# Patient Record
Sex: Female | Born: 1935
Health system: Southern US, Community
[De-identification: ages and names within clinical notes are randomized; demographics above are authoritative.]

## PROBLEM LIST (undated history)

## (undated) DIAGNOSIS — I2510 Atherosclerotic heart disease of native coronary artery without angina pectoris: Secondary | ICD-10-CM

## (undated) DIAGNOSIS — G43909 Migraine, unspecified, not intractable, without status migrainosus: Secondary | ICD-10-CM

## (undated) DIAGNOSIS — I498 Other specified cardiac arrhythmias: Secondary | ICD-10-CM

## (undated) DIAGNOSIS — R011 Cardiac murmur, unspecified: Secondary | ICD-10-CM

## (undated) DIAGNOSIS — F329 Major depressive disorder, single episode, unspecified: Secondary | ICD-10-CM

## (undated) DIAGNOSIS — K5730 Diverticulosis of large intestine without perforation or abscess without bleeding: Secondary | ICD-10-CM

## (undated) DIAGNOSIS — I10 Essential (primary) hypertension: Secondary | ICD-10-CM

## (undated) DIAGNOSIS — F32A Depression, unspecified: Secondary | ICD-10-CM

## (undated) DIAGNOSIS — I130 Hypertensive heart and chronic kidney disease with heart failure and stage 1 through stage 4 chronic kidney disease, or unspecified chronic kidney disease: Secondary | ICD-10-CM

## (undated) DIAGNOSIS — E039 Hypothyroidism, unspecified: Secondary | ICD-10-CM

## (undated) DIAGNOSIS — R0602 Shortness of breath: Secondary | ICD-10-CM

## (undated) DIAGNOSIS — E785 Hyperlipidemia, unspecified: Secondary | ICD-10-CM

## (undated) DIAGNOSIS — M1990 Unspecified osteoarthritis, unspecified site: Secondary | ICD-10-CM

## (undated) DIAGNOSIS — N183 Chronic kidney disease, stage 3 unspecified: Secondary | ICD-10-CM

## (undated) DIAGNOSIS — D649 Anemia, unspecified: Secondary | ICD-10-CM

## (undated) DIAGNOSIS — Z5189 Encounter for other specified aftercare: Secondary | ICD-10-CM

## (undated) DIAGNOSIS — K219 Gastro-esophageal reflux disease without esophagitis: Secondary | ICD-10-CM

## (undated) DIAGNOSIS — M353 Polymyalgia rheumatica: Secondary | ICD-10-CM

## (undated) DIAGNOSIS — I6529 Occlusion and stenosis of unspecified carotid artery: Secondary | ICD-10-CM

## (undated) DIAGNOSIS — I209 Angina pectoris, unspecified: Secondary | ICD-10-CM

## (undated) DIAGNOSIS — Z8719 Personal history of other diseases of the digestive system: Secondary | ICD-10-CM

## (undated) DIAGNOSIS — I5030 Unspecified diastolic (congestive) heart failure: Secondary | ICD-10-CM

## (undated) DIAGNOSIS — E119 Type 2 diabetes mellitus without complications: Secondary | ICD-10-CM

## (undated) HISTORY — DX: Anemia, unspecified: D64.9

## (undated) HISTORY — DX: Cardiac murmur, unspecified: R01.1

## (undated) HISTORY — DX: Atherosclerotic heart disease of native coronary artery without angina pectoris: I25.10

## (undated) HISTORY — DX: Occlusion and stenosis of unspecified carotid artery: I65.29

## (undated) HISTORY — PX: CATARACT EXTRACTION W/ INTRAOCULAR LENS  IMPLANT, BILATERAL: SHX1307

## (undated) HISTORY — DX: Other specified cardiac arrhythmias: I49.8

## (undated) HISTORY — DX: Gastro-esophageal reflux disease without esophagitis: K21.9

## (undated) HISTORY — DX: Hypothyroidism, unspecified: E03.9

## (undated) HISTORY — DX: Diverticulosis of large intestine without perforation or abscess without bleeding: K57.30

## (undated) HISTORY — DX: Essential (primary) hypertension: I10

## (undated) HISTORY — DX: Hyperlipidemia, unspecified: E78.5

---

## 1969-07-19 DIAGNOSIS — Z8711 Personal history of peptic ulcer disease: Secondary | ICD-10-CM

## 1969-07-19 HISTORY — DX: Personal history of peptic ulcer disease: Z87.11

## 1970-11-18 DIAGNOSIS — IMO0001 Reserved for inherently not codable concepts without codable children: Secondary | ICD-10-CM

## 1970-11-18 DIAGNOSIS — Z5189 Encounter for other specified aftercare: Secondary | ICD-10-CM

## 1970-11-18 HISTORY — DX: Reserved for inherently not codable concepts without codable children: IMO0001

## 1970-11-18 HISTORY — DX: Encounter for other specified aftercare: Z51.89

## 1974-11-18 HISTORY — PX: VAGINAL HYSTERECTOMY: SUR661

## 1998-03-21 ENCOUNTER — Encounter: Admission: RE | Admit: 1998-03-21 | Discharge: 1998-03-21 | Payer: Self-pay | Admitting: Hematology and Oncology

## 1998-04-24 ENCOUNTER — Ambulatory Visit: Admission: RE | Admit: 1998-04-24 | Discharge: 1998-04-24 | Payer: Self-pay | Admitting: *Deleted

## 1998-06-21 ENCOUNTER — Encounter: Admission: RE | Admit: 1998-06-21 | Discharge: 1998-06-21 | Payer: Self-pay | Admitting: Hematology and Oncology

## 1998-07-21 ENCOUNTER — Encounter: Admission: RE | Admit: 1998-07-21 | Discharge: 1998-07-21 | Payer: Self-pay | Admitting: Internal Medicine

## 1998-07-28 ENCOUNTER — Encounter: Admission: RE | Admit: 1998-07-28 | Discharge: 1998-07-28 | Payer: Self-pay | Admitting: Internal Medicine

## 1999-06-08 ENCOUNTER — Encounter: Admission: RE | Admit: 1999-06-08 | Discharge: 1999-06-08 | Payer: Self-pay | Admitting: Internal Medicine

## 1999-07-13 ENCOUNTER — Encounter: Admission: RE | Admit: 1999-07-13 | Discharge: 1999-07-13 | Payer: Self-pay | Admitting: Internal Medicine

## 1999-08-22 ENCOUNTER — Encounter: Admission: RE | Admit: 1999-08-22 | Discharge: 1999-08-22 | Payer: Self-pay | Admitting: Hematology and Oncology

## 1999-09-14 ENCOUNTER — Encounter: Admission: RE | Admit: 1999-09-14 | Discharge: 1999-09-14 | Payer: Self-pay | Admitting: Internal Medicine

## 2000-01-08 ENCOUNTER — Encounter: Admission: RE | Admit: 2000-01-08 | Discharge: 2000-01-08 | Payer: Self-pay | Admitting: Internal Medicine

## 2000-01-08 ENCOUNTER — Ambulatory Visit (HOSPITAL_COMMUNITY): Admission: RE | Admit: 2000-01-08 | Discharge: 2000-01-08 | Payer: Self-pay | Admitting: Internal Medicine

## 2000-04-17 ENCOUNTER — Encounter: Admission: RE | Admit: 2000-04-17 | Discharge: 2000-04-17 | Payer: Self-pay | Admitting: Internal Medicine

## 2000-04-18 ENCOUNTER — Ambulatory Visit (HOSPITAL_COMMUNITY): Admission: RE | Admit: 2000-04-18 | Discharge: 2000-04-18 | Payer: Self-pay | Admitting: *Deleted

## 2000-04-18 ENCOUNTER — Encounter: Payer: Self-pay | Admitting: *Deleted

## 2000-05-28 ENCOUNTER — Encounter: Admission: RE | Admit: 2000-05-28 | Discharge: 2000-05-28 | Payer: Self-pay | Admitting: Hematology and Oncology

## 2000-06-05 ENCOUNTER — Encounter: Admission: RE | Admit: 2000-06-05 | Discharge: 2000-06-05 | Payer: Self-pay | Admitting: Hematology and Oncology

## 2000-06-16 ENCOUNTER — Emergency Department (HOSPITAL_COMMUNITY): Admission: EM | Admit: 2000-06-16 | Discharge: 2000-06-16 | Payer: Self-pay | Admitting: Emergency Medicine

## 2000-06-17 ENCOUNTER — Encounter: Payer: Self-pay | Admitting: Emergency Medicine

## 2000-06-17 ENCOUNTER — Ambulatory Visit (HOSPITAL_COMMUNITY): Admission: RE | Admit: 2000-06-17 | Discharge: 2000-06-17 | Payer: Self-pay | Admitting: Emergency Medicine

## 2000-06-20 ENCOUNTER — Encounter: Admission: RE | Admit: 2000-06-20 | Discharge: 2000-06-20 | Payer: Self-pay | Admitting: Internal Medicine

## 2000-06-23 ENCOUNTER — Encounter: Payer: Self-pay | Admitting: Internal Medicine

## 2000-06-23 ENCOUNTER — Encounter: Admission: RE | Admit: 2000-06-23 | Discharge: 2000-06-23 | Payer: Self-pay | Admitting: Internal Medicine

## 2000-06-23 ENCOUNTER — Ambulatory Visit (HOSPITAL_COMMUNITY): Admission: RE | Admit: 2000-06-23 | Discharge: 2000-06-23 | Payer: Self-pay | Admitting: Internal Medicine

## 2000-07-09 ENCOUNTER — Encounter: Admission: RE | Admit: 2000-07-09 | Discharge: 2000-07-09 | Payer: Self-pay | Admitting: Hematology and Oncology

## 2000-08-26 ENCOUNTER — Encounter (INDEPENDENT_AMBULATORY_CARE_PROVIDER_SITE_OTHER): Payer: Self-pay | Admitting: Specialist

## 2000-08-26 ENCOUNTER — Other Ambulatory Visit: Admission: RE | Admit: 2000-08-26 | Discharge: 2000-08-26 | Payer: Self-pay | Admitting: Gastroenterology

## 2000-08-28 ENCOUNTER — Encounter: Admission: RE | Admit: 2000-08-28 | Discharge: 2000-08-28 | Payer: Self-pay | Admitting: Internal Medicine

## 2001-01-23 ENCOUNTER — Encounter: Admission: RE | Admit: 2001-01-23 | Discharge: 2001-01-23 | Payer: Self-pay | Admitting: Internal Medicine

## 2001-02-09 ENCOUNTER — Encounter: Admission: RE | Admit: 2001-02-09 | Discharge: 2001-02-09 | Payer: Self-pay

## 2001-02-19 ENCOUNTER — Encounter: Admission: RE | Admit: 2001-02-19 | Discharge: 2001-02-19 | Payer: Self-pay | Admitting: Internal Medicine

## 2001-02-25 ENCOUNTER — Ambulatory Visit (HOSPITAL_COMMUNITY): Admission: RE | Admit: 2001-02-25 | Discharge: 2001-02-25 | Payer: Self-pay

## 2001-02-27 ENCOUNTER — Encounter: Admission: RE | Admit: 2001-02-27 | Discharge: 2001-02-27 | Payer: Self-pay

## 2001-08-12 ENCOUNTER — Encounter: Admission: RE | Admit: 2001-08-12 | Discharge: 2001-08-12 | Payer: Self-pay | Admitting: Internal Medicine

## 2001-08-21 ENCOUNTER — Encounter: Payer: Self-pay | Admitting: Internal Medicine

## 2001-08-21 ENCOUNTER — Ambulatory Visit (HOSPITAL_COMMUNITY): Admission: RE | Admit: 2001-08-21 | Discharge: 2001-08-21 | Payer: Self-pay | Admitting: Internal Medicine

## 2001-09-15 ENCOUNTER — Encounter: Admission: RE | Admit: 2001-09-15 | Discharge: 2001-09-15 | Payer: Self-pay | Admitting: Obstetrics & Gynecology

## 2001-10-06 ENCOUNTER — Encounter: Admission: RE | Admit: 2001-10-06 | Discharge: 2001-10-06 | Payer: Self-pay | Admitting: Obstetrics & Gynecology

## 2002-02-19 ENCOUNTER — Encounter: Admission: RE | Admit: 2002-02-19 | Discharge: 2002-02-19 | Payer: Self-pay | Admitting: Internal Medicine

## 2002-04-01 ENCOUNTER — Encounter: Admission: RE | Admit: 2002-04-01 | Discharge: 2002-04-01 | Payer: Self-pay | Admitting: Internal Medicine

## 2002-04-07 ENCOUNTER — Encounter: Admission: RE | Admit: 2002-04-07 | Discharge: 2002-04-07 | Payer: Self-pay | Admitting: Internal Medicine

## 2002-04-22 ENCOUNTER — Encounter: Admission: RE | Admit: 2002-04-22 | Discharge: 2002-04-22 | Payer: Self-pay | Admitting: Internal Medicine

## 2002-05-19 ENCOUNTER — Encounter: Admission: RE | Admit: 2002-05-19 | Discharge: 2002-05-19 | Payer: Self-pay | Admitting: Internal Medicine

## 2002-10-07 ENCOUNTER — Encounter: Admission: RE | Admit: 2002-10-07 | Discharge: 2002-10-07 | Payer: Self-pay | Admitting: Internal Medicine

## 2002-10-07 ENCOUNTER — Ambulatory Visit (HOSPITAL_COMMUNITY): Admission: RE | Admit: 2002-10-07 | Discharge: 2002-10-07 | Payer: Self-pay | Admitting: Internal Medicine

## 2002-11-01 ENCOUNTER — Encounter: Payer: Self-pay | Admitting: Internal Medicine

## 2002-11-22 ENCOUNTER — Emergency Department (HOSPITAL_COMMUNITY): Admission: EM | Admit: 2002-11-22 | Discharge: 2002-11-23 | Payer: Self-pay | Admitting: Emergency Medicine

## 2002-11-26 ENCOUNTER — Encounter: Admission: RE | Admit: 2002-11-26 | Discharge: 2002-11-26 | Payer: Self-pay | Admitting: Internal Medicine

## 2002-12-07 ENCOUNTER — Encounter: Admission: RE | Admit: 2002-12-07 | Discharge: 2002-12-07 | Payer: Self-pay | Admitting: Internal Medicine

## 2002-12-27 ENCOUNTER — Encounter: Admission: RE | Admit: 2002-12-27 | Discharge: 2002-12-27 | Payer: Self-pay | Admitting: Internal Medicine

## 2003-01-10 ENCOUNTER — Encounter: Admission: RE | Admit: 2003-01-10 | Discharge: 2003-01-10 | Payer: Self-pay | Admitting: Internal Medicine

## 2003-01-24 ENCOUNTER — Encounter: Admission: RE | Admit: 2003-01-24 | Discharge: 2003-01-24 | Payer: Self-pay | Admitting: Internal Medicine

## 2003-01-31 ENCOUNTER — Ambulatory Visit (HOSPITAL_COMMUNITY): Admission: RE | Admit: 2003-01-31 | Discharge: 2003-01-31 | Payer: Self-pay | Admitting: Internal Medicine

## 2003-02-02 ENCOUNTER — Encounter: Admission: RE | Admit: 2003-02-02 | Discharge: 2003-02-02 | Payer: Self-pay | Admitting: Internal Medicine

## 2003-02-23 ENCOUNTER — Encounter: Payer: Self-pay | Admitting: Internal Medicine

## 2003-02-23 ENCOUNTER — Ambulatory Visit (HOSPITAL_COMMUNITY): Admission: RE | Admit: 2003-02-23 | Discharge: 2003-02-23 | Payer: Self-pay | Admitting: Internal Medicine

## 2003-03-02 ENCOUNTER — Encounter: Admission: RE | Admit: 2003-03-02 | Discharge: 2003-03-02 | Payer: Self-pay | Admitting: Internal Medicine

## 2003-03-03 ENCOUNTER — Encounter: Admission: RE | Admit: 2003-03-03 | Discharge: 2003-03-03 | Payer: Self-pay | Admitting: Infectious Diseases

## 2003-03-10 ENCOUNTER — Encounter: Admission: RE | Admit: 2003-03-10 | Discharge: 2003-03-10 | Payer: Self-pay | Admitting: Internal Medicine

## 2003-03-17 ENCOUNTER — Encounter: Admission: RE | Admit: 2003-03-17 | Discharge: 2003-03-17 | Payer: Self-pay | Admitting: Internal Medicine

## 2003-03-24 ENCOUNTER — Encounter: Admission: RE | Admit: 2003-03-24 | Discharge: 2003-03-24 | Payer: Self-pay | Admitting: Internal Medicine

## 2003-03-30 ENCOUNTER — Encounter: Payer: Self-pay | Admitting: Internal Medicine

## 2003-03-30 ENCOUNTER — Ambulatory Visit (HOSPITAL_COMMUNITY): Admission: RE | Admit: 2003-03-30 | Discharge: 2003-03-30 | Payer: Self-pay | Admitting: Internal Medicine

## 2003-05-02 ENCOUNTER — Encounter: Admission: RE | Admit: 2003-05-02 | Discharge: 2003-05-02 | Payer: Self-pay | Admitting: Internal Medicine

## 2003-05-18 ENCOUNTER — Encounter: Admission: RE | Admit: 2003-05-18 | Discharge: 2003-05-18 | Payer: Self-pay | Admitting: Internal Medicine

## 2003-06-27 ENCOUNTER — Ambulatory Visit (HOSPITAL_COMMUNITY): Admission: RE | Admit: 2003-06-27 | Discharge: 2003-06-27 | Payer: Self-pay | Admitting: Specialist

## 2003-07-11 ENCOUNTER — Ambulatory Visit (HOSPITAL_COMMUNITY): Admission: RE | Admit: 2003-07-11 | Discharge: 2003-07-11 | Payer: Self-pay | Admitting: Specialist

## 2003-07-26 ENCOUNTER — Encounter: Admission: RE | Admit: 2003-07-26 | Discharge: 2003-07-26 | Payer: Self-pay | Admitting: Internal Medicine

## 2003-12-01 ENCOUNTER — Encounter: Admission: RE | Admit: 2003-12-01 | Discharge: 2003-12-01 | Payer: Self-pay | Admitting: Internal Medicine

## 2004-03-28 ENCOUNTER — Encounter: Admission: RE | Admit: 2004-03-28 | Discharge: 2004-03-28 | Payer: Self-pay | Admitting: Internal Medicine

## 2004-06-01 ENCOUNTER — Encounter: Admission: RE | Admit: 2004-06-01 | Discharge: 2004-06-01 | Payer: Self-pay | Admitting: Internal Medicine

## 2004-06-26 ENCOUNTER — Encounter: Admission: RE | Admit: 2004-06-26 | Discharge: 2004-06-26 | Payer: Self-pay | Admitting: Internal Medicine

## 2004-07-03 ENCOUNTER — Encounter: Admission: RE | Admit: 2004-07-03 | Discharge: 2004-07-03 | Payer: Self-pay | Admitting: Internal Medicine

## 2004-07-24 ENCOUNTER — Ambulatory Visit: Payer: Self-pay | Admitting: Internal Medicine

## 2004-08-07 ENCOUNTER — Ambulatory Visit: Payer: Self-pay | Admitting: Internal Medicine

## 2004-09-20 ENCOUNTER — Ambulatory Visit: Payer: Self-pay | Admitting: Internal Medicine

## 2004-10-25 ENCOUNTER — Ambulatory Visit: Payer: Self-pay | Admitting: Internal Medicine

## 2005-01-08 ENCOUNTER — Ambulatory Visit: Payer: Self-pay | Admitting: Internal Medicine

## 2005-01-30 ENCOUNTER — Ambulatory Visit (HOSPITAL_COMMUNITY): Admission: RE | Admit: 2005-01-30 | Discharge: 2005-01-30 | Payer: Self-pay | Admitting: Internal Medicine

## 2005-01-30 ENCOUNTER — Ambulatory Visit: Payer: Self-pay | Admitting: Internal Medicine

## 2005-06-19 ENCOUNTER — Ambulatory Visit: Payer: Self-pay | Admitting: Internal Medicine

## 2005-06-27 ENCOUNTER — Ambulatory Visit: Payer: Self-pay | Admitting: Internal Medicine

## 2005-06-27 ENCOUNTER — Ambulatory Visit (HOSPITAL_COMMUNITY): Admission: RE | Admit: 2005-06-27 | Discharge: 2005-06-27 | Payer: Self-pay | Admitting: Internal Medicine

## 2005-07-11 ENCOUNTER — Ambulatory Visit: Payer: Self-pay | Admitting: Internal Medicine

## 2005-07-26 ENCOUNTER — Ambulatory Visit: Payer: Self-pay | Admitting: Internal Medicine

## 2005-07-30 ENCOUNTER — Ambulatory Visit: Payer: Self-pay | Admitting: Internal Medicine

## 2005-09-05 ENCOUNTER — Ambulatory Visit: Payer: Self-pay | Admitting: Internal Medicine

## 2005-10-25 ENCOUNTER — Ambulatory Visit: Payer: Self-pay | Admitting: Internal Medicine

## 2005-11-07 ENCOUNTER — Ambulatory Visit: Payer: Self-pay | Admitting: Internal Medicine

## 2006-04-08 ENCOUNTER — Ambulatory Visit: Payer: Self-pay | Admitting: Internal Medicine

## 2006-04-19 ENCOUNTER — Ambulatory Visit (HOSPITAL_COMMUNITY): Admission: RE | Admit: 2006-04-19 | Discharge: 2006-04-19 | Payer: Self-pay | Admitting: Internal Medicine

## 2006-04-29 ENCOUNTER — Ambulatory Visit: Payer: Self-pay | Admitting: Internal Medicine

## 2006-06-10 ENCOUNTER — Ambulatory Visit: Payer: Self-pay | Admitting: Hospitalist

## 2006-06-17 ENCOUNTER — Ambulatory Visit: Payer: Self-pay | Admitting: Internal Medicine

## 2006-09-04 DIAGNOSIS — E119 Type 2 diabetes mellitus without complications: Secondary | ICD-10-CM | POA: Insufficient documentation

## 2006-09-04 DIAGNOSIS — G43909 Migraine, unspecified, not intractable, without status migrainosus: Secondary | ICD-10-CM | POA: Insufficient documentation

## 2006-09-04 DIAGNOSIS — I10 Essential (primary) hypertension: Secondary | ICD-10-CM | POA: Insufficient documentation

## 2006-09-04 DIAGNOSIS — E039 Hypothyroidism, unspecified: Secondary | ICD-10-CM | POA: Insufficient documentation

## 2006-09-04 DIAGNOSIS — Z8669 Personal history of other diseases of the nervous system and sense organs: Secondary | ICD-10-CM | POA: Insufficient documentation

## 2006-09-04 DIAGNOSIS — K219 Gastro-esophageal reflux disease without esophagitis: Secondary | ICD-10-CM | POA: Insufficient documentation

## 2006-09-04 DIAGNOSIS — M353 Polymyalgia rheumatica: Secondary | ICD-10-CM | POA: Insufficient documentation

## 2006-09-04 DIAGNOSIS — I152 Hypertension secondary to endocrine disorders: Secondary | ICD-10-CM

## 2006-09-04 HISTORY — DX: Hypertension secondary to endocrine disorders: I15.2

## 2006-09-04 HISTORY — DX: Personal history of other diseases of the nervous system and sense organs: Z86.69

## 2006-09-16 ENCOUNTER — Encounter (INDEPENDENT_AMBULATORY_CARE_PROVIDER_SITE_OTHER): Payer: Self-pay | Admitting: Infectious Diseases

## 2006-09-16 ENCOUNTER — Ambulatory Visit: Payer: Self-pay | Admitting: Internal Medicine

## 2006-09-16 LAB — CONVERTED CEMR LAB
ALT: 14 units/L (ref 0–40)
AST: 18 units/L (ref 0–37)
BUN: 22 mg/dL (ref 6–23)
CO2: 27 meq/L (ref 19–32)
Calcium: 10 mg/dL (ref 8.4–10.5)
Creatinine, Ser: 0.9 mg/dL (ref 0.40–1.20)
Total Bilirubin: 0.5 mg/dL (ref 0.3–1.2)

## 2006-09-30 ENCOUNTER — Ambulatory Visit: Payer: Self-pay | Admitting: Internal Medicine

## 2006-10-21 ENCOUNTER — Encounter (INDEPENDENT_AMBULATORY_CARE_PROVIDER_SITE_OTHER): Payer: Self-pay | Admitting: Internal Medicine

## 2006-10-21 ENCOUNTER — Ambulatory Visit (HOSPITAL_COMMUNITY): Admission: RE | Admit: 2006-10-21 | Discharge: 2006-10-21 | Payer: Self-pay | Admitting: *Deleted

## 2006-10-21 ENCOUNTER — Ambulatory Visit: Payer: Self-pay | Admitting: *Deleted

## 2006-10-21 LAB — CONVERTED CEMR LAB
ALT: 12 units/L (ref 0–35)
AST: 17 units/L (ref 0–37)
Albumin: 3.9 g/dL (ref 3.5–5.2)
BUN: 24 mg/dL — ABNORMAL HIGH (ref 6–23)
Bilirubin Urine: NEGATIVE
Chloride: 102 meq/L (ref 96–112)
Creatinine, Ser: 1.2 mg/dL (ref 0.40–1.20)
Glucose, Bld: 113 mg/dL — ABNORMAL HIGH (ref 70–99)
HCT: 37.7 % (ref 34.4–43.3)
Hemoglobin, Urine: NEGATIVE
Hemoglobin: 12.8 g/dL (ref 11.7–14.8)
Ketones, ur: NEGATIVE mg/dL
MCV: 88.1 fL (ref 78.8–100.0)
Nitrite: NEGATIVE
Platelets: 360 10*3/uL (ref 152–374)
Potassium: 3.6 meq/L (ref 3.5–5.3)
Protein, ur: NEGATIVE mg/dL
Specific Gravity, Urine: 1.022 (ref 1.005–1.03)
TSH: 0.929 microintl units/mL (ref 0.350–5.50)
Total Bilirubin: 0.7 mg/dL (ref 0.3–1.2)
Total Protein: 7.6 g/dL (ref 6.0–8.3)
Urine Glucose: NEGATIVE mg/dL
Vitamin B-12: 1229 pg/mL — ABNORMAL HIGH (ref 211–911)
WBC: 6.4 10*3/uL (ref 3.7–10.0)
pH: 5.5 (ref 5.0–8.0)

## 2006-10-27 ENCOUNTER — Ambulatory Visit: Payer: Self-pay | Admitting: Internal Medicine

## 2006-11-28 DIAGNOSIS — E785 Hyperlipidemia, unspecified: Secondary | ICD-10-CM | POA: Insufficient documentation

## 2006-12-08 ENCOUNTER — Ambulatory Visit (HOSPITAL_COMMUNITY): Admission: RE | Admit: 2006-12-08 | Discharge: 2006-12-08 | Payer: Self-pay | Admitting: Internal Medicine

## 2006-12-15 ENCOUNTER — Ambulatory Visit: Payer: Self-pay | Admitting: Internal Medicine

## 2006-12-15 DIAGNOSIS — R911 Solitary pulmonary nodule: Secondary | ICD-10-CM | POA: Insufficient documentation

## 2006-12-15 HISTORY — DX: Solitary pulmonary nodule: R91.1

## 2006-12-15 LAB — CONVERTED CEMR LAB: Hgb A1c MFr Bld: 6.1 %

## 2007-01-01 ENCOUNTER — Encounter (INDEPENDENT_AMBULATORY_CARE_PROVIDER_SITE_OTHER): Payer: Self-pay | Admitting: Internal Medicine

## 2007-03-25 ENCOUNTER — Ambulatory Visit: Payer: Self-pay | Admitting: Internal Medicine

## 2007-03-25 ENCOUNTER — Encounter (INDEPENDENT_AMBULATORY_CARE_PROVIDER_SITE_OTHER): Payer: Self-pay | Admitting: Pulmonary Disease

## 2007-03-25 LAB — CONVERTED CEMR LAB: Hgb A1c MFr Bld: 6.6 %

## 2007-03-26 LAB — CONVERTED CEMR LAB
Calcium: 9.6 mg/dL (ref 8.4–10.5)
Glucose, Bld: 78 mg/dL (ref 70–99)
Sodium: 137 meq/L (ref 135–145)

## 2007-04-09 ENCOUNTER — Encounter (INDEPENDENT_AMBULATORY_CARE_PROVIDER_SITE_OTHER): Payer: Self-pay | Admitting: Pulmonary Disease

## 2007-04-09 ENCOUNTER — Ambulatory Visit: Payer: Self-pay | Admitting: Internal Medicine

## 2007-04-09 LAB — CONVERTED CEMR LAB
BUN: 19 mg/dL (ref 6–23)
Blood Glucose, Fingerstick: 82
Calcium: 9.4 mg/dL (ref 8.4–10.5)
Chloride: 102 meq/L (ref 96–112)
Glucose, Bld: 69 mg/dL — ABNORMAL LOW (ref 70–99)

## 2007-04-28 ENCOUNTER — Telehealth (INDEPENDENT_AMBULATORY_CARE_PROVIDER_SITE_OTHER): Payer: Self-pay | Admitting: *Deleted

## 2007-05-04 ENCOUNTER — Telehealth (INDEPENDENT_AMBULATORY_CARE_PROVIDER_SITE_OTHER): Payer: Self-pay | Admitting: *Deleted

## 2007-05-08 ENCOUNTER — Telehealth (INDEPENDENT_AMBULATORY_CARE_PROVIDER_SITE_OTHER): Payer: Self-pay | Admitting: Pharmacy Technician

## 2007-05-19 ENCOUNTER — Telehealth: Payer: Self-pay | Admitting: *Deleted

## 2007-06-23 ENCOUNTER — Telehealth: Payer: Self-pay | Admitting: *Deleted

## 2007-07-14 ENCOUNTER — Telehealth: Payer: Self-pay | Admitting: Internal Medicine

## 2007-08-18 ENCOUNTER — Ambulatory Visit: Payer: Self-pay | Admitting: Internal Medicine

## 2007-08-18 ENCOUNTER — Encounter (INDEPENDENT_AMBULATORY_CARE_PROVIDER_SITE_OTHER): Payer: Self-pay | Admitting: Internal Medicine

## 2007-08-18 LAB — CONVERTED CEMR LAB
Albumin: 4.5 g/dL (ref 3.5–5.2)
Basophils Relative: 1 % (ref 0–1)
Blood Glucose, AC Bkfst: 102 mg/dL
CO2: 26 meq/L (ref 19–32)
Calcium: 9.9 mg/dL (ref 8.4–10.5)
Chloride: 102 meq/L (ref 96–112)
Creatinine, Ser: 0.97 mg/dL (ref 0.40–1.20)
Eosinophils Relative: 3 % (ref 0–5)
Hemoglobin: 12.5 g/dL (ref 12.0–15.0)
Lymphocytes Relative: 38 % (ref 12–46)
MCV: 89.3 fL (ref 78.0–100.0)
Monocytes Absolute: 0.4 10*3/uL (ref 0.2–0.7)
Monocytes Relative: 7 % (ref 3–11)
Neutro Abs: 3 10*3/uL (ref 1.7–7.7)
Neutrophils Relative %: 50 % (ref 43–77)
Platelets: 329 10*3/uL (ref 150–400)
Potassium: 4.4 meq/L (ref 3.5–5.3)
Sodium: 139 meq/L (ref 135–145)
Total Bilirubin: 0.4 mg/dL (ref 0.3–1.2)
Total Protein: 7.7 g/dL (ref 6.0–8.3)
WBC: 5.9 10*3/uL (ref 4.0–10.5)

## 2007-09-16 ENCOUNTER — Encounter (INDEPENDENT_AMBULATORY_CARE_PROVIDER_SITE_OTHER): Payer: Self-pay | Admitting: *Deleted

## 2007-09-16 ENCOUNTER — Ambulatory Visit: Payer: Self-pay | Admitting: Infectious Diseases

## 2007-10-06 ENCOUNTER — Telehealth (INDEPENDENT_AMBULATORY_CARE_PROVIDER_SITE_OTHER): Payer: Self-pay | Admitting: Internal Medicine

## 2007-10-30 ENCOUNTER — Encounter (INDEPENDENT_AMBULATORY_CARE_PROVIDER_SITE_OTHER): Payer: Self-pay | Admitting: Internal Medicine

## 2007-12-18 ENCOUNTER — Ambulatory Visit: Payer: Self-pay | Admitting: Infectious Disease

## 2007-12-18 ENCOUNTER — Encounter (INDEPENDENT_AMBULATORY_CARE_PROVIDER_SITE_OTHER): Payer: Self-pay | Admitting: Infectious Diseases

## 2007-12-18 ENCOUNTER — Encounter (INDEPENDENT_AMBULATORY_CARE_PROVIDER_SITE_OTHER): Payer: Self-pay | Admitting: Internal Medicine

## 2007-12-18 ENCOUNTER — Telehealth (INDEPENDENT_AMBULATORY_CARE_PROVIDER_SITE_OTHER): Payer: Self-pay | Admitting: Infectious Diseases

## 2007-12-18 LAB — CONVERTED CEMR LAB
BUN: 25 mg/dL — ABNORMAL HIGH (ref 6–23)
CO2: 26 meq/L (ref 19–32)
Sed Rate: 33 mm/hr — ABNORMAL HIGH (ref 0–22)
Sodium: 138 meq/L (ref 135–145)

## 2007-12-22 ENCOUNTER — Telehealth (INDEPENDENT_AMBULATORY_CARE_PROVIDER_SITE_OTHER): Payer: Self-pay | Admitting: Internal Medicine

## 2007-12-23 ENCOUNTER — Ambulatory Visit: Payer: Self-pay | Admitting: Internal Medicine

## 2008-01-25 ENCOUNTER — Telehealth (INDEPENDENT_AMBULATORY_CARE_PROVIDER_SITE_OTHER): Payer: Self-pay | Admitting: Internal Medicine

## 2008-02-15 ENCOUNTER — Telehealth (INDEPENDENT_AMBULATORY_CARE_PROVIDER_SITE_OTHER): Payer: Self-pay | Admitting: Internal Medicine

## 2008-03-09 ENCOUNTER — Encounter (INDEPENDENT_AMBULATORY_CARE_PROVIDER_SITE_OTHER): Payer: Self-pay | Admitting: Internal Medicine

## 2008-03-09 ENCOUNTER — Ambulatory Visit (HOSPITAL_COMMUNITY): Admission: RE | Admit: 2008-03-09 | Discharge: 2008-03-09 | Payer: Self-pay | Admitting: Internal Medicine

## 2008-03-30 ENCOUNTER — Ambulatory Visit (HOSPITAL_COMMUNITY): Admission: RE | Admit: 2008-03-30 | Discharge: 2008-03-30 | Payer: Self-pay | Admitting: Infectious Disease

## 2008-03-30 ENCOUNTER — Encounter (INDEPENDENT_AMBULATORY_CARE_PROVIDER_SITE_OTHER): Payer: Self-pay | Admitting: Internal Medicine

## 2008-03-30 ENCOUNTER — Ambulatory Visit: Payer: Self-pay | Admitting: Infectious Disease

## 2008-03-30 DIAGNOSIS — M89319 Hypertrophy of bone, unspecified shoulder: Secondary | ICD-10-CM | POA: Insufficient documentation

## 2008-03-30 LAB — CONVERTED CEMR LAB
Basophils Absolute: 0 10*3/uL (ref 0.0–0.1)
Blood Glucose, Fingerstick: 117
CO2: 24 meq/L (ref 19–32)
Calcium: 9.6 mg/dL (ref 8.4–10.5)
Creatinine, Ser: 1.05 mg/dL (ref 0.40–1.20)
HCT: 38.5 % (ref 36.0–46.0)
Hemoglobin: 12.3 g/dL (ref 12.0–15.0)
Lymphocytes Relative: 31 % (ref 12–46)
Lymphs Abs: 2 10*3/uL (ref 0.7–4.0)
MCV: 89.1 fL (ref 78.0–100.0)
Monocytes Absolute: 0.6 10*3/uL (ref 0.1–1.0)
Monocytes Relative: 9 % (ref 3–12)
Neutro Abs: 3.6 10*3/uL (ref 1.7–7.7)
Neutrophils Relative %: 56 % (ref 43–77)
Platelets: 327 10*3/uL (ref 150–400)
RBC: 4.32 M/uL (ref 3.87–5.11)
RDW: 14.4 % (ref 11.5–15.5)
Total Bilirubin: 0.8 mg/dL (ref 0.3–1.2)
WBC: 6.3 10*3/uL (ref 4.0–10.5)

## 2008-04-01 ENCOUNTER — Ambulatory Visit (HOSPITAL_COMMUNITY): Admission: RE | Admit: 2008-04-01 | Discharge: 2008-04-01 | Payer: Self-pay | Admitting: Internal Medicine

## 2008-04-05 ENCOUNTER — Ambulatory Visit: Payer: Self-pay | Admitting: Oncology

## 2008-04-14 ENCOUNTER — Ambulatory Visit: Payer: Self-pay | Admitting: Internal Medicine

## 2008-04-14 ENCOUNTER — Encounter (INDEPENDENT_AMBULATORY_CARE_PROVIDER_SITE_OTHER): Payer: Self-pay | Admitting: Internal Medicine

## 2008-04-14 LAB — CONVERTED CEMR LAB
Creatinine, Urine: 132.6 mg/dL
Microalb Creat Ratio: 5.8 mg/g (ref 0.0–30.0)

## 2008-04-19 ENCOUNTER — Encounter: Admission: RE | Admit: 2008-04-19 | Discharge: 2008-04-19 | Payer: Self-pay | Admitting: Internal Medicine

## 2008-04-25 ENCOUNTER — Encounter (INDEPENDENT_AMBULATORY_CARE_PROVIDER_SITE_OTHER): Payer: Self-pay | Admitting: Internal Medicine

## 2008-04-29 ENCOUNTER — Ambulatory Visit: Payer: Self-pay | Admitting: Internal Medicine

## 2008-05-09 ENCOUNTER — Encounter (INDEPENDENT_AMBULATORY_CARE_PROVIDER_SITE_OTHER): Payer: Self-pay | Admitting: Radiology

## 2008-05-09 ENCOUNTER — Encounter: Admission: RE | Admit: 2008-05-09 | Discharge: 2008-05-09 | Payer: Self-pay | Admitting: Internal Medicine

## 2008-05-09 ENCOUNTER — Other Ambulatory Visit: Admission: RE | Admit: 2008-05-09 | Discharge: 2008-05-09 | Payer: Self-pay | Admitting: Radiology

## 2008-05-16 ENCOUNTER — Encounter (INDEPENDENT_AMBULATORY_CARE_PROVIDER_SITE_OTHER): Payer: Self-pay | Admitting: *Deleted

## 2008-05-16 ENCOUNTER — Ambulatory Visit: Payer: Self-pay | Admitting: Internal Medicine

## 2008-05-19 ENCOUNTER — Encounter (INDEPENDENT_AMBULATORY_CARE_PROVIDER_SITE_OTHER): Payer: Self-pay | Admitting: Internal Medicine

## 2008-05-27 ENCOUNTER — Telehealth: Payer: Self-pay | Admitting: *Deleted

## 2008-06-02 ENCOUNTER — Telehealth (INDEPENDENT_AMBULATORY_CARE_PROVIDER_SITE_OTHER): Payer: Self-pay | Admitting: Internal Medicine

## 2008-08-16 ENCOUNTER — Ambulatory Visit: Payer: Self-pay | Admitting: Internal Medicine

## 2008-08-16 ENCOUNTER — Encounter: Payer: Self-pay | Admitting: Internal Medicine

## 2008-08-16 ENCOUNTER — Ambulatory Visit (HOSPITAL_COMMUNITY): Admission: RE | Admit: 2008-08-16 | Discharge: 2008-08-16 | Payer: Self-pay | Admitting: Internal Medicine

## 2008-08-16 LAB — CONVERTED CEMR LAB: Blood Glucose, Fingerstick: 89

## 2008-08-17 LAB — CONVERTED CEMR LAB
ALT: 11 units/L (ref 0–35)
AST: 18 units/L (ref 0–37)
Albumin: 4.5 g/dL (ref 3.5–5.2)
BUN: 18 mg/dL (ref 6–23)
Basophils Absolute: 0 10*3/uL (ref 0.0–0.1)
Calcium: 9.8 mg/dL (ref 8.4–10.5)
Chloride: 100 meq/L (ref 96–112)
HCT: 37.7 % (ref 36.0–46.0)
HDL: 48 mg/dL (ref >39)
LDL Cholesterol: 151 mg/dL — ABNORMAL HIGH (ref 0–99)
Lymphocytes Relative: 34 % (ref 12–46)
Lymphs Abs: 2 10*3/uL (ref 0.7–4.0)
MCHC: 31.3 g/dL (ref 30.0–36.0)
MCV: 90 fL (ref 78.0–100.0)
Monocytes Relative: 9 % (ref 3–12)
Platelets: 381 10*3/uL (ref 150–400)
Potassium: 4.2 meq/L (ref 3.5–5.3)
RBC: 4.19 M/uL (ref 3.87–5.11)
RDW: 15 % (ref 11.5–15.5)
Total Protein: 7.5 g/dL (ref 6.0–8.3)
Triglycerides: 150 mg/dL — ABNORMAL HIGH (ref <150)
WBC: 6 10*3/uL (ref 4.0–10.5)

## 2008-09-13 ENCOUNTER — Ambulatory Visit: Payer: Self-pay | Admitting: Gastroenterology

## 2008-09-27 ENCOUNTER — Telehealth: Payer: Self-pay | Admitting: Gastroenterology

## 2008-09-28 ENCOUNTER — Ambulatory Visit: Payer: Self-pay | Admitting: Gastroenterology

## 2008-09-28 LAB — HM COLONOSCOPY: HM Colonoscopy: NORMAL

## 2008-10-17 ENCOUNTER — Ambulatory Visit: Payer: Self-pay | Admitting: Internal Medicine

## 2008-11-07 ENCOUNTER — Encounter (INDEPENDENT_AMBULATORY_CARE_PROVIDER_SITE_OTHER): Payer: Self-pay | Admitting: Internal Medicine

## 2008-12-02 ENCOUNTER — Telehealth (INDEPENDENT_AMBULATORY_CARE_PROVIDER_SITE_OTHER): Payer: Self-pay | Admitting: *Deleted

## 2008-12-27 ENCOUNTER — Telehealth: Payer: Self-pay | Admitting: *Deleted

## 2008-12-29 ENCOUNTER — Telehealth: Payer: Self-pay | Admitting: *Deleted

## 2008-12-30 ENCOUNTER — Telehealth (INDEPENDENT_AMBULATORY_CARE_PROVIDER_SITE_OTHER): Payer: Self-pay | Admitting: *Deleted

## 2009-01-12 ENCOUNTER — Ambulatory Visit: Payer: Self-pay | Admitting: Internal Medicine

## 2009-01-12 ENCOUNTER — Encounter (INDEPENDENT_AMBULATORY_CARE_PROVIDER_SITE_OTHER): Payer: Self-pay | Admitting: Internal Medicine

## 2009-02-27 ENCOUNTER — Telehealth (INDEPENDENT_AMBULATORY_CARE_PROVIDER_SITE_OTHER): Payer: Self-pay | Admitting: Internal Medicine

## 2009-03-16 ENCOUNTER — Ambulatory Visit: Payer: Self-pay | Admitting: Internal Medicine

## 2009-03-17 LAB — CONVERTED CEMR LAB
BUN: 17 mg/dL (ref 6–23)
Bilirubin Urine: NEGATIVE
Calcium: 9.5 mg/dL (ref 8.4–10.5)
Chloride: 103 meq/L (ref 96–112)
Cholesterol: 129 mg/dL (ref 0–200)
Creatinine, Ser: 0.97 mg/dL (ref 0.40–1.20)
GFR calc Af Amer: >60 mL/min (ref >60)
HDL: 44 mg/dL (ref >39)
Hemoglobin, Urine: NEGATIVE
Ketones, ur: NEGATIVE mg/dL
Leukocytes, UA: NEGATIVE
Nitrite: NEGATIVE
Specific Gravity, Urine: 1.013 (ref 1.005–1.030)
Total CHOL/HDL Ratio: 2.9
Triglycerides: 125 mg/dL (ref <150)

## 2009-05-16 ENCOUNTER — Ambulatory Visit: Payer: Self-pay | Admitting: Internal Medicine

## 2009-05-16 ENCOUNTER — Telehealth: Payer: Self-pay | Admitting: Internal Medicine

## 2009-05-16 ENCOUNTER — Encounter: Payer: Self-pay | Admitting: Internal Medicine

## 2009-05-16 LAB — CONVERTED CEMR LAB
ALT: 20 units/L (ref 0–35)
Alkaline Phosphatase: 112 units/L (ref 39–117)
Blood Glucose, Fingerstick: 173
Calcium: 9.7 mg/dL (ref 8.4–10.5)
Creatinine, Ser: 1.14 mg/dL (ref 0.40–1.20)
Glucose, Bld: 160 mg/dL — ABNORMAL HIGH (ref 70–99)
Hgb A1c MFr Bld: 6.6 %
Sodium: 140 meq/L (ref 135–145)
TSH: 1.378 microintl units/mL (ref 0.350–4.500)
Total Bilirubin: 0.5 mg/dL (ref 0.3–1.2)

## 2009-06-29 ENCOUNTER — Encounter: Payer: Self-pay | Admitting: Internal Medicine

## 2009-07-04 ENCOUNTER — Telehealth: Payer: Self-pay | Admitting: Internal Medicine

## 2009-07-06 ENCOUNTER — Ambulatory Visit (HOSPITAL_COMMUNITY): Admission: RE | Admit: 2009-07-06 | Discharge: 2009-07-06 | Payer: Self-pay | Admitting: Unknown Physician Specialty

## 2009-07-06 LAB — HM DIABETES EYE EXAM

## 2009-09-18 ENCOUNTER — Ambulatory Visit: Payer: Self-pay | Admitting: Internal Medicine

## 2009-09-18 LAB — CONVERTED CEMR LAB
AST: 18 units/L (ref 0–37)
Alkaline Phosphatase: 111 units/L (ref 39–117)
BUN: 19 mg/dL (ref 6–23)
Blood Glucose, Fingerstick: 91
CO2: 26 meq/L (ref 19–32)
Creatinine, Ser: 1.02 mg/dL (ref 0.40–1.20)
Glucose, Bld: 79 mg/dL (ref 70–99)
Hgb A1c MFr Bld: 6.4 %
Potassium: 4.3 meq/L (ref 3.5–5.3)
Total Bilirubin: 0.5 mg/dL (ref 0.3–1.2)
Total Protein: 7.7 g/dL (ref 6.0–8.3)

## 2009-11-07 ENCOUNTER — Telehealth: Payer: Self-pay | Admitting: Internal Medicine

## 2009-12-15 ENCOUNTER — Ambulatory Visit: Payer: Self-pay | Admitting: Internal Medicine

## 2009-12-15 LAB — CONVERTED CEMR LAB: Hgb A1c MFr Bld: 6.4 %

## 2009-12-19 LAB — CONVERTED CEMR LAB
ALT: 18 units/L (ref 0–35)
AST: 20 units/L (ref 0–37)
Albumin: 4.8 g/dL (ref 3.5–5.2)
Alkaline Phosphatase: 139 units/L — ABNORMAL HIGH (ref 39–117)
Basophils Absolute: 0 10*3/uL (ref 0.0–0.1)
Cholesterol: 150 mg/dL (ref 0–200)
Creatinine, Ser: 1.35 mg/dL — ABNORMAL HIGH (ref 0.40–1.20)
HCT: 40.6 % (ref 36.0–46.0)
HDL: 42 mg/dL (ref >39)
Lymphocytes Relative: 38 % (ref 12–46)
Lymphs Abs: 2.4 10*3/uL (ref 0.7–4.0)
MCV: 87.1 fL (ref >=78.0)
Monocytes Absolute: 0.7 10*3/uL (ref 0.1–1.0)
Monocytes Relative: 10 % (ref 3–12)
Neutro Abs: 2.9 10*3/uL (ref 1.7–7.7)
Platelets: 324 10*3/uL (ref 150–400)
RDW: 14.4 % (ref 11.5–15.5)
TSH: 2.495 microintl units/mL (ref 0.350–4.5)
Total Bilirubin: 0.5 mg/dL (ref 0.3–1.2)
Total CHOL/HDL Ratio: 3.6
Total Protein: 8 g/dL (ref 6.0–8.3)
Triglycerides: 111 mg/dL (ref <150)

## 2009-12-21 ENCOUNTER — Telehealth: Payer: Self-pay | Admitting: Internal Medicine

## 2009-12-25 ENCOUNTER — Ambulatory Visit: Payer: Self-pay | Admitting: Internal Medicine

## 2009-12-25 ENCOUNTER — Ambulatory Visit (HOSPITAL_COMMUNITY): Admission: RE | Admit: 2009-12-25 | Discharge: 2009-12-25 | Payer: Self-pay | Admitting: Internal Medicine

## 2009-12-25 LAB — CONVERTED CEMR LAB
BUN: 24 mg/dL — ABNORMAL HIGH (ref 6–23)
Blood Glucose, Fingerstick: 114
CO2: 25 meq/L (ref 19–32)
Calcium: 9.9 mg/dL (ref 8.4–10.5)
Creatinine, Ser: 1.06 mg/dL (ref 0.40–1.20)
Potassium: 4 meq/L (ref 3.5–5.3)
Sodium: 138 meq/L (ref 135–145)

## 2009-12-27 LAB — CONVERTED CEMR LAB
Bilirubin Urine: NEGATIVE
Casts: NONE SEEN /lpf
Crystals: NONE SEEN
Ketones, ur: NEGATIVE mg/dL
Protein, ur: NEGATIVE mg/dL
Specific Gravity, Urine: 1.022 (ref 1.005–1.0)
Urine Glucose: NEGATIVE mg/dL
Urobilinogen, UA: 0.2 (ref 0.0–1.0)

## 2010-01-08 ENCOUNTER — Telehealth: Payer: Self-pay | Admitting: Internal Medicine

## 2010-01-15 ENCOUNTER — Telehealth: Payer: Self-pay | Admitting: Internal Medicine

## 2010-01-19 ENCOUNTER — Telehealth: Payer: Self-pay | Admitting: Internal Medicine

## 2010-03-19 ENCOUNTER — Telehealth: Payer: Self-pay | Admitting: Internal Medicine

## 2010-03-27 ENCOUNTER — Telehealth: Payer: Self-pay | Admitting: Internal Medicine

## 2010-04-17 ENCOUNTER — Telehealth: Payer: Self-pay | Admitting: Internal Medicine

## 2010-05-07 ENCOUNTER — Ambulatory Visit: Payer: Self-pay | Admitting: Internal Medicine

## 2010-05-08 ENCOUNTER — Telehealth: Payer: Self-pay | Admitting: *Deleted

## 2010-05-09 LAB — CONVERTED CEMR LAB
ALT: 14 units/L (ref 0–35)
AST: 16 units/L (ref 0–37)
Alkaline Phosphatase: 98 units/L (ref 39–117)
Creatinine, Urine: 105.2 mg/dL
Hemoglobin, Urine: NEGATIVE
LDL Cholesterol: 74 mg/dL (ref 0–99)
Leukocytes, UA: NEGATIVE
Microalb, Ur: 0.5 mg/dL (ref 0.00–1.89)
Nitrite: NEGATIVE
Protein, ur: NEGATIVE mg/dL
Sed Rate: 20 mm/hr (ref 0–22)
Sodium: 138 meq/L (ref 135–145)
Total Bilirubin: 0.3 mg/dL (ref 0.3–1.2)
Total Protein: 7.3 g/dL (ref 6.0–8.3)
VLDL: 33 mg/dL (ref 0–40)
Vitamin B-12: 901 pg/mL (ref 211–911)

## 2010-05-23 ENCOUNTER — Telehealth: Payer: Self-pay | Admitting: *Deleted

## 2010-05-25 ENCOUNTER — Ambulatory Visit: Payer: Self-pay | Admitting: Internal Medicine

## 2010-05-25 ENCOUNTER — Ambulatory Visit (HOSPITAL_COMMUNITY): Admission: RE | Admit: 2010-05-25 | Discharge: 2010-05-25 | Payer: Self-pay | Admitting: Internal Medicine

## 2010-07-13 ENCOUNTER — Telehealth: Payer: Self-pay | Admitting: Internal Medicine

## 2010-07-16 ENCOUNTER — Telehealth: Payer: Self-pay | Admitting: *Deleted

## 2010-08-02 ENCOUNTER — Encounter: Payer: Self-pay | Admitting: Internal Medicine

## 2010-08-07 ENCOUNTER — Telehealth: Payer: Self-pay | Admitting: *Deleted

## 2010-08-10 ENCOUNTER — Telehealth: Payer: Self-pay | Admitting: Internal Medicine

## 2010-08-13 ENCOUNTER — Telehealth: Payer: Self-pay | Admitting: *Deleted

## 2010-08-20 ENCOUNTER — Encounter: Payer: Self-pay | Admitting: Internal Medicine

## 2010-08-20 ENCOUNTER — Ambulatory Visit: Payer: Self-pay | Admitting: Internal Medicine

## 2010-08-20 ENCOUNTER — Ambulatory Visit (HOSPITAL_COMMUNITY): Admission: RE | Admit: 2010-08-20 | Discharge: 2010-08-20 | Payer: Self-pay | Admitting: Internal Medicine

## 2010-08-20 LAB — CONVERTED CEMR LAB
Eosinophils Absolute: 0.2 10*3/uL (ref 0.0–0.7)
Eosinophils Relative: 4 % (ref 0–5)
HCT: 37 % (ref 36.0–46.0)
Hemoglobin: 12 g/dL (ref 12.0–15.0)
Lymphs Abs: 2.6 10*3/uL (ref 0.7–4.0)
MCV: 87.7 fL (ref >=78.0)
Monocytes Absolute: 0.5 10*3/uL (ref 0.1–1.0)
Platelets: 313 10*3/uL (ref 150–400)
WBC: 6.2 10*3/uL (ref 4.0–10.5)

## 2010-08-31 ENCOUNTER — Ambulatory Visit: Payer: Self-pay | Admitting: Internal Medicine

## 2010-08-31 LAB — CONVERTED CEMR LAB: Blood Glucose, Fingerstick: 125

## 2010-09-11 ENCOUNTER — Telehealth: Payer: Self-pay | Admitting: Internal Medicine

## 2010-09-13 ENCOUNTER — Telehealth: Payer: Self-pay | Admitting: Internal Medicine

## 2010-10-05 ENCOUNTER — Telehealth: Payer: Self-pay | Admitting: *Deleted

## 2010-10-31 ENCOUNTER — Ambulatory Visit: Payer: Self-pay | Admitting: Internal Medicine

## 2010-10-31 ENCOUNTER — Encounter: Payer: Self-pay | Admitting: Internal Medicine

## 2010-10-31 DIAGNOSIS — R011 Cardiac murmur, unspecified: Secondary | ICD-10-CM | POA: Insufficient documentation

## 2010-10-31 LAB — CONVERTED CEMR LAB: Blood Glucose, Fingerstick: 99

## 2010-11-01 ENCOUNTER — Ambulatory Visit (HOSPITAL_COMMUNITY): Admission: RE | Admit: 2010-11-01 | Payer: Self-pay | Source: Home / Self Care | Admitting: Internal Medicine

## 2010-11-01 LAB — CONVERTED CEMR LAB
AST: 24 units/L (ref 0–37)
Albumin: 4.5 g/dL (ref 3.5–5.2)
Alkaline Phosphatase: 133 units/L — ABNORMAL HIGH (ref 39–117)
HDL: 39 mg/dL — ABNORMAL LOW (ref >39)
LDL Cholesterol: 66 mg/dL (ref 0–99)
Potassium: 4 meq/L (ref 3.5–5.3)
Sodium: 140 meq/L (ref 135–145)
Total Bilirubin: 0.5 mg/dL (ref 0.3–1.2)
Total Protein: 7.5 g/dL (ref 6.0–8.3)
VLDL: 40 mg/dL (ref 0–40)

## 2010-11-07 ENCOUNTER — Encounter: Payer: Self-pay | Admitting: Internal Medicine

## 2010-11-07 ENCOUNTER — Ambulatory Visit (HOSPITAL_COMMUNITY)
Admission: RE | Admit: 2010-11-07 | Discharge: 2010-11-07 | Payer: Self-pay | Source: Home / Self Care | Attending: Internal Medicine | Admitting: Internal Medicine

## 2010-11-08 ENCOUNTER — Telehealth: Payer: Self-pay | Admitting: Internal Medicine

## 2010-11-28 ENCOUNTER — Ambulatory Visit: Admission: RE | Admit: 2010-11-28 | Discharge: 2010-11-28 | Payer: Self-pay | Source: Home / Self Care

## 2010-11-28 LAB — CONVERTED CEMR LAB: Hgb A1c MFr Bld: 6.7 %

## 2010-12-09 ENCOUNTER — Encounter: Payer: Self-pay | Admitting: Internal Medicine

## 2010-12-12 ENCOUNTER — Telehealth: Payer: Self-pay | Admitting: *Deleted

## 2010-12-18 NOTE — Assessment & Plan Note (Signed)
Summary: 2 wk f/u (madera)/cfb   Vital Signs:  Patient profile:   77 year old female Height:      66 inches Weight:      136.1 pounds BMI:     22.05 Temp:     98.4 degrees F oral Pulse rate:   95 / minute BP sitting:   126 / 66  (right arm)  Vitals Entered By: Filomena Jungling NT II (August 31, 2010 4:08 PM) CC: follow-upvisit Is Patient Diabetic? Yes Did you bring your meter with you today? Yes Pain Assessment Patient in pain? no      Nutritional Status BMI of 19 -24 = normal CBG Result 125  Have you ever been in a relationship where you felt threatened, hurt or afraid?No   Does patient need assistance? Functional Status Self care Ambulation Normal   Primary Care Provider:  Vassie Loll MD  CC:  follow-upvisit.  History of Present Illness: 77 yr old woman with pmhx as described below comes to the clinic for follow up. Patient reports that inhaler is helping with her symptoms of dyspnea. Has no complains. Would like to know more about Obstructive airway disease.   Preventive Screening-Counseling & Management  Alcohol-Tobacco     Smoking Status: never  Caffeine-Diet-Exercise     Does Patient Exercise: no     Type of exercise: ROM     Times/week: 1  Problems Prior to Update: 1)  Chronic Obstructive Pulmonary Disease, Mild  (ICD-496) 2)  Back Pain, Acute  (ICD-724.5) 3)  Renal Insufficiency, Acute  (ICD-585.9) 4)  Flank Pain, Left  (ICD-789.09) 5)  Cough  (ICD-786.2) 6)  Upper Respiratory Infection, Acute, With Bronchitis  (ICD-465.9) 7)  Diabetes Mellitus, Type II  (ICD-250.00) 8)  Hypertension  (ICD-401.9) 9)  Hyperlipidemia  (ICD-272.4) 10)  Migraine Headache  (ICD-346.90) 11)  Hypothyroidism  (ICD-244.9) 12)  Bone Tumor  (ICD-239.2) 13)  Pulmonary Nodule  (ICD-518.89) 14)  Anemia-nos  (ICD-285.9) 15)  Polymyalgia Rheumatica  (ICD-725) 16)  Gerd  (ICD-530.81)  Medications Prior to Update: 1)  Hydrochlorothiazide 25 Mg Tabs (Hydrochlorothiazide) ....  Take 1 Tablet By Mouth Once A Day 2)  Imipramine Hcl 50 Mg Tabs (Imipramine Hcl) .... Take 1 Tablet By Mouth Two Times A Day 3)  Synthroid 25 Mcg Tabs (Levothyroxine Sodium) .... Take 1 Tablet By Mouth Once Daily 4)  Glimepiride 1 Mg Tabs (Glimepiride) .... Take 1 Tablet By Mouth Once Daily 5)  Diovan 160 Mg Tabs (Valsartan) .... Take 1 Tablet By Mouth Once A Day 6)  Onetouch Ultrasoft Lancets   Misc (Lancets) .... Use As Directed To Check Your Sugars 7)  Onetouch Ultra Test   Strp (Glucose Blood) .... Use As Directed To Check Your Sugars 8)  Tracer Ii 3 Volt Battery   Misc (Blood Glucose Monitoring Suppl) .... Needs 3 Volt Lithium Battery For One Touch Ultra Meter To Test Her Blood Glucose- Cpt Code A4254 9)  Oscal 500/200 D-3 500-200 Mg-Unit  Tabs (Calcium-Vitamin D) .... Take 1 Tablet By Mouth Three Times A Day 10)  Lipitor 40 Mg Tabs (Atorvastatin Calcium) .... Take 1 Tab By Mouth At Bedtime 11)  Nexium 40 Mg Cpdr (Esomeprazole Magnesium) .... Take 1 Tablet By Mouth Once A Day 12)  Cyclobenzaprine Hcl 5 Mg Tabs (Cyclobenzaprine Hcl) .... Take 1 Tablet By Mouth Once A Day At Night. You May Take 1 Pill During Day Time If in Acute Pain. 13)  Proventil Hfa 108 (90 Base) Mcg/act Aers (Albuterol Sulfate) .Marland KitchenMarland KitchenMarland Kitchen  2 Puffs Every 4-6 Hours As Needed For Shortness of Breath  Current Medications (verified): 1)  Hydrochlorothiazide 25 Mg Tabs (Hydrochlorothiazide) .... Take 1 Tablet By Mouth Once A Day 2)  Imipramine Hcl 50 Mg Tabs (Imipramine Hcl) .... Take 1 Tablet By Mouth Two Times A Day 3)  Synthroid 25 Mcg Tabs (Levothyroxine Sodium) .... Take 1 Tablet By Mouth Once Daily 4)  Glimepiride 1 Mg Tabs (Glimepiride) .... Take 1 Tablet By Mouth Once Daily 5)  Diovan 160 Mg Tabs (Valsartan) .... Take 1 Tablet By Mouth Once A Day 6)  Onetouch Ultrasoft Lancets   Misc (Lancets) .... Use As Directed To Check Your Sugars 7)  Onetouch Ultra Test   Strp (Glucose Blood) .... Use As Directed To Check Your Sugars 8)   Tracer Ii 3 Volt Battery   Misc (Blood Glucose Monitoring Suppl) .... Needs 3 Volt Lithium Battery For One Touch Ultra Meter To Test Her Blood Glucose- Cpt Code A4254 9)  Oscal 500/200 D-3 500-200 Mg-Unit  Tabs (Calcium-Vitamin D) .... Take 1 Tablet By Mouth Three Times A Day 10)  Lipitor 40 Mg Tabs (Atorvastatin Calcium) .... Take 1 Tab By Mouth At Bedtime 11)  Nexium 40 Mg Cpdr (Esomeprazole Magnesium) .... Take 1 Tablet By Mouth Once A Day 12)  Cyclobenzaprine Hcl 5 Mg Tabs (Cyclobenzaprine Hcl) .... Take 1 Tablet By Mouth Once A Day At Night. You May Take 1 Pill During Day Time If in Acute Pain. 13)  Proventil Hfa 108 (90 Base) Mcg/act Aers (Albuterol Sulfate) .... 2 Puffs Every 4-6 Hours As Needed For Shortness of Breath  Allergies: 1)  ! Asa 2)  ! Codeine  Past History:  Past Medical History: Last updated: 04/29/2008 Diabetes mellitus, type II GERD Hypertension Hypothyroidism Anemia-NOS Hyperlipidemia Temporal arteritis-suspected /Polymyalgia rheumatica Rotator cuff syndrome Sinus arrythmia Tinnitus Migraine headaches H. pylori infection Myoclonus, hx of Postmenopausal state Paresthesia, R 2-4th finger tingling Pulmonary scarring and nodules, likely postinflammatory Right clavicular mass, w/up in process 6/09  Past Surgical History: Last updated: 09/04/2006 Hysterectomy - TAH, 1976  Family History: Last updated: 04-03-09 Mother died of colon cancer at age 52. 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 Mother died of colon cancer at age 75. F passed at 13 from emphysema 4 half sisters, 1 with AD, 1 passed with AD, 2 unknown 5 children, 1 son passed from emphysema and PAH; 1 daughter s/p lung transplant for [facility] (died Mar 18, 2009), other daughter and 2 sons are healthy  Social History: Reviewed history from 10/17/2008 and no changes required. 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    Physical Exam  General:  NAD Lungs:  normal respiratory effort and normal breath sounds.   Heart:  normal  rate and regular rhythm, SEM II/VI at RSB.   Abdomen:  soft, non-tender, and normal bowel sounds.   Extremities:  no edema Neurologic:  alert & oriented X3.     Impression & Recommendations:  Problem # 1:  CHRONIC OBSTRUCTIVE PULMONARY DISEASE, MILD (ICD-496) Responding well to inhaler. Patient was educated on mild obstructive airway disease. Printed and gave patient more information at the end of the visit to further supplement knowledge of disease.   Her updated medication list for this problem includes:    Proventil Hfa 108 (90 Base) Mcg/act Aers (Albuterol sulfate) .Marland Kitchen... 2 puffs every 4-6 hours as needed for shortness of breath  Problem # 2:  HYPERTENSION (ICD-401.9) At goal. Continue current regimen.  Her updated medication list for this problem includes:    Hydrochlorothiazide 25 Mg Tabs (Hydrochlorothiazide) .Marland Kitchen... Take 1 tablet by mouth once a day    Diovan 160 Mg Tabs (Valsartan) .Marland Kitchen... Take 1 tablet by mouth once a day  BP today: 126/66 Prior BP: 157/73 (08/20/2010)  Labs Reviewed: K+: 4.4 (05/07/2010) Creat: : 1.16 (05/07/2010)   Chol: 150  (05/07/2010)   HDL: 43 (05/07/2010)   LDL: 74 (05/07/2010)   TG: 165 (05/07/2010)  Problem # 3:  BACK PAIN, ACUTE (ICD-724.5) Improved.   Her updated medication list for this problem includes:    Cyclobenzaprine Hcl 5 Mg Tabs (Cyclobenzaprine hcl) .Marland Kitchen... Take 1 tablet by mouth once a day at night. you may take 1 pill during day time if in acute pain.  Complete Medication List: 1)  Hydrochlorothiazide 25 Mg Tabs (Hydrochlorothiazide) .... Take 1 tablet by mouth once a day 2)  Imipramine Hcl 50 Mg Tabs (Imipramine hcl) .... Take 1 tablet by mouth two times a day 3)  Synthroid 25 Mcg Tabs (Levothyroxine sodium) .... Take 1 tablet by mouth once daily 4)  Glimepiride 1 Mg Tabs (Glimepiride) .... Take 1 tablet by mouth once daily 5)  Diovan 160 Mg Tabs (Valsartan) .... Take 1 tablet by mouth once a day 6)  Onetouch Ultrasoft Lancets Misc (Lancets) .... Use as directed to check your sugars 7)  Onetouch Ultra Test Strp (Glucose blood) .... Use as directed to check your sugars 8)  Tracer Ii 3 Volt Battery Misc (Blood glucose monitoring suppl) .... Needs 3 volt lithium battery for one touch ultra meter to test her blood glucose- cpt code a4254 9)  Oscal 500/200 D-3 500-200 Mg-unit Tabs (Calcium-vitamin d) .... Take 1 tablet by mouth three times a day 10)  Lipitor 40 Mg Tabs (Atorvastatin calcium) .... Take 1 tab by mouth at bedtime 11)  Nexium 40 Mg Cpdr (Esomeprazole magnesium) .... Take 1 tablet by mouth once a day 12)  Cyclobenzaprine Hcl 5 Mg Tabs (Cyclobenzaprine hcl) .... Take 1 tablet by mouth once a day at night. you may take 1 pill during day time if in acute pain. 13)  Proventil Hfa 108 (90 Base) Mcg/act Aers (Albuterol sulfate) .... 2 puffs every 4-6 hours as needed for shortness of breath  Patient Instructions: 1)  Please follow-up scheduled appointment in December with PCP. 2)  Take all medications as directed.   Orders Added: 1)  Est. Patient Level III [29562]

## 2010-12-18 NOTE — Progress Notes (Signed)
Summary: back pain/ hla  Phone Note Call from Patient   Complaint: Chest Pain Summary of Call: pt calls to c/o back pain, was seen 6/20 for this problem and states that dr Gwenlyn Perking told her if it was not better to call and he would order a scan or xray, she states that it is still very painful and she desires to be seen this week for re-eval. medicine helped some, activity aggravates it. appt given for fri Initial call taken by: Marin Roberts RN,  May 23, 2010 4:36 PM  Follow-up for Phone Call        Agree with plan. Follow-up by: Margarito Liner MD,  May 23, 2010 4:40 PM

## 2010-12-18 NOTE — Progress Notes (Signed)
Summary: med refill/gp  Phone Note Refill Request Message from:  Fax from Pharmacy on Mar 19, 2010 9:03 AM  Refills Requested: Medication #1:  IMIPRAMINE HCL 50 MG TABS Take 1 tablet by mouth two times a day   Last Refilled: 02/10/2010  Method Requested: Electronic Initial call taken by: Chinita Pester RN,  Mar 19, 2010 9:04 AM  Follow-up for Phone Call        Refill approved-nurse to complete    Prescriptions: IMIPRAMINE HCL 50 MG TABS (IMIPRAMINE HCL) Take 1 tablet by mouth two times a day  #60 Tablet x 5   Entered and Authorized by:   Vassie Loll MD   Signed by:   Vassie Loll MD on 03/19/2010   Method used:   Electronically to        [facility]  [address] # [id number]* (retail)       [address]       [place], [place]  [address]       Ph: [phone] or [phone]       Fax: [phone]   RxID:   [id number]

## 2010-12-18 NOTE — Progress Notes (Signed)
Summary: phone call/ hla  Phone Note Call from Patient   Summary of Call: pt calls and leaves message she has questions, i have called her twice and left message to call back, leave time that would be good to call and speak w/ her Initial call taken by: Marin Roberts RN,  August 07, 2010 3:59 PM

## 2010-12-18 NOTE — Progress Notes (Signed)
Summary: med refill/gp  Phone Note Refill Request Message from:  Fax from Pharmacy on September 13, 2010 10:13 AM  Refills Requested: Medication #1:  IMIPRAMINE HCL 50 MG TABS Take 1 tablet by mouth two times a day   Last Refilled: 08/05/2010  Medication #2:  LIPITOR 40 MG TABS Take 1 tab by mouth at bedtime   Last Refilled: 08/05/2010 Last appt. 08/31/10.   Method Requested: Electronic Initial call taken by: Chinita Pester RN,  September 13, 2010 10:14 AM  Follow-up for Phone Call        Refill approved-nurse to complete    Prescriptions: LIPITOR 40 MG TABS (ATORVASTATIN CALCIUM) Take 1 tab by mouth at bedtime  #30 Tablet x 5   Entered and Authorized by:   Vassie Loll MD   Signed by:   Vassie Loll MD on 09/13/2010   Method used:   Electronically to        [facility]  [address] [id number]* (retail)       [address]       [place], [place]  [address]       Ph: [phone] or [phone]       Fax: [phone]   RxID:   [id number] IMIPRAMINE HCL 50 MG TABS (IMIPRAMINE HCL) Take 1 tablet by mouth two times a day  #60 Tablet x 5   Entered and Authorized by:   Vassie Loll MD   Signed by:   Vassie Loll MD on 09/13/2010   Method used:   Electronically to        [facility]  [address] [id number]* (retail)       [address]       [place], [place]  [address]       Ph: [phone] or [phone]       Fax: [phone]   RxID:   [id number]

## 2010-12-18 NOTE — Progress Notes (Signed)
Summary: phone/gg  Phone Note Call from Patient   Caller: Patient Summary of Call: Pt called today and states she is not better after taking the antibiotic you gave her on  1/28.  Still coughing, productive and she doesn't feel good.   No other c/o, she is eating and drinking. Can she have something to stop her from coughing. Pt # [id number] Initial call taken by: Merrie Roof RN,  December 21, 2009 10:12 AM  Follow-up for Phone Call        Patient needs to start using mucinex 600mg  by mouth two times a day. If symptoms perist after 2 weeks of been taking medications, advised patient to call us in order to reevaluate her.     Appended Document: phone/gg Pt informed

## 2010-12-18 NOTE — Progress Notes (Signed)
Summary: med refill/gp  Phone Note Refill Request Message from:  Patient on January 08, 2010 11:25 AM  Refills Requested: Medication #1:  Sandria Senter ER 8-10 MG/5ML LQCR Take 5 mL by mouth twice a day as needed for cough.. Pt. states she still has a cough but not as bad; white sometimes yellow phlegm.  Request another refill.   Method Requested: Telephone to Pharmacy Initial call taken by: Chinita Pester RN,  January 08, 2010 11:25 AM  Follow-up for Phone Call        Refill approved-nurse to complete    Prescriptions: TUSSIONEX PENNKINETIC ER 8-10 MG/5ML LQCR (CHLORPHENIRAMINE-HYDROCODONE) Take 5 mL by mouth twice a day as needed for cough.  #70 mL x 0   Entered and Authorized by:   Vassie Loll MD   Signed by:   Vassie Loll MD on 01/08/2010   Method used:   Telephoned to ...       [facility]  [address] [id number]* (retail)       [address]       [place], [place]  [address]       Ph: [phone] or [phone]       Fax: [phone]   RxID:   [id number]   Appended Document: med refill/gp Above rx called to [facility] pharmacy.

## 2010-12-18 NOTE — Assessment & Plan Note (Signed)
Summary: EST-CK/FU/MEDS/CFB   Vital Signs:  Patient profile:   77 year old female Height:      66 inches Weight:      129.1 pounds BMI:     20.91 Temp:     97.5 degrees F oral Pulse rate:   91 / minute BP sitting:   135 / 71  (right arm)  Vitals Entered By: Filomena Jungling NT II (May 07, 2010 3:09 PM) CC: FOLLOW-UP VISIT/ BACK PAIN STARTED ON SATURDAY Is Patient Diabetic? Yes Did you bring your meter with you today? No Pain Assessment Patient in pain? yes     Location: back Intensity: 10 Type: aching Onset of pain  started on saturday Nutritional Status BMI of 19 -24 = normal  Have you ever been in a relationship where you felt threatened, hurt or afraid?No   Does patient need assistance? Functional Status Self care Ambulation Normal   Primary Care Provider:  Vassie Loll MD  CC:  FOLLOW-UP VISIT/ BACK PAIN STARTED ON SATURDAY.  History of Present Illness: 77 y/o female with pmh as described on the EMR. Who comes to the clinic for followup of her chronic conditions, to get refill on her meds and to discussed about low back pain that started 3 days prior to this visit (on Saturday). Patient denies fever, chills, dysuria, trauma, weight loss, LE weakness, numbness sensation, urine or fecal incontinence.  Patient is compliant with her medications and reports to follow a low sodium diet as well.  Patient is also reporting that her cough still going on, the cough is dry (even sometimes have some white sputum production), no hemoptysis, and is associated with tickles sensation, post nasal discharge  and dryness on her throat.  Patient has used mucinex and toussinex w/o help.  Patient is concern of temporal arteritis due to mild pain on lateral aspect of her head (on/off) and the fact that she has had elevated ESR in the past.     Preventive Screening-Counseling & Management  Alcohol-Tobacco     Smoking Status: never  Caffeine-Diet-Exercise     Does Patient Exercise:  no     Type of exercise: ROM     Times/week: 1  Problems Prior to Update: 1)  Back Pain, Acute  (ICD-724.5) 2)  Renal Insufficiency, Acute  (ICD-585.9) 3)  Flank Pain, Left  (ICD-789.09) 4)  Cough  (ICD-786.2) 5)  Upper Respiratory Infection, Acute, With Bronchitis  (ICD-465.9) 6)  Diabetes Mellitus, Type II  (ICD-250.00) 7)  Hypertension  (ICD-401.9) 8)  Hyperlipidemia  (ICD-272.4) 9)  Migraine Headache  (ICD-346.90) 10)  Hypothyroidism  (ICD-244.9) 11)  Bone Tumor  (ICD-239.2) 12)  Pulmonary Nodule  (ICD-518.89) 13)  Anemia-nos  (ICD-285.9) 14)  Polymyalgia Rheumatica  (ICD-725) 15)  Gerd  (ICD-530.81)  Medications Prior to Update: 1)  Hydrochlorothiazide 25 Mg Tabs (Hydrochlorothiazide) .... Take 1 Tablet By Mouth Once A Day 2)  Imipramine Hcl 50 Mg Tabs (Imipramine Hcl) .... Take 1 Tablet By Mouth Two Times A Day 3)  Synthroid 25 Mcg Tabs (Levothyroxine Sodium) .... Take 1 Tablet By Mouth Once Daily 4)  Glimepiride 1 Mg Tabs (Glimepiride) .... Take 1 Tablet By Mouth Once Daily 5)  Diovan 160 Mg Tabs (Valsartan) .... Take 1 Tablet By Mouth Once A Day 6)  Onetouch Ultrasoft Lancets   Misc (Lancets) .... Use As Directed To Check Your Sugars 7)  Onetouch Ultra Test   Strp (Glucose Blood) .... Use As Directed To Check Your Sugars 8)  Tracer Ii  3 Volt Battery   Misc (Blood Glucose Monitoring Suppl) .... Needs 3 Volt Lithium Battery For One Touch Ultra Meter To Test Her Blood Glucose- Cpt Code A4254 9)  Oscal 500/200 D-3 500-200 Mg-Unit  Tabs (Calcium-Vitamin D) .... Take 1 Tablet By Mouth Three Times A Day 10)  Lipitor 40 Mg Tabs (Atorvastatin Calcium) .... Take 1 Tab By Mouth At Bedtime 11)  Nexium 40 Mg Cpdr (Esomeprazole Magnesium) .... Take 1 Tablet By Mouth Once A Day 12)  Tussionex Pennkinetic Er 8-10 Mg/1ml Lqcr (Chlorpheniramine-Hydrocodone) .... Take 5 Ml By Mouth Twice A Day As Needed For Cough.  Current Medications (verified): 1)  Hydrochlorothiazide 25 Mg Tabs  (Hydrochlorothiazide) .... Take 1 Tablet By Mouth Once A Day 2)  Imipramine Hcl 50 Mg Tabs (Imipramine Hcl) .... Take 1 Tablet By Mouth Two Times A Day 3)  Synthroid 25 Mcg Tabs (Levothyroxine Sodium) .... Take 1 Tablet By Mouth Once Daily 4)  Glimepiride 1 Mg Tabs (Glimepiride) .... Take 1 Tablet By Mouth Once Daily 5)  Diovan 160 Mg Tabs (Valsartan) .... Take 1 Tablet By Mouth Once A Day 6)  Onetouch Ultrasoft Lancets   Misc (Lancets) .... Use As Directed To Check Your Sugars 7)  Onetouch Ultra Test   Strp (Glucose Blood) .... Use As Directed To Check Your Sugars 8)  Tracer Ii 3 Volt Battery   Misc (Blood Glucose Monitoring Suppl) .... Needs 3 Volt Lithium Battery For One Touch Ultra Meter To Test Her Blood Glucose- Cpt Code A4254 9)  Oscal 500/200 D-3 500-200 Mg-Unit  Tabs (Calcium-Vitamin D) .... Take 1 Tablet By Mouth Three Times A Day 10)  Lipitor 40 Mg Tabs (Atorvastatin Calcium) .... Take 1 Tab By Mouth At Bedtime 11)  Nexium 40 Mg Cpdr (Esomeprazole Magnesium) .... Take 1 Tablet By Mouth Once A Day  Allergies (verified): 1)  ! Asa 2)  ! Codeine  Past History:  Past Medical History: Last updated: 04/29/2008 Diabetes mellitus, type II GERD Hypertension Hypothyroidism Anemia-NOS Hyperlipidemia Temporal arteritis-suspected /Polymyalgia rheumatica Rotator cuff syndrome Sinus arrythmia Tinnitus Migraine headaches H. pylori infection Myoclonus, hx of Postmenopausal state Paresthesia, R 2-4th finger tingling Pulmonary scarring and nodules, likely postinflammatory Right clavicular mass, w/up in process 6/09  Past Surgical History: Last updated: 09/04/2006 Hysterectomy - TAH, 1976  Family History: Last updated: Mar 20, 2009 Mother died of colon cancer at age 78. 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  Physical Exam  General:  alert, well-developed, and well-hydrated.   Eyes:  vision grossly intact.  EOMI, PERRLA and no nystagmus present. Nose:  no active discharges and just mild paranasal tenderness on palpation. Mouth:  mild to moderate throat erythema, no exudate or other signs of active infection. Lungs:  normal respiratory effort, normal breath sounds, no crackles, and no wheezes.   Heart:  normal rate and regular rhythm, SEM II/VI at RSB.   Abdomen:  +BS's, softs, NT and ND. Msk:  normal  ROM, no joint tenderness, no joint swelling, and no crepitation.  Patient with lumba area tenderness on palpation, neg SLR test and no radiculopathy described. Muscles in that area felt tense on exam. Extremities:  no edema, no cyanosis, no clubbing. Good pulses. Neurologic:  alert & oriented X3, cranial nerves II-XII intact, strength normal in all extremities, and gait normal.    Diabetes Management Exam:    Foot Exam (with socks and/or shoes not present):       Sensory-Pinprick/Light touch:          Left medial foot (L-4): normal          Left dorsal foot (L-5): normal          Left lateral foot (S-1): normal          Right medial foot (L-4): diminished          Right dorsal foot (L-5): normal          Right lateral foot (S-1): normal       Sensory-Monofilament:          Left foot: normal          Right foot: normal       Inspection:          Left foot: normal          Right foot: normal       Nails:          Left foot: normal          Right foot: normal   Impression & Recommendations:  Problem # 1:  BACK PAIN, ACUTE  (ICD-724.5) Patient reprots the pain started after doing some light house duties, which in her case with mild deconditioning could be just secondary to muscle starin. Other possibilities includes UTI and disc herniation. Patient PE is pretty benign and her UA and urine cx are neg. Will treat conservatively with flexeril and diclofenac; will follow her symptoms and if they failed to improved will go ahead and image her. Will also check a Vit D level.  Her updated medication list for this problem includes:    Diclofenac Sodium 75 Mg Tbec (Diclofenac sodium) .Marland Kitchen... Take 1 tablet by mouth two times a day    Flexeril 5 Mg Tabs (Cyclobenzaprine hcl) .Marland Kitchen... Take 1 tab by mouth at bedtime  Orders: T-Culture, Urine ([id number]) T-Vitamin D (25-Hydroxy) [phone]) T-Urinalysis ([id number])  Problem # 2:  COUGH (ICD-786.2) chronic dry cough, with hx of PND (especially at night) and some inttermitent hoarseness; also with negative recent neg CXR and no other associated symptoms of infection. Most likely 2/2 PND and irritation of vocal cords. Will start treatment with loratadine daily and if symptoms continue and failed to improved will use atrovent inhaler. Will continue treating her GERD and will provide instructions again for life style modification changes.  Problem # 3:  DIABETES MELLITUS, TYPE II (ICD-250.00) CBG's in her meter demonstrated excellent controlled with highest level in the 160-170 range. A1C was 6.3; will continue current regimen, patient was congratulated for such an excellent compliant with her meds and was encourage to continue doing such a good job.  Her updated medication list for this problem includes:    Glimepiride 1 Mg Tabs (Glimepiride) .Marland Kitchen... Take 1 tablet by mouth once daily    Diovan 160 Mg Tabs (Valsartan) .Marland Kitchen... Take 1 tablet by mouth once a day  Orders: T-Hgb A1C (in-house) ([id number]) T-Urine Microalbumin w/creat. ratio [phone])  Problem # 4:  HYPERLIPIDEMIA  (ICD-272.4) LDL  74, essentially at goal. Will advise her to follow a low fat diet and to continue taking her lipitor 40mg  daily. LFT's WNL.  Her updated medication list for this problem includes:    Lipitor 40 Mg Tabs (Atorvastatin calcium) .Marland Kitchen... Take 1 tab by mouth at bedtime  Orders: T-Lipid Profile ([id number])  Problem # 5:  HYPERTENSION (ICD-401.9) At goal. Renal function and electrolytes WNl. Patient advise to follow a low sodium diet and to continue taking her meds as directed. No medications changes are needed.  Her updated medication list for this problem includes:    Hydrochlorothiazide 25 Mg Tabs (Hydrochlorothiazide) .Marland Kitchen... Take 1 tablet by mouth once a day    Diovan 160 Mg Tabs (Valsartan) .Marland Kitchen... Take 1 tablet by mouth once a day  Problem # 6:  HYPOTHYROIDISM (ICD-244.9) TSH WNL. Will continue synthroid 25 mcg daily. No symptoms of hypothyroidism reported.  Her updated medication list for this problem includes:    Synthroid 25 Mcg Tabs (Levothyroxine sodium) .Marland Kitchen... Take 1 tablet by mouth once daily  Orders: T-TSH ([id number])  Problem # 7:  GERD (ICD-530.81) Will continue using nexium 40mg  daily; patient denies reflux complaints. Will refresh lifestyle modification instructions.  Her updated medication list for this problem includes:    Nexium 40 Mg Cpdr (Esomeprazole magnesium) .Marland Kitchen... Take 1 tablet by mouth once a day  Problem # 8:  MIGRAINE HEADACHE (ICD-346.90) ESR 22; no worsening of her symptoms. She was having on/off lateral forehead HA and was concern for temporal arteritis. With her hx of migraines and the fact that she was not having pain in the temporal area and her ESR was normal, I doubt that she has TA. Will continue treatment for her migraines as directed and will follow symptoms.  Orders: T-Sed Rate (Automated) [id number])  Her updated medication list for this problem includes:    Diclofenac Sodium 75 Mg Tbec (Diclofenac sodium) .Marland Kitchen... Take 1 tablet by  mouth two times a day  Complete Medication List: 1)  Hydrochlorothiazide 25 Mg Tabs (Hydrochlorothiazide) .... Take 1 tablet by mouth once a day 2)  Imipramine Hcl 50 Mg Tabs (Imipramine hcl) .... Take 1 tablet by mouth two times a day 3)  Synthroid 25 Mcg Tabs (Levothyroxine sodium) .... Take 1 tablet by mouth once daily 4)  Glimepiride 1 Mg Tabs (Glimepiride) .... Take 1 tablet by mouth once daily 5)  Diovan 160 Mg Tabs (Valsartan) .... Take 1 tablet by mouth once a day 6)  Onetouch Ultrasoft Lancets Misc (Lancets) .... Use as directed to check your sugars 7)  Onetouch Ultra Test Strp (Glucose blood) .... Use as directed to check your sugars 8)  Tracer Ii 3 Volt Battery Misc (Blood glucose monitoring suppl) .... Needs 3 volt lithium battery for one touch ultra meter to test her blood glucose- cpt code a4254 9)  Oscal 500/200 D-3 500-200 Mg-unit Tabs (Calcium-vitamin d) .... Take 1 tablet by mouth three times a day 10)  Lipitor 40 Mg Tabs (Atorvastatin calcium) .... Take 1 tab by mouth at bedtime 11)  Nexium 40 Mg Cpdr (Esomeprazole magnesium) .... Take 1 tablet by mouth once a day 12)  Hydromet 5-1.5 Mg/49ml Syrp (Hydrocodone-homatropine) .... Take 5ml every 8 hours as needed for cough. 13)  Diclofenac Sodium 75 Mg Tbec (Diclofenac sodium) .... Take 1 tablet by mouth two times a day 14)  Flexeril 5 Mg Tabs (Cyclobenzaprine hcl) .... Take 1 tab by mouth at bedtime 15)  Loratadine 10 Mg Tabs (Loratadine) .... Take 1 tablet  by mouth once a day  Other Orders: T-Comprehensive Metabolic Panel [id number]) T-Vitamin B12 ([id number])  Patient Instructions: 1)  Take your medications as prescribed. 2)  Followup in 3 months; sooner if labs abnormalities found during test today and changes or medications adjustment needed. 3)  Follow a low sodium and low fat diet. 4)  Most patients (90%) with low back pain will improve with time (2-6 weeks). Keep active but avoid activities that are painful. Apply  moist heat and/or ice to lower back several times a day. 5)  Avoid foods high in acid (tomatoes, citrus juices, spicy foods). Avoid eating within two hours of lying down or before exercising. Do not over eat; try smaller more frequent meals. Elevate head of bed twelve inches when sleeping. 6)  You will be called with any abnormalities in the tests scheduled or performed today.  If you don't hear from [place] within a week from when the test was performed, you can assume that your test was normal. Prescriptions: LIPITOR 40 MG TABS (ATORVASTATIN CALCIUM) Take 1 tab by mouth at bedtime  #30 Tablet x 3   Entered and Authorized by:   Vassie Loll MD   Signed by:   Vassie Loll MD on 05/07/2010   Method used:   Electronically to        [id number] (retail)       [address]       [place], [place]  [address]       Ph: [phone] or [phone]       Fax: [phone]   RxID:   [id number] OSCAL 500/200 D-3 500-200 MG-UNIT  TABS (CALCIUM-VITAMIN D) Take 1 tablet by mouth three times a day  #90 x 6   Entered and Authorized by:   Vassie Loll MD   Signed by:   Vassie Loll MD on 05/07/2010   Method used:   Electronically to        [id number]* (retail)       [address]       [place], [place]  [address]       Ph: [phone] or [phone]       Fax: [phone]   RxID:   [id number] HYDROCHLOROTHIAZIDE 25 MG TABS (HYDROCHLOROTHIAZIDE) Take 1 tablet by mouth once a day  #30 x 5   Entered and Authorized by:   Vassie Loll MD   Signed by:   Vassie Loll MD on 05/07/2010   Method used:   Electronically to        [id number]* (retail)       [address]       [place], [place]  [address]       Ph: [phone] or [phone]       Fax: [phone]   RxID:   [id number] LORATADINE 10 MG TABS (LORATADINE) Take 1 tablet by mouth once a day  #31 x 3   Entered and  Authorized by:   Vassie Loll MD   Signed by:   Vassie Loll MD on 05/07/2010   Method used:   Print then Give to Patient   RxID:   [id number] FLEXERIL 5 MG TABS (CYCLOBENZAPRINE HCL) Take 1 tab by mouth at bedtime  #10 x 0   Entered and Authorized by:   Mikle Bosworth  Gwenlyn Perking MD   Signed by:   Vassie Loll MD on 05/07/2010   Method used:   Print then Give to Patient   RxID:   [id number] DICLOFENAC SODIUM 75 MG TBEC (DICLOFENAC SODIUM) Take 1 tablet by mouth two times a day  #20 x 0   Entered and Authorized by:   Vassie Loll MD   Signed by:   Vassie Loll MD on 05/07/2010   Method used:   Print then Give to Patient   RxID:   [id number] HYDROMET 5-1.5 MG/5ML SYRP (HYDROCODONE-HOMATROPINE) Take 5ml every 8 hours as needed for cough.  #155ml x 0   Entered and Authorized by:   Vassie Loll MD   Signed by:   Vassie Loll MD on 05/07/2010   Method used:   Print then Give to Patient   RxID:   [id number]   Process Orders Check Orders Results:     [facility]: Check successful Tests Sent for requisitioning (May 09, 2010 7:52 AM):     05/07/2010: [facility] -- T-Culture, Urine [[id number]] (signed)     05/07/2010: [facility] -- T-Urine Microalbumin w/creat. ratio [[id number]] (signed)     05/07/2010: [facility] -- T-Lipid Profile [phone] (signed)     05/07/2010: [facility] -- T-Comprehensive Metabolic Panel [[id number]] (signed)     05/07/2010: [facility] -- T-TSH [phone] (signed)     05/07/2010: [facility] -- T-Vitamin D (25-Hydroxy) [id number] (signed)     05/07/2010: [facility] -- T-Vitamin B12 [phone] (signed)     05/07/2010: [facility] -- T-Urinalysis [[id number]] (signed)     05/07/2010: [facility] -- T-Sed Rate (Automated) [[id number]] (signed)    Prevention &  Chronic Care Immunizations   Influenza vaccine: Fluvax MCR  (09/18/2009)   Influenza vaccine deferral: Deferred  (05/07/2010)   Influenza vaccine due: 07/19/2010    Tetanus booster: 09/18/2009: Tdap   Td booster deferral: Deferred  (05/07/2010)   Tetanus booster due: 09/19/2019    Pneumococcal vaccine: Not documented   Pneumococcal vaccine deferral: Deferred  (05/07/2010)    H. zoster vaccine: Not documented   H. zoster vaccine deferral: Deferred  (05/07/2010)  Colorectal Screening   Hemoccult: Not documented   Hemoccult action/deferral: Deferred  (05/16/2009)   Hemoccult due: 05/16/2010    Colonoscopy: Location:  [facility].    (09/28/2008)   Colonoscopy action/deferral: Deferred  (05/16/2009)   Colonoscopy due: 09/28/2018  Other Screening   Pap smear: Not documented   Pap smear action/deferral: Not indicated S/P hysterectomy  (05/16/2009)    Mammogram: ASSESSMENT: Negative - BI-RADS 1^MM DIGITAL SCREENING  (07/06/2009)    DXA bone density scan: Not documented   DXA bone density action/deferral: Ordered  (09/18/2009)   Smoking status: never  (05/07/2010)  Diabetes Mellitus   HgbA1C: 6.3  (05/07/2010)   HgbA1C action/deferral: Ordered  (05/07/2010)   Hemoglobin A1C due: 08/16/2009    Eye exam: No diabetic retinopathy.  OU   (07/06/2009)   Eye exam due: 07/2010    Foot exam: yes  (05/07/2010)   Foot exam action/deferral: Do today   High risk foot: No  (03/25/2007)   Foot care education: Done  (03/16/2009)   Foot exam due: 05/16/2010    Urine microalbumin/creatinine ratio: 5.1  (03/16/2009)   Urine microalbumin action/deferral: Ordered   Urine microalbumin/cr due: 05/16/2010    Diabetes flowsheet reviewed?: Yes   Progress toward A1C goal: At goal  Lipids   Total Cholesterol: 150  (  12/15/2009)   Lipid panel action/deferral: Lipid Panel ordered   LDL: 86  (12/15/2009)   LDL Direct: Not documented   HDL: 42  (12/15/2009)   Triglycerides: 111   (12/15/2009)    SGOT (AST): 20  (12/15/2009)   BMP action: Ordered   SGPT (ALT): 18  (12/15/2009) CMP ordered    Alkaline phosphatase: 139  (12/15/2009)   Total bilirubin: 0.5  (12/15/2009)    Lipid flowsheet reviewed?: Yes   Progress toward LDL goal: At goal  Hypertension   Last Blood Pressure: 135 / 71  (05/07/2010)   Serum creatinine: 1.06  (12/25/2009)   BMP action: Ordered   Serum potassium 4.0  (12/25/2009) CMP ordered     Hypertension flowsheet reviewed?: Yes   Progress toward BP goal: At goal  Self-Management Support :   Personal Goals (by the next clinic visit) :     Personal A1C goal: 6  (09/18/2009)     Personal blood pressure goal: 130/80  (09/18/2009)     Personal LDL goal: 70  (09/18/2009)    Patient will work on the following items until the next clinic visit to reach self-care goals:     Medications and monitoring: take my medicines every day, check my blood sugar, bring all of my medications to every visit, examine my feet every day  (05/07/2010)     Eating: drink diet soda or water instead of juice or soda, eat more vegetables, use fresh or frozen vegetables, eat baked foods instead of fried foods, eat fruit for snacks and desserts, limit or avoid alcohol  (05/07/2010)     Activity: take a 30 minute walk every day, park at the far end of the parking lot  (05/07/2010)     Home glucose monitoring frequency: 1 time daily  (09/18/2009)    Diabetes self-management support: Written self-care plan  (05/07/2010)   Diabetes care plan printed    Hypertension self-management support: Written self-care plan  (05/07/2010)   Hypertension self-care plan printed.    Lipid self-management support: Written self-care plan  (05/07/2010)   Lipid self-care plan printed.   Nursing Instructions: HgbA1C today (see order)   Laboratory Results   Blood Tests   Date/Time Received: May 07, 2010 4:29 PM Date/Time Reported: Alric Quan  May 07, 2010 4:29 PM   HGBA1C:  6.3%   (Normal Range: Non-Diabetic - 3-6%   Control Diabetic - 6-8%)

## 2010-12-18 NOTE — Progress Notes (Signed)
Summary: phone/gg  Phone Note Call from Patient   Summary of Call: Pt called because she could not afford the meds ordered. I called pharmacy and they used the wrong insurance, they have corrected the error and meds are much less expensive. They will inform pt Initial call taken by: Merrie Roof RN,  May 08, 2010 11:06 AM

## 2010-12-18 NOTE — Progress Notes (Signed)
Summary: Refill/gh  Phone Note Refill Request Message from:  Pharmacy on January 19, 2010 4:33 PM  Refills Requested: Medication #1:  DIOVAN 160 MG TABS Take 1 tablet by mouth once a day   Last Refilled: 11/19/2009  Medication #2:  SYNTHROID 25 MCG TABS Take 1 tablet by mouth once daily   Last Refilled: 11/19/2009  Method Requested: Electronic Initial call taken by: Angelina Ok RN,  January 19, 2010 4:33 PM  Follow-up for Phone Call        Refill approved-nurse to complete    Prescriptions: SYNTHROID 25 MCG TABS (LEVOTHYROXINE SODIUM) Take 1 tablet by mouth once daily  #30 Tablet x 5   Entered and Authorized by:   Vassie Loll MD   Signed by:   Vassie Loll MD on 01/19/2010   Method used:   Electronically to        [facility]  [address] [id number]* (retail)       [address]       [place], [place]  [address]       Ph: [phone] or [phone]       Fax: [phone]   RxID:   [id number] DIOVAN 160 MG TABS (VALSARTAN) Take 1 tablet by mouth once a day  #30 Tablet x 5   Entered and Authorized by:   Vassie Loll MD   Signed by:   Vassie Loll MD on 01/19/2010   Method used:   Electronically to        [facility]  [address] [id number]* (retail)       [address]       [place], [place]  [address]       Ph: [phone] or [phone]       Fax: [phone]   RxID:   [id number]

## 2010-12-18 NOTE — Assessment & Plan Note (Signed)
Summary: EST-WANTS REGULAR CHECK UP/SINUS ISSUES/CFB   Vital Signs:  Patient profile:   77 year old female Height:      66 inches Weight:      122.3 pounds BMI:     19.81 Temp:     97.9 degrees F oral Pulse rate:   87 / minute BP sitting:   122 / 73  (right arm)  Vitals Entered By: Krystal Eaton Duncan Dull) (December 15, 2009 4:38 PM) CBG Result 73   Primary Care Provider:  Chauncey Reading DO   History of Present Illness: 77 y/o woman with pmh as described on the EMR[place] complaining of general malaise, productive cough, rhinorrea, sinuses congestion and tenderness and also with HA. Pt reports that her symptoms has been going on for about 4-5 weeks, on/off and just partially relieved with OTC medications. She is now having changes in the amount and color of her nasal dischargesand also expectoration  Pt is reporting some fever, and also mild sore  throat.  Patient is compliant with her medications and denies any other complaints.  Problems Prior to Update: 1)  Diabetes Mellitus, Type II  (ICD-250.00) 2)  Hypertension  (ICD-401.9) 3)  Hyperlipidemia  (ICD-272.4) 4)  Migraine Headache  (ICD-346.90) 5)  Hypothyroidism  (ICD-244.9) 6)  Bone Tumor  (ICD-239.2) 7)  Pulmonary Nodule  (ICD-518.89) 8)  Anemia-nos  (ICD-285.9) 9)  Polymyalgia Rheumatica  (ICD-725) 10)  Gerd  (ICD-530.81)  Medications Prior to Update: 1)  Hydrochlorothiazide 25 Mg Tabs (Hydrochlorothiazide) .... Take 1 Tablet By Mouth Once A Day 2)  Imipramine Hcl 50 Mg Tabs (Imipramine Hcl) .... Take 1 Tablet By Mouth Two Times A Day 3)  Synthroid 25 Mcg Tabs (Levothyroxine Sodium) .... Take 1 Tablet By Mouth Once Daily 4)  Glimepiride 1 Mg Tabs (Glimepiride) .... Take 1 Tablet By Mouth Once Daily 5)  Diovan 160 Mg Tabs (Valsartan) .... Take 1 Tablet By Mouth Once A Day 6)  Onetouch Ultrasoft Lancets   Misc (Lancets) .... Use As Directed To Check Your Sugars 7)  Onetouch Ultra Test   Strp (Glucose  Blood) .... Use As Directed To Check Your Sugars 8)  Tracer Ii 3 Volt Battery   Misc (Blood Glucose Monitoring Suppl) .... Needs 3 Volt Lithium Battery For One Touch Ultra Meter To Test Her Blood Glucose- Cpt Code A4254 9)  Oscal 500/200 D-3 500-200 Mg-Unit  Tabs (Calcium-Vitamin D) .... Take 1 Tablet By Mouth Three Times A Day 10)  Darvocet-N 100 100-650 Mg  Tabs (Propoxyphene N-Apap) .... Take 1 Tablet By Mouth Up To Every 6 Hours As Needed For Pain 11)  Lipitor 40 Mg Tabs (Atorvastatin Calcium) .... Take 1 Tab By Mouth At Bedtime 12)  Tramadol Hcl 50 Mg Tabs (Tramadol Hcl) .... Take 1 Tablet By Mouth Every 6 Hours As Needed For Pain. 13)  Nexium 40 Mg Cpdr (Esomeprazole Magnesium) .... Take 1 Tablet By Mouth Once A Day  Current Medications (verified): 1)  Hydrochlorothiazide 25 Mg Tabs (Hydrochlorothiazide) .... Take 1 Tablet By Mouth Once A Day 2)  Imipramine Hcl 50 Mg Tabs (Imipramine Hcl) .... Take 1 Tablet By Mouth Two Times A Day 3)  Synthroid 25 Mcg Tabs (Levothyroxine Sodium) .... Take 1 Tablet By Mouth Once Daily 4)  Glimepiride 1 Mg Tabs (Glimepiride) .... Take 1 Tablet By Mouth Once Daily 5)  Diovan 160 Mg Tabs (Valsartan) .... Take 1 Tablet By Mouth Once A Day 6)  Onetouch Ultrasoft Lancets   Misc (Lancets) .Marland KitchenMarland KitchenMarland Kitchen  Use As Directed To Check Your Sugars 7)  Onetouch Ultra Test   Strp (Glucose Blood) .... Use As Directed To Check Your Sugars 8)  Tracer Ii 3 Volt Battery   Misc (Blood Glucose Monitoring Suppl) .... Needs 3 Volt Lithium Battery For One Touch Ultra Meter To Test Her Blood Glucose- Cpt Code A4254 9)  Oscal 500/200 D-3 500-200 Mg-Unit  Tabs (Calcium-Vitamin D) .... Take 1 Tablet By Mouth Three Times A Day 10)  Lipitor 40 Mg Tabs (Atorvastatin Calcium) .... Take 1 Tab By Mouth At Bedtime 11)  Tramadol Hcl 50 Mg Tabs (Tramadol Hcl) .... Take 1 Tablet By Mouth Every 6 Hours As Needed For Pain. 12)  Nexium 40 Mg Cpdr (Esomeprazole Magnesium) .... Take 1 Tablet By Mouth Once A  Day  Allergies (verified): 1)  ! Asa 2)  ! Codeine  Past History:  Past Medical History: Last updated: 04/29/2008 Diabetes mellitus, type II GERD Hypertension Hypothyroidism Anemia-NOS Hyperlipidemia Temporal arteritis-suspected /Polymyalgia rheumatica Rotator cuff syndrome Sinus arrythmia Tinnitus Migraine headaches H. pylori infection Myoclonus, hx of Postmenopausal state Paresthesia, R 2-4th finger tingling Pulmonary scarring and nodules, likely postinflammatory Right clavicular mass, w/up in process 6/09  Past Surgical History: Last updated: 09/04/2006 Hysterectomy - TAH, 1976  Family History: Last updated: Apr 15, 2009 Mother died of colon cancer at age 42. 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    Physical Exam  General:  alert, well-developed, well-hydrated, and cooperative to examination.   Nose:  bilateral maxillary and frontal tenderness, somemild erythema inside her nose and mild yelowish discharge. Mouth:  pharynx pink and moist, no exudates, no aphthous ulcers, and no tongue abnormalities.   Lungs:  normal respiratory effort, no intercostal retractions, no accessory muscle use, normal breath sounds, positive ronchi but no wheezes.   Heart:  Normal rate and regular rhythm. S1 and S2 normal  without gallop, murmur, click, rub or other extra sounds. Abdomen:  Mild epigastric discomfort on palpation, no guarding, no rebounds; positive BS, no distension and negative hepato/splenomegaly. Extremities:  No edema. Neurologic:  alert & oriented X3, cranial nerves II-XII intact, strength normal in all extremities, sensation intact to light touch, and gait normal.     Impression & Recommendations:  Problem # 1:  UPPER RESPIRATORY INFECTION, ACUTE, WITH BRONCHITIS (ICD-465.9) Pt with symptoms and history of upper respiratory infection with bronchitis, which has escalated to bacterial infection at this time.There is also some sinusitis involved. Will treat with 10 days of erythromycin, good hydration, loratadine to helpwith sinuses controland also the useof tylenol and mucinex for comfort and symptoms control.  Her updated medication list for this problem includes:    Loratadine 10 Mg Tabs (Loratadine) .Marland Kitchen... Take 1 tablet by mouth once a day  Orders: T-CBC w/Diff ([id number])  Problem # 2:  DIABETES MELLITUS, TYPE II (ICD-250.00) A1C at goal. CBG 73 while in the clinic, she was  fasting; we gave her some juice and crackers. Will continue same medication regimen and will repeat A1C in 1 month.  Her updated medication list for this problem includes:    Glimepiride 1 Mg Tabs (Glimepiride) .Marland Kitchen... Take 1 tablet by mouth once daily    Diovan 160 Mg Tabs (Valsartan) .Marland Kitchen... Take 1 tablet by mouth once a day  Orders: T-Hgb A1C (in-house) ([id number]) T- Capillary Blood Glucose (11914)  Problem # 3:  HYPERTENSION (ICD-401.9) BP is at goal. Willcontinue HCTZ and diaovan same doses. Patient advised to keep herself hydrated and to stop medications in case she is unable to eat and drink properly (patient reports understanding). Will check renal function and electrolytes.  Her updated medication list for this problem includes:    Hydrochlorothiazide 25 Mg Tabs (Hydrochlorothiazide) .Marland Kitchen... Take 1 tablet  by mouth once a day    Diovan 160 Mg Tabs (Valsartan) .Marland Kitchen... Take 1 tablet by mouth once a day  BP today: 122/73 Prior BP: 142/62 (09/18/2009)  Labs Reviewed: K+: 4.3 (09/18/2009) Creat: : 1.02 (09/18/2009)   Chol: 129 (03/16/2009)   HDL: 44 (03/16/2009)   LDL: 60 (03/16/2009)   TG: 125 (03/16/2009)  Problem # 4:  HYPERLIPIDEMIA (ICD-272.4)  Will check lipidprofile and will adjust statin dose as needed. Last LDL was at goal. Patient encourage to follow a low fat diet.  Her updated medication list for this problem includes:    Lipitor 40 Mg Tabs (Atorvastatin calcium) .Marland Kitchen... Take 1 tab by mouth at bedtime  Orders: T-Lipid Profile [id number])  Labs Reviewed: SGOT: 18 (09/18/2009)   SGPT: 15 (09/18/2009)   HDL:44 (03/16/2009), 48 (08/16/2008)  LDL:60 (03/16/2009), 151 (86/57/8469)  Chol:129 (03/16/2009), 229 (08/16/2008)  Trig:125 (03/16/2009), 150 (08/16/2008)  Problem # 5:  HYPOTHYROIDISM (ICD-244.9) No symptoms of hypothyroidism at this point. Will check TSH and willcontinue synthroid with doses adjustment if needed.  Her updated medication list for this problem includes:    Synthroid 25 Mcg Tabs (Levothyroxine sodium) .Marland Kitchen... Take 1 tablet by mouth once daily  Orders: T-TSH ([id number])  Complete Medication List: 1)  Hydrochlorothiazide 25 Mg Tabs (Hydrochlorothiazide) .... Take 1 tablet by mouth once a day 2)  Imipramine Hcl 50 Mg Tabs (Imipramine hcl) .... Take 1 tablet by mouth two times a day 3)  Synthroid 25 Mcg Tabs (Levothyroxine sodium) .... Take 1 tablet by mouth once daily 4)  Glimepiride 1 Mg Tabs (Glimepiride) .... Take 1 tablet by mouth once daily 5)  Diovan 160 Mg Tabs (Valsartan) .... Take 1 tablet by mouth once a day 6)  Onetouch Ultrasoft Lancets Misc (Lancets) .... Use as directed to check your sugars 7)  Onetouch Ultra Test Strp (Glucose blood) .... Use as directed to check your sugars 8)  Tracer Ii 3 Volt Battery Misc (Blood glucose monitoring suppl) ....  Needs 3 volt lithium battery for one touch ultra meter to test her blood glucose- cpt code a4254 9)  Oscal 500/200 D-3 500-200 Mg-unit Tabs (Calcium-vitamin d) .... Take 1 tablet by mouth three times a day 10)  Lipitor 40 Mg Tabs (Atorvastatin calcium) .... Take 1 tab by mouth at bedtime 11)  Tramadol Hcl 50 Mg Tabs (Tramadol hcl) .... Take 1 tablet by mouth every 6 hours as needed for pain. 12)  Nexium 40 Mg Cpdr (Esomeprazole magnesium) .... Take 1 tablet by mouth once a day 13)  Zithromax 250 Mg Tabs (Azithromycin) .... Take 2 tablets by mouth daily for 3 dyas and then 1 tablet by mouth until antibiotics are  gone. 14)  Loratadine 10 Mg Tabs (Loratadine) .... Take 1 tablet by mouth once a day  Other Orders: T-Comprehensive Metabolic Panel ([id number])  Patient Instructions: 1)  Please keep your appointment with me next month to follow on your DM. 2)  Take your medications as prescribed. 3)  Get plenty of rest, drink lots of clear liquids, and use Tylenol multisymptoms up to 4 times a day for fever and comfort. Return in 10 days if you're not better:sooner if you're feeling worse. 4)  Increased your intake of Vit C supplements. 5)  You will be called with any abnormalities in the tests scheduled or performed today.  If you don't hear from [place] within a week from when the test was performed, you can assume that your test was normal. 6)  Take your antibiotic as prescribed until ALL of it is gone, but stop if you develop a rash or swelling and contact our office as soon as possible. 7)  Ok to use mucinex for cough control and to help with expectoration. Prescriptions: LORATADINE 10 MG TABS (LORATADINE) Take 1 tablet by mouth once a day  #31 x 1   Entered and Authorized by:   Vassie Loll MD   Signed by:   Vassie Loll MD on 12/15/2009   Method used:   Electronically to        [id number] (retail)       [address]       [place], [place]   [address]       Ph: [phone] or [phone]       Fax: [phone]   RxID:   [id number] ZITHROMAX 250 MG TABS (AZITHROMYCIN) Take 2 tablets by mouth daily for 3 dyas and then 1 tablet by mouth until antibiotics are gone.  #10 x 0   Entered and Authorized by:   Vassie Loll MD   Signed by:   Vassie Loll MD on 12/15/2009   Method used:   Electronically to        [id number] (retail)       [address]       [place], [place]  [address]       Ph: [phone] or [phone]       Fax: [phone]   RxID:   [id number]  Process Orders Check Orders Results:     [facility]: Check successful Tests Sent for requisitioning (December 15, 2009 5:07 PM):     12/15/2009: [facility] -- T-Lipid Profile [id number] (signed)     12/15/2009: [facility] -- T-Comprehensive Metabolic Panel [[id number]] (signed)     12/15/2009: [facility] -- T-CBC w/Diff [[id number]] (signed)     12/15/2009: [facility] -- T-TSH [phone] (signed)    Prevention & Chronic Care Immunizations   Influenza vaccine: Fluvax MCR  (09/18/2009)   Influenza vaccine deferral: Deferred  (05/16/2009)   Influenza vaccine due: 07/19/2010    Tetanus booster: 09/18/2009: Tdap   Td booster deferral: Deferred  (05/16/2009)   Tetanus booster due: 09/19/2019    Pneumococcal vaccine: Not documented   Pneumococcal vaccine deferral: Deferred  (05/16/2009)    H. zoster vaccine: Not documented   H. zoster vaccine deferral: Deferred  (05/16/2009)  Colorectal Screening   Hemoccult: Not documented   Hemoccult action/deferral: Deferred  (05/16/2009)   Hemoccult due: 05/16/2010  Colonoscopy: Location:  [facility].    (09/28/2008)   Colonoscopy action/deferral: Deferred  (05/16/2009)   Colonoscopy due: 09/28/2018  Other Screening   Pap smear: Not documented   Pap smear  action/deferral: Not indicated S/P hysterectomy  (05/16/2009)    Mammogram: ASSESSMENT: Negative - BI-RADS 1^MM DIGITAL SCREENING  (07/06/2009)    DXA bone density scan: Not documented   DXA bone density action/deferral: Ordered  (09/18/2009)   Smoking status: never  (09/18/2009)  Diabetes Mellitus   HgbA1C: 6.4  (12/15/2009)   HgbA1C action/deferral: Ordered  (05/16/2009)   Hemoglobin A1C due: 08/16/2009    Eye exam: No diabetic retinopathy.  OU   (07/06/2009)   Eye exam due: 07/2010    Foot exam: yes  (03/16/2009)   High risk foot: No  (03/25/2007)   Foot care education: Done  (03/16/2009)   Foot exam due: 05/16/2010    Urine microalbumin/creatinine ratio: 5.1  (03/16/2009)   Urine microalbumin action/deferral: Deferred   Urine microalbumin/cr due: 05/16/2010    Diabetes flowsheet reviewed?: Yes   Progress toward A1C goal: At goal  Lipids   Total Cholesterol: 129  (03/16/2009)   Lipid panel action/deferral: Lipid Panel ordered   LDL: 60  (03/16/2009)   LDL Direct: Not documented   HDL: 44  (03/16/2009)   Triglycerides: 125  (03/16/2009)    SGOT (AST): 18  (09/18/2009)   BMP action: Ordered   SGPT (ALT): 15  (09/18/2009) CMP ordered    Alkaline phosphatase: 111  (09/18/2009)   Total bilirubin: 0.5  (09/18/2009)    Lipid flowsheet reviewed?: Yes   Progress toward LDL goal: At goal  Hypertension   Last Blood Pressure: 122 / 73  (12/15/2009)   Serum creatinine: 1.02  (09/18/2009)   BMP action: Ordered   Serum potassium 4.3  (09/18/2009) CMP ordered     Hypertension flowsheet reviewed?: Yes   Progress toward BP goal: At goal  Self-Management Support :   Personal Goals (by the next clinic visit) :     Personal A1C goal: 6  (09/18/2009)     Personal blood pressure goal: 130/80  (09/18/2009)     Personal LDL goal: 70  (09/18/2009)    Patient will work on the following items until the next clinic visit to reach self-care goals:     Medications and  monitoring: take my medicines every day, check my blood sugar, examine my feet every day  (12/15/2009)     Eating: drink diet soda or water instead of juice or soda, eat more vegetables, use fresh or frozen vegetables, eat foods that are low in salt, eat fruit for snacks and desserts  (12/15/2009)     Activity: take a 30 minute walk every day  (12/15/2009)     Home glucose monitoring frequency: 1 time daily  (09/18/2009)    Diabetes self-management support: Written self-care plan  (12/15/2009)   Diabetes care plan printed    Hypertension self-management support: Written self-care plan  (12/15/2009)   Hypertension self-care plan printed.    Lipid self-management support: Written self-care plan  (12/15/2009)   Lipid self-care plan printed.  Laboratory Results   Blood Tests   Date/Time Received: December 15, 2009 4:46 PM Date/Time Reported: Alric Quan  December 15, 2009 4:46 PM   HGBA1C: 6.4%   (Normal Range: Non-Diabetic - 3-6%   Control Diabetic - 6-8%) CBG Random:: 73mg /dL

## 2010-12-18 NOTE — Assessment & Plan Note (Signed)
Summary: sob/gg   Vital Signs:  Patient profile:   77 year old female Height:      66 inches (167.64 cm) Weight:      136.8 pounds (62.18 kg) BMI:     22.16 O2 Sat:      100 % on Room air Temp:     97.1 degrees F (36.17 degrees C) oral Pulse rate:   72 / minute BP sitting:   157 / 73  (right arm)  Vitals Entered By: Stanton Kidney Ditzler RN (August 20, 2010 11:34 AM)  O2 Flow:  Room air Is Patient Diabetic? Yes Did you bring your meter with you today? No Pain Assessment Patient in pain? no      Nutritional Status BMI of 19 -24 = normal Nutritional Status Detail appetite good CBG Result 116  Have you ever been in a relationship where you felt threatened, hurt or afraid?denies   Does patient need assistance? Functional Status Self care Ambulation Normal Comments Past month SOB mostly with activity. Upper back pain past 3 weeks. Has wood stove at home. Nonproductive cough since last season.   Primary Care Provider:  Vassie Loll MD   History of Present Illness: 77 yr old woman with pmhx as described below comes to the clinic for complains of SOB that has been worsening for the last month. Reports to have a dry intermitent cough that started about 2 weeks ago but has been getting better. Denies any chest pain, coughing, hx of smoking, fever or chills, weigh loss, sinus drainage. Reports to have weight gain. Denies shortness of breath when she lays down.   Reports that Upper back pain started after sitting on the computer for a long time.   Low back pain resolved.   Depression History:      The patient denies a depressed mood most of the day and a diminished interest in her usual daily activities.         Preventive Screening-Counseling & Management  Alcohol-Tobacco     Smoking Status: never  Caffeine-Diet-Exercise     Does Patient Exercise: no     Type of exercise: ROM     Times/week: 1  Problems Prior to Update: 1)  Back Pain, Acute  (ICD-724.5) 2)  Renal  Insufficiency, Acute  (ICD-585.9) 3)  Flank Pain, Left  (ICD-789.09) 4)  Cough  (ICD-786.2) 5)  Upper Respiratory Infection, Acute, With Bronchitis  (ICD-465.9) 6)  Diabetes Mellitus, Type II  (ICD-250.00) 7)  Hypertension  (ICD-401.9) 8)  Hyperlipidemia  (ICD-272.4) 9)  Migraine Headache  (ICD-346.90) 10)  Hypothyroidism  (ICD-244.9) 11)  Bone Tumor  (ICD-239.2) 12)  Pulmonary Nodule  (ICD-518.89) 13)  Anemia-nos  (ICD-285.9) 14)  Polymyalgia Rheumatica  (ICD-725) 15)  Gerd  (ICD-530.81)  Medications Prior to Update: 1)  Hydrochlorothiazide 25 Mg Tabs (Hydrochlorothiazide) .... Take 1 Tablet By Mouth Once A Day 2)  Imipramine Hcl 50 Mg Tabs (Imipramine Hcl) .... Take 1 Tablet By Mouth Two Times A Day 3)  Synthroid 25 Mcg Tabs (Levothyroxine Sodium) .... Take 1 Tablet By Mouth Once Daily 4)  Glimepiride 1 Mg Tabs (Glimepiride) .... Take 1 Tablet By Mouth Once Daily 5)  Diovan 160 Mg Tabs (Valsartan) .... Take 1 Tablet By Mouth Once A Day 6)  Onetouch Ultrasoft Lancets   Misc (Lancets) .... Use As Directed To Check Your Sugars 7)  Onetouch Ultra Test   Strp (Glucose Blood) .... Use As Directed To Check Your Sugars 8)  Tracer Ii 3 Volt  Battery   Misc (Blood Glucose Monitoring Suppl) .... Needs 3 Volt Lithium Battery For One Touch Ultra Meter To Test Her Blood Glucose- Cpt Code A4254 9)  Oscal 500/200 D-3 500-200 Mg-Unit  Tabs (Calcium-Vitamin D) .... Take 1 Tablet By Mouth Three Times A Day 10)  Lipitor 40 Mg Tabs (Atorvastatin Calcium) .... Take 1 Tab By Mouth At Bedtime 11)  Nexium 40 Mg Cpdr (Esomeprazole Magnesium) .... Take 1 Tablet By Mouth Once A Day 12)  Cyclobenzaprine Hcl 5 Mg Tabs (Cyclobenzaprine Hcl) .... Take 1 Tablet By Mouth Once A Day At Night. You May Take 1 Pill During Day Time If in Acute Pain.  Current Medications (verified): 1)  Hydrochlorothiazide 25 Mg Tabs (Hydrochlorothiazide) .... Take 1 Tablet By Mouth Once A Day 2)  Imipramine Hcl 50 Mg Tabs (Imipramine Hcl)  .... Take 1 Tablet By Mouth Two Times A Day 3)  Synthroid 25 Mcg Tabs (Levothyroxine Sodium) .... Take 1 Tablet By Mouth Once Daily 4)  Glimepiride 1 Mg Tabs (Glimepiride) .... Take 1 Tablet By Mouth Once Daily 5)  Diovan 160 Mg Tabs (Valsartan) .... Take 1 Tablet By Mouth Once A Day 6)  Onetouch Ultrasoft Lancets   Misc (Lancets) .... Use As Directed To Check Your Sugars 7)  Onetouch Ultra Test   Strp (Glucose Blood) .... Use As Directed To Check Your Sugars 8)  Tracer Ii 3 Volt Battery   Misc (Blood Glucose Monitoring Suppl) .... Needs 3 Volt Lithium Battery For One Touch Ultra Meter To Test Her Blood Glucose- Cpt Code A4254 9)  Oscal 500/200 D-3 500-200 Mg-Unit  Tabs (Calcium-Vitamin D) .... Take 1 Tablet By Mouth Three Times A Day 10)  Lipitor 40 Mg Tabs (Atorvastatin Calcium) .... Take 1 Tab By Mouth At Bedtime 11)  Nexium 40 Mg Cpdr (Esomeprazole Magnesium) .... Take 1 Tablet By Mouth Once A Day 12)  Cyclobenzaprine Hcl 5 Mg Tabs (Cyclobenzaprine Hcl) .... Take 1 Tablet By Mouth Once A Day At Night. You May Take 1 Pill During Day Time If in Acute Pain.  Allergies: 1)  ! Asa 2)  ! Codeine  Past History:  Past Medical History: Last updated: 04/29/2008 Diabetes mellitus, type II GERD Hypertension Hypothyroidism Anemia-NOS Hyperlipidemia Temporal arteritis-suspected /Polymyalgia rheumatica Rotator cuff syndrome Sinus arrythmia Tinnitus Migraine headaches H. pylori infection Myoclonus, hx of Postmenopausal state Paresthesia, R 2-4th finger tingling Pulmonary scarring and nodules, likely postinflammatory Right clavicular mass, w/up in process 6/09  Past Surgical History: Last updated: 09/04/2006 Hysterectomy - TAH, 1976  Family History: Last updated: 03-22-09 Mother died of colon cancer at age 63. 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 Mother died of colon cancer at age 65. F passed at 54 from emphysema 4 half sisters, 1 with AD, 1 passed with AD, 2 unknown 5 children, 1 son passed from emphysema and PAH; 1 daughter s/p lung transplant for [facility] (died 03-06-09), other daughter and 2 sons are healthy  Social History: Reviewed history from 10/17/2008 and no changes required. 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  The patient denies fever, weight loss, chest pain, peripheral edema, headaches, hemoptysis, abdominal pain, melena, hematochezia, hematuria, muscle weakness, and difficulty walking.    Physical Exam  General:  NAD Neck:  supple and no JVD.   Lungs:  fine bibasilar crackles, Good air movement, no intercostal retractions and no accessory muscle use Heart:  normal rate and regular rhythm, SEM II/VI at RSB.   Abdomen:  soft, non-tender, and normal bowel sounds.   Msk:  normal ROM.   Extremities:  No edema Neurologic:  Nonfocal   Impression & Recommendations:  Problem # 1:  DYSPNEA ON EXERTION (ICD-786.09) Concerned for Interstitial lung disease with patient's history of using wood stove for 50 yrs. No evidence to suggest bacterial infection, but will check chest xray to rule out pneumonia. Differential includes Acute bronchitis. Will treat supportively for now and consider Pulmonary referral  based on PFT results.   Her updated medication list for this problem includes:    Hydrochlorothiazide 25 Mg Tabs (Hydrochlorothiazide) .Marland Kitchen... Take 1 tablet by mouth once a day  Orders: PFT Baseline (PFT Baseline)  Problem # 2:  BACK PAIN, ACUTE (ICD-724.5) Most likely musculoskeletal. Continue supportive treatment.  Her updated medication list for this problem includes:    Cyclobenzaprine Hcl 5 Mg Tabs (Cyclobenzaprine hcl) .Marland Kitchen... Take 1 tablet by mouth once a day at night. you may take 1 pill during day time if in acute pain.  Problem # 3:  COUGH (ICD-786.2) As per number 1. Supportive treatment. Rule out pneumonia.   Orders: T-CBC w/Diff ([id number]) CXR- 2view (CXR)  Problem # 4:  HYPERTENSION (ICD-401.9) Elevated. Will consider adding Norvasc on follow up in two weeks if BP continues to be elevated.  Her updated medication list for this problem includes:    Hydrochlorothiazide 25 Mg Tabs (Hydrochlorothiazide) .Marland Kitchen... Take 1 tablet by mouth once a day    Diovan 160 Mg Tabs (Valsartan) .Marland Kitchen... Take 1 tablet by mouth once a day  BP today: 157/73 Prior BP: 157/76 (05/25/2010)  Labs Reviewed: K+: 4.4 (05/07/2010) Creat: : 1.16 (05/07/2010)   Chol: 150 (05/07/2010)   HDL: 43 (05/07/2010)   LDL: 74 (05/07/2010)   TG: 165 (05/07/2010)  Problem # 5:  HYPERLIPIDEMIA (ICD-272.4) AT goal. Continue current regimen.  Her updated medication list for this problem includes:    Lipitor 40 Mg Tabs (Atorvastatin calcium) .Marland Kitchen... Take 1 tab by mouth at bedtime  Labs Reviewed: SGOT: 16 (05/07/2010)   SGPT: 14 (05/07/2010)   HDL:43 (05/07/2010), 42 (12/15/2009)  LDL:74 (05/07/2010), 86 (12/15/2009)  Chol:150 (05/07/2010), 150 (12/15/2009)  Trig:165 (05/07/2010), 111 (12/15/2009)  Problem # 6:  DIABETES MELLITUS, TYPE II (ICD-250.00) Good control. Continue current regimen.  Her updated medication list for this problem includes:    Glimepiride 1 Mg Tabs (Glimepiride) .Marland Kitchen... Take 1 tablet by mouth  once daily    Diovan 160 Mg Tabs (Valsartan) .Marland Kitchen... Take 1 tablet by mouth once a day  Orders: T- Capillary Blood Glucose (09811) T-Hgb A1C (in-house) ([id number])  Labs Reviewed: Creat: 1.16 (05/07/2010)     Last Eye Exam: No diabetic retinopathy.  OU  (07/06/2009) Reviewed HgBA1c results: 6.8 (08/20/2010)  6.3 (05/07/2010)  Problem # 7:  HYPOTHYROIDISM (ICD-244.9) Stable. Continue current regimen.  Her updated medication list for this problem includes:    Synthroid 25 Mcg Tabs (Levothyroxine sodium) .Marland Kitchen... Take 1 tablet by mouth once daily  Labs Reviewed: TSH: 3.410 (05/07/2010)    HgBA1c: 6.8 (08/20/2010) Chol: 150 (05/07/2010)   HDL: 43 (05/07/2010)   LDL: 74 (05/07/2010)   TG: 165 (05/07/2010)  Problem # 8:  ANEMIA-NOS (ICD-285.9) Recheck CBC and reasses.  Hgb: 12.6 (12/15/2009)   Hct: 40.6 (12/15/2009)   Platelets: 324 (12/15/2009) RBC: 4.66 (12/15/2009)   RDW: 14.4 (12/15/2009)   WBC: 6.2 (12/15/2009) MCV: 87.1 (12/15/2009)   MCHC: 31.0 (12/15/2009) Ferritin: 208 (10/21/2006) B12: 901 (05/07/2010)   TSH: 3.410 (05/07/2010)  Complete Medication List: 1)  Hydrochlorothiazide 25 Mg Tabs (Hydrochlorothiazide) .... Take 1 tablet by mouth once a day 2)  Imipramine Hcl 50 Mg Tabs (Imipramine hcl) .... Take 1 tablet by mouth two times a day 3)  Synthroid 25 Mcg Tabs (Levothyroxine sodium) .... Take 1 tablet by mouth once daily 4)  Glimepiride 1 Mg Tabs (Glimepiride) .... Take 1 tablet by mouth once daily 5)  Diovan 160 Mg Tabs (Valsartan) .... Take 1 tablet by mouth once a day 6)  Onetouch Ultrasoft Lancets Misc (Lancets) .... Use as directed to check your sugars 7)  Onetouch Ultra Test Strp (Glucose blood) .... Use as directed to check your sugars 8)  Tracer Ii 3 Volt Battery Misc (Blood glucose monitoring suppl) .... Needs 3 volt lithium battery for one touch ultra meter to test her blood glucose- cpt code a4254 9)  Oscal 500/200 D-3 500-200 Mg-unit Tabs (Calcium-vitamin d)  .... Take 1 tablet by mouth three times a day 10)  Lipitor 40 Mg Tabs (Atorvastatin calcium) .... Take 1 tab by mouth at bedtime 11)  Nexium 40 Mg Cpdr (Esomeprazole magnesium) .... Take 1 tablet by mouth once a day 12)  Cyclobenzaprine Hcl 5 Mg Tabs (Cyclobenzaprine hcl) .... Take 1 tablet by mouth once a day at night. you may take 1 pill during day time if in acute pain.  Other Orders: Influenza Vaccine MCR (16109)  Patient Instructions: 1)  Please schedule a follow-up appointment in 2 weeks. 2)  Take all medication as directed. 3)  You will be called with any abnormalities in the tests scheduled or performed today.  If you don't hear from [place] within a week from when the test was performed, you can assume that your test was normal.  Process Orders Check Orders Results:     [facility]: Check successful Tests Sent for requisitioning (August 20, 2010 4:13 PM):     08/20/2010: [facility] -- [facility] w/Diff [[id number]] (signed)     Prevention & Chronic Care Immunizations   Influenza vaccine: Fluvax MCR  (08/20/2010)   Influenza vaccine deferral: Deferred  (05/25/2010)   Influenza vaccine due: 07/19/2010    Tetanus booster: 09/18/2009: Tdap   Td booster deferral: Deferred  (05/07/2010)   Tetanus booster due: 09/19/2019    Pneumococcal vaccine: Not documented   Pneumococcal vaccine deferral: Deferred  (05/25/2010)    H. zoster vaccine: Not documented   H. zoster vaccine deferral: Refused  (05/25/2010)  Colorectal Screening   Hemoccult: Not documented   Hemoccult action/deferral: Deferred  (05/16/2009)   Hemoccult due: 05/16/2010    Colonoscopy: Location:  [facility].    (09/28/2008)   Colonoscopy action/deferral: Deferred  (05/16/2009)   Colonoscopy due: 09/28/2018  Other Screening   Pap smear: Not documented   Pap smear  action/deferral: Not indicated S/P hysterectomy  (05/16/2009)    Mammogram: ASSESSMENT: Negative -  BI-RADS 1^MM DIGITAL SCREENING  (07/06/2009)    DXA bone density scan: Not documented   DXA bone density action/deferral: Deferred  (05/25/2010)   Smoking status: never  (08/20/2010)  Diabetes Mellitus   HgbA1C: 6.8  (08/20/2010)   HgbA1C action/deferral: Ordered  (05/07/2010)   Hemoglobin A1C due: 08/16/2009    Eye exam: No diabetic retinopathy.  OU   (07/06/2009)   Eye exam due: 07/2010    Foot exam: yes  (05/07/2010)   Foot exam action/deferral: Do today   High risk foot: No  (03/25/2007)   Foot care education: Done  (03/16/2009)   Foot exam due: 05/16/2010    Urine microalbumin/creatinine ratio: 4.8  (05/07/2010)   Urine microalbumin action/deferral: Ordered   Urine microalbumin/cr due: 05/16/2010    Diabetes flowsheet reviewed?: Yes   Progress toward A1C goal: At goal  Lipids   Total Cholesterol: 150  (05/07/2010)   Lipid panel action/deferral: Lipid Panel ordered   LDL: 74  (05/07/2010)   LDL Direct: Not documented   HDL: 43  (05/07/2010)   Triglycerides: 165  (05/07/2010)    SGOT (AST): 16  (05/07/2010)   BMP action: Ordered   SGPT (ALT): 14  (05/07/2010)   Alkaline phosphatase: 98  (05/07/2010)   Total bilirubin: 0.3  (05/07/2010)    Lipid flowsheet reviewed?: Yes   Progress toward LDL goal: At goal  Hypertension   Last Blood Pressure: 157 / 73  (08/20/2010)   Serum creatinine: 1.16  (05/07/2010)   BMP action: Ordered   Serum potassium 4.4  (05/07/2010)    Hypertension flowsheet reviewed?: Yes   Progress toward BP goal: Deteriorated  Self-Management Support :   Personal Goals (by the next clinic visit) :     Personal A1C goal: 6  (09/18/2009)     Personal blood pressure goal: 130/80  (09/18/2009)     Personal LDL goal: 70  (09/18/2009)    Patient will work on the following items until the next clinic visit to reach self-care goals:     Medications and monitoring: take my medicines every day, check my blood sugar, check my blood pressure, bring  all of my medications to every visit, weigh myself weekly, examine my feet every day  (08/20/2010)     Eating: drink diet soda or water instead of juice or soda, eat more vegetables, use fresh or frozen vegetables, eat foods that are low in salt, eat baked foods instead of fried foods, eat fruit for snacks and desserts, limit or avoid alcohol  (08/20/2010)     Activity: take a 30 minute walk every day, park at the far end of the parking lot  (08/20/2010)     Home glucose monitoring frequency: 1 time daily  (09/18/2009)    Diabetes self-management support: Written self-care plan, Education handout, Resources for patients handout  (08/20/2010)   Diabetes care plan printed   Diabetes education handout printed    Hypertension self-management support: Written self-care plan, Education handout, Resources for patients handout  (08/20/2010)   Hypertension self-care plan printed.   Hypertension education handout printed    Lipid self-management support: Written self-care plan, Education handout, Resources for patients handout  (08/20/2010)   Lipid self-care plan printed.   Lipid education handout printed      Resource handout printed.    Last LDL:  74 (05/07/2010 10:21:00 PM)        Diabetic Foot Exam Comments: Pt waring panty hose - having SOB with activity prefers not to do today.   Debra Ditzler RN   Laboratory Results   Blood Tests   Date/Time Received: August 20, 2010 11:44 AM  Date/Time Reported: Burke Keels  August 20, 2010 11:44 AM   HGBA1C: 6.8%   (Normal Range: Non-Diabetic - 3-6%   Control Diabetic - 6-8%) CBG Random:: 116mg /dL      Influenza Vaccine    Vaccine Type: Fluvax MCR    Site: left deltoid    Mfr: [employer]oSmith[employer]    Dose: 0.5 ml    Route: IM    Given by: Stanton Kidney Ditzler RN    Exp. Date: 05/18/2011    Lot #: [id number]    VIS given: 06/12/10 version given August 20, 2010.  Flu Vaccine Consent  Questions    Do you have a history of severe allergic reactions to this vaccine? no    Any prior history of allergic reactions to egg and/or gelatin? no    Do you have a sensitivity to the preservative Thimersol? no    Do you have a past history of Guillan-Barre Syndrome? no    Do you currently have an acute febrile illness? no    Have you ever had a severe reaction to latex? no    Vaccine information given and explained to patient? yes    Are you currently pregnant? no

## 2010-12-18 NOTE — Assessment & Plan Note (Signed)
Summary: f/u on cough/gg   Vital Signs:  Patient profile:   77 year old female Height:      66 inches (167.64 cm) Weight:      123.8 pounds (55.59 kg) BMI:     19.81 Temp:     97.5 degrees F (36.39 degrees C) oral Pulse rate:   97 / minute BP sitting:   133 / 66  (right arm) Cuff size:   regular  Vitals Entered By: Theotis Barrio NT II (December 25, 2009 10:10 AM) CC: PRODUCTIVE COUGH SINCE NOV. 010 (PER PATIENT) /  PAIN IN BACK SINCE THURSDAY Is Patient Diabetic? Yes Did you bring your meter with you today? No Pain Assessment Patient in pain? yes     Location: BACK  Intensity:    3 Type: BURN/STING Onset of pain  THIS PAST THURSDAY Nutritional Status BMI of 19 -24 = normal CBG Result 114 CBG Device ID Pt OWN DEVICE  Have you ever been in a relationship where you felt threatened, hurt or afraid?No   Does patient need assistance? Functional Status Self care Ambulation Normal Comments MEDICATION REFILL /  BACK PAIN # 3 -BURNING, STING TYPE PAIN /  COUGH SINCE NOV.  010. HAS BEEN SEEN AND TREATED FOR COUGH , WITH NO RELIEF.   Primary Care Provider:  Vassie Loll MD  CC:  PRODUCTIVE COUGH SINCE NOV. 010 (PER PATIENT) /  PAIN IN BACK SINCE THURSDAY.  History of Present Illness: Pt is a 77 yo female w/ past med hx below here for f/u of cough.  She notes productive cough of clear/yellow sputum that has been ongoing for 4-6 weeks.  She denies chest pain and fevers.  She has had rhinorrhea the same length of time as the cough, sore throat x 4 days.  She notes feeling weak over this time.  No GI symptoms.  She completed the course of azithro and mucinex without relief of her symptoms.    She also notes L sided flank pain starting 4 days ago that is intermittent in nature.  Described as burning, tingling, and aching.  No rash.   Depression History:      The patient denies a depressed mood most of the day and a diminished interest in her usual daily activities.          Preventive Screening-Counseling & Management  Alcohol-Tobacco     Smoking Status: never  Caffeine-Diet-Exercise     Does Patient Exercise: no     Type of exercise: ROM     Times/week: 1  Current Medications (verified): 1)  Hydrochlorothiazide 25 Mg Tabs (Hydrochlorothiazide) .... Take 1 Tablet By Mouth Once A Day 2)  Imipramine Hcl 50 Mg Tabs (Imipramine Hcl) .... Take 1 Tablet By Mouth Two Times A Day 3)  Synthroid 25 Mcg Tabs (Levothyroxine Sodium) .... Take 1 Tablet By Mouth Once Daily 4)  Glimepiride 1 Mg Tabs (Glimepiride) .... Take 1 Tablet By Mouth Once Daily 5)  Diovan 160 Mg Tabs (Valsartan) .... Take 1 Tablet By Mouth Once A Day 6)  Onetouch Ultrasoft Lancets   Misc (Lancets) .... Use As Directed To Check Your Sugars 7)  Onetouch Ultra Test   Strp (Glucose Blood) .... Use As Directed To Check Your Sugars 8)  Tracer Ii 3 Volt Battery   Misc (Blood Glucose Monitoring Suppl) .... Needs 3 Volt Lithium Battery For One Touch Ultra Meter To Test Her Blood Glucose- Cpt Code A4254 9)  Oscal 500/200 D-3 500-200 Mg-Unit  Tabs (  Calcium-Vitamin D) .... Take 1 Tablet By Mouth Three Times A Day 10)  Lipitor 40 Mg Tabs (Atorvastatin Calcium) .... Take 1 Tab By Mouth At Bedtime 11)  Nexium 40 Mg Cpdr (Esomeprazole Magnesium) .... Take 1 Tablet By Mouth Once A Day  Allergies (verified): 1)  ! Asa 2)  ! Codeine  Past History:  Past Medical History: Last updated: 04/29/2008 Diabetes mellitus, type II GERD Hypertension Hypothyroidism Anemia-NOS Hyperlipidemia Temporal arteritis-suspected /Polymyalgia rheumatica Rotator cuff syndrome Sinus arrythmia Tinnitus Migraine headaches H. 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 Mother died of colon cancer at age 36. F passed at 5 from emphysema 4 half sisters, 1 with AD, 1 passed with AD, 2 unknown 5 children, 1 son passed from emphysema and PAH; 1 daughter s/p lung transplant for [facility] (died 2009-03-20), other daughter and 2 sons are healthy  Social History: Reviewed history from 10/17/2008 and no changes required. 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  Physical Exam  General:  alert, coughing occasinally, no distress.  Eyes:  anicteric, conjunctiva not injected.  Ears:  L TM impacted w/ cerumen, removed.  R canal w/ minimal cerumen.  TM's nl.  Nose:  no sinus tenderness Mouth:  MMM, no OP exudates. Neck:  no LAD Lungs:  normal respiratory effort, normal breath sounds, no crackles, and no wheezes.   Heart:  normal rate and regular rhythm, SEM II/VI at RSB.   Abdomen:  +BS's, softs, NT and ND. Msk:  Back without rash.  No CVA tenderness.  Extremities:  no edema   Impression & Recommendations:  Problem # 1:  COUGH (ICD-786.2) I suspect this is a subacute cough related to recent URI.  However, given that it has lasted several weeks, will f/u CXR.  Cont PPI in case GERD contributing.  Will try 2 week course of cough suppressant(tolerated tussionex in the past) b/c hx points toward post-infectious cough that usually lasts several weeks(4-8 weeks) and spontaneously resolves.  F/u in 2 weeks.  Orders: CXR- 2view (CXR)  Problem # 2:  RENAL INSUFFICIENCY, ACUTE (ICD-585.9) Will f/u repeat cr today  since last value was a little elevated.   Problem # 3:  FLANK PAIN, LEFT (ICD-789.09) Likely msk.  Will check UA to look for blood as nephrolithiasis could cause this. No evidence on hx for pyelo.   The following medications were removed from the medication list:    Tramadol Hcl 50 Mg Tabs (Tramadol hcl) .Marland Kitchen... Take 1 tablet by mouth every 6 hours as needed for pain.  Orders: T-Urinalysis ([id number]) T-Basic Metabolic Panel ([id number])  Problem # 4:  DIABETES MELLITUS, TYPE II (ICD-250.00) Well controlled.  Repeat a1c at next visit.  Her updated medication list for this problem includes:    Glimepiride 1 Mg Tabs (Glimepiride) .Marland Kitchen... Take 1 tablet by mouth once daily  Diovan 160 Mg Tabs (Valsartan) .Marland Kitchen... Take 1 tablet by mouth once a day  Labs Reviewed: Creat: 1.35 (12/15/2009)     Last Eye Exam: No diabetic retinopathy.  OU  (07/06/2009) Reviewed HgBA1c results: 6.4 (12/15/2009)  6.4 (09/18/2009)  Complete Medication List: 1)  Hydrochlorothiazide 25 Mg Tabs (Hydrochlorothiazide) .... Take 1 tablet by mouth once a day 2)  Imipramine Hcl 50 Mg Tabs (Imipramine hcl) .... Take 1 tablet by mouth two times a day 3)  Synthroid 25 Mcg Tabs (Levothyroxine sodium) .... Take 1 tablet by mouth once daily 4)  Glimepiride 1 Mg Tabs (Glimepiride) .... Take 1 tablet by mouth once daily 5)  Diovan 160 Mg Tabs (Valsartan) .... Take 1 tablet by mouth once a day 6)  Onetouch Ultrasoft Lancets Misc (Lancets) .... Use as directed to check your sugars 7)  Onetouch Ultra Test Strp (Glucose blood) .... Use as directed to check your sugars 8)  Tracer Ii 3 Volt Battery Misc (Blood glucose monitoring suppl) .... Needs 3 volt lithium battery for one touch ultra meter to test her blood glucose- cpt code a4254 9)  Oscal 500/200 D-3 500-200 Mg-unit Tabs (Calcium-vitamin d) .... Take 1 tablet by mouth three times a day 10)  Lipitor 40 Mg Tabs (Atorvastatin calcium) .... Take 1 tab by mouth at bedtime 11)   Nexium 40 Mg Cpdr (Esomeprazole magnesium) .... Take 1 tablet by mouth once a day 12)  Tussionex Pennkinetic Er 8-10 Mg/22ml Lqcr (Chlorpheniramine-hydrocodone) .... Take 5 ml by mouth twice a day as needed for cough.  Patient Instructions: 1)  Please make a followup appointment in 2 weeks for a checkup. 2)  Call if you can not get your medicines. 3)  You will be called with any abnormal labwork.  Please make sure your phone number is correct at the front desk. 4)  Don't drive after you take your cough syrup. Prescriptions: TUSSIONEX PENNKINETIC ER 8-10 MG/5ML LQCR (CHLORPHENIRAMINE-HYDROCODONE) Take 5 mL by mouth twice a day as needed for cough.  #70 mL x 0   Entered and Authorized by:   Joaquin Courts  MD   Signed by:   Joaquin Courts  MD on 12/25/2009   Method used:   Print then Give to Patient   RxID:   [id number]  Process Orders Check Orders Results:     [facility]: Check successful Tests Sent for requisitioning (December 25, 2009 5:56 PM):     12/25/2009: [facility] -- T-Urinalysis [[id number]] (signed)     12/25/2009: [facility] -- T-Basic Metabolic Panel [phone] (signed)    Prevention & Chronic Care Immunizations   Influenza vaccine: Fluvax MCR  (09/18/2009)   Influenza vaccine deferral: Deferred  (05/16/2009)   Influenza vaccine due: 07/19/2010    Tetanus booster: 09/18/2009: Tdap   Td booster deferral: Deferred  (05/16/2009)   Tetanus booster due: 09/19/2019    Pneumococcal vaccine: Not documented   Pneumococcal vaccine deferral: Deferred  (05/16/2009)    H. zoster vaccine: Not documented   H. zoster vaccine deferral: Deferred  (05/16/2009)  Colorectal Screening   Hemoccult: Not documented   Hemoccult action/deferral: Deferred  (05/16/2009)   Hemoccult due: 05/16/2010    Colonoscopy: Location:  [facility].    (09/28/2008)   Colonoscopy action/deferral: Deferred  (05/16/2009)    Colonoscopy due: 09/28/2018  Other Screening   Pap smear: Not documented   Pap smear action/deferral: Not indicated S/P hysterectomy  (05/16/2009)    Mammogram: ASSESSMENT: Negative - BI-RADS 1^MM  DIGITAL SCREENING  (07/06/2009)    DXA bone density scan: Not documented   DXA bone density action/deferral: Ordered  (09/18/2009)   Smoking status: never  (12/25/2009)  Diabetes Mellitus   HgbA1C: 6.4  (12/15/2009)   HgbA1C action/deferral: Ordered  (05/16/2009)   Hemoglobin A1C due: 08/16/2009    Eye exam: No diabetic retinopathy.  OU   (07/06/2009)   Eye exam due: 07/2010    Foot exam: yes  (03/16/2009)   High risk foot: No  (03/25/2007)   Foot care education: Done  (03/16/2009)   Foot exam due: 05/16/2010    Urine microalbumin/creatinine ratio: 5.1  (03/16/2009)   Urine microalbumin action/deferral: Deferred   Urine microalbumin/cr due: 05/16/2010    Diabetes flowsheet reviewed?: Yes   Progress toward A1C goal: At goal  Lipids   Total Cholesterol: 150  (12/15/2009)   Lipid panel action/deferral: Lipid Panel ordered   LDL: 86  (12/15/2009)   LDL Direct: Not documented   HDL: 42  (12/15/2009)   Triglycerides: 111  (12/15/2009)    SGOT (AST): 20  (12/15/2009)   BMP action: Ordered   SGPT (ALT): 18  (12/15/2009)   Alkaline phosphatase: 139  (12/15/2009)   Total bilirubin: 0.5  (12/15/2009)    Lipid flowsheet reviewed?: Yes   Progress toward LDL goal: At goal  Hypertension   Last Blood Pressure: 133 / 66  (12/25/2009)   Serum creatinine: 1.35  (12/15/2009)   BMP action: Ordered   Serum potassium 4.5  (12/15/2009)    Hypertension flowsheet reviewed?: Yes   Progress toward BP goal: At goal  Self-Management Support :   Personal Goals (by the next clinic visit) :     Personal A1C goal: 6  (09/18/2009)     Personal blood pressure goal: 130/80  (09/18/2009)     Personal LDL goal: 70  (09/18/2009)    Patient will work on the following items until the next clinic  visit to reach self-care goals:     Medications and monitoring: take my medicines every day, check my blood sugar, bring all of my medications to every visit, examine my feet every day  (12/25/2009)     Eating: drink diet soda or water instead of juice or soda, eat more vegetables, use fresh or frozen vegetables, eat foods that are low in salt, eat baked foods instead of fried foods, eat fruit for snacks and desserts, limit or avoid alcohol  (12/25/2009)     Activity: take a 30 minute walk every day  (12/15/2009)     Home glucose monitoring frequency: 1 time daily  (09/18/2009)    Diabetes self-management support: Written self-care plan  (12/15/2009)    Hypertension self-management support: Written self-care plan  (12/15/2009)    Lipid self-management support: Written self-care plan  (12/15/2009)

## 2010-12-18 NOTE — Progress Notes (Signed)
Summary: Refill/gh  Phone Note Refill Request Message from:  Fax from Pharmacy on Apr 17, 2010 3:36 PM  Refills Requested: Medication #1:  GLIMEPIRIDE 1 MG TABS Take 1 tablet by mouth once daily   Last Refilled: 03/12/2010  Method Requested: Electronic Initial call taken by: Angelina Ok RN,  Apr 17, 2010 3:36 PM  Follow-up for Phone Call        Refill approved-nurse to complete    Prescriptions: GLIMEPIRIDE 1 MG TABS (GLIMEPIRIDE) Take 1 tablet by mouth once daily  #30 Tablet x 5   Entered and Authorized by:   Vassie Loll MD   Signed by:   Vassie Loll MD on 04/17/2010   Method used:   Electronically to        [facility]  [address] [id number]* (retail)       [address]       [place], [place]  [address]       Ph: [phone] or [phone]       Fax: [phone]   RxID:   [id number]

## 2010-12-18 NOTE — Progress Notes (Signed)
Summary: L leg/ hla  Phone Note Call from Patient   Summary of Call: pt calls and leaves message concerning L leg pain, rtc, i left message for pt to call clinic Initial call taken by: Marin Roberts RN,  October 05, 2010 3:59 PM

## 2010-12-18 NOTE — Progress Notes (Signed)
Summary: refill/ hla  Phone Note Refill Request Message from:  Fax from Pharmacy on July 13, 2010 5:21 PM  Refills Requested: Medication #1:  DIOVAN 160 MG TABS Take 1 tablet by mouth once a day   Dosage confirmed as above?Dosage Confirmed   Last Refilled: 7/22  Medication #2:  SYNTHROID 25 MCG TABS Take 1 tablet by mouth once daily   Dosage confirmed as above?Dosage Confirmed   Last Refilled: 7/22 Initial call taken by: Marin Roberts RN,  July 13, 2010 5:22 PM  Follow-up for Phone Call        Refill approved-nurse to complete    Prescriptions: DIOVAN 160 MG TABS (VALSARTAN) Take 1 tablet by mouth once a day  #30 Tablet x 5   Entered and Authorized by:   Vassie Loll MD   Signed by:   Vassie Loll MD on 07/14/2010   Method used:   Electronically to        [facility]  [address] [id number]* (retail)       [address]       [place], [place]  [address]       Ph: [phone] or [phone]       Fax: [phone]   RxID:   [id number] SYNTHROID 25 MCG TABS (LEVOTHYROXINE SODIUM) Take 1 tablet by mouth once daily  #30 Tablet x 5   Entered and Authorized by:   Vassie Loll MD   Signed by:   Vassie Loll MD on 07/14/2010   Method used:   Electronically to        [facility]  [address] [facility]* (retail)       [address]       [place], [place]  [address]       Ph: [phone] or [phone]       Fax: [phone]   RxID:   [id number]

## 2010-12-18 NOTE — Letter (Signed)
Summary: [facility] 08-02-2010/08-31-2010  [facility] 08-02-2010/08-31-2010   Imported By: Margie Billet 09/06/2010 10:31:51  _____________________________________________________________________  External Attachment:    Type:   Image     Comment:   External Document

## 2010-12-18 NOTE — Progress Notes (Signed)
Summary: phone/gg  Phone Note Call from Patient   Caller: Patient Summary of Call: Pt called asking if she needed a pneumonia vaccine.  I don't see where she as ever received one. She has a scheduled appointment in December.   Can we wait until then or do you want her to receive the shot sooner? Initial call taken by: Merrie Roof RN,  September 11, 2010 10:46 AM  Follow-up for Phone Call        Patient can wait until December in order to received her Pneumonia vaccine and also her flu shot.

## 2010-12-18 NOTE — Progress Notes (Signed)
Summary: phone/gg  Phone Note Call from Patient   Caller: Patient Summary of Call: Pt called stating she has been sick and SOB with activity. Hx bronchitis x 3 this year.  Pt advised to go to ED or [facility] we have no appointments this week. She has appointment Oct 1st Initial call taken by: Merrie Roof RN,  August 13, 2010 4:46 PM  Follow-up for Phone Call        Agree. Follow-up by: Margarito Liner MD,  August 13, 2010 5:10 PM

## 2010-12-18 NOTE — Progress Notes (Signed)
Summary: refill/ hla  Phone Note Refill Request Message from:  Fax from Pharmacy on August 10, 2010 4:12 PM  Refills Requested: Medication #1:  NEXIUM 40 MG CPDR Take 1 tablet by mouth once a day   Dosage confirmed as above?Dosage Confirmed   Last Refilled: 8/28 last visit 7/8  Initial call taken by: Marin Roberts RN,  August 10, 2010 4:13 PM  Follow-up for Phone Call        Refill approved-nurse to complete Follow-up by: Julaine Fusi  DO,  August 10, 2010 4:24 PM    Prescriptions: NEXIUM 40 MG CPDR (ESOMEPRAZOLE MAGNESIUM) Take 1 tablet by mouth once a day  #31 x 6   Entered by:   Julaine Fusi  DO   Authorized by:   Vassie Loll MD   Signed by:   Julaine Fusi  DO on 08/10/2010   Method used:   Electronically to        [facility]  [address] [id number]* (retail)       [address]       [place], [place]  [address]       Ph: [phone] or [phone]       Fax: [phone]   RxID:   [id number]

## 2010-12-18 NOTE — Progress Notes (Signed)
Summary: refills, appt/ hla  Phone Note Call from Patient   Summary of Call: pt called about refills, pharm contacted, the refills are ready for pickup, pt made aware, also scheduled dec appt Initial call taken by: Marin Roberts RN,  July 16, 2010 4:41 PM

## 2010-12-18 NOTE — Assessment & Plan Note (Signed)
Summary: back pain, wants scan or xray/pcp-madera/hla   Vital Signs:  Patient profile:   77 year old female Height:      66 inches (167.64 cm) Weight:      133.6 pounds (58.68 kg) BMI:     20.91 Temp:     99.1 degrees F (37.28 degrees C) oral Pulse rate:   88 / minute BP sitting:   157 / 76  (right arm) Cuff size:   regular  Vitals Entered By: Theotis Barrio NT II (May 25, 2010 3:47 PM) CC: BACK PAIN (PER PATIENT TO GET X-RAY IF BACK CONTINUES TO HURT-MADERA) Is Patient Diabetic? No Pain Assessment Patient in pain? yes     Location: back Intensity:  10  Have you ever been in a relationship where you felt threatened, hurt or afraid?No   Does patient need assistance? Functional Status Self care Ambulation Normal Comments HERE TO HAVE X-RAY OF BACK DUE TO BACK PAIN   Primary Care Provider:  Vassie Loll MD  CC:  BACK PAIN (PER PATIENT TO GET X-RAY IF BACK CONTINUES TO HURT-MADERA).  History of Present Illness: Christine [name] is a 77 yo women with PMH as described in EMR is here today for a follow up appointment for her back pain. She said that shewas well untill she had flexeril for pain control but the the back came back once she finished her meds. The pain is described as 9/10 at its worse, aggravated by movement and bending, relieved by rest and meds, no tingling, numbness or weakness in her egs, no change in bladder or bowel habits. No hematuria, weight loss or decreased appetite noted.  Her BP is elevated today in the setting of being in acute pain.  No other complaints at this time.  Preventive Screening-Counseling & Management  Alcohol-Tobacco     Smoking Status: never  Caffeine-Diet-Exercise     Does Patient Exercise: no     Type of exercise: ROM     Times/week: 1  Problems Prior to Update: 1)  Back Pain, Acute  (ICD-724.5) 2)  Renal Insufficiency, Acute  (ICD-585.9) 3)  Flank Pain, Left  (ICD-789.09) 4)  Cough  (ICD-786.2) 5)  Upper Respiratory Infection,  Acute, With Bronchitis  (ICD-465.9) 6)  Diabetes Mellitus, Type II  (ICD-250.00) 7)  Hypertension  (ICD-401.9) 8)  Hyperlipidemia  (ICD-272.4) 9)  Migraine Headache  (ICD-346.90) 10)  Hypothyroidism  (ICD-244.9) 11)  Bone Tumor  (ICD-239.2) 12)  Pulmonary Nodule  (ICD-518.89) 13)  Anemia-nos  (ICD-285.9) 14)  Polymyalgia Rheumatica  (ICD-725) 15)  Gerd  (ICD-530.81)  Medications Prior to Update: 1)  Hydrochlorothiazide 25 Mg Tabs (Hydrochlorothiazide) .... Take 1 Tablet By Mouth Once A Day 2)  Imipramine Hcl 50 Mg Tabs (Imipramine Hcl) .... Take 1 Tablet By Mouth Two Times A Day 3)  Synthroid 25 Mcg Tabs (Levothyroxine Sodium) .... Take 1 Tablet By Mouth Once Daily 4)  Glimepiride 1 Mg Tabs (Glimepiride) .... Take 1 Tablet By Mouth Once Daily 5)  Diovan 160 Mg Tabs (Valsartan) .... Take 1 Tablet By Mouth Once A Day 6)  Onetouch Ultrasoft Lancets   Misc (Lancets) .... Use As Directed To Check Your Sugars 7)  Onetouch Ultra Test   Strp (Glucose Blood) .... Use As Directed To Check Your Sugars 8)  Tracer Ii 3 Volt Battery   Misc (Blood Glucose Monitoring Suppl) .... Needs 3 Volt Lithium Battery For One Touch Ultra Meter To Test Her Blood Glucose- Cpt Code A4254 9)  Oscal 500/200  D-3 500-200 Mg-Unit  Tabs (Calcium-Vitamin D) .... Take 1 Tablet By Mouth Three Times A Day 10)  Lipitor 40 Mg Tabs (Atorvastatin Calcium) .... Take 1 Tab By Mouth At Bedtime 11)  Nexium 40 Mg Cpdr (Esomeprazole Magnesium) .... Take 1 Tablet By Mouth Once A Day 12)  Hydromet 5-1.5 Mg/40ml Syrp (Hydrocodone-Homatropine) .... Take 5ml Every 8 Hours As Needed For Cough. 13)  Loratadine 10 Mg Tabs (Loratadine) .... Take 1 Tablet By Mouth Once A Day  Current Medications (verified): 1)  Hydrochlorothiazide 25 Mg Tabs (Hydrochlorothiazide) .... Take 1 Tablet By Mouth Once A Day 2)  Imipramine Hcl 50 Mg Tabs (Imipramine Hcl) .... Take 1 Tablet By Mouth Two Times A Day 3)  Synthroid 25 Mcg Tabs (Levothyroxine Sodium) ....  Take 1 Tablet By Mouth Once Daily 4)  Glimepiride 1 Mg Tabs (Glimepiride) .... Take 1 Tablet By Mouth Once Daily 5)  Diovan 160 Mg Tabs (Valsartan) .... Take 1 Tablet By Mouth Once A Day 6)  Onetouch Ultrasoft Lancets   Misc (Lancets) .... Use As Directed To Check Your Sugars 7)  Onetouch Ultra Test   Strp (Glucose Blood) .... Use As Directed To Check Your Sugars 8)  Tracer Ii 3 Volt Battery   Misc (Blood Glucose Monitoring Suppl) .... Needs 3 Volt Lithium Battery For One Touch Ultra Meter To Test Her Blood Glucose- Cpt Code A4254 9)  Oscal 500/200 D-3 500-200 Mg-Unit  Tabs (Calcium-Vitamin D) .... Take 1 Tablet By Mouth Three Times A Day 10)  Lipitor 40 Mg Tabs (Atorvastatin Calcium) .... Take 1 Tab By Mouth At Bedtime 11)  Nexium 40 Mg Cpdr (Esomeprazole Magnesium) .... Take 1 Tablet By Mouth Once A Day 12)  Cyclobenzaprine Hcl 5 Mg Tabs (Cyclobenzaprine Hcl) .... Take 1 Tablet By Mouth Once A Day At Night. You May Take 1 Pill During Day Time If in Acute Pain.  Allergies (verified): 1)  ! Asa 2)  ! Codeine  Past History:  Past Medical History: Last updated: 04/29/2008 Diabetes mellitus, type II GERD Hypertension Hypothyroidism Anemia-NOS Hyperlipidemia Temporal arteritis-suspected /Polymyalgia rheumatica Rotator cuff syndrome Sinus arrythmia Tinnitus Migraine headaches H. pylori infection Myoclonus, hx of Postmenopausal state Paresthesia, R 2-4th finger tingling Pulmonary scarring and nodules, likely postinflammatory Right clavicular mass, w/up in process 6/09  Past Surgical History: Last updated: 09/04/2006 Hysterectomy - TAH, 1976  Family History: Last updated: Apr 07, 2009 Mother died of colon cancer at age 41. 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 Mother died of colon cancer at age 56. F passed at 40 from emphysema 4 half sisters, 1 with AD, 1 passed with AD, 2 unknown 5 children, 1 son passed from emphysema and PAH; 1 daughter s/p lung transplant for [facility] (died 03/21/09), other daughter and 2 sons are healthy  Social History: Reviewed history from 10/17/2008 and no changes required. 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    Impression & Recommendations:  Problem # 1:  BACK PAIN, ACUTE (ICD-724.5) Assessment Unchanged I discussed the case with Dr Aundria Rud and he agreed that we should evaluate with a Lumbosacral Xray to look for compression fracture, mets/multiple myeloma and osteopenia.  If the xray is normal then we should do complete blood tests including CBC, Cmet, Upep, Spep and urine analysis. We amy also consider doing an MRI of her lumbosacral spine to beter evaluate the causes of back pain. I will continue her on flexeril as needed and have her come back in 2 weeks.  Her updated  medication list for this problem includes:    Cyclobenzaprine Hcl 5 Mg Tabs (Cyclobenzaprine hcl) .Marland Kitchen... Take 1 tablet by mouth once a day at night. you may take 1 pill during day time if in acute pain.  Orders: Diagnostic X-Ray/Fluoroscopy (Diagnostic X-Ray/Flu)  Discussed use of moist heat or ice, modified activities, medications, and stretching/strengthening exercises. Back care instructions given. To be seen in 2 weeks if no improvement; sooner if worsening of symptoms.   Problem # 2:  DIABETES MELLITUS, TYPE II (ICD-250.00) Assessment: Improved Well controlled. Her updated medication list for this problem includes:    Glimepiride 1 Mg Tabs (Glimepiride) .Marland Kitchen... Take 1 tablet by mouth once daily    Diovan 160 Mg Tabs (Valsartan) .Marland Kitchen... Take 1 tablet by mouth once a day  Labs Reviewed: Creat: 1.16 (05/07/2010)     Last Eye Exam: No diabetic retinopathy.  OU  (07/06/2009) Reviewed HgBA1c results: 6.3 (05/07/2010)  6.4 (12/15/2009)  Problem # 3:  HYPERTENSION (ICD-401.9) Assessment: Comment Only No new meds added today in view of increased BP in the setting of acute pain. BP today: 157/76 Prior BP: 135/71 (05/07/2010)  Labs Reviewed: K+: 4.4 (05/07/2010) Creat: : 1.16 (05/07/2010)   Chol: 150 (05/07/2010)   HDL: 43 (05/07/2010)   LDL: 74 (05/07/2010)   TG: 165 (05/07/2010)  Her updated medication list for this problem includes:    Hydrochlorothiazide 25 Mg Tabs (Hydrochlorothiazide) .Marland Kitchen... Take 1 tablet by mouth once a day    Diovan 160 Mg Tabs (Valsartan) .Marland Kitchen... Take 1 tablet by mouth once a day  Complete Medication List: 1)  Hydrochlorothiazide 25 Mg Tabs (Hydrochlorothiazide) .... Take 1 tablet by mouth once a day 2)  Imipramine Hcl 50 Mg Tabs (Imipramine hcl) .... Take 1 tablet by mouth two times a day 3)  Synthroid 25 Mcg Tabs (Levothyroxine sodium) .... Take 1 tablet by mouth once daily 4)  Glimepiride 1 Mg Tabs (Glimepiride) .... Take 1 tablet by mouth once daily 5)   Diovan 160 Mg Tabs (Valsartan) .... Take 1 tablet by mouth once a day 6)  Onetouch Ultrasoft Lancets Misc (Lancets) .... Use as directed to check your sugars 7)  Onetouch Ultra Test Strp (Glucose blood) .... Use as directed to check your sugars 8)  Tracer Ii 3 Volt Battery Misc (Blood glucose monitoring suppl) .... Needs 3 volt lithium  battery for one touch ultra meter to test her blood glucose- cpt code a4254 9)  Oscal 500/200 D-3 500-200 Mg-unit Tabs (Calcium-vitamin d) .... Take 1 tablet by mouth three times a day 10)  Lipitor 40 Mg Tabs (Atorvastatin calcium) .... Take 1 tab by mouth at bedtime 11)  Nexium 40 Mg Cpdr (Esomeprazole magnesium) .... Take 1 tablet by mouth once a day 12)  Cyclobenzaprine Hcl 5 Mg Tabs (Cyclobenzaprine hcl) .... Take 1 tablet by mouth once a day at night. you may take 1 pill during day time if in acute pain.  Patient Instructions: 1)  Please take the meds i prescribed as discussed and continue to do usuall daily activities.  2)  I will call you with the results of your X ray. 3)  Please schedule an appointment in 2 weeks if back pain is not better. 4)  Check your Blood Pressure regularly. If it is above: you should make an appointment. 5)  Limit your Sodium (Salt). Prescriptions: CYCLOBENZAPRINE HCL 5 MG TABS (CYCLOBENZAPRINE HCL) Take 1 tablet by mouth once a day at night. you may take 1 pill during day time if in acute pain.  #20 x 0   Entered and Authorized by:   Lars Mage MD   Signed by:   Lars Mage MD on 05/25/2010   Method used:   Print then Give to Patient   RxID:   [id number]    Prevention & Chronic Care Immunizations   Influenza vaccine: Fluvax MCR  (09/18/2009)   Influenza vaccine deferral: Deferred  (05/25/2010)   Influenza vaccine due: 07/19/2010    Tetanus booster: 09/18/2009: Tdap   Td booster deferral: Deferred  (05/07/2010)   Tetanus booster due: 09/19/2019    Pneumococcal vaccine: Not documented   Pneumococcal vaccine  deferral: Deferred  (05/25/2010)    H. zoster vaccine: Not documented   H. zoster vaccine deferral: Refused  (05/25/2010)  Colorectal Screening   Hemoccult: Not documented   Hemoccult action/deferral: Deferred  (05/16/2009)   Hemoccult due: 05/16/2010    Colonoscopy: Location:  [facility].    (09/28/2008)   Colonoscopy action/deferral: Deferred  (05/16/2009)   Colonoscopy due: 09/28/2018  Other Screening   Pap smear: Not documented   Pap smear action/deferral: Not indicated S/P hysterectomy  (05/16/2009)    Mammogram: ASSESSMENT: Negative - BI-RADS 1^MM DIGITAL SCREENING  (07/06/2009)    DXA bone density scan: Not documented   DXA bone density action/deferral: Deferred  (05/25/2010)   Smoking status: never  (05/25/2010)  Diabetes Mellitus   HgbA1C: 6.3  (05/07/2010)   HgbA1C action/deferral: Ordered  (05/07/2010)   Hemoglobin A1C due: 08/16/2009    Eye exam: No diabetic retinopathy.  OU   (07/06/2009)   Eye exam due: 07/2010    Foot exam: yes  (05/07/2010)   Foot exam action/deferral: Do today   High risk foot: No  (03/25/2007)   Foot care education: Done  (03/16/2009)   Foot exam due: 05/16/2010    Urine microalbumin/creatinine ratio: 4.8  (05/07/2010)   Urine microalbumin action/deferral: Ordered   Urine microalbumin/cr due: 05/16/2010    Diabetes flowsheet reviewed?: Yes   Progress toward A1C goal: At goal  Lipids   Total Cholesterol: 150  (05/07/2010)   Lipid panel action/deferral: Lipid Panel ordered   LDL: 74  (05/07/2010)   LDL Direct: Not documented   HDL: 43  (05/07/2010)   Triglycerides: 165  (05/07/2010)    SGOT (AST): 16  (05/07/2010)   BMP action: Ordered  SGPT (ALT): 14  (05/07/2010)   Alkaline phosphatase: 98  (05/07/2010)   Total bilirubin: 0.3  (05/07/2010)    Lipid flowsheet reviewed?: Yes   Progress toward LDL goal: At goal  Hypertension   Last Blood Pressure: 157 / 76  (05/25/2010)   Serum creatinine: 1.16   (05/07/2010)   BMP action: Ordered   Serum potassium 4.4  (05/07/2010)    Hypertension flowsheet reviewed?: Yes   Progress toward BP goal: At goal  Self-Management Support :   Personal Goals (by the next clinic visit) :     Personal A1C goal: 6  (09/18/2009)     Personal blood pressure goal: 130/80  (09/18/2009)     Personal LDL goal: 70  (09/18/2009)    Patient will work on the following items until the next clinic visit to reach self-care goals:     Medications and monitoring: take my medicines every day, check my blood sugar, bring all of my medications to every visit, examine my feet every day  (05/25/2010)     Eating: drink diet soda or water instead of juice or soda, eat more vegetables, use fresh or frozen vegetables, eat foods that are low in salt, eat baked foods instead of fried foods, eat fruit for snacks and desserts, limit or avoid alcohol  (05/25/2010)     Activity: take a 30 minute walk every day, park at the far end of the parking lot  (05/07/2010)     Home glucose monitoring frequency: 1 time daily  (09/18/2009)    Diabetes self-management support: Written self-care plan  (05/25/2010)   Diabetes care plan printed    Hypertension self-management support: Written self-care plan  (05/25/2010)   Hypertension self-care plan printed.    Lipid self-management support: Written self-care plan  (05/25/2010)   Lipid self-care plan printed.

## 2010-12-18 NOTE — Progress Notes (Signed)
Summary: refill/gg  Phone Note Refill Request  on Mar 27, 2010 4:38 PM  Refills Requested: Medication #1:  ONETOUCH ULTRASOFT LANCETS   MISC Use as directed to check your sugars   Last Refilled: 12/21/2009  Medication #2:  ONETOUCH ULTRA TEST   STRP Use as directed to check your sugars  Method Requested: Electronic Initial call taken by: Merrie Roof RN,  Mar 27, 2010 4:38 PM  Follow-up for Phone Call        Refill approved-nurse to complete    Prescriptions: ONETOUCH ULTRA TEST   STRP (GLUCOSE BLOOD) Use as directed to check your sugars  #1 box x 11   Entered and Authorized by:   Vassie Loll MD   Signed by:   Vassie Loll MD on 03/28/2010   Method used:   Electronically to        [facility]  [address] [id number]* (retail)       [address]       [place], [place]  [address]       Ph: [phone] or [phone]       Fax: [phone]   RxID:   [id number] [facility] ULTRASOFT LANCETS   MISC (LANCETS) Use as directed to check your sugars  #1 box x 11   Entered and Authorized by:   Vassie Loll MD   Signed by:   Vassie Loll MD on 03/28/2010   Method used:   Electronically to        [facility] [address] [id number]* (retail)       [address]       [place], [place]  [address]       Ph: [phone] or [phone]       Fax: [phone]   RxID:   [id number]

## 2010-12-18 NOTE — Progress Notes (Signed)
Summary: Refill/gh  Phone Note Refill Request Message from:  Fax from Pharmacy on January 15, 2010 1:56 PM  Refills Requested: Medication #1:  NEXIUM 40 MG CPDR Take 1 tablet by mouth once a day   Last Refilled: 12/19/2009  Method Requested: Electronic Initial call taken by: Angelina Ok RN,  January 15, 2010 1:56 PM  Follow-up for Phone Call        Refill approved-nurse to complete    Prescriptions: NEXIUM 40 MG CPDR (ESOMEPRAZOLE MAGNESIUM) Take 1 tablet by mouth once a day  #31 x 6   Entered and Authorized by:   Vassie Loll MD   Signed by:   Vassie Loll MD on 01/15/2010   Method used:   Electronically to        [facility]  [address] [id number]* (retail)       [address]       [place], [place]  [address]       Ph: [phone] or [phone]       Fax: [phone]   RxID:   [id number]

## 2010-12-20 NOTE — Assessment & Plan Note (Signed)
Summary: RA/NEEDS F/U VISIT/CH   Vital Signs:  Patient profile:   77 year old female Height:      66 inches Weight:      138.2 pounds (62.82 kg) Temp:     98.6 degrees F oral BP sitting:   123 / 63  (left arm) Cuff size:   regular  Vitals Entered By: Cynda Familia Duncan Dull) (November 28, 2010 2:59 PM)  Does patient need assistance? Functional Status Self care Ambulation Normal   Diabetic Foot Exam Foot Inspection Is there a claw toe deformity?              No Is there elevated skin temperature?            No Is there limited ankle dorsiflexion?            No Is there foot or ankle muscle weakness?            No  Diabetic Foot Care Education Patient educated on appropriate care of diabetic feet.   High Risk Feet? No   Primary Care Provider:  Bethel Born MD   History of Present Illness: 77 y/o w with h/o DM, HTN, HLD, migraine headaches, COPD, hypothyroidism comes for follow up seen 1 month ago when she was started on advair for COPD and neuronitn for atypical migraine headache and asked to come back in 1 month  no new complaints today has been doing well with advair as far as her COPD is doing.   she has started taking neuronitn but does not know what she takes it for. she has also been feeling very drowsy taking it.   Current Medications (verified): 1)  Hydrochlorothiazide 25 Mg Tabs (Hydrochlorothiazide) .... Take 1 Tablet By Mouth Once A Day 2)  Imipramine Hcl 50 Mg Tabs (Imipramine Hcl) .... Take 1 Tablet By Mouth Two Times A Day 3)  Synthroid 25 Mcg Tabs (Levothyroxine Sodium) .... Take 1 Tablet By Mouth Once Daily 4)  Glimepiride 1 Mg Tabs (Glimepiride) .... Take 1 Tablet By Mouth Once Daily 5)  Diovan 160 Mg Tabs (Valsartan) .... Take 1 Tablet By Mouth Once A Day 6)  Onetouch Ultrasoft Lancets   Misc (Lancets) .... Use As Directed To Check Your Sugars 7)  Onetouch Ultra Test   Strp (Glucose Blood) .... Use As Directed To Check Your Sugars 8)  Tracer Ii 3  Volt Battery   Misc (Blood Glucose Monitoring Suppl) .... Needs 3 Volt Lithium Battery For One Touch Ultra Meter To Test Her Blood Glucose- Cpt Code A4254 9)  Lipitor 40 Mg Tabs (Atorvastatin Calcium) .... Take 1 Tab By Mouth At Bedtime 10)  Nexium 40 Mg Cpdr (Esomeprazole Magnesium) .... Take 1 Tablet By Mouth Once A Day 11)  Neurontin 300 Mg Caps (Gabapentin) .... Take 1 Capsule By Mouth Three Times A Day 12)  Proventil Hfa 108 (90 Base) Mcg/act Aers (Albuterol Sulfate) .... 2 Puffs Every 4-6 Hours As Needed For Shortness of Breath 13)  Advair Diskus 250-50 Mcg/dose Aepb (Fluticasone-Salmeterol) .... Inhale 1puff By Mouth Two Times A Day  Allergies (verified): 1)  ! Asa 2)  ! Codeine  Review of Systems  The patient denies anorexia, fever, weight loss, weight gain, vision loss, decreased hearing, hoarseness, chest pain, syncope, dyspnea on exertion, peripheral edema, prolonged cough, headaches, hemoptysis, abdominal pain, melena, hematochezia, severe indigestion/heartburn, hematuria, incontinence, genital sores, muscle weakness, suspicious skin lesions, transient blindness, difficulty walking, depression, unusual weight change, abnormal bleeding, enlarged lymph nodes, angioedema, breast masses,  and testicular masses.    Physical Exam  General:  Gen: VS reveiwed, Alert, well developed, nodistress ENT: mucous membranes pink & moist. No abnormal finds in ear and nose. CVC:S1 S2 , no murmurs, no abnormal heart sounds. Lungs: Clear to auscultation B/L. No wheezes, crackles or other abnormal sounds Abdomen: soft, non distended, no tender. Normal Bowel sounds EXT: no pitting edema, no engorged veins, Pulsations normal  Neuro:alert, oriented *3, cranial nerved 2-12 intact, strenght normal in all  extremities, senstations normal to light touch.      Impression & Recommendations:  Problem # 1:  CHRONIC OBSTRUCTIVE PULMONARY DISEASE, MILD (ICD-496) well controlled  Her updated medication list  for this problem includes:    Proventil Hfa 108 (90 Base) Mcg/act Aers (Albuterol sulfate) .Marland Kitchen... 2 puffs every 4-6 hours as needed for shortness of breath    Advair Diskus 250-50 Mcg/dose Aepb (Fluticasone-salmeterol) ..... Inhale 1puff by mouth two times a day  Problem # 2:  DIABETES MELLITUS, TYPE II (ICD-250.00)  well controlled.  Her updated medication list for this problem includes:    Glimepiride 1 Mg Tabs (Glimepiride) .Marland Kitchen... Take 1 tablet by mouth once daily    Diovan 160 Mg Tabs (Valsartan) .Marland Kitchen... Take 1 tablet by mouth once a day  Labs Reviewed: Creat: 1.11 (11/01/2010)     Last Eye Exam: No diabetic retinopathy.  OU  (07/06/2009) Reviewed HgBA1c results: 6.8 (08/20/2010)  6.3 (05/07/2010)  Orders: T-Hgb A1C (in-house) ([id number]) T- Capillary Blood Glucose (11914)  Problem # 3:  HYPERTENSION (ICD-401.9) at goal  Her updated medication list for this problem includes:    Hydrochlorothiazide 25 Mg Tabs (Hydrochlorothiazide) .Marland Kitchen... Take 1 tablet by mouth once a day    Diovan 160 Mg Tabs (Valsartan) .Marland Kitchen... Take 1 tablet by mouth once a day  BP today: 123/63 Prior BP: 131/75 (10/31/2010)  Labs Reviewed: K+: 4.0 (11/01/2010) Creat: : 1.11 (11/01/2010)   Chol: 145 (11/01/2010)   HDL: 39 (11/01/2010)   LDL: 66 (11/01/2010)   TG: 198 (11/01/2010)  Problem # 4:  HYPERLIPIDEMIA (ICD-272.4) at goal  Her updated medication list for this problem includes:    Lipitor 40 Mg Tabs (Atorvastatin calcium) .Marland Kitchen... Take 1 tab by mouth at bedtime  Labs Reviewed: SGOT: 24 (11/01/2010)   SGPT: 18 (11/01/2010)   HDL:39 (11/01/2010), 43 (05/07/2010)  LDL:66 (11/01/2010), 74 (05/07/2010)  Chol:145 (11/01/2010), 150 (05/07/2010)  Trig:198 (11/01/2010), 165 (05/07/2010)  Problem # 5:  HYPOTHYROIDISM (ICD-244.9)  will check TSH  Her updated medication list for this problem includes:    Synthroid 25 Mcg Tabs (Levothyroxine sodium) .Marland Kitchen... Take 1 tablet by mouth once daily  Labs Reviewed: TSH:  3.410 (05/07/2010)    HgBA1c: 6.8 (08/20/2010) Chol: 145 (11/01/2010)   HDL: 39 (11/01/2010)   LDL: 66 (11/01/2010)   TG: 198 (11/01/2010)  Orders: T-TSH ([id number])  Complete Medication List: 1)  Hydrochlorothiazide 25 Mg Tabs (Hydrochlorothiazide) .... Take 1 tablet by mouth once a day 2)  Imipramine Hcl 50 Mg Tabs (Imipramine hcl) .... Take 1 tablet by mouth two times a day 3)  Synthroid 25 Mcg Tabs (Levothyroxine sodium) .... Take 1 tablet by mouth once daily 4)  Glimepiride 1 Mg Tabs (Glimepiride) .... Take 1 tablet by mouth once daily 5)  Diovan 160 Mg Tabs (Valsartan) .... Take 1 tablet by mouth once a day 6)  Onetouch Ultrasoft Lancets Misc (Lancets) .... Use as directed to check your sugars 7)  Onetouch Ultra Test Strp (Glucose blood) .... Use as directed to check  your sugars 8)  Tracer Ii 3 Volt Battery Misc (Blood glucose monitoring suppl) .... Needs 3 volt lithium battery for one touch ultra meter to test her blood glucose- cpt code a4254 9)  Lipitor 40 Mg Tabs (Atorvastatin calcium) .... Take 1 tab by mouth at bedtime 10)  Nexium 40 Mg Cpdr (Esomeprazole magnesium) .... Take 1 tablet by mouth once a day 11)  Neurontin 300 Mg Caps (Gabapentin) .... Take 1 capsule by mouth three times a day 12)  Proventil Hfa 108 (90 Base) Mcg/act Aers (Albuterol sulfate) .... 2 puffs every 4-6 hours as needed for shortness of breath 13)  Advair Diskus 250-50 Mcg/dose Aepb (Fluticasone-salmeterol) .... Inhale 1puff by mouth two times a day  Patient Instructions: 1)  Please schedule a follow-up appointment in 3 months.   Orders Added: 1)  T-Hgb A1C (in-house) [[id number]] 2)  T-TSH [[id number]] 3)  T- Capillary Blood Glucose [82948]   Pneumonia Vaccine (to be given today)   Pneumonia Vaccine (to be given today) Process Orders Check Orders Results:     [facility]: Check successful Tests Sent for requisitioning (November 28, 2010 4:17 PM):     11/28/2010: [facility] -- T-TSH [phone] (signed)     Prevention & Chronic Care Immunizations   Influenza vaccine: Fluvax MCR  (08/20/2010)   Influenza vaccine deferral: Deferred  (05/25/2010)   Influenza vaccine due: 07/19/2010    Tetanus booster: 09/18/2009: Tdap   Td booster deferral: Deferred  (05/07/2010)   Tetanus booster due: 09/19/2019    Pneumococcal vaccine: Not documented   Pneumococcal vaccine deferral: Deferred  (05/25/2010)    H. zoster vaccine: Not documented   H. zoster vaccine deferral: Refused  (05/25/2010)  Colorectal Screening   Hemoccult: Not documented   Hemoccult action/deferral: Deferred  (05/16/2009)   Hemoccult due: 05/16/2010    Colonoscopy: Location:  [facility].    (09/28/2008)   Colonoscopy action/deferral: Deferred  (05/16/2009)   Colonoscopy due: 09/28/2018  Other Screening   Pap smear: Not documented   Pap smear action/deferral: Not indicated S/P hysterectomy  (05/16/2009)    Mammogram: ASSESSMENT: Negative - BI-RADS 1^MM DIGITAL SCREENING  (07/06/2009)   Mammogram action/deferral: Ordered  (11/28/2010)    DXA bone density scan: Not documented   DXA bone density action/deferral: Refused  (11/28/2010)   Smoking status: never  (08/31/2010)  Diabetes Mellitus   HgbA1C: 6.7  (11/28/2010)   HgbA1C action/deferral: Ordered  (11/28/2010)   Hemoglobin A1C due: 08/16/2009    Eye exam: No diabetic retinopathy.  OU   (07/06/2009)   Eye exam due: 07/2010    Foot exam: yes  (05/07/2010)   Foot exam action/deferral: Do today   High risk foot: No  (11/28/2010)   Foot care education: Done  (11/28/2010)   Foot exam due: 05/16/2010    Urine microalbumin/creatinine ratio: 4.8  (05/07/2010)   Urine microalbumin action/deferral: Ordered   Urine microalbumin/cr due: 05/16/2010    Diabetes flowsheet reviewed?: Yes   Progress toward A1C goal: Unchanged  Lipids   Total Cholesterol: 145  (11/01/2010)   Lipid panel  action/deferral: Lipid Panel ordered   LDL: 66  (11/01/2010)   LDL Direct: Not documented   HDL: 39  (11/01/2010)   Triglycerides: 198  (11/01/2010)    SGOT (AST): 24  (11/01/2010)   BMP action: Ordered   SGPT (ALT): 18  (11/01/2010)   Alkaline phosphatase: 133  (11/01/2010)   Total bilirubin: 0.5  (11/01/2010)    Lipid flowsheet reviewed?: Yes  Progress toward LDL goal: At goal  Hypertension   Last Blood Pressure: 123 / 63  (11/28/2010)   Serum creatinine: 1.11  (11/01/2010)   BMP action: Ordered   Serum potassium 4.0  (11/01/2010)    Hypertension flowsheet reviewed?: Yes   Progress toward BP goal: At goal  Self-Management Support :   Personal Goals (by the next clinic visit) :     Personal A1C goal: 6  (09/18/2009)     Personal blood pressure goal: 130/80  (09/18/2009)     Personal LDL goal: 70  (09/18/2009)     Home glucose monitoring frequency: 1 time daily  (09/18/2009)    Diabetes self-management support: Written self-care plan  (10/31/2010)    Hypertension self-management support: Written self-care plan  (10/31/2010)    Lipid self-management support: Written self-care plan  (10/31/2010)    Nursing Instructions: Give Pneumovax today Schedule screening mammogram (see order) HgbA1C today (see order) Diabetic foot exam today   Process Orders Check Orders Results:     [facility]: Check successful Tests Sent for requisitioning (November 28, 2010 4:17 PM):     11/28/2010: [facility] -- T-TSH [phone] (signed)     Laboratory Results   Blood Tests   Date/Time Received: November 28, 2010 3:56 PM  Date/Time Reported: Burke Keels  November 28, 2010 3:56 PM   HGBA1C: 6.7%   (Normal Range: Non-Diabetic - 3-6%   Control Diabetic - 6-8%)

## 2010-12-20 NOTE — Progress Notes (Signed)
Summary: refill/ hla  Phone Note Refill Request Message from:  Patient on November 08, 2010 11:24 AM  Refills Requested: Medication #1:  ADVAIR DISKUS 250-50 MCG/DOSE AEPB Inhale 1puff by mouth two times a day.   Dosage confirmed as above?Dosage Confirmed Initial call taken by: Marin Roberts RN,  November 08, 2010 11:25 AM  Follow-up for Phone Call        Refilled electronically.  Follow-up by: Margarito Liner MD,  November 08, 2010 3:41 PM    Prescriptions: ADVAIR DISKUS 250-50 MCG/DOSE AEPB (FLUTICASONE-SALMETEROL) Inhale 1puff by mouth two times a day  #1 x 1   Entered and Authorized by:   Margarito Liner MD   Signed by:   Margarito Liner MD on 11/08/2010   Method used:   Electronically to        [id number] (retail)       [address]       [place], [place]  [address]       Ph: [phone] or [phone]       Fax: [phone]   RxID:   [id number]

## 2010-12-20 NOTE — Assessment & Plan Note (Signed)
Summary: checkup/pcp-madera/hla   Vital Signs:  Patient profile:   77 year old female Height:      66 inches (167.64 cm) Weight:      135.4 pounds (61.55 kg) BMI:     21.93 O2 Sat:      100 % on Room air Temp:     98.6 degrees F (37.00 degrees C) oral Pulse rate:   87 / minute BP sitting:   131 / 75  (left arm) Cuff size:   regular  Vitals Entered By: Cynda Familia Duncan Dull) (October 31, 2010 3:05 PM)  O2 Flow:  Room air CC: problems breathing, med refill Is Patient Diabetic? Yes Did you bring your meter with you today? Yes Pain Assessment Patient in pain? no      Nutritional Status BMI of 19 -24 = normal CBG Result 99  Have you ever been in a relationship where you felt threatened, hurt or afraid?No   Does patient need assistance? Functional Status Self care Ambulation Normal   Primary Care Provider:  Vassie Loll MD  CC:  problems breathing and med refill.  History of Present Illness: 76 y/o female with pmh as described in the EMR, who comes to the clinic for followup and medication refill. Patient described that after been started on the inhalers she has had mild relieved to her SOB symptoms, but still notice some difficulty breathing and sometimes the inhalers do not add any relieved.  she denies any CP, palpitations, sweating, n/v, abdominal pain or any other complaints with her episodes of SOB.  She endorses some ligthning type of pain in her nech and also in her shoulders, sometimes radiated to her arms and fingers but is not associated with weakness.  Patient is compliant with her medications.  Problems Prior to Update: 1)  Heart Murmur, Systolic  (ICD-785.2) 2)  Chronic Obstructive Pulmonary Disease, Mild  (ICD-496) 3)  Back Pain, Acute  (ICD-724.5) 4)  Renal Insufficiency, Acute  (ICD-585.9) 5)  Flank Pain, Left  (ICD-789.09) 6)  Cough  (ICD-786.2) 7)  Upper Respiratory Infection, Acute, With Bronchitis  (ICD-465.9) 8)  Diabetes Mellitus, Type II   (ICD-250.00) 9)  Hypertension  (ICD-401.9) 10)  Hyperlipidemia  (ICD-272.4) 11)  Migraine Headache  (ICD-346.90) 12)  Hypothyroidism  (ICD-244.9) 13)  Bone Tumor  (ICD-239.2) 14)  Pulmonary Nodule  (ICD-518.89) 15)  Anemia-nos  (ICD-285.9) 16)  Polymyalgia Rheumatica  (ICD-725) 17)  Gerd  (ICD-530.81)  Current Problems (verified): 1)  Chronic Obstructive Pulmonary Disease, Mild  (ICD-496) 2)  Back Pain, Acute  (ICD-724.5) 3)  Renal Insufficiency, Acute  (ICD-585.9) 4)  Flank Pain, Left  (ICD-789.09) 5)  Cough  (ICD-786.2) 6)  Upper Respiratory Infection, Acute, With Bronchitis  (ICD-465.9) 7)  Diabetes Mellitus, Type II  (ICD-250.00) 8)  Hypertension  (ICD-401.9) 9)  Hyperlipidemia  (ICD-272.4) 10)  Migraine Headache  (ICD-346.90) 11)  Hypothyroidism  (ICD-244.9) 12)  Bone Tumor  (ICD-239.2) 13)  Pulmonary Nodule  (ICD-518.89) 14)  Anemia-nos  (ICD-285.9) 15)  Polymyalgia Rheumatica  (ICD-725) 16)  Gerd  (ICD-530.81)  Medications Prior to Update: 1)  Hydrochlorothiazide 25 Mg Tabs (Hydrochlorothiazide) .... Take 1 Tablet By Mouth Once A Day 2)  Imipramine Hcl 50 Mg Tabs (Imipramine Hcl) .... Take 1 Tablet By Mouth Two Times A Day 3)  Synthroid 25 Mcg Tabs (Levothyroxine Sodium) .... Take 1 Tablet By Mouth Once Daily 4)  Glimepiride 1 Mg Tabs (Glimepiride) .... Take 1 Tablet By Mouth Once Daily 5)  Diovan 160 Mg Tabs (  Valsartan) .... Take 1 Tablet By Mouth Once A Day 6)  Onetouch Ultrasoft Lancets   Misc (Lancets) .... Use As Directed To Check Your Sugars 7)  Onetouch Ultra Test   Strp (Glucose Blood) .... Use As Directed To Check Your Sugars 8)  Tracer Ii 3 Volt Battery   Misc (Blood Glucose Monitoring Suppl) .... Needs 3 Volt Lithium Battery For One Touch Ultra Meter To Test Her Blood Glucose- Cpt Code A4254 9)  Oscal 500/200 D-3 500-200 Mg-Unit  Tabs (Calcium-Vitamin D) .... Take 1 Tablet By Mouth Three Times A Day 10)  Lipitor 40 Mg Tabs (Atorvastatin Calcium) .... Take 1  Tab By Mouth At Bedtime 11)  Nexium 40 Mg Cpdr (Esomeprazole Magnesium) .... Take 1 Tablet By Mouth Once A Day 12)  Cyclobenzaprine Hcl 5 Mg Tabs (Cyclobenzaprine Hcl) .... Take 1 Tablet By Mouth Once A Day At Night. You May Take 1 Pill During Day Time If in Acute Pain. 13)  Proventil Hfa 108 (90 Base) Mcg/act Aers (Albuterol Sulfate) .... 2 Puffs Every 4-6 Hours As Needed For Shortness of Breath  Current Medications (verified): 1)  Hydrochlorothiazide 25 Mg Tabs (Hydrochlorothiazide) .... Take 1 Tablet By Mouth Once A Day 2)  Imipramine Hcl 50 Mg Tabs (Imipramine Hcl) .... Take 1 Tablet By Mouth Two Times A Day 3)  Synthroid 25 Mcg Tabs (Levothyroxine Sodium) .... Take 1 Tablet By Mouth Once Daily 4)  Glimepiride 1 Mg Tabs (Glimepiride) .... Take 1 Tablet By Mouth Once Daily 5)  Diovan 160 Mg Tabs (Valsartan) .... Take 1 Tablet By Mouth Once A Day 6)  Onetouch Ultrasoft Lancets   Misc (Lancets) .... Use As Directed To Check Your Sugars 7)  Onetouch Ultra Test   Strp (Glucose Blood) .... Use As Directed To Check Your Sugars 8)  Tracer Ii 3 Volt Battery   Misc (Blood Glucose Monitoring Suppl) .... Needs 3 Volt Lithium Battery For One Touch Ultra Meter To Test Her Blood Glucose- Cpt Code A4254 9)  Oscal 500/200 D-3 500-200 Mg-Unit  Tabs (Calcium-Vitamin D) .... Take 1 Tablet By Mouth Three Times A Day 10)  Lipitor 40 Mg Tabs (Atorvastatin Calcium) .... Take 1 Tab By Mouth At Bedtime 11)  Nexium 40 Mg Cpdr (Esomeprazole Magnesium) .... Take 1 Tablet By Mouth Once A Day 12)  Cyclobenzaprine Hcl 5 Mg Tabs (Cyclobenzaprine Hcl) .... Take 1 Tablet By Mouth Once A Day At Night. You May Take 1 Pill During Day Time If in Acute Pain. 13)  Proventil Hfa 108 (90 Base) Mcg/act Aers (Albuterol Sulfate) .... 2 Puffs Every 4-6 Hours As Needed For Shortness of Breath  Allergies (verified): 1)  ! Asa 2)  ! Codeine  Past History:  Past Medical History: Last updated: 04/29/2008 Diabetes mellitus, type  II GERD Hypertension Hypothyroidism Anemia-NOS Hyperlipidemia Temporal arteritis-suspected /Polymyalgia rheumatica Rotator cuff syndrome Sinus arrythmia Tinnitus Migraine headaches H. pylori infection Myoclonus, hx of Postmenopausal state Paresthesia, R 2-4th finger tingling Pulmonary scarring and nodules, likely postinflammatory Right clavicular mass, w/up in process 6/09  Past Surgical History: Last updated: 09/04/2006 Hysterectomy - TAH, 1976  Family History: Last updated: 04/08/2009 Mother died of colon cancer at age 69. 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  Physical Exam  General:  alert, well-developed, appropriate dress, cooperative to examination, and good hygiene.   Lungs:  normal respiratory effort, no intercostal retractions, and no accessory muscle use.  Mild scaterred end expiratory wheezing and mild ronchi. Heart:  normal rate and regular rhythm, SEM I/VI at LSB.   Abdomen:  soft, non-tender, and normal bowel sounds.   Extremities:  no edema Neurologic:  alert & oriented X3.     Impression & Recommendations:  Problem # 1:  HEART MURMUR, SYSTOLIC (ICD-785.2) Patient with systolic murmur and relative new SOB. We got a 2-D echo which was essentially normal, with just trivial non-significant pulmonic valve regurgitation and dyastolic grade 1 heart failure. Also with mild elevated PA pressure which could be secondary to her mild  COPD. Will continue treatment for her her COPD with inhalers (combination of short acting as a rescue one and long acting for maintenance).  Orders: 2 D Echo (2 D Echo)  Problem # 2:  CHRONIC OBSTRUCTIVE PULMONARY DISEASE, MILD (ICD-496) No having full relieved with the as needed use of albuterol. Will add advair as a maintenance and will continue with as needed albuterol for rescue. Patient recieved instructions and explanations of combine therapy.  Her updated medication list for this problem includes:    Proventil Hfa 108 (90 Base) Mcg/act Aers (Albuterol sulfate) .Marland Kitchen... 2 puffs every 4-6 hours as needed for shortness of breath    Advair Diskus 250-50 Mcg/dose Aepb (Fluticasone-salmeterol) ..... Inhale 1puff by mouth two times a day  Problem # 3:  DIABETES MELLITUS, TYPE II (ICD-250.00) Excellent control. Will continue current regimen.  Her updated medication list for this problem includes:    Glimepiride 1 Mg Tabs (Glimepiride) .Marland Kitchen... Take 1 tablet by mouth once daily    Diovan 160 Mg Tabs (Valsartan) .Marland Kitchen... Take 1 tablet by mouth once a day  Orders: T- Capillary Blood Glucose ([address])  Labs Reviewed: Creat: 1.16 (05/07/2010)     Last Eye Exam: No diabetic retinopathy.  OU  (07/06/2009) Reviewed HgBA1c results: 6.8 (08/20/2010)  6.3 (05/07/2010)  Problem # 4:  HYPERTENSION (ICD-401.9) Essentially at goal; will continue same medication regimen for now. Patient advised to follow a low sodium diet. Will check kidney function and electrolytes status.  Her updated medication list for this problem includes:    Hydrochlorothiazide 25 Mg Tabs (Hydrochlorothiazide) .Marland Kitchen... Take 1 tablet by mouth once a day    Diovan 160 Mg Tabs (Valsartan) .Marland Kitchen... Take 1 tablet by mouth once a day  BP today: 131/75 Prior BP: 126/66 (08/31/2010)  Labs Reviewed: K+: 4.4 (05/07/2010) Creat: : 1.16 (05/07/2010)   Chol: 150 (05/07/2010)   HDL: 43 (05/07/2010)   LDL: 74 (05/07/2010)   TG: 165  (05/07/2010)  Problem # 5:  HYPERLIPIDEMIA (ICD-272.4) At goal. LDL 66. Will continue current statins dose. LFT's WNL. Patient advised to continue low fat diet.  Her updated medication list for this problem includes:    Lipitor 40 Mg Tabs (Atorvastatin calcium) .Marland Kitchen... Take 1 tab by mouth at bedtime  Orders: T-Lipid Profile [phone])  Labs Reviewed: SGOT: 16 (05/07/2010)   SGPT: 14 (05/07/2010)   HDL:43 (05/07/2010), 42 (12/15/2009)  LDL:74 (05/07/2010), 86 (12/15/2009)  Chol:150 (05/07/2010), 150 (12/15/2009)  Trig:165 (  05/07/2010), 111 (12/15/2009)  Problem # 6:  MIGRAINE HEADACHE (ICD-346.90) Patient with recent posterios base of her head, neck and shoulder pain; symptoms could be secondary to atypical migraine vs neuropathy (as a diabetic and hypothyroid patient). Will give a trial of gabapentin and follow her symptoms.  Problem # 7:  HYPOTHYROIDISM (ICD-244.9) TSH stable and patient w/o any symptoms at this point. Last TSH WNL will repeat during next visit, for now will continue synthroid at the same dose.  Her updated medication list for this problem includes:    Synthroid 25 Mcg Tabs (Levothyroxine sodium) .Marland Kitchen... Take 1 tablet by mouth once daily  Labs Reviewed: TSH: 3.410 (05/07/2010)    HgBA1c: 6.8 (08/20/2010) Chol: 150 (05/07/2010)   HDL: 43 (05/07/2010)   LDL: 74 (05/07/2010)   TG: 165 (05/07/2010)  Complete Medication List: 1)  Hydrochlorothiazide 25 Mg Tabs (Hydrochlorothiazide) .... Take 1 tablet by mouth once a day 2)  Imipramine Hcl 50 Mg Tabs (Imipramine hcl) .... Take 1 tablet by mouth two times a day 3)  Synthroid 25 Mcg Tabs (Levothyroxine sodium) .... Take 1 tablet by mouth once daily 4)  Glimepiride 1 Mg Tabs (Glimepiride) .... Take 1 tablet by mouth once daily 5)  Diovan 160 Mg Tabs (Valsartan) .... Take 1 tablet by mouth once a day 6)  Onetouch Ultrasoft Lancets Misc (Lancets) .... Use as directed to check your sugars 7)  Onetouch Ultra Test Strp (Glucose  blood) .... Use as directed to check your sugars 8)  Tracer Ii 3 Volt Battery Misc (Blood glucose monitoring suppl) .... Needs 3 volt lithium battery for one touch ultra meter to test her blood glucose- cpt code a4254 9)  Oscal 500/200 D-3 500-200 Mg-unit Tabs (Calcium-vitamin d) .... Take 1 tablet by mouth three times a day 10)  Lipitor 40 Mg Tabs (Atorvastatin calcium) .... Take 1 tab by mouth at bedtime 11)  Nexium 40 Mg Cpdr (Esomeprazole magnesium) .... Take 1 tablet by mouth once a day 12)  Neurontin 300 Mg Caps (Gabapentin) .... Take 1 capsule by mouth three times a day 13)  Proventil Hfa 108 (90 Base) Mcg/act Aers (Albuterol sulfate) .... 2 puffs every 4-6 hours as needed for shortness of breath 14)  Advair Diskus 250-50 Mcg/dose Aepb (Fluticasone-salmeterol) .... Inhale 1puff by mouth two times a day  Other Orders: T-Comprehensive Metabolic Panel ([id number])  Patient Instructions: 1)  Followup in 1 month. 2)  Take medications as prescribed. 3)  Please follow with your appointment for 2-D echo in order to complete evaluation of your SOB and auscultated heart murmur. 4)  You will be called with any abnormalities in the tests scheduled or performed today.  If you don't hear from [place] within a week from when the test was performed, you can assume that your test was normal. Prescriptions: HYDROCHLOROTHIAZIDE 25 MG TABS (HYDROCHLOROTHIAZIDE) Take 1 tablet by mouth once a day  #30 x 5   Entered and Authorized by:   Vassie Loll MD   Signed by:   Vassie Loll MD on 10/31/2010   Method used:   Electronically to        [id number] (retail)       [address]       [place], [place]  [address]       Ph: [phone] or [phone]       Fax: [phone]   RxID:   [id number] GLIMEPIRIDE 1 MG TABS (GLIMEPIRIDE)  Take 1 tablet by mouth once daily  #30 Tablet x 5   Entered and Authorized by:   Vassie Loll MD   Signed by:   Vassie Loll MD on  10/31/2010   Method used:   Electronically to        [id number]* (retail)       [address]       [place], [place]  [address]       Ph: [phone] or [phone]       Fax: [phone]   RxID:   [id number] NEURONTIN 300 MG CAPS (GABAPENTIN) Take 1 capsule by mouth three times a day  #90 x 2   Entered and Authorized by:   Vassie Loll MD   Signed by:   Vassie Loll MD on 10/31/2010   Method used:   Electronically to        [id number] (retail)       [address]       [place], [place]  [address]       Ph: [phone] or [phone]       Fax: [phone]   RxID:   [id number]    Orders Added: 1)  Est. Patient Level IV [[address]] 2)  T- Capillary Blood Glucose [82948] 3)  T-Lipid Profile [[id number]] 4)  T-Comprehensive Metabolic Panel [[id number]] 5)  2 D Echo [2 D Echo]    Prevention & Chronic Care Immunizations   Influenza vaccine: Fluvax MCR  (08/20/2010)   Influenza vaccine deferral: Deferred  (05/25/2010)   Influenza vaccine due: 07/19/2010    Tetanus booster: 09/18/2009: Tdap   Td booster deferral: Deferred  (05/07/2010)   Tetanus booster due: 09/19/2019    Pneumococcal vaccine: Not documented   Pneumococcal vaccine deferral: Deferred  (05/25/2010)    H. zoster vaccine: Not documented   H. zoster vaccine deferral: Refused  (05/25/2010)  Colorectal Screening   Hemoccult: Not documented   Hemoccult action/deferral: Deferred  (05/16/2009)   Hemoccult due: 05/16/2010    Colonoscopy: Location:  [facility].    (09/28/2008)   Colonoscopy action/deferral: Deferred  (05/16/2009)   Colonoscopy due: 09/28/2018  Other Screening   Pap smear: Not documented   Pap smear action/deferral: Not indicated S/P hysterectomy  (05/16/2009)    Mammogram: ASSESSMENT: Negative - BI-RADS 1^MM DIGITAL SCREENING  (07/06/2009)    DXA bone density scan: Not documented    DXA bone density action/deferral: Deferred  (05/25/2010)   Smoking status: never  (08/31/2010)  Diabetes Mellitus   HgbA1C: 6.8  (08/20/2010)   HgbA1C action/deferral: Ordered  (05/07/2010)   Hemoglobin A1C due: 08/16/2009    Eye exam: No diabetic retinopathy.  OU   (07/06/2009)   Eye exam due: 07/2010    Foot exam: yes  (05/07/2010)   Foot exam action/deferral: Do today   High risk foot: No  (03/25/2007)   Foot care education: Done  (03/16/2009)   Foot exam due: 05/16/2010    Urine microalbumin/creatinine ratio: 4.8  (05/07/2010)   Urine microalbumin action/deferral: Ordered   Urine microalbumin/cr due: 05/16/2010    Diabetes flowsheet reviewed?: Yes   Progress toward A1C goal: At goal  Lipids   Total Cholesterol: 150  (05/07/2010)   Lipid panel action/deferral: Lipid Panel ordered   LDL: 74  (05/07/2010)   LDL Direct: Not documented   HDL: 43  (05/07/2010)   Triglycerides:  165  (05/07/2010)    SGOT (AST): 16  (05/07/2010)   BMP action: Ordered   SGPT (ALT): 14  (05/07/2010) CMP ordered    Alkaline phosphatase: 98  (05/07/2010)   Total bilirubin: 0.3  (05/07/2010)    Lipid flowsheet reviewed?: Yes   Progress toward LDL goal: At goal  Hypertension   Last Blood Pressure: 131 / 75  (10/31/2010)   Serum creatinine: 1.16  (05/07/2010)   BMP action: Ordered   Serum potassium 4.4  (05/07/2010) CMP ordered     Hypertension flowsheet reviewed?: Yes   Progress toward BP goal: At goal  Self-Management Support :   Personal Goals (by the next clinic visit) :     Personal A1C goal: 6  (09/18/2009)     Personal blood pressure goal: 130/80  (09/18/2009)     Personal LDL goal: 70  (09/18/2009)    Patient will work on the following items until the next clinic visit to reach self-care goals:     Medications and monitoring: take my medicines every day  (10/31/2010)     Eating: eat foods that are low in salt, eat baked foods instead of fried foods  (10/31/2010)      Activity: take a 30 minute walk every day, park at the far end of the parking lot  (08/20/2010)     Home glucose monitoring frequency: 1 time daily  (09/18/2009)    Diabetes self-management support: Written self-care plan  (10/31/2010)   Diabetes care plan printed    Hypertension self-management support: Written self-care plan  (10/31/2010)   Hypertension self-care plan printed.    Lipid self-management support: Written self-care plan  (10/31/2010)   Lipid self-care plan printed.  Process Orders Check Orders Results:     [facility]: Order checked:     Vassie Loll MD NOT AUTHORIZED TO ORDER Tests Sent for requisitioning (November 08, 2010 9:47 PM):     10/31/2010: [facility] -- T-Lipid Profile [phone] (signed)     10/31/2010: [facility] -- T-Comprehensive Metabolic Panel [phone] (signed)     Laboratory Results   Blood Tests   Date/Time Received: October 31, 2010 4:02 PM Date/Time Reported: Alric Quan  October 31, 2010 4:02 PM   CBG Random:: 99mg /dL

## 2010-12-20 NOTE — Letter (Signed)
Summary: ONE TOUCH  ONE TOUCH   Imported By: Margie Billet 11/07/2010 14:44:37  _____________________________________________________________________  External Attachment:    Type:   Image     Comment:   External Document

## 2011-01-02 ENCOUNTER — Other Ambulatory Visit: Payer: Self-pay | Admitting: *Deleted

## 2011-01-04 ENCOUNTER — Other Ambulatory Visit: Payer: Self-pay | Admitting: *Deleted

## 2011-01-04 MED ORDER — VALSARTAN 160 MG PO TABS: 160.0000 mg | ORAL_TABLET | Freq: Every day | ORAL | Status: DC

## 2011-01-04 MED ORDER — FLUTICASONE-SALMETEROL 250-50 MCG/DOSE IN AEPB: 1.0000 | INHALATION_SPRAY | Freq: Two times a day (BID) | RESPIRATORY_TRACT | Status: DC

## 2011-01-04 MED ORDER — LEVOTHYROXINE SODIUM 25 MCG PO TABS: 25.0000 ug | ORAL_TABLET | Freq: Every day | ORAL | Status: DC

## 2011-01-08 ENCOUNTER — Other Ambulatory Visit: Payer: Self-pay | Admitting: Internal Medicine

## 2011-01-08 ENCOUNTER — Encounter: Payer: Self-pay | Admitting: Internal Medicine

## 2011-01-08 DIAGNOSIS — G43909 Migraine, unspecified, not intractable, without status migrainosus: Secondary | ICD-10-CM

## 2011-01-08 NOTE — Assessment & Plan Note (Signed)
Consider  Tapering off imipramine  And selecting an alternative migraine prophylactic medication for this patient  Given anticholinergic side effects of imipramine in the elderly population.

## 2011-01-09 NOTE — Progress Notes (Signed)
Summary: R eye/ hla  Phone Note Call from Patient   Summary of Call: pt states her R eye has been itchy and swollen since sun, wonders if her inhaler caused it, she is told that i can not tell her if that is what is wrong, she ask should she see eye md, informed she may go to urg care this pm or see eye md tomorrow if he/she has appt available. she is agreeable w/ going to urg care this pm Initial call taken by: Marin Roberts RN,  December 12, 2010 4:28 PM

## 2011-02-04 LAB — GLUCOSE, CAPILLARY: Glucose-Capillary: 73 mg/dL (ref 70–99)

## 2011-02-10 ENCOUNTER — Inpatient Hospital Stay (INDEPENDENT_AMBULATORY_CARE_PROVIDER_SITE_OTHER)
Admission: RE | Admit: 2011-02-10 | Discharge: 2011-02-10 | Disposition: A | Discharge status: Home / Self Care | Payer: Medicare Other | Source: Ambulatory Visit | Attending: Family Medicine | Admitting: Family Medicine

## 2011-02-10 DIAGNOSIS — I10 Essential (primary) hypertension: Secondary | ICD-10-CM

## 2011-02-11 ENCOUNTER — Ambulatory Visit (INDEPENDENT_AMBULATORY_CARE_PROVIDER_SITE_OTHER): Payer: Medicare Other | Admitting: Internal Medicine

## 2011-02-11 ENCOUNTER — Telehealth: Payer: Self-pay | Admitting: *Deleted

## 2011-02-11 ENCOUNTER — Encounter: Payer: Self-pay | Admitting: Internal Medicine

## 2011-02-11 DIAGNOSIS — I10 Essential (primary) hypertension: Secondary | ICD-10-CM

## 2011-02-11 DIAGNOSIS — E119 Type 2 diabetes mellitus without complications: Secondary | ICD-10-CM

## 2011-02-11 LAB — GLUCOSE, CAPILLARY: Glucose-Capillary: 137 mg/dL — ABNORMAL HIGH (ref 70–99)

## 2011-02-11 MED ORDER — VALSARTAN 160 MG PO TABS: 240.0000 mg | ORAL_TABLET | Freq: Every day | ORAL | Status: DC

## 2011-02-11 NOTE — Assessment & Plan Note (Addendum)
Lab Results  Component Value Date   NA 140 11/01/2010   K 4.0 11/01/2010   CL 101 11/01/2010   CO2 29 11/01/2010   BUN 22 11/01/2010   CREATININE 1.11 11/01/2010    BP Readings from Last 3 Encounters:  02/11/11 157/78  11/28/10 123/63  10/31/10 131/75   The reason for recent increased BP is unclear. May be due to stress? or medications dosing is not optimal. Will increase her diovan to 240 mg from 160, and recheck BP in 2 weeks. Also told her to restart HCTZ in 2 days after Diovan increase as concerns with dehydration from HCTZ. She fully understood this. May recheck BMET at next visit.

## 2011-02-11 NOTE — Telephone Encounter (Signed)
Pt called stating she went to [facility] yesterday for elevated BP.  It was 175/88 She was told to see PCP today.   She has been taking all her meds.   She does feel dizzy and feels heart is beating fast. Today 181/90 Will see today

## 2011-02-11 NOTE — Patient Instructions (Signed)
Please take all your medications as instructed in your instructions.   Please re-take your hydrochlorothiazide in 3 days after increase diovan.   Please call the Clinic for appointment or go to Emergency Department if your symptoms do not improve or get worse.

## 2011-02-11 NOTE — Progress Notes (Signed)
  Subjective:    [name], female    DOB: 01/24/1934, 77 y.o.   MRN: [id number]  HPI Patient is a 77 years old female with past medical history  as outlined here who comes to the Clinic for f/u her BP. She check her BP at home yesterday and found SBP about 180s, also had HA, so went to urgent care where her BP 156/70. She checked her BP this morning, still 150s, is worried about her BP, so went to our [facility] for check BP. Now she is doing well,  no fever, chill, chest pain, shortness of breath, hemoptysis, abdominal pain, nausea, vomiting, diarrhea, melena, dysuria, significant weight change. Denies recent smoking, alcohol or drug abuse. Has been taking all his medications as instructed.    Review of Systems Per HPI.  Current Outpatient Medications Current Outpatient Prescriptions  Medication Sig Dispense Refill  . ADVAIR DISKUS 250-50 MCG/DOSE AEPB inhale 1 dose by mouth twice a day  60 each  1  . albuterol (PROVENTIL HFA;VENTOLIN HFA) 108 (90 BASE) MCG/ACT inhaler Inhale 2 puffs into the lungs every 6 (six) hours as needed.        Marland Kitchen atorvastatin (LIPITOR) 40 MG tablet Take 40 mg by mouth daily.        Marland Kitchen esomeprazole (NEXIUM) 40 MG packet Take 40 mg by mouth every morning before breakfast.        . glimepiride (AMARYL) 1 MG tablet Take 1 mg by mouth every morning before breakfast.        . glucose blood test strip 1 each by Other route as needed. Use as instructed       . hydrochlorothiazide 25 MG tablet Take 25 mg by mouth daily.        Marland Kitchen imipramine (TOFRANIL) 50 MG tablet Take 50 mg by mouth at bedtime.        . Lancets 28G MISC by Does not apply route. Use as directed to check  your sugars       . levothyroxine (LEVOTHROID) 25 MCG tablet Take 1 tablet (25 mcg total) by mouth daily.  30 tablet  11  . DISCONTD: gabapentin (NEURONTIN) 300 MG capsule Take 300 mg by mouth 3 (three) times daily.          Allergies Aspirin and Codeine  Past Medical History  Diagnosis Date  .  Diabetes mellitus   . GERD (gastroesophageal reflux disease)   . Hypertension   . Hypothyroidism   . Anemia   . Hyperlipidemia   . Sinus arrhythmia   . History of migraine headaches     Past Surgical History  Procedure Date  . Abdominal hysterectomy      total abdominal hysterectomy in 1976 for reasons unknown       Objective:   Physical Exam General: Vital signs reviewed and noted. Well-developed,well-nourished,in no acute distress; alert,appropriate and cooperative throughout examination. Head: normocephalic, atraumatic. Neck: No deformities, masses, or tenderness noted. Lungs: Normal respiratory effort. Clear to auscultation BL without crackles or wheezes.  Heart: RRR. S1 and S2 normal without gallop, murmur, or rubs.  Abdomen: BS normoactive. Soft, Nondistended, non-tender.  No masses or organomegaly. Extremities: No pretibial edema.        Assessment & Plan:

## 2011-02-11 NOTE — Assessment & Plan Note (Addendum)
Lab Results  Component Value Date   HGBA1C 6.7 11/28/2010   CREATININE 1.11 11/01/2010   MICROALBUR 0.50 05/07/2010   MICRALBCREAT 4.8 05/07/2010   CHOL 145 11/01/2010   HDL 39* 11/01/2010   TRIG 469* 11/01/2010    Last eye exam and foot exam:    Component Value Date/Time   HMDIABEYEEXA  no diabetic retinopathy 07/06/2009   Her CBg has been well controlled and never above 150. Will continue current medication.

## 2011-02-20 LAB — GLUCOSE, CAPILLARY: Glucose-Capillary: 91 mg/dL (ref 70–99)

## 2011-02-25 LAB — GLUCOSE, CAPILLARY: Glucose-Capillary: 173 mg/dL — ABNORMAL HIGH (ref 70–99)

## 2011-02-26 ENCOUNTER — Encounter: Payer: Self-pay | Admitting: Internal Medicine

## 2011-02-26 ENCOUNTER — Ambulatory Visit (INDEPENDENT_AMBULATORY_CARE_PROVIDER_SITE_OTHER): Payer: Medicare Other | Admitting: Internal Medicine

## 2011-02-26 DIAGNOSIS — E039 Hypothyroidism, unspecified: Secondary | ICD-10-CM

## 2011-02-26 DIAGNOSIS — I10 Essential (primary) hypertension: Secondary | ICD-10-CM

## 2011-02-26 DIAGNOSIS — E119 Type 2 diabetes mellitus without complications: Secondary | ICD-10-CM

## 2011-02-26 LAB — BASIC METABOLIC PANEL WITH GFR
BUN: 21 mg/dL (ref 6–23)
Chloride: 99 mEq/L (ref 96–112)
Creat: 1.36 mg/dL — ABNORMAL HIGH (ref 0.40–1.20)
GFR, Est African American: 46 mL/min — ABNORMAL LOW (ref >60)
GFR, Est Non African American: 38 mL/min — ABNORMAL LOW (ref >60)

## 2011-02-26 LAB — POCT GLYCOSYLATED HEMOGLOBIN (HGB A1C): Hemoglobin A1C: 6.7

## 2011-02-26 NOTE — Assessment & Plan Note (Signed)
Well controlled. Continue current medicines. 

## 2011-02-26 NOTE — Progress Notes (Signed)
77 year old woman with history of hypertension diabetes hyperlipidemia and  anemia of chronic disease comes to the clinic for a followup on her hypertension. She was seen in the emergency room a week ago for blood pressure of systolic 190. This was according on her home blood pressure monitor. When she reached the emergency room the blood pressure that was about 160-170 systolic her blood pressure medicines were not changed at that time and she was asked to followup with her primary care physicians. Her blood pressure monitor today shows blood pressure readings between 110-150. She does not have any symptoms of hypo-or hypertension. She is compliant with her medicines. Her Diovan was increased during the last visit.  BP 141/76  Pulse 91  Temp(Src) 98.4 F (36.9 C) (Oral)  Ht 5\' 5"  (1.651 m)  Wt 137 lb 4.8 oz (62.279 kg)  BMI 22.85 kg/m2  SpO2 100%  LMP 02/10/1966  General Appearance:    Alert, cooperative, no distress, appears stated age  Head:    Normocephalic, without obvious abnormality, atraumatic  Eyes:    PERRL, conjunctiva/corneas clear, EOM's intact, fundi    benign, both eyes       Ears:    Normal TM's and external ear canals, both ears  Nose:   Nares normal, septum midline, mucosa normal, no drainage   or sinus tenderness  Throat:   Lips, mucosa, and tongue normal; teeth and gums normal  Neck:   Supple, symmetrical, trachea midline, no adenopathy;       thyroid:  No enlargement/tenderness/nodules; no carotid   bruit or JVD  Back:     Symmetric, no curvature, ROM normal, no CVA tenderness  Lungs:     Clear to auscultation bilaterally, respirations unlabored  Chest wall:    No tenderness or deformity  Heart:    Regular rate and rhythm, S1 and S2 normal, no murmur, rub   or gallop  Abdomen:     Soft, non-tender, bowel sounds active all four quadrants,    no masses, no organomegaly        Extremities:   Extremities normal, atraumatic, no cyanosis or edema  Pulses:   2+ and  symmetric all extremities  Skin:   Skin color, texture, turgor normal, no rashes or lesions  Lymph nodes:   Cervical, supraclavicular, and axillary nodes normal  Neurologic:   CNII-XII intact. Normal strength, sensation and reflexes      throughout

## 2011-02-26 NOTE — Patient Instructions (Signed)
If your blood pressure is more than 150 consistently, call the clinic to schedule an appointment I will see you in 4-6 weeks for follow up. I will give you another blood pressure medicine if needed at that time Avoid salt in your diet. Take a walk everyday

## 2011-02-26 NOTE — Assessment & Plan Note (Signed)
HbA1c at goal . Continue current medicines. Check her feet. no evidence of callus formation no cracking,  Trimmed nails no abnormalities.

## 2011-02-26 NOTE — Assessment & Plan Note (Signed)
Based on her medical problems her blood pressure would be less than 140. On manual check her blood pressure is 136/87. Her blood pressure has never really gone up to 150 on her home meter reading. I am not inclined to increase her antihypertensive regimen at this time I will continue it at current dose and follow her closely for next couple of months. If her blood pressure seems to be suboptimally controlled I will consider adding a beta blocker to her regimen. Her asthma seems to be well controlled. I will also check creatinine today as her dose of Diovan was increased at the last visit. I have instructed her on lifestyle modifications.

## 2011-03-04 ENCOUNTER — Other Ambulatory Visit: Payer: Self-pay | Admitting: *Deleted

## 2011-03-04 MED ORDER — ESOMEPRAZOLE MAGNESIUM 40 MG PO PACK: 40.0000 mg | PACK | Freq: Every day | ORAL | Status: DC

## 2011-03-06 ENCOUNTER — Other Ambulatory Visit (INDEPENDENT_AMBULATORY_CARE_PROVIDER_SITE_OTHER): Payer: Medicare Other | Admitting: *Deleted

## 2011-03-06 DIAGNOSIS — E785 Hyperlipidemia, unspecified: Secondary | ICD-10-CM

## 2011-03-06 MED ORDER — IMIPRAMINE HCL 50 MG PO TABS: 50.0000 mg | ORAL_TABLET | Freq: Every day | ORAL | Status: DC

## 2011-03-06 MED ORDER — ATORVASTATIN CALCIUM 40 MG PO TABS: 40.0000 mg | ORAL_TABLET | Freq: Every day | ORAL | Status: DC

## 2011-03-06 NOTE — Telephone Encounter (Signed)
[facility] has pt taking 1 tablet by mouth twice a day.

## 2011-03-12 ENCOUNTER — Ambulatory Visit: Payer: Self-pay | Admitting: Internal Medicine

## 2011-03-30 ENCOUNTER — Emergency Department (HOSPITAL_COMMUNITY)
Admission: EM | Admit: 2011-03-30 | Discharge: 2011-03-31 | Disposition: A | Discharge status: Home / Self Care | Payer: Medicare Other | Attending: Emergency Medicine | Admitting: Emergency Medicine

## 2011-03-30 ENCOUNTER — Emergency Department (HOSPITAL_COMMUNITY): Payer: Medicare Other

## 2011-03-30 ENCOUNTER — Inpatient Hospital Stay (INDEPENDENT_AMBULATORY_CARE_PROVIDER_SITE_OTHER)
Admission: RE | Admit: 2011-03-30 | Discharge: 2011-03-30 | Disposition: A | Discharge status: Home / Self Care | Payer: Medicare Other | Source: Ambulatory Visit | Attending: Emergency Medicine | Admitting: Emergency Medicine

## 2011-03-30 DIAGNOSIS — I10 Essential (primary) hypertension: Secondary | ICD-10-CM | POA: Insufficient documentation

## 2011-03-30 DIAGNOSIS — Z79899 Other long term (current) drug therapy: Secondary | ICD-10-CM | POA: Insufficient documentation

## 2011-03-30 DIAGNOSIS — R110 Nausea: Secondary | ICD-10-CM | POA: Insufficient documentation

## 2011-03-30 DIAGNOSIS — E119 Type 2 diabetes mellitus without complications: Secondary | ICD-10-CM | POA: Insufficient documentation

## 2011-03-30 DIAGNOSIS — K219 Gastro-esophageal reflux disease without esophagitis: Secondary | ICD-10-CM | POA: Insufficient documentation

## 2011-03-30 DIAGNOSIS — R51 Headache: Secondary | ICD-10-CM

## 2011-03-30 DIAGNOSIS — G43909 Migraine, unspecified, not intractable, without status migrainosus: Secondary | ICD-10-CM | POA: Insufficient documentation

## 2011-03-30 LAB — BASIC METABOLIC PANEL
BUN: 16 mg/dL (ref 6–23)
CO2: 29 mEq/L (ref 19–32)
Chloride: 100 mEq/L (ref 96–112)
Creatinine, Ser: 1.03 mg/dL (ref 0.4–1.2)
Glucose, Bld: 127 mg/dL — ABNORMAL HIGH (ref 70–99)
Potassium: 3.6 mEq/L (ref 3.5–5.1)

## 2011-03-30 LAB — DIFFERENTIAL
Eosinophils Absolute: 0.2 10*3/uL (ref 0.0–0.7)
Eosinophils Relative: 2 % (ref 0–5)
Lymphocytes Relative: 23 % (ref 12–46)
Lymphs Abs: 2.2 10*3/uL (ref 0.7–4.0)
Monocytes Absolute: 0.5 10*3/uL (ref 0.1–1.0)
Monocytes Relative: 6 % (ref 3–12)

## 2011-03-30 LAB — APTT: aPTT: 26 seconds (ref 24–37)

## 2011-03-30 LAB — CBC
HCT: 36 % (ref 36.0–46.0)
MCH: 28.5 pg (ref 26.0–34.0)
MCHC: 32.8 g/dL (ref 30.0–36.0)
MCV: 87 fL (ref 78.0–100.0)
Platelets: 295 10*3/uL (ref 150–400)
RDW: 14.7 % (ref 11.5–15.5)

## 2011-03-30 MED ORDER — IOHEXOL 350 MG/ML SOLN: 50.0000 mL | Freq: Once | INTRAVENOUS | Status: AC | PRN

## 2011-04-02 ENCOUNTER — Ambulatory Visit (INDEPENDENT_AMBULATORY_CARE_PROVIDER_SITE_OTHER): Payer: Medicare Other | Admitting: Internal Medicine

## 2011-04-02 ENCOUNTER — Telehealth: Payer: Self-pay | Admitting: *Deleted

## 2011-04-02 ENCOUNTER — Encounter: Payer: Self-pay | Admitting: Internal Medicine

## 2011-04-02 DIAGNOSIS — M25559 Pain in unspecified hip: Secondary | ICD-10-CM

## 2011-04-02 DIAGNOSIS — E785 Hyperlipidemia, unspecified: Secondary | ICD-10-CM

## 2011-04-02 DIAGNOSIS — M25551 Pain in right hip: Secondary | ICD-10-CM

## 2011-04-02 LAB — GLUCOSE, CAPILLARY: Glucose-Capillary: 108 mg/dL — ABNORMAL HIGH (ref 70–99)

## 2011-04-02 MED ORDER — ONDANSETRON HCL 4 MG PO TABS: 4.0000 mg | ORAL_TABLET | Freq: Three times a day (TID) | ORAL | Status: DC | PRN

## 2011-04-02 MED ORDER — IBUPROFEN 600 MG PO TABS
600.0000 mg | ORAL_TABLET | Freq: Three times a day (TID) | ORAL | Status: AC
Start: 2011-04-02 — End: 2011-04-12

## 2011-04-02 NOTE — Assessment & Plan Note (Signed)
Right hip pain started since last 2 days relieved by ibuprofen and heating pads. Had similar pain in 1990 which was treated without effect. No imaging studies on file, focal tenderness on palpation without any other abnormality on physical exam noted. No fever redness or swelling. Able to bear weight.  Most likely secondary to myositis or bursitis. A component of prostatitis might also be sent. Bone fracture or avascular necrosis unlikely based on history and physical exam. We'll treat with stretching exercise, cord compression and ibuprofen 3 times a day for one week. If not better in a week we'll ask her to return to the clinic. At that time an x-ray might need to be done to rule out any other pathology. If needed for the sports medicine for joint injection might be considered.

## 2011-04-02 NOTE — Patient Instructions (Signed)
Your Hip pain is most likely because of inflammation of the muscle. Do some mild stretching exercises of your hip, use cold compressions and ibuprofen as directed for one week. If her pain is not better in next 2-3 weeks return to the clinic. We will do an x-ray at that time and refer you to sports medicine for further treatment if needed.

## 2011-04-02 NOTE — Assessment & Plan Note (Signed)
Will order lipid profile for future draw. Patient is not fasting today

## 2011-04-02 NOTE — Telephone Encounter (Signed)
Will see today by PCP for con't hip pain.

## 2011-04-02 NOTE — Progress Notes (Signed)
77 year old woman with diabetes, hypertension, hyperlipidemia, migraine, hypothyroidism and GERD comes to the clinic for pain in the right hip.  Hip pain: Since last 3 days, dull, present almost throughout the day more when she bears weight on that side, ibuprofen and warm compression has been admitted. She had this kind of pain back in 1992 at which time she took Darvocet which helped her pain. She did not have any further pain after that. She was told that she has arthritis at that time. No swelling, fever, trauma or fall. She has never had an x-ray of that joint. She has never seen an orthopedic doctor for this problem although she is seen an orthopedic doctor for  left shoulder pain in the past   BP 119/61  Pulse 86  Temp(Src) 98.7 F (37.1 C) (Oral)  Ht 5\' 5"  (1.651 m)  Wt 135 lb 11.2 oz (61.553 kg)  BMI 22.58 kg/m2  SpO2 100%  LMP 02/10/1966  General Appearance:    Alert, cooperative, no distress, appears stated age  Head:    Normocephalic, without obvious abnormality, atraumatic  Eyes:    PERRL, conjunctiva/corneas clear, EOM's intact, fundi    benign, both eyes  Ears:    Normal TM's and external ear canals, both ears  Nose:   Nares normal, septum midline, mucosa normal, no drainage    or sinus tenderness  Throat:   Lips, mucosa, and tongue normal; teeth and gums normal  Neck:   Supple, symmetrical, trachea midline, no adenopathy;    thyroid:  no enlargement/tenderness/nodules; no carotid   bruit or JVD  Back:     Symmetric, no curvature, ROM normal, no CVA tenderness  Lungs:     Clear to auscultation bilaterally, respirations unlabored  Chest Wall:    No tenderness or deformity   Heart:    Regular rate and rhythm, S1 and S2 normal, no murmur, rub   or gallop     Abdomen:     Soft, non-tender, bowel sounds active all four quadrants,    no masses, no organomegaly  Extremities:   Extremities normal, atraumatic, no cyanosis or edema. Tenderness to palpation on the right hip and  upper thigh laterally. No bony deformity face. Full range of motion of the left hip and knee joint. No joint instability, swelling, redness or other abnormality identified.   Pulses:   2+ and symmetric all extremities  Skin:   Skin color, texture, turgor normal, no rashes or lesions  Lymph nodes:   Cervical, supraclavicular, and axillary nodes normal  Neurologic:   CNII-XII intact, normal strength, sensation and reflexes    throughout    Constitutional: Denies fever, chills, diaphoresis, appetite change and fatigue.  HEENT: Denies photophobia, eye pain, redness, hearing loss, ear pain, congestion, sore throat, rhinorrhea, sneezing, mouth sores, trouble swallowing, neck pain, neck stiffness and tinnitus.   Respiratory: Denies SOB, DOE, cough, chest tightness,  and wheezing.   Cardiovascular: Denies chest pain, palpitations and leg swelling.  Gastrointestinal: Denies nausea, vomiting, abdominal pain, diarrhea, constipation, blood in stool and abdominal distention.  Genitourinary: Denies dysuria, urgency, frequency, hematuria, flank pain and difficulty urinating.  Musculoskeletal: Denies myalgias, back pain, joint swelling, arthralgias and gait problem.  Skin: Denies pallor, rash and wound.  Neurological: Denies dizziness, seizures, syncope, weakness, light-headedness, numbness and headaches.  Hematological: Denies adenopathy. Easy bruising, personal or family bleeding history  Psychiatric/Behavioral: Denies suicidal ideation, mood changes, confusion, nervousness, sleep disturbance and agitation

## 2011-04-02 NOTE — Telephone Encounter (Signed)
Pt was seen at [facility] on Saturday.  BP 208/?, c/o hip pain.  She had CT  done on head because of severe headache.  She was given meds for headache while there but not sent home on meds. C/o hip pain, nausea

## 2011-04-09 ENCOUNTER — Other Ambulatory Visit: Payer: Medicare Other

## 2011-04-09 ENCOUNTER — Other Ambulatory Visit: Payer: Self-pay | Admitting: Internal Medicine

## 2011-04-09 DIAGNOSIS — M25551 Pain in right hip: Secondary | ICD-10-CM

## 2011-04-09 DIAGNOSIS — E785 Hyperlipidemia, unspecified: Secondary | ICD-10-CM

## 2011-04-09 LAB — LIPID PANEL
HDL: 46 mg/dL (ref >39)
LDL Cholesterol: 82 mg/dL (ref 0–99)
Total CHOL/HDL Ratio: 3.3 Ratio
Triglycerides: 123 mg/dL (ref <150)

## 2011-04-09 MED ORDER — IMIPRAMINE HCL 50 MG PO TABS: 50.0000 mg | ORAL_TABLET | Freq: Two times a day (BID) | ORAL | Status: DC

## 2011-04-11 ENCOUNTER — Other Ambulatory Visit: Payer: Self-pay | Admitting: Internal Medicine

## 2011-04-11 DIAGNOSIS — Z1231 Encounter for screening mammogram for malignant neoplasm of breast: Secondary | ICD-10-CM

## 2011-04-26 ENCOUNTER — Ambulatory Visit (HOSPITAL_COMMUNITY)
Admission: RE | Admit: 2011-04-26 | Discharge: 2011-04-26 | Disposition: A | Discharge status: Home / Self Care | Payer: Medicare Other | Source: Ambulatory Visit | Attending: Family Medicine | Admitting: Family Medicine

## 2011-04-26 DIAGNOSIS — Z1231 Encounter for screening mammogram for malignant neoplasm of breast: Secondary | ICD-10-CM | POA: Insufficient documentation

## 2011-04-30 ENCOUNTER — Ambulatory Visit (INDEPENDENT_AMBULATORY_CARE_PROVIDER_SITE_OTHER): Payer: Medicare Other | Admitting: Internal Medicine

## 2011-04-30 ENCOUNTER — Encounter: Payer: Self-pay | Admitting: Internal Medicine

## 2011-04-30 VITALS — BP 138/78 | HR 84 | Temp 98.7°F | Wt 138.5 lb

## 2011-04-30 DIAGNOSIS — M79609 Pain in unspecified limb: Secondary | ICD-10-CM

## 2011-04-30 DIAGNOSIS — M79604 Pain in right leg: Secondary | ICD-10-CM

## 2011-04-30 LAB — GLUCOSE, CAPILLARY: Glucose-Capillary: 114 mg/dL — ABNORMAL HIGH (ref 70–99)

## 2011-04-30 NOTE — Patient Instructions (Signed)
I will call you if your blood work shows something abnormal Otherwise try tylenol as needed for your leg pain Follow up in 3 months

## 2011-04-30 NOTE — Progress Notes (Signed)
77 year old woman with past medical history of hypothyroidism, diabetes, hypertension, hyperlipidemia, DJD of the back and migraine comes to the clinic complaining of pain in both her thighs. Since last 3 weeks. aching kind of pain Pain mostly while walking. No morning stiffness. No pain on the arms, chest, back or elsewhere. Pain not radiating anywhere. Pain relieved by Tylenol. says she had similar kind of pain before at which time she was given prednisone and that relieved the pain. No other complaints today.  BP 138/78  Pulse 84  Temp(Src) 98.7 F (37.1 C) (Oral)  Wt 138 lb 8 oz (62.823 kg)  SpO2 98%  LMP 02/10/1966  General Appearance:    Alert, cooperative, no distress, appears stated age  Head:    Normocephalic, without obvious abnormality, atraumatic  Eyes:    PERRL, conjunctiva/corneas clear, EOM's intact, fundi    benign, both eyes  Ears:    Normal TM's and external ear canals, both ears  Nose:   Nares normal, septum midline, mucosa normal, no drainage    or sinus tenderness  Throat:   Lips, mucosa, and tongue normal; teeth and gums normal  Neck:   Supple, symmetrical, trachea midline, no adenopathy;    thyroid:  no enlargement/tenderness/nodules; no carotid   bruit or JVD  Back:     Symmetric, no curvature, ROM normal, no CVA tenderness  Lungs:     Clear to auscultation bilaterally, respirations unlabored  Chest Wall:    No tenderness or deformity   Heart:    Regular rate and rhythm, S1 and S2 normal, no murmur, rub   or gallop  Abdomen:     Soft, non-tender, bowel sounds active all four quadrants,    no masses, no organomegaly  Extremities:   Extremities normal, atraumatic, no cyanosis or edema,. No soreness on proximal thighs .  Pulses:   2+ and symmetric all extremities  Skin:   Skin color, texture, turgor normal, no rashes or lesions  Lymph nodes:   Cervical, supraclavicular, and axillary nodes normal  Neurologic:   CNII-XII intact, normal strength, sensation  and reflexes    throughout   Constitutional: Denies fever, chills, diaphoresis, appetite change and fatigue.  HEENT: Denies photophobia, eye pain, redness, hearing loss, ear pain, congestion, sore throat, rhinorrhea, sneezing, mouth sores, trouble swallowing, neck pain, neck stiffness and tinnitus.   Respiratory: Denies SOB, DOE, cough, chest tightness,  and wheezing.   Cardiovascular: Denies chest pain, palpitations and leg swelling.  Gastrointestinal: Denies nausea, vomiting, abdominal pain, diarrhea, constipation, blood in stool and abdominal distention.  Genitourinary: Denies dysuria, urgency, frequency, hematuria, flank pain and difficulty urinating.  Musculoskeletal: Denies myalgias, back pain, joint swelling, arthralgias and gait problem.  Skin: Denies pallor, rash and wound.  Neurological: Denies dizziness, seizures, syncope, weakness, light-headedness, numbness and headaches.  Hematological: Denies adenopathy. Easy bruising, personal or family bleeding history  Psychiatric/Behavioral: Denies suicidal ideation, mood changes, confusion, nervousness, sleep disturbance and agitation

## 2011-04-30 NOTE — Assessment & Plan Note (Signed)
Patient will told that she has polymyalgia rheumatica and was treated with prednisone. Her ESR at that time was about 30. This was back in 2009. She was on prednisone for a month before it was stopped due to a worsening diabetes. Her presentation does not fit the diagnostic criteria for followup polymyalgia rheumatica. She does not have any headache or vision changes suggestive of GCA. Her pain is relieved with Tylenol so I Will ask her to continue doing that for now. Check sedimentation rate and CRP She has been on statin for a long time, so unlikely to be the cause of her thigh pain. I will give a prednisone taper if sedimentation rate more than 100.

## 2011-05-01 LAB — SEDIMENTATION RATE: Sed Rate: 21 mm/hr (ref 0–22)

## 2011-05-06 ENCOUNTER — Other Ambulatory Visit (INDEPENDENT_AMBULATORY_CARE_PROVIDER_SITE_OTHER): Payer: Medicare Other | Admitting: *Deleted

## 2011-05-06 DIAGNOSIS — E119 Type 2 diabetes mellitus without complications: Secondary | ICD-10-CM

## 2011-05-06 MED ORDER — GLIMEPIRIDE 1 MG PO TABS: 1.0000 mg | ORAL_TABLET | Freq: Every day | ORAL | Status: DC

## 2011-05-07 ENCOUNTER — Other Ambulatory Visit (INDEPENDENT_AMBULATORY_CARE_PROVIDER_SITE_OTHER): Payer: Medicare Other | Admitting: *Deleted

## 2011-05-07 DIAGNOSIS — E119 Type 2 diabetes mellitus without complications: Secondary | ICD-10-CM

## 2011-05-07 MED ORDER — LANCETS 28G MISC: Status: DC

## 2011-05-07 MED ORDER — HYDROCHLOROTHIAZIDE 25 MG PO TABS: 25.0000 mg | ORAL_TABLET | Freq: Every day | ORAL | Status: DC

## 2011-05-07 MED ORDER — GLUCOSE BLOOD VI STRP: ORAL_STRIP | Status: DC

## 2011-06-14 ENCOUNTER — Other Ambulatory Visit: Payer: Self-pay | Admitting: *Deleted

## 2011-06-14 MED ORDER — LORATADINE 10 MG PO TABS: 10.0000 mg | ORAL_TABLET | Freq: Every day | ORAL | Status: DC

## 2011-06-14 NOTE — Telephone Encounter (Signed)
Not current med list.  Pt states she takes it for her allergies ("helps a lot") and prefers buying it with a rx. Last refilled 02/01/11. Rx written 05/07/2010.

## 2011-07-09 ENCOUNTER — Other Ambulatory Visit: Payer: Self-pay | Admitting: *Deleted

## 2011-07-09 MED ORDER — FLUTICASONE-SALMETEROL 250-50 MCG/DOSE IN AEPB: 1.0000 | INHALATION_SPRAY | Freq: Two times a day (BID) | RESPIRATORY_TRACT | Status: DC

## 2011-07-09 NOTE — Telephone Encounter (Signed)
Last refill 7/20 

## 2011-07-30 ENCOUNTER — Encounter: Payer: Self-pay | Admitting: Internal Medicine

## 2011-07-30 ENCOUNTER — Ambulatory Visit (INDEPENDENT_AMBULATORY_CARE_PROVIDER_SITE_OTHER): Payer: Medicare Other | Admitting: Internal Medicine

## 2011-07-30 DIAGNOSIS — I10 Essential (primary) hypertension: Secondary | ICD-10-CM

## 2011-07-30 DIAGNOSIS — J449 Chronic obstructive pulmonary disease, unspecified: Secondary | ICD-10-CM

## 2011-07-30 DIAGNOSIS — E119 Type 2 diabetes mellitus without complications: Secondary | ICD-10-CM

## 2011-07-30 LAB — POCT GLYCOSYLATED HEMOGLOBIN (HGB A1C): Hemoglobin A1C: 6.6

## 2011-07-30 NOTE — Assessment & Plan Note (Signed)
Recheck CBC. If Anemia stable, may need a 2-D echo if shortness of breath does not improve. Continue albuterol and Advair as before. Followup in one month.

## 2011-07-30 NOTE — Assessment & Plan Note (Signed)
Well controlled. Continue current medication.  

## 2011-07-30 NOTE — Progress Notes (Signed)
77 year old woman with past medical history of hypothyroidism, diabetes, hypertension, hyperlipidemia, DJD of the back and migraine comes to the clinic  Complains of shortness of breath with climbing one flight of stairs. He does say she could climb up to 2-3 flights of stairs. No chest pain, bleeding, peripheral edema, weight gain. She does not wake up at night short of breath. Her symptoms are only with exertion. Otherwise feeling better. Respiratory-wise she says she's otherwise much better after starting Advair.. No other complaints. Compliant with her medications.  Physical exam  BP 125/58  Pulse 91  Temp(Src) 97.9 F (36.6 C) (Oral)  Ht 5\' 4"  (1.626 m)  Wt 134 lb (60.782 kg)  BMI 23.00 kg/m2  SpO2 99%  LMP 02/10/1966  General Appearance:    Alert, cooperative, no distress, appears stated age  Head:    Normocephalic, without obvious abnormality, atraumatic  Eyes:    PERRL, conjunctiva/corneas clear, EOM's intact, fundi    benign, both eyes  Ears:    Normal TM's and external ear canals, both ears  Nose:   Nares normal, septum midline, mucosa normal, no drainage    or sinus tenderness  Throat:   Lips, mucosa, and tongue normal; teeth and gums normal  Neck:   Supple, symmetrical, trachea midline, no adenopathy;    thyroid:  no enlargement/tenderness/nodules; no carotid   bruit or JVD  Back:     Symmetric, no curvature, ROM normal, no CVA tenderness  Lungs:     Clear to auscultation bilaterally, respirations unlabored  Chest Wall:    No tenderness or deformity   Heart:    Regular rate and rhythm, S1 and S2 normal, no murmur, rub   or gallop  Abdomen:     Soft, non-tender, bowel sounds active all four quadrants,    no masses, no organomegaly  Extremities:   Extremities normal, atraumatic, no cyanosis or edema  Pulses:   2+ and symmetric all extremities  Skin:   Skin color, texture, turgor normal, no rashes or lesions  Lymph nodes:   Cervical, supraclavicular, and axillary nodes  normal  Neurologic:   CNII-XII intact, normal strength, sensation and reflexes    throughout    Review of system  Constitutional: Denies fever, chills, diaphoresis, appetite change and fatigue.  HEENT: Denies photophobia, eye pain, redness, hearing loss, ear pain, congestion, sore throat, rhinorrhea, sneezing, mouth sores, trouble swallowing, neck pain, neck stiffness and tinnitus.   Respiratory: Denies SOB, , cough, chest tightness,  and wheezing.   has dyspnea on exertion Cardiovascular: Denies chest pain, palpitations and leg swelling.  Gastrointestinal: Denies nausea, vomiting, abdominal pain, diarrhea, constipation, blood in stool and abdominal distention.  Genitourinary: Denies dysuria, urgency, frequency, hematuria, flank pain and difficulty urinating.  Musculoskeletal: Denies myalgias, back pain, joint swelling, arthralgias and gait problem.  Skin: Denies pallor, rash and wound.  Neurological: Denies dizziness, seizures, syncope, weakness, light-headedness, numbness and headaches.  Hematological: Denies adenopathy. Easy bruising, personal or family bleeding history  Psychiatric/Behavioral: Denies suicidal ideation, mood changes, confusion, nervousness, sleep disturbance and agitation

## 2011-07-30 NOTE — Assessment & Plan Note (Signed)
Well controlled 

## 2011-07-30 NOTE — Patient Instructions (Signed)
If you are still feeling short of breath after 1 month, let me know. We will do an echocardiogram of your heart.  Follow up in 3 months

## 2011-07-31 ENCOUNTER — Other Ambulatory Visit: Payer: Self-pay | Admitting: *Deleted

## 2011-07-31 DIAGNOSIS — E785 Hyperlipidemia, unspecified: Secondary | ICD-10-CM

## 2011-07-31 DIAGNOSIS — M25551 Pain in right hip: Secondary | ICD-10-CM

## 2011-07-31 LAB — CBC WITH DIFFERENTIAL/PLATELET
Eosinophils Absolute: 0.3 10*3/uL (ref 0.0–0.7)
HCT: 34.7 % — ABNORMAL LOW (ref 36.0–46.0)
Hemoglobin: 10.9 g/dL — ABNORMAL LOW (ref 12.0–15.0)
Lymphs Abs: 2.4 10*3/uL (ref 0.7–4.0)
MCH: 28.2 pg (ref 26.0–34.0)
Monocytes Absolute: 0.4 10*3/uL (ref 0.1–1.0)
Monocytes Relative: 6 % (ref 3–12)
Neutro Abs: 3.2 10*3/uL (ref 1.7–7.7)
Neutrophils Relative %: 51 % (ref 43–77)
RBC: 3.87 MIL/uL (ref 3.87–5.11)

## 2011-07-31 MED ORDER — IMIPRAMINE HCL 50 MG PO TABS: 50.0000 mg | ORAL_TABLET | Freq: Two times a day (BID) | ORAL | Status: DC

## 2011-08-13 ENCOUNTER — Ambulatory Visit (HOSPITAL_COMMUNITY)
Admission: RE | Admit: 2011-08-13 | Discharge: 2011-08-13 | Disposition: A | Discharge status: Home / Self Care | Payer: Medicare Other | Source: Ambulatory Visit | Attending: Internal Medicine | Admitting: Internal Medicine

## 2011-08-13 ENCOUNTER — Ambulatory Visit (INDEPENDENT_AMBULATORY_CARE_PROVIDER_SITE_OTHER): Payer: Medicare Other | Admitting: Internal Medicine

## 2011-08-13 ENCOUNTER — Encounter: Payer: Self-pay | Admitting: Internal Medicine

## 2011-08-13 VITALS — BP 155/70 | HR 82 | Temp 97.3°F | Ht 64.0 in | Wt 134.3 lb

## 2011-08-13 DIAGNOSIS — R0989 Other specified symptoms and signs involving the circulatory and respiratory systems: Secondary | ICD-10-CM

## 2011-08-13 DIAGNOSIS — J4489 Other specified chronic obstructive pulmonary disease: Secondary | ICD-10-CM | POA: Insufficient documentation

## 2011-08-13 DIAGNOSIS — R0602 Shortness of breath: Secondary | ICD-10-CM | POA: Insufficient documentation

## 2011-08-13 DIAGNOSIS — I10 Essential (primary) hypertension: Secondary | ICD-10-CM

## 2011-08-13 DIAGNOSIS — E119 Type 2 diabetes mellitus without complications: Secondary | ICD-10-CM | POA: Insufficient documentation

## 2011-08-13 DIAGNOSIS — R0609 Other forms of dyspnea: Secondary | ICD-10-CM

## 2011-08-13 DIAGNOSIS — Z23 Encounter for immunization: Secondary | ICD-10-CM

## 2011-08-13 DIAGNOSIS — Z299 Encounter for prophylactic measures, unspecified: Secondary | ICD-10-CM

## 2011-08-13 DIAGNOSIS — D649 Anemia, unspecified: Secondary | ICD-10-CM

## 2011-08-13 DIAGNOSIS — J449 Chronic obstructive pulmonary disease, unspecified: Secondary | ICD-10-CM | POA: Insufficient documentation

## 2011-08-13 DIAGNOSIS — R0600 Dyspnea, unspecified: Secondary | ICD-10-CM

## 2011-08-13 LAB — CBC WITH DIFFERENTIAL/PLATELET
Eosinophils Relative: 5 % (ref 0–5)
HCT: 36.7 % (ref 36.0–46.0)
Lymphocytes Relative: 38 % (ref 12–46)
Lymphs Abs: 2.2 10*3/uL (ref 0.7–4.0)
MCH: 29.4 pg (ref 26.0–34.0)
MCV: 90.6 fL (ref 78.0–100.0)
Monocytes Absolute: 0.4 10*3/uL (ref 0.1–1.0)
RBC: 4.05 MIL/uL (ref 3.87–5.11)
WBC: 5.8 10*3/uL (ref 4.0–10.5)

## 2011-08-13 NOTE — Progress Notes (Signed)
  Subjective:    [name], female    DOB: 01/24/1934, 77 y.o.   MRN: [id number]  HPI [name] is a pleasant 47 year woman with past with history of mild COPD, HTN, hypothyroidism who comes the clinic with chief complaint of shortness breath for about 4-6 weeks now. She was seen by Dr. Scot Dock, her PCP on 07/30/2011 for the same problem. She did not have any COPD exacerbation at that point of time- which is the case today too. She does complain of minimal shortness of breath with activity-walking or doing dishes-for about 4-6 weeks now. She also complains of weakness along with those episodes-but denies any nausea, vomiting, perspiration, chest pain, syncopal episode, blackout of eyes. She denies any fever, chills, cough, sputum production, wheezing, shortness of breath at rest.  She does have a 2-D echo done in December 2011 which showed EF of 60-70% with grade 1 diastolic dysfunction and mild dynamic outflow obstruction.     Review of Systems    as per history of present illness, all other systems reviewed and negative. Objective:   Physical Exam Constitutional: Vital signs reviewed.  Patient is a well-developed and well-nourished in no acute distress and cooperative with exam. Alert and oriented x3.  Head: Normocephalic and atraumatic Mouth: no erythema or exudates, MMM Eyes: PERRL, EOMI, conjunctivae normal, No scleral icterus.  Neck: Supple, Trachea midline normal ROM, No JVD Cardiovascular: RRR, S1 normal, S2 normal, no MRG Pulmonary/Chest: CTAB, no wheezes, rales, or rhonchi Abdominal: Soft. Non-tender, non-distended, bowel sounds are normal Musculoskeletal: No joint deformities, erythema, or stiffness, ROM full and no nontender Neurological: A&O x3, Strenght is normal and symmetric bilaterally, cranial nerve II-XII are grossly intact, no focal motor deficit, sensory intact to light touch bilaterally.  Skin: Warm, dry and intact. No rash, cyanosis, or clubbing.           Assessment & Plan:

## 2011-08-13 NOTE — Assessment & Plan Note (Signed)
[name] is having mild dyspnea with exertion along with weakness for about 4-6 weeks now. Evaluated for the same 2 weeks before in the clinic. She does not have any history of CHF and also has no pedal edema or JVD, vitals stable. No signs of COPD exacerbation as there is no wheezing or significant shortness of breath, O2 saturations 97% on room air. Also she had to use albuterol- rescue inhaler- just once in past 4 weeks. Her hemoglobin was 10.9 on 07/30/2011 which was the lowest reading we had in our system. She denies any blood in the stool-redness I will check CBC again today to make sure she's not dropping hemoglobin low-which might be on the likely reason for her having episodes of weakness with dyspnea. Also I will check chest x-ray two-view today to rule out any pulmonary edema, consolidation. Her 2-D echo was done in December 2012 which did show grade 1 diastolic dysfunction with EF of 65-70% with mild hyperdynamic outflow obstruction. If she does not get better with continuation of her inhalers  and  watching- it would be wise to consider having 2-D echo to check if there is any worsening of her outflow obstruction or diastolic dysfunction. Also meanwhile if she develops syncope, exertional chest pain-she might need an echo earlier.

## 2011-08-13 NOTE — Assessment & Plan Note (Signed)
I will recheck CBC today

## 2011-08-13 NOTE — Patient Instructions (Addendum)
Please make followup appointment in 2-3 months. Please get a chest x-ray done today and I will give you a call if there is anything abnormal.  I will give you call the lab tests are abnormal. Please use your inhalers regularly. If you shortness of breath does not get better, please call the clinic for early appointment.

## 2011-08-20 ENCOUNTER — Telehealth: Payer: Self-pay | Admitting: *Deleted

## 2011-08-20 LAB — GLUCOSE, CAPILLARY: Glucose-Capillary: 112 — ABNORMAL HIGH

## 2011-08-20 NOTE — Telephone Encounter (Signed)
Call from pt having a lot of pain in her breast, shoulder and upper back and under left breast.  Shortness of breath at times when she walks.  Ok when she sits down.  Pt was given an appointment for 08/22/2011.  Pt was informed that she should go to the ER if symptoms worsen.

## 2011-08-22 ENCOUNTER — Encounter: Payer: Self-pay | Admitting: Internal Medicine

## 2011-08-22 ENCOUNTER — Inpatient Hospital Stay (HOSPITAL_COMMUNITY)
Admission: AD | Admit: 2011-08-22 | Discharge: 2011-08-24 | DRG: 287 | Disposition: A | Discharge status: Home / Self Care | Payer: Medicare Other | Source: Ambulatory Visit | Attending: Internal Medicine | Admitting: Internal Medicine

## 2011-08-22 ENCOUNTER — Ambulatory Visit (HOSPITAL_COMMUNITY)
Admission: RE | Admit: 2011-08-22 | Discharge: 2011-08-22 | Disposition: A | Discharge status: Home / Self Care | Payer: Medicare Other | Source: Ambulatory Visit | Attending: Interventional Cardiology | Admitting: Interventional Cardiology

## 2011-08-22 ENCOUNTER — Other Ambulatory Visit (HOSPITAL_COMMUNITY): Payer: Medicare Other

## 2011-08-22 ENCOUNTER — Ambulatory Visit (INDEPENDENT_AMBULATORY_CARE_PROVIDER_SITE_OTHER): Payer: Medicare Other | Admitting: Internal Medicine

## 2011-08-22 VITALS — BP 122/59 | HR 92 | Temp 98.9°F | Ht 64.0 in | Wt 133.9 lb

## 2011-08-22 DIAGNOSIS — R0600 Dyspnea, unspecified: Secondary | ICD-10-CM

## 2011-08-22 DIAGNOSIS — E119 Type 2 diabetes mellitus without complications: Secondary | ICD-10-CM

## 2011-08-22 DIAGNOSIS — R0602 Shortness of breath: Secondary | ICD-10-CM

## 2011-08-22 DIAGNOSIS — K219 Gastro-esophageal reflux disease without esophagitis: Secondary | ICD-10-CM | POA: Diagnosis present

## 2011-08-22 DIAGNOSIS — R079 Chest pain, unspecified: Secondary | ICD-10-CM

## 2011-08-22 DIAGNOSIS — I209 Angina pectoris, unspecified: Principal | ICD-10-CM | POA: Diagnosis present

## 2011-08-22 DIAGNOSIS — I10 Essential (primary) hypertension: Secondary | ICD-10-CM | POA: Diagnosis present

## 2011-08-22 DIAGNOSIS — J4489 Other specified chronic obstructive pulmonary disease: Secondary | ICD-10-CM | POA: Diagnosis present

## 2011-08-22 DIAGNOSIS — R0789 Other chest pain: Secondary | ICD-10-CM

## 2011-08-22 DIAGNOSIS — R0609 Other forms of dyspnea: Secondary | ICD-10-CM

## 2011-08-22 DIAGNOSIS — J449 Chronic obstructive pulmonary disease, unspecified: Secondary | ICD-10-CM | POA: Diagnosis present

## 2011-08-22 DIAGNOSIS — E785 Hyperlipidemia, unspecified: Secondary | ICD-10-CM | POA: Diagnosis present

## 2011-08-22 DIAGNOSIS — I2510 Atherosclerotic heart disease of native coronary artery without angina pectoris: Secondary | ICD-10-CM | POA: Diagnosis present

## 2011-08-22 LAB — COMPREHENSIVE METABOLIC PANEL
ALT: 21 U/L (ref 0–35)
Alkaline Phosphatase: 133 U/L — ABNORMAL HIGH (ref 39–117)
BUN: 21 mg/dL (ref 6–23)
Chloride: 103 mEq/L (ref 96–112)
GFR calc Af Amer: 53 mL/min — ABNORMAL LOW (ref >90)
Glucose, Bld: 86 mg/dL (ref 70–99)
Potassium: 3.6 mEq/L (ref 3.5–5.1)
Sodium: 140 mEq/L (ref 135–145)
Total Bilirubin: 0.4 mg/dL (ref 0.3–1.2)
Total Protein: 7.3 g/dL (ref 6.0–8.3)

## 2011-08-22 LAB — GLUCOSE, CAPILLARY: Glucose-Capillary: 70 mg/dL (ref 70–99)

## 2011-08-22 LAB — DIFFERENTIAL
Eosinophils Relative: 4 % (ref 0–5)
Lymphocytes Relative: 37 % (ref 12–46)
Lymphs Abs: 2.2 10*3/uL (ref 0.7–4.0)
Monocytes Absolute: 0.5 10*3/uL (ref 0.1–1.0)
Neutro Abs: 3 10*3/uL (ref 1.7–7.7)

## 2011-08-22 LAB — CBC
HCT: 33.7 % — ABNORMAL LOW (ref 36.0–46.0)
Hemoglobin: 11.1 g/dL — ABNORMAL LOW (ref 12.0–15.0)
MCV: 85.5 fL (ref 78.0–100.0)
RDW: 14.5 % (ref 11.5–15.5)
WBC: 5.9 10*3/uL (ref 4.0–10.5)

## 2011-08-22 LAB — CARDIAC PANEL(CRET KIN+CKTOT+MB+TROPI)
Relative Index: 1.7 (ref 0.0–2.5)
Troponin I: <0.3 ng/mL (ref <0.30)

## 2011-08-22 MED ORDER — NITROGLYCERIN 0.4 MG SL SUBL: 0.4000 mg | SUBLINGUAL_TABLET | Freq: Once | SUBLINGUAL | Status: DC

## 2011-08-22 NOTE — Progress Notes (Signed)
Subjective:   [name] female   DOB: 01/24/1934 77 y.o.   MRN: [id number]  HPI: [name] is a 77 y.o.  With PMH at outlined below who presented the clinic with worsening SOB. Patient noted that while resting she is doing fine but if she walks and move around she feel SOB and chest discomfort, breast pain, left sided abdominal pain, neck pain and left shoulder pain. She noted it is getting progressively worse. Since using Advair no significant use of Albuterol inhaler.    Patient was seen  for same symptoms 9/11 and 9/25 : CXRay did not show any acute process. CBC with no significant changes    Past Medical History  Diagnosis Date  . GERD (gastroesophageal reflux disease)   . Hypertension   . Hypothyroidism   . Anemia   . Hyperlipidemia   . Sinus arrhythmia   . History of migraine headaches     Next atypical symptoms in the past. Patient was started on Neurontin for possible neuropathic origin of her pain.  Marland Kitchen COPD (chronic obstructive pulmonary disease)   . Heart murmur, systolic     2-D echo in December 2011 showed a normal EF with grade 1 diastolic dysfunction, trivial pulmonary regurgitation and mildly elevated PA pressure at 37 mmHg probably secondary to her COPD.  . Diabetes mellitus     On oral hypoglycemic agents.  . Hypothyroidism    Current Outpatient Prescriptions  Medication Sig Dispense Refill  . albuterol (PROVENTIL HFA;VENTOLIN HFA) 108 (90 BASE) MCG/ACT inhaler Inhale 2 puffs into the lungs every 6 (six) hours as needed.        Marland Kitchen atorvastatin (LIPITOR) 40 MG tablet Take 1 tablet (40 mg total) by mouth daily.  31 tablet  6  . Fluticasone-Salmeterol (ADVAIR DISKUS) 250-50 MCG/DOSE AEPB Inhale 1 puff into the lungs 2 (two) times daily.  60 each  1  . glimepiride (AMARYL) 1 MG tablet Take 1 tablet (1 mg total) by mouth daily before breakfast.  31 tablet  6  . glucose blood test strip Use as instructed  100 each  6  . hydrochlorothiazide 25 MG  tablet Take 1 tablet (25 mg total) by mouth daily.  30 tablet  6  . imipramine (TOFRANIL) 50 MG tablet Take 1 tablet (50 mg total) by mouth 2 (two) times daily.  60 tablet  3  . Lancets 28G MISC Use as directed to check  your sugars  100 each  6  . levothyroxine (LEVOTHROID) 25 MCG tablet Take 1 tablet (25 mcg total) by mouth daily.  30 tablet  11  . loratadine (CLARITIN) 10 MG tablet Take 1 tablet (10 mg total) by mouth daily.  31 tablet  5  . ondansetron (ZOFRAN) 4 MG tablet Take 1 tablet (4 mg total) by mouth every 8 (eight) hours as needed for nausea.  30 tablet  1  . valsartan (DIOVAN) 160 MG tablet Take 1 tablet (160 mg total) by mouth daily.  30 tablet  11  . valsartan (DIOVAN) 160 MG tablet Take 1.5 tablets (240 mg total) by mouth daily.  60 tablet  1   Review of Systems: Constitutional: Denies fever, chills, diaphoresis, appetite change  HEENT: Denies photophobia, eye pain, redness, hearing loss, ear pain, congestion, sore throat, rhinorrhea, sneezing, mouth sores, trouble swallowing, neck pain, neck stiffness and tinnitus.   Respiratory: Noted SOB and DOE but no cough, chest tightness or wheezing.   Cardiovascular: noted chest pain and palpitations  with exertion.  Gastrointestinal: Denies nausea, vomiting, abdominal pain, diarrhea, constipation, blood in stool and abdominal distention.  Neurological: Denies dizziness, seizures, syncope, weakness, light-headedness, numbness and headaches.   Objective:  Physical Exam: Filed Vitals:   08/22/11 1056  BP: 122/59  Pulse: 92  Temp: 98.9 F (37.2 C)  TempSrc: Oral  Height: 5\' 4"  (1.626 m)  Weight: 133 lb 14.4 oz (60.737 kg)  SpO2: 98%   Constitutional: Vital signs reviewed.  Patient is a well-developed and well-nourished  in no acute distress and cooperative with exam. Alert and oriented x3.  Neck: Supple, Trachea midline normal ROM, No JVD,  Cardiovascular: RRR, S1 normal, S2 normal, no MRG, pulses symmetric and intact  bilaterally Pulmonary/Chest: CTAB, no wheezes, rales, or rhonchi Abdominal: Soft. Non-tender, non-distended, bowel sounds are normal, no masses, organomegaly, or guarding present.  Musculoskeletal: No joint deformities, erythema, or stiffness, ROM full and no nontender Neurological: A&O x3, Strenght is normal and symmetric bilaterally, no focal motor deficit, sensory intact to light touch bilaterally.  Psychiatric: Normal mood and affect.

## 2011-08-22 NOTE — Progress Notes (Deleted)
  Subjective:    [name], female    DOB: 01/24/1934, 77 y.o.   MRN: [id number]  HPI    Review of Systems     Objective:   Physical Exam        Assessment & Plan:

## 2011-08-22 NOTE — Assessment & Plan Note (Addendum)
Concerning for Ischemic event. ECG was obtained which had no sign of ischemia. Unchanged from previous ECG. Patient walked in the clinic and experienced worsening left arm pain and slightly SOB which improved with nitroglycerin within a minute. Will admit patient . Will consult cardiology for possible stress test. Recent CXR did not show any acute process. Highly unlikely due to uncontrolled COPD.

## 2011-08-22 NOTE — Progress Notes (Signed)
Addended by: Almyra Deforest on: 08/22/2011 06:13 PM   Modules accepted: Orders

## 2011-08-22 NOTE — H&P (Signed)
Hospital Admission Note Date: 08/22/2011  Patient name:  Christine [name]   Medical record number:  [id number] Date of birth:  01/24/1934  Age: 77 y.o. Gender:  female PCP:    Bethel Born, MD  Medical Service:   Internal Medicine Teaching Service   Attending physician:  Dr. Cliffton Asters First Contact:   Maxie Better  Pager: [phone]  Second Contact:   Dr. Saralyn Pilar Pager: [phone] After Hours:    First Contact   Pager: [phone]      Second Contact  Pager: [phone]   Chief Complaint: Chest Pain  History of Present Illness: Patient is a 77 y.o. female with a PMHx of COPD and well controlled diabetes mellitus, who presents to [facility] for evaluation of new onset chest pain and worsening shortness of breath.  Patient has been having recurrent episodes of shortness of breath with 3 visits to the Internal Medicine Outpatient Clinic in the past 1 month.  She states that the shortness of breath occurs with exertion, is worsening, and is now accompanied by chest pain for the past 4 days.  The chest pain is located in the center of her chest and is described as a "stinging" and pressure-like pain.  It only occurs with exertion and is relieved within minutes after laying down and resting.  She also complains of pain in her left and right arm, left jaw and underneath her left breast.  This arm and jaw pain also occurs with exertion and is relieved by rest.     She has never experienced pain like this before.  She also complains of nausea that accompanies the chest pain, but no vomiting.  Patient began experiencing the chest pain while in the clinic.  Patient was given Nitro sublingual and the pain was relieved within 1 minute.   Currently patient is chest pain free.   Current Outpatient Medications: Current Outpatient Prescriptions  Medication Sig Dispense Refill  . albuterol (PROVENTIL HFA;VENTOLIN HFA) 108 (90 BASE) MCG/ACT inhaler Inhale 2 puffs into the lungs every  6 (six) hours as needed.        Marland Kitchen atorvastatin (LIPITOR) 40 MG tablet Take 1 tablet (40 mg total) by mouth daily.  31 tablet  6  . Fluticasone-Salmeterol (ADVAIR DISKUS) 250-50 MCG/DOSE AEPB Inhale 1 puff into the lungs 2 (two) times daily.  60 each  1  . glimepiride (AMARYL) 1 MG tablet Take 1 tablet (1 mg total) by mouth daily before breakfast.  31 tablet  6  . glucose blood test strip Use as instructed  100 each  6  . hydrochlorothiazide 25 MG tablet Take 1 tablet (25 mg total) by mouth daily.  30 tablet  6  . imipramine (TOFRANIL) 50 MG tablet Take 1 tablet (50 mg total) by mouth 2 (two) times daily.  60 tablet  3  . Lancets 28G MISC Use as directed to check  your sugars  100 each  6  . levothyroxine (LEVOTHROID) 25 MCG tablet Take 1 tablet (25 mcg total) by mouth daily.  30 tablet  11  . loratadine (CLARITIN) 10 MG tablet Take 1 tablet (10 mg total) by mouth daily.  31 tablet  5  . nitroGLYCERIN (NITROSTAT) 0.4 MG SL tablet Place 1 tablet (0.4 mg total) under the tongue once.  1 tablet  0  . Nexium 40mg  One daily 1 tablet          . valsartan (DIOVAN) 160 MG tablet Take 1.5 tablets (240 mg total)  by mouth daily.  60 tablet  1    Allergies: Allergies  Allergen Reactions  . Aspirin     REACTION: Angioedema  . Codeine     REACTION: Vomiting     Past Medical History: Past Medical History  Diagnosis Date  . GERD (gastroesophageal reflux disease)   . Hypertension  2-D echo in December 2011 showed a normal EF with grade 1 diastolic dysfunction, trivial pulmonary regurgitation and mildly elevated PA pressure at 37 mmHg probably secondary to her COPD.      Marland Kitchen Hypothyroidism- TSH in January 2012 of 1.27   . Microcytic Anemia- baseline Hg 11-12   . Hyperlipidemia-  LDL of 82 in May 2012   .    Marland Kitchen History of migraine headaches     Next atypical symptoms in the past. Patient was started on Neurontin for possible neuropathic origin of her pain.  Marland Kitchen COPD (chronic obstructive pulmonary  disease)- not on home O2   .        Marland Kitchen Diabetes mellitus- HbA1C 6.6% in Sept 2012     On oral hypoglycemic agents.  .      Past Surgical History: Past Surgical History  Procedure Date  . Abdominal hysterectomy      total abdominal hysterectomy in 1976 for reasons unknown    Family History: Family History  Problem Relation Age of Onset  . Cancer Mother 57     cancer of the colon  . COPD Father 36    Social History: History   Social History  . Marital Status: Widowed    Spouse Name: N/A    Number of Children: N/A  . Years of Education: N/A   Occupational History  . Not on file.   Social History Main Topics  . Smoking status: Never Smoker   . Smokeless tobacco: Not on file  . Alcohol Use: No  . Drug Use: No  . Sexually Active: No   Other Topics Concern  . Not on file   Social History Narrative    Never smoked. Care for her daughter who has had a lung transplant. 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  HEENT: Denies photophobia, eye pain, redness, hearing loss, ear pain, congestion, sore throat, rhinorrhea, sneezing, mouth sores, trouble swallowing, neck pain, neck stiffness and tinnitus.  Respiratory: Denies cough and wheezing.  Cardiovascular: Denies palpitations and leg swelling.  Gastrointestinal: Denies vomiting, abdominal pain, diarrhea, constipation, blood in stool and abdominal distention.  Genitourinary: Denies dysuria, urgency, frequency, hematuria, flank pain and difficulty urinating.  Musculoskeletal: Denies myalgias, back pain, joint swelling, arthralgias and gait problem.   Skin: Denies pallor, rash and wound.  Neurological: Denies dizziness, seizures, syncope, weakness, light-headedness, numbness and headaches.   Hematological: Denies adenopathy. Easy bruising, personal or family bleeding history.    Psychiatric/ Behavioral: Denies suicidal ideation, mood changes, confusion, nervousness, sleep disturbance and agitation.    Vital Signs: T: 98.8 P: 92 BP: 122/59 RR: 12 O2 sat: 98% RA   Physical Exam: General: Vital signs reviewed and noted. Well-developed, well-nourished, in no acute distress; alert, appropriate and cooperative throughout examination.  Head: Normocephalic, atraumatic.  Eyes: PERRL, EOMI, No signs of anemia or jaundince.  Nose: Mucous membranes moist, not inflammed, nonerythematous.  Throat: Oropharynx nonerythematous, no exudate appreciated.   Neck: No deformities, masses, or tenderness noted.Supple, No carotid Bruits, no  JVD.  Lungs:  Normal respiratory effort. Clear to auscultation BL without crackles or wheezes.  Heart: RRR. S1 and S2 normal without gallop, murmur, or rubs.   Abdomen:  BS normoactive. Soft, Nondistended, non-tender.  No masses or organomegaly.  Extremities: No pretibial edema.  Neurologic: A&O X3, CN II - XII are grossly intact. Motor strength is 5/5 in the all 4 extremities, Sensations intact to light touch, Cerebellar signs negative.  Skin: No visible rashes, scars.     Recent Labs  [facility] 08/22/11 1105   GLUCAP 159*    Other results: EKG: Normal sinus rhythm. No ST or T wave changes.     Assessment & Plan: 77 year old female with new onset chest pain and worsening shortness of breath.  1.  Chest Pain, unknown etiology:  Concerning for Acute Coronary Syndrome given typical features of chest pain, including pain that is brought on by exertion, worsening, and relieved by rest.  EKG rules out STEMI.  Will admit to floor with cardiac enzymes to be drawn x3 and placed on telemetry.  Patient not given Aspirin due to angioedema reaction in the past.    TIMI risk score of 3 places the patient at higher risk of ACS, cardiology will be consulted.   Will give Nitroglycerin sublingual for chest pain.   Pain is unlikely to be due to underlying  GERD, as pain is not associated with food and pain is relieved with Nitro.  Pain is not likely to be due to COPD since patient is not wheezing.     2.  Shortness of breath, worsening.  Will rule out ACS with plans as above.  May also be secondary to known COPD. Patient will receive home regime of Albuterol and Advair BID.   3.  Diabetes Mellitus:  Hemoglobin A1C 6.6% in September 2012.  Will D/C oral hypoglycemic while inpatient and place on insulin sliding scale.   4.Hypothyroidism:  TSH 1.2 in January 2012. Will continue home regime of Levothroid.    5.  Hyperlipidemia: LDL of 82 in May 2012.  Will check fasting lipid profile.    6.  DVT PPX -Lovenox     Bethel Born, M.D. (PGY3):  ____________________________________    Date/ Time:     ____________________________________        I have seen and examined the patient. I reviewed the resident/fellow note and agree with the findings and plan of care as documented. My additions and revisions are included.   Signature:  ____________________________________________     Internal Medicine Teaching Service Attending    Date:    ____________________________________________

## 2011-08-22 NOTE — Progress Notes (Signed)
#  22 NSL done left forearm by Dorie Rank. Stanton Kidney Berdie Malter RN 08/22/11 2:30PM

## 2011-08-22 NOTE — Progress Notes (Signed)
12:05PM (1) NTG 0.4mg  sublig given - discomfort left arm better 12:07PM per Dr Loistine Chance. Stanton Kidney Crickett Abbett RN 08/22/11 12:30PM PSO2 done before NTG at 12N per Dr Loistine Chance - PSO2 with walking 97-98% and pulse 91-93. Stanton Kidney Henderson Frampton RN 08/22/11 12:35PM

## 2011-08-23 ENCOUNTER — Ambulatory Visit: Payer: Medicare Other | Admitting: Internal Medicine

## 2011-08-23 DIAGNOSIS — I517 Cardiomegaly: Secondary | ICD-10-CM

## 2011-08-23 DIAGNOSIS — I2510 Atherosclerotic heart disease of native coronary artery without angina pectoris: Secondary | ICD-10-CM

## 2011-08-23 LAB — CARDIAC PANEL(CRET KIN+CKTOT+MB+TROPI)
CK, MB: 3.2 ng/mL (ref 0.3–4.0)
CK, MB: 3.4 ng/mL (ref 0.3–4.0)
Relative Index: 2 (ref 0.0–2.5)
Total CK: 177 U/L (ref 7–177)
Total CK: 169 U/L (ref 7–177)
Total CK: 169 U/L (ref 7–177)
Troponin I: <0.3 ng/mL (ref <0.30)

## 2011-08-23 LAB — LIPID PANEL
Cholesterol: 159 mg/dL (ref 0–200)
LDL Cholesterol: 79 mg/dL (ref 0–99)
VLDL: 36 mg/dL (ref 0–40)

## 2011-08-23 LAB — PROTIME-INR: INR: 0.97 (ref 0.00–1.49)

## 2011-08-23 LAB — GLUCOSE, CAPILLARY
Glucose-Capillary: 95 mg/dL (ref 70–99)
Glucose-Capillary: 92 mg/dL (ref 70–99)

## 2011-08-23 NOTE — Progress Notes (Signed)
Pt taken to Room 3711 via w/c with belonging and family at 3:45PM. Stanton Kidney Dorrie Cocuzza RN 08/23/11 9:30AM

## 2011-08-24 LAB — BASIC METABOLIC PANEL
BUN: 15 mg/dL (ref 6–23)
Calcium: 9.5 mg/dL (ref 8.4–10.5)
Creatinine, Ser: 1.06 mg/dL (ref 0.50–1.10)
GFR calc non Af Amer: 50 mL/min — ABNORMAL LOW (ref >90)
Glucose, Bld: 99 mg/dL (ref 70–99)

## 2011-08-24 LAB — CBC
HCT: 35.3 % — ABNORMAL LOW (ref 36.0–46.0)
Hemoglobin: 11.6 g/dL — ABNORMAL LOW (ref 12.0–15.0)
MCH: 28.5 pg (ref 26.0–34.0)
MCHC: 32.9 g/dL (ref 30.0–36.0)
MCV: 86.7 fL (ref 78.0–100.0)

## 2011-08-28 ENCOUNTER — Ambulatory Visit: Payer: Medicare Other | Admitting: Cardiology

## 2011-08-29 ENCOUNTER — Ambulatory Visit: Payer: Medicare Other | Admitting: Internal Medicine

## 2011-08-29 ENCOUNTER — Encounter: Payer: Self-pay | Admitting: Internal Medicine

## 2011-08-29 ENCOUNTER — Ambulatory Visit (INDEPENDENT_AMBULATORY_CARE_PROVIDER_SITE_OTHER): Payer: Medicare Other | Admitting: Internal Medicine

## 2011-08-29 DIAGNOSIS — E119 Type 2 diabetes mellitus without complications: Secondary | ICD-10-CM

## 2011-08-29 DIAGNOSIS — I209 Angina pectoris, unspecified: Secondary | ICD-10-CM

## 2011-08-29 DIAGNOSIS — I2510 Atherosclerotic heart disease of native coronary artery without angina pectoris: Secondary | ICD-10-CM

## 2011-08-29 DIAGNOSIS — I10 Essential (primary) hypertension: Secondary | ICD-10-CM

## 2011-08-29 DIAGNOSIS — E785 Hyperlipidemia, unspecified: Secondary | ICD-10-CM

## 2011-08-29 NOTE — Progress Notes (Signed)
Subjective:   [name] female   DOB: 01/24/1934 77 y.o.   MRN: [id number]  HPI: [name] is a 77 y.o. woman who presents to clinic today for follow up from her hospitalization.  She was hospitalized for angina after presenting to clinic with exertional chest pain.  She underwent catheterization by Dr. Daleen [name] which showed non-obstructive CAD.  She was started on Plavix, Imdur, and Metoprolol.  She states that since she was discharged she has only had one episode of chest pain that was not bad enough to cause her to take a nitroglycerin tablet.  She states that otherwise she is doing well.  According to her discharge pink sheet she is supposed to see Dr. Shirlee [name] at [name] in 2-3 weeks after discharge.  She will need a referral to get that scheduled.    She has a PMH significant for Diabetes, hyperlipidemia, and hypertension as well and states that she has been taking her medication as directed by her PCP.    Past Medical History  Diagnosis Date  . GERD (gastroesophageal reflux disease)   . Hypertension   . Hypothyroidism   . Anemia   . Hyperlipidemia   . Sinus arrhythmia   . History of migraine headaches     Next atypical symptoms in the past. Patient was started on Neurontin for possible neuropathic origin of her pain.  Marland Kitchen COPD (chronic obstructive pulmonary disease)   . Heart murmur, systolic     2-D echo in December 2011 showed a normal EF with grade 1 diastolic dysfunction, trivial pulmonary regurgitation and mildly elevated PA pressure at 37 mmHg probably secondary to her COPD.  . Diabetes mellitus     On oral hypoglycemic agents.  . Hypothyroidism    Current Outpatient Prescriptions  Medication Sig Dispense Refill  . albuterol (PROVENTIL HFA;VENTOLIN HFA) 108 (90 BASE) MCG/ACT inhaler Inhale 2 puffs into the lungs every 6 (six) hours as needed.        Marland Kitchen atorvastatin (LIPITOR) 40 MG tablet Take 1 tablet (40 mg total) by mouth daily.  31 tablet  6  .  Fluticasone-Salmeterol (ADVAIR DISKUS) 250-50 MCG/DOSE AEPB Inhale 1 puff into the lungs 2 (two) times daily.  60 each  1  . glimepiride (AMARYL) 1 MG tablet Take 1 tablet (1 mg total) by mouth daily before breakfast.  31 tablet  6  . glucose blood test strip Use as instructed  100 each  6  . hydrochlorothiazide 25 MG tablet Take 1 tablet (25 mg total) by mouth daily.  30 tablet  6  . imipramine (TOFRANIL) 50 MG tablet Take 1 tablet (50 mg total) by mouth 2 (two) times daily.  60 tablet  3  . Lancets 28G MISC Use as directed to check  your sugars  100 each  6  . levothyroxine (LEVOTHROID) 25 MCG tablet Take 1 tablet (25 mcg total) by mouth daily.  30 tablet  11  . loratadine (CLARITIN) 10 MG tablet Take 1 tablet (10 mg total) by mouth daily.  31 tablet  5  . nitroGLYCERIN (NITROSTAT) 0.4 MG SL tablet Place 1 tablet (0.4 mg total) under the tongue once.  1 tablet  0  . valsartan (DIOVAN) 160 MG tablet Take 1 tablet (160 mg total) by mouth daily.  30 tablet  11  . valsartan (DIOVAN) 160 MG tablet Take 1.5 tablets (240 mg total) by mouth daily.  60 tablet  1   Family History  Problem Relation Age  of Onset  . Cancer Mother 13     cancer of the colon  . COPD Father 26   History   Social History  . Marital Status: Widowed    Spouse Name: N/A    Number of Children: N/A  . Years of Education: N/A   Social History Main Topics  . Smoking status: Never Smoker   . Smokeless tobacco: None  . Alcohol Use: No  . Drug Use: No  . Sexually Active: No   Other Topics Concern  . None   Social History Narrative    Never smoked. 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  HEENT: Denies photophobia, eye pain, redness, hearing loss, ear pain, congestion, sore throat, rhinorrhea, sneezing, mouth sores, trouble  swallowing, neck pain, neck stiffness and tinnitus.   Respiratory: Denies SOB, DOE, cough, chest tightness,  and wheezing.   Cardiovascular: Denies chest pain, palpitations and leg swelling.  Gastrointestinal: Denies nausea, vomiting, abdominal pain, diarrhea, constipation, blood in stool and abdominal distention.  Genitourinary: Denies dysuria, urgency, frequency, hematuria, flank pain and difficulty urinating.  Musculoskeletal: Denies myalgias, back pain, joint swelling, arthralgias and gait problem.  Skin: Denies pallor, rash and wound.  Neurological: Denies dizziness, seizures, syncope, weakness, light-headedness, numbness and headaches.  Hematological: Denies adenopathy. Easy bruising, personal or family bleeding history  Psychiatric/Behavioral: Denies suicidal ideation, mood changes, confusion, nervousness, sleep disturbance and agitation  Objective:  Physical Exam: Filed Vitals:   08/29/11 1055  BP: 113/57  Pulse: 86  Temp: 97.5 F (36.4 C)  TempSrc: Oral  Height: 5\' 4"  (1.626 m)  Weight: 134 lb 9.6 oz (61.054 kg)   Constitutional: Vital signs reviewed.  Patient is a well-developed and well-nourished woman in no acute distress and cooperative with exam. Alert and oriented x3.  Head: Normocephalic and atraumatic Ear: TM normal bilaterally Mouth: no erythema or exudates, MMM Eyes: PERRL, EOMI, conjunctivae normal, No scleral icterus.  Neck: Supple, Trachea midline normal ROM, No JVD, mass, thyromegaly, or carotid bruit present.  Cardiovascular: RRR, S1 normal, S2 normal, no MRG, pulses symmetric and intact bilaterally Pulmonary/Chest: CTAB, no wheezes, rales, or rhonchi Abdominal: Soft. Non-tender, non-distended, bowel sounds are normal, no masses, organomegaly, or guarding present.  GU: no CVA tenderness Musculoskeletal: No joint deformities, erythema, or stiffness, ROM full and no nontender Hematology: no cervical, inginal, or axillary adenopathy.  Neurological: A&O x3,  Strength is normal and symmetric bilaterally, cranial nerve II-XII are grossly intact, no focal motor deficit, sensory intact to light touch bilaterally.  Skin: Warm, dry and intact. No rash, cyanosis, or clubbing.  Psychiatric: Normal mood and affect. speech and behavior is normal. Judgment and thought content normal. Cognition and memory are normal.   Assessment & Plan:

## 2011-08-29 NOTE — Patient Instructions (Addendum)
We will make the referral to Dr. Daleen Squibb, the heart doctor you saw in the hospital.    Continue your medication as prescribed.  Follow up in December with your PCP.

## 2011-09-02 NOTE — Assessment & Plan Note (Signed)
Her chest pain was diagnosed as unstable angina but cath showed non-obstructive CAD.  She will be managed medically.

## 2011-09-02 NOTE — Assessment & Plan Note (Signed)
Lab Results  Component Value Date   CHOL 159 08/23/2011   CHOL 153 04/09/2011   CHOL 145 11/01/2010   Lab Results  Component Value Date   HDL 44 08/23/2011   HDL 46 9/52/8413   HDL 39* 11/01/2010   Lab Results  Component Value Date   LDLCALC 79 08/23/2011   LDLCALC 82 04/09/2011   LDLCALC 66 11/01/2010   Lab Results  Component Value Date   TRIG 179* 08/23/2011   TRIG 123 04/09/2011   TRIG 198* 11/01/2010   Lab Results  Component Value Date   CHOLHDL 3.6 08/23/2011   CHOLHDL 3.3 04/09/2011   CHOLHDL 3.7 Ratio 11/01/2010   No results found for this basename: LDLDIRECT   Her LDL is well below her goal of <100.  We will continue to monitor on a regular basis.

## 2011-09-02 NOTE — Progress Notes (Signed)
Addended by: Neomia Dear on: 09/02/2011 05:58 PM   Modules accepted: Orders

## 2011-09-02 NOTE — Assessment & Plan Note (Signed)
We will get her set up with Dr. Shirlee Latch as an outpatient and continue to treat her medically.

## 2011-09-02 NOTE — Assessment & Plan Note (Signed)
Lab Results  Component Value Date   NA 140 08/24/2011   K 4.1 08/24/2011   CL 104 08/24/2011   CO2 28 08/24/2011   BUN 15 08/24/2011   CREATININE 1.06 08/24/2011   CREATININE 1.36* 02/26/2011    BP Readings from Last 3 Encounters:  08/29/11 113/57  08/22/11 122/59  08/13/11 155/70    Assessment: Hypertension control:  controlled  Progress toward goals:  at goal Barriers to meeting goals:  no barriers identified  Plan: Hypertension treatment:  continue current medications

## 2011-09-02 NOTE — Assessment & Plan Note (Signed)
Lab Results  Component Value Date   HGBA1C 6.6 07/30/2011   HGBA1C 6.7 02/26/2011   HGBA1C 6.7 11/28/2010   Lab Results  Component Value Date   MICROALBUR 0.50 05/07/2010   LDLCALC 79 08/23/2011   CREATININE 1.06 08/24/2011   Her A1C is well controlled and has been for sometime.  We will continue to monitor with her new diagnosis of CAD.

## 2011-09-08 NOTE — Consult Note (Signed)
NAMEMERLYN, BOLLEN                ACCOUNT NO.:  [id number]  MEDICAL RECORD NO.:  [id number]  LOCATION:  3711                         FACILITY:  [facility]  PHYSICIAN:  Jesse Sans. Katria Botts, MD, FACCDATE OF BIRTH:  01/24/1934  DATE OF CONSULTATION: DATE OF DISCHARGE:                                CONSULTATION   I was asked by Cliffton Asters, MD of the Internal Medicine Teaching Service to evaluate Christine [name], a delightful 77 year old lady with exertional shortness of breath and chest discomfort.  HISTORY OF PRESENT ILLNESS:  She is 77 years of age, African American female with past history of COPD, well-controlled diabetes mellitus, who presented to the [facility] with the onset exertional chest tightness and pressure that also goes into her jaw and her arm.  It is relieved with rest.  She has had some dyspnea on exertion and fatigue for several weeks.  She now has had this exertional tightness and pressure for about 4 days.  She has not had nocturnal rest pain.  Her admission EKG shows sinus rhythm, possible left atrial enlargement, RSR prime in V1 and V2, otherwise no ST-segment changes.  She is presently comfortable.  Her outpatient meds are reviewed in the H and P by Internal Medicine Clinic and epic.  I will not repeat them at this time.  ALLERGIES:  She is allergic to ASPIRIN having had angioedema and hives back about 30 years ago.  She has also been told she is allergic to NSAIDS.  She also has an allergy to CODEINE with vomiting.  PAST MEDICAL HISTORY:  Significant for: 1. Type 2 diabetes mellitus.  Her last hemoglobin A1c was 6.6 in     September 2012. 2. COPD, not on home O2.  She was never a smoker, but had indoor wood     burning stove and fireplace. 3. History of migraine headaches. 4. History of hyperlipidemia with an LDL of 82 on May 2012. 5. History of microcytic anemia with a baseline hemoglobin of 11-12. 6. Hypothyroidism.  TSH was normal  in January 2012. 7. Hypertension.  Two-D echocardiogram in December 2011 showed a normal EF with diastolic dysfunction, grade 1, pulmonary artery pressure of 37 mmHg felt to be secondary to her COPD and finally gastroesophageal reflux.  Her past surgical history is remarkable for abdominal hysterectomy in 1976.  FAMILY HISTORY:  No premature coronary artery disease.  There was cancer of the colon in her mother.  Her father had COPD.  SOCIAL HISTORY:  She is widowed.  She lives with her daughter, Christine [name], who is in the room with her.  She is very active taking care of the home.  They have a two-story dwelling here in [place].  She has never smoked.  She does not use alcohol or drugs.  REVIEW OF SYSTEMS:  Totally negative.  She denies any orthopnea, PND, palpitations, syncope, presyncope, abdominal pain, melena or hematochezia, bleeding disorder.  PHYSICAL EXAMINATION:  GENERAL:  Tonight, she is a very pleasant lady, in no acute distress. VITAL SIGNS:  Her blood pressure is 122/59, pulse is 92 and regular, respirations 12, sats 98% on room air, temperature is 98.8. HEENT:  She is  wearing a wick.  PERRLA.  Extraocular movements intact. Facial symmetry is normal.  Dentition is in good shape.  No plates or dentures. NECK:  Supple.  Carotids upstrokes are equal bilaterally.  Soft systolic sound probably emanating from the aortic valve.  There is no JVD. Thyroid is not enlarged.  Trachea is midline. LUNGS:  Clear to auscultation and percussion. HEART:  Nondisplaced PMI.  Normal S1, split S2 that moves normally with inspiration.  She has a systolic murmur with no diastolic component, grade 2. ABDOMEN:  Soft, good bowel sounds.  No midline bruits.  No hepatomegaly. EXTREMITIES:  There is no cyanosis, clubbing, or edema.  Pulses are present. NEUROLOGIC:  Grossly intact. PSYCHIATRIC:  Normal affect. SKIN:  Normal.  ASSESSMENT: 1. New-onset exertional angina, suspect obstructive  coronary artery     disease.  She had a remote cath in the distant past that I cannot     even find on the e-chart.  She was told that she did not have any     significant blockage at that time. 2. Other problems as listed above in her past medical history.  PLAN:  n.p.o. past midnight for cardiac cath tomorrow.  We will try to do without aspirin with the history of angioedema and hives.  I have given her a dose of 600 mg of Plavix tonight and 75 mg per day. Indication, risks, potential benefits have been discussed at length with her and her daughter.  She has agreed to proceed.     Christine [name] C. Christine Squibb, MD, [facility]     TCW/MEDQ  D:  08/22/2011  T:  08/23/2011  Job:  [id number]  Electronically Signed by Christine Castle MD [facility] on 09/08/2011 01:50:48 PM

## 2011-09-11 ENCOUNTER — Ambulatory Visit (INDEPENDENT_AMBULATORY_CARE_PROVIDER_SITE_OTHER): Payer: Medicare Other | Admitting: Physician Assistant

## 2011-09-11 ENCOUNTER — Encounter: Payer: Self-pay | Admitting: Physician Assistant

## 2011-09-11 DIAGNOSIS — I2510 Atherosclerotic heart disease of native coronary artery without angina pectoris: Secondary | ICD-10-CM

## 2011-09-11 DIAGNOSIS — I209 Angina pectoris, unspecified: Secondary | ICD-10-CM

## 2011-09-11 DIAGNOSIS — R0989 Other specified symptoms and signs involving the circulatory and respiratory systems: Secondary | ICD-10-CM

## 2011-09-11 DIAGNOSIS — I10 Essential (primary) hypertension: Secondary | ICD-10-CM

## 2011-09-11 DIAGNOSIS — E785 Hyperlipidemia, unspecified: Secondary | ICD-10-CM

## 2011-09-11 MED ORDER — METOPROLOL SUCCINATE ER 50 MG PO TB24: 50.0000 mg | ORAL_TABLET | Freq: Every day | ORAL | Status: DC

## 2011-09-11 MED ORDER — ISOSORBIDE MONONITRATE ER 60 MG PO TB24: 60.0000 mg | ORAL_TABLET | Freq: Every day | ORAL | Status: DC

## 2011-09-11 MED ORDER — VALSARTAN 160 MG PO TABS: 160.0000 mg | ORAL_TABLET | Freq: Every day | ORAL | Status: DC

## 2011-09-11 NOTE — Cardiovascular Report (Signed)
  [name], [name] NO.:  [id number]  MEDICAL RECORD NO.:  [id number]  LOCATION:  EKG                          FACILITY:  [facility]  PHYSICIAN:  Marca Ancona, MD      DATE OF BIRTH:  01/24/1934  DATE OF PROCEDURE: DATE OF DISCHARGE:  08/22/2011                           CARDIAC CATHETERIZATION   PROCEDURE: 1. Left heart catheterization. 2. Coronary angiography.  INDICATIONS:  This is a 77 year old who presented with symptoms consistent with unstable angina.  She had no prior diagnosis of coronary artery disease.  PROCEDURE NOTE:  After informed consent was obtained, the right groin was sterilely prepped and draped.  Lidocaine 1% was used to locally anesthetize the right groin area.  The right common femoral artery was entered using modified Seldinger technique and a 5-French arterial sheath was placed.  The left coronary artery was engaged using the JL-4 catheter, the right coronary artery was engaged using 3-DRC catheter. Left ventricle had been entered earlier using the JR-4 catheter.  There were no complications.  Left ventriculography was not done as she has just had an echo today showing normal LV systolic function and she has chronic kidney disease.  FINDINGS: 1. Hemodynamics:  Aorta 128/60, LV 122/2. 2. Left ventriculography was not done due to her chronic kidney     disease.  She had an echo today showing normal LV systolic     function. 3. Right coronary artery:  The right coronary artery was a dominant     vessel.  There were mild luminal irregularities. 4. Left main:  The left main had an eccentric distal 40-50% stenosis.     This was imaged in multiple views and did not appear to be more     than 40-50% in any view. 5. Left circumflex system:  There was a moderate ramus and a     relatively small circumflex.  Both the ramus and the circumflex     itself appeared to have about 40% ostial stenoses. 6. LAD system:  The LAD itself had mild  luminal irregularities.  There     was a small moderate first diagonal with a 70% ostial stenosis.  IMPRESSION:  The patient has a 40-50% eccentric distal left main stenosis.  I do not think that this lesion is severe enough to be causing her chest pain.  There is good flow down all vessels.  There is also a 70-75% ostial first diagonal stenosis but this first diagonal was a small to moderate vessel and would not be amenable to percutaneous coronary intervention.  At this point, the plan will be medical management.  I am going to add Toprol-XL and Imdur to her regimen.  I will review the films with colleagues.     Marca Ancona, MD     DM/MEDQ  D:  08/23/2011  T:  08/24/2011  Job:  [id number]  Electronically Signed by Marca Ancona MD on 09/11/2011 08:05:30 PM

## 2011-09-11 NOTE — Assessment & Plan Note (Signed)
She is still having symptoms c/w exertional angina.  They are much better.  Decrease Diovan to 160 mg QD to allow room in BP.  Increase Imdur to 60 mg QD and Toprol XL to 50 mg QD.  Follow up in 2 weeks.  Consider adding amlodipine if necessary.  I advised her to use NTG if she feels she needs it.  She knows to go to the ED if she feels worse.

## 2011-09-11 NOTE — Patient Instructions (Signed)
Your physician recommends that you schedule a follow-up appointment in: 2 weeks Your physician has recommended you make the following change in your medication: DECREASE Diovan to 160 mg 1 tab daily  INCREASE Isosorbide to 60 mg daily and Metoprolol to 50 mg daily Your physician has requested that you have a carotid duplex. This test is an ultrasound of the carotid arteries in your neck. It looks at blood flow through these arteries that supply the brain with blood. Allow one hour for this exam. There are no restrictions or special instructions.

## 2011-09-11 NOTE — Assessment & Plan Note (Signed)
Stable.  Medication adjustments as noted.

## 2011-09-11 NOTE — Progress Notes (Signed)
History of Present Illness: Primary Cardiologist:  Dr. Marca Ancona  Christine [name] is a 77 y.o. female who presents for post hospital follow up.  She was admitted a few weeks ago with chest pain and SOB.  MI was r/o.  She has significant cardiac risk factors (DM2, HLP, HTN, CKD) and was seen by cardiology.  Cardiac cath was recommended.  LHC 08/23/11: dLM 40-50%, oRI 40%, oCFX 40%, oD1 70% (small and not amenable to PCI).  LM lesion did not appear to be flow limiting.  Medical rx was recommended.  Echo 08/23/11: mild LVH, EF 60-65%, grade 1 diast dysfxn, mild BAE, PASP 24.    Hospital Labs (10/12):  Hgb 11.6, K 4.1, Creatinine 1.06, ALT 21, TC 159, TG 179, HDL 44, LDL 79, TSH 2.421.  CXR was unremarkable.  She is still having anginal symptoms.  She notes right arm and neck pain mostly with exertion.  It resolves with rest.  She has occasional chest and left arm pain. She feels some nausea.  No diaphoresis.  No syncope.  Has not tried NTG.  She notes her symptoms are much better when compared to how she felt before going to the hospital.  She denies orthopnea, PND or edema.  No palpitations.  She does hear her heart beat in her left ear.   Past Medical History  Diagnosis Date  . GERD (gastroesophageal reflux disease)   . Hypertension   . Hypothyroidism   . Anemia   . Hyperlipidemia   . Sinus arrhythmia   . History of migraine headaches     Next atypical symptoms in the past. Patient was started on Neurontin for possible neuropathic origin of her pain.  Marland Kitchen COPD (chronic obstructive pulmonary disease)   . Heart murmur, systolic     2-D echo in December 2011 showed a normal EF with grade 1 diastolic dysfunction, trivial pulmonary regurgitation and mildly elevated PA pressure at 37 mmHg probably secondary to her COPD.  . Diabetes mellitus     On oral hypoglycemic agents.  . Hypothyroidism   . CAD (coronary artery disease)     LHC 08/23/11: dLM 40-50%, oRI 40%, oCFX 40%, oD1 70% (small and not  amenable to PCI).  LM lesion did not appear to be flow limiting.  Medical rx was recommended.  Echo 08/23/11: mild LVH, EF 60-65%, grade 1 diast dysfxn, mild BAE, PASP 24.      Current Outpatient Prescriptions  Medication Sig Dispense Refill  . albuterol (PROVENTIL HFA;VENTOLIN HFA) 108 (90 BASE) MCG/ACT inhaler Inhale 2 puffs into the lungs every 6 (six) hours as needed.        Marland Kitchen atorvastatin (LIPITOR) 40 MG tablet Take 1 tablet (40 mg total) by mouth daily.  31 tablet  6  . clopidogrel (PLAVIX) 75 MG tablet Take 75 mg by mouth daily.        . Fluticasone-Salmeterol (ADVAIR DISKUS) 250-50 MCG/DOSE AEPB Inhale 1 puff into the lungs 2 (two) times daily.  60 each  1  . glimepiride (AMARYL) 1 MG tablet Take 1 tablet (1 mg total) by mouth daily before breakfast.  31 tablet  6  . glucose blood test strip Use as instructed  100 each  6  . hydrochlorothiazide 25 MG tablet Take 1 tablet (25 mg total) by mouth daily.  30 tablet  6  . imipramine (TOFRANIL) 50 MG tablet Take 1 tablet (50 mg total) by mouth 2 (two) times daily.  60 tablet  3  . isosorbide  mononitrate (IMDUR) 30 MG 24 hr tablet Take 30 mg by mouth daily.        . Lancets 28G MISC Use as directed to check  your sugars  100 each  6  . levothyroxine (LEVOTHROID) 25 MCG tablet Take 1 tablet (25 mcg total) by mouth daily.  30 tablet  11  . loratadine (CLARITIN) 10 MG tablet Take 1 tablet (10 mg total) by mouth daily.  31 tablet  5  . metoprolol succinate (TOPROL-XL) 25 MG 24 hr tablet Take 25 mg by mouth daily.        . nitroGLYCERIN (NITROSTAT) 0.4 MG SL tablet Place 1 tablet (0.4 mg total) under the tongue once.  1 tablet  0  . valsartan (DIOVAN) 160 MG tablet Take 1.5 tablets (240 mg total) by mouth daily.  60 tablet  1  . valsartan (DIOVAN) 160 MG tablet Take 1 tablet (160 mg total) by mouth daily.  30 tablet  11    Allergies: Allergies  Allergen Reactions  . Aspirin     REACTION: Angioedema  . Codeine     REACTION: Vomiting     Social Hx: non smoker  ROS:  Please see the history of present illness.  All other systems reviewed and negative.   Vital Signs: BP 124/60  Pulse 83  Resp 18  Ht 5\' 4"  (1.626 m)  Wt 135 lb (61.236 kg)  BMI 23.17 kg/m2  LMP 02/10/1966  PHYSICAL EXAM: Well nourished, well developed, in no acute distress HEENT: normal Neck: no JVD Vascular: + left carotid bruit Cardiac:  normal S1, S2; RRR; 2/6 systolic murmur RUSB Lungs:  clear to auscultation bilaterally, no wheezing, rhonchi or rales Abd: soft, nontender, no hepatomegaly Ext: no edema; RFA site without hematoma or bruit Skin: warm and dry Neuro:  CNs 2-12 intact, no focal abnormalities noted Psych: normal affect  EKG:  NSR, HR 83, normal axis, PRWP, RSR prime, NSSTTW changes.  ASSESSMENT AND PLAN:

## 2011-09-11 NOTE — Assessment & Plan Note (Signed)
Check carotid dopplers 

## 2011-09-11 NOTE — Assessment & Plan Note (Signed)
Managed by PCP.  Goal LDL < 70. 

## 2011-09-11 NOTE — Assessment & Plan Note (Signed)
Continue medical Rx.   True ASA allergy.  Continue Plavix and statin.  Medication adjustments as noted.  Follow up in 2 week.

## 2011-09-14 ENCOUNTER — Emergency Department (HOSPITAL_COMMUNITY): Payer: Medicare Other

## 2011-09-14 ENCOUNTER — Emergency Department (HOSPITAL_COMMUNITY)
Admission: EM | Admit: 2011-09-14 | Discharge: 2011-09-14 | Disposition: A | Discharge status: Home / Self Care | Payer: Medicare Other | Attending: Emergency Medicine | Admitting: Emergency Medicine

## 2011-09-14 DIAGNOSIS — J449 Chronic obstructive pulmonary disease, unspecified: Secondary | ICD-10-CM | POA: Insufficient documentation

## 2011-09-14 DIAGNOSIS — E785 Hyperlipidemia, unspecified: Secondary | ICD-10-CM | POA: Insufficient documentation

## 2011-09-14 DIAGNOSIS — I2510 Atherosclerotic heart disease of native coronary artery without angina pectoris: Secondary | ICD-10-CM | POA: Insufficient documentation

## 2011-09-14 DIAGNOSIS — Z79899 Other long term (current) drug therapy: Secondary | ICD-10-CM | POA: Insufficient documentation

## 2011-09-14 DIAGNOSIS — E119 Type 2 diabetes mellitus without complications: Secondary | ICD-10-CM | POA: Insufficient documentation

## 2011-09-14 DIAGNOSIS — I10 Essential (primary) hypertension: Secondary | ICD-10-CM | POA: Insufficient documentation

## 2011-09-14 DIAGNOSIS — R079 Chest pain, unspecified: Secondary | ICD-10-CM | POA: Insufficient documentation

## 2011-09-14 DIAGNOSIS — J4489 Other specified chronic obstructive pulmonary disease: Secondary | ICD-10-CM | POA: Insufficient documentation

## 2011-09-14 LAB — CK TOTAL AND CKMB (NOT AT ARMC)
CK, MB: 2.5 ng/mL (ref 0.3–4.0)
Total CK: 125 U/L (ref 7–177)

## 2011-09-14 LAB — BASIC METABOLIC PANEL
CO2: 29 mEq/L (ref 19–32)
Calcium: 9.9 mg/dL (ref 8.4–10.5)
GFR calc non Af Amer: 51 mL/min — ABNORMAL LOW (ref >90)
Potassium: 3.9 mEq/L (ref 3.5–5.1)
Sodium: 137 mEq/L (ref 135–145)

## 2011-09-14 LAB — DIFFERENTIAL
Basophils Absolute: 0 10*3/uL (ref 0.0–0.1)
Eosinophils Relative: 4 % (ref 0–5)
Lymphocytes Relative: 38 % (ref 12–46)
Lymphs Abs: 2.1 10*3/uL (ref 0.7–4.0)
Neutrophils Relative %: 47 % (ref 43–77)

## 2011-09-14 LAB — POCT I-STAT TROPONIN I

## 2011-09-14 LAB — CBC
HCT: 32.6 % — ABNORMAL LOW (ref 36.0–46.0)
Platelets: 247 10*3/uL (ref 150–400)
RBC: 3.78 MIL/uL — ABNORMAL LOW (ref 3.87–5.11)
RDW: 14.7 % (ref 11.5–15.5)
WBC: 5.4 10*3/uL (ref 4.0–10.5)

## 2011-09-14 LAB — GLUCOSE, CAPILLARY: Glucose-Capillary: 89 mg/dL (ref 70–99)

## 2011-09-16 ENCOUNTER — Other Ambulatory Visit: Payer: Self-pay | Admitting: *Deleted

## 2011-09-16 ENCOUNTER — Telehealth: Payer: Self-pay | Admitting: Cardiology

## 2011-09-16 NOTE — Telephone Encounter (Signed)
Called stating that when she was seen by Lorin Picket was instructed to increase Isosorbide to 60 mg and Metoprolol Succ to 50 mg. She started this on Friday. Friday PM BP dropped to 99/ ;felt dizzy. Saturday took 60 mg of Isosorbide but was very dizzy and having some chest pressure. Went to [facility] ER. Stayed there most of the day. They didn't change any of her meds. In ER BP was 124/ . On Sunday took 60 mg of Isosorbide; BP was 129/. Then about 10:00 BP dropped to 113/ ;felt dizzy, lightheaded and had to lie down. Took Metoprolol 25 mg instead of 50 mg around 12:00. Today BP 129/66; took Isosorbide 30 mg this AM. Feels better; no dizzy or CP. Spoke w/ Lorin Picket who advised for her to stop HCTZ;reduce Isosorbide to 30 mg; but stay on Metoprolol 50 mg. Advised to continue BP monitoring and to call back if BP stays low. Also reminded her of carotid doppler study on 11/1.

## 2011-09-16 NOTE — Discharge Summary (Signed)
Christine [name], Christine [name] NO.:  [id number]  MEDICAL RECORD NO.:  [id number]  LOCATION:  3711                         FACILITY:  [facility]  PHYSICIAN:  Cliffton Asters, M.D.    DATE OF BIRTH:  01/24/1934  DATE OF ADMISSION:  08/22/2011 DATE OF DISCHARGE:  08/24/2011                              DISCHARGE SUMMARY   DISCHARGE DIAGNOSES: 1. Angina - thought to be secondary to lesions of ramus and circumflex with approximately 70% ostial stenosis. Will trial on nitrates. 2. Coronary artery disease, nonobstuctive - Left main had eccentric distal 40-50% stenosis. Left circumflex with 70% ostial stenosis. 3. Dyspnea on exertion - thought secondary to #1. 4. Chronic obstructive pulmonary disease, not on home oxygen. 5. Gastroesophageal reflux disease. 6. Diabetes mellitus, HgA1c 6.6. 7. Hypertension. 8. Hyperlipidemia. 9. Hypothyroidism.  DISCHARGE MEDICATIONS WITH DOSAGE: 1. Plavix 75 mg 1 tablet by mouth once daily. 2. Imdur 30 mg 1 tablet by mouth once daily. 3. Toprol-XL 25 mg 1 tablet by mouth once daily. 4. Acetaminophen 500 mg 2 tablets by mouth every 4 hours as needed for     headache or for pain. 5. Advair inhaler 250/50 one puff twice daily. 6. Diovan 160 mg 1-1/2 tablet by mouth once daily. 7. Amaryl 1 mg 1 tablet by mouth once daily. 8. Hydrochlorothiazide 25 mg 1 tablet by mouth once daily. 9. Levothyroxine 25 mcg 1 tablet by mouth once daily. 10.Lipitor 40 mg 1 tablet by mouth once daily. 11.Nexium 40 mg 1 capsule by mouth once daily. 12.Imipramine 50 mg 1 tablet by mouth twice daily.  DISPOSITION AND FOLLOWUP:  The patient will follow up at [facility].  PCP, Dr. Scot Dock.  The patient will follow up in 1-2 weeks.  Internal Medicine Clinic will contact the patient next week with an appointment date.  PROCEDURES PERFORMED: 1. EKG showing normal sinus rhythm, premature atrial complexes, no ST     or T-wave changes. 2. Echocardiogram  revealing normal left ventricular size and systolic     function, ejection fraction of 60-65%, mild left ventricular     hypertrophy, normal right ventricular size and systolic function.     No significant valvular dysfunction.  Mild biatrial enlargement. 3. Cardiac catheterization findings as follows;     a.     Hemodynamic aorta 128/60, left ventricular 122/2,      ventriculography was not done due to the patient's creatinine      function which was elevated.  She did have an echo on the same day      as the cath showing normal left ventricular systolic function.     b.     Right coronary artery was dominant vessel.  There are mild      luminal irregularities.     c.     Left main had eccentric distal 40-50% stenosis.  This was      imaged in multiple views and did not appear to be more than 40-50%      any view.     d.     Left circumflex system.  There was a moderate ramus and      relatively small  circumflex.  Both the ramus and circumflex felt      appeared to have about 70% ostial stenosis.     e.     Left anterior descending system.  The LAD itself had mild      global irregularities, there was a small moderate first diagonal      in 70% ostial stenosis.  CONSULTATIONS:  Cardiology, Dr. Valera Castle and Dr. Marca Ancona from [facility].  ADMITTING HISTORY AND PHYSICAL:  The patient is a 77 year old female with a past medical history significant for COPD and well controlled diabetes mellitus, who presents to [facility] for evaluation of new onset chest pain and worsening shortness of breath.  The patient states she has been having current episodes of shortness of breath with third three visit to the Internal Medicine Outpatient Clinic in the past 1 month.  She states that the shortness of breath occurs with exertion and is worsening, is now accompanied by chest pain for the past 4 days, the chest pain is located in the center upper  chest and described as a stinging and pressure-like pain.  It only occurs with exertion and it is relieved within minutes after laying down and resting.  She also complains of pain in her left and right arm, left jaw, and underneath her left breast cyst.  This arm and jaw pain also occurs with exertion and is relieved by rest.  She had never experienced pain like this before.  She also complains of nausea that accompanied the chest pain but no vomiting.  The patient began experiencing the chest pain while in the clinic.  The patient was given nitro sublingual and the pain was relieved within 1 minute.  Currently, the patient is chest pain free.  She was admitted to the floor from the Internal Medicine Outpatient Clinic.  ADMITTING PHYSICAL EXAMINATION:  VITAL SIGNS:  On admission, temperature 98.8 degrees Fahrenheit, pulse of 92, blood pressure 122/59, respiratory rate of 12, and oxygen saturation 98% on room air. GENERAL:  The patient was a well developed, well nourished, in no acute distress.  She was alert, appropriate, cooperative throughout the examination. HEAD:  Normocephalic and atraumatic. EYES:  Pupils equal, round, and reactive to light.  Extraocular muscle movements intact.  No signs of anemia or jaundice. NOSE:  Mucous membranes moist, noninflamed, nonerythematous. THROAT:  Oropharynx nonerythematous.  No exudates appreciated. NECK:  No deformities, masses or tenderness.  No carotid bruits.  No JVD. LUNGS:  Normal respiratory effort.  Clear to auscultation bilaterally. No crackles or wheezes. HEART:  Regular rate and rhythm.  S1 and S2 normal. ABDOMEN:  Bowel sounds normal, soft, nontender, nondistended.  No masses or organomegaly. EXTREMITIES:  No edema noted. NEUROLOGIC:  Alert and oriented x3.  Cranial nerves II-XII grossly intact. SKIN:  No visible rashes or scars.  ADMITTING LABS:  CBC showed white blood cell count 5.9, hemoglobin 11.1, hematocrit 33.7, platelets  299.  BMP, sodium 148, potassium 3.6, chloride 104, CO2 of 29, BUN 21, creatinine 1.15, glucose 86, total bilirubin 0.4, alk phos 133, AST 20, ALT 21, albumin 4.0, calcium 9.7, magnesium 2.1.  Lipid profile, cholesterol was 159, triglycerides 179, HDL 44, and LDL of 79.  Cardiac enzymes negative x3.  HOSPITAL COURSE: 1. Chest pain and shortness of breath secondary to angina.  - thought to be secondary to nonobnstructive coronary lesions noted by cardiac catheterization. Ruled out for ACS with cardiac enzymes were negative x3.  No EKG changes  indicating an acute  coronary event at that the time of admission.  Cardiology was consulted given that the typical features of her chest pain including pain that is evoked by exertion and pain that is relieved by rest.  Her TIMI score was calculated as 3, placing the  patient at higher risk of an acute coronary event.  Cardiology planned cardiac catheterization to be performed the day after admission on August 23, 2011. The patient was given a loading dose of 600 mg of Plavix prior to procedure and then continued on  Plavix 75 mg daily. The results of the cardiac cath are as above.  Cardiology feels that with 40-50% stenosis of the left main is not significant enough to be the source of her chest pain.  However, she also has a 70% ostial lesion of the first diagonal  branch that was not     amendable to intervention.  Therefore, the patient will be treated     with medical therapy.  Toprol 25 mg once daily and Imdur 30 mg once     daily was started as medical management.  She was also instructed     to continue Plavix 75 mg secondary to her coronary artery disease     and the fact that she could not be placed on aspirin therapy given     her angioedema reaction in the past.  The patient will follow up     outpatient, she is chest pain free at discharge. 2. Hypertension not well controlled, Toprol was added to the patient's     medication regimen as described  above.  This should also help     control her blood pressures.  She will follow up with her blood     pressure readings in the outpatient clinic. 3. COPD, stable.  She is not requiring home O2.  She is satting 100%     on room air while inpatient.  She will maintain albuterol and     Advair inhalers twice daily. 4. GERD, asymptomatic.  This was treated with Protonix inpatient.  She     was completely asymptomatic while inpatient.  She will continue her     home regimen of Nexium on an outpatient basis. 5. Diabetes mellitus type 2, well controlled with a hemoglobin A1c of     6.6% in September 2012, insulin sliding scale coverage while     inpatient.  She will be discharged at home dose of oral     hypoglycemic which is Amaryl 1 mg tablet. 6. Hyperlipidemia, LDL of 79 checked while inpatient.  She will     continue statin therapy. 7. Hypothyroidism, well controlled with medical therapy.  TSH 2.42.     She will continue Levothroid as per her home medication doses.  DISCHARGE VITALS:  Temperature 97.9, blood pressure 165/61, pulse of 78, respiratory rate 16, and O2 sat 100% on room air.  DISCHARGE LABS:  White blood cell count 6.6, hemoglobin 11.6, hematocrit 35.3, platelets 294.  Sodium 140, potassium 4.1, chloride 104, CO2 of 28, BUN 15, creatinine 1.06, and glucose 99.    ______________________________ Johnette Abraham, DO   ______________________________ Cliffton Asters, M.D.    SK/MEDQ  D:  08/24/2011  T:  08/24/2011  Job:  [id number]  cc:   Jesse Sans. Daleen Squibb, MD, [facility] Marca Ancona, MD  Electronically Signed by Johnette Abraham DO on 09/13/2011 10:45:34 PM Electronically Signed by Cliffton Asters M.D. on 09/16/2011 03:19:29 PM

## 2011-09-16 NOTE — Telephone Encounter (Signed)
Pt calling wanting to know if pt needs to increase dosage of pt heart medicine. Pt said her BP is dropping and wants to know if it might be a result of pt heart medicine. Please return pt call to discuss further.

## 2011-09-17 MED ORDER — FLUTICASONE-SALMETEROL 250-50 MCG/DOSE IN AEPB: 1.0000 | INHALATION_SPRAY | Freq: Two times a day (BID) | RESPIRATORY_TRACT | Status: DC

## 2011-09-19 ENCOUNTER — Encounter (INDEPENDENT_AMBULATORY_CARE_PROVIDER_SITE_OTHER): Payer: Medicare Other | Admitting: *Deleted

## 2011-09-19 DIAGNOSIS — R0989 Other specified symptoms and signs involving the circulatory and respiratory systems: Secondary | ICD-10-CM

## 2011-09-19 DIAGNOSIS — I6529 Occlusion and stenosis of unspecified carotid artery: Secondary | ICD-10-CM

## 2011-09-20 ENCOUNTER — Encounter: Payer: Self-pay | Admitting: Physician Assistant

## 2011-09-25 ENCOUNTER — Ambulatory Visit (INDEPENDENT_AMBULATORY_CARE_PROVIDER_SITE_OTHER): Payer: Medicare Other | Admitting: Physician Assistant

## 2011-09-25 ENCOUNTER — Encounter: Payer: Self-pay | Admitting: Physician Assistant

## 2011-09-25 DIAGNOSIS — I10 Essential (primary) hypertension: Secondary | ICD-10-CM

## 2011-09-25 DIAGNOSIS — I2510 Atherosclerotic heart disease of native coronary artery without angina pectoris: Secondary | ICD-10-CM

## 2011-09-25 DIAGNOSIS — I209 Angina pectoris, unspecified: Secondary | ICD-10-CM

## 2011-09-25 DIAGNOSIS — I6529 Occlusion and stenosis of unspecified carotid artery: Secondary | ICD-10-CM

## 2011-09-25 MED ORDER — METOPROLOL SUCCINATE ER 50 MG PO TB24: ORAL_TABLET | ORAL | Status: DC

## 2011-09-25 NOTE — Assessment & Plan Note (Signed)
0-39% bilateral by recent dopplers.  Repeat in one year.

## 2011-09-25 NOTE — Progress Notes (Signed)
History of Present Illness: Primary Cardiologist:  Dr. Marca Ancona  Christine [name] is a 77 y.o. female who presents for follow up.  She was admitted 08/2011 with chest pain and SOB.  MI was r/o.  She has significant cardiac risk factors (DM2, HLP, HTN, CKD) and was seen by cardiology.  LHC 08/23/11: dLM 40-50%, oRI 40%, oCFX 40%, oD1 70% (small and not amenable to PCI).  LM lesion did not appear to be flow limiting.  Medical rx was recommended.  Echo 08/23/11: mild LVH, EF 60-65%, grade 1 diast dysfxn, mild BAE, PASP 24.    I saw her 10/24 in follow up and she was still having exertional arm and neck pain which was thought to be her anginal equivalent.  She also noted some chest pain.  In increased her Toprol and Imdur.  She went to the ED on 10/27 (Labs: Hgb 11.0, Tn neg x 2, K 3.9, creatinine 1.05, CXR ok).  She called in to the office and noted low BPs with symptoms of lightheadedness.  I had her d/c her HCTZ and decrease Imdur back to 30 mg QD.    Carotid dopplers 08/2011: 0-39% bilateral ICA stenosis.    She is doing better.  She denies any exertional arm and neck pain since we adjusted her meds.  She is not lightheaded anymore.  BP is actually higher now in the AM.  Notes systolic in 170s sometimes (before meds).  No chest pain.  Breathing better.  Notes Class 2 symptoms.  No syncope.  No orthopnea, PND or edema.  Takes meds at separate times of the day.    Past Medical History  Diagnosis Date  . GERD (gastroesophageal reflux disease)   . Hypertension   . Hypothyroidism   . Anemia   . Hyperlipidemia   . Sinus arrhythmia   . History of migraine headaches     Next atypical symptoms in the past. Patient was started on Neurontin for possible neuropathic origin of her pain.  Marland Kitchen COPD (chronic obstructive pulmonary disease)   . Heart murmur, systolic     2-D echo in December 2011 showed a normal EF with grade 1 diastolic dysfunction, trivial pulmonary regurgitation and mildly elevated PA  pressure at 37 mmHg probably secondary to her COPD.  . Diabetes mellitus     On oral hypoglycemic agents.  . Hypothyroidism   . CAD (coronary artery disease)     LHC 08/23/11: dLM 40-50%, oRI 40%, oCFX 40%, oD1 70% (small and not amenable to PCI).  LM lesion did not appear to be flow limiting.  Medical rx was recommended.  Echo 08/23/11: mild LVH, EF 60-65%, grade 1 diast dysfxn, mild BAE, PASP 24.    . Carotid stenosis     dopplers 10/12:  0-39% bilat ICA (repeat 08/2012)    Current Outpatient Prescriptions  Medication Sig Dispense Refill  . albuterol (PROVENTIL HFA;VENTOLIN HFA) 108 (90 BASE) MCG/ACT inhaler Inhale 2 puffs into the lungs every 6 (six) hours as needed.        Marland Kitchen atorvastatin (LIPITOR) 40 MG tablet Take 1 tablet (40 mg total) by mouth daily.  31 tablet  6  . clopidogrel (PLAVIX) 75 MG tablet Take 75 mg by mouth daily.        . Fluticasone-Salmeterol (ADVAIR DISKUS) 250-50 MCG/DOSE AEPB Inhale 1 puff into the lungs 2 (two) times daily.  60 each  6  . glimepiride (AMARYL) 1 MG tablet Take 1 tablet (1 mg total) by mouth daily  before breakfast.  31 tablet  6  . glucose blood test strip Use as instructed  100 each  6  . imipramine (TOFRANIL) 50 MG tablet Take 1 tablet (50 mg total) by mouth 2 (two) times daily.  60 tablet  3  . isosorbide mononitrate (IMDUR) 60 MG 24 hr tablet Take 1 tablet (60 mg total) by mouth daily.  30 tablet  3  . Lancets 28G MISC Use as directed to check  your sugars  100 each  6  . levothyroxine (LEVOTHROID) 25 MCG tablet Take 1 tablet (25 mcg total) by mouth daily.  30 tablet  11  . loratadine (CLARITIN) 10 MG tablet Take 1 tablet (10 mg total) by mouth daily.  31 tablet  5  . metoprolol succinate (TOPROL-XL) 50 MG 24 hr tablet Take 1 tablet (50 mg total) by mouth daily.  30 tablet  3  . nitroGLYCERIN (NITROSTAT) 0.4 MG SL tablet Place 1 tablet (0.4 mg total) under the tongue once.  1 tablet  0  . valsartan (DIOVAN) 160 MG tablet Take 1 tablet (160 mg  total) by mouth daily.  30 tablet  3    Allergies: Allergies  Allergen Reactions  . Aspirin     REACTION: Angioedema  . Codeine     REACTION: Vomiting    History  Substance Use Topics  . Smoking status: Never Smoker   . Smokeless tobacco: Not on file  . Alcohol Use: No     Vital Signs: BP 136/60  Pulse 75  Ht 5\' 4"  (1.626 m)  Wt 135 lb 6.4 oz (61.417 kg)  BMI 23.24 kg/m2  LMP 02/10/1966  PHYSICAL EXAM: Well nourished, well developed, in no acute distress HEENT: normal Neck: no JVD Cardiac:  normal S1, S2; RRR; 2/6 systolic murmur RUSB Lungs:  clear to auscultation bilaterally, no wheezing, rhonchi or rales Abd: soft, nontender, no hepatomegaly Ext: no edema Skin: warm and dry Neuro:  CNs 2-12 intact, no focal abnormalities noted Psych: normal affect  EKG:  NSR, HR 75, normal axis, PRWP, RSR prime, NSSTTW changes, no significant changes  ASSESSMENT AND PLAN:

## 2011-09-25 NOTE — Patient Instructions (Addendum)
Increase Toprol XL (metoprolol succinate) to 1 and 1/2 tablets (75 mg) once daily. You can take three of the 25 mg to equal 75 mg (if you have those tablets). I have sent a new prescription to your pharmacy. Continue to check your blood pressures.  Call us in a couple weeks if your blood pressure is still more than 140 on the top number. You can take the Toprol (metoprolol) and Isosorbide in the morning and the Diovan in the afternoon if that is easier.

## 2011-09-25 NOTE — Assessment & Plan Note (Signed)
Much better.  BP needs to be controlled better now since meds adjusted.  Increase toprol to 75 mg QD.  Follow up in one month.

## 2011-09-25 NOTE — Assessment & Plan Note (Signed)
Continue Plavix and statin.  Follow up in one month.

## 2011-10-02 ENCOUNTER — Telehealth: Payer: Self-pay | Admitting: Physician Assistant

## 2011-10-02 NOTE — Telephone Encounter (Signed)
Patient called because she said was seen by Tereso Newcomer PA on 09/25/11 and was advised to call in 2 weeks if her B/P CONTINUES TO BE OVER 140 SYSTOLIC. Patient's B/P is 199/77 to 174/65 in the AM prior taken B/P medications. This morning B/P is 164/72 prior taken Medication and 122/54 2 to 3 hours after taken Imdur 60 mg po. Scott Weave PA aware he recommends for patient to take the  B/P medications in the AM and check B/P 2 to 3 hours after , patient aware she verbalized understanding.

## 2011-10-02 NOTE — Telephone Encounter (Signed)
New message:  Christine [name] last week and was told to call back about her blood pressure.  It is still running high.  Please give her a call and advise.

## 2011-10-18 ENCOUNTER — Ambulatory Visit (INDEPENDENT_AMBULATORY_CARE_PROVIDER_SITE_OTHER): Payer: Medicare Other | Admitting: Internal Medicine

## 2011-10-18 ENCOUNTER — Encounter: Payer: Self-pay | Admitting: Internal Medicine

## 2011-10-18 DIAGNOSIS — I10 Essential (primary) hypertension: Secondary | ICD-10-CM

## 2011-10-18 DIAGNOSIS — E119 Type 2 diabetes mellitus without complications: Secondary | ICD-10-CM

## 2011-10-18 NOTE — Assessment & Plan Note (Signed)
Blood pressure is at goal in the office today. She has had readings that were in the 150 at home early in the morning. Will continue management at this time. No change

## 2011-10-18 NOTE — Patient Instructions (Signed)
Your blood pressure was good today in the office (120/78). We do not need to change the medications.  Follow up in 3-4 months

## 2011-10-18 NOTE — Assessment & Plan Note (Signed)
Current management. No change today.

## 2011-10-18 NOTE — Progress Notes (Signed)
77 year old woman with past medical history of hypothyroidism, diabetes, hypertension, hyperlipidemia, DJD of the back,  Non obs CAD and migraine comes to the clinic for follow up.   No complaints today.  Complaint with medications Sees cardiology as well on regular bases  BP 120/78  Pulse 87  Temp(Src) 97.6 F (36.4 C) (Oral)  Ht 5\' 4"  (1.626 m)  Wt 137 lb 9.6 oz (62.415 kg)  BMI 23.62 kg/m2  LMP 02/10/1966  General Appearance:    Alert, cooperative, no distress, appears stated age  Lungs:     Clear to auscultation bilaterally, respirations unlabored  Chest wall:    No tenderness or deformity  Heart:    Regular rate and rhythm, S1 and S2 normal, no murmur, rub   or gallop  Abdomen:     Soft, non-tender, bowel sounds active all four quadrants,    no masses, no organomegaly  Extremities:   Extremities normal, atraumatic, no cyanosis or edema  Pulses:   2+ and symmetric all extremities  Skin:   Skin color, texture, turgor normal, no rashes or lesions  Lymph nodes:   Cervical, supraclavicular, and axillary nodes normal  Neurologic:   CNII-XII intact. Normal strength, sensation and reflexes      throughout     Constitutional: Denies fever, chills, diaphoresis, appetite change and fatigue.  HEENT: Denies photophobia, eye pain, redness, hearing loss, ear pain, congestion, sore throat, rhinorrhea, sneezing, mouth sores, trouble swallowing, neck pain, neck stiffness and tinnitus.   Respiratory: Denies SOB, DOE, cough, chest tightness,  and wheezing.   Cardiovascular: Denies chest pain, palpitations and leg swelling.  Gastrointestinal: Denies nausea, vomiting, abdominal pain, diarrhea, constipation, blood in stool and abdominal distention.  Genitourinary: Denies dysuria, urgency, frequency, hematuria, flank pain and difficulty urinating.  Musculoskeletal: Denies myalgias, back pain, joint swelling, arthralgias and gait problem.  Skin: Denies pallor, rash and wound.  Neurological: Denies  dizziness, seizures, syncope, weakness, light-headedness, numbness and headaches.  Hematological: Denies adenopathy. Easy bruising, personal or family bleeding history  Psychiatric/Behavioral: Denies suicidal ideation, mood changes, confusion, nervousness, sleep disturbance and agitation

## 2011-10-28 ENCOUNTER — Encounter: Payer: Self-pay | Admitting: Physician Assistant

## 2011-10-28 ENCOUNTER — Ambulatory Visit (INDEPENDENT_AMBULATORY_CARE_PROVIDER_SITE_OTHER): Payer: Medicare Other | Admitting: Physician Assistant

## 2011-10-28 VITALS — BP 130/67 | HR 65 | Ht 64.0 in | Wt 138.0 lb

## 2011-10-28 DIAGNOSIS — I2510 Atherosclerotic heart disease of native coronary artery without angina pectoris: Secondary | ICD-10-CM

## 2011-10-28 DIAGNOSIS — I10 Essential (primary) hypertension: Secondary | ICD-10-CM

## 2011-10-28 DIAGNOSIS — I209 Angina pectoris, unspecified: Secondary | ICD-10-CM

## 2011-10-28 DIAGNOSIS — E785 Hyperlipidemia, unspecified: Secondary | ICD-10-CM

## 2011-10-28 MED ORDER — LISINOPRIL 10 MG PO TABS: 10.0000 mg | ORAL_TABLET | Freq: Every day | ORAL | Status: DC

## 2011-10-28 NOTE — Patient Instructions (Addendum)
Change your medications as follows:  1.  Take Isosorbide and Toprol XL (metoprolol) in the morning. 2.  Take Diovan at bedtime. 3.  Check your BP 2 hours after you take your morning medications to keep track of your blood pressure.   Your physician recommends that you schedule a follow-up appointment in: 3 MONTHS WITH DR. Shirlee Latch THAT SAME DAY GET FASTING LIPID/LIVER PANEL 272.4 HYPERLIPIDEMIA.

## 2011-10-28 NOTE — Assessment & Plan Note (Signed)
Check lipids and LFTs at followup visit with Dr. Marca Ancona.

## 2011-10-28 NOTE — Assessment & Plan Note (Signed)
Continue aspirin, Plavix and statin.  Followup with Dr. Marca Ancona in 3 months.

## 2011-10-28 NOTE — Progress Notes (Signed)
[address] [place], [place]  [address] Phone: [phone] Fax:  [phone]  Date:  10/28/2011   Name:  Christine [name]       DOB:  01/24/1934 MRN:  [id number]  PCP:  Dr. Scot Dock Primary Cardiologist:  Dr. Marca Ancona    History of Present Illness: Christine [name] is a 77 y.o. female who presents for follow up.  She was admitted 08/2011 with chest pain and SOB. MI was r/o. She has significant cardiac risk factors (DM2, HLP, HTN, CKD) and was seen by cardiology. LHC 08/23/11: dLM 40-50%, oRI 40%, oCFX 40%, oD1 70% (small and not amenable to PCI). LM lesion did not appear to be flow limiting. Medical rx was recommended. Echo 08/23/11: mild LVH, EF 60-65%, grade 1 diast dysfxn, mild BAE, PASP 24.  Her medications have been adjusted as an outpatient for treatment of stable angina.  Carotid dopplers 08/2011: 0-39% bilateral ICA stenosis.   Overall, she is doing well.  She denies exertional chest pain or shortness of breath.  She denies exertional arm or neck pain.  She denies orthopnea, PND or edema.  She denies syncope.  She is worried about her blood pressures.  She showed me a listing of her blood pressures that range between 170 and 180 systolically.  They are taken prior to taking her morning medications.  She takes her medications spaced out throughout the day as well.  Her blood pressure recently with her PCP was optimal as was her blood pressure today.   Past Medical History  Diagnosis Date  . GERD (gastroesophageal reflux disease)   . Hypertension   . Hypothyroidism   . Anemia   . Hyperlipidemia   . Sinus arrhythmia   . History of migraine headaches     Next atypical symptoms in the past. Patient was started on Neurontin for possible neuropathic origin of her pain.  Marland Kitchen COPD (chronic obstructive pulmonary disease)   . Heart murmur, systolic     2-D echo in December 2011 showed a normal EF with grade 1 diastolic dysfunction, trivial pulmonary regurgitation  and mildly elevated PA pressure at 37 mmHg probably secondary to her COPD.  . Diabetes mellitus     On oral hypoglycemic agents.  Marland Kitchen CAD (coronary artery disease)     LHC 08/23/11: dLM 40-50%, oRI 40%, oCFX 40%, oD1 70% (small and not amenable to PCI).  LM lesion did not appear to be flow limiting.  Medical rx was recommended.  Echo 08/23/11: mild LVH, EF 60-65%, grade 1 diast dysfxn, mild BAE, PASP 24.    . Carotid stenosis     dopplers 10/12:  0-39% bilat ICA (repeat 08/2012)    Current Outpatient Prescriptions  Medication Sig Dispense Refill  . atorvastatin (LIPITOR) 40 MG tablet Take 1 tablet (40 mg total) by mouth daily.  31 tablet  6  . clopidogrel (PLAVIX) 75 MG tablet Take 75 mg by mouth daily.        Marland Kitchen glimepiride (AMARYL) 1 MG tablet Take 1 tablet (1 mg total) by mouth daily before breakfast.  31 tablet  6  . glucose blood test strip Use as instructed  100 each  6  . imipramine (TOFRANIL) 50 MG tablet Take 1 tablet (50 mg total) by mouth 2 (two) times daily.  60 tablet  3  . isosorbide mononitrate (IMDUR) 60 MG 24 hr tablet Take 1 tablet (60 mg total) by mouth daily.  30 tablet  3  . Lancets  28G MISC Use as directed to check  your sugars  100 each  6  . levothyroxine (LEVOTHROID) 25 MCG tablet Take 1 tablet (25 mcg total) by mouth daily.  30 tablet  11  . loratadine (CLARITIN) 10 MG tablet Take 1 tablet (10 mg total) by mouth daily.  31 tablet  5  . metoprolol (TOPROL-XL) 50 MG 24 hr tablet Take 1 and 1/2 tablet daily (to make 75 mg daily)  45 tablet  5  . nitroGLYCERIN (NITROSTAT) 0.4 MG SL tablet Place 1 tablet (0.4 mg total) under the tongue once.  1 tablet  0  . valsartan (DIOVAN) 160 MG tablet Take 1 tablet (160 mg total) by mouth daily.  30 tablet  3  . lisinopril (PRINIVIL,ZESTRIL) 10 MG tablet Take 1 tablet (10 mg total) by mouth daily.  30 tablet  11    Allergies: Allergies  Allergen Reactions  . Aspirin     REACTION: Angioedema  . Codeine     REACTION: Vomiting     History  Substance Use Topics  . Smoking status: Never Smoker   . Smokeless tobacco: Not on file  . Alcohol Use: No     PHYSICAL EXAM: VS:  BP 130/67  Pulse 65  Ht 5\' 4"  (1.626 m)  Wt 138 lb (62.596 kg)  BMI 23.69 kg/m2  LMP 02/10/1966 Well nourished, well developed, in no acute distress HEENT: normal  Neck: no JVD  Cardiac: normal S1, S2; RRR; 2/6 systolic murmur RUSB  Lungs: clear to auscultation bilaterally, no wheezing, rhonchi or rales  Abd: soft, nontender, no hepatomegaly  Ext: no edema  Skin: warm and dry  Neuro: CNs 2-12 intact, no focal abnormalities noted  Psych: normal affect   EKG:   Sinus rhythm, heart rate 63, leftward axis, PAC, nonspecific ST-T wave changes  ASSESSMENT AND PLAN:

## 2011-10-28 NOTE — Assessment & Plan Note (Signed)
I think her blood pressure looks good.  I have recommended that she take her isosorbide and metoprolol in the morning.  If she's concerned about her blood pressure dropping too low, she can take her Diovan in the evening.  She knows to contact us if her blood pressure is consistently above 140 systolic.  I have advised her to monitor her blood pressure 2 hours after taking her medications.

## 2011-10-28 NOTE — Assessment & Plan Note (Signed)
Stable.  Continue current medications.

## 2011-10-29 ENCOUNTER — Encounter: Payer: Medicare Other | Admitting: Internal Medicine

## 2011-10-31 ENCOUNTER — Telehealth: Payer: Self-pay | Admitting: Cardiology

## 2011-10-31 NOTE — Telephone Encounter (Signed)
Pt has question re medication

## 2011-10-31 NOTE — Telephone Encounter (Signed)
Mrs Christine [name] did not take the Lisinopril and will not take it.  BP=148/58.

## 2011-10-31 NOTE — Telephone Encounter (Signed)
No.  She is not supposed to be on Lisinopril.  I did not add any new medications to her on Monday.  She should not be taking this and please make sure she has not started it.  I will remove from her list. Tereso Newcomer, PA-C  2:19 PM 10/31/2011

## 2011-10-31 NOTE — Telephone Encounter (Signed)
Spoke to pt regarding new prescription that she picked up from her pharmacy for lisinopril. She does not recall taking that medication in the past and at her appt with Tereso Newcomer on 12/10 she does not remember him starting her on a new medication. She was calling to clarify if she is to start taking lisinopril 10 mg daily or just continue the medications she reviewed with Lorin Picket on Monday. Pt reports BP yesterday two hours after taking meds in the morning was 145/68. Advised pt would check with Tereso Newcomer and get back to her.

## 2011-11-08 ENCOUNTER — Ambulatory Visit: Admit: 2011-11-08 | Payer: Self-pay | Admitting: Cardiovascular Disease

## 2011-11-08 SURGERY — CARDIOVERSION
Anesthesia: Monitor Anesthesia Care

## 2011-11-14 ENCOUNTER — Other Ambulatory Visit: Payer: Self-pay | Admitting: *Deleted

## 2011-11-14 ENCOUNTER — Other Ambulatory Visit: Payer: Self-pay | Admitting: Physician Assistant

## 2011-11-14 ENCOUNTER — Other Ambulatory Visit: Payer: Self-pay | Admitting: Internal Medicine

## 2011-11-14 DIAGNOSIS — E785 Hyperlipidemia, unspecified: Secondary | ICD-10-CM

## 2011-11-14 DIAGNOSIS — M25551 Pain in right hip: Secondary | ICD-10-CM

## 2011-11-14 MED ORDER — IMIPRAMINE HCL 50 MG PO TABS: 50.0000 mg | ORAL_TABLET | Freq: Two times a day (BID) | ORAL | Status: DC

## 2011-11-21 ENCOUNTER — Other Ambulatory Visit: Payer: Self-pay | Admitting: *Deleted

## 2011-11-22 ENCOUNTER — Telehealth: Payer: Self-pay | Admitting: *Deleted

## 2011-11-22 NOTE — Telephone Encounter (Signed)
Pt calls and states she fell Saturday and injured her L side and L arm, she states she thought she would be "ok" but it is still painful, she is referred to urg care for eval and states her family will take her there now.

## 2011-11-25 MED ORDER — GLIMEPIRIDE 1 MG PO TABS: 1.0000 mg | ORAL_TABLET | Freq: Every day | ORAL | Status: DC

## 2011-12-09 ENCOUNTER — Encounter (HOSPITAL_COMMUNITY): Payer: Self-pay | Admitting: *Deleted

## 2011-12-09 ENCOUNTER — Emergency Department (INDEPENDENT_AMBULATORY_CARE_PROVIDER_SITE_OTHER)
Admission: EM | Admit: 2011-12-09 | Discharge: 2011-12-09 | Disposition: A | Discharge status: Home / Self Care | Payer: Medicare Other | Source: Home / Self Care | Attending: Family Medicine | Admitting: Family Medicine

## 2011-12-09 ENCOUNTER — Emergency Department (INDEPENDENT_AMBULATORY_CARE_PROVIDER_SITE_OTHER): Payer: Medicaid Other

## 2011-12-09 DIAGNOSIS — M25519 Pain in unspecified shoulder: Secondary | ICD-10-CM

## 2011-12-09 MED ORDER — TRAMADOL HCL 50 MG PO TABS: 50.0000 mg | ORAL_TABLET | Freq: Four times a day (QID) | ORAL | Status: DC | PRN

## 2011-12-09 MED ORDER — ONDANSETRON HCL 4 MG PO TABS: 4.0000 mg | ORAL_TABLET | Freq: Three times a day (TID) | ORAL | Status: AC | PRN

## 2011-12-09 MED ORDER — HYDROCODONE-ACETAMINOPHEN 5-325 MG PO TABS: ORAL_TABLET | ORAL | Status: AC

## 2011-12-09 NOTE — ED Notes (Signed)
Pt  Reports  She  Felled  In  [employer] [place]  About  3  Weeks  Ago  And  Developed  Pain    She  Reports  She  Had  Limited rom at  That  Time  She  Reports  It  Got  Gradually  Better  Until  A  Few  Days  Ago  And  The  Pain reoccured          She  Is  Sitting  Comfortably  On  Exam  Table          Family  Member  At the  Bedside

## 2011-12-09 NOTE — ED Provider Notes (Signed)
History     CSN: [id number]  Arrival date & time 12/09/11  1304   First MD Initiated Contact with Patient 12/09/11 1316      Chief Complaint  Patient presents with  . Shoulder Pain    (Consider location/radiation/quality/duration/timing/severity/associated sxs/prior treatment) HPI Comments: Christine [name] presents for evaluation of persistent pain in her RIGHT shoulder after falling on it more than a week ago. She reports that she was hoping it "would get better" but she is now having persistent pain and difficulty moving her shoulder. She denies any numbness, tingling, or weakness.   Patient is a 78 y.o. female presenting with shoulder pain. The history is provided by the patient.  Shoulder Pain This is a new problem. The current episode started more than 1 week ago. The problem occurs constantly. The problem has not changed since onset.The symptoms are aggravated by exertion and bending. The symptoms are relieved by nothing.    Past Medical History  Diagnosis Date  . GERD (gastroesophageal reflux disease)   . Hypertension   . Hypothyroidism   . Anemia   . Hyperlipidemia   . Sinus arrhythmia   . History of migraine headaches     Next atypical symptoms in the past. Patient was started on Neurontin for possible neuropathic origin of her pain.  Marland Kitchen COPD (chronic obstructive pulmonary disease)   . Heart murmur, systolic     2-D echo in December 2011 showed a normal EF with grade 1 diastolic dysfunction, trivial pulmonary regurgitation and mildly elevated PA pressure at 37 mmHg probably secondary to her COPD.  . Diabetes mellitus     On oral hypoglycemic agents.  Marland Kitchen CAD (coronary artery disease)     LHC 08/23/11: dLM 40-50%, oRI 40%, oCFX 40%, oD1 70% (small and not amenable to PCI).  LM lesion did not appear to be flow limiting.  Medical rx was recommended.  Echo 08/23/11: mild LVH, EF 60-65%, grade 1 diast dysfxn, mild BAE, PASP 24.    . Carotid stenosis     dopplers 10/12:  0-39% bilat ICA  (repeat 08/2012)    Past Surgical History  Procedure Date  . Abdominal hysterectomy      total abdominal hysterectomy in 1976 for reasons unknown    Family History  Problem Relation Age of Onset  . Cancer Mother 7     cancer of the colon  . COPD Father 42    History  Substance Use Topics  . Smoking status: Never Smoker   . Smokeless tobacco: Not on file  . Alcohol Use: No    OB History    Grav Para Term Preterm Abortions TAB SAB Ect Mult Living                  Review of Systems  Constitutional: Negative.   HENT: Negative.   Eyes: Negative.   Respiratory: Negative.   Cardiovascular: Negative.   Gastrointestinal: Negative.   Genitourinary: Negative.   Musculoskeletal: Positive for arthralgias.       RIGHT shoulder pain  Skin: Negative.   Neurological: Negative.  Negative for weakness and numbness.    Allergies  Aspirin and Codeine  Home Medications   Current Outpatient Rx  Name Route Sig Dispense Refill  . ATORVASTATIN CALCIUM 40 MG PO TABS Oral Take 1 tablet (40 mg total) by mouth daily. 31 tablet 6  . CLOPIDOGREL BISULFATE 75 MG PO TABS  take 1 tablet by mouth daily with food 30 tablet 2  . GLIMEPIRIDE 1 MG  PO TABS Oral Take 1 tablet (1 mg total) by mouth daily before breakfast. 90 tablet 3  . GLUCOSE BLOOD VI STRP  Use as instructed 100 each 6  . IMIPRAMINE HCL 50 MG PO TABS Oral Take 1 tablet (50 mg total) by mouth 2 (two) times daily. 60 tablet 0  . ISOSORBIDE MONONITRATE ER 30 MG PO TB24  take 1 tablet by mouth daily 30 tablet 2  . ISOSORBIDE MONONITRATE ER 60 MG PO TB24 Oral Take 1 tablet (60 mg total) by mouth daily. 30 tablet 3  . LANCETS 28G MISC  Use as directed to check  your sugars 100 each 6  . LEVOTHYROXINE SODIUM 25 MCG PO TABS Oral Take 1 tablet (25 mcg total) by mouth daily. 30 tablet 11  . LORATADINE 10 MG PO TABS Oral Take 1 tablet (10 mg total) by mouth daily. 31 tablet 5  . METOPROLOL SUCCINATE ER 50 MG PO TB24  Take 1 and 1/2 tablet  daily (to make 75 mg daily) 45 tablet 5  . METOPROLOL SUCCINATE ER 25 MG PO TB24  take 1 tablet by mouth daily 30 tablet 2  . NITROGLYCERIN 0.4 MG SL SUBL Sublingual Place 1 tablet (0.4 mg total) under the tongue once. 1 tablet 0  . ONDANSETRON HCL 4 MG PO TABS Oral Take 1 tablet (4 mg total) by mouth every 8 (eight) hours as needed for nausea. 15 tablet 0  . TRAMADOL HCL 50 MG PO TABS Oral Take 1 tablet (50 mg total) by mouth every 6 (six) hours as needed for pain. Maximum dose= 8 tablets per day 15 tablet 0  . VALSARTAN 160 MG PO TABS Oral Take 1 tablet (160 mg total) by mouth daily. 30 tablet 3    Pulse 63  Temp(Src) 98.4 F (36.9 C) (Oral)  Resp 16  SpO2 100%  LMP 02/10/1966  Physical Exam  Nursing note and vitals reviewed. Constitutional: She is oriented to person, place, and time. She appears well-developed and well-nourished.  HENT:  Head: Normocephalic and atraumatic.  Eyes: EOM are normal.  Neck: Normal range of motion.  Pulmonary/Chest: Effort normal.  Musculoskeletal: Normal range of motion.       RIGHT shoulder: full abduction, adduction, flexion, and extension; 5/5 strength with internal and external rotation; 5/5 grip strength; positive Hawkin's, negative Ober's, negative Neer's, negative Speed's test; pain with external and internal rotation and with resisted flexion   Neurological: She is alert and oriented to person, place, and time.  Skin: Skin is warm and dry.  Psychiatric: Her behavior is normal.    ED Course  Procedures (including critical care time)  Labs Reviewed - No data to display Dg Shoulder Right  12/09/2011  *RADIOLOGY REPORT*  Clinical Data: Fall, pain  RIGHT SHOULDER - 2+ VIEW  Comparison: 09/14/2011  Findings: Three views of the right shoulder submitted.  No acute fracture or subluxation.  Degenerative changes are noted right AC joint.  Superior and inferior spurring of the acromion noted.  IMPRESSION: No acute fracture or subluxation.  Degenerative  changes right AC joint.  Spurring of the acromion.  Original Report Authenticated By: Natasha Mead, M.D.     1. Shoulder pain       MDM  X-ray reviewed by me and radiologist; no acute findings; given tramadol, referred to [facility]        Richardo Priest, MD 12/09/11 1622

## 2011-12-12 DIAGNOSIS — M25819 Other specified joint disorders, unspecified shoulder: Secondary | ICD-10-CM | POA: Diagnosis not present

## 2011-12-17 ENCOUNTER — Telehealth: Payer: Self-pay | Admitting: Cardiology

## 2011-12-17 NOTE — Telephone Encounter (Signed)
Pt needs to come off plavix for tooth extraction and most resent INR

## 2011-12-17 NOTE — Telephone Encounter (Signed)
Reviewed with Dr Shirlee Latch. OK for pt to hold Plavix for tooth extraction. I talked with Huntley Dec at Dr Keturah Barre office.

## 2011-12-27 ENCOUNTER — Other Ambulatory Visit: Payer: Self-pay | Admitting: *Deleted

## 2011-12-27 DIAGNOSIS — E785 Hyperlipidemia, unspecified: Secondary | ICD-10-CM

## 2011-12-27 DIAGNOSIS — M25551 Pain in right hip: Secondary | ICD-10-CM

## 2011-12-28 ENCOUNTER — Emergency Department (HOSPITAL_COMMUNITY)
Admission: EM | Admit: 2011-12-28 | Discharge: 2011-12-28 | Disposition: A | Discharge status: Home / Self Care | Payer: Medicare Other | Attending: Emergency Medicine | Admitting: Emergency Medicine

## 2011-12-28 ENCOUNTER — Encounter (HOSPITAL_COMMUNITY): Payer: Self-pay | Admitting: Emergency Medicine

## 2011-12-28 DIAGNOSIS — J449 Chronic obstructive pulmonary disease, unspecified: Secondary | ICD-10-CM | POA: Diagnosis not present

## 2011-12-28 DIAGNOSIS — E785 Hyperlipidemia, unspecified: Secondary | ICD-10-CM | POA: Insufficient documentation

## 2011-12-28 DIAGNOSIS — I2510 Atherosclerotic heart disease of native coronary artery without angina pectoris: Secondary | ICD-10-CM | POA: Diagnosis not present

## 2011-12-28 DIAGNOSIS — E119 Type 2 diabetes mellitus without complications: Secondary | ICD-10-CM | POA: Diagnosis not present

## 2011-12-28 DIAGNOSIS — R011 Cardiac murmur, unspecified: Secondary | ICD-10-CM | POA: Insufficient documentation

## 2011-12-28 DIAGNOSIS — K219 Gastro-esophageal reflux disease without esophagitis: Secondary | ICD-10-CM | POA: Insufficient documentation

## 2011-12-28 DIAGNOSIS — I10 Essential (primary) hypertension: Secondary | ICD-10-CM | POA: Diagnosis not present

## 2011-12-28 DIAGNOSIS — H53149 Visual discomfort, unspecified: Secondary | ICD-10-CM | POA: Diagnosis not present

## 2011-12-28 DIAGNOSIS — E039 Hypothyroidism, unspecified: Secondary | ICD-10-CM | POA: Diagnosis not present

## 2011-12-28 DIAGNOSIS — H5710 Ocular pain, unspecified eye: Secondary | ICD-10-CM | POA: Diagnosis not present

## 2011-12-28 DIAGNOSIS — G43909 Migraine, unspecified, not intractable, without status migrainosus: Secondary | ICD-10-CM | POA: Insufficient documentation

## 2011-12-28 DIAGNOSIS — R110 Nausea: Secondary | ICD-10-CM | POA: Insufficient documentation

## 2011-12-28 DIAGNOSIS — J4489 Other specified chronic obstructive pulmonary disease: Secondary | ICD-10-CM | POA: Insufficient documentation

## 2011-12-28 DIAGNOSIS — R51 Headache: Secondary | ICD-10-CM | POA: Diagnosis not present

## 2011-12-28 MED ORDER — HYDROMORPHONE HCL PF 1 MG/ML IJ SOLN
1.0000 mg | Freq: Once | INTRAMUSCULAR | Status: AC
  Administered 2011-12-28: 1 mg via INTRAVENOUS
  Filled 2011-12-28: qty 1

## 2011-12-28 MED ORDER — SODIUM CHLORIDE 0.9 % IV SOLN
INTRAVENOUS | Status: DC
  Administered 2011-12-28 (×2): via INTRAVENOUS

## 2011-12-28 MED ORDER — METOCLOPRAMIDE HCL 5 MG/ML IJ SOLN
10.0000 mg | Freq: Once | INTRAMUSCULAR | Status: AC
  Administered 2011-12-28: 10 mg via INTRAVENOUS
  Filled 2011-12-28: qty 2

## 2011-12-28 MED ORDER — DIPHENHYDRAMINE HCL 50 MG/ML IJ SOLN
25.0000 mg | Freq: Once | INTRAMUSCULAR | Status: AC
  Administered 2011-12-28: 25 mg via INTRAVENOUS
  Filled 2011-12-28: qty 2

## 2011-12-28 MED ORDER — DEXAMETHASONE SODIUM PHOSPHATE 10 MG/ML IJ SOLN
10.0000 mg | Freq: Once | INTRAMUSCULAR | Status: AC
  Administered 2011-12-28: 10 mg via INTRAVENOUS
  Filled 2011-12-28: qty 1

## 2011-12-28 NOTE — ED Notes (Signed)
Pt reports migraine headache every morning since Sunday. Pt has been using ice packs to ease pain. Pt also reports nausea.

## 2011-12-28 NOTE — ED Provider Notes (Signed)
History     CSN: [id number]  Arrival date & time 12/28/11  7829   First MD Initiated Contact with Patient 12/28/11 [phone]      Chief Complaint  Patient presents with  . Migraine    (Consider location/radiation/quality/duration/timing/severity/associated sxs/prior treatment) HPI Comments: Denies vision changes, paresthesias, weakness. Typical for her prior migraines.  Patient is a 78 y.o. female presenting with migraine. The history is provided by the patient. No language interpreter was used.  Migraine This is a recurrent problem. The current episode started more than 2 days ago (7 days ago). The problem occurs daily. The problem has been gradually worsening. Associated symptoms include headaches. Pertinent negatives include no chest pain, no abdominal pain and no shortness of breath. Exacerbated by: light and sound. The symptoms are relieved by ice. She has tried a cold compress for the symptoms. The treatment provided mild relief.    Past Medical History  Diagnosis Date  . GERD (gastroesophageal reflux disease)   . Hypertension   . Hypothyroidism   . Anemia   . Hyperlipidemia   . Sinus arrhythmia   . History of migraine headaches     Next atypical symptoms in the past. Patient was started on Neurontin for possible neuropathic origin of her pain.  Marland Kitchen COPD (chronic obstructive pulmonary disease)   . Heart murmur, systolic     2-D echo in December 2011 showed a normal EF with grade 1 diastolic dysfunction, trivial pulmonary regurgitation and mildly elevated PA pressure at 37 mmHg probably secondary to her COPD.  . Diabetes mellitus     On oral hypoglycemic agents.  Marland Kitchen CAD (coronary artery disease)     LHC 08/23/11: dLM 40-50%, oRI 40%, oCFX 40%, oD1 70% (small and not amenable to PCI).  LM lesion did not appear to be flow limiting.  Medical rx was recommended.  Echo 08/23/11: mild LVH, EF 60-65%, grade 1 diast dysfxn, mild BAE, PASP 24.    . Carotid stenosis     dopplers 10/12:  0-39%  bilat ICA (repeat 08/2012)    Past Surgical History  Procedure Date  . Abdominal hysterectomy      total abdominal hysterectomy in 1976 for reasons unknown    Family History  Problem Relation Age of Onset  . Cancer Mother 49     cancer of the colon  . COPD Father 25    History  Substance Use Topics  . Smoking status: Never Smoker   . Smokeless tobacco: Not on file  . Alcohol Use: No    OB History    Grav Para Term Preterm Abortions TAB SAB Ect Mult Living                  Review of Systems  Constitutional: Negative for fever, activity change, appetite change and fatigue.  HENT: Negative for congestion, sore throat, rhinorrhea, neck pain and neck stiffness.   Eyes: Positive for photophobia. Negative for pain, discharge and visual disturbance.  Respiratory: Negative for cough and shortness of breath.   Cardiovascular: Negative for chest pain and palpitations.  Gastrointestinal: Positive for nausea. Negative for vomiting and abdominal pain.  Genitourinary: Negative for dysuria, urgency, frequency and flank pain.  Neurological: Positive for headaches. Negative for dizziness, weakness, light-headedness and numbness.  All other systems reviewed and are negative.    Allergies  Aspirin and Codeine  Home Medications   Current Outpatient Rx  Name Route Sig Dispense Refill  . ATORVASTATIN CALCIUM 40 MG PO TABS Oral Take 1  tablet (40 mg total) by mouth daily. 31 tablet 6  . CLOPIDOGREL BISULFATE 75 MG PO TABS  take 1 tablet by mouth daily with food 30 tablet 2  . GLIMEPIRIDE 1 MG PO TABS Oral Take 1 tablet (1 mg total) by mouth daily before breakfast. 90 tablet 3  . IMIPRAMINE HCL 50 MG PO TABS Oral Take 1 tablet (50 mg total) by mouth 2 (two) times daily. 60 tablet 0  . ISOSORBIDE MONONITRATE ER 30 MG PO TB24  take 1 tablet by mouth daily 30 tablet 2  . LEVOTHYROXINE SODIUM 25 MCG PO TABS Oral Take 1 tablet (25 mcg total) by mouth daily. 30 tablet 11  . LORATADINE 10 MG PO  TABS Oral Take 1 tablet (10 mg total) by mouth daily. 31 tablet 5  . METOPROLOL SUCCINATE ER 50 MG PO TB24  Take 1 and 1/2 tablet daily (to make 75 mg daily) 45 tablet 5  . NITROGLYCERIN 0.4 MG SL SUBL Sublingual Place 0.4 mg under the tongue every 5 (five) minutes as needed. For chest pain    . VALSARTAN 160 MG PO TABS Oral Take 1 tablet (160 mg total) by mouth daily. 30 tablet 3  . GLUCOSE BLOOD VI STRP  Use as instructed 100 each 6  . LANCETS 28G MISC  Use as directed to check  your sugars 100 each 6    BP 111/57  Pulse 67  Temp(Src) 97.9 F (36.6 C) (Oral)  Resp 16  SpO2 99%  LMP 02/10/1966  Physical Exam  Nursing note and vitals reviewed. Constitutional: She is oriented to person, place, and time. She appears well-developed and well-nourished.  HENT:  Head: Normocephalic and atraumatic.  Mouth/Throat: Oropharynx is clear and moist.  Eyes: Conjunctivae and EOM are normal. Pupils are equal, round, and reactive to light.       Mild photophobia on exam  Neck: Normal range of motion. Neck supple.  Cardiovascular: Normal rate, regular rhythm, normal heart sounds and intact distal pulses.  Exam reveals no gallop and no friction rub.   No murmur heard. Pulmonary/Chest: Effort normal and breath sounds normal. No respiratory distress.  Abdominal: Soft. Bowel sounds are normal.  Musculoskeletal: Normal range of motion. She exhibits no tenderness.  Neurological: She is alert and oriented to person, place, and time.  Skin: Skin is warm and dry. No rash noted.    ED Course  Procedures (including critical care time)  Labs Reviewed - No data to display No results found.   1. Migraine       MDM  Headache consistent with with migraine headaches. I have no concern about a malignant cause of headache such as subarachnoid hemorrhage or meningitis. There is no indication for imaging or workup at this time. She received a headache cocktail with improvement of her symptoms. Will be  discharged home instructed to followup with her primary care physician        Dayton Bailiff, MD 12/28/11 1243

## 2011-12-28 NOTE — ED Notes (Signed)
States last HA was about 4 years ago

## 2011-12-29 ENCOUNTER — Emergency Department (HOSPITAL_COMMUNITY)
Admission: EM | Admit: 2011-12-29 | Discharge: 2011-12-29 | Disposition: A | Discharge status: Home / Self Care | Payer: Medicare Other | Attending: Emergency Medicine | Admitting: Emergency Medicine

## 2011-12-29 ENCOUNTER — Emergency Department (HOSPITAL_COMMUNITY): Payer: Medicare Other

## 2011-12-29 ENCOUNTER — Encounter (HOSPITAL_COMMUNITY): Payer: Self-pay | Admitting: *Deleted

## 2011-12-29 DIAGNOSIS — J449 Chronic obstructive pulmonary disease, unspecified: Secondary | ICD-10-CM | POA: Insufficient documentation

## 2011-12-29 DIAGNOSIS — E039 Hypothyroidism, unspecified: Secondary | ICD-10-CM | POA: Insufficient documentation

## 2011-12-29 DIAGNOSIS — Z79899 Other long term (current) drug therapy: Secondary | ICD-10-CM | POA: Insufficient documentation

## 2011-12-29 DIAGNOSIS — I10 Essential (primary) hypertension: Secondary | ICD-10-CM | POA: Insufficient documentation

## 2011-12-29 DIAGNOSIS — E785 Hyperlipidemia, unspecified: Secondary | ICD-10-CM | POA: Diagnosis not present

## 2011-12-29 DIAGNOSIS — H53149 Visual discomfort, unspecified: Secondary | ICD-10-CM | POA: Insufficient documentation

## 2011-12-29 DIAGNOSIS — E119 Type 2 diabetes mellitus without complications: Secondary | ICD-10-CM | POA: Diagnosis not present

## 2011-12-29 DIAGNOSIS — K219 Gastro-esophageal reflux disease without esophagitis: Secondary | ICD-10-CM | POA: Diagnosis not present

## 2011-12-29 DIAGNOSIS — G43909 Migraine, unspecified, not intractable, without status migrainosus: Secondary | ICD-10-CM | POA: Insufficient documentation

## 2011-12-29 DIAGNOSIS — I2510 Atherosclerotic heart disease of native coronary artery without angina pectoris: Secondary | ICD-10-CM | POA: Diagnosis not present

## 2011-12-29 DIAGNOSIS — R110 Nausea: Secondary | ICD-10-CM | POA: Diagnosis not present

## 2011-12-29 DIAGNOSIS — R51 Headache: Secondary | ICD-10-CM | POA: Diagnosis not present

## 2011-12-29 DIAGNOSIS — J4489 Other specified chronic obstructive pulmonary disease: Secondary | ICD-10-CM | POA: Insufficient documentation

## 2011-12-29 DIAGNOSIS — H5710 Ocular pain, unspecified eye: Secondary | ICD-10-CM | POA: Diagnosis not present

## 2011-12-29 LAB — BASIC METABOLIC PANEL
CO2: 23 mEq/L (ref 19–32)
Calcium: 9.7 mg/dL (ref 8.4–10.5)
Creatinine, Ser: 0.94 mg/dL (ref 0.50–1.10)
Glucose, Bld: 117 mg/dL — ABNORMAL HIGH (ref 70–99)

## 2011-12-29 LAB — DIFFERENTIAL
Basophils Absolute: 0 10*3/uL (ref 0.0–0.1)
Eosinophils Relative: 0 % (ref 0–5)
Lymphocytes Relative: 31 % (ref 12–46)
Lymphs Abs: 3.2 10*3/uL (ref 0.7–4.0)
Monocytes Absolute: 0.7 10*3/uL (ref 0.1–1.0)

## 2011-12-29 LAB — CBC
HCT: 36.3 % (ref 36.0–46.0)
MCV: 87.9 fL (ref 78.0–100.0)
RDW: 14.4 % (ref 11.5–15.5)
WBC: 10.2 10*3/uL (ref 4.0–10.5)

## 2011-12-29 MED ORDER — DIPHENHYDRAMINE HCL 50 MG/ML IJ SOLN
25.0000 mg | Freq: Once | INTRAMUSCULAR | Status: AC
  Administered 2011-12-29: 25 mg via INTRAVENOUS
  Filled 2011-12-29: qty 1

## 2011-12-29 MED ORDER — ONDANSETRON 8 MG PO TBDP
8.0000 mg | ORAL_TABLET | Freq: Three times a day (TID) | ORAL | Status: AC | PRN
Start: 2011-12-29 — End: 2012-01-05

## 2011-12-29 MED ORDER — HYDROCODONE-ACETAMINOPHEN 5-325 MG PO TABS: 2.0000 | ORAL_TABLET | ORAL | Status: DC | PRN

## 2011-12-29 MED ORDER — DEXAMETHASONE SODIUM PHOSPHATE 10 MG/ML IJ SOLN
10.0000 mg | Freq: Once | INTRAMUSCULAR | Status: AC
  Administered 2011-12-29: 10 mg via INTRAVENOUS
  Filled 2011-12-29: qty 1

## 2011-12-29 MED ORDER — METOCLOPRAMIDE HCL 5 MG/ML IJ SOLN
10.0000 mg | Freq: Once | INTRAMUSCULAR | Status: AC
  Administered 2011-12-29: 10 mg via INTRAVENOUS
  Filled 2011-12-29: qty 2

## 2011-12-29 MED ORDER — SODIUM CHLORIDE 0.9 % IV SOLN
INTRAVENOUS | Status: DC
  Administered 2011-12-29: 12:00:00 via INTRAVENOUS

## 2011-12-29 NOTE — ED Notes (Signed)
Pt reports having a severe migraine with nausea. Was here yesterday for same, no relief with otc meds.

## 2011-12-29 NOTE — ED Provider Notes (Signed)
Medical screening examination/treatment/procedure(s) were conducted as a shared visit with non-physician practitioner(s) and myself.  I personally evaluated the patient during the encounter  Continued migraine HA.  No neuro sx.  Nausea but no vomiting.  HA resolved yesterday during ed visit but has since returned.  HA cocktail and reassess.  Likely dc home.  Labs and ct since repeat visit  Dayton Bailiff, MD 12/29/11 1328

## 2011-12-29 NOTE — ED Provider Notes (Signed)
History     CSN: [id number]  Arrival date & time 12/29/11  1016   First MD Initiated Contact with Patient 12/29/11 1051      Chief Complaint  Patient presents with  . Migraine    (Consider location/radiation/quality/duration/timing/severity/associated sxs/prior treatment) HPI Comments: Patient was seen in the ED yesterday for similar headache.  She reports that she tried taking OTC medication this morning, but it was not helping.  She denies any recent trauma.  She denies any nausea/vomiting.  She reports that her headache today is similar to migraine headaches she has had in the past.  She does have some mild photophobia associated with headache.  Denies fever, neck pain, or neck stiffness.  Patient is a 78 y.o. female presenting with migraine. The history is provided by the patient.  Migraine Associated symptoms include headaches. Pertinent negatives include no chest pain, chills, diaphoresis, fever, nausea, neck pain, numbness, rash, visual change, vomiting or weakness.    Past Medical History  Diagnosis Date  . GERD (gastroesophageal reflux disease)   . Hypertension   . Hypothyroidism   . Anemia   . Hyperlipidemia   . Sinus arrhythmia   . History of migraine headaches     Next atypical symptoms in the past. Patient was started on Neurontin for possible neuropathic origin of her pain.  Marland Kitchen COPD (chronic obstructive pulmonary disease)   . Heart murmur, systolic     2-D echo in December 2011 showed a normal EF with grade 1 diastolic dysfunction, trivial pulmonary regurgitation and mildly elevated PA pressure at 37 mmHg probably secondary to her COPD.  . Diabetes mellitus     On oral hypoglycemic agents.  Marland Kitchen CAD (coronary artery disease)     LHC 08/23/11: dLM 40-50%, oRI 40%, oCFX 40%, oD1 70% (small and not amenable to PCI).  LM lesion did not appear to be flow limiting.  Medical rx was recommended.  Echo 08/23/11: mild LVH, EF 60-65%, grade 1 diast dysfxn, mild BAE, PASP 24.    .  Carotid stenosis     dopplers 10/12:  0-39% bilat ICA (repeat 08/2012)    Past Surgical History  Procedure Date  . Abdominal hysterectomy      total abdominal hysterectomy in 1976 for reasons unknown    Family History  Problem Relation Age of Onset  . Cancer Mother 41     cancer of the colon  . COPD Father 36    History  Substance Use Topics  . Smoking status: Never Smoker   . Smokeless tobacco: Not on file  . Alcohol Use: No    OB History    Grav Para Term Preterm Abortions TAB SAB Ect Mult Living                  Review of Systems  Constitutional: Negative for fever, chills and diaphoresis.  HENT: Negative for neck pain and neck stiffness.   Eyes: Positive for photophobia. Negative for visual disturbance.  Respiratory: Negative for shortness of breath.   Cardiovascular: Negative for chest pain.  Gastrointestinal: Negative for nausea and vomiting.  Skin: Negative for rash.  Neurological: Positive for headaches. Negative for weakness and numbness.  Psychiatric/Behavioral: Negative for confusion.    Allergies  Aspirin; Codeine; and Penicillins  Home Medications   Current Outpatient Rx  Name Route Sig Dispense Refill  . ATORVASTATIN CALCIUM 40 MG PO TABS Oral Take 40 mg by mouth every evening.    Marland Kitchen CLOPIDOGREL BISULFATE 75 MG PO TABS Oral  Take 75 mg by mouth daily.    Marland Kitchen ESOMEPRAZOLE MAGNESIUM 40 MG PO CPDR Oral Take 40 mg by mouth daily before breakfast.    . GLIMEPIRIDE 1 MG PO TABS Oral Take 1 tablet (1 mg total) by mouth daily before breakfast. 90 tablet 3  . IMIPRAMINE HCL 50 MG PO TABS Oral Take 1 tablet (50 mg total) by mouth 2 (two) times daily. 60 tablet 0  . ISOSORBIDE MONONITRATE ER 30 MG PO TB24 Oral Take 30 mg by mouth daily.    Marland Kitchen LEVOTHYROXINE SODIUM 25 MCG PO TABS Oral Take 1 tablet (25 mcg total) by mouth daily. 30 tablet 11  . LORATADINE 10 MG PO TABS Oral Take 1 tablet (10 mg total) by mouth daily. 31 tablet 5  . METOPROLOL SUCCINATE ER 50 MG PO  TB24 Oral Take 75 mg by mouth daily. Take 1 and 1/2 tablet daily (to make 75 mg daily)    . VALSARTAN 160 MG PO TABS Oral Take 1 tablet (160 mg total) by mouth daily. 30 tablet 3  . GLUCOSE BLOOD VI STRP  Use as instructed 100 each 6  . LANCETS 28G MISC  Use as directed to check  your sugars 100 each 6  . NITROGLYCERIN 0.4 MG SL SUBL Sublingual Place 0.4 mg under the tongue every 5 (five) minutes as needed. For chest pain      BP 126/57  Pulse 62  Temp(Src) 98.1 F (36.7 C) (Oral)  Resp 18  SpO2 99%  LMP 02/10/1966  Physical Exam  Nursing note and vitals reviewed. Constitutional: She is oriented to person, place, and time. She appears well-developed and well-nourished.  HENT:  Head: Normocephalic and atraumatic.  Eyes: EOM are normal. Pupils are equal, round, and reactive to light.  Neck: Normal range of motion. Neck supple.  Cardiovascular: Normal rate, regular rhythm and normal heart sounds.   Pulmonary/Chest: Effort normal and breath sounds normal. No respiratory distress. She has no wheezes.  Abdominal: Soft. Bowel sounds are normal.  Musculoskeletal: Normal range of motion.  Neurological: She is alert and oriented to person, place, and time. No cranial nerve deficit or sensory deficit.  Skin: Skin is warm and dry.  Psychiatric: She has a normal mood and affect.    ED Course  Procedures (including critical care time)  Labs Reviewed  BASIC METABOLIC PANEL - Abnormal; Notable for the following:    Potassium 3.3 (*)    Glucose, Bld 117 (*)    GFR calc non Af Amer 58 (*)    GFR calc Af Amer 67 (*)    All other components within normal limits  CBC  DIFFERENTIAL   No results found.   No diagnosis found.  12:25 PM Reassessed patient.  She reports that her headache has improved. 1:19 PM Reassessed patient.  She reports that her headache has resolved completely.  MDM  Patient comes in to the ED for the second time in two days with a headache.  She reports that the  headache feels like her typical migraine pain, but since she returned after being seen yesterday a head CT was ordered.  Head CT negative and labs unremarkable.  Headache resolved after migraine cocktail.  Therefore, feel that patient can be discharged home.  Patient in agreement with plan.        Pascal Lux [place], PA-C 12/29/11 1321  [address] [place], [place] 12/29/11 1323

## 2011-12-30 MED ORDER — IMIPRAMINE HCL 50 MG PO TABS: 50.0000 mg | ORAL_TABLET | Freq: Two times a day (BID) | ORAL | Status: DC

## 2012-01-01 ENCOUNTER — Ambulatory Visit (INDEPENDENT_AMBULATORY_CARE_PROVIDER_SITE_OTHER): Payer: Medicare Other | Admitting: Internal Medicine

## 2012-01-01 ENCOUNTER — Encounter: Payer: Self-pay | Admitting: Internal Medicine

## 2012-01-01 VITALS — BP 122/61 | HR 86 | Temp 97.0°F | Ht 65.0 in | Wt 138.7 lb

## 2012-01-01 DIAGNOSIS — E785 Hyperlipidemia, unspecified: Secondary | ICD-10-CM | POA: Diagnosis not present

## 2012-01-01 DIAGNOSIS — G43909 Migraine, unspecified, not intractable, without status migrainosus: Secondary | ICD-10-CM

## 2012-01-01 DIAGNOSIS — Z79899 Other long term (current) drug therapy: Secondary | ICD-10-CM | POA: Diagnosis not present

## 2012-01-01 DIAGNOSIS — E119 Type 2 diabetes mellitus without complications: Secondary | ICD-10-CM

## 2012-01-01 DIAGNOSIS — I2510 Atherosclerotic heart disease of native coronary artery without angina pectoris: Secondary | ICD-10-CM | POA: Diagnosis not present

## 2012-01-01 DIAGNOSIS — E039 Hypothyroidism, unspecified: Secondary | ICD-10-CM | POA: Diagnosis not present

## 2012-01-01 MED ORDER — KETOROLAC TROMETHAMINE 30 MG/ML IJ SOLN
30.0000 mg | Freq: Once | INTRAMUSCULAR | Status: AC
  Administered 2012-01-01: 60 mg via INTRAMUSCULAR

## 2012-01-01 MED ORDER — HYDROCODONE-ACETAMINOPHEN 5-325 MG PO TABS: 1.0000 | ORAL_TABLET | ORAL | Status: DC | PRN

## 2012-01-01 MED ORDER — DIPHENHYDRAMINE HCL 50 MG/ML IJ SOLN: 25.0000 mg | Freq: Once | INTRAMUSCULAR | Status: DC

## 2012-01-01 MED ORDER — KETOROLAC TROMETHAMINE 60 MG/2ML IM SOLN: 60.0000 mg | Freq: Once | INTRAMUSCULAR | Status: DC

## 2012-01-01 NOTE — Patient Instructions (Signed)
Follow up in 3 months Let me know if you are feeling better in a week. If not, we may have to do some more workup for your migraine  Migraine Headache A migraine is very bad pain on one or both sides of your head. The cause of a migraine is not always known. A migraine can be triggered or caused by different things, such as:  Alcohol.   Smoking.   Stress.   Periods (menstruation) in women.   Aged cheeses.   Foods or drinks that contain nitrates, glutamate, aspartame, or tyramine.   Lack of sleep.   Chocolate.   Caffeine.   Hunger.   Medicines, such as nitroglycerine (used to treat chest pain), birth control pills, estrogen, and some blood pressure medicines.  HOME CARE  Many medicines can help migraine pain or keep migraines from coming back. Your doctor can help you decide on a medicine or treatment program.   If you or your child gets a migraine, it may help to lie down in a dark, quiet room.   Keep a headache journal. This may help find out what is causing the headaches. For example, write down:   What you eat and drink.   How much sleep you get.   Any change to your diet or medicines.  GET HELP RIGHT AWAY IF:   The medicine does not work.   The pain begins again.   The neck is stiff.   You have trouble seeing.   The muscles are weak or you lose muscle control.   You have new symptoms.   You lose your balance.   You have trouble walking.   You feel faint or pass out.  MAKE SURE YOU:   Understand these instructions.   Will watch this condition.   Will get help right away if you are not doing well or get worse.  Document Released: 08/13/2008 Document Revised: 07/17/2011 Document Reviewed: 07/10/2009 [facility] Patient Information 2012 [place], [place].

## 2012-01-01 NOTE — Assessment & Plan Note (Signed)
Controlled, no TSH  Check today needed

## 2012-01-01 NOTE — Assessment & Plan Note (Signed)
Follows Dr. Shirlee Latch. Medical management and risk reduction for non critical obstruction

## 2012-01-01 NOTE — Progress Notes (Signed)
[name], female   DOB: 01/24/1934, 78 y.o.   MRN: [id number]  78 year old woman with past medical history of hypothyroidism, diabetes, hypertension, hyperlipidemia, DJD of the back, Non obs CAD and migraine comes to the clinic for follow up.  She is complaining of severe headache today. She thinks this is her migraine headache. The headache is on right side of her head. It's 7-8 on 10. She has it every morning at 8:30. This has been going on for past one and half week. She has not had this kind of headache for many years now. She could not remember any triggering factor or change in her medicines one or 2 weeks ago that could have caused the headaches. She has been to the emergency room twice for this headache. she wakes up at about 5 in the morning and the headache starts 3 hours after that. She has some nausea associated with it but no vomiting. She has a premonition that she is going to get a headache but no aura. No vision change. She tries Tylenol and Norco for the headaches. This eases it down. It usually lasts 1-2 hours. It comes back again next morning. Complaint with medications      Physical exam   General Appearance:     Filed Vitals:   01/01/12 0820  BP: 122/61  Pulse: 86  Temp: 97 F (36.1 C)  TempSrc: Oral  Height: 5\' 5"  (1.651 m)  Weight: 138 lb 11.2 oz (62.914 kg)  SpO2: 99%     Alert, cooperative, no distress, appears stated age  Head:    Normocephalic, without obvious abnormality, atraumatic  Eyes:    PERRL, conjunctiva/corneas clear, EOM's intact, fundi    benign, both eyes       Neck:   Supple, symmetrical, trachea midline, no adenopathy;       thyroid:  No enlargement/tenderness/nodules; no carotid   bruit or JVD  Lungs:     Clear to auscultation bilaterally, respirations unlabored  Chest wall:    No tenderness or deformity  Heart:    Regular rate and rhythm, S1 and S2 normal, no murmur, rub   or gallop  Abdomen:     Soft, non-tender, bowel  sounds active all four quadrants,    no masses, no organomegaly  Extremities:   Extremities normal, atraumatic, no cyanosis or edema  Pulses:   2+ and symmetric all extremities  Skin:   Skin color, texture, turgor normal, no rashes or lesions  Neurologic:  nonfocal grossly   ROS  Constitutional: Denies fever, chills, diaphoresis, appetite change and fatigue.  Respiratory: Denies SOB, DOE, cough, chest tightness,  and wheezing.   Cardiovascular: Denies chest pain, palpitations and leg swelling.  Gastrointestinal: Denies nausea, vomiting, abdominal pain, diarrhea, constipation, blood in stool and abdominal distention.  Skin: Denies pallor, rash and wound.  Neurological: Denies dizziness, light-headedness, numbness and headaches.

## 2012-01-01 NOTE — Assessment & Plan Note (Signed)
LDL at goal on current management

## 2012-01-01 NOTE — Assessment & Plan Note (Signed)
Her history is not typical for migraine she has this and her problem list for many years now and she is on imipramine for prophylaxis along with Toprol. She has severe headache right now with Toradol and Benadryl IM. I will increase the dose of her imipramine to 100 mg in the evening. If this does not control her headache we can go up to 100 mg in the morning as well as making it a max dose of 200 mg for imipramine. I will not prescribe her triptan because she has headaches every day and with her heart disease taking triptans times every day may be expensive and cause adverse effects. I did also advise her not to take ibuprofen or Advil for abortive treatment of migraine because of her cardiac condition. I did prescribe her 20 tablets of Vicodin without any refills. If she asked for further refills she may need to be thoroughly evaluated for migraine. This is a new onset headache in a 78 year old woman. We may need to pursue an MRI and/or refer her to a neurologist. I will follow her up in one month. I will ask her to call me back in one week if the headaches are not controlled.

## 2012-01-01 NOTE — Assessment & Plan Note (Signed)
Check A1c Chem checked recently, creatinine function ok On glimeperide and controlled for a long time now No change in medications Foot exam done- no skin break down, callus, other abnormality Refer to opthalmology  On ARB and statin

## 2012-01-10 ENCOUNTER — Other Ambulatory Visit: Payer: Self-pay | Admitting: Internal Medicine

## 2012-01-10 DIAGNOSIS — G43909 Migraine, unspecified, not intractable, without status migrainosus: Secondary | ICD-10-CM

## 2012-01-10 MED ORDER — HYDROCODONE-ACETAMINOPHEN 5-325 MG PO TABS: 1.0000 | ORAL_TABLET | ORAL | Status: DC | PRN

## 2012-01-10 NOTE — Telephone Encounter (Signed)
Message sent to front desk for an appt. 

## 2012-01-13 ENCOUNTER — Ambulatory Visit (INDEPENDENT_AMBULATORY_CARE_PROVIDER_SITE_OTHER): Payer: Medicare Other | Admitting: Internal Medicine

## 2012-01-13 ENCOUNTER — Encounter: Payer: Self-pay | Admitting: Internal Medicine

## 2012-01-13 ENCOUNTER — Other Ambulatory Visit: Payer: Self-pay | Admitting: Physician Assistant

## 2012-01-13 DIAGNOSIS — G43909 Migraine, unspecified, not intractable, without status migrainosus: Secondary | ICD-10-CM | POA: Diagnosis not present

## 2012-01-13 DIAGNOSIS — I10 Essential (primary) hypertension: Secondary | ICD-10-CM

## 2012-01-13 MED ORDER — ESOMEPRAZOLE MAGNESIUM 40 MG PO CPDR: 40.0000 mg | DELAYED_RELEASE_CAPSULE | Freq: Every day | ORAL | Status: DC

## 2012-01-13 MED ORDER — ATORVASTATIN CALCIUM 40 MG PO TABS: 40.0000 mg | ORAL_TABLET | Freq: Every evening | ORAL | Status: DC

## 2012-01-13 MED ORDER — IMIPRAMINE PAMOATE 100 MG PO CAPS: 100.0000 mg | ORAL_CAPSULE | Freq: Two times a day (BID) | ORAL | Status: DC

## 2012-01-13 MED ORDER — VALSARTAN 160 MG PO TABS: 160.0000 mg | ORAL_TABLET | Freq: Every day | ORAL | Status: DC

## 2012-01-13 MED ORDER — ESOMEPRAZOLE MAGNESIUM 40 MG PO PACK: 40.0000 mg | PACK | Freq: Every day | ORAL | Status: DC

## 2012-01-13 NOTE — Assessment & Plan Note (Signed)
Her headaches have improved. She still gets headaches once every 2 or 3 days each are less intense and short-lived. Vicodin is helping for breakthrough pain and imipramine one 50 mg has reduced the frequency and intensity of the headaches. She describes them as pain on the right side of her head mostly concentrated around her right eye, sometimes diffuse, no bloody lesion or floater, tingling, numbness or any other neurological symptoms. She states this feels similar to her previous migraine headaches. She did not have it for past 6 years it started after she took nitroglycerin glycerin for her anginal pain. She has not taken any nitroglycerin tablets in past 3 weeks. She is on Imdur but she has been taking it for a year. She was being seen by Dr. Anne Hahn for headaches many years ago at which time her MRIs were negative as per her.  -Increase imipramine to a maximum dose of 200 mg a day. Patient warned about the side effects and call me back if she experiences them  - She has 10 more Vicodin left. -If she still has headaches in a week, but discussed with her on the phone what she wants to do. Most likely the may refer her back to Dr. Anne Hahn for reevaluation of her migraine. His headaches are better, start titrating imipramine down

## 2012-01-13 NOTE — Patient Instructions (Signed)
If you are not feeling better in 1 week, we will have to refer you to Dr.Willis for a follow up appointment for your headaches.

## 2012-01-13 NOTE — Progress Notes (Signed)
[name], female   DOB: 01/24/1934, 78 y.o.   MRN: [id number]  Christine [name] returns for her migraine headache evaluation See assessment and plan for details  Physical exam   General Appearance:     Filed Vitals:   01/13/12 0912  BP: 121/59  Pulse: 81  Temp: 97.4 F (36.3 C)  TempSrc: Oral  Height: 5\' 5"  (1.651 m)  Weight: 139 lb 11.2 oz (63.368 kg)  SpO2: 99%     Alert, cooperative, no distress, appears stated age  Head:    Normocephalic, without obvious abnormality, atraumatic  Eyes:    PERRL, conjunctiva/corneas clear, EOM's intact, fundi    benign, both eyes       Neck:   Supple, symmetrical, trachea midline, no adenopathy;       thyroid:  No enlargement/tenderness/nodules; no carotid   bruit or JVD  Lungs:     Clear to auscultation bilaterally, respirations unlabored  Chest wall:    No tenderness or deformity  Heart:    Regular rate and rhythm, S1 and S2 normal, no murmur, rub   or gallop  Abdomen:     Soft, non-tender, bowel sounds active all four quadrants,    no masses, no organomegaly  Extremities:   Extremities normal, atraumatic, no cyanosis or edema  Pulses:   2+ and symmetric all extremities  Skin:   Skin color, texture, turgor normal, no rashes or lesions  Neurologic:  nonfocal grossly   Review of systems  Constitutional: Denies fever, chills, diaphoresis, appetite change and fatigue.  Respiratory: Denies SOB, DOE, cough, chest tightness,  and wheezing.   Cardiovascular: Denies chest pain, palpitations and leg swelling.  Gastrointestinal: Denies nausea, vomiting, abdominal pain, diarrhea, constipation, blood in stool and abdominal distention.  Skin: Denies pallor, rash and wound.  Neurological: Denies dizziness, light-headedness, numbness

## 2012-01-14 ENCOUNTER — Telehealth: Payer: Self-pay | Admitting: *Deleted

## 2012-01-14 MED ORDER — IMIPRAMINE HCL 50 MG PO TABS: ORAL_TABLET | ORAL | Status: DC

## 2012-01-14 NOTE — Telephone Encounter (Signed)
Fax from [facility] - Imipramine (Tofranil-Pm) 100mg  capsules are not covered by pt's insurance.  The pharmacist states she has been on the tablets. Wants to know if you can change pt to Imipramine 100mg  tablets  Thanks

## 2012-01-14 NOTE — Telephone Encounter (Signed)
Changed Meds, Rx sent

## 2012-01-15 ENCOUNTER — Encounter: Payer: Medicare Other | Admitting: Internal Medicine

## 2012-01-21 ENCOUNTER — Telehealth: Payer: Self-pay | Admitting: *Deleted

## 2012-01-21 NOTE — Telephone Encounter (Signed)
Pt called at let you know her headaches are doing better, she has not had to take any meds since last Thursday. Pt 3 [phone]

## 2012-01-22 NOTE — Telephone Encounter (Signed)
Excellent, thanks. 

## 2012-01-27 ENCOUNTER — Encounter: Payer: Self-pay | Admitting: Cardiology

## 2012-01-27 ENCOUNTER — Ambulatory Visit (INDEPENDENT_AMBULATORY_CARE_PROVIDER_SITE_OTHER): Payer: Medicare Other | Admitting: Cardiology

## 2012-01-27 ENCOUNTER — Telehealth: Payer: Self-pay | Admitting: *Deleted

## 2012-01-27 ENCOUNTER — Other Ambulatory Visit: Payer: Medicare Other

## 2012-01-27 DIAGNOSIS — I10 Essential (primary) hypertension: Secondary | ICD-10-CM | POA: Diagnosis not present

## 2012-01-27 DIAGNOSIS — I2510 Atherosclerotic heart disease of native coronary artery without angina pectoris: Secondary | ICD-10-CM | POA: Diagnosis not present

## 2012-01-27 DIAGNOSIS — E785 Hyperlipidemia, unspecified: Secondary | ICD-10-CM

## 2012-01-27 LAB — LIPID PANEL
HDL: 41.9 mg/dL (ref >39.00)
LDL Cholesterol: 49 mg/dL (ref 0–99)
Total CHOL/HDL Ratio: 3
Triglycerides: 150 mg/dL — ABNORMAL HIGH (ref 0.0–149.0)
VLDL: 30 mg/dL (ref 0.0–40.0)

## 2012-01-27 LAB — HEPATIC FUNCTION PANEL: Albumin: 4.1 g/dL (ref 3.5–5.2)

## 2012-01-27 MED ORDER — METOPROLOL SUCCINATE ER 100 MG PO TB24: 100.0000 mg | ORAL_TABLET | Freq: Every day | ORAL | Status: DC

## 2012-01-27 NOTE — Patient Instructions (Signed)
Increase Toprol XL (metoprolol succinate)  to 100mg  daily. You can take two 50mg  tablets daily at the same time.  Your physician recommends that you have a FASTING lipid profile /liver profile today.  Your physician wants you to follow-up in: 6 months with Dr Shirlee Latch. (Septem ber 2013) You will receive a reminder letter in the mail two months in advance. If you don't receive a letter, please call our office to schedule the follow-up appointment.

## 2012-01-27 NOTE — Assessment & Plan Note (Signed)
Check lipids/LFTs.  Goal LDL < 70.  

## 2012-01-27 NOTE — Assessment & Plan Note (Signed)
BP still running a bit high.  Today I will increase Toprol XL to 100 mg daily.  This should also raise anginal threshold.   I have asked her to start walking on her treadmill for exercise.

## 2012-01-27 NOTE — Telephone Encounter (Signed)
pt notified of lab results today, gave verbal understanding. Christine [name]  

## 2012-01-27 NOTE — Telephone Encounter (Signed)
Message copied by Tarri Fuller on Mon Jan 27, 2012  5:01 PM ------      Message from: [place], [place] T      Created: Mon Jan 27, 2012  4:17 PM       Please notify patient that the lab results are ok.      Tereso Newcomer, PA-C  4:17 PM 01/27/2012

## 2012-01-27 NOTE — Assessment & Plan Note (Signed)
Non-flow limiting disease on cath in 10/12.  Chest pain is much improved.  She does have a 40-50% distal left main stenosis and will need careful followup.  Continue Plavix (allergic to aspirin), Imdur, Toprol XL, valsartan, and atorvastatin.

## 2012-01-27 NOTE — Progress Notes (Signed)
PCP: Dr. Scot Dock  78 yo with history of CAD, HTN, and diabetes presents for cardiology followup.  I initially saw Christine [name] in the hospital in 10/12 with chest pain.  Left heart cath showed a 40-50% distal left main stenosis that was not thought to be flow-limiting.  She additionally had a 75% stenosis in a diagonal.  She continued to have chest pain post-discharge but was much improved.  She saw our PA in followup this fall and her cardiac meds were titrated.  She had 1 episode of chest pain about a month ago while walking down some stairs and had to take a nitroglycerin.  Other than that, she has had no further chest pain since her last appointment with the PA (improved).  She denies dyspnea with her daily activities.  BP is elevated today and runs in the 130s-140s systolic at home.   Labs (2/13): K 3.3, creatinine 0.9  PMH: 1. CAD: Chest pain 10/12.  LHC with 40-50% distal LM stenosis, 70-75% ostial D1.  No flow-limiting lesions, medically managed.  Echo (10/12): EF 60-65%, mild LV hypertrophy, PA systolic pressure 24 mmHg.  2. DM2 3. HTN 4. Hyperlipidemia 5. CKD 6. Carotid dopplers (10/12): 0-39% bilateral carotid stenosis.  7. Allergy to aspirin => rash.  8. GERD 9. Migraines 10. Hypothyroidism 11. COPD  SH: Nonsmoker.  Lives in [place].   FH: CAD  ROS: All systems reviewed and negative except as per HPI.   Current Outpatient Prescriptions  Medication Sig Dispense Refill  . atorvastatin (LIPITOR) 40 MG tablet Take 1 tablet (40 mg total) by mouth every evening.  90 tablet  4  . clopidogrel (PLAVIX) 75 MG tablet Take 75 mg by mouth daily.      Marland Kitchen esomeprazole (NEXIUM) 40 MG capsule Take 1 capsule (40 mg total) by mouth daily before breakfast.  90 capsule  4  . glimepiride (AMARYL) 1 MG tablet Take 1 tablet (1 mg total) by mouth daily before breakfast.  90 tablet  3  . glucose blood test strip Use as instructed  100 each  6  . imipramine (TOFRANIL) 50 MG tablet 2 tablets in morning  and 2 tablets in evening  120 tablet  1  . isosorbide mononitrate (IMDUR) 30 MG 24 hr tablet Take 30 mg by mouth daily.      . Lancets 28G MISC Use as directed to check  your sugars  100 each  6  . loratadine (CLARITIN) 10 MG tablet Take 1 tablet (10 mg total) by mouth daily.  31 tablet  5  . nitroGLYCERIN (NITROSTAT) 0.4 MG SL tablet Place 0.4 mg under the tongue every 5 (five) minutes as needed. For chest pain      . valsartan (DIOVAN) 160 MG tablet Take 1 tablet (160 mg total) by mouth daily.  90 tablet  4  . DISCONTD: metoprolol succinate (TOPROL-XL) 50 MG 24 hr tablet Take 75 mg by mouth daily. Take 1 and 1/2 tablet daily (to make 75 mg daily)      . levothyroxine (LEVOTHROID) 25 MCG tablet Take 1 tablet (25 mcg total) by mouth daily.  30 tablet  11  . metoprolol succinate (TOPROL XL) 100 MG 24 hr tablet Take 1 tablet (100 mg total) by mouth daily. Take with or immediately following a meal.  30 tablet  6   Current Facility-Administered Medications  Medication Dose Route Frequency Provider Last Rate Last Dose  . diphenhydrAMINE (BENADRYL) injection 25 mg  25 mg Intramuscular Once Madhav V  Devani, MD       BP 149/71  Pulse 79  Ht 5\' 5"  (1.651 m)  Wt 138 lb (62.596 kg)  BMI 22.96 kg/m2  LMP 02/10/1966 General: NAD Neck: No JVD, no thyromegaly or thyroid nodule.  Lungs: Clear to auscultation bilaterally with normal respiratory effort. CV: Nondisplaced PMI.  Heart regular S1/S2, no S3/S4, 1/6 SEM.  No peripheral edema.  No carotid bruit.  Normal pedal pulses.  Abdomen: Soft, nontender, no hepatosplenomegaly, no distention.  Neurologic: Alert and oriented x 3.  Psych: Normal affect. Extremities: No clubbing or cyanosis.

## 2012-01-29 ENCOUNTER — Telehealth: Payer: Self-pay | Admitting: Cardiology

## 2012-01-29 ENCOUNTER — Telehealth: Payer: Self-pay | Admitting: *Deleted

## 2012-01-29 NOTE — Telephone Encounter (Signed)
Pt calls and c/o cough and congestion for 1+wks, productive cough, green/ yellow thick mucous, denies fevers, h/a N&V. Desires appt. appt given per chilonb. For 3/14 at 1045.

## 2012-01-29 NOTE — Telephone Encounter (Signed)
Talked with pt 

## 2012-01-29 NOTE — Telephone Encounter (Signed)
Pt calling visit yesterday

## 2012-01-30 ENCOUNTER — Ambulatory Visit (INDEPENDENT_AMBULATORY_CARE_PROVIDER_SITE_OTHER): Payer: Medicare Other | Admitting: Internal Medicine

## 2012-01-30 ENCOUNTER — Ambulatory Visit (HOSPITAL_COMMUNITY)
Admission: RE | Admit: 2012-01-30 | Discharge: 2012-01-30 | Disposition: A | Discharge status: Home / Self Care | Payer: Medicare Other | Source: Ambulatory Visit | Attending: Internal Medicine | Admitting: Internal Medicine

## 2012-01-30 ENCOUNTER — Encounter: Payer: Medicare Other | Admitting: Internal Medicine

## 2012-01-30 VITALS — BP 115/63 | HR 97 | Temp 97.4°F | Wt 140.7 lb

## 2012-01-30 DIAGNOSIS — I10 Essential (primary) hypertension: Secondary | ICD-10-CM

## 2012-01-30 DIAGNOSIS — R059 Cough, unspecified: Secondary | ICD-10-CM | POA: Insufficient documentation

## 2012-01-30 DIAGNOSIS — E785 Hyperlipidemia, unspecified: Secondary | ICD-10-CM

## 2012-01-30 DIAGNOSIS — E119 Type 2 diabetes mellitus without complications: Secondary | ICD-10-CM

## 2012-01-30 DIAGNOSIS — J069 Acute upper respiratory infection, unspecified: Secondary | ICD-10-CM | POA: Diagnosis not present

## 2012-01-30 DIAGNOSIS — E039 Hypothyroidism, unspecified: Secondary | ICD-10-CM

## 2012-01-30 DIAGNOSIS — R05 Cough: Secondary | ICD-10-CM | POA: Insufficient documentation

## 2012-01-30 LAB — BASIC METABOLIC PANEL WITH GFR
Chloride: 102 mEq/L (ref 96–112)
GFR, Est Non African American: 48 mL/min — ABNORMAL LOW
Potassium: 4.2 mEq/L (ref 3.5–5.3)

## 2012-01-30 LAB — CBC WITH DIFFERENTIAL/PLATELET
Basophils Absolute: 0 10*3/uL (ref 0.0–0.1)
HCT: 36.3 % (ref 36.0–46.0)
Hemoglobin: 11.4 g/dL — ABNORMAL LOW (ref 12.0–15.0)
Lymphocytes Relative: 34 % (ref 12–46)
Monocytes Absolute: 0.5 10*3/uL (ref 0.1–1.0)
Monocytes Relative: 9 % (ref 3–12)
Neutro Abs: 3.1 10*3/uL (ref 1.7–7.7)
RDW: 15.8 % — ABNORMAL HIGH (ref 11.5–15.5)
WBC: 5.7 10*3/uL (ref 4.0–10.5)

## 2012-01-30 LAB — TSH: TSH: 2.725 u[IU]/mL (ref 0.350–4.500)

## 2012-01-30 MED ORDER — AZITHROMYCIN 500 MG PO TABS: 500.0000 mg | ORAL_TABLET | Freq: Every day | ORAL | Status: AC

## 2012-01-30 MED ORDER — BENZONATATE 100 MG PO CAPS: 100.0000 mg | ORAL_CAPSULE | Freq: Three times a day (TID) | ORAL | Status: AC | PRN

## 2012-01-30 NOTE — Assessment & Plan Note (Signed)
HbA1c in 12/2011 was 6.8, well controlled with Amaryl.  She reports having GI side effects from Metformin. -Will continue Amaryl 1mg  daily -Continue statin -Continue ARB

## 2012-01-30 NOTE — Assessment & Plan Note (Signed)
Likely bronchitis.  She does have purulent sputum and is tachycardic today. Lung exam was benign, normal breath sounds, no wheezes or crackles, no acute respiratory distress.  -Will get labs: CBC, BMP -Will get chest Xray to rule out infiltrates -Start Azithromycin 500mg  po qd x 5 days -Tessalon Pearls 100mg  po q8hrs prn cough -Advised her to stay hydrated -Will follow up in 1 weeks if symptoms worsen

## 2012-01-30 NOTE — Assessment & Plan Note (Signed)
Well controlled.  Patient was seen by Dr. Shirlee Latch about 2 days ago and her Metoprolol was increased to 100mg .  Her BP is much improved today; however, she is tachycardic on exam which is likely 2/2 to acute URI. -Will continue current regimen at this time

## 2012-01-30 NOTE — Assessment & Plan Note (Signed)
Tachycardic on exam which likely 2/2 infection instead of her thyroid disease.  Her last TSH in 08/2011 was wnl. -Will repeat TSH today

## 2012-01-30 NOTE — Patient Instructions (Signed)
Start taking Azithromycin 500mg  one tablet daily x 5 days You may take Tessalon Pearls one tablet every 8 hours as needed for cough Get Chest Xray today Get labs today and I will call you with any abnormal lab results Follow up with Dr. Anselm Jungling in 1-2 weeks if symptoms worsen or if you develop fever, shortness of breath or chest pain

## 2012-01-30 NOTE — Progress Notes (Signed)
HPI: Christine [name] is 78 yo W with PMH of HLP, GERD, CAD, HLP, hypothyroidism, migraine, COPD, DM, carotid stenosis presents today for a cold x 1 week.  She has a cough with yellow-green sputum, no bloody sputum.  No fever, chills, N/V, abdominal.  ++ chest pain associates with cough, SOB.  She took cold Tylenol but did not help.   She also has nasal drainage and post nasal drip.  Her daughter and grandson with upper respiratory infection.  No other complaints today.    ROS: ++cough, SOB, chest pain.  No fever or chills or headache.   PE: General: alert, well-developed, and cooperative to examination.  Mouth: oro-pharynx pink and moist, no erythema or exudates  Lungs: normal respiratory effort, no accessory muscle use, normal breath sounds, no crackles, and no wheezes. Heart: tachycardic, regular rhythm, 1/6 systolic murmur, no gallop, and no rub.  Abdomen: soft, non-tender, normal bowel sounds, no distention, no guarding, no rebound tenderness Pulses: 2+ DP/PT pulses bilaterally Extremities: No cyanosis, clubbing, edema Neurologic: alert & oriented X3, cranial nerves II-XII intact, strength normal in all extremities, sensation intact to light touch, and gait normal.

## 2012-01-30 NOTE — Assessment & Plan Note (Signed)
Well controlled.  LDL was 49 in 01/2012 and her LFTs wnl -Will continue statins

## 2012-02-04 ENCOUNTER — Other Ambulatory Visit: Payer: Self-pay | Admitting: Cardiology

## 2012-02-04 ENCOUNTER — Other Ambulatory Visit: Payer: Self-pay | Admitting: *Deleted

## 2012-02-05 ENCOUNTER — Other Ambulatory Visit: Payer: Self-pay

## 2012-02-05 MED ORDER — CLOPIDOGREL BISULFATE 75 MG PO TABS: 75.0000 mg | ORAL_TABLET | Freq: Every day | ORAL | Status: DC

## 2012-02-12 ENCOUNTER — Telehealth: Payer: Self-pay | Admitting: *Deleted

## 2012-02-12 NOTE — Telephone Encounter (Signed)
Pt calls and states she has recurring uri felt some better and now feels bad like she was at her last visit. appt 3/28 1015 dr Anselm Jungling

## 2012-02-13 ENCOUNTER — Ambulatory Visit (INDEPENDENT_AMBULATORY_CARE_PROVIDER_SITE_OTHER): Payer: Medicare Other | Admitting: Internal Medicine

## 2012-02-13 ENCOUNTER — Encounter: Payer: Self-pay | Admitting: Internal Medicine

## 2012-02-13 DIAGNOSIS — J309 Allergic rhinitis, unspecified: Secondary | ICD-10-CM | POA: Insufficient documentation

## 2012-02-13 MED ORDER — CETIRIZINE HCL 10 MG PO TABS: 10.0000 mg | ORAL_TABLET | Freq: Every day | ORAL | Status: DC

## 2012-02-13 MED ORDER — PHENYLEPHRINE HCL 10 MG PO TABS: 10.0000 mg | ORAL_TABLET | ORAL | Status: DC | PRN

## 2012-02-13 MED ORDER — MOMETASONE FUROATE 50 MCG/ACT NA SUSP: 2.0000 | Freq: Every day | NASAL | Status: DC

## 2012-02-13 NOTE — Progress Notes (Signed)
HPI: Christine [name] is a 78 year old woman with past medical history of hypertension, GERD, hypothyroidism, hyperlipidemia, migraine headache, COPD, CAD, CHF, carotid stenosis presents today for followup. Patient was seen 2 weeks ago for upper respiratory infection and was treated with 5 days course of azithromycin in which she reported feeling better after taking the medication however starting Sunday, patient began to feel "sick" with symptoms of sneezing, postnasal drip, nasal drainage, itchy eyes, dry cough with occasional productive sputum of clear to white phlegm.  She denies any purulent sputum, fever, chills, or shortness of breath. She does endorses chest pain associated with coughing spell he denies any radiation, diaphoresis, nausea or vomiting.  No other complaints today.  Review of system: As per history of present illness  Physical examination:  General: alert, well-developed, and cooperative to examination.  Nose: Pale nasal mucosa with clear drainage. Mouth: oro-pharynx pink and moist, no erythema or exudates Lungs: normal respiratory effort, no accessory muscle use, normal breath sounds, no crackles, and no wheezes. Heart: tachycardic, regular rhythm, 1/6 systolic murmur, no gallop, and no rub.  Abdomen: soft, non-tender, normal bowel sounds, no distention, no guarding, no rebound tenderness Pulses: 2+ DP/PT pulses bilaterally Extremities: No cyanosis, clubbing, edema Neurologic: alert & oriented X3, cranial nerves II-XII intact, strength normal in all extremities, sensation intact to light touch, and gait normal.

## 2012-02-13 NOTE — Patient Instructions (Signed)
Start using Nasonex 2 prays in each nostril at bedtime Take Zyrtec 10mg  one tablet daily Take Phenylephrine 10mg  one tablet every 4-6 hours as needed for nasal congestion Follow up with primary care physician as needed or return to clinic if symptoms worsen

## 2012-02-13 NOTE — Assessment & Plan Note (Signed)
Clinical symptoms and signs consistent with allergic rhinitis; therefore antibiotics is not indicated at this time.. Patient has already been treated with a five-day course of azithromycin on March 14 for purulent sputum at that time. -Will treat with Zyrtec 10 mg by mouth daily -Patient may take Sudafed over-the-counter for nasal decongestion -Nasonex 2 spray in each nostril at bedtime -If patient continues to have severe allergy symptoms that are not controlled by over-the-counter antihistamines, I would try Kenalog injection next office visit -Patient was instructed to call the office if she develops purulent sputum and fever. She would need to go to the ED if she developed chest pain with radiation or increased in shortness of breath.

## 2012-02-19 ENCOUNTER — Telehealth: Payer: Self-pay | Admitting: Internal Medicine

## 2012-02-19 NOTE — Telephone Encounter (Signed)
Called patient and left a V/M concerning her correct birth date.  Asked her to return my call or speak with [facility]

## 2012-02-24 ENCOUNTER — Telehealth: Payer: Self-pay | Admitting: *Deleted

## 2012-02-24 NOTE — Telephone Encounter (Signed)
Pt c/o left side neck pain, on and off since last night.   Pt took tylenol with relief and she used heat to area.  Would you suggest anything else? Pt # [id number]

## 2012-02-25 NOTE — Telephone Encounter (Signed)
Pt informed and voices understanding 

## 2012-02-25 NOTE — Telephone Encounter (Signed)
Has a h/o migraines and recent allergic rhinitis, possible MSK pain. Agree with  Tylenol and heat pads. No further eval needed if resolved.  Thank you.

## 2012-02-26 ENCOUNTER — Encounter: Payer: Self-pay | Admitting: Internal Medicine

## 2012-02-26 ENCOUNTER — Telehealth: Payer: Self-pay | Admitting: Dietician

## 2012-02-26 ENCOUNTER — Ambulatory Visit (INDEPENDENT_AMBULATORY_CARE_PROVIDER_SITE_OTHER): Payer: Medicare Other | Admitting: Internal Medicine

## 2012-02-26 VITALS — BP 102/59 | HR 61 | Temp 97.1°F | Wt 139.6 lb

## 2012-02-26 DIAGNOSIS — D649 Anemia, unspecified: Secondary | ICD-10-CM | POA: Diagnosis not present

## 2012-02-26 DIAGNOSIS — G43909 Migraine, unspecified, not intractable, without status migrainosus: Secondary | ICD-10-CM | POA: Diagnosis not present

## 2012-02-26 DIAGNOSIS — R5383 Other fatigue: Secondary | ICD-10-CM | POA: Insufficient documentation

## 2012-02-26 DIAGNOSIS — I2510 Atherosclerotic heart disease of native coronary artery without angina pectoris: Secondary | ICD-10-CM | POA: Diagnosis not present

## 2012-02-26 DIAGNOSIS — R5381 Other malaise: Secondary | ICD-10-CM

## 2012-02-26 DIAGNOSIS — E119 Type 2 diabetes mellitus without complications: Secondary | ICD-10-CM

## 2012-02-26 LAB — FERRITIN: Ferritin: 74 ng/mL (ref 10–291)

## 2012-02-26 LAB — COMPREHENSIVE METABOLIC PANEL
Albumin: 4.3 g/dL (ref 3.5–5.2)
CO2: 24 mEq/L (ref 19–32)
Calcium: 10.1 mg/dL (ref 8.4–10.5)
Chloride: 103 mEq/L (ref 96–112)
Glucose, Bld: 96 mg/dL (ref 70–99)
Potassium: 4.3 mEq/L (ref 3.5–5.3)
Sodium: 137 mEq/L (ref 135–145)
Total Protein: 6.8 g/dL (ref 6.0–8.3)

## 2012-02-26 MED ORDER — METOPROLOL SUCCINATE ER 50 MG PO TB24: 50.0000 mg | ORAL_TABLET | Freq: Every day | ORAL | Status: DC

## 2012-02-26 MED ORDER — IMIPRAMINE HCL 50 MG PO TABS: ORAL_TABLET | ORAL | Status: DC

## 2012-02-26 MED ORDER — ONETOUCH ULTRA 2 W/DEVICE KIT: PACK | Status: DC

## 2012-02-26 NOTE — Progress Notes (Signed)
Patient ID: TALAYLA DOYEL, female   DOB: 01/24/1934, 78 y.o.   MRN: [id number] Patient ID: LATHA STAUNTON, female   DOB: 01/24/1934, 78 y.o.   MRN: [id number]  78 Y/o w with pmh listed below comes for c/o fatigue Ongoing for 2 weeks Feels tired when she walk or exerts, her exercise capacity has gone down significantly Also, shortness  Of breath associated with it.  Mild fatigue throughout the day but that is close to baseline Feels fresh in am when she wakes up . Complaint with medications The only change in her meds is increase in toprol to 100mg  4 weeks ago my cardiologist.  Modena Jansky on refills See individual a/p for further details.   Physical exam  General Appearance:     Filed Vitals:   02/26/12 1548  BP: 102/59  Pulse: 61  Temp: 97.1 F (36.2 C)  TempSrc: Oral  Weight: 139 lb 9.6 oz (63.322 kg)     Alert, cooperative, no distress, appears stated age  Head:    Normocephalic, without obvious abnormality, atraumatic  Eyes:    PERRL, conjunctiva/corneas clear, EOM's intact, fundi    benign, both eyes       Neck:   Supple, symmetrical, trachea midline, no adenopathy;       thyroid:  No enlargement/tenderness/nodules; no carotid   bruit or JVD  Lungs:     Clear to auscultation bilaterally, respirations unlabored  Chest wall:    No tenderness or deformity  Heart:    Regular rate and rhythm, S1 and S2 normal, no murmur, rub   or gallop  Abdomen:     Soft, non-tender, bowel sounds active all four quadrants,    no masses, no organomegaly  Extremities:   Extremities normal, atraumatic, no cyanosis or edema  Pulses:   2+ and symmetric all extremities  Skin:   Skin color, texture, turgor normal, no rashes or lesions  Neurologic:  nonfocal grossly    ROS  Constitutional: Denies fever, chills, diaphoresis, appetite change  Respiratory: Denies SOB,, cough, chest tightness,  and wheezing.   Cardiovascular: Denies chest pain, palpitations and leg swelling.  Gastrointestinal: Denies  nausea, vomiting, abdominal pain, diarrhea, constipation, blood in stool and abdominal distention.  Skin: Denies pallor, rash and wound.  Neurological: Denies dizziness, light-headedness, numbness and headaches.

## 2012-02-26 NOTE — Telephone Encounter (Signed)
Left with nurse to give to patient at her appointment today

## 2012-02-26 NOTE — Assessment & Plan Note (Signed)
Given the temporal associated with increase BB dose, this may be a side effect of BB But with her complicated history, other Likely causes are anemia, worsening kidney function, hypothyroidism (although well controlled) ,CHF, silent ischemia, hypoglycemia, depression Also her BP and pulse is lowest compared to previous readings No wt loss   Plan - check cbc, cmet, b12, ferritin, vit d, TSH checked recently,  - Reduced torpol to 50 xl - follow up in 2-4 weeks, if no improvement, check 2D echo.  - Also, tofranil side effect can cause fatigue so taper it down if continues to have fatige - if no benefit, SSRI

## 2012-02-26 NOTE — Patient Instructions (Signed)
Take 1 tablet of metoprolol instead of 2

## 2012-02-27 LAB — CBC WITH DIFFERENTIAL/PLATELET
Basophils Absolute: 0 10*3/uL (ref 0.0–0.1)
Eosinophils Absolute: 0.2 10*3/uL (ref 0.0–0.7)
Lymphocytes Relative: 38 % (ref 12–46)
Lymphs Abs: 2.3 10*3/uL (ref 0.7–4.0)
Neutrophils Relative %: 49 % (ref 43–77)
Platelets: 280 10*3/uL (ref 150–400)
RBC: 3.88 MIL/uL (ref 3.87–5.11)
RDW: 15.8 % — ABNORMAL HIGH (ref 11.5–15.5)
WBC: 6.2 10*3/uL (ref 4.0–10.5)

## 2012-02-27 LAB — MICROALBUMIN / CREATININE URINE RATIO
Creatinine, Urine: 65.7 mg/dL
Microalb Creat Ratio: 7.6 mg/g (ref 0.0–30.0)

## 2012-03-02 ENCOUNTER — Telehealth: Payer: Self-pay | Admitting: *Deleted

## 2012-03-02 NOTE — Telephone Encounter (Signed)
Pt called and states SOB about the same - no change. Feels tired. Had questions about anemia.Appt made 03/05/12 8:45AM Dr Scot Dock - will call if any change SOB. Stanton Kidney Joyce Heitman RN 03/02/12 1:45PM

## 2012-03-05 ENCOUNTER — Other Ambulatory Visit: Payer: Self-pay | Admitting: Internal Medicine

## 2012-03-05 ENCOUNTER — Ambulatory Visit (HOSPITAL_COMMUNITY)
Admission: RE | Admit: 2012-03-05 | Discharge: 2012-03-05 | Disposition: A | Discharge status: Home / Self Care | Payer: Medicare Other | Source: Ambulatory Visit | Attending: Internal Medicine | Admitting: Internal Medicine

## 2012-03-05 ENCOUNTER — Encounter: Payer: Self-pay | Admitting: Internal Medicine

## 2012-03-05 ENCOUNTER — Ambulatory Visit (INDEPENDENT_AMBULATORY_CARE_PROVIDER_SITE_OTHER): Payer: Medicare Other | Admitting: Internal Medicine

## 2012-03-05 VITALS — BP 125/59 | HR 64 | Temp 97.0°F | Ht 65.0 in | Wt 138.2 lb

## 2012-03-05 DIAGNOSIS — R9431 Abnormal electrocardiogram [ECG] [EKG]: Secondary | ICD-10-CM | POA: Diagnosis not present

## 2012-03-05 DIAGNOSIS — I2510 Atherosclerotic heart disease of native coronary artery without angina pectoris: Secondary | ICD-10-CM

## 2012-03-05 DIAGNOSIS — E119 Type 2 diabetes mellitus without complications: Secondary | ICD-10-CM | POA: Diagnosis not present

## 2012-03-05 DIAGNOSIS — R0602 Shortness of breath: Secondary | ICD-10-CM | POA: Diagnosis not present

## 2012-03-05 DIAGNOSIS — R5381 Other malaise: Secondary | ICD-10-CM

## 2012-03-05 DIAGNOSIS — R5383 Other fatigue: Secondary | ICD-10-CM

## 2012-03-05 LAB — GLUCOSE, CAPILLARY: Glucose-Capillary: 172 mg/dL — ABNORMAL HIGH (ref 70–99)

## 2012-03-05 MED ORDER — LANCETS 28G MISC: Status: DC

## 2012-03-05 NOTE — Assessment & Plan Note (Addendum)
Her history is more suggestive of cardiac-related issue is the cause of her fatigue today and perform an EKG   refer her to a cardiologist for a stress test/catheterization See details below

## 2012-03-05 NOTE — Progress Notes (Signed)
[name], female   DOB: 01/24/1934, 78 y.o.   MRN: [id number]  Christine [name] Returns for evaluation of her exertional dyspnea and fatigue Ongoing for past 3-4 weeks  Reduced exercise capacity Associated with one incidence of left-sided chest pain and left arm pain. She describes this as her typical anginal pain, cough without any sputum No leg edema, paroxysmal nocturnal dyspnea, lightheadedness, constant chest pain , rest chest pain, or other complaints Was not seen by her cardiologist in March at which time she did not have these symptoms  Physical exam   General Appearance:     Filed Vitals:   03/05/12 0854  BP: 125/59  Pulse: 64  Temp: 97 F (36.1 C)  TempSrc: Oral  Height: 5\' 5"  (1.651 m)  Weight: 138 lb 3.2 oz (62.687 kg)  SpO2: 100%     Alert, cooperative, no distress, appears stated age  Head:    Normocephalic, without obvious abnormality, atraumatic  Eyes:    PERRL, conjunctiva/corneas clear, EOM's intact, fundi    benign, both eyes       Neck:   Supple, symmetrical, trachea midline, no adenopathy;       thyroid:  No enlargement/tenderness/nodules; no carotid   bruit or JVD  Lungs:     Clear to auscultation bilaterally, respirations unlabored  Chest wall:    No tenderness or deformity  Heart:    Regular rate and rhythm, S1 and S2 normal, no murmur, rub   or gallop  Abdomen:     Soft, non-tender, bowel sounds active all four quadrants,    no masses, no organomegaly  Extremities:   Extremities normal, atraumatic, no cyanosis or edema  Pulses:   2+ and symmetric all extremities  Skin:   Skin color, texture, turgor normal, no rashes or lesions  Neurologic:  nonfocal grossly    Review of system- as per history of present illness

## 2012-03-05 NOTE — Assessment & Plan Note (Signed)
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

## 2012-03-09 ENCOUNTER — Encounter: Payer: Self-pay | Admitting: Physician Assistant

## 2012-03-09 ENCOUNTER — Ambulatory Visit (INDEPENDENT_AMBULATORY_CARE_PROVIDER_SITE_OTHER): Payer: Medicare Other | Admitting: Physician Assistant

## 2012-03-09 DIAGNOSIS — I2510 Atherosclerotic heart disease of native coronary artery without angina pectoris: Secondary | ICD-10-CM

## 2012-03-09 DIAGNOSIS — R079 Chest pain, unspecified: Secondary | ICD-10-CM | POA: Diagnosis not present

## 2012-03-09 DIAGNOSIS — R5381 Other malaise: Secondary | ICD-10-CM

## 2012-03-09 DIAGNOSIS — R5383 Other fatigue: Secondary | ICD-10-CM

## 2012-03-09 MED ORDER — ISOSORBIDE MONONITRATE ER 30 MG PO TB24: 60.0000 mg | ORAL_TABLET | Freq: Every day | ORAL | Status: DC

## 2012-03-09 NOTE — Assessment & Plan Note (Signed)
Patient's fatigue did not improve with lowering her metoprolol.I'm not convinced this is cardiac related.

## 2012-03-09 NOTE — Assessment & Plan Note (Signed)
Patient has coronary artery disease with 40-50% distal left main and 75% diagonal for which she is being treated medically. Her main complaint today is fatigue and dyspnea on exertion. It is difficult to tell if this is this unique mediated. She is not having any increase in her chest pain. I will try to increase her Imdur to 60 mg daily to see if this helps. She will follow-up with Dr. Shirlee Latch in 2-4 weeks.

## 2012-03-09 NOTE — Progress Notes (Signed)
HPI:  This is a 78 year old female patient of Dr. Shirlee Latch who has a history of coronary artery disease status post catheter in October 2012 that showed 40-50% distal left main stenosis that was not thought to be flow limiting. She also had a 75% stenosis in the diagonal. She was treated medically. She was recently seen in March 2013 by Dr. Shirlee Latch who increased her metoprolol to 100 mg daily for better blood pressure control and encouraged an exercise program.  The patient has been complaining of increased fatigue with activity and shortness of breath when going up and down stairs. She also sometimes has a stabbing pain in her left arm when she's folding laundry. She also has had significant pain in her left neck with very little movement. She was told she had arthritis and to use a heating pad. Her primary M.D. Decreased her Toprol to 50 mg daily because her blood pressure was 102 systolic and he thought it may be contributing to her fatigue. She did this 3 weeks ago but has not noticed any improvement. She has occasional chest pain but no significant increase in her chest pain. She denies chest pain when she is going up and down the stairs. Her main complaint is fatigue with some increasing dyspnea on exertion.    Allergies  Allergen Reactions  . Aspirin     REACTION: Angioedema  . Codeine     REACTION: Vomiting  . Penicillins Nausea And Vomiting    Current Outpatient Prescriptions on File Prior to Visit  Medication Sig Dispense Refill  . atorvastatin (LIPITOR) 40 MG tablet Take 1 tablet (40 mg total) by mouth every evening.  90 tablet  4  . Blood Glucose Monitoring Suppl (ONE TOUCH ULTRA 2) W/DEVICE KIT Use to check blood sugar as instructed  1 each  0  . cetirizine (ZYRTEC ALLERGY) 10 MG tablet Take 1 tablet (10 mg total) by mouth daily.  31 tablet  3  . clopidogrel (PLAVIX) 75 MG tablet Take 1 tablet (75 mg total) by mouth daily.  30 tablet  4  . esomeprazole (NEXIUM) 40 MG capsule Take 1  capsule (40 mg total) by mouth daily before breakfast.  90 capsule  4  . glimepiride (AMARYL) 1 MG tablet Take 1 tablet (1 mg total) by mouth daily before breakfast.  90 tablet  3  . glucose blood test strip Use as instructed  100 each  6  . imipramine (TOFRANIL) 50 MG tablet 2 tablet in morning and 2 tablet in evening  120 tablet  1  . isosorbide mononitrate (IMDUR) 30 MG 24 hr tablet Take 30 mg by mouth daily.      . Lancets 28G MISC Use as directed to check  your sugars  100 each  6  . metoprolol succinate (TOPROL XL) 50 MG 24 hr tablet Take 1 tablet (50 mg total) by mouth daily. Take with or immediately following a meal.      . nitroGLYCERIN (NITROSTAT) 0.4 MG SL tablet Place 0.4 mg under the tongue every 5 (five) minutes as needed. For chest pain      . valsartan (DIOVAN) 160 MG tablet Take 1 tablet (160 mg total) by mouth daily.  90 tablet  4  . levothyroxine (LEVOTHROID) 25 MCG tablet Take 1 tablet (25 mcg total) by mouth daily.  30 tablet  11   Current Facility-Administered Medications on File Prior to Visit  Medication Dose Route Frequency Provider Last Rate Last Dose  . diphenhydrAMINE (BENADRYL) injection  25 mg  25 mg Intramuscular Once Zollie Beckers, MD        Past Medical History  Diagnosis Date  . GERD (gastroesophageal reflux disease)   . Hypertension   . Hypothyroidism   . Anemia   . Hyperlipidemia   . Sinus arrhythmia   . History of migraine headaches     Next atypical symptoms in the past. Patient was started on Neurontin for possible neuropathic origin of her pain.  Marland Kitchen COPD (chronic obstructive pulmonary disease)   . Heart murmur, systolic     2-D echo in December 2011 showed a normal EF with grade 1 diastolic dysfunction, trivial pulmonary regurgitation and mildly elevated PA pressure at 37 mmHg probably secondary to her COPD.  . Diabetes mellitus     On oral hypoglycemic agents.  Marland Kitchen CAD (coronary artery disease)     LHC 08/23/11: dLM 40-50%, oRI 40%, oCFX 40%, oD1  70% (small and not amenable to PCI).  LM lesion did not appear to be flow limiting.  Medical rx was recommended.  Echo 08/23/11: mild LVH, EF 60-65%, grade 1 diast dysfxn, mild BAE, PASP 24.    . Carotid stenosis     dopplers 10/12:  0-39% bilat ICA (repeat 08/2012)    Past Surgical History  Procedure Date  . Abdominal hysterectomy      total abdominal hysterectomy in 1976 for reasons unknown    Family History  Problem Relation Age of Onset  . Cancer Mother 46     cancer of the colon  . COPD Father 66    History   Social History  . Marital Status: Widowed    Spouse Name: N/A    Number of Children: N/A  . Years of Education: N/A   Occupational History  . Not on file.   Social History Main Topics  . Smoking status: Never Smoker   . Smokeless tobacco: Not on file  . Alcohol Use: No  . Drug Use: No  . Sexually Active: No   Other Topics Concern  . Not on file   Social History Narrative    Never smoked. Care for her daughter who has had a lung transplant. 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 Neck: No JVD, HJR, Bruit, or thyroid enlargement Lungs: No tachypnea, clear without wheezing, rales, or rhonchi Cardiovascular: RRR, PMI not displaced, positive S4 and 2/6 systolic murmur at the left sternal border, no bruit, thrill, or heave. Abdomen: BS normal. Soft without organomegaly, masses, lesions or tenderness. Extremities: without cyanosis, clubbing or edema. Good distal pulses bilateral SKin: Warm, no lesions or rashes  Musculoskeletal: No deformities Neuro: no focal signs  BP 120/55  Pulse 84  Ht 5\' 5"  (1.651 m)  Wt 140 lb 6.4 oz (63.685 kg)  BMI 23.36 kg/m2  LMP 02/10/1966   ZOX:WRUEAV sinus rhythm old septal infarct

## 2012-03-09 NOTE — Patient Instructions (Addendum)
Your physician has recommended you make the following change in your medication: Increase Imdur to 60 mg daily.  You can take 2 of your 30 mg tablets each day until you run out.  This medication has been sent to your pharmacy.     Your physician recommends that you schedule a follow-up appointment in: 2 to 4 weeks with Dr. Shirlee Latch, or closest available.

## 2012-03-13 ENCOUNTER — Encounter: Payer: Self-pay | Admitting: Internal Medicine

## 2012-03-13 ENCOUNTER — Encounter (HOSPITAL_COMMUNITY): Payer: Self-pay | Admitting: Internal Medicine

## 2012-03-13 ENCOUNTER — Inpatient Hospital Stay (HOSPITAL_COMMUNITY)
Admission: AD | Admit: 2012-03-13 | Discharge: 2012-03-17 | DRG: 287 | Disposition: A | Discharge status: Home / Self Care | Payer: Medicare Other | Source: Ambulatory Visit | Attending: Internal Medicine | Admitting: Internal Medicine

## 2012-03-13 ENCOUNTER — Telehealth: Payer: Self-pay | Admitting: Cardiology

## 2012-03-13 ENCOUNTER — Ambulatory Visit (INDEPENDENT_AMBULATORY_CARE_PROVIDER_SITE_OTHER): Payer: Medicare Other | Admitting: Internal Medicine

## 2012-03-13 ENCOUNTER — Inpatient Hospital Stay (HOSPITAL_COMMUNITY): Payer: Medicare Other

## 2012-03-13 ENCOUNTER — Other Ambulatory Visit: Payer: Self-pay

## 2012-03-13 VITALS — BP 114/60 | HR 63 | Temp 97.0°F | Ht 65.0 in | Wt 135.5 lb

## 2012-03-13 DIAGNOSIS — I152 Hypertension secondary to endocrine disorders: Secondary | ICD-10-CM | POA: Insufficient documentation

## 2012-03-13 DIAGNOSIS — Z79899 Other long term (current) drug therapy: Secondary | ICD-10-CM

## 2012-03-13 DIAGNOSIS — I509 Heart failure, unspecified: Secondary | ICD-10-CM | POA: Diagnosis present

## 2012-03-13 DIAGNOSIS — M129 Arthropathy, unspecified: Secondary | ICD-10-CM | POA: Diagnosis present

## 2012-03-13 DIAGNOSIS — N189 Chronic kidney disease, unspecified: Secondary | ICD-10-CM | POA: Diagnosis present

## 2012-03-13 DIAGNOSIS — I6529 Occlusion and stenosis of unspecified carotid artery: Secondary | ICD-10-CM | POA: Diagnosis present

## 2012-03-13 DIAGNOSIS — D509 Iron deficiency anemia, unspecified: Secondary | ICD-10-CM | POA: Diagnosis present

## 2012-03-13 DIAGNOSIS — I517 Cardiomegaly: Secondary | ICD-10-CM | POA: Diagnosis not present

## 2012-03-13 DIAGNOSIS — I129 Hypertensive chronic kidney disease with stage 1 through stage 4 chronic kidney disease, or unspecified chronic kidney disease: Secondary | ICD-10-CM | POA: Diagnosis present

## 2012-03-13 DIAGNOSIS — Z7902 Long term (current) use of antithrombotics/antiplatelets: Secondary | ICD-10-CM | POA: Diagnosis not present

## 2012-03-13 DIAGNOSIS — R079 Chest pain, unspecified: Secondary | ICD-10-CM | POA: Diagnosis not present

## 2012-03-13 DIAGNOSIS — J4489 Other specified chronic obstructive pulmonary disease: Secondary | ICD-10-CM | POA: Diagnosis present

## 2012-03-13 DIAGNOSIS — E119 Type 2 diabetes mellitus without complications: Secondary | ICD-10-CM | POA: Insufficient documentation

## 2012-03-13 DIAGNOSIS — Z9849 Cataract extraction status, unspecified eye: Secondary | ICD-10-CM | POA: Diagnosis not present

## 2012-03-13 DIAGNOSIS — Z9889 Other specified postprocedural states: Secondary | ICD-10-CM

## 2012-03-13 DIAGNOSIS — G43909 Migraine, unspecified, not intractable, without status migrainosus: Secondary | ICD-10-CM | POA: Diagnosis present

## 2012-03-13 DIAGNOSIS — K219 Gastro-esophageal reflux disease without esophagitis: Secondary | ICD-10-CM | POA: Diagnosis present

## 2012-03-13 DIAGNOSIS — I200 Unstable angina: Secondary | ICD-10-CM | POA: Diagnosis present

## 2012-03-13 DIAGNOSIS — M353 Polymyalgia rheumatica: Secondary | ICD-10-CM | POA: Diagnosis present

## 2012-03-13 DIAGNOSIS — I10 Essential (primary) hypertension: Secondary | ICD-10-CM | POA: Insufficient documentation

## 2012-03-13 DIAGNOSIS — I2510 Atherosclerotic heart disease of native coronary artery without angina pectoris: Secondary | ICD-10-CM | POA: Insufficient documentation

## 2012-03-13 DIAGNOSIS — E039 Hypothyroidism, unspecified: Secondary | ICD-10-CM | POA: Insufficient documentation

## 2012-03-13 DIAGNOSIS — Z961 Presence of intraocular lens: Secondary | ICD-10-CM | POA: Diagnosis not present

## 2012-03-13 DIAGNOSIS — I5032 Chronic diastolic (congestive) heart failure: Secondary | ICD-10-CM | POA: Diagnosis present

## 2012-03-13 DIAGNOSIS — E785 Hyperlipidemia, unspecified: Secondary | ICD-10-CM | POA: Diagnosis present

## 2012-03-13 DIAGNOSIS — R0602 Shortness of breath: Secondary | ICD-10-CM | POA: Diagnosis not present

## 2012-03-13 DIAGNOSIS — J449 Chronic obstructive pulmonary disease, unspecified: Secondary | ICD-10-CM | POA: Diagnosis present

## 2012-03-13 DIAGNOSIS — R072 Precordial pain: Secondary | ICD-10-CM | POA: Diagnosis not present

## 2012-03-13 HISTORY — DX: Personal history of other diseases of the digestive system: Z87.19

## 2012-03-13 HISTORY — DX: Type 2 diabetes mellitus without complications: E11.9

## 2012-03-13 HISTORY — DX: Shortness of breath: R06.02

## 2012-03-13 HISTORY — DX: Unspecified osteoarthritis, unspecified site: M19.90

## 2012-03-13 HISTORY — DX: Encounter for other specified aftercare: Z51.89

## 2012-03-13 HISTORY — DX: Migraine, unspecified, not intractable, without status migrainosus: G43.909

## 2012-03-13 HISTORY — DX: Polymyalgia rheumatica: M35.3

## 2012-03-13 LAB — COMPREHENSIVE METABOLIC PANEL
ALT: 20 U/L (ref 0–35)
AST: 19 U/L (ref 0–37)
Albumin: 4.3 g/dL (ref 3.5–5.2)
Alkaline Phosphatase: 161 U/L — ABNORMAL HIGH (ref 39–117)
Chloride: 99 mEq/L (ref 96–112)
Potassium: 4 mEq/L (ref 3.5–5.1)
Sodium: 137 mEq/L (ref 135–145)
Total Bilirubin: 0.3 mg/dL (ref 0.3–1.2)

## 2012-03-13 LAB — CBC
MCH: 29.5 pg (ref 26.0–34.0)
MCV: 88.7 fL (ref 78.0–100.0)
Platelets: 322 10*3/uL (ref 150–400)
RDW: 14.5 % (ref 11.5–15.5)

## 2012-03-13 LAB — GLUCOSE, CAPILLARY

## 2012-03-13 LAB — DIFFERENTIAL
Basophils Absolute: 0 10*3/uL (ref 0.0–0.1)
Eosinophils Absolute: 0.1 10*3/uL (ref 0.0–0.7)
Eosinophils Relative: 2 % (ref 0–5)

## 2012-03-13 LAB — CARDIAC PANEL(CRET KIN+CKTOT+MB+TROPI)
Relative Index: 1.6 (ref 0.0–2.5)
Total CK: 217 U/L — ABNORMAL HIGH (ref 7–177)
Troponin I: <0.3 ng/mL (ref <0.30)

## 2012-03-13 MED ORDER — IMIPRAMINE HCL 50 MG PO TABS
100.0000 mg | ORAL_TABLET | Freq: Two times a day (BID) | ORAL | Status: DC
  Administered 2012-03-13 – 2012-03-17 (×8): 100 mg via ORAL
  Filled 2012-03-13 (×11): qty 2

## 2012-03-13 MED ORDER — NITROGLYCERIN 0.4 MG SL SUBL: 0.4000 mg | SUBLINGUAL_TABLET | SUBLINGUAL | Status: DC | PRN

## 2012-03-13 MED ORDER — IPRATROPIUM BROMIDE 0.02 % IN SOLN
0.5000 mg | Freq: Four times a day (QID) | RESPIRATORY_TRACT | Status: DC
  Administered 2012-03-13: 0.5 mg via RESPIRATORY_TRACT
  Filled 2012-03-13 (×2): qty 2.5

## 2012-03-13 MED ORDER — MORPHINE SULFATE 2 MG/ML IJ SOLN: 2.0000 mg | INTRAMUSCULAR | Status: DC | PRN

## 2012-03-13 MED ORDER — IRBESARTAN 150 MG PO TABS
150.0000 mg | ORAL_TABLET | Freq: Every day | ORAL | Status: DC
  Administered 2012-03-13 – 2012-03-17 (×5): 150 mg via ORAL
  Filled 2012-03-13 (×6): qty 1

## 2012-03-13 MED ORDER — CLOPIDOGREL BISULFATE 75 MG PO TABS
75.0000 mg | ORAL_TABLET | Freq: Every day | ORAL | Status: DC
  Administered 2012-03-14 – 2012-03-17 (×4): 75 mg via ORAL
  Filled 2012-03-13 (×4): qty 1

## 2012-03-13 MED ORDER — HEPARIN SODIUM (PORCINE) 5000 UNIT/ML IJ SOLN
5000.0000 [IU] | Freq: Three times a day (TID) | INTRAMUSCULAR | Status: DC
  Administered 2012-03-13 – 2012-03-14 (×2): 5000 [IU] via SUBCUTANEOUS
  Filled 2012-03-13 (×5): qty 1

## 2012-03-13 MED ORDER — ATORVASTATIN CALCIUM 40 MG PO TABS
40.0000 mg | ORAL_TABLET | Freq: Every evening | ORAL | Status: DC
  Administered 2012-03-13 – 2012-03-15 (×3): 40 mg via ORAL
  Filled 2012-03-13 (×5): qty 1

## 2012-03-13 MED ORDER — ENOXAPARIN SODIUM 40 MG/0.4ML ~~LOC~~ SOLN: 40.0000 mg | SUBCUTANEOUS | Status: DC

## 2012-03-13 MED ORDER — ISOSORBIDE MONONITRATE ER 60 MG PO TB24
60.0000 mg | ORAL_TABLET | Freq: Every day | ORAL | Status: DC
  Administered 2012-03-14 – 2012-03-17 (×4): 60 mg via ORAL
  Filled 2012-03-13 (×5): qty 1

## 2012-03-13 MED ORDER — ACETAMINOPHEN 500 MG PO TABS
1000.0000 mg | ORAL_TABLET | Freq: Four times a day (QID) | ORAL | Status: DC | PRN
  Administered 2012-03-15: 1000 mg via ORAL
  Filled 2012-03-13: qty 2

## 2012-03-13 MED ORDER — ATORVASTATIN CALCIUM 40 MG PO TABS: 40.0000 mg | ORAL_TABLET | Freq: Every evening | ORAL | Status: DC

## 2012-03-13 MED ORDER — PANTOPRAZOLE SODIUM 40 MG PO TBEC
40.0000 mg | DELAYED_RELEASE_TABLET | Freq: Every day | ORAL | Status: DC
  Administered 2012-03-14 – 2012-03-17 (×4): 40 mg via ORAL
  Filled 2012-03-13 (×5): qty 1

## 2012-03-13 MED ORDER — LEVOTHYROXINE SODIUM 25 MCG PO TABS
25.0000 ug | ORAL_TABLET | Freq: Every day | ORAL | Status: DC
  Administered 2012-03-14 – 2012-03-17 (×4): 25 ug via ORAL
  Filled 2012-03-13 (×8): qty 1

## 2012-03-13 MED ORDER — ALBUTEROL SULFATE (5 MG/ML) 0.5% IN NEBU
2.5000 mg | INHALATION_SOLUTION | Freq: Four times a day (QID) | RESPIRATORY_TRACT | Status: DC
  Administered 2012-03-13: 2.5 mg via RESPIRATORY_TRACT
  Filled 2012-03-13 (×2): qty 0.5

## 2012-03-13 MED ORDER — METOPROLOL SUCCINATE ER 50 MG PO TB24
50.0000 mg | ORAL_TABLET | Freq: Every day | ORAL | Status: DC
  Administered 2012-03-14 – 2012-03-16 (×3): 50 mg via ORAL
  Filled 2012-03-13 (×4): qty 1

## 2012-03-13 MED ORDER — ONDANSETRON HCL 4 MG/2ML IJ SOLN: 4.0000 mg | Freq: Four times a day (QID) | INTRAMUSCULAR | Status: DC | PRN

## 2012-03-13 MED ORDER — ONDANSETRON HCL 4 MG PO TABS: 4.0000 mg | ORAL_TABLET | Freq: Four times a day (QID) | ORAL | Status: DC | PRN

## 2012-03-13 MED ORDER — GLIMEPIRIDE 1 MG PO TABS
1.0000 mg | ORAL_TABLET | Freq: Every day | ORAL | Status: DC
  Administered 2012-03-14 – 2012-03-15 (×2): 1 mg via ORAL
  Filled 2012-03-13 (×3): qty 1

## 2012-03-13 MED ORDER — FLUTICASONE-SALMETEROL 250-50 MCG/DOSE IN AEPB
1.0000 | INHALATION_SPRAY | Freq: Two times a day (BID) | RESPIRATORY_TRACT | Status: DC
  Administered 2012-03-13 – 2012-03-17 (×7): 1 via RESPIRATORY_TRACT
  Filled 2012-03-13 (×2): qty 14

## 2012-03-13 MED ORDER — TIOTROPIUM BROMIDE MONOHYDRATE 18 MCG IN CAPS
18.0000 ug | ORAL_CAPSULE | Freq: Every day | RESPIRATORY_TRACT | Status: DC
  Administered 2012-03-14: 18 ug via RESPIRATORY_TRACT
  Filled 2012-03-13 (×3): qty 5

## 2012-03-13 MED ORDER — ADULT MULTIVITAMIN W/MINERALS CH
1.0000 | ORAL_TABLET | Freq: Every day | ORAL | Status: DC
  Administered 2012-03-14 – 2012-03-16 (×3): 1 via ORAL
  Filled 2012-03-13 (×3): qty 1

## 2012-03-13 NOTE — Progress Notes (Signed)
Report called to Nurse on 3700.  Pt was transferred to [id number] via wheelchair.  Angelina Ok, RN 03/13/2012 4:34 PM.

## 2012-03-13 NOTE — H&P (Signed)
Hospital Admission Note Date: 03/14/2012  Patient name: Christine [name] Medical record number: [id number] Date of birth: 01/24/1934 Age: 78 y.o. Gender: female PCP: Bethel Born, MD, MD  Medical Service: Internal Medicine Teaching Service  Attending physician:  Dr. Josem Kaufmann   1st Contact:  Dr. Bosie Clos  [phone] 2nd Contact:  Dr. Anselm Jungling   [phone] After 5 pm or weekends: 1st Contact:      Pager: [phone] 2nd Contact:      Pager: [phone]  Chief Complaint:   History of Present Illness:[name] is a 78 yo Woman with PMH of hypothyroidism, DM II (HbA1c 12/2011 was 6.8), HLP, migraine headache, HTN, GERD, CAD with cardiac cath 08/2011 (non flow limiting disease but 40-50% obstruction distal left main) presents to clinic for evaluation of her exertional dyspnea and fatigue and chest pain.  Patient was also seen in clinic on 4/10 for similar complaints and her PCP referred patient back to her cardiologist, Dr. Shirlee Latch in which Imdur was increased from 30mg  to 60mg  and Metoprolol was decreased from 100 to 50mg .    Patient could not really describe her pain and was being vague stating that "my left chest hurts" and it is associated with minimal exertion.  Her symptoms are intermittent.  It has been going on for past 3-4 weeks worse in the past 2 days. Reduced exercise capacity to the point that she is not able to walk up the stairs at home.  Pain radiates to her  left & right arm pain and pain in the back of the neck. She describes this as her typical anginal pain.  She did not take nitroglycerin because she did not want to get a migraine headache.  She endorses a cough without any sputum. She denies any leg edema, paroxysmal nocturnal dyspnea, lightheadedness, resting chest pain.  She sleeps on 2 pillows at night.  She denies any nausea, vomiting, diaphoresis, fever, or chills.   Meds: Medications Prior to Admission  Medication Sig Dispense Refill  . acetaminophen (TYLENOL) 500 MG tablet Take 1,000  mg by mouth every 6 (six) hours as needed. For pain      . atorvastatin (LIPITOR) 40 MG tablet Take 40 mg by mouth every evening.      . clopidogrel (PLAVIX) 75 MG tablet Take 75 mg by mouth daily.      . diphenhydramine-acetaminophen (TYLENOL PM) 25-500 MG TABS Take 1 tablet by mouth at bedtime as needed. For pain/sleep      . esomeprazole (NEXIUM) 40 MG capsule Take 40 mg by mouth daily before breakfast.      . Fluticasone-Salmeterol (ADVAIR DISKUS) 250-50 MCG/DOSE AEPB Inhale 1 puff into the lungs every 12 (twelve) hours.      Marland Kitchen glimepiride (AMARYL) 1 MG tablet Take 1 mg by mouth daily before breakfast.      . imipramine (TOFRANIL) 50 MG tablet Take 100 mg by mouth 2 (two) times daily.      . isosorbide mononitrate (IMDUR) 30 MG 24 hr tablet Take 60 mg by mouth daily.      Marland Kitchen levothyroxine (SYNTHROID, LEVOTHROID) 25 MCG tablet Take 25 mcg by mouth daily.      Marland Kitchen loratadine (CLARITIN) 10 MG tablet Take 10 mg by mouth daily as needed. For allergies      . metoprolol succinate (TOPROL-XL) 50 MG 24 hr tablet Take 50 mg by mouth daily. Take with or immediately following a meal.      . Multiple Vitamin (MULITIVITAMIN WITH MINERALS) TABS Take 1 tablet by  mouth daily.      . nitroGLYCERIN (NITROSTAT) 0.4 MG SL tablet Place 0.4 mg under the tongue every 5 (five) minutes as needed. For chest pain      . valsartan (DIOVAN) 160 MG tablet Take 160 mg by mouth daily.      . vitamin C (ASCORBIC ACID) 500 MG tablet Take 500 mg by mouth daily.      Marland Kitchen DISCONTD: atorvastatin (LIPITOR) 40 MG tablet Take 1 tablet (40 mg total) by mouth every evening.  90 tablet  4  . DISCONTD: clopidogrel (PLAVIX) 75 MG tablet Take 1 tablet (75 mg total) by mouth daily.  30 tablet  4  . DISCONTD: esomeprazole (NEXIUM) 40 MG capsule Take 1 capsule (40 mg total) by mouth daily before breakfast.  90 capsule  4  . DISCONTD: glimepiride (AMARYL) 1 MG tablet Take 1 tablet (1 mg total) by mouth daily before breakfast.  90 tablet  3  .  DISCONTD: isosorbide mononitrate (IMDUR) 30 MG 24 hr tablet Take 2 tablets (60 mg total) by mouth daily.  60 tablet  2  . DISCONTD: metoprolol succinate (TOPROL XL) 50 MG 24 hr tablet Take 1 tablet (50 mg total) by mouth daily. Take with or immediately following a meal.      . DISCONTD: valsartan (DIOVAN) 160 MG tablet Take 1 tablet (160 mg total) by mouth daily.  90 tablet  4  . DISCONTD: imipramine (TOFRANIL) 50 MG tablet 2 tablet in morning and 2 tablet in evening  120 tablet  1    Allergies: Allergies as of 03/13/2012 - Review Complete 03/13/2012  Allergen Reaction Noted  . Aspirin Swelling and Other (See Comments)   . Codeine Nausea And Vomiting   . Penicillins Nausea And Vomiting 12/29/2011   Past Medical History  Diagnosis Date  . GERD (gastroesophageal reflux disease)   . Hypertension   . Hypothyroidism   . Anemia   . Hyperlipidemia   . Sinus arrhythmia   . History of migraine headaches     Next atypical symptoms in the past. Patient was started on Neurontin for possible neuropathic origin of her pain.  Marland Kitchen COPD (chronic obstructive pulmonary disease)   . Heart murmur, systolic     2-D echo in December 2011 showed a normal EF with grade 1 diastolic dysfunction, trivial pulmonary regurgitation and mildly elevated PA pressure at 37 mmHg probably secondary to her COPD.dynamic obstruction-mid cavity obliteration,   . CAD (coronary artery disease)     LHC 08/23/11: dLM 40-50%, oRI 40%, oCFX 40%, oD1 70% (small and not amenable to PCI).  LM lesion did not appear to be flow limiting.  Medical rx was recommended.  Echo 08/23/11: mild LVH, EF 60-65%, grade 1 diast dysfxn, mild BAE, PASP 24.    . Carotid stenosis     dopplers 10/12:  0-39% bilat ICA (repeat 08/2012)  . Migraines   . Heart murmur   . Angina   . Chronic bronchitis   . Shortness of breath on exertion   . Type II diabetes mellitus     On oral hypoglycemic agents.  . Blood transfusion   . History of stomach ulcers 1970's    . Arthritis   . Polymyalgia rheumatica    Past Surgical History  Procedure Date  . Vaginal hysterectomy 1976     total abdominal hysterectomy for reasons unknown  . Cataract extraction w/ intraocular lens  implant, bilateral 1990's   Family History  Problem Relation Age of Onset  .  Cancer Mother 71     cancer of the colon  . COPD Father 67   History   Social History  . Marital Status: Widowed    Spouse Name: N/A    Number of Children: N/A  . Years of Education: N/A   Occupational History  . Not on file.   Social History Main Topics  . Smoking status: Never Smoker   . Smokeless tobacco: Never Used  . Alcohol Use: Yes     "Last drink 1967"  . Drug Use: No  . Sexually Active: No   Other Topics Concern  . Not on file   Social History Narrative    Never smoked. Care for her daughter who has had a lung transplant. 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  HEENT: Denies photophobia, eye pain, redness, hearing loss, ear pain, congestion, sore throat, rhinorrhea, sneezing, mouth sores, trouble swallowing, +neck pain, neck stiffness and tinnitus.  Respiratory:++ SOB,++ DOE, ++cough, no chest tightness, and no wheezing.  Cardiovascular: + +chest pain,  No palpitations and no leg swelling.  Gastrointestinal: Denies nausea, vomiting, abdominal pain, diarrhea, constipation, blood in stool and abdominal distention.  Genitourinary: Denies dysuria, urgency, frequency, hematuria, flank pain and difficulty urinating.  Musculoskeletal: Denies myalgias, back pain, joint swelling, arthralgias and gait problem.++right and left shoulder pain  Skin: Denies pallor, rash and wound.  Neurological: Denies dizziness, seizures, syncope, weakness, light-headedness, numbness and +headaches.  Hematological: Denies adenopathy. Easy  bruising, personal or family bleeding history  Psychiatric/Behavioral: Denies suicidal ideation, mood changes, confusion, nervousness, sleep disturbance and agitation    Physical Exam: Blood pressure 146/92, pulse 84, temperature 98.6 F (37 C), resp. rate 14, height 5\' 5"  (1.651 m), weight 135 lb 8 oz (61.462 kg), last menstrual period 02/10/1966, SpO2 100.00%. General: alert, well-developed, and cooperative to examination.  Head: normocephalic and atraumatic.  Eyes: vision grossly intact, pupils equal, pupils round, pupils reactive to light, no injection and anicteric.  Mouth: pharynx pink and moist, no erythema, and no exudates.  Neck: supple, full ROM, no thyromegaly, no JVD, and no carotid bruits.  Lungs: normal respiratory effort, no accessory muscle use, normal breath sounds, no crackles, and no wheezes. Heart: distant heart sounds,normal rate, regular rhythm, no murmur, no gallop, and no rub. Tenderness to palpation to her left chest/breast area. Abdomen: soft, non-tender, normal bowel sounds, no distention, no guarding, no rebound tenderness Msk: no joint swelling, no joint warmth, and no redness over joints.  Pulses: 2+ DP/PT pulses bilaterally Extremities: No cyanosis, clubbing, edema Neurologic: alert & oriented X3, cranial nerves II-XII intact, strength normal in all extremities, sensation intact to light touch, and gait normal.  Skin: turgor normal and no rashes.  Psych: Oriented X3, memory intact for recent and remote, normally interactive, good eye contact, not anxious appearing, and ++ depressed appearing.   Lab results: Basic Metabolic Panel:  [facility] 03/13/12 1809  NA 137  K 4.0  CL 99  CO2 29  GLUCOSE 100*  BUN 15  CREATININE 1.07  CALCIUM 10.2  MG --  PHOS --   Liver Function Tests:  [facility] 03/13/12 1809  AST 19  ALT 20  ALKPHOS 161*  BILITOT 0.3  PROT 8.0  ALBUMIN 4.3   CBC:  Basename 03/13/12 1809  WBC 6.1  NEUTROABS 3.2  HGB 12.5  HCT  37.6  MCV 88.7  PLT 322   Cardiac Enzymes:  Basename 03/14/12 0230 03/13/12 1809  CKTOTAL 205* 217*  CKMB 3.0 3.4  CKMBINDEX -- --  TROPONINI <0.30 <0.30   BNP:  Basename 03/13/12 1809  PROBNP 115.3   CBG:  Basename 03/14/12 0737 03/13/12 1758 03/13/12 1516  GLUCAP 105* 86 106*   Hemoglobin A1C:  Basename 03/13/12 1809  HGBA1C 6.8*   Thyroid Function Tests:  Basename 03/13/12 1809  TSH 2.168  T4TOTAL --  FREET4 --  T3FREE --  THYROIDAB --    Imaging results:  Dg Chest 2 View  03/13/2012  *RADIOLOGY REPORT*  Clinical Data: Shortness of breath and chest pain  CHEST - 2 VIEW  Comparison: 01/30/2012  Findings: The heart size and mediastinal contours are within normal limits. Both lungs are clear.  The visualized skeletal structures are unremarkable.  IMPRESSION: Negative exam.  Original Report Authenticated By: Rosealee Albee, M.D.    Other results: EKG: unchanged from previous EKGs  Assessment & Plan by Problem:  1. Chest pain/SOB: Differential diagnosis include diastolic CHF and ACS vs. Musculoskeletal.  Musculoskeletal is possible given the tenderness to palpation on physical exam; however, chest pain and SOB is concerning for ACS given her 40-50% obstruction of distal left main seen on cardiac cath in 08/2011.  CHF is also on our differential since she did have grade I distolic dysfunction on echocardiogram but her physical exam did not show volume overload and no JVD, weight has remained stable between 135-140 pounds.  Her SOB could also be due to her COPD exacerbation, which is less likely as there is no wheezing and no respiratory distress and she is satting well on room air. -Plan -Admit to telemetry -Get stat EKG, CEs x 3 q8hrs, TSH -Will repeat 2D echocardiogram since her l-ast Echo was in 2011 which showed EF 65%, with dynamic obstruction, mid-cavity obliteration, grade I diastolic dysfunction. -Will check proBNP -Get chest Xray -Will inform Dr. Shirlee Latch  and get his inputs -Lipid panel was done in 01/2012: LDL 49, will continue statin -Will continue BB, ARB, Imdur -NTG SL and/or Morphine PRN chest pain -Nebulizers for SOB   2. HYPOTHYROIDISM-stable, Last TSH was 2.72 in 01/30/2012.  -Will continue current dose of Synthroid qAM   3.DIABETES MELLITUS, TYPE II- well controlled, HbA1c was 6.8 in 12/2011. Will continue Glimeperide 1mg  qdaily   4. HYPERTENSION-Well controlled, BP 114/60.  -Will continue current home meds    DVT ppx: Heparin 5000 units SQ TID   Signed: HO,MICHELE 03/14/2012, 8:41 AM

## 2012-03-13 NOTE — Assessment & Plan Note (Signed)
Patient's current symptoms are typical of anginal pain and correlates with her previous chest pain last year when she had a catheterization which showed stenosis of left main circumflex and ostial branch. The patient is on substantial medical therapy and continues to have symptoms. We'll admit the patient for telemetry monitoring and cardiac enzymes. Patient and patient's daughter have been instructed about the hospitalization and expectations. Admitting team contacted. Appreciate their help. Will forward this note the patient's primary cardiologist.

## 2012-03-13 NOTE — Telephone Encounter (Signed)
[facility] on 03/09/12 with C/O of fatigue SOB withj activity also  stabbing pain on left arm. Patient states she has been having the same symptoms she has not seen any  improvement in her symptoms. On the O/V the PA increased her Imdur to 60 mg and decreased Metoprolol to 50 mg and this has not help. Patient was made aware to call  her PCP to see what else is going on with her, or  go to the ER if the symptoms are worse. Patient verbalized understanding.

## 2012-03-13 NOTE — Progress Notes (Signed)
22 Gauge angio cath in left hand.  Angelina Ok, RN 03/13/2012 4;07 PM

## 2012-03-13 NOTE — Progress Notes (Signed)
[name], female   DOB: 01/24/1934, 78 y.o.   MRN: [id number] [name], female   DOB: 01/24/1934, 78 y.o.   MRN: [id number]  Christine [name] Returns for evaluation of her exertional dyspnea and fatigue and chest pain Chest pain is described as dull, nondescript, left-sided chest and upper abdominal discomfort associated with minimal exertion Ongoing for past 3-4 weeks worse in the past 2 days.  Reduced exercise capacity Associated with left arm pain and pain in the back of the neck. She describes this as her typical anginal pain, cough without any sputum No leg edema, paroxysmal nocturnal dyspnea, lightheadedness, constant chest pain , rest chest pain, or other complaints Was seen by her cardiologist office last week at which time Imdur was increased for angina pain but this did not relieve her symptoms. Also her symptoms did not get better after reducing the metoprolol to half suspecting her fatigue was caused by beta blocker  Physical exam   General Appearance:     Filed Vitals:   03/13/12 1503  BP: 114/60  Pulse: 63  Temp: 97 F (36.1 C)  TempSrc: Oral  Height: 5\' 5"  (1.651 m)  Weight: 135 lb 8 oz (61.462 kg)  SpO2: 99%     Alert, cooperative, no distress, appears stated age  Head:    Normocephalic, without obvious abnormality, atraumatic  Eyes:    PERRL, conjunctiva/corneas clear, EOM's intact, fundi    benign, both eyes       Neck:   Supple, symmetrical, trachea midline, no adenopathy;       thyroid:  No enlargement/tenderness/nodules; no carotid   bruit or JVD  Lungs:     Clear to auscultation bilaterally, respirations unlabored  Chest wall:    No tenderness or deformity  Heart:    Regular rate and rhythm, S1 and S2 normal, faint systolic murmur, rub   or gallop  Abdomen:     Soft, non-tender, bowel sounds active all four quadrants,    no masses, no organomegaly  Extremities:   Extremities normal, atraumatic, no cyanosis or edema  Pulses:   2+ and  symmetric all extremities  Skin:   Skin color, texture, turgor normal, no rashes or lesions  Neurologic:  nonfocal grossly    Review of system- as per history of present illness

## 2012-03-13 NOTE — Telephone Encounter (Signed)
Spoke with patient regarding pain on left side of chest and fatigue. She was seen by Jacol[facility] PA 03/09/12,PA increased Imdur to 60 mg . In the middle of the phone call pt's phone went dead. I have attempted to call x 3,  the answer service said that patient is not able to receive calls at this time.

## 2012-03-13 NOTE — Telephone Encounter (Signed)
Follow-up:     Patient called back because her phone is having issues. Please call back.

## 2012-03-13 NOTE — Telephone Encounter (Signed)
Please return call to patient at [phone]  Patient c/o fatigue and other unexplained heart issues.  Please return call to patient at [phone]

## 2012-03-14 ENCOUNTER — Other Ambulatory Visit: Payer: Self-pay | Admitting: Physician Assistant

## 2012-03-14 ENCOUNTER — Other Ambulatory Visit: Payer: Self-pay

## 2012-03-14 DIAGNOSIS — I517 Cardiomegaly: Secondary | ICD-10-CM

## 2012-03-14 DIAGNOSIS — R079 Chest pain, unspecified: Secondary | ICD-10-CM

## 2012-03-14 DIAGNOSIS — I2510 Atherosclerotic heart disease of native coronary artery without angina pectoris: Secondary | ICD-10-CM

## 2012-03-14 LAB — CARDIAC PANEL(CRET KIN+CKTOT+MB+TROPI)
Relative Index: 1.5 (ref 0.0–2.5)
Relative Index: 1.5 (ref 0.0–2.5)
Total CK: 205 U/L — ABNORMAL HIGH (ref 7–177)
Troponin I: <0.3 ng/mL (ref <0.30)

## 2012-03-14 LAB — GLUCOSE, CAPILLARY: Glucose-Capillary: 105 mg/dL — ABNORMAL HIGH (ref 70–99)

## 2012-03-14 LAB — RETICULOCYTES
RBC.: 3.95 MIL/uL (ref 3.87–5.11)
Retic Count, Absolute: 55.3 10*3/uL (ref 19.0–186.0)

## 2012-03-14 MED ORDER — ASPIRIN 81 MG PO CHEW
81.0000 mg | CHEWABLE_TABLET | Freq: Every day | ORAL | Status: DC
  Filled 2012-03-14: qty 1

## 2012-03-14 MED ORDER — DIAZEPAM 5 MG PO TABS
5.0000 mg | ORAL_TABLET | ORAL | Status: AC
  Administered 2012-03-16: 5 mg via ORAL
  Filled 2012-03-14: qty 1

## 2012-03-14 MED ORDER — ASPIRIN 81 MG PO CHEW: 324.0000 mg | CHEWABLE_TABLET | ORAL | Status: DC

## 2012-03-14 MED ORDER — SODIUM CHLORIDE 0.9 % IV SOLN
250.0000 mL | INTRAVENOUS | Status: DC | PRN
  Administered 2012-03-15: 250 mL via INTRAVENOUS

## 2012-03-14 MED ORDER — SODIUM CHLORIDE 0.9 % IV SOLN
INTRAVENOUS | Status: DC
  Administered 2012-03-16: 07:00:00 via INTRAVENOUS

## 2012-03-14 MED ORDER — SODIUM CHLORIDE 0.9 % IJ SOLN
3.0000 mL | Freq: Two times a day (BID) | INTRAMUSCULAR | Status: DC
  Administered 2012-03-16: 3 mL via INTRAVENOUS

## 2012-03-14 MED ORDER — GUAIFENESIN-DM 100-10 MG/5ML PO SYRP: 5.0000 mL | ORAL_SOLUTION | ORAL | Status: DC | PRN

## 2012-03-14 MED ORDER — LORATADINE 10 MG PO TABS
10.0000 mg | ORAL_TABLET | Freq: Once | ORAL | Status: AC
  Administered 2012-03-14: 10 mg via ORAL
  Filled 2012-03-14: qty 1

## 2012-03-14 MED ORDER — HEPARIN BOLUS VIA INFUSION
3000.0000 [IU] | Freq: Once | INTRAVENOUS | Status: AC
  Administered 2012-03-14: 3000 [IU] via INTRAVENOUS
  Filled 2012-03-14: qty 3000

## 2012-03-14 MED ORDER — HEPARIN (PORCINE) IN NACL 100-0.45 UNIT/ML-% IJ SOLN
600.0000 [IU]/h | INTRAMUSCULAR | Status: DC
  Administered 2012-03-14: 750 [IU]/h via INTRAVENOUS
  Administered 2012-03-14 – 2012-03-15 (×2): 600 [IU]/h via INTRAVENOUS
  Filled 2012-03-14 (×3): qty 250

## 2012-03-14 MED ORDER — SODIUM CHLORIDE 0.9 % IJ SOLN: 3.0000 mL | INTRAMUSCULAR | Status: DC | PRN

## 2012-03-14 MED ORDER — IPRATROPIUM BROMIDE 0.02 % IN SOLN
0.5000 mg | Freq: Four times a day (QID) | RESPIRATORY_TRACT | Status: DC | PRN
  Filled 2012-03-14: qty 2.5

## 2012-03-14 MED ORDER — ALBUTEROL SULFATE (5 MG/ML) 0.5% IN NEBU
2.5000 mg | INHALATION_SOLUTION | Freq: Four times a day (QID) | RESPIRATORY_TRACT | Status: DC | PRN
  Filled 2012-03-14: qty 0.5

## 2012-03-14 MED ORDER — DIAZEPAM 5 MG PO TABS: 5.0000 mg | ORAL_TABLET | ORAL | Status: DC

## 2012-03-14 NOTE — Progress Notes (Signed)
  Echocardiogram 2D Echocardiogram has been performed.  Mercy Moore 03/14/2012, 11:01 AM

## 2012-03-14 NOTE — Progress Notes (Addendum)
Subjective: She feels much better this morning, her SOB and chest pain improve overnight Objective: Vital signs in last 24 hours: Filed Vitals:   03/13/12 1700 03/13/12 2100 03/14/12 0600 03/14/12 0700  BP:  142/74 146/92   Pulse:  65 84   Temp:  98.6 F (37 C)    Resp:  16 14   Height: 5\' 5"  (1.651 m)     Weight: 135 lb 8 oz (61.462 kg)     SpO2:  98% 100% 100%   Weight change:   Intake/Output Summary (Last 24 hours) at 03/14/12 1121 Last data filed at 03/14/12 1000  Gross per 24 hour  Intake    240 ml  Output      0 ml  Net    240 ml   Physical Exam: General: alert, well-developed, and cooperative to examination.  Neck: supple, full ROM, no thyromegaly, no JVD, and no carotid bruits.  Lungs: normal respiratory effort, no accessory muscle use, normal breath sounds, no crackles, and no wheezes. Heart: distant heart sounds,normal rate, regular rhythm, 1/6 systolic murmur, no gallop, and no rub.  Abdomen: soft, non-tender, normal bowel sounds, no distention, no guarding, no rebound tenderness Msk: no joint swelling, no joint warmth, and no redness over joints.  Pulses: 2+ DP/PT pulses bilaterally Extremities: No cyanosis, clubbing, edema Neurologic: alert & oriented X3, cranial nerves II-XII intact, strength normal in all extremities, sensation intact to light touch, and gait normal.   Lab Results: Basic Metabolic Panel:  Lab 03/13/12 4098  NA 137  K 4.0  CL 99  CO2 29  GLUCOSE 100*  BUN 15  CREATININE 1.07  CALCIUM 10.2  MG --  PHOS --   Liver Function Tests:  Lab 03/13/12 1809  AST 19  ALT 20  ALKPHOS 161*  BILITOT 0.3  PROT 8.0  ALBUMIN 4.3   CBC:  Lab 03/13/12 1809  WBC 6.1  NEUTROABS 3.2  HGB 12.5  HCT 37.6  MCV 88.7  PLT 322   Cardiac Enzymes:  Lab 03/14/12 0230 03/13/12 1809  CKTOTAL 205* 217*  CKMB 3.0 3.4  CKMBINDEX -- --  TROPONINI <0.30 <0.30   BNP:  Lab 03/13/12 1809  PROBNP 115.3  CBG:  Lab 03/14/12 0737 03/13/12 1758  03/13/12 1516  GLUCAP 105* 86 106*   Hemoglobin A1C:  Lab 03/13/12 1809  HGBA1C 6.8*   Thyroid Function Tests:  Lab 03/13/12 1809  TSH 2.168  T4TOTAL --  FREET4 --  T3FREE --  THYROIDAB --   Anemia Panel:  Lab 03/14/12 0955  VITAMINB12 --  FOLATE --  FERRITIN --  TIBC --  IRON --  RETICCTPCT 1.4    Medications: Scheduled Meds:   . atorvastatin  40 mg Oral QPM  . clopidogrel  75 mg Oral Daily  . Fluticasone-Salmeterol  1 puff Inhalation Q12H  . glimepiride  1 mg Oral QAC breakfast  . heparin subcutaneous  5,000 Units Subcutaneous Q8H  . imipramine  100 mg Oral BID  . irbesartan  150 mg Oral Daily  . isosorbide mononitrate  60 mg Oral Daily  . levothyroxine  25 mcg Oral Daily  . metoprolol succinate  50 mg Oral Daily  . mulitivitamin with minerals  1 tablet Oral Daily  . pantoprazole  40 mg Oral Daily  . DISCONTD: albuterol  2.5 mg Nebulization Q6H  . DISCONTD: atorvastatin  40 mg Oral QPM  . DISCONTD: enoxaparin  40 mg Subcutaneous Q24H  . DISCONTD: ipratropium  0.5 mg Nebulization Q6H  .  DISCONTD: tiotropium  18 mcg Inhalation Daily   Continuous Infusions:  PRN Meds:.acetaminophen, albuterol, ipratropium, morphine injection, nitroGLYCERIN, ondansetron (ZOFRAN) IV, ondansetron Assessment/Plan: 1. Chest pain/SOB: Differential diagnosis include diastolic CHF and ACS vs. Musculoskeletal. Given her hx of chest pain in the past several weeks, unstable angina is concerning.  CEs x 3 have been negative.  EKG did not show any significant change from previous EKGs. TSH was wnl. ProBNP 115.3 and CXR did not show any congestion which makes CHF less likely.   -Repeat 2D echo pending -Consulted cardiology -Lipid panel was done in 01/2012: LDL 49, will continue statin  -Will continue BB, ARB, Imdur  -NTG SL and/or Morphine PRN chest pain  -Nebulizers for SOB   2. HYPOTHYROIDISM-stable, Last TSH was 2.72 in 01/30/2012.  -Will continue current dose of Synthroid qAM    3.DIABETES MELLITUS, TYPE II- well controlled, HbA1c was 6.8 in 12/2011. Will continue Glimeperide 1mg  qdaily   4. HYPERTENSION- adequately controlled, BP 146/92  -Will continue current home meds   DVT ppx: Heparin 5000 units SQ TID   LOS: 1 day   Christine [name] 03/14/2012, 11:21 AM

## 2012-03-14 NOTE — Progress Notes (Signed)
ANTICOAGULATION CONSULT NOTE - Follow Up Consult  Pharmacy Consult for heparin Indication: chest pain/ACS  Labs:  Basename 03/14/12 2200 03/14/12 0935 03/14/12 0230 03/13/12 1809  HGB -- -- -- 12.5  HCT -- -- -- 37.6  PLT -- -- -- 322  APTT -- -- -- --  LABPROT -- -- -- --  INR -- -- -- --  HEPARINUNFRC 0.94* -- -- --  CREATININE -- -- -- 1.07  CKTOTAL -- 175 205* 217*  CKMB -- 2.7 3.0 3.4  TROPONINI -- <0.30 <0.30 <0.30    Assessment: 78yo female supratherapeutic on heparin with initial dosing for CP, had been running at 700 units/hr instead of ordered 750/hr, lab drawn properly, may be due to bolus.  Goal of Therapy:  Heparin level 0.3-0.7 units/ml   Plan:  Will decrease heparin by ~2 units/kg/hr to 600 units/hr and check level in 8hr.  Colleen Can PharmD BCPS 03/14/2012,10:36 PM

## 2012-03-14 NOTE — Progress Notes (Signed)
ANTICOAGULATION CONSULT NOTE - Initial Consult  Pharmacy Consult for heparin Indication: chest pain/ACS  Allergies  Allergen Reactions  . Aspirin Swelling and Other (See Comments)    REACTION: Angioedema  . Codeine Nausea And Vomiting    "makes me deathly sick"  . Penicillins Nausea And Vomiting    "makes me go to the bathroom real bad"    Patient Measurements: Height: 5\' 5"  (165.1 cm) Weight: 135 lb 8 oz (61.462 kg) (from 03/13/12 @15 :03) IBW/kg (Calculated) : 57  Heparin Dosing Weight: 61.5 kg  Vital Signs: BP: 155/77 mmHg (04/27 1100) Pulse Rate: 84  (04/27 0600)  Labs:  Basename 03/14/12 0935 03/14/12 0230 03/13/12 1809  HGB -- -- 12.5  HCT -- -- 37.6  PLT -- -- 322  APTT -- -- --  LABPROT -- -- --  INR -- -- --  HEPARINUNFRC -- -- --  CREATININE -- -- 1.07  CKTOTAL 175 205* 217*  CKMB 2.7 3.0 3.4  TROPONINI <0.30 <0.30 <0.30   Estimated Creatinine Clearance: 40.2 ml/min (by C-G formula based on Cr of 1.07).  Medical History: Past Medical History  Diagnosis Date  . GERD (gastroesophageal reflux disease)   . Hypertension   . Hypothyroidism   . Anemia   . Hyperlipidemia   . Sinus arrhythmia   . History of migraine headaches     Next atypical symptoms in the past. Patient was started on Neurontin for possible neuropathic origin of her pain.  Marland Kitchen COPD (chronic obstructive pulmonary disease)   . Heart murmur, systolic     2-D echo in December 2011 showed a normal EF with grade 1 diastolic dysfunction, trivial pulmonary regurgitation and mildly elevated PA pressure at 37 mmHg probably secondary to her COPD.dynamic obstruction-mid cavity obliteration,   . CAD (coronary artery disease)     LHC 08/23/11: dLM 40-50%, oRI 40%, oCFX 40%, oD1 70% (small and not amenable to PCI).  LM lesion did not appear to be flow limiting.  Medical rx was recommended.  Echo 08/23/11: mild LVH, EF 60-65%, grade 1 diast dysfxn, mild BAE, PASP 24.    . Carotid stenosis     dopplers 10/12:   0-39% bilat ICA (repeat 08/2012)  . Migraines   . Heart murmur   . Angina   . Chronic bronchitis   . Shortness of breath on exertion   . Type II diabetes mellitus     On oral hypoglycemic agents.  . Blood transfusion   . History of stomach ulcers 1970's  . Arthritis   . Polymyalgia rheumatica     Medications:  Prescriptions prior to admission  Medication Sig Dispense Refill  . acetaminophen (TYLENOL) 500 MG tablet Take 1,000 mg by mouth every 6 (six) hours as needed. For pain      . atorvastatin (LIPITOR) 40 MG tablet Take 40 mg by mouth every evening.      . clopidogrel (PLAVIX) 75 MG tablet Take 75 mg by mouth daily.      . diphenhydramine-acetaminophen (TYLENOL PM) 25-500 MG TABS Take 1 tablet by mouth at bedtime as needed. For pain/sleep      . esomeprazole (NEXIUM) 40 MG capsule Take 40 mg by mouth daily before breakfast.      . Fluticasone-Salmeterol (ADVAIR DISKUS) 250-50 MCG/DOSE AEPB Inhale 1 puff into the lungs every 12 (twelve) hours.      Marland Kitchen glimepiride (AMARYL) 1 MG tablet Take 1 mg by mouth daily before breakfast.      . imipramine (TOFRANIL) 50 MG  tablet Take 100 mg by mouth 2 (two) times daily.      . isosorbide mononitrate (IMDUR) 30 MG 24 hr tablet Take 60 mg by mouth daily.      Marland Kitchen levothyroxine (SYNTHROID, LEVOTHROID) 25 MCG tablet Take 25 mcg by mouth daily.      Marland Kitchen loratadine (CLARITIN) 10 MG tablet Take 10 mg by mouth daily as needed. For allergies      . metoprolol succinate (TOPROL-XL) 50 MG 24 hr tablet Take 50 mg by mouth daily. Take with or immediately following a meal.      . Multiple Vitamin (MULITIVITAMIN WITH MINERALS) TABS Take 1 tablet by mouth daily.      . nitroGLYCERIN (NITROSTAT) 0.4 MG SL tablet Place 0.4 mg under the tongue every 5 (five) minutes as needed. For chest pain      . valsartan (DIOVAN) 160 MG tablet Take 160 mg by mouth daily.      . vitamin C (ASCORBIC ACID) 500 MG tablet Take 500 mg by mouth daily.      Marland Kitchen DISCONTD: atorvastatin  (LIPITOR) 40 MG tablet Take 1 tablet (40 mg total) by mouth every evening.  90 tablet  4  . DISCONTD: clopidogrel (PLAVIX) 75 MG tablet Take 1 tablet (75 mg total) by mouth daily.  30 tablet  4  . DISCONTD: esomeprazole (NEXIUM) 40 MG capsule Take 1 capsule (40 mg total) by mouth daily before breakfast.  90 capsule  4  . DISCONTD: glimepiride (AMARYL) 1 MG tablet Take 1 tablet (1 mg total) by mouth daily before breakfast.  90 tablet  3  . DISCONTD: isosorbide mononitrate (IMDUR) 30 MG 24 hr tablet Take 2 tablets (60 mg total) by mouth daily.  60 tablet  2  . DISCONTD: metoprolol succinate (TOPROL XL) 50 MG 24 hr tablet Take 1 tablet (50 mg total) by mouth daily. Take with or immediately following a meal.      . DISCONTD: valsartan (DIOVAN) 160 MG tablet Take 1 tablet (160 mg total) by mouth daily.  90 tablet  4  . DISCONTD: imipramine (TOFRANIL) 50 MG tablet 2 tablet in morning and 2 tablet in evening  120 tablet  1    Assessment: 78 yo F to start heparin for chest pain/ ACS.  Her cardiac enzymes are negative so far.  Admission EKG NSR with no changes.  Wt 61.5 kg. CBC wnl. Plan is cardiac cath. Goal of Therapy:  Heparin level 0.3-0.7 units/ml   Plan:  3000 units bolus, 750 units/hr, check 8 hr heparin level. Daily HL and CBC.  Herby Abraham, Pharm.D. [phone] 03/14/2012 12:14 PM

## 2012-03-14 NOTE — Consult Note (Signed)
CARDIOLOGY CONSULT NOTE  [name] MRN: [id number] DOB/AGE: 76/06/1934 76 y.o.  Admit date: 03/13/2012 Referring Physician Internal Medicine Teaching Service  Primary Cardiologist Marca Ancona, MD  Reason for Consultation Chest pain  HPI: Patient is a 78 year old female, with previously documented CAD, status post prior cardiac catheterization, October 2012, yielding 40-50% eccentric distal LM stenosis; 70-75% ostial DX 1, but which was not amenable to PCI. LVD deferred, secondary to CKD (normal LVF, by 2-D echo). Medical therapy was recommended.  Patient now admitted with worsening CP over the past few days, with her worst episode occurring this past Thursday. She has also developed worsening DOE and generalized fatigue. She denies symptoms suggestive of CHF. She denies any CP at rest. Her episode on Thursday (7/10) was her worst, to date. She reported radiation to the left bicep, but denied any associated diaphoresis or nausea. Patient took 1 NTG tablet, with prompt relief in less than 5 minutes. She currently denies any residual discomfort.  Serial cardiac markers are negative, thus far. Admission EKG indicates NSR with no acute changes.  Review of systems complete and found to be negative unless listed above  Past Medical History  Diagnosis Date  . GERD (gastroesophageal reflux disease)   . Hypertension   . Hypothyroidism   . Anemia   . Hyperlipidemia   . Sinus arrhythmia   . History of migraine headaches     Next atypical symptoms in the past. Patient was started on Neurontin for possible neuropathic origin of her pain.  Marland Kitchen COPD (chronic obstructive pulmonary disease)   . Heart murmur, systolic     2-D echo in December 2011 showed a normal EF with grade 1 diastolic dysfunction, trivial pulmonary regurgitation and mildly elevated PA pressure at 37 mmHg probably secondary to her COPD.dynamic obstruction-mid cavity obliteration,   . CAD (coronary artery disease)    LHC 08/23/11: dLM 40-50%, oRI 40%, oCFX 40%, oD1 70% (small and not amenable to PCI).  LM lesion did not appear to be flow limiting.  Medical rx was recommended.  Echo 08/23/11: mild LVH, EF 60-65%, grade 1 diast dysfxn, mild BAE, PASP 24.    . Carotid stenosis     dopplers 10/12:  0-39% bilat ICA (repeat 08/2012)  . Migraines   . Heart murmur   . Angina   . Chronic bronchitis   . Shortness of breath on exertion   . Type II diabetes mellitus     On oral hypoglycemic agents.  . Blood transfusion   . History of stomach ulcers 1970's  . Arthritis   . Polymyalgia rheumatica     Family History  Problem Relation Age of Onset  . Cancer Mother 63     cancer of the colon  . COPD Father 4    History   Social History  . Marital Status: Widowed    Spouse Name: N/A    Number of Children: N/A  . Years of Education: N/A   Occupational History  . Not on file.   Social History Main Topics  . Smoking status: Never Smoker   . Smokeless tobacco: Never Used  . Alcohol Use: Yes     "Last drink 1967"  . Drug Use: No  . Sexually Active: No   Other Topics Concern  . Not on file   Social History Narrative    Never smoked. Care for her daughter who has had a lung transplant. 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 Vaginal hysterectomy 1976     total abdominal hysterectomy for reasons unknown  . Cataract extraction w/ intraocular lens  implant, bilateral 1990's     Prescriptions prior to admission  Medication Sig Dispense Refill  . acetaminophen (TYLENOL) 500 MG tablet Take 1,000 mg by mouth every 6 (six) hours as needed. For pain      . atorvastatin (LIPITOR) 40 MG tablet Take 40 mg by mouth every evening.      . clopidogrel (PLAVIX) 75 MG tablet Take 75 mg by mouth daily.      . diphenhydramine-acetaminophen (TYLENOL PM) 25-500 MG TABS Take 1 tablet by mouth at bedtime as needed. For pain/sleep      .  esomeprazole (NEXIUM) 40 MG capsule Take 40 mg by mouth daily before breakfast.      . Fluticasone-Salmeterol (ADVAIR DISKUS) 250-50 MCG/DOSE AEPB Inhale 1 puff into the lungs every 12 (twelve) hours.      Marland Kitchen glimepiride (AMARYL) 1 MG tablet Take 1 mg by mouth daily before breakfast.      . imipramine (TOFRANIL) 50 MG tablet Take 100 mg by mouth 2 (two) times daily.      . isosorbide mononitrate (IMDUR) 30 MG 24 hr tablet Take 60 mg by mouth daily.      Marland Kitchen levothyroxine (SYNTHROID, LEVOTHROID) 25 MCG tablet Take 25 mcg by mouth daily.      Marland Kitchen loratadine (CLARITIN) 10 MG tablet Take 10 mg by mouth daily as needed. For allergies      . metoprolol succinate (TOPROL-XL) 50 MG 24 hr tablet Take 50 mg by mouth daily. Take with or immediately following a meal.      . Multiple Vitamin (MULITIVITAMIN WITH MINERALS) TABS Take 1 tablet by mouth daily.      . nitroGLYCERIN (NITROSTAT) 0.4 MG SL tablet Place 0.4 mg under the tongue every 5 (five) minutes as needed. For chest pain      . valsartan (DIOVAN) 160 MG tablet Take 160 mg by mouth daily.      . vitamin C (ASCORBIC ACID) 500 MG tablet Take 500 mg by mouth daily.      Marland Kitchen DISCONTD: atorvastatin (LIPITOR) 40 MG tablet Take 1 tablet (40 mg total) by mouth every evening.  90 tablet  4  . DISCONTD: clopidogrel (PLAVIX) 75 MG tablet Take 1 tablet (75 mg total) by mouth daily.  30 tablet  4  . DISCONTD: esomeprazole (NEXIUM) 40 MG capsule Take 1 capsule (40 mg total) by mouth daily before breakfast.  90 capsule  4  . DISCONTD: glimepiride (AMARYL) 1 MG tablet Take 1 tablet (1 mg total) by mouth daily before breakfast.  90 tablet  3  . DISCONTD: isosorbide mononitrate (IMDUR) 30 MG 24 hr tablet Take 2 tablets (60 mg total) by mouth daily.  60 tablet  2  . DISCONTD: metoprolol succinate (TOPROL XL) 50 MG 24 hr tablet Take 1 tablet (50 mg total) by mouth daily. Take with or immediately following a meal.      . DISCONTD: valsartan (DIOVAN) 160 MG tablet Take 1 tablet  (160 mg total) by mouth daily.  90 tablet  4  . DISCONTD: imipramine (TOFRANIL) 50 MG tablet 2 tablet in morning and 2 tablet in evening  120 tablet  1    Physical Exam: Blood pressure 155/77, pulse 84, temperature 98.6 F (37 C), resp. rate 14, height 5\' 5"  (1.651 m),  weight 135 lb 8 oz (61.462 kg), last menstrual period 02/10/1966, SpO2 100.00%.    GENERAL: 78 year old female; NAD HEENT: NCAT, PERRLA, EOMI; sclera clear; no xanthelasma NECK: palpable bilateral carotid pulses, no bruits; no JVD; no TM LUNGS: CTA bilaterally CARDIAC: RRR (S1, S2); no significant murmurs; no rubs or gallops ABDOMEN: soft, non-tender; intact BS EXTREMETIES: intact distal pulses; no significant peripheral edema SKIN: warm/dry; no obvious rash/lesions MUSCULOSKELETAL: no joint deformity NEURO: no focal deficit; NL affect  Labs:   Lab Results  Component Value Date   WBC 6.1 03/13/2012   HGB 12.5 03/13/2012   HCT 37.6 03/13/2012   MCV 88.7 03/13/2012   PLT 322 03/13/2012    Lab 03/13/12 1809  NA 137  K 4.0  CL 99  CO2 29  BUN 15  CREATININE 1.07  CALCIUM 10.2  PROT 8.0  BILITOT 0.3  ALKPHOS 161*  ALT 20  AST 19  GLUCOSE 100*   Lab Results  Component Value Date   CKTOTAL 175 03/14/2012   CKMB 2.7 03/14/2012   TROPONINI <0.30 03/14/2012    Lab Results  Component Value Date   CHOL 121 01/27/2012   CHOL 159 08/23/2011   CHOL 153 04/09/2011   Lab Results  Component Value Date   HDL 41.90 01/27/2012   HDL 44 08/23/2011   HDL 46 1/61/0960   Lab Results  Component Value Date   LDLCALC 49 01/27/2012   LDLCALC 79 08/23/2011   LDLCALC 82 04/09/2011   Lab Results  Component Value Date   TRIG 150.0* 01/27/2012   TRIG 179* 08/23/2011   TRIG 123 04/09/2011   Lab Results  Component Value Date   CHOLHDL 3 01/27/2012   CHOLHDL 3.6 08/23/2011   CHOLHDL 3.3 04/09/2011   No results found for this basename: LDLDIRECT      Radiology: CXR indicated no acute changes EKG: NSR at 82 bpm; LAD; RSR prime  pattern; no acute changes.  ASSESSMENT AND PLAN:  1  Unstable angina pectoris 2  CAD, treated medically  - S./P. cardiac catheterization, 10/12 3  Normal LVF, by echocardiography 4  DM 5  HTN 6  HLD    PLAN: Recommendation is to proceed with relook coronary angiography and possible PCI, on Monday. Films were reviewed today, by Dr. Antoine Poche, and we are concerned that her LM stenosis may now be significant. Patient is currently hemodynamically stable, and serial cardiac markers are negative, thus far. We will add ASA 81 mg daily, to her current medication regimen, which includes Plavix. We will substitute subcutaneous heparin with IV heparin, to be dosed per pharmacy protocol. The patient was in agreement with this recommendation, and the risks/benefits were discussed, in conjunction with Dr. Rollene Rotunda. .   Signed: Gene [name] 03/14/2012, 11:34 AM Co-Sign MD  History reviewed with the patient, no changes to be made. The pain is suspicious for unstable angina with recurrent symptoms in the last few days.  I reviewed the cath films from October.  I am concerned that the LM lesion could have progressed.  If the pain is from the diag ostial lesion this would likely be managed medicall The patient exam reveals Lungs: clear, COR:  RRR, rub, Abd:  Positive bowel sounds, no rebound no guarding.  All available labs, radiology testing, previous records reviewed. Agree with documented assessment and plan.  Plan cath.  The patient understands that risks included but are not limited to stroke (1 in 1000), death (1 in 1000), kidney failure [usually temporary] (1  in 500), bleeding (1 in 200), allergic reaction [possibly serious] (1 in 200).  The patient understands and agrees to proceed.   Christine [name]  12:08 PM 10/04/2011

## 2012-03-14 NOTE — H&P (Signed)
Internal Medicine Teaching Service Attending Note Date: 03/14/2012  Patient name: Christine [name]  Medical record number: [id number]  Date of birth: 01/24/1934   I have seen and evaluated [name] and discussed their care with the Residency Team. Please see Dr Marge Duncans H&P for full details. Ms Campion was admitted for L chest pain, dyspnea, and DOE. It is hard to get specifics on these sxs but seem to have started and progressed since Nov when she was told about her heart. Sxs much worse the past two days. Unable to go up stairs now and had to sleep downstairs. Can no longer cook a bit and do dishes. She also describes fatigue. She was seen by cards recently who increased her Imdur and decreased her BB but her sxs progressed.  PMHx is sig for CAD with last cath in 2011 that showed stenosis but were not amenable to stenting and med mgmt was rec.  Also sig for COPD but PFT's not c/w obstruction : FEV1/FVC 96% 2011. No tobacco hx but had wood burning stove. Was supposed to be on Advair but none in one year.   Exam : vitals stable HRRR no MRG LCTA ABD benign  EXT no edema  Assessment and Plan: I agree with the formulated Assessment and Plan with the following changes:  1. CP - tele. Cycle CE and EKG's. Cards consult - plan cath Monday. Cont BB, ASA, statin, plavix. 2. Dyspnea - likely angina equivalent. Await cath results. Doesn't have COPD - doubt MDI;s will be of benefit. PT/OT after cath to see exercise tolerance and home needs. 3. Elevated alk Phos - has been elevated in past, No W/U previously. Check GGT.

## 2012-03-15 LAB — CBC
MCH: 29.5 pg (ref 26.0–34.0)
MCHC: 33.1 g/dL (ref 30.0–36.0)
MCV: 89.3 fL (ref 78.0–100.0)
Platelets: 326 10*3/uL (ref 150–400)
RBC: 4.4 MIL/uL (ref 3.87–5.11)
RDW: 14.7 % (ref 11.5–15.5)

## 2012-03-15 LAB — HEPARIN LEVEL (UNFRACTIONATED): Heparin Unfractionated: 0.7 IU/mL (ref 0.30–0.70)

## 2012-03-15 MED ORDER — INSULIN ASPART 100 UNIT/ML ~~LOC~~ SOLN
0.0000 [IU] | Freq: Three times a day (TID) | SUBCUTANEOUS | Status: DC
  Administered 2012-03-15: 3 [IU] via SUBCUTANEOUS

## 2012-03-15 MED ORDER — DOCUSATE SODIUM 100 MG PO CAPS
100.0000 mg | ORAL_CAPSULE | Freq: Every day | ORAL | Status: DC
  Administered 2012-03-15 – 2012-03-17 (×2): 100 mg via ORAL
  Filled 2012-03-15 (×3): qty 1

## 2012-03-15 MED ORDER — POLYETHYLENE GLYCOL 3350 17 G PO PACK
17.0000 g | PACK | Freq: Every day | ORAL | Status: DC | PRN
  Administered 2012-03-15: 17 g via ORAL
  Filled 2012-03-15: qty 1

## 2012-03-15 MED ORDER — LORATADINE 10 MG PO TABS
10.0000 mg | ORAL_TABLET | Freq: Every day | ORAL | Status: DC
  Administered 2012-03-15 – 2012-03-17 (×3): 10 mg via ORAL
  Filled 2012-03-15 (×3): qty 1

## 2012-03-15 MED ORDER — LORATADINE 10 MG PO TABS
10.0000 mg | ORAL_TABLET | Freq: Once | ORAL | Status: DC
  Filled 2012-03-15: qty 1

## 2012-03-15 NOTE — Progress Notes (Signed)
Subjective: She reports feeling much better this morning, denies any chest pain or SOB.  Objective: Vital signs in last 24 hours: Filed Vitals:   03/14/12 0700 03/14/12 1100 03/14/12 1400 03/14/12 2100  BP:  155/77 117/62 128/57  Pulse:   87 58  Temp:   98.2 F (36.8 C) 97.9 F (36.6 C)  TempSrc:   Oral   Resp:   20 14  Height:      Weight:      SpO2: 100%  97% 98%   Weight change:   Intake/Output Summary (Last 24 hours) at 03/15/12 0759 Last data filed at 03/14/12 1400  Gross per 24 hour  Intake    720 ml  Output      0 ml  Net    720 ml   Physical Exam: General: alert, well-developed, and cooperative to examination.  Neck: supple, full ROM, no thyromegaly, no JVD, and no carotid bruits.  Lungs: normal respiratory effort, no accessory muscle use, normal breath sounds, no crackles, and no wheezes. Heart: distant heart sounds,normal rate, regular rhythm, 1/6 systolic murmur, no gallop, and no rub.  Abdomen: soft, non-tender, normal bowel sounds, no distention, no guarding, no rebound tenderness Msk: no joint swelling, no joint warmth, and no redness over joints.  Pulses: 2+ DP/PT pulses bilaterally Extremities: No cyanosis, clubbing, edema Neurologic: alert & oriented X3, cranial nerves II-XII intact moving all 4 extremities Lab Results: Basic Metabolic Panel:  Lab 03/13/12 4540  NA 137  K 4.0  CL 99  CO2 29  GLUCOSE 100*  BUN 15  CREATININE 1.07  CALCIUM 10.2  MG --  PHOS --   Liver Function Tests:  Lab 03/13/12 1809  AST 19  ALT 20  ALKPHOS 161*  BILITOT 0.3  PROT 8.0  ALBUMIN 4.3   CBC:  Lab 03/15/12 0605 03/13/12 1809  WBC 8.7 6.1  NEUTROABS -- 3.2  HGB 13.0 12.5  HCT 39.3 37.6  MCV 89.3 88.7  PLT 326 322   Cardiac Enzymes:  Lab 03/14/12 0935 03/14/12 0230 03/13/12 1809  CKTOTAL 175 205* 217*  CKMB 2.7 3.0 3.4  CKMBINDEX -- -- --  TROPONINI <0.30 <0.30 <0.30   BNP:  Lab 03/13/12 1809  PROBNP 115.3   CBG:  Lab 03/15/12 0657  03/14/12 0737 03/13/12 1758 03/13/12 1516  GLUCAP 131* 105* 86 106*   Hemoglobin A1C:  Lab 03/13/12 1809  HGBA1C 6.8*  Thyroid Function Tests:  Lab 03/13/12 1809  TSH 2.168  T4TOTAL --  FREET4 --  T3FREE --  THYROIDAB --   Anemia Panel:  Lab 03/14/12 0955  VITAMINB12 --  FOLATE >20.0  FERRITIN 90  TIBC 272  IRON 53  RETICCTPCT 1.4    Medications:  Scheduled Meds:   . atorvastatin  40 mg Oral QPM  . clopidogrel  75 mg Oral Daily  . diazepam  5 mg Oral On Call  . Fluticasone-Salmeterol  1 puff Inhalation Q12H  . glimepiride  1 mg Oral QAC breakfast  . heparin  3,000 Units Intravenous Once  . imipramine  100 mg Oral BID  . irbesartan  150 mg Oral Daily  . isosorbide mononitrate  60 mg Oral Daily  . levothyroxine  25 mcg Oral Daily  . loratadine  10 mg Oral Once  . metoprolol succinate  50 mg Oral Daily  . mulitivitamin with minerals  1 tablet Oral Daily  . pantoprazole  40 mg Oral Daily  . sodium chloride  3 mL Intravenous Q12H  .  DISCONTD: albuterol  2.5 mg Nebulization Q6H  . DISCONTD: aspirin  324 mg Oral Pre-Cath  . DISCONTD: aspirin  81 mg Oral Daily  . DISCONTD: diazepam  5 mg Oral On Call  . DISCONTD: heparin subcutaneous  5,000 Units Subcutaneous Q8H  . DISCONTD: ipratropium  0.5 mg Nebulization Q6H  . DISCONTD: tiotropium  18 mcg Inhalation Daily   Continuous Infusions:   . sodium chloride    . heparin 600 Units/hr (03/14/12 2245)   PRN Meds:.sodium chloride, acetaminophen, albuterol, guaiFENesin-dextromethorphan, ipratropium, morphine injection, nitroGLYCERIN, ondansetron (ZOFRAN) IV, ondansetron, sodium chloride Assessment/Plan: 1. Chest pain/SOB: Clinically improving since admission. Differential diagnosis include diastolic CHF and ACS vs. Musculoskeletal. Given her hx of chest pain in the past several weeks, unstable angina is concerning. CEs x 3 have been negative. EKG did not show any significant change from previous EKGs. TSH was wnl. ProBNP  115.3 and CXR did not show any congestion which makes CHF less likely.  -Repeat 2D echo: Ef 60-65%, mild LVH, grage I diastolic dysfunction -Lipid panel was done in 01/2012: LDL 49, will continue statin  -Will continue ASA, Plavix, BB, ARB, Imdur, heparin gtt  -NTG SL and/or Morphine PRN chest pain  -Awaiting for cardiac cath on Monday  2. HYPOTHYROIDISM-stable, Last TSH was 2.72 in 01/30/2012.  -Will continue current dose of Synthroid qAM   3.DIABETES MELLITUS, TYPE II- well controlled, CBGs 105-131, HbA1c was 6.8 in 12/2011.  Will hold Glimeperide and change to SSI sensitive   4. HYPERTENSION- adequately controlled, BP 146/92  -Will continue current home meds  5: Elevated Alk Phos- GGT is 33, wnl which rules out liver source.  Her Alk phos has been fluctuated from 106-161.  Will continue to monitor.  6. Anemia: likely iron deficiency anemia. Hb is stable at 13. Will continue to monitor CBC Iron 53 TIBC 272 Sat Ratio 19 UIBC 219 Folate >20 Ferritin 90 Vit B12: pending, 04/2010 901)  DVT ppx: Heparin gtt   LOS: 2 days   Christine [name] 03/15/2012, 7:59 AM

## 2012-03-15 NOTE — Progress Notes (Signed)
ANTICOAGULATION CONSULT NOTE - Follow Up Consult  Pharmacy Consult for heparin Indication: chest pain/ACS  Labs:  Basename 03/15/12 1446 03/15/12 0605 03/14/12 2200 03/14/12 0935 03/14/12 0230 03/13/12 1809  HGB -- 13.0 -- -- -- 12.5  HCT -- 39.3 -- -- -- 37.6  PLT -- 326 -- -- -- 322  APTT -- -- -- -- -- --  LABPROT -- -- -- -- -- --  INR -- -- -- -- -- --  HEPARINUNFRC 0.42 0.70 0.94* -- -- --  CREATININE -- -- -- -- -- 1.07  CKTOTAL -- -- -- 175 205* 217*  CKMB -- -- -- 2.7 3.0 3.4  TROPONINI -- -- -- <0.30 <0.30 <0.30    Assessment: 78yo female with 2nd therapeutic on heparin level (HL 0.42 and 0.70) after rate decreased to 600 units/hr.  CBC stable. No bleeding reported. For cath on Monday. Goal of Therapy:  Heparin level 0.3-0.7 units/ml   Plan:  Continue heparin at  600 units/hr Daily HL and CBC while on heparin.  Herby Abraham, Pharm.D. [phone] 03/15/2012 4:00 PM

## 2012-03-15 NOTE — Progress Notes (Addendum)
PA (Charlann Wayne) notified about missing pre-cath orders for pt.  She stated that there may be a specific reason that the orders are not in as of now.  She asked me to place the order: NPO after midnight and to notify MD in the morning if there are still no pre-cath orders for the pt.   [facility] [place], RN       Addendum: I verified that pre-cath orders were already entered on this patient at time of call, had been entered on Saturday 4/27 by weekend providers. Damonie Furney PA-C

## 2012-03-15 NOTE — Progress Notes (Signed)
SUBJECTIVE: no recurrent CP, since ADM   Filed Vitals:   03/14/12 0700 03/14/12 1100 03/14/12 1400 03/14/12 2100  BP:  155/77 117/62 128/57  Pulse:   87 58  Temp:   98.2 F (36.8 C) 97.9 F (36.6 C)  TempSrc:   Oral   Resp:   20 14  Height:      Weight:      SpO2: 100%  97% 98%    Intake/Output Summary (Last 24 hours) at 03/15/12 0802 Last data filed at 03/14/12 1400  Gross per 24 hour  Intake    720 ml  Output      0 ml  Net    720 ml    TELEMETRY: Reviewed telemetry pt in NSR/ST  LABS: Basic Metabolic Panel:  [facility] 03/13/12 1809  NA 137  K 4.0  CL 99  CO2 29  GLUCOSE 100*  BUN 15  CREATININE 1.07  CALCIUM 10.2  MG --  PHOS --   Liver Function Tests:  [facility] 03/13/12 1809  AST 19  ALT 20  ALKPHOS 161*  BILITOT 0.3  PROT 8.0  ALBUMIN 4.3   No results found for this basename: LIPASE:2,AMYLASE:2 in the last 72 hours CBC:  Basename 03/15/12 0605 03/13/12 1809  WBC 8.7 6.1  NEUTROABS -- 3.2  HGB 13.0 12.5  HCT 39.3 37.6  MCV 89.3 88.7  PLT 326 322   Cardiac Enzymes:  Basename 03/14/12 0935 03/14/12 0230 03/13/12 1809  CKTOTAL 175 205* 217*  CKMB 2.7 3.0 3.4  CKMBINDEX -- -- --  TROPONINI <0.30 <0.30 <0.30   BNP: No components found with this basename: POCBNP:3 D-Dimer: No results found for this basename: DDIMER:2 in the last 72 hours Hemoglobin A1C:  Basename 03/13/12 1809  HGBA1C 6.8*   Fasting Lipid Panel: No results found for this basename: CHOL,HDL,LDLCALC,TRIG,CHOLHDL,LDLDIRECT in the last 72 hours Thyroid Function Tests:  Basename 03/13/12 1809  TSH 2.168  T4TOTAL --  T3FREE --  THYROIDAB --   Anemia Panel:  Basename 03/14/12 0955  VITAMINB12 --  FOLATE >20.0  FERRITIN 90  TIBC 272  IRON 53  RETICCTPCT 1.4    RADIOLOGY: Dg Chest 2 View  03/13/2012  *RADIOLOGY REPORT*  Clinical Data: Shortness of breath and chest pain  CHEST - 2 VIEW  Comparison: 01/30/2012  Findings: The heart size and mediastinal contours  are within normal limits. Both lungs are clear.  The visualized skeletal structures are unremarkable.  IMPRESSION: Negative exam.  Original Report Authenticated By: Rosealee Albee, M.D.    PHYSICAL EXAM: GENERAL: well-nourished, well-developed; NAD HEENT: NCAT, PERRLA, EOMI; sclera clear; no xanthelasma NECK: palpable bilateral carotid pulses, no bruits; no JVD; no TM LUNGS: CTA bilaterally CARDIAC: RRR (S1, S2); no significant murmurs; no rubs or gallops ABDOMEN: soft, non-tender; intact BS EXTREMETIES: intact distal pulses; no significant peripheral edema SKIN: warm/dry; no obvious rash/lesions MUSCULOSKELETAL: no joint deformity NEURO: no focal deficit; NL affect  ASSESSMENT AND PLAN: 1 UAP  - r/o MI, NL cardiac markers 2  CAD  - non obstructive, by cath, 10/12 3  NL LVF, by echo 4  DIABETES MELLITUS, TYPE II, on Statin 5  HYPERTENSION  PLAN: Proceed with cardiac cath in AM (on board). Continue IV Heparin. On Plavix (ASA: angioedema)   Gene Serpe  Cardiology Attending  Patient seen and examined. Agree with the above exam, assessment and plan as noted by Mr. Serpe. For repeat catheterization. She had modest (50%) left main disease at her last cath and symptoms  concerning.  Lewayne Bunting, M.D.

## 2012-03-15 NOTE — Progress Notes (Signed)
ANTICOAGULATION CONSULT NOTE - Follow Up Consult  Pharmacy Consult for heparin Indication: chest pain/ACS  Labs:  Basename 03/15/12 0605 03/14/12 2200 03/14/12 0935 03/14/12 0230 03/13/12 1809  HGB 13.0 -- -- -- 12.5  HCT 39.3 -- -- -- 37.6  PLT 326 -- -- -- 322  APTT -- -- -- -- --  LABPROT -- -- -- -- --  INR -- -- -- -- --  HEPARINUNFRC 0.70 0.94* -- -- --  CREATININE -- -- -- -- 1.07  CKTOTAL -- -- 175 205* 217*  CKMB -- -- 2.7 3.0 3.4  TROPONINI -- -- <0.30 <0.30 <0.30    Assessment: 78yo female therapeutic on heparin (HL 0.70) after rate decreased to 600 units/hr.  CBC stable. No bleeding reported. For cath on Monday. Goal of Therapy:  Heparin level 0.3-0.7 units/ml   Plan:  Continue heparin at  600 units/hr and  Confirm level  level in 8hr.  Herby Abraham, Pharm.D. [phone] 03/15/2012 7:40 AM

## 2012-03-15 NOTE — Evaluation (Signed)
Physical Therapy Evaluation Patient Details Name: Christine [name] MRN: [id number] DOB: 01/24/1934 Today's Date: 03/15/2012 Time: 0920-0940 PT Time Calculation (min): 20 min  PT Assessment / Plan / Recommendation Clinical Impression  pt presents with CP.  To have Cardiac Cath tomorrow per pt.  pt moves well and notes only has difficulty with increased distance and activity.      PT Assessment  Patient needs continued PT services    Follow Up Recommendations  No PT follow up    Equipment Recommendations  None recommended by PT    Frequency Min 3X/week    Precautions / Restrictions Precautions Precautions: None Restrictions Weight Bearing Restrictions: No   Pertinent Vitals/Pain       Mobility  Bed Mobility Bed Mobility: Not assessed Transfers Transfers: Sit to Stand;Stand to Sit Sit to Stand: With upper extremity assist;From bed;6: Modified independent (Device/Increase time) Stand to Sit: With upper extremity assist;With armrests;To chair/3-in-1;6: Modified independent (Device/Increase time) Details for Transfer Assistance: Demos good technique and safety.   Ambulation/Gait Ambulation/Gait Assistance: 5: Supervision Ambulation Distance (Feet): 200 Feet Assistive device: None Ambulation/Gait Assistance Details: pt moves well, but as she becomes SOB starts to reach for rail and notes needing to slow down.  pt notes this has been going on like this for a long time.   Gait Pattern: Within Functional Limits Stairs: No Wheelchair Mobility Wheelchair Mobility: No    Exercises     PT Goals Acute Rehab PT Goals PT Goal Formulation: With patient Time For Goal Achievement: 03/22/12 Potential to Achieve Goals: Good Pt will Ambulate: >150 feet;Independently PT Goal: Ambulate - Progress: Goal set today Pt will Go Up / Down Stairs: 3-5 stairs;with supervision PT Goal: Up/Down Stairs - Progress: Goal set today  Visit Information  Last PT Received On: 03/15/12 Assistance  Needed: +1    Subjective Data  Subjective: I can move I just get so short winded and get pressure in my chest.   Patient Stated Goal: Walk more.     Prior Functioning  Home Living Lives With: Daughter Available Help at Discharge: Family;Available PRN/intermittently Type of Home: House Home Access: Stairs to enter [employer] of Steps: couple Entrance Stairs-Rails: None Home Layout: Two level;1/2 bath on main level Alternate Level Stairs-Number of Steps: 16 Alternate Level Stairs-Rails: Left Bathroom Shower/Tub: Engineer, manufacturing systems: Standard Bathroom Accessibility: No Home Adaptive Equipment: Bedside commode/3-in-1 Prior Function Level of Independence: Needs assistance Needs Assistance:  (Heavy housekeeping only.  ) Able to Take Stairs?: Yes Driving: No Vocation: Retired Musician: No difficulties;HOH Dominant Hand: Right    Cognition  Overall Cognitive Status: Appears within functional limits for tasks assessed/performed Arousal/Alertness: Awake/alert Orientation Level: Oriented X4 / Intact Behavior During Session: WFL for tasks performed    Extremity/Trunk Assessment Right Lower Extremity Assessment RLE ROM/Strength/Tone: Within functional levels Left Lower Extremity Assessment LLE ROM/Strength/Tone: Within functional levels   Balance Balance Balance Assessed: No  End of Session PT - End of Session Equipment Utilized During Treatment: Gait belt Activity Tolerance: Patient tolerated treatment well;Patient limited by fatigue Patient left: in chair;with call bell/phone within reach;with family/visitor present Nurse Communication: Mobility status   Sunny Schlein, [place] [phone] 03/15/2012, 12:57 PM

## 2012-03-15 NOTE — Progress Notes (Signed)
I agree with the assessments and medication admin done by Megan Roberts [employer] student from 7p-7a. 

## 2012-03-16 ENCOUNTER — Encounter (HOSPITAL_COMMUNITY): Payer: Self-pay | Admitting: Cardiology

## 2012-03-16 ENCOUNTER — Encounter (HOSPITAL_COMMUNITY)
Admission: AD | Disposition: A | Discharge status: Home / Self Care | Payer: Self-pay | Source: Ambulatory Visit | Attending: Internal Medicine

## 2012-03-16 DIAGNOSIS — I2510 Atherosclerotic heart disease of native coronary artery without angina pectoris: Secondary | ICD-10-CM

## 2012-03-16 HISTORY — PX: LEFT HEART CATHETERIZATION WITH CORONARY ANGIOGRAM: SHX5451

## 2012-03-16 LAB — GLUCOSE, CAPILLARY
Glucose-Capillary: 81 mg/dL (ref 70–99)
Glucose-Capillary: 84 mg/dL (ref 70–99)
Glucose-Capillary: 98 mg/dL (ref 70–99)

## 2012-03-16 LAB — CBC
HCT: 38.4 % (ref 36.0–46.0)
MCHC: 32.8 g/dL (ref 30.0–36.0)
MCV: 90.1 fL (ref 78.0–100.0)
RDW: 14.9 % (ref 11.5–15.5)

## 2012-03-16 LAB — BASIC METABOLIC PANEL
BUN: 21 mg/dL (ref 6–23)
Calcium: 9.9 mg/dL (ref 8.4–10.5)
Creatinine, Ser: 1.02 mg/dL (ref 0.50–1.10)
GFR calc Af Amer: 60 mL/min — ABNORMAL LOW (ref >90)
GFR calc non Af Amer: 52 mL/min — ABNORMAL LOW (ref >90)

## 2012-03-16 LAB — HEPARIN LEVEL (UNFRACTIONATED): Heparin Unfractionated: 0.45 IU/mL (ref 0.30–0.70)

## 2012-03-16 SURGERY — LEFT HEART CATHETERIZATION WITH CORONARY ANGIOGRAM
Anesthesia: LOCAL

## 2012-03-16 MED ORDER — LIDOCAINE HCL (PF) 1 % IJ SOLN
INTRAMUSCULAR | Status: AC
  Filled 2012-03-16: qty 30

## 2012-03-16 MED ORDER — ACETAMINOPHEN 325 MG PO TABS: 650.0000 mg | ORAL_TABLET | ORAL | Status: DC | PRN

## 2012-03-16 MED ORDER — ONDANSETRON HCL 4 MG/2ML IJ SOLN: 4.0000 mg | Freq: Four times a day (QID) | INTRAMUSCULAR | Status: DC | PRN

## 2012-03-16 MED ORDER — SODIUM CHLORIDE 0.9 % IV SOLN
INTRAVENOUS | Status: AC
  Administered 2012-03-16: 19:00:00 via INTRAVENOUS

## 2012-03-16 MED ORDER — HYDRALAZINE HCL 20 MG/ML IJ SOLN
INTRAMUSCULAR | Status: AC
  Filled 2012-03-16: qty 1

## 2012-03-16 MED ORDER — MIDAZOLAM HCL 2 MG/2ML IJ SOLN
INTRAMUSCULAR | Status: AC
  Filled 2012-03-16: qty 2

## 2012-03-16 MED ORDER — NITROGLYCERIN 0.2 MG/ML ON CALL CATH LAB
INTRAVENOUS | Status: AC
  Filled 2012-03-16: qty 1

## 2012-03-16 MED ORDER — HYDRALAZINE HCL 20 MG/ML IJ SOLN
5.0000 mg | Freq: Once | INTRAMUSCULAR | Status: AC
  Administered 2012-03-16: 5 mg via INTRAVENOUS

## 2012-03-16 MED ORDER — HEPARIN (PORCINE) IN NACL 2-0.9 UNIT/ML-% IJ SOLN
INTRAMUSCULAR | Status: AC
  Filled 2012-03-16: qty 2000

## 2012-03-16 NOTE — H&P (View-Only) (Signed)
PA (Lavell Supple) notified about missing pre-cath orders for pt.  She stated that there may be a specific reason that the orders are not in as of now.  She asked me to place the order: NPO after midnight and to notify MD in the morning if there are still no pre-cath orders for the pt.   Elena Carillo Mosqueda, RN       Addendum: I verified that pre-cath orders were already entered on this patient at time of call, had been entered on Saturday 4/27 by weekend providers. Zula Hovsepian PA-C 

## 2012-03-16 NOTE — Progress Notes (Signed)
PT CANCELLATION NOTE:03/16/2012   PT session cancelled secondary to pt in cath lab.  Will attempt to see pt again tomorrow.  Sora Vrooman L. Mariselda Badalamenti DPT [phone]

## 2012-03-16 NOTE — Progress Notes (Signed)
Patient Name: Christine [name] Date of Encounter: 03/16/2012     Active Problems:  HYPOTHYROIDISM  DIABETES MELLITUS, TYPE II  HYPERTENSION  Coronary artery disease  Chest pain    SUBJECTIVE: Reports improved chest pain daily. No complaints this AM. Specifically denies shortness of breath, lightheadedness, diaphoresis, abdominal pain, n/v or palpitations.    OBJECTIVE  Filed Vitals:   03/15/12 0955 03/15/12 1406 03/15/12 2100 03/16/12 0600  BP: 107/58 103/50 151/56 134/62  Pulse: 93 81 65 72  Temp:  98.5 F (36.9 C) 98.1 F (36.7 C) 97.5 F (36.4 C)  TempSrc:  Oral    Resp:  16 14 18   Height:      Weight:      SpO2:  98% 97% 99%    Intake/Output Summary (Last 24 hours) at 03/16/12 1478 Last data filed at 03/15/12 1900  Gross per 24 hour  Intake    192 ml  Output      0 ml  Net    192 ml   Weight change:   PHYSICAL EXAM  General: Elderly, well developed, well nourished, in no acute distress. Head: Normocephalic, atraumatic, sclera non-icteric, no xanthomas, nares are without discharge.  Neck: Supple without bruits or JVD. Lungs:  Resp regular and unlabored, CTAB without wheezes, rales or rhonchi Heart: RRR no s3, s4, or murmurs. Abdomen: Soft, non-tender, non-distended, BS + x 4.  Msk:  Strength and tone appears normal for age. Extremities: No clubbing, cyanosis or edema. DP/PT/Radials 2+ and equal bilaterally. Neuro: Alert and oriented X 3. Moves all extremities spontaneously. Psych: Normal affect.  LABS:  Recent Labs  [facility] 03/16/12 0715 03/15/12 0605   WBC 6.5 8.7   HGB 12.6 13.0   HCT 38.4 39.3   MCV 90.1 89.3   PLT 317 326    Basename 03/15/12 0837 03/14/12 0955  VITAMINB12 937* --  FOLATE -- >20.0  FERRITIN -- 90  TIBC -- 272  IRON -- 53  RETICCTPCT -- 1.4   Lab 03/16/12 0715 03/13/12 1809  NA 138 137  K 4.2 4.0  CL 102 99  CO2 25 29  BUN 21 15  CREATININE 1.02 1.07  CALCIUM 9.9 10.2  PROT -- 8.0  BILITOT -- 0.3  ALKPHOS --  161*  ALT -- 20  AST -- 19  AMYLASE -- --  LIPASE -- --  GLUCOSE 99 100*   Recent Labs  Basename 03/13/12 1809   HGBA1C 6.8*   Recent Labs  Basename 03/14/12 0935 03/14/12 0230 03/13/12 1809   CKTOTAL 175 205* 217*   CKMB 2.7 3.0 3.4   CKMBINDEX -- -- --   TROPONINI <0.30 <0.30 <0.30   Basename 03/13/12 1809  TSH 2.168  T4TOTAL --  T3FREE --  THYROIDAB --    TELE: NSR, 70-80 bpm  Radiology/Studies:  Dg Chest 2 View  03/13/2012  *RADIOLOGY REPORT*  Clinical Data: Shortness of breath and chest pain  CHEST - 2 VIEW  Comparison: 01/30/2012  Findings: The heart size and mediastinal contours are within normal limits. Both lungs are clear.  The visualized skeletal structures are unremarkable.  IMPRESSION: Negative exam.  Original Report Authenticated By: Rosealee Albee, M.D.    Current Medications:     . atorvastatin  40 mg Oral QPM  . clopidogrel  75 mg Oral Daily  . diazepam  5 mg Oral On Call  . docusate sodium  100 mg Oral Daily  . Fluticasone-Salmeterol  1 puff Inhalation Q12H  . imipramine  100 mg Oral BID  . insulin aspart  0-9 Units Subcutaneous TID WC  . irbesartan  150 mg Oral Daily  . isosorbide mononitrate  60 mg Oral Daily  . levothyroxine  25 mcg Oral Daily  . loratadine  10 mg Oral Daily  . metoprolol succinate  50 mg Oral Daily  . mulitivitamin with minerals  1 tablet Oral Daily  . pantoprazole  40 mg Oral Daily  . sodium chloride  3 mL Intravenous Q12H  . DISCONTD: loratadine  10 mg Oral Once    ASSESSMENT AND PLAN:  78yo AA female with PMHx significant for CAD (nonobstructive by cardiac cath 10/12), chronic diastolic CHF, type 2 DM, HTN, HL, hypothyroidism and anemia who was admitted on 03/13/12 c/o worsening chest pain concerning for unstable angina.   1. CAD/unstable angina- chest pain improved this AM and since admission. Denies shortness of breath, lightheadedness, diaphoresis and palpitations. Cardiac biomarkers cycled and negative x 3.  Serial EKGs without evidence of ischemia. Currently NPO and heparinized.   - For cath today  - No ASA (allergic--> angioedema); none ordered on pre-cath order set   - Continue Plavix, BB, ARB, Imdur, statin, NTG SL PRN  - NPO currently  - Continue heparin until discontinued per cath protocol  - Further plans per invasive MD at the time of cath  2. Chronic diastolic CHF- patient denies SOB, PND, orthopnea or LE swelling. Euvolemic on exam. Admission pBNP WNL. CXR unremarkable. Echo revealed LVEF 60-65%, mild LVH with concentric hypertrophy and grade 1 diastolic dysfunction. Well-compensated.  - Will continue to monitor  3. Hypertension- well-controlled  - Continue aforementioned antihypertensives  4. Hyperlipidemia  - Continue statin as above   Signed, R. Hurman Horn, PA-C 03/16/2012, 9:24 AM

## 2012-03-16 NOTE — Progress Notes (Signed)
Cleon Dew, PharmD 9:48 AM 03/16/2012

## 2012-03-16 NOTE — Progress Notes (Signed)
OT Cancellation Note  Evaluation cancelled today due to pt. going for cardiac cath today and declines OOB activity until after procedure. Will re-attempt as time allows.Marland Kitchen  Tran Randle, OTR/L Pager [phone] 03/16/2012, 11:11 AM

## 2012-03-16 NOTE — Interval H&P Note (Signed)
History and Physical Interval Note:  03/16/2012 3:39 PM  Christine [name]  has presented today for surgery, with the diagnosis of cp  The various methods of treatment have been discussed with the patient. After consideration of risks, benefits and other options for treatment, the patient has consented to  Procedure(s) (LRB): LEFT HEART CATHETERIZATION WITH CORONARY ANGIOGRAM (N/A) as a surgical intervention .  The patients' history has been reviewed, patient examined, no change in status, stable for surgery.  I have reviewed the patients' chart and labs.  Questions were answered to the patient's satisfaction.     [name]

## 2012-03-16 NOTE — Progress Notes (Signed)
Subjective: No acute events overnight, denies chest pain of sob  Objective: Vital signs in last 24 hours: Filed Vitals:   03/15/12 2100 03/16/12 0600 03/16/12 0935 03/16/12 1338  BP: 151/56 134/62 103/64 132/66  Pulse: 65 72 74 78  Temp: 98.1 F (36.7 C) 97.5 F (36.4 C) 97.6 F (36.4 C) 97.5 F (36.4 C)  TempSrc:    Oral  Resp: 14 18 19 16   Height:    5\' 5"  (1.651 m)  Weight:    130 lb 6.4 oz (59.149 kg)  SpO2: 97% 99% 96% 99%   Weight change:   Intake/Output Summary (Last 24 hours) at 03/16/12 1448 Last data filed at 03/15/12 1900  Gross per 24 hour  Intake    192 ml  Output      0 ml  Net    192 ml   Physical Exam: General: alert, well-developed, and cooperative to examination.  Neck: supple, full ROM, no thyromegaly, no JVD, and no carotid bruits.  Lungs: normal respiratory effort, no accessory muscle use, normal breath sounds, no crackles, and no wheezes. Heart: normal rate, regular rhythm, no gallop, no murmur nor rub appreciated this morning.  Abdomen: soft, non-tender, normal bowel sounds, no distention, no guarding Pulses: 2+ DP/PT pulses bilaterally Extremities: warm, no LE edema Neurologic: alert & oriented X3, cranial nerves II-XII intact moving all 4 extremities  Lab Results: Basic Metabolic Panel:  Lab 03/16/12 9562 03/13/12 1809  NA 138 137  K 4.2 4.0  CL 102 99  CO2 25 29  GLUCOSE 99 100*  BUN 21 15  CREATININE 1.02 1.07  CALCIUM 9.9 10.2  MG -- --  PHOS -- --   Liver Function Tests:  Lab 03/13/12 1809  AST 19  ALT 20  ALKPHOS 161*  BILITOT 0.3  PROT 8.0  ALBUMIN 4.3   CBC:  Lab 03/16/12 0715 03/15/12 0605 03/13/12 1809  WBC 6.5 8.7 --  NEUTROABS -- -- 3.2  HGB 12.6 13.0 --  HCT 38.4 39.3 --  MCV 90.1 89.3 --  PLT 317 326 --   Cardiac Enzymes:  Lab 03/14/12 0935 03/14/12 0230 03/13/12 1809  CKTOTAL 175 205* 217*  CKMB 2.7 3.0 3.4  CKMBINDEX -- -- --  TROPONINI <0.30 <0.30 <0.30   BNP:  Lab 03/13/12 1809  PROBNP 115.3    CBG:  Lab 03/16/12 1143 03/16/12 0754 03/15/12 2224 03/15/12 1632 03/15/12 1129 03/15/12 0657  GLUCAP 103* 112* 117* 83 211* 131*   Hemoglobin A1C:  Lab 03/13/12 1809  HGBA1C 6.8*  Thyroid Function Tests:  Lab 03/13/12 1809  TSH 2.168  T4TOTAL --  FREET4 --  T3FREE --  THYROIDAB --   Anemia Panel:  Lab 03/15/12 0837 03/14/12 0955  VITAMINB12 937* --  FOLATE -- >20.0  FERRITIN -- 90  TIBC -- 272  IRON -- 53  RETICCTPCT -- 1.4    Medications:  Scheduled Meds:    . atorvastatin  40 mg Oral QPM  . clopidogrel  75 mg Oral Daily  . diazepam  5 mg Oral On Call  . docusate sodium  100 mg Oral Daily  . Fluticasone-Salmeterol  1 puff Inhalation Q12H  . imipramine  100 mg Oral BID  . insulin aspart  0-9 Units Subcutaneous TID WC  . irbesartan  150 mg Oral Daily  . isosorbide mononitrate  60 mg Oral Daily  . levothyroxine  25 mcg Oral Daily  . loratadine  10 mg Oral Daily  . metoprolol succinate  50 mg  Oral Daily  . mulitivitamin with minerals  1 tablet Oral Daily  . pantoprazole  40 mg Oral Daily  . sodium chloride  3 mL Intravenous Q12H  . DISCONTD: loratadine  10 mg Oral Once   Continuous Infusions:    . sodium chloride 75 mL/hr at 03/16/12 1610  . heparin 600 Units/hr (03/15/12 2047)   PRN Meds:.sodium chloride, acetaminophen, albuterol, guaiFENesin-dextromethorphan, ipratropium, morphine injection, nitroGLYCERIN, ondansetron (ZOFRAN) IV, ondansetron, polyethylene glycol, sodium chloride  Assessment/Plan: HD#3 for 78 yo female with h/o nonobstructive CAD s/p cath 08/2011, Diabetes Mellitus, and chronic diastolic CHF, admitted with likely unstable angina.  1. Probable unstable angina- no complaints of chest pain and sob today, Repeat 2D echo: Ef 60-65%, mild LVH, grade I diastolic dysfunction, cardiac cath scheduled for this afternoon -Will continue ASA, Plavix, BB, ARB, Imdur, and statin -appreciate Cardiology recs  2. HYPOTHYROIDISM- stable, Last TSH was  2.72 in 01/30/2012.  -Will continue current dose of Synthroid qAM   3.DIABETES MELLITUS, TYPE II- well controlled, CBGs 103-117, HbA1c was 6.8 in 12/2011.  -cont SSI sensitive   4. HYPERTENSION- adequately controlled -Will continue current home meds  5. Anemia: likely iron deficiency anemia. Hb is stable at ~13.  -Will continue to monitor CBC Iron 53 TIBC 272 Sat Ratio 19 UIBC 219 Folate >20 Ferritin 90 Vit B12: 937  6. Disposition: currently stable, awaiting cardiac catheterization results for further discharge planning -f/u scheduled with Dr. Baltazar Apo 03/23/2012 ([facility]) -f/u scheduled with Dr. Shirlee Latch 04/15/2012 [facility])  DVT ppx: Heparin gtt   LOS: 3 days   Christine [name] 03/16/2012, 2:48 PM

## 2012-03-16 NOTE — Progress Notes (Signed)
ANTICOAGULATION CONSULT NOTE - Follow Up Consult  Pharmacy Consult for heparin Indication: chest pain/ACS  Labs:  Basename 03/16/12 0715 03/15/12 1446 03/15/12 0605 03/14/12 0935 03/14/12 0230 03/13/12 1809  HGB 12.6 -- 13.0 -- -- --  HCT 38.4 -- 39.3 -- -- 37.6  PLT 317 -- 326 -- -- 322  APTT -- -- -- -- -- --  LABPROT 12.6 -- -- -- -- --  INR 0.92 -- -- -- -- --  HEPARINUNFRC 0.45 0.42 0.70 -- -- --  CREATININE 1.02 -- -- -- -- 1.07  CKTOTAL -- -- -- 175 205* 217*  CKMB -- -- -- 2.7 3.0 3.4  TROPONINI -- -- -- <0.30 <0.30 <0.30    Assessment: 78yo female with 3rd therapeutic heparin level (HL 0.42> 0.70> 0.45) CBC stable. No bleeding reported. Patient to have cath procedure today.  Goal of Therapy:  Heparin level 0.3-0.7 units/ml   Plan:  - Continue heparin at  600 units/hr - Follow up post cath orders - Daily HL and CBC while on heparin.  Irineo Axon. [place] D Candidate 03/16/2012 9:10 AM

## 2012-03-16 NOTE — CV Procedure (Signed)
   Cardiac Catheterization Procedure Note  Name: Christine [name] MRN: [id number] DOB: 01/24/1934  Procedure: Left Heart Cath, Selective Coronary Angiography, LV angiography  Indication: chest pain   Procedural details: The right groin was prepped, draped, and anesthetized with 1% lidocaine. Using modified Seldinger technique, a 5 French sheath was introduced into the right femoral artery. Standard Judkins catheters were used for coronary angiography and left ventriculography. Catheter exchanges were performed over a guidewire. There were no immediate procedural complications. The patient was transferred to the post catheterization recovery area for further monitoring.  Procedural Findings: Hemodynamics:  AO 156/72 (1105) LV 165/11   Coronary angiography: Coronary dominance: right  Left mainstem: There is a distal 50% stenosis, similar to before.  It leads into a trifurcation of an LAD, proximal  circumflex with ramus branch.    Left anterior descending (LAD): proximal LAD has about 50% narrowing involving the ostium.  The distal LAD was widely patent. The first diagonal has an 70-80% of ostial narrowing.  The distal LAD wrapped the apical tip.    Left circumflex (LCx): The ostial CFX is mildly narrow, and then trifurcates.  The first ramus branch has 70% ostial narrowing.  There is 50% narrowing leading into the large second ramus which is a large caliber vessel.  The AV circumflex provides a small OM3 and the tiny av circ has about 70% narrowing.    Right coronary artery (RCA): Large caliber vessel with about 30% narrowing in the first turn.  It provides a large PDA which is free of disease, and a smaller PLA without narrowing.    Left ventriculography: Left ventricular systolic function is normal, and hyperdynamic with a post VPB beat.  No WMA.  No obvious MR.    Final Conclusions:   1.  Preserved LV function 2.  50% distal LMCA extending to the LAD 3.  Moderately high grade first  diagonal. 4.  Involvement of two ramus subbranches as noted.      Recommendations:   1.  Review films with Dr. Shirlee Latch.  Anatomy not amenable to PCI, but role of surgery is unclear in this case.    [name] 03/16/2012, 5:27 PM

## 2012-03-16 NOTE — Progress Notes (Signed)
Internal Medicine Attending  Date: 03/16/2012  Patient name: [name] Medical record number: [id number] Date of birth: 01/24/1934 Age: 78 y.o. Gender: female  I saw and evaluated the patient. I reviewed the patient's progress with Dr. Bosie Clos on rounds this AM.  Christine [name] was without any complaints today specifically denying any chest pain or shortness of breath. Her lungs were clear and the heart was regularly regular without murmurs. Her abdomen was soft, nontender, with active bowel sounds. She was on her way to cardiac catheterization. If negative we will likely discharge her home with followup in her primary care clinic. If the cath is positive the decision for intervention at that moment will be up to the interventional cardiologist.  Therefore, further evaluation and intervention are pending the results of the cardiac cath.

## 2012-03-17 DIAGNOSIS — R072 Precordial pain: Secondary | ICD-10-CM

## 2012-03-17 LAB — CBC
Platelets: 262 10*3/uL (ref 150–400)
RBC: 3.78 MIL/uL — ABNORMAL LOW (ref 3.87–5.11)
WBC: 5.9 10*3/uL (ref 4.0–10.5)

## 2012-03-17 LAB — BASIC METABOLIC PANEL
CO2: 24 mEq/L (ref 19–32)
Calcium: 9.5 mg/dL (ref 8.4–10.5)
Chloride: 103 mEq/L (ref 96–112)
GFR calc Af Amer: 61 mL/min — ABNORMAL LOW (ref >90)
Sodium: 138 mEq/L (ref 135–145)

## 2012-03-17 LAB — GLUCOSE, CAPILLARY

## 2012-03-17 MED ORDER — ISOSORBIDE MONONITRATE ER 60 MG PO TB24
90.0000 mg | ORAL_TABLET | Freq: Every day | ORAL | Status: DC
  Filled 2012-03-17: qty 1

## 2012-03-17 MED ORDER — METOPROLOL SUCCINATE ER 100 MG PO TB24
100.0000 mg | ORAL_TABLET | Freq: Every day | ORAL | Status: DC
  Administered 2012-03-17: 100 mg via ORAL
  Filled 2012-03-17: qty 1

## 2012-03-17 MED ORDER — METOPROLOL SUCCINATE ER 100 MG PO TB24: 100.0000 mg | ORAL_TABLET | Freq: Every day | ORAL | Status: DC

## 2012-03-17 MED ORDER — SODIUM CHLORIDE 0.9 % IV SOLN
10.0000 ug/kg | INTRAVENOUS | Status: DC
  Administered 2012-03-17: 10:00:00 via INTRAVENOUS
  Filled 2012-03-17: qty 12

## 2012-03-17 MED ORDER — ISOSORBIDE MONONITRATE ER 30 MG PO TB24: 90.0000 mg | ORAL_TABLET | Freq: Every day | ORAL | Status: DC

## 2012-03-17 MED ORDER — ATROPINE SULFATE 0.1 MG/ML IJ SOLN
INTRAMUSCULAR | Status: AC
  Filled 2012-03-17: qty 10

## 2012-03-17 NOTE — Progress Notes (Signed)
PT/OT Cancellation Note  Treatment cancelled today due to pt in Nuclear Med.  Will try back another time.    Sunny Schlein, [place] [phone] 03/17/2012, 10:17 AM

## 2012-03-17 NOTE — Progress Notes (Signed)
[name], female   DOB: 01/24/1934, 78 y.o.   MRN: [id number]    SUBJECTIVE: Cath results noted yesterday.  Similar to study last fall.  Walked in halls without chest pain.      Marland Kitchen atorvastatin  40 mg Oral QPM  . clopidogrel  75 mg Oral Daily  . diazepam  5 mg Oral On Call  . docusate sodium  100 mg Oral Daily  . Fluticasone-Salmeterol  1 puff Inhalation Q12H  . heparin      . hydrALAZINE  5 mg Intravenous Once  . imipramine  100 mg Oral BID  . insulin aspart  0-9 Units Subcutaneous TID WC  . irbesartan  150 mg Oral Daily  . isosorbide mononitrate  90 mg Oral Daily  . levothyroxine  25 mcg Oral Daily  . lidocaine      . loratadine  10 mg Oral Daily  . metoprolol succinate  100 mg Oral Daily  . midazolam      . nitroGLYCERIN      . pantoprazole  40 mg Oral Daily  . DISCONTD: isosorbide mononitrate  60 mg Oral Daily  . DISCONTD: metoprolol succinate  50 mg Oral Daily  . DISCONTD: mulitivitamin with minerals  1 tablet Oral Daily  . DISCONTD: sodium chloride  3 mL Intravenous Q12H      Filed Vitals:   03/16/12 1753 03/16/12 1814 03/16/12 2100 03/17/12 0500  BP: 162/70 159/55 165/79 150/57  Pulse: 71 68 75 75  Temp:   98 F (36.7 C) 97.9 F (36.6 C)  TempSrc:   Oral Oral  Resp:   20 20  Height:      Weight:      SpO2:   100% 99%    Intake/Output Summary (Last 24 hours) at 03/17/12 0756 Last data filed at 03/17/12 0500  Gross per 24 hour  Intake    973 ml  Output   1000 ml  Net    -27 ml    LABS: Basic Metabolic Panel:  Basename 03/16/12 0715  NA 138  K 4.2  CL 102  CO2 25  GLUCOSE 99  BUN 21  CREATININE 1.02  CALCIUM 9.9  MG --  PHOS --   Liver Function Tests: No results found for this basename: AST:2,ALT:2,ALKPHOS:2,BILITOT:2,PROT:2,ALBUMIN:2 in the last 72 hours No results found for this basename: LIPASE:2,AMYLASE:2 in the last 72 hours CBC:  Basename 03/17/12 0720 03/16/12 0715  WBC 5.9 6.5  NEUTROABS -- --  HGB 11.1* 12.6    HCT 33.8* 38.4  MCV 89.4 90.1  PLT 262 317   Cardiac Enzymes:  Basename 03/14/12 0935  CKTOTAL 175  CKMB 2.7  CKMBINDEX --  TROPONINI <0.30   BNP: No components found with this basename: POCBNP:3 D-Dimer: No results found for this basename: DDIMER:2 in the last 72 hours Hemoglobin A1C: No results found for this basename: HGBA1C in the last 72 hours Fasting Lipid Panel: No results found for this basename: CHOL,HDL,LDLCALC,TRIG,CHOLHDL,LDLDIRECT in the last 72 hours Thyroid Function Tests: No results found for this basename: TSH,T4TOTAL,FREET3,T3FREE,THYROIDAB in the last 72 hours Anemia Panel:  Basename 03/15/12 0837 03/14/12 0955  VITAMINB12 937* --  FOLATE -- >20.0  FERRITIN -- 90  TIBC -- 272  IRON -- 53  RETICCTPCT -- 1.4    RADIOLOGY: Dg Chest 2 View  03/13/2012  *RADIOLOGY REPORT*  Clinical Data: Shortness of breath and chest pain  CHEST - 2 VIEW  Comparison: 01/30/2012  Findings: The heart size and mediastinal  contours are within normal limits. Both lungs are clear.  The visualized skeletal structures are unremarkable.  IMPRESSION: Negative exam.  Original Report Authenticated By: Rosealee Albee, M.D.    PHYSICAL EXAM General: NAD Neck: No JVD, no thyromegaly or thyroid nodule.  Lungs: Clear to auscultation bilaterally with normal respiratory effort. CV: Nondisplaced PMI.  Heart regular S1/S2, no S3/S4, no murmur.  No peripheral edema.  No carotid bruit.  Normal pedal pulses.  Abdomen: Soft, nontender, no hepatosplenomegaly, no distention.  Neurologic: Alert and oriented x 3.  Psych: Normal affect. Extremities: No clubbing or cyanosis.  Right groin cath site benign  ASSESSMENT AND PLAN:  78 yo with history of CAD, HTN, DM presented with symptoms consistent with unstable angina.  Cath showed 50% distal LM extending into ostial LAD with moderate disease in ostial ramus branches and D1.  There did not seem to be significant change from the prior cath last  fall.  Disease appeared moderate.  No good option for PCI.  If disease were thought to be flow-limiting, CABG would be only good option.   - Dobutamine stress echo today to assess for functional significance of moderate coronary disease.  - If DSE is normal, plan discharge home with medical management.  - Increase Imdur to 90 mg daily - Increase Toprol XL to 100 mg daily (hold until after DSE this morning).   Marca Ancona 03/17/2012 7:59 AM

## 2012-03-17 NOTE — Progress Notes (Signed)
  Echocardiogram Echocardiogram Pharmacologic Stress Test has been performed.  [name], Real Cons 03/17/2012, 11:04 AM

## 2012-03-17 NOTE — Progress Notes (Signed)
Agree with above.  Cassandria Anger, OTR/L Pager: [phone] 03/17/2012 .

## 2012-03-17 NOTE — Progress Notes (Signed)
Pt ambulated in hall, catheter site stable after ambulation. Dressing removed and bandaid applied. Vital signs stables. Will continue to monitor.

## 2012-03-17 NOTE — Discharge Summary (Signed)
Internal Medicine Teaching [facility] Discharge Note  Name: Christine [name] MRN: [id number] DOB: 01/24/1934 78 y.o.  Date of Admission: 03/13/2012  4:59 PM Date of Discharge: 03/17/2012 Attending Physician: Rocco Serene, MD  Discharge Diagnosis: Active Problems:  HYPOTHYROIDISM  DIABETES MELLITUS, TYPE II  HYPERTENSION  Coronary artery disease  Chest pain   Discharge Medications: Medication List  As of 03/17/2012 12:29 PM   TAKE these medications         acetaminophen 500 MG tablet   Commonly known as: TYLENOL   Take 1,000 mg by mouth every 6 (six) hours as needed. For pain      ADVAIR DISKUS 250-50 MCG/DOSE Aepb   Generic drug: Fluticasone-Salmeterol   Inhale 1 puff into the lungs every 12 (twelve) hours.      atorvastatin 40 MG tablet   Commonly known as: LIPITOR   Take 40 mg by mouth every evening.      clopidogrel 75 MG tablet   Commonly known as: PLAVIX   Take 75 mg by mouth daily.      diphenhydramine-acetaminophen 25-500 MG Tabs   Commonly known as: TYLENOL PM   Take 1 tablet by mouth at bedtime as needed. For pain/sleep      esomeprazole 40 MG capsule   Commonly known as: NEXIUM   Take 40 mg by mouth daily before breakfast.      glimepiride 1 MG tablet   Commonly known as: AMARYL   Take 1 mg by mouth daily before breakfast.      imipramine 50 MG tablet   Commonly known as: TOFRANIL   Take 100 mg by mouth 2 (two) times daily.      isosorbide mononitrate 30 MG 24 hr tablet   Commonly known as: IMDUR   Take 3 tablets (90 mg total) by mouth daily.      levothyroxine 25 MCG tablet   Commonly known as: SYNTHROID, LEVOTHROID   Take 25 mcg by mouth daily.      loratadine 10 MG tablet   Commonly known as: CLARITIN   Take 10 mg by mouth daily as needed. For allergies      metoprolol succinate 100 MG 24 hr tablet   Commonly known as: TOPROL-XL   Take 1 tablet (100 mg total) by mouth daily. Take with or immediately following a meal.     mulitivitamin with minerals Tabs   Take 1 tablet by mouth daily.      nitroGLYCERIN 0.4 MG SL tablet   Commonly known as: NITROSTAT   Place 0.4 mg under the tongue every 5 (five) minutes as needed. For chest pain      valsartan 160 MG tablet   Commonly known as: DIOVAN   Take 160 mg by mouth daily.      vitamin C 500 MG tablet   Commonly known as: ASCORBIC ACID   Take 500 mg by mouth daily.            Disposition and follow-up:   Christine [name] was discharged from [facility] in Stable and Improved condition.  She should follow-up as scheduled below.  Follow-up Appointments: Follow-up Information    Follow up with [facility] MD on 03/23/2012. (at 11:15)    Contact information:   Internal Medicine Clinic Ground Floor [address] [place] [address] [phone]       Follow up with Marca Ancona, MD on 04/15/2012. (at 9:00)    Contact information:   [facility]  [address] [place] [address] [phone]         Discharge Orders    Future Appointments: Provider: Department: Dept Phone: Center:   03/23/2012 11:15 AM Melida Quitter, MD [facility] [phone] [facility]   04/15/2012 9:00 AM Laurey Morale, MD [facility] [phone] [facility]   04/21/2012 1:45 PM Zollie Beckers, MD [facility] [phone] [facility]      Consultations: Treatment Team:  [facility], MD  Procedures Performed:  Dg Chest 2 View  03/13/2012  *RADIOLOGY REPORT*  Clinical Data: Shortness of breath and chest pain  CHEST - 2 VIEW  Comparison: 01/30/2012  Findings: The heart size and mediastinal contours are within normal limits. Both lungs are clear.  The visualized skeletal structures are unremarkable.  IMPRESSION: Negative exam.  Original Report Authenticated By: Rosealee Albee, M.D.    03/16/2012 2D Echo: Study Conclusions  - Left ventricle: The cavity size was normal. Wall  thickness was increased in a pattern of mild LVH. There was mild concentric hypertrophy. Systolic function was normal. The estimated ejection fraction was in the range of 60% to 65%. Wall motion was normal; there were no regional wall motion abnormalities. Doppler parameters are consistent with abnormal left ventricular relaxation (grade 1 diastolic dysfunction). - Atrial septum: No defect or patent foramen ovale was identified. Transthoracic echocardiography. M-mode, complete 2D, spectral Doppler, and color Doppler. Height: Height: 165.1cm. Height: 65in. Weight: Weight: 61.2kg. Weight: 134.7lb. Body mass index: BMI: 22.5kg/m^2. Body surface area: BSA: 1.90m^2. Blood pressure: 146/92. Patient status: Inpatient. Location: Bedside.    03/16/2012 Cardiac Cath:Procedural Findings:  Hemodynamics:  AO 156/72 (1105)  LV 165/11  Coronary angiography:  Coronary dominance: right  Left mainstem: There is a distal 50% stenosis, similar to before. It leads into a trifurcation of an LAD, proximal circumflex with ramus branch.  Left anterior descending (LAD): proximal LAD has about 50% narrowing involving the ostium. The distal LAD was widely patent. The first diagonal has an 70-80% of ostial narrowing. The distal LAD wrapped the apical tip.  Left circumflex (LCx): The ostial CFX is mildly narrow, and then trifurcates. The first ramus branch has 70% ostial narrowing. There is 50% narrowing leading into the large second ramus which is a large caliber vessel. The AV circumflex provides a small OM3 and the tiny av circ has about 70% narrowing.  Right coronary artery (RCA): Large caliber vessel with about 30% narrowing in the first turn. It provides a large PDA which is free of disease, and a smaller PLA without narrowing.  Left ventriculography: Left ventricular systolic function is normal, and hyperdynamic with a post VPB beat. No WMA. No obvious MR.   Final Conclusions:  1. Preserved LV function  2.  50% distal LMCA extending to the LAD  3. Moderately high grade first diagonal.  4. Involvement of two ramus subbranches as noted.  Recommendations:  1. Review films with Dr. Shirlee Latch. Anatomy not amenable to PCI, but role of surgery is unclear in this case.  [name]  03/16/2012, 5:27 PM    03/17/2012 ECHO Dobutamine Stress Test Study Conclusions  - Stress ECG conclusions: The stress ECG was normal. - Staged echo: There was no echocardiographic evidence for stress-induced ischemia. Impressions:  - Dobutamine echo with chest pain but no ST changes; hypotensive response to dobutamine infusion; patient did not achieve THR; no stress-induced wall motion abnormalities. Dobutamine. Stress echocardiography. 2D. Height: Height: 165.1cm. Height: 65in. Weight: Weight: 59.1kg. Weight:  130lb. Body mass index: BMI: 21.7kg/m^2. Body surface area: BSA: 1.32m^2. Blood pressure: 165/72. Patient status: Inpatient.    Admission HPI: Christine [name] is a 78 yo Woman with PMH of hypothyroidism, DM II (HbA1c 12/2011 was 6.8), HLP, migraine headache, HTN, GERD, CAD with cardiac cath 08/2011 (non flow limiting disease but 40-50% obstruction distal left main) presents to clinic for evaluation of her exertional dyspnea and fatigue and chest pain. Patient was also seen in clinic on 4/10 for similar complaints and her PCP referred patient back to her cardiologist, Dr. Shirlee Latch in which Imdur was increased from 30mg  to 60mg  and Metoprolol was decreased from 100 to 50mg .  Patient could not really describe her pain and was being vague stating that "my left chest hurts" and it is associated with minimal exertion. Her symptoms are intermittent. It has been going on for past 3-4 weeks worse in the past 2 days. Reduced exercise capacity to the point that she is not able to walk up the stairs at home. Pain radiates to her left & right arm pain and pain in the back of the neck. She describes this as her typical anginal pain. She  did not take nitroglycerin because she did not want to get a migraine headache. She endorses a cough without any sputum. She denies any leg edema, paroxysmal nocturnal dyspnea, lightheadedness, resting chest pain. She sleeps on 2 pillows at night. She denies any nausea, vomiting, diaphoresis, fever, or chills.  Admission Physical Exam:  Blood pressure 146/92, pulse 84, temperature 98.6 F (37 C), resp. rate 14, height 5\' 5"  (1.651 m), weight 135 lb 8 oz (61.462 kg), last menstrual period 02/10/1966, SpO2 100.00%.  General: alert, well-developed, and cooperative to examination.  Head: normocephalic and atraumatic.  Eyes: vision grossly intact, pupils equal, pupils round, pupils reactive to light, no injection and anicteric.  Mouth: pharynx pink and moist, no erythema, and no exudates.  Neck: supple, full ROM, no thyromegaly, no JVD, and no carotid bruits.  Lungs: normal respiratory effort, no accessory muscle use, normal breath sounds, no crackles, and no wheezes. Heart: distant heart sounds,normal rate, regular rhythm, no murmur, no gallop, and no rub. Tenderness to palpation to her left chest/breast area. Abdomen: soft, non-tender, normal bowel sounds, no distention, no guarding, no rebound tenderness Msk: no joint swelling, no joint warmth, and no redness over joints.  Pulses: 2+ DP/PT pulses bilaterally Extremities: No cyanosis, clubbing, edema Neurologic: alert & oriented X3, cranial nerves II-XII intact, strength normal in all extremities, sensation intact to light touch, and gait normal.  Skin: turgor normal and no rashes.  Psych: Oriented X3, memory intact for recent and remote, normally interactive, good eye contact, not anxious appearing, and ++ depressed appearing.   Hospital Course by problem list:  1. Chest pain- Christine [name] was admitted with symptoms consistent with unstable angina.  She was monitored with telemetry, had cycled cardiac enzymes which remained negative and EKG without  acute changes. [facility] was consulted. Repeat 2D echo indicated EF 60-65%, mild LVH, and grade I diastolic dysfunction. Given her symptoms and prior catheterization results demonstrating moderate disease, a repeat cardiac catheterization was performed on 03/16/2012. Catheterization showed 50% distal LM extending into ostial LAD with moderate disease in ostial ramus branches and D1. There did not seem to be significant change from her prior catheterization in Fall 2012. Coronary disease appeared moderate and anatomy not amenable to PCI. Per Cardiology, if her vessel disease were thought to be flow-limiting, a CABG would  be her only good option. In order to assess the functional significance of her coronary disease, a subsequent dobutamine stress echo was performed the following day which did NOT show evidence of stress-induced ischemia.  By day of discharge, she was without chest pain or shortness of breath.  She was instructed to increase her Imdur to 90 mg daily and Toprol XL to 100 mg daily, as well as continue valsartan 160 mg qd, simvastatin 40 mg qd and daily Plavix therapy. She is to follow-up with Dr. Shirlee Latch 04/15/2012 [facility]).  2. DIABETES MELLITUS, TYPE II- was well controlled during this admission her home regimen of glimepiride was continued and capillary blood glucose levels monitored. Her most recent HbA1c was 6.8 in Feb 2013.   3. HYPERTENSION- adequately controlled on home regimen ARB and adjustments of Toprol XL and Imdur for medical management of her CAD.   4. COPD: patient admitted with diagnosis of COPD previously managed with Advair. On review of her PFT's from 2011,  the pattern of flow was not consistent with obstruction.  Her FEV1/FVC 96%. She is to follow-up with the Internal Medicine Clinic for re-assessment and need for Advair.   Discharge Vitals:  BP 145/72  Pulse 73  Temp(Src) 97.9 F (36.6 C) (Oral)  Resp 20  Ht 5\' 5"  (1.651 m)  Wt 130 lb  6.4 oz (59.149 kg)  BMI 21.70 kg/m2  SpO2 100%  LMP 02/10/1966  Discharge Labs:  Results for orders placed during the hospital encounter of 03/13/12 (from the past 24 hour(s))  GLUCOSE, CAPILLARY     Status: Normal   Collection Time   03/16/12  3:01 PM      Component Value Range   Glucose-Capillary 83  70 - 99 (mg/dL)  POCT ACTIVATED CLOTTING TIME     Status: Normal   Collection Time   03/16/12  4:26 PM      Component Value Range   Activated Clotting Time 116    GLUCOSE, CAPILLARY     Status: Normal   Collection Time   03/16/12  4:41 PM      Component Value Range   Glucose-Capillary 81  70 - 99 (mg/dL)  GLUCOSE, CAPILLARY     Status: Abnormal   Collection Time   03/16/12  5:45 PM      Component Value Range   Glucose-Capillary 59 (*) 70 - 99 (mg/dL)   Comment 1 Notify RN    GLUCOSE, CAPILLARY     Status: Normal   Collection Time   03/16/12  6:20 PM      Component Value Range   Glucose-Capillary 84  70 - 99 (mg/dL)   Comment 1 Notify RN    GLUCOSE, CAPILLARY     Status: Normal   Collection Time   03/16/12  9:01 PM      Component Value Range   Glucose-Capillary 98  70 - 99 (mg/dL)   Comment 1 Notify RN    CBC     Status: Abnormal   Collection Time   03/17/12  7:20 AM      Component Value Range   WBC 5.9  4.0 - 10.5 (K/uL)   RBC 3.78 (*) 3.87 - 5.11 (MIL/uL)   Hemoglobin 11.1 (*) 12.0 - 15.0 (g/dL)   HCT 16.1 (*) 09.6 - 46.0 (%)   MCV 89.4  78.0 - 100.0 (fL)   MCH 29.4  26.0 - 34.0 (pg)   MCHC 32.8  30.0 - 36.0 (g/dL)   RDW 04.5  40.9 -  15.5 (%)   Platelets 262  150 - 400 (K/uL)  BASIC METABOLIC PANEL     Status: Abnormal   Collection Time   03/17/12  7:20 AM      Component Value Range   Sodium 138  135 - 145 (mEq/L)   Potassium 4.1  3.5 - 5.1 (mEq/L)   Chloride 103  96 - 112 (mEq/L)   CO2 24  19 - 32 (mEq/L)   Glucose, Bld 107 (*) 70 - 99 (mg/dL)   BUN 18  6 - 23 (mg/dL)   Creatinine, Ser 1.61  0.50 - 1.10 (mg/dL)   Calcium 9.5  8.4 - 09.6 (mg/dL)   GFR calc non  Af Amer 53 (*) >90 (mL/min)   GFR calc Af Amer 61 (*) >90 (mL/min)  GLUCOSE, CAPILLARY     Status: Abnormal   Collection Time   03/17/12  8:11 AM      Component Value Range   Glucose-Capillary 107 (*) 70 - 99 (mg/dL)  GLUCOSE, CAPILLARY     Status: Normal   Collection Time   03/17/12 10:53 AM      Component Value Range   Glucose-Capillary 84  70 - 99 (mg/dL)   Comment 1 Notify RN      Signed: Kristie Cowman 03/17/2012, 12:29 PM   Time Spent on Discharge: 40 min

## 2012-03-17 NOTE — Progress Notes (Signed)
Subjective: No acute events overnight.  Objective: Vital signs in last 24 hours: Filed Vitals:   03/16/12 1753 03/16/12 1814 03/16/12 2100 03/17/12 0500  BP: 162/70 159/55 165/79 150/57  Pulse: 71 68 75 75  Temp:   98 F (36.7 C) 97.9 F (36.6 C)  TempSrc:   Oral Oral  Resp:   20 20  Height:      Weight:      SpO2:   100% 99%   Weight change:   Intake/Output Summary (Last 24 hours) at 03/17/12 0739 Last data filed at 03/17/12 0500  Gross per 24 hour  Intake    973 ml  Output   1000 ml  Net    -27 ml   Physical Exam: General: alert, well-developed, and cooperative to examination.  Neck: supple, full ROM, no thyromegaly, no JVD, and no carotid bruits.  Lungs: normal respiratory effort, no accessory muscle use, normal breath sounds, no crackles, and no wheezes. Heart: normal rate, regular rhythm, no gallop, no murmur nor rub appreciated this morning.  Abdomen: soft, non-tender, normal bowel sounds, no distention, no guarding Extremities: warm, no LE edema, distal pulses intact, cath site benign Neurologic: alert & oriented X3, cranial nerves II-XII intact moving all 4 extremities  Lab Results: Basic Metabolic Panel:  Lab 03/16/12 4098 03/13/12 1809  NA 138 137  K 4.2 4.0  CL 102 99  CO2 25 29  GLUCOSE 99 100*  BUN 21 15  CREATININE 1.02 1.07  CALCIUM 9.9 10.2  MG -- --  PHOS -- --   Liver Function Tests:  Lab 03/13/12 1809  AST 19  ALT 20  ALKPHOS 161*  BILITOT 0.3  PROT 8.0  ALBUMIN 4.3   CBC:  Lab 03/16/12 0715 03/15/12 0605 03/13/12 1809  WBC 6.5 8.7 --  NEUTROABS -- -- 3.2  HGB 12.6 13.0 --  HCT 38.4 39.3 --  MCV 90.1 89.3 --  PLT 317 326 --   Cardiac Enzymes:  Lab 03/14/12 0935 03/14/12 0230 03/13/12 1809  CKTOTAL 175 205* 217*  CKMB 2.7 3.0 3.4  CKMBINDEX -- -- --  TROPONINI <0.30 <0.30 <0.30   BNP:  Lab 03/13/12 1809  PROBNP 115.3   CBG:  Lab 03/16/12 2101 03/16/12 1820 03/16/12 1745 03/16/12 1641 03/16/12 1501 03/16/12 1143    GLUCAP 98 84 59* 81 83 103*   Hemoglobin A1C:  Lab 03/13/12 1809  HGBA1C 6.8*  Thyroid Function Tests:  Lab 03/13/12 1809  TSH 2.168  T4TOTAL --  FREET4 --  T3FREE --  THYROIDAB --   Anemia Panel:  Lab 03/15/12 0837 03/14/12 0955  VITAMINB12 937* --  FOLATE -- >20.0  FERRITIN -- 90  TIBC -- 272  IRON -- 53  RETICCTPCT -- 1.4    Medications:  Scheduled Meds:    . atorvastatin  40 mg Oral QPM  . clopidogrel  75 mg Oral Daily  . diazepam  5 mg Oral On Call  . docusate sodium  100 mg Oral Daily  . Fluticasone-Salmeterol  1 puff Inhalation Q12H  . heparin      . hydrALAZINE  5 mg Intravenous Once  . imipramine  100 mg Oral BID  . insulin aspart  0-9 Units Subcutaneous TID WC  . irbesartan  150 mg Oral Daily  . isosorbide mononitrate  60 mg Oral Daily  . levothyroxine  25 mcg Oral Daily  . lidocaine      . loratadine  10 mg Oral Daily  . metoprolol succinate  50 mg Oral Daily  . midazolam      . nitroGLYCERIN      . pantoprazole  40 mg Oral Daily  . DISCONTD: mulitivitamin with minerals  1 tablet Oral Daily  . DISCONTD: sodium chloride  3 mL Intravenous Q12H   Continuous Infusions:    . sodium chloride 150 mL/hr at 03/16/12 1916  . DISCONTD: sodium chloride 75 mL/hr at 03/16/12 0658  . DISCONTD: heparin Stopped (03/16/12 1458)   PRN Meds:.acetaminophen, albuterol, guaiFENesin-dextromethorphan, ipratropium, morphine injection, nitroGLYCERIN, ondansetron (ZOFRAN) IV, polyethylene glycol, DISCONTD: sodium chloride, DISCONTD: acetaminophen, DISCONTD: ondansetron (ZOFRAN) IV, DISCONTD: ondansetron, DISCONTD: sodium chloride  Assessment/Plan: HD#3 for 78 yo female with h/o nonobstructive CAD s/p cath 08/2011, Diabetes Mellitus, and chronic diastolic CHF, admitted with likely unstable angina.  1. Probable unstable angina- no complaints of chest pain and sob today, Repeat 2D echo: Ef 60-65%, mild LVH, grade I diastolic dysfunction, cardiac cath demonstrates anatomy  not amenable to PCI -Will continue ASA, Plavix, BB, ARB, Imdur, and statin -Nuclear Stress Test today -appreciate Cardiology recs  2. HYPOTHYROIDISM- stable, Last TSH was 2.72 in 01/30/2012.  -Will continue current dose of Synthroid qAM   3.DIABETES MELLITUS, TYPE II- well controlled, CBGs 103-117, HbA1c was 6.8 in 12/2011.  -cont SSI sensitive   4. HYPERTENSION- adequately controlled, increase Imdur to 90 mg and Toprol XL to 100 mg qd per Cards recs -Will continue current home meds  5. Anemia: likely iron deficiency anemia. Hb is stable at ~13.  -Will continue to monitor CBC Iron 53 TIBC 272 Sat Ratio 19 UIBC 219 Folate >20 Ferritin 90 Vit B12: 937  6. Disposition: currently stable, likely d/c after nuclears stress test if normal -f/u scheduled with Dr. Baltazar Apo 03/23/2012 (Internal Medicine Clinic) -f/u scheduled with Dr. Shirlee Latch 04/15/2012 [facility])  DVT ppx: Heparin gtt   LOS: 4 days   Antoneo Ghrist 03/17/2012, 7:39 AM

## 2012-03-23 ENCOUNTER — Encounter: Payer: Self-pay | Admitting: Ophthalmology

## 2012-03-23 ENCOUNTER — Ambulatory Visit (INDEPENDENT_AMBULATORY_CARE_PROVIDER_SITE_OTHER): Payer: Medicare Other | Admitting: Ophthalmology

## 2012-03-23 ENCOUNTER — Encounter: Payer: Medicare Other | Admitting: Internal Medicine

## 2012-03-23 VITALS — BP 109/59 | HR 75 | Temp 97.4°F | Ht 65.0 in | Wt 139.7 lb

## 2012-03-23 DIAGNOSIS — R0609 Other forms of dyspnea: Secondary | ICD-10-CM

## 2012-03-23 DIAGNOSIS — R5383 Other fatigue: Secondary | ICD-10-CM

## 2012-03-23 DIAGNOSIS — E119 Type 2 diabetes mellitus without complications: Secondary | ICD-10-CM | POA: Diagnosis not present

## 2012-03-23 DIAGNOSIS — R0600 Dyspnea, unspecified: Secondary | ICD-10-CM

## 2012-03-23 DIAGNOSIS — R0989 Other specified symptoms and signs involving the circulatory and respiratory systems: Secondary | ICD-10-CM

## 2012-03-23 DIAGNOSIS — R5381 Other malaise: Secondary | ICD-10-CM | POA: Diagnosis not present

## 2012-03-23 LAB — FERRITIN: Ferritin: 90 ng/mL (ref 10–291)

## 2012-03-23 LAB — TSH: TSH: 2.168 u[IU]/mL (ref 0.350–4.500)

## 2012-03-23 LAB — GLUCOSE, CAPILLARY: Glucose-Capillary: 171 mg/dL — ABNORMAL HIGH (ref 70–99)

## 2012-03-23 LAB — IRON AND TIBC: Iron: 53 ug/dL (ref 42–135)

## 2012-03-23 LAB — VITAMIN B12: Vitamin B-12: 937 pg/mL — ABNORMAL HIGH (ref 211–911)

## 2012-03-23 NOTE — Patient Instructions (Signed)
-  Please get lung function test

## 2012-03-23 NOTE — Progress Notes (Signed)
Subjective:   [name] female   DOB: 04/03/1933 78 y.o.   MRN: [id number]  HPI: Christine [name] is a 78 y.o. woman who presents for hospital follow up for chest pain.   Her chest pain is improved, she is able to walk around without pain. Has some central chest pain which is mild. She thinks it occurs mostly when she is up and walking around.  Breathing- patient is feeling that she is tired and short of breath with walking around the house. Was worse last Thursday and Friday. No fever, cough, wheeze at that time. She has been using only advair. Hgb 11.1. Sleeping okay, denies any depession. She says she had back pain before she was diagnosed with COPD and this improved with the advair. She feels that the advair helped tremendously with her breathing. She said she couldn't go up steps before due to shortness of breath and now she can several times a day.  Patient denies any history of lupus, or sarcoidosis, grandmother had 'arthritis' that was mostly in hands in her 64s. She also reports her daughter has pulmonary hypertension relating to diet pill.  Past Medical History  Diagnosis Date  . GERD (gastroesophageal reflux disease)   . Hypertension   . Hypothyroidism   . Anemia   . Hyperlipidemia   . Sinus arrhythmia   . History of migraine headaches     Next atypical symptoms in the past. Patient was started on Neurontin for possible neuropathic origin of her pain.  Marland Kitchen COPD (chronic obstructive pulmonary disease)     PFTs 2011 show FEV1/FVC of 96%  . Heart murmur, systolic     2-D echo in December 2011 showed a normal EF with grade 1 diastolic dysfunction, trivial pulmonary regurgitation and mildly elevated PA pressure at 37 mmHg probably secondary to her COPD.dynamic obstruction-mid cavity obliteration,   . CAD (coronary artery disease)     LHC 08/23/11: dLM 40-50%, oRI 40%, oCFX 40%, oD1 70% (small and not amenable to PCI).  LM lesion did not appear to be flow limiting.   Medical rx was recommended.  Echo 08/23/11: mild LVH, EF 60-65%, grade 1 diast dysfxn, mild BAE, PASP 24.    . Carotid stenosis     dopplers 10/12:  0-39% bilat ICA (repeat 08/2012)  . Migraines   . Heart murmur   . Angina   . Chronic bronchitis   . Shortness of breath on exertion   . Type II diabetes mellitus     On oral hypoglycemic agents.  . Blood transfusion   . History of stomach ulcers 1970's  . Arthritis   . Polymyalgia rheumatica    Current Outpatient Prescriptions  Medication Sig Dispense Refill  . acetaminophen (TYLENOL) 500 MG tablet Take 1,000 mg by mouth every 6 (six) hours as needed. For pain      . atorvastatin (LIPITOR) 40 MG tablet Take 40 mg by mouth every evening.      . clopidogrel (PLAVIX) 75 MG tablet Take 75 mg by mouth daily.      . diphenhydramine-acetaminophen (TYLENOL PM) 25-500 MG TABS Take 1 tablet by mouth at bedtime as needed. For pain/sleep      . esomeprazole (NEXIUM) 40 MG capsule Take 40 mg by mouth daily before breakfast.      . Fluticasone-Salmeterol (ADVAIR DISKUS) 250-50 MCG/DOSE AEPB Inhale 1 puff into the lungs every 12 (twelve) hours.      Marland Kitchen glimepiride (AMARYL) 1 MG tablet Take 1 mg  by mouth daily before breakfast.      . imipramine (TOFRANIL) 50 MG tablet Take 100 mg by mouth 2 (two) times daily.      . isosorbide mononitrate (IMDUR) 30 MG 24 hr tablet Take 3 tablets (90 mg total) by mouth daily.  120 tablet  6  . levothyroxine (SYNTHROID, LEVOTHROID) 25 MCG tablet Take 25 mcg by mouth daily.      Marland Kitchen loratadine (CLARITIN) 10 MG tablet Take 10 mg by mouth daily as needed. For allergies      . metoprolol succinate (TOPROL-XL) 100 MG 24 hr tablet Take 1 tablet (100 mg total) by mouth daily. Take with or immediately following a meal.  30 tablet  11  . Multiple Vitamin (MULITIVITAMIN WITH MINERALS) TABS Take 1 tablet by mouth daily.      . nitroGLYCERIN (NITROSTAT) 0.4 MG SL tablet Place 0.4 mg under the tongue every 5 (five) minutes as needed. For  chest pain      . valsartan (DIOVAN) 160 MG tablet Take 160 mg by mouth daily.      . vitamin C (ASCORBIC ACID) 500 MG tablet Take 500 mg by mouth daily.       Family History  Problem Relation Age of Onset  . Cancer Mother 16     cancer of the colon  . COPD Father 70   History   Social History  . Marital Status: Widowed    Spouse Name: N/A    Number of Children: N/A  . Years of Education: N/A   Social History Main Topics  . Smoking status: Never Smoker   . Smokeless tobacco: Never Used  . Alcohol Use: Yes     "Last drink 1967"  . Drug Use: No  . Sexually Active: No   Other Topics Concern  . None   Social History Narrative    Never smoked. Care for her daughter who has had a lung transplant. 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

## 2012-03-24 ENCOUNTER — Encounter: Payer: Self-pay | Admitting: Ophthalmology

## 2012-03-24 DIAGNOSIS — R0600 Dyspnea, unspecified: Secondary | ICD-10-CM | POA: Insufficient documentation

## 2012-03-24 NOTE — Assessment & Plan Note (Signed)
Patient's metoprolol was lowered with no improvement in her symptoms. No obvious other cause in terms of depression, uncontrolled hypothyroidism or anemia. Could consider starting SSRI if patient relates depressive symptoms. Denies currently.

## 2012-03-24 NOTE — Assessment & Plan Note (Addendum)
Patient had previously been told that she had COPD but she has never smoked and her PFTs showed FEV1/FVC of 96% and FEV1 was 97% of predicted. She could have a restrictive lung disease with this normal FEV/FVC ratio if she has reduced lung volumes. Referred her for full PFTs including lung volumes and response to bronchodilator and diffusion capacity since she had only spirometry done initially. If nothing is found on PFT, her main cause may be cardiac since she has known CAD with anginal symptoms that could present with dyspnea.

## 2012-03-24 NOTE — Assessment & Plan Note (Signed)
Patient's blood sugar was elevated at 171. Please check A1c at next visit since metformin was stopped and her diabetes may not be under good control any longer. This could also be contributing to her tiredness.

## 2012-03-25 NOTE — Progress Notes (Signed)
Patient seen prior to cath.

## 2012-04-01 ENCOUNTER — Other Ambulatory Visit: Payer: Self-pay | Admitting: *Deleted

## 2012-04-01 MED ORDER — GLUCOSE BLOOD VI STRP: ORAL_STRIP | Status: DC

## 2012-04-03 ENCOUNTER — Ambulatory Visit (HOSPITAL_COMMUNITY)
Admission: RE | Admit: 2012-04-03 | Discharge: 2012-04-03 | Disposition: A | Discharge status: Home / Self Care | Payer: Medicare Other | Source: Ambulatory Visit | Attending: Internal Medicine | Admitting: Internal Medicine

## 2012-04-03 DIAGNOSIS — R0989 Other specified symptoms and signs involving the circulatory and respiratory systems: Secondary | ICD-10-CM | POA: Insufficient documentation

## 2012-04-03 DIAGNOSIS — R5383 Other fatigue: Secondary | ICD-10-CM

## 2012-04-03 DIAGNOSIS — R0609 Other forms of dyspnea: Secondary | ICD-10-CM | POA: Insufficient documentation

## 2012-04-03 DIAGNOSIS — R0600 Dyspnea, unspecified: Secondary | ICD-10-CM

## 2012-04-03 MED ORDER — ALBUTEROL SULFATE (5 MG/ML) 0.5% IN NEBU
2.5000 mg | INHALATION_SOLUTION | Freq: Once | RESPIRATORY_TRACT | Status: AC
  Administered 2012-04-03: 2.5 mg via RESPIRATORY_TRACT

## 2012-04-07 ENCOUNTER — Other Ambulatory Visit: Payer: Self-pay | Admitting: Internal Medicine

## 2012-04-15 ENCOUNTER — Ambulatory Visit (INDEPENDENT_AMBULATORY_CARE_PROVIDER_SITE_OTHER): Payer: Medicare Other | Admitting: Cardiology

## 2012-04-15 ENCOUNTER — Encounter: Payer: Self-pay | Admitting: Cardiology

## 2012-04-15 VITALS — BP 122/62 | HR 80 | Ht 65.0 in | Wt 138.8 lb

## 2012-04-15 DIAGNOSIS — I2510 Atherosclerotic heart disease of native coronary artery without angina pectoris: Secondary | ICD-10-CM | POA: Diagnosis not present

## 2012-04-15 DIAGNOSIS — E785 Hyperlipidemia, unspecified: Secondary | ICD-10-CM

## 2012-04-15 NOTE — Patient Instructions (Signed)
Your physician recommends that you schedule a follow-up appointment in: 3 months with Dr McLean  

## 2012-04-17 NOTE — Assessment & Plan Note (Signed)
LDL recently at goal (<70) on current statin dose.

## 2012-04-17 NOTE — Progress Notes (Signed)
PCP: Dr. Scot Dock  78 yo with history of CAD, HTN, and diabetes presents for cardiology followup.  I initially saw Christine [name] in the hospital in 10/12 with chest pain.  Left heart cath showed a 40-50% distal left main stenosis that was not thought to be flow-limiting.  She additionally had a 75% stenosis in a diagonal.  Chest pain resolved with medical therapy.  She was admitted again in 4/13 with chest pain and exertional dyspnea.  Cardiac enzymes were negative.  LHC looked similar to prior with 50% distal LM, 70% ostial D1, and 70% ostial ramus.  Echo showed preserved EF.  A dobutamine stress echo was done, this was submaximal but did not show evidence for ischemia.  No intervention, planned medical management.   Anti-anginal meds were titrated up and she was discharged home.    Since getting home, she has had only very rare chest pain, none exertional.  She is also much less short of breath.  She feels much better overall.  She has been going to church, walking in the mall, and out to eat with family.   Labs (2/13): K 3.3, creatinine 0.9 Labs (3/13): HDL 42, LDL 49 Labs (4/13): K 4.1, creatinine 1.01  PMH: 1. CAD: Chest pain 10/12.  LHC with 40-50% distal LM stenosis, 70-75% ostial D1.  No flow-limiting lesions, medically managed.  Echo (10/12): EF 60-65%, mild LV hypertrophy, PA systolic pressure 24 mmHg.  Chest pain 4/13.  LHC with 50% distal LM, 70% ostial D1, 50% proximal LAD, 70% proximal ramus.  No flow-limiting lesions, medically managed.  Echo (4/13) with EF 60-65%, mild LVH.  DSE (4/13) with no ischemia but it was a submaximal study.  2. DM2 3. HTN 4. Hyperlipidemia 5. CKD 6. Carotid dopplers (10/12): 0-39% bilateral carotid stenosis.  7. Allergy to aspirin => rash.  8. GERD 9. Migraines 10. Hypothyroidism 11. COPD  SH: Nonsmoker.  Lives in [place].   FH: CAD  ROS: All systems reviewed and negative except as per HPI.   Current Outpatient Prescriptions  Medication Sig  Dispense Refill  . acetaminophen (TYLENOL) 500 MG tablet Take 1,000 mg by mouth every 6 (six) hours as needed. For pain      . atorvastatin (LIPITOR) 40 MG tablet Take 40 mg by mouth every evening.      . clopidogrel (PLAVIX) 75 MG tablet Take 75 mg by mouth daily.      . diphenhydramine-acetaminophen (TYLENOL PM) 25-500 MG TABS Take 1 tablet by mouth at bedtime as needed. For pain/sleep      . esomeprazole (NEXIUM) 40 MG capsule Take 40 mg by mouth daily before breakfast.      . Fluticasone-Salmeterol (ADVAIR DISKUS) 250-50 MCG/DOSE AEPB Inhale 1 puff into the lungs every 12 (twelve) hours.      Marland Kitchen glimepiride (AMARYL) 1 MG tablet Take 1 mg by mouth daily before breakfast.      . glucose blood (ONE TOUCH ULTRA TEST) test strip Use as instructed  100 each  12  . imipramine (TOFRANIL) 50 MG tablet Take 100 mg by mouth 2 (two) times daily.      . isosorbide mononitrate (IMDUR) 30 MG 24 hr tablet Take 3 tablets (90 mg total) by mouth daily.  120 tablet  6  . levothyroxine (SYNTHROID, LEVOTHROID) 25 MCG tablet Take 25 mcg by mouth daily.      Marland Kitchen loratadine (CLARITIN) 10 MG tablet Take 10 mg by mouth daily as needed. For allergies      .  metoprolol succinate (TOPROL-XL) 100 MG 24 hr tablet Take 1 tablet (100 mg total) by mouth daily. Take with or immediately following a meal.  30 tablet  11  . Multiple Vitamin (MULITIVITAMIN WITH MINERALS) TABS Take 1 tablet by mouth daily.      . nitroGLYCERIN (NITROSTAT) 0.4 MG SL tablet Place 0.4 mg under the tongue every 5 (five) minutes as needed. For chest pain      . valsartan (DIOVAN) 160 MG tablet Take 160 mg by mouth daily.      . VENTOLIN HFA 108 (90 BASE) MCG/ACT inhaler inhale 2 puffs by mouth every 4 to 6 hours if needed for shortness of breath  18 g  3  . vitamin C (ASCORBIC ACID) 500 MG tablet Take 500 mg by mouth daily.       BP 122/62  Pulse 80  Ht 5\' 5"  (1.651 m)  Wt 62.959 kg (138 lb 12.8 oz)  BMI 23.10 kg/m2  LMP 02/10/1966 General: NAD Neck:  No JVD, no thyromegaly or thyroid nodule.  Lungs: Clear to auscultation bilaterally with normal respiratory effort. CV: Nondisplaced PMI.  Heart regular S1/S2, no S3/S4, 1/6 SEM.  No peripheral edema.  No carotid bruit.  Normal pedal pulses.  Abdomen: Soft, nontender, no hepatosplenomegaly, no distention.  Neurologic: Alert and oriented x 3.  Psych: Normal affect. Extremities: No clubbing or cyanosis.

## 2012-04-17 NOTE — Assessment & Plan Note (Signed)
Non-flow limiting, moderate disease on cath in 4/13.  DSE was negative for ischemia but it was a submaximal study.  Imdur and Toprol were titrated up and patient reports considerably improved chest pain and dyspnea.  It is possible that she has microvascular angina.  Continue Plavix (allergic to aspirin), Imdur, Toprol XL, valsartan, and atorvastatin.  If there is more chest pain, I would get a Lexiscan myoview.

## 2012-04-21 ENCOUNTER — Ambulatory Visit (INDEPENDENT_AMBULATORY_CARE_PROVIDER_SITE_OTHER): Payer: Medicare Other | Admitting: Internal Medicine

## 2012-04-21 ENCOUNTER — Encounter: Payer: Self-pay | Admitting: Internal Medicine

## 2012-04-21 VITALS — BP 124/66 | HR 80 | Temp 97.5°F | Ht 65.0 in | Wt 139.1 lb

## 2012-04-21 DIAGNOSIS — R5381 Other malaise: Secondary | ICD-10-CM | POA: Diagnosis not present

## 2012-04-21 DIAGNOSIS — I10 Essential (primary) hypertension: Secondary | ICD-10-CM | POA: Diagnosis not present

## 2012-04-21 DIAGNOSIS — E119 Type 2 diabetes mellitus without complications: Secondary | ICD-10-CM

## 2012-04-21 DIAGNOSIS — I2510 Atherosclerotic heart disease of native coronary artery without angina pectoris: Secondary | ICD-10-CM | POA: Diagnosis not present

## 2012-04-21 DIAGNOSIS — R5383 Other fatigue: Secondary | ICD-10-CM

## 2012-04-21 LAB — CBC
MCV: 90.3 fL (ref 78.0–100.0)
Platelets: 301 10*3/uL (ref 150–400)
RBC: 3.71 MIL/uL — ABNORMAL LOW (ref 3.87–5.11)
RDW: 14 % (ref 11.5–15.5)
WBC: 5.9 10*3/uL (ref 4.0–10.5)

## 2012-04-21 LAB — GLUCOSE, CAPILLARY

## 2012-04-21 NOTE — Progress Notes (Signed)
[name], female   DOB: [birth year], 78 y.o.   MRN: [id number]  78 Y/o w with pmh listed below comes for checkup No complaints today Since hospitalization in April, she has been doing significantly better. Her cath results are outlined below but basically her CAD has progressed mildly without any flow limiting obstruction. Her imdur dose was increased from 30 qd to 30 mg tid and metoprolol was increased to 100 mg qd from 50 mg qd during the hospitalization. She continues to follow Dr. Shirlee Latch for her CAD.  Complaint with medications No medications side effect Uptodate on refills See individual a/p for further details.   Physical exam  General Appearance:     Filed Vitals:   04/21/12 1353  BP: 124/66  Pulse: 80  Temp: 97.5 F (36.4 C)  TempSrc: Oral  Height: 5\' 5"  (1.651 m)  Weight: 139 lb 1.6 oz (63.095 kg)  SpO2: 98%     Alert, cooperative, no distress, appears stated age  Head:    Normocephalic, without obvious abnormality, atraumatic  Eyes:    PERRL, conjunctiva/corneas clear, EOM's intact, fundi    benign, both eyes       Neck:   Supple, symmetrical, trachea midline, no adenopathy;       thyroid:  No enlargement/tenderness/nodules; no carotid   bruit or JVD  Lungs:     Clear to auscultation bilaterally, respirations unlabored  Chest wall:    No tenderness or deformity  Heart:    Regular rate and rhythm, S1 and S2 normal, no murmur, rub   or gallop  Abdomen:     Soft, non-tender, bowel sounds active all four quadrants,    no masses, no organomegaly  Extremities:   Extremities normal, atraumatic, no cyanosis or edema  Pulses:   2+ and symmetric all extremities  Skin:   Skin color, texture, turgor normal, no rashes or lesions  Neurologic:  nonfocal grossly    ROS  Constitutional: Denies fever, chills, diaphoresis, appetite change and fatigue.  Respiratory: Denies SOB, DOE, cough, chest tightness,  and wheezing.   Cardiovascular: Denies chest pain,  palpitations and leg swelling.  Gastrointestinal: Denies nausea, vomiting, abdominal pain, diarrhea, constipation, blood in stool and abdominal distention.  Skin: Denies pallor, rash and wound.  Neurological: Denies dizziness, light-headedness, numbness and headaches.

## 2012-04-21 NOTE — Assessment & Plan Note (Signed)
CBGs 80-140. No hypoglycemia. Continue current medications

## 2012-04-21 NOTE — Assessment & Plan Note (Signed)
symptoms significantly improved since increasing imdur to current dose. Continue to follow clinically.

## 2012-04-21 NOTE — Assessment & Plan Note (Signed)
controlled 

## 2012-04-21 NOTE — Assessment & Plan Note (Signed)
Improved,. Looking back at the records,  CBC had dropped by a 1 gram at the time of hospitalization before discharged. Will recheck it today to make sure its stabilized,.

## 2012-04-22 NOTE — Progress Notes (Signed)
Quick Note:  Follow up Hemoglobin at next visit, she is a little below her baseline after the hospitalization without h/o overt bleeding.   ______

## 2012-05-13 ENCOUNTER — Other Ambulatory Visit: Payer: Self-pay | Admitting: Internal Medicine

## 2012-05-13 DIAGNOSIS — Z1231 Encounter for screening mammogram for malignant neoplasm of breast: Secondary | ICD-10-CM

## 2012-05-25 ENCOUNTER — Other Ambulatory Visit: Payer: Self-pay | Admitting: *Deleted

## 2012-05-25 MED ORDER — FLUTICASONE-SALMETEROL 250-50 MCG/DOSE IN AEPB: 1.0000 | INHALATION_SPRAY | Freq: Two times a day (BID) | RESPIRATORY_TRACT | Status: DC

## 2012-06-12 DIAGNOSIS — E119 Type 2 diabetes mellitus without complications: Secondary | ICD-10-CM | POA: Diagnosis not present

## 2012-06-23 ENCOUNTER — Ambulatory Visit (HOSPITAL_COMMUNITY): Payer: Medicare Other

## 2012-06-25 ENCOUNTER — Other Ambulatory Visit: Payer: Self-pay | Admitting: *Deleted

## 2012-06-29 MED ORDER — IMIPRAMINE HCL 50 MG PO TABS: 100.0000 mg | ORAL_TABLET | Freq: Two times a day (BID) | ORAL | Status: DC

## 2012-07-10 DIAGNOSIS — M25819 Other specified joint disorders, unspecified shoulder: Secondary | ICD-10-CM | POA: Diagnosis not present

## 2012-07-15 ENCOUNTER — Ambulatory Visit (HOSPITAL_COMMUNITY): Payer: Medicare Other

## 2012-07-15 ENCOUNTER — Other Ambulatory Visit: Payer: Self-pay | Admitting: *Deleted

## 2012-07-16 MED ORDER — CLOPIDOGREL BISULFATE 75 MG PO TABS: 75.0000 mg | ORAL_TABLET | Freq: Every day | ORAL | Status: DC

## 2012-07-17 ENCOUNTER — Ambulatory Visit: Payer: Medicare Other | Admitting: Cardiology

## 2012-07-23 ENCOUNTER — Other Ambulatory Visit: Payer: Self-pay | Admitting: Internal Medicine

## 2012-07-28 ENCOUNTER — Ambulatory Visit (INDEPENDENT_AMBULATORY_CARE_PROVIDER_SITE_OTHER): Payer: Medicare Other | Admitting: Cardiology

## 2012-07-28 ENCOUNTER — Encounter: Payer: Self-pay | Admitting: Cardiology

## 2012-07-28 VITALS — BP 132/64 | HR 84 | Ht 65.0 in | Wt 136.0 lb

## 2012-07-28 DIAGNOSIS — E785 Hyperlipidemia, unspecified: Secondary | ICD-10-CM

## 2012-07-28 DIAGNOSIS — I2510 Atherosclerotic heart disease of native coronary artery without angina pectoris: Secondary | ICD-10-CM

## 2012-07-28 MED ORDER — PANTOPRAZOLE SODIUM 40 MG PO TBEC: 40.0000 mg | DELAYED_RELEASE_TABLET | Freq: Every day | ORAL | Status: DC

## 2012-07-28 NOTE — Progress Notes (Signed)
[name], female   DOB: 01/24/1934, 78 y.o.   MRN: [id number] PCP: Dr. Scot Dock  78 yo with history of CAD, HTN, and diabetes presents for cardiology followup.  I initially saw Christine [name] in the hospital in 10/12 with chest pain.  Left heart cath showed a 40-50% distal left main stenosis that was not thought to be flow-limiting.  She additionally had a 75% stenosis in a diagonal.  Chest pain resolved with medical therapy.  She was admitted again in 4/13 with chest pain and exertional dyspnea.  Cardiac enzymes were negative.  LHC looked similar to prior with 50% distal LM, 70% ostial D1, and 70% ostial ramus.  Echo showed preserved EF.  A dobutamine stress echo was done, this was submaximal but did not show evidence for ischemia.  No intervention, planned medical management.   Anti-anginal meds were titrated up and she was discharged home.    Since getting home, she has had only mild, rare chest pain, none exertional.  She has not taken NTG.  She is also much less short of breath.  She feels much better overall.  She has been walking for 15-20 minutes at a time for exercise.  She went to the beach this summer with her daughter.    Labs (2/13): K 3.3, creatinine 0.9 Labs (3/13): HDL 42, LDL 49 Labs (4/13): K 4.1, creatinine 1.01  PMH: 1. CAD: Chest pain 10/12.  LHC with 40-50% distal LM stenosis, 70-75% ostial D1.  No flow-limiting lesions, medically managed.  Echo (10/12): EF 60-65%, mild LV hypertrophy, PA systolic pressure 24 mmHg.  Chest pain 4/13.  LHC with 50% distal LM, 70% ostial D1, 50% proximal LAD, 70% proximal ramus.  No flow-limiting lesions, medically managed.  Echo (4/13) with EF 60-65%, mild LVH.  DSE (4/13) with no ischemia but it was a submaximal study.  2. DM2 3. HTN 4. Hyperlipidemia 5. CKD 6. Carotid dopplers (10/12): 0-39% bilateral carotid stenosis.  7. Allergy to aspirin => rash.  8. GERD 9. Migraines 10. Hypothyroidism 11. COPD  SH: Nonsmoker.  Lives in  [place].   FH: CAD  Current Outpatient Prescriptions  Medication Sig Dispense Refill  . acetaminophen (TYLENOL) 500 MG tablet Take 1,000 mg by mouth every 6 (six) hours as needed. For pain      . ADVAIR DISKUS 250-50 MCG/DOSE AEPB inhale 1 dose by mouth every 12 hours  60 each  2  . atorvastatin (LIPITOR) 40 MG tablet Take 40 mg by mouth every evening.      . clopidogrel (PLAVIX) 75 MG tablet Take 1 tablet (75 mg total) by mouth daily.  30 tablet  5  . diphenhydramine-acetaminophen (TYLENOL PM) 25-500 MG TABS Take 1 tablet by mouth at bedtime as needed. For pain/sleep      . glimepiride (AMARYL) 1 MG tablet Take 1 mg by mouth daily before breakfast.      . glucose blood (ONE TOUCH ULTRA TEST) test strip Use as instructed  100 each  12  . imipramine (TOFRANIL) 50 MG tablet Take 2 tablets (100 mg total) by mouth 2 (two) times daily.  120 tablet  3  . isosorbide mononitrate (IMDUR) 30 MG 24 hr tablet Take 3 tablets (90 mg total) by mouth daily.  120 tablet  6  . levothyroxine (SYNTHROID, LEVOTHROID) 25 MCG tablet Take 25 mcg by mouth daily.      Marland Kitchen loratadine (CLARITIN) 10 MG tablet Take 10 mg by mouth daily as needed. For allergies      .  metoprolol succinate (TOPROL-XL) 100 MG 24 hr tablet Take 1 tablet (100 mg total) by mouth daily. Take with or immediately following a meal.  30 tablet  11  . Multiple Vitamin (MULITIVITAMIN WITH MINERALS) TABS Take 1 tablet by mouth daily.      . nitroGLYCERIN (NITROSTAT) 0.4 MG SL tablet Place 0.4 mg under the tongue every 5 (five) minutes as needed. For chest pain      . valsartan (DIOVAN) 160 MG tablet Take 160 mg by mouth daily.      . VENTOLIN HFA 108 (90 BASE) MCG/ACT inhaler inhale 2 puffs by mouth every 4 to 6 hours if needed for shortness of breath  18 g  3  . vitamin C (ASCORBIC ACID) 500 MG tablet Take 500 mg by mouth daily.      . pantoprazole (PROTONIX) 40 MG tablet Take 1 tablet (40 mg total) by mouth daily.  30 tablet  6   BP 132/64  Pulse  84  Ht 5\' 5"  (1.651 m)  Wt 136 lb (61.689 kg)  BMI 22.63 kg/m2  LMP 02/10/1966 General: NAD Neck: No JVD, no thyromegaly or thyroid nodule.  Lungs: Clear to auscultation bilaterally with normal respiratory effort. CV: Nondisplaced PMI.  Heart regular S1/S2, no S3/S4, 2/6 early SEM.  No peripheral edema.  No carotid bruit.  1+ PT bilaterally.   Abdomen: Soft, nontender, no hepatosplenomegaly, no distention.  Neurologic: Alert and oriented x 3.  Psych: Normal affect. Extremities: No clubbing or cyanosis.

## 2012-07-28 NOTE — Assessment & Plan Note (Signed)
Check lipids in 10/13 with goal LDL < 70.

## 2012-07-28 NOTE — Patient Instructions (Addendum)
Stop nexium. This can have an interaction with Plavix.  Start protonix 40mg  daily. This is in the place of nexium.  Your physician recommends that you return for a FASTING lipid profile: in October 2013.   Your physician wants you to follow-up in: 6 months with Dr Shirlee Latch. (March 2014). You will receive a reminder letter in the mail two months in advance. If you don't receive a letter, please call our office to schedule the follow-up appointment.

## 2012-07-28 NOTE — Assessment & Plan Note (Signed)
Non-flow limiting, moderate disease on cath in 4/13.  DSE was negative for ischemia but it was a submaximal study.  Imdur and Toprol were titrated up and patient reports considerably improved chest pain and dyspnea (no exertional pain since leaving hospital - only mild occasional atypical pain).  It is possible that she has microvascular angina.  Continue Plavix (allergic to aspirin), Imdur, Toprol XL, valsartan, and atorvastatin.  If there is more chest pain, I would get a Lexiscan myoview.  I will also have her stop Nexium and use Protonix for GERD instead given potential for interaction with Plavix.

## 2012-07-29 ENCOUNTER — Ambulatory Visit (HOSPITAL_COMMUNITY): Payer: Medicare Other

## 2012-07-31 ENCOUNTER — Telehealth: Payer: Self-pay | Admitting: Cardiology

## 2012-07-31 NOTE — Telephone Encounter (Signed)
Spoke with pt. Pt states nexium was changed to protonix 07/28/12. Protonix is not covered by her insurance. She states she has not taken nexium or protonix since Monday. She states she seems to be doing fine without indigestion,burning in her throat or other symptoms.

## 2012-07-31 NOTE — Telephone Encounter (Signed)
Please return call to patient regarding medication change, she can be reached at hm#

## 2012-07-31 NOTE — Telephone Encounter (Signed)
She is OK with staying off med and seeing how she does. I will forward to Dr Shirlee Latch for review and recommendations.

## 2012-08-10 MED ORDER — DEXLANSOPRAZOLE 30 MG PO CPDR: 30.0000 mg | DELAYED_RELEASE_CAPSULE | Freq: Every day | ORAL | Status: DC

## 2012-08-10 NOTE — Telephone Encounter (Signed)
Pt informed we will get insurance to auth, pt states the cp is not like heart pain, she confirms it is acid reflux pain, I called pharmacy for insurance number for prior auth, she states she will refax it.

## 2012-08-10 NOTE — Telephone Encounter (Signed)
No fax yet

## 2012-08-10 NOTE — Telephone Encounter (Signed)
Dexilant is fine, may use at lowest dose to start.

## 2012-08-10 NOTE — Telephone Encounter (Signed)
F/U  Calling back to speak with nurse regarding medication.

## 2012-08-10 NOTE — Telephone Encounter (Signed)
Would avoid omeprazole and Nexium because she is on Plavix.  Will see if we can get her insurance to [facility] Protonix because medically indicated in this case.

## 2012-08-10 NOTE — Telephone Encounter (Signed)
PT INFORMED OF MED COVERAGE/ ORDER COMPLETE, PT WILL CALL IF MED DON'T WORK.

## 2012-08-10 NOTE — Telephone Encounter (Signed)
Pt wanted to let Dr/Nurse know that the heartburn symptoms are coming back, please suggest something else since protonix not covered by insurance, pt has not tried ranitidine or omeprazole, told her both are OTC and that DR/Nurse will be back tomorrow and will advise further, also told her to discuss with  her pharmacist.  Pt agreed to plan.

## 2012-08-10 NOTE — Telephone Encounter (Signed)
Called insurance 1 [phone], insurance denied protonix currently / stating she needs to try Dexilant first, she stated it will not interfere with plavix, please  Advise.

## 2012-08-13 ENCOUNTER — Ambulatory Visit (HOSPITAL_COMMUNITY)
Admission: RE | Admit: 2012-08-13 | Discharge: 2012-08-13 | Disposition: A | Discharge status: Home / Self Care | Payer: Medicare Other | Source: Ambulatory Visit | Attending: Internal Medicine | Admitting: Internal Medicine

## 2012-08-13 DIAGNOSIS — Z1231 Encounter for screening mammogram for malignant neoplasm of breast: Secondary | ICD-10-CM | POA: Insufficient documentation

## 2012-08-24 ENCOUNTER — Encounter: Payer: Medicare Other | Admitting: Internal Medicine

## 2012-08-25 ENCOUNTER — Other Ambulatory Visit: Payer: Medicare Other

## 2012-09-02 ENCOUNTER — Other Ambulatory Visit (INDEPENDENT_AMBULATORY_CARE_PROVIDER_SITE_OTHER): Payer: Medicare Other

## 2012-09-02 DIAGNOSIS — I2510 Atherosclerotic heart disease of native coronary artery without angina pectoris: Secondary | ICD-10-CM

## 2012-09-02 LAB — LIPID PANEL
Cholesterol: 115 mg/dL (ref 0–200)
HDL: 38.1 mg/dL — ABNORMAL LOW (ref >39.00)
Triglycerides: 103 mg/dL (ref 0.0–149.0)
VLDL: 20.6 mg/dL (ref 0.0–40.0)

## 2012-09-02 NOTE — Progress Notes (Signed)
This encounter was created in error - please disregard.

## 2012-09-18 ENCOUNTER — Ambulatory Visit (INDEPENDENT_AMBULATORY_CARE_PROVIDER_SITE_OTHER): Payer: Medicare Other | Admitting: Internal Medicine

## 2012-09-18 ENCOUNTER — Encounter: Payer: Self-pay | Admitting: Internal Medicine

## 2012-09-18 ENCOUNTER — Ambulatory Visit (HOSPITAL_COMMUNITY)
Admission: RE | Admit: 2012-09-18 | Discharge: 2012-09-18 | Disposition: A | Discharge status: Home / Self Care | Payer: Medicare Other | Source: Ambulatory Visit | Attending: Internal Medicine | Admitting: Internal Medicine

## 2012-09-18 VITALS — BP 126/74 | HR 72 | Temp 98.4°F | Ht 65.0 in | Wt 132.9 lb

## 2012-09-18 DIAGNOSIS — K5730 Diverticulosis of large intestine without perforation or abscess without bleeding: Secondary | ICD-10-CM | POA: Diagnosis not present

## 2012-09-18 DIAGNOSIS — K5790 Diverticulosis of intestine, part unspecified, without perforation or abscess without bleeding: Secondary | ICD-10-CM | POA: Insufficient documentation

## 2012-09-18 DIAGNOSIS — J984 Other disorders of lung: Secondary | ICD-10-CM

## 2012-09-18 DIAGNOSIS — R059 Cough, unspecified: Secondary | ICD-10-CM | POA: Diagnosis not present

## 2012-09-18 DIAGNOSIS — I10 Essential (primary) hypertension: Secondary | ICD-10-CM

## 2012-09-18 DIAGNOSIS — K219 Gastro-esophageal reflux disease without esophagitis: Secondary | ICD-10-CM | POA: Diagnosis not present

## 2012-09-18 DIAGNOSIS — E119 Type 2 diabetes mellitus without complications: Secondary | ICD-10-CM

## 2012-09-18 DIAGNOSIS — R05 Cough: Secondary | ICD-10-CM

## 2012-09-18 DIAGNOSIS — E785 Hyperlipidemia, unspecified: Secondary | ICD-10-CM

## 2012-09-18 DIAGNOSIS — I2510 Atherosclerotic heart disease of native coronary artery without angina pectoris: Secondary | ICD-10-CM | POA: Diagnosis not present

## 2012-09-18 DIAGNOSIS — R058 Other specified cough: Secondary | ICD-10-CM | POA: Insufficient documentation

## 2012-09-18 HISTORY — DX: Diverticulosis of intestine, part unspecified, without perforation or abscess without bleeding: K57.90

## 2012-09-18 LAB — BASIC METABOLIC PANEL WITH GFR
BUN: 17 mg/dL (ref 6–23)
CO2: 25 mEq/L (ref 19–32)
Chloride: 103 mEq/L (ref 96–112)
Creat: 1.15 mg/dL — ABNORMAL HIGH (ref 0.50–1.10)
Glucose, Bld: 69 mg/dL — ABNORMAL LOW (ref 70–99)
Potassium: 4.4 mEq/L (ref 3.5–5.3)

## 2012-09-18 LAB — POCT GLYCOSYLATED HEMOGLOBIN (HGB A1C): Hemoglobin A1C: 6.3

## 2012-09-18 MED ORDER — PREDNISONE (PAK) 10 MG PO TABS: 10.0000 mg | ORAL_TABLET | Freq: Every day | ORAL | Status: DC

## 2012-09-18 MED ORDER — GUAIFENESIN-DM 100-10 MG/5ML PO SYRP: 5.0000 mL | ORAL_SOLUTION | Freq: Three times a day (TID) | ORAL | Status: DC | PRN

## 2012-09-18 MED ORDER — AZITHROMYCIN 250 MG PO TABS: ORAL_TABLET | ORAL | Status: AC

## 2012-09-18 NOTE — Assessment & Plan Note (Addendum)
Patient has been sx'matic since 10/21 with productive cough with clear thick to grey sputum, nasal drainage clear, post nasal drainage.  Cough is constant and she has been coughing to the point of being dyspneic.  She denies sick contacts lives at home with her daughter and grandchildren have not been sick.   Concerned for URI or pertussis. Less likely GERD or pneumonia but in Ddx Ordered 2 view CXR Will empirically treat with Zpack, 20 mg x 2 days, 10 mg x 3 days prednisone, Guafenisin-DM tid prm

## 2012-09-18 NOTE — Assessment & Plan Note (Signed)
Lipid Panel     Component Value Date/Time   CHOL 115 09/02/2012 0859   TRIG 103.0 09/02/2012 0859   HDL 38.10* 09/02/2012 0859   CHOLHDL 3 09/02/2012 0859   VLDL 20.6 09/02/2012 0859   LDLCALC 56 09/02/2012 0859

## 2012-09-18 NOTE — Assessment & Plan Note (Signed)
4/13 cath neg for ischemia but submaximal. Dr. Shirlee Latch (cards) ? Microvascular angina.  If patient has CP in the future consider lexiscan

## 2012-09-18 NOTE — Assessment & Plan Note (Signed)
More recent CXR shows resolution no comment of nodule

## 2012-09-18 NOTE — Assessment & Plan Note (Signed)
Controlled 126/74 today

## 2012-09-18 NOTE — Assessment & Plan Note (Signed)
Pt not currently taking PPI advised her to take. Per Dr. Shirlee Latch (cards) she is to stop Nexium and use protonix but Dexlansoprazole is listed.  Patient is unsure name of new PPI

## 2012-09-18 NOTE — Progress Notes (Signed)
Subjective:    [name], female    DOB: 01/24/1934, 78 y.o.   MRN: [id number]  HPI Comments: 78 y.o woman with PMH hypothyroidism (tsh 2.168 02/2012), DM 2 (6.8% HA1C 02/2012 with meter readings ranging 60s-low 100s), history of HLD, history of headaches, history of hypertension (BP 126/74 today), hx GERD, history CAD with 4/13 cath negative for ischemia but submaximal scan (Dr. Alford Highland note said ? Microvascular angina and if more chest pain get Lexiscan), hx allergic rhinitis, systolic heart murmer, bone tumor, h/o pulmonary nodule (more recent CXR does not comment on nodule), hx PMR and GERD (she is not currently taking her PPI).    She presents with complaint of cough since 09/07/12 that does not seem to be improving with cough medicine (?name).  She has also been taking Mucinex since Fri/Sat prior to todays visit w/o relief of symptoms.  She had the onset of what seems like to be pleuritic CP earlier in her illness which is not resolved.  She is still coughing thick clear to white to grey sputum and sometimes feels like the sputum get stuck in her throat and she can't cough it up.  She reports running nose (clear), postnasal drip (she is not currently taking her PPI), itchy eyes and dry eyes (using eye drops), hoarseness.  These symptoms do not feel like previous allergies or sinus problems to the patient.  At times she coughs constant and feels like she cant stop to the point of being dyspneic and gags.  She normally sleeps with 2 pillows which is not new.  She denies fever, chills, sob, sick contacts, sore throat, sinus pressure.  She has been using a humidifier.  She reports a decrease in appetite recently which is starting to improve.  Previous Pfts showed restrictive lung disease and decreased DLCO 03/2012  She has had some hypoglycemic episodes recently shown on her meter mid to later afternoon with readings 60s-70s but she reports decreased appetite with only drinking juices and her  appetite is improving.  She felt mildly sx'matic.    SH: lives with her daughter in [place].  Denies previous smoking history.      Review of Systems  Constitutional: Positive for appetite change. Negative for fever, chills and activity change.  HENT: Positive for congestion, rhinorrhea and postnasal drip.        Wears hearing aids  Eyes: Positive for itching.       Dry eyes   Respiratory: Positive for cough. Negative for shortness of breath.   Cardiovascular: Negative for chest pain.       Objective:   Physical Exam  Nursing note and vitals reviewed. Constitutional: She is oriented to person, place, and time. Vital signs are normal. She appears well-developed and well-nourished. She is cooperative. No distress.       Pleasant   HENT:  Head: Normocephalic and atraumatic.  Nose: No rhinorrhea. Right sinus exhibits no maxillary sinus tenderness and no frontal sinus tenderness. Left sinus exhibits no maxillary sinus tenderness and no frontal sinus tenderness.  Mouth/Throat: Oropharynx is clear and moist and mucous membranes are normal. No oropharyngeal exudate.       Congested turbinates No ethmoid TTP  Eyes: Conjunctivae normal are normal. Pupils are equal, round, and reactive to light. Right eye exhibits no discharge. Left eye exhibits no discharge. No scleral icterus.  Cardiovascular: Normal rate, regular rhythm, S1 normal, S2 normal and normal heart sounds.   No murmur heard. Pulmonary/Chest: Effort normal and breath  sounds normal.       Min. Crackles right lung field   Abdominal: Soft. Bowel sounds are normal. She exhibits no distension. There is no tenderness.  Neurological: She is alert and oriented to person, place, and time.       Gait normal   Skin: Skin is warm, dry and intact. No rash noted.  Psychiatric: She has a normal mood and affect. Her speech is normal and behavior is normal. Judgment normal. Cognition and memory are normal.          Assessment &  Plan:  F/u 1 week prn otherwise 2-3 months

## 2012-09-18 NOTE — Patient Instructions (Addendum)
Pick up your medications from the pharmacy. Return within 1 week if not better.  We will contact you with results from the chest X ray today  Cough, Adult  A cough is a reflex. It helps you clear your throat and airways. A cough can help heal your body. A cough can last 2 or 3 weeks (acute) or may last more than 8 weeks (chronic). Some common causes of a cough can include an infection, allergy, or a cold. HOME CARE  Only take medicine as told by your doctor.  If given, take your medicines (antibiotics) as told. Finish them even if you start to feel better.  Use a cold steam vaporizer or humidier in your home. This can help loosen thick spit (secretions).  Sleep so you are almost sitting up (semi-upright). Use pillows to do this. This helps reduce coughing.  Rest as needed.  Stop smoking if you smoke. GET HELP RIGHT AWAY IF:  You have yellowish-white fluid (pus) in your thick spit.  Your cough gets worse.  Your medicine does not reduce coughing, and you are losing sleep.  You cough up blood.  You have trouble breathing.  Your pain gets worse and medicine does not help.  You have a fever. MAKE SURE YOU:   Understand these instructions.  Will watch your condition.  Will get help right away if you are not doing well or get worse. Document Released: 07/18/2011 Document Revised: 01/27/2012 Document Reviewed: 07/18/2011 [facility] Patient Information 2013 [place], [place].

## 2012-09-18 NOTE — Assessment & Plan Note (Addendum)
Meter readings reviewed ranging 60s-low 100s. Low readings likely due to low appetite which is improving Continue current therapy  HA1C 02/2012 6.8% controlled, fsbs 112 today  Up to date of foot exam and eye exam due 2014

## 2012-09-21 ENCOUNTER — Encounter: Payer: Self-pay | Admitting: Internal Medicine

## 2012-09-21 DIAGNOSIS — N189 Chronic kidney disease, unspecified: Secondary | ICD-10-CM

## 2012-09-21 DIAGNOSIS — N179 Acute kidney failure, unspecified: Secondary | ICD-10-CM | POA: Insufficient documentation

## 2012-09-21 NOTE — Progress Notes (Signed)
I saw, examined, and discussed the patient with Dr McLean and agree with the note contained here.  

## 2012-09-23 ENCOUNTER — Telehealth: Payer: Self-pay | Admitting: *Deleted

## 2012-09-23 NOTE — Telephone Encounter (Signed)
Pt calls and states her cough is no better from 11/1 visit, denies fever, shortness of breath and chest pain. Able to eat and drink. 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

## 2012-09-23 NOTE — Telephone Encounter (Signed)
Ok

## 2012-09-24 ENCOUNTER — Ambulatory Visit (INDEPENDENT_AMBULATORY_CARE_PROVIDER_SITE_OTHER): Payer: Medicare Other | Admitting: Internal Medicine

## 2012-09-24 VITALS — BP 148/65 | HR 66 | Temp 96.4°F | Ht 65.0 in | Wt 134.6 lb

## 2012-09-24 DIAGNOSIS — R059 Cough, unspecified: Secondary | ICD-10-CM | POA: Diagnosis not present

## 2012-09-24 DIAGNOSIS — R05 Cough: Secondary | ICD-10-CM

## 2012-09-24 MED ORDER — PREDNISONE 50 MG PO TABS: ORAL_TABLET | ORAL | Status: DC

## 2012-09-24 MED ORDER — CODEINE-BROMPHENIRAMINE 10-2 MG/5ML PO SYRP: 5.0000 mL | ORAL_SOLUTION | Freq: Four times a day (QID) | ORAL | Status: DC | PRN

## 2012-09-24 MED ORDER — ONDANSETRON HCL 4 MG PO TABS: 4.0000 mg | ORAL_TABLET | Freq: Every day | ORAL | Status: DC | PRN

## 2012-09-24 NOTE — Progress Notes (Signed)
  Subjective:    [name], female    DOB: 01/24/1934, 78 y.o.   MRN: [id number]  HPI  78 y.o woman with PMH hypothyroidism (tsh 2.168 02/2012), DM 2 (6.8% HA1C 02/2012), HLD, hypertension, GERD, CAD with 4/13 cath negative for ischemia but submaximal scan (Dr. Alford Highland note said ? Microvascular angina and if more chest pain get Lexiscan), allergic rhinitis, systolic heart murmer and GERD.   She presents with complaint of cough since 09/07/12. She was seen in clinic on Nov 1st and was prescribed antibiotics(zpack), steroids and cough suppressants. She denies relief of symptoms. She is still coughing thick clear to white to grey sputum and sometimes feels like the sputum get stuck in her throat and she can't cough it up. She reports running nose, postnasal drip, hoarseness. These symptoms do not feel like previous allergies or sinus problems to the patient. At times she coughs constant and feels like she cant stop to the point of being dyspneic and gags. She normally sleeps with 2 pillows which is not new. She denies fever, chills, sob, sick contacts, sore throat, sinus pressure. She has been using a humidifier. Previous Pfts showed obstructive lung disease and decreased DLCO 03/2012   Review of Systems  Constitutional: Negative for fever, activity change, appetite change and unexpected weight change.  HENT: Positive for sore throat, voice change and postnasal drip. Negative for trouble swallowing.   Eyes: Negative for pain, discharge and itching.  Respiratory: Positive for cough and choking. Negative for shortness of breath and wheezing.   Cardiovascular: Negative for chest pain and leg swelling.  Gastrointestinal: Negative for nausea, abdominal pain, diarrhea, constipation and abdominal distention.  Genitourinary: Negative for frequency, hematuria and difficulty urinating.  Neurological: Negative for dizziness and headaches.  Psychiatric/Behavioral: Negative for suicidal ideas and  behavioral problems.       Objective:   Physical Exam  Constitutional: She is oriented to person, place, and time. She appears well-developed and well-nourished.  HENT:  Head: Normocephalic and atraumatic.  Eyes: Conjunctivae normal and EOM are normal. Pupils are equal, round, and reactive to light. No scleral icterus.  Neck: Normal range of motion. Neck supple. No JVD present. No thyromegaly present.  Cardiovascular: Normal rate, regular rhythm and intact distal pulses.  Exam reveals no gallop and no friction rub.   Murmur (II/VI systeolic flow murmur best heard at left sternal border) heard. Pulmonary/Chest: Effort normal and breath sounds normal. No respiratory distress. She has no wheezes. She has no rales.  Abdominal: Soft. Bowel sounds are normal. She exhibits no distension and no mass. There is no tenderness. There is no rebound and no guarding.  Musculoskeletal: Normal range of motion. She exhibits no edema and no tenderness.  Lymphadenopathy:    She has no cervical adenopathy.  Neurological: She is alert and oriented to person, place, and time.  Psychiatric: She has a normal mood and affect. Her behavior is normal.          Assessment & Plan:

## 2012-09-24 NOTE — Patient Instructions (Addendum)
Ms. Pounds For your cough, we have prescribed 50mg  of prednisone to be taken once daily over 5 days. This will serve to reduce inflammation in your airways. We have also prescribed a cough suppressant containing codeine so that you will not feel like you have to cough as much. We also recommend steam inhalations and warm water gargles 2-3x per day. Follow up with [place] as needed if your symptoms persist.

## 2012-09-24 NOTE — Progress Notes (Signed)
Subjective Christine [name] is a 78 year old lady with a history of hypothyroidism, DM type 2, HLD, HA, HTN, GERD, CAD, and allergic rhinitis here at the clinic for a persistent cough since 09/07/2012. She has no clear triggers. Her cough is not improving. It began 3 weeks ago. She took the full course of medications prescribed at last visit. Her cough is very bothersome, intermittent, and uncontrollable. She produces clear mucous, recently foamy. It is difficult to produce, but her symptoms are alleviated when she gets it out. She denies headaches, fevers or chills. She has had a runny nose with clear output. No blood produced in cough or nasal excretion. No sore throat, cugh doesn't bother her if she's sleeping at night, and is a little better in the AM. No nausea/vomiting, constipation/diarrhea, dysuria or polyuria. She has had no improvement since her last visit. She denies fever, headaches, SOB, wheezes, and chest pain. No difficulty eating. No new rashes. No smoking history, no illicit drug use.   Objective Vitals- Significant for BP 148/65 Physical Exam Gen- NAD, coughing persistently throughout exam HEENT- no lad, clear oropharynx, PERRL, EOMI, clear conjunctiva Neuro- CN II-XII intact, strength 5/5 throughout, sensation intact throughout Pulm- CTAB, coughs on deep inspiration Cardio- RRR, no mrg Psych- pleasant, good eye contact, well kept Skin- slight darkening of skin under eyes and upper nasal bridge, sparing nasolabial folds and lower nose.  Assessment/Plan **Cough Christine [name] has had a cough for about 3 weeks now. She has had a negative chest x-ray, no fevers or chills, no lymphadenopathy, and no tonsillar exudates. We suspect that the cause of her cough may be inflammatory. Her last note states that she has not been taking her PPI, which suggests that GERD may be contributing to her current symptoms. -Codeine cough suppressant -60 mg prednisone x 5 days -Steam inhalations + warm salt water  gargles  **HTN This may be 2/2 to stress from her current illness. We will not change her current regimen today.  **DM A1c 6.3 at last check (09/18/2012). -Repeat capillary blood glucose today

## 2012-09-24 NOTE — Assessment & Plan Note (Signed)
Persistent cough since last 2+ weeks now. Presumptive treatment fr pertusis completed. No relief with cough suppressants OTC. Steroid taper prescribed but suboptimal dose(20 mg day 1 followed by 10 mg for 3 days) Upper airway hyperreactivity syndrome post viral Vs vocal cord dysfunction Vs bronchitis vs GERD(recent d/c of PPI)  Plan: 50 mg prednisone for 5 days and then stop(no need to taper for short course) Codeine containing cough suppressant with zofran for a/w nausea and vomiting Steam inhalations and gargles Follow up as needed Restart PPI as soon as possible Follow up as needed

## 2012-09-25 ENCOUNTER — Telehealth: Payer: Self-pay | Admitting: *Deleted

## 2012-09-25 MED ORDER — GUAIFENESIN-CODEINE 100-10 MG/5ML PO SYRP: 5.0000 mL | ORAL_SOLUTION | Freq: Three times a day (TID) | ORAL | Status: DC | PRN

## 2012-09-25 NOTE — Telephone Encounter (Signed)
Pharm and pt called to say the cod-brom syrup does not exist, please prescribe something else

## 2012-09-25 NOTE — Telephone Encounter (Signed)
I Rx Cheratussin. Her allergies include codeine but the team who saw her Rx'd it. Pls verify codeine is OK.

## 2012-09-28 ENCOUNTER — Encounter: Payer: Self-pay | Admitting: Internal Medicine

## 2012-09-28 ENCOUNTER — Other Ambulatory Visit: Payer: Self-pay | Admitting: Internal Medicine

## 2012-09-28 MED ORDER — GUAIFENESIN-CODEINE 100-10 MG/5ML PO SYRP: 5.0000 mL | ORAL_SOLUTION | Freq: Four times a day (QID) | ORAL | Status: DC | PRN

## 2012-09-28 NOTE — Telephone Encounter (Signed)
Patient has nausea after codeine and was prescribed Zofran if she experiences that. I will change the prescription to something else containing codeine (she needs stronger cough suppressant).

## 2012-10-05 ENCOUNTER — Telehealth: Payer: Self-pay | Admitting: Cardiology

## 2012-10-05 MED ORDER — PANTOPRAZOLE SODIUM 40 MG PO TBEC: 40.0000 mg | DELAYED_RELEASE_TABLET | Freq: Every day | ORAL | Status: DC

## 2012-10-05 NOTE — Telephone Encounter (Signed)
Pt started Dexilant about a week ago because of continued cough. She had not started Dexilant before then because she had been feeling OK.  Pt states the Dexilant has helped her cough but she feels it is making her dizzy. She associates the dizziness with starting Dexilant. Reviewed with Dr Tawanna Solo Dexilant and start protonix 40mg  daily. I spoke with Danelle Earthly at [employer] and he approved protonix.

## 2012-10-05 NOTE — Telephone Encounter (Signed)
Case # [id number].

## 2012-10-05 NOTE — Telephone Encounter (Signed)
Insurance phone # [phone]. Pt advised to stop dexilant -pt states this causes dizziness- and start protonix 40mg  daily.

## 2012-10-05 NOTE — Telephone Encounter (Signed)
Spoke with pt

## 2012-10-05 NOTE — Telephone Encounter (Signed)
protonix 40mg  daily approved by Danelle Earthly at [phone], case #  [id number].

## 2012-10-05 NOTE — Telephone Encounter (Signed)
New problem:   Need to discuss medication. Ask was she out of refill patient stated no. Did not wasn't to disclose any more information.

## 2012-10-30 ENCOUNTER — Other Ambulatory Visit: Payer: Self-pay | Admitting: Internal Medicine

## 2012-11-09 ENCOUNTER — Other Ambulatory Visit: Payer: Self-pay | Admitting: *Deleted

## 2012-11-09 MED ORDER — GLIMEPIRIDE 1 MG PO TABS: 1.0000 mg | ORAL_TABLET | Freq: Every day | ORAL | Status: DC

## 2012-11-13 ENCOUNTER — Encounter (INDEPENDENT_AMBULATORY_CARE_PROVIDER_SITE_OTHER): Payer: Medicaid Other

## 2012-11-13 DIAGNOSIS — I6529 Occlusion and stenosis of unspecified carotid artery: Secondary | ICD-10-CM

## 2012-12-01 ENCOUNTER — Telehealth: Payer: Self-pay | Admitting: Cardiology

## 2012-12-01 NOTE — Telephone Encounter (Signed)
Pt calling re medication °

## 2012-12-01 NOTE — Telephone Encounter (Signed)
Pt states she has diarrhea and headache for about 1 month. Since protonix is the newest medication, pt wants to know if Dr Shirlee Latch thinks these symptoms are due to protonix. I will forward to Dr Shirlee Latch for review.

## 2012-12-01 NOTE — Telephone Encounter (Signed)
Probably not but she can try holding it for a week to see if it makes any difference if she wants.

## 2012-12-01 NOTE — Telephone Encounter (Signed)
Pt advised,verbalized understanding. 

## 2012-12-08 DIAGNOSIS — Z23 Encounter for immunization: Secondary | ICD-10-CM | POA: Diagnosis not present

## 2012-12-11 ENCOUNTER — Telehealth: Payer: Self-pay | Admitting: Cardiology

## 2012-12-11 NOTE — Telephone Encounter (Signed)
**Note De-Identified  Obfuscation** Pt states that she stopped taking Protonix for 1 week and feels better. She states she did take a tablet this morning and so far is not having any problems. She wants to take Protonix every other day to see if she can tolerate. Pt is advised that she may take every other day and to call office to report any side effects, she verbalized understanding.

## 2012-12-11 NOTE — Telephone Encounter (Signed)
New problem:   Was told to call back to speak with nurse regarding medication.

## 2012-12-14 ENCOUNTER — Other Ambulatory Visit: Payer: Self-pay | Admitting: *Deleted

## 2012-12-14 MED ORDER — LEVOTHYROXINE SODIUM 25 MCG PO TABS: 25.0000 ug | ORAL_TABLET | Freq: Every day | ORAL | Status: DC

## 2013-01-11 ENCOUNTER — Ambulatory Visit (INDEPENDENT_AMBULATORY_CARE_PROVIDER_SITE_OTHER): Payer: Medicare Other | Admitting: Internal Medicine

## 2013-01-11 ENCOUNTER — Encounter: Payer: Self-pay | Admitting: Internal Medicine

## 2013-01-11 ENCOUNTER — Other Ambulatory Visit: Payer: Self-pay | Admitting: Internal Medicine

## 2013-01-11 VITALS — BP 148/74 | HR 70 | Temp 97.7°F | Ht 65.0 in | Wt 136.5 lb

## 2013-01-11 DIAGNOSIS — M353 Polymyalgia rheumatica: Secondary | ICD-10-CM

## 2013-01-11 DIAGNOSIS — M8580 Other specified disorders of bone density and structure, unspecified site: Secondary | ICD-10-CM | POA: Insufficient documentation

## 2013-01-11 DIAGNOSIS — N183 Chronic kidney disease, stage 3 unspecified: Secondary | ICD-10-CM | POA: Diagnosis not present

## 2013-01-11 DIAGNOSIS — I2510 Atherosclerotic heart disease of native coronary artery without angina pectoris: Secondary | ICD-10-CM | POA: Diagnosis not present

## 2013-01-11 DIAGNOSIS — K219 Gastro-esophageal reflux disease without esophagitis: Secondary | ICD-10-CM | POA: Diagnosis not present

## 2013-01-11 DIAGNOSIS — Z9189 Other specified personal risk factors, not elsewhere classified: Secondary | ICD-10-CM | POA: Insufficient documentation

## 2013-01-11 DIAGNOSIS — M069 Rheumatoid arthritis, unspecified: Secondary | ICD-10-CM

## 2013-01-11 DIAGNOSIS — E119 Type 2 diabetes mellitus without complications: Secondary | ICD-10-CM

## 2013-01-11 DIAGNOSIS — I129 Hypertensive chronic kidney disease with stage 1 through stage 4 chronic kidney disease, or unspecified chronic kidney disease: Secondary | ICD-10-CM

## 2013-01-11 DIAGNOSIS — E039 Hypothyroidism, unspecified: Secondary | ICD-10-CM | POA: Diagnosis not present

## 2013-01-11 DIAGNOSIS — M899 Disorder of bone, unspecified: Secondary | ICD-10-CM

## 2013-01-11 DIAGNOSIS — E785 Hyperlipidemia, unspecified: Secondary | ICD-10-CM | POA: Diagnosis not present

## 2013-01-11 DIAGNOSIS — G43909 Migraine, unspecified, not intractable, without status migrainosus: Secondary | ICD-10-CM

## 2013-01-11 DIAGNOSIS — M949 Disorder of cartilage, unspecified: Secondary | ICD-10-CM

## 2013-01-11 DIAGNOSIS — Z0000 Encounter for general adult medical examination without abnormal findings: Secondary | ICD-10-CM | POA: Insufficient documentation

## 2013-01-11 DIAGNOSIS — R059 Cough, unspecified: Secondary | ICD-10-CM

## 2013-01-11 DIAGNOSIS — Z79899 Other long term (current) drug therapy: Secondary | ICD-10-CM

## 2013-01-11 DIAGNOSIS — I10 Essential (primary) hypertension: Secondary | ICD-10-CM

## 2013-01-11 DIAGNOSIS — D492 Neoplasm of unspecified behavior of bone, soft tissue, and skin: Secondary | ICD-10-CM

## 2013-01-11 DIAGNOSIS — R05 Cough: Secondary | ICD-10-CM

## 2013-01-11 LAB — BASIC METABOLIC PANEL WITH GFR
CO2: 28 mEq/L (ref 19–32)
Calcium: 9.6 mg/dL (ref 8.4–10.5)
Chloride: 102 mEq/L (ref 96–112)
Creat: 0.98 mg/dL (ref 0.50–1.10)
GFR, Est Non African American: 56 mL/min — ABNORMAL LOW
Sodium: 138 mEq/L (ref 135–145)

## 2013-01-11 LAB — POCT GLYCOSYLATED HEMOGLOBIN (HGB A1C): Hemoglobin A1C: 6.3

## 2013-01-11 LAB — GLUCOSE, CAPILLARY: Glucose-Capillary: 138 mg/dL — ABNORMAL HIGH (ref 70–99)

## 2013-01-11 MED ORDER — RANITIDINE HCL 150 MG PO TABS: 150.0000 mg | ORAL_TABLET | Freq: Two times a day (BID) | ORAL | Status: DC

## 2013-01-11 MED ORDER — IMIPRAMINE HCL 50 MG PO TABS: 50.0000 mg | ORAL_TABLET | Freq: Two times a day (BID) | ORAL | Status: DC

## 2013-01-11 NOTE — Assessment & Plan Note (Addendum)
Last BUN/Cr elevated from baseline, will recheck today.    UPDATE: BUN/Cr 16/0.98, at baseline, GFR 56

## 2013-01-11 NOTE — Assessment & Plan Note (Addendum)
Christine [name] reports that she was previously treated with prednisone and her symptoms of PMR improved.  She currently does not have any symptoms except whitening of the skin which could be related to Raynaud's phenomenon.  She is not on chronic steroids. Through chart review I did not find that she has seen a rheumatologist previously and no clinic notes from here note this diagnosis.  More research into her presenting symptoms will need to be done.   UPDATE: ESR normal at 10

## 2013-01-11 NOTE — Assessment & Plan Note (Signed)
LDL 56, which is well controlled.   Continue atorvastatin.

## 2013-01-11 NOTE — Progress Notes (Signed)
Subjective:    [name], female    DOB: 01/24/1934, 79 y.o.   MRN: [id number]  CC: Routine follow up  HPI  Christine [name] is a 79yo woman who presents for routine follow up of her chronic health issues.   For her DM, she had 2 logged as below 70.  On days when her morning sugar is lower, she will increase what she eats.  But sometimes she still has low blood sugars prior to lunch.    She has been on protonix which regularly gave her a headache and also caused diarrhea.  Due to these symptoms her appetite had decreased.  She reports this as the likely cause for her low blood sugars.  She called her cardiologist who advised her to take the medication only every other day.  Now she is taking it every 3-4 days.  If she doesn't take it, she has reflux symptoms of food coming up in her throat and causes her to cough.    One episode of shortness of breath while going up the steps. Her daughter has a broken foot and she has been having to go up the steps more often.  Once in a while chest pain that is not that bad, maybe once a month.  Doesn't last long, goes away on its own.  She sometimes hears her blood pressure in her ears, but it goes away on its own.    She takes occasional prednisone for PMR/temporal arteritis.  She recently had a course of steroids for bronchitis.  She reports during the cold weather that she has a whitening discoloration of the skin of her fingers which she calls "poor circulation" which improves with warming of her hands.    Cough is imprioved since restarting protonix, though she is only taking it occasionally.    She is not smoking.   Her medications were updated and confirmed in epic.  They include amaryl, valsartan, metoprolol, atorvastatin, ventolin, advair, imipramine, plavix, ismo, levothyroxine, loratadine, zofran prn, protonix, vitamin C   Review of Systems  Constitutional: Positive for activity change. Negative for fever, chills and unexpected weight  change.  HENT: Positive for nosebleeds. Negative for sore throat, rhinorrhea, trouble swallowing and postnasal drip.        Nosebleed off an on for a couple of weeks during the winter.   Eyes: Negative for redness and visual disturbance.  Respiratory: Positive for shortness of breath. Negative for cough and chest tightness.   Cardiovascular: Positive for chest pain. Negative for palpitations and leg swelling.  Gastrointestinal: Negative for diarrhea, constipation, blood in stool and abdominal distention.  Endocrine: Negative for polydipsia, polyphagia and polyuria.  Genitourinary: Negative for dysuria, frequency and difficulty urinating.  Musculoskeletal: Positive for arthralgias. Negative for back pain and gait problem.  Skin: Negative for color change, pallor and rash.  Neurological: Positive for headaches. Negative for dizziness and syncope.       Mild, in forehead, improved.   Psychiatric/Behavioral: Negative for confusion and decreased concentration.       Objective:   Physical Exam  Constitutional: She is oriented to person, place, and time. She appears well-developed and well-nourished.  Wearing a wig  HENT:  Head: Normocephalic and atraumatic.  Eyes: Conjunctivae and EOM are normal. Pupils are equal, round, and reactive to light. No scleral icterus.  Neck: Neck supple.  Cardiovascular: Normal rate, regular rhythm and normal heart sounds.   No murmur heard. Pulmonary/Chest: Effort normal. She has no decreased breath sounds.  She has no wheezes. She has no rhonchi. She has rales in the right lower field and the left lower field.  Abdominal: Soft. Bowel sounds are normal. There is no tenderness.  Musculoskeletal: She exhibits no edema and no tenderness.  No tenderness to temporal arteries; no noticeable discoloration to hands in office.   Neurological: She is alert and oriented to person, place, and time.  Skin: Skin is warm and dry. No erythema.  Psychiatric: She has a normal  mood and affect. Her behavior is normal.   A1C: 6.3  Pending BMP and ESR     Assessment & Plan:  RTC in 3 months

## 2013-01-11 NOTE — Assessment & Plan Note (Signed)
DEXA ordered today Tetanus 11/10 Pneumovax 10/12 Mammogram: 07/2011, normal Colonoscopy 11/09, normal

## 2013-01-11 NOTE — Patient Instructions (Addendum)
Please schedule a follow up visit within the next 3 months.   For your medications:   Please bring all of your pill  Bottles with you to each visit.  This will help make sure that we have an up to date list of all the medications you are taking.  Please also bring any over the counter herbal medications you are taking (not including advil, tylenol, etc)  Please stop taking protonix.  Please start taking Ranitidine instead, up to two times daily for your heartburn/reflux.    You will have a Bone Density scan scheduled, please keep this appointment.   For your Diabetes:   Please continue to check your blood sugar as you have been.   Please keep a record of your blood sugars, either in your glucometer or on paper.  Please bring your glucometer to every clinic visit.   Please check your feet at least once weekly to see if you are developing calluses or wounds.   Continue to follow a good diet, including limiting refined sugars and carbohydrates and exercising at least 3 times per week.    Please schedule an eye exam within the next year.   Thank you!

## 2013-01-11 NOTE — Assessment & Plan Note (Signed)
Discontinue protonix due to intolerable side effects.   Start Ranitidine and evaluate for symptom control

## 2013-01-11 NOTE — Assessment & Plan Note (Signed)
TSH around 2 in 02/2012  Check yearly; continue synthroid

## 2013-01-11 NOTE — Assessment & Plan Note (Addendum)
2 episodes of hypoglycemia due to decreased appetite  A1C 6.3 BP 148/74 Foot: exam 02/2012, normal Eye: 05/2012 - Grade II htn retinopathy, no diabetic retinopathy Renal: MAU/Cr 7.6 in 02/2012: UPDATE: New MAU/Cr 9.5 LDL 56  Continue glimepiride.  If at next visit she continues to have low sugars, will consider decreasing this medication.

## 2013-01-11 NOTE — Assessment & Plan Note (Addendum)
Occasional chest pain, only about once a month.  She reports being able to climb the stairs with only mild SOB one time.  She is taking her statin, beta blocker, plavix and ISMO without issue.  LDL is well controlled.

## 2013-01-11 NOTE — Assessment & Plan Note (Signed)
BP well controlled for a 78yo woman at 148/74.   Continue valsartan, metoprolol, ismo

## 2013-01-11 NOTE — Assessment & Plan Note (Signed)
Will order DEXA scan for evaluation

## 2013-01-11 NOTE — Assessment & Plan Note (Signed)
Improved on PPI, though we will be changing this to an H2 blocker today

## 2013-01-12 LAB — MICROALBUMIN / CREATININE URINE RATIO: Microalb Creat Ratio: 9.5 mg/g (ref 0.0–30.0)

## 2013-01-12 LAB — SEDIMENTATION RATE: Sed Rate: 10 mm/hr (ref 0–22)

## 2013-01-14 ENCOUNTER — Ambulatory Visit (HOSPITAL_COMMUNITY)
Admission: RE | Admit: 2013-01-14 | Discharge: 2013-01-14 | Disposition: A | Discharge status: Home / Self Care | Payer: Medicare Other | Source: Ambulatory Visit | Attending: Internal Medicine | Admitting: Internal Medicine

## 2013-01-14 DIAGNOSIS — Z9189 Other specified personal risk factors, not elsewhere classified: Secondary | ICD-10-CM

## 2013-01-14 DIAGNOSIS — Z13820 Encounter for screening for osteoporosis: Secondary | ICD-10-CM | POA: Diagnosis not present

## 2013-01-14 DIAGNOSIS — Z780 Asymptomatic menopausal state: Secondary | ICD-10-CM | POA: Insufficient documentation

## 2013-01-14 DIAGNOSIS — M949 Disorder of cartilage, unspecified: Secondary | ICD-10-CM | POA: Insufficient documentation

## 2013-01-14 DIAGNOSIS — M899 Disorder of bone, unspecified: Secondary | ICD-10-CM | POA: Insufficient documentation

## 2013-01-14 DIAGNOSIS — Z0000 Encounter for general adult medical examination without abnormal findings: Secondary | ICD-10-CM

## 2013-01-19 ENCOUNTER — Encounter: Payer: Self-pay | Admitting: Internal Medicine

## 2013-01-27 ENCOUNTER — Encounter: Payer: Self-pay | Admitting: *Deleted

## 2013-01-28 ENCOUNTER — Other Ambulatory Visit: Payer: Self-pay | Admitting: *Deleted

## 2013-01-28 ENCOUNTER — Other Ambulatory Visit: Payer: Self-pay | Admitting: Internal Medicine

## 2013-01-28 DIAGNOSIS — E785 Hyperlipidemia, unspecified: Secondary | ICD-10-CM

## 2013-01-28 DIAGNOSIS — I2510 Atherosclerotic heart disease of native coronary artery without angina pectoris: Secondary | ICD-10-CM

## 2013-01-28 MED ORDER — ISOSORBIDE MONONITRATE ER 30 MG PO TB24: 90.0000 mg | ORAL_TABLET | Freq: Every day | ORAL | Status: DC

## 2013-01-29 ENCOUNTER — Ambulatory Visit (INDEPENDENT_AMBULATORY_CARE_PROVIDER_SITE_OTHER): Payer: Medicare Other | Admitting: Cardiology

## 2013-01-29 ENCOUNTER — Encounter: Payer: Self-pay | Admitting: Cardiology

## 2013-01-29 ENCOUNTER — Other Ambulatory Visit: Payer: Self-pay | Admitting: *Deleted

## 2013-01-29 VITALS — BP 120/60 | HR 57 | Ht 65.0 in | Wt 135.0 lb

## 2013-01-29 DIAGNOSIS — E785 Hyperlipidemia, unspecified: Secondary | ICD-10-CM | POA: Diagnosis not present

## 2013-01-29 DIAGNOSIS — R079 Chest pain, unspecified: Secondary | ICD-10-CM

## 2013-01-29 DIAGNOSIS — I2510 Atherosclerotic heart disease of native coronary artery without angina pectoris: Secondary | ICD-10-CM | POA: Diagnosis not present

## 2013-01-29 MED ORDER — ISOSORBIDE MONONITRATE ER 60 MG PO TB24: ORAL_TABLET | ORAL | Status: DC

## 2013-01-29 NOTE — Patient Instructions (Addendum)
Increase Imdur(isosorbide) to 120mg  daily. This will be 2 of your 60mg  tablets daily at the same time.   Your physician has requested that you have en exercise stress myoview. For further information please visit [email or web address] Please follow instruction sheet, as given.  Your physician recommends that you schedule a follow-up appointment in: 2 weeks with Brynda Rim  Your physician wants you to follow-up in: 6 months with Dr Shirlee Latch. (September 2014). You will receive a reminder letter in the mail two months in advance. If you don't receive a letter, please call our office to schedule the follow-up appointment.

## 2013-01-29 NOTE — Progress Notes (Signed)
[name], female   DOB: 01/24/1934, 79 y.o.   MRN: [id number] PCP: Dr. Criselda Peaches  79 yo with history of CAD, HTN, and diabetes presents for cardiology followup.  I initially saw Christine [name] in the hospital in 10/12 with chest pain.  Left heart cath showed a 40-50% distal left main stenosis that was not thought to be flow-limiting.  She additionally had a 75% stenosis in a diagonal.  Chest pain resolved with medical therapy.  She was admitted again in 4/13 with chest pain and exertional dyspnea.  Cardiac enzymes were negative.  LHC looked similar to prior with 50% distal LM, 70% ostial D1, and 70% ostial ramus.  Echo showed preserved EF.  A dobutamine stress echo was done, this was submaximal but did not show evidence for ischemia.  No intervention, planned medical management.   Anti-anginal meds were titrated up and she was discharged home.  After that admission, she did well for a number of months with only rare, nonexertional CP and no NTG use.   Over the last week, she notes increased chest pain episodes.  She is having central chest aching that is mild and nonexertional.  It will happen 2-3 times a day and only last for about 1 minute.  She has not used NTG.  Of note, she does have a history of GERD.  She also notes that she is getting fatigued more easily over the last week.  She is short of breath after walking up a flight of steps or up an incline.    ECG: NSR, iRBBB, ? Old ASMI  Labs (2/13): K 3.3, creatinine 0.9 Labs (3/13): HDL 42, LDL 49 Labs (4/13): K 4.1, creatinine 1.01 Labs (10/13): LDL 56, HDL 38 Labs (2/14): K 3.8, creatinine 0.98  PMH: 1. CAD: Chest pain 10/12.  LHC with 40-50% distal LM stenosis, 70-75% ostial D1.  No flow-limiting lesions, medically managed.  Echo (10/12): EF 60-65%, mild LV hypertrophy, PA systolic pressure 24 mmHg.  Chest pain 4/13.  LHC with 50% distal LM, 70% ostial D1, 50% proximal LAD, 70% proximal ramus.  No flow-limiting lesions, medically  managed.  Echo (4/13) with EF 60-65%, mild LVH.  DSE (4/13) with no ischemia but it was a submaximal study.  2. DM2 3. HTN 4. Hyperlipidemia 5. CKD 6. Carotid dopplers (10/12): 0-39% bilateral carotid stenosis.  7. Allergy to aspirin => rash.  8. GERD 9. Migraines 10. Hypothyroidism 11. COPD 12. PMR  SH: Nonsmoker.  Lives in [place].   FH: CAD  ROS: All systems reviewed and negative except as per HPI.   Current Outpatient Prescriptions  Medication Sig Dispense Refill  . acetaminophen (TYLENOL) 500 MG tablet Take 1,000 mg by mouth every 6 (six) hours as needed. For pain      . ADVAIR DISKUS 250-50 MCG/DOSE AEPB inhale 1 dose by mouth every 12 hours  60 each  2  . atorvastatin (LIPITOR) 40 MG tablet Take 1 tablet (40 mg total) by mouth daily.  90 tablet  3  . clopidogrel (PLAVIX) 75 MG tablet take 1 tablet by mouth once daily  30 tablet  5  . glimepiride (AMARYL) 1 MG tablet Take 1 tablet (1 mg total) by mouth daily before breakfast.  30 tablet  3  . glucose blood (ONE TOUCH ULTRA TEST) test strip Use as instructed  100 each  12  . guaiFENesin-codeine (ROBITUSSIN AC) 100-10 MG/5ML syrup Take 5 mLs by mouth 4 (four) times daily as needed for cough.  120 mL  1  . guaiFENesin-dextromethorphan (ROBITUSSIN DM) 100-10 MG/5ML syrup Take 5 mLs by mouth 3 (three) times daily as needed for cough.  118 mL  0  . imipramine (TOFRANIL) 50 MG tablet Take 1 tablet (50 mg total) by mouth 2 (two) times daily.  60 tablet  3  . levothyroxine (SYNTHROID, LEVOTHROID) 25 MCG tablet Take 1 tablet (25 mcg total) by mouth daily.  90 tablet  1  . loratadine (CLARITIN) 10 MG tablet Take 10 mg by mouth daily as needed. For allergies      . metoprolol succinate (TOPROL-XL) 100 MG 24 hr tablet Take 1 tablet (100 mg total) by mouth daily. Take with or immediately following a meal.  30 tablet  11  . Multiple Vitamin (MULITIVITAMIN WITH MINERALS) TABS Take 1 tablet by mouth daily.      . nitroGLYCERIN (NITROSTAT)  0.4 MG SL tablet Place 0.4 mg under the tongue every 5 (five) minutes as needed. For chest pain      . ondansetron (ZOFRAN) 4 MG tablet Take 1 tablet (4 mg total) by mouth daily as needed for nausea.  30 tablet  1  . ranitidine (ZANTAC) 150 MG tablet Take 1 tablet (150 mg total) by mouth 2 (two) times daily.  60 tablet  1  . valsartan (DIOVAN) 160 MG tablet Take 160 mg by mouth daily.      . VENTOLIN HFA 108 (90 BASE) MCG/ACT inhaler inhale 2 puffs by mouth every 4 to 6 hours if needed for shortness of breath  18 g  3  . vitamin C (ASCORBIC ACID) 500 MG tablet Take 500 mg by mouth daily.      . isosorbide mononitrate (IMDUR) 60 MG 24 hr tablet 2 tablets (total 120mg ) daily  60 tablet  3   No current facility-administered medications for this visit.   BP 120/60  Pulse 57  Ht 5\' 5"  (1.651 m)  Wt 135 lb (61.236 kg)  BMI 22.47 kg/m2  LMP 02/10/1966 General: NAD Neck: No JVD, no thyromegaly or thyroid nodule.  Lungs: Clear to auscultation bilaterally with normal respiratory effort. CV: Nondisplaced PMI.  Heart regular S1/S2, no S3/S4, 2/6 early SEM.  No peripheral edema.  No carotid bruit.  1+ PT bilaterally.   Abdomen: Soft, nontender, no hepatosplenomegaly, no distention.  Neurologic: Alert and oriented x 3.  Psych: Normal affect. Extremities: No clubbing or cyanosis.   Assessment/Plan: 1. CAD: Non-flow limiting, moderate disease on cath in 4/13. DSE was negative for ischemia but it was a submaximal study. Imdur and Toprol were titrated up and patient reported considerably improved chest pain and dyspnea.  However, over the last week, she has started having more episodes of atypical chest pain as well as increased exertional dyspnea.   - Continue Plavix (allergic to aspirin), Toprol XL, valsartan, and atorvastatin. - Increase Imdur to 120 mg daily. - I will get an ETT-Sestamibi to assess for ischemia.  - Followup with PA in 2 weeks.  2. Hyperlipidemia: Continue statin with known CAD.   Will repeat fasting lipids in 6 months.   Marca Ancona 01/29/2013 10:19 AM

## 2013-02-03 ENCOUNTER — Ambulatory Visit (HOSPITAL_COMMUNITY): Payer: Medicare Other | Attending: Cardiology | Admitting: Radiology

## 2013-02-03 VITALS — BP 197/76 | HR 44 | Ht 65.0 in | Wt 133.0 lb

## 2013-02-03 DIAGNOSIS — I10 Essential (primary) hypertension: Secondary | ICD-10-CM | POA: Diagnosis not present

## 2013-02-03 DIAGNOSIS — I4510 Unspecified right bundle-branch block: Secondary | ICD-10-CM | POA: Diagnosis not present

## 2013-02-03 DIAGNOSIS — Z79899 Other long term (current) drug therapy: Secondary | ICD-10-CM | POA: Insufficient documentation

## 2013-02-03 DIAGNOSIS — R5381 Other malaise: Secondary | ICD-10-CM | POA: Insufficient documentation

## 2013-02-03 DIAGNOSIS — J449 Chronic obstructive pulmonary disease, unspecified: Secondary | ICD-10-CM | POA: Insufficient documentation

## 2013-02-03 DIAGNOSIS — I4949 Other premature depolarization: Secondary | ICD-10-CM | POA: Diagnosis not present

## 2013-02-03 DIAGNOSIS — R079 Chest pain, unspecified: Secondary | ICD-10-CM | POA: Insufficient documentation

## 2013-02-03 DIAGNOSIS — R0609 Other forms of dyspnea: Secondary | ICD-10-CM | POA: Diagnosis not present

## 2013-02-03 DIAGNOSIS — E785 Hyperlipidemia, unspecified: Secondary | ICD-10-CM | POA: Diagnosis not present

## 2013-02-03 DIAGNOSIS — E119 Type 2 diabetes mellitus without complications: Secondary | ICD-10-CM | POA: Diagnosis not present

## 2013-02-03 DIAGNOSIS — R0602 Shortness of breath: Secondary | ICD-10-CM | POA: Insufficient documentation

## 2013-02-03 DIAGNOSIS — I2510 Atherosclerotic heart disease of native coronary artery without angina pectoris: Secondary | ICD-10-CM | POA: Diagnosis not present

## 2013-02-03 DIAGNOSIS — R0989 Other specified symptoms and signs involving the circulatory and respiratory systems: Secondary | ICD-10-CM | POA: Insufficient documentation

## 2013-02-03 DIAGNOSIS — J4489 Other specified chronic obstructive pulmonary disease: Secondary | ICD-10-CM | POA: Insufficient documentation

## 2013-02-03 DIAGNOSIS — I6529 Occlusion and stenosis of unspecified carotid artery: Secondary | ICD-10-CM | POA: Diagnosis not present

## 2013-02-03 DIAGNOSIS — IMO0001 Reserved for inherently not codable concepts without codable children: Secondary | ICD-10-CM | POA: Insufficient documentation

## 2013-02-03 DIAGNOSIS — I491 Atrial premature depolarization: Secondary | ICD-10-CM | POA: Diagnosis not present

## 2013-02-03 DIAGNOSIS — Z7902 Long term (current) use of antithrombotics/antiplatelets: Secondary | ICD-10-CM | POA: Diagnosis not present

## 2013-02-03 MED ORDER — REGADENOSON 0.4 MG/5ML IV SOLN
0.4000 mg | Freq: Once | INTRAVENOUS | Status: AC
  Administered 2013-02-03: 0.4 mg via INTRAVENOUS

## 2013-02-03 MED ORDER — TECHNETIUM TC 99M SESTAMIBI GENERIC - CARDIOLITE
30.0000 | Freq: Once | INTRAVENOUS | Status: AC | PRN
  Administered 2013-02-03: 30 via INTRAVENOUS

## 2013-02-03 MED ORDER — TECHNETIUM TC 99M SESTAMIBI GENERIC - CARDIOLITE
10.0000 | Freq: Once | INTRAVENOUS | Status: AC | PRN
  Administered 2013-02-03: 10 via INTRAVENOUS

## 2013-02-03 MED ORDER — AMINOPHYLLINE 25 MG/ML IV SOLN
75.0000 mg | Freq: Once | INTRAVENOUS | Status: AC
  Administered 2013-02-03: 75 mg via INTRAVENOUS

## 2013-02-03 NOTE — Progress Notes (Addendum)
[facility] SITE 3 NUCLEAR MED [address] [place], [place] [address] [phone]    Cardiology Nuclear Med Study  Christine [name] is a 79 y.o. female     MRN : [id number]     DOB: 01/24/1934  Procedure Date: 02/03/2013  Nuclear Med Background Indication for Stress Test:  Evaluation for Ischemia History:  '13 Cath:n/o CAD, medical tx; '13 Stress Echo:no ischemia, EF=75% Cardiac Risk Factors: Carotid Disease, Hypertension, Lipids and IRBBB  Symptoms:  Chest Pain (last episode of chest discomfort was just a few minutes ago, it comes and goes; none now), DOE, Fatigue and Hears Heart Beat in Ears   Nuclear Pre-Procedure Caffeine/Decaff Intake:  None > 12 hrs NPO After: 5:45am   Lungs:  O2 Sat: 96% IV 0.9% NS with Angio Cath:  22g  IV Site: R Antecubital x 1, tolerated well IV Started by:  Irean Hong, RN  Chest Size (in):  36 Cup Size: DD  Height: 5\' 5"  (1.651 m)  Weight:  133 lb (60.328 kg)  BMI:  Body mass index is 22.13 kg/(m^2). Tech Comments:  Held Amaryl x 24 hours; orange juice @ 5:45 am; 1:15 hr pc CBG was 91 per patient; 3 hr pc CBG here was 86 @8 :45 am on arrival per patient request, asymptomatic.Irean Hong, RN    Nuclear Med Study 1 or 2 day study: 1 day  Stress Test Type:  Treadmill/Lexiscan  Reading MD: Willa Rough, MD  Order Authorizing Provider:  Marca Ancona, MD  Resting Radionuclide: Technetium 64m Sestamibi  Resting Radionuclide Dose: 11.0 mCi   Stress Radionuclide:  Technetium 38m Sestamibi  Stress Radionuclide Dose: 33.0 mCi           Stress Protocol Rest HR: 44 Stress HR: 100  Rest BP: 197/76 Stress BP: 179/63  Exercise Time (min): 4:17 METS: 4.6   Predicted Max HR: 143 bpm % Max HR: 69.93 bpm Rate Pressure Product: 57846   Dose of Adenosine (mg):  n/a Dose of Lexiscan: 0.4 mg Dose of Aminophylline:75 mg  Dose of Atropine (mg): n/a Dose of Dobutamine: n/a mcg/kg/min (at max HR)  Stress Test Technologist: Smiley Houseman, CMA-N   Nuclear Technologist:  Domenic Polite, CNMT     Rest Procedure:  Myocardial perfusion imaging was performed at rest 45 minutes following the intravenous administration of Technetium 23m Sestamibi.  Rest ECG: Normal sinus rhythm. Incomplete right bundle branch block with possible old anterior MI.  Stress Procedure: The patient attempted to walk the treadmill utilizing the Bruce protocol, but was unable to reach her target heart rate.  She then received IV Lexiscan 0.4 mg over 15-seconds with concurrent low level exercise and then Technetium 24m Sestamibi was injected at 30-seconds while the patient continued walking one more minute.  She c/o chest pressure with walking the treadmill and Lexiscan and was given Aminophylline 75 mg IV with relief in recovery.  Quantitative spect images were obtained after a 45-minute delay.  Stress ECG: No significant change from baseline ECG  QPS Raw Data Images:  Normal; no motion artifact; normal heart/lung ratio. Stress Images:  Normal homogeneous uptake in all areas of the myocardium. Rest Images:  Normal homogeneous uptake in all areas of the myocardium. Subtraction (SDS):  No evidence of ischemia. Transient Ischemic Dilatation (Normal <1.22):  1.03 Lung/Heart Ratio (Normal <0.45):  0.33  Quantitative Gated Spect Images QGS EDV:  50 ml QGS ESV:  06 ml  Impression Exercise Capacity:  Lexiscan with low level exercise. BP Response:  Normal blood pressure response. Clinical Symptoms:  The patient had shortness of breath and chest pressure. The chest pressure or persisted in recovery. Patient was ultimately given Aminophyllin IV and improved. ECG Impression:  No significant ST segment change suggestive of ischemia. Comparison with Prior Nuclear Study: No previous nuclear study performed  Overall Impression:  Normal stress nuclear study. There is no significant abnormality.  LV Ejection Fraction: 88%.  LV Wall Motion:  LV wall motion is normal. The  computer estimates an abnormally high ejection fraction within the ventricle is small.  Willa Rough, MD  Normal study.  Please inform patient.  Marca Ancona 02/04/2013 9:16 AM

## 2013-02-04 NOTE — Progress Notes (Signed)
Pt aware of results 

## 2013-02-10 ENCOUNTER — Encounter: Payer: Self-pay | Admitting: Internal Medicine

## 2013-02-10 ENCOUNTER — Telehealth: Payer: Self-pay | Admitting: *Deleted

## 2013-02-10 NOTE — Telephone Encounter (Signed)
I called and spoke with Christine [name] about her reported symptoms.  I confirmed her identity by birth date.   She notes that she has occasional episodes of her hands turning white and getting very cold, no pain.  She cannot remember specific instances when it happens, as it seems to come on "any time" including when washing the dishes.  She notes that over the past year or so the symptoms have gotten worse as it used to effect only a few fingers, but now affects all 5.  It is symmetrical.  It can happen in both the summer and the winter.  She has had symptoms for years.  She uses a heating pad or hot water to improve her symptoms.  She does wear gloves in the cold.  She has had no recent changes to her medications.  She denies any skin changes concerning for necrosis or gangrene.   She has a history of PMR, for which she was treated with steroids, now completed.  Her most recent ESR was < 10.   I think she needs to be seen in the clinic for further evaluation.  She needs education in what activities to avoid, how to decrease the episodes.  I think she could also benefit from initiation of a CCB, such as amlodipine.  However, she is already on quite a few BP medications and per the most recent Cardiology note, her long acting nitrate was recently increased.    I would request that she be seen in the resident clinic in the near future for education, further evaluation and possibly initiation of a CCB, with down titration of one of her other medications if needed to avoid hypotension.   I discussed this plan with Christine [name], and she agreed to be seen in the near future.

## 2013-02-10 NOTE — Telephone Encounter (Signed)
Pt scheduled for tomorrow

## 2013-02-10 NOTE — Telephone Encounter (Signed)
Pt called with concerns about her hands.  She states they turn white and stay cold.  She thought you were going to referer her to a rheumatologist. Last visit 2/24 notes state polymyalgia rheumatica.   Pt # [id number]

## 2013-02-11 ENCOUNTER — Encounter: Payer: Medicare Other | Admitting: Internal Medicine

## 2013-02-15 ENCOUNTER — Ambulatory Visit (INDEPENDENT_AMBULATORY_CARE_PROVIDER_SITE_OTHER): Payer: Medicare Other | Admitting: Internal Medicine

## 2013-02-15 ENCOUNTER — Encounter: Payer: Self-pay | Admitting: Internal Medicine

## 2013-02-15 VITALS — BP 147/66 | HR 70 | Temp 96.9°F | Ht 65.0 in | Wt 136.5 lb

## 2013-02-15 DIAGNOSIS — E119 Type 2 diabetes mellitus without complications: Secondary | ICD-10-CM

## 2013-02-15 DIAGNOSIS — J309 Allergic rhinitis, unspecified: Secondary | ICD-10-CM | POA: Diagnosis not present

## 2013-02-15 DIAGNOSIS — I7300 Raynaud's syndrome without gangrene: Secondary | ICD-10-CM | POA: Diagnosis not present

## 2013-02-15 LAB — GLUCOSE, CAPILLARY: Glucose-Capillary: 182 mg/dL — ABNORMAL HIGH (ref 70–99)

## 2013-02-15 MED ORDER — LORATADINE 10 MG PO TABS: 10.0000 mg | ORAL_TABLET | Freq: Every day | ORAL | Status: DC | PRN

## 2013-02-15 NOTE — Progress Notes (Signed)
[name], female   DOB: 01/24/1934, 79 y.o.   MRN: [id number]  Subjective:   [name] female   DOB: 01/24/1934 79 y.o.   MRN: [id number]  HPI: [name] is a 79 y.o. female with history of COPD, CAD, hypothyroidism, and h/o PMR presenting to the clinic for evaluation of color change of the fingers. She says that for several years now, she has noticed occasional episodes of her hands turning white and getting very cold. At first this happened to just a few fingers over both hands. She says it comes on when she is cold or washing the dishes. For the past year, all 5 fingers on both hands are now affected, with whiteness going down to the MCP joints of each hand. Episodes occur about twice a week. This is been happening into this summer in the wintertime. She uses a heating pad or hot water when this happens and usually results in regain of color within a few minutes. She's not noticed any darkening fingertips, numbness in her fingertips, or other skin changes. She's not changing her medications recently.she does wear gloves in the cold. She's not a smoker. She has a history of PMR, for which she was treated with steroids, now completed. Her most recent ESR was 10 approximately 1 month ago. She denies any current joint pain/swelling or temporal headache. She denies any skin tightening, dysphagia, or rash. Denies fever, chills, weight loss, shortness of breath, cough, abdominal pain, nausea, vomiting, diarrhea, numbness or tingling.    Past Medical History  Diagnosis Date  . GERD (gastroesophageal reflux disease)   . Hypertension   . Hypothyroidism   . Anemia   . Hyperlipidemia   . Sinus arrhythmia   . History of migraine headaches     Next atypical symptoms in the past. Patient was started on Neurontin for possible neuropathic origin of her pain.  Marland Kitchen COPD (chronic obstructive pulmonary disease)     PFTs 2011 show FEV1/FVC of 96%  . Heart murmur, systolic      2-D echo in December 2011 showed a normal EF with grade 1 diastolic dysfunction, trivial pulmonary regurgitation and mildly elevated PA pressure at 37 mmHg probably secondary to her COPD.dynamic obstruction-mid cavity obliteration,   . CAD (coronary artery disease)     LHC 08/23/11: dLM 40-50%, oRI 40%, oCFX 40%, oD1 70% (small and not amenable to PCI).  LM lesion did not appear to be flow limiting.  Medical rx was recommended.  Echo 08/23/11: mild LVH, EF 60-65%, grade 1 diast dysfxn, mild BAE, PASP 24.    . Carotid stenosis     dopplers 10/12:  0-39% bilat ICA (repeat 08/2012)  . Migraines   . Heart murmur   . Angina   . Chronic bronchitis   . Shortness of breath on exertion   . Type II diabetes mellitus     On oral hypoglycemic agents.  . Blood transfusion   . History of stomach ulcers 1970's  . Arthritis   . Polymyalgia rheumatica    Current Outpatient Prescriptions  Medication Sig Dispense Refill  . acetaminophen (TYLENOL) 500 MG tablet Take 1,000 mg by mouth every 6 (six) hours as needed. For pain      . ADVAIR DISKUS 250-50 MCG/DOSE AEPB inhale 1 dose by mouth every 12 hours  60 each  2  . atorvastatin (LIPITOR) 40 MG tablet Take 1 tablet (40 mg total) by mouth daily.  90 tablet  3  .  clopidogrel (PLAVIX) 75 MG tablet take 1 tablet by mouth once daily  30 tablet  5  . glimepiride (AMARYL) 1 MG tablet Take 1 tablet (1 mg total) by mouth daily before breakfast.  30 tablet  3  . glucose blood (ONE TOUCH ULTRA TEST) test strip Use as instructed  100 each  12  . guaiFENesin-codeine (ROBITUSSIN AC) 100-10 MG/5ML syrup Take 5 mLs by mouth 4 (four) times daily as needed for cough.  120 mL  1  . guaiFENesin-dextromethorphan (ROBITUSSIN DM) 100-10 MG/5ML syrup Take 5 mLs by mouth 3 (three) times daily as needed for cough.  118 mL  0  . imipramine (TOFRANIL) 50 MG tablet Take 1 tablet (50 mg total) by mouth 2 (two) times daily.  60 tablet  3  . isosorbide mononitrate (IMDUR) 60 MG 24 hr  tablet 2 tablets (total 120mg ) daily  60 tablet  3  . levothyroxine (SYNTHROID, LEVOTHROID) 25 MCG tablet Take 1 tablet (25 mcg total) by mouth daily.  90 tablet  1  . loratadine (CLARITIN) 10 MG tablet Take 10 mg by mouth daily as needed. For allergies      . metoprolol succinate (TOPROL-XL) 100 MG 24 hr tablet Take 1 tablet (100 mg total) by mouth daily. Take with or immediately following a meal.  30 tablet  11  . Multiple Vitamin (MULITIVITAMIN WITH MINERALS) TABS Take 1 tablet by mouth daily.      . nitroGLYCERIN (NITROSTAT) 0.4 MG SL tablet Place 0.4 mg under the tongue every 5 (five) minutes as needed. For chest pain      . ondansetron (ZOFRAN) 4 MG tablet Take 1 tablet (4 mg total) by mouth daily as needed for nausea.  30 tablet  1  . ranitidine (ZANTAC) 150 MG tablet Take 1 tablet (150 mg total) by mouth 2 (two) times daily.  60 tablet  1  . valsartan (DIOVAN) 160 MG tablet Take 160 mg by mouth daily.      . VENTOLIN HFA 108 (90 BASE) MCG/ACT inhaler inhale 2 puffs by mouth every 4 to 6 hours if needed for shortness of breath  18 g  3  . vitamin C (ASCORBIC ACID) 500 MG tablet Take 500 mg by mouth daily.       No current facility-administered medications for this visit.   Family History  Problem Relation Age of Onset  . Colon cancer Mother 92  . COPD Father 82   History   Social History  . Marital Status: Widowed    Spouse Name: N/A    Number of Children: N/A  . Years of Education: N/A   Social History Main Topics  . Smoking status: Never Smoker   . Smokeless tobacco: Never Used  . Alcohol Use: No     Comment: "Last drink 1967"  . Drug Use: No  . Sexually Active: None   Other Topics Concern  . None   Social History Narrative    Never smoked. Care for her daughter who has had a lung transplant. 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  Patient is a well-developed and well-nourished female in no acute distress and cooperative with exam. Alert and oriented x3.  Head: Normocephalic and atraumatic Mouth: no erythema or exudates, MMM Eyes: PERRL, EOMI, conjunctivae normal, No scleral icterus.  Neck: Supple, no mass Cardiovascular: RRR, S1 normal, S2 normal, no MRG, pulses symmetric and intact bilaterally Pulmonary/Chest: CTAB, no wheezes, rales, or rhonchi Abdominal: Soft. Non-tender, non-distended, bowel sounds are normal, no masses, organomegaly, or guarding present.  Musculoskeletal: No joint deformities, erythema, or stiffness, ROM full and no nontender Hematology: no cervical adenopathy or visible bruising Neurological: A&O x3, Strength is normal and symmetric bilaterally, cranial nerve II-XII are grossly intact, no focal motor deficit, sensory intact to light touch bilaterally.  Skin: Warm, dry and intact. No rash, cyanosis, or clubbing.  Extremities: good perfusion and pulses of hands and feet. No skin discoloration. No dilated capillary loops of fingernails by capillaroscopy  Psychiatric: Normal mood and affect. Assessment & Plan:   Please see problem-based charting for assessment and plan.

## 2013-02-15 NOTE — Patient Instructions (Addendum)
Raynaud's Syndrome  Raynaud's Syndrome is a disorder of the blood vessels in your hands and feet. It occurs when small arteries of the arms/hands or legs/feet become sensitive to cold or emotional upset. This causes the arteries to constrict, or narrow, and reduces blood flow to the area. The color in the fingers or toes changes from white to bluish to red and this is not usually painful. There may be numbness and tingling. Sores on the skin (ulcers) can form. Symptoms are usually relieved by warming.  HOME CARE INSTRUCTIONS     Avoid exposure to cold. Keep your whole body warm and dry. Dress in layers. Wear mittens or gloves when handling ice or frozen food and when outdoors. Use holders for glasses or cans containing cold drinks. If possible, stay indoors during cold weather.   Limit your use of caffeine. Switch to decaffeinated coffee, tea, and soda pop. Avoid chocolate.   Avoid smoking or being around cigarette smoke. Smoke will make symptoms worse.   Wear loose fitting socks and comfortable, roomy shoes.   Avoid vibrating tools and machinery.   If possible, avoid stressful and emotional situations. Exercise, meditation and yoga may help you cope with stress. Biofeedback may be useful.   Ask your caregiver about medicine (calcium channel blockers) that may control Raynaud's phenomena.  SEEK MEDICAL CARE IF:     Your discomfort becomes worse, despite conservative treatment.   You develop sores on your fingers and toes that do not heal.  Document Released: 11/01/2000 Document Revised: 01/27/2012 Document Reviewed: 11/08/2008  [employer] Patient Information 2013 [employer]

## 2013-02-15 NOTE — Assessment & Plan Note (Addendum)
Signs and sx consistent w Raynaud's phenomenon. Likely related to mild rheumatologic dz, as pt w h/o PMR. No evidence of active joint inflammation today. No dysphagia, skin changes, or rash to suggest crest syndrome. No dilated loops on capillaroscopy. No evidence of skin necrosis. Options for treatment including conservative strategies for avoiding episodes and actions to restore perfusion were discussed. Option of starting a calcium channel blocker for symptom relief was also discussed. Patient was adamant this time that she does not want to start an additional medication. I educated her provided literature about strategies to minimize recurrence of her episodes and to help restore perfusion when they do occur.I told her to return to the clinic if symptoms become increasingly bothersome or notices any changes consistent with skin necrosis. She is agreeable.

## 2013-02-18 ENCOUNTER — Encounter: Payer: Self-pay | Admitting: Physician Assistant

## 2013-02-18 ENCOUNTER — Ambulatory Visit (INDEPENDENT_AMBULATORY_CARE_PROVIDER_SITE_OTHER): Payer: Medicare Other | Admitting: Physician Assistant

## 2013-02-18 VITALS — BP 146/62 | HR 67 | Ht 65.0 in | Wt 136.8 lb

## 2013-02-18 DIAGNOSIS — E785 Hyperlipidemia, unspecified: Secondary | ICD-10-CM

## 2013-02-18 DIAGNOSIS — R079 Chest pain, unspecified: Secondary | ICD-10-CM

## 2013-02-18 DIAGNOSIS — I10 Essential (primary) hypertension: Secondary | ICD-10-CM

## 2013-02-18 DIAGNOSIS — I2510 Atherosclerotic heart disease of native coronary artery without angina pectoris: Secondary | ICD-10-CM | POA: Diagnosis not present

## 2013-02-18 NOTE — Progress Notes (Signed)
[address], [address] [place], [place]  [address] Phone: [phone] Fax:  [phone]  Date:  02/18/2013   ID:  Christine [name], DOB 01/24/1934, MRN [id number]  PCP:  Debe Coder, MD  Primary Cardiologist:  Dr. Marca Ancona     History of Present Illness: Christine [name] is a 79 y.o. female who returns for f/u on CP.  She has a hx of CAD, DM2, HTN, HL, CKD, hypothyroidism, COPD, PMR, ASA allergy.   She was seen in 10/12 with chest pain.  LHC: 40-50% distal left main stenosis that was not thought to be flow-limiting. She additionally had a 75% stenosis in a diagonal. Chest pain resolved with medical therapy. She was admitted again in 4/13 with chest pain and exertional dyspnea. Cardiac enzymes were negative. LHC looked similar to prior with 50% distal LM, 70% ostial D1, and 70% ostial ramus. Echo showed preserved EF. A dobutamine stress echo was done, this was submaximal but did not show evidence for ischemia. No intervention and medical management was continued.  She had done well with anti-anginals.  However, she saw Dr. Marca Ancona 01/29/13 with c/o's increased CP, dyspnea and fatigue.  Imdur was increased to 120 mg QD.  Lexiscan/ETT-Sestamibi 02/03/13:  EF 88%, no ischemia, normal study.  Since last seen she has done well.  Denies further CP.  Fatigue is improved.  No DOE.  No syncope.  No orthopnea, PND, edema.    Labs (2/13): K 3.3, creatinine 0.9  Labs (3/13): HDL 42, LDL 49  Labs (4/13): K 4.1, creatinine 1.01  Labs (10/13): LDL 56, HDL 38  Labs (2/14): K 3.8, creatinine 0.98  Wt Readings from Last 3 Encounters:  02/18/13 136 lb 12.8 oz (62.052 kg)  02/15/13 136 lb 8 oz (61.916 kg)  02/03/13 133 lb (60.328 kg)     Past Medical History: 1. CAD: Chest pain 10/12. LHC with 40-50% distal LM stenosis, 70-75% ostial D1. No flow-limiting lesions, medically managed. Echo (10/12): EF 60-65%, mild LV hypertrophy, PA systolic pressure 24 mmHg. Chest pain 4/13. LHC with 50%  distal LM, 70% ostial D1, 50% proximal LAD, 70% proximal ramus. No flow-limiting lesions, medically managed. Echo (4/13) with EF 60-65%, mild LVH. DSE (4/13) with no ischemia but it was a submaximal study.  Lexiscan/ETT-Sestamibi 02/03/13:  EF 88%, no ischemia, normal study.   2. DM2  3. HTN  4. Hyperlipidemia  5. CKD  6. Carotid dopplers (10/12): 0-39% bilateral carotid stenosis.  7. Allergy to aspirin => rash.  8. GERD 9. Migraines  10. Hypothyroidism  11. COPD  12. PMR   Current Outpatient Prescriptions  Medication Sig Dispense Refill  . acetaminophen (TYLENOL) 500 MG tablet Take 1,000 mg by mouth every 6 (six) hours as needed. For pain      . ADVAIR DISKUS 250-50 MCG/DOSE AEPB inhale 1 dose by mouth every 12 hours  60 each  2  . atorvastatin (LIPITOR) 40 MG tablet Take 1 tablet (40 mg total) by mouth daily.  90 tablet  3  . clopidogrel (PLAVIX) 75 MG tablet take 1 tablet by mouth once daily  30 tablet  5  . glimepiride (AMARYL) 1 MG tablet Take 1 tablet (1 mg total) by mouth daily before breakfast.  30 tablet  3  . guaiFENesin-codeine (ROBITUSSIN AC) 100-10 MG/5ML syrup Take 5 mLs by mouth 4 (four) times daily as needed for cough.  120 mL  1  . guaiFENesin-dextromethorphan (ROBITUSSIN DM) 100-10 MG/5ML syrup Take 5 mLs  by mouth 3 (three) times daily as needed for cough.  118 mL  0  . imipramine (TOFRANIL) 50 MG tablet Take 1 tablet (50 mg total) by mouth 2 (two) times daily.  60 tablet  3  . isosorbide mononitrate (IMDUR) 60 MG 24 hr tablet 2 tablets (total 120mg ) daily  60 tablet  3  . levothyroxine (SYNTHROID, LEVOTHROID) 25 MCG tablet Take 1 tablet (25 mcg total) by mouth daily.  90 tablet  1  . loratadine (CLARITIN) 10 MG tablet Take 1 tablet (10 mg total) by mouth daily as needed. For allergies  30 tablet  5  . metoprolol succinate (TOPROL-XL) 100 MG 24 hr tablet Take 1 tablet (100 mg total) by mouth daily. Take with or immediately following a meal.  30 tablet  11  . Multiple  Vitamin (MULITIVITAMIN WITH MINERALS) TABS Take 1 tablet by mouth daily.      . nitroGLYCERIN (NITROSTAT) 0.4 MG SL tablet Place 0.4 mg under the tongue every 5 (five) minutes as needed. For chest pain      . ondansetron (ZOFRAN) 4 MG tablet Take 1 tablet (4 mg total) by mouth daily as needed for nausea.  30 tablet  1  . ranitidine (ZANTAC) 150 MG tablet Take 150 mg by mouth as needed.      . valsartan (DIOVAN) 160 MG tablet Take 160 mg by mouth daily.      . VENTOLIN HFA 108 (90 BASE) MCG/ACT inhaler inhale 2 puffs by mouth every 4 to 6 hours if needed for shortness of breath  18 g  3  . vitamin C (ASCORBIC ACID) 500 MG tablet Take 500 mg by mouth daily.       No current facility-administered medications for this visit.    Allergies:    Allergies  Allergen Reactions  . Aspirin Swelling and Other (See Comments)    REACTION: Angioedema  . Codeine Nausea And Vomiting    "makes me deathly sick"  . Penicillins Nausea And Vomiting    "makes me go to the bathroom real bad"    Social History:  The patient  reports that she has never smoked. She has never used smokeless tobacco. She reports that she does not drink alcohol or use illicit drugs.   ROS:  Please see the history of present illness.      All other systems reviewed and negative.   PHYSICAL EXAM: VS:  BP 146/62  Pulse 67  Ht 5\' 5"  (1.651 m)  Wt 136 lb 12.8 oz (62.052 kg)  BMI 22.76 kg/m2  LMP 02/10/1966 Well nourished, well developed, in no acute distress HEENT: normal Neck: no JVD Cardiac:  normal S1, S2; RRR; 2/6 systolic murmur heard best at RUSB Lungs:  clear to auscultation bilaterally, no wheezing, rhonchi or rales Abd: soft, nontender, no hepatomegaly Ext: no edema Skin: warm and dry Neuro:  CNs 2-12 intact, no focal abnormalities noted  EKG:  NSR, HR 67, no acute changes     ASSESSMENT AND PLAN:  1. CAD:  Chest pain improved.  Recent nuclear study normal.  Continue current Rx.  Continue Plavix, beta blocker,  ARB, statin, nitrates. 2. Hypertension:  She missed some of her medications this AM.  BP usually better controlled.  Continue current Rx. 3. Hyperlipidemia:  Continue statin.  4. Disposition:  F/u with Dr. Marca Ancona in 07/2013 as planned or sooner prn.  Luna Glasgow, PA-C  8:36 AM 02/18/2013

## 2013-02-18 NOTE — Patient Instructions (Addendum)
Your physician wants you to follow-up in: 6 MONTHS WITH DR. Shirlee Latch. You will receive a reminder letter in the mail two months in advance. If you don't receive a letter, please call our office to schedule the follow-up appointment.   NO CHANGES WERE MADE TODAY

## 2013-03-04 ENCOUNTER — Other Ambulatory Visit: Payer: Self-pay | Admitting: Cardiology

## 2013-03-04 ENCOUNTER — Other Ambulatory Visit: Payer: Self-pay | Admitting: Internal Medicine

## 2013-03-10 ENCOUNTER — Other Ambulatory Visit: Payer: Self-pay | Admitting: Internal Medicine

## 2013-03-11 ENCOUNTER — Other Ambulatory Visit: Payer: Self-pay | Admitting: Internal Medicine

## 2013-03-12 NOTE — Telephone Encounter (Signed)
I'm not sure what medicine this is.  It is not on her med list.  ?  Can she call and clarify?   Thanks

## 2013-03-16 ENCOUNTER — Other Ambulatory Visit: Payer: Self-pay | Admitting: Internal Medicine

## 2013-03-16 NOTE — Telephone Encounter (Signed)
Pt states she already has this medication.

## 2013-04-06 ENCOUNTER — Other Ambulatory Visit: Payer: Self-pay | Admitting: Internal Medicine

## 2013-04-07 ENCOUNTER — Other Ambulatory Visit: Payer: Self-pay | Admitting: *Deleted

## 2013-04-07 MED ORDER — METOPROLOL SUCCINATE ER 100 MG PO TB24: 100.0000 mg | ORAL_TABLET | Freq: Every day | ORAL | Status: DC

## 2013-04-20 ENCOUNTER — Other Ambulatory Visit: Payer: Self-pay | Admitting: *Deleted

## 2013-04-20 MED ORDER — NITROGLYCERIN 0.4 MG SL SUBL: 0.4000 mg | SUBLINGUAL_TABLET | SUBLINGUAL | Status: DC | PRN

## 2013-04-28 ENCOUNTER — Telehealth: Payer: Self-pay | Admitting: Cardiology

## 2013-04-28 NOTE — Telephone Encounter (Signed)
New Prob     Pt has some questions regarding her medication. Please call.

## 2013-04-28 NOTE — Telephone Encounter (Signed)
Spoke with patient. Pt states she has been having frequent stools, some diarrhea,  as much as 3-4 times /day. I recommended pt follow up with her PCP for further evaluation

## 2013-05-03 ENCOUNTER — Encounter: Payer: Self-pay | Admitting: Internal Medicine

## 2013-05-03 ENCOUNTER — Ambulatory Visit (INDEPENDENT_AMBULATORY_CARE_PROVIDER_SITE_OTHER): Payer: Medicare Other | Admitting: Internal Medicine

## 2013-05-03 VITALS — BP 131/60 | HR 61 | Temp 97.0°F | Wt 136.4 lb

## 2013-05-03 DIAGNOSIS — E119 Type 2 diabetes mellitus without complications: Secondary | ICD-10-CM | POA: Diagnosis not present

## 2013-05-03 DIAGNOSIS — K219 Gastro-esophageal reflux disease without esophagitis: Secondary | ICD-10-CM

## 2013-05-03 MED ORDER — RANITIDINE HCL 75 MG PO TABS: 75.0000 mg | ORAL_TABLET | Freq: Every day | ORAL | Status: DC

## 2013-05-03 MED ORDER — RANITIDINE HCL 75 MG PO TABS: 75.0000 mg | ORAL_TABLET | Freq: Two times a day (BID) | ORAL | Status: DC

## 2013-05-03 NOTE — Progress Notes (Signed)
[name], female   DOB: 01/24/1934, 79 y.o.   MRN: [id number]  Subjective:   [name] female   DOB: 01/24/1934 79 y.o.   MRN: [id number]  HPI: [name] is a 79 y.o. female with history of PMR, HTN, HLD, CAD, anemia and GERD presenting to the clinic for follow up of GERD. The patient reports consistent success of suppression of GERD symptoms on both H2 blockers and PPIs, however she is unable to tolerate previous trials of either medication due to side effect of loose stools 3-4 times a day. She first tried Nexium, but stopped this after having continued loose stools. The loose stools resolved but her cough and acid burning persisted. She was given a trial of Protonix, which again alleviated her GERD symptoms but gave her loose stools which she could not tolerate.  At last visit was  given Zantac 150 mg BID, but she has been unable to tolerate this due to side effects of loose stools as well. She downtitrated dose to 150mg  twice weekly and still had loose stools. Her last dose of Zantac was 2 weeks ago. Since stopping the medication, she has experienced a return of her acid reflux symptoms, but her loose stools have resolved. The patient says that her cough resolves completely when she is on medicine for her GERD. She also says that she never has loose stools when she stops taking the medicine. Denies any blood in her stool or tenesmus. Denies any wheezing, history of asthma, or shortness of breath. Denies any recent changes to her diet. Denies any fevers, chills, nausea, vomiting, current abdominal pain.  Past Medical History  Diagnosis Date  . GERD (gastroesophageal reflux disease)   . Hypertension   . Hypothyroidism   . Anemia   . Hyperlipidemia   . Sinus arrhythmia   . History of migraine headaches     Next atypical symptoms in the past. Patient was started on Neurontin for possible neuropathic origin of her pain.  Marland Kitchen COPD (chronic obstructive pulmonary  disease)     PFTs 2011 show FEV1/FVC of 96%  . Heart murmur, systolic     2-D echo in December 2011 showed a normal EF with grade 1 diastolic dysfunction, trivial pulmonary regurgitation and mildly elevated PA pressure at 37 mmHg probably secondary to her COPD.dynamic obstruction-mid cavity obliteration,   . CAD (coronary artery disease)     LHC 08/23/11: dLM 40-50%, oRI 40%, oCFX 40%, oD1 70% (small and not amenable to PCI).  LM lesion did not appear to be flow limiting.  Medical rx was recommended.  Echo 08/23/11: mild LVH, EF 60-65%, grade 1 diast dysfxn, mild BAE, PASP 24.    . Carotid stenosis     dopplers 10/12:  0-39% bilat ICA (repeat 08/2012)  . Migraines   . Heart murmur   . Angina   . Chronic bronchitis   . Shortness of breath on exertion   . Type II diabetes mellitus     On oral hypoglycemic agents.  . Blood transfusion   . History of stomach ulcers 1970's  . Arthritis   . Polymyalgia rheumatica    Current Outpatient Prescriptions  Medication Sig Dispense Refill  . acetaminophen (TYLENOL) 500 MG tablet Take 1,000 mg by mouth every 6 (six) hours as needed. For pain      . ADVAIR DISKUS 250-50 MCG/DOSE AEPB inhale 1 dose by mouth every 12 hours  60 each  2  . atorvastatin (LIPITOR)  40 MG tablet Take 1 tablet (40 mg total) by mouth daily.  90 tablet  3  . clopidogrel (PLAVIX) 75 MG tablet take 1 tablet by mouth once daily  30 tablet  5  . DIOVAN 160 MG tablet take 1 tablet by mouth once daily  90 tablet  4  . glimepiride (AMARYL) 1 MG tablet take 1 tablet by mouth once daily before BREAKFAST  30 tablet  3  . guaiFENesin-codeine (ROBITUSSIN AC) 100-10 MG/5ML syrup Take 5 mLs by mouth 4 (four) times daily as needed for cough.  120 mL  1  . guaiFENesin-dextromethorphan (ROBITUSSIN DM) 100-10 MG/5ML syrup Take 5 mLs by mouth 3 (three) times daily as needed for cough.  118 mL  0  . imipramine (TOFRANIL) 50 MG tablet Take 1 tablet (50 mg total) by mouth 2 (two) times daily.  60 tablet   3  . isosorbide mononitrate (IMDUR) 60 MG 24 hr tablet 2 tablets (total 120mg ) daily  60 tablet  3  . levothyroxine (SYNTHROID, LEVOTHROID) 25 MCG tablet Take 1 tablet (25 mcg total) by mouth daily.  90 tablet  1  . loratadine (CLARITIN) 10 MG tablet Take 1 tablet (10 mg total) by mouth daily as needed. For allergies  30 tablet  5  . metoprolol succinate (TOPROL-XL) 100 MG 24 hr tablet Take 1 tablet (100 mg total) by mouth daily. Take with or immediately following a meal.  30 tablet  11  . Multiple Vitamin (MULITIVITAMIN WITH MINERALS) TABS Take 1 tablet by mouth daily.      . nitroGLYCERIN (NITROSTAT) 0.4 MG SL tablet Place 1 tablet (0.4 mg total) under the tongue every 5 (five) minutes as needed for chest pain. For chest pain  25 tablet  6  . ondansetron (ZOFRAN) 4 MG tablet Take 1 tablet (4 mg total) by mouth daily as needed for nausea.  30 tablet  1  . ONE TOUCH ULTRA TEST test strip use as directed  100 each  12  . ONETOUCH DELICA LANCETS 33G MISC use as directed  100 each  6  . ranitidine (ZANTAC 75) 75 MG tablet Take 1 tablet (75 mg total) by mouth at bedtime.  30 tablet  2  . VENTOLIN HFA 108 (90 BASE) MCG/ACT inhaler inhale 2 puffs by mouth every 4 to 6 hours if needed for shortness of breath  18 g  3  . vitamin C (ASCORBIC ACID) 500 MG tablet Take 500 mg by mouth daily.       No current facility-administered medications for this visit.   Family History  Problem Relation Age of Onset  . Colon cancer Mother 57  . COPD Father 6   History   Social History  . Marital Status: Widowed    Spouse Name: N/A    Number of Children: N/A  . Years of Education: N/A   Social History Main Topics  . Smoking status: Never Smoker   . Smokeless tobacco: Never Used  . Alcohol Use: No     Comment: "Last drink 1967"  . Drug Use: No  . Sexually Active: None   Other Topics Concern  . None   Social History Narrative    Never smoked. Care for her daughter who has had a lung transplant.  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 General: Thin female sitting in chair, NAD HEENT: EOMI, no scleral icterus Cardiac: RRR  Pulm: CTAB, no wheezes or rales Abd: soft, nontender, nondistended, BS present Ext: warm and well perfused, no pedal edema Neuro: alert and oriented X3, cranial nerves II-XII grossly intact, strength and sensation to light touch equal in bilateral upper and lower extremities  Assessment & Plan:   Please see problem-based charting for assessment and plan.

## 2013-05-03 NOTE — Patient Instructions (Addendum)
1. Try taking a lower dose of Zantac 75mg  AT NIGHT.  Try taking tums with meals.  2. Come back in 3 weeks for your appointment with Dr. Criselda Peaches

## 2013-05-03 NOTE — Progress Notes (Signed)
I discussed patient with resident Dr. Ziemer, and I agree with the plans as outlined in her note. 

## 2013-05-03 NOTE — Assessment & Plan Note (Signed)
The patient reports consistent success of suppression of GERD symptoms on both H2 blockers and PPIs, however she is unable to tolerate previous trials of either medication due to side effect of loose stools 3-4 times a day. She first tried Nexium, but stopped this after having continued loose stools. The loose stools resolved but her cough and acid burning persisted. She was given a trial of Protonix, which again alleviated her GERD symptoms but gave her loose stools which she cannot tolerate. As recently she's been given Zantac 150 mg, but she has been unable to tolerate this due to side effects of loose stools as well. Her last dose of Zantac was 2 weeks ago. Since stopping the medication, she has experienced a return of her acid reflux symptoms, but her loose stools have resolved. I do not see a current indication for GI referral this time, as patient has not failed acid suppression therapy for GERD symptoms but rather cannot tolerate the side effects. - Decrease Zantac to 75 mg daily - Recommend TUMS with meals - Followup with symptoms with PCP appointment in 3 weeks. - Consider further workup of chronic cough if indicated

## 2013-05-24 ENCOUNTER — Ambulatory Visit: Payer: Medicare Other | Admitting: Internal Medicine

## 2013-05-27 ENCOUNTER — Other Ambulatory Visit: Payer: Self-pay

## 2013-06-04 ENCOUNTER — Encounter: Payer: Self-pay | Admitting: Internal Medicine

## 2013-06-04 ENCOUNTER — Ambulatory Visit (INDEPENDENT_AMBULATORY_CARE_PROVIDER_SITE_OTHER): Payer: Medicare Other | Admitting: Internal Medicine

## 2013-06-04 ENCOUNTER — Telehealth: Payer: Self-pay | Admitting: *Deleted

## 2013-06-04 VITALS — BP 137/60 | HR 55 | Temp 98.2°F | Ht 65.0 in | Wt 133.4 lb

## 2013-06-04 DIAGNOSIS — M79609 Pain in unspecified limb: Secondary | ICD-10-CM | POA: Diagnosis not present

## 2013-06-04 DIAGNOSIS — R059 Cough, unspecified: Secondary | ICD-10-CM | POA: Diagnosis not present

## 2013-06-04 DIAGNOSIS — R197 Diarrhea, unspecified: Secondary | ICD-10-CM | POA: Diagnosis not present

## 2013-06-04 DIAGNOSIS — M79651 Pain in right thigh: Secondary | ICD-10-CM | POA: Insufficient documentation

## 2013-06-04 DIAGNOSIS — R05 Cough: Secondary | ICD-10-CM

## 2013-06-04 MED ORDER — ONDANSETRON HCL 4 MG PO TABS: 4.0000 mg | ORAL_TABLET | Freq: Every day | ORAL | Status: DC | PRN

## 2013-06-04 MED ORDER — TRAMADOL HCL 50 MG PO TABS: 50.0000 mg | ORAL_TABLET | Freq: Three times a day (TID) | ORAL | Status: DC | PRN

## 2013-06-04 MED ORDER — LIDOCAINE 5 % EX PTCH: 1.0000 | MEDICATED_PATCH | CUTANEOUS | Status: DC

## 2013-06-04 NOTE — Patient Instructions (Addendum)
Thanks for your visit. - Please follow up next week so we can review your stool and blood studies. - Please use your stool study cards this week, especially if your stools are very dark or become bloody. Return these to the lab when you come back next week. - We think your thigh pain may be related to your PMR. We are checking some blood studies to determine this. If they're positive we may need to refer you to a rheumatologist next week. - I have prescribed lidocaine patches to help with your pain. Please apply one patch to your thigh at night. Do not use more than one patch per day. - I have also ordered Tramadol 50mg  for pain. Please take this at night as it might make you sleepy. - I have reordered your Zofran for nausea as needed as we discussed.

## 2013-06-04 NOTE — Telephone Encounter (Signed)
Pt called problems past 2 months leaking black material from rectum. Sees [facility] - told pt she needs a referral from PCP. Past 2 days front right thigh hurting. Pt denies any injury or discoloration to area. Not sleeping due to " hurting". Appt made today 3:30PM Dr Claudell Kyle. Pt aware. Stanton Kidney Desmond Tufano RN 06/04/13 12N.

## 2013-06-04 NOTE — Assessment & Plan Note (Addendum)
Patient with a history of chronic malodorous, loose stools in the setting of recent changes to her GERD medications. Exam remarkable for decreased rectal tone. Guaiac negative. Differential includes both inflammatory and noninflammatory causes of diarrhea, some of the most common and likely being C. difficile infection (which has been associated with the use of PPIs), other chronic infections, malabsorption, IBS, Giardia. - Stool studies ordered including osmolarity studies, ova and parasite studies, fecal fat, C. difficile antigen, and stool white blood cell count. - Patient was sent home with 3 Hemoccult cards to use, especially if her stools are very dark. She will bring them back next week. - She will followup for her results next week. - Possible GI referral at that time.

## 2013-06-04 NOTE — Progress Notes (Signed)
Subjective:    [name] Height, female    DOB: 01/24/1934, 79 y.o.   MRN: [id number]  HPI [name] is a 79 y.o. female with history of PMR, DM2, HTN, HLD, CAD, hypothyroidism, anemia and GERD presenting to the clinic for acute complaints of right thigh pain and loose stools.  Thigh pain:  The patient has had right thigh pain for the past 2-3 days. She describes it as a cramping pain, worse when she wakes up in the morning. It is located in her anterior thigh and does not radiate. Denies injuries or falls. She has trouble describing the pain but says it is not throbbing, sharp, or burning. She rates it a 7/10. Activity or use of the thigh make no difference. Tylenol and a heating pad have not helped. She has had trouble sleeping because of the pain. She has a history of arthritis in her hips, but this pain is different. It is also different from PMR pain she has had in the past, which was previously in her feet. She does not have a rheumatologist.  Loose stools: The patient has a history of developing loose stools after starting new medications for her GERD, including various PPIs and H2 blockers. In the past her loose stools have resolved after discontinuing the offending medications. However, she last took Zantac on July 7 and has continued to have loose stools ever since. She cannot quantify exactly how often she defecates, but states that she has to use a pad every day because she goes so often. She says her stool is sometimes very dark and almost black. Other times it is a normal brown color. Denies bright red blood. Denies abdominal pain, vomiting, bloating, straining, an urge to defecate. She sometimes has nausea. Denies dizziness or lightheadedness. She states her stool has a terrible smell, to the point where she is embarrassed if she soils her pad around family.  Colonoscopy from 2009 showed nonbleeding diverticulosis from the descending colon to the sigmoid colon. Thickened  red haustral folds were noted.    Review of Systems  Constitutional: Negative for fever and chills.  Eyes: Negative for visual disturbance.  Respiratory: Negative for shortness of breath.   Cardiovascular: Negative for chest pain.  Gastrointestinal: Positive for nausea and diarrhea. Negative for vomiting, abdominal pain, constipation, abdominal distention and rectal pain.  Endocrine: Negative for polyuria.  Genitourinary: Negative for dysuria, frequency and decreased urine volume.  Skin: Negative for rash.  Neurological: Negative for dizziness, syncope, weakness, light-headedness and headaches.       Objective:     Physical Exam  Constitutional: She appears well-developed and well-nourished. No distress.  HENT:  Head: Normocephalic and atraumatic.  Mouth/Throat: Oropharynx is clear and moist and mucous membranes are normal.  Eyes: Pupils are equal, round, and reactive to light. No scleral icterus.  Normal sclera.  Cardiovascular: Normal rate, regular rhythm, normal heart sounds and intact distal pulses.  Exam reveals no gallop and no friction rub.   No murmur heard. Pulmonary/Chest: Effort normal and breath sounds normal.  Abdominal: Soft. She exhibits no distension. Bowel sounds are increased. There is no tenderness.  Genitourinary: Rectal exam shows anal tone abnormal. Rectal exam shows no external hemorrhoid, no internal hemorrhoid, no fissure, no mass and no tenderness. Guaiac negative stool.  Decreased anal tone. Loose brown stool noted in rectal vault.  Musculoskeletal:       Right hip: Normal. She exhibits normal range of motion, normal strength, no tenderness, no swelling  and no deformity.       Right knee: Normal. She exhibits normal range of motion, no swelling, no effusion and no deformity.  No tenderness to palpation of the quadriceps muscle, knee joint, or hip joint.  Neurological: She has normal strength. No sensory deficit.  Skin: Skin is warm and dry. She is not  diaphoretic. No pallor.  Normal skin turgor.    Filed Vitals:   06/04/13 1556  BP: 137/60  Pulse: 55  Temp: 98.2 F (36.8 C)         Assessment & Plan:

## 2013-06-04 NOTE — Assessment & Plan Note (Addendum)
The pain appears to be mostly muscular in nature localized to the quadriceps. This may be a new manifestation of her PMR which classically involves the girdle muscles. - Ordered Lidocaine patches and tramadol 50 mg for pain relief.  - Ordered Zofran for nausea as needed. Patient has an allergy to codeine (nausea) but like to try tramadol as long as she also has an antinausea medication on hand. - ESR and CRP have been ordered. - Followup in one week with possible referral to rheumatology.

## 2013-06-05 LAB — BASIC METABOLIC PANEL WITH GFR
BUN: 14 mg/dL (ref 6–23)
Calcium: 9.9 mg/dL (ref 8.4–10.5)
GFR, Est African American: 52 mL/min — ABNORMAL LOW
GFR, Est Non African American: 45 mL/min — ABNORMAL LOW
Glucose, Bld: 86 mg/dL (ref 70–99)
Potassium: 4.4 mEq/L (ref 3.5–5.3)
Sodium: 138 mEq/L (ref 135–145)

## 2013-06-05 LAB — FECAL LACTOFERRIN, QUANT: Lactoferrin: NEGATIVE

## 2013-06-05 LAB — C-REACTIVE PROTEIN: CRP: <0.5 mg/dL (ref <0.60)

## 2013-06-05 LAB — CLOSTRIDIUM DIFFICILE EIA: CDIFTX: NEGATIVE

## 2013-06-05 LAB — CBC
Hemoglobin: 13 g/dL (ref 12.0–15.0)
MCH: 29.5 pg (ref 26.0–34.0)
MCHC: 33.3 g/dL (ref 30.0–36.0)
Platelets: 316 10*3/uL (ref 150–400)
RBC: 4.4 MIL/uL (ref 3.87–5.11)

## 2013-06-07 ENCOUNTER — Other Ambulatory Visit: Payer: Self-pay | Admitting: Internal Medicine

## 2013-06-07 LAB — FECAL FAT QUALITATIVE: Free Fatty Acids: NORMAL

## 2013-06-07 LAB — OSMOLALITY, STOOL

## 2013-06-07 LAB — GIARDIA/CRYPTOSPORIDIUM (EIA): Cryptosporidium Screen (EIA): NEGATIVE

## 2013-06-08 ENCOUNTER — Telehealth: Payer: Self-pay | Admitting: *Deleted

## 2013-06-08 DIAGNOSIS — M79651 Pain in right thigh: Secondary | ICD-10-CM

## 2013-06-08 NOTE — Telephone Encounter (Signed)
Received PA request from pt's pharmacy.  Ondansetron is a tier 2 non-preferred.  Insurance also stated that medication should be ran though pt's Part B plan vs her part D plan.  Information was given to pharmacy who instructed me to call back in a couple of hours to see if this worked.Christine Spittle Cassady7/22/20141:17 PM

## 2013-06-08 NOTE — Progress Notes (Signed)
I saw and evaluated the patient.  I personally confirmed the key portions of the history and exam documented by Dr. Cater and I reviewed pertinent patient test results.  The assessment, diagnosis, and plan were formulated together and I agree with the documentation in the resident's note. 

## 2013-06-09 ENCOUNTER — Other Ambulatory Visit: Payer: Self-pay | Admitting: *Deleted

## 2013-06-09 MED ORDER — PROMETHAZINE HCL 12.5 MG PO TABS: 12.5000 mg | ORAL_TABLET | Freq: Four times a day (QID) | ORAL | Status: DC | PRN

## 2013-06-09 NOTE — Telephone Encounter (Signed)
A user error has taken place: encounter opened in error, closed for administrative reasons.[name], Christine Cassady7/23/201411:22 AM  .

## 2013-06-09 NOTE — Telephone Encounter (Signed)
I have placed a new order for Promethazine and d/c'ed the order for Zofran.  Vivi Barrack, MD

## 2013-06-09 NOTE — Telephone Encounter (Signed)
Follow call made to pharmacy, medication still needs a PA under the Part B plan.. Pharmacist did recommend changing med to promethazine, although it will still need a PA, but will be less expensive to the pt.  Will forward to ordering MD to see if ondansetron can be changed to promethazine.  Order for ondansetron will need to be removed and new order placed.Please advise.Criss Alvine, Lamari Youngers Cassady7/23/201411:25 AM

## 2013-06-11 ENCOUNTER — Ambulatory Visit (INDEPENDENT_AMBULATORY_CARE_PROVIDER_SITE_OTHER): Payer: Medicare Other | Admitting: Internal Medicine

## 2013-06-11 ENCOUNTER — Encounter: Payer: Self-pay | Admitting: Internal Medicine

## 2013-06-11 ENCOUNTER — Encounter: Payer: Self-pay | Admitting: Gastroenterology

## 2013-06-11 VITALS — BP 137/58 | HR 54 | Temp 97.4°F | Ht 65.0 in | Wt 136.4 lb

## 2013-06-11 DIAGNOSIS — R197 Diarrhea, unspecified: Secondary | ICD-10-CM | POA: Diagnosis not present

## 2013-06-11 DIAGNOSIS — M79651 Pain in right thigh: Secondary | ICD-10-CM

## 2013-06-11 DIAGNOSIS — K219 Gastro-esophageal reflux disease without esophagitis: Secondary | ICD-10-CM

## 2013-06-11 DIAGNOSIS — I10 Essential (primary) hypertension: Secondary | ICD-10-CM | POA: Diagnosis not present

## 2013-06-11 DIAGNOSIS — E119 Type 2 diabetes mellitus without complications: Secondary | ICD-10-CM

## 2013-06-11 DIAGNOSIS — M79609 Pain in unspecified limb: Secondary | ICD-10-CM

## 2013-06-11 LAB — POCT GLYCOSYLATED HEMOGLOBIN (HGB A1C): Hemoglobin A1C: 6.4

## 2013-06-11 LAB — CK: Total CK: 84 U/L (ref 7–177)

## 2013-06-11 LAB — GLUCOSE, CAPILLARY: Glucose-Capillary: 105 mg/dL — ABNORMAL HIGH (ref 70–99)

## 2013-06-11 MED ORDER — SUCRALFATE 1 G PO TABS: 1.0000 g | ORAL_TABLET | Freq: Four times a day (QID) | ORAL | Status: DC

## 2013-06-11 NOTE — Assessment & Plan Note (Addendum)
Patient continues to report successful treatment of her GERD symptoms on PPIs and H2 blockers, however she is unable to tolerate these medications at all due to diarrhea. Since completely stopping Zantac, she has noted improvement in her loose stools but return of her postprandial discomfort and cough consistent with acid reflux. - Referral to GI given refractoriness. - Will try sucralfate 1 g four times a day. - As this medication can interfere with the absorption of her other medications if not taken correctly, including Synthroid, we will follow her closely. - Return to the clinic in 2 weeks.

## 2013-06-11 NOTE — Assessment & Plan Note (Addendum)
Lab Results  Component Value Date   HGBA1C 6.4 06/11/2013   HGBA1C 6.3 01/11/2013   HGBA1C 6.3 09/18/2012     Assessment: Diabetes control:  Good Progress toward A1C goal:   At goal  Plan: Medications:  continue current medications. Denies signs or symptoms of hypoglycemia. Instruction/counseling given: reminded to bring medications to each visit

## 2013-06-11 NOTE — Assessment & Plan Note (Addendum)
Patient with continued right thigh pain, localized to the quadriceps, worse with walking and stair climbing. No inciting injury. The patient notes improvement in pain on nighttime lidocaine patches and tramadol, though the patient did experience some nausea with tramadol 50 mg.   Since her ESR and CRP are within normal limits, I am less suspicious of a rheumatologic cause.   The patient is on Lipitor 40 mg, so statin myopathy is also a possibility. However, she has been on this medication for several years without muscle pains. Also the unilateral nature of her pain is less consistent with statin myopathy (however she does note the pain was occasionally bilateral when it started last week).   Referred pain from a hip or knee pathology such as osteoarthritis is also possible, though she notes no joint tenderness, crepitation, range of motion deficits in her right hip or knee.  - Suggested the patient take a drug holiday from her Lipitor for 2 weeks - Checking a CK today - Continue lidocaine patches and tramadol for pain relief. I recommended the patient cut her tramadol in half (25mg ) or even in quarters (12.5mg ) and take with promethazine to reduce nausea. - Followup in 2 weeks. Consider x-ray at that time to evaluate for hip or knee arthritis.  ADDENDUM: CK 84 (WNL)

## 2013-06-11 NOTE — Progress Notes (Signed)
  Subjective:    [name], female    DOB: 01/24/1934, 79 y.o.   MRN: [id number]  HPI [name] is a 79 y.o. female with history of PMR, DM2, HTN, HLD, CAD, hypothyroidism, anemia and GERD presenting to the clinic for follow up for loose stools.  She notes that her malodorous, loose stools have resolved. The patient has a history of developing loose stools after starting new medications for her GERD, including various PPIs and H2 blockers, so she attributes her improvement to no longer taking Zantac. Of note, lab work from last week was unremarkable. CBC shows no signs of anemia. CMP shows no suggestion of electrolyte disturbance. Stool studies were unremarkable: Negative Giardia screen, Negative Cryptosporidium screen, fecal fat studies normal, Negative lactoferrin, C. difficile toxin negative. She denies abdominal pain, vomiting, blood in her stool, or dark stool. She still has her HOCT cards at home but has not used them yet.  Since stopping her Zantac, she has noted a return of postprandial discomfort and cough consistent with GERD symptoms she has had in the past. She would like to try another GERD medication, however she has failed Nexium, Protonix, dexlansoprazole, and ranitidine secondary to diarrhea.  She continues to have soreness in her right quadriceps, worse with activities that engage the muscle such as climbing stairs. She notes improvement with lidocaine patches and tramadol 50mg  at night, however the tramadol did make her nauseous even with concomitant use of promethazine. Since she has a history of PMR, ESR and CRP were checked last visit but were within normal limits.  She's due for recheck of her hemoglobin A1c. Last value was 6.3 in February 2014.   Review of Systems  Constitutional: Negative for fever and chills.  Respiratory: Positive for cough (Postprandial, like GERD symptoms she has had in the past). Negative for shortness of breath and wheezing.    Cardiovascular: Negative for chest pain.  Gastrointestinal: Positive for nausea. Negative for vomiting, abdominal pain, diarrhea and blood in stool.  Genitourinary: Negative for dysuria and frequency.  Musculoskeletal: Positive for myalgias.  Skin: Negative for rash.  Neurological: Negative for dizziness, syncope, light-headedness and numbness.       Objective:   Physical Exam  Constitutional: She is oriented to person, place, and time. She appears well-developed and well-nourished.  HENT:  Head: Normocephalic and atraumatic.  Mouth/Throat: Oropharynx is clear and moist.  Eyes: Pupils are equal, round, and reactive to light. No scleral icterus.  Neck: Normal range of motion. Neck supple. No thyromegaly present.  Cardiovascular: Normal rate, regular rhythm, normal heart sounds and intact distal pulses.  Exam reveals no gallop and no friction rub.   No murmur heard. Pulmonary/Chest: Effort normal and breath sounds normal.  Abdominal: Soft. Bowel sounds are normal. She exhibits no distension. There is no tenderness.  Musculoskeletal: Normal range of motion. She exhibits no edema and no tenderness.       Right hip: Normal. She exhibits normal range of motion, normal strength, no tenderness, no swelling and no crepitus.       Right knee: Normal. She exhibits normal range of motion, no swelling, no effusion and no deformity. No tenderness found.  No tenderness to palpation of the quadriceps muscle, knee joint, or hip joint.  Neurological: She is alert and oriented to person, place, and time. She displays normal reflexes.  Skin: Skin is warm and dry.          Assessment & Plan:

## 2013-06-11 NOTE — Progress Notes (Signed)
I saw patient and discussed her care with Dr. Cater at the time of the visit.  We reviewed the resident's history and exam and pertinent patient test results.  I agree with the assessment, diagnosis, and plan of care documented in the resident's note. 

## 2013-06-11 NOTE — Patient Instructions (Addendum)
Thank you for your visit.  For your RIGHT THIGH PAIN: - It is possible that your cholesterol medication, Lipitor, is contributing to your muscle pain. Please stop taking this medication for 2 weeks. We will followup with you at that time to see if your muscle pain has improved. - We are also checking a blood test today to see if your muscle pain is related to the Lipitor. - Please continue to use lidocaine patches at night for pain. - Try taking only half a pill of the Ultram with your antinausea medication for additional pain relief.  For your ACID REFLUX: - I am prescribing you a new medication, sucralfate, which you will take 4 times a day on an empty stomach. Please try this medication and see if it helps with your GERD symptoms. You may stop the medication if you develop diarrhea. - I am referring you to gastroenterology (stomach doctors) for further evaluation.  Please return to the clinic in 2 weeks.

## 2013-06-11 NOTE — Assessment & Plan Note (Signed)
BP Readings from Last 3 Encounters:  06/11/13 137/58  06/04/13 137/60  05/03/13 131/60    Lab Results  Component Value Date   NA 138 06/04/2013   K 4.4 06/04/2013   CREATININE 1.17* 06/04/2013    Assessment: Blood pressure control:  Good Progress toward BP goal:   At goal  Plan: Medications:  continue current medications

## 2013-06-11 NOTE — Assessment & Plan Note (Signed)
Resolved. All stool studies negative. Patient has not completed her HOCT cards, so I told her to keep them in case she once again develops very dark or black stools.

## 2013-06-18 ENCOUNTER — Other Ambulatory Visit: Payer: Self-pay | Admitting: *Deleted

## 2013-06-18 DIAGNOSIS — M79651 Pain in right thigh: Secondary | ICD-10-CM

## 2013-06-18 MED ORDER — LIDOCAINE 5 % EX PTCH: 1.0000 | MEDICATED_PATCH | CUTANEOUS | Status: DC

## 2013-06-25 ENCOUNTER — Ambulatory Visit (INDEPENDENT_AMBULATORY_CARE_PROVIDER_SITE_OTHER): Payer: Medicare Other | Admitting: Internal Medicine

## 2013-06-25 ENCOUNTER — Encounter: Payer: Self-pay | Admitting: Internal Medicine

## 2013-06-25 VITALS — BP 136/67 | HR 58 | Temp 97.6°F | Ht 65.0 in | Wt 137.6 lb

## 2013-06-25 DIAGNOSIS — M79609 Pain in unspecified limb: Secondary | ICD-10-CM | POA: Diagnosis not present

## 2013-06-25 DIAGNOSIS — R197 Diarrhea, unspecified: Secondary | ICD-10-CM | POA: Diagnosis not present

## 2013-06-25 DIAGNOSIS — M79651 Pain in right thigh: Secondary | ICD-10-CM

## 2013-06-25 DIAGNOSIS — M353 Polymyalgia rheumatica: Secondary | ICD-10-CM

## 2013-06-25 LAB — POC HEMOCCULT BLD/STL (HOME/3-CARD/SCREEN)
Card #3 Fecal Occult Blood, POC: NEGATIVE
Fecal Occult Blood, POC: NEGATIVE

## 2013-06-25 LAB — GLUCOSE, CAPILLARY: Glucose-Capillary: 96 mg/dL (ref 70–99)

## 2013-06-25 MED ORDER — PREDNISONE 5 MG PO TABS: 15.0000 mg | ORAL_TABLET | ORAL | Status: DC

## 2013-06-25 NOTE — Progress Notes (Signed)
[name], female   DOB: 01/24/1934, 79 y.o.   MRN: [id number]          HPI: Christine [name] is a 79 y.o. with previous medical history of hypothyroidism, diabetes, type II, dyslipidemia, hypertension, reported history of polymyalgia rheumatica, temporal arteritis, coronary artery disease, osteoporosis and osteoarthritis, Raynauld phenomenon, presents to the clinic for followup visit for thigh pain.  Patient was seen in the clinic on 06/11/2013 when she presented with right thigh pain. The pain was thought to be resulting from statin induced myopathy. High usual atorvastatin was held and several labs were ordered including a CK which came back at the level of 84, ESR of 9, C-reactive protein less than 0.5, a normal CBC and BMP. She was started on lidocaine patches and tramadol. She reports that since she left the clinic, the pain has persisted. She does have patches every 12 hours which seem to help with the pain. She puts them on before she goes to bed, which helps her sleep better. However, she reports nausea, associated with tramadol. But during the day time, the pain returns and to make her life miserable. She reports that the symptoms are very similar to what she experienced several years ago with polymyalgia rheumatica, and it responded excellently with prednisone. She denies history of fevers, chills, weight loss, fatigue, nausea, or vomiting. Her appetite has been excellent. She is unclear about whether she has stiffness. She has ongoing joint pains from degenerative joint disease. On direct questioning whether she feels her pain is in the muscles or bones, she reports that she feels is inside. The pain is dull and involves mainly the thighs bilaterally. She denies back pain. However, she also reports pain in her shoulders bilaterally. She also reports associated difficulty from getting off toilet seat.    Past Medical History  Diagnosis Date  . GERD (gastroesophageal reflux  disease)   . Hypertension   . Hypothyroidism   . Anemia   . Hyperlipidemia   . Sinus arrhythmia   . History of migraine headaches     Next atypical symptoms in the past. Patient was started on Neurontin for possible neuropathic origin of her pain.  Marland Kitchen COPD (chronic obstructive pulmonary disease)     PFTs 2011 show FEV1/FVC of 96%  . Heart murmur, systolic     2-D echo in December 2011 showed a normal EF with grade 1 diastolic dysfunction, trivial pulmonary regurgitation and mildly elevated PA pressure at 37 mmHg probably secondary to her COPD.dynamic obstruction-mid cavity obliteration,   . CAD (coronary artery disease)     LHC 08/23/11: dLM 40-50%, oRI 40%, oCFX 40%, oD1 70% (small and not amenable to PCI).  LM lesion did not appear to be flow limiting.  Medical rx was recommended.  Echo 08/23/11: mild LVH, EF 60-65%, grade 1 diast dysfxn, mild BAE, PASP 24.    . Carotid stenosis     dopplers 10/12:  0-39% bilat ICA (repeat 08/2012)  . Migraines   . Heart murmur   . Angina   . Chronic bronchitis   . Shortness of breath on exertion   . Type II diabetes mellitus     On oral hypoglycemic agents.  . Blood transfusion   . History of stomach ulcers 1970's  . Arthritis   . Polymyalgia rheumatica   . Diverticulosis of colon (without mention of hemorrhage)    Current Outpatient Prescriptions  Medication Sig Dispense Refill  . acetaminophen (TYLENOL) 500 MG tablet Take  1,000 mg by mouth every 6 (six) hours as needed. For pain      . ADVAIR DISKUS 250-50 MCG/DOSE AEPB inhale 1 dose by mouth every 12 hours  60 each  2  . atorvastatin (LIPITOR) 40 MG tablet Take 1 tablet (40 mg total) by mouth daily.  90 tablet  3  . clopidogrel (PLAVIX) 75 MG tablet take 1 tablet by mouth once daily  30 tablet  5  . DIOVAN 160 MG tablet take 1 tablet by mouth once daily  90 tablet  4  . glimepiride (AMARYL) 1 MG tablet take 1 tablet by mouth once daily before BREAKFAST  30 tablet  3  . guaiFENesin-codeine  (ROBITUSSIN AC) 100-10 MG/5ML syrup Take 5 mLs by mouth 4 (four) times daily as needed for cough.  120 mL  1  . imipramine (TOFRANIL) 50 MG tablet Take 1 tablet (50 mg total) by mouth 2 (two) times daily.  60 tablet  3  . isosorbide mononitrate (IMDUR) 60 MG 24 hr tablet 2 tablets (total 120mg ) daily  60 tablet  3  . levothyroxine (SYNTHROID, LEVOTHROID) 25 MCG tablet take 1 tablet by mouth once daily  90 tablet  1  . lidocaine (LIDODERM) 5 % Place 1 patch onto the skin daily. Remove & discard patch within 12 hours or as directed by MD  10 patch  0  . loratadine (CLARITIN) 10 MG tablet Take 1 tablet (10 mg total) by mouth daily as needed. For allergies  30 tablet  5  . metoprolol succinate (TOPROL-XL) 100 MG 24 hr tablet Take 1 tablet (100 mg total) by mouth daily. Take with or immediately following a meal.  30 tablet  11  . Multiple Vitamin (MULITIVITAMIN WITH MINERALS) TABS Take 1 tablet by mouth daily.      . nitroGLYCERIN (NITROSTAT) 0.4 MG SL tablet Place 1 tablet (0.4 mg total) under the tongue every 5 (five) minutes as needed for chest pain. For chest pain  25 tablet  6  . ONE TOUCH ULTRA TEST test strip use as directed  100 each  12  . ONETOUCH DELICA LANCETS 33G MISC use as directed  100 each  6  . promethazine (PHENERGAN) 12.5 MG tablet Take 1 tablet (12.5 mg total) by mouth every 6 (six) hours as needed for nausea (Can take 1-2 tablets at a time).  30 tablet  0  . ranitidine (ZANTAC 75) 75 MG tablet Take 1 tablet (75 mg total) by mouth at bedtime.  30 tablet  2  . sucralfate (CARAFATE) 1 G tablet Take 1 tablet (1 g total) by mouth 4 (four) times daily.  120 tablet  1  . traMADol (ULTRAM) 50 MG tablet Take 1 tablet (50 mg total) by mouth every 8 (eight) hours as needed for pain.  20 tablet  0  . VENTOLIN HFA 108 (90 BASE) MCG/ACT inhaler inhale 2 puffs by mouth every 4 to 6 hours if needed for shortness of breath  18 g  3  . vitamin C (ASCORBIC ACID) 500 MG tablet Take 500 mg by mouth  daily.      . predniSONE (DELTASONE) 5 MG tablet Take 3 tablets (15 mg total) by mouth every morning.  21 tablet  0   No current facility-administered medications for this visit.   Family History  Problem Relation Age of Onset  . Colon cancer Mother 35  . COPD Father 78   History   Social History  . Marital Status: Widowed  Spouse Name: N/A    Number of Children: N/A  . Years of Education: N/A   Social History Main Topics  . Smoking status: Never Smoker   . Smokeless tobacco: Never Used  . Alcohol Use: No     Comment: "Last drink 1967"  . Drug Use: No  . Sexually Active: None   Other Topics Concern  . None   Social History Narrative    Never smoked. Care for her daughter who has had a lung transplant. 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  Respiratory: Denies SOB, DOE, cough, chest tightness, and wheezing.  Cardiovascular: No chest pain, palpitations and leg swelling.  Gastrointestinal: No abdominal pain, nausea, vomiting, bloody stools Genitourinary: No dysuria, frequency, hematuria, or flank pain.   Objective:  Physical Exam: Filed Vitals:   06/25/13 1002  BP: 136/67  Pulse: 58  Temp: 97.6 F (36.4 C)  TempSrc: Oral  Height: 5\' 5"  (1.651 m)  Weight: 137 lb 9.6 oz (62.415 kg)  SpO2: 99%   General: Well nourished. No acute distress.  Lungs: CTA bilaterally. Heart: RRR; no extra sounds or murmurs  Abdomen: Non-distended, normal BS, soft, nontender; no hepatosplenomegaly  Extremities: No pedal edema. Good pulses. Mild tenderness on the palpation of the left knee. No muscle tenderness on palpation of the quadriceps and the cuff muscles. Internal and external rotation of her hips bilaterally elicits some tenderness but mild. She does not have neurological deficits, and she has good strength in both extremities. She was  obviously having difficulties when standing from a seated position.  Negative straight leg raising sign. Neurologic: Alert and oriented x3. No obvious neurologic deficits.  Assessment & Plan:  I have discussed my assessment and plan  with Dr. Meredith Pel as detailed under problem based charting.

## 2013-06-25 NOTE — Progress Notes (Signed)
Pt requested to delay foot exam until her next visit.

## 2013-06-25 NOTE — Patient Instructions (Addendum)
You results from last visit well all normal  Please start taking prednisone 15 mg once daily for one week and we see how you respond Please come back in one week to see how you are doing    Treatment Goals:  Goals (1 Years of Data) as of 06/25/13   None      Progress Toward Treatment Goals:  Treatment Goal 06/25/2013  Hemoglobin A1C at goal  Blood pressure at goal    Self Care Goals & Plans:  Self Care Goal 02/15/2013  Manage my medications take my medicines as prescribed; bring my medications to every visit; refill my medications on time  Monitor my health bring my glucose meter and log to each visit  Eat healthy foods drink diet soda or water instead of juice or soda; eat more vegetables; eat foods that are low in salt    Home Blood Glucose Monitoring 06/25/2013  Check my blood sugar 3 times a day     Care Management & Community Referrals:

## 2013-06-25 NOTE — Assessment & Plan Note (Signed)
Clinical history and physical exam is not very consistent with a flare of her PMR. She has a history of temporal arteritis, which also responded to steroids. Laboratory findings do not indicate an acute inflammatory process with a normal ESR and CRP. The highest level of ESR on review of her chart is 39. Another possibility is statin and induced myopathy, even though her CK was not elevated. However, given the patient's history with good response to treatment with steroids, I discussed the case with Dr. Meredith Pel, and we both felt that she might benefit from a steroid. Typically, PMR quickly responds to steroids. I have prescribed for her prednisone 15 mg once daily for the next 7 days. I will reevaluate her in one week. With PMR I expect excellent response. I do not feel particularly compelled to refer the patient to rheumatologist at this time.

## 2013-06-29 ENCOUNTER — Other Ambulatory Visit: Payer: Self-pay | Admitting: *Deleted

## 2013-06-29 MED ORDER — CLOPIDOGREL BISULFATE 75 MG PO TABS: 75.0000 mg | ORAL_TABLET | Freq: Every day | ORAL | Status: DC

## 2013-06-29 MED ORDER — GLIMEPIRIDE 1 MG PO TABS: 1.0000 mg | ORAL_TABLET | Freq: Every day | ORAL | Status: DC

## 2013-06-30 ENCOUNTER — Other Ambulatory Visit: Payer: Self-pay | Admitting: Internal Medicine

## 2013-07-01 ENCOUNTER — Ambulatory Visit (INDEPENDENT_AMBULATORY_CARE_PROVIDER_SITE_OTHER): Payer: Medicare Other | Admitting: Gastroenterology

## 2013-07-01 ENCOUNTER — Encounter: Payer: Self-pay | Admitting: Gastroenterology

## 2013-07-01 VITALS — BP 158/62 | HR 68 | Ht 65.0 in | Wt 137.0 lb

## 2013-07-01 DIAGNOSIS — K219 Gastro-esophageal reflux disease without esophagitis: Secondary | ICD-10-CM | POA: Diagnosis not present

## 2013-07-01 DIAGNOSIS — K5730 Diverticulosis of large intestine without perforation or abscess without bleeding: Secondary | ICD-10-CM

## 2013-07-01 MED ORDER — ESOMEPRAZOLE MAGNESIUM 40 MG PO CPDR: 40.0000 mg | DELAYED_RELEASE_CAPSULE | Freq: Every day | ORAL | Status: DC

## 2013-07-01 NOTE — Patient Instructions (Signed)
We have sent the following medications to your pharmacy for you to pick up at your convenience: Nexium 40 mg, please take one capsule by mouth thirty minutes before breakfast  Take your Plavix at bedtime   Please follow up with Dr. Jarold Motto in one year or sooner if symptoms reoccur

## 2013-07-01 NOTE — Progress Notes (Signed)
This is a 79 year old African American female with coronary artery disease on Plavix therapy.  She also has chronic GERD which is been evaluated previously by GI.  She is completely asymptomatic on Nexium, but this was discontinued by cardiology because of fear of interaction with Plavix.  She was placed on Zantac with subsequent watery diarrhea which is now resolved since discontinuation of Zantac.  However, she continues with regurgitation and burning substernal chest pain without dysphagia.  His had thorough stool exams which are been unremarkable.  She is up-to-date on her colonoscopy exams last done in 2009 which did show some diverticulosis but no colon polyps..  Stool Hemoccults x3 are negative recently..  She denies current hepatobiliary complaints.  Current Medications, Allergies, Past Medical History, Past Surgical History, Family History and Social History were reviewed in [employer] record.  ROS: All systems were reviewed and are negative unless otherwise stated in the HPI.      Allergies  Allergen Reactions  . Aspirin Swelling and Other (See Comments)    REACTION: Angioedema  . Codeine Nausea And Vomiting    "makes me deathly sick"  . Penicillins Nausea And Vomiting    "makes me go to the bathroom real bad"   Outpatient Prescriptions Prior to Visit  Medication Sig Dispense Refill  . acetaminophen (TYLENOL) 500 MG tablet Take 1,000 mg by mouth every 6 (six) hours as needed. For pain      . ADVAIR DISKUS 250-50 MCG/DOSE AEPB inhale 1 dose by mouth every 12 hours  60 each  2  . atorvastatin (LIPITOR) 40 MG tablet Take 1 tablet (40 mg total) by mouth daily.  90 tablet  3  . clopidogrel (PLAVIX) 75 MG tablet Take 1 tablet (75 mg total) by mouth daily.  30 tablet  5  . DIOVAN 160 MG tablet take 1 tablet by mouth once daily  90 tablet  4  . glimepiride (AMARYL) 1 MG tablet Take 1 tablet (1 mg total) by mouth daily before breakfast.  30 tablet  3  .  guaiFENesin-codeine (ROBITUSSIN AC) 100-10 MG/5ML syrup Take 5 mLs by mouth 4 (four) times daily as needed for cough.  120 mL  1  . imipramine (TOFRANIL) 50 MG tablet Take 1 tablet (50 mg total) by mouth 2 (two) times daily.  60 tablet  3  . isosorbide mononitrate (IMDUR) 60 MG 24 hr tablet 2 tablets (total 120mg ) daily  60 tablet  3  . levothyroxine (SYNTHROID, LEVOTHROID) 25 MCG tablet take 1 tablet by mouth once daily  90 tablet  1  . lidocaine (LIDODERM) 5 % Place 1 patch onto the skin daily. Remove & discard patch within 12 hours or as directed by MD  10 patch  0  . loratadine (CLARITIN) 10 MG tablet Take 1 tablet (10 mg total) by mouth daily as needed. For allergies  30 tablet  5  . metoprolol succinate (TOPROL-XL) 100 MG 24 hr tablet Take 1 tablet (100 mg total) by mouth daily. Take with or immediately following a meal.  30 tablet  11  . Multiple Vitamin (MULITIVITAMIN WITH MINERALS) TABS Take 1 tablet by mouth daily.      . nitroGLYCERIN (NITROSTAT) 0.4 MG SL tablet Place 1 tablet (0.4 mg total) under the tongue every 5 (five) minutes as needed for chest pain. For chest pain  25 tablet  6  . ONE TOUCH ULTRA TEST test strip use as directed  100 each  12  . [facility] DELICA  LANCETS 33G MISC use as directed  100 each  6  . predniSONE (DELTASONE) 5 MG tablet Take 3 tablets (15 mg total) by mouth every morning.  21 tablet  0  . promethazine (PHENERGAN) 12.5 MG tablet Take 1 tablet (12.5 mg total) by mouth every 6 (six) hours as needed for nausea (Can take 1-2 tablets at a time).  30 tablet  0  . ranitidine (ZANTAC 75) 75 MG tablet Take 1 tablet (75 mg total) by mouth at bedtime.  30 tablet  2  . traMADol (ULTRAM) 50 MG tablet Take 1 tablet (50 mg total) by mouth every 8 (eight) hours as needed for pain.  20 tablet  0  . VENTOLIN HFA 108 (90 BASE) MCG/ACT inhaler inhale 2 puffs by mouth every 4 to 6 hours if needed for shortness of breath  18 g  3  . vitamin C (ASCORBIC ACID) 500 MG tablet Take 500  mg by mouth daily.      . sucralfate (CARAFATE) 1 G tablet Take 1 tablet (1 g total) by mouth 4 (four) times daily.  120 tablet  1   No facility-administered medications prior to visit.   Past Medical History  Diagnosis Date  . GERD (gastroesophageal reflux disease)   . Hypertension   . Hypothyroidism   . Anemia   . Hyperlipidemia   . Sinus arrhythmia   . History of migraine headaches     Next atypical symptoms in the past. Patient was started on Neurontin for possible neuropathic origin of her pain.  Marland Kitchen COPD (chronic obstructive pulmonary disease)     PFTs 2011 show FEV1/FVC of 96%  . Heart murmur, systolic     2-D echo in December 2011 showed a normal EF with grade 1 diastolic dysfunction, trivial pulmonary regurgitation and mildly elevated PA pressure at 37 mmHg probably secondary to her COPD.dynamic obstruction-mid cavity obliteration,   . CAD (coronary artery disease)     LHC 08/23/11: dLM 40-50%, oRI 40%, oCFX 40%, oD1 70% (small and not amenable to PCI).  LM lesion did not appear to be flow limiting.  Medical rx was recommended.  Echo 08/23/11: mild LVH, EF 60-65%, grade 1 diast dysfxn, mild BAE, PASP 24.    . Carotid stenosis     dopplers 10/12:  0-39% bilat ICA (repeat 08/2012)  . Migraines   . Heart murmur   . Angina   . Chronic bronchitis   . Shortness of breath on exertion   . Type II diabetes mellitus     On oral hypoglycemic agents.  . Blood transfusion   . History of stomach ulcers 1970's  . Arthritis   . Polymyalgia rheumatica   . Diverticulosis of colon (without mention of hemorrhage)    Past Surgical History  Procedure Laterality Date  . Vaginal hysterectomy  1976     total abdominal hysterectomy for reasons unknown  . Cataract extraction w/ intraocular lens  implant, bilateral  1990's   History   Social History  . Marital Status: Widowed    Spouse Name: N/A    Number of Children: N/A  . Years of Education: N/A   Social History Main Topics  . Smoking  status: Never Smoker   . Smokeless tobacco: Never Used  . Alcohol Use: No     Comment: "Last drink 1967"  . Drug Use: No  . Sexual Activity: None   Other Topics Concern  . None   Social History Narrative    Never smoked. Care  for her daughter who has had a lung transplant. 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 Colon cancer Mother 49  . COPD Father 26          Physical Exam: Awake and alert no acute distress.  Low pressure 150/62, pulse 68, and weight 137 with a BMI of 22.8.  I cannot appreciate stigmata of chronic liver disease.  Her chest is clear and today she appear to be in a regular rhythm without murmurs gallops or rubs.  Her abdomen shows no distention, organomegaly masses or tenderness.  Bowel sounds are normal.  Rectal exam was deferred.  Mental status was normal.    Assessment and Plan: GERD well-controlled with daily Nexium.  The AGA and other GI societies have shown that there is no risk of metabolic interaction with Plavix and Nexium.  The risk does exist slightly with omeprazole.  I have restarted Nexium 40 mg in the morning with Plavix in the evening.  All of her lower GI complaints have abated since she's been off of Zantac therapy, and I do not think further GI evaluation needed at this time.  She is to continue other medications as listed and reviewed.

## 2013-07-01 NOTE — Telephone Encounter (Signed)
Just filled these 8/12.  Does she need them at another pharmacy?   thanks

## 2013-07-02 ENCOUNTER — Ambulatory Visit (INDEPENDENT_AMBULATORY_CARE_PROVIDER_SITE_OTHER): Payer: Medicare Other | Admitting: Internal Medicine

## 2013-07-02 ENCOUNTER — Encounter: Payer: Self-pay | Admitting: Internal Medicine

## 2013-07-02 VITALS — BP 109/62 | HR 69 | Temp 97.2°F | Ht 65.0 in | Wt 137.3 lb

## 2013-07-02 DIAGNOSIS — M79651 Pain in right thigh: Secondary | ICD-10-CM

## 2013-07-02 DIAGNOSIS — M353 Polymyalgia rheumatica: Secondary | ICD-10-CM

## 2013-07-02 DIAGNOSIS — M79609 Pain in unspecified limb: Secondary | ICD-10-CM

## 2013-07-02 MED ORDER — PREDNISONE 5 MG PO TABS: 15.0000 mg | ORAL_TABLET | ORAL | Status: DC

## 2013-07-02 NOTE — Assessment & Plan Note (Signed)
Patient has markedly improved. Likely this was PMR. I have prescribed a tapering does of prednisone over the next 3 weeks. Patient is familiar with this tapering steroid regimen since she has been treated in a similar manner previously. I encouraged her to followup with her clinic if her pain recurs.

## 2013-07-02 NOTE — Progress Notes (Signed)
[name], female   DOB: 01/24/1934, 79 y.o.   MRN: [id number]   Subjective:   HPI: [name] is a 79 y.o. with past medical history listed below presents to the clinic for followup of her bilateral lower extremity pains.  I evaluated the patient last week and started her on a prednisone for treatment of presumed PMR flare. Patient reports that she has done remarkably well on this medication. She is now able to ambulate without much pain. She has no new complaints. She had her last by mouth prednisone 15 mg this morning.   Past Medical History  Diagnosis Date  . GERD (gastroesophageal reflux disease)   . Hypertension   . Hypothyroidism   . Anemia   . Hyperlipidemia   . Sinus arrhythmia   . History of migraine headaches     Next atypical symptoms in the past. Patient was started on Neurontin for possible neuropathic origin of her pain.  Marland Kitchen COPD (chronic obstructive pulmonary disease)     PFTs 2011 show FEV1/FVC of 96%  . Heart murmur, systolic     2-D echo in December 2011 showed a normal EF with grade 1 diastolic dysfunction, trivial pulmonary regurgitation and mildly elevated PA pressure at 37 mmHg probably secondary to her COPD.dynamic obstruction-mid cavity obliteration,   . CAD (coronary artery disease)     LHC 08/23/11: dLM 40-50%, oRI 40%, oCFX 40%, oD1 70% (small and not amenable to PCI).  LM lesion did not appear to be flow limiting.  Medical rx was recommended.  Echo 08/23/11: mild LVH, EF 60-65%, grade 1 diast dysfxn, mild BAE, PASP 24.    . Carotid stenosis     dopplers 10/12:  0-39% bilat ICA (repeat 08/2012)  . Migraines   . Heart murmur   . Angina   . Chronic bronchitis   . Shortness of breath on exertion   . Type II diabetes mellitus     On oral hypoglycemic agents.  . Blood transfusion   . History of stomach ulcers 1970's  . Arthritis   . Polymyalgia rheumatica   . Diverticulosis of colon (without mention of hemorrhage)    Current  Outpatient Prescriptions  Medication Sig Dispense Refill  . acetaminophen (TYLENOL) 500 MG tablet Take 1,000 mg by mouth every 6 (six) hours as needed. For pain      . ADVAIR DISKUS 250-50 MCG/DOSE AEPB inhale 1 dose by mouth every 12 hours  60 each  2  . atorvastatin (LIPITOR) 40 MG tablet Take 1 tablet (40 mg total) by mouth daily.  90 tablet  3  . clopidogrel (PLAVIX) 75 MG tablet Take 1 tablet (75 mg total) by mouth daily.  30 tablet  5  . DIOVAN 160 MG tablet take 1 tablet by mouth once daily  90 tablet  4  . esomeprazole (NEXIUM) 40 MG capsule Take 1 capsule (40 mg total) by mouth daily before breakfast.  30 capsule  7  . glimepiride (AMARYL) 1 MG tablet Take 1 tablet (1 mg total) by mouth daily before breakfast.  30 tablet  3  . guaiFENesin-codeine (ROBITUSSIN AC) 100-10 MG/5ML syrup Take 5 mLs by mouth 4 (four) times daily as needed for cough.  120 mL  1  . imipramine (TOFRANIL) 50 MG tablet Take 1 tablet (50 mg total) by mouth 2 (two) times daily.  60 tablet  3  . isosorbide mononitrate (IMDUR) 60 MG 24 hr tablet 2 tablets (total 120mg ) daily  60 tablet  3  . levothyroxine (SYNTHROID, LEVOTHROID) 25 MCG tablet take 1 tablet by mouth once daily  90 tablet  1  . lidocaine (LIDODERM) 5 % Place 1 patch onto the skin daily. Remove & discard patch within 12 hours or as directed by MD  10 patch  0  . loratadine (CLARITIN) 10 MG tablet Take 1 tablet (10 mg total) by mouth daily as needed. For allergies  30 tablet  5  . metoprolol succinate (TOPROL-XL) 100 MG 24 hr tablet Take 1 tablet (100 mg total) by mouth daily. Take with or immediately following a meal.  30 tablet  11  . Multiple Vitamin (MULITIVITAMIN WITH MINERALS) TABS Take 1 tablet by mouth daily.      . nitroGLYCERIN (NITROSTAT) 0.4 MG SL tablet Place 1 tablet (0.4 mg total) under the tongue every 5 (five) minutes as needed for chest pain. For chest pain  25 tablet  6  . ONE TOUCH ULTRA TEST test strip use as directed  100 each  12  .  ONETOUCH DELICA LANCETS 33G MISC use as directed  100 each  6  . predniSONE (DELTASONE) 5 MG tablet Take 3 tablets (15 mg total) by mouth as directed. Please take 15 mg every morning for 1 more week starting from today 07/02/2013 to 07/08/2013 Then take 10 mg every morning starting from 07/09/2013 to 07/16/2013 Then take 5 mg every morning from 07/17/2013 to 07/23/2013 and STOP  42 tablet  0  . promethazine (PHENERGAN) 12.5 MG tablet Take 1 tablet (12.5 mg total) by mouth every 6 (six) hours as needed for nausea (Can take 1-2 tablets at a time).  30 tablet  0  . ranitidine (ZANTAC 75) 75 MG tablet Take 1 tablet (75 mg total) by mouth at bedtime.  30 tablet  2  . traMADol (ULTRAM) 50 MG tablet Take 1 tablet (50 mg total) by mouth every 8 (eight) hours as needed for pain.  20 tablet  0  . VENTOLIN HFA 108 (90 BASE) MCG/ACT inhaler inhale 2 puffs by mouth every 4 to 6 hours if needed for shortness of breath  18 g  3  . vitamin C (ASCORBIC ACID) 500 MG tablet Take 500 mg by mouth daily.       No current facility-administered medications for this visit.   Family History  Problem Relation Age of Onset  . Colon cancer Mother 28  . COPD Father 40   History   Social History  . Marital Status: Widowed    Spouse Name: N/A    Number of Children: N/A  . Years of Education: N/A   Social History Main Topics  . Smoking status: Never Smoker   . Smokeless tobacco: Never Used  . Alcohol Use: No     Comment: "Last drink 1967"  . Drug Use: No  . Sexual Activity: None   Other Topics Concern  . None   Social History Narrative    Never smoked. 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  Respiratory: Denies SOB, DOE, cough, chest tightness, and wheezing.  Cardiovascular: No chest pain, palpitations and leg swelling.    Gastrointestinal: No abdominal pain, nausea, vomiting, bloody stools Genitourinary: No dysuria, frequency, hematuria, or flank pain.  Musculoskeletal: No myalgias, back pain, joint swelling, arthralgias    Objective:  Physical Exam: Filed Vitals:   07/02/13 0950  BP: 109/62  Pulse: 69  Temp: 97.2 F (36.2 C)  TempSrc: Oral  Height: 5\' 5"  (1.651 m)  Weight: 137 lb 4.8 oz (62.279 kg)  SpO2: 100%   General: Well nourished. No acute distress.  Lungs: CTA bilaterally. Heart: RRR; no extra sounds or murmurs  Abdomen: Non-distended, normal BS, soft, nontender; no hepatosplenomegaly  Extremities: No pedal edema. No joint swelling or tenderness. Dramatic improvement compared to her last physical exam. Neurologic: Alert and oriented x3. No obvious neurologic deficits.  Assessment & Plan:  I have discussed my assessment and plan  with Dr. Meredith Pel as detailed under problem based charting.

## 2013-07-02 NOTE — Patient Instructions (Addendum)
I am glad that the prednisone has help your pain  We will slowly cut back this medication as instructed here:  Please take 15 mg every morning for 1 more week starting from today 07/02/2013 to 07/08/2013 Then take 10 mg every morning starting from 07/09/2013 to 07/16/2013 Then take 5 mg every morning from 07/17/2013 to 07/23/2013 and STOP If your symptoms return, please come back to the clinic

## 2013-07-08 ENCOUNTER — Other Ambulatory Visit: Payer: Self-pay | Admitting: Internal Medicine

## 2013-07-26 ENCOUNTER — Encounter: Payer: Medicare Other | Admitting: Internal Medicine

## 2013-08-16 ENCOUNTER — Other Ambulatory Visit: Payer: Self-pay | Admitting: Cardiology

## 2013-09-07 ENCOUNTER — Ambulatory Visit (INDEPENDENT_AMBULATORY_CARE_PROVIDER_SITE_OTHER): Payer: Medicare Other | Admitting: Cardiology

## 2013-09-07 ENCOUNTER — Encounter: Payer: Self-pay | Admitting: Cardiology

## 2013-09-07 VITALS — BP 132/72 | HR 71 | Ht 65.0 in | Wt 135.0 lb

## 2013-09-07 DIAGNOSIS — I2510 Atherosclerotic heart disease of native coronary artery without angina pectoris: Secondary | ICD-10-CM | POA: Diagnosis not present

## 2013-09-07 DIAGNOSIS — E785 Hyperlipidemia, unspecified: Secondary | ICD-10-CM

## 2013-09-07 MED ORDER — ATORVASTATIN CALCIUM 40 MG PO TABS: 40.0000 mg | ORAL_TABLET | Freq: Every day | ORAL | Status: DC

## 2013-09-07 NOTE — Patient Instructions (Signed)
Start atorvastatin 40mg  daily for your cholesterol.  Your physician recommends that you return for a FASTING lipid profile in 2 months.   Your physician wants you to follow-up in: 6 months with Dr Shirlee Latch. (April 2015).  You will receive a reminder letter in the mail two months in advance. If you don't receive a letter, please call our office to schedule the follow-up appointment.

## 2013-09-07 NOTE — Progress Notes (Signed)
[name], female   DOB: 01/24/1934, 79 y.o.   MRN: [id number] PCP: Dr. Zada Girt  79 yo with history of CAD, HTN, and diabetes presents for cardiology followup.  I initially saw Christine [name] in the hospital in 10/12 with chest pain.  Left heart cath showed a 40-50% distal left main stenosis that was not thought to be flow-limiting.  She additionally had a 75% stenosis in a diagonal.  Chest pain resolved with medical therapy.  She was admitted again in 4/13 with chest pain and exertional dyspnea.  Cardiac enzymes were negative.  LHC looked similar to prior with 50% distal LM, 70% ostial D1, and 70% ostial ramus.  Echo showed preserved EF.  A dobutamine stress echo was done, this was submaximal but did not show evidence for ischemia.  No intervention, planned medical management.   Anti-anginal meds were titrated up and she was discharged home.  She developed chest pain (atypical) again earlier this year.  Lexiscan Cardiolite was normal in 3/14.    Lately, she has been doing well.  No dyspnea walking on flat ground, short of breath with stairs.  No chest pain.  Not very active.  She was taken off atorvastatin because of muscle pain, but it sounds like this was actually due to PMR.    ECG: NSR, rSR' V1, Qs V1/V2  Labs (2/13): K 3.3, creatinine 0.9 Labs (3/13): HDL 42, LDL 49 Labs (4/13): K 4.1, creatinine 1.01 Labs (10/13): LDL 56, HDL 38 Labs (2/14): K 3.8, creatinine 0.98 Labs (7/14): K 4.4, creatinine 1.17  PMH: 1. CAD: Chest pain 10/12.  LHC with 40-50% distal LM stenosis, 70-75% ostial D1.  No flow-limiting lesions, medically managed.  Echo (10/12): EF 60-65%, mild LV hypertrophy, PA systolic pressure 24 mmHg.  Chest pain 4/13.  LHC with 50% distal LM, 70% ostial D1, 50% proximal LAD, 70% proximal ramus.  No flow-limiting lesions, medically managed.  Echo (4/13) with EF 60-65%, mild LVH.  DSE (4/13) with no ischemia but it was a submaximal study.  Lexiscan Cardiolite (3/14) with EF 88%,  no ischemia or infarction.  2. DM2 3. HTN 4. Hyperlipidemia 5. CKD 6. Carotid dopplers (10/12): 0-39% bilateral carotid stenosis.  7. Allergy to aspirin => rash.  8. GERD 9. Migraines 10. Hypothyroidism 11. COPD 12. PMR  SH: Nonsmoker.  Lives in [place].   FH: CAD  Current Outpatient Prescriptions  Medication Sig Dispense Refill  . acetaminophen (TYLENOL) 500 MG tablet Take 1,000 mg by mouth every 6 (six) hours as needed. For pain      . ADVAIR DISKUS 250-50 MCG/DOSE AEPB inhale 1 dose by mouth every 12 hours  60 each  2  . atorvastatin (LIPITOR) 40 MG tablet Take 1 tablet (40 mg total) by mouth daily.  30 tablet  6  . clopidogrel (PLAVIX) 75 MG tablet Take 1 tablet (75 mg total) by mouth daily.  30 tablet  5  . DIOVAN 160 MG tablet take 1 tablet by mouth once daily  90 tablet  4  . esomeprazole (NEXIUM) 40 MG capsule Take 1 capsule (40 mg total) by mouth daily before breakfast.  30 capsule  7  . glimepiride (AMARYL) 1 MG tablet Take 1 tablet (1 mg total) by mouth daily before breakfast.  30 tablet  3  . guaiFENesin-codeine (ROBITUSSIN AC) 100-10 MG/5ML syrup Take 5 mLs by mouth 4 (four) times daily as needed for cough.  120 mL  1  . imipramine (TOFRANIL) 50 MG tablet  Take 1 tablet (50 mg total) by mouth 2 (two) times daily.  60 tablet  3  . isosorbide mononitrate (IMDUR) 60 MG 24 hr tablet take 2 tablets by mouth daily  60 tablet  1  . levothyroxine (SYNTHROID, LEVOTHROID) 25 MCG tablet take 1 tablet by mouth once daily  90 tablet  1  . lidocaine (LIDODERM) 5 % Place 1 patch onto the skin daily. Remove & discard patch within 12 hours or as directed by MD  10 patch  0  . loratadine (CLARITIN) 10 MG tablet Take 1 tablet (10 mg total) by mouth daily as needed. For allergies  30 tablet  5  . metoprolol succinate (TOPROL-XL) 100 MG 24 hr tablet Take 1 tablet (100 mg total) by mouth daily. Take with or immediately following a meal.  30 tablet  11  . Multiple Vitamin (MULITIVITAMIN WITH  MINERALS) TABS Take 1 tablet by mouth daily.      . nitroGLYCERIN (NITROSTAT) 0.4 MG SL tablet Place 1 tablet (0.4 mg total) under the tongue every 5 (five) minutes as needed for chest pain. For chest pain  25 tablet  6  . ONE TOUCH ULTRA TEST test strip use as directed  100 each  12  . ONETOUCH DELICA LANCETS 33G MISC use as directed  100 each  6  . predniSONE (DELTASONE) 5 MG tablet Take 3 tablets (15 mg total) by mouth as directed. Please take 15 mg every morning for 1 more week starting from today 07/02/2013 to 07/08/2013 Then take 10 mg every morning starting from 07/09/2013 to 07/16/2013 Then take 5 mg every morning from 07/17/2013 to 07/23/2013 and STOP  42 tablet  0  . PROAIR HFA 108 (90 BASE) MCG/ACT inhaler inhale 2 puffs by mouth every 4 to 6 hours if needed for shortness of breath  8.5 g  3  . promethazine (PHENERGAN) 12.5 MG tablet Take 1 tablet (12.5 mg total) by mouth every 6 (six) hours as needed for nausea (Can take 1-2 tablets at a time).  30 tablet  0  . ranitidine (ZANTAC 75) 75 MG tablet Take 1 tablet (75 mg total) by mouth at bedtime.  30 tablet  2  . traMADol (ULTRAM) 50 MG tablet Take 1 tablet (50 mg total) by mouth every 8 (eight) hours as needed for pain.  20 tablet  0   No current facility-administered medications for this visit.   BP 132/72  Pulse 71  Ht 5\' 5"  (1.651 m)  Wt 61.236 kg (135 lb)  BMI 22.47 kg/m2  LMP 02/10/1966 General: NAD Neck: No JVD, no thyromegaly or thyroid nodule.  Lungs: Clear to auscultation bilaterally with normal respiratory effort. CV: Nondisplaced PMI.  Heart regular S1/S2, no S3/S4, 2/6 early SEM.  No peripheral edema.  No carotid bruit.  1+ PT bilaterally.   Abdomen: Soft, nontender, no hepatosplenomegaly, no distention.  Neurologic: Alert and oriented x 3.  Psych: Normal affect. Extremities: No clubbing or cyanosis.   Assessment/Plan: 1. CAD: Non-flow limiting, moderate disease on cath in 4/13. No ischemia or infarction on Lexiscan  Cardiolite in 3/14.  No recent chest pain.  - Continue Plavix (allergic to aspirin), Toprol XL, valsartan, Imdur. - Restart statin, I think that the pain she was having was due to PMR (better with steroids).   2. Hyperlipidemia: Restart atorvastatin 40 mg daily with lipids in 2 months.   Marca Ancona 09/07/2013 11:32 AM

## 2013-09-10 ENCOUNTER — Other Ambulatory Visit: Payer: Self-pay | Admitting: Internal Medicine

## 2013-09-13 ENCOUNTER — Ambulatory Visit: Payer: Medicare Other | Admitting: Cardiology

## 2013-09-21 DIAGNOSIS — H04129 Dry eye syndrome of unspecified lacrimal gland: Secondary | ICD-10-CM | POA: Diagnosis not present

## 2013-09-21 DIAGNOSIS — H26499 Other secondary cataract, unspecified eye: Secondary | ICD-10-CM | POA: Diagnosis not present

## 2013-09-21 DIAGNOSIS — Z961 Presence of intraocular lens: Secondary | ICD-10-CM | POA: Diagnosis not present

## 2013-09-23 ENCOUNTER — Telehealth: Payer: Self-pay | Admitting: *Deleted

## 2013-09-23 ENCOUNTER — Encounter (HOSPITAL_COMMUNITY): Payer: Self-pay | Admitting: Emergency Medicine

## 2013-09-23 ENCOUNTER — Emergency Department (HOSPITAL_COMMUNITY): Payer: Medicare Other

## 2013-09-23 ENCOUNTER — Observation Stay (HOSPITAL_COMMUNITY)
Admission: EM | Admit: 2013-09-23 | Discharge: 2013-09-24 | Disposition: A | Discharge status: Home / Self Care | Payer: Medicare Other | Attending: Cardiology | Admitting: Cardiology

## 2013-09-23 DIAGNOSIS — M79601 Pain in right arm: Secondary | ICD-10-CM

## 2013-09-23 DIAGNOSIS — E039 Hypothyroidism, unspecified: Secondary | ICD-10-CM | POA: Diagnosis not present

## 2013-09-23 DIAGNOSIS — J441 Chronic obstructive pulmonary disease with (acute) exacerbation: Secondary | ICD-10-CM | POA: Insufficient documentation

## 2013-09-23 DIAGNOSIS — Z888 Allergy status to other drugs, medicaments and biological substances status: Secondary | ICD-10-CM | POA: Insufficient documentation

## 2013-09-23 DIAGNOSIS — K219 Gastro-esophageal reflux disease without esophagitis: Secondary | ICD-10-CM | POA: Diagnosis not present

## 2013-09-23 DIAGNOSIS — D649 Anemia, unspecified: Secondary | ICD-10-CM | POA: Diagnosis not present

## 2013-09-23 DIAGNOSIS — M353 Polymyalgia rheumatica: Secondary | ICD-10-CM

## 2013-09-23 DIAGNOSIS — Z79899 Other long term (current) drug therapy: Secondary | ICD-10-CM | POA: Diagnosis not present

## 2013-09-23 DIAGNOSIS — I209 Angina pectoris, unspecified: Secondary | ICD-10-CM | POA: Diagnosis not present

## 2013-09-23 DIAGNOSIS — Z880 Allergy status to penicillin: Secondary | ICD-10-CM | POA: Diagnosis not present

## 2013-09-23 DIAGNOSIS — Z23 Encounter for immunization: Secondary | ICD-10-CM | POA: Insufficient documentation

## 2013-09-23 DIAGNOSIS — M79609 Pain in unspecified limb: Secondary | ICD-10-CM | POA: Insufficient documentation

## 2013-09-23 DIAGNOSIS — R079 Chest pain, unspecified: Secondary | ICD-10-CM | POA: Diagnosis not present

## 2013-09-23 DIAGNOSIS — I498 Other specified cardiac arrhythmias: Secondary | ICD-10-CM | POA: Diagnosis not present

## 2013-09-23 DIAGNOSIS — G43909 Migraine, unspecified, not intractable, without status migrainosus: Secondary | ICD-10-CM | POA: Diagnosis not present

## 2013-09-23 DIAGNOSIS — M129 Arthropathy, unspecified: Secondary | ICD-10-CM | POA: Diagnosis not present

## 2013-09-23 DIAGNOSIS — I10 Essential (primary) hypertension: Secondary | ICD-10-CM | POA: Diagnosis not present

## 2013-09-23 DIAGNOSIS — I6529 Occlusion and stenosis of unspecified carotid artery: Secondary | ICD-10-CM | POA: Diagnosis not present

## 2013-09-23 DIAGNOSIS — R011 Cardiac murmur, unspecified: Secondary | ICD-10-CM | POA: Insufficient documentation

## 2013-09-23 DIAGNOSIS — E119 Type 2 diabetes mellitus without complications: Secondary | ICD-10-CM

## 2013-09-23 DIAGNOSIS — I152 Hypertension secondary to endocrine disorders: Secondary | ICD-10-CM | POA: Diagnosis present

## 2013-09-23 DIAGNOSIS — I2510 Atherosclerotic heart disease of native coronary artery without angina pectoris: Secondary | ICD-10-CM

## 2013-09-23 DIAGNOSIS — E785 Hyperlipidemia, unspecified: Secondary | ICD-10-CM | POA: Diagnosis not present

## 2013-09-23 DIAGNOSIS — R072 Precordial pain: Secondary | ICD-10-CM | POA: Diagnosis not present

## 2013-09-23 HISTORY — DX: Hypertensive heart and chronic kidney disease with heart failure and stage 1 through stage 4 chronic kidney disease, or unspecified chronic kidney disease: I13.0

## 2013-09-23 HISTORY — DX: Angina pectoris, unspecified: I20.9

## 2013-09-23 HISTORY — DX: Chronic kidney disease, stage 3 (moderate): N18.3

## 2013-09-23 HISTORY — DX: Hypertensive heart and chronic kidney disease with heart failure and stage 1 through stage 4 chronic kidney disease, or unspecified chronic kidney disease: I50.30

## 2013-09-23 HISTORY — DX: Chronic kidney disease, stage 3 unspecified: N18.30

## 2013-09-23 LAB — CK TOTAL AND CKMB (NOT AT ARMC)
CK, MB: 2.9 ng/mL (ref 0.3–4.0)
Relative Index: 2 (ref 0.0–2.5)
Total CK: 146 U/L (ref 7–177)

## 2013-09-23 LAB — CBC
HCT: 40.3 % (ref 36.0–46.0)
HCT: 39.6 % (ref 36.0–46.0)
Hemoglobin: 13.6 g/dL (ref 12.0–15.0)
MCHC: 33.8 g/dL (ref 30.0–36.0)
MCV: 89.2 fL (ref 78.0–100.0)
Platelets: 278 10*3/uL (ref 150–400)
RBC: 4.52 MIL/uL (ref 3.87–5.11)
RBC: 4.44 MIL/uL (ref 3.87–5.11)
RDW: 14.2 % (ref 11.5–15.5)
WBC: 6.6 10*3/uL (ref 4.0–10.5)
WBC: 6.6 10*3/uL (ref 4.0–10.5)

## 2013-09-23 LAB — BASIC METABOLIC PANEL
BUN: 13 mg/dL (ref 6–23)
Chloride: 103 mEq/L (ref 96–112)
GFR calc Af Amer: 64 mL/min — ABNORMAL LOW (ref >90)
Glucose, Bld: 87 mg/dL (ref 70–99)
Potassium: 4 mEq/L (ref 3.5–5.1)

## 2013-09-23 LAB — CREATININE, SERUM
Creatinine, Ser: 1.02 mg/dL (ref 0.50–1.10)
GFR calc Af Amer: 60 mL/min — ABNORMAL LOW (ref >90)
GFR calc non Af Amer: 52 mL/min — ABNORMAL LOW (ref >90)

## 2013-09-23 LAB — POCT I-STAT TROPONIN I: Troponin i, poc: 0 ng/mL (ref 0.00–0.08)

## 2013-09-23 LAB — TROPONIN I: Troponin I: <0.3 ng/mL (ref <0.30)

## 2013-09-23 LAB — TSH: TSH: 2.366 u[IU]/mL (ref 0.350–4.500)

## 2013-09-23 LAB — GLUCOSE, CAPILLARY: Glucose-Capillary: 74 mg/dL (ref 70–99)

## 2013-09-23 MED ORDER — ACETAMINOPHEN 500 MG PO TABS: 1000.0000 mg | ORAL_TABLET | Freq: Four times a day (QID) | ORAL | Status: DC | PRN

## 2013-09-23 MED ORDER — INFLUENZA VAC SPLIT QUAD 0.5 ML IM SUSP
0.5000 mL | INTRAMUSCULAR | Status: AC
  Administered 2013-09-24: 0.5 mL via INTRAMUSCULAR
  Filled 2013-09-23: qty 0.5

## 2013-09-23 MED ORDER — METOPROLOL SUCCINATE ER 100 MG PO TB24
100.0000 mg | ORAL_TABLET | Freq: Every day | ORAL | Status: DC
  Administered 2013-09-23 – 2013-09-24 (×2): 100 mg via ORAL
  Filled 2013-09-23 (×2): qty 1

## 2013-09-23 MED ORDER — LABETALOL HCL 5 MG/ML IV SOLN
10.0000 mg | Freq: Once | INTRAVENOUS | Status: AC
  Administered 2013-09-23: 10 mg via INTRAVENOUS
  Filled 2013-09-23: qty 4

## 2013-09-23 MED ORDER — LORATADINE 10 MG PO TABS
10.0000 mg | ORAL_TABLET | Freq: Every day | ORAL | Status: DC | PRN
  Filled 2013-09-23: qty 1

## 2013-09-23 MED ORDER — ADULT MULTIVITAMIN W/MINERALS CH
1.0000 | ORAL_TABLET | Freq: Every day | ORAL | Status: DC
  Administered 2013-09-24: 1 via ORAL
  Filled 2013-09-23: qty 1

## 2013-09-23 MED ORDER — ALBUTEROL SULFATE HFA 108 (90 BASE) MCG/ACT IN AERS: 2.0000 | INHALATION_SPRAY | Freq: Four times a day (QID) | RESPIRATORY_TRACT | Status: DC | PRN

## 2013-09-23 MED ORDER — CLOPIDOGREL BISULFATE 75 MG PO TABS
75.0000 mg | ORAL_TABLET | Freq: Every day | ORAL | Status: DC
  Administered 2013-09-24: 75 mg via ORAL
  Filled 2013-09-23: qty 1

## 2013-09-23 MED ORDER — IMIPRAMINE HCL 50 MG PO TABS
50.0000 mg | ORAL_TABLET | Freq: Two times a day (BID) | ORAL | Status: DC
  Administered 2013-09-23 – 2013-09-24 (×2): 50 mg via ORAL
  Filled 2013-09-23 (×3): qty 1

## 2013-09-23 MED ORDER — NITROGLYCERIN 0.4 MG SL SUBL: 0.4000 mg | SUBLINGUAL_TABLET | SUBLINGUAL | Status: DC | PRN

## 2013-09-23 MED ORDER — ONDANSETRON HCL 4 MG/2ML IJ SOLN: 4.0000 mg | Freq: Four times a day (QID) | INTRAMUSCULAR | Status: DC | PRN

## 2013-09-23 MED ORDER — ISOSORBIDE MONONITRATE ER 60 MG PO TB24
120.0000 mg | ORAL_TABLET | Freq: Every day | ORAL | Status: DC
Start: 2013-09-24 — End: 2013-09-24
  Filled 2013-09-23: qty 2

## 2013-09-23 MED ORDER — PROMETHAZINE HCL 12.5 MG PO TABS: 12.5000 mg | ORAL_TABLET | Freq: Four times a day (QID) | ORAL | Status: DC | PRN

## 2013-09-23 MED ORDER — NITROGLYCERIN 0.4 MG SL SUBL
0.4000 mg | SUBLINGUAL_TABLET | SUBLINGUAL | Status: DC | PRN
  Administered 2013-09-23: 0.4 mg via SUBLINGUAL
  Filled 2013-09-23: qty 25

## 2013-09-23 MED ORDER — MOMETASONE FURO-FORMOTEROL FUM 100-5 MCG/ACT IN AERO
2.0000 | INHALATION_SPRAY | Freq: Two times a day (BID) | RESPIRATORY_TRACT | Status: DC
  Administered 2013-09-23 – 2013-09-24 (×2): 2 via RESPIRATORY_TRACT
  Filled 2013-09-23: qty 8.8

## 2013-09-23 MED ORDER — POLYVINYL ALCOHOL 1.4 % OP SOLN
1.0000 [drp] | Freq: Four times a day (QID) | OPHTHALMIC | Status: DC | PRN
  Filled 2013-09-23: qty 15

## 2013-09-23 MED ORDER — SODIUM CHLORIDE 0.9 % IJ SOLN: 3.0000 mL | INTRAMUSCULAR | Status: DC | PRN

## 2013-09-23 MED ORDER — SODIUM CHLORIDE 0.9 % IJ SOLN
3.0000 mL | Freq: Two times a day (BID) | INTRAMUSCULAR | Status: DC
  Administered 2013-09-23: 3 mL via INTRAVENOUS

## 2013-09-23 MED ORDER — ATORVASTATIN CALCIUM 40 MG PO TABS
40.0000 mg | ORAL_TABLET | Freq: Every evening | ORAL | Status: DC
  Administered 2013-09-23: 40 mg via ORAL
  Filled 2013-09-23 (×2): qty 1

## 2013-09-23 MED ORDER — PANTOPRAZOLE SODIUM 40 MG PO TBEC
40.0000 mg | DELAYED_RELEASE_TABLET | Freq: Every day | ORAL | Status: DC
  Administered 2013-09-24: 40 mg via ORAL
  Filled 2013-09-23: qty 1

## 2013-09-23 MED ORDER — SODIUM CHLORIDE 0.9 % IV SOLN: 250.0000 mL | INTRAVENOUS | Status: DC | PRN

## 2013-09-23 MED ORDER — IRBESARTAN 300 MG PO TABS
300.0000 mg | ORAL_TABLET | Freq: Every day | ORAL | Status: DC
  Administered 2013-09-23 – 2013-09-24 (×2): 300 mg via ORAL
  Filled 2013-09-23 (×2): qty 1

## 2013-09-23 MED ORDER — LEVOTHYROXINE SODIUM 25 MCG PO TABS
25.0000 ug | ORAL_TABLET | Freq: Every day | ORAL | Status: DC
  Administered 2013-09-24: 25 ug via ORAL
  Filled 2013-09-23 (×2): qty 1

## 2013-09-23 MED ORDER — INSULIN ASPART 100 UNIT/ML ~~LOC~~ SOLN: 0.0000 [IU] | Freq: Three times a day (TID) | SUBCUTANEOUS | Status: DC

## 2013-09-23 MED ORDER — TRAMADOL HCL 50 MG PO TABS: 50.0000 mg | ORAL_TABLET | Freq: Three times a day (TID) | ORAL | Status: DC | PRN

## 2013-09-23 MED ORDER — HYPROMELLOSE (GONIOSCOPIC) 2.5 % OP SOLN: 1.0000 [drp] | Freq: Four times a day (QID) | OPHTHALMIC | Status: DC | PRN

## 2013-09-23 MED ORDER — GLIMEPIRIDE 1 MG PO TABS
1.0000 mg | ORAL_TABLET | Freq: Every day | ORAL | Status: DC
  Administered 2013-09-24: 1 mg via ORAL
  Filled 2013-09-23 (×2): qty 1

## 2013-09-23 MED ORDER — HEPARIN SODIUM (PORCINE) 5000 UNIT/ML IJ SOLN
5000.0000 [IU] | Freq: Three times a day (TID) | INTRAMUSCULAR | Status: DC
  Administered 2013-09-23 – 2013-09-24 (×2): 5000 [IU] via SUBCUTANEOUS
  Filled 2013-09-23 (×5): qty 1

## 2013-09-23 NOTE — H&P (Addendum)
[name] MRN: [id number], DOB/AGE: 77/06/1934   Admit date: 09/23/2013  Primary Physician: Debe Coder, MD Primary Cardiologist: Golden Circle, MD   Pt. Profile:  79 year old female with history of right arm and chest pain as well as moderate nonobstructive CAD who presented to the ED with recurrent right arm pain.  Problem List  Past Medical History  Diagnosis Date  . GERD (gastroesophageal reflux disease)   . Hypertension   . Hypothyroidism   . Anemia   . Hyperlipidemia   . Sinus arrhythmia   . History of migraine headaches     a. Next atypical symptoms in the past. Patient was started on Neurontin for possible neuropathic origin of her pain.  Marland Kitchen COPD (chronic obstructive pulmonary disease)     a. PFTs 2011 show FEV1/FVC of 96%  . Heart murmur, systolic     2-D echo in December 2011 showed a normal EF with grade 1 diastolic dysfunction, trivial pulmonary regurgitation and mildly elevated PA pressure at 37 mmHg probably secondary to her COPD.dynamic obstruction-mid cavity obliteration;  b. 02/2012 Echo: EF 60-65%, mild LVH, PASP .  Marland Kitchen CAD (coronary artery disease)     a. LHC 08/23/11: dLM 40-50%, oRI 40%, oCFX 40%, oD1 70% (small and not amenable to PCI).  LM lesion did not appear to be flow limiting.  Medical rx was recommended;  b. Echo 08/23/11: mild LVH, EF 60-65%, grade 1 diast dysfxn, mild BAE, PASP 24;  c. 02/2012  Cath: LM 50d, LAD 50p, D1 70ost, RI 70p, RCA ok->Med Rx;  d. 01/2013 Cardiolite: EF 88, no ischemia/infarct.  . Carotid stenosis     a. dopplers 10/12:  0-39% bilat ICA;  b. 10/2012 U/S: 0-39% bilat, f/u 1 yr (10/2013).  . Migraines   . Chronic bronchitis   . Shortness of breath on exertion   . Type II diabetes mellitus     a. On oral hypoglycemic agents.  . Blood transfusion   . History of stomach ulcers 1970's  . Arthritis   . Polymyalgia rheumatica   . Diverticulosis of colon (without mention of hemorrhage)   . Benign hypertensive heart  and kidney disease with diastolic CHF, NYHA class II and CKD stage III     Past Surgical History  Procedure Laterality Date  . Vaginal hysterectomy  1976     total abdominal hysterectomy for reasons unknown  . Cataract extraction w/ intraocular lens  implant, bilateral  1990's     Allergies  Allergies  Allergen Reactions  . Aspirin Swelling and Other (See Comments)    REACTION: Angioedema  . Codeine Nausea And Vomiting    "makes me deathly sick"  . Penicillins Nausea And Vomiting    "makes me go to the bathroom real bad"    HPI  79 year old female with the above problem list. She does have a history of right arm and left chest pain and has been evaluated in the past with diagnostic cardiac catheterization x2. Her last catheterization was in April of 2013 revealing moderate nonobstructive coronary artery disease and recommendation was made for ongoing medical therapy. Earlier this year, she underwent repeat Cardiolite stress testing which showed no evidence of ischemia or infarct with hyperdynamic LV function (March 2014).  She was last seen in clinic by Dr. Shirlee Latch on October 21, at which time she was doing well. This morning, shortly after awakening and using the bathroom, she made her way back to her bed and laid down when she began  to experience severe right shoulder arm and hand pain followed by bilateral leg and foot pain and tingling, and left arm pain, and mild left chest pain. There was no associated dyspnea, nausea, vomiting, or presyncope. Symptoms last about 5 minutes and then resolve spontaneously. Unfortunately, symptoms recurred multiple times while at home, each time involving the right arm but not always involving any of her other limbs or chest. At one point she had pain in the right neck associated with right arm pain. Recurrent symptoms would last between seconds and a few minutes and resolve spontaneously. Her son took her to the [facility] where ECG is nonacute and  point-of-care troponins have been normal x2. During my interview, she reported recurrent right arm pain which was worse with internal and external rotation of her shoulder.  Home Medications  Prior to Admission medications   Medication Sig Start Date End Date Taking? Authorizing Provider  acetaminophen (TYLENOL) 500 MG tablet Take 1,000 mg by mouth every 6 (six) hours as needed for headache. For pain   Yes Historical Provider, MD  albuterol (PROVENTIL HFA;VENTOLIN HFA) 108 (90 BASE) MCG/ACT inhaler Inhale 2 puffs into the lungs every 6 (six) hours as needed for wheezing or shortness of breath.   Yes Historical Provider, MD  atorvastatin (LIPITOR) 40 MG tablet Take 40 mg by mouth every evening.   Yes Historical Provider, MD  clopidogrel (PLAVIX) 75 MG tablet Take 75 mg by mouth daily with breakfast.   Yes Historical Provider, MD  esomeprazole (NEXIUM) 40 MG capsule Take 40 mg by mouth every morning.   Yes Historical Provider, MD  Fluticasone-Salmeterol (ADVAIR) 250-50 MCG/DOSE AEPB Inhale 1 puff into the lungs 2 (two) times daily.   Yes Historical Provider, MD  glimepiride (AMARYL) 1 MG tablet Take 1 mg by mouth daily with breakfast.   Yes Historical Provider, MD  hydroxypropyl methylcellulose (ISOPTO TEARS) 2.5 % ophthalmic solution Place 1 drop into both eyes 4 (four) times daily as needed for dry eyes.   Yes Historical Provider, MD  imipramine (TOFRANIL) 50 MG tablet Take 50 mg by mouth 2 (two) times daily.   Yes Historical Provider, MD  isosorbide mononitrate (IMDUR) 60 MG 24 hr tablet Take 120 mg by mouth daily.   Yes Historical Provider, MD  levothyroxine (SYNTHROID, LEVOTHROID) 25 MCG tablet Take 25 mcg by mouth daily before breakfast.   Yes Historical Provider, MD  loratadine (CLARITIN) 10 MG tablet Take 10 mg by mouth daily as needed for allergies.   Yes Historical Provider, MD  metoprolol succinate (TOPROL-XL) 100 MG 24 hr tablet Take 100 mg by mouth daily. Take with or immediately  following a meal.   Yes Historical Provider, MD  Multiple Vitamin (MULITIVITAMIN WITH MINERALS) TABS Take 1 tablet by mouth daily.   Yes Historical Provider, MD  nitroGLYCERIN (NITROSTAT) 0.4 MG SL tablet Place 0.4 mg under the tongue every 5 (five) minutes as needed for chest pain.   Yes Historical Provider, MD  ONE TOUCH ULTRA TEST test strip use as directed 04/06/13  Yes Inez Catalina, MD  [facility] DELICA LANCETS 33G MISC use as directed 04/06/13  Yes Inez Catalina, MD  promethazine (PHENERGAN) 12.5 MG tablet Take 12.5 mg by mouth every 6 (six) hours as needed for nausea or vomiting.   Yes Historical Provider, MD  traMADol (ULTRAM) 50 MG tablet Take 50 mg by mouth every 8 (eight) hours as needed (pain).   Yes Historical Provider, MD  valsartan (DIOVAN) 160 MG tablet Take  160 mg by mouth daily.   Yes Historical Provider, MD    Family History  Family History  Problem Relation Age of Onset  . Colon cancer Maternal Grandmother     died at 60  . COPD Father     died @ 60  . Other Mother     died of unknown causes in her 69's. Pt raised by grandmother.    Social History  History   Social History  . Marital Status: Widowed    Spouse Name: N/A    Number of Children: N/A  . Years of Education: N/A   Occupational History  . Not on file.   Social History Main Topics  . Smoking status: Never Smoker   . Smokeless tobacco: Never Used  . Alcohol Use: No     Comment: "Last drink 1967"  . Drug Use: No  . Sexual Activity: Not on file   Other Topics Concern  . Not on file   Social History Narrative   Never smoked. Lives in [place] with her dtr.  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     Review of Systems General:  No chills, fever, night sweats or weight changes.  Cardiovascular:  Intermittent R arm/L arm/R neck/L chest pain this AM.  No dyspnea on exertion, edema, orthopnea, palpitations, paroxysmal nocturnal dyspnea. Dermatological: No rash,  lesions/masses Respiratory: No cough, dyspnea Urologic: No hematuria, dysuria Abdominal:   +++ constipation.  No nausea, vomiting, diarrhea, bright red blood per rectum, melena, or hematemesis Neurologic:  No visual changes, wkns, changes in mental status. All other systems reviewed and are otherwise negative except as noted above.  Physical Exam  Blood pressure 154/87, pulse 54, temperature 98 F (36.7 C), temperature source Oral, resp. rate 17, weight 136 lb (61.689 kg), last menstrual period 02/10/1966, SpO2 100.00%.  General: Pleasant, NAD Psych: Normal affect. Neuro: Alert and oriented X 3. Moves all extremities spontaneously. HEENT: Normal, somewhat HOH.  Neck: Supple without bruits or JVD. Lungs:  Resp regular and unlabored, CTA. Heart: RRR no s3, s4, 2/6 SEM RUSB Abdomen: Soft, non-tender, non-distended, BS + x 4.  Extremities: No clubbing, cyanosis or edema. DP/PT 1+ and equal bilaterally. Radials 2+ bilaterally. Musculoskeletal:  The right arm and right upper back are nontender to palpation. Pain in the right arm and shoulder is worse with internal and external rotation of the shoulder as well as abduction and adduction.   Labs  Trop i, poc: 0.00 x 2  Lab Results  Component Value Date   WBC 6.6 09/23/2013   HGB 13.6 09/23/2013   HCT 40.3 09/23/2013   MCV 89.2 09/23/2013   PLT 290 09/23/2013    Recent Labs Lab 09/23/13 1140  NA 140  K 4.0  CL 103  CO2 29  BUN 13  CREATININE 0.96  CALCIUM 9.9  GLUCOSE 87    Radiology/Studies  Dg Chest 2 View  09/23/2013   CLINICAL DATA:  Chest pain. Bilateral arm and leg pain for 1 day.  EXAM: CHEST  2 VIEW  .  IMPRESSION: No active cardiopulmonary disease.   Electronically Signed   By: Andreas Newport M.D.   On: 09/23/2013 13:17   ECG  Sinus arrhythmia, 66, lad, rae, poor r prog, no acute st/t changes.  ASSESSMENT AND PLAN  1. Right arm pain: Patient identifies this as a possible anginal equivalent. In the setting of right  arm pain, she has also experienced intermittent bilateral lower extremity  pain, left arm pain, and left chest pain, but none of these have occurred with any consistency. Episodes have been occurring at rest, lasting between seconds and minutes, and resolving spontaneously.  Her right arm pain is worse with range of motion exercises but not worse with palpation.  POC troponin's are normal and ecg is non-acute. Doubt ischemia/cardiac origin as source of pain however she does have known, moderate, nonobstructive disease.  Will plan to observe today and plan early d/c tomorrow if CE remain negative w/o further ischemic eval given neg cardiolite in March of this year.  Cont plavix (allergy to asa), statin bb, nitrate, and arb.  2.  HTN:  BP markedly elevated upon arrival to ED.  She says that her BP has been trending high at home over the past week as well.  Follow BP in house and adjust home meds as necessary.  Can consider titration of ARB to 320 mg daily.  3.  HL:  Cont statin. F/U lipids/lft's as dose recently adjusted in clinic.  4.  PMR:  ? Contribution to Ss.  Will check PMR.  Recently on prednisone.  Signed, Nicolasa Ducking, NP 09/23/2013, 2:05 PM  Patient seen with NP, agree with the above note.  This morning, she has had pain in her thighs, shoulders, feet, and fingers.  She was concerned that this was cardiac as she had pain in her arms with prior cardiac pain.  I wonder if this could be a flare of her PMR. She was on prednisone for PMR up to a few weeks ago and was weaned off.  - Will bring in for 23 hour observation and cycle cardiac enzymes/ECGs.  - If she rules out, no further cardiac workup.  - Would get ESR, make appointment with PCP for evaluation of PMR.  - BP running high, will increase ARB.   Marca Ancona 09/23/2013 2:29 PM

## 2013-09-23 NOTE — Telephone Encounter (Signed)
Pt called and left message that she had pain in her arms and legs, when call was returned she stated she is now in the ED, she was wished well and call ended

## 2013-09-23 NOTE — ED Notes (Signed)
Pt in c/o bilateral arm and leg pain, also bilateral foot pain, upon entering triage room pt mentioned that she has also had "mild" chest pain this am, EKG completed at that time, pt states all symptoms started around 7am this morning, states pain started in right arm, then moved to right leg then left leg and arm, c/o pain at this time to all areas, pt alert and oriented, denies other symptoms at this time, pt states she has a history of angina and this chest pain is typical for her, pt describes the pain as a "mild sharp pain" to left chest and left axillary area

## 2013-09-23 NOTE — ED Notes (Signed)
MD at bedside. 

## 2013-09-23 NOTE — ED Provider Notes (Signed)
CSN: [id number]     Arrival date & time 09/23/13  1114 History   First MD Initiated Contact with Patient 09/23/13 1128     Chief Complaint  Patient presents with  . Chest Pain  . Arm Pain  . Leg Pain   (Consider location/radiation/quality/duration/timing/severity/associated sxs/prior Treatment) Patient is a 79 y.o. female presenting with chest pain. The history is provided by the patient.  Chest Pain Pain location:  Substernal area (R arm) Pain quality: aching   Pain radiates to:  Does not radiate Pain radiates to the back: no   Pain severity:  Moderate Onset quality:  Sudden Timing:  Intermittent Progression:  Unchanged Chronicity:  Recurrent (last time she had R arm pain she had angina 2 years ago) Context: not breathing, not eating, no movement and not raising an arm   Relieved by:  Nothing Worsened by:  Nothing tried Ineffective treatments:  None tried Associated symptoms: no cough, no fever and no shortness of breath     Past Medical History  Diagnosis Date  . GERD (gastroesophageal reflux disease)   . Hypertension   . Hypothyroidism   . Anemia   . Hyperlipidemia   . Sinus arrhythmia   . History of migraine headaches     Next atypical symptoms in the past. Patient was started on Neurontin for possible neuropathic origin of her pain.  Marland Kitchen COPD (chronic obstructive pulmonary disease)     PFTs 2011 show FEV1/FVC of 96%  . Heart murmur, systolic     2-D echo in December 2011 showed a normal EF with grade 1 diastolic dysfunction, trivial pulmonary regurgitation and mildly elevated PA pressure at 37 mmHg probably secondary to her COPD.dynamic obstruction-mid cavity obliteration,   . CAD (coronary artery disease)     LHC 08/23/11: dLM 40-50%, oRI 40%, oCFX 40%, oD1 70% (small and not amenable to PCI).  LM lesion did not appear to be flow limiting.  Medical rx was recommended.  Echo 08/23/11: mild LVH, EF 60-65%, grade 1 diast dysfxn, mild BAE, PASP 24.    . Carotid stenosis      dopplers 10/12:  0-39% bilat ICA (repeat 08/2012)  . Migraines   . Heart murmur   . Angina   . Chronic bronchitis   . Shortness of breath on exertion   . Type II diabetes mellitus     On oral hypoglycemic agents.  . Blood transfusion   . History of stomach ulcers 1970's  . Arthritis   . Polymyalgia rheumatica   . Diverticulosis of colon (without mention of hemorrhage)    Past Surgical History  Procedure Laterality Date  . Vaginal hysterectomy  1976     total abdominal hysterectomy for reasons unknown  . Cataract extraction w/ intraocular lens  implant, bilateral  1990's   Family History  Problem Relation Age of Onset  . Colon cancer Mother 73  . COPD Father 71   History  Substance Use Topics  . Smoking status: Never Smoker   . Smokeless tobacco: Never Used  . Alcohol Use: No     Comment: "Last drink 1967"   OB History   Grav Para Term Preterm Abortions TAB SAB Ect Mult Living                 Review of Systems  Constitutional: Negative for fever.  Respiratory: Negative for cough and shortness of breath.   Cardiovascular: Positive for chest pain.  All other systems reviewed and are negative.    Allergies  Aspirin; Codeine; and Penicillins  Home Medications   Current Outpatient Rx  Name  Route  Sig  Dispense  Refill  . acetaminophen (TYLENOL) 500 MG tablet   Oral   Take 1,000 mg by mouth every 6 (six) hours as needed. For pain         . ADVAIR DISKUS 250-50 MCG/DOSE AEPB      inhale 1 dose by mouth every 12 hours   60 each   2   . atorvastatin (LIPITOR) 40 MG tablet   Oral   Take 1 tablet (40 mg total) by mouth daily.   30 tablet   6   . clopidogrel (PLAVIX) 75 MG tablet   Oral   Take 1 tablet (75 mg total) by mouth daily.   30 tablet   5   . DIOVAN 160 MG tablet      take 1 tablet by mouth once daily   90 tablet   4   . esomeprazole (NEXIUM) 40 MG capsule   Oral   Take 1 capsule (40 mg total) by mouth daily before breakfast.   30  capsule   7   . glimepiride (AMARYL) 1 MG tablet   Oral   Take 1 tablet (1 mg total) by mouth daily before breakfast.   30 tablet   3   . guaiFENesin-codeine (ROBITUSSIN AC) 100-10 MG/5ML syrup   Oral   Take 5 mLs by mouth 4 (four) times daily as needed for cough.   120 mL   1   . imipramine (TOFRANIL) 50 MG tablet      take 1 tablet by mouth twice a day   60 tablet   3   . isosorbide mononitrate (IMDUR) 60 MG 24 hr tablet      take 2 tablets by mouth daily   60 tablet   1   . levothyroxine (SYNTHROID, LEVOTHROID) 25 MCG tablet      take 1 tablet by mouth once daily   90 tablet   1   . lidocaine (LIDODERM) 5 %   Transdermal   Place 1 patch onto the skin daily. Remove & discard patch within 12 hours or as directed by MD   10 patch   0   . loratadine (CLARITIN) 10 MG tablet   Oral   Take 1 tablet (10 mg total) by mouth daily as needed. For allergies   30 tablet   5   . metoprolol succinate (TOPROL-XL) 100 MG 24 hr tablet   Oral   Take 1 tablet (100 mg total) by mouth daily. Take with or immediately following a meal.   30 tablet   11   . Multiple Vitamin (MULITIVITAMIN WITH MINERALS) TABS   Oral   Take 1 tablet by mouth daily.         . nitroGLYCERIN (NITROSTAT) 0.4 MG SL tablet   Sublingual   Place 1 tablet (0.4 mg total) under the tongue every 5 (five) minutes as needed for chest pain. For chest pain   25 tablet   6   . ONE TOUCH ULTRA TEST test strip      use as directed   100 each   12   . ONETOUCH DELICA LANCETS 33G MISC      use as directed   100 each   6   . predniSONE (DELTASONE) 5 MG tablet   Oral   Take 3 tablets (15 mg total) by mouth as directed. Please take 15 mg every  morning for 1 more week starting from today 07/02/2013 to 07/08/2013 Then take 10 mg every morning starting from 07/09/2013 to 07/16/2013 Then take 5 mg every morning from 07/17/2013 to 07/23/2013 and STOP   42 tablet   0   . PROAIR HFA 108 (90 BASE) MCG/ACT  inhaler      inhale 2 puffs by mouth every 4 to 6 hours if needed for shortness of breath   8.5 g   3   . promethazine (PHENERGAN) 12.5 MG tablet   Oral   Take 1 tablet (12.5 mg total) by mouth every 6 (six) hours as needed for nausea (Can take 1-2 tablets at a time).   30 tablet   0   . traMADol (ULTRAM) 50 MG tablet   Oral   Take 1 tablet (50 mg total) by mouth every 8 (eight) hours as needed for pain.   20 tablet   0    BP 199/77  Pulse 72  Temp(Src) 97.9 F (36.6 C) (Oral)  Resp 20  Wt 136 lb (61.689 kg)  SpO2 100%  LMP 02/10/1966 Physical Exam  Nursing note and vitals reviewed. Constitutional: She is oriented to person, place, and time. She appears well-developed and well-nourished. No distress.  HENT:  Head: Normocephalic and atraumatic.  Eyes: EOM are normal. Pupils are equal, round, and reactive to light.  Neck: Normal range of motion. Neck supple.  Cardiovascular: Normal rate and regular rhythm.  Exam reveals no friction rub.   No murmur heard. Pulmonary/Chest: Effort normal and breath sounds normal. No respiratory distress. She has no wheezes. She has no rales.  Abdominal: Soft. She exhibits no distension. There is no tenderness. There is no rebound.  Musculoskeletal: Normal range of motion. She exhibits no edema.  Neurological: She is alert and oriented to person, place, and time.  Skin: She is not diaphoretic.    ED Course  Procedures (including critical care time) Labs Review Labs Reviewed  CBC  BASIC METABOLIC PANEL  POCT I-STAT TROPONIN I   Imaging Review No results found.  EKG Interpretation     Ventricular Rate:  66 PR Interval:  170 QRS Duration: 80 QT Interval:  428 QTC Calculation: 448 R Axis:   -19 Text Interpretation:  Normal sinus rhythm with sinus arrhythmia Right atrial enlargement Anterior infarct , age undetermined Abnormal ECG Similar to previous tracing             MDM   1. Chest pain   2. Right arm pain   3.  Acute angina    79 year old female with history of coronary artery disease is managed medically without stents presents with right arm pain. She's having right arm pain this morning is intermittent. It feels like her prior angina. She's also having some left arm pain. She's also had some leg pain in bilateral legs. It's been intermittent in the urine entire body. She states right arm pain is similar to her prior angina and she hasn't had any 2 years since she was last evaluated by cardiology. She also states mild left chest pain. She is allergic to aspirin as a cause a rash. She also doesn't like taking nitroglycerin because it causes her headache. She is concerned about her heart today. Here vitals are stable. Blood pressures are equivocal in both arms. She has good pulses in all caries. Exam is benign. EKG similar to prior. I am concerned about angina symptoms as she is alert he on Imdur and is having right arm pain.  She is also concerned about her heart.  Labs normal. [facility] cards admitting for observation.  Dagmar Hait, MD 09/23/13 [phone]

## 2013-09-24 DIAGNOSIS — K219 Gastro-esophageal reflux disease without esophagitis: Secondary | ICD-10-CM | POA: Diagnosis not present

## 2013-09-24 DIAGNOSIS — R079 Chest pain, unspecified: Secondary | ICD-10-CM | POA: Diagnosis not present

## 2013-09-24 DIAGNOSIS — M353 Polymyalgia rheumatica: Secondary | ICD-10-CM | POA: Diagnosis not present

## 2013-09-24 DIAGNOSIS — I209 Angina pectoris, unspecified: Secondary | ICD-10-CM | POA: Diagnosis not present

## 2013-09-24 DIAGNOSIS — I2510 Atherosclerotic heart disease of native coronary artery without angina pectoris: Secondary | ICD-10-CM

## 2013-09-24 DIAGNOSIS — M79609 Pain in unspecified limb: Secondary | ICD-10-CM | POA: Diagnosis not present

## 2013-09-24 DIAGNOSIS — M79601 Pain in right arm: Secondary | ICD-10-CM | POA: Diagnosis present

## 2013-09-24 DIAGNOSIS — I10 Essential (primary) hypertension: Secondary | ICD-10-CM | POA: Diagnosis not present

## 2013-09-24 LAB — COMPREHENSIVE METABOLIC PANEL
Albumin: 4.2 g/dL (ref 3.5–5.2)
BUN: 15 mg/dL (ref 6–23)
CO2: 27 mEq/L (ref 19–32)
Calcium: 9.5 mg/dL (ref 8.4–10.5)
Chloride: 98 mEq/L (ref 96–112)
Creatinine, Ser: 1.14 mg/dL — ABNORMAL HIGH (ref 0.50–1.10)
GFR calc Af Amer: 52 mL/min — ABNORMAL LOW (ref >90)
Glucose, Bld: 144 mg/dL — ABNORMAL HIGH (ref 70–99)
Potassium: 3.7 mEq/L (ref 3.5–5.1)
Total Bilirubin: 0.5 mg/dL (ref 0.3–1.2)
Total Protein: 7.7 g/dL (ref 6.0–8.3)

## 2013-09-24 LAB — GLUCOSE, CAPILLARY: Glucose-Capillary: 114 mg/dL — ABNORMAL HIGH (ref 70–99)

## 2013-09-24 LAB — LIPID PANEL
Cholesterol: 167 mg/dL (ref 0–200)
HDL: 44 mg/dL (ref >39)
LDL Cholesterol: 86 mg/dL (ref 0–99)
Total CHOL/HDL Ratio: 3.8 RATIO
Triglycerides: 184 mg/dL — ABNORMAL HIGH (ref <150)
VLDL: 37 mg/dL (ref 0–40)

## 2013-09-24 LAB — TROPONIN I: Troponin I: <0.3 ng/mL (ref <0.30)

## 2013-09-24 LAB — CREATININE, SERUM: GFR calc non Af Amer: 53 mL/min — ABNORMAL LOW (ref >90)

## 2013-09-24 MED ORDER — VALSARTAN 320 MG PO TABS: 320.0000 mg | ORAL_TABLET | Freq: Every day | ORAL | Status: DC

## 2013-09-24 NOTE — Progress Notes (Signed)
Subjective: Feet hurt  No CP  No SOB Objective: Filed Vitals:   09/23/13 1949 09/23/13 2055 09/24/13 0459 09/24/13 0606  BP: 155/76  131/68   Pulse: 63  59   Temp: 96.9 F (36.1 C)  96.9 F (36.1 C)   TempSrc: Oral  Oral   Resp: 18  18   Height:    5\' 4"  (1.626 m)  Weight:   134 lb 14.4 oz (61.19 kg)   SpO2: 96% 98% 98%    Weight change:   Intake/Output Summary (Last 24 hours) at 09/24/13 0806 Last data filed at 09/23/13 2028  Gross per 24 hour  Intake      3 ml  Output      0 ml  Net      3 ml    General: Alert, awake, oriented x3, in no acute distress Neck:  JVP is normal Heart: Regular rate and rhythm, without murmurs, rubs, gallops.  Lungs: Clear to auscultation.  No rales or wheezes. Exemities:  No edema.   Neuro: Grossly intact, nonfocal.  Tele:  SB/SR Lab Results: Results for orders placed during the hospital encounter of 09/23/13 (from the past 24 hour(s))  CBC     Status: None   Collection Time    09/23/13 11:40 AM      Result Value Range   WBC 6.6  4.0 - 10.5 K/uL   RBC 4.52  3.87 - 5.11 MIL/uL   Hemoglobin 13.6  12.0 - 15.0 g/dL   HCT 16.1  09.6 - 04.5 %   MCV 89.2  78.0 - 100.0 fL   MCH 30.1  26.0 - 34.0 pg   MCHC 33.7  30.0 - 36.0 g/dL   RDW 40.9  81.1 - 91.4 %   Platelets 290  150 - 400 K/uL  BASIC METABOLIC PANEL     Status: Abnormal   Collection Time    09/23/13 11:40 AM      Result Value Range   Sodium 140  135 - 145 mEq/L   Potassium 4.0  3.5 - 5.1 mEq/L   Chloride 103  96 - 112 mEq/L   CO2 29  19 - 32 mEq/L   Glucose, Bld 87  70 - 99 mg/dL   BUN 13  6 - 23 mg/dL   Creatinine, Ser 7.82  0.50 - 1.10 mg/dL   Calcium 9.9  8.4 - 95.6 mg/dL   GFR calc non Af Amer 56 (*) >90 mL/min   GFR calc Af Amer 64 (*) >90 mL/min  POCT I-STAT TROPONIN I     Status: None   Collection Time    09/23/13 11:49 AM      Result Value Range   Troponin i, poc 0.00  0.00 - 0.08 ng/mL   Comment 3           POCT I-STAT TROPONIN I     Status: None   Collection  Time    09/23/13 12:40 PM      Result Value Range   Troponin i, poc 0.00  0.00 - 0.08 ng/mL   Comment 3           GLUCOSE, CAPILLARY     Status: None   Collection Time    09/23/13  4:54 PM      Result Value Range   Glucose-Capillary 74  70 - 99 mg/dL  SEDIMENTATION RATE     Status: Abnormal   Collection Time    09/23/13  6:33 PM  Result Value Range   Sed Rate 35 (*) 0 - 22 mm/hr  CK TOTAL AND CKMB     Status: None   Collection Time    09/23/13  6:33 PM      Result Value Range   Total CK 146  7 - 177 U/L   CK, MB 2.9  0.3 - 4.0 ng/mL   Relative Index 2.0  0.0 - 2.5  TROPONIN I     Status: None   Collection Time    09/23/13  6:33 PM      Result Value Range   Troponin I <0.30  <0.30 ng/mL  TSH     Status: None   Collection Time    09/23/13  6:33 PM      Result Value Range   TSH 2.366  0.350 - 4.500 uIU/mL  CBC     Status: None   Collection Time    09/23/13  6:33 PM      Result Value Range   WBC 6.6  4.0 - 10.5 K/uL   RBC 4.44  3.87 - 5.11 MIL/uL   Hemoglobin 13.4  12.0 - 15.0 g/dL   HCT 02.7  25.3 - 66.4 %   MCV 89.2  78.0 - 100.0 fL   MCH 30.2  26.0 - 34.0 pg   MCHC 33.8  30.0 - 36.0 g/dL   RDW 40.3  47.4 - 25.9 %   Platelets 278  150 - 400 K/uL  CREATININE, SERUM     Status: Abnormal   Collection Time    09/23/13  6:33 PM      Result Value Range   Creatinine, Ser 1.02  0.50 - 1.10 mg/dL   GFR calc non Af Amer 52 (*) >90 mL/min   GFR calc Af Amer 60 (*) >90 mL/min  GLUCOSE, CAPILLARY     Status: None   Collection Time    09/23/13  7:55 PM      Result Value Range   Glucose-Capillary 94  70 - 99 mg/dL  CREATININE, SERUM     Status: Abnormal   Collection Time    09/24/13 12:00 AM      Result Value Range   Creatinine, Ser 1.00  0.50 - 1.10 mg/dL   GFR calc non Af Amer 53 (*) >90 mL/min   GFR calc Af Amer 61 (*) >90 mL/min  TROPONIN I     Status: None   Collection Time    09/24/13 12:00 AM      Result Value Range   Troponin I <0.30  <0.30 ng/mL     Studies/Results: @RISRSLT24 @  Medications: Reviewed   @PROBHOSP @  1  Arm pain.  Patient r/o for MI  I agree with [facility]  I think her symptoms are more related to PMR  I do no think represent angina.  ESR is mildly elevated.  She needs to have this evaluated  May need to go back on prednisone.  Has appt with primary MD on 11/10  2.  HTN  ARB increased  Follow  3.  HL  Continue statin.    D/C today.    LOS: 1 day   Dietrich Pates 09/24/2013, 8:06 AM

## 2013-09-24 NOTE — Discharge Summary (Signed)
CARDIOLOGY DISCHARGE SUMMARY   [name] MRN: [id number] DOB/AGE: 77/06/1934 77 y.o.  Admit date: 09/23/2013 Discharge date: 09/24/2013  Primary Discharge Diagnosis:   Right arm pain - concern for angina Secondary Discharge Diagnosis:    HYPOTHYROIDISM   DIABETES MELLITUS, TYPE II   HYPERLIPIDEMIA   HYPERTENSION  Procedures: CXR  Hospital Course: Christine [name] is a 79 y.o. female with a history of nonobstructive CAD. She has a history of right arm pain and chest pain. She had multiple episodes of right arm pain that sometimes radiated into her right neck. She was concerned that it was angina and came to the emergency room where she was admitted for further evaluation and treatment.  The pain was worse with internal and external rotation of her shoulder. A chest x-ray was checked and showed no acute abnormality. Her cardiac enzymes were negative and her ECG was not acute. She was admitted overnight and monitored. She maintained sinus rhythm on telemetry. Her blood pressures were a little high at times and her ARB was increased. Her blood pressures gradually improved and she is to follow up as an outpatient.  Labs were checked including a lipid profile and a TSH. Her triglycerides are slightly elevated but her LDL and HDL are within normal limits stenosis change on her Lipitor was indicated. Her TSH was within normal limits as well, so she is to continue on her current Synthroid dose. Her blood sugars were managed with a combination of her home medications and sliding scale insulin. Her sedimentation rate was checked and was mildly elevated. She has a history of PMR but her ESR was not high enough to require initiation of steroid therapy at this time.  On 09/24/2013, she was seen by Dr. Tenny Craw. Dr. Tenny Craw felt her symptoms were more related to polymyalgia rheumatica and she is to followup with her primary physician.  Labs:   Lab Results  Component Value Date   WBC 6.6  09/23/2013   HGB 13.4 09/23/2013   HCT 39.6 09/23/2013   MCV 89.2 09/23/2013   PLT 278 09/23/2013     Recent Labs Lab 09/24/13 0700  NA 135  K 3.7  CL 98  CO2 27  BUN 15  CREATININE 1.14*  CALCIUM 9.5  PROT 7.7  BILITOT 0.5  ALKPHOS 151*  ALT 19  AST 24  GLUCOSE 144*    Recent Labs  09/23/13 1833 09/24/13 09/24/13 0700  CKTOTAL 146  --   --   CKMB 2.9  --   --   TROPONINI <0.30 <0.30 <0.30   Lipid Panel     Component Value Date/Time   CHOL 167 09/24/2013 0700   TRIG 184* 09/24/2013 0700   HDL 44 09/24/2013 0700   CHOLHDL 3.8 09/24/2013 0700   VLDL 37 09/24/2013 0700   LDLCALC 86 09/24/2013 0700   Lab Results  Component Value Date   TSH 2.366 09/23/2013   Lab Results  Component Value Date   ESRSEDRATE 35* 09/23/2013      Radiology: Dg Chest 2 View 09/23/2013   CLINICAL DATA:  Chest pain. Bilateral arm and leg pain for 1 day.  EXAM: CHEST  2 VIEW  COMPARISON:  09/18/2012. 03/13/2012.  FINDINGS: Cardiopericardial silhouette and mediastinal contours are within normal limits. Stable eventration of the left hemidiaphragm. Right-greater-than-left bilateral pleural apical scarring. There is no airspace disease or pleural effusion identified. Stable appearance of the left costophrenic angle on the lateral view due eventration of the  left hemidiaphragm.  IMPRESSION: No active cardiopulmonary disease.   Electronically Signed   By: Andreas Newport M.D.   On: 09/23/2013 13:17   EKG: 09/24/2013 Sinus rhythm Vent. rate 67 BPM PR interval 180 ms QRS duration 88 ms QT/QTc 446/471 ms P-R-T axes 78 -6 64   FOLLOW UP PLANS AND APPOINTMENTS Allergies  Allergen Reactions  . Aspirin Swelling and Other (See Comments)    REACTION: Angioedema  . Codeine Nausea And Vomiting    "makes me deathly sick"  . Penicillins Nausea And Vomiting    "makes me go to the bathroom real bad"     Medication List         acetaminophen 500 MG tablet  Commonly known as:  TYLENOL  Take 1,000 mg by  mouth every 6 (six) hours as needed for headache. For pain     albuterol 108 (90 BASE) MCG/ACT inhaler  Commonly known as:  PROVENTIL HFA;VENTOLIN HFA  Inhale 2 puffs into the lungs every 6 (six) hours as needed for wheezing or shortness of breath.     atorvastatin 40 MG tablet  Commonly known as:  LIPITOR  Take 40 mg by mouth every evening.     clopidogrel 75 MG tablet  Commonly known as:  PLAVIX  Take 75 mg by mouth daily with breakfast.     esomeprazole 40 MG capsule  Commonly known as:  NEXIUM  Take 40 mg by mouth every morning.     Fluticasone-Salmeterol 250-50 MCG/DOSE Aepb  Commonly known as:  ADVAIR  Inhale 1 puff into the lungs 2 (two) times daily.     glimepiride 1 MG tablet  Commonly known as:  AMARYL  Take 1 mg by mouth daily with breakfast.     hydroxypropyl methylcellulose 2.5 % ophthalmic solution  Commonly known as:  ISOPTO TEARS  Place 1 drop into both eyes 4 (four) times daily as needed for dry eyes.     imipramine 50 MG tablet  Commonly known as:  TOFRANIL  Take 50 mg by mouth 2 (two) times daily.     isosorbide mononitrate 60 MG 24 hr tablet  Commonly known as:  IMDUR  Take 120 mg by mouth daily.     levothyroxine 25 MCG tablet  Commonly known as:  SYNTHROID, LEVOTHROID  Take 25 mcg by mouth daily before breakfast.     loratadine 10 MG tablet  Commonly known as:  CLARITIN  Take 10 mg by mouth daily as needed for allergies.     metoprolol succinate 100 MG 24 hr tablet  Commonly known as:  TOPROL-XL  Take 100 mg by mouth daily. Take with or immediately following a meal.     multivitamin with minerals Tabs tablet  Take 1 tablet by mouth daily.     nitroGLYCERIN 0.4 MG SL tablet  Commonly known as:  NITROSTAT  Place 0.4 mg under the tongue every 5 (five) minutes as needed for chest pain.     ONE TOUCH ULTRA TEST test strip  Generic drug:  glucose blood  use as directed     ONETOUCH DELICA LANCETS 33G Misc  use as directed      promethazine 12.5 MG tablet  Commonly known as:  PHENERGAN  Take 12.5 mg by mouth every 6 (six) hours as needed for nausea or vomiting.     traMADol 50 MG tablet  Commonly known as:  ULTRAM  Take 50 mg by mouth every 8 (eight) hours as needed (pain).  valsartan 320 MG tablet  Commonly known as:  DIOVAN  Take 1 tablet (320 mg total) by mouth daily.        Discharge Orders   Future Appointments Provider Department Dept Phone   09/27/2013 8:45 AM Inez Catalina, MD [facility] [phone]   10/04/2013 4:00 PM Laurey Morale, MD [facility] [place] Office [phone]   Future Orders Complete By Expires   Diet - low sodium heart healthy  As directed    Diet Carb Modified  As directed    Increase activity slowly  As directed      Follow-up Information   Follow up with Marca Ancona, MD On 10/04/2013. (at 4:00 pm)    Specialty:  Cardiology   Contact information:   [address] [place] [address] [phone]       BRING ALL MEDICATIONS WITH YOU TO FOLLOW UP APPOINTMENTS  Time spent with patient to include physician time: 34 min Signed: Theodore Demark, PA-C 09/24/2013, 9:27 AM Co-Sign MD

## 2013-09-27 ENCOUNTER — Ambulatory Visit (INDEPENDENT_AMBULATORY_CARE_PROVIDER_SITE_OTHER): Payer: Medicare Other | Admitting: Internal Medicine

## 2013-09-27 ENCOUNTER — Encounter: Payer: Medicare Other | Admitting: Internal Medicine

## 2013-09-27 ENCOUNTER — Encounter: Payer: Self-pay | Admitting: Internal Medicine

## 2013-09-27 VITALS — BP 135/75 | HR 67 | Temp 97.6°F | Ht 64.0 in | Wt 133.6 lb

## 2013-09-27 DIAGNOSIS — I10 Essential (primary) hypertension: Secondary | ICD-10-CM | POA: Diagnosis not present

## 2013-09-27 DIAGNOSIS — I2510 Atherosclerotic heart disease of native coronary artery without angina pectoris: Secondary | ICD-10-CM

## 2013-09-27 DIAGNOSIS — M353 Polymyalgia rheumatica: Secondary | ICD-10-CM

## 2013-09-27 MED ORDER — PREDNISONE 5 MG PO TABS: 5.0000 mg | ORAL_TABLET | Freq: Every day | ORAL | Status: DC

## 2013-09-27 MED ORDER — PREDNISONE 5 MG PO TABS: 10.0000 mg | ORAL_TABLET | Freq: Every day | ORAL | Status: DC

## 2013-09-27 NOTE — Progress Notes (Signed)
[name], female   DOB: 01/24/1934, 79 y.o.   MRN: [id number]   Subjective:   [name] female   DOB: 01/24/1934 79 y.o.   MRN: [id number]  HPI: Christine [name] is a 79 y.o.  Past medical history of migraines, hypertension, coronary artery disease, carotid stenosis, GERD, hypothyroidism, diabetes mellitus type 2, chronic renal failure stage III, hyperlipidemia, and polymyalgia rheumatica.  Presented today with compliants of burning pain in her thighs and legs. She also reports pain in her shoulders and arms. Pain in her thighs worse when she is going up the stairs. She has had several episodes in the past, and says she gets significant relief when she is started on prednisone.She starts getting relief after her first dose of prednisone. Patient just completed a 1 week course of prednisone about a week ago. Patient has been diagnosed with temporal arteritis in the past. No visual changes, no headaches today.   Past Medical History  Diagnosis Date  . GERD (gastroesophageal reflux disease)   . Hypertension   . Hypothyroidism   . Anemia   . Hyperlipidemia   . Sinus arrhythmia   . History of migraine headaches     a. Next atypical symptoms in the past. Patient was started on Neurontin for possible neuropathic origin of her pain.  Marland Kitchen COPD (chronic obstructive pulmonary disease)     a. PFTs 2011 show FEV1/FVC of 96%  . Heart murmur, systolic     2-D echo in December 2011 showed a normal EF with grade 1 diastolic dysfunction, trivial pulmonary regurgitation and mildly elevated PA pressure at 37 mmHg probably secondary to her COPD.dynamic obstruction-mid cavity obliteration;  b. 02/2012 Echo: EF 60-65%, mild LVH, PASP .  Marland Kitchen CAD (coronary artery disease)     a. LHC 08/23/11: dLM 40-50%, oRI 40%, oCFX 40%, oD1 70% (small and not amenable to PCI).  LM lesion did not appear to be flow limiting.  Medical rx was recommended;  b. Echo 08/23/11: mild LVH, EF 60-65%, grade 1  diast dysfxn, mild BAE, PASP 24;  c. 02/2012  Cath: LM 50d, LAD 50p, D1 70ost, RI 70p, RCA ok->Med Rx;  d. 01/2013 Cardiolite: EF 88, no ischemia/infarct.  . Carotid stenosis     a. dopplers 10/12:  0-39% bilat ICA;  b. 10/2012 U/S: 0-39% bilat, f/u 1 yr (10/2013).  . Chronic bronchitis   . Type II diabetes mellitus     a. On oral hypoglycemic agents.  . Blood transfusion 1972    "after daughter born, attempted to give me blood; couldn't give it cause my blood was cold" (09/23/2013)  . History of stomach ulcers 1970's  . Polymyalgia rheumatica   . Diverticulosis of colon (without mention of hemorrhage)   . Benign hypertensive heart and kidney disease with diastolic CHF, NYHA class II and CKD stage III   . Migraines   . Anginal pain   . Shortness of breath on exertion     "just related to angina >1 yr ago" (09/23/2013)  . Arthritis     "back, arms, hips" (09/23/2013)   Current Outpatient Prescriptions  Medication Sig Dispense Refill  . acetaminophen (TYLENOL) 500 MG tablet Take 1,000 mg by mouth every 6 (six) hours as needed for headache. For pain      . albuterol (PROVENTIL HFA;VENTOLIN HFA) 108 (90 BASE) MCG/ACT inhaler Inhale 2 puffs into the lungs every 6 (six) hours as needed for wheezing or shortness of breath.      Marland Kitchen  atorvastatin (LIPITOR) 40 MG tablet Take 40 mg by mouth every evening.      . clopidogrel (PLAVIX) 75 MG tablet Take 75 mg by mouth daily with breakfast.      . esomeprazole (NEXIUM) 40 MG capsule Take 40 mg by mouth every morning.      . Fluticasone-Salmeterol (ADVAIR) 250-50 MCG/DOSE AEPB Inhale 1 puff into the lungs 2 (two) times daily.      Marland Kitchen glimepiride (AMARYL) 1 MG tablet Take 1 mg by mouth daily with breakfast.      . hydroxypropyl methylcellulose (ISOPTO TEARS) 2.5 % ophthalmic solution Place 1 drop into both eyes 4 (four) times daily as needed for dry eyes.      Marland Kitchen imipramine (TOFRANIL) 50 MG tablet Take 50 mg by mouth 2 (two) times daily.      . isosorbide  mononitrate (IMDUR) 60 MG 24 hr tablet Take 120 mg by mouth daily.      Marland Kitchen levothyroxine (SYNTHROID, LEVOTHROID) 25 MCG tablet Take 25 mcg by mouth daily before breakfast.      . loratadine (CLARITIN) 10 MG tablet Take 10 mg by mouth daily as needed for allergies.      . metoprolol succinate (TOPROL-XL) 100 MG 24 hr tablet Take 100 mg by mouth daily. Take with or immediately following a meal.      . Multiple Vitamin (MULITIVITAMIN WITH MINERALS) TABS Take 1 tablet by mouth daily.      . nitroGLYCERIN (NITROSTAT) 0.4 MG SL tablet Place 0.4 mg under the tongue every 5 (five) minutes as needed for chest pain.      . ONE TOUCH ULTRA TEST test strip use as directed  100 each  12  . ONETOUCH DELICA LANCETS 33G MISC use as directed  100 each  6  . predniSONE (DELTASONE) 5 MG tablet Take 1 tablet (5 mg total) by mouth daily with breakfast.  60 tablet  0  . promethazine (PHENERGAN) 12.5 MG tablet Take 12.5 mg by mouth every 6 (six) hours as needed for nausea or vomiting.      . traMADol (ULTRAM) 50 MG tablet Take 50 mg by mouth every 8 (eight) hours as needed (pain).      . valsartan (DIOVAN) 320 MG tablet Take 1 tablet (320 mg total) by mouth daily.  30 tablet  11   No current facility-administered medications for this visit.   Family History  Problem Relation Age of Onset  . Colon cancer Maternal Grandmother     died at 39  . COPD Father     died @ 31  . Other Mother     died of unknown causes in her 49's. Pt raised by grandmother.   History   Social History  . Marital Status: Widowed    Spouse Name: N/A    Number of Children: N/A  . Years of Education: N/A   Social History Main Topics  . Smoking status: Never Smoker   . Smokeless tobacco: Never Used  . Alcohol Use: Yes     Comment: "Last drink 1967"  . Drug Use: No  . Sexual Activity: No   Other Topics Concern  . None   Social History Narrative   Never smoked. Lives in [place] with her dtr.  Care for her daughter who has had a lung  transplant. Retired Ambulance person.   Review of Systems: No pertinent findings on ROS.   Objective:  Physical Exam: Filed Vitals:   09/27/13 1019  BP: 135/75  Pulse:  67  Temp: 97.6 F (36.4 C)  TempSrc: Oral  Height: 5\' 4"  (1.626 m)  Weight: 133 lb 9.6 oz (60.601 kg)  SpO2: 100%   GENERAL- alert, co-operative, appears as stated age, not in any distress. HEENT- Atraumatic, normocephalic, PERRL, EOMI, oral mucosa appears moist, no cervical LN enlargement, thyroid does not appear enlarged. CARDIAC- RRR, no murmurs, rubs or gallops. RESP- Moving equal volumes of air, and clear to auscultation bilaterally. ABDOMEN- Soft, nontender, no palpable masses or organomegaly, bowel sounds present. BACK- Normal curvature of the spine, No tenderness along the vertebrae, no CVA tenderness. NEURO- No obvious Cr N abnormlality, strenght equal and present in all extremities. EXTREMITIES- pulse 2+, symmetric, no pedal edema. SKIN- Warm, dry, No rash or lesion. PSYCH- Normal mood and affect, appropriate thought content and speech.  Assessment & Plan:   The patient's case and plan of care was discussed with attending physician, Dr. Wyvonnia Lora.  Please see problem based chatting for assesment and plan.

## 2013-09-27 NOTE — Patient Instructions (Signed)
We will be starting you on prednisone- 10mg  per day for one month. We will gradually reduce this dose by 1 mg every month. That means you will be getting this medication for 10 months total. So we will start with 10mg  for one month, then reduce to 9 mg for the next month, then 8 mg the 3rd month till we get to 1mg  per month and then we will discontinue the prednisone.  Please let us know if you do not get better. Also call if you are confused about the medication you are to take. We will like to see you back in 4 weeks.

## 2013-09-27 NOTE — Assessment & Plan Note (Signed)
Symptoms seem consistent with another flare of her Polymyalgia rheumatica. Last ESR- 09/23/2013- 35.   Plan- Will start Predisone 10mg  daily today, with slow taper over 10months, to reduce likelihood of recurrence. Treatment regimen will be as follows- 10mg  daily for 1 month, 9mg  daily for 1 month, 8mg  daily for 47month till dose is gradually reduced to 2mg  daily for 1 month and then 1mg  daily for 1 month. - Gave pt a 1 month appointment.The prednisone dose should be adjusted accordingly on her next visit.

## 2013-09-29 NOTE — Progress Notes (Signed)
I saw and evaluated the patient.  I personally confirmed the key portions of Dr. Fredirick Lathe history and exam and reviewed pertinent patient test results.  The assessment, diagnosis, and plan were formulated together and I agree with the documentation in the resident's note.  We will manage her PMR with a slower, but standard, taper of prednisone in hopes of inducing a longer remission.

## 2013-10-04 ENCOUNTER — Encounter: Payer: Medicare Other | Admitting: Cardiology

## 2013-10-06 ENCOUNTER — Other Ambulatory Visit: Payer: Self-pay | Admitting: Internal Medicine

## 2013-10-07 ENCOUNTER — Telehealth: Payer: Self-pay | Admitting: Cardiology

## 2013-10-07 NOTE — Telephone Encounter (Signed)
New Problem  Pt called states that her BP is up- 195/78 and she is having severe headaches// requesting same day appt// please assist

## 2013-10-07 NOTE — Telephone Encounter (Signed)
Spoke with patient. Pt states she had not taken AM medication (metoprolol and Imdur)this morning when she checked her BP. She states early afternoon her BP is in the 140/80 range. I asked pt to go ahead and take her medication this morning. I asked her to check her BP daily about 2 hours after she takes her AM medications and call back in 7-10 days with the readings, call back sooner if BP readings are regularly  elevated a couple hours after taking medications.

## 2013-10-12 ENCOUNTER — Encounter: Payer: Self-pay | Admitting: Physician Assistant

## 2013-10-12 ENCOUNTER — Ambulatory Visit (INDEPENDENT_AMBULATORY_CARE_PROVIDER_SITE_OTHER): Payer: Medicare Other | Admitting: Physician Assistant

## 2013-10-12 VITALS — BP 124/58 | HR 78 | Ht 64.0 in | Wt 132.8 lb

## 2013-10-12 DIAGNOSIS — E785 Hyperlipidemia, unspecified: Secondary | ICD-10-CM | POA: Diagnosis not present

## 2013-10-12 DIAGNOSIS — I10 Essential (primary) hypertension: Secondary | ICD-10-CM | POA: Diagnosis not present

## 2013-10-12 DIAGNOSIS — I2510 Atherosclerotic heart disease of native coronary artery without angina pectoris: Secondary | ICD-10-CM | POA: Diagnosis not present

## 2013-10-12 MED ORDER — METOPROLOL SUCCINATE ER 50 MG PO TB24: 50.0000 mg | ORAL_TABLET | Freq: Two times a day (BID) | ORAL | Status: DC

## 2013-10-12 NOTE — Progress Notes (Signed)
[address] [place], [place]  [address] Phone: [phone] Fax:  [phone]  Date:  10/12/2013   ID:  Christine [name], DOB 01/24/1934, MRN [id number]  PCP:  Debe Coder, MD  Primary Cardiologist:  Dr. Marca Ancona     History of Present Illness: Christine [name] is a 79 y.o. female with a hx of CAD, DM2, HTN, HL, CKD, hypothyroidism, COPD, PMR, ASA allergy.   She was seen in 10/12 with chest pain.  LHC: 40-50% distal left main stenosis that was not thought to be flow-limiting. She additionally had a 75% stenosis in a diagonal. Chest pain resolved with medical therapy. She was admitted again in 4/13 with chest pain and exertional dyspnea. Cardiac enzymes were negative. LHC looked similar to prior with 50% distal LM, 70% ostial D1, and 70% ostial ramus. Echo showed preserved EF. A dobutamine stress echo was a submaximal but did not show evidence for ischemia. No intervention was recommended and medical management was continued.  She had done well with anti-anginals.  However, she saw Dr. Marca Ancona 01/29/13 with c/o's increased CP, dyspnea and fatigue.  Imdur was increased to 120 mg QD.  Lexiscan/ETT-Sestamibi 02/03/13:  EF 88%, no ischemia, normal study.  Last seen by Dr. Marca Ancona in 08/2013.    She was admitted 11/6-11/7 with right arm and chest pain. Pain is worse with internal and external rotation of her shoulder. Cardiac enzymes remained normal. Her ARB was adjusted for high blood pressures.  Her symptoms were felt to be related to PMR and she was asked to follow up with her PCP.  She did f/u with her PCP earlier this month and was placed on a prednisone taper.    She is doing better. Her right arm pain is improved. She denies significant dyspnea. She denies chest pain. She denies syncope. She denies orthopnea, PND or edema. She does note higher blood pressures in the mornings prior to taking her medications. She sometimes has a headache associated with this.  Recent  Labs: 09/23/2013: Hemoglobin 13.4; TSH 2.366  09/24/2013: ALT 19; Creatinine 1.14*; HDL 44; LDL (calc) 86; Potassium 3.7   Wt Readings from Last 3 Encounters:  09/27/13 133 lb 9.6 oz (60.601 kg)  09/24/13 134 lb 14.4 oz (61.19 kg)  09/07/13 135 lb (61.236 kg)     Past Medical History: 1. CAD: Chest pain 10/12. LHC with 40-50% distal LM stenosis, 70-75% ostial D1. No flow-limiting lesions, medically managed. Echo (10/12): EF 60-65%, mild LV hypertrophy, PA systolic pressure 24 mmHg. Chest pain 4/13. LHC with 50% distal LM, 70% ostial D1, 50% proximal LAD, 70% proximal ramus. No flow-limiting lesions, medically managed. Echo (4/13) with EF 60-65%, mild LVH. DSE (4/13) with no ischemia but it was a submaximal study.  Lexiscan/ETT-Sestamibi 02/03/13:  EF 88%, no ischemia, normal study.   2. DM2  3. HTN  4. Hyperlipidemia  5. CKD  6. Carotid dopplers (12/13): 0-39% bilateral carotid stenosis => repeat 1 year  7. Allergy to aspirin => rash.  8. GERD 9. Migraines  10. Hypothyroidism  11. COPD  12. PMR   Current Outpatient Prescriptions  Medication Sig Dispense Refill  . acetaminophen (TYLENOL) 500 MG tablet Take 1,000 mg by mouth every 6 (six) hours as needed for headache. For pain      . ADVAIR DISKUS 250-50 MCG/DOSE AEPB inhale 1 dose by mouth every 12 hours  60 each  2  . albuterol (PROVENTIL HFA;VENTOLIN HFA) 108 (90 BASE) MCG/ACT  inhaler Inhale 2 puffs into the lungs every 6 (six) hours as needed for wheezing or shortness of breath.      Marland Kitchen atorvastatin (LIPITOR) 40 MG tablet Take 40 mg by mouth every evening.      . clopidogrel (PLAVIX) 75 MG tablet Take 75 mg by mouth daily with breakfast.      . esomeprazole (NEXIUM) 40 MG capsule Take 40 mg by mouth every morning.      Marland Kitchen glimepiride (AMARYL) 1 MG tablet Take 1 mg by mouth daily with breakfast.      . hydroxypropyl methylcellulose (ISOPTO TEARS) 2.5 % ophthalmic solution Place 1 drop into both eyes 4 (four) times daily as needed for  dry eyes.      Marland Kitchen imipramine (TOFRANIL) 50 MG tablet Take 50 mg by mouth 2 (two) times daily.      . isosorbide mononitrate (IMDUR) 60 MG 24 hr tablet Take 120 mg by mouth daily.      Marland Kitchen levothyroxine (SYNTHROID, LEVOTHROID) 25 MCG tablet Take 25 mcg by mouth daily before breakfast.      . loratadine (CLARITIN) 10 MG tablet Take 10 mg by mouth daily as needed for allergies.      . metoprolol succinate (TOPROL-XL) 100 MG 24 hr tablet Take 100 mg by mouth daily. Take with or immediately following a meal.      . Multiple Vitamin (MULITIVITAMIN WITH MINERALS) TABS Take 1 tablet by mouth daily.      . nitroGLYCERIN (NITROSTAT) 0.4 MG SL tablet Place 0.4 mg under the tongue every 5 (five) minutes as needed for chest pain.      . ONE TOUCH ULTRA TEST test strip use as directed  100 each  12  . ONETOUCH DELICA LANCETS 33G MISC use as directed  100 each  6  . predniSONE (DELTASONE) 5 MG tablet Take 2 tablets (10 mg total) by mouth daily with breakfast.  60 tablet  0  . promethazine (PHENERGAN) 12.5 MG tablet Take 12.5 mg by mouth every 6 (six) hours as needed for nausea or vomiting.      . traMADol (ULTRAM) 50 MG tablet Take 50 mg by mouth every 8 (eight) hours as needed (pain).      . valsartan (DIOVAN) 320 MG tablet Take 1 tablet (320 mg total) by mouth daily.  30 tablet  11   No current facility-administered medications for this visit.    Allergies:    Allergies  Allergen Reactions  . Aspirin Swelling and Other (See Comments)    REACTION: Angioedema  . Codeine Nausea And Vomiting    "makes me deathly sick"  . Penicillins Nausea And Vomiting    "makes me go to the bathroom real bad"    Social History:  The patient  reports that she has never smoked. She has never used smokeless tobacco. She reports that she drinks alcohol. She reports that she does not use illicit drugs.   ROS:  Please see the history of present illness.    All other systems reviewed and negative.   PHYSICAL EXAM: VS:  BP  124/58  Pulse 78  Ht 5\' 4"  (1.626 m)  Wt 132 lb 12.8 oz (60.238 kg)  BMI 22.78 kg/m2  LMP 02/10/1966 Well nourished, well developed, in no acute distress HEENT: normal Neck: no JVD Cardiac:  normal S1, S2; RRR Lungs:  clear to auscultation bilaterally, no wheezing, rhonchi or rales Abd: soft, nontender, no hepatomegaly Ext: no edema Skin: warm and dry Neuro:  CNs 2-12 intact, no focal abnormalities noted  EKG:  NSR, HR 78, RSR prime, no change from prior tracing  ASSESSMENT AND PLAN:  1. CAD:  Stable. No angina. Continue statin, Plavix, nitrates, beta blocker, ARB. 2. Hypertension:  Controlled. 3. Hyperlipidemia:  Continue statin.  4. Polymyalgia Rheumatica:  Continue followup with primary care. 5. Disposition:  F/u with Dr. Marca Ancona in 02/2014 as planned.  Luna Glasgow, PA-C  10:07 AM 10/12/2013

## 2013-10-12 NOTE — Patient Instructions (Signed)
DECREASE TOPROL, XL TO 50 MG TWICE DAILY;  PLEASE FOLLOW UP WITH DR. Shirlee Latch IN April 2015

## 2013-10-15 ENCOUNTER — Other Ambulatory Visit: Payer: Self-pay

## 2013-10-15 MED ORDER — ISOSORBIDE MONONITRATE ER 60 MG PO TB24: 120.0000 mg | ORAL_TABLET | Freq: Every day | ORAL | Status: DC

## 2013-10-25 ENCOUNTER — Encounter: Payer: Self-pay | Admitting: Internal Medicine

## 2013-10-25 ENCOUNTER — Ambulatory Visit: Payer: Medicare Other | Admitting: Internal Medicine

## 2013-10-29 ENCOUNTER — Other Ambulatory Visit: Payer: Self-pay | Admitting: Internal Medicine

## 2013-11-01 ENCOUNTER — Other Ambulatory Visit: Payer: Medicare Other

## 2013-11-01 ENCOUNTER — Other Ambulatory Visit: Payer: Self-pay | Admitting: Internal Medicine

## 2013-11-01 ENCOUNTER — Other Ambulatory Visit: Payer: Self-pay | Admitting: Cardiology

## 2013-11-01 ENCOUNTER — Telehealth: Payer: Self-pay | Admitting: *Deleted

## 2013-11-01 MED ORDER — PREDNISONE 1 MG PO TABS: 4.0000 mg | ORAL_TABLET | Freq: Every day | ORAL | Status: DC

## 2013-11-01 MED ORDER — PREDNISONE 5 MG PO TABS: 5.0000 mg | ORAL_TABLET | Freq: Every day | ORAL | Status: DC

## 2013-11-01 NOTE — Telephone Encounter (Signed)
Pt was to decrease Prednisone does by 1 mg a month.  The change down to 9 mg should be on 12/10 but refill was for 10 mg.

## 2013-11-01 NOTE — Telephone Encounter (Signed)
I filled her 9mg  daily dose.  She will have 2 Rx, one for 5mg  tabs daily and one for four 1mg  tablets daily to get to the full dose of 9mg  daily for the next month.  Could you please let her know?  Thanks.  The only other way to get her to 9mg  is to take nine 1mg  tabs.    Thanks!

## 2013-11-01 NOTE — Telephone Encounter (Signed)
Pt informed and voices understanding 

## 2013-11-03 ENCOUNTER — Other Ambulatory Visit (INDEPENDENT_AMBULATORY_CARE_PROVIDER_SITE_OTHER): Payer: Medicare Other

## 2013-11-03 DIAGNOSIS — E785 Hyperlipidemia, unspecified: Secondary | ICD-10-CM

## 2013-11-03 DIAGNOSIS — I2510 Atherosclerotic heart disease of native coronary artery without angina pectoris: Secondary | ICD-10-CM

## 2013-11-03 LAB — LIPID PANEL
HDL: 51.4 mg/dL (ref >39.00)
LDL Cholesterol: 75 mg/dL (ref 0–99)
Total CHOL/HDL Ratio: 3
Triglycerides: 136 mg/dL (ref 0.0–149.0)
VLDL: 27.2 mg/dL (ref 0.0–40.0)

## 2013-11-24 ENCOUNTER — Other Ambulatory Visit: Payer: Self-pay | Admitting: Internal Medicine

## 2013-11-24 DIAGNOSIS — Z1231 Encounter for screening mammogram for malignant neoplasm of breast: Secondary | ICD-10-CM

## 2013-11-25 ENCOUNTER — Other Ambulatory Visit: Payer: Self-pay | Admitting: *Deleted

## 2013-11-25 ENCOUNTER — Other Ambulatory Visit: Payer: Self-pay | Admitting: Internal Medicine

## 2013-11-25 MED ORDER — PREDNISONE 1 MG PO TABS: 3.0000 mg | ORAL_TABLET | Freq: Every day | ORAL | Status: DC

## 2013-11-25 MED ORDER — PREDNISONE 5 MG PO TABS: 5.0000 mg | ORAL_TABLET | Freq: Every day | ORAL | Status: DC

## 2013-11-26 NOTE — Telephone Encounter (Signed)
Spoke w/ dr Daryll Drown she has corrected the prednisone

## 2013-12-01 ENCOUNTER — Other Ambulatory Visit: Payer: Self-pay | Admitting: Internal Medicine

## 2013-12-07 ENCOUNTER — Other Ambulatory Visit: Payer: Self-pay | Admitting: Internal Medicine

## 2013-12-08 ENCOUNTER — Ambulatory Visit (HOSPITAL_COMMUNITY)
Admission: RE | Admit: 2013-12-08 | Discharge: 2013-12-08 | Disposition: A | Discharge status: Home / Self Care | Payer: Medicare Other | Source: Ambulatory Visit | Attending: Internal Medicine | Admitting: Internal Medicine

## 2013-12-08 DIAGNOSIS — Z1231 Encounter for screening mammogram for malignant neoplasm of breast: Secondary | ICD-10-CM

## 2013-12-22 ENCOUNTER — Encounter: Payer: Self-pay | Admitting: Internal Medicine

## 2013-12-22 ENCOUNTER — Ambulatory Visit (INDEPENDENT_AMBULATORY_CARE_PROVIDER_SITE_OTHER): Payer: Medicare Other | Admitting: Internal Medicine

## 2013-12-22 VITALS — BP 120/53 | HR 68 | Temp 97.8°F | Ht 64.0 in | Wt 140.8 lb

## 2013-12-22 DIAGNOSIS — I10 Essential (primary) hypertension: Secondary | ICD-10-CM | POA: Diagnosis not present

## 2013-12-22 DIAGNOSIS — R519 Headache, unspecified: Secondary | ICD-10-CM | POA: Insufficient documentation

## 2013-12-22 DIAGNOSIS — E785 Hyperlipidemia, unspecified: Secondary | ICD-10-CM | POA: Diagnosis not present

## 2013-12-22 DIAGNOSIS — R51 Headache: Secondary | ICD-10-CM | POA: Diagnosis not present

## 2013-12-22 DIAGNOSIS — E039 Hypothyroidism, unspecified: Secondary | ICD-10-CM

## 2013-12-22 DIAGNOSIS — M353 Polymyalgia rheumatica: Secondary | ICD-10-CM | POA: Diagnosis not present

## 2013-12-22 DIAGNOSIS — I2510 Atherosclerotic heart disease of native coronary artery without angina pectoris: Secondary | ICD-10-CM | POA: Diagnosis not present

## 2013-12-22 DIAGNOSIS — E119 Type 2 diabetes mellitus without complications: Secondary | ICD-10-CM | POA: Diagnosis not present

## 2013-12-22 LAB — POCT GLYCOSYLATED HEMOGLOBIN (HGB A1C): Hemoglobin A1C: 7.3

## 2013-12-22 LAB — GLUCOSE, CAPILLARY: GLUCOSE-CAPILLARY: 185 mg/dL — AB (ref 70–99)

## 2013-12-22 MED ORDER — ATORVASTATIN CALCIUM 40 MG PO TABS: 40.0000 mg | ORAL_TABLET | Freq: Every evening | ORAL | Status: DC

## 2013-12-22 MED ORDER — DOXYCYCLINE HYCLATE 50 MG PO CAPS: 100.0000 mg | ORAL_CAPSULE | Freq: Two times a day (BID) | ORAL | Status: DC

## 2013-12-22 MED ORDER — SALINE SPRAY 0.65 % NA SOLN: 1.0000 | NASAL | Status: DC | PRN

## 2013-12-22 NOTE — Assessment & Plan Note (Signed)
I believe that patient's headache combined with postnasal drip may represent sinusitis. Patient's symptoms have been ongoing for 3-4 weeks and do not seem to be improving, therefore bacterial sinusitis is a concern. Her headaches do not feel similar to her previous migraine headaches nor do they feel similar to temporal arteritis, which she has had in the past. I will prescribe nasal saline spray. I will also prescribe doxycycline 100 mg twice daily for a seven-day course as she is allergic to penicillin antibiotics. Patient takes Claritin at home and I instructed her to take this daily now at least until her symptoms resolve. Patient continues on a prednisone taper for her polymyalgia rheumatica, which should improve swelling in her sinuses if it is present, therefore do not believe a steroid nasal spray is indicated at this time. I asked patient to followup in clinic if her symptoms do not improve or resolve within a week.

## 2013-12-22 NOTE — Assessment & Plan Note (Addendum)
Lab Results  Component Value Date   HGBA1C 7.3 12/22/2013   HGBA1C 6.4 06/11/2013   HGBA1C 6.3 01/11/2013     Assessment: Diabetes control: good control (HgbA1C at goal) Progress toward A1C goal:  deteriorated Comments: patient's A1c went from 6.4% 05/2013 to 7.3% today. Her diabetic control has deteriorated since July, though remains fairly well controlled. Patient was started on a 10 month prednisone taper in 09/2013, which may be the reason for her worsening diabetic control.   Plan: Medications:  continue current medications Home glucose monitoring: Frequency:   Timing:   Instruction/counseling given: reminded to bring blood glucose meter & log to each visit, reminded to bring medications to each visit, discussed foot care and discussed diet Educational resources provided:   Self management tools provided:   Other plans: Continue current medication regimen (glimepiride 1mg  daily)

## 2013-12-22 NOTE — Assessment & Plan Note (Signed)
BP Readings from Last 3 Encounters:  12/22/13 120/53  10/12/13 124/58  09/27/13 135/75    Lab Results  Component Value Date   NA 135 09/24/2013   K 3.7 09/24/2013   CREATININE 1.14* 09/24/2013    Assessment: Blood pressure control:  yes Progress toward BP goal:   at goal Comments:   Plan: Medications:  continue current medications Educational resources provided:   Self management tools provided:   Other plans: continue IMDUR, Diovan, metoprolol as previously prescribed

## 2013-12-22 NOTE — Patient Instructions (Signed)
Thank you for your visit.   Please continue taking the Claritin daily. I prescribed a nasal spray that you can use in each nostril 1-2 times per day. I also prescribed doxycycline 100mg  to be taken twice per day for at total of 7 days.  Please return to clinic if your symptoms worsen or do not improve within the next week.

## 2013-12-22 NOTE — Assessment & Plan Note (Signed)
I refilled patient's Lipitor today.

## 2013-12-22 NOTE — Progress Notes (Signed)
[name], female   DOB: 01/24/1934, 80 y.o.   MRN: [id number] HPI The patient is a 80 y.o. female with a history of migraines, hypertension, coronary artery disease, carotid stenosis, GERD, hypothyroidism, diabetes mellitus type 2, chronic renal failure stage III, hyperlipidemia, and polymyalgia rheumatica who presents for a routine clinic visit.  PMR: Patient continues on her prednisone taper as she is currently taking 8 mg daily. Patient understands the taper and plans to decrease the amount of prednisone to 7 mg daily on February 10. She has no residual pain to her arms shoulders or hips. Overall, she is tolerating the prednisone well.  HTN: Patient takes IMDUR 120mg  daily, metoprolol 50mg  BID, diovan 320mg  daily. She is compliant with these medications. Patient takes her BP at home and SBP runs 130s-120s usually. Today her blood pressure is 120/53.  Headaches: She reports having a dull, aching bilateral frontal HA daily for the past 3 weeks. The headache seems to come and go, but patient does have headaches on a daily basis. She has history of migraine headaches though this does not feel similar to her migraines. She also has history of temporal arteritis in 2008, however patient does not think her current symptoms are similar to her temporal arteritis symptoms. Denies having aura symptoms. Specifically denies nausea, vomiting, vision changes, pain to b/l temples, jaw pain w/ chewing, and numbness or tingling to her extremities. Patient denies having sore throat, cough, nasal congestion. However she does endorse having postnasal drip over the last 3 weeks that she notices is worst at night when she lies down to rest. No F/C, night sweats, recent weight loss.   ROS: General: no fevers, chills, changes in weight, changes in appetite Skin: no rash HEENT: +post nasal drip, HA; no blurry vision, hearing changes, sore throat Pulm: no dyspnea, coughing, wheezing CV: no chest pain,  palpitations, shortness of breath Abd: no abdominal pain, nausea/vomiting, diarrhea/constipation GU: no dysuria, hematuria, polyuria Ext: no arthralgias, myalgias Neuro: no weakness, numbness, or tingling  Filed Vitals:   12/22/13 0928  BP: 120/53  Pulse: 68  Temp: 97.8 F (36.6 C)   Physical Exam General: elderly woman who is alert, cooperative, and in no apparent distress HEENT: pupils equal round and reactive to light, vision grossly intact, no nasal drainage and nasal turbinates are normal in appearance; oropharynx clear and non-erythematous, no cobble stoning of oropharynx; MMM; mild TTP over ethmoid and frontal sinuses  Neck: supple, no lymphadenopathy Lungs: clear to ascultation bilaterally, normal work of respiration, no wheezes, rales, ronchi Heart: regular rate and rhythm, no murmurs, gallops, or rubs Abdomen: soft, non-tender, non-distended, normal bowel sounds Extremities: warm extremities bilaterally, no pedal edema Neurologic: alert & oriented X3, strength and sensation are grossly intact throughout  Current Outpatient Prescriptions on File Prior to Visit  Medication Sig Dispense Refill  . acetaminophen (TYLENOL) 500 MG tablet Take 1,000 mg by mouth every 6 (six) hours as needed for headache. For pain      . ADVAIR DISKUS 250-50 MCG/DOSE AEPB inhale 1 dose by mouth every 12 hours  60 each  2  . albuterol (PROVENTIL HFA;VENTOLIN HFA) 108 (90 BASE) MCG/ACT inhaler Inhale 2 puffs into the lungs every 6 (six) hours as needed for wheezing or shortness of breath.      Marland Kitchen atorvastatin (LIPITOR) 40 MG tablet Take 40 mg by mouth every evening.      . clopidogrel (PLAVIX) 75 MG tablet Take 75 mg by mouth daily with breakfast.      .  esomeprazole (NEXIUM) 40 MG capsule Take 40 mg by mouth every morning.      Marland Kitchen glimepiride (AMARYL) 1 MG tablet take 1 tablet by mouth once daily before BREAKFAST  30 tablet  3  . hydroxypropyl methylcellulose (ISOPTO TEARS) 2.5 % ophthalmic solution  Place 1 drop into both eyes 4 (four) times daily as needed for dry eyes.      Marland Kitchen imipramine (TOFRANIL) 50 MG tablet Take 50 mg by mouth 2 (two) times daily.      . isosorbide mononitrate (IMDUR) 60 MG 24 hr tablet Take 2 tablets (120 mg total) by mouth daily.  60 tablet  6  . levothyroxine (SYNTHROID, LEVOTHROID) 25 MCG tablet take 1 tablet by mouth once daily  90 tablet  1  . loratadine (CLARITIN) 10 MG tablet take 1 tablet by mouth once daily if needed for allergies  30 tablet  5  . metoprolol succinate (TOPROL-XL) 50 MG 24 hr tablet Take 1 tablet (50 mg total) by mouth 2 (two) times daily.      . Multiple Vitamin (MULITIVITAMIN WITH MINERALS) TABS Take 1 tablet by mouth daily.      . nitroGLYCERIN (NITROSTAT) 0.4 MG SL tablet Place 0.4 mg under the tongue every 5 (five) minutes as needed for chest pain.      . ONE TOUCH ULTRA TEST test strip use as directed  100 each  12  . ONETOUCH DELICA LANCETS 16X MISC use as directed  100 each  6  . predniSONE (DELTASONE) 1 MG tablet Take 3 tablets (3 mg total) by mouth daily with breakfast. Take with 5mg  tab for total of 8mg  daily  90 tablet  0  . predniSONE (DELTASONE) 5 MG tablet Take 1 tablet (5 mg total) by mouth daily with breakfast. Take with 4 one mg tabs for total of 9mg  daily.  30 tablet  0  . promethazine (PHENERGAN) 12.5 MG tablet Take 12.5 mg by mouth every 6 (six) hours as needed for nausea or vomiting.      . traMADol (ULTRAM) 50 MG tablet Take 50 mg by mouth every 8 (eight) hours as needed (pain).      . valsartan (DIOVAN) 320 MG tablet Take 1 tablet (320 mg total) by mouth daily.  30 tablet  11   No current facility-administered medications on file prior to visit.    Assessment/Plan

## 2013-12-23 NOTE — Progress Notes (Signed)
Case discussed with Dr. Mechele Claude at the time of the visit.  We reviewed the resident's history and exam and pertinent patient test results.  I agree with the assessment, diagnosis, and plan of care documented in the resident's note.

## 2013-12-29 ENCOUNTER — Other Ambulatory Visit: Payer: Self-pay | Admitting: Internal Medicine

## 2014-01-05 ENCOUNTER — Other Ambulatory Visit: Payer: Self-pay | Admitting: Internal Medicine

## 2014-01-05 MED ORDER — PREDNISONE 5 MG PO TABS: 5.0000 mg | ORAL_TABLET | Freq: Every day | ORAL | Status: DC

## 2014-01-05 NOTE — Telephone Encounter (Signed)
She should decrease to 7mg  of prednisone daily this month.  I have written the prescriptions to reflect this.    She should see me once she is down around 4-5mg  of prednisone to see how she is doing.  I see she has an appointment on 6/10 which should be about the right time.   Thanks  Gilles Chiquito, MD

## 2014-01-12 ENCOUNTER — Other Ambulatory Visit: Payer: Self-pay | Admitting: Internal Medicine

## 2014-02-01 ENCOUNTER — Telehealth: Payer: Self-pay | Admitting: *Deleted

## 2014-02-01 NOTE — Telephone Encounter (Signed)
Pt called with c/o shoulder pain for over a month.  She states there is a knot on right shoulder, pain is worse when she uses her arm.  The knot is hard and is sore. She is concerned because it didn't go away.  Appointment scheduled for tomorrow AM

## 2014-02-02 ENCOUNTER — Encounter: Payer: Self-pay | Admitting: Internal Medicine

## 2014-02-02 ENCOUNTER — Ambulatory Visit (INDEPENDENT_AMBULATORY_CARE_PROVIDER_SITE_OTHER): Payer: Medicare Other | Admitting: Internal Medicine

## 2014-02-02 ENCOUNTER — Ambulatory Visit (HOSPITAL_COMMUNITY)
Admission: RE | Admit: 2014-02-02 | Discharge: 2014-02-02 | Disposition: A | Discharge status: Home / Self Care | Payer: Medicare Other | Source: Ambulatory Visit | Attending: Internal Medicine | Admitting: Internal Medicine

## 2014-02-02 VITALS — BP 152/69 | HR 66 | Temp 96.9°F | Ht 64.0 in

## 2014-02-02 DIAGNOSIS — M898X9 Other specified disorders of bone, unspecified site: Secondary | ICD-10-CM

## 2014-02-02 DIAGNOSIS — N183 Chronic kidney disease, stage 3 unspecified: Secondary | ICD-10-CM

## 2014-02-02 DIAGNOSIS — M25519 Pain in unspecified shoulder: Secondary | ICD-10-CM | POA: Diagnosis not present

## 2014-02-02 DIAGNOSIS — R350 Frequency of micturition: Secondary | ICD-10-CM | POA: Diagnosis not present

## 2014-02-02 DIAGNOSIS — S43006A Unspecified dislocation of unspecified shoulder joint, initial encounter: Secondary | ICD-10-CM | POA: Diagnosis not present

## 2014-02-02 DIAGNOSIS — S43011A Anterior subluxation of right humerus, initial encounter: Secondary | ICD-10-CM

## 2014-02-02 DIAGNOSIS — M25511 Pain in right shoulder: Secondary | ICD-10-CM

## 2014-02-02 DIAGNOSIS — M7580 Other shoulder lesions, unspecified shoulder: Secondary | ICD-10-CM

## 2014-02-02 DIAGNOSIS — M353 Polymyalgia rheumatica: Secondary | ICD-10-CM

## 2014-02-02 DIAGNOSIS — M779 Enthesopathy, unspecified: Secondary | ICD-10-CM

## 2014-02-02 LAB — POCT URINALYSIS DIPSTICK
Bilirubin, UA: NEGATIVE
Glucose, UA: NEGATIVE
Ketones, UA: NEGATIVE
Nitrite, UA: NEGATIVE
PROTEIN UA: NEGATIVE
Spec Grav, UA: <=1.005
Urobilinogen, UA: 0.2
pH, UA: 5.5

## 2014-02-02 NOTE — Patient Instructions (Addendum)
We will test your urine for infection and call you with the results tomorrow. If you need an antibiotic, we will send it to your pharmacy.  For your shoulder pain, we will get an XRay of the shoulder. It looks like another bone spur similar to the one on your chest.  We will refer you to the rheumatologist as discussed previously for your PMR.  Please bring your medicines with you each time you come.   Medicines may be  Eye drops  Herbal   Vitamins  Pills  Seeing these help [place] take care of you.   Polymyalgia Rheumatica Polymyalgia rheumatica (also called PMR or polymyalgia) is a rheumatologic (arthritic) condition that causes pain and morning stiffness in your neck, shoulders, and hips. It is an inflammatory condition. In some people, inflammation of certain structures in the shoulder, hips, or other joints can be seen on special testing. It does not cause joint destruction, as occurs in other arthritic conditions. It usually occurs after 78 years of age, and is more common as you age. It can be confused with several other diseases, but it is usually easily treated. People with PMR often have, or can develop, a more severe rheumatologic condition called giant cell arteritis (also called CGA or temporal arteritis).  CAUSES  The exact cause of PMR is not known.   There are genetic factors involved.  Viruses have been suspected in the cause of PMR. This has not been proven. SYMPTOMS   Aching, pain, and morning stiffness your neck, both shoulders, or both hips.  Symptoms usually start slowly and build gradually.  Morning stiffness usually lasts at least 30 minutes.  Swelling and tenderness in other joints of the arms, hands, legs, and feet may occur.  Swelling and inflammation in the wrists can cause nerve inflammation at the wrist (carpal tunnel syndrome).  You may also have low grade fever, fatigue, weakness, decreased appetite and weight loss. DIAGNOSIS   Your caregiver  may suspect that you have PMR based on your description of your symptoms and on your exam.  Your caregiver will examine you to be sure you do not have diseases that can be confused with PMR. These diseases include rheumatoid arthritis, fibromyalgia, or thyroid disease.  Your caregiver should check for signs of giant cell arteritis. This can cause serious complications such as blindness.  Lab tests can help confirm that you have PMR and not other diseases, but are sometimes inconclusive.  X-rays cannot show PMR. However, it can identify other diseases like rheumatoid arthritis. Your caregiver may have you see a specialist in arthritis and inflammatory diseases (rheumatologist). TREATMENT  The goal of treatment is relief of symptoms. Treatment does not shorten the course of the illness or prevent complications. With proper treatment, you usually feel better almost right away.   The initial treatment of PMR is usually a cortisone (steroid) medication. Your caregiver will help determine a starting dose. The dose is gradually reduced every few weeks to months. Treatment usually lasts one to three years.  Other stronger medications are rarely needed. They will only be prescribed if your symptoms do not get better on cortisone medication alone, or if they recur as the dose is reduced.  Cortisone medication can have different side effects. With the doses of cortisone needed for PMR, the side effects can affect bones and joints, blood sugar control in diabetes, and mood changes. Discuss this with your caregiver.  Your caregiver will evaluate you regularly during your treatment. They will do  this in order to assess progress and to check for complications of the illness or treatment.  Physical therapy is sometimes useful. This is especially true if your joints are still stiff after other symptoms have improved. HOME CARE INSTRUCTIONS   Follow your caregiver's instructions. Do not change your dose of  cortisone medication on your own.  Keep your appointments for follow-up lab tests and caregiver visits. Your lab tests need to be monitored. You must get checked periodically for giant cell arteritis.  Follow your caregiver's guidance regarding physical activity (usually no restrictions are needed) or physical therapy.  Your caregiver may have instructions to prevent or check for side effects from cortisone medication (including bone density testing or treatment). Follow their instructions carefully. SEEK MEDICAL CARE IF:   You develop any side effects from treatment. Side effects can include:  Elevated blood pressure.  High blood sugar (or worsening of diabetes, if you are diabetic).  Difficulty fighting off infections.  Weight gain.  Weakness of the bones (osteoporosis).  Your aches, pains, morning stiffness, or other symptoms get worse with time. This is especially true after your dose of cortisone is reduced.  You develop new joint symptoms (pain, swelling, etc.) SEEK IMMEDIATE MEDICAL CARE IF:   You develop a severe headache.  You start vomiting.  You have problems with your vision.  You have an oral temperature above 102 F (38.9 C), not controlled by medicine. Document Released: 12/12/2004 Document Revised: 10/21/2012 Document Reviewed: 03/27/2009 [facility] Patient Information 2014 [place], [place].

## 2014-02-02 NOTE — Assessment & Plan Note (Signed)
Check dipstick

## 2014-02-02 NOTE — Progress Notes (Signed)
   Subjective:    [name], female    DOB: 01/24/1934, 80 y.o.   MRN: [id number]  HPI  Pt presents with complaint of frequent urination for the past 3 weeks. Thinks that she may have a "UTI". States that she normally drinks cranberry juice which "takes care of it" but it doesn't seem to be resolving this time. No burning, no strong smell, no discharge or irritation. Also reports a new "knot" on her right shoulder similar to a knot that is near her right sternoclavicular joint which was evaluated and deemed secondary to arthritis. This new knot is irritated by her bra strap.  States that it "came up" over the past 5 weeks or so.  Her hx is significant for chronic renal insufficiency with baseline creatinine ~1, DM, hypertension, and prior trauma to the right shoulder secondary to a fall in 2011 and 2013. No fractures demonstrated on XRays.  Review of Systems  Constitutional: Negative for fever, chills and fatigue.  HENT: Negative.   Eyes: Negative.   Respiratory: Negative.   Cardiovascular: Negative.   Gastrointestinal: Negative.   Genitourinary: Positive for frequency.  Musculoskeletal:       Right shoulder with "knot on it"  Skin: Negative.   Neurological: Negative.   Hematological: Does not bruise/bleed easily.  Psychiatric/Behavioral: Negative.        Objective:   Physical Exam  Constitutional: She is oriented to person, place, and time. She appears well-developed and well-nourished. No distress.  HENT:  Head: Normocephalic and atraumatic.  Eyes: Conjunctivae and EOM are normal. Pupils are equal, round, and reactive to light.  Neck: Normal range of motion.  Cardiovascular: Normal rate, regular rhythm, normal heart sounds and intact distal pulses.   Pulmonary/Chest: Effort normal and breath sounds normal.  Abdominal: Soft. Bowel sounds are normal. There is no tenderness.  Musculoskeletal: She exhibits no edema and no tenderness.       Arms: Neurological: She is  alert and oriented to person, place, and time.  Skin: Skin is warm and dry.  Psychiatric: She has a normal mood and affect.          Assessment & Plan:  See separate problem based listing:

## 2014-02-03 DIAGNOSIS — S43011A Anterior subluxation of right humerus, initial encounter: Secondary | ICD-10-CM | POA: Insufficient documentation

## 2014-02-03 LAB — URINE CULTURE
COLONY COUNT: NO GROWTH
ORGANISM ID, BACTERIA: NO GROWTH

## 2014-02-03 LAB — URINALYSIS, ROUTINE W REFLEX MICROSCOPIC
Bilirubin Urine: NEGATIVE
GLUCOSE, UA: NEGATIVE mg/dL
HGB URINE DIPSTICK: NEGATIVE
Ketones, ur: NEGATIVE mg/dL
LEUKOCYTES UA: NEGATIVE
Nitrite: NEGATIVE
Protein, ur: NEGATIVE mg/dL
Specific Gravity, Urine: <1.005 — ABNORMAL LOW (ref 1.005–1.030)
Urobilinogen, UA: 0.2 mg/dL (ref 0.0–1.0)
pH: 6 (ref 5.0–8.0)

## 2014-02-03 NOTE — Progress Notes (Signed)
Pt aware of appt for MRI right shoulder WL 02/15/14 9AM [facility] - no restrictions. Hilda Blades Mcihael Hinderman RN 02/03/14 4:20PM

## 2014-02-03 NOTE — Assessment & Plan Note (Addendum)
Pt with complaint of right shoulder pain at area of bony mass. She has good range of motion at the right shoulder and does not endorse pain with ROM. The bony mass is quite impressive and given prior XRay Jan 2013 after fall and subsequent shoulder pain that did NOT indicate fracture or subluxation but did demonstrate bone spur of the acromion process, we will obtain XR of the shoulder.  Addendum: Subluxation of the patient superior subluxation of the humeral head  raising concern of rotator cuff injury. Increased hypertrophy of the  bone spurs involving the humeral head and acromion identified.  Interval development of peripheral bone spurs involve the glenoid.  Considering the superior subluxation of humeral head further  evaluation with MRI is recommended.  -Will contact pt with results and order MRI shoulder without contrast given chronic renal insufficiency.

## 2014-02-03 NOTE — Assessment & Plan Note (Signed)
Appears relatively stable.  Last check Cr 1.18 Sep 2013.  Will have pt get recheck of Creatinine especially if contrast deemed necessary.

## 2014-02-04 ENCOUNTER — Other Ambulatory Visit: Payer: Self-pay | Admitting: Internal Medicine

## 2014-02-04 MED ORDER — PREDNISONE 2 MG PO TBEC: 6.0000 mg | DELAYED_RELEASE_TABLET | Freq: Every day | ORAL | Status: DC

## 2014-02-04 NOTE — Progress Notes (Signed)
Case discussed with Dr. Schooler soon after the resident saw the patient.  We reviewed the resident's history and exam and pertinent patient test results.  I agree with the assessment, diagnosis, and plan of care documented in the resident's note. 

## 2014-02-08 ENCOUNTER — Telehealth: Payer: Self-pay | Admitting: *Deleted

## 2014-02-08 NOTE — Telephone Encounter (Signed)
Talked with pt on 02/07/14 about dosage on Prednisone. Pt states decreasing dosage every month - now on Prednisone 6 mg till mid April. Pt aware Rx is at pharmacy for pickup - Prednisone 2mg  #90 - pt aware she will take 3 tablets daily - each one is 2mg  x 3 tablet will equal 6 mg. Talked with Dr Lynnae January. Hilda Blades Makara Lanzo RN 02/08/14 10AM

## 2014-02-08 NOTE — Telephone Encounter (Signed)
Pt called 2:15PM and upset about name being used for Prednisone - pharmacy states due to manufacture for 2 mg. Talked with Dr Lynnae January decided to change back to Prednisone 5mg  #30 - one tablet every day and Prednisone 1mg  #30 - one tablet every day to equal 6 mg every day. Hilda Blades Ciarrah [name] RN 02/08/14 3PM

## 2014-02-14 ENCOUNTER — Telehealth: Payer: Self-pay | Admitting: *Deleted

## 2014-02-14 ENCOUNTER — Other Ambulatory Visit: Payer: Self-pay | Admitting: Gastroenterology

## 2014-02-14 NOTE — Telephone Encounter (Signed)
I have spoken to patient and advised that since Dr Sharlett Iles has retired, she will need to establish care with another one of our physicians for further refills. She does not have a preference. Doc of the day is Dr Deatra Ina. Therefore, she has been scheduled for an appointment with Dr Deatra Ina for routine follow up of GERD and medication refills on 03/31/14. She verbalizes understanding. Dr Deatra Ina, may I refill her Nexium until her appointment with you in May?

## 2014-02-14 NOTE — Telephone Encounter (Signed)
yes

## 2014-02-14 NOTE — Telephone Encounter (Signed)
Returned pt's call - pt c/o's feet/leg pain esp when walking; stated her Prednisone dosage has been decreased to low from 10mg  to 6mg . Informed pt is will be best to discussed this w/someone; an appt has been scheduled on Thursday.

## 2014-02-14 NOTE — Telephone Encounter (Signed)
Agree with appt Thanks 

## 2014-02-15 ENCOUNTER — Other Ambulatory Visit: Payer: Self-pay | Admitting: Internal Medicine

## 2014-02-15 ENCOUNTER — Ambulatory Visit (HOSPITAL_COMMUNITY)
Admission: RE | Admit: 2014-02-15 | Discharge: 2014-02-15 | Disposition: A | Discharge status: Home / Self Care | Payer: Medicare Other | Source: Ambulatory Visit | Attending: Internal Medicine | Admitting: Internal Medicine

## 2014-02-15 DIAGNOSIS — S43011A Anterior subluxation of right humerus, initial encounter: Secondary | ICD-10-CM

## 2014-02-15 DIAGNOSIS — S43429A Sprain of unspecified rotator cuff capsule, initial encounter: Secondary | ICD-10-CM | POA: Insufficient documentation

## 2014-02-15 DIAGNOSIS — M6740 Ganglion, unspecified site: Secondary | ICD-10-CM | POA: Diagnosis not present

## 2014-02-15 DIAGNOSIS — M25519 Pain in unspecified shoulder: Secondary | ICD-10-CM | POA: Diagnosis not present

## 2014-02-15 DIAGNOSIS — M7580 Other shoulder lesions, unspecified shoulder: Secondary | ICD-10-CM

## 2014-02-15 DIAGNOSIS — M25511 Pain in right shoulder: Secondary | ICD-10-CM

## 2014-02-15 DIAGNOSIS — X58XXXA Exposure to other specified factors, initial encounter: Secondary | ICD-10-CM | POA: Insufficient documentation

## 2014-02-15 DIAGNOSIS — M19019 Primary osteoarthritis, unspecified shoulder: Secondary | ICD-10-CM | POA: Diagnosis not present

## 2014-02-15 DIAGNOSIS — M259 Joint disorder, unspecified: Secondary | ICD-10-CM | POA: Insufficient documentation

## 2014-02-15 DIAGNOSIS — M779 Enthesopathy, unspecified: Secondary | ICD-10-CM

## 2014-02-17 ENCOUNTER — Encounter: Payer: Self-pay | Admitting: Internal Medicine

## 2014-02-17 ENCOUNTER — Ambulatory Visit (INDEPENDENT_AMBULATORY_CARE_PROVIDER_SITE_OTHER): Payer: Medicare Other | Admitting: Internal Medicine

## 2014-02-17 ENCOUNTER — Ambulatory Visit: Payer: Medicare Other | Admitting: Internal Medicine

## 2014-02-17 VITALS — BP 126/61 | HR 73 | Temp 98.2°F | Ht 64.0 in | Wt 139.3 lb

## 2014-02-17 DIAGNOSIS — E119 Type 2 diabetes mellitus without complications: Secondary | ICD-10-CM

## 2014-02-17 DIAGNOSIS — G5600 Carpal tunnel syndrome, unspecified upper limb: Secondary | ICD-10-CM | POA: Diagnosis not present

## 2014-02-17 DIAGNOSIS — M25519 Pain in unspecified shoulder: Secondary | ICD-10-CM

## 2014-02-17 DIAGNOSIS — G5602 Carpal tunnel syndrome, left upper limb: Secondary | ICD-10-CM

## 2014-02-17 DIAGNOSIS — I739 Peripheral vascular disease, unspecified: Secondary | ICD-10-CM

## 2014-02-17 DIAGNOSIS — M353 Polymyalgia rheumatica: Secondary | ICD-10-CM | POA: Diagnosis not present

## 2014-02-17 DIAGNOSIS — M25511 Pain in right shoulder: Secondary | ICD-10-CM

## 2014-02-17 LAB — GLUCOSE, CAPILLARY: Glucose-Capillary: 187 mg/dL — ABNORMAL HIGH (ref 70–99)

## 2014-02-17 MED ORDER — PREDNISONE 5 MG PO TABS: 5.0000 mg | ORAL_TABLET | Freq: Every day | ORAL | Status: DC

## 2014-02-17 MED ORDER — PREDNISONE 1 MG PO TABS: 2.0000 mg | ORAL_TABLET | Freq: Every day | ORAL | Status: DC

## 2014-02-17 NOTE — Patient Instructions (Signed)
-  Start taking prednisone 7mg  daily  -Keep your appointment with your Rheumatologist -Keep your appointment with your Orthopedic Surgeon -It was great meeting you!

## 2014-02-17 NOTE — Progress Notes (Signed)
   Subjective:    [name] [name], female    DOB: 01/24/1934, 80 y.o.   MRN: [id number]  HPI [name] is a pleasant 80 yr old woman with with PMH significant for PMR, DM2, CAD,  who presents for visit for evaluation of bilateral feet and leg pain. She was started on prednisone taper back in November starting at 10mg  daily and has gradually been tapering down her dose with last dose decrease on 3/18 to 6mg  daily. She states that since 3/18 the pain, which is described as a burning discomfort, has gradually worsened. She also complains of pain in her left wrist which she attributes to carpal tunnel syndrome.  She explains that the pain in her feet and legs are worsened with walking, allowing her to walk only short distances at a time. The pain goes up to her mid calves and completely goes away with rest. She states that this pain had resolved and was not present while she was taking prednisone 7mg  daily.  She denies headache or vision changes.  She also complains of right shoulder pain that is unchanged since she as last seen that the [facility].   Review of Systems  Constitutional: Negative for fever, chills, diaphoresis, activity change, appetite change, fatigue and unexpected weight change.  Respiratory: Negative for cough, shortness of breath and wheezing.   Cardiovascular: Negative for chest pain, palpitations and leg swelling.  Gastrointestinal: Negative for abdominal pain, diarrhea and constipation.  Genitourinary: Negative for dysuria.  Musculoskeletal: Positive for myalgias.  Skin: Negative for color change, pallor and rash.  Neurological: Negative for dizziness, tremors, weakness, light-headedness, numbness and headaches.  Hematological: Negative for adenopathy.  Psychiatric/Behavioral: Negative for agitation. The patient is not nervous/anxious.        Objective:   Physical Exam  Nursing note and vitals reviewed. Constitutional: She is oriented to person, place, and time. She  appears well-developed and well-nourished. No distress.  HENT:  Head: Normocephalic.  Negative for temporal tenderness bilaterally  Eyes: Conjunctivae are normal.  Cardiovascular: Normal rate and regular rhythm.   Pulmonary/Chest: Effort normal and breath sounds normal. No respiratory distress. She has no wheezes. She has no rales.  Abdominal: Soft. There is no tenderness.  Musculoskeletal: She exhibits tenderness. She exhibits no edema.  Negative Tinel's signs bilaterally, positive Phalen's maneuver bilaterally, worse on the Left.  Right shoulder with palpable mass. Speed's test and Yergason's test positive.  DP pulses 1+ bilaterally.   Neurological: She is alert and oriented to person, place, and time. She exhibits normal muscle tone.  Grip strength 5/5 bilaterally. Sensation intact in bilateral UE and LE.   Skin: Skin is warm and dry. No rash noted. She is not diaphoretic. No erythema. No pallor.  No cyanosis  Psychiatric: She has a normal mood and affect. Her behavior is normal.          Assessment & Plan:

## 2014-02-18 DIAGNOSIS — G5600 Carpal tunnel syndrome, unspecified upper limb: Secondary | ICD-10-CM | POA: Insufficient documentation

## 2014-02-18 NOTE — Assessment & Plan Note (Signed)
She states that her pain is well controlled with Tylenol. She has an appointment with Orthopedic Surgery (Dr. Onnie Graham) on April 20th.

## 2014-02-18 NOTE — Assessment & Plan Note (Addendum)
Phalen's maneuver positive bilaterally. Pt has right wrist splint but not for the left wrist.  -Rx for left wrist splint given

## 2014-02-18 NOTE — Assessment & Plan Note (Signed)
Thought a formal diagnosis of PMR has not been established for this patient, she reports improvement of her symptoms with prednisone with dose as low as 7mg  daily. Given that her bilateral feet and lower calves pain returned the day after her dose was decreased to 6mg  daily, her dose will be increased back to 7mg  daily. She has been referred to Rheumatology but has not been accepted at a practice yet with no set appointment date.  The description of her bilateral feet and calves pain is concerning for claudication and ABIs will be ordered.  -Rx prednisone 5mg  #30 and prednisone 1mg  #60 for a total dose of 7mg  daily (the pt does not want to try the prednisone 2mg  tablets as they are generic, with a different name) -Ordered bilateral ABIs.  -Pt to keep referral to Rheumatology -Pt to follow up in one month

## 2014-02-21 ENCOUNTER — Encounter (HOSPITAL_COMMUNITY): Payer: Medicare Other

## 2014-02-22 ENCOUNTER — Ambulatory Visit (HOSPITAL_COMMUNITY)
Admission: RE | Admit: 2014-02-22 | Discharge: 2014-02-22 | Disposition: A | Discharge status: Home / Self Care | Payer: Medicare Other | Source: Ambulatory Visit | Attending: Internal Medicine | Admitting: Internal Medicine

## 2014-02-22 DIAGNOSIS — I739 Peripheral vascular disease, unspecified: Secondary | ICD-10-CM | POA: Insufficient documentation

## 2014-02-22 DIAGNOSIS — E119 Type 2 diabetes mellitus without complications: Secondary | ICD-10-CM | POA: Diagnosis not present

## 2014-02-22 DIAGNOSIS — M79609 Pain in unspecified limb: Secondary | ICD-10-CM | POA: Diagnosis not present

## 2014-02-22 NOTE — Progress Notes (Signed)
ABI completed   Najeh Credit, RVT

## 2014-02-23 NOTE — Progress Notes (Signed)
Case discussed with Dr. Kennerly at the time of the visit.  We reviewed the resident's history and exam and pertinent patient test results.  I agree with the assessment, diagnosis, and plan of care documented in the resident's note. 

## 2014-03-07 DIAGNOSIS — M12519 Traumatic arthropathy, unspecified shoulder: Secondary | ICD-10-CM | POA: Diagnosis not present

## 2014-03-09 ENCOUNTER — Other Ambulatory Visit: Payer: Self-pay | Admitting: *Deleted

## 2014-03-09 MED ORDER — GLIMEPIRIDE 1 MG PO TABS: ORAL_TABLET | ORAL | Status: DC

## 2014-03-10 ENCOUNTER — Other Ambulatory Visit: Payer: Self-pay | Admitting: Internal Medicine

## 2014-03-10 DIAGNOSIS — E785 Hyperlipidemia, unspecified: Secondary | ICD-10-CM

## 2014-03-10 MED ORDER — ATORVASTATIN CALCIUM 40 MG PO TABS: 40.0000 mg | ORAL_TABLET | Freq: Every evening | ORAL | Status: DC

## 2014-03-14 ENCOUNTER — Other Ambulatory Visit: Payer: Self-pay | Admitting: *Deleted

## 2014-03-15 ENCOUNTER — Encounter: Payer: Self-pay | Admitting: Internal Medicine

## 2014-03-15 ENCOUNTER — Ambulatory Visit (INDEPENDENT_AMBULATORY_CARE_PROVIDER_SITE_OTHER): Payer: Medicare Other | Admitting: Internal Medicine

## 2014-03-15 ENCOUNTER — Other Ambulatory Visit: Payer: Medicare Other

## 2014-03-15 VITALS — BP 140/67 | HR 67 | Temp 98.1°F | Ht 64.0 in | Wt 139.5 lb

## 2014-03-15 DIAGNOSIS — M25519 Pain in unspecified shoulder: Secondary | ICD-10-CM | POA: Diagnosis not present

## 2014-03-15 DIAGNOSIS — M353 Polymyalgia rheumatica: Secondary | ICD-10-CM

## 2014-03-15 DIAGNOSIS — E119 Type 2 diabetes mellitus without complications: Secondary | ICD-10-CM | POA: Diagnosis not present

## 2014-03-15 DIAGNOSIS — G5600 Carpal tunnel syndrome, unspecified upper limb: Secondary | ICD-10-CM | POA: Diagnosis not present

## 2014-03-15 DIAGNOSIS — N183 Chronic kidney disease, stage 3 unspecified: Secondary | ICD-10-CM | POA: Diagnosis not present

## 2014-03-15 LAB — BASIC METABOLIC PANEL
BUN: 14 mg/dL (ref 6–23)
CO2: 29 meq/L (ref 19–32)
CREATININE: 1.04 mg/dL (ref 0.50–1.10)
Calcium: 9.3 mg/dL (ref 8.4–10.5)
Chloride: 101 mEq/L (ref 96–112)
GLUCOSE: 127 mg/dL — AB (ref 70–99)
Potassium: 3.7 mEq/L (ref 3.5–5.3)
SODIUM: 137 meq/L (ref 135–145)

## 2014-03-15 MED ORDER — PREDNISONE 5 MG PO TABS: 10.0000 mg | ORAL_TABLET | Freq: Every day | ORAL | Status: DC

## 2014-03-15 NOTE — Progress Notes (Signed)
Subjective:   [name] female   DOB: 01/24/1934 80 y.o.   MRN: [id number]  HPI: Christine [name] is a 80 y.o. woman with PMH significant for CAD, HTN, DM-II, HLD, PMR on chronic prednisone therapy comes to the office with CC of pain in both thighs, legs, feet for the last few weeks.  Patient reports that she was taking Prednisone 8-10 mg before when she was without these pains. She states that her symptoms recurred after her prednisone is reduced to 7 mg per a day. Patient is here today in the office requesting to go up on her prednisone. Patient reports that walking aggravates her pain and that she has been lying on the bed all day for the last couple of weeks.Patient states that she has an appointment with Rheumatology on May 28th and that this is her first appointment with Rheumatology.  Patient denies any other complaints during this office visit.   Past Medical History  Diagnosis Date  . GERD (gastroesophageal reflux disease)   . Hypertension   . Hypothyroidism   . Anemia   . Hyperlipidemia   . Sinus arrhythmia   . History of migraine headaches     a. Next atypical symptoms in the past. Patient was started on Neurontin for possible neuropathic origin of her pain.  Marland Kitchen COPD (chronic obstructive pulmonary disease)     a. PFTs 2011 show FEV1/FVC of 96%  . Heart murmur, systolic     2-D echo in December 2011 showed a normal EF with grade 1 diastolic dysfunction, trivial pulmonary regurgitation and mildly elevated PA pressure at 37 mmHg probably secondary to her COPD.dynamic obstruction-mid cavity obliteration;  b. 02/2012 Echo: EF 60-65%, mild LVH, PASP 39mmHg.  Marland Kitchen CAD (coronary artery disease)     a. LHC 08/23/11: dLM 40-50%, oRI 40%, oCFX 40%, oD1 70% (small and not amenable to PCI).  LM lesion did not appear to be flow limiting.  Medical rx was recommended;  b. Echo 08/23/11: mild LVH, EF 60-65%, grade 1 diast dysfxn, mild BAE, PASP 24;  c. 02/2012  Cath: LM 50d, LAD 50p,  D1 70ost, RI 70p, RCA ok->Med Rx;  d. 01/2013 Cardiolite: EF 88, no ischemia/infarct.  . Carotid stenosis     a. dopplers 10/12:  0-39% bilat ICA;  b. 10/2012 U/S: 0-39% bilat, f/u 1 yr (10/2013).  . Chronic bronchitis   . Type II diabetes mellitus     a. On oral hypoglycemic agents.  . Blood transfusion 1972    "after daughter born, attempted to give me blood; couldn't give it cause my blood was cold" (09/23/2013)  . History of stomach ulcers 1970's  . Polymyalgia rheumatica   . Diverticulosis of colon (without mention of hemorrhage)   . Benign hypertensive heart and kidney disease with diastolic CHF, NYHA class II and CKD stage III   . Migraines   . Anginal pain   . Shortness of breath on exertion     "just related to angina >1 yr ago" (09/23/2013)  . Arthritis     "back, arms, hips" (09/23/2013)   Current Outpatient Prescriptions  Medication Sig Dispense Refill  . acetaminophen (TYLENOL) 500 MG tablet Take 1,000 mg by mouth every 6 (six) hours as needed for headache. For pain      . ADVAIR DISKUS 250-50 MCG/DOSE AEPB inhale 1 dose by mouth every 12 hours  60 each  0  . albuterol (PROVENTIL HFA;VENTOLIN HFA) 108 (90 BASE) MCG/ACT inhaler Inhale 2  puffs into the lungs every 6 (six) hours as needed for wheezing or shortness of breath.      Marland Kitchen atorvastatin (LIPITOR) 40 MG tablet Take 1 tablet (40 mg total) by mouth every evening.  90 tablet  3  . clopidogrel (PLAVIX) 75 MG tablet take 1 tablet by mouth once daily  30 tablet  5  . esomeprazole (NEXIUM) 40 MG capsule take 1 capsule by mouth once daily BEFORE BREAKFAST  30 capsule  0  . glimepiride (AMARYL) 1 MG tablet take 1 tablet by mouth once daily before BREAKFAST  30 tablet  3  . hydroxypropyl methylcellulose (ISOPTO TEARS) 2.5 % ophthalmic solution Place 1 drop into both eyes 4 (four) times daily as needed for dry eyes.      Marland Kitchen imipramine (TOFRANIL) 50 MG tablet take 1 tablet by mouth twice a day  60 tablet  0  . isosorbide mononitrate  (IMDUR) 60 MG 24 hr tablet Take 2 tablets (120 mg total) by mouth daily.  60 tablet  6  . levothyroxine (SYNTHROID, LEVOTHROID) 25 MCG tablet take 1 tablet by mouth once daily  90 tablet  1  . loratadine (CLARITIN) 10 MG tablet take 1 tablet by mouth once daily if needed for allergies  30 tablet  5  . metoprolol succinate (TOPROL-XL) 50 MG 24 hr tablet Take 1 tablet (50 mg total) by mouth 2 (two) times daily.      . Multiple Vitamin (MULITIVITAMIN WITH MINERALS) TABS Take 1 tablet by mouth daily.      . nitroGLYCERIN (NITROSTAT) 0.4 MG SL tablet Place 0.4 mg under the tongue every 5 (five) minutes as needed for chest pain.      . ONE TOUCH ULTRA TEST test strip use as directed  100 each  12  . ONETOUCH DELICA LANCETS 16X MISC use as directed  100 each  6  . predniSONE (DELTASONE) 5 MG tablet Take 2 tablets (10 mg total) by mouth daily with breakfast.  60 tablet  0  . promethazine (PHENERGAN) 12.5 MG tablet Take 12.5 mg by mouth every 6 (six) hours as needed for nausea or vomiting.      . sodium chloride (OCEAN) 0.65 % SOLN nasal spray Place 1 spray into both nostrils as needed for congestion.  1 Bottle  0  . traMADol (ULTRAM) 50 MG tablet Take 50 mg by mouth every 8 (eight) hours as needed (pain).      . valsartan (DIOVAN) 320 MG tablet Take 1 tablet (320 mg total) by mouth daily.  30 tablet  11   No current facility-administered medications for this visit.   Family History  Problem Relation Age of Onset  . Colon cancer Maternal Grandmother     died at 44  . COPD Father     died @ 35  . Other Mother     died of unknown causes in her 17's. Pt raised by grandmother.   History   Social History  . Marital Status: Widowed    Spouse Name: N/A    Number of Children: N/A  . Years of Education: N/A   Social History Main Topics  . Smoking status: Never Smoker   . Smokeless tobacco: Never Used  . Alcohol Use: No     Comment: "Last drink 1967"  . Drug Use: No  . Sexual Activity: None    Other Topics Concern  . None   Social History Narrative   Never smoked. Lives in [place] with her dtr.  Care for her daughter who has had a lung transplant. Retired Building surveyor.   Review of Systems: Pertinent items are noted in HPI. Objective:  Physical Exam: Filed Vitals:   03/15/14 1025  BP: 140/67  Pulse: 67  Temp: 98.1 F (36.7 C)  TempSrc: Oral  Height: 5\' 4"  (1.626 m)  Weight: 139 lb 8 oz (63.277 kg)  SpO2: 98%   Constitutional: Vital signs reviewed.  Patient is a well-developed and well-nourished and is in no acute distress and cooperative with exam. Alert and oriented x3.  Cardiovascular: RRR, S1 normal, S2 normal, no MRG, pulses symmetric and intact bilaterally Pulmonary/Chest: normal respiratory effort, CTAB, no wheezes, rales, or rhonchi Musculoskeletal: No joint deformities or stiffness, ROM full.  Neurological: A&O x3, Strength is normal and symmetric bilaterally, motor strength bilaterally in all extremities 5/5. Skin: Warm, dry and intact. No rash, cyanosis, or clubbing.  Psychiatric: Normal mood and affect. speech and behavior is normal. Judgment and thought content normal. Cognition and memory are normal.    Assessment & Plan:

## 2014-03-15 NOTE — Progress Notes (Signed)
INTERNAL MEDICINE TEACHING ATTENDING ADDENDUM - Rowin Bayron, MD: I reviewed and discussed at the time of visit with the resident Dr. Boggala, the patient's medical history, physical examination, diagnosis and results of tests and treatment and I agree with the patient's care as documented. 

## 2014-03-15 NOTE — Assessment & Plan Note (Signed)
Per patient, PMR was diagnosed with PMR in 2006 and was empirically started on Prednisone therapy in 2014. Patient responded well to prednisone therapy despite failed attempts to taper prednisone to less than 6 mg. Apparently patient has had worsening of symptoms and ?a flare up when there is dose reduction in prednisone. Patient has an appointment scheduled for May 28th with Rheumatology. Discussed with the attending regarding further management.  Plans: Increase prednisone to 10 mg once daily. Follow up with Rheumatology on May 28th, 2015.

## 2014-03-15 NOTE — Patient Instructions (Signed)
Take Prednisone 5 mg, 2 tablets once daily with breakfast. Follow up with your Rheumatologist as scheduled.

## 2014-03-16 ENCOUNTER — Other Ambulatory Visit: Payer: Self-pay | Admitting: *Deleted

## 2014-03-16 ENCOUNTER — Other Ambulatory Visit: Payer: Self-pay | Admitting: Oncology

## 2014-03-16 MED ORDER — IMIPRAMINE HCL 50 MG PO TABS: ORAL_TABLET | ORAL | Status: DC

## 2014-03-16 NOTE — Telephone Encounter (Signed)
done

## 2014-03-22 ENCOUNTER — Other Ambulatory Visit: Payer: Self-pay | Admitting: Internal Medicine

## 2014-03-28 ENCOUNTER — Other Ambulatory Visit: Payer: Self-pay | Admitting: Internal Medicine

## 2014-03-31 ENCOUNTER — Ambulatory Visit (INDEPENDENT_AMBULATORY_CARE_PROVIDER_SITE_OTHER): Payer: Medicare Other | Admitting: Gastroenterology

## 2014-03-31 ENCOUNTER — Encounter: Payer: Self-pay | Admitting: Gastroenterology

## 2014-03-31 VITALS — BP 140/66 | HR 68 | Ht 64.0 in | Wt 140.4 lb

## 2014-03-31 DIAGNOSIS — K219 Gastro-esophageal reflux disease without esophagitis: Secondary | ICD-10-CM

## 2014-03-31 DIAGNOSIS — K5790 Diverticulosis of intestine, part unspecified, without perforation or abscess without bleeding: Secondary | ICD-10-CM

## 2014-03-31 DIAGNOSIS — K5730 Diverticulosis of large intestine without perforation or abscess without bleeding: Secondary | ICD-10-CM | POA: Diagnosis not present

## 2014-03-31 NOTE — Assessment & Plan Note (Signed)
Symptoms are well controlled with Nexium.  Plan to continue with the same. 

## 2014-03-31 NOTE — Patient Instructions (Signed)
Follow up as needed

## 2014-03-31 NOTE — Progress Notes (Signed)
_                                                                                                                History of Present Illness: Christine [name] 80 year old Afro-American female former patient of Dr. Verl Blalock here for followup of reflux.  On Nexium symptoms are well-controlled.  She has occasional break through pyrosis with dietary indiscretion.  She no longer has diarrhea.  She has no other GI complaints.    Past Medical History  Diagnosis Date  . GERD (gastroesophageal reflux disease)   . Hypertension   . Hypothyroidism   . Anemia   . Hyperlipidemia   . Sinus arrhythmia   . History of migraine headaches     a. Next atypical symptoms in the past. Patient was started on Neurontin for possible neuropathic origin of her pain.  Marland Kitchen COPD (chronic obstructive pulmonary disease)     a. PFTs 2011 show FEV1/FVC of 96%  . Heart murmur, systolic     2-D echo in December 2011 showed a normal EF with grade 1 diastolic dysfunction, trivial pulmonary regurgitation and mildly elevated PA pressure at 37 mmHg probably secondary to her COPD.dynamic obstruction-mid cavity obliteration;  b. 02/2012 Echo: EF 60-65%, mild LVH, PASP 14mmHg.  Marland Kitchen CAD (coronary artery disease)     a. LHC 08/23/11: dLM 40-50%, oRI 40%, oCFX 40%, oD1 70% (small and not amenable to PCI).  LM lesion did not appear to be flow limiting.  Medical rx was recommended;  b. Echo 08/23/11: mild LVH, EF 60-65%, grade 1 diast dysfxn, mild BAE, PASP 24;  c. 02/2012  Cath: LM 50d, LAD 50p, D1 70ost, RI 70p, RCA ok->Med Rx;  d. 01/2013 Cardiolite: EF 88, no ischemia/infarct.  . Carotid stenosis     a. dopplers 10/12:  0-39% bilat ICA;  b. 10/2012 U/S: 0-39% bilat, f/u 1 yr (10/2013).  . Chronic bronchitis   . Type II diabetes mellitus     a. On oral hypoglycemic agents.  . Blood transfusion 1972    "after daughter born, attempted to give me blood; couldn't give it cause my blood was cold" (09/23/2013)  . History of stomach  ulcers 1970's  . Polymyalgia rheumatica   . Diverticulosis of colon (without mention of hemorrhage)   . Benign hypertensive heart and kidney disease with diastolic CHF, NYHA class II and CKD stage III   . Migraines   . Anginal pain   . Shortness of breath on exertion     "just related to angina >1 yr ago" (09/23/2013)  . Arthritis     "back, arms, hips" (09/23/2013)   Past Surgical History  Procedure Laterality Date  . Cataract extraction w/ intraocular lens  implant, bilateral Bilateral 1990's  . Total abdominal hysterectomy  1976   family history includes COPD in her father; Colon cancer in her maternal grandmother; Other in her mother. Current Outpatient Prescriptions  Medication Sig Dispense Refill  . acetaminophen (TYLENOL) 500 MG tablet Take 1,000 mg by mouth every 6 (  six) hours as needed for headache. For pain      . ADVAIR DISKUS 250-50 MCG/DOSE AEPB inhale 1 dose by mouth every 12 hours  60 each  0  . albuterol (PROVENTIL HFA;VENTOLIN HFA) 108 (90 BASE) MCG/ACT inhaler Inhale 2 puffs into the lungs every 6 (six) hours as needed for wheezing or shortness of breath.      Marland Kitchen atorvastatin (LIPITOR) 40 MG tablet Take 1 tablet (40 mg total) by mouth every evening.  90 tablet  3  . clopidogrel (PLAVIX) 75 MG tablet take 1 tablet by mouth once daily  30 tablet  5  . esomeprazole (NEXIUM) 40 MG capsule take 1 capsule by mouth once daily BEFORE BREAKFAST  30 capsule  0  . glimepiride (AMARYL) 1 MG tablet take 1 tablet by mouth once daily before BREAKFAST  30 tablet  3  . hydroxypropyl methylcellulose (ISOPTO TEARS) 2.5 % ophthalmic solution Place 1 drop into both eyes 4 (four) times daily as needed for dry eyes.      Marland Kitchen imipramine (TOFRANIL) 50 MG tablet take 1 tablet by mouth twice a day  60 tablet  0  . isosorbide mononitrate (IMDUR) 60 MG 24 hr tablet Take 2 tablets (120 mg total) by mouth daily.  60 tablet  6  . levothyroxine (SYNTHROID, LEVOTHROID) 25 MCG tablet take 1 tablet by mouth  once daily  90 tablet  1  . loratadine (CLARITIN) 10 MG tablet take 1 tablet by mouth once daily if needed for allergies  30 tablet  5  . metoprolol succinate (TOPROL-XL) 100 MG 24 hr tablet take 1 tablet by mouth once daily with OR IMMEDIATELY FOLLOWING A MEAL  30 tablet  1  . Multiple Vitamin (MULITIVITAMIN WITH MINERALS) TABS Take 1 tablet by mouth daily.      . nitroGLYCERIN (NITROSTAT) 0.4 MG SL tablet Place 0.4 mg under the tongue every 5 (five) minutes as needed for chest pain.      . ONE TOUCH ULTRA TEST test strip use as directed  100 each  12  . ONETOUCH DELICA LANCETS 86V MISC use as directed  100 each  6  . predniSONE (DELTASONE) 5 MG tablet Take 2 tablets (10 mg total) by mouth daily with breakfast.  60 tablet  0  . promethazine (PHENERGAN) 12.5 MG tablet Take 12.5 mg by mouth every 6 (six) hours as needed for nausea or vomiting.      . sodium chloride (OCEAN) 0.65 % SOLN nasal spray Place 1 spray into both nostrils as needed for congestion.  1 Bottle  0  . traMADol (ULTRAM) 50 MG tablet Take 50 mg by mouth every 8 (eight) hours as needed (pain).      . valsartan (DIOVAN) 320 MG tablet Take 1 tablet (320 mg total) by mouth daily.  30 tablet  11   No current facility-administered medications for this visit.   Allergies as of 03/31/2014 - Review Complete 03/31/2014  Allergen Reaction Noted  . Aspirin Swelling and Other (See Comments)   . Codeine Nausea And Vomiting   . Penicillins Nausea And Vomiting 12/29/2011    reports that she has never smoked. She has never used smokeless tobacco. She reports that she does not drink alcohol or use illicit drugs.     Review of Systems: Pertinent positive and negative review of systems were noted in the above HPI section. All other review of systems were otherwise negative.  Vital signs were reviewed in today's medical record Physical Exam:  General: Well developed , well nourished, no acute distress   See Assessment and Plan under  Problem List

## 2014-03-31 NOTE — Assessment & Plan Note (Signed)
No further routine colorectal cancer screening in view of age

## 2014-04-13 ENCOUNTER — Ambulatory Visit: Payer: Medicare Other | Admitting: Cardiology

## 2014-04-14 DIAGNOSIS — I749 Embolism and thrombosis of unspecified artery: Secondary | ICD-10-CM | POA: Diagnosis not present

## 2014-04-14 DIAGNOSIS — M719 Bursopathy, unspecified: Secondary | ICD-10-CM | POA: Diagnosis not present

## 2014-04-14 DIAGNOSIS — M67919 Unspecified disorder of synovium and tendon, unspecified shoulder: Secondary | ICD-10-CM | POA: Diagnosis not present

## 2014-04-14 DIAGNOSIS — IMO0002 Reserved for concepts with insufficient information to code with codable children: Secondary | ICD-10-CM | POA: Diagnosis not present

## 2014-04-14 DIAGNOSIS — M545 Low back pain, unspecified: Secondary | ICD-10-CM | POA: Diagnosis not present

## 2014-04-15 ENCOUNTER — Other Ambulatory Visit: Payer: Self-pay | Admitting: Rheumatology

## 2014-04-15 DIAGNOSIS — M545 Low back pain, unspecified: Secondary | ICD-10-CM

## 2014-04-19 ENCOUNTER — Ambulatory Visit
Admission: RE | Admit: 2014-04-19 | Discharge: 2014-04-19 | Disposition: A | Discharge status: Home / Self Care | Payer: Medicare Other | Source: Ambulatory Visit | Attending: Rheumatology | Admitting: Rheumatology

## 2014-04-19 DIAGNOSIS — M545 Low back pain, unspecified: Secondary | ICD-10-CM

## 2014-04-19 DIAGNOSIS — M5137 Other intervertebral disc degeneration, lumbosacral region: Secondary | ICD-10-CM | POA: Diagnosis not present

## 2014-04-19 DIAGNOSIS — M48061 Spinal stenosis, lumbar region without neurogenic claudication: Secondary | ICD-10-CM | POA: Diagnosis not present

## 2014-04-19 DIAGNOSIS — M47817 Spondylosis without myelopathy or radiculopathy, lumbosacral region: Secondary | ICD-10-CM | POA: Diagnosis not present

## 2014-04-21 ENCOUNTER — Other Ambulatory Visit: Payer: Self-pay | Admitting: *Deleted

## 2014-04-21 MED ORDER — IMIPRAMINE HCL 50 MG PO TABS: ORAL_TABLET | ORAL | Status: DC

## 2014-04-22 ENCOUNTER — Other Ambulatory Visit: Payer: Self-pay | Admitting: *Deleted

## 2014-04-22 MED ORDER — ESOMEPRAZOLE MAGNESIUM 40 MG PO CPDR: DELAYED_RELEASE_CAPSULE | ORAL | Status: DC

## 2014-04-27 ENCOUNTER — Ambulatory Visit (INDEPENDENT_AMBULATORY_CARE_PROVIDER_SITE_OTHER): Payer: Medicare Other | Admitting: Internal Medicine

## 2014-04-27 ENCOUNTER — Other Ambulatory Visit: Payer: Self-pay | Admitting: Rheumatology

## 2014-04-27 ENCOUNTER — Encounter: Payer: Self-pay | Admitting: Internal Medicine

## 2014-04-27 VITALS — BP 139/65 | HR 66 | Temp 97.9°F | Resp 20 | Ht 65.0 in | Wt 141.7 lb

## 2014-04-27 DIAGNOSIS — I129 Hypertensive chronic kidney disease with stage 1 through stage 4 chronic kidney disease, or unspecified chronic kidney disease: Secondary | ICD-10-CM

## 2014-04-27 DIAGNOSIS — G43909 Migraine, unspecified, not intractable, without status migrainosus: Secondary | ICD-10-CM | POA: Diagnosis not present

## 2014-04-27 DIAGNOSIS — E785 Hyperlipidemia, unspecified: Secondary | ICD-10-CM

## 2014-04-27 DIAGNOSIS — M353 Polymyalgia rheumatica: Secondary | ICD-10-CM

## 2014-04-27 DIAGNOSIS — E039 Hypothyroidism, unspecified: Secondary | ICD-10-CM | POA: Diagnosis not present

## 2014-04-27 DIAGNOSIS — N183 Chronic kidney disease, stage 3 unspecified: Secondary | ICD-10-CM

## 2014-04-27 DIAGNOSIS — E119 Type 2 diabetes mellitus without complications: Secondary | ICD-10-CM

## 2014-04-27 DIAGNOSIS — J984 Other disorders of lung: Secondary | ICD-10-CM | POA: Diagnosis not present

## 2014-04-27 DIAGNOSIS — M549 Dorsalgia, unspecified: Secondary | ICD-10-CM

## 2014-04-27 DIAGNOSIS — M79609 Pain in unspecified limb: Secondary | ICD-10-CM | POA: Diagnosis not present

## 2014-04-27 DIAGNOSIS — M79651 Pain in right thigh: Secondary | ICD-10-CM

## 2014-04-27 DIAGNOSIS — M25519 Pain in unspecified shoulder: Secondary | ICD-10-CM | POA: Diagnosis not present

## 2014-04-27 DIAGNOSIS — N2889 Other specified disorders of kidney and ureter: Secondary | ICD-10-CM

## 2014-04-27 DIAGNOSIS — I2510 Atherosclerotic heart disease of native coronary artery without angina pectoris: Secondary | ICD-10-CM | POA: Diagnosis not present

## 2014-04-27 DIAGNOSIS — IMO0001 Reserved for inherently not codable concepts without codable children: Secondary | ICD-10-CM

## 2014-04-27 DIAGNOSIS — I10 Essential (primary) hypertension: Secondary | ICD-10-CM

## 2014-04-27 DIAGNOSIS — E1165 Type 2 diabetes mellitus with hyperglycemia: Secondary | ICD-10-CM

## 2014-04-27 DIAGNOSIS — G5600 Carpal tunnel syndrome, unspecified upper limb: Secondary | ICD-10-CM | POA: Diagnosis not present

## 2014-04-27 DIAGNOSIS — M5410 Radiculopathy, site unspecified: Secondary | ICD-10-CM

## 2014-04-27 LAB — GLUCOSE, CAPILLARY: Glucose-Capillary: 113 mg/dL — ABNORMAL HIGH (ref 70–99)

## 2014-04-27 LAB — POCT GLYCOSYLATED HEMOGLOBIN (HGB A1C): HEMOGLOBIN A1C: 7.4

## 2014-04-27 MED ORDER — PREDNISONE 5 MG PO TABS: 5.0000 mg | ORAL_TABLET | Freq: Every day | ORAL | Status: DC

## 2014-04-27 MED ORDER — ALBUTEROL SULFATE HFA 108 (90 BASE) MCG/ACT IN AERS: 2.0000 | INHALATION_SPRAY | Freq: Four times a day (QID) | RESPIRATORY_TRACT | Status: DC | PRN

## 2014-04-27 MED ORDER — PREDNISONE 1 MG PO TABS: 4.0000 mg | ORAL_TABLET | Freq: Every day | ORAL | Status: DC

## 2014-04-27 MED ORDER — NITROGLYCERIN 0.4 MG SL SUBL: 0.4000 mg | SUBLINGUAL_TABLET | SUBLINGUAL | Status: DC | PRN

## 2014-04-27 MED ORDER — PROMETHAZINE HCL 12.5 MG PO TABS: 12.5000 mg | ORAL_TABLET | Freq: Four times a day (QID) | ORAL | Status: DC | PRN

## 2014-04-27 MED ORDER — TRAMADOL HCL 50 MG PO TABS: 50.0000 mg | ORAL_TABLET | Freq: Three times a day (TID) | ORAL | Status: DC | PRN

## 2014-04-27 NOTE — Progress Notes (Signed)
Subjective:    [name], female    DOB: 01/24/1934, 80 y.o.   MRN: [id number]  HPI Christine [name] is a 80yo woman with PMH of CRI stage 3, Hypothyroidism, DM2, HLD, HTN, PMR, CAD who presents for follow up.   Christine [name] reports that she is having a lot of pain.  Back, thighs, legs and feet.  Dull pain in the back.  Pain in her feet feels like a bee sting.  Left lateral knee pain feels like bone pain.  This is similar to pain she has had before but has moved up to her back.  She went to see Christine [name] in response to this back pain.  She is due back to see him on 05/19/14.  She reports that he has suggested a back injection with steroid to help with her symptoms.  It appears per note that there is concern for neurogenic claudication vs. HNP.  She has been taking tylenol about 6-8 xtra strength per day.  This has helped a little and has helped her get to sleep.  For previous pain she has taken ultram and percocet.  Ultram has made her nauseous in the past.   No weakness, no falls.   MRI was performed by Christine [name], results in epic.   She has a history of CAD.  Her first symptoms were SOB and fatigue and she couldn't get anything done.  She finally had arm pain and she was given nitroglycerin which helped.  She was admitted and had a LHC.  She did not require stents, however, she was started on medications and she has been doing well.  She doesn't have chest pain anymore and she is able to do most activity without SOB.    Migraines, none recently.  Taking imipramine.    She has osteopenia and takes Vitamin D and Calcium.  She had a hysterectomy due to her "womb dropping."  She had always had normal PAP smears prior to that.    For her DM, she is checking her BS every morning.  She reports that she has 80-90 BS in the AM. She is taking glimepiride without issue.  She has had no symptoms of hypoglycemia.  She did not bring in her meter.   She has no history of eye problems and no  blurry vision.  She has the above symptoms in her feet, aching pain, but no burning pain.  She has chronic renal insufficiency which has been stable.  She is on diovan.    We reviewed her medications:   Current Outpatient Prescriptions on File Prior to Visit  Medication Sig Dispense Refill  . acetaminophen (TYLENOL) 500 MG tablet Take 1,000 mg by mouth every 6 (six) hours as needed for headache. For pain      . ADVAIR DISKUS 250-50 MCG/DOSE AEPB inhale 1 dose by mouth every 12 hours  60 each  0  . atorvastatin (LIPITOR) 40 MG tablet Take 1 tablet (40 mg total) by mouth every evening.  90 tablet  3  . clopidogrel (PLAVIX) 75 MG tablet take 1 tablet by mouth once daily  30 tablet  5  . esomeprazole (NEXIUM) 40 MG capsule take 1 capsule by mouth once daily BEFORE BREAKFAST  30 capsule  11  . glimepiride (AMARYL) 1 MG tablet take 1 tablet by mouth once daily before BREAKFAST  30 tablet  3  . imipramine (TOFRANIL) 50 MG tablet take 1 tablet by mouth twice a day  60 tablet  6  . isosorbide mononitrate (IMDUR) 60 MG 24 hr tablet Take 2 tablets (120 mg total) by mouth daily.  60 tablet  6  . levothyroxine (SYNTHROID, LEVOTHROID) 25 MCG tablet take 1 tablet by mouth once daily  90 tablet  1  . loratadine (CLARITIN) 10 MG tablet take 1 tablet by mouth once daily if needed for allergies  30 tablet  5  . metoprolol succinate (TOPROL-XL) 100 MG 24 hr tablet take 1 tablet by mouth once daily with OR IMMEDIATELY FOLLOWING A MEAL  30 tablet  1  . Multiple Vitamin (MULITIVITAMIN WITH MINERALS) TABS Take 1 tablet by mouth daily.      . ONE TOUCH ULTRA TEST test strip use as directed  100 each  12  . ONETOUCH DELICA LANCETS 64Q MISC use as directed  100 each  6  . predniSONE (DELTASONE) 5 MG tablet Take 2 tablets (10 mg total) by mouth daily with breakfast.  60 tablet  0  . valsartan (DIOVAN) 320 MG tablet Take 1 tablet (320 mg total) by mouth daily.  30 tablet  11  . hydroxypropyl methylcellulose (ISOPTO TEARS)  2.5 % ophthalmic solution Place 1 drop into both eyes 4 (four) times daily as needed for dry eyes.      . sodium chloride (OCEAN) 0.65 % SOLN nasal spray Place 1 spray into both nostrils as needed for congestion.  1 Bottle  0   No current facility-administered medications on file prior to visit.     Review of Systems  Constitutional: Negative for fever and fatigue.  HENT: Negative for ear discharge and ear pain.   Eyes: Negative for pain and redness.  Respiratory: Negative for cough, shortness of breath and wheezing.   Cardiovascular: Negative for chest pain and leg swelling.  Gastrointestinal: Negative for nausea, vomiting, abdominal pain and diarrhea.  Genitourinary: Negative for dysuria and difficulty urinating.  Musculoskeletal: Positive for back pain and myalgias. Negative for gait problem, neck pain and neck stiffness.  Skin: Negative for rash and wound.  Neurological: Negative for dizziness, weakness and numbness.       Objective:   Physical Exam  Constitutional: She is oriented to person, place, and time. She appears well-developed and well-nourished. No distress.  HENT:  Head: Normocephalic and atraumatic.  Eyes: No scleral icterus.  Neck: Neck supple.  Cardiovascular: Normal rate, regular rhythm and normal heart sounds.   Pulses were easily palpable in RLE, DP diminished in LLE with mildly diminished PT on that side.  Her extremities were warm and without skin changes. She had normal strength in her BLE, however, left side felt a little weaker in plantarflexion of the foot and hip flexion.    Lymphadenopathy:    She has no cervical adenopathy.  Neurological: She is alert and oriented to person, place, and time. No cranial nerve deficit. She exhibits normal muscle tone.  Skin: Skin is warm and dry. No erythema.  Psychiatric: She has a normal mood and affect. Her behavior is normal.    A1C 7.4      Assessment & Plan:  RTC in 6 weeks after back injection and follow up  with Christine [name].

## 2014-04-27 NOTE — Assessment & Plan Note (Signed)
Last evaluated in April, stable with Cr at 1.04.  She is on an ARB for any albuminuria.  Will continue to monitor.  No change in urinary symptoms.

## 2014-04-27 NOTE — Assessment & Plan Note (Signed)
Stable, continue imipramine.

## 2014-04-27 NOTE — Patient Instructions (Signed)
General Instructions: Please schedule a follow up visit within the next 6 weeks.   For your medications:   Please bring all of your pill  Bottles with you to each visit.  This will help make sure that we have an up to date list of all the medications you are taking.  Please also bring any over the counter herbal medications you are taking (not including advil, tylenol, etc.)  Please continue taking your medications as prescribed  For your Prednisone, we will decrease your dose to 9mg  today for 1 month with the goal of getting you off the prednisone. For your pain, You will have Ultram 50mg  which you can take as needed.  I would recommend taking this once, and taking 2 extra strength tylenol, then take the ultram again.  This will decrease your use of tylenol and avoid the nausea you have with ultram.  You will also have a new prescription for phenergan to help with your nausea.   You are due to have a back and shoulder injection.  Please go to those appointments.  Right now, we are treating you for nerve pain related to your back.  If the injection does not work, you may have claudication (see below) which is from the arteries in your legs being narrow.  If the injection does not help your back pain, we will likely look further into the arteries in your legs.   Your diabetes is slightly worse today, this is due to the prednisone you are taking.  We will try to wean your prednisone off.     For your Diabetes:   Please continue to check your blood sugar at least 2 times a day.   Please keep a record of your blood sugars, either in your glucometer or on paper.  Please bring your glucometer to every clinic visit.   Please check your feet at least once weekly to see if you are developing calluses or wounds.   Continue to follow a good diet, including limiting refined sugars and carbohydrates and exercising at least 3 times per week.     Thank you!    Treatment Goals:  Goals (1 Years of  Data) as of 04/27/14   None      Progress Toward Treatment Goals:  Treatment Goal 04/27/2014  Hemoglobin A1C deteriorated  Blood pressure -    Self Care Goals & Plans:  Self Care Goal 02/17/2014  Manage my medications take my medicines as prescribed; bring my medications to every visit  Monitor my health keep track of my blood glucose; bring my glucose meter and log to each visit; check my feet daily  Eat healthy foods eat baked foods instead of fried foods; eat foods that are low in salt; eat more vegetables  Be physically active -    Home Blood Glucose Monitoring 04/27/2014  Check my blood sugar 2 times a day  When to check my blood sugar before breakfast; before lunch     Care Management & Community Referrals:  No flowsheet data found.   Back Pain, Adult Back pain is very common. The pain often gets better over time. The cause of back pain is usually not dangerous. Most people can learn to manage their back pain on their own.  HOME CARE   Stay active. Start with short walks on flat ground if you can. Try to walk farther each day.  Do not sit, drive, or stand in one place for more than 30 minutes. Do not  stay in bed.  Do not avoid exercise or work. Activity can help your back heal faster.  Be careful when you bend or lift an object. Bend at your knees, keep the object close to you, and do not twist.  Sleep on a firm mattress. Lie on your side, and bend your knees. If you lie on your back, put a pillow under your knees.  Only take medicines as told by your doctor.  Put ice on the injured area.  Put ice in a plastic bag.  Place a towel between your skin and the bag.  Leave the ice on for 15-20 minutes, 03-04 times a day for the first 2 to 3 days. After that, you can switch between ice and heat packs.  Ask your doctor about back exercises or massage.  Avoid feeling anxious or stressed. Find good ways to deal with stress, such as exercise. GET HELP RIGHT AWAY IF:     Your pain does not go away with rest or medicine.  Your pain does not go away in 1 week.  You have new problems.  You do not feel well.  The pain spreads into your legs.  You cannot control when you poop (bowel movement) or pee (urinate).  Your arms or legs feel weak or lose feeling (numbness).  You feel sick to your stomach (nauseous) or throw up (vomit).  You have belly (abdominal) pain.  You feel like you may pass out (faint). MAKE SURE YOU:   Understand these instructions.  Will watch your condition.  Will get help right away if you are not doing well or get worse. Document Released: 04/22/2008 Document Revised: 01/27/2012 Document Reviewed: 03/25/2011 [facility] Patient Information 2014 [place].    Intermittent Claudication Blockage of leg arteries results from poor circulation of blood in the leg arteries. This produces an aching, tired, and sometimes burning pain in the legs that is brought on by exercise and made better by rest. Claudication refers to the limping that happens from leg cramps. It is also referred to as Vaso-occlusive disease of the legs, arterial insufficiency of the legs, recurrent leg pain, recurrent leg cramping and calf pain with exercise.  CAUSES  This condition is due to narrowing or blockage of the arteries (muscular vessels which carry blood away from the heart and around the body). Blockage of arteries can occur anywhere in the body. If they occur in the heart, a person may experience angina (chest pain) or even a heart attack. If they occur in the neck or the brain, a person may have a stroke. Intermittent claudication is when the blockage occurs in the legs, most commonly in the calf or the foot.  Atherosclerosis, or blockage of arteries, can occur for many reasons. Some of these are smoking, diabetes, and high cholesterol. SYMPTOMS  Intermittent claudication may occur in both legs, and it often continues to get worse over time.  However, some people complain only of weakness in the legs when walking, or a feeling of "tiredness" in the buttocks. Impotence (not able to have an erection) is an occasional complaint in men. Pain while resting is uncommon.  WHAT TO EXPECT AT [facility] PROVIDER'S OFFICE: Your medical history will be asked for and a physical examination will be performed. Medical history questions documenting claudication in detail may include:   Time pattern  Do you have leg cramps at night (nocturnal cramps)?  How often does leg pain with cramping occur?  Is it getting worse?  What  is the quality of the pain?  Is the pain sharp?  Is there an aching pain with the cramps?  Aggravating factors  Is it worse after you exercise?  Is it worse after you are standing for a while?  Do you smoke? How much?  Do you drink alcohol? How much?  Are you diabetic? How well is your blood sugar controlled?  Other  What other symptoms are also present?  Has there been impotence (men)?  Is there pain in the back?  Is there a darkening of the skin of the legs, feet or toes?  Is there weakness or paralysis of the legs? The physical examination may include evaluation of the femoral pulse (in the groin) and the other areas where the pulse can be felt in the legs. DIAGNOSIS  Diagnostic tests that may be performed include:  Blood pressure measured in arms and legs for comparison.  Doppler ultrasonography on the legs and the heart.  Duplex Doppler/ultrasound exam of extremity to visualize arterial blood flow.  ECG- to evaluate the activity of your heart.  Aortography- to visualize blockages in your arteries. TREATMENT Surgical treatment may be suggested if claudication interferes with the patient's activities or work, and if the diseased arteries do not seem to be improving after treatment. Be aware that this condition can worsen over time and you should carefully monitor your condition. HOME  CARE INSTRUCTIONS  Talk to your caregiver about the cause of your leg cramping and about what to do at home to relieve it.  A healthy diet is important to lessen the likeliness of atherosclerosis.  A program of daily walking for short periods, and stopping for pain or cramping, may help improve function.  It is important to stop smoking.  Avoid putting hot or cold items on legs.  Avoid tight shoes. SEEK MEDICAL CARE IF: There are many other causes of leg pain such as arthritis or low blood potassium. However, some causes of leg pain may be life threatening such as a blood clot in the legs. Seek medical attention if you have:  Leg pain that does not go away.  Legs that may be red, hot or swollen.  Ulcers or sores appear on your ankle or foot.  Any chest pain or shortness of breath accompanying leg pain.  Diabetes.  You are pregnant. SEEK IMMEDIATE MEDICAL CARE IF:   Your leg pain becomes severe or will not go away.  Your foot turns blue or a dark color.  Your leg becomes red, hot or swollen or you develop a fever over 102F.  Any chest pain or shortness of breath accompanying leg pain. MAKE SURE YOU:   Understand these instructions.  Will watch your condition.  Will get help right away if you are not doing well or get worse. Document Released: 09/06/2004 Document Revised: 01/27/2012 Document Reviewed: 06/24/2008 [facility] Patient Information 2014 [place].

## 2014-04-27 NOTE — Assessment & Plan Note (Signed)
Lab Results  Component Value Date   HGBA1C 7.4 04/27/2014   HGBA1C 7.3 12/22/2013   HGBA1C 6.4 06/11/2013     Assessment: Diabetes control: fair control Progress toward A1C goal:  deteriorated Comments: She has been taking steroids chronically for the last few months.  She did not bring in her meter.  No report of hypoglycemia.   Plan: Medications:  continue current medications Home glucose monitoring: Frequency: 2 times a day Timing: before breakfast;before lunch Instruction/counseling given: reminded to bring blood glucose meter & log to each visit and reminded to bring medications to each visit Educational resources provided: brochure Self management tools provided:   Other plans:   Eye exam was done 09/2013 and showed no DR.  BP is 300P systolic, LDL 75 and at goal, foot exam 12/2013 okay.  She has stable renal function, on an ARB.   We discussed the need to decrease the steroid dose given the fact that her pain does not seem related to PMR.  Will start to bring the dose down to 9mg  daily today.  We discussed dietary changes.

## 2014-04-27 NOTE — Assessment & Plan Note (Signed)
BP Readings from Last 3 Encounters:  04/27/14 139/65  03/31/14 140/66  03/15/14 140/67    Lab Results  Component Value Date   NA 137 03/15/2014   K 3.7 03/15/2014   CREATININE 1.04 03/15/2014    Assessment: Blood pressure control: controlled Progress toward BP goal:  unchanged   Plan: Medications:  continue current medications Educational resources provided:   Self management tools provided:   Other plans: Renal function stable, continue to monitor closely.

## 2014-04-27 NOTE — Assessment & Plan Note (Signed)
TSH at goal at last check, continue synthroid.

## 2014-04-27 NOTE — Progress Notes (Signed)
Tramadol Rx called into [facility] on [address] As prescribed. Yvonna Alanis, RN, 04/27/14, 11: 05 AM

## 2014-04-27 NOTE — Assessment & Plan Note (Signed)
LDL 75 at last check 6 months ago.  Continue lipitor.

## 2014-04-28 DIAGNOSIS — M25819 Other specified joint disorders, unspecified shoulder: Secondary | ICD-10-CM | POA: Diagnosis not present

## 2014-04-28 DIAGNOSIS — M12519 Traumatic arthropathy, unspecified shoulder: Secondary | ICD-10-CM | POA: Diagnosis not present

## 2014-04-28 NOTE — Assessment & Plan Note (Addendum)
She is currently being evaluated for claudication (neurogenic) and HNP due to her back pain. She is following with Dr. Charlestine Night in [place].  If her symptoms are persistent, will consider checking ABI's, however, her story is not classic for arterial claudication.    For Pain, will give small Rx of tramadol with phenergan for nausea.  She has been on this medication before without issue.  She does not have a history of seizure disorder.   Will also plan to start weaning her prednisone back down, to 9mg  daily today.

## 2014-04-28 NOTE — Assessment & Plan Note (Signed)
She currently has no chest pain, is taking all medications as prescribed.  Pulses are good except somewhat diminished pulses in the left foot.

## 2014-04-28 NOTE — Assessment & Plan Note (Signed)
She has started seeing Dr. Charlestine Night in Rheumatology.  Per his note, he is not sure she has PMR and her leg pain is being worked up as claudication.  Per the patient, she is due to have a steroid injection in the back for her symptoms.

## 2014-04-28 NOTE — Assessment & Plan Note (Signed)
She is on Advair and PRN albuterol.  Refills today.  She reports not needing her albuterol since starting advair.

## 2014-04-29 ENCOUNTER — Telehealth: Payer: Self-pay | Admitting: Cardiology

## 2014-04-29 NOTE — Telephone Encounter (Signed)
Spoke with Danielle

## 2014-04-29 NOTE — Telephone Encounter (Signed)
LMTCB for Danielle.

## 2014-04-29 NOTE — Telephone Encounter (Signed)
New message     Pt needs an injection---lumbar steroid injection.  Need clearance to stop plavix 5 days prior .  She has not been scheduled.  Fax clearance to [phone] or call to give a verbal

## 2014-04-29 NOTE — Telephone Encounter (Signed)
That would be ok.

## 2014-05-02 ENCOUNTER — Other Ambulatory Visit: Payer: Self-pay | Admitting: *Deleted

## 2014-05-02 MED ORDER — FLUTICASONE-SALMETEROL 250-50 MCG/DOSE IN AEPB: INHALATION_SPRAY | RESPIRATORY_TRACT | Status: DC

## 2014-05-04 ENCOUNTER — Ambulatory Visit
Admission: RE | Admit: 2014-05-04 | Discharge: 2014-05-04 | Disposition: A | Discharge status: Home / Self Care | Payer: Medicare Other | Source: Ambulatory Visit | Attending: Rheumatology | Admitting: Rheumatology

## 2014-05-04 VITALS — BP 159/68 | HR 58

## 2014-05-04 DIAGNOSIS — M549 Dorsalgia, unspecified: Secondary | ICD-10-CM

## 2014-05-04 DIAGNOSIS — M5410 Radiculopathy, site unspecified: Secondary | ICD-10-CM

## 2014-05-04 DIAGNOSIS — M545 Low back pain, unspecified: Secondary | ICD-10-CM | POA: Diagnosis not present

## 2014-05-04 MED ORDER — METHYLPREDNISOLONE ACETATE 40 MG/ML INJ SUSP (RADIOLOG
120.0000 mg | Freq: Once | INTRAMUSCULAR | Status: AC
  Administered 2014-05-04: 120 mg via EPIDURAL

## 2014-05-04 MED ORDER — IOHEXOL 180 MG/ML  SOLN
1.0000 mL | Freq: Once | INTRAMUSCULAR | Status: AC | PRN
  Administered 2014-05-04: 1 mL via EPIDURAL

## 2014-05-04 NOTE — Discharge Instructions (Addendum)

## 2014-05-06 ENCOUNTER — Telehealth: Payer: Self-pay | Admitting: Internal Medicine

## 2014-05-06 ENCOUNTER — Other Ambulatory Visit: Payer: Self-pay | Admitting: Internal Medicine

## 2014-05-06 MED ORDER — HYDROXYZINE HCL 25 MG PO TABS
25.0000 mg | ORAL_TABLET | Freq: Two times a day (BID) | ORAL | Status: DC | PRN
Start: 2014-05-06 — End: 2014-08-05

## 2014-05-06 NOTE — Telephone Encounter (Signed)
Called and spoke with [name].  [name] called the office as the clinic is closed today and she was not able to reach anyone.  Reports she is out of her phenergan and her insurance company is requesting prior authorization prior to filling any more of the medication.  As the clinic is closed, I cannot complete and send that paperwork today.  I instead prescribed her low dose hydroxyzine for her nausea prior to tramadol dosing and advised her that the medication may cause drowsiness and anticholinerginic effects.  We will address the pre-auth on Monday when the clinic is open.  [name] expressed understanding.   Gilles Chiquito, MD

## 2014-05-09 ENCOUNTER — Telehealth: Payer: Self-pay | Admitting: *Deleted

## 2014-05-09 NOTE — Telephone Encounter (Signed)
Call to [place] for Prior Authorization for Promethazine 12.5 mg .  Prior Authorization approved [id number].  Starting date 02/08/2014 thru 05/09/2015.  Call to [place]  To inform them of the approval.  Sander Nephew, RN 05/09/2014 9:11 AM.

## 2014-05-11 ENCOUNTER — Ambulatory Visit (INDEPENDENT_AMBULATORY_CARE_PROVIDER_SITE_OTHER): Payer: Medicare Other | Admitting: Internal Medicine

## 2014-05-11 ENCOUNTER — Encounter: Payer: Self-pay | Admitting: Internal Medicine

## 2014-05-11 VITALS — BP 148/69 | HR 64 | Temp 97.5°F | Ht 65.0 in | Wt 137.8 lb

## 2014-05-11 DIAGNOSIS — B9689 Other specified bacterial agents as the cause of diseases classified elsewhere: Secondary | ICD-10-CM | POA: Diagnosis not present

## 2014-05-11 DIAGNOSIS — I10 Essential (primary) hypertension: Secondary | ICD-10-CM | POA: Diagnosis not present

## 2014-05-11 DIAGNOSIS — A499 Bacterial infection, unspecified: Secondary | ICD-10-CM

## 2014-05-11 DIAGNOSIS — J329 Chronic sinusitis, unspecified: Secondary | ICD-10-CM

## 2014-05-11 DIAGNOSIS — E1165 Type 2 diabetes mellitus with hyperglycemia: Secondary | ICD-10-CM

## 2014-05-11 DIAGNOSIS — IMO0001 Reserved for inherently not codable concepts without codable children: Secondary | ICD-10-CM | POA: Diagnosis not present

## 2014-05-11 DIAGNOSIS — J208 Acute bronchitis due to other specified organisms: Principal | ICD-10-CM

## 2014-05-11 DIAGNOSIS — J209 Acute bronchitis, unspecified: Secondary | ICD-10-CM

## 2014-05-11 LAB — GLUCOSE, CAPILLARY: Glucose-Capillary: 147 mg/dL — ABNORMAL HIGH (ref 70–99)

## 2014-05-11 MED ORDER — AZITHROMYCIN 250 MG PO TABS: ORAL_TABLET | ORAL | Status: DC

## 2014-05-11 NOTE — Patient Instructions (Addendum)
General Instructions: Dear Ms. Claiborne Billings,  I hope you feel better soon.   Please start azithromycin antibiotics--500mg  on day 1, then 250mg  for days 2-5  Continue with your nasal congestion medications and claritin as well.  If your symptoms get worse or do not improve or you develop fever and shortness of breath call us right away or if severe go to ER  Follow up with Dr. Daryll Drown on 06/15/14 as scheduled  Please bring your medicines with you each time you come to clinic.  Medicines may include prescription medications, over-the-counter medications, herbal remedies, eye drops, vitamins, or other pills.  Progress Toward Treatment Goals:  Treatment Goal 05/11/2014  Hemoglobin A1C -  Blood pressure improved    Self Care Goals & Plans:  Self Care Goal 05/11/2014  Manage my medications take my medicines as prescribed; bring my medications to every visit; refill my medications on time  Monitor my health keep track of my blood glucose; bring my glucose meter and log to each visit  Eat healthy foods eat more vegetables; eat foods that are low in salt; eat baked foods instead of fried foods; eat fruit for snacks and desserts  Be physically active find an activity I enjoy    Home Blood Glucose Monitoring 04/27/2014  Check my blood sugar 2 times a day  When to check my blood sugar before breakfast; before lunch    Care Management & Community Referrals:  No flowsheet data found.    Azithromycin tablets What is this medicine? AZITHROMYCIN (az ith roe MYE sin) is a macrolide antibiotic. It is used to treat or prevent certain kinds of bacterial infections. It will not work for colds, flu, or other viral infections. This medicine may be used for other purposes; ask your health care Sharae Zappulla or pharmacist if you have questions. COMMON BRAND NAME(S): Zithromax, Zithromax Tri-Pak, Zithromax Z-Pak What should I tell my health care Zenith Lamphier before I take this medicine? They need to know if you have  any of these conditions: -kidney disease -liver disease -irregular heartbeat or heart disease -an unusual or allergic reaction to azithromycin, erythromycin, other macrolide antibiotics, foods, dyes, or preservatives -pregnant or trying to get pregnant -breast-feeding How should I use this medicine? Take this medicine by mouth with a full glass of water. Follow the directions on the prescription label. The tablets can be taken with food or on an empty stomach. If the medicine upsets your stomach, take it with food. Take your medicine at regular intervals. Do not take your medicine more often than directed. Take all of your medicine as directed even if you think your are better. Do not skip doses or stop your medicine early. Talk to your pediatrician regarding the use of this medicine in children. Special care may be needed. Overdosage: If you think you have taken too much of this medicine contact a poison control center or emergency room at once. NOTE: This medicine is only for you. Do not share this medicine with others. What if I miss a dose? If you miss a dose, take it as soon as you can. If it is almost time for your next dose, take only that dose. Do not take double or extra doses. What may interact with this medicine? Do not take this medicine with any of the following medications: -lincomycin This medicine may also interact with the following medications: -amiodarone -antacids -birth control pills -cyclosporine -digoxin -magnesium -nelfinavir -phenytoin -warfarin This list may not describe all possible interactions. Give your health care  Gordie Crumby a list of all the medicines, herbs, non-prescription drugs, or dietary supplements you use. Also tell them if you smoke, drink alcohol, or use illegal drugs. Some items may interact with your medicine. What should I watch for while using this medicine? Tell your doctor or health care professional if your symptoms do not improve. Do not  treat diarrhea with over the counter products. Contact your doctor if you have diarrhea that lasts more than 2 days or if it is severe and watery. This medicine can make you more sensitive to the sun. Keep out of the sun. If you cannot avoid being in the sun, wear protective clothing and use sunscreen. Do not use sun lamps or tanning beds/booths. What side effects may I notice from receiving this medicine? Side effects that you should report to your doctor or health care professional as soon as possible: -allergic reactions like skin rash, itching or hives, swelling of the face, lips, or tongue -confusion, nightmares or hallucinations -dark urine -difficulty breathing -hearing loss -irregular heartbeat or chest pain -pain or difficulty passing urine -redness, blistering, peeling or loosening of the skin, including inside the mouth -white patches or sores in the mouth -yellowing of the eyes or skin Side effects that usually do not require medical attention (report to your doctor or health care professional if they continue or are bothersome): -diarrhea -dizziness, drowsiness -headache -stomach upset or vomiting -tooth discoloration -vaginal irritation This list may not describe all possible side effects. Call your doctor for medical advice about side effects. You may report side effects to FDA at 1-800-FDA-1088. Where should I keep my medicine? Keep out of the reach of children. Store at room temperature between 15 and 30 degrees C (59 and 86 degrees F). Throw away any unused medicine after the expiration date. NOTE: This sheet is a summary. It may not cover all possible information. If you have questions about this medicine, talk to your doctor, pharmacist, or health care Jarry Manon.  2015, Elsevier/Gold Standard. (2013-06-10 15:38:48)  Acute Bronchitis Bronchitis is when the airways that extend from the windpipe into the lungs get red, puffy, and painful (inflamed). Bronchitis often  causes thick spit (mucus) to develop. This leads to a cough. A cough is the most common symptom of bronchitis. In acute bronchitis, the condition usually begins suddenly and goes away over time (usually in 2 weeks). Smoking, allergies, and asthma can make bronchitis worse. Repeated episodes of bronchitis may cause more lung problems. HOME CARE  Rest.  Drink enough fluids to keep your pee (urine) clear or pale yellow (unless you need to limit fluids as told by your doctor).  Only take over-the-counter or prescription medicines as told by your doctor.  Avoid smoking and secondhand smoke. These can make bronchitis worse. If you are a smoker, think about using nicotine gum or skin patches. Quitting smoking will help your lungs heal faster.  Reduce the chance of getting bronchitis again by:  Washing your hands often.  Avoiding people with cold symptoms.  Trying not to touch your hands to your mouth, nose, or eyes.  Follow up with your doctor as told. GET HELP IF: Your symptoms do not improve after 1 week of treatment. Symptoms include:  Cough.  Fever.  Coughing up thick spit.  Body aches.  Chest congestion.  Chills.  Shortness of breath.  Sore throat. GET HELP RIGHT AWAY IF:   You have an increased fever.  You have chills.  You have severe shortness of breath.  You  have bloody thick spit (sputum).  You throw up (vomit) often.  You lose too much body fluid (dehydration).  You have a severe headache.  You faint. MAKE SURE YOU:   Understand these instructions.  Will watch your condition.  Will get help right away if you are not doing well or get worse. Document Released: 04/22/2008 Document Revised: 07/07/2013 Document Reviewed: 04/27/2013 [facility] Patient Information 2015 [place], [place]. This information is not intended to replace advice given to you by your health care Azahel Belcastro. Make sure you discuss any questions you have with your health care Quincie Haroon.

## 2014-05-11 NOTE — Progress Notes (Signed)
Subjective:   [name] female   DOB: 01/24/1934 80 y.o.   MRN: [id number]  HPI: [name] is a 80 y.o. female with HTN, COPD and other PMH as listed below presenting to [facility] today for acute visit with complaints of cough and congestion since Friday.  She has had multiple sinus infections in the past she claims treated with antibiotics but she claims she has been doing well lately. She normally takes claritin daily and when she feels like another episode is coming on she can keep it at [place] with a few days of otc congestion medication (sinus pressure and congestion relief) and nasal decongestants, however this time no improvement and also no improvement of cough with Tussin DM. She does not tolerate codeine.  She says she just feels unwell, tired and sick. Denies fever and chill, nausea or vomiting and has chest soreness from coughing. The cough is now productive with thick yellow sputum and mild sore throat. +sick contact of great grand-daughter a few days ago.   Past Medical History  Diagnosis Date  . GERD (gastroesophageal reflux disease)   . Hypertension   . Hypothyroidism   . Anemia   . Hyperlipidemia   . Sinus arrhythmia   . History of migraine headaches     a. Next atypical symptoms in the past. Patient was started on Neurontin for possible neuropathic origin of her pain.  Marland Kitchen COPD (chronic obstructive pulmonary disease)     a. PFTs 2011 show FEV1/FVC of 96%  . Heart murmur, systolic     2-D echo in December 2011 showed a normal EF with grade 1 diastolic dysfunction, trivial pulmonary regurgitation and mildly elevated PA pressure at 37 mmHg probably secondary to her COPD.dynamic obstruction-mid cavity obliteration;  b. 02/2012 Echo: EF 60-65%, mild LVH, PASP 7mmHg.  Marland Kitchen CAD (coronary artery disease)     a. LHC 08/23/11: dLM 40-50%, oRI 40%, oCFX 40%, oD1 70% (small and not amenable to PCI).  LM lesion did not appear to be flow limiting.  Medical rx was recommended;  b.  Echo 08/23/11: mild LVH, EF 60-65%, grade 1 diast dysfxn, mild BAE, PASP 24;  c. 02/2012  Cath: LM 50d, LAD 50p, D1 70ost, RI 70p, RCA ok->Med Rx;  d. 01/2013 Cardiolite: EF 88, no ischemia/infarct.  . Carotid stenosis     a. dopplers 10/12:  0-39% bilat ICA;  b. 10/2012 U/S: 0-39% bilat, f/u 1 yr (10/2013).  . Chronic bronchitis   . Type II diabetes mellitus     a. On oral hypoglycemic agents.  . Blood transfusion 1972    "after daughter born, attempted to give me blood; couldn't give it cause my blood was cold" (09/23/2013)  . History of stomach ulcers 1970's  . Polymyalgia rheumatica   . Diverticulosis of colon (without mention of hemorrhage)   . Benign hypertensive heart and kidney disease with diastolic CHF, NYHA class II and CKD stage III   . Migraines   . Anginal pain   . Shortness of breath on exertion     "just related to angina >1 yr ago" (09/23/2013)  . Arthritis     "back, arms, hips" (09/23/2013)   Current Outpatient Prescriptions  Medication Sig Dispense Refill  . acetaminophen (TYLENOL) 500 MG tablet Take 1,000 mg by mouth every 6 (six) hours as needed for headache. For pain      . albuterol (PROVENTIL HFA;VENTOLIN HFA) 108 (90 BASE) MCG/ACT inhaler Inhale 2 puffs into the lungs  every 6 (six) hours as needed for wheezing or shortness of breath.  1 Inhaler  11  . atorvastatin (LIPITOR) 40 MG tablet Take 1 tablet (40 mg total) by mouth every evening.  90 tablet  3  . clopidogrel (PLAVIX) 75 MG tablet take 1 tablet by mouth once daily  30 tablet  5  . esomeprazole (NEXIUM) 40 MG capsule take 1 capsule by mouth once daily BEFORE BREAKFAST  30 capsule  11  . Fluticasone-Salmeterol (ADVAIR DISKUS) 250-50 MCG/DOSE AEPB inhale 1 dose by mouth every 12 hours  60 each  2  . glimepiride (AMARYL) 1 MG tablet take 1 tablet by mouth once daily before BREAKFAST  30 tablet  3  . hydroxypropyl methylcellulose (ISOPTO TEARS) 2.5 % ophthalmic solution Place 1 drop into both eyes 4 (four) times  daily as needed for dry eyes.      . hydrOXYzine (ATARAX/VISTARIL) 25 MG tablet Take 1 tablet (25 mg total) by mouth 2 (two) times daily as needed for nausea or vomiting. Can cause drowsiness, do not drive while taking  30 tablet  0  . imipramine (TOFRANIL) 50 MG tablet take 1 tablet by mouth twice a day  60 tablet  6  . isosorbide mononitrate (IMDUR) 60 MG 24 hr tablet Take 2 tablets (120 mg total) by mouth daily.  60 tablet  6  . levothyroxine (SYNTHROID, LEVOTHROID) 25 MCG tablet take 1 tablet by mouth once daily  90 tablet  1  . loratadine (CLARITIN) 10 MG tablet take 1 tablet by mouth once daily if needed for allergies  30 tablet  5  . metoprolol succinate (TOPROL-XL) 100 MG 24 hr tablet take 1 tablet by mouth once daily with OR IMMEDIATELY FOLLOWING A MEAL  30 tablet  1  . Multiple Vitamin (MULITIVITAMIN WITH MINERALS) TABS Take 1 tablet by mouth daily.      . nitroGLYCERIN (NITROSTAT) 0.4 MG SL tablet Place 1 tablet (0.4 mg total) under the tongue every 5 (five) minutes as needed for chest pain.  30 tablet  3  . ONE TOUCH ULTRA TEST test strip use as directed  100 each  12  . ONETOUCH DELICA LANCETS 65Y MISC use as directed  100 each  6  . predniSONE (DELTASONE) 1 MG tablet Take 4 tablets (4 mg total) by mouth daily with breakfast. Please take with 1 5mg  tablet for a total of 9mg  per day.  120 tablet  0  . predniSONE (DELTASONE) 5 MG tablet Take 1 tablet (5 mg total) by mouth daily with breakfast. Please take along with 4 1mg  tablets for a total of 9mg  daily.  30 tablet  0  . promethazine (PHENERGAN) 12.5 MG tablet Take 1 tablet (12.5 mg total) by mouth every 6 (six) hours as needed for nausea or vomiting (Prior to ultram).  30 tablet  0  . sodium chloride (OCEAN) 0.65 % SOLN nasal spray Place 1 spray into both nostrils as needed for congestion.  1 Bottle  0  . traMADol (ULTRAM) 50 MG tablet Take 1 tablet (50 mg total) by mouth every 8 (eight) hours as needed (pain).  45 tablet  0  . valsartan  (DIOVAN) 320 MG tablet Take 1 tablet (320 mg total) by mouth daily.  30 tablet  11   No current facility-administered medications for this visit.   Family History  Problem Relation Age of Onset  . Colon cancer Maternal Grandmother     died at 2  . COPD Father  died @ 18  . Other Mother     died of unknown causes in her 13's. Pt raised by grandmother.   History   Social History  . Marital Status: Widowed    Spouse Name: N/A    Number of Children: N/A  . Years of Education: N/A   Social History Main Topics  . Smoking status: Never Smoker   . Smokeless tobacco: Never Used  . Alcohol Use: No     Comment: "Last drink 1967"  . Drug Use: No  . Sexual Activity: None   Other Topics Concern  . None   Social History Narrative   Never smoked. Lives in [place] with her dtr.  Care for her daughter who has had a lung transplant. Retired Building surveyor.   Review of Systems:  Constitutional:  Denies fever and chills  HEENT:  Congestion  Respiratory:  Cough  Cardiovascular:  Chest soreness from coughing  Gastrointestinal:  Denies nausea, vomiting  Genitourinary:  Denies dysuria  Musculoskeletal:  Chronic back pain  Skin:  Denies rash  Neurological:  Denies headaches   Objective:  Physical Exam: Filed Vitals:   05/11/14 1546  BP: 148/69  Pulse: 64  Temp: 97.5 F (36.4 C)  TempSrc: Oral  Height: 5\' 5"  (1.651 m)  Weight: 137 lb 12.8 oz (62.506 kg)  SpO2: 99%   Vitals reviewed. General: sitting in chair, NAD HEENT: EOMI, +inflamed and erythematous left inferior turbinate, pain on examination, no tenderness to palpation of frontal and maxillary sinus' Cardiac: RRR, +murmur loudest RUSB Pulm: clear to auscultation bilaterally, no wheezes Abd: soft, BS present Ext: warm and well perfused, moving all 4 extremties Neuro: alert and oriented X3, strength and sensation to light touch equal in bilateral upper and lower extremities  Assessment & Plan:  Discussed with Dr.  Henderson Baltimore

## 2014-05-11 NOTE — Assessment & Plan Note (Signed)
Lab Results  Component Value Date   HGBA1C 7.4 04/27/2014   HGBA1C 7.3 12/22/2013   HGBA1C 6.4 06/11/2013    Assessment: Diabetes control: fair control Progress toward A1C goal:     Plan: Medications:  continue current medications  On amaryl

## 2014-05-11 NOTE — Assessment & Plan Note (Signed)
Persistent worsening productive cough and sinus congestion. Hx of multiple sinus infections in the past as well. PCN allergy. Given duration and worsening of symptoms will favor short trial course of antibiotics at this time.   -zpack -continue tussin dm, try otc lozanges, and continue sinus otc medication she takes at home prn -advised to return or call if symptoms worsen or fever, chills, or sob develop

## 2014-05-11 NOTE — Assessment & Plan Note (Signed)
BP Readings from Last 3 Encounters:  05/11/14 148/69  05/04/14 159/68  04/27/14 139/65   Lab Results  Component Value Date   NA 137 03/15/2014   K 3.7 03/15/2014   CREATININE 1.04 03/15/2014   Assessment: Blood pressure control: mildly elevated Progress toward BP goal:  improved  Plan: Medications:  continue current medications IMDUR 120mg  qd, toprol xl 100mg , diovan 320mg  qd

## 2014-05-14 NOTE — Progress Notes (Signed)
Case discussed with Dr. Qureshi soon after the resident saw the patient.  We reviewed the resident's history and exam and pertinent patient test results.  I agree with the assessment, diagnosis, and plan of care documented in the resident's note. 

## 2014-05-17 ENCOUNTER — Other Ambulatory Visit: Payer: Self-pay | Admitting: *Deleted

## 2014-05-17 MED ORDER — ISOSORBIDE MONONITRATE ER 60 MG PO TB24: 120.0000 mg | ORAL_TABLET | Freq: Every day | ORAL | Status: DC

## 2014-05-18 ENCOUNTER — Ambulatory Visit: Payer: Medicare Other | Admitting: Internal Medicine

## 2014-05-18 ENCOUNTER — Telehealth: Payer: Self-pay | Admitting: *Deleted

## 2014-05-18 NOTE — Telephone Encounter (Signed)
Call to pt to ask if she has picked up her Promethazine for her nausea and the Hydroxyzine for nausea. Pt stated that she has the Promethazine on hand and has not had to use.  Has not gotten the Hydroxyzine as she is feeling better and does not need.  Sander Nephew, RN 05/18/2014 3:52 PM

## 2014-05-19 DIAGNOSIS — M67919 Unspecified disorder of synovium and tendon, unspecified shoulder: Secondary | ICD-10-CM | POA: Diagnosis not present

## 2014-05-19 DIAGNOSIS — M719 Bursopathy, unspecified: Secondary | ICD-10-CM | POA: Diagnosis not present

## 2014-05-19 DIAGNOSIS — M4800 Spinal stenosis, site unspecified: Secondary | ICD-10-CM | POA: Diagnosis not present

## 2014-05-19 DIAGNOSIS — Z79899 Other long term (current) drug therapy: Secondary | ICD-10-CM | POA: Diagnosis not present

## 2014-05-19 DIAGNOSIS — M545 Low back pain, unspecified: Secondary | ICD-10-CM | POA: Diagnosis not present

## 2014-05-25 ENCOUNTER — Other Ambulatory Visit: Payer: Self-pay | Admitting: *Deleted

## 2014-05-25 MED ORDER — METOPROLOL SUCCINATE ER 100 MG PO TB24: 100.0000 mg | ORAL_TABLET | Freq: Every day | ORAL | Status: DC

## 2014-06-07 ENCOUNTER — Other Ambulatory Visit: Payer: Self-pay | Admitting: Internal Medicine

## 2014-06-08 ENCOUNTER — Other Ambulatory Visit: Payer: Self-pay | Admitting: *Deleted

## 2014-06-08 MED ORDER — GLUCOSE BLOOD VI STRP: 1.0000 | ORAL_STRIP | Status: DC | PRN

## 2014-06-08 MED ORDER — ONETOUCH DELICA LANCETS 33G MISC: 1.0000 | Status: DC | PRN

## 2014-06-08 NOTE — Telephone Encounter (Signed)
Please resend due to needing diag code etc for insurance to pay

## 2014-06-15 ENCOUNTER — Ambulatory Visit (INDEPENDENT_AMBULATORY_CARE_PROVIDER_SITE_OTHER): Payer: Medicare Other | Admitting: Internal Medicine

## 2014-06-15 ENCOUNTER — Ambulatory Visit (HOSPITAL_COMMUNITY)
Admission: RE | Admit: 2014-06-15 | Discharge: 2014-06-15 | Disposition: A | Discharge status: Home / Self Care | Payer: Medicare Other | Source: Ambulatory Visit | Attending: Internal Medicine | Admitting: Internal Medicine

## 2014-06-15 ENCOUNTER — Encounter: Payer: Self-pay | Admitting: Internal Medicine

## 2014-06-15 VITALS — BP 153/76 | HR 85 | Temp 97.4°F | Ht 65.0 in | Wt 141.5 lb

## 2014-06-15 DIAGNOSIS — J208 Acute bronchitis due to other specified organisms: Secondary | ICD-10-CM

## 2014-06-15 DIAGNOSIS — B9689 Other specified bacterial agents as the cause of diseases classified elsewhere: Secondary | ICD-10-CM

## 2014-06-15 DIAGNOSIS — I2510 Atherosclerotic heart disease of native coronary artery without angina pectoris: Secondary | ICD-10-CM | POA: Diagnosis not present

## 2014-06-15 DIAGNOSIS — R059 Cough, unspecified: Secondary | ICD-10-CM

## 2014-06-15 DIAGNOSIS — A499 Bacterial infection, unspecified: Secondary | ICD-10-CM

## 2014-06-15 DIAGNOSIS — J209 Acute bronchitis, unspecified: Secondary | ICD-10-CM | POA: Diagnosis not present

## 2014-06-15 DIAGNOSIS — J984 Other disorders of lung: Secondary | ICD-10-CM

## 2014-06-15 DIAGNOSIS — R0989 Other specified symptoms and signs involving the circulatory and respiratory systems: Secondary | ICD-10-CM | POA: Diagnosis not present

## 2014-06-15 DIAGNOSIS — M4716 Other spondylosis with myelopathy, lumbar region: Secondary | ICD-10-CM | POA: Diagnosis not present

## 2014-06-15 DIAGNOSIS — R05 Cough: Secondary | ICD-10-CM

## 2014-06-15 DIAGNOSIS — R058 Other specified cough: Secondary | ICD-10-CM

## 2014-06-15 LAB — GLUCOSE, CAPILLARY: Glucose-Capillary: 165 mg/dL — ABNORMAL HIGH (ref 70–99)

## 2014-06-15 NOTE — Patient Instructions (Signed)
Please schedule a follow up visit within the next 6 weeks for monitoring of your DM.   For your medications:   Please bring all of your pill  Bottles with you to each visit.  This will help make sure that we have an up to date list of all the medications you are taking.  Please also bring any over the counter herbal medications you are taking (not including advil, tylenol, etc.)  Please continue taking all of your medications as prescribed.  Please go for a chest xray today, I will call you with the results.    Thank you!

## 2014-06-15 NOTE — Progress Notes (Signed)
   Subjective:    [name], female    DOB: 01/24/1934, 80 y.o.   MRN: [id number]  CC: follow up after spinal injection  HPI  Christine [name] is a 80yo woman with PMH of CKD stage 3, hypothyroidism, DM2, HLD, migraine, HTN, GERD, PMR, CAD who presents for follow up after seeing rheumatology.    She was due to see Dr. Charlestine Night for an injection in her back and MRI.  MRI showed Moderate multilevel lumbar spondylosis.  Dr. Charlestine Night was not convinced that she had been properly diagnosed with PMR.   She continues to be on a steroid dose, currently being decreased by Dr. Charlestine Night.  She is at 5mg  daily and reports being close to stopping.  She previously had some thigh pain and we were considering ABI's.  These were done in April and revealed normal ABI's bilaterally. She tells me today that her thigh and leg pain is almost completely resolved after the back injection.  She is weaning off of her medication without issue and she is only taking tylenol a couple of times a week.  She has not required the tramadol in "weeks."    I encouraged her to continue to follow up with Dr. Charlestine Night.   She was recently seen in our clinic for an acute sinusitis treated with a Zpack and seen by Dr. Eula Fried.  She completed this medication and felt somewhat better, however, about 3 days ago she used chlorox and inhaled the fumes and started coughing again.  She has had a dry cough with only scant clear sputum and has been "feeling poorly" since that time.  No other specific symptoms are noted and she did not cough while we were together in the exam room.  Specifically denies fever, chills, nausea, vomiting, hemoptysis, chest pain, wheezing.  She has been eating and drinking normally and sleeping well.    Christine [name] is seen by specialists:  Dr. Charlestine Night in Rheumatology Dr. Aundra Dubin in Cardiology for CAD  Review of Systems  Constitutional: Positive for fatigue ("feeling poorly"). Negative for fever and chills.  HENT:  Positive for congestion. Negative for ear discharge, ear pain and sneezing.   Eyes: Negative for photophobia and visual disturbance.  Respiratory: Positive for cough. Negative for choking, chest tightness, shortness of breath and wheezing.   Cardiovascular: Negative for chest pain.  Gastrointestinal: Negative for abdominal pain, diarrhea and constipation.  Genitourinary: Negative for dysuria and difficulty urinating.  Musculoskeletal: Negative for arthralgias, back pain and gait problem.  Skin: Negative for color change and pallor.  Neurological: Negative for dizziness and light-headedness.       Objective:   Physical Exam  Constitutional: She is oriented to person, place, and time. She appears well-developed and well-nourished. No distress.  HENT:  Head: Normocephalic.  Cardiovascular: Normal rate, regular rhythm and normal heart sounds.   Pulmonary/Chest: Effort normal and breath sounds normal. No respiratory distress. She has no wheezes. She exhibits no tenderness.  Abdominal: Soft. Bowel sounds are normal. She exhibits no distension.  Neurological: She is alert and oriented to person, place, and time.  Skin: Skin is warm and dry. No erythema.  Psychiatric: She has a normal mood and affect. Her behavior is normal.    CXR today.       Assessment & Plan:  6 weeks for eval of DM 2.

## 2014-06-16 ENCOUNTER — Telehealth: Payer: Self-pay | Admitting: *Deleted

## 2014-06-16 ENCOUNTER — Other Ambulatory Visit: Payer: Self-pay | Admitting: Internal Medicine

## 2014-06-16 DIAGNOSIS — M47816 Spondylosis without myelopathy or radiculopathy, lumbar region: Secondary | ICD-10-CM | POA: Insufficient documentation

## 2014-06-16 MED ORDER — GUAIFENESIN ER 600 MG PO TB12: 600.0000 mg | ORAL_TABLET | Freq: Two times a day (BID) | ORAL | Status: AC

## 2014-06-16 NOTE — Telephone Encounter (Signed)
Have sent in an Rx for Mucinex ER 600mg  q12 hours.   Thanks!

## 2014-06-16 NOTE — Assessment & Plan Note (Addendum)
Recently diagnosed with an acute bronchitis and now with persistent/reccurent cough.  Given history of RLD, pulmonary nodule and recent illness, will check CXR for possible occult pneumonia.  Have advised Christine [name] that her cough may be post-bronchitic and may persist for a few weeks.  We will reassess at next visit.   Update: CXR with no acute changes.

## 2014-06-16 NOTE — Assessment & Plan Note (Signed)
MRI on 6/2 of this year showed multi level disease.  She has been having myelopathy associated with it that has been diagnosed as PMR in the past.  It is unclear if she actually had PMR previously and this symptom was confused with relapse.  Regardless, she is now following with a rheumatologist Dr. Charlestine Night who provided a steroid injection and her symptoms are much improved.  She will continue to follow with that provider.

## 2014-06-16 NOTE — Telephone Encounter (Signed)
Attempted to call patient at number provided, no answer.   No further antibiotics are indicated at this time.  She should have been adequately treated for any bacterial sinusitis with azithromycin.   She can try over the counter coricidin HBP with dextromethorphan and chlorpheniramine and continued mucinex.  I am happy to send in the mucinex at prescription strength if she would like.  She can also try a short course of afrin for nasal congestion.  She can use this for up to 3-5 days but then should stop b/c there can be significant rebound effect from the medication.   Thanks  Gilles Chiquito, MD

## 2014-06-16 NOTE — Telephone Encounter (Signed)
Pt called back and yes she does want the prescription strength of mucinex She said to tell you thank you

## 2014-06-16 NOTE — Telephone Encounter (Signed)
Pt calls and states she did not sleep last night due to coughing and a "stopped up nose", she states she has tried [facility] brand tussin dm, otc sinus/congestion med and her prescribed nasal spray- none of these are working well. She states it is hard to work with no rest. Please advise. Ph# [phone]

## 2014-06-17 NOTE — Telephone Encounter (Signed)
Pt informed, pharm will lay some coricidon out for her

## 2014-06-22 ENCOUNTER — Other Ambulatory Visit: Payer: Self-pay | Admitting: Internal Medicine

## 2014-06-23 ENCOUNTER — Encounter: Payer: Self-pay | Admitting: Gastroenterology

## 2014-06-29 DIAGNOSIS — M719 Bursopathy, unspecified: Secondary | ICD-10-CM | POA: Diagnosis not present

## 2014-06-29 DIAGNOSIS — M67919 Unspecified disorder of synovium and tendon, unspecified shoulder: Secondary | ICD-10-CM | POA: Diagnosis not present

## 2014-06-29 DIAGNOSIS — M4800 Spinal stenosis, site unspecified: Secondary | ICD-10-CM | POA: Diagnosis not present

## 2014-06-29 DIAGNOSIS — M545 Low back pain, unspecified: Secondary | ICD-10-CM | POA: Diagnosis not present

## 2014-06-30 ENCOUNTER — Other Ambulatory Visit: Payer: Self-pay | Admitting: Internal Medicine

## 2014-07-04 ENCOUNTER — Other Ambulatory Visit: Payer: Self-pay | Admitting: Rheumatology

## 2014-07-04 DIAGNOSIS — M48061 Spinal stenosis, lumbar region without neurogenic claudication: Secondary | ICD-10-CM

## 2014-07-04 DIAGNOSIS — M79651 Pain in right thigh: Secondary | ICD-10-CM

## 2014-07-11 ENCOUNTER — Ambulatory Visit
Admission: RE | Admit: 2014-07-11 | Discharge: 2014-07-11 | Disposition: A | Discharge status: Home / Self Care | Payer: Medicare Other | Source: Ambulatory Visit | Attending: Rheumatology | Admitting: Rheumatology

## 2014-07-11 DIAGNOSIS — M48061 Spinal stenosis, lumbar region without neurogenic claudication: Secondary | ICD-10-CM

## 2014-07-11 DIAGNOSIS — M545 Low back pain, unspecified: Secondary | ICD-10-CM | POA: Diagnosis not present

## 2014-07-11 DIAGNOSIS — M79651 Pain in right thigh: Secondary | ICD-10-CM

## 2014-07-11 MED ORDER — METHYLPREDNISOLONE ACETATE 40 MG/ML INJ SUSP (RADIOLOG
120.0000 mg | Freq: Once | INTRAMUSCULAR | Status: AC
  Administered 2014-07-11: 120 mg via EPIDURAL

## 2014-07-11 MED ORDER — IOHEXOL 180 MG/ML  SOLN
1.0000 mL | Freq: Once | INTRAMUSCULAR | Status: AC | PRN
  Administered 2014-07-11: 1 mL via EPIDURAL

## 2014-07-11 NOTE — Discharge Instructions (Signed)

## 2014-07-22 ENCOUNTER — Other Ambulatory Visit: Payer: Self-pay | Admitting: *Deleted

## 2014-07-26 MED ORDER — METOPROLOL SUCCINATE ER 100 MG PO TB24: 100.0000 mg | ORAL_TABLET | Freq: Every day | ORAL | Status: DC

## 2014-07-27 ENCOUNTER — Ambulatory Visit (INDEPENDENT_AMBULATORY_CARE_PROVIDER_SITE_OTHER): Payer: Medicare Other | Admitting: Internal Medicine

## 2014-07-27 ENCOUNTER — Encounter: Payer: Self-pay | Admitting: Internal Medicine

## 2014-07-27 VITALS — BP 144/66 | HR 53 | Temp 98.0°F | Wt 135.7 lb

## 2014-07-27 DIAGNOSIS — M25559 Pain in unspecified hip: Secondary | ICD-10-CM

## 2014-07-27 DIAGNOSIS — M4716 Other spondylosis with myelopathy, lumbar region: Secondary | ICD-10-CM | POA: Diagnosis not present

## 2014-07-27 DIAGNOSIS — N183 Chronic kidney disease, stage 3 unspecified: Secondary | ICD-10-CM | POA: Diagnosis not present

## 2014-07-27 DIAGNOSIS — Z23 Encounter for immunization: Secondary | ICD-10-CM

## 2014-07-27 DIAGNOSIS — R059 Cough, unspecified: Secondary | ICD-10-CM | POA: Diagnosis not present

## 2014-07-27 DIAGNOSIS — R234 Changes in skin texture: Secondary | ICD-10-CM | POA: Diagnosis not present

## 2014-07-27 DIAGNOSIS — I10 Essential (primary) hypertension: Secondary | ICD-10-CM

## 2014-07-27 DIAGNOSIS — IMO0001 Reserved for inherently not codable concepts without codable children: Secondary | ICD-10-CM

## 2014-07-27 DIAGNOSIS — I2510 Atherosclerotic heart disease of native coronary artery without angina pectoris: Secondary | ICD-10-CM

## 2014-07-27 DIAGNOSIS — J209 Acute bronchitis, unspecified: Secondary | ICD-10-CM | POA: Diagnosis not present

## 2014-07-27 DIAGNOSIS — J984 Other disorders of lung: Secondary | ICD-10-CM | POA: Diagnosis not present

## 2014-07-27 DIAGNOSIS — E1165 Type 2 diabetes mellitus with hyperglycemia: Principal | ICD-10-CM

## 2014-07-27 DIAGNOSIS — A499 Bacterial infection, unspecified: Secondary | ICD-10-CM | POA: Diagnosis not present

## 2014-07-27 DIAGNOSIS — M79651 Pain in right thigh: Secondary | ICD-10-CM

## 2014-07-27 DIAGNOSIS — R239 Unspecified skin changes: Secondary | ICD-10-CM

## 2014-07-27 DIAGNOSIS — B9689 Other specified bacterial agents as the cause of diseases classified elsewhere: Secondary | ICD-10-CM | POA: Diagnosis not present

## 2014-07-27 LAB — BASIC METABOLIC PANEL WITH GFR
BUN: 17 mg/dL (ref 6–23)
CHLORIDE: 105 meq/L (ref 96–112)
CO2: 25 meq/L (ref 19–32)
Calcium: 9.2 mg/dL (ref 8.4–10.5)
Creat: 1.09 mg/dL (ref 0.50–1.10)
GFR, Est African American: 56 mL/min — ABNORMAL LOW
GFR, Est Non African American: 49 mL/min — ABNORMAL LOW
Glucose, Bld: 88 mg/dL (ref 70–99)
Potassium: 3.7 mEq/L (ref 3.5–5.3)
Sodium: 139 mEq/L (ref 135–145)

## 2014-07-27 LAB — GLUCOSE, CAPILLARY: Glucose-Capillary: 74 mg/dL (ref 70–99)

## 2014-07-27 LAB — POCT GLYCOSYLATED HEMOGLOBIN (HGB A1C): Hemoglobin A1C: 7.1

## 2014-07-27 MED ORDER — PROMETHAZINE HCL 12.5 MG PO TABS: 12.5000 mg | ORAL_TABLET | Freq: Four times a day (QID) | ORAL | Status: DC | PRN

## 2014-07-27 NOTE — Patient Instructions (Signed)
General Instructions: Please schedule a follow up visit within the next 3 months.   For your medications:   Please bring all of your pill  Bottles with you to each visit.  This will help make sure that we have an up to date list of all the medications you are taking.  Please also bring any over the counter herbal medications you are taking (not including advil, tylenol, etc.)  Please continue taking all of your medications as prescribed.  You will have a refill for promethazine.   You will have a referral to see the Dermatologist for the skin changes on your face and lip.  Please keep this appointment.   Thank you!   Treatment Goals:  Goals (1 Years of Data) as of 07/27/14   None      Progress Toward Treatment Goals:  Treatment Goal 07/27/2014  Hemoglobin A1C at goal  Blood pressure unchanged    Self Care Goals & Plans:  Self Care Goal 06/15/2014  Manage my medications take my medicines as prescribed; bring my medications to every visit; refill my medications on time  Monitor my health check my feet daily; keep track of my blood glucose; bring my glucose meter and log to each visit  Eat healthy foods eat foods that are low in salt  Be physically active find an activity I enjoy    Home Blood Glucose Monitoring 04/27/2014  Check my blood sugar 2 times a day  When to check my blood sugar before breakfast; before lunch     Care Management & Community Referrals:  No flowsheet data found.

## 2014-07-27 NOTE — Progress Notes (Signed)
   Subjective:    [name], female    DOB: 01/24/1934, 80 y.o.   MRN: [id number]  CC: Follow up for DM 2 and CRI  HPI  Christine [name] is a 80yo woman with PMH of DM2, chronic renal insufficiency, HTN, CAD and recent acute bacterial bronchitis who presents today for follow up of DM2.   Prior to being seen, she noted that she had a "funny feeling" in her stomach and that her BS was in the low 70s.  She was given crackers and diet ginger ale.  She notes that she didn't eat breakfast this morning.  She was already starting to feel better with crackers.   Legs are much better after treatment with Dr. Charlestine Night, she has received a steroid injection from this provider.  She is now taking hydrocodone from his office.  She is out of promethazine which she needs to take beforehand to avoid nausea.  Needs refill of promethazine.   For her DM, goal can be < 8 given age.  7.1 today.  She is doing well and knows the symptoms of hypoglycemia.  She is taking her medications as prescribed.  She has noticed no polyuria or dysuria.  She is off of steroids.   She has noted skin changes over the last several years on her face in a malar distribution along with a lip nodule.   Reviewed medications   Review of Systems  Constitutional: Negative for fever, chills and fatigue.  HENT: Negative for drooling and ear discharge.   Eyes: Negative for photophobia and visual disturbance.  Respiratory: Negative for cough, chest tightness and shortness of breath.   Cardiovascular: Negative for chest pain and palpitations.  Gastrointestinal: Positive for nausea (with hydrocodone). Negative for abdominal pain and diarrhea.  Genitourinary: Negative for dysuria and difficulty urinating.  Musculoskeletal: Positive for arthralgias (with leg pain, but improved). Negative for back pain.  Skin: Positive for color change (face, knot on lip). Negative for rash and wound.  Neurological: Negative for dizziness and  light-headedness.       Objective:   Physical Exam  Constitutional: She is oriented to person, place, and time. She appears well-developed and well-nourished. No distress.  HENT:  Head: Normocephalic.  Eyes: Conjunctivae are normal. No scleral icterus.  Neck: Neck supple.  Cardiovascular: Normal rate, regular rhythm and normal heart sounds.   Pulmonary/Chest: Effort normal and breath sounds normal. No respiratory distress. She has no wheezes.  Abdominal: Soft. Bowel sounds are normal.  Musculoskeletal: She exhibits no edema.  Neurological: She is alert and oriented to person, place, and time.  Skin: Skin is warm and dry. No erythema.  + dark blue/brown lip nodule ~ 3X3 mm on left lower lip.  + patch discoloration in malar distribution over cheeks and nose.  + discoloration to left thumb nail  Psychiatric: She has a normal mood and affect. Her behavior is normal.       Assessment & Plan:  RTC 3 months for DM follow up.

## 2014-07-28 DIAGNOSIS — R239 Unspecified skin changes: Secondary | ICD-10-CM | POA: Insufficient documentation

## 2014-07-28 MED ORDER — GLIMEPIRIDE 1 MG PO TABS
ORAL_TABLET | ORAL | Status: DC
Start: 2014-07-28 — End: 2014-08-23

## 2014-07-28 MED ORDER — METOPROLOL SUCCINATE ER 100 MG PO TB24: 100.0000 mg | ORAL_TABLET | Freq: Every day | ORAL | Status: DC

## 2014-07-28 MED ORDER — CLOPIDOGREL BISULFATE 75 MG PO TABS: 75.0000 mg | ORAL_TABLET | Freq: Every day | ORAL | Status: DC

## 2014-07-28 NOTE — Assessment & Plan Note (Signed)
Refer to Dermatology

## 2014-07-28 NOTE — Assessment & Plan Note (Signed)
BMET today to further evaluate, no change in urinary habits.

## 2014-07-28 NOTE — Assessment & Plan Note (Signed)
Lab Results  Component Value Date   HGBA1C 7.1 07/27/2014   HGBA1C 7.4 04/27/2014   HGBA1C 7.3 12/22/2013     Assessment: Diabetes control: good control (HgbA1C at goal) Progress toward A1C goal:  at goal Comments: Doing well, likely hypoglycemic in clinic.  We discussed setting a higher goal for her A1C given her symptoms and age.  She may not need glimepiride anymore.    Plan: Medications:  continue current medications, for now, glimepiride Home glucose monitoring: Frequency:   Timing:   Instruction/counseling given: reminded to get eye exam and discussed foot care Educational resources provided:   Self management tools provided: copy of home glucose meter download Other plans:   LDL at goal, BP at goal. Renal function check today.

## 2014-07-28 NOTE — Assessment & Plan Note (Signed)
BP Readings from Last 3 Encounters:  07/27/14 144/66  07/11/14 178/70  06/15/14 153/76    Lab Results  Component Value Date   NA 139 07/27/2014   K 3.7 07/27/2014   CREATININE 1.09 07/27/2014    Assessment: Blood pressure control: mildly elevated Progress toward BP goal:  unchanged Comments: initially high, but improved on recheck.  Continue to monitor  Plan: Medications:  continue current medications Educational resources provided:   Self management tools provided:   Other plans: BMET today for renal function.

## 2014-08-01 ENCOUNTER — Encounter: Payer: Self-pay | Admitting: Internal Medicine

## 2014-08-04 ENCOUNTER — Encounter: Payer: Self-pay | Admitting: *Deleted

## 2014-08-05 ENCOUNTER — Ambulatory Visit (INDEPENDENT_AMBULATORY_CARE_PROVIDER_SITE_OTHER): Payer: Medicare Other | Admitting: Cardiology

## 2014-08-05 ENCOUNTER — Encounter: Payer: Self-pay | Admitting: Cardiology

## 2014-08-05 VITALS — BP 146/62 | HR 70 | Ht 65.0 in | Wt 134.0 lb

## 2014-08-05 DIAGNOSIS — E785 Hyperlipidemia, unspecified: Secondary | ICD-10-CM | POA: Diagnosis not present

## 2014-08-05 DIAGNOSIS — I10 Essential (primary) hypertension: Secondary | ICD-10-CM

## 2014-08-05 DIAGNOSIS — I2510 Atherosclerotic heart disease of native coronary artery without angina pectoris: Secondary | ICD-10-CM | POA: Diagnosis not present

## 2014-08-05 LAB — LIPID PANEL
CHOL/HDL RATIO: 4
Cholesterol: 136 mg/dL (ref 0–200)
HDL: 35.1 mg/dL — ABNORMAL LOW (ref >39.00)
LDL CALC: 67 mg/dL (ref 0–99)
NonHDL: 100.9
Triglycerides: 169 mg/dL — ABNORMAL HIGH (ref 0.0–149.0)
VLDL: 33.8 mg/dL (ref 0.0–40.0)

## 2014-08-05 NOTE — Progress Notes (Signed)
Patient ID: MELESA LECY, female   DOB: 01/24/1934, 80 y.o.   MRN: [id number] PCP: Dr. Daryll Drown  80 yo with history of CAD, HTN, and diabetes presents for cardiology followup.  I initially saw Mrs Shackleford in the hospital in 10/12 with chest pain.  Left heart cath showed a 40-50% distal left main stenosis that was not thought to be flow-limiting.  She additionally had a 75% stenosis in a diagonal.  Chest pain resolved with medical therapy.  She was admitted again in 4/13 with chest pain and exertional dyspnea.  Cardiac enzymes were negative.  LHC looked similar to prior with 50% distal LM, 70% ostial D1, and 70% ostial ramus.  Echo showed preserved EF.  A dobutamine stress echo was done, this was submaximal but did not show evidence for ischemia.  No intervention, planned medical management.   Anti-anginal meds were titrated up and she was discharged home.  She developed chest pain (atypical) again in 2014.  Lexiscan Cardiolite was normal in 3/14.    Lately, she has been doing quite well.  No dyspnea walking on flat ground, mildly short of breath with stairs.  No chest pain.  Not very active.  PMR symptoms stable.  She has had sciatica but this has been doing better since she had a back injection.    ECG: NSR with PACs, Qs V1/V2  Labs (2/13): K 3.3, creatinine 0.9 Labs (3/13): HDL 42, LDL 49 Labs (4/13): K 4.1, creatinine 1.01 Labs (10/13): LDL 56, HDL 38 Labs (2/14): K 3.8, creatinine 0.98 Labs (7/14): K 4.4, creatinine 1.17 Labs (12/14): LDL 75, HDL 51 Labs (9/15): K 3.7, creatinine 1.09  PMH: 1. CAD: Chest pain 10/12.  LHC with 40-50% distal LM stenosis, 70-75% ostial D1.  No flow-limiting lesions, medically managed.  Echo (10/12): EF 60-65%, mild LV hypertrophy, PA systolic pressure 24 mmHg.  Chest pain 4/13.  LHC with 50% distal LM, 70% ostial D1, 50% proximal LAD, 70% proximal ramus.  No flow-limiting lesions, medically managed.  Echo (4/13) with EF 60-65%, mild LVH.  DSE (4/13) with no ischemia  but it was a submaximal study.  Lexiscan Cardiolite (3/14) with EF 88%, no ischemia or infarction.  2. DM2 3. HTN 4. Hyperlipidemia 5. CKD 6. Carotid dopplers (10/12): 0-39% bilateral carotid stenosis.  7. Allergy to aspirin => rash.  8. GERD 9. Migraines 10. Hypothyroidism 11. COPD 80. PMR: Dr. Charlestine Night.  13. ABIs (4/15): Normal 14. Sciatica  SH: Nonsmoker.  Lives in [place].   FH: CAD  Current Outpatient Prescriptions  Medication Sig Dispense Refill  . acetaminophen (TYLENOL) 500 MG tablet Take 1,000 mg by mouth every 6 (six) hours as needed for headache. For pain      . albuterol (PROVENTIL HFA;VENTOLIN HFA) 108 (90 BASE) MCG/ACT inhaler Inhale 2 puffs into the lungs every 6 (six) hours as needed for wheezing or shortness of breath.  1 Inhaler  11  . atorvastatin (LIPITOR) 40 MG tablet Take 1 tablet (40 mg total) by mouth every evening.  90 tablet  3  . clopidogrel (PLAVIX) 75 MG tablet Take 1 tablet (75 mg total) by mouth daily.  30 tablet  5  . esomeprazole (NEXIUM) 40 MG capsule take 1 capsule by mouth once daily BEFORE BREAKFAST  30 capsule  11  . Fluticasone-Salmeterol (ADVAIR DISKUS) 250-50 MCG/DOSE AEPB inhale 1 dose by mouth every 12 hours  60 each  2  . glimepiride (AMARYL) 1 MG tablet take 1 tablet by mouth once daily before  BREAKFAST  30 tablet  3  . glucose blood (ONE TOUCH ULTRA TEST) test strip 1 each by Other route as needed for other. Use 1 to 2 times daily to check blood sugar. diag code 250.02. Non-insulin dependent  200 each  4  . guaiFENesin (MUCINEX) 600 MG 12 hr tablet Take 1 tablet (600 mg total) by mouth 2 (two) times daily.  28 tablet  0  . hydroxypropyl methylcellulose (ISOPTO TEARS) 2.5 % ophthalmic solution Place 1 drop into both eyes 4 (four) times daily as needed for dry eyes.      Marland Kitchen imipramine (TOFRANIL) 50 MG tablet take 1 tablet by mouth twice a day  60 tablet  6  . isosorbide mononitrate (IMDUR) 60 MG 24 hr tablet Take 2 tablets (120 mg total) by  mouth daily.  60 tablet  6  . levothyroxine (SYNTHROID, LEVOTHROID) 25 MCG tablet take 1 tablet by mouth once daily  90 tablet  1  . loratadine (CLARITIN) 10 MG tablet take 1 tablet by mouth once daily if needed for allergies  30 tablet  1  . metoprolol succinate (TOPROL-XL) 100 MG 24 hr tablet Take 1 tablet (100 mg total) by mouth daily. Take with or immediately following a meal.  90 tablet  4  . Multiple Vitamin (MULITIVITAMIN WITH MINERALS) TABS Take 1 tablet by mouth daily.      . nitroGLYCERIN (NITROSTAT) 0.4 MG SL tablet Place 1 tablet (0.4 mg total) under the tongue every 5 (five) minutes as needed for chest pain.  30 tablet  3  . ONETOUCH DELICA LANCETS 60Y MISC Inject 1 each into the skin as needed. Use 1 to 2 times daily to check blood sugar. diag code 250.02. Non-insulin dependent  200 each  4  . promethazine (PHENERGAN) 12.5 MG tablet Take 1 tablet (12.5 mg total) by mouth every 6 (six) hours as needed for nausea or vomiting (Prior to pain medications).  45 tablet  3  . Pseudoephedrine-Acetaminophen (SINUS PAIN/PRESSURE PO) Take by mouth.      . sodium chloride (OCEAN) 0.65 % SOLN nasal spray Place 1 spray into both nostrils as needed for congestion.  1 Bottle  0  . valsartan (DIOVAN) 320 MG tablet Take 1 tablet (320 mg total) by mouth daily.  30 tablet  11   No current facility-administered medications for this visit.   BP 146/62  Pulse 70  Ht 5\' 5"  (1.651 m)  Wt 134 lb (60.782 kg)  BMI 22.30 kg/m2  LMP 02/11/1975 General: NAD Neck: No JVD, no thyromegaly or thyroid nodule.  Lungs: Clear to auscultation bilaterally with normal respiratory effort. CV: Nondisplaced PMI.  Heart regular S1/S2, no S3/S4, 2/6 early SEM.  No peripheral edema.  No carotid bruit.  1+ PT bilaterally.   Abdomen: Soft, nontender, no hepatosplenomegaly, no distention.  Neurologic: Alert and oriented x 3.  Psych: Normal affect. Extremities: No clubbing or cyanosis.   Assessment/Plan: 1. CAD: Non-flow  limiting, moderate disease on cath in 4/13. No ischemia or infarction on Lexiscan Cardiolite in 3/14.  No recent chest pain.  - Continue Plavix (allergic to aspirin), Toprol XL, valsartan, Imdur. - She is on atorvastatin.   2. Hyperlipidemia: Check lipids today.   Loralie Champagne 08/05/2014 11:26 AM

## 2014-08-05 NOTE — Patient Instructions (Signed)
Your physician recommends that you return for lab work today--Lipid profile.  Your physician wants you to follow-up in: 6 months with Dr Aundra Dubin. (March 2016). You will receive a reminder letter in the mail two months in advance. If you don't receive a letter, please call our office to schedule the follow-up appointment.

## 2014-08-10 DIAGNOSIS — M545 Low back pain, unspecified: Secondary | ICD-10-CM | POA: Diagnosis not present

## 2014-08-10 DIAGNOSIS — IMO0002 Reserved for concepts with insufficient information to code with codable children: Secondary | ICD-10-CM | POA: Diagnosis not present

## 2014-08-10 DIAGNOSIS — M7989 Other specified soft tissue disorders: Secondary | ICD-10-CM | POA: Diagnosis not present

## 2014-08-23 ENCOUNTER — Other Ambulatory Visit: Payer: Self-pay | Admitting: Internal Medicine

## 2014-08-23 MED ORDER — GLIMEPIRIDE 1 MG PO TABS: ORAL_TABLET | ORAL | Status: DC

## 2014-08-24 ENCOUNTER — Other Ambulatory Visit: Payer: Self-pay | Admitting: Internal Medicine

## 2014-08-31 DIAGNOSIS — M12511 Traumatic arthropathy, right shoulder: Secondary | ICD-10-CM | POA: Diagnosis not present

## 2014-08-31 DIAGNOSIS — M7541 Impingement syndrome of right shoulder: Secondary | ICD-10-CM | POA: Diagnosis not present

## 2014-08-31 DIAGNOSIS — M25511 Pain in right shoulder: Secondary | ICD-10-CM | POA: Diagnosis not present

## 2014-09-18 ENCOUNTER — Emergency Department (HOSPITAL_COMMUNITY)
Admission: EM | Admit: 2014-09-18 | Discharge: 2014-09-18 | Disposition: A | Discharge status: Home / Self Care | Payer: Medicare Other | Attending: Emergency Medicine | Admitting: Emergency Medicine

## 2014-09-18 ENCOUNTER — Emergency Department (HOSPITAL_COMMUNITY): Payer: Medicare Other

## 2014-09-18 ENCOUNTER — Encounter (HOSPITAL_COMMUNITY): Payer: Self-pay

## 2014-09-18 DIAGNOSIS — R51 Headache: Secondary | ICD-10-CM | POA: Diagnosis not present

## 2014-09-18 DIAGNOSIS — J449 Chronic obstructive pulmonary disease, unspecified: Secondary | ICD-10-CM | POA: Diagnosis not present

## 2014-09-18 DIAGNOSIS — R519 Headache, unspecified: Secondary | ICD-10-CM

## 2014-09-18 DIAGNOSIS — K9049 Malabsorption due to intolerance, not elsewhere classified: Secondary | ICD-10-CM

## 2014-09-18 DIAGNOSIS — D649 Anemia, unspecified: Secondary | ICD-10-CM | POA: Insufficient documentation

## 2014-09-18 DIAGNOSIS — M1990 Unspecified osteoarthritis, unspecified site: Secondary | ICD-10-CM | POA: Insufficient documentation

## 2014-09-18 DIAGNOSIS — Z8669 Personal history of other diseases of the nervous system and sense organs: Secondary | ICD-10-CM | POA: Diagnosis not present

## 2014-09-18 DIAGNOSIS — I130 Hypertensive heart and chronic kidney disease with heart failure and stage 1 through stage 4 chronic kidney disease, or unspecified chronic kidney disease: Secondary | ICD-10-CM | POA: Diagnosis not present

## 2014-09-18 DIAGNOSIS — R011 Cardiac murmur, unspecified: Secondary | ICD-10-CM | POA: Insufficient documentation

## 2014-09-18 DIAGNOSIS — Z880 Allergy status to penicillin: Secondary | ICD-10-CM | POA: Diagnosis not present

## 2014-09-18 DIAGNOSIS — I2510 Atherosclerotic heart disease of native coronary artery without angina pectoris: Secondary | ICD-10-CM | POA: Diagnosis not present

## 2014-09-18 DIAGNOSIS — R55 Syncope and collapse: Secondary | ICD-10-CM

## 2014-09-18 DIAGNOSIS — E119 Type 2 diabetes mellitus without complications: Secondary | ICD-10-CM | POA: Insufficient documentation

## 2014-09-18 DIAGNOSIS — N183 Chronic kidney disease, stage 3 (moderate): Secondary | ICD-10-CM | POA: Diagnosis not present

## 2014-09-18 DIAGNOSIS — E039 Hypothyroidism, unspecified: Secondary | ICD-10-CM | POA: Insufficient documentation

## 2014-09-18 DIAGNOSIS — R42 Dizziness and giddiness: Secondary | ICD-10-CM | POA: Insufficient documentation

## 2014-09-18 DIAGNOSIS — R110 Nausea: Secondary | ICD-10-CM | POA: Diagnosis not present

## 2014-09-18 DIAGNOSIS — K219 Gastro-esophageal reflux disease without esophagitis: Secondary | ICD-10-CM | POA: Insufficient documentation

## 2014-09-18 DIAGNOSIS — E785 Hyperlipidemia, unspecified: Secondary | ICD-10-CM | POA: Diagnosis not present

## 2014-09-18 DIAGNOSIS — R531 Weakness: Secondary | ICD-10-CM | POA: Diagnosis present

## 2014-09-18 DIAGNOSIS — Z79899 Other long term (current) drug therapy: Secondary | ICD-10-CM | POA: Diagnosis not present

## 2014-09-18 DIAGNOSIS — I5030 Unspecified diastolic (congestive) heart failure: Secondary | ICD-10-CM | POA: Insufficient documentation

## 2014-09-18 DIAGNOSIS — R6889 Other general symptoms and signs: Secondary | ICD-10-CM | POA: Diagnosis not present

## 2014-09-18 DIAGNOSIS — Z7902 Long term (current) use of antithrombotics/antiplatelets: Secondary | ICD-10-CM | POA: Insufficient documentation

## 2014-09-18 DIAGNOSIS — E728 Other specified disorders of amino-acid metabolism: Secondary | ICD-10-CM | POA: Diagnosis not present

## 2014-09-18 DIAGNOSIS — K904 Malabsorption due to intolerance, not elsewhere classified: Secondary | ICD-10-CM | POA: Diagnosis not present

## 2014-09-18 DIAGNOSIS — I10 Essential (primary) hypertension: Secondary | ICD-10-CM | POA: Diagnosis not present

## 2014-09-18 LAB — URINALYSIS, ROUTINE W REFLEX MICROSCOPIC
Bilirubin Urine: NEGATIVE
Glucose, UA: NEGATIVE mg/dL
Hgb urine dipstick: NEGATIVE
KETONES UR: NEGATIVE mg/dL
NITRITE: NEGATIVE
PH: 5 (ref 5.0–8.0)
Protein, ur: NEGATIVE mg/dL
Specific Gravity, Urine: 1.017 (ref 1.005–1.030)
Urobilinogen, UA: 1 mg/dL (ref 0.0–1.0)

## 2014-09-18 LAB — BASIC METABOLIC PANEL
ANION GAP: 11 (ref 5–15)
BUN: 17 mg/dL (ref 6–23)
CO2: 26 meq/L (ref 19–32)
Calcium: 9.2 mg/dL (ref 8.4–10.5)
Chloride: 104 mEq/L (ref 96–112)
Creatinine, Ser: 1.06 mg/dL (ref 0.50–1.10)
GFR calc Af Amer: 57 mL/min — ABNORMAL LOW (ref >90)
GFR calc non Af Amer: 49 mL/min — ABNORMAL LOW (ref >90)
Glucose, Bld: 236 mg/dL — ABNORMAL HIGH (ref 70–99)
Potassium: 4.6 mEq/L (ref 3.7–5.3)
SODIUM: 141 meq/L (ref 137–147)

## 2014-09-18 LAB — CBC WITH DIFFERENTIAL/PLATELET
Basophils Absolute: 0 K/uL (ref 0.0–0.1)
Basophils Relative: 1 % (ref 0–1)
Eosinophils Absolute: 0.2 K/uL (ref 0.0–0.7)
Eosinophils Relative: 3 % (ref 0–5)
HCT: 35.8 % — ABNORMAL LOW (ref 36.0–46.0)
Hemoglobin: 11.7 g/dL — ABNORMAL LOW (ref 12.0–15.0)
Lymphocytes Relative: 24 % (ref 12–46)
Lymphs Abs: 1.6 K/uL (ref 0.7–4.0)
MCH: 30.4 pg (ref 26.0–34.0)
MCHC: 32.7 g/dL (ref 30.0–36.0)
MCV: 93 fL (ref 78.0–100.0)
Monocytes Absolute: 0.6 K/uL (ref 0.1–1.0)
Monocytes Relative: 9 % (ref 3–12)
Neutro Abs: 4.3 K/uL (ref 1.7–7.7)
Neutrophils Relative %: 63 % (ref 43–77)
Platelets: 234 K/uL (ref 150–400)
RBC: 3.85 MIL/uL — ABNORMAL LOW (ref 3.87–5.11)
RDW: 13.6 % (ref 11.5–15.5)
WBC: 6.7 K/uL (ref 4.0–10.5)

## 2014-09-18 LAB — URINE MICROSCOPIC-ADD ON

## 2014-09-18 MED ORDER — SODIUM CHLORIDE 0.9 % IV BOLUS (SEPSIS)
500.0000 mL | Freq: Once | INTRAVENOUS | Status: AC
  Administered 2014-09-18: 500 mL via INTRAVENOUS

## 2014-09-18 MED ORDER — ONDANSETRON HCL 4 MG/2ML IJ SOLN
4.0000 mg | Freq: Once | INTRAMUSCULAR | Status: AC
  Administered 2014-09-18: 4 mg via INTRAVENOUS
  Filled 2014-09-18: qty 2

## 2014-09-18 NOTE — ED Notes (Signed)
Her family are with her and she smilingly tells me she continues to feel "better".

## 2014-09-18 NOTE — Discharge Instructions (Signed)
Your symptoms may have been from an intolerance to MSG. Possibly, a simple vagal reaction (see below). Return to the ER with any recurrence of symptoms  Vasovagal Syncope, Adult Syncope, commonly known as fainting, is a temporary loss of consciousness. It occurs when the blood flow to the brain is reduced. Vasovagal syncope (also called neurocardiogenic syncope) is a fainting spell in which the blood flow to the brain is reduced because of a sudden drop in heart rate and blood pressure. Vasovagal syncope occurs when the brain and the cardiovascular system (blood vessels) do not adequately communicate and respond to each other. This is the most common cause of fainting. It often occurs in response to fear or some other type of emotional or physical stress. The body has a reaction in which the heart starts beating too slowly or the blood vessels expand, reducing blood pressure. This type of fainting spell is generally considered harmless. However, injuries can occur if a person takes a sudden fall during a fainting spell.  CAUSES  Vasovagal syncope occurs when a person's blood pressure and heart rate decrease suddenly, usually in response to a trigger. Many things and situations can trigger an episode. Some of these include:   Pain.   Fear.   The sight of blood or medical procedures, such as blood being drawn from a vein.   Common activities, such as coughing, swallowing, stretching, or going to the bathroom.   Emotional stress.   Prolonged standing, especially in a warm environment.   Lack of sleep or rest.   Prolonged lack of food.   Prolonged lack of fluids.   Recent illness.  The use of certain drugs that affect blood pressure, such as cocaine, alcohol, marijuana, inhalants, and opiates.  SYMPTOMS  Before the fainting episode, you may:   Feel dizzy or light headed.   Become pale.  Sense that you are going to faint.   Feel like the room is spinning.   Have  tunnel vision, only seeing directly in front of you.   Feel sick to your stomach (nauseous).   See spots or slowly lose vision.   Hear ringing in your ears.   Have a headache.   Feel warm and sweaty.   Feel a sensation of pins and needles. During the fainting spell, you will generally be unconscious for no longer than a couple minutes before waking up and returning to normal. If you get up too quickly before your body can recover, you may faint again. Some twitching or jerky movements may occur during the fainting spell.  DIAGNOSIS  Your caregiver will ask about your symptoms, take a medical history, and perform a physical exam. Various tests may be done to rule out other causes of fainting. These may include blood tests and tests to check the heart, such as electrocardiography, echocardiography, and possibly an electrophysiology study. When other causes have been ruled out, a test may be done to check the body's response to changes in position (tilt table test). TREATMENT  Most cases of vasovagal syncope do not require treatment. Your caregiver may recommend ways to avoid fainting triggers and may provide home strategies for preventing fainting. If you must be exposed to a possible trigger, you can drink additional fluids to help reduce your chances of having an episode of vasovagal syncope. If you have warning signs of an oncoming episode, you can respond by positioning yourself favorably (lying down). If your fainting spells continue, you may be given medicines to prevent fainting. Some  medicines may help make you more resistant to repeated episodes of vasovagal syncope. Special exercises or compression stockings may be recommended. In rare cases, the surgical placement of a pacemaker is considered. HOME CARE INSTRUCTIONS   Learn to identify the warning signs of vasovagal syncope.   Sit or lie down at the first warning sign of a fainting spell. If sitting, put your head down between  your legs. If you lie down, swing your legs up in the air to increase blood flow to the brain.   Avoid hot tubs and saunas.  Avoid prolonged standing.  Drink enough fluids to keep your urine clear or pale yellow. Avoid caffeine.  Increase salt in your diet as directed by your caregiver.   If you have to stand for a long time, perform movements such as:   Crossing your legs.   Flexing and stretching your leg muscles.   Squatting.   Moving your legs.   Bending over.   Only take over-the-counter or prescription medicines as directed by your caregiver. Do not suddenly stop any medicines without asking your caregiver first. SEEK MEDICAL CARE IF:   Your fainting spells continue or happen more frequently in spite of treatment.   You lose consciousness for more than a couple minutes.  You have fainting spells during or after exercising or after being startled.   You have new symptoms that occur with the fainting spells, such as:   Shortness of breath.  Chest pain.   Irregular heartbeat.   You have episodes of twitching or jerky movements that last longer than a few seconds.  You have episodes of twitching or jerky movements without obvious fainting. SEEK IMMEDIATE MEDICAL CARE IF:   You have injuries or bleeding after a fainting spell.   You have episodes of twitching or jerky movements that last longer than 5 minutes.   You have more than one spell of twitching or jerky movements before returning to consciousness after fainting. MAKE SURE YOU:   Understand these instructions.  Will watch your condition.  Will get help right away if you are not doing well or get worse. Document Released: 10/21/2012 Document Reviewed: 10/21/2012 [facility] Patient Information 2015 Portal. This information is not intended to replace advice given to you by your health care provider. Make sure you discuss any questions you have with your health care provider.

## 2014-09-18 NOTE — ED Notes (Signed)
Pt ambulatory to restroom

## 2014-09-18 NOTE — ED Provider Notes (Signed)
CSN: [id number]     Arrival date & time 09/18/14  1629 History   First MD Initiated Contact with Patient 09/18/14 1648     Chief Complaint  Patient presents with  . Weakness      HPI  Patient presents for evaluation after an episode at a restaurant. She just eaten. Knees with steak and soy sauce. She started feeling hot and flushed and nauseated. She thought she was red and flushed her face. She felt lightheaded near syncopal. Family states that she "might of been out for a second or 2. She does not recall a headache. However, family states that she took both of her hands and held her head. Within 2 minutes she seems normal, awake and alert, a symptomatically. Transferred here. No past similar episodes.  Past Medical History  Diagnosis Date  . GERD (gastroesophageal reflux disease)   . Hypertension   . Hypothyroidism   . Anemia   . Hyperlipidemia   . Sinus arrhythmia   . History of migraine headaches     a. Next atypical symptoms in the past. Patient was started on Neurontin for possible neuropathic origin of her pain.  Marland Kitchen COPD (chronic obstructive pulmonary disease)     a. PFTs 2011 show FEV1/FVC of 96%  . Heart murmur, systolic     2-D echo in December 2011 showed a normal EF with grade 1 diastolic dysfunction, trivial pulmonary regurgitation and mildly elevated PA pressure at 37 mmHg probably secondary to her COPD.dynamic obstruction-mid cavity obliteration;  b. 02/2012 Echo: EF 60-65%, mild LVH, PASP 82mmHg.  Marland Kitchen CAD (coronary artery disease)     a. LHC 08/23/11: dLM 40-50%, oRI 40%, oCFX 40%, oD1 70% (small and not amenable to PCI).  LM lesion did not appear to be flow limiting.  Medical rx was recommended;  b. Echo 08/23/11: mild LVH, EF 60-65%, grade 1 diast dysfxn, mild BAE, PASP 24;  c. 02/2012  Cath: LM 50d, LAD 50p, D1 70ost, RI 70p, RCA ok->Med Rx;  d. 01/2013 Cardiolite: EF 88, no ischemia/infarct.  . Carotid stenosis     a. dopplers 10/12:  0-39% bilat ICA;  b. 10/2012 U/S: 0-39%  bilat, f/u 1 yr (10/2013).  . Chronic bronchitis   . Type II diabetes mellitus     a. On oral hypoglycemic agents.  . Blood transfusion 1972    "after daughter born, attempted to give me blood; couldn't give it cause my blood was cold" (09/23/2013)  . History of stomach ulcers 1970's  . Polymyalgia rheumatica   . Diverticulosis of colon (without mention of hemorrhage)   . Benign hypertensive heart and kidney disease with diastolic CHF, NYHA class II and CKD stage III   . Migraines   . Anginal pain   . Shortness of breath on exertion     "just related to angina >1 yr ago" (09/23/2013)  . Arthritis     "back, arms, hips" (09/23/2013)   Past Surgical History  Procedure Laterality Date  . Cataract extraction w/ intraocular lens  implant, bilateral Bilateral 1990's  . Total abdominal hysterectomy  1976   Family History  Problem Relation Age of Onset  . Colon cancer Maternal Grandmother   . COPD Father   . Other Mother     died of unknown causes in her 65's. Pt raised by grandmother.  . Pulmonary Hypertension Daughter    History  Substance Use Topics  . Smoking status: Never Smoker   . Smokeless tobacco: Never Used  . Alcohol  Use: No     Comment: "Last drink 1967"   OB History    No data available     Review of Systems  Constitutional: Negative for fever, chills, diaphoresis, appetite change and fatigue.  HENT: Negative for mouth sores, sore throat and trouble swallowing.   Eyes: Negative for visual disturbance.  Respiratory: Negative for cough, chest tightness, shortness of breath and wheezing.   Cardiovascular: Negative for chest pain.  Gastrointestinal: Positive for nausea. Negative for vomiting, abdominal pain, diarrhea and abdominal distention.  Endocrine: Negative for polydipsia, polyphagia and polyuria.  Genitourinary: Negative for dysuria, frequency and hematuria.  Musculoskeletal: Negative for gait problem.  Skin: Negative for color change, pallor and rash.   Neurological: Positive for dizziness, syncope and light-headedness. Negative for headaches.  Hematological: Does not bruise/bleed easily.  Psychiatric/Behavioral: Negative for behavioral problems and confusion.      Allergies  Aspirin; Codeine; and Penicillins  Home Medications   Prior to Admission medications   Medication Sig Start Date End Date Taking? Authorizing Provider  ADVAIR DISKUS 250-50 MCG/DOSE AEPB inhale 1 dose by mouth every 12 hours 08/25/14  Yes Bartholomew Crews, MD  atorvastatin (LIPITOR) 40 MG tablet Take 1 tablet (40 mg total) by mouth every evening. 03/10/14  Yes Karren Cobble, MD  clopidogrel (PLAVIX) 75 MG tablet Take 1 tablet (75 mg total) by mouth daily. Patient taking differently: Take 75 mg by mouth every evening.  07/28/14  Yes [name], MD  esomeprazole (NEXIUM) 40 MG capsule take 1 capsule by mouth once daily BEFORE BREAKFAST 04/22/14  Yes Inda Castle, MD  glimepiride (AMARYL) 1 MG tablet take 1 tablet by mouth once daily before BREAKFAST 08/23/14  Yes [name], MD  guaiFENesin (MUCINEX) 600 MG 12 hr tablet Take 1 tablet (600 mg total) by mouth 2 (two) times daily. 06/16/14 06/16/15 Yes [name], MD  hydroxypropyl methylcellulose (ISOPTO TEARS) 2.5 % ophthalmic solution Place 1 drop into both eyes 4 (four) times daily as needed for dry eyes.   Yes Historical Provider, MD  imipramine (TOFRANIL) 50 MG tablet take 1 tablet by mouth twice a day 04/21/14  Yes [name], MD  isosorbide mononitrate (IMDUR) 60 MG 24 hr tablet Take 2 tablets (120 mg total) by mouth daily. 05/17/14  Yes [name], MD  levothyroxine (SYNTHROID, LEVOTHROID) 25 MCG tablet take 1 tablet by mouth once daily 06/16/14  Yes [name], MD  loratadine (CLARITIN) 10 MG tablet take 1 tablet by mouth once daily if needed for allergies 06/30/14  Yes Madilyn Fireman, MD  metoprolol succinate (TOPROL-XL) 100 MG 24 hr tablet Take 1 tablet (100 mg total) by mouth daily. Take  with or immediately following a meal. 07/28/14  Yes [name], MD  Multiple Vitamin (MULITIVITAMIN WITH MINERALS) TABS Take 1 tablet by mouth daily.   Yes Historical Provider, MD  pseudoephedrine (SUDAFED) 30 MG tablet Take 30 mg by mouth every 4 (four) hours as needed for congestion.   Yes Historical Provider, MD  sodium chloride (OCEAN) 0.65 % SOLN nasal spray Place 1 spray into both nostrils as needed for congestion. 12/22/13  Yes Dixon Boos, MD  valsartan (DIOVAN) 320 MG tablet Take 1 tablet (320 mg total) by mouth daily. 09/24/13  Yes Rhonda G Barrett, PA-C  acetaminophen (TYLENOL) 500 MG tablet Take 1,000 mg by mouth every 6 (six) hours as needed for headache. For pain    Historical Provider, MD  albuterol (PROVENTIL  HFA;VENTOLIN HFA) 108 (90 BASE) MCG/ACT inhaler Inhale 2 puffs into the lungs every 6 (six) hours as needed for wheezing or shortness of breath. 04/27/14   [name], MD  glucose blood (ONE TOUCH ULTRA TEST) test strip 1 each by Other route as needed for other. Use 1 to 2 times daily to check blood sugar. diag code 250.02. Non-insulin dependent 06/08/14   [name], MD  nitroGLYCERIN (NITROSTAT) 0.4 MG SL tablet Place 1 tablet (0.4 mg total) under the tongue every 5 (five) minutes as needed for chest pain. 04/27/14   [name], MD  [facility] DELICA LANCETS 37C MISC Inject 1 each into the skin as needed. Use 1 to 2 times daily to check blood sugar. diag code 250.02. Non-insulin dependent 06/08/14   [name], MD  promethazine (PHENERGAN) 12.5 MG tablet Take 1 tablet (12.5 mg total) by mouth every 6 (six) hours as needed for nausea or vomiting (Prior to pain medications). 07/27/14   [name], MD  Pseudoephedrine-Acetaminophen (SINUS PAIN/PRESSURE PO) Take by mouth.    Historical Provider, MD   BP 149/61 mmHg  Pulse 60  Temp(Src) 97.8 F (36.6 C) (Oral)  Resp 17  SpO2 97%  LMP 02/11/1975 Physical Exam  Constitutional: She is oriented to person, place,  and time. She appears well-developed and well-nourished. No distress.  HENT:  Head: Normocephalic.  Eyes: Conjunctivae are normal. Pupils are equal, round, and reactive to light. No scleral icterus.  Neck: Normal range of motion. Neck supple. No thyromegaly present.  Cardiovascular: Normal rate and regular rhythm.  Exam reveals no gallop and no friction rub.   No murmur heard. Pulmonary/Chest: Effort normal and breath sounds normal. No respiratory distress. She has no wheezes. She has no rales.  Abdominal: Soft. Bowel sounds are normal. She exhibits no distension. There is no tenderness. There is no rebound.  Musculoskeletal: Normal range of motion.  Neurological: She is alert and oriented to person, place, and time.  Skin: Skin is warm and dry. No rash noted.  Psychiatric: She has a normal mood and affect. Her behavior is normal.    ED Course  Procedures (including critical care time) Labs Review Labs Reviewed  CBC WITH DIFFERENTIAL - Abnormal; Notable for the following:    RBC 3.85 (*)    Hemoglobin 11.7 (*)    HCT 35.8 (*)    All other components within normal limits  BASIC METABOLIC PANEL - Abnormal; Notable for the following:    Glucose, Bld 236 (*)    GFR calc non Af Amer 49 (*)    GFR calc Af Amer 57 (*)    All other components within normal limits  URINALYSIS, ROUTINE W REFLEX MICROSCOPIC - Abnormal; Notable for the following:    Leukocytes, UA SMALL (*)    All other components within normal limits  URINE MICROSCOPIC-ADD ON    Imaging Review No results found.   EKG Interpretation None      MDM   Final diagnoses:  Headache  MSG (monosodium glutamate) intolerance  Vagal reaction    Patient presents asymptomatic. Continues asymptomatic. Reassuring studies here. I think her differential diagnosis includes an MSG reaction versus simple vagal reaction. She is ambulatory here eating and drinking. Plan is discharge home. Return with any recurrence.    Tanna Furry, MD 09/23/14 [phone]

## 2014-09-18 NOTE — ED Notes (Signed)
Bed: SE83 Expected date: 09/18/14 Expected time: 4:24 PM Means of arrival: Ambulance Comments: Dizziness, N/V

## 2014-09-18 NOTE — ED Notes (Signed)
She states that she had just finished eating a meal at a local [employer]; when she suddenly "felt real weak and I had to bend over-and then I felt real hot".  She states these symptoms have now abated and she feels "a lot better".  She is alert and oriented x 4 with clear speech.  She denies any pain or discomfort.  Monitor shows nsr without ectopy.  He skin nis normal, warm and dry and she is breathing normlly. CBG on site was 86.

## 2014-10-07 ENCOUNTER — Other Ambulatory Visit: Payer: Self-pay | Admitting: *Deleted

## 2014-10-07 NOTE — Telephone Encounter (Signed)
DAW1 Pt asking for Brand Name

## 2014-10-08 MED ORDER — VALSARTAN 320 MG PO TABS: 320.0000 mg | ORAL_TABLET | Freq: Every day | ORAL | Status: DC

## 2014-10-10 ENCOUNTER — Other Ambulatory Visit: Payer: Self-pay | Admitting: Physician Assistant

## 2014-10-10 ENCOUNTER — Other Ambulatory Visit: Payer: Self-pay | Admitting: *Deleted

## 2014-10-11 MED ORDER — LORATADINE 10 MG PO TABS: 10.0000 mg | ORAL_TABLET | Freq: Every day | ORAL | Status: DC

## 2014-10-27 ENCOUNTER — Encounter (HOSPITAL_COMMUNITY): Payer: Self-pay | Admitting: Cardiology

## 2014-11-02 ENCOUNTER — Ambulatory Visit: Payer: Medicare Other | Admitting: Internal Medicine

## 2014-11-21 ENCOUNTER — Other Ambulatory Visit: Payer: Self-pay | Admitting: Internal Medicine

## 2014-11-25 ENCOUNTER — Telehealth: Payer: Self-pay | Admitting: Cardiology

## 2014-11-25 NOTE — Telephone Encounter (Signed)
Received request from Nurse fax box, documents faxed for surgical clearance. To: [employer] Fax number: [phone] Attention: 1.8.16/km

## 2014-12-06 ENCOUNTER — Other Ambulatory Visit: Payer: Self-pay | Admitting: Internal Medicine

## 2014-12-06 DIAGNOSIS — M542 Cervicalgia: Secondary | ICD-10-CM | POA: Diagnosis not present

## 2014-12-06 DIAGNOSIS — M79641 Pain in right hand: Secondary | ICD-10-CM | POA: Diagnosis not present

## 2014-12-06 DIAGNOSIS — M79642 Pain in left hand: Secondary | ICD-10-CM | POA: Diagnosis not present

## 2014-12-06 DIAGNOSIS — M7531 Calcific tendinitis of right shoulder: Secondary | ICD-10-CM | POA: Diagnosis not present

## 2014-12-13 ENCOUNTER — Other Ambulatory Visit: Payer: Self-pay | Admitting: Cardiology

## 2014-12-20 ENCOUNTER — Telehealth: Payer: Self-pay | Admitting: Internal Medicine

## 2014-12-20 NOTE — Telephone Encounter (Signed)
Patient confirmed

## 2014-12-20 NOTE — Telephone Encounter (Signed)
Call to patient to confirm appointment for 12/21/14 at 9:15. LMTCB

## 2014-12-21 ENCOUNTER — Encounter: Payer: Self-pay | Admitting: Internal Medicine

## 2014-12-21 ENCOUNTER — Ambulatory Visit (INDEPENDENT_AMBULATORY_CARE_PROVIDER_SITE_OTHER): Payer: Medicare Other | Admitting: Internal Medicine

## 2014-12-21 VITALS — BP 136/60 | HR 78 | Temp 98.0°F | Ht 65.0 in | Wt 128.0 lb

## 2014-12-21 DIAGNOSIS — E038 Other specified hypothyroidism: Secondary | ICD-10-CM | POA: Diagnosis not present

## 2014-12-21 DIAGNOSIS — K219 Gastro-esophageal reflux disease without esophagitis: Secondary | ICD-10-CM

## 2014-12-21 DIAGNOSIS — J984 Other disorders of lung: Secondary | ICD-10-CM

## 2014-12-21 DIAGNOSIS — R234 Changes in skin texture: Secondary | ICD-10-CM

## 2014-12-21 DIAGNOSIS — I2510 Atherosclerotic heart disease of native coronary artery without angina pectoris: Secondary | ICD-10-CM | POA: Diagnosis not present

## 2014-12-21 DIAGNOSIS — E785 Hyperlipidemia, unspecified: Secondary | ICD-10-CM

## 2014-12-21 DIAGNOSIS — I10 Essential (primary) hypertension: Secondary | ICD-10-CM | POA: Diagnosis not present

## 2014-12-21 DIAGNOSIS — E1165 Type 2 diabetes mellitus with hyperglycemia: Secondary | ICD-10-CM

## 2014-12-21 DIAGNOSIS — N183 Chronic kidney disease, stage 3 unspecified: Secondary | ICD-10-CM

## 2014-12-21 DIAGNOSIS — IMO0002 Reserved for concepts with insufficient information to code with codable children: Secondary | ICD-10-CM

## 2014-12-21 DIAGNOSIS — N189 Chronic kidney disease, unspecified: Secondary | ICD-10-CM

## 2014-12-21 DIAGNOSIS — R239 Unspecified skin changes: Secondary | ICD-10-CM

## 2014-12-21 LAB — BASIC METABOLIC PANEL WITH GFR
BUN: 20 mg/dL (ref 6–23)
CHLORIDE: 104 meq/L (ref 96–112)
CO2: 26 mEq/L (ref 19–32)
CREATININE: 1.07 mg/dL (ref 0.50–1.10)
Calcium: 9.4 mg/dL (ref 8.4–10.5)
GFR, Est African American: 57 mL/min — ABNORMAL LOW
GFR, Est Non African American: 50 mL/min — ABNORMAL LOW
Glucose, Bld: 74 mg/dL (ref 70–99)
POTASSIUM: 3.9 meq/L (ref 3.5–5.3)
SODIUM: 138 meq/L (ref 135–145)

## 2014-12-21 LAB — CBC WITH DIFFERENTIAL/PLATELET
BASOS PCT: 0 % (ref 0–1)
Basophils Absolute: 0 10*3/uL (ref 0.0–0.1)
Eosinophils Absolute: 0.2 10*3/uL (ref 0.0–0.7)
Eosinophils Relative: 3 % (ref 0–5)
HEMATOCRIT: 35.6 % — AB (ref 36.0–46.0)
Hemoglobin: 11.8 g/dL — ABNORMAL LOW (ref 12.0–15.0)
LYMPHS ABS: 2.1 10*3/uL (ref 0.7–4.0)
Lymphocytes Relative: 28 % (ref 12–46)
MCH: 29.1 pg (ref 26.0–34.0)
MCHC: 33.1 g/dL (ref 30.0–36.0)
MCV: 87.9 fL (ref 78.0–100.0)
MPV: 9.9 fL (ref 8.6–12.4)
Monocytes Absolute: 0.6 10*3/uL (ref 0.1–1.0)
Monocytes Relative: 8 % (ref 3–12)
NEUTROS PCT: 61 % (ref 43–77)
Neutro Abs: 4.6 10*3/uL (ref 1.7–7.7)
Platelets: 255 10*3/uL (ref 150–400)
RBC: 4.05 MIL/uL (ref 3.87–5.11)
RDW: 14.4 % (ref 11.5–15.5)
WBC: 7.5 10*3/uL (ref 4.0–10.5)

## 2014-12-21 LAB — GLUCOSE, CAPILLARY: Glucose-Capillary: 124 mg/dL — ABNORMAL HIGH (ref 70–99)

## 2014-12-21 LAB — POCT GLYCOSYLATED HEMOGLOBIN (HGB A1C): HEMOGLOBIN A1C: 6.8

## 2014-12-21 NOTE — Progress Notes (Signed)
Subjective:    [name], female    DOB: 01/24/1934, 81 y.o.   MRN: [id number]  CC: Routine check up  HPI  Christine [name] is a 81yo woman with PMH of DM2, chronic renal insufficiency, HTN, CAD, HLD, lumbar spondylosis, GERD, hypothyroidism, cough/RLD of unclear origin.    Christine [name] reports that she is having shoulder surgery in March, she would like a letter sent to her surgeon about her medical history and surgery risks.    Christine [name] has DM and she is on glimepiride.  She brought her BS log in today and her average glucose is 102 with highs in the 120s and lows in the 60s.  She reports having occasional symptoms from the low blood sugar including lightheadedness and she will eat and feel better. She notes that she thinks her BS's have been low b/c she has decreased her PO intake to lose weight.  She did not like that she was gaining weight in her abdomen.  She is resistant to decreasing her glimepiride, so we discussed adequate food intake and healthy options today.    She missed her Dermatology appointment as planned during last visit b/c she had a sinus infection.  Would like to go somewhere closer to her home.  She has an additional concern of a small red spot on nose comes and goes.    She has a history of lung disease, most recent PFTs suggestive restrictive disease, but has not needed the albuterol in a while.  She is taking Advair without issue.   She has a history of CAD, follows with Cardiology.  She has not needed NTG recently.  She is taking plavix, toprol XL, valsartan, IMDUR and atorvastatin.    We reviewed her medications.  She is taking her nexium differently, taking every other day.   She reports taking imipramine for migraine ppx.    Current Outpatient Prescriptions on File Prior to Visit  Medication Sig Dispense Refill  . acetaminophen (TYLENOL) 500 MG tablet Take 1,000 mg by mouth every 6 (six) hours as needed for headache. For pain    . ADVAIR DISKUS 250-50  MCG/DOSE AEPB inhale 1 dose by mouth every 12 hours 60 each 11  . albuterol (PROVENTIL HFA;VENTOLIN HFA) 108 (90 BASE) MCG/ACT inhaler Inhale 2 puffs into the lungs every 6 (six) hours as needed for wheezing or shortness of breath. 1 Inhaler 11  . atorvastatin (LIPITOR) 40 MG tablet Take 1 tablet (40 mg total) by mouth every evening. 90 tablet 3  . clopidogrel (PLAVIX) 75 MG tablet Take 1 tablet (75 mg total) by mouth daily. (Patient taking differently: Take 75 mg by mouth every evening. ) 30 tablet 5  . esomeprazole (NEXIUM) 40 MG capsule take 1 capsule by mouth once daily BEFORE BREAKFAST 30 capsule 11  . glimepiride (AMARYL) 1 MG tablet take 1 tablet by mouth once daily before BREAKFAST 90 tablet 3  . glucose blood (ONE TOUCH ULTRA TEST) test strip 1 each by Other route as needed for other. Use 1 to 2 times daily to check blood sugar. diag code 250.02. Non-insulin dependent 200 each 4  . guaiFENesin (MUCINEX) 600 MG 12 hr tablet Take 1 tablet (600 mg total) by mouth 2 (two) times daily. 28 tablet 0  . hydroxypropyl methylcellulose (ISOPTO TEARS) 2.5 % ophthalmic solution Place 1 drop into both eyes 4 (four) times daily as needed for dry eyes.    Marland Kitchen imipramine (TOFRANIL) 50 MG tablet take 1  tablet by mouth twice a day 60 tablet 6  . isosorbide mononitrate (IMDUR) 60 MG 24 hr tablet take 2 tablets by mouth daily 60 tablet 6  . levothyroxine (SYNTHROID, LEVOTHROID) 25 MCG tablet take 1 tablet by mouth once daily 90 tablet 1  . loratadine (CLARITIN) 10 MG tablet Take 1 tablet (10 mg total) by mouth daily. 30 tablet 6  . metoprolol succinate (TOPROL-XL) 100 MG 24 hr tablet Take 1 tablet (100 mg total) by mouth daily. Take with or immediately following a meal. 90 tablet 4  . Multiple Vitamin (MULITIVITAMIN WITH MINERALS) TABS Take 1 tablet by mouth daily.    . nitroGLYCERIN (NITROSTAT) 0.4 MG SL tablet Place 1 tablet (0.4 mg total) under the tongue every 5 (five) minutes as needed for chest pain. 30  tablet 3  . ONETOUCH DELICA LANCETS 07P MISC Inject 1 each into the skin as needed. Use 1 to 2 times daily to check blood sugar. diag code 250.02. Non-insulin dependent 200 each 4  . Pseudoephedrine-Acetaminophen (SINUS PAIN/PRESSURE PO) Take by mouth.    . sodium chloride (OCEAN) 0.65 % SOLN nasal spray Place 1 spray into both nostrils as needed for congestion. 1 Bottle 0  . valsartan (DIOVAN) 320 MG tablet Take 1 tablet (320 mg total) by mouth daily. 30 tablet 11  . promethazine (PHENERGAN) 12.5 MG tablet Take 1 tablet (12.5 mg total) by mouth every 6 (six) hours as needed for nausea or vomiting (Prior to pain medications). (Patient not taking: Reported on 12/21/2014) 45 tablet 3  . pseudoephedrine (SUDAFED) 30 MG tablet Take 30 mg by mouth every 4 (four) hours as needed for congestion.     No current facility-administered medications on file prior to visit.     Review of Systems  Constitutional: Negative for fever, chills and unexpected weight change.  HENT: Negative for ear discharge, ear pain, nosebleeds and postnasal drip.   Eyes: Negative for photophobia and redness.  Respiratory: Negative for cough, choking, chest tightness and shortness of breath.   Cardiovascular: Negative for chest pain.  Gastrointestinal: Negative for nausea, abdominal pain, diarrhea and blood in stool.  Endocrine: Positive for polydipsia (drinks a lot of water). Negative for polyuria.  Genitourinary: Negative for dysuria, enuresis and difficulty urinating.  Musculoskeletal: Positive for back pain and arthralgias (arms and the shoulders).  Skin: Positive for rash. Negative for wound.  Neurological: Negative for dizziness, weakness, light-headedness and headaches.  Psychiatric/Behavioral: Negative for dysphoric mood and decreased concentration.       Objective:   Physical Exam  Constitutional: She is oriented to person, place, and time. She appears well-developed and well-nourished. No distress.  HENT:  Head:  Normocephalic and atraumatic.  Mouth/Throat: No oropharyngeal exudate.  Hearing aids in place  Eyes: Conjunctivae are normal. Pupils are equal, round, and reactive to light. Right eye exhibits no discharge. Left eye exhibits no discharge. No scleral icterus.  Neck: Normal range of motion. Neck supple. No thyromegaly present.  No carotid bruit noted  Cardiovascular: Normal rate, regular rhythm and intact distal pulses.   Murmur (systolic) heard. Pulmonary/Chest: Effort normal and breath sounds normal. No respiratory distress. She has no wheezes. She exhibits no tenderness.  Abdominal: Soft. Bowel sounds are normal. She exhibits no distension and no mass. There is no tenderness.  Musculoskeletal: She exhibits no edema or tenderness.  Lymphadenopathy:    She has no cervical adenopathy.  Neurological: She is alert and oriented to person, place, and time. She exhibits normal muscle tone.  Skin: Skin is warm and dry. Rash (macular skin rash on cheecks, unchanged.  + erythematous nodule on tip of nose) noted. No erythema.  Psychiatric: She has a normal mood and affect. Her behavior is normal.   BMET, MAU/Cr and CBC today     Assessment & Plan:  RTC in 6 months, sooner if needed

## 2014-12-21 NOTE — Patient Instructions (Signed)
General Instructions: Please schedule a follow up visit within the next 3-6 months.   For your medications:   Please bring all of your pill  Bottles with you to each visit.  This will help make sure that we have an up to date list of all the medications you are taking.  Please also bring any over the counter herbal medications you are taking (not including advil, tylenol, etc.)  Please continue taking your medications as prescribed.  Please start taking nexium every other day.  We may decrease the dose of this medication at your next visit as well.   A new referral for Dermatology will be made for you.    Please continue to eat 3 meals a day and snacks in between to avoid low blood sugars.   Thank you!   Treatment Goals:  Goals (1 Years of Data) as of 12/21/14    None      Progress Toward Treatment Goals:  Treatment Goal 12/21/2014  Hemoglobin A1C at goal  Blood pressure at goal    Self Care Goals & Plans:  Self Care Goal 12/21/2014  Manage my medications take my medicines as prescribed; bring my medications to every visit; refill my medications on time  Monitor my health keep track of my blood glucose; bring my glucose meter and log to each visit; check my feet daily  Eat healthy foods eat more vegetables; eat foods that are low in salt; eat baked foods instead of fried foods  Be physically active find an activity I enjoy    Home Blood Glucose Monitoring 04/27/2014  Check my blood sugar 2 times a day  When to check my blood sugar before breakfast; before lunch     Care Management & Community Referrals:  Referral 12/21/2014  Referrals made for care management support none needed     Low Blood Sugar Low blood sugar (hypoglycemia) means that the level of sugar in your blood is lower than it should be. Signs of low blood sugar include:  Getting sweaty.  Feeling hungry.  Feeling dizzy or weak.  Feeling sleepier than normal.  Feeling nervous.  Headaches.  Having a  fast heartbeat. Low blood sugar can happen fast and can be an emergency. Your doctor can do tests to check your blood sugar level. You can have low blood sugar and not have diabetes. HOME CARE  Check your blood sugar as told by your doctor. If it is less than 70 mg/dl or as told by your doctor, take 1 of the following:  3 to 4 glucose tablets.   cup clear juice.   cup soda pop, not diet.  1 cup milk.  5 to 6 hard candies.  Recheck blood sugar after 15 minutes. Repeat until it is at the right level.  Eat a snack if it is more than 1 hour until the next meal.  Only take medicine as told by your doctor.  Do not skip meals. Eat on time.  Do not drink alcohol except with meals.  Check your blood glucose before driving.  Check your blood glucose before and after exercise.  Always carry treatment with you, such as glucose pills.  Always wear a medical alert bracelet if you have diabetes. GET HELP RIGHT AWAY IF:   Your blood glucose goes below 70 mg/dl or as told by your doctor, and you:  Are confused.  Are not able to swallow.  Pass out (faint).  You cannot treat yourself. You may need someone to  help you.  You have low blood sugar problems often.  You have problems from your medicines.  You are not feeling better after 3 to 4 days.  You have vision changes. MAKE SURE YOU:   Understand these instructions.  Will watch this condition.  Will get help right away if you are not doing well or get worse. Document Released: 01/29/2010 Document Revised: 01/27/2012 Document Reviewed: 01/29/2010 [facility] Patient Information 2015 [place], [place]. This information is not intended to replace advice given to you by your health care provider. Make sure you discuss any questions you have with your health care provider.

## 2014-12-21 NOTE — Assessment & Plan Note (Signed)
Per PFTs.  She also has a recurrent cough that is better with nexium.    She is at risk for post operative pulmonary complications including pneumonia, atelectasis or prolonged ventilation.  Her risk factors include chronic lung disease and Age >60 years.  Most importantly, however, her surgery is not situated close to the diaphragm.  Her risk would increase if the surgery were planned to take more than 3 hours.    She is also at risk for surgical complications given her CRI and DM 2 (well controlled).  These will need to be monitored closely post operatively.    There are no defined criteria, but she is likely at low to moderate risk for post op pulmonary complications given her lung disease, age and comorbidities.

## 2014-12-21 NOTE — Assessment & Plan Note (Signed)
She is on atorvastatin, last LDL was 67. Continue

## 2014-12-21 NOTE — Assessment & Plan Note (Signed)
Continue synthroid 86mcg. She has no concerning symptoms today. TSH last normal about a year ago.  Check at next visit.

## 2014-12-21 NOTE — Assessment & Plan Note (Signed)
Lab Results  Component Value Date   HGBA1C 6.8 12/21/2014   HGBA1C 7.1 07/27/2014   HGBA1C 7.4 04/27/2014     Assessment: Diabetes control: good control (HgbA1C at goal) Progress toward A1C goal:  at goal Comments: She is now < 7 and with lows.    Plan: Medications:  continue current medications, glimepiride Home glucose monitoring: Frequency:   Timing:   Instruction/counseling given: reminded to get eye exam, reminded to bring medications to each visit and discussed diet Educational resources provided:   Self management tools provided: copy of home glucose meter download Other plans:   BP well controlled LDL 67 Foot today MAU/Cr today BS are actually starting to get too low with glimepiride, however, she is resistant to decreasing the dose or discontinuing.  I advised her to start eating more regularly including 3 meals a day and snacks.

## 2014-12-21 NOTE — Assessment & Plan Note (Signed)
Have placed another Dermatology referral at patient request.

## 2014-12-21 NOTE — Assessment & Plan Note (Addendum)
Follows with Cardiology.  Defer to that group to assess the cardiac risk of surgery.  Continue current medications including plavix, beta blocker, valsartan, IMDUR and statin.

## 2014-12-21 NOTE — Assessment & Plan Note (Signed)
BP Readings from Last 3 Encounters:  12/21/14 136/60  09/18/14 149/61  08/05/14 146/62    Lab Results  Component Value Date   NA 141 09/18/2014   K 4.6 09/18/2014   CREATININE 1.06 09/18/2014    Assessment: Blood pressure control: controlled Progress toward BP goal:  at goal Comments: doing well, BMET today  Plan: Medications:  continue current medications, Toprol XL, IMDUR, valsartan Educational resources provided:   Self management tools provided:   Other plans:

## 2014-12-21 NOTE — Assessment & Plan Note (Signed)
Last Cr check was 1.06, repeat BMET today in anticipation of surgery.

## 2014-12-21 NOTE — Assessment & Plan Note (Signed)
Continue nexium, change to QOD per patient preference.

## 2014-12-22 LAB — MICROALBUMIN / CREATININE URINE RATIO
Creatinine, Urine: 52.4 mg/dL
MICROALB UR: 0.4 mg/dL (ref <2.0)
MICROALB/CREAT RATIO: 7.6 mg/g (ref 0.0–30.0)

## 2014-12-26 ENCOUNTER — Telehealth: Payer: Self-pay | Admitting: Internal Medicine

## 2014-12-26 NOTE — Telephone Encounter (Signed)
Received call from Christine [name] to discuss her blood work.   Attempted call back X 2 and phone number was busy.   Gilles Chiquito, MD

## 2015-01-11 ENCOUNTER — Other Ambulatory Visit: Payer: Self-pay | Admitting: Internal Medicine

## 2015-01-11 DIAGNOSIS — Z1231 Encounter for screening mammogram for malignant neoplasm of breast: Secondary | ICD-10-CM

## 2015-01-17 ENCOUNTER — Ambulatory Visit (HOSPITAL_COMMUNITY)
Admission: RE | Admit: 2015-01-17 | Discharge: 2015-01-17 | Disposition: A | Discharge status: Home / Self Care | Payer: Medicare Other | Source: Ambulatory Visit | Attending: Internal Medicine | Admitting: Internal Medicine

## 2015-01-17 DIAGNOSIS — Z1231 Encounter for screening mammogram for malignant neoplasm of breast: Secondary | ICD-10-CM | POA: Insufficient documentation

## 2015-01-17 DIAGNOSIS — H524 Presbyopia: Secondary | ICD-10-CM | POA: Diagnosis not present

## 2015-01-17 DIAGNOSIS — H26491 Other secondary cataract, right eye: Secondary | ICD-10-CM | POA: Diagnosis not present

## 2015-01-17 DIAGNOSIS — H04123 Dry eye syndrome of bilateral lacrimal glands: Secondary | ICD-10-CM | POA: Diagnosis not present

## 2015-01-17 LAB — HM DIABETES EYE EXAM

## 2015-02-02 ENCOUNTER — Ambulatory Visit (INDEPENDENT_AMBULATORY_CARE_PROVIDER_SITE_OTHER): Payer: Medicare Other | Admitting: Cardiology

## 2015-02-02 ENCOUNTER — Encounter: Payer: Self-pay | Admitting: Cardiology

## 2015-02-02 VITALS — BP 128/56 | HR 70 | Ht 65.0 in | Wt 128.0 lb

## 2015-02-02 DIAGNOSIS — E785 Hyperlipidemia, unspecified: Secondary | ICD-10-CM

## 2015-02-02 DIAGNOSIS — I2510 Atherosclerotic heart disease of native coronary artery without angina pectoris: Secondary | ICD-10-CM | POA: Diagnosis not present

## 2015-02-02 NOTE — Patient Instructions (Signed)
Your physician wants you to follow-up in: 1 year with Dr Aundra Dubin. (March 2017).  You will receive a reminder letter in the mail two months in advance. If you don't receive a letter, please call our office to schedule the follow-up appointment.

## 2015-02-02 NOTE — Progress Notes (Signed)
[name], female   DOB: 01/24/1934, 81 y.o.   MRN: [id number] PCP: Dr. Daryll Drown  81 yo with history of CAD, HTN, and diabetes presents for cardiology followup.  I initially saw Christine [name] in the hospital in 10/12 with chest pain.  Left heart cath showed a 40-50% distal left main stenosis that was not thought to be flow-limiting.  She additionally had a 75% stenosis in a diagonal.  Chest pain resolved with medical therapy.  She was admitted again in 4/13 with chest pain and exertional dyspnea.  Cardiac enzymes were negative.  LHC looked similar to prior with 50% distal LM, 70% ostial D1, and 70% ostial ramus.  Echo showed preserved EF.  A dobutamine stress echo was done, this was submaximal but did not show evidence for ischemia.  No intervention, planned medical management.   Anti-anginal meds were titrated up and she was discharged home.  She developed chest pain (atypical) again in 2014.  Lexiscan Cardiolite was normal in 3/14.    Lately, she has been doing quite well.  No dyspnea walking on flat ground, mildly short of breath with stairs.  No chest pain.  No palpitations.  Not very active.  She had been having right shoulder pain and was found to have rotator cuff problems potentially needing surgery.  However, this has improved and she says that she now has minimal problems moving her right arm at the shoulder and minimal pain.    ECG: NSR with PACs, rSR' V1, Qs V1/V2  Labs (2/13): K 3.3, creatinine 0.9 Labs (3/13): HDL 42, LDL 49 Labs (4/13): K 4.1, creatinine 1.01 Labs (10/13): LDL 56, HDL 38 Labs (2/14): K 3.8, creatinine 0.98 Labs (7/14): K 4.4, creatinine 1.17 Labs (12/14): LDL 75, HDL 51 Labs (9/15): K 3.7, creatinine 1.09, LDL 67, HDL 35 Labs (2/16): K 3.9, creatinine 1.07  PMH: 1. CAD: Chest pain 10/12.  LHC with 40-50% distal LM stenosis, 70-75% ostial D1.  No flow-limiting lesions, medically managed.  Echo (10/12): EF 60-65%, mild LV hypertrophy, PA systolic pressure 24  mmHg.  Chest pain 4/13.  LHC with 50% distal LM, 70% ostial D1, 50% proximal LAD, 70% proximal ramus.  No flow-limiting lesions, medically managed.  Echo (4/13) with EF 60-65%, mild LVH.  DSE (4/13) with no ischemia but it was a submaximal study.  Lexiscan Cardiolite (3/14) with EF 88%, no ischemia or infarction.  2. DM2 3. HTN 4. Hyperlipidemia 5. CKD 6. Carotid dopplers (10/12): 0-39% bilateral carotid stenosis.  7. Allergy to aspirin => rash.  8. GERD 9. Migraines 10. Hypothyroidism 11. COPD 65. PMR: Dr. Charlestine Night.  13. ABIs (4/15): Normal 14. Sciatica  SH: Nonsmoker.  Lives in [place].   FH: CAD  Current Outpatient Prescriptions  Medication Sig Dispense Refill  . acetaminophen (TYLENOL) 500 MG tablet Take 1,000 mg by mouth every 6 (six) hours as needed for headache. For pain    . ADVAIR DISKUS 250-50 MCG/DOSE AEPB inhale 1 dose by mouth every 12 hours 60 each 11  . albuterol (PROVENTIL HFA;VENTOLIN HFA) 108 (90 BASE) MCG/ACT inhaler Inhale 2 puffs into the lungs every 6 (six) hours as needed for wheezing or shortness of breath. 1 Inhaler 11  . atorvastatin (LIPITOR) 40 MG tablet Take 1 tablet (40 mg total) by mouth every evening. 90 tablet 3  . clopidogrel (PLAVIX) 75 MG tablet Take 1 tablet (75 mg total) by mouth daily. (Patient taking differently: Take 75 mg by mouth every evening. ) 30 tablet  5  . esomeprazole (NEXIUM) 40 MG capsule take 1 capsule by mouth once daily BEFORE BREAKFAST 30 capsule 11  . glimepiride (AMARYL) 1 MG tablet take 1 tablet by mouth once daily before BREAKFAST 90 tablet 3  . glucose blood (ONE TOUCH ULTRA TEST) test strip 1 each by Other route as needed for other. Use 1 to 2 times daily to check blood sugar. diag code 250.02. Non-insulin dependent 200 each 4  . guaiFENesin (MUCINEX) 600 MG 12 hr tablet Take 1 tablet (600 mg total) by mouth 2 (two) times daily. 28 tablet 0  . hydroxypropyl methylcellulose (ISOPTO TEARS) 2.5 % ophthalmic solution Place 1  drop into both eyes 4 (four) times daily as needed for dry eyes.    Marland Kitchen imipramine (TOFRANIL) 50 MG tablet take 1 tablet by mouth twice a day 60 tablet 6  . isosorbide mononitrate (IMDUR) 60 MG 24 hr tablet take 2 tablets by mouth daily 60 tablet 6  . levothyroxine (SYNTHROID, LEVOTHROID) 25 MCG tablet take 1 tablet by mouth once daily 90 tablet 1  . loratadine (CLARITIN) 10 MG tablet Take 1 tablet (10 mg total) by mouth daily. 30 tablet 6  . metoprolol succinate (TOPROL-XL) 100 MG 24 hr tablet Take 1 tablet (100 mg total) by mouth daily. Take with or immediately following a meal. 90 tablet 4  . Multiple Vitamin (MULITIVITAMIN WITH MINERALS) TABS Take 1 tablet by mouth daily.    . nitroGLYCERIN (NITROSTAT) 0.4 MG SL tablet Place 1 tablet (0.4 mg total) under the tongue every 5 (five) minutes as needed for chest pain. 30 tablet 3  . ONETOUCH DELICA LANCETS 06C MISC Inject 1 each into the skin as needed. Use 1 to 2 times daily to check blood sugar. diag code 250.02. Non-insulin dependent 200 each 4  . promethazine (PHENERGAN) 12.5 MG tablet Take 1 tablet (12.5 mg total) by mouth every 6 (six) hours as needed for nausea or vomiting (Prior to pain medications). 45 tablet 3  . pseudoephedrine (SUDAFED) 30 MG tablet Take 30 mg by mouth every 4 (four) hours as needed for congestion.    . Pseudoephedrine-Acetaminophen (SINUS PAIN/PRESSURE PO) Take by mouth.    . sodium chloride (OCEAN) 0.65 % SOLN nasal spray Place 1 spray into both nostrils as needed for congestion. 1 Bottle 0  . valsartan (DIOVAN) 320 MG tablet Take 1 tablet (320 mg total) by mouth daily. 30 tablet 11   No current facility-administered medications for this visit.   BP 128/56 mmHg  Pulse 70  Ht 5\' 5"  (1.651 m)  Wt 128 lb (58.06 kg)  BMI 21.30 kg/m2  LMP 02/11/1975 General: NAD Neck: No JVD, no thyromegaly or thyroid nodule.  Lungs: Clear to auscultation bilaterally with normal respiratory effort. CV: Nondisplaced PMI.  Heart regular  S1/S2, no S3/S4, 1/6 early SEM.  No peripheral edema.  No carotid bruit.  1+ PT bilaterally.   Abdomen: Soft, nontender, no hepatosplenomegaly, no distention.  Neurologic: Alert and oriented x 3.  Psych: Normal affect. Extremities: No clubbing or cyanosis.   Assessment/Plan: 1. CAD: Non-flow limiting, moderate disease on cath in 4/13. No ischemia or infarction on Lexiscan Cardiolite in 3/14.  No recent chest pain.  - Continue Plavix (allergic to aspirin), Toprol XL, valsartan, Imdur. - She is on atorvastatin.   2. Hyperlipidemia: Good lipids in 9/15.    Loralie Champagne 02/02/2015 9:44 PM

## 2015-02-03 NOTE — Addendum Note (Signed)
Addended by: Katrine Coho on: 02/03/2015 07:48 AM   Modules accepted: Orders

## 2015-02-08 DIAGNOSIS — G5601 Carpal tunnel syndrome, right upper limb: Secondary | ICD-10-CM | POA: Diagnosis not present

## 2015-02-20 ENCOUNTER — Other Ambulatory Visit: Payer: Self-pay | Admitting: Internal Medicine

## 2015-02-27 ENCOUNTER — Telehealth: Payer: Self-pay | Admitting: *Deleted

## 2015-02-27 NOTE — Telephone Encounter (Signed)
Pt called - off work 03/01/15 - past week having problems with sinus. Pt aware [facility] will call and leave message with daughter for 03/01/15 appt. No open appt at this time for the week. Hilda Blades Ifeanyichukwu Wickham RN 02/27/15 3:30PM

## 2015-03-01 ENCOUNTER — Telehealth: Payer: Self-pay | Admitting: *Deleted

## 2015-03-01 DIAGNOSIS — E785 Hyperlipidemia, unspecified: Secondary | ICD-10-CM

## 2015-03-01 MED ORDER — ATORVASTATIN CALCIUM 40 MG PO TABS: 40.0000 mg | ORAL_TABLET | Freq: Every evening | ORAL | Status: DC

## 2015-03-01 NOTE — Telephone Encounter (Signed)
Done

## 2015-03-01 NOTE — Telephone Encounter (Signed)
[facility] called for refill on atorvastatin 40mg  #90. Hilda Blades Ceclia Koker RN 03/01/15 9:50AM

## 2015-03-03 ENCOUNTER — Ambulatory Visit: Payer: Medicare Other | Admitting: Internal Medicine

## 2015-04-04 ENCOUNTER — Telehealth: Payer: Self-pay | Admitting: Internal Medicine

## 2015-04-04 NOTE — Telephone Encounter (Signed)
Call to patient to confirm appointment for 04/05/15 at 9;45 lmtcb ° °

## 2015-04-05 ENCOUNTER — Ambulatory Visit (INDEPENDENT_AMBULATORY_CARE_PROVIDER_SITE_OTHER): Payer: Medicare Other | Admitting: Internal Medicine

## 2015-04-05 ENCOUNTER — Encounter: Payer: Self-pay | Admitting: Internal Medicine

## 2015-04-05 VITALS — BP 139/52 | HR 66 | Temp 97.8°F | Ht 65.0 in | Wt 129.2 lb

## 2015-04-05 DIAGNOSIS — R010 Benign and innocent cardiac murmurs: Secondary | ICD-10-CM

## 2015-04-05 DIAGNOSIS — R239 Unspecified skin changes: Secondary | ICD-10-CM

## 2015-04-05 DIAGNOSIS — R011 Cardiac murmur, unspecified: Secondary | ICD-10-CM

## 2015-04-05 DIAGNOSIS — E119 Type 2 diabetes mellitus without complications: Secondary | ICD-10-CM | POA: Diagnosis not present

## 2015-04-05 DIAGNOSIS — R234 Changes in skin texture: Secondary | ICD-10-CM

## 2015-04-05 DIAGNOSIS — IMO0002 Reserved for concepts with insufficient information to code with codable children: Secondary | ICD-10-CM

## 2015-04-05 DIAGNOSIS — G43909 Migraine, unspecified, not intractable, without status migrainosus: Secondary | ICD-10-CM

## 2015-04-05 DIAGNOSIS — G43809 Other migraine, not intractable, without status migrainosus: Secondary | ICD-10-CM

## 2015-04-05 DIAGNOSIS — I10 Essential (primary) hypertension: Secondary | ICD-10-CM

## 2015-04-05 DIAGNOSIS — E1165 Type 2 diabetes mellitus with hyperglycemia: Secondary | ICD-10-CM

## 2015-04-05 LAB — POCT GLYCOSYLATED HEMOGLOBIN (HGB A1C): HEMOGLOBIN A1C: 6.5

## 2015-04-05 LAB — GLUCOSE, CAPILLARY: Glucose-Capillary: 107 mg/dL — ABNORMAL HIGH (ref 65–99)

## 2015-04-05 MED ORDER — HYDROCORTISONE 1 % EX CREA: TOPICAL_CREAM | CUTANEOUS | Status: DC

## 2015-04-05 NOTE — Assessment & Plan Note (Signed)
She has two issues going on - -   For her skin changes on the face - re refer to Dermatology.    For her skin changes on the chest - trial of hydrocortisone cream BID for 7 days.   Her symptoms sound like eczema, but timing and location are not necessarily typical.  Could also be related to dry skin.  She will let me know if she is not improved.

## 2015-04-05 NOTE — Assessment & Plan Note (Signed)
She is on imipramine chronically.  No headache reported today.

## 2015-04-05 NOTE — Assessment & Plan Note (Signed)
Not noted on exam today. TTE from 2013 reviewed with no significant findings except grade 1 diastolic CHF.  Monitor at each visit.

## 2015-04-05 NOTE — Progress Notes (Signed)
Subjective:    [name], female    DOB: 01/24/1934, 81 y.o.   MRN: [id number]  CC: Routine follow up of HTN, DM2  HPI  Christine [name] is a 81yo woman with PMH of DM2, CKD stage 3, CAD (follows with Cardiology), HLD, HTN, GERD, hypothyroidism, migraine who presents for routine follow up.   Christine [name] has previously seen me for skin changes on her face, these include darkened patches on her face, cheeks in a malar distribution and a lip nodule that comes and goes.  The nodule is not there today, but the patches on her cheeks are unchanged by my exam.  She was due to see dermatology at her request, however, she missed the appointment as she was not called.  She notes that her cell phone does not always work in her house, so requests that her daughter Christine [name] be called (phone number in demographics).    Christine [name] reports another issue today which is an itchy patch on her right chest.  She reports that about 3 years ago she had a bug bite on her LEFT neck and developed a very similar patch on her LEFT chest.  This area has come and gone, but is now her right.  She notes that it starts itching first, then she scratches, and then it gets darkened and worsened.  There is no erythema, no pain, no bleeding.  It is mainly just itchy.  She has tried some diaper cream on it which has helped.  There are no scales or shiny patches.    Christine [name] was due to have shoulder surgery on the right, however, prior the surgery her arm got much better and it barely bothers her anymore, so she cancelled the surgery.      Otherwise doing well.  For her DM, she has had no further hypoglycemic events and is eating better per her.  She brought her BS log which showed lows in the high 60s and highs around 200, but the majority of her BS being in goal.  Her A1C today is improved.  She also has HTN for which she takes valsartan and metoprolol and this is well controlled today.  She has CAD for which she sees  Cardiology.  No use of nitroglycerin recently.  Overall, except for her rashes, she is doing well.    We reviewed her medications.     Medication List       This list is accurate as of: 04/05/15 10:47 AM.  Always use your most recent med list.               acetaminophen 500 MG tablet  Commonly known as:  TYLENOL  Take 1,000 mg by mouth every 6 (six) hours as needed for headache. For pain     ADVAIR DISKUS 250-50 MCG/DOSE Aepb  Generic drug:  Fluticasone-Salmeterol  inhale 1 dose by mouth every 12 hours     albuterol 108 (90 BASE) MCG/ACT inhaler  Commonly known as:  PROVENTIL HFA;VENTOLIN HFA  Inhale 2 puffs into the lungs every 6 (six) hours as needed for wheezing or shortness of breath.     atorvastatin 40 MG tablet  Commonly known as:  LIPITOR  Take 1 tablet (40 mg total) by mouth every evening.     clopidogrel 75 MG tablet  Commonly known as:  PLAVIX  take 1 tablet by mouth once daily     esomeprazole 40 MG capsule  Commonly known as:  NEXIUM  take 1 capsule by mouth once daily BEFORE BREAKFAST     glimepiride 1 MG tablet  Commonly known as:  AMARYL  take 1 tablet by mouth once daily before BREAKFAST     glucose blood test strip  Commonly known as:  ONE TOUCH ULTRA TEST  1 each by Other route as needed for other. Use 1 to 2 times daily to check blood sugar. diag code 250.02. Non-insulin dependent     guaiFENesin 600 MG 12 hr tablet  Commonly known as:  MUCINEX  Take 1 tablet (600 mg total) by mouth 2 (two) times daily.     hydroxypropyl methylcellulose / hypromellose 2.5 % ophthalmic solution  Commonly known as:  ISOPTO TEARS / GONIOVISC  Place 1 drop into both eyes 4 (four) times daily as needed for dry eyes.     imipramine 50 MG tablet  Commonly known as:  TOFRANIL  take 1 tablet by mouth twice a day     isosorbide mononitrate 60 MG 24 hr tablet  Commonly known as:  IMDUR  take 2 tablets by mouth daily     levothyroxine 25 MCG tablet  Commonly  known as:  SYNTHROID, LEVOTHROID  take 1 tablet by mouth once daily     loratadine 10 MG tablet  Commonly known as:  CLARITIN  Take 1 tablet (10 mg total) by mouth daily.     metoprolol succinate 100 MG 24 hr tablet  Commonly known as:  TOPROL-XL  Take 1 tablet (100 mg total) by mouth daily. Take with or immediately following a meal.     multivitamin with minerals Tabs tablet  Take 1 tablet by mouth daily.     nitroGLYCERIN 0.4 MG SL tablet  Commonly known as:  NITROSTAT  Place 1 tablet (0.4 mg total) under the tongue every 5 (five) minutes as needed for chest pain.     ONETOUCH DELICA LANCETS 44R Misc  Inject 1 each into the skin as needed. Use 1 to 2 times daily to check blood sugar. diag code 250.02. Non-insulin dependent     promethazine 12.5 MG tablet  Commonly known as:  PHENERGAN  Take 1 tablet (12.5 mg total) by mouth every 6 (six) hours as needed for nausea or vomiting (Prior to pain medications).     pseudoephedrine 30 MG tablet  Commonly known as:  SUDAFED  Take 30 mg by mouth every 4 (four) hours as needed for congestion.     SINUS PAIN/PRESSURE PO  Take by mouth.     sodium chloride 0.65 % Soln nasal spray  Commonly known as:  OCEAN  Place 1 spray into both nostrils as needed for congestion.     valsartan 320 MG tablet  Commonly known as:  DIOVAN  Take 1 tablet (320 mg total) by mouth daily.         Review of Systems  Constitutional: Negative for fever and chills.  HENT: Negative for ear discharge and ear pain.   Eyes: Negative for photophobia and visual disturbance.  Respiratory: Positive for cough (mostly allergies during the change of season). Negative for choking and shortness of breath.   Cardiovascular: Negative for chest pain and leg swelling.  Gastrointestinal: Positive for constipation (occasional only). Negative for diarrhea and abdominal distention.  Genitourinary: Negative for dysuria, enuresis and difficulty urinating.  Musculoskeletal:  Positive for arthralgias (mild ). Negative for back pain and gait problem.  Skin: Positive for color change and rash.  Neurological: Positive for dizziness (for a  while, but much better today). Negative for tremors, weakness and numbness.  Psychiatric/Behavioral: Negative for dysphoric mood and decreased concentration.       Objective:   Physical Exam  Constitutional: She is oriented to person, place, and time. She appears well-developed and well-nourished. No distress.  HENT:  Head: Normocephalic and atraumatic.  Eyes: Conjunctivae are normal. No scleral icterus.  Cardiovascular: Normal rate, regular rhythm and normal heart sounds.   No murmur noted, history noted in chart of systolic murmur  Pulmonary/Chest: Effort normal and breath sounds normal. No respiratory distress. She has no wheezes.  Abdominal: Soft. Bowel sounds are normal. She exhibits no distension. There is no tenderness.  Musculoskeletal: She exhibits no edema.  Neurological: She is alert and oriented to person, place, and time.  Skin: Skin is warm. Rash noted. No erythema.  + dark flat plagues on bilateral cheeks.  Lip lesion seen at previous visit resolved.  + rough patch to right anterior chest, appears to be related to scratching.    Psychiatric: She has a normal mood and affect. Her behavior is normal.    No labs today      Assessment & Plan:  RTC in 4 months for follow up, sooner if needed.

## 2015-04-05 NOTE — Patient Instructions (Signed)
General Instructions: Please schedule a follow up visit within the next 4 months.   For your medications:   Please bring all of your pill  Bottles with you to each visit.  This will help make sure that we have an up to date list of all the medications you are taking.  Please also bring any over the counter herbal medications you are taking (not including advil, tylenol, etc.)  Please continue taking all of your medications as prescribed.   You will have a new steroid cream for the itchy patch on your chest.  Do not use this cream on your hands or face as it will cause skin thinning.    We will attempt to reschedule your Dermatology appointment for the skin changes on your cheeks and lips.    Thank you!   Treatment Goals:  Goals (1 Years of Data) as of 04/05/15    None      Progress Toward Treatment Goals:  Treatment Goal 04/05/2015  Hemoglobin A1C at goal  Blood pressure at goal    Self Care Goals & Plans:  Self Care Goal 12/21/2014  Manage my medications take my medicines as prescribed; bring my medications to every visit; refill my medications on time  Monitor my health keep track of my blood glucose; bring my glucose meter and log to each visit; check my feet daily  Eat healthy foods eat more vegetables; eat foods that are low in salt; eat baked foods instead of fried foods  Be physically active find an activity I enjoy    Home Blood Glucose Monitoring 04/27/2014  Check my blood sugar 2 times a day  When to check my blood sugar before breakfast; before lunch     Care Management & Community Referrals:  Referral 12/21/2014  Referrals made for care management support none needed    Hydrocortisone skin cream, ointment, lotion, or solution What is this medicine? HYDROCORTISONE (hye droe KOR ti sone) is a corticosteroid. It is used on the skin to reduce swelling, redness, itching, and allergic reactions. This medicine may be used for other purposes; ask your health care  provider or pharmacist if you have questions. COMMON BRAND NAME(S): Ala-Cort, Ala-Scalp, Aqua Glycolic HC, Balneol for Her, Caldecort, Cetacort, Cortaid, Cortaid Advanced, Cortaid Intensive Therapy, Cortaid Sensitive Skin, CortAlo, Corticaine, Corticool, Cortizone, Cortizone-10, Cortizone-10 Cooling Relief, Cortizone-10 Intensive Healing, Cortizone-10 Plus, Dermarest Dricort, Dermarest Eczema, DERMASORB HC Complete, Gly-Cort, Hycort, Hydroskin, Hytone, Instacort, Lacticare HC, Locoid, Locoid Lipocream, Neosporin Eczema, NuCort, Nutracort, NuZon, Pandel, Pediaderm HC, Penecort, Preparation H Hydrocortisone, Rederm, Sarnol-HC, Scalacort, Scalpicin Anti-Itch, Texacort, Tucks HC, Westcort What should I tell my health care provider before I take this medicine? They need to know if you have any of these conditions: -any active infection -diabetes -large areas of burned or damaged skin -skin wasting or thinning -an unusual or allergic reaction to hydrocortisone, corticosteroids, sulfites, other medicines, foods, dyes, or preservatives -pregnant or trying to get pregnant -breast-feeding How should I use this medicine? This medicine is for external use only. Do not take by mouth. Follow the directions on the prescription label. Wash your hands before and after use. Apply a thin film of medicine to the affected area. Do not cover with a bandage or dressing unless your doctor or health care professional tells you to. Do not use on healthy skin or over large areas of skin. Do not get this medicine in your eyes. If you do, rinse out with plenty of cool tap water. Do not to  use more medicine than prescribed. Do not use your medicine more often than directed or for more than 14 days. Talk to your pediatrician regarding the use of this medicine in children. Special care may be needed. While this drug may be prescribed for children as young as 61 years of age for selected conditions, precautions do apply. Do not use this  medicine for the treatment of diaper rash unless directed to do so by your doctor or health care professional. If applying this medicine to the diaper area of a child, do not cover with tight-fitting diapers or plastic pants. This may increase the amount of medicine that passes through the skin and increase the risk of serious side effects. Elderly patients are more likely to have damaged skin through aging, and this may increase side effects. This medicine should only be used for brief periods and infrequently in older patients. Overdosage: If you think you have taken too much of this medicine contact a poison control center or emergency room at once. NOTE: This medicine is only for you. Do not share this medicine with others. What if I miss a dose? If you miss a dose, use it as soon as you can. If it is almost time for your next dose, use only that dose. Do not use double or extra doses. What may interact with this medicine? Interactions are not expected. Do not use any other skin products on the affected area without asking your doctor or health care professional. This list may not describe all possible interactions. Give your health care provider a list of all the medicines, herbs, non-prescription drugs, or dietary supplements you use. Also tell them if you smoke, drink alcohol, or use illegal drugs. Some items may interact with your medicine. What should I watch for while using this medicine? Tell your doctor or health care professional if your symptoms do not start to get better within 7 days or if they get worse. Tell your doctor or health care professional if you are exposed to anyone with measles or chickenpox, or if you develop sores or blisters that do not heal properly. What side effects may I notice from receiving this medicine? Side effects that you should report to your doctor or health care professional as soon as possible: -allergic reactions like skin rash, itching or hives, swelling  of the face, lips, or tongue -burning feeling on the skin -dark red spots on the skin -infection -lack of healing of skin condition -painful, red, pus filled blisters in hair follicles -thinning of the skin Side effects that usually do not require medical attention (report to your doctor or health care professional if they continue or are bothersome): -dry skin, irritation -unusual increased growth of hair on the face or body This list may not describe all possible side effects. Call your doctor for medical advice about side effects. You may report side effects to FDA at 1-800-FDA-1088. Where should I keep my medicine? Keep out of the reach of children. Store at room temperature between 15 and 30 degrees C (59 and 86 degrees F). Do not freeze. Throw away any unused medicine after the expiration date. NOTE: This sheet is a summary. It may not cover all possible information. If you have questions about this medicine, talk to your doctor, pharmacist, or health care provider.  2015, [employer] Standard. (2008-03-18 16:08:48)

## 2015-04-05 NOTE — Assessment & Plan Note (Signed)
Lab Results  Component Value Date   HGBA1C 6.5 04/05/2015   HGBA1C 6.8 12/21/2014   HGBA1C 7.1 07/27/2014     Assessment: Diabetes control: good control (HgbA1C at goal) Progress toward A1C goal:  at goal Comments: She is doing well, eating more regularly, no reported symptoms of hypoglycemia.   Plan: Medications:  continue current medications, glimepiride Home glucose monitoring: Frequency:   Timing:   Instruction/counseling given: reminded to get eye exam Educational resources provided: brochure Self management tools provided: copy of home glucose meter download Other plans: LDL is at goal, BP is at goal.  Foot exam was checked in the last year.

## 2015-04-05 NOTE — Assessment & Plan Note (Signed)
BP Readings from Last 3 Encounters:  04/05/15 139/52  02/02/15 128/56  12/21/14 136/60    Lab Results  Component Value Date   NA 138 12/21/2014   K 3.9 12/21/2014   CREATININE 1.07 12/21/2014    Assessment: Blood pressure control: controlled Progress toward BP goal:  at goal Comments: doing well, no complaints  Plan: Medications:  continue current medications, metoprolol, valsartan Educational resources provided: brochure Self management tools provided: home blood pressure logbook Other plans: No blood work today.

## 2015-04-14 ENCOUNTER — Encounter: Payer: Self-pay | Admitting: *Deleted

## 2015-04-20 ENCOUNTER — Encounter: Payer: Self-pay | Admitting: Internal Medicine

## 2015-04-20 DIAGNOSIS — H35033 Hypertensive retinopathy, bilateral: Secondary | ICD-10-CM | POA: Insufficient documentation

## 2015-05-11 ENCOUNTER — Other Ambulatory Visit: Payer: Self-pay | Admitting: *Deleted

## 2015-05-11 MED ORDER — ESOMEPRAZOLE MAGNESIUM 40 MG PO CPDR: DELAYED_RELEASE_CAPSULE | ORAL | Status: DC

## 2015-06-07 ENCOUNTER — Other Ambulatory Visit: Payer: Self-pay | Admitting: Internal Medicine

## 2015-06-13 ENCOUNTER — Other Ambulatory Visit: Payer: Self-pay | Admitting: Internal Medicine

## 2015-06-15 DIAGNOSIS — G5602 Carpal tunnel syndrome, left upper limb: Secondary | ICD-10-CM | POA: Diagnosis not present

## 2015-06-15 DIAGNOSIS — M545 Low back pain: Secondary | ICD-10-CM | POA: Diagnosis not present

## 2015-06-15 DIAGNOSIS — M25511 Pain in right shoulder: Secondary | ICD-10-CM | POA: Diagnosis not present

## 2015-06-19 ENCOUNTER — Other Ambulatory Visit: Payer: Self-pay | Admitting: Internal Medicine

## 2015-07-10 ENCOUNTER — Other Ambulatory Visit: Payer: Self-pay | Admitting: Cardiology

## 2015-07-31 DIAGNOSIS — L811 Chloasma: Secondary | ICD-10-CM | POA: Diagnosis not present

## 2015-08-09 NOTE — Patient Outreach (Signed)
[facility]) Care Management  08/09/2015  Christine [name] 01/24/1934 [id number]   Referral from [place] 2 List, assigned [name], RN to outreach.  Thanks, Ronnell Freshwater. [place], [place] Assistant Phone: [phone] Fax: [phone]

## 2015-08-14 DIAGNOSIS — M65322 Trigger finger, left index finger: Secondary | ICD-10-CM | POA: Diagnosis not present

## 2015-08-14 DIAGNOSIS — M19042 Primary osteoarthritis, left hand: Secondary | ICD-10-CM | POA: Diagnosis not present

## 2015-08-14 DIAGNOSIS — M545 Low back pain: Secondary | ICD-10-CM | POA: Diagnosis not present

## 2015-08-14 DIAGNOSIS — M19041 Primary osteoarthritis, right hand: Secondary | ICD-10-CM | POA: Diagnosis not present

## 2015-08-15 ENCOUNTER — Other Ambulatory Visit: Payer: Self-pay

## 2015-08-15 MED ORDER — ESOMEPRAZOLE MAGNESIUM 40 MG PO CPDR: DELAYED_RELEASE_CAPSULE | ORAL | Status: DC

## 2015-08-16 ENCOUNTER — Other Ambulatory Visit: Payer: Self-pay | Admitting: Internal Medicine

## 2015-08-16 ENCOUNTER — Encounter: Payer: Medicare Other | Admitting: Internal Medicine

## 2015-08-21 ENCOUNTER — Other Ambulatory Visit: Payer: Self-pay | Admitting: Internal Medicine

## 2015-08-23 ENCOUNTER — Other Ambulatory Visit: Payer: Self-pay

## 2015-08-23 NOTE — Patient Outreach (Signed)
[place] [facility]) Care Management  08/23/2015  SINDY MCCUNE 01/24/1934 [id number]   Unsuccessful attempt to reach patient referred from [place] 2 list.  Message left requesting call back.  [name], RN, MSN [place] [phone] Fax [phone]

## 2015-08-28 ENCOUNTER — Emergency Department (HOSPITAL_COMMUNITY): Payer: Medicare Other

## 2015-08-28 ENCOUNTER — Telehealth: Payer: Self-pay | Admitting: Cardiology

## 2015-08-28 ENCOUNTER — Encounter (HOSPITAL_COMMUNITY): Payer: Self-pay | Admitting: *Deleted

## 2015-08-28 ENCOUNTER — Emergency Department (HOSPITAL_COMMUNITY)
Admission: EM | Admit: 2015-08-28 | Discharge: 2015-08-28 | Disposition: A | Discharge status: Home / Self Care | Payer: Medicare Other | Attending: Emergency Medicine | Admitting: Emergency Medicine

## 2015-08-28 DIAGNOSIS — R42 Dizziness and giddiness: Secondary | ICD-10-CM | POA: Diagnosis not present

## 2015-08-28 DIAGNOSIS — M47819 Spondylosis without myelopathy or radiculopathy, site unspecified: Secondary | ICD-10-CM | POA: Diagnosis not present

## 2015-08-28 DIAGNOSIS — Z7952 Long term (current) use of systemic steroids: Secondary | ICD-10-CM | POA: Diagnosis not present

## 2015-08-28 DIAGNOSIS — N183 Chronic kidney disease, stage 3 (moderate): Secondary | ICD-10-CM | POA: Insufficient documentation

## 2015-08-28 DIAGNOSIS — M19041 Primary osteoarthritis, right hand: Secondary | ICD-10-CM | POA: Diagnosis not present

## 2015-08-28 DIAGNOSIS — J441 Chronic obstructive pulmonary disease with (acute) exacerbation: Secondary | ICD-10-CM | POA: Insufficient documentation

## 2015-08-28 DIAGNOSIS — E119 Type 2 diabetes mellitus without complications: Secondary | ICD-10-CM | POA: Insufficient documentation

## 2015-08-28 DIAGNOSIS — Z9889 Other specified postprocedural states: Secondary | ICD-10-CM | POA: Insufficient documentation

## 2015-08-28 DIAGNOSIS — E039 Hypothyroidism, unspecified: Secondary | ICD-10-CM | POA: Diagnosis not present

## 2015-08-28 DIAGNOSIS — I5030 Unspecified diastolic (congestive) heart failure: Secondary | ICD-10-CM | POA: Diagnosis not present

## 2015-08-28 DIAGNOSIS — R079 Chest pain, unspecified: Secondary | ICD-10-CM | POA: Diagnosis not present

## 2015-08-28 DIAGNOSIS — M19042 Primary osteoarthritis, left hand: Secondary | ICD-10-CM | POA: Insufficient documentation

## 2015-08-28 DIAGNOSIS — M160 Bilateral primary osteoarthritis of hip: Secondary | ICD-10-CM | POA: Diagnosis not present

## 2015-08-28 DIAGNOSIS — R011 Cardiac murmur, unspecified: Secondary | ICD-10-CM | POA: Diagnosis not present

## 2015-08-28 DIAGNOSIS — R0600 Dyspnea, unspecified: Secondary | ICD-10-CM

## 2015-08-28 DIAGNOSIS — K219 Gastro-esophageal reflux disease without esophagitis: Secondary | ICD-10-CM | POA: Insufficient documentation

## 2015-08-28 DIAGNOSIS — Z880 Allergy status to penicillin: Secondary | ICD-10-CM | POA: Diagnosis not present

## 2015-08-28 DIAGNOSIS — R5383 Other fatigue: Secondary | ICD-10-CM | POA: Diagnosis not present

## 2015-08-28 DIAGNOSIS — I130 Hypertensive heart and chronic kidney disease with heart failure and stage 1 through stage 4 chronic kidney disease, or unspecified chronic kidney disease: Secondary | ICD-10-CM | POA: Insufficient documentation

## 2015-08-28 DIAGNOSIS — M542 Cervicalgia: Secondary | ICD-10-CM | POA: Diagnosis not present

## 2015-08-28 DIAGNOSIS — I2510 Atherosclerotic heart disease of native coronary artery without angina pectoris: Secondary | ICD-10-CM | POA: Diagnosis not present

## 2015-08-28 DIAGNOSIS — R0602 Shortness of breath: Secondary | ICD-10-CM | POA: Diagnosis present

## 2015-08-28 LAB — BASIC METABOLIC PANEL
Anion gap: 9 (ref 5–15)
BUN: 16 mg/dL (ref 6–20)
CALCIUM: 9.4 mg/dL (ref 8.9–10.3)
CHLORIDE: 99 mmol/L — AB (ref 101–111)
CO2: 28 mmol/L (ref 22–32)
CREATININE: 1.08 mg/dL — AB (ref 0.44–1.00)
GFR, EST AFRICAN AMERICAN: 55 mL/min — AB (ref >60)
GFR, EST NON AFRICAN AMERICAN: 48 mL/min — AB (ref >60)
Glucose, Bld: 126 mg/dL — ABNORMAL HIGH (ref 65–99)
Potassium: 3.9 mmol/L (ref 3.5–5.1)
SODIUM: 136 mmol/L (ref 135–145)

## 2015-08-28 LAB — CBC
HCT: 38.4 % (ref 36.0–46.0)
Hemoglobin: 12.4 g/dL (ref 12.0–15.0)
MCH: 29.2 pg (ref 26.0–34.0)
MCHC: 32.3 g/dL (ref 30.0–36.0)
MCV: 90.4 fL (ref 78.0–100.0)
PLATELETS: 271 10*3/uL (ref 150–400)
RBC: 4.25 MIL/uL (ref 3.87–5.11)
RDW: 14.9 % (ref 11.5–15.5)
WBC: 5.5 10*3/uL (ref 4.0–10.5)

## 2015-08-28 LAB — URINALYSIS, ROUTINE W REFLEX MICROSCOPIC
Bilirubin Urine: NEGATIVE
GLUCOSE, UA: NEGATIVE mg/dL
HGB URINE DIPSTICK: NEGATIVE
KETONES UR: NEGATIVE mg/dL
Nitrite: NEGATIVE
PH: 5 (ref 5.0–8.0)
PROTEIN: NEGATIVE mg/dL
Specific Gravity, Urine: 1.008 (ref 1.005–1.030)
Urobilinogen, UA: 0.2 mg/dL (ref 0.0–1.0)

## 2015-08-28 LAB — BRAIN NATRIURETIC PEPTIDE: B NATRIURETIC PEPTIDE 5: 104.1 pg/mL — AB (ref 0.0–100.0)

## 2015-08-28 LAB — URINE MICROSCOPIC-ADD ON

## 2015-08-28 LAB — I-STAT TROPONIN, ED: TROPONIN I, POC: 0.01 ng/mL (ref 0.00–0.08)

## 2015-08-28 NOTE — ED Notes (Signed)
Pt reports sob and dizziness since last Monday, generalized fatigue and also intermittent left side chest pain and neck pain. ekg done at triage, appears in no acute resp distress.

## 2015-08-28 NOTE — Telephone Encounter (Signed)
New Message  Pt calling to speak w/ RN- pt did not want to specify nature of phone call. Please call back and discuss.

## 2015-08-28 NOTE — Discharge Instructions (Signed)
As discussed, it is important that you follow up with your cardiologist, and your primary care physician.  You may consider discussing having an echocardiogram performed with your cardiologist.  Return here for concerning changes in your condition.

## 2015-08-28 NOTE — Telephone Encounter (Signed)
Pt states for the last week she has been having discomfort in her chest when she does any exertion. Pt states as long as she rests she does not have any symptoms. Pt states this is associated with SOB and dizziness.  Pt states she took a shower earlier today, developed shoulder and neck pain associated with SOB. Pt states her SOB has resolved but she continues to have shoulder and neck pain.  Pt advised to report to ED to have symptoms evaluated, pt advised not to drive.

## 2015-08-28 NOTE — ED Provider Notes (Signed)
CSN: [id number]     Arrival date & time 08/28/15  1612 History   First MD Initiated Contact with Patient 08/28/15 1708     Chief Complaint  Patient presents with  . Shortness of Breath  . Dizziness  . Chest Pain     (Consider location/radiation/quality/duration/timing/severity/associated sxs/prior Treatment) HPI Patient presents with concern of dizziness, dyspnea, fatigue. This episode began about one day ago, since onset patient has had persistent fatigue, with lightheadedness, and dyspnea with minimal exertion. Over the past week patient has had episodic chest pain, neck pain, though none on my exam. Prior to one week ago, no similar recent events. No recent medication changes, diet changes, activity changes. The chest pain is episodically refill, sternal, nonradiating.  It occurs without clear precipitant, and stops without any intervention. Past Medical History  Diagnosis Date  . GERD (gastroesophageal reflux disease)   . Hypertension   . Hypothyroidism   . Anemia   . Hyperlipidemia   . Sinus arrhythmia   . History of migraine headaches     a. Next atypical symptoms in the past. Patient was started on Neurontin for possible neuropathic origin of her pain.  Marland Kitchen COPD (chronic obstructive pulmonary disease) ([place])     a. PFTs 2011 show FEV1/FVC of 96%  . Heart murmur, systolic     2-D echo in December 2011 showed a normal EF with grade 1 diastolic dysfunction, trivial pulmonary regurgitation and mildly elevated PA pressure at 37 mmHg probably secondary to her COPD.dynamic obstruction-mid cavity obliteration;  b. 02/2012 Echo: EF 60-65%, mild LVH, PASP 44mmHg.  Marland Kitchen CAD (coronary artery disease)     a. LHC 08/23/11: dLM 40-50%, oRI 40%, oCFX 40%, oD1 70% (small and not amenable to PCI).  LM lesion did not appear to be flow limiting.  Medical rx was recommended;  b. Echo 08/23/11: mild LVH, EF 60-65%, grade 1 diast dysfxn, mild BAE, PASP 24;  c. 02/2012  Cath: LM 50d, LAD 50p, D1 70ost, RI  70p, RCA ok->Med Rx;  d. 01/2013 Cardiolite: EF 88, no ischemia/infarct.  . Carotid stenosis     a. dopplers 10/12:  0-39% bilat ICA;  b. 10/2012 U/S: 0-39% bilat, f/u 1 yr (10/2013).  . Chronic bronchitis   . Type II diabetes mellitus ([place])     a. On oral hypoglycemic agents.  . Blood transfusion 1972    "after daughter born, attempted to give me blood; couldn't give it cause my blood was cold" (09/23/2013)  . History of stomach ulcers 1970's  . Polymyalgia rheumatica ([place])   . Diverticulosis of colon (without mention of hemorrhage)   . 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 Migraines   . Anginal pain ([place])   . Shortness of breath on exertion     "just related to angina >1 yr ago" (09/23/2013)  . Arthritis     "back, arms, hips" (09/23/2013)   Past Surgical History  Procedure Laterality Date  . Cataract extraction w/ intraocular lens  implant, bilateral Bilateral 1990's  . Total abdominal hysterectomy  1976  . Left heart catheterization with coronary angiogram N/A 03/16/2012    Procedure: LEFT HEART CATHETERIZATION WITH CORONARY ANGIOGRAM;  Surgeon: Hillary Bow, MD;  Location: [facility] CATH LAB;  Service: Cardiovascular;  Laterality: N/A;   Family History  Problem Relation Age of Onset  . Colon cancer Maternal Grandmother   . COPD Father   . Other Mother  died of unknown causes in her 62's. Pt raised by grandmother.  . Pulmonary Hypertension Daughter    Social History  Substance Use Topics  . Smoking status: Never Smoker   . Smokeless tobacco: Never Used  . Alcohol Use: No     Comment: "Last drink 1967"   OB History    No data available     Review of Systems  Constitutional:       Per HPI, otherwise negative  HENT:       Per HPI, otherwise negative  Respiratory:       Per HPI, otherwise negative  Cardiovascular:       Per HPI, otherwise negative  Gastrointestinal: Negative for vomiting.   Endocrine:       Negative aside from HPI  Genitourinary:       Neg aside from HPI   Musculoskeletal:       Per HPI, otherwise negative  Skin: Negative.   Neurological: Positive for weakness and light-headedness. Negative for syncope.      Allergies  Aspirin; Codeine; and Penicillins  Home Medications   Prior to Admission medications   Medication Sig Start Date End Date Taking? Authorizing Provider  acetaminophen (TYLENOL) 500 MG tablet Take 1,000 mg by mouth every 6 (six) hours as needed for headache. For pain   Yes Historical Provider, MD  ADVAIR DISKUS 250-50 MCG/DOSE AEPB inhale 1 dose by mouth every 12 hours 08/25/14  Yes Bartholomew Crews, MD  albuterol (PROVENTIL HFA;VENTOLIN HFA) 108 (90 BASE) MCG/ACT inhaler Inhale 2 puffs into the lungs every 6 (six) hours as needed for wheezing or shortness of breath. 04/27/14  Yes [name], MD  atorvastatin (LIPITOR) 40 MG tablet Take 1 tablet (40 mg total) by mouth every evening. 03/01/15  Yes [name], MD  clopidogrel (PLAVIX) 75 MG tablet take 1 tablet by mouth once daily 08/16/15  Yes [name], MD  esomeprazole (NEXIUM) 40 MG capsule take 1 capsule by mouth once daily BEFORE BREAKFAST 08/15/15  Yes Inda Castle, MD  glimepiride (AMARYL) 1 MG tablet take 1 tablet by mouth once daily BEFORE BREAKFAST 08/23/15  Yes [name], MD  guaiFENesin (MUCINEX) 600 MG 12 hr tablet Take 600 mg by mouth 2 (two) times daily.   Yes Historical Provider, MD  hydroxypropyl methylcellulose (ISOPTO TEARS) 2.5 % ophthalmic solution Place 1 drop into both eyes 4 (four) times daily as needed for dry eyes.   Yes Historical Provider, MD  imipramine (TOFRANIL) 50 MG tablet take 1 tablet by mouth twice a day 06/19/15  Yes [name], MD  isosorbide mononitrate (IMDUR) 60 MG 24 hr tablet take 2 tablets by mouth once daily 07/10/15  Yes Larey Dresser, MD  levothyroxine (SYNTHROID, LEVOTHROID) 25 MCG tablet take 1 tablet by mouth once daily  06/08/15  Yes [name], MD  loratadine (CLARITIN) 10 MG tablet Take 1 tablet (10 mg total) by mouth daily. 10/11/14  Yes [name], MD  metoprolol succinate (TOPROL-XL) 100 MG 24 hr tablet Take 1 tablet (100 mg total) by mouth daily. Take with or immediately following a meal. 07/28/14  Yes [name], MD  Multiple Vitamin (MULITIVITAMIN WITH MINERALS) TABS Take 1 tablet by mouth daily.   Yes Historical Provider, MD  NITROSTAT 0.4 MG SL tablet place 1 tablet under the tongue if needed every 5 minutes for chest pain for 3 doses IF NO RELIEF AFTER 3RD DOSE CALL PRESCRIBER OR 911. 06/15/15  Yes [name], MD  promethazine (PHENERGAN) 12.5 MG tablet Take 1 tablet (12.5 mg total) by mouth every 6 (six) hours as needed for nausea or vomiting (Prior to pain medications). 07/27/14  Yes [name], MD  pseudoephedrine (SUDAFED) 30 MG tablet Take 30 mg by mouth every 4 (four) hours as needed for congestion.   Yes Historical Provider, MD  sodium chloride (OCEAN) 0.65 % SOLN nasal spray Place 1 spray into both nostrils as needed for congestion. 12/22/13  Yes Dixon Boos, MD  traMADol (ULTRAM) 50 MG tablet Take 50 mg by mouth every 12 (twelve) hours as needed for severe pain.  08/14/15  Yes Historical Provider, MD  valsartan (DIOVAN) 320 MG tablet Take 1 tablet (320 mg total) by mouth daily. 10/08/14  Yes [name], MD  glucose blood (ONE TOUCH ULTRA TEST) test strip 1 each by Other route as needed for other. Use 1 to 2 times daily to check blood sugar. diag code 250.02. Non-insulin dependent 06/08/14   [name], MD  hydrocortisone cream 1 % Apply to affected area of chest 2 times daily for 7 days only 04/05/15 04/04/16  [name], MD  [facility] DELICA LANCETS 20U MISC Inject 1 each into the skin as needed. Use 1 to 2 times daily to check blood sugar. diag code 250.02. Non-insulin dependent 06/08/14   [name], MD   BP 145/52 mmHg  Pulse 55  Temp(Src) 98.1 F (36.7 C)  (Oral)  Resp 16  SpO2 99%  LMP 02/11/1975 Physical Exam  Constitutional: She is oriented to person, place, and time. She has a sickly appearance. No distress.  Frail, elderly appearing F answering questions appropriately, in NAD  HENT:  Head: Normocephalic and atraumatic.  Eyes: Conjunctivae and EOM are normal.  Cardiovascular: Normal rate and regular rhythm.   Pulmonary/Chest: Effort normal and breath sounds normal. No stridor. No respiratory distress.  Abdominal: She exhibits no distension.  Musculoskeletal: She exhibits no edema.  Neurological: She is alert and oriented to person, place, and time. She displays atrophy. She displays no tremor. No cranial nerve deficit. She exhibits normal muscle tone. She displays no seizure activity. Coordination normal.  Skin: Skin is warm and dry.  Psychiatric: She has a normal mood and affect.  Nursing note and vitals reviewed.   ED Course  Procedures (including critical care time) Labs Review Labs Reviewed  BASIC METABOLIC PANEL - Abnormal; Notable for the following:    Chloride 99 (*)    Glucose, Bld 126 (*)    Creatinine, Ser 1.08 (*)    GFR calc non Af Amer 48 (*)    GFR calc Af Amer 55 (*)    All other components within normal limits  BRAIN NATRIURETIC PEPTIDE - Abnormal; Notable for the following:    B Natriuretic Peptide 104.1 (*)    All other components within normal limits  URINALYSIS, ROUTINE W REFLEX MICROSCOPIC (NOT AT [facility]) - Abnormal; Notable for the following:    APPearance CLOUDY (*)    Leukocytes, UA SMALL (*)    All other components within normal limits  CBC  URINE MICROSCOPIC-ADD ON  I-STAT TROPOININ, ED    Imaging Review Dg Chest 2 View  08/28/2015   CLINICAL DATA:  Shortness of breath for 1 week, chest pain radiating to neck. Hypertension. Diabetes.  EXAM: CHEST  2 VIEW  COMPARISON:  Chest x-ray dated 06/15/2014.  FINDINGS: Heart size and mediastinal contours are normal. Lungs are clear. Lung  volumes are  normal. No pleural effusion. No pneumothorax.  Again noted is a focal mild eventration of the left hemidiaphragm, of unlikely clinical significance. Soft tissues about the chest are otherwise unremarkable. Degenerative changes noted at the bilateral shoulders, mild to moderate in degree, not significantly changed. Mild degenerative change also again noted within the thoracic spine. No acute osseous abnormality.  IMPRESSION: 1. No evidence of acute cardiopulmonary abnormality. Lungs are clear. 2. Chronic/incidental findings detailed above.   Electronically Signed   By: Franki Cabot M.D.   On: 08/28/2015 17:36   I have personally reviewed and evaluated these images and lab results as part of my medical decision-making.   EKG Interpretation   Date/Time:  Monday August 28 2015 16:36:57 EDT Ventricular Rate:  59 PR Interval:  164 QRS Duration: 80 QT Interval:  440 QTC Calculation: 435 R Axis:   -12 Text Interpretation:  Sinus bradycardia with Premature atrial complexes  RSR' or QR pattern in V1 suggests right ventricular conduction delay  Minimal voltage criteria for LVH, may be normal variant Borderline ECG  Sinus rhythm Artifact Left axis deviation Left ventricular hypertrophy  Abnormal ekg No significant change since last tracing Abnormal ekg  Confirmed by Carmin Muskrat  MD [phone]) on 08/28/2015 6:11:31 PM     Cardiac: 60 sr, nml  O2- 99%ra, nml   EMR:  ECHO 2013  Left ventriculography: Left ventricular systolic function is normal, and hyperdynamic with a post VPB beat.  No WMA.  No obvious MR.     Final Conclusions:   1.  Preserved LV function 2.  50% distal LMCA extending to the LAD 3.  Moderately high grade first diagonal. 4.  Involvement of two ramus subbranches as noted.    CATH 2013 w mult vessel disease, not amenable to PCI, medical management initiated.  11:07 PM On repeat exam the patient is in no distress. I discussed all findings with the patient, and 3 family  members. Specifically we discussed the patient's history of CAD, HTN, CKD, catheterization and echocardiogram 3 years ago. With consideration of aortic stenosis is contributory as well, the patient was encouraged to follow-up with cardiology, primary care, with possible echocardiogram.  This elderly female with multiple medical problems presents with multiple complaints. Here the patient is awake, alert, moving extremities spontaneously, with no evidence for neurologic compromise. Nor is patient have evidence for ongoing ACS, nor decompensated heart failure. No evidence for infectious etiology. Patient's presentation is likely multifactorial, and the patient improved here. Given the reassuring findings, though she hasn't noted medical issues, she was discharged in stable condition to follow-up with cardiology for consideration of additional echocardiogram versus other imaging modality for previously identified CAD, aortic pathology.    Carmin Muskrat, MD 08/28/15 2312

## 2015-08-29 ENCOUNTER — Telehealth: Payer: Self-pay | Admitting: Cardiology

## 2015-08-29 DIAGNOSIS — R0789 Other chest pain: Secondary | ICD-10-CM

## 2015-08-29 DIAGNOSIS — R0602 Shortness of breath: Secondary | ICD-10-CM

## 2015-08-29 NOTE — Telephone Encounter (Signed)
New problem ° ° °Pt returning your call. °

## 2015-08-29 NOTE — Telephone Encounter (Signed)
She needs Cardiolite then followup with me after that.

## 2015-08-29 NOTE — Telephone Encounter (Signed)
Pt states she went to ED yesterday for chest pain and shortness of breath.  Pt states she was advised to call our office to schedule an echocardiogram and follow up appt.  Pt advised I will forward to Dr Aundra Dubin for review.

## 2015-08-29 NOTE — Telephone Encounter (Signed)
Dr Lavera Guise she need echo also? Pt states ED told her she should have echocardiogram.

## 2015-08-29 NOTE — Telephone Encounter (Signed)
I think she needs Cardiolite, not echo.

## 2015-08-30 ENCOUNTER — Other Ambulatory Visit: Payer: Self-pay

## 2015-08-30 NOTE — Patient Outreach (Signed)
[facility]) Care Management  08/30/2015  Christine [name] 01/24/1934 [id number]   Successful contact with patient referred from [place] 2 list.  Explanation of [facility] services provided, but patient declined care management services.  RN Health Coach will mail [facility] pamphlet with contact information should patient decide to accept services.  [name], RN, MSN [place] [phone] Fax [phone]

## 2015-08-30 NOTE — Telephone Encounter (Signed)
F/u  Pt following up on previous note. Please call back and discuss.   

## 2015-08-30 NOTE — Telephone Encounter (Signed)
Called patient about Dr. Aundra Dubin wanting to get a stress test (myoview). Patient give instructions for preparing for Myoview test. Patient verbalized understanding. Informed patient that someone would call her to schedule test.

## 2015-08-31 ENCOUNTER — Other Ambulatory Visit: Payer: Self-pay | Admitting: Internal Medicine

## 2015-08-31 ENCOUNTER — Telehealth (HOSPITAL_COMMUNITY): Payer: Self-pay

## 2015-08-31 NOTE — Telephone Encounter (Signed)
Patient given detailed instructions per Myocardial Perfusion Study Information Sheet for test on 09-05-2015 at 0745. Patient notified to arrive 15 minutes early and that it is imperative to arrive on time for appointment to keep from having the test rescheduled.  If you need to cancel or reschedule your appointment, please call the office within 24 hours of your appointment. Failure to do so may result in a cancellation of your appointment, and a $50 no show fee. Patient verbalized understanding. Oletta Lamas, Mayson Sterbenz A

## 2015-09-05 ENCOUNTER — Ambulatory Visit (HOSPITAL_COMMUNITY): Payer: Medicare Other | Attending: Internal Medicine

## 2015-09-05 DIAGNOSIS — E119 Type 2 diabetes mellitus without complications: Secondary | ICD-10-CM | POA: Diagnosis not present

## 2015-09-05 DIAGNOSIS — I779 Disorder of arteries and arterioles, unspecified: Secondary | ICD-10-CM | POA: Diagnosis not present

## 2015-09-05 DIAGNOSIS — R5383 Other fatigue: Secondary | ICD-10-CM | POA: Diagnosis not present

## 2015-09-05 DIAGNOSIS — R0609 Other forms of dyspnea: Secondary | ICD-10-CM | POA: Insufficient documentation

## 2015-09-05 DIAGNOSIS — R0789 Other chest pain: Secondary | ICD-10-CM | POA: Insufficient documentation

## 2015-09-05 DIAGNOSIS — R0602 Shortness of breath: Secondary | ICD-10-CM

## 2015-09-05 DIAGNOSIS — I10 Essential (primary) hypertension: Secondary | ICD-10-CM | POA: Insufficient documentation

## 2015-09-05 LAB — MYOCARDIAL PERFUSION IMAGING
CHL CUP NUCLEAR SSS: 10
CHL CUP STRESS STAGE 1 DBP: 86 mmHg
CHL CUP STRESS STAGE 1 GRADE: 0 %
CHL CUP STRESS STAGE 1 SBP: 151 mmHg
CHL CUP STRESS STAGE 1 SPEED: 0 mph
CHL CUP STRESS STAGE 2 GRADE: 0 %
CHL CUP STRESS STAGE 2 HR: 51 {beats}/min
CHL CUP STRESS STAGE 2 SPEED: 0 mph
CHL CUP STRESS STAGE 3 DBP: 75 mmHg
CHL CUP STRESS STAGE 3 SBP: 153 mmHg
CHL CUP STRESS STAGE 4 SPEED: 0 mph
CHL CUP STRESS STAGE 5 DBP: 67 mmHg
CHL CUP STRESS STAGE 5 GRADE: 0 %
CHL CUP STRESS STAGE 5 SPEED: 0 mph
CHL CUP STRESS STAGE 6 HR: 65 {beats}/min
CHL CUP STRESS STAGE 6 SPEED: 0 mph
CSEPPMHR: 47 %
Estimated workload: 1 METS
LHR: 0.27
LVDIAVOL: 42 mL
LVSYSVOL: 7 mL
NUC STRESS TID: 0.99
Peak HR: 67 {beats}/min
Rest HR: 54 {beats}/min
SDS: 8
SRS: 2
Stage 1 HR: 54 {beats}/min
Stage 3 Grade: 0 %
Stage 3 HR: 65 {beats}/min
Stage 3 Speed: 0 mph
Stage 4 Grade: 0 %
Stage 4 HR: 67 {beats}/min
Stage 5 HR: 64 {beats}/min
Stage 5 SBP: 137 mmHg
Stage 6 DBP: 65 mmHg
Stage 6 Grade: 0 %
Stage 6 SBP: 139 mmHg

## 2015-09-05 MED ORDER — TECHNETIUM TC 99M SESTAMIBI GENERIC - CARDIOLITE
10.8000 | Freq: Once | INTRAVENOUS | Status: AC | PRN
  Administered 2015-09-05: 11 via INTRAVENOUS

## 2015-09-05 MED ORDER — REGADENOSON 0.4 MG/5ML IV SOLN
0.4000 mg | Freq: Once | INTRAVENOUS | Status: AC
  Administered 2015-09-05: 0.4 mg via INTRAVENOUS

## 2015-09-05 MED ORDER — TECHNETIUM TC 99M SESTAMIBI GENERIC - CARDIOLITE
30.4000 | Freq: Once | INTRAVENOUS | Status: AC | PRN
  Administered 2015-09-05: 30 via INTRAVENOUS

## 2015-09-11 ENCOUNTER — Ambulatory Visit (INDEPENDENT_AMBULATORY_CARE_PROVIDER_SITE_OTHER): Payer: Medicare Other | Admitting: Cardiology

## 2015-09-11 ENCOUNTER — Encounter: Payer: Self-pay | Admitting: *Deleted

## 2015-09-11 ENCOUNTER — Encounter: Payer: Self-pay | Admitting: Cardiology

## 2015-09-11 VITALS — BP 112/58 | HR 73 | Ht 65.0 in | Wt 131.0 lb

## 2015-09-11 DIAGNOSIS — I2583 Coronary atherosclerosis due to lipid rich plaque: Secondary | ICD-10-CM

## 2015-09-11 DIAGNOSIS — I10 Essential (primary) hypertension: Secondary | ICD-10-CM

## 2015-09-11 DIAGNOSIS — R0602 Shortness of breath: Secondary | ICD-10-CM | POA: Diagnosis not present

## 2015-09-11 DIAGNOSIS — E785 Hyperlipidemia, unspecified: Secondary | ICD-10-CM

## 2015-09-11 DIAGNOSIS — R0609 Other forms of dyspnea: Secondary | ICD-10-CM | POA: Insufficient documentation

## 2015-09-11 DIAGNOSIS — R0600 Dyspnea, unspecified: Secondary | ICD-10-CM | POA: Insufficient documentation

## 2015-09-11 DIAGNOSIS — I2510 Atherosclerotic heart disease of native coronary artery without angina pectoris: Secondary | ICD-10-CM | POA: Diagnosis not present

## 2015-09-11 NOTE — Patient Instructions (Signed)
Medication Instructions:  No changes today  Labwork: None today  Testing/Procedures: Your physician has requested that you have an echocardiogram. Echocardiography is a painless test that uses sound waves to create images of your heart. It provides your doctor with information about the size and shape of your heart and how well your heart's chambers and valves are working. This procedure takes approximately one hour. There are no restrictions for this procedure.  Your physician has recommended that you have a pulmonary function test. Pulmonary Function Tests are a group of tests that measure how well air moves in and out of your lungs.    Follow-Up: Your physician recommends that you schedule a follow-up appointment in: 2 -3 weeks with Dr Aundra Dubin or Versie Starks   Any Other Special Instructions Will Be Listed Below (If Applicable).     If you need a refill on your cardiac medications before your next appointment, please call your pharmacy.

## 2015-09-11 NOTE — Progress Notes (Signed)
[name], female   DOB: 01/24/1934, 81 y.o.   MRN: [id number] PCP: Dr. Daryll Drown  81 yo with history of CAD, HTN, and diabetes presents for cardiology followup.  I initially saw Christine [name] in the hospital in 10/12 with chest pain.  Left heart cath showed a 40-50% distal left main stenosis that was not thought to be flow-limiting.  She additionally had a 75% stenosis in a diagonal.  Chest pain resolved with medical therapy.  She was admitted again in 4/13 with chest pain and exertional dyspnea.  Cardiac enzymes were negative.  LHC looked similar to prior with 50% distal LM, 70% ostial D1, and 70% ostial ramus.  Echo showed preserved EF.  A dobutamine stress echo was done, this was submaximal but did not show evidence for ischemia.  No intervention, planned medical management.   Anti-anginal meds were titrated up and she was discharged home.  She developed chest pain (atypical) again in 2014.  Lexiscan Cardiolite was normal in 3/14.    Since I last saw Christine [name], she went to the ER in 10/16 with dyspnea.  CXR showed clear lungs and BNP was 104.  Lexiscan Cardiolite was done, no ischemia or infarction.  She continues to have exertional dyspnea, at times just walking around her house.  She is short of breath walking about 50-100 feet.  She has occasional mild, atypical left-sided chest pain.  This does not tend to occur with exertion.  No lightheadedness.  No orthopnea/PND.      Labs (2/13): K 3.3, creatinine 0.9 Labs (3/13): HDL 42, LDL 49 Labs (4/13): K 4.1, creatinine 1.01 Labs (10/13): LDL 56, HDL 38 Labs (2/14): K 3.8, creatinine 0.98 Labs (7/14): K 4.4, creatinine 1.17 Labs (12/14): LDL 75, HDL 51 Labs (9/15): K 3.7, creatinine 1.09, LDL 67, HDL 35 Labs (2/16): K 3.9, creatinine 1.07 Labs (10/16): K 3.9, creatinine 1.08, BNP 104, HCT 38.4  PMH: 1. CAD: Chest pain 10/12.  LHC with 40-50% distal LM stenosis, 70-75% ostial D1.  No flow-limiting lesions, medically managed.  Echo (10/12):  EF 60-65%, mild LV hypertrophy, PA systolic pressure 24 mmHg.  Chest pain 4/13.  LHC with 50% distal LM, 70% ostial D1, 50% proximal LAD, 70% proximal ramus.  No flow-limiting lesions, medically managed.  Echo (4/13) with EF 60-65%, mild LVH.  DSE (4/13) with no ischemia but it was a submaximal study.  Lexiscan Cardiolite (3/14) with EF 88%, no ischemia or infarction.  Lexiscan Cardiolite (10/16) with EF 82%, no ischemia/infarction.  2. DM2 3. HTN 4. Hyperlipidemia 5. CKD 6. Carotid dopplers (10/12): 0-39% bilateral carotid stenosis.  7. Allergy to aspirin => rash.  8. GERD 9. Migraines 10. Hypothyroidism 11. COPD 46. PMR: Dr. Charlestine Night.  13. ABIs (4/15): Normal 14. Sciatica  SH: Nonsmoker.  Lives in [place].   FH: CAD  Current Outpatient Prescriptions  Medication Sig Dispense Refill  . acetaminophen (TYLENOL) 500 MG tablet Take 1,000 mg by mouth every 6 (six) hours as needed for headache. For pain    . ADVAIR DISKUS 250-50 MCG/DOSE AEPB inhale 1 dose by mouth every 12 hours 60 each 11  . atorvastatin (LIPITOR) 40 MG tablet Take 1 tablet (40 mg total) by mouth every evening. 90 tablet 3  . clopidogrel (PLAVIX) 75 MG tablet take 1 tablet by mouth once daily 30 tablet 5  . esomeprazole (NEXIUM) 40 MG capsule take 1 capsule by mouth once daily BEFORE BREAKFAST 30 capsule 6  . glimepiride (AMARYL) 1 MG  tablet take 1 tablet by mouth once daily BEFORE BREAKFAST 90 tablet 3  . glucose blood (ONE TOUCH ULTRA TEST) test strip 1 each by Other route as needed for other. Use 1 to 2 times daily to check blood sugar. diag code 250.02. Non-insulin dependent 200 each 4  . guaiFENesin (MUCINEX) 600 MG 12 hr tablet Take 600 mg by mouth 2 (two) times daily.    . hydroxypropyl methylcellulose (ISOPTO TEARS) 2.5 % ophthalmic solution Place 1 drop into both eyes 4 (four) times daily as needed for dry eyes.    Marland Kitchen imipramine (TOFRANIL) 50 MG tablet take 1 tablet by mouth twice a day 60 tablet 6  . isosorbide  mononitrate (IMDUR) 60 MG 24 hr tablet take 2 tablets by mouth once daily 60 tablet 6  . levothyroxine (SYNTHROID, LEVOTHROID) 25 MCG tablet take 1 tablet by mouth once daily 90 tablet 1  . loratadine (CLARITIN) 10 MG tablet Take 1 tablet (10 mg total) by mouth daily. 30 tablet 6  . metoprolol succinate (TOPROL-XL) 100 MG 24 hr tablet Take 1 tablet (100 mg total) by mouth daily. Take with or immediately following a meal. 90 tablet 4  . Multiple Vitamin (MULITIVITAMIN WITH MINERALS) TABS Take 1 tablet by mouth daily.    Marland Kitchen NITROSTAT 0.4 MG SL tablet place 1 tablet under the tongue if needed every 5 minutes for chest pain for 3 doses IF NO RELIEF AFTER 3RD DOSE CALL PRESCRIBER OR 911. 25 tablet 3  . ONETOUCH DELICA LANCETS 33A MISC Inject 1 each into the skin as needed. Use 1 to 2 times daily to check blood sugar. diag code 250.02. Non-insulin dependent 200 each 4  . PROAIR HFA 108 (90 BASE) MCG/ACT inhaler inhale 2 puffs every 6 hours for shortness of breath or wheezing 8.5 Inhaler 11  . promethazine (PHENERGAN) 12.5 MG tablet Take 1 tablet (12.5 mg total) by mouth every 6 (six) hours as needed for nausea or vomiting (Prior to pain medications). 45 tablet 3  . sodium chloride (OCEAN) 0.65 % SOLN nasal spray Place 1 spray into both nostrils as needed for congestion. 1 Bottle 0  . valsartan (DIOVAN) 320 MG tablet Take 1 tablet (320 mg total) by mouth daily. 30 tablet 11   No current facility-administered medications for this visit.   BP 112/58 mmHg  Pulse 73  Ht 5\' 5"  (1.651 m)  Wt 131 lb (59.421 kg)  BMI 21.80 kg/m2  SpO2 99%  LMP 02/11/1975 General: NAD Neck: No JVD, no thyromegaly or thyroid nodule.  Lungs: Clear to auscultation bilaterally with normal respiratory effort. CV: Nondisplaced PMI.  Heart regular S1/S2, no S3/S4, 2/6 early SEM with clear S2.  No peripheral edema.  No carotid bruit.  1+ PT bilaterally.   Abdomen: Soft, nontender, no hepatosplenomegaly, no distention.  Neurologic:  Alert and oriented x 3.  Psych: Normal affect. Extremities: No clubbing or cyanosis.   Assessment/Plan: 1. CAD: Non-flow limiting, moderate disease on cath in 4/13. No ischemia or infarction on Lexiscan Cardiolite in 10/16.  Rare atypical chest pain.  - Continue Plavix (allergic to aspirin), Toprol XL, valsartan, Imdur. - She is on atorvastatin.   2. Hyperlipidemia: Repeat lipids next appointment.  3. HTN: BP controlled.  4. Dyspnea: Cardiolite was normal in 10/16.  BNP was minimally elevated.  CXR was clear.  Hemoglobin normal on CBC. She continues to have NYHA class III symptoms.  On exam, she is not volume overloaded and lungs are clear.  I am uncertain  about the etiology of her symptoms.   - Echo to assess LV/RV function and for valvular disease, she does have an aortic area murmur on exam.  - PFTs to assess for significant lung disease.  - If these studies are unrevealing and symptoms continue, could consider right and left heart cath.   Followup in 2-3 weeks.   Loralie Champagne 09/11/2015 3:26 PM

## 2015-09-14 ENCOUNTER — Ambulatory Visit (INDEPENDENT_AMBULATORY_CARE_PROVIDER_SITE_OTHER): Payer: Medicare Other | Admitting: Internal Medicine

## 2015-09-14 DIAGNOSIS — R0602 Shortness of breath: Secondary | ICD-10-CM

## 2015-09-14 LAB — PULMONARY FUNCTION TEST
DL/VA % PRED: 94 %
DL/VA: 4.64 ml/min/mmHg/L
DLCO UNC: 22.31 ml/min/mmHg
DLCO unc % pred: 87 %
FEF 25-75 POST: 1.6 L/s
FEF 25-75 Pre: 1.86 L/sec
FEF2575-%Change-Post: -13 %
FEF2575-%Pred-Post: 114 %
FEF2575-%Pred-Pre: 132 %
FEV1-%CHANGE-POST: -2 %
FEV1-%PRED-POST: 110 %
FEV1-%PRED-PRE: 112 %
FEV1-POST: 1.83 L
FEV1-PRE: 1.87 L
FEV1FVC-%Change-Post: -2 %
FEV1FVC-%PRED-PRE: 106 %
FEV6-%Change-Post: 0 %
FEV6-%PRED-POST: 113 %
FEV6-%PRED-PRE: 114 %
FEV6-POST: 2.33 L
FEV6-Pre: 2.35 L
FEV6FVC-%CHANGE-POST: -1 %
FEV6FVC-%PRED-POST: 102 %
FEV6FVC-%Pred-Pre: 104 %
FVC-%Change-Post: 0 %
FVC-%Pred-Post: 109 %
FVC-%Pred-Pre: 109 %
FVC-POST: 2.36 L
FVC-Pre: 2.35 L
POST FEV6/FVC RATIO: 99 %
PRE FEV1/FVC RATIO: 80 %
PRE FEV6/FVC RATIO: 100 %
Post FEV1/FVC ratio: 78 %
RV % PRED: 97 %
RV: 2.36 L
TLC % PRED: 90 %
TLC: 4.72 L

## 2015-09-14 NOTE — Progress Notes (Signed)
PFT done today. 

## 2015-09-19 ENCOUNTER — Other Ambulatory Visit: Payer: Self-pay

## 2015-09-19 ENCOUNTER — Ambulatory Visit (HOSPITAL_COMMUNITY): Payer: Medicare Other | Attending: Cardiovascular Disease

## 2015-09-19 DIAGNOSIS — I517 Cardiomegaly: Secondary | ICD-10-CM | POA: Diagnosis not present

## 2015-09-19 DIAGNOSIS — R0602 Shortness of breath: Secondary | ICD-10-CM | POA: Insufficient documentation

## 2015-09-21 ENCOUNTER — Ambulatory Visit (INDEPENDENT_AMBULATORY_CARE_PROVIDER_SITE_OTHER): Payer: Medicare Other | Admitting: Cardiology

## 2015-09-21 ENCOUNTER — Encounter: Payer: Self-pay | Admitting: *Deleted

## 2015-09-21 ENCOUNTER — Encounter: Payer: Self-pay | Admitting: Cardiology

## 2015-09-21 VITALS — BP 122/66 | HR 88 | Ht 64.0 in | Wt 131.8 lb

## 2015-09-21 DIAGNOSIS — I2583 Coronary atherosclerosis due to lipid rich plaque: Secondary | ICD-10-CM

## 2015-09-21 DIAGNOSIS — I2510 Atherosclerotic heart disease of native coronary artery without angina pectoris: Secondary | ICD-10-CM

## 2015-09-21 DIAGNOSIS — R002 Palpitations: Secondary | ICD-10-CM

## 2015-09-21 DIAGNOSIS — R0602 Shortness of breath: Secondary | ICD-10-CM | POA: Diagnosis not present

## 2015-09-21 DIAGNOSIS — R0609 Other forms of dyspnea: Secondary | ICD-10-CM | POA: Diagnosis not present

## 2015-09-21 DIAGNOSIS — R0600 Dyspnea, unspecified: Secondary | ICD-10-CM

## 2015-09-21 LAB — CBC WITH DIFFERENTIAL/PLATELET
BASOS PCT: 0 % (ref 0–1)
Basophils Absolute: 0 10*3/uL (ref 0.0–0.1)
Eosinophils Absolute: 0.2 10*3/uL (ref 0.0–0.7)
Eosinophils Relative: 3 % (ref 0–5)
HEMATOCRIT: 36.1 % (ref 36.0–46.0)
HEMOGLOBIN: 11.9 g/dL — AB (ref 12.0–15.0)
LYMPHS ABS: 2.1 10*3/uL (ref 0.7–4.0)
LYMPHS PCT: 30 % (ref 12–46)
MCH: 29.1 pg (ref 26.0–34.0)
MCHC: 33 g/dL (ref 30.0–36.0)
MCV: 88.3 fL (ref 78.0–100.0)
MONO ABS: 0.7 10*3/uL (ref 0.1–1.0)
MONOS PCT: 10 % (ref 3–12)
MPV: 9.8 fL (ref 8.6–12.4)
NEUTROS ABS: 4 10*3/uL (ref 1.7–7.7)
NEUTROS PCT: 57 % (ref 43–77)
Platelets: 291 10*3/uL (ref 150–400)
RBC: 4.09 MIL/uL (ref 3.87–5.11)
RDW: 14.5 % (ref 11.5–15.5)
WBC: 7 10*3/uL (ref 4.0–10.5)

## 2015-09-21 LAB — BASIC METABOLIC PANEL
BUN: 23 mg/dL (ref 7–25)
CHLORIDE: 100 mmol/L (ref 98–110)
CO2: 27 mmol/L (ref 20–31)
Calcium: 9.6 mg/dL (ref 8.6–10.4)
Creat: 1.18 mg/dL — ABNORMAL HIGH (ref 0.60–0.93)
GLUCOSE: 67 mg/dL (ref 65–99)
POTASSIUM: 4.2 mmol/L (ref 3.5–5.3)
SODIUM: 137 mmol/L (ref 135–146)

## 2015-09-21 LAB — TSH: TSH: 2.506 u[IU]/mL (ref 0.350–4.500)

## 2015-09-21 NOTE — Patient Instructions (Addendum)
Medication Instructions:  No changes today  Labwork: BMET/CBCd/PT/INR/TSH today  Testing/Procedures: Your physician has requested that you have a cardiac catheterization. Cardiac catheterization is used to diagnose and/or treat various heart conditions. Doctors may recommend this procedure for a number of different reasons. The most common reason is to evaluate chest pain. Chest pain can be a symptom of coronary artery disease (CAD), and cardiac catheterization can show whether plaque is narrowing or blocking your heart's arteries. This procedure is also used to evaluate the valves, as well as measure the blood flow and oxygen levels in different parts of your heart. For further information please visit [email or web address]. Please follow instruction sheet, as given. Tuesday November 8,2016   Follow-Up:  Your physician recommends that you schedule a follow-up appointment in: about 3 weeks with Dr Aundra Dubin.         If you need a refill on your cardiac medications before your next appointment, please call your pharmacy.

## 2015-09-22 LAB — PROTIME-INR
INR: 0.91 (ref <1.50)
PROTHROMBIN TIME: 12.3 s (ref 11.6–15.2)

## 2015-09-22 NOTE — Progress Notes (Signed)
[name], female   DOB: 01/24/1934, 81 y.o.   MRN: [id number] PCP: Dr. Daryll Drown  81 yo with history of CAD, HTN, and diabetes presents for cardiology followup.  I initially saw Mrs Christine [name] in the hospital in 10/12 with chest pain.  Left heart cath showed a 40-50% distal left main stenosis that was not thought to be flow-limiting.  She additionally had a 75% stenosis in a diagonal.  Chest pain resolved with medical therapy.  She was admitted again in 4/13 with chest pain and exertional dyspnea.  Cardiac enzymes were negative.  LHC looked similar to prior with 50% distal LM, 70% ostial D1, and 70% ostial ramus.  Echo showed preserved EF.  A dobutamine stress echo was done, this was submaximal but did not show evidence for ischemia.  No intervention, planned medical management.   Anti-anginal meds were titrated up and she was discharged home.  She developed chest pain (atypical) again in 2014.  Lexiscan Cardiolite was normal in 3/14.    Mrs Christine [name] went to the ER in 10/16 with dyspnea.  CXR showed clear lungs and BNP was 104.  Lexiscan Cardiolite was done, no ischemia or infarction.  She continues to have exertional dyspnea.  She is short of breath walking about 50-100 feet if she walks fast.  She is short of breath with showering or housework.  She has significantly limiting her activity and is not even going to church.  She has occasional mild, atypical left-sided chest pain.  This does not tend to occur with exertion.  No lightheadedness.  No orthopnea/PND.  Echo was done and was unremarkable.  PFTs were normal.     Labs (2/13): K 3.3, creatinine 0.9 Labs (3/13): HDL 42, LDL 49 Labs (4/13): K 4.1, creatinine 1.01 Labs (10/13): LDL 56, HDL 38 Labs (2/14): K 3.8, creatinine 0.98 Labs (7/14): K 4.4, creatinine 1.17 Labs (12/14): LDL 75, HDL 51 Labs (9/15): K 3.7, creatinine 1.09, LDL 67, HDL 35 Labs (2/16): K 3.9, creatinine 1.07 Labs (10/16): K 3.9, creatinine 1.08, BNP 104, HCT  38.4  PMH: 1. CAD: Chest pain 10/12.  LHC with 40-50% distal LM stenosis, 70-75% ostial D1.  No flow-limiting lesions, medically managed.  Echo (10/12): EF 60-65%, mild LV hypertrophy, PA systolic pressure 24 mmHg.  Chest pain 4/13.  LHC with 50% distal LM, 70% ostial D1, 50% proximal LAD, 70% proximal ramus.  No flow-limiting lesions, medically managed.  Echo (4/13) with EF 60-65%, mild LVH.  DSE (4/13) with no ischemia but it was a submaximal study.  Lexiscan Cardiolite (3/14) with EF 88%, no ischemia or infarction.  Lexiscan Cardiolite (10/16) with EF 82%, no ischemia/infarction.  Echo (11/16) with EF 60-65%, moderate LVH.  2. DM2 3. HTN 4. Hyperlipidemia 5. CKD 6. Carotid dopplers (10/12): 0-39% bilateral carotid stenosis.  7. Allergy to aspirin => rash.  8. GERD 9. Migraines 10. Hypothyroidism 11. COPD 45. PMR: Dr. Charlestine Night.  13. ABIs (4/15): Normal 14. Sciatica 15. PFTs (10/16): Normal.   SH: Nonsmoker.  Lives in [place].   FH: CAD  Current Outpatient Prescriptions  Medication Sig Dispense Refill  . acetaminophen (TYLENOL) 500 MG tablet Take 1,000 mg by mouth every 6 (six) hours as needed for headache. For pain    . ADVAIR DISKUS 250-50 MCG/DOSE AEPB inhale 1 dose by mouth every 12 hours 60 each 11  . atorvastatin (LIPITOR) 40 MG tablet Take 1 tablet (40 mg total) by mouth every evening. 90 tablet 3  . clopidogrel (  PLAVIX) 75 MG tablet take 1 tablet by mouth once daily 30 tablet 5  . esomeprazole (NEXIUM) 40 MG capsule take 1 capsule by mouth once daily BEFORE BREAKFAST 30 capsule 6  . glimepiride (AMARYL) 1 MG tablet take 1 tablet by mouth once daily BEFORE BREAKFAST 90 tablet 3  . glucose blood (ONE TOUCH ULTRA TEST) test strip 1 each by Other route as needed for other. Use 1 to 2 times daily to check blood sugar. diag code 250.02. Non-insulin dependent 200 each 4  . guaiFENesin (MUCINEX) 600 MG 12 hr tablet Take 600 mg by mouth 2 (two) times daily.    . hydroxypropyl  methylcellulose (ISOPTO TEARS) 2.5 % ophthalmic solution Place 1 drop into both eyes 4 (four) times daily as needed for dry eyes.    Marland Kitchen imipramine (TOFRANIL) 50 MG tablet take 1 tablet by mouth twice a day 60 tablet 6  . isosorbide mononitrate (IMDUR) 60 MG 24 hr tablet take 2 tablets by mouth once daily 60 tablet 6  . levothyroxine (SYNTHROID, LEVOTHROID) 25 MCG tablet take 1 tablet by mouth once daily 90 tablet 1  . loratadine (CLARITIN) 10 MG tablet Take 1 tablet (10 mg total) by mouth daily. 30 tablet 6  . metoprolol succinate (TOPROL-XL) 100 MG 24 hr tablet Take 1 tablet (100 mg total) by mouth daily. Take with or immediately following a meal. 90 tablet 4  . Multiple Vitamin (MULITIVITAMIN WITH MINERALS) TABS Take 1 tablet by mouth daily.    Marland Kitchen NITROSTAT 0.4 MG SL tablet place 1 tablet under the tongue if needed every 5 minutes for chest pain for 3 doses IF NO RELIEF AFTER 3RD DOSE CALL PRESCRIBER OR 911. 25 tablet 3  . ONETOUCH DELICA LANCETS 83J MISC Inject 1 each into the skin as needed. Use 1 to 2 times daily to check blood sugar. diag code 250.02. Non-insulin dependent 200 each 4  . PROAIR HFA 108 (90 BASE) MCG/ACT inhaler inhale 2 puffs every 6 hours for shortness of breath or wheezing 8.5 Inhaler 11  . promethazine (PHENERGAN) 12.5 MG tablet Take 1 tablet (12.5 mg total) by mouth every 6 (six) hours as needed for nausea or vomiting (Prior to pain medications). 45 tablet 3  . sodium chloride (OCEAN) 0.65 % SOLN nasal spray Place 1 spray into both nostrils as needed for congestion. 1 Bottle 0  . valsartan (DIOVAN) 320 MG tablet Take 1 tablet (320 mg total) by mouth daily. 30 tablet 11   No current facility-administered medications for this visit.   BP 122/66 mmHg  Pulse 88  Ht 5\' 4"  (1.626 m)  Wt 131 lb 12.8 oz (59.784 kg)  BMI 22.61 kg/m2  SpO2 98%  LMP 02/11/1975 General: NAD Neck: No JVD, no thyromegaly or thyroid nodule.  Lungs: Clear to auscultation bilaterally with normal  respiratory effort. CV: Nondisplaced PMI.  Heart regular S1/S2, no S3/S4, 2/6 early SEM with clear S2.  No peripheral edema.  No carotid bruit.  1+ PT bilaterally.   Abdomen: Soft, nontender, no hepatosplenomegaly, no distention.  Neurologic: Alert and oriented x 3.  Psych: Normal affect. Extremities: No clubbing or cyanosis.   Assessment/Plan: 1. Dyspnea: Cardiolite was normal in 10/16.  BNP was minimally elevated.  CXR was clear.  Hemoglobin normal on CBC. Echo was done and was unremarkable.  PFTs were normal.  She continues to have NYHA class III symptoms and is significantly limited.  Symptoms are new over a few weeks.  On exam, she is not  volume overloaded and lungs are clear.   - I am unclear as to what is causing her significant exertional symptoms.  She did, of note, have significant but non-flow limiting CAD on cardiac cath in 2013, including a 50% left main stenosis.  Therefore, I will plan left and right heart cath to assess for significant coronary disease and to assess left and right heart filling pressures and for pulmonary hypertension.  - Check TSH - Possible CPX versus pulmonary evaluation if cath unrevealing.  2. CAD: Non-flow limiting, moderate disease on cath in 4/13 including 50% distal LM. No ischemia or infarction on Lexiscan Cardiolite in 10/16.  However, still has significant exertional symptoms. - See discuss above, plan for coronary angiography.  - Continue Plavix (allergic to aspirin), Toprol XL, valsartan, Imdur. - She is on atorvastatin.   3. Hyperlipidemia: Repeat lipids next appointment.  4. HTN: BP controlled.   Followup in 2-3 weeks.   Loralie Champagne 09/22/2015

## 2015-09-26 ENCOUNTER — Telehealth: Payer: Self-pay | Admitting: *Deleted

## 2015-09-26 ENCOUNTER — Ambulatory Visit (HOSPITAL_COMMUNITY)
Admission: RE | Admit: 2015-09-26 | Discharge: 2015-09-26 | Disposition: A | Discharge status: Home / Self Care | Payer: Medicare Other | Source: Ambulatory Visit | Attending: Cardiology | Admitting: Cardiology

## 2015-09-26 ENCOUNTER — Telehealth: Payer: Self-pay

## 2015-09-26 ENCOUNTER — Encounter (HOSPITAL_COMMUNITY): Payer: Self-pay | Admitting: Cardiology

## 2015-09-26 ENCOUNTER — Encounter (HOSPITAL_COMMUNITY)
Admission: RE | Disposition: A | Discharge status: Home / Self Care | Payer: Self-pay | Source: Ambulatory Visit | Attending: Cardiology

## 2015-09-26 DIAGNOSIS — J449 Chronic obstructive pulmonary disease, unspecified: Secondary | ICD-10-CM | POA: Diagnosis not present

## 2015-09-26 DIAGNOSIS — R0609 Other forms of dyspnea: Principal | ICD-10-CM

## 2015-09-26 DIAGNOSIS — Z7902 Long term (current) use of antithrombotics/antiplatelets: Secondary | ICD-10-CM | POA: Diagnosis not present

## 2015-09-26 DIAGNOSIS — R0600 Dyspnea, unspecified: Secondary | ICD-10-CM

## 2015-09-26 DIAGNOSIS — E1122 Type 2 diabetes mellitus with diabetic chronic kidney disease: Secondary | ICD-10-CM | POA: Diagnosis not present

## 2015-09-26 DIAGNOSIS — E785 Hyperlipidemia, unspecified: Secondary | ICD-10-CM | POA: Insufficient documentation

## 2015-09-26 DIAGNOSIS — I129 Hypertensive chronic kidney disease with stage 1 through stage 4 chronic kidney disease, or unspecified chronic kidney disease: Secondary | ICD-10-CM | POA: Insufficient documentation

## 2015-09-26 DIAGNOSIS — N189 Chronic kidney disease, unspecified: Secondary | ICD-10-CM | POA: Insufficient documentation

## 2015-09-26 DIAGNOSIS — K219 Gastro-esophageal reflux disease without esophagitis: Secondary | ICD-10-CM | POA: Insufficient documentation

## 2015-09-26 DIAGNOSIS — Z8249 Family history of ischemic heart disease and other diseases of the circulatory system: Secondary | ICD-10-CM | POA: Diagnosis not present

## 2015-09-26 DIAGNOSIS — I6523 Occlusion and stenosis of bilateral carotid arteries: Secondary | ICD-10-CM | POA: Insufficient documentation

## 2015-09-26 DIAGNOSIS — I2510 Atherosclerotic heart disease of native coronary artery without angina pectoris: Secondary | ICD-10-CM | POA: Diagnosis not present

## 2015-09-26 DIAGNOSIS — E039 Hypothyroidism, unspecified: Secondary | ICD-10-CM | POA: Diagnosis not present

## 2015-09-26 DIAGNOSIS — M5430 Sciatica, unspecified side: Secondary | ICD-10-CM | POA: Insufficient documentation

## 2015-09-26 DIAGNOSIS — M79651 Pain in right thigh: Secondary | ICD-10-CM

## 2015-09-26 HISTORY — PX: CARDIAC CATHETERIZATION: SHX172

## 2015-09-26 LAB — GLUCOSE, CAPILLARY
GLUCOSE-CAPILLARY: 106 mg/dL — AB (ref 65–99)
Glucose-Capillary: 85 mg/dL (ref 65–99)

## 2015-09-26 LAB — POCT I-STAT 3, VENOUS BLOOD GAS (G3P V)
ACID-BASE DEFICIT: 2 mmol/L (ref 0.0–2.0)
Acid-base deficit: 1 mmol/L (ref 0.0–2.0)
BICARBONATE: 24.9 meq/L — AB (ref 20.0–24.0)
BICARBONATE: 25.8 meq/L — AB (ref 20.0–24.0)
O2 SAT: 60 %
O2 SAT: 60 %
PCO2 VEN: 50.8 mmHg — AB (ref 45.0–50.0)
PH VEN: 7.314 — AB (ref 7.250–7.300)
PO2 VEN: 34 mmHg (ref 30.0–45.0)
PO2 VEN: 35 mmHg (ref 30.0–45.0)
TCO2: 26 mmol/L (ref 0–100)
TCO2: 27 mmol/L (ref 0–100)
pCO2, Ven: 50.9 mmHg — ABNORMAL HIGH (ref 45.0–50.0)
pH, Ven: 7.298 (ref 7.250–7.300)

## 2015-09-26 SURGERY — RIGHT/LEFT HEART CATH AND CORONARY ANGIOGRAPHY
Anesthesia: LOCAL

## 2015-09-26 MED ORDER — HEPARIN (PORCINE) IN NACL 2-0.9 UNIT/ML-% IJ SOLN
INTRAMUSCULAR | Status: AC
  Filled 2015-09-26: qty 1000

## 2015-09-26 MED ORDER — HEPARIN SODIUM (PORCINE) 1000 UNIT/ML IJ SOLN
INTRAMUSCULAR | Status: AC
  Filled 2015-09-26: qty 1

## 2015-09-26 MED ORDER — FENTANYL CITRATE (PF) 100 MCG/2ML IJ SOLN
INTRAMUSCULAR | Status: AC
  Filled 2015-09-26: qty 4

## 2015-09-26 MED ORDER — ACETAMINOPHEN 325 MG PO TABS: 650.0000 mg | ORAL_TABLET | ORAL | Status: DC | PRN

## 2015-09-26 MED ORDER — SODIUM CHLORIDE 0.9 % IV SOLN: 250.0000 mL | INTRAVENOUS | Status: DC | PRN

## 2015-09-26 MED ORDER — SODIUM CHLORIDE 0.9 % WEIGHT BASED INFUSION: 1.0000 mL/kg/h | INTRAVENOUS | Status: DC

## 2015-09-26 MED ORDER — SODIUM CHLORIDE 0.9 % IJ SOLN: 3.0000 mL | INTRAMUSCULAR | Status: DC | PRN

## 2015-09-26 MED ORDER — SODIUM CHLORIDE 0.9 % IJ SOLN: 3.0000 mL | Freq: Two times a day (BID) | INTRAMUSCULAR | Status: DC

## 2015-09-26 MED ORDER — HEPARIN SODIUM (PORCINE) 1000 UNIT/ML IJ SOLN
INTRAMUSCULAR | Status: DC | PRN
  Administered 2015-09-26: 3000 [IU] via INTRAVENOUS

## 2015-09-26 MED ORDER — VERAPAMIL HCL 2.5 MG/ML IV SOLN
INTRAVENOUS | Status: AC
  Filled 2015-09-26: qty 2

## 2015-09-26 MED ORDER — LIDOCAINE HCL (PF) 1 % IJ SOLN
INTRAMUSCULAR | Status: DC | PRN
  Administered 2015-09-26: 10:00:00

## 2015-09-26 MED ORDER — LIDOCAINE HCL (PF) 1 % IJ SOLN
INTRAMUSCULAR | Status: AC
  Filled 2015-09-26: qty 30

## 2015-09-26 MED ORDER — ONDANSETRON HCL 4 MG/2ML IJ SOLN: 4.0000 mg | Freq: Four times a day (QID) | INTRAMUSCULAR | Status: DC | PRN

## 2015-09-26 MED ORDER — FENTANYL CITRATE (PF) 100 MCG/2ML IJ SOLN
INTRAMUSCULAR | Status: DC | PRN
  Administered 2015-09-26 (×2): 25 ug via INTRAVENOUS

## 2015-09-26 MED ORDER — SODIUM CHLORIDE 0.9 % WEIGHT BASED INFUSION
3.0000 mL/kg/h | INTRAVENOUS | Status: AC
  Administered 2015-09-26: 3 mL/kg/h via INTRAVENOUS

## 2015-09-26 MED ORDER — PROMETHAZINE HCL 12.5 MG PO TABS: 12.5000 mg | ORAL_TABLET | Freq: Four times a day (QID) | ORAL | Status: DC | PRN

## 2015-09-26 MED ORDER — MIDAZOLAM HCL 2 MG/2ML IJ SOLN
INTRAMUSCULAR | Status: AC
  Filled 2015-09-26: qty 4

## 2015-09-26 MED ORDER — VERAPAMIL HCL 2.5 MG/ML IV SOLN
INTRAVENOUS | Status: DC | PRN
  Administered 2015-09-26: 10 mL via INTRA_ARTERIAL

## 2015-09-26 MED ORDER — IOHEXOL 350 MG/ML SOLN
INTRAVENOUS | Status: DC | PRN
  Administered 2015-09-26: 95 mL via INTRAVENOUS

## 2015-09-26 MED ORDER — SODIUM CHLORIDE 0.9 % WEIGHT BASED INFUSION: 3.0000 mL/kg/h | INTRAVENOUS | Status: AC

## 2015-09-26 MED ORDER — MIDAZOLAM HCL 2 MG/2ML IJ SOLN
INTRAMUSCULAR | Status: DC | PRN
  Administered 2015-09-26 (×2): 1 mg via INTRAVENOUS

## 2015-09-26 SURGICAL SUPPLY — 19 items
CATH BALLN WEDGE 5F 110CM (CATHETERS) ×2 IMPLANT
CATH INFINITI 5 FR JL3.5 (CATHETERS) ×2 IMPLANT
CATH INFINITI 5FR ANG PIGTAIL (CATHETERS) ×2 IMPLANT
CATH INFINITI JR4 5F (CATHETERS) ×2 IMPLANT
CATH LAUNCHER 5F 3DRIGHT (CATHETERS) IMPLANT
CATHETER LAUNCHER 5F 3DRIGHT (CATHETERS) ×2
DEVICE RAD COMP TR BAND LRG (VASCULAR PRODUCTS) ×2 IMPLANT
GLIDESHEATH SLEND SS 6F .021 (SHEATH) ×2 IMPLANT
GUIDEWIRE .025 260CM (WIRE) ×1 IMPLANT
GUIDEWIRE ANGLED .035X260CM (WIRE) ×1 IMPLANT
KIT HEART LEFT (KITS) ×2 IMPLANT
KIT HEART RIGHT NAMIC (KITS) ×2 IMPLANT
PACK CARDIAC CATHETERIZATION (CUSTOM PROCEDURE TRAY) ×2 IMPLANT
SHEATH FAST CATH BRACH 5F 5CM (SHEATH) ×2 IMPLANT
SYR MEDRAD MARK V 150ML (SYRINGE) ×2 IMPLANT
TRANSDUCER W/STOPCOCK (MISCELLANEOUS) ×3 IMPLANT
TUBING CIL FLEX 10 FLL-RA (TUBING) ×2 IMPLANT
WIRE HI TORQ VERSACORE-J 145CM (WIRE) ×2 IMPLANT
WIRE SAFE-T 1.5MM-J .035X260CM (WIRE) ×2 IMPLANT

## 2015-09-26 NOTE — Telephone Encounter (Signed)
Larey Dresser, MD   Sent: Tue September 26, 2015 10:47 AM    To: Katrine Coho, RN        Message     Webb Silversmith, please arrange for Mrs Masse to get a CPX to assess dyspnea.

## 2015-09-26 NOTE — Interval H&P Note (Signed)
Cath Lab Visit (complete for each Cath Lab visit)  Clinical Evaluation Leading to the Procedure:   ACS: Yes.    Non-ACS:    Anginal Classification: CCS III  Anti-ischemic medical therapy: Minimal Therapy (1 class of medications)  Non-Invasive Test Results: No non-invasive testing performed  Prior CABG: No previous CABG      History and Physical Interval Note:  09/26/2015 9:04 AM  Christine [name]  has presented today for surgery, with the diagnosis of chf, sob  The various methods of treatment have been discussed with the patient and family. After consideration of risks, benefits and other options for treatment, the patient has consented to  Procedure(s): Right/Left Heart Cath and Coronary Angiography (N/A) as a surgical intervention .  The patient's history has been reviewed, patient examined, no change in status, stable for surgery.  I have reviewed the patient's chart and labs.  Questions were answered to the patient's satisfaction.     Christine [name] [employer]

## 2015-09-26 NOTE — H&P (View-Only) (Signed)
[name], female   DOB: 01/24/1934, 81 y.o.   MRN: [id number] PCP: Dr. Daryll Drown  81 yo with history of CAD, HTN, and diabetes presents for cardiology followup.  I initially saw Christine [name] in the hospital in 10/12 with chest pain.  Left heart cath showed a 40-50% distal left main stenosis that was not thought to be flow-limiting.  She additionally had a 75% stenosis in a diagonal.  Chest pain resolved with medical therapy.  She was admitted again in 4/13 with chest pain and exertional dyspnea.  Cardiac enzymes were negative.  LHC looked similar to prior with 50% distal LM, 70% ostial D1, and 70% ostial ramus.  Echo showed preserved EF.  A dobutamine stress echo was done, this was submaximal but did not show evidence for ischemia.  No intervention, planned medical management.   Anti-anginal meds were titrated up and she was discharged home.  She developed chest pain (atypical) again in 2014.  Lexiscan Cardiolite was normal in 3/14.    Christine [name] went to the ER in 10/16 with dyspnea.  CXR showed clear lungs and BNP was 104.  Lexiscan Cardiolite was done, no ischemia or infarction.  She continues to have exertional dyspnea.  She is short of breath walking about 50-100 feet if she walks fast.  She is short of breath with showering or housework.  She has significantly limiting her activity and is not even going to church.  She has occasional mild, atypical left-sided chest pain.  This does not tend to occur with exertion.  No lightheadedness.  No orthopnea/PND.  Echo was done and was unremarkable.  PFTs were normal.     Labs (2/13): K 3.3, creatinine 0.9 Labs (3/13): HDL 42, LDL 49 Labs (4/13): K 4.1, creatinine 1.01 Labs (10/13): LDL 56, HDL 38 Labs (2/14): K 3.8, creatinine 0.98 Labs (7/14): K 4.4, creatinine 1.17 Labs (12/14): LDL 75, HDL 51 Labs (9/15): K 3.7, creatinine 1.09, LDL 67, HDL 35 Labs (2/16): K 3.9, creatinine 1.07 Labs (10/16): K 3.9, creatinine 1.08, BNP 104, HCT  38.4  PMH: 1. CAD: Chest pain 10/12.  LHC with 40-50% distal LM stenosis, 70-75% ostial D1.  No flow-limiting lesions, medically managed.  Echo (10/12): EF 60-65%, mild LV hypertrophy, PA systolic pressure 24 mmHg.  Chest pain 4/13.  LHC with 50% distal LM, 70% ostial D1, 50% proximal LAD, 70% proximal ramus.  No flow-limiting lesions, medically managed.  Echo (4/13) with EF 60-65%, mild LVH.  DSE (4/13) with no ischemia but it was a submaximal study.  Lexiscan Cardiolite (3/14) with EF 88%, no ischemia or infarction.  Lexiscan Cardiolite (10/16) with EF 82%, no ischemia/infarction.  Echo (11/16) with EF 60-65%, moderate LVH.  2. DM2 3. HTN 4. Hyperlipidemia 5. CKD 6. Carotid dopplers (10/12): 0-39% bilateral carotid stenosis.  7. Allergy to aspirin => rash.  8. GERD 9. Migraines 10. Hypothyroidism 11. COPD 27. PMR: Dr. Charlestine Night.  13. ABIs (4/15): Normal 14. Sciatica 15. PFTs (10/16): Normal.   SH: Nonsmoker.  Lives in [place].   FH: CAD  Current Outpatient Prescriptions  Medication Sig Dispense Refill  . acetaminophen (TYLENOL) 500 MG tablet Take 1,000 mg by mouth every 6 (six) hours as needed for headache. For pain    . ADVAIR DISKUS 250-50 MCG/DOSE AEPB inhale 1 dose by mouth every 12 hours 60 each 11  . atorvastatin (LIPITOR) 40 MG tablet Take 1 tablet (40 mg total) by mouth every evening. 90 tablet 3  . clopidogrel (  PLAVIX) 75 MG tablet take 1 tablet by mouth once daily 30 tablet 5  . esomeprazole (NEXIUM) 40 MG capsule take 1 capsule by mouth once daily BEFORE BREAKFAST 30 capsule 6  . glimepiride (AMARYL) 1 MG tablet take 1 tablet by mouth once daily BEFORE BREAKFAST 90 tablet 3  . glucose blood (ONE TOUCH ULTRA TEST) test strip 1 each by Other route as needed for other. Use 1 to 2 times daily to check blood sugar. diag code 250.02. Non-insulin dependent 200 each 4  . guaiFENesin (MUCINEX) 600 MG 12 hr tablet Take 600 mg by mouth 2 (two) times daily.    . hydroxypropyl  methylcellulose (ISOPTO TEARS) 2.5 % ophthalmic solution Place 1 drop into both eyes 4 (four) times daily as needed for dry eyes.    Marland Kitchen imipramine (TOFRANIL) 50 MG tablet take 1 tablet by mouth twice a day 60 tablet 6  . isosorbide mononitrate (IMDUR) 60 MG 24 hr tablet take 2 tablets by mouth once daily 60 tablet 6  . levothyroxine (SYNTHROID, LEVOTHROID) 25 MCG tablet take 1 tablet by mouth once daily 90 tablet 1  . loratadine (CLARITIN) 10 MG tablet Take 1 tablet (10 mg total) by mouth daily. 30 tablet 6  . metoprolol succinate (TOPROL-XL) 100 MG 24 hr tablet Take 1 tablet (100 mg total) by mouth daily. Take with or immediately following a meal. 90 tablet 4  . Multiple Vitamin (MULITIVITAMIN WITH MINERALS) TABS Take 1 tablet by mouth daily.    Marland Kitchen NITROSTAT 0.4 MG SL tablet place 1 tablet under the tongue if needed every 5 minutes for chest pain for 3 doses IF NO RELIEF AFTER 3RD DOSE CALL PRESCRIBER OR 911. 25 tablet 3  . ONETOUCH DELICA LANCETS 54S MISC Inject 1 each into the skin as needed. Use 1 to 2 times daily to check blood sugar. diag code 250.02. Non-insulin dependent 200 each 4  . PROAIR HFA 108 (90 BASE) MCG/ACT inhaler inhale 2 puffs every 6 hours for shortness of breath or wheezing 8.5 Inhaler 11  . promethazine (PHENERGAN) 12.5 MG tablet Take 1 tablet (12.5 mg total) by mouth every 6 (six) hours as needed for nausea or vomiting (Prior to pain medications). 45 tablet 3  . sodium chloride (OCEAN) 0.65 % SOLN nasal spray Place 1 spray into both nostrils as needed for congestion. 1 Bottle 0  . valsartan (DIOVAN) 320 MG tablet Take 1 tablet (320 mg total) by mouth daily. 30 tablet 11   No current facility-administered medications for this visit.   BP 122/66 mmHg  Pulse 88  Ht 5\' 4"  (1.626 m)  Wt 131 lb 12.8 oz (59.784 kg)  BMI 22.61 kg/m2  SpO2 98%  LMP 02/11/1975 General: NAD Neck: No JVD, no thyromegaly or thyroid nodule.  Lungs: Clear to auscultation bilaterally with normal  respiratory effort. CV: Nondisplaced PMI.  Heart regular S1/S2, no S3/S4, 2/6 early SEM with clear S2.  No peripheral edema.  No carotid bruit.  1+ PT bilaterally.   Abdomen: Soft, nontender, no hepatosplenomegaly, no distention.  Neurologic: Alert and oriented x 3.  Psych: Normal affect. Extremities: No clubbing or cyanosis.   Assessment/Plan: 1. Dyspnea: Cardiolite was normal in 10/16.  BNP was minimally elevated.  CXR was clear.  Hemoglobin normal on CBC. Echo was done and was unremarkable.  PFTs were normal.  She continues to have NYHA class III symptoms and is significantly limited.  Symptoms are new over a few weeks.  On exam, she is not  volume overloaded and lungs are clear.   - I am unclear as to what is causing her significant exertional symptoms.  She did, of note, have significant but non-flow limiting CAD on cardiac cath in 2013, including a 50% left main stenosis.  Therefore, I will plan left and right heart cath to assess for significant coronary disease and to assess left and right heart filling pressures and for pulmonary hypertension.  - Check TSH - Possible CPX versus pulmonary evaluation if cath unrevealing.  2. CAD: Non-flow limiting, moderate disease on cath in 4/13 including 50% distal LM. No ischemia or infarction on Lexiscan Cardiolite in 10/16.  However, still has significant exertional symptoms. - See discuss above, plan for coronary angiography.  - Continue Plavix (allergic to aspirin), Toprol XL, valsartan, Imdur. - She is on atorvastatin.   3. Hyperlipidemia: Repeat lipids next appointment.  4. HTN: BP controlled.   Followup in 2-3 weeks.   Loralie Champagne 09/22/2015

## 2015-09-26 NOTE — Progress Notes (Signed)
Site area: right ac venous sheath Site Prior to Removal:  Level  0 Pressure Applied For:  15 minutes Manual:   yes Patient Status During Pull:  stable Post Pull Site:  Level  0 Post Pull Instructions Given:  yes Post Pull Pulses Present: yes Dressing Applied:  Small tegaderm Bedrest begins @  2883 Comments: 0

## 2015-09-26 NOTE — Telephone Encounter (Signed)
Prior auth for Phenergan (Promethazine) sent to [facility].

## 2015-09-26 NOTE — Discharge Instructions (Signed)
Radial Site Care °Refer to this sheet in the next few weeks. These instructions provide you with information about caring for yourself after your procedure. Your health care provider may also give you more specific instructions. Your treatment has been planned according to current medical practices, but problems sometimes occur. Call your health care provider if you have any problems or questions after your procedure. °WHAT TO EXPECT AFTER THE PROCEDURE °After your procedure, it is typical to have the following: °· Bruising at the radial site that usually fades within 1-2 weeks. °· Blood collecting in the tissue (hematoma) that may be painful to the touch. It should usually decrease in size and tenderness within 1-2 weeks. °HOME CARE INSTRUCTIONS °· Take medicines only as directed by your health care provider. °· You may shower 24-48 hours after the procedure or as directed by your health care provider. Remove the bandage (dressing) and gently wash the site with plain soap and water. Pat the area dry with a clean towel. Do not rub the site, because this may cause bleeding. °· Do not take baths, swim, or use a hot tub until your health care provider approves. °· Check your insertion site every day for redness, swelling, or drainage. °· Do not apply powder or lotion to the site. °· Do not flex or bend the affected arm for 24 hours or as directed by your health care provider. °· Do not push or pull heavy objects with the affected arm for 24 hours or as directed by your health care provider. °· Do not lift over 10 lb (4.5 kg) for 5 days after your procedure or as directed by your health care provider. °· Ask your health care provider when it is okay to: °¨ Return to work or school. °¨ Resume usual physical activities or sports. °¨ Resume sexual activity. °· Do not drive home if you are discharged the same day as the procedure. Have someone else drive you. °· You may drive 24 hours after the procedure unless otherwise  instructed by your health care provider. °· Do not operate machinery or power tools for 24 hours after the procedure. °· If your procedure was done as an outpatient procedure, which means that you went home the same day as your procedure, a responsible adult should be with you for the first 24 hours after you arrive home. °· Keep all follow-up visits as directed by your health care provider. This is important. °SEEK MEDICAL CARE IF: °· You have a fever. °· You have chills. °· You have increased bleeding from the radial site. Hold pressure on the site. °SEEK IMMEDIATE MEDICAL CARE IF: °· You have unusual pain at the radial site. °· You have redness, warmth, or swelling at the radial site. °· You have drainage (other than a small amount of blood on the dressing) from the radial site. °· The radial site is bleeding, and the bleeding does not stop after 30 minutes of holding steady pressure on the site. °· Your arm or hand becomes pale, cool, tingly, or numb. °  °This information is not intended to replace advice given to you by your health care provider. Make sure you discuss any questions you have with your health care provider. °  °Document Released: 12/07/2010 Document Revised: 11/25/2014 Document Reviewed: 05/23/2014 °Elsevier Interactive Patient Education ©2016 [employer] ° °

## 2015-09-27 ENCOUNTER — Ambulatory Visit: Payer: Medicare Other | Admitting: Physician Assistant

## 2015-09-27 ENCOUNTER — Encounter: Payer: Medicare Other | Admitting: Internal Medicine

## 2015-09-27 ENCOUNTER — Encounter: Payer: Self-pay | Admitting: Internal Medicine

## 2015-09-28 ENCOUNTER — Telehealth: Payer: Self-pay | Admitting: Cardiology

## 2015-09-28 MED ORDER — HYDROCODONE-ACETAMINOPHEN 5-300 MG PO TABS: 1.0000 | ORAL_TABLET | Freq: Four times a day (QID) | ORAL | Status: DC | PRN

## 2015-09-28 NOTE — Telephone Encounter (Signed)
A prescription for Hydrocodone 5/300 mg written take one tablet by mouth every 6 hours as needed for pain. Dispense 10 pills with no refill.   prescription sign by Ellen Henri PA. Pt is aware to come to the office to peak up the prescription at the front desk.

## 2015-09-28 NOTE — Telephone Encounter (Signed)
New Message  Pt requested to speak w/ RN concerning her recent Cath. Please call back and discuss.

## 2015-09-28 NOTE — Telephone Encounter (Signed)
Per Dr. Aundra Dubin a Rx for Hydrocodone was written for carpal tunnel until she is seen by her rheumatologist  On 10/02/15.   5/300 #15 no refills.   Lenya Sterne

## 2015-09-28 NOTE — Telephone Encounter (Signed)
That would be fine but I am not in the office to sign a prescription, can you get one of the extenders to sign?

## 2015-09-28 NOTE — Telephone Encounter (Signed)
Pt states that her right hand is painful and swollen so much that she can't closed it or make a fist. According to pt she was prescribed   Tramadol medication by Dr. Aundra Dubin for her arthritis pain, and is not working.  This medication  is making her sick at her stomach. Pt states she gets Cortizone injections with her rheumatologist for her arthritis  Carpal tonel  of right  Hand. Pt  called her   rheumatologist's office today the office   will be closed tomorrow (Friday 11/11) and will reopen Monday 11/14. Pt would like to know if Dr Aundra Dubin can order a few pills of Hydrocodone to last   till Monday when she see her Rheumatologist. Pt  asked to Cancell the CPK for Monday.

## 2015-10-02 ENCOUNTER — Encounter (HOSPITAL_COMMUNITY): Payer: Medicare Other

## 2015-10-03 DIAGNOSIS — G5601 Carpal tunnel syndrome, right upper limb: Secondary | ICD-10-CM | POA: Diagnosis not present

## 2015-10-03 DIAGNOSIS — M19042 Primary osteoarthritis, left hand: Secondary | ICD-10-CM | POA: Diagnosis not present

## 2015-10-03 DIAGNOSIS — Z79899 Other long term (current) drug therapy: Secondary | ICD-10-CM | POA: Diagnosis not present

## 2015-10-03 DIAGNOSIS — M65331 Trigger finger, right middle finger: Secondary | ICD-10-CM | POA: Diagnosis not present

## 2015-10-03 DIAGNOSIS — M19041 Primary osteoarthritis, right hand: Secondary | ICD-10-CM | POA: Diagnosis not present

## 2015-10-04 ENCOUNTER — Telehealth: Payer: Self-pay | Admitting: Cardiology

## 2015-10-04 NOTE — Telephone Encounter (Signed)
LMTCB 10-04-15

## 2015-10-04 NOTE — Telephone Encounter (Signed)
New message      Pt states she was to call the nurse to get "information".

## 2015-10-05 ENCOUNTER — Other Ambulatory Visit: Payer: Self-pay | Admitting: Internal Medicine

## 2015-10-05 NOTE — Telephone Encounter (Signed)
Pt states she is ready to reschedule CPX stress test that was scheduled for 10/02/15. Pt advised I will forward to [facility] to contact her to reschedule.

## 2015-10-06 ENCOUNTER — Other Ambulatory Visit: Payer: Self-pay | Admitting: Internal Medicine

## 2015-10-06 NOTE — Telephone Encounter (Signed)
She [place] overdue for an appt wiwth Dr Marylyn Ishihara. Pls tell her to sch an appt.  There is no D/C summary nor D/C med list from recent admission. I reviewed EPIC med list and med list on the two hospital note with meds and her metoprolol had not changed,. But I encourage her to call cards to ensure it was not changed and just not updated in EPIC.

## 2015-10-09 NOTE — Telephone Encounter (Signed)
Pt scheduled for 12/21 with Dr. Daryll Drown

## 2015-10-11 ENCOUNTER — Encounter: Payer: Self-pay | Admitting: *Deleted

## 2015-10-11 DIAGNOSIS — H0014 Chalazion left upper eyelid: Secondary | ICD-10-CM | POA: Diagnosis not present

## 2015-10-16 ENCOUNTER — Telehealth: Payer: Self-pay | Admitting: Cardiology

## 2015-10-16 ENCOUNTER — Telehealth: Payer: Self-pay | Admitting: Internal Medicine

## 2015-10-16 NOTE — Telephone Encounter (Signed)
Pt requesting her Test strips from [facility] to be filled  Form must be completed and faxed back in .  Paperwork is in Dr [employer].

## 2015-10-16 NOTE — Telephone Encounter (Signed)
Ok, she knows to call me.

## 2015-10-16 NOTE — Telephone Encounter (Signed)
Will take care of it and sign.  Thanks

## 2015-10-16 NOTE — Telephone Encounter (Signed)
Pt requesting to cancel appt with Dr Aundra Dubin scheduled for 10/18/15. Pt states she has been unable to have CPX done due to other medical appts, having eye problems at this time.  Pt states she will call to reschedule CPX. Pt advised once she has CPX done and  Dr Aundra Dubin has reviewed the result, I will ask Dr Aundra Dubin about timing of follow up appt with him.  Pt states her shortness of breath is unchanged.

## 2015-10-16 NOTE — Telephone Encounter (Signed)
New Message     Pt stating that she hasn't had her "other test done yet" and wants to know if she still needs her appt w/ Dr. Aundra Dubin since she hasn't had the test done. Please call back and advise.

## 2015-10-16 NOTE — Telephone Encounter (Signed)
Have her reschedule appointment with me once CPX has been done.

## 2015-10-16 NOTE — Telephone Encounter (Signed)
Please advise 

## 2015-10-18 ENCOUNTER — Ambulatory Visit: Payer: Medicare Other | Admitting: Cardiology

## 2015-10-20 ENCOUNTER — Other Ambulatory Visit: Payer: Self-pay | Admitting: Internal Medicine

## 2015-10-20 MED ORDER — GLUCOSE BLOOD VI STRP: 1.0000 | ORAL_STRIP | Status: DC | PRN

## 2015-10-23 ENCOUNTER — Ambulatory Visit (INDEPENDENT_AMBULATORY_CARE_PROVIDER_SITE_OTHER): Payer: Medicare Other | Admitting: Internal Medicine

## 2015-10-23 ENCOUNTER — Encounter: Payer: Self-pay | Admitting: Internal Medicine

## 2015-10-23 VITALS — BP 142/80 | HR 78 | Temp 98.1°F | Wt 135.5 lb

## 2015-10-23 DIAGNOSIS — E119 Type 2 diabetes mellitus without complications: Secondary | ICD-10-CM | POA: Diagnosis present

## 2015-10-23 DIAGNOSIS — Z7984 Long term (current) use of oral hypoglycemic drugs: Secondary | ICD-10-CM | POA: Diagnosis not present

## 2015-10-23 DIAGNOSIS — R0609 Other forms of dyspnea: Secondary | ICD-10-CM | POA: Diagnosis not present

## 2015-10-23 DIAGNOSIS — E1165 Type 2 diabetes mellitus with hyperglycemia: Principal | ICD-10-CM

## 2015-10-23 DIAGNOSIS — IMO0001 Reserved for inherently not codable concepts without codable children: Secondary | ICD-10-CM

## 2015-10-23 DIAGNOSIS — R0600 Dyspnea, unspecified: Secondary | ICD-10-CM

## 2015-10-23 LAB — POCT GLYCOSYLATED HEMOGLOBIN (HGB A1C): Hemoglobin A1C: 6.8

## 2015-10-23 LAB — GLUCOSE, CAPILLARY: Glucose-Capillary: 178 mg/dL — ABNORMAL HIGH (ref 65–99)

## 2015-10-23 NOTE — Progress Notes (Signed)
   Subjective:    [name], female    DOB: 01/24/1934, 81 y.o.   MRN: [id number]  CC: Routine follow up for DM, 6 months  HPI  [name] is here for review of her DM.  She has been having trouble getting strips and would like to have them refilled.    She reports that she has been going back and forth to the Cardiologist trying to figure out why she is so short of breath.  She is frustrated b/c she has not been able to be active.  Going on for about 3 months so far. She has been following with her Cardiologist Dr. Aundra Dubin.  She notes dizziness associated with he shortness of breath, but no chest pain or cough.  No sputum production.    For her work up, she has had a lexiscan which showed no ischemia, a TTE which showed normal EF and diastolic function, PFTs which showed small airway disease (would not explain current extent of symptoms), Hgb relatively normal, LHC with known stenosis which is chronic and unchanged, TSH normal.  Cardiology team will do a cardiopulmonary exercise stress test (CPX) tomorrow per patient.    As concerns her DM, she has no concerning issues today.  She is more fatigued, but denies nausea, vomiting, polyuria.  She brought in her BS log which showed glucose averaging in the 90s - 100s.  She is not having any lows.  Denies any tremors.    She is not smoking  Review of Systems  Constitutional: Positive for activity change and fatigue. Negative for fever and chills.  Respiratory: Positive for shortness of breath. Negative for cough and chest tightness.   Cardiovascular: Negative for chest pain.  Gastrointestinal: Negative for nausea, vomiting and diarrhea.  Genitourinary: Negative for dysuria and difficulty urinating.  Neurological: Positive for dizziness. Negative for weakness and light-headedness.       Objective:   Physical Exam  Constitutional: She is oriented to person, place, and time. No distress.  Thin woman, appears fatigued  HENT:   Head: Normocephalic and atraumatic.  Cardiovascular: Normal rate, regular rhythm and normal heart sounds.   No murmur heard. Pulmonary/Chest: Effort normal and breath sounds normal. No respiratory distress. She has no wheezes.  Musculoskeletal: She exhibits no edema or tenderness.  Neurological: She is alert and oriented to person, place, and time.  Skin: Skin is warm and dry.   A1C 6.8 today.      Assessment & Plan:  RTC as previously scheduled.

## 2015-10-23 NOTE — Assessment & Plan Note (Addendum)
Work up has been non revealing so far.  If CPX not revealing, would consider CT scan of the chest vs. VQ scan for chronic PE as further work up.  Possible need for pulmonary evaluation and RHC.  She does seem to be progressively getting worse based on my exam today.   Follow up after she sees Cardiology.

## 2015-10-23 NOTE — Assessment & Plan Note (Signed)
Lab Results  Component Value Date   HGBA1C 6.8 10/23/2015   HGBA1C 6.5 04/05/2015   HGBA1C 6.8 12/21/2014     Assessment: Comments: Doing well, form filled out for strips  Plan: Medications:  continue current medications, glimepiride Instruction/counseling given: reminded to bring blood glucose meter & log to each visit and reminded to bring medications to each visit Educational resources provided:   Self management tools provided: copy of home glucose meter download

## 2015-10-24 ENCOUNTER — Ambulatory Visit (HOSPITAL_COMMUNITY): Payer: Medicare Other | Attending: Cardiology

## 2015-10-24 DIAGNOSIS — R0609 Other forms of dyspnea: Secondary | ICD-10-CM | POA: Diagnosis not present

## 2015-10-24 DIAGNOSIS — R0600 Dyspnea, unspecified: Secondary | ICD-10-CM | POA: Diagnosis not present

## 2015-10-31 ENCOUNTER — Telehealth: Payer: Self-pay | Admitting: Cardiology

## 2015-10-31 NOTE — Telephone Encounter (Signed)
Probably 4-6 months

## 2015-10-31 NOTE — Telephone Encounter (Signed)
Pt advised,verbalized understanding. 

## 2015-10-31 NOTE — Telephone Encounter (Signed)
When do you want to see her back?

## 2015-10-31 NOTE — Telephone Encounter (Signed)
Mild functional limitation compared to age-matched sedentary normals related to her heart. Appears that deconditioning is the major cause of her symptoms. Needs to start exercise program.

## 2015-10-31 NOTE — Telephone Encounter (Signed)
°  New Prob   Pt is requesting to speak to a nurse regarding her test results. Please call.

## 2015-10-31 NOTE — Telephone Encounter (Signed)
Pt calling for CPX results done 10/24/15. Pt states a follow up appt with Dr Aundra Dubin was going to be determined by the results of the CPX.  Pt advised I will forward to Dr Aundra Dubin for review.

## 2015-11-08 ENCOUNTER — Encounter: Payer: Self-pay | Admitting: Internal Medicine

## 2015-11-08 ENCOUNTER — Ambulatory Visit (INDEPENDENT_AMBULATORY_CARE_PROVIDER_SITE_OTHER): Payer: Medicare Other | Admitting: Internal Medicine

## 2015-11-08 ENCOUNTER — Telehealth: Payer: Self-pay | Admitting: *Deleted

## 2015-11-08 VITALS — BP 137/59 | HR 82 | Temp 98.0°F | Ht 65.0 in | Wt 137.3 lb

## 2015-11-08 DIAGNOSIS — I2510 Atherosclerotic heart disease of native coronary artery without angina pectoris: Secondary | ICD-10-CM

## 2015-11-08 DIAGNOSIS — N183 Chronic kidney disease, stage 3 unspecified: Secondary | ICD-10-CM

## 2015-11-08 DIAGNOSIS — M8580 Other specified disorders of bone density and structure, unspecified site: Secondary | ICD-10-CM

## 2015-11-08 DIAGNOSIS — R0600 Dyspnea, unspecified: Secondary | ICD-10-CM

## 2015-11-08 DIAGNOSIS — G5603 Carpal tunnel syndrome, bilateral upper limbs: Secondary | ICD-10-CM

## 2015-11-08 DIAGNOSIS — R0609 Other forms of dyspnea: Secondary | ICD-10-CM | POA: Diagnosis not present

## 2015-11-08 DIAGNOSIS — E119 Type 2 diabetes mellitus without complications: Secondary | ICD-10-CM | POA: Diagnosis not present

## 2015-11-08 DIAGNOSIS — R911 Solitary pulmonary nodule: Secondary | ICD-10-CM | POA: Diagnosis not present

## 2015-11-08 DIAGNOSIS — N189 Chronic kidney disease, unspecified: Secondary | ICD-10-CM | POA: Diagnosis not present

## 2015-11-08 DIAGNOSIS — J302 Other seasonal allergic rhinitis: Secondary | ICD-10-CM | POA: Diagnosis not present

## 2015-11-08 DIAGNOSIS — Z23 Encounter for immunization: Secondary | ICD-10-CM | POA: Diagnosis not present

## 2015-11-08 DIAGNOSIS — I10 Essential (primary) hypertension: Secondary | ICD-10-CM

## 2015-11-08 DIAGNOSIS — G43809 Other migraine, not intractable, without status migrainosus: Secondary | ICD-10-CM

## 2015-11-08 DIAGNOSIS — K5790 Diverticulosis of intestine, part unspecified, without perforation or abscess without bleeding: Secondary | ICD-10-CM

## 2015-11-08 DIAGNOSIS — I6529 Occlusion and stenosis of unspecified carotid artery: Secondary | ICD-10-CM

## 2015-11-08 DIAGNOSIS — M898X8 Other specified disorders of bone, other site: Secondary | ICD-10-CM

## 2015-11-08 DIAGNOSIS — J449 Chronic obstructive pulmonary disease, unspecified: Secondary | ICD-10-CM | POA: Diagnosis present

## 2015-11-08 DIAGNOSIS — I2583 Coronary atherosclerosis due to lipid rich plaque: Secondary | ICD-10-CM

## 2015-11-08 DIAGNOSIS — M89319 Hypertrophy of bone, unspecified shoulder: Secondary | ICD-10-CM

## 2015-11-08 LAB — GLUCOSE, CAPILLARY: Glucose-Capillary: 146 mg/dL — ABNORMAL HIGH (ref 65–99)

## 2015-11-08 MED ORDER — FLUTICASONE PROPIONATE 50 MCG/ACT NA SUSP: 1.0000 | Freq: Every day | NASAL | Status: DC

## 2015-11-08 MED ORDER — GLUCOSE BLOOD VI STRP: 1.0000 | ORAL_STRIP | Status: DC | PRN

## 2015-11-08 NOTE — Assessment & Plan Note (Signed)
BP today well controlled at 137/59.  She has no issues.  She does have intermittent chest pains which are explained by stable angina and her known CAD.   Plan:  Continue metoprolol and valsartan.

## 2015-11-08 NOTE — Assessment & Plan Note (Signed)
I asked her more about her migraines today.  She reports that back about 30 years ago, after she had her children, she developed dizziness.  She was told at the time that she had an "imbalance" and needed to be started on imipramine (I wonder if she had some depression at that time?).  She was on it for years, however, a physician attempted to wean her off of it about 26 years ago.  She was only off it for 3 days when she developed a severely debilitating migraine and she was restarted.  She has not stopped the medication since that time and reports no further headaches.  She is resistant to discussion about stopping the medication given this event.   Plan:  Continue imipramine for migraine ppx.

## 2015-11-08 NOTE — Assessment & Plan Note (Signed)
This was noted on DEXA scan in 2014.  She is taking a multivitamin.  Will consider repeating DEXA given 2 years since last checked.  I am not clear how much calcium and Vitamin D she is taking in.    Plan: Discuss repeat DEXA at next visit.

## 2015-11-08 NOTE — Patient Instructions (Signed)
Christine [name] - -   It was very good to see you today!  For your runny nose and congestion, please try flonase (fluticasone) nasal spray daily while you have symptoms.  More information is below.   Please continue your other medications.   For your lungs, I would like you to repeat a CT scan of your chest and lungs to follow up on a pulmonary nodule that was seen a few years ago.  More information is below.    Please come back to see me in 3-4 months, sooner if you need to.    Pulmonary Nodule A pulmonary nodule is a small, round growth of tissue in the lung. Pulmonary nodules can range in size from less than 1/5 inch (4 mm) to a little bigger than an inch (25 mm). Most pulmonary nodules are detected when imaging tests of the lung are being performed for a different problem. Pulmonary nodules are usually not cancerous (benign). However, some pulmonary nodules are cancerous (malignant). Follow-up treatment or testing is based on the size of the pulmonary nodule and your risk of getting lung cancer.  CAUSES Benign pulmonary nodules can be caused by various things. Some of the causes include:   Bacterial, fungal, or viral infections. This is usually an old infection that is no longer active, but it can sometimes be a current, active infection.  A benign mass of tissue.  Inflammation from conditions such as rheumatoid arthritis.   Abnormal blood vessels in the lungs. Malignant pulmonary nodules can result from lung cancer or from cancers that spread to the lung from other places in the body. SIGNS AND SYMPTOMS Pulmonary nodules usually do not cause symptoms. DIAGNOSIS Most often, pulmonary nodules are found incidentally when an X-ray or CT scan is performed to look for some other problem in the lung area. To help determine whether a pulmonary nodule is benign or malignant, your health care provider will take a medical history and order a variety of tests. Tests done may include:   Blood  tests.  A skin test called a tuberculin test. This test is used to determine if you have been exposed to the germ that causes tuberculosis.   Chest X-rays. If possible, a new X-ray may be compared with X-rays you have had in the past.   CT scan. This test shows smaller pulmonary nodules more clearly than an X-ray.   Positron emission tomography (PET) scan. In this test, a safe amount of a radioactive substance is injected into the bloodstream. Then, the scan takes a picture of the pulmonary nodule. The radioactive substance is eliminated from your body in your urine.   Biopsy. A tiny piece of the pulmonary nodule is removed so it can be checked under a microscope. TREATMENT  Pulmonary nodules that are benign normally do not require any treatment because they usually do not cause symptoms or breathing problems. Your health care provider may want to monitor the pulmonary nodule through follow-up CT scans. The frequency of these CT scans will vary based on the size of the nodule and the risk factors for lung cancer. For example, CT scans will need to be done more frequently if the pulmonary nodule is larger and if you have a history of smoking and a family history of cancer. Further testing or biopsies may be done if any follow-up CT scan shows that the size of the pulmonary nodule has increased. HOME CARE INSTRUCTIONS  Only take over-the-counter or prescription medicines as directed by your health  care provider.  Keep all follow-up appointments with your health care provider. SEEK MEDICAL CARE IF:  You have trouble breathing when you are active.   You feel sick or unusually tired.   You do not feel like eating.   You lose weight without trying to.   You develop chills or night sweats.  SEEK IMMEDIATE MEDICAL CARE IF:  You cannot catch your breath, or you begin wheezing.   You cannot stop coughing.   You cough up blood.   You become dizzy or feel like you are going to  pass out.   You have sudden chest pain.   You have a fever or persistent symptoms for more than 2-3 days.   You have a fever and your symptoms suddenly get worse. MAKE SURE YOU:  Understand these instructions.  Will watch your condition.  Will get help right away if you are not doing well or get worse.   This information is not intended to replace advice given to you by your health care provider. Make sure you discuss any questions you have with your health care provider.   Document Released: 09/01/2009 Document Revised: 07/07/2013 Document Reviewed: 04/26/2013 Elsevier Interactive Patient Education 2016 [employer].  Fluticasone nasal spray What is this medicine? FLUTICASONE (floo TIK a sone) is a corticosteroid. This medicine is used to treat the symptoms of allergies like sneezing, itchy red eyes, and itchy, runny, or stuffy nose. This medicine may be used for other purposes; ask your health care provider or pharmacist if you have questions. What should I tell my health care provider before I take this medicine? They need to know if you have any of these conditions: -infection, like tuberculosis, herpes, or fungal infection -recent surgery on nose or sinuses -taking corticosteroid by mouth -an unusual or allergic reaction to fluticasone, steroids, other medicines, foods, dyes, or preservatives -pregnant or trying to get pregnant -breast-feeding How should I use this medicine? This medicine is for use in the nose. Follow the directions on your product or prescription label. This medicine works best if used at regular intervals. Do not use more often than directed. Make sure that you are using your nasal spray correctly. After 6 months of daily use without a prescription, talk to your doctor or health care professional before using it for a longer time. Ask your doctor or health care professional if you have any questions. Talk to your pediatrician regarding the use of this  medicine in children. Special care may be needed. This medicine has been used in children as young as 2 years. After two months of daily use without a prescription in a child, talk to your pediatrician before using it for a longer time. Overdosage: If you think you have taken too much of this medicine contact a poison control center or emergency room at once. NOTE: This medicine is only for you. Do not share this medicine with others. What if I miss a dose? If you miss a dose, use it as soon as you remember. If it is almost time for your next dose, use only that dose and continue with your regular schedule. Do not use double or extra doses. What may interact with this medicine? -ketoconazole -metyrapone -some medicines for HIV -vaccines This list may not describe all possible interactions. Give your health care provider a list of all the medicines, herbs, non-prescription drugs, or dietary supplements you use. Also tell them if you smoke, drink alcohol, or use illegal drugs. Some items may  interact with your medicine. What should I watch for while using this medicine? Visit your doctor or health care professional for regular checks on your progress. Some symptoms may improve within 12 hours after starting use. Check with your doctor or health care professional if there is no improvement in your condition after 3 weeks of use. Do not come in contact with people who have chickenpox or the measles while you are taking this medicine. If you do, call your doctor right away. What side effects may I notice from receiving this medicine? Side effects that you should report to your doctor or health care professional as soon as possible: -allergic reactions like skin rash, itching or hives, swelling of the face, lips, or tongue -changes in vision -flu-like symptoms -white patches or sores in the mouth or nose Side effects that usually do not require medical attention (report to your doctor or health care  professional if they continue or are bothersome): -burning or irritation inside the nose or throat -cough -headache -nosebleed -unusual taste or smell This list may not describe all possible side effects. Call your doctor for medical advice about side effects. You may report side effects to FDA at 1-800-FDA-1088. Where should I keep my medicine? Keep out of the reach of children. Store at room temperature between 15 and 30 degrees C (59 and 86 degrees F). Throw away any unused medicine after the expiration date. NOTE: This sheet is a summary. It may not cover all possible information. If you have questions about this medicine, talk to your doctor, pharmacist, or health care provider.    2016, Elsevier/Gold Standard. (2014-03-30 09:07:53)

## 2015-11-08 NOTE — Assessment & Plan Note (Signed)
She is following with rheumatology.  Getting injections with good results.  Continue this plan and follow up as needed.

## 2015-11-08 NOTE — Assessment & Plan Note (Signed)
Creatinine fluctuates in the mid 1.00's.  This is the same on previous bloodwork.  Likely related to history of HTN and DM.  She is well controlled for both of these chronic issues. Will monitor Cr either yearly or twice yearly as needed.

## 2015-11-08 NOTE — Assessment & Plan Note (Signed)
She is having worsening of her symptoms given the cold weather and possible has a mild viral URI today.  She has had nasal sprays in the past which have helped with her congestion.  She is interested in trying this again.   Plan:  Start flonase 1 spray into each nostril Continue claritin daily

## 2015-11-08 NOTE — Assessment & Plan Note (Signed)
This was an incidental finding on colonoscopy in 2009.  She is due for repeat in 2019.  She has no diarrhea, blood in stool or GI issues today.

## 2015-11-08 NOTE — Assessment & Plan Note (Signed)
Interestingly, her most recent PFTs show instead a mild obstructive disease and not restriction.    Will change diagnosis.   She is on advair and albuterol.  She is improving her walking.  CT scan of the chest may add some to our understanding of her lung pathology.

## 2015-11-08 NOTE — Telephone Encounter (Signed)
Called pt about CT Chest - no answer, left message she has been sch on 11/20/2014 @ 2pm with an arrival time of 1:45pm @ [facility]. And to call if she has any questions and to call [phone] if needs to re-schedule.

## 2015-11-08 NOTE — Assessment & Plan Note (Signed)
Has chronic stable disease, medically managed.  She has occasional mild central chest pain which improves with rest and without having to take nitro since last being seen.  She does have DOE, but work up has been extensive and not shown a cardiac source.  She is on atorvastatin, IMDUR and metoprolol, will continue these.

## 2015-11-08 NOTE — Assessment & Plan Note (Signed)
Ultrasound in 2013 showed stable mild disease.  She is on plavix and a statin which will be continued.  Follow up with cardiology as indicated.

## 2015-11-08 NOTE — Assessment & Plan Note (Signed)
A1C is 6.8.  She is doing well on glimepiride.  Will plan to continue this medication.   Further evaluation of DM at next visit.

## 2015-11-08 NOTE — Assessment & Plan Note (Signed)
She had a CT of her chest in 2008 which showed mild scarring and nodularity, likely post inflammatory.   Recommended at the time to screen only if risk factors for malignancy.   She has a history of COPD and mild obstruction without clear cause.  She has had exposure to wood smoke for much of her life given no central heating.  She has changed this behavior, but was told that it could explain her COPD.   CT chest without contrast ordered for pulmonary nodule follow up.

## 2015-11-08 NOTE — Assessment & Plan Note (Signed)
I am not clear where this diagnosis came from.  It is copied forward from a previous iteration of the chart.  I have reviewed the imaging we have available of her right clavicle and shoulder.  She has significant AC joint and rotator cuff disease in her right shoulder as evidenced by an MRI in 2015, impression below:   IMPRESSION: Large complete retracted rotator cuff tear which is likely chronicgiven the amount of fatty atrophy of the associated muscles. Torn and retracted long head biceps tendon. The superior labrum isalso degenerated and torn. Severe AC joint arthropathy with associated subcutaneous ganglioncyst. Glenohumeral joint degenerative changes.  They specifically noted no bony issues.    I do not think this is a bone tumor or significant finding which should be followed at this point.  I would not be surprised if she has chronic shoulder pain, however, and will manage this as indicated.

## 2015-11-08 NOTE — Progress Notes (Signed)
Subjective:    [name], female    DOB: 01/24/1934, 81 y.o.   MRN: [id number]  CC: 6 month follow up for DM and HTN  HPI   Christine [name] is a 81yo woman with PMH of HTN with hypertensive retinopathy, DM2, HLD, hypothyroidism, osteopenia, diverticulosis, carotid stenosis, allergies and unexplained DOE.   She has been seeing cardiology for her DOE and her work up so far has included a lexiscan which showed no ischemia, a TTE which showed normal EF and diastolic function, PFTs which showed small airway disease (would not explain current extent of symptoms), Hgb relatively normal, LHC with known stenosis which is chronic and unchanged, TSH normal. She has also had a CPX exam which showed deconditioning and cardiology recommended an exercise program.  Unclear if cardiac rehab would be an option based on notes.  Today, Christine [name] reports that her symptoms are much better.  She continues to have SOB, but now that she knows it is not something dangerous with her heart, she is trying to do more.  She will now push through her SOB to do "just a bit more" and then rest.  She feels this is helping.  She has plan to start a walking regimen at the mall, as there is not a good place to walk in her neighborhood.    She complains of increasing nasal drainage, sneezing and cough today.  She does not have fever, chills, sinus tenderness, headache, sputum production.  She has seasonal allergies, but this seems to be a bit worse for her.  She has taken a nasal spray in the past when her symptoms have worsened like this and is interested in another trial of flonase.    For her DM, she is doing well. She did not bring in her meter but reports that her BS are running in the 120s in the morning.  She is taking her glimepiride without issue.  She is following with an eye doctor for hypertensive retinopathy.  She also has HTN which is well controlled today, HLD, CAD and carotid stenosis.    She has a history of  carpal tunnel syndrome for which she sees a rheumatologist and she has been getting injections she reports.  She has no pain today.    She is a non smoker, but she has been exposed to wood smoke all of her life.  She reports that she was told that this exposure is why she has COPD.   She did not bring in her medications.    Review of Systems  Constitutional: Positive for activity change (for the better). Negative for fever and chills.  HENT: Positive for congestion, postnasal drip, rhinorrhea and sneezing. Negative for facial swelling, nosebleeds and sinus pressure.   Eyes: Negative for photophobia and visual disturbance.  Respiratory: Positive for cough and shortness of breath (getting better). Negative for choking and wheezing.   Cardiovascular: Positive for chest pain (occasional, similar to chronic stable angina she has). Negative for leg swelling.  Musculoskeletal: Positive for arthralgias. Negative for back pain and gait problem.  Neurological: Negative for dizziness and light-headedness.       Objective:   Physical Exam  Constitutional: She is oriented to person, place, and time. She appears well-developed and well-nourished. No distress.  Thin elderly woman  HENT:  Head: Normocephalic and atraumatic.  Mouth/Throat: Oropharynx is clear and moist. No oropharyngeal exudate.  No sinus tenderness  Eyes: Conjunctivae are normal. No scleral icterus.  Neck:  Normal range of motion. Neck supple.  Cardiovascular: Normal rate, regular rhythm and normal heart sounds.   No murmur heard. Pulmonary/Chest: Effort normal and breath sounds normal. No respiratory distress. She has no wheezes.  Abdominal: Soft. Bowel sounds are normal.  Musculoskeletal: She exhibits no edema or tenderness.  Lymphadenopathy:    She has no cervical adenopathy.  Neurological: She is alert and oriented to person, place, and time.  Psychiatric: She has a normal mood and affect. Her behavior is normal.    No labs  today, done in November      Assessment & Plan:  RTC in 3-4 months, sooner if needed

## 2015-11-08 NOTE — Assessment & Plan Note (Signed)
She is much improved today.  She is trying a planned exercise regimen and pushing through her SOB and fatigue a little more every day.  Much of her symptoms improved when she learned that it was not something very serious which reassured her.  I am going to get a CT scan of her chest today, but for different reasons listed below.

## 2015-11-21 ENCOUNTER — Ambulatory Visit (HOSPITAL_COMMUNITY): Admission: RE | Admit: 2015-11-21 | Payer: Medicare Other | Source: Ambulatory Visit

## 2015-11-22 ENCOUNTER — Telehealth: Payer: Self-pay | Admitting: *Deleted

## 2015-11-22 ENCOUNTER — Ambulatory Visit (HOSPITAL_COMMUNITY)
Admission: RE | Admit: 2015-11-22 | Discharge: 2015-11-22 | Disposition: A | Discharge status: Home / Self Care | Payer: Medicare Other | Source: Ambulatory Visit | Attending: Internal Medicine | Admitting: Internal Medicine

## 2015-11-22 DIAGNOSIS — R918 Other nonspecific abnormal finding of lung field: Secondary | ICD-10-CM | POA: Diagnosis not present

## 2015-11-22 DIAGNOSIS — R911 Solitary pulmonary nodule: Secondary | ICD-10-CM | POA: Diagnosis not present

## 2015-11-22 NOTE — Telephone Encounter (Signed)
Notes Recorded by Larey Dresser, MD on 11/13/2015  She does have significant chronotropic incompetence on her CPX. She is taking Toprol XL 100 mg daily. Have her cut this back to 50 mg daily to see if she feels better with the change.  11/22/15--pt advised, verbalized understanding.

## 2015-12-19 ENCOUNTER — Other Ambulatory Visit: Payer: Self-pay | Admitting: Internal Medicine

## 2015-12-19 NOTE — Telephone Encounter (Signed)
Have called pharm, they have the script but need a dwo form, faxing now i will complete

## 2015-12-19 NOTE — Telephone Encounter (Signed)
Pt requesting test strips to be filled @ [facility] on [address].

## 2015-12-20 ENCOUNTER — Telehealth: Payer: Self-pay | Admitting: Internal Medicine

## 2015-12-20 NOTE — Telephone Encounter (Signed)
Patient requesting her Test Strips.

## 2015-12-20 NOTE — Telephone Encounter (Signed)
Pt has refills on strips at [facility] on [address], LVM for Ms Claiborne Billings

## 2016-01-26 ENCOUNTER — Other Ambulatory Visit: Payer: Self-pay | Admitting: Internal Medicine

## 2016-01-26 MED ORDER — IMIPRAMINE HCL 50 MG PO TABS: 50.0000 mg | ORAL_TABLET | Freq: Two times a day (BID) | ORAL | Status: DC

## 2016-01-26 NOTE — Telephone Encounter (Signed)
Pt requesting a refill on her imipramine

## 2016-01-27 ENCOUNTER — Emergency Department (HOSPITAL_COMMUNITY)
Admission: EM | Admit: 2016-01-27 | Discharge: 2016-01-27 | Disposition: A | Discharge status: Home / Self Care | Payer: Medicare Other | Attending: Emergency Medicine | Admitting: Emergency Medicine

## 2016-01-27 ENCOUNTER — Encounter (HOSPITAL_COMMUNITY): Payer: Self-pay

## 2016-01-27 ENCOUNTER — Emergency Department (HOSPITAL_COMMUNITY): Payer: Medicare Other

## 2016-01-27 DIAGNOSIS — E119 Type 2 diabetes mellitus without complications: Secondary | ICD-10-CM | POA: Diagnosis not present

## 2016-01-27 DIAGNOSIS — J441 Chronic obstructive pulmonary disease with (acute) exacerbation: Secondary | ICD-10-CM | POA: Diagnosis not present

## 2016-01-27 DIAGNOSIS — Z7984 Long term (current) use of oral hypoglycemic drugs: Secondary | ICD-10-CM | POA: Diagnosis not present

## 2016-01-27 DIAGNOSIS — I5030 Unspecified diastolic (congestive) heart failure: Secondary | ICD-10-CM | POA: Insufficient documentation

## 2016-01-27 DIAGNOSIS — J440 Chronic obstructive pulmonary disease with acute lower respiratory infection: Secondary | ICD-10-CM | POA: Diagnosis not present

## 2016-01-27 DIAGNOSIS — R011 Cardiac murmur, unspecified: Secondary | ICD-10-CM | POA: Insufficient documentation

## 2016-01-27 DIAGNOSIS — Z880 Allergy status to penicillin: Secondary | ICD-10-CM | POA: Insufficient documentation

## 2016-01-27 DIAGNOSIS — E039 Hypothyroidism, unspecified: Secondary | ICD-10-CM | POA: Diagnosis not present

## 2016-01-27 DIAGNOSIS — Z7902 Long term (current) use of antithrombotics/antiplatelets: Secondary | ICD-10-CM | POA: Insufficient documentation

## 2016-01-27 DIAGNOSIS — E785 Hyperlipidemia, unspecified: Secondary | ICD-10-CM | POA: Insufficient documentation

## 2016-01-27 DIAGNOSIS — Z7951 Long term (current) use of inhaled steroids: Secondary | ICD-10-CM | POA: Insufficient documentation

## 2016-01-27 DIAGNOSIS — R05 Cough: Secondary | ICD-10-CM | POA: Diagnosis not present

## 2016-01-27 DIAGNOSIS — IMO0001 Reserved for inherently not codable concepts without codable children: Secondary | ICD-10-CM

## 2016-01-27 DIAGNOSIS — Z862 Personal history of diseases of the blood and blood-forming organs and certain disorders involving the immune mechanism: Secondary | ICD-10-CM | POA: Insufficient documentation

## 2016-01-27 DIAGNOSIS — Z9889 Other specified postprocedural states: Secondary | ICD-10-CM | POA: Diagnosis not present

## 2016-01-27 DIAGNOSIS — N183 Chronic kidney disease, stage 3 (moderate): Secondary | ICD-10-CM | POA: Insufficient documentation

## 2016-01-27 DIAGNOSIS — K219 Gastro-esophageal reflux disease without esophagitis: Secondary | ICD-10-CM | POA: Insufficient documentation

## 2016-01-27 DIAGNOSIS — Z79899 Other long term (current) drug therapy: Secondary | ICD-10-CM | POA: Diagnosis not present

## 2016-01-27 DIAGNOSIS — I130 Hypertensive heart and chronic kidney disease with heart failure and stage 1 through stage 4 chronic kidney disease, or unspecified chronic kidney disease: Secondary | ICD-10-CM | POA: Diagnosis not present

## 2016-01-27 DIAGNOSIS — I25119 Atherosclerotic heart disease of native coronary artery with unspecified angina pectoris: Secondary | ICD-10-CM | POA: Diagnosis not present

## 2016-01-27 DIAGNOSIS — Z8739 Personal history of other diseases of the musculoskeletal system and connective tissue: Secondary | ICD-10-CM | POA: Diagnosis not present

## 2016-01-27 LAB — I-STAT CG4 LACTIC ACID, ED: Lactic Acid, Venous: 0.85 mmol/L (ref 0.5–2.0)

## 2016-01-27 LAB — BASIC METABOLIC PANEL
ANION GAP: 11 (ref 5–15)
BUN: 14 mg/dL (ref 6–20)
CALCIUM: 9.8 mg/dL (ref 8.9–10.3)
CHLORIDE: 97 mmol/L — AB (ref 101–111)
CO2: 27 mmol/L (ref 22–32)
CREATININE: 0.97 mg/dL (ref 0.44–1.00)
GFR calc non Af Amer: 54 mL/min — ABNORMAL LOW (ref >60)
Glucose, Bld: 92 mg/dL (ref 65–99)
Potassium: 4.3 mmol/L (ref 3.5–5.1)
SODIUM: 135 mmol/L (ref 135–145)

## 2016-01-27 LAB — CBC
HCT: 38.4 % (ref 36.0–46.0)
Hemoglobin: 12.2 g/dL (ref 12.0–15.0)
MCH: 28.6 pg (ref 26.0–34.0)
MCHC: 31.8 g/dL (ref 30.0–36.0)
MCV: 89.9 fL (ref 78.0–100.0)
PLATELETS: 251 10*3/uL (ref 150–400)
RBC: 4.27 MIL/uL (ref 3.87–5.11)
RDW: 14 % (ref 11.5–15.5)
WBC: 4.4 10*3/uL (ref 4.0–10.5)

## 2016-01-27 LAB — I-STAT TROPONIN, ED: TROPONIN I, POC: 0 ng/mL (ref 0.00–0.08)

## 2016-01-27 LAB — PROTIME-INR
INR: 1.05 (ref 0.00–1.49)
PROTHROMBIN TIME: 13.9 s (ref 11.6–15.2)

## 2016-01-27 MED ORDER — PREDNISONE 20 MG PO TABS
60.0000 mg | ORAL_TABLET | Freq: Once | ORAL | Status: AC
  Administered 2016-01-27: 60 mg via ORAL
  Filled 2016-01-27: qty 3

## 2016-01-27 MED ORDER — SODIUM CHLORIDE 0.9 % IV BOLUS (SEPSIS): 500.0000 mL | Freq: Once | INTRAVENOUS | Status: DC

## 2016-01-27 MED ORDER — ALBUTEROL SULFATE (2.5 MG/3ML) 0.083% IN NEBU
5.0000 mg | INHALATION_SOLUTION | Freq: Once | RESPIRATORY_TRACT | Status: AC
  Administered 2016-01-27: 5 mg via RESPIRATORY_TRACT
  Filled 2016-01-27: qty 6

## 2016-01-27 MED ORDER — PREDNISONE 50 MG PO TABS: 50.0000 mg | ORAL_TABLET | Freq: Every day | ORAL | Status: DC

## 2016-01-27 MED ORDER — MOXIFLOXACIN HCL 400 MG PO TABS: 400.0000 mg | ORAL_TABLET | Freq: Every day | ORAL | Status: DC

## 2016-01-27 NOTE — ED Notes (Signed)
Pt. Reports having a congested cough since Monday.  She did have nasal congestion but that has cleared up.  She had a sore throat yesterday but today its gone.  She is weak and continues to have cough.  Mild body aches.  Skin is warm and dry.  Pt. Denies any fevers,  Pt. Is alert and oriented x4

## 2016-01-27 NOTE — ED Provider Notes (Signed)
CSN: [id number]     Arrival date & time 01/27/16  1442 History   First MD Initiated Contact with Patient 01/27/16 1623     Chief Complaint  Patient presents with  . Cough     (Consider location/radiation/quality/duration/timing/severity/associated sxs/prior Treatment) HPI  Pt presenting with c/o cough and congestion over the past 5-6 days.  She states she was feeling some better yesterday but today cough is worse and she has been more fatigued.  No chest pain except at time of coughing.  No fever/chills.  She states cough is nonproductive.  She has not had fever.  She states she has been drinking a lot of liquids but has not had good appetite for solid foods.  No vomiting or change in stools.  No fainting.  No focal weakness.  She does have hx of COPD- denies recent steroid use.  Has used albuterol inhaler- but last dose was yesterday with minimal relief.  There are no other associated systemic symptoms, there are no other alleviating or modifying factors.   Past Medical History  Diagnosis Date  . GERD (gastroesophageal reflux disease)   . Hypertension   . Hypothyroidism   . Anemia   . Hyperlipidemia   . Sinus arrhythmia   . History of migraine headaches     a. Next atypical symptoms in the past. Patient was started on Neurontin for possible neuropathic origin of her pain.  Marland Kitchen COPD (chronic obstructive pulmonary disease) ([place])     a. PFTs 2011 show FEV1/FVC of 96%  . Heart murmur, systolic     2-D echo in December 2011 showed a normal EF with grade 1 diastolic dysfunction, trivial pulmonary regurgitation and mildly elevated PA pressure at 37 mmHg probably secondary to her COPD.dynamic obstruction-mid cavity obliteration;  b. 02/2012 Echo: EF 60-65%, mild LVH, PASP 52mmHg.  Marland Kitchen CAD (coronary artery disease)     a. LHC 08/23/11: dLM 40-50%, oRI 40%, oCFX 40%, oD1 70% (small and not amenable to PCI).  LM lesion did not appear to be flow limiting.  Medical rx was recommended;  b. Echo 08/23/11:  mild LVH, EF 60-65%, grade 1 diast dysfxn, mild BAE, PASP 24;  c. 02/2012  Cath: LM 50d, LAD 50p, D1 70ost, RI 70p, RCA ok->Med Rx;  d. 01/2013 Cardiolite: EF 88, no ischemia/infarct.  . Carotid stenosis     a. dopplers 10/12:  0-39% bilat ICA;  b. 10/2012 U/S: 0-39% bilat, f/u 1 yr (10/2013).  . Chronic bronchitis   . Type II diabetes mellitus ([place])     a. On oral hypoglycemic agents.  . Blood transfusion 1972    "after daughter born, attempted to give me blood; couldn't give it cause my blood was cold" (09/23/2013)  . History of stomach ulcers 1970's  . Polymyalgia rheumatica ([place])   . Diverticulosis of colon (without mention of hemorrhage)   . 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 Migraines   . Anginal pain ([place])   . Shortness of breath on exertion     "just related to angina >1 yr ago" (09/23/2013)  . Arthritis     "back, arms, hips" (09/23/2013)   Past Surgical History  Procedure Laterality Date  . Cataract extraction w/ intraocular lens  implant, bilateral Bilateral 1990's  . Total abdominal hysterectomy  1976  . Left heart catheterization with coronary angiogram N/A 03/16/2012    Procedure: LEFT HEART CATHETERIZATION WITH CORONARY ANGIOGRAM;  Surgeon:  Hillary Bow, MD;  Location: [facility] CATH LAB;  Service: Cardiovascular;  Laterality: N/A;  . Cardiac catheterization N/A 09/26/2015    Procedure: Right/Left Heart Cath and Coronary Angiography;  Surgeon: Larey Dresser, MD;  Location: [facility] CV LAB;  Service: Cardiovascular;  Laterality: N/A;   Family History  Problem Relation Age of Onset  . Colon cancer Maternal Grandmother   . COPD Father   . Other Mother     died of unknown causes in her 38's. Pt raised by grandmother.  . Pulmonary Hypertension Daughter    Social History  Substance Use Topics  . Smoking status: Never Smoker   . Smokeless tobacco: Never Used  . Alcohol Use: No     Comment: "Last drink  1967"   OB History    No data available     Review of Systems  ROS reviewed and all otherwise negative except for mentioned in HPI    Allergies  Aspirin; Codeine; and Penicillins  Home Medications   Prior to Admission medications   Medication Sig Start Date End Date Taking? Authorizing Provider  acetaminophen (TYLENOL) 500 MG tablet Take 1,000 mg by mouth every 6 (six) hours as needed for headache. For pain   Yes Historical Provider, MD  ADVAIR DISKUS 250-50 MCG/DOSE AEPB inhale 1 dose by mouth every 12 hours 08/31/15  Yes [name], MD  atorvastatin (LIPITOR) 40 MG tablet Take 1 tablet (40 mg total) by mouth every evening. 03/01/15  Yes [name], MD  clopidogrel (PLAVIX) 75 MG tablet take 1 tablet by mouth once daily Patient taking differently: take 1 tablet by mouth every evening 08/16/15  Yes [name], MD  esomeprazole (NEXIUM) 40 MG capsule take 1 capsule by mouth once daily BEFORE BREAKFAST 08/15/15  Yes Inda Castle, MD  fluticasone [facility]) 50 MCG/ACT nasal spray Place 1 spray into both nostrils daily. 11/08/15 11/07/16 Yes [name], MD  glimepiride (AMARYL) 1 MG tablet take 1 tablet by mouth once daily BEFORE BREAKFAST 08/23/15  Yes [name], MD  hydroxypropyl methylcellulose (ISOPTO TEARS) 2.5 % ophthalmic solution Place 1 drop into both eyes 2 (two) times daily.    Yes Historical Provider, MD  imipramine (TOFRANIL) 50 MG tablet Take 1 tablet (50 mg total) by mouth 2 (two) times daily. 01/26/16  Yes [name], MD  isosorbide mononitrate (IMDUR) 60 MG 24 hr tablet take 2 tablets by mouth once daily 07/10/15  Yes Larey Dresser, MD  levothyroxine (SYNTHROID, LEVOTHROID) 25 MCG tablet take 1 tablet by mouth once daily 06/08/15  Yes [name], MD  loratadine (CLARITIN) 10 MG tablet Take 1 tablet (10 mg total) by mouth daily. 10/11/14  Yes [name], MD  metoprolol succinate (TOPROL-XL) 100 MG 24 hr tablet Take 50 mg by mouth daily.  11/22/15   Yes Larey Dresser, MD  Multiple Vitamin (MULITIVITAMIN WITH MINERALS) TABS Take 1 tablet by mouth daily.   Yes Historical Provider, MD  NITROSTAT 0.4 MG SL tablet place 1 tablet under the tongue if needed every 5 minutes for chest pain for 3 doses IF NO RELIEF AFTER 3RD DOSE CALL PRESCRIBER OR 911. 06/15/15  Yes [name], MD  promethazine (PHENERGAN) 12.5 MG tablet Take 1 tablet (12.5 mg total) by mouth every 6 (six) hours as needed for nausea or vomiting (Prior to pain medications). 09/26/15  Yes Rogelia Mire, NP  valsartan (DIOVAN) 320 MG tablet take 1 tablet by  mouth once daily Patient taking differently: take 1 tablet by mouth every evening 10/05/15  Yes [name], MD  glucose blood (ONE TOUCH ULTRA TEST) test strip 1 each by Other route as needed for other. Use 1 to 2 times daily to check blood sugar. diag code 250.02. Non-insulin dependent 11/08/15   [name], MD  moxifloxacin (AVELOX) 400 MG tablet Take 1 tablet (400 mg total) by mouth daily at 8 pm. 01/27/16   Alfonzo Beers, MD  [facility] DELICA LANCETS [id number] MISC Inject 1 each into the skin as needed. Use 1 to 2 times daily to check blood sugar. diag code 250.02. Non-insulin dependent 06/08/14   [name], MD  predniSONE (DELTASONE) 50 MG tablet Take 1 tablet (50 mg total) by mouth daily. 01/27/16   Alfonzo Beers, MD  PROAIR HFA 108 (90 BASE) MCG/ACT inhaler inhale 2 puffs every 6 hours for shortness of breath or wheezing Patient not taking: Reported on 01/27/2016 08/31/15   [name], MD   BP 139/68 mmHg  Pulse 72  Temp(Src) 98.3 F (36.8 C) (Oral)  Resp 18  Ht 5\' 4"  (1.626 m)  Wt 60.782 kg  BMI 22.99 kg/m2  SpO2 98%  LMP 02/11/1975  Vitals reviewed Physical Exam  Physical Examination: General appearance - alert, well appearing, and in no distress Mental status - alert, oriented to person, place, and time Eyes - no conjunctival injection, no scleral icterus Mouth - mucous membranes moist, pharynx  normal without lesions Chest - clear to auscultation, no wheezes, rales or rhonchi, symmetric air entry, normal respiratory effort with frequent coughing Heart - normal rate, regular rhythm, normal S1, S2, no murmurs, rubs, clicks or gallops Abdomen - soft, nontender, nondistended, no masses or organomegaly Neurological - alert, oriented, normal speech Extremities - peripheral pulses normal, no pedal edema, no clubbing or cyanosis Skin - normal coloration and turgor, no rashes  ED Course  Procedures (including critical care time) Labs Review Labs Reviewed  BASIC METABOLIC PANEL - Abnormal; Notable for the following:    Chloride 97 (*)    GFR calc non Af Amer 54 (*)    All other components within normal limits  CBC  PROTIME-INR  I-STAT TROPOININ, ED  I-STAT CG4 LACTIC ACID, ED  I-STAT CG4 LACTIC ACID, ED    Imaging Review Dg Chest 2 View  01/27/2016  CLINICAL DATA:  Progressive cough, central chest pain for 1 week. EXAM: CHEST  2 VIEW COMPARISON:  Chest CT 11/22/2015 FINDINGS: Airspace opacity noted at the left lung base left knee related to the posterior eventration diaphragm noted on prior CT. No confluent airspace opacity. Heart is normal size. No effusions or acute bony abnormality. IMPRESSION: Eventration of the left hemidiaphragm. No active disease. Electronically Signed   By: Rolm Baptise M.D.   On: 01/27/2016 17:41   I have personally reviewed and evaluated these images and lab results as part of my medical decision-making.   EKG Interpretation   Date/Time:  Saturday January 27 2016 16:04:37 EST Ventricular Rate:  76 PR Interval:  180 QRS Duration: 78 QT Interval:  388 QTC Calculation: 436 R Axis:   -22 Text Interpretation:  Sinus rhythm with Premature atrial complexes  Possible Anteroseptal infarct , age undetermined Abnormal ECG No  significant change since last tracing Confirmed by [facility]  MD, [place]  [phone]) on 01/27/2016 4:53:58 PM      MDM   Final diagnoses:   COPD bronchitis    Pt presenting with cough  and fatigue over the past several days.  Workup today reassuring, no pneumonia on cxr, labs reassuring.  Initial HR of 128, decreased to 70s without intervention.  Pt started on steroids, given albuterol treatment and will treat with moxifloxacin. Recommended close f/u with PMD.  Instructed to use albuterol inhaler every 4 hours at home.  Discharged with strict return precautions.  Pt agreeable with plan.    Alfonzo Beers, MD 01/27/16 [phone]

## 2016-01-27 NOTE — ED Notes (Signed)
Pt is in stable condition upon d/c and is escorted from ED via wheelchair. 

## 2016-01-27 NOTE — Discharge Instructions (Signed)
Return to the ED with any concerns including difficulty breathing despite using albuterol every 4 hours, not drinking fluids, decreased urine output, vomiting and not able to keep down liquids or medications, decreased level of alertness/lethargy, or any other alarming symptoms °

## 2016-01-27 NOTE — ED Notes (Signed)
Patient transported to X-ray 

## 2016-02-12 ENCOUNTER — Other Ambulatory Visit: Payer: Self-pay | Admitting: Cardiology

## 2016-02-12 ENCOUNTER — Other Ambulatory Visit: Payer: Self-pay | Admitting: *Deleted

## 2016-02-12 MED ORDER — LEVOTHYROXINE SODIUM 25 MCG PO TABS: 25.0000 ug | ORAL_TABLET | Freq: Every day | ORAL | Status: DC

## 2016-02-16 ENCOUNTER — Encounter: Payer: Self-pay | Admitting: *Deleted

## 2016-02-27 DIAGNOSIS — E119 Type 2 diabetes mellitus without complications: Secondary | ICD-10-CM | POA: Diagnosis not present

## 2016-02-27 DIAGNOSIS — H524 Presbyopia: Secondary | ICD-10-CM | POA: Diagnosis not present

## 2016-02-27 DIAGNOSIS — H26491 Other secondary cataract, right eye: Secondary | ICD-10-CM | POA: Diagnosis not present

## 2016-02-27 DIAGNOSIS — H04123 Dry eye syndrome of bilateral lacrimal glands: Secondary | ICD-10-CM | POA: Diagnosis not present

## 2016-02-27 DIAGNOSIS — H35033 Hypertensive retinopathy, bilateral: Secondary | ICD-10-CM | POA: Diagnosis not present

## 2016-02-27 LAB — HM DIABETES EYE EXAM

## 2016-03-08 ENCOUNTER — Other Ambulatory Visit: Payer: Self-pay

## 2016-03-08 ENCOUNTER — Encounter: Payer: Self-pay | Admitting: *Deleted

## 2016-03-08 DIAGNOSIS — E785 Hyperlipidemia, unspecified: Secondary | ICD-10-CM

## 2016-03-08 MED ORDER — ATORVASTATIN CALCIUM 40 MG PO TABS: 40.0000 mg | ORAL_TABLET | Freq: Every evening | ORAL | Status: DC

## 2016-03-08 NOTE — Telephone Encounter (Signed)
Upcoming appointment

## 2016-04-02 DIAGNOSIS — Z79899 Other long term (current) drug therapy: Secondary | ICD-10-CM | POA: Diagnosis not present

## 2016-04-02 DIAGNOSIS — G5601 Carpal tunnel syndrome, right upper limb: Secondary | ICD-10-CM | POA: Diagnosis not present

## 2016-04-02 DIAGNOSIS — R110 Nausea: Secondary | ICD-10-CM | POA: Diagnosis not present

## 2016-04-02 DIAGNOSIS — M65341 Trigger finger, right ring finger: Secondary | ICD-10-CM | POA: Diagnosis not present

## 2016-04-08 ENCOUNTER — Other Ambulatory Visit: Payer: Self-pay

## 2016-04-08 MED ORDER — CLOPIDOGREL BISULFATE 75 MG PO TABS: 75.0000 mg | ORAL_TABLET | Freq: Every evening | ORAL | Status: DC

## 2016-04-11 ENCOUNTER — Ambulatory Visit (HOSPITAL_COMMUNITY)
Admission: RE | Admit: 2016-04-11 | Discharge: 2016-04-11 | Disposition: A | Discharge status: Home / Self Care | Payer: Medicare Other | Source: Ambulatory Visit | Attending: Internal Medicine | Admitting: Internal Medicine

## 2016-04-11 ENCOUNTER — Ambulatory Visit (INDEPENDENT_AMBULATORY_CARE_PROVIDER_SITE_OTHER): Payer: Medicare Other | Admitting: Cardiology

## 2016-04-11 ENCOUNTER — Encounter: Payer: Self-pay | Admitting: Internal Medicine

## 2016-04-11 ENCOUNTER — Ambulatory Visit (INDEPENDENT_AMBULATORY_CARE_PROVIDER_SITE_OTHER): Payer: Medicare Other | Admitting: Internal Medicine

## 2016-04-11 ENCOUNTER — Telehealth: Payer: Self-pay | Admitting: Cardiology

## 2016-04-11 ENCOUNTER — Encounter: Payer: Self-pay | Admitting: Cardiology

## 2016-04-11 VITALS — BP 133/48 | HR 42 | Temp 98.0°F | Ht 64.0 in | Wt 132.8 lb

## 2016-04-11 VITALS — BP 128/72 | HR 69 | Ht 64.0 in | Wt 131.2 lb

## 2016-04-11 DIAGNOSIS — R9431 Abnormal electrocardiogram [ECG] [EKG]: Secondary | ICD-10-CM | POA: Diagnosis not present

## 2016-04-11 DIAGNOSIS — I951 Orthostatic hypotension: Secondary | ICD-10-CM

## 2016-04-11 DIAGNOSIS — I2510 Atherosclerotic heart disease of native coronary artery without angina pectoris: Secondary | ICD-10-CM

## 2016-04-11 DIAGNOSIS — I10 Essential (primary) hypertension: Secondary | ICD-10-CM | POA: Diagnosis not present

## 2016-04-11 DIAGNOSIS — E119 Type 2 diabetes mellitus without complications: Secondary | ICD-10-CM | POA: Diagnosis not present

## 2016-04-11 DIAGNOSIS — I2583 Coronary atherosclerosis due to lipid rich plaque: Secondary | ICD-10-CM

## 2016-04-11 DIAGNOSIS — E039 Hypothyroidism, unspecified: Secondary | ICD-10-CM | POA: Diagnosis not present

## 2016-04-11 DIAGNOSIS — E785 Hyperlipidemia, unspecified: Secondary | ICD-10-CM

## 2016-04-11 DIAGNOSIS — R42 Dizziness and giddiness: Secondary | ICD-10-CM | POA: Diagnosis present

## 2016-04-11 DIAGNOSIS — R001 Bradycardia, unspecified: Secondary | ICD-10-CM

## 2016-04-11 LAB — BASIC METABOLIC PANEL
ANION GAP: 6 (ref 5–15)
BUN: 19 mg/dL (ref 6–20)
CHLORIDE: 105 mmol/L (ref 101–111)
CO2: 26 mmol/L (ref 22–32)
CREATININE: 1.14 mg/dL — AB (ref 0.44–1.00)
Calcium: 9.2 mg/dL (ref 8.9–10.3)
GFR calc non Af Amer: 44 mL/min — ABNORMAL LOW (ref >60)
GFR, EST AFRICAN AMERICAN: 51 mL/min — AB (ref >60)
GLUCOSE: 102 mg/dL — AB (ref 65–99)
Potassium: 3.7 mmol/L (ref 3.5–5.1)
Sodium: 137 mmol/L (ref 135–145)

## 2016-04-11 LAB — MAGNESIUM: Magnesium: 2 mg/dL (ref 1.7–2.4)

## 2016-04-11 LAB — GLUCOSE, CAPILLARY: GLUCOSE-CAPILLARY: 120 mg/dL — AB (ref 65–99)

## 2016-04-11 LAB — TSH: TSH: 2.544 u[IU]/mL (ref 0.350–4.500)

## 2016-04-11 MED ORDER — ISOSORBIDE MONONITRATE ER 30 MG PO TB24: 30.0000 mg | ORAL_TABLET | Freq: Every day | ORAL | Status: DC

## 2016-04-11 MED ORDER — ISOSORBIDE MONONITRATE ER 60 MG PO TB24: 60.0000 mg | ORAL_TABLET | Freq: Every day | ORAL | Status: DC

## 2016-04-11 NOTE — Assessment & Plan Note (Signed)
CBG 120 today. -f/u with Dr. Daryll Drown in June

## 2016-04-11 NOTE — Telephone Encounter (Signed)
I spoke with Dr Gordy Levan, okay to schedule with APP. Pt has been scheduled to see Cecilie Kicks, NP today at 11:30AM.

## 2016-04-11 NOTE — Assessment & Plan Note (Signed)
Reports taking synthroid daily. -check TSH

## 2016-04-11 NOTE — Patient Instructions (Signed)
Your physician has recommended you make the following change in your medication:  1.) change your isosorbide (IMDUR) to 60 mg 1 tablet once daily  Your physician has recommended that you wear a holter monitor. Holter monitors are medical devices that record the heart's electrical activity. Doctors most often use these monitors to diagnose arrhythmias. Arrhythmias are problems with the speed or rhythm of the heartbeat. The monitor is a small, portable device. You can wear one while you do your normal daily activities. This is usually used to diagnose what is causing palpitations/syncope (passing out).  Your physician recommends that you schedule a follow-up appointment next week with FLEX PER LAURA INGOLD, NP  Try wearing support stockings every day, remove at night.

## 2016-04-11 NOTE — Progress Notes (Signed)
Contacted Dr. Claris Gladden office for next available appointment. Explained symptoms of sudden onset of weakness and dizziness and irregular bradycardia. To send note to nurse to try to fit patient into his schedule. Awaiting call back.

## 2016-04-11 NOTE — Patient Instructions (Signed)
Thank you for your visit today.   Please return to the internal medicine clinic in about 4 weeks or sooner if needed.   We will send you to Dr. Claris Gladden office for further evaluation and recommendations.    Please be sure to bring all of your medications with you to every visit; this includes herbal supplements, vitamins, eye drops, and any over-the-counter medications.   Should you have any questions regarding your medications and/or any new or worsening symptoms, please be sure to call the clinic at [phone].   If you believe that you are suffering from a life threatening condition or one that may result in the loss of limb or function, then you should call 911 and proceed to the nearest Emergency Department.   Dizziness Dizziness is a common problem. It makes you feel unsteady or lightheaded. You may feel like you are about to pass out (faint). Dizziness can lead to injury if you stumble or fall. Anyone can get dizzy, but dizziness is more common in older adults. This condition can be caused by a number of things, including:  Medicines.  Dehydration.  Illness. HOME CARE Following these instructions may help with your condition: Eating and Drinking  Drink enough fluid to keep your pee (urine) clear or pale yellow. This helps to keep you from getting dehydrated. Try to drink more clear fluids, such as water.  Do not drink alcohol.  Limit how much caffeine you drink or eat if told by your doctor.  Limit how much salt you drink or eat if told by your doctor. Activity  Avoid making quick movements.  When you stand up from sitting in a chair, steady yourself until you feel okay.  In the morning, first sit up on the side of the bed. When you feel okay, stand slowly while you hold onto something. Do this until you know that your balance is fine.  Move your legs often if you need to stand in one place for a long time. Tighten and relax your muscles in your legs while you are  standing.  Do not drive or use heavy machinery if you feel dizzy.  Avoid bending down if you feel dizzy. Place items in your home so that they are easy for you to reach without leaning over. Lifestyle  Do not use any tobacco products, including cigarettes, chewing tobacco, or electronic cigarettes. If you need help quitting, ask your doctor.  Try to lower your stress level, such as with yoga or meditation. Talk with your doctor if you need help. General Instructions  Watch your dizziness for any changes.  Take medicines only as told by your doctor. Talk with your doctor if you think that your dizziness is caused by a medicine that you are taking.  Tell a friend or a family member that you are feeling dizzy. If he or she notices any changes in your behavior, have this person call your doctor.  Keep all follow-up visits as told by your doctor. This is important. GET HELP IF:  Your dizziness does not go away.  Your dizziness or light-headedness gets worse.  You feel sick to your stomach (nauseous).  You have trouble hearing.  You have new symptoms.  You are unsteady on your feet or you feel like the room is spinning. GET HELP RIGHT AWAY IF:  You throw up (vomit) or have diarrhea and are unable to eat or drink anything.  You have trouble:  Talking.  Walking.  Swallowing.  Using your  arms, hands, or legs.  You feel generally weak.  You are not thinking clearly or you have trouble forming sentences. It may take a friend or family member to notice this.  You have:  Chest pain.  Pain in your belly (abdomen).  Shortness of breath.  Sweating.  Your vision changes.  You are bleeding.  You have a headache.  You have neck pain or a stiff neck.  You have a fever.   This information is not intended to replace advice given to you by your health care provider. Make sure you discuss any questions you have with your health care provider.   Document Released:  10/24/2011 Document Revised: 03/21/2015 Document Reviewed: 10/31/2014 Elsevier Interactive Patient Education [employer].

## 2016-04-11 NOTE — Assessment & Plan Note (Addendum)
Ms. Kice p/w onset of dizziness that has been going on for several weeks.  She reports the dizziness lasts for a few minutes and then subsides.  Denies room spinning sensation but states it is more of a lightheadedness.  These episodes have been occuring more frequently and mainly when getting up to stand.  However, she also states she is dizzy while sitting.  Denies any associated CP, palpitations, dyspnea, N/V.  She is taking her medications as prescribed.  She has been eating and drinking OK.  CBG today 120.  BP today 133/48, with HR of 43.  She reports Dr. Aundra Dubin told her to take 1/2 of the 100mg  metoprolol, so she has been taking 50mg  daily.  She is also taking imipramine 50mg  daily for migraine prevention.  Orthostatic VS were positive today with SBP dropping from 156 to 119 upon standing.  -will get EKG, stat BMP, TSH -will send to Dr. Claris Gladden office for further evaluation today   ADDENDUM: EKG similar to prior without TWI, ST changes.

## 2016-04-11 NOTE — Progress Notes (Signed)
Cardiology Office Note   Date:  04/11/2016   ID:  Christine [name], DOB 01/24/1934, MRN [id number]  PCP:  Gilles Chiquito, MD  Cardiologist:  Dr. Aundra Dubin    Chief Complaint  Patient presents with  . Dizziness    started Sunday  . Fatigue      History of Present Illness: Christine [name] is a 82 y.o. female who presents for eval for bradycardia at her PCP today along with dizziness. She was also orthostatic.    Left heart cath showed a 40-50% distal left main stenosis that was not thought to be flow-limiting. She additionally had a 75% stenosis in a diagonal. Chest pain resolved with medical therapy. She was admitted again in 4/13 with chest pain and exertional dyspnea. Cardiac enzymes were negative. LHC looked similar to prior with 50% distal LM, 70% ostial D1, and 70% ostial ramus. Echo showed preserved EF. A dobutamine stress echo was done, this was submaximal but did not show evidence for ischemia. No intervention, planned medical management. Anti-anginal meds were titrated up and she was discharged home. She developed chest pain (atypical) again in 2014. Lexiscan Cardiolite was normal in 3/14.   Christine [name] went to the ER in 10/16 with dyspnea. CXR showed clear lungs and BNP was 104. Lexiscan Cardiolite was done, no ischemia or infarction. She continues to have exertional dyspnea. She is short of breath walking about 50-100 feet if she walks fast. She is short of breath with showering or housework. She has significantly limiting her activity and is not even going to church. She has occasional mild, atypical left-sided chest pain. This does not tend to occur with exertion. No lightheadedness. No orthopnea/PND. Echo was done and was unremarkable.   Pt had cardiac cath with non obstructive disease see below and RHC.   Her troprol was decreased to 50 mg from 100 mg and now with dizziness with PCP today and HR 40s.  She was orthostatic as well.  Orthostatic VS were positive  today with SBP dropping from 156 to 119 upon standing. She just doesn't feel well.  Her PCP drew labs.  No chest pain except mild rt upper chest discomfort but she stated not like her heart pain.    Past Medical History  Diagnosis Date  . GERD (gastroesophageal reflux disease)   . Hypertension   . Hypothyroidism   . Anemia   . Hyperlipidemia   . Sinus arrhythmia   . History of migraine headaches     a. Next atypical symptoms in the past. Patient was started on Neurontin for possible neuropathic origin of her pain.  Marland Kitchen COPD (chronic obstructive pulmonary disease) ([place])     a. PFTs 2011 show FEV1/FVC of 96%  . Heart murmur, systolic     2-D echo in December 2011 showed a normal EF with grade 1 diastolic dysfunction, trivial pulmonary regurgitation and mildly elevated PA pressure at 37 mmHg probably secondary to her COPD.dynamic obstruction-mid cavity obliteration;  b. 02/2012 Echo: EF 60-65%, mild LVH, PASP 20mmHg.  Marland Kitchen CAD (coronary artery disease)     a. LHC 08/23/11: dLM 40-50%, oRI 40%, oCFX 40%, oD1 70% (small and not amenable to PCI).  LM lesion did not appear to be flow limiting.  Medical rx was recommended;  b. Echo 08/23/11: mild LVH, EF 60-65%, grade 1 diast dysfxn, mild BAE, PASP 24;  c. 02/2012  Cath: LM 50d, LAD 50p, D1 70ost, RI 70p, RCA ok->Med Rx;  d. 01/2013 Cardiolite: EF 88, no  ischemia/infarct.  . Carotid stenosis     a. dopplers 10/12:  0-39% bilat ICA;  b. 10/2012 U/S: 0-39% bilat, f/u 1 yr (10/2013).  . Chronic bronchitis   . Type II diabetes mellitus ([place])     a. On oral hypoglycemic agents.  . Blood transfusion 1972    "after daughter born, attempted to give me blood; couldn't give it cause my blood was cold" (09/23/2013)  . History of stomach ulcers 1970's  . Polymyalgia rheumatica ([place])   . Diverticulosis of colon (without mention of hemorrhage)   . Benign hypertensive heart and kidney disease with diastolic CHF, NYHA class II and CKD stage III ([place])   . Migraines   .  Anginal pain ([place])   . Shortness of breath on exertion     "just related to angina >1 yr ago" (09/23/2013)  . Arthritis     "back, arms, hips" (09/23/2013)    Past Surgical History  Procedure Laterality Date  . Cataract extraction w/ intraocular lens  implant, bilateral Bilateral 1990's  . Total abdominal hysterectomy  1976  . Left heart catheterization with coronary angiogram N/A 03/16/2012    Procedure: LEFT HEART CATHETERIZATION WITH CORONARY ANGIOGRAM;  Surgeon: Hillary Bow, MD;  Location: [facility] CATH LAB;  Service: Cardiovascular;  Laterality: N/A;  . Cardiac catheterization N/A 09/26/2015    Procedure: Right/Left Heart Cath and Coronary Angiography;  Surgeon: Larey Dresser, MD;  Location: [facility] CV LAB;  Service: Cardiovascular;  Laterality: N/A;     Current Outpatient Prescriptions  Medication Sig Dispense Refill  . acetaminophen (TYLENOL) 500 MG tablet Take 1,000 mg by mouth every 6 (six) hours as needed for headache. For pain    . ADVAIR DISKUS 250-50 MCG/DOSE AEPB inhale 1 dose by mouth every 12 hours 60 each 11  . atorvastatin (LIPITOR) 40 MG tablet Take 1 tablet (40 mg total) by mouth every evening. 90 tablet 3  . clopidogrel (PLAVIX) 75 MG tablet Take 1 tablet (75 mg total) by mouth every evening. 30 tablet 5  . esomeprazole (NEXIUM) 40 MG capsule take 1 capsule by mouth once daily BEFORE BREAKFAST 30 capsule 6  . fluticasone (FLONASE) 50 MCG/ACT nasal spray Place 1 spray into both nostrils daily. 16 g 2  . glimepiride (AMARYL) 1 MG tablet take 1 tablet by mouth once daily BEFORE BREAKFAST 90 tablet 3  . glucose blood (ONE TOUCH ULTRA TEST) test strip 1 each by Other route as needed for other. Use 1 to 2 times daily to check blood sugar. diag code 250.02. Non-insulin dependent 100 each 4  . hydroxypropyl methylcellulose (ISOPTO TEARS) 2.5 % ophthalmic solution Place 1 drop into both eyes 2 (two) times daily.     Marland Kitchen imipramine (TOFRANIL) 50 MG tablet Take 1 tablet (50 mg  total) by mouth 2 (two) times daily. 180 tablet 3  . levothyroxine (SYNTHROID, LEVOTHROID) 25 MCG tablet Take 1 tablet (25 mcg total) by mouth daily. 90 tablet 1  . loratadine (CLARITIN) 10 MG tablet Take 1 tablet (10 mg total) by mouth daily. 30 tablet 6  . metoprolol succinate (TOPROL-XL) 100 MG 24 hr tablet Take 50 mg by mouth daily.     . Multiple Vitamin (MULITIVITAMIN WITH MINERALS) TABS Take 1 tablet by mouth daily.    Marland Kitchen NITROSTAT 0.4 MG SL tablet place 1 tablet under the tongue if needed every 5 minutes for chest pain for 3 doses IF NO RELIEF AFTER 3RD DOSE CALL PRESCRIBER OR 911. 25 tablet 3  .  ONETOUCH DELICA LANCETS 99991111 MISC Inject 1 each into the skin as needed. Use 1 to 2 times daily to check blood sugar. diag code 250.02. Non-insulin dependent 200 each 4  . predniSONE (DELTASONE) 50 MG tablet Take 1 tablet (50 mg total) by mouth daily. 4 tablet 0  . promethazine (PHENERGAN) 12.5 MG tablet Take 1 tablet (12.5 mg total) by mouth every 6 (six) hours as needed for nausea or vomiting (Prior to pain medications). 30 tablet 0  . valsartan (DIOVAN) 320 MG tablet Take 320 mg by mouth daily.    . isosorbide mononitrate (IMDUR) 60 MG 24 hr tablet Take 1 tablet (60 mg total) by mouth daily. 90 tablet 3   No current facility-administered medications for this visit.    Allergies:   Aspirin; Codeine; and Penicillins    Social History:  The patient  reports that she has never smoked. She has never used smokeless tobacco. She reports that she does not drink alcohol or use illicit drugs.   Family History:  The patient's family history includes COPD in her father; Colon cancer in her maternal grandmother; Other in her mother; Pulmonary Hypertension in her daughter.    ROS:  General:no colds or fevers, +gradual weight loss.  Skin:no rashes or ulcers HEENT:no blurred vision, no congestion CV:see HPI PUL:see HPI GI:no diarrhea constipation or melena, no indigestion GU:no hematuria, no  dysuria MS:no joint pain, no claudication Neuro:no syncope, + lightheadedness, doesn't feel well Endo:+ diabetes, no thyroid disease  Wt Readings from Last 3 Encounters:  04/11/16 131 lb 3.2 oz (59.512 kg)  04/11/16 132 lb 12.8 oz (60.238 kg)  01/27/16 134 lb (60.782 kg)     PHYSICAL EXAM: VS:  BP 128/72 mmHg  Pulse 69  Ht 5\' 4"  (1.626 m)  Wt 131 lb 3.2 oz (59.512 kg)  BMI 22.51 kg/m2  LMP 02/11/1975 , BMI Body mass index is 22.51 kg/(m^2). General:Pleasant affect, NAD Skin:Warm and dry, brisk capillary refill HEENT:normocephalic, sclera clear, mucus membranes moist Neck:supple, no JVD, no bruits  Heart:S1S2 RRR without murmur, gallup, rub or click Lungs:clear without rales, rhonchi, or wheezes [id number], non tender, + BS, do not palpate liver spleen or masses Ext:no lower ext edema, 2+ pedal pulses, 2+ radial pulses Neuro:alert and oriented, MAE, follows commands, + facial symmetry    EKG:  EKG is ordered today. The ekg ordered today demonstrates SR with PACs HR 69 no acute changes otherwise.    Recent Labs: 08/28/2015: B Natriuretic Peptide 104.1* 01/27/2016: Hemoglobin 12.2; Platelets 251 04/11/2016: BUN 19; Creatinine, Ser 1.14*; Magnesium 2.0; Potassium 3.7; Sodium 137; TSH 2.544    Lipid Panel    Component Value Date/Time   CHOL 136 08/05/2014 0933   TRIG 169.0* 08/05/2014 0933   HDL 35.10* 08/05/2014 0933   CHOLHDL 4 08/05/2014 0933   VLDL 33.8 08/05/2014 0933   LDLCALC 67 08/05/2014 0933       Other studies Reviewed: Additional studies/ records that were reviewed today include: . Rt and LHC 09/26/15 Dominance: Right   Left Main  40-50% distal LM stenosis.     Left Anterior Descending  Luminal irregularities. 70-80% ostial D1(small to moderate vessel).     Left Circumflex  30% ostial LCx.     Right Coronary Artery  Luminal irregularities.       Right Heart Pressures RHC Procedural Findings: Hemodynamics (mmHg) RA mean 2 RV 29/5 PA  28/5 PCWP mean 5 LV 133/9 AO 127/49  Oxygen saturations: PA 60% AO 96%  Cardiac Output (  Fick) 3.74  Cardiac Index (Fick) 2.28    Wall Motion    EF 70%, vigorous contraction with no wall motion abnormalities.            Cardiopulmonary test:  10/24/15 with significant chronotropic incompetence on her CPX. She is taking Toprol XL 100 mg daily. Have her cut this back to 50 mg daily to see if she feels better with the change.  ASSESSMENT AND PLAN:  1.  Dizziness due to orthostatic hypotension.  wil decrease Imdur to 60 once a day.  Follow up next week for BP check and to re-eval.  I have asked her to get support stockings as well.[place] Labs at PCP office now SR with PACs.  Will have her wear 48 hour holter to further decide if BB needs to be decreased.   3. DM-2 glucose was 120 at PCP.   4. CAD: Non-flow limiting, moderate disease on cath in 11/16  including 40-50% distal LM . Nand the 70-80% D1 stenosis do not appear to have progressed compared to 2013 angiography. No ischemia or infarction on Lexiscan Cardiolite in 10/16. However, still has significant exertional symptoms. - Continue Plavix (allergic to aspirin), Toprol XL, valsartan, Imdur. - She is on atorvastatin.  3. Hyperlipidemia: Repeat lipids next appointment.  4. HTN: BP controlled.   Current medicines are reviewed with the patient today.  The patient Has no concerns regarding medicines.  The following changes have been made:  See above Labs/ tests ordered today include:see above  Disposition:   FU:  see above  Signed, Cecilie Kicks, NP  04/11/2016 12:34 PM    [facility] [address], [place], [place], [place] Phone: [phone]; Fax: [phone]

## 2016-04-11 NOTE — Progress Notes (Signed)
[name], female   DOB: 01/24/1934, 82 y.o.   MRN: [id number]     Subjective:   [name] female    DOB: 01/24/1934 82 y.o.    MRN: [id number] Health Maintenance Due: Health Maintenance Due  Topic Date Due  . ZOSTAVAX  01/24/1996  . PNA vac Low Risk Adult (2 of 2 - PCV13) 08/27/2012  . LIPID PANEL  08/06/2015  . FOOT EXAM  12/22/2015    _________________________________________________  HPI: Christine [name] is a 82 y.o. female here for dizziness.  Pt has a PMH outlined below.  Please see problem-based charting assessment and plan for further status of patient's chronic medical problems addressed at today's visit.  PMH: Past Medical History  Diagnosis Date  . GERD (gastroesophageal reflux disease)   . Hypertension   . Hypothyroidism   . Anemia   . Hyperlipidemia   . Sinus arrhythmia   . History of migraine headaches     a. Next atypical symptoms in the past. Patient was started on Neurontin for possible neuropathic origin of her pain.  Marland Kitchen COPD (chronic obstructive pulmonary disease) ([place])     a. PFTs 2011 show FEV1/FVC of 96%  . Heart murmur, systolic     2-D echo in December 2011 showed a normal EF with grade 1 diastolic dysfunction, trivial pulmonary regurgitation and mildly elevated PA pressure at 37 mmHg probably secondary to her COPD.dynamic obstruction-mid cavity obliteration;  b. 02/2012 Echo: EF 60-65%, mild LVH, PASP 35mmHg.  Marland Kitchen CAD (coronary artery disease)     a. LHC 08/23/11: dLM 40-50%, oRI 40%, oCFX 40%, oD1 70% (small and not amenable to PCI).  LM lesion did not appear to be flow limiting.  Medical rx was recommended;  b. Echo 08/23/11: mild LVH, EF 60-65%, grade 1 diast dysfxn, mild BAE, PASP 24;  c. 02/2012  Cath: LM 50d, LAD 50p, D1 70ost, RI 70p, RCA ok->Med Rx;  d. 01/2013 Cardiolite: EF 88, no ischemia/infarct.  . Carotid stenosis     a. dopplers 10/12:  0-39% bilat ICA;  b. 10/2012 U/S: 0-39% bilat, f/u 1 yr (10/2013).  . Chronic  bronchitis   . Type II diabetes mellitus ([place])     a. On oral hypoglycemic agents.  . Blood transfusion 1972    "after daughter born, attempted to give me blood; couldn't give it cause my blood was cold" (09/23/2013)  . History of stomach ulcers 1970's  . Polymyalgia rheumatica ([place])   . Diverticulosis of colon (without mention of hemorrhage)   . Benign hypertensive heart and kidney disease with diastolic CHF, NYHA class II and CKD stage III ([place])   . Migraines   . Anginal pain ([place])   . Shortness of breath on exertion     "just related to angina >1 yr ago" (09/23/2013)  . Arthritis     "back, arms, hips" (09/23/2013)    Medications: Current Outpatient Prescriptions on File Prior to Visit  Medication Sig Dispense Refill  . acetaminophen (TYLENOL) 500 MG tablet Take 1,000 mg by mouth every 6 (six) hours as needed for headache. For pain    . ADVAIR DISKUS 250-50 MCG/DOSE AEPB inhale 1 dose by mouth every 12 hours 60 each 11  . atorvastatin (LIPITOR) 40 MG tablet Take 1 tablet (40 mg total) by mouth every evening. 90 tablet 3  . clopidogrel (PLAVIX) 75 MG tablet Take 1 tablet (75 mg total) by mouth every evening. 30 tablet 5  . esomeprazole (  NEXIUM) 40 MG capsule take 1 capsule by mouth once daily BEFORE BREAKFAST 30 capsule 6  . fluticasone (FLONASE) 50 MCG/ACT nasal spray Place 1 spray into both nostrils daily. 16 g 2  . glimepiride (AMARYL) 1 MG tablet take 1 tablet by mouth once daily BEFORE BREAKFAST 90 tablet 3  . glucose blood (ONE TOUCH ULTRA TEST) test strip 1 each by Other route as needed for other. Use 1 to 2 times daily to check blood sugar. diag code 250.02. Non-insulin dependent 100 each 4  . hydroxypropyl methylcellulose (ISOPTO TEARS) 2.5 % ophthalmic solution Place 1 drop into both eyes 2 (two) times daily.     Marland Kitchen imipramine (TOFRANIL) 50 MG tablet Take 1 tablet (50 mg total) by mouth 2 (two) times daily. 180 tablet 3  . isosorbide mononitrate (IMDUR) 60 MG 24 hr tablet take 2  tablet by mouth once daily 60 tablet 7  . levothyroxine (SYNTHROID, LEVOTHROID) 25 MCG tablet Take 1 tablet (25 mcg total) by mouth daily. 90 tablet 1  . loratadine (CLARITIN) 10 MG tablet Take 1 tablet (10 mg total) by mouth daily. 30 tablet 6  . metoprolol succinate (TOPROL-XL) 100 MG 24 hr tablet Take 50 mg by mouth daily.     Marland Kitchen moxifloxacin (AVELOX) 400 MG tablet Take 1 tablet (400 mg total) by mouth daily at 8 pm. 10 tablet 0  . Multiple Vitamin (MULITIVITAMIN WITH MINERALS) TABS Take 1 tablet by mouth daily.    Marland Kitchen NITROSTAT 0.4 MG SL tablet place 1 tablet under the tongue if needed every 5 minutes for chest pain for 3 doses IF NO RELIEF AFTER 3RD DOSE CALL PRESCRIBER OR 911. 25 tablet 3  . ONETOUCH DELICA LANCETS 99991111 MISC Inject 1 each into the skin as needed. Use 1 to 2 times daily to check blood sugar. diag code 250.02. Non-insulin dependent 200 each 4  . predniSONE (DELTASONE) 50 MG tablet Take 1 tablet (50 mg total) by mouth daily. 4 tablet 0  . PROAIR HFA 108 (90 BASE) MCG/ACT inhaler inhale 2 puffs every 6 hours for shortness of breath or wheezing (Patient not taking: Reported on 01/27/2016) 8.5 Inhaler 11  . promethazine (PHENERGAN) 12.5 MG tablet Take 1 tablet (12.5 mg total) by mouth every 6 (six) hours as needed for nausea or vomiting (Prior to pain medications). 30 tablet 0  . valsartan (DIOVAN) 320 MG tablet take 1 tablet by mouth once daily (Patient taking differently: take 1 tablet by mouth every evening) 30 tablet 11   No current facility-administered medications on file prior to visit.    Allergies: Allergies  Allergen Reactions  . Aspirin Swelling and Other (See Comments)    Angioedema  . Codeine Nausea And Vomiting and Other (See Comments)    "makes me deathly sick" - can take with nausea medicine   . Penicillins Diarrhea    "real bad" Has patient had a PCN reaction causing immediate rash, facial/tongue/throat swelling, SOB or lightheadedness with hypotension: Yes Has  patient had a PCN reaction causing severe rash involving mucus membranes or skin necrosis: No Has patient had a PCN reaction that required hospitalization No Has patient had a PCN reaction occurring within the last 10 years: Yes If all of the above answers are "NO", then may proceed with Cephalosporin use.     FH: Family History  Problem Relation Age of Onset  . Colon cancer Maternal Grandmother   . COPD Father   . Other Mother     died of  unknown causes in her 79's. Pt raised by grandmother.  . Pulmonary Hypertension Daughter     SH: Social History   Social History  . Marital Status: Widowed    Spouse Name: N/A  . Number of Children: N/A  . Years of Education: N/A   Social History Main Topics  . Smoking status: Never Smoker   . Smokeless tobacco: Never Used  . Alcohol Use: No     Comment: "Last drink 1967"  . Drug Use: No  . Sexual Activity: Not Asked   Other Topics Concern  . None   Social History Narrative   Never smoked. Lives in [place] with her dtr.  Care for her daughter who has had a lung transplant. Retired Building surveyor.    Review of Systems: Constitutional: Negative for fever, chills.  Respiratory: Negative for cough and shortness of breath.  Cardiovascular: Negative for chest pain.  Gastrointestinal: Negative for nausea, vomiting. Neurological: +dizziness.   Objective:   Vital Signs: Filed Vitals:   04/11/16 0837 04/11/16 0838  BP:  133/48  Pulse:  42  Temp: 98 F (36.7 C)   TempSrc: Oral   Height: 5\' 4"  (1.626 m)   Weight: 132 lb 12.8 oz (60.238 kg)   SpO2:  100%      BP Readings from Last 3 Encounters:  04/11/16 133/48  01/27/16 139/68  11/08/15 137/59    Physical Exam: Constitutional: Vital signs reviewed.  Patient is in NAD and cooperative with exam.  Head: Normocephalic and atraumatic. Eyes: EOMI, conjunctivae nl, no scleral icterus.  Neck: Supple. Cardiovascular: Bradycardic with irregular rhythm.  Murmur noted.     Pulmonary/Chest: Normal effort, CTAB, no wheezes, rales, or rhonchi. Abdominal: Soft. NT/ND +BS. Neurological: A&O x3, cranial nerves II-XII are grossly intact, moving all extremities. Extremities: No LE edema. Skin: Warm, dry and intact. No rash.   Assessment & Plan:   Assessment and plan was discussed and formulated with my attending.

## 2016-04-11 NOTE — Telephone Encounter (Signed)
Christine [name] is calling because [name] is having a sudden onset of dizziness and weakness with a irregular bradycardic heart rate . Is wanting the patient to come in and see Dr. Aundra Dubin as soon as possible . Feels she needs to see the doctor and not a APP. Please call.   Thanks

## 2016-04-11 NOTE — Telephone Encounter (Signed)
Christine [name] aware of appt time, copy of EKG done today will be given to pt to bring to her appt with Mickel Baas today. Marland Kitchen

## 2016-04-12 ENCOUNTER — Other Ambulatory Visit: Payer: Self-pay | Admitting: Cardiology

## 2016-04-12 ENCOUNTER — Ambulatory Visit (INDEPENDENT_AMBULATORY_CARE_PROVIDER_SITE_OTHER): Payer: Medicare Other

## 2016-04-12 DIAGNOSIS — I951 Orthostatic hypotension: Secondary | ICD-10-CM

## 2016-04-12 DIAGNOSIS — R001 Bradycardia, unspecified: Secondary | ICD-10-CM

## 2016-04-12 NOTE — Progress Notes (Signed)
Internal Medicine Clinic Attending  Case discussed with Dr. Gill at the time of the visit.  We reviewed the resident's history and exam and pertinent patient test results.  I agree with the assessment, diagnosis, and plan of care documented in the resident's note.  

## 2016-04-16 ENCOUNTER — Ambulatory Visit: Payer: Medicare Other | Admitting: Physician Assistant

## 2016-04-19 ENCOUNTER — Encounter: Payer: Self-pay | Admitting: Physician Assistant

## 2016-04-19 ENCOUNTER — Ambulatory Visit (INDEPENDENT_AMBULATORY_CARE_PROVIDER_SITE_OTHER): Payer: Medicare Other | Admitting: Physician Assistant

## 2016-04-19 VITALS — BP 110/60 | HR 72 | Ht 64.0 in | Wt 130.6 lb

## 2016-04-19 DIAGNOSIS — I2510 Atherosclerotic heart disease of native coronary artery without angina pectoris: Secondary | ICD-10-CM | POA: Diagnosis not present

## 2016-04-19 DIAGNOSIS — R001 Bradycardia, unspecified: Secondary | ICD-10-CM

## 2016-04-19 DIAGNOSIS — R42 Dizziness and giddiness: Secondary | ICD-10-CM | POA: Diagnosis not present

## 2016-04-19 DIAGNOSIS — Z9889 Other specified postprocedural states: Secondary | ICD-10-CM | POA: Diagnosis not present

## 2016-04-19 MED ORDER — VALSARTAN 160 MG PO TABS: 160.0000 mg | ORAL_TABLET | Freq: Every day | ORAL | Status: DC

## 2016-04-19 MED ORDER — METOPROLOL SUCCINATE ER 25 MG PO TB24: 25.0000 mg | ORAL_TABLET | Freq: Every day | ORAL | Status: DC

## 2016-04-19 NOTE — Patient Instructions (Addendum)
Medication Instructions:  Your physician has recommended you make the following change in your medication:  1.  DECREASE the Toprol XL to 25 mg taking 1 tablet daily 2.  DECREASE THE Valsartan to 160 mg taking 1 tablet daily  Labwork: None ordered  Testing/Procedures:   Follow-Up: Your physician recommends that you schedule a follow-up appointment in: 3 MONTHS WITH DR. Aundra Dubin   Any Other Special Instructions Will Be Listed Below (If Applicable).  Your physician has requested that you regularly monitor and record your blood pressure readings at home. Please use the same machine at the same time of day to check your readings and record them to bring to your follow-up visit.  YOUR BLOOD PRESSURE GOAL IS AT LEAST 140 ON THE TOP BUT NOT HIGHER THAN 150    If you need a refill on your cardiac medications before your next appointment, please call your pharmacy.

## 2016-04-19 NOTE — Progress Notes (Signed)
CARDIOLOGY OFFICE NOTE  Date:  04/19/2016    Christine [name] Date of Birth: 01/24/1934 Medical Record [id number]  PCP:  Gilles Chiquito, MD  Cardiologist:  Dr. Aundra Dubin   Chief Complaint  Patient presents with  . Follow-up    seen for Dr. Aundra Dubin    History of Present Illness: Christine [name] is a 82 y.o. female who presents today for Cardiology office follow-up. She has PMH of HTN, hypothyroidism, HLD, DM, polymyalgia rheumatica, COPD, CKD stage III, CAD s/p multiple PCI and carotid artery stenosis. Her last echocardiogram obtained on 09/19/2015 showed EF 60-65%, no RWMA. Her last cardiac catheterization was on 09/26/2015 which showed 40-50% left main stenosis, 70-80% D1 stenosis that does not seem to have progressed compared to 2013 angiography, low filling pressure, no evidence of CHF to explain her dyspnea. Prior to that, she had cardiopulmonary stress test on 10/24/2015 which showed gas exchange demonstrates mild to moderate functional impairment when compared to matched norms, preexercise spirometry suggests mild obstruction, significant circulatory limitation with severe chronotropic incompetence. Her beta blocker was cut back given evidence of chronotropic incompetence.  The patient was seen in her PCP's office on 04/11/2016 for dizziness going on for several weeks. She reports the dizziness lasts a few minutes and then subside. Orthostatic vital sign was positive with blood pressure dropping from 156 down to 119 upon standing. She also noted to have bradycardia of 43 in the PCPs office. She was sent to cardiology office on the same day for evaluation. Given her persistent symptom and orthostatic hypotension, it was recommended for the patient to get support stocking. As for the bradycardia, outpatient 48-hour Holter monitor was ordered. She presents today for cardiology follow-up. She still has dizziness. Holter monitor result has been reviewed which shows frequent PVCs, lowest heart rate 47,  no episodes of atrial fibrillation. While in the examination room, she was also complaining of dizziness. Her blood pressure today is 110/60. We rechecked the blood pressure 15 minutes later after she sat down, this blood pressure did not change on repeat. Given her positive orthostatic vital sign, I am concerned that her blood pressure tended to drop precipitously upon standing. She recounted recent episode where she got out of the car and started walking when she started having dizziness. Symptom is suspicious for orthostatic hypotension. I will cut back Toprol-XL down to 25 mg daily and valsartan down to 160 mg daily. There is no obvious arrhythmia to account for her dizziness. If she continued to have dizziness despite systolic blood pressure in the 140 to 150s range, she may need to see neurology for further evaluation.   Past Medical History  Diagnosis Date  . GERD (gastroesophageal reflux disease)   . Hypertension   . Hypothyroidism   . Anemia   . Hyperlipidemia   . Sinus arrhythmia   . History of migraine headaches     a. Next atypical symptoms in the past. Patient was started on Neurontin for possible neuropathic origin of her pain.  Marland Kitchen COPD (chronic obstructive pulmonary disease) ([place])     a. PFTs 2011 show FEV1/FVC of 96%  . Heart murmur, systolic     2-D echo in December 2011 showed a normal EF with grade 1 diastolic dysfunction, trivial pulmonary regurgitation and mildly elevated PA pressure at 37 mmHg probably secondary to her COPD.dynamic obstruction-mid cavity obliteration;  b. 02/2012 Echo: EF 60-65%, mild LVH, PASP 8mmHg.  Marland Kitchen CAD (coronary artery disease)     a. LHC  08/23/11: dLM 40-50%, oRI 40%, oCFX 40%, oD1 70% (small and not amenable to PCI).  LM lesion did not appear to be flow limiting.  Medical rx was recommended;  b. Echo 08/23/11: mild LVH, EF 60-65%, grade 1 diast dysfxn, mild BAE, PASP 24;  c. 02/2012  Cath: LM 50d, LAD 50p, D1 70ost, RI 70p, RCA ok->Med Rx;  d. 01/2013  Cardiolite: EF 88, no ischemia/infarct.  . Carotid stenosis     a. dopplers 10/12:  0-39% bilat ICA;  b. 10/2012 U/S: 0-39% bilat, f/u 1 yr (10/2013).  . Chronic bronchitis   . Type II diabetes mellitus ([place])     a. On oral hypoglycemic agents.  . Blood transfusion 1972    "after daughter born, attempted to give me blood; couldn't give it cause my blood was cold" (09/23/2013)  . History of stomach ulcers 1970's  . Polymyalgia rheumatica ([place])   . Diverticulosis of colon (without mention of hemorrhage)   . Benign hypertensive heart and kidney disease with diastolic CHF, NYHA class II and CKD stage III ([place])   . Migraines   . Anginal pain ([place])   . Shortness of breath on exertion     "just related to angina >1 yr ago" (09/23/2013)  . Arthritis     "back, arms, hips" (09/23/2013)    Past Surgical History  Procedure Laterality Date  . Cataract extraction w/ intraocular lens  implant, bilateral Bilateral 1990's  . Total abdominal hysterectomy  1976  . Left heart catheterization with coronary angiogram N/A 03/16/2012    Procedure: LEFT HEART CATHETERIZATION WITH CORONARY ANGIOGRAM;  Surgeon: Hillary Bow, MD;  Location: [facility] CATH LAB;  Service: Cardiovascular;  Laterality: N/A;  . Cardiac catheterization N/A 09/26/2015    Procedure: Right/Left Heart Cath and Coronary Angiography;  Surgeon: Larey Dresser, MD;  Location: [facility] CV LAB;  Service: Cardiovascular;  Laterality: N/A;     Medications: Current Outpatient Prescriptions  Medication Sig Dispense Refill  . acetaminophen (TYLENOL) 500 MG tablet Take 1,000 mg by mouth every 6 (six) hours as needed for headache. For pain    . atorvastatin (LIPITOR) 40 MG tablet Take 1 tablet (40 mg total) by mouth every evening. 90 tablet 3  . clopidogrel (PLAVIX) 75 MG tablet Take 1 tablet (75 mg total) by mouth every evening. 30 tablet 5  . esomeprazole (NEXIUM) 40 MG capsule take 1 capsule by mouth once daily BEFORE BREAKFAST 30 capsule 6  .  fluticasone (FLONASE) 50 MCG/ACT nasal spray Place 1 spray into both nostrils daily. 16 g 2  . Fluticasone-Salmeterol (ADVAIR) 250-50 MCG/DOSE AEPB Inhale 1 puff into the lungs every 12 (twelve) hours as needed (COPD). inhale 1 dose by mouth every 12 hours    . glimepiride (AMARYL) 1 MG tablet take 1 tablet by mouth once daily BEFORE BREAKFAST 90 tablet 3  . glucose blood (ONE TOUCH ULTRA TEST) test strip 1 each by Other route as needed for other. Use 1 to 2 times daily to check blood sugar. diag code 250.02. Non-insulin dependent 100 each 4  . hydroxypropyl methylcellulose (ISOPTO TEARS) 2.5 % ophthalmic solution Place 1 drop into both eyes 2 (two) times daily.     Marland Kitchen imipramine (TOFRANIL) 50 MG tablet Take 1 tablet (50 mg total) by mouth 2 (two) times daily. 180 tablet 3  . isosorbide mononitrate (IMDUR) 60 MG 24 hr tablet Take 1 tablet (60 mg total) by mouth daily. 90 tablet 3  . levothyroxine (SYNTHROID, LEVOTHROID) 25 MCG  tablet Take 1 tablet (25 mcg total) by mouth daily. 90 tablet 1  . loratadine (CLARITIN) 10 MG tablet Take 10 mg by mouth daily as needed for allergies, rhinitis or itching.    . Multiple Vitamin (MULITIVITAMIN WITH MINERALS) TABS Take 1 tablet by mouth daily.    Marland Kitchen NITROSTAT 0.4 MG SL tablet place 1 tablet under the tongue if needed every 5 minutes for chest pain for 3 doses IF NO RELIEF AFTER 3RD DOSE CALL PRESCRIBER OR 911. 25 tablet 3  . ONETOUCH DELICA LANCETS [id number] MISC Inject 1 each into the skin as needed. Use 1 to 2 times daily to check blood sugar. diag code 250.02. Non-insulin dependent 200 each 4  . promethazine (PHENERGAN) 12.5 MG tablet Take 1 tablet (12.5 mg total) by mouth every 6 (six) hours as needed for nausea or vomiting (Prior to pain medications). 30 tablet 0  . metoprolol succinate (TOPROL XL) 25 MG 24 hr tablet Take 1 tablet (25 mg total) by mouth daily. 90 tablet 3  . valsartan (DIOVAN) 160 MG tablet Take 1 tablet (160 mg total) by mouth daily. 90 tablet 3    No current facility-administered medications for this visit.    Allergies: Allergies  Allergen Reactions  . Aspirin Swelling and Other (See Comments)    Angioedema  . Codeine Nausea And Vomiting and Other (See Comments)    "makes me deathly sick" - can take with nausea medicine   . Penicillins Diarrhea    "real bad" Has patient had a PCN reaction causing immediate rash, facial/tongue/throat swelling, SOB or lightheadedness with hypotension: Yes Has patient had a PCN reaction causing severe rash involving mucus membranes or skin necrosis: No Has patient had a PCN reaction that required hospitalization No Has patient had a PCN reaction occurring within the last 10 years: Yes If all of the above answers are "NO", then may proceed with Cephalosporin use.     Social History: The patient  reports that she has never smoked. She has never used smokeless tobacco. She reports that she does not drink alcohol or use illicit drugs.   Family History: The patient's family history includes COPD in her father; Colon cancer in her maternal grandmother; Heart attack in her maternal grandmother; Other in her mother; Pulmonary Hypertension in her daughter.   Review of Systems: Please see the history of present illness.   Otherwise, the review of systems is positive for dizziness.   All other systems are reviewed and negative.   Physical Exam: VS:  BP 110/60 mmHg  Pulse 72  Ht 5\' 4"  (1.626 m)  Wt 130 lb 9.6 oz (59.24 kg)  BMI 22.41 kg/m2  LMP 02/11/1975 .  BMI Body mass index is 22.41 kg/(m^2).  Wt Readings from Last 3 Encounters:  04/19/16 130 lb 9.6 oz (59.24 kg)  04/11/16 131 lb 3.2 oz (59.512 kg)  04/11/16 132 lb 12.8 oz (60.238 kg)    General: Pleasant. Well developed, well nourished and in no acute distress.  HEENT: Normal. Neck: Supple, no JVD, carotid bruits, or masses noted.  Cardiac: Regular rate and rhythm. No rubs, or gallops. No edema.  [id number] systolic murmur Respiratory:   Lungs are clear to auscultation bilaterally with normal work of breathing.  GI: Soft and nontender.  MS: No deformity or atrophy. Gait and ROM intact. Skin: Warm and dry. Color is normal.  Neuro:  Strength and sensation are intact and no gross focal deficits noted.  Psych: Alert, appropriate and with normal affect.  LABORATORY DATA:  EKG:  EKG is not ordered today.  Lab Results  Component Value Date   WBC 4.4 01/27/2016   HGB 12.2 01/27/2016   HCT 38.4 01/27/2016   PLT 251 01/27/2016   GLUCOSE 102* 04/11/2016   CHOL 136 08/05/2014   TRIG 169.0* 08/05/2014   HDL 35.10* 08/05/2014   LDLCALC 67 08/05/2014   ALT 19 09/24/2013   AST 24 09/24/2013   NA 137 04/11/2016   K 3.7 04/11/2016   CL 105 04/11/2016   CREATININE 1.14* 04/11/2016   BUN 19 04/11/2016   CO2 26 04/11/2016   TSH 2.544 04/11/2016   INR 1.05 01/27/2016   HGBA1C 6.8 10/23/2015   MICROALBUR 0.4 12/21/2014    BNP (last 3 results)  Recent Labs  08/28/15 1648  BNP 104.1*      Other Studies Reviewed Today:  CPX 10/24/2015  Conclusion: Exercise testing with gas exchange demonstrates a mild to moderate functional impairment when compared to matched sedentary norms. Pre-exercise spirometry suggests mild obstruction. There appears to be a significant circulatory limitation with severe chronotropic incompetence, a low OUES and a markedly elevated VeVCO22 slope. The VE/VCO2 slope is suggestive of high filling pressures but recent RHC was normal. The possibility of exercise-induced PAH or functional MR is raised. Myocardial ischemia cannot be excluded. If symptoms do not improve with a formal exercise training program consider exercise RHC.    Cath 09/26/2015 Conclusion    1. Low filling pressures, no evidence for CHF as cause of her dyspnea.  2. Stable coronary disease. The 40-50% left main stenosis and the 70-80% D1 stenosis do not appear to have progressed compared to 2013 angiography.  3. I  cannot explain her exertional symptoms on the basis of this procedure. Would consider CPX.     48 hour event monitor  Ventricular events: Single Ventricular beats: 1515 [place]: 0 Couplets: 5 Triplets: 0 Runs: 0 VE tachycardia: 0 Ventricular complexes represent 0.72% of total   Supraventricular events: Total SVE beats: 1179 Atrial Runs: 0 SVE complexes represent 0.56% of total Arrhythmias: Dropped / Late Beats: 49559 - longest: 1.73 seconds at 8:10 AM(2)   Bradycardias: 5 - slowest: 47 bpm at 8:56 AM(1) - longest: 7.31 seconds at 12:26 PM(2) R-R Pause (Delay>2 sec): 0 Duration of atrial fibrillation: 0.00 min, 0.00 % of recording duration.   HEART RATE ST LEVEL Minimum: 58 bpm at 4:05 AM(2) mV at Mean: 79 bpm mV Maximum: 105 bpm at 1:20 PM(1) mV at QRS Complexes: [id number] Artifacts: 0 QRS Criteria: Normal Sensitivity Ch1: Medium Ch2: Medium  Predominant Rhythm: Chaotic Atrial Rhythm   Assessment/Plan:  1. Dizziness due to orthostatic hypotension  - BB recently cut back to 50mg . Imdur decreased to 60mg  daily on 5/25. Recommended support stocking. Plan to cut Toprol XL to 25mg  daily. Cut Valsartan to 160mg  daily. Monitor BP daily, goal SBP 140-150s. If continue to have dizziness despite good BP, may need to be seen by neurology  - encourage hydration, esp during summer. She has an known aortic systolic murmur, but last echo 6 month ago did not show significant aortic valve issue, doubt significant aortic valve stenosis at this time, may consider repeat echo in 1 year.  2. Bradycardia: 48 hr holter monitor shows PVC and slowest HR 47, no significant pause, no afib. Current HR 72.   3. CAD   - Non-flow limiting, moderate disease on cath in 11/16 including 40-50% distal LM . Nand the 70-80% D1 stenosis do not appear to have  progressed compared to 2013 angiography. No ischemia or infarction on Lexiscan Cardiolite in 10/16  - Echo 09/19/2015 EF 60-65%, no RWMA  - CXP 12/24/2014 gas exchange  demonstrates mild to moderate functional impairment when compared to matched norms, preexercise spirometry suggests mild obstruction, significant circulatory limitation with severe chronotropic incompetence.    4. HTN: BP 110/60 today, given her orthostatic hypotension, this is likely low for her.   5. HLD: repeat lipid next followup  6. DM II: she is on Amaryl     Current medicines are reviewed with the patient today.  The patient does not have concerns regarding medicines other than what has been noted above.  The following changes have been made:  See above.  Labs/ tests ordered today include:   No orders of the defined types were placed in this encounter.     Disposition:   FU with Dr. Aundra Dubin in 3 months.   Patient is agreeable to this plan and will call if any problems develop in the interim.   Signed: Almyra Deforest PA-C 04/19/2016 10:34 AM  [facility] [address] [place], [place]  [address] Phone: [phone] Fax: [phone]

## 2016-04-22 ENCOUNTER — Telehealth: Payer: Self-pay | Admitting: *Deleted

## 2016-04-22 NOTE — Telephone Encounter (Signed)
Patient called into office. She stated she wanted to speak to  Dr Claris Gladden  Nurse Patient she has some information to give to her.

## 2016-04-22 NOTE — Telephone Encounter (Signed)
Pt states she wants to go to [place] with a friend for about 3 days, pt would not be driving. Pt states she is feeling better and dizziness has improved. Pt states her friend suggested pt ask Dr Aundra Dubin if okay to go to [facility] due to higher elevation.  Pt advised I will forward to Dr Aundra Dubin for review.

## 2016-04-22 NOTE — Telephone Encounter (Signed)
Pt.notified

## 2016-04-22 NOTE — Telephone Encounter (Signed)
Ok as long as her symptoms are stable.

## 2016-04-24 ENCOUNTER — Encounter: Payer: Medicare Other | Admitting: Internal Medicine

## 2016-05-07 ENCOUNTER — Telehealth: Payer: Self-pay | Admitting: Cardiology

## 2016-05-07 NOTE — Telephone Encounter (Signed)
Pt states she has had a headache since Thursday, relieved with Tylenol.  Pt advised I will forward to Dr Aundra Dubin for review.

## 2016-05-07 NOTE — Telephone Encounter (Signed)
LMTCB

## 2016-05-07 NOTE — Telephone Encounter (Signed)
New message   Pt c/o BP issue: STAT if pt c/o blurred vision, one-sided weakness or slurred speech  1. What are your last 5 BP readings? 172/70 this am  2. Are you having any other symptoms (ex. Dizziness, headache, blurred vision, passed out)? Headache on Friday  3. What is your BP issue? Concerns of a high pressure and the pt keeps taking Tyenol

## 2016-05-07 NOTE — Telephone Encounter (Signed)
Given recent symptoms, would accept those BP readings.

## 2016-05-07 NOTE — Telephone Encounter (Signed)
Pt advised no new recommendations per Dr Aundra Dubin.

## 2016-05-07 NOTE — Telephone Encounter (Signed)
Pt states BP about 30 minutes ago was 170/96 recheck 165/76. Friday BP 146/92 HR 73, Sat 132/62 HR 104 Mon 177/87 HR 96. Other BP readings from last week-- 170/87 153/76 153/83 169/80 131/65 171/93

## 2016-05-08 ENCOUNTER — Encounter: Payer: Self-pay | Admitting: Internal Medicine

## 2016-05-08 ENCOUNTER — Ambulatory Visit (INDEPENDENT_AMBULATORY_CARE_PROVIDER_SITE_OTHER): Payer: Medicare Other | Admitting: Internal Medicine

## 2016-05-08 VITALS — BP 134/61 | HR 70 | Temp 98.2°F | Ht 64.0 in | Wt 130.6 lb

## 2016-05-08 DIAGNOSIS — H9313 Tinnitus, bilateral: Secondary | ICD-10-CM | POA: Diagnosis not present

## 2016-05-08 DIAGNOSIS — H35033 Hypertensive retinopathy, bilateral: Secondary | ICD-10-CM

## 2016-05-08 DIAGNOSIS — M8580 Other specified disorders of bone density and structure, unspecified site: Secondary | ICD-10-CM

## 2016-05-08 DIAGNOSIS — E785 Hyperlipidemia, unspecified: Secondary | ICD-10-CM | POA: Diagnosis not present

## 2016-05-08 DIAGNOSIS — R143 Flatulence: Secondary | ICD-10-CM | POA: Diagnosis not present

## 2016-05-08 DIAGNOSIS — Z79899 Other long term (current) drug therapy: Secondary | ICD-10-CM | POA: Diagnosis not present

## 2016-05-08 DIAGNOSIS — I120 Hypertensive chronic kidney disease with stage 5 chronic kidney disease or end stage renal disease: Secondary | ICD-10-CM

## 2016-05-08 DIAGNOSIS — R911 Solitary pulmonary nodule: Secondary | ICD-10-CM | POA: Diagnosis not present

## 2016-05-08 DIAGNOSIS — G5600 Carpal tunnel syndrome, unspecified upper limb: Secondary | ICD-10-CM

## 2016-05-08 DIAGNOSIS — E119 Type 2 diabetes mellitus without complications: Secondary | ICD-10-CM

## 2016-05-08 DIAGNOSIS — N3946 Mixed incontinence: Secondary | ICD-10-CM | POA: Diagnosis not present

## 2016-05-08 DIAGNOSIS — G5603 Carpal tunnel syndrome, bilateral upper limbs: Secondary | ICD-10-CM

## 2016-05-08 DIAGNOSIS — E1122 Type 2 diabetes mellitus with diabetic chronic kidney disease: Secondary | ICD-10-CM

## 2016-05-08 DIAGNOSIS — I10 Essential (primary) hypertension: Secondary | ICD-10-CM

## 2016-05-08 DIAGNOSIS — R42 Dizziness and giddiness: Secondary | ICD-10-CM | POA: Diagnosis not present

## 2016-05-08 DIAGNOSIS — N183 Chronic kidney disease, stage 3 unspecified: Secondary | ICD-10-CM

## 2016-05-08 DIAGNOSIS — Z7984 Long term (current) use of oral hypoglycemic drugs: Secondary | ICD-10-CM | POA: Diagnosis not present

## 2016-05-08 LAB — POCT GLYCOSYLATED HEMOGLOBIN (HGB A1C): Hemoglobin A1C: 6.9

## 2016-05-08 LAB — GLUCOSE, CAPILLARY: GLUCOSE-CAPILLARY: 142 mg/dL — AB (ref 65–99)

## 2016-05-08 MED ORDER — SIMETHICONE 80 MG PO CHEW: 80.0000 mg | CHEWABLE_TABLET | Freq: Four times a day (QID) | ORAL | Status: DC | PRN

## 2016-05-08 NOTE — Patient Instructions (Signed)
Ms. Kren - -  It was a pleasure to see you today!  For your flatulence, try Gas-X over the counter (more information below) after you have the symptoms.  You could also try Bean-O prior to a high fiber meal.   Please come back and see me in 4 months for your diabetes.    Please follow up with your cardiologist as planned.    Thank you!  Please call with questions.   Simethicone chewable tablets (GAS-X) What is this medicine? SIMETHICONE (sye METH i kone) is used to decrease the discomfort caused by gas. This medicine may be used for other purposes; ask your health care provider or pharmacist if you have questions. What should I tell my health care provider before I take this medicine? They need to know if you have any of these conditions: -phenylketonuria -an unusual or allergic reaction to simethicone, other medicines, foods, dyes, or preservatives -pregnant or trying to get pregnant -breast-feeding How should I use this medicine? Take this medicine by mouth. Crush or chew the tablets. Do not swallow them whole. Follow the directions on the label or those given to you by your doctor or health care professional. Do not take your medicine more often than directed. Talk to your pediatrician regarding the use of this medicine in children. While this medicine may be used in children as young as 12 years for selected conditions, precautions do apply. Overdosage: If you think you have taken too much of this medicine contact a poison control center or emergency room at once. NOTE: This medicine is only for you. Do not share this medicine with others. What if I miss a dose? This does not apply. You will only use this medicine as needed for gas pain. Do not use double or extra doses. What may interact with this medicine? Interactions are not expected. This list may not describe all possible interactions. Give your health care provider a list of all the medicines, herbs, non-prescription  drugs, or dietary supplements you use. Also tell them if you smoke, drink alcohol, or use illegal drugs. Some items may interact with your medicine. What should I watch for while using this medicine? Tell your doctor or health care professional if your symptoms get worse, or if you have severe pain, diarrhea, constipation, or blood in your stool. These could be signs of a more serious condition. What side effects may I notice from receiving this medicine? There are no reported side effects of this medicine. This list may not describe all possible side effects. Call your doctor for medical advice about side effects. You may report side effects to FDA at 1-800-FDA-1088. Where should I keep my medicine? Keep out of the reach of children. Store at room temperature between 15 and 30 degrees C (59 and 86 degrees F). Keep container tightly closed. Throw away any unused medicine after the expiration date. NOTE: This sheet is a summary. It may not cover all possible information. If you have questions about this medicine, talk to your doctor, pharmacist, or health care provider.    2016, Elsevier/Gold Standard. (2008-07-06 15:45:11)

## 2016-05-08 NOTE — Progress Notes (Signed)
Subjective:    [name], female    DOB: 01/24/1934, 82 y.o.   MRN: [id number]  CC: 6 month follow up for DM.   HPI   [name] is an 82yo woman with PMH of DM2, allergies, carpal tunnel syndrome, CAD, HTN, osteopenia who presents today for routine follow up.   [name] reports that her dizziness has improved.  She only has had a few episodes since decresing her medications.  She thinks she is taking her meds correctly.  She forgot to take her metoprol this morning.  BPs checked in the clinic showed no orthostasis (Lying 139/53, pulse 60; sitting 134/61, pulse 70; standing 140/67, pulse 58)  She reports a new symptom which is bilateral tinnitus.  She reports it sounds like a ringing in her ears, low pitched. She does not remember a specific trigger.  She notes that she always has some low lying tinnitus which does not bother her, but occasionally she will have worsening of her symptoms for a few days which then resolve.  She notes that this has been going on for > 30 years and waxes/wanes.  She does not have a history of working around loud noises, but she has seen an audiologist who told her it was likely due to her hearing loss.  She wears hearing aids.    She has occasional urinary incontinence.  She has a history of having nine children.  She used to lose urine when she sneezed and cough.  Now with urge incontinence and having trouble making it to the restroom on time.    Increased flatulence - oatmeal for breakfast, grits, eggs, cheerios, bananas.  Sandwich for lunch.  Chicken, rice, green beans.  Uses fiber bread.  Oatmeal fiber bars.  Simethicone today.   DM is well controlled, A1C of 6.9.  BS log reviewed showed blood sugar average in the 80s-140s in the mornings and below 140 throughout the day.  She has HTN, but orthostatic vitals showed well controlled blood pressure despite new lower doses of medications.    She brought in a list of her medications.  She was very  aware of her new doses of anti-hypertensives.  She is not smoking.    Review of Systems  Constitutional: Negative for fever and unexpected weight change.  HENT: Positive for hearing loss and tinnitus. Negative for ear discharge and ear pain.   Respiratory: Negative for cough, choking and shortness of breath.   Cardiovascular: Negative for chest pain and leg swelling.  Gastrointestinal: Negative for abdominal pain, diarrhea, constipation and abdominal distention.       Increased flatulence  Genitourinary: Positive for frequency and enuresis. Negative for dysuria, flank pain and difficulty urinating.  Skin: Negative for rash and wound.  Neurological: Negative for dizziness and light-headedness.       Objective:   Physical Exam  Constitutional: She is oriented to person, place, and time. She appears well-developed and well-nourished. No distress.  HENT:  Head: Normocephalic and atraumatic.  Mouth/Throat: Oropharynx is clear and moist.  Both ears with impacted wax, TM not seen  Neck: Neck supple.  Cardiovascular: Normal rate, regular rhythm, normal heart sounds and intact distal pulses.   Pulmonary/Chest: Effort normal and breath sounds normal. No respiratory distress. She has no wheezes.  Abdominal: Soft. She exhibits no distension. There is no tenderness.  Musculoskeletal: She exhibits no edema or tenderness.  Lymphadenopathy:    She has no cervical adenopathy.  Neurological: She is alert and  oriented to person, place, and time.  Skin: Skin is warm and dry.  Psychiatric: She has a normal mood and affect. Her behavior is normal.          Assessment & Plan:  RTC in 4 months for DM follow up.   Tinnitus bilateral ears Chronic in nature, waxing and waning.  No concerning findings on HENT exam today.  Would consider debrox drops in the future if she is amenable.  Monitor at next visit.   Flatulence Trial of simethicone before meals.  She has no red flag symptoms at this time,  continues to have daily or every other day BM.

## 2016-05-09 DIAGNOSIS — N3946 Mixed incontinence: Secondary | ICD-10-CM | POA: Insufficient documentation

## 2016-05-09 LAB — BMP8+ANION GAP
Anion Gap: 15 mmol/L (ref 10.0–18.0)
BUN / CREAT RATIO: 15 (ref 12–28)
BUN: 15 mg/dL (ref 8–27)
CO2: 24 mmol/L (ref 18–29)
CREATININE: 1.01 mg/dL — AB (ref 0.57–1.00)
Calcium: 9.6 mg/dL (ref 8.7–10.3)
Chloride: 101 mmol/L (ref 96–106)
GFR calc non Af Amer: 53 mL/min/{1.73_m2} — ABNORMAL LOW (ref >59)
GFR, EST AFRICAN AMERICAN: 61 mL/min/{1.73_m2} (ref >59)
Glucose: 79 mg/dL (ref 65–99)
Potassium: 4.1 mmol/L (ref 3.5–5.2)
SODIUM: 140 mmol/L (ref 134–144)

## 2016-05-09 LAB — LIPID PANEL
CHOLESTEROL TOTAL: 136 mg/dL (ref 100–199)
Chol/HDL Ratio: 3.4 ratio units (ref 0.0–4.4)
HDL: 40 mg/dL (ref >39)
LDL CALC: 55 mg/dL (ref 0–99)
Triglycerides: 207 mg/dL — ABNORMAL HIGH (ref 0–149)
VLDL Cholesterol Cal: 41 mg/dL — ABNORMAL HIGH (ref 5–40)

## 2016-05-09 NOTE — Assessment & Plan Note (Signed)
BMET today shows a CR of 1.01 and a GFR of 61, which is right around her baseline.   Plan: Continue good BP and DM control.  Monitor yearly, more often as needed.

## 2016-05-09 NOTE — Assessment & Plan Note (Signed)
Highest BP 140/67 today.  She is on a new regimen of blood pressure medications including IMDUR, metoprolol succinate, valsartan at lower doses and she is doing well. Renal function is at baseline based on todays labs.   Continue current medications: Imdur 60mg  daily, metoprolol succinate 25mg  daily, valsartan 174md daily.

## 2016-05-09 NOTE — Assessment & Plan Note (Addendum)
Last DEXA in 2014.  Based on some guidelines she is due for repeat DEXA, and needs encouragement to take calcium and vitamin d.   Plan: DEXA order at next visit.

## 2016-05-09 NOTE — Assessment & Plan Note (Signed)
Based on symptoms, I think she has a mixed picture.  Because of her orthostasis I hesitate to start a medication, so we discussed timed toileting today.  She said she would give it a try.

## 2016-05-09 NOTE — Assessment & Plan Note (Signed)
Thought to be due to orthostatic hypotension.  Recent medication changes include:  Metoprolol to 25mg  Imdur decreased to 60mg  daily on 5/25.  Valsartan to 160mg  daily.   She reports she is taking these as noted above.  She missed the metoprolol today but plans to take when she gets home.   Her dizziness is much improved today.  No need for further work up.  BP is also well controlled.  Reviewed recent Cardiology notes.

## 2016-05-09 NOTE — Assessment & Plan Note (Signed)
Following with rheumatology who give her injections PRN for pain.   Assessment: Continue follow up with rheumatology.

## 2016-05-09 NOTE — Assessment & Plan Note (Signed)
She is following closely with an eye doctor, most recently April of this year.   Plan: continue eye exams, continue to monitor.

## 2016-05-09 NOTE — Assessment & Plan Note (Signed)
She reports that she is doing well today.  She has no low blood sugars to report, no signs/symptoms of elevated blood sugar.  She is taking her glimepiride as prescribed.   A1C  6.9 LDL 67 - recheck lipid panel today Cr 1.14, MAU/Cr 7.6 at goal Eye on 4/17 - hypertensive retinopathy Foot - due today, good feeling, good pulses.  BP: Well controlled  Continue current therapy including glimepiride and checking blood sugars as she has been doing.

## 2016-05-09 NOTE — Assessment & Plan Note (Signed)
CT chest done this year with reading by Dr. Elmer Bales BILATERAL nonspecific pulmonary nodules up to 4 mm diameter, many stable, several not definitely visualized on the previous study; recommendations below.  If the patient is at high risk for bronchogenic carcinoma, follow-up chest CT at 1 year is recommended. If the patient is at low risk, no follow-up is needed. This recommendation follows the consensus statement: Guidelines for Management of Small Pulmonary Nodules Detected on CT Scans: A Statement from the [place] as published in Radiology 2005; 237:395-400.  Christine [name] and I discussed these results.  She is a never smoker, but has obstructive lung disease on recent PFTs.  She reports wood smoke exposure as a child.  Consider 1 time repeat CT scan in the future.  Decision requires further discussion with Christine [name] and what expectations she would have if anything were to be worsening or changed.

## 2016-05-29 ENCOUNTER — Telehealth: Payer: Self-pay

## 2016-05-29 NOTE — Telephone Encounter (Signed)
Needs to speak with a nurse regarding invokana med.

## 2016-05-29 NOTE — Telephone Encounter (Signed)
Called, unable to leave vmail due to mailbox not set up

## 2016-05-29 NOTE — Telephone Encounter (Signed)
Calling back again, requesting the nurse to speak with a nurse.

## 2016-06-11 NOTE — Telephone Encounter (Signed)
Pt was calling about her test strips and it is resolved

## 2016-07-02 ENCOUNTER — Ambulatory Visit (INDEPENDENT_AMBULATORY_CARE_PROVIDER_SITE_OTHER): Payer: Medicare Other | Admitting: Internal Medicine

## 2016-07-02 ENCOUNTER — Encounter: Payer: Self-pay | Admitting: Internal Medicine

## 2016-07-02 VITALS — BP 156/63 | Temp 98.4°F | Wt 127.9 lb

## 2016-07-02 DIAGNOSIS — J209 Acute bronchitis, unspecified: Secondary | ICD-10-CM

## 2016-07-02 DIAGNOSIS — I10 Essential (primary) hypertension: Secondary | ICD-10-CM | POA: Diagnosis not present

## 2016-07-02 MED ORDER — PREDNISONE 20 MG PO TABS: 40.0000 mg | ORAL_TABLET | Freq: Every day | ORAL | 0 refills | Status: DC

## 2016-07-02 MED ORDER — GUAIFENESIN-DM 100-10 MG/5ML PO SYRP: 5.0000 mL | ORAL_SOLUTION | ORAL | 0 refills | Status: DC | PRN

## 2016-07-02 MED ORDER — AZITHROMYCIN 250 MG PO TABS: 250.0000 mg | ORAL_TABLET | Freq: Every day | ORAL | 0 refills | Status: DC

## 2016-07-02 NOTE — Patient Instructions (Addendum)
TAKE AZITHROMYCIN 2 PILLS ON DAY ONE AND THEN ONE PILL A DAY FOR FOUR MORE DAYS.  TAKE PREDNISONE 40 MG (2 PILLS) PER DAY FOR 5 DAYS.  CONTINUE TO USE FLONASE.  USE ROBITUSSIN DM EVERY 4 HOURS AS NEEDED FOR YOUR COUGH.   GET PLENTY OF REST AND DRINK FLUIDS.   RETURN IF YOUR SYMPTOMS ARE NOT IMPROVING OR ARE WORSENING.

## 2016-07-02 NOTE — Assessment & Plan Note (Signed)
BP Readings from Last 3 Encounters:  07/02/16 (!) 156/63  05/08/16 134/61  04/19/16 110/60    Lab Results  Component Value Date   NA 140 05/08/2016   K 4.1 05/08/2016   CREATININE 1.01 (H) 05/08/2016    Assessment: Blood pressure control:  Uncontrolled Progress toward BP goal:   Deteriorated Comments: Medications recently changed due to orthostatic hypotension to Toprol 25 mg QD and Valsartan 160 mg daily. Lightheadedness has improved. Goal SBP is 140-150.  Plan: Medications:  continue current medications Other plans: If persistently elevated, will need to increase either Toprol or Valsartan and monitor for symptoms.

## 2016-07-02 NOTE — Progress Notes (Signed)
CC: COUGH  HPI: [name] is a 82 y.o. female with PMHx of HTN, CAD, COPD, and Hypothyroidism who presents to the clinic for complaint of cough.  Patient states that 10 days ago she developed a productive cough of clear sputum associated with increased shortness of breath and wheezing. She denies associated nasal congestion, sore throat, nausea, vomiting, diarrhea. She admits to chest pain with cough, but denies pleuritic or exertional chest pain. She admits to a sick contact- daughter. She has tried flonase, decongestant, cough syrup without relief. She has been using her Advair and Albuterol inhalers. She feels that her symptoms are getting worse, not better.    Past Medical History:  Diagnosis Date  . Anemia   . Anginal pain ([place])   . Arthritis    "back, arms, hips" (09/23/2013)  . Benign hypertensive heart and kidney disease with diastolic CHF, NYHA class II and CKD stage III ([place])   . Blood transfusion 1972   "after daughter born, attempted to give me blood; couldn't give it cause my blood was cold" (09/23/2013)  . CAD (coronary artery disease)    a. LHC 08/23/11: dLM 40-50%, oRI 40%, oCFX 40%, oD1 70% (small and not amenable to PCI).  LM lesion did not appear to be flow limiting.  Medical rx was recommended;  b. Echo 08/23/11: mild LVH, EF 60-65%, grade 1 diast dysfxn, mild BAE, PASP 24;  c. 02/2012  Cath: LM 50d, LAD 50p, D1 70ost, RI 70p, RCA ok->Med Rx;  d. 01/2013 Cardiolite: EF 88, no ischemia/infarct.  . Carotid stenosis    a. dopplers 10/12:  0-39% bilat ICA;  b. 10/2012 U/S: 0-39% bilat, f/u 1 yr (10/2013).  . Chronic bronchitis   . COPD (chronic obstructive pulmonary disease) ([place])    a. PFTs 2011 show FEV1/FVC of 96%  . Diverticulosis of colon (without mention of hemorrhage)   . GERD (gastroesophageal reflux disease)   . Heart murmur, systolic    2-D echo in December 2011 showed a normal EF with grade 1 diastolic dysfunction, trivial pulmonary regurgitation and  mildly elevated PA pressure at 37 mmHg probably secondary to her COPD.dynamic obstruction-mid cavity obliteration;  b. 02/2012 Echo: EF 60-65%, mild LVH, PASP 46mmHg.  Marland Kitchen History of migraine headaches    a. Next atypical symptoms in the past. Patient was started on Neurontin for possible neuropathic origin of her pain.  Marland Kitchen History of stomach ulcers 1970's  . Hyperlipidemia   . Hypertension   . Hypothyroidism   . Migraines   . Polymyalgia rheumatica ([place])   . Shortness of breath on exertion    "just related to angina >1 yr ago" (09/23/2013)  . Sinus arrhythmia   . Type II diabetes mellitus ([place])    a. On oral hypoglycemic agents.    Review of Systems: A complete ROS was negative except as noted in HPI.   Physical Exam: Vitals:   07/02/16 0816  BP: (!) 156/63  Temp: 98.4 F (36.9 C)  TempSrc: Oral  SpO2: 100%  Weight: 127 lb 14.4 oz (58 kg)   General: Vital signs reviewed.  Patient is elderly, in no acute distress and cooperative with exam.  HEENT: Conjunctivae normal, no scleral icterus, PERRLA. Normal posterior oropharynx. Cardiovascular: RRR Pulmonary/Chest: Clear to auscultation bilaterally, no wheezes, rales, or rhonchi. Abdominal: Soft, non-tender, non-distended, BS + Extremities: No lower extremity edema bilaterally Skin: Warm, dry and intact. No rashes or erythema. Psychiatric: Normal mood and affect. speech and behavior is normal. Cognition  and memory are grossly normal.   Assessment & Plan:  See encounters tab for problem based medical decision making. Patient discussed with Dr. Eppie Gibson

## 2016-07-02 NOTE — Progress Notes (Signed)
[name], female   DOB: 01/24/1934, 82 y.o.   MRN: [id number]  Case discussed with Dr. Quay Burow at the time of the visit. We reviewed the resident's history and exam and pertinent patient test results. I agree with the assessment, diagnosis, and plan of care documented in the resident's note.  The patient does not have COPD per PFTs in 2016.  I think she is presenting with a viral bronchitis that she likely got from her daughter.  As she is not improving with conservative therapy it is reasonable to treat with a course of antibiotics and a quick steroid burst with taper.

## 2016-07-02 NOTE — Assessment & Plan Note (Addendum)
Patient states that 10 days ago she developed a productive cough of clear sputum associated with increased shortness of breath and wheezing. She denies associated nasal congestion, sore throat, nausea, vomiting, diarrhea. She admits to chest pain with cough, but denies pleuritic or exertional chest pain. She admits to a sick contact- daughter. She has tried flonase, decongestant, cough syrup without relief. She has been using her Advair and Albuterol inhalers. She feels that her symptoms are getting worse, not better.  Given length of symptoms, I feel it is appropriate to treat outpatient as an acute bronchitis.   Plan: -Prednisone 40 mg daily for 5 days for airway inflammation -Azithromycin 500 mg x 1 day, followed by 250 mg for 4 days -Robitussin DM Q4H prn  -Continue Flonase, Advair BID and Albuterol inhalers -Continue supportive care, return if no improvement

## 2016-07-03 ENCOUNTER — Encounter: Payer: Self-pay | Admitting: Student-PharmD

## 2016-07-03 ENCOUNTER — Telehealth: Payer: Self-pay | Admitting: Student-PharmD

## 2016-07-03 NOTE — Telephone Encounter (Signed)
Attempted to call patient for steroid/DM check up. Patient answered on first attempt but could not hear the pt and pt ended call. Upon second attempt, pt did not answer. Will f/u tomorrow.  Terald Sleeper PharmD Candidate, c/o 2019 07/03/2016 3:50 PM

## 2016-07-26 ENCOUNTER — Ambulatory Visit: Payer: Medicare Other | Admitting: Cardiology

## 2016-08-05 ENCOUNTER — Telehealth: Payer: Self-pay | Admitting: Gastroenterology

## 2016-08-05 ENCOUNTER — Other Ambulatory Visit: Payer: Self-pay | Admitting: Internal Medicine

## 2016-08-05 NOTE — Telephone Encounter (Signed)
Pt requesting refills on Nexium 40 mg one a day. Has a follow up office visit to establish care on 09-24-2016.

## 2016-08-06 MED ORDER — ESOMEPRAZOLE MAGNESIUM 40 MG PO CPDR: DELAYED_RELEASE_CAPSULE | ORAL | 1 refills | Status: DC

## 2016-08-06 NOTE — Telephone Encounter (Signed)
Yes, than will be fine.  One month supply with one refill.  Must come to appt to re-evaluate, not seen here since 03/2014

## 2016-08-06 NOTE — Telephone Encounter (Signed)
Refill as directed by Dr Loletha Carrow

## 2016-08-14 ENCOUNTER — Encounter: Payer: Self-pay | Admitting: *Deleted

## 2016-08-16 ENCOUNTER — Other Ambulatory Visit: Payer: Self-pay | Admitting: Internal Medicine

## 2016-08-28 DIAGNOSIS — G5601 Carpal tunnel syndrome, right upper limb: Secondary | ICD-10-CM | POA: Diagnosis not present

## 2016-08-28 DIAGNOSIS — M19041 Primary osteoarthritis, right hand: Secondary | ICD-10-CM | POA: Diagnosis not present

## 2016-08-28 DIAGNOSIS — R634 Abnormal weight loss: Secondary | ICD-10-CM | POA: Diagnosis not present

## 2016-08-28 DIAGNOSIS — M19042 Primary osteoarthritis, left hand: Secondary | ICD-10-CM | POA: Diagnosis not present

## 2016-09-09 ENCOUNTER — Other Ambulatory Visit: Payer: Self-pay | Admitting: Internal Medicine

## 2016-09-11 ENCOUNTER — Other Ambulatory Visit: Payer: Self-pay | Admitting: Internal Medicine

## 2016-09-16 ENCOUNTER — Telehealth: Payer: Self-pay | Admitting: Cardiology

## 2016-09-16 ENCOUNTER — Telehealth: Payer: Self-pay | Admitting: Internal Medicine

## 2016-09-16 ENCOUNTER — Telehealth: Payer: Self-pay

## 2016-09-16 NOTE — Telephone Encounter (Signed)
Pt advised to take valsartan 160mg  now, continue to monitor BP and follow up with PCP/Urgent Care today. Pt states she has had a cold and a sinus infection.

## 2016-09-16 NOTE — Telephone Encounter (Signed)
Agree with plan, avoid stimulant cold meds with elevated BP.  Call in 1 week with daily BP readings.

## 2016-09-16 NOTE — Telephone Encounter (Signed)
I will forward to Dr McLean for review 

## 2016-09-16 NOTE — Telephone Encounter (Signed)
APT. REMINDER CALL, NO ANSWER, NO VOICEMAIL °

## 2016-09-16 NOTE — Telephone Encounter (Signed)
I reviewed Dr Claris Gladden recommendations with pt. Pt rechecked her BP while on the phone with me--153/78, HR 64. Pt states she has an appt with her PCP tomorrow at 8:45AM. Pt advised to continue to take and record BP and call in 1 week with the readings.

## 2016-09-16 NOTE — Telephone Encounter (Addendum)
Called pt, has appt in am in [facility], she also called her cardiologist and he increased diovan and f/u w/ pcp

## 2016-09-16 NOTE — Telephone Encounter (Signed)
Pt needs to speak with a nurse regarding high blood pressure.

## 2016-09-16 NOTE — Telephone Encounter (Signed)
Pt states she took medication this morning. Pt states she has been in the bed most of the time since Friday, pt complains of headache, dizziness, and nausea today. Pt is going to recheck her BP and I will call her back in 5-10 minutes.

## 2016-09-16 NOTE — Telephone Encounter (Signed)
Agree with plan. Thank you

## 2016-09-16 NOTE — Telephone Encounter (Signed)
Pt c/o BP issue: STAT if pt c/o blurred vision, one-sided weakness or slurred speech  1. What are your last 5 BP readings?saturday :191/87,sunday:182/94,  Monday :192/93  2. Are you having any other symptoms (ex. Dizziness, headache, blurred vision, passed out)?Having Headaches since Friday , really bad on Saturday Morning, Dizziness  3. What is your BP issue? Elevated Blood Pressure

## 2016-09-16 NOTE — Telephone Encounter (Signed)
Pt states BP has been elevated Friday.   Pt states at [place] today BP 192/93, heart rate 63.

## 2016-09-16 NOTE — Telephone Encounter (Signed)
Recheck BP 185/85 , heart rate 82.

## 2016-09-16 NOTE — Telephone Encounter (Signed)
Reviewed with Dr Marlou Porch: Per Dr Jacqualine Mau valsartan 160mg  now, follow up with PCP today for further evaluation of symptoms.

## 2016-09-17 ENCOUNTER — Ambulatory Visit (INDEPENDENT_AMBULATORY_CARE_PROVIDER_SITE_OTHER): Payer: Medicare Other | Admitting: Internal Medicine

## 2016-09-17 VITALS — BP 121/56 | HR 83 | Temp 97.8°F | Ht 64.0 in | Wt 122.1 lb

## 2016-09-17 DIAGNOSIS — R0981 Nasal congestion: Secondary | ICD-10-CM

## 2016-09-17 DIAGNOSIS — N183 Chronic kidney disease, stage 3 unspecified: Secondary | ICD-10-CM

## 2016-09-17 DIAGNOSIS — R51 Headache: Secondary | ICD-10-CM

## 2016-09-17 DIAGNOSIS — I129 Hypertensive chronic kidney disease with stage 1 through stage 4 chronic kidney disease, or unspecified chronic kidney disease: Secondary | ICD-10-CM

## 2016-09-17 DIAGNOSIS — R748 Abnormal levels of other serum enzymes: Secondary | ICD-10-CM

## 2016-09-17 DIAGNOSIS — R110 Nausea: Secondary | ICD-10-CM | POA: Diagnosis not present

## 2016-09-17 DIAGNOSIS — Z79899 Other long term (current) drug therapy: Secondary | ICD-10-CM

## 2016-09-17 DIAGNOSIS — I10 Essential (primary) hypertension: Secondary | ICD-10-CM | POA: Diagnosis not present

## 2016-09-17 DIAGNOSIS — R42 Dizziness and giddiness: Secondary | ICD-10-CM | POA: Diagnosis not present

## 2016-09-17 LAB — GLUCOSE, CAPILLARY: GLUCOSE-CAPILLARY: 119 mg/dL — AB (ref 65–99)

## 2016-09-17 NOTE — Patient Instructions (Signed)
Christine [name] it was nice meeting you today.  -Continue taking Metoprolol 25 mg daily and Valsartan 160 mg daily   -DO NOT take any over-the-counter cough/ cold medicines  -Return to the clinic in 1 week for a blood pressure recheck. If you do not feel well, please return sooner.

## 2016-09-18 ENCOUNTER — Ambulatory Visit: Payer: Medicare Other | Admitting: Internal Medicine

## 2016-09-18 DIAGNOSIS — R748 Abnormal levels of other serum enzymes: Secondary | ICD-10-CM | POA: Insufficient documentation

## 2016-09-18 LAB — CMP14 + ANION GAP
A/G RATIO: 1.6 (ref 1.2–2.2)
ALBUMIN: 4.4 g/dL (ref 3.5–4.7)
ALK PHOS: 159 IU/L — AB (ref 39–117)
ALT: 20 IU/L (ref 0–32)
ANION GAP: 17 mmol/L (ref 10.0–18.0)
AST: 20 IU/L (ref 0–40)
BILIRUBIN TOTAL: 0.4 mg/dL (ref 0.0–1.2)
BUN / CREAT RATIO: 17 (ref 12–28)
BUN: 18 mg/dL (ref 8–27)
CHLORIDE: 94 mmol/L — AB (ref 96–106)
CO2: 23 mmol/L (ref 18–29)
Calcium: 10.1 mg/dL (ref 8.7–10.3)
Creatinine, Ser: 1.09 mg/dL — ABNORMAL HIGH (ref 0.57–1.00)
GFR calc Af Amer: 55 mL/min/{1.73_m2} — ABNORMAL LOW (ref >59)
GFR calc non Af Amer: 48 mL/min/{1.73_m2} — ABNORMAL LOW (ref >59)
GLOBULIN, TOTAL: 2.7 g/dL (ref 1.5–4.5)
Glucose: 116 mg/dL — ABNORMAL HIGH (ref 65–99)
POTASSIUM: 4.1 mmol/L (ref 3.5–5.2)
SODIUM: 134 mmol/L (ref 134–144)
Total Protein: 7.1 g/dL (ref 6.0–8.5)

## 2016-09-18 NOTE — Assessment & Plan Note (Addendum)
Pt c/o dizziness and nausea for the past 1 week. Denies having any fevers, chills, or episodes of vomiting. States she is able to tolerate PO intake but has a reduced appetite. Neuro exam non-focal. CBG 119 and borderline orthostatic. It is noted patient has lost 8 lbs since June 2017. Labs showing elevated ALP with normal bilirubin, AST, and ALT. Serum calcium is normal. Elevated ALP and weight loss bring up concern for possible malignancy.   -Advised patient to stay hydrated -GGT pending to differentiate between intrahepatic vs extrahepatic source of elevated ALP. If GGT is normal, next step would be to check PTH and vitamin D level. If these come back normal, order bone scintigraphy.   Addendum 09/19/16: GGT normal. Chart review shows her ALP has been intermittently elevated since 2007. This elevation could likely be 2/2 vit. D deficiency. She does have a history of osteopenia.  -Check vit. D and PTH -Tried calling patient but could not reach her over the phone. Lab staff will try calling her as well.   Addendum 10/08/16: Vit. D and PTH levels normal. Bone malignancy less likely as patient has had intermittent mild elevation of ALP for several years. -Consider ordering bone scan at future visit to r/o Paget's disease

## 2016-09-18 NOTE — Assessment & Plan Note (Addendum)
BP 144/60. Reports taking toprol 25 qd and valsartan 160 qd. States just yesterday she took 2 pills of valsartan as instructed by her cardiologists office because her BP was 192/93. Denies having any CP, SOB, or blurry vision. However, does report having headaches, sinus pressure, and rhinorrhea for the past 1 week. No fevers or chills. States she has been taking an over the counter nasal decongestant to help with her symptoms.  Headaches likely 2/2 sinus congestion. Neuro exam non-focal. Sinuses non-TTP making acute sinusitis less likely.   -Continue current mgmt for HTN -Advised patient to stop taking OTC decongestants as they might be making her BP to up

## 2016-09-18 NOTE — Progress Notes (Signed)
CC: Patient is here to discuss HTN.   HPI:  [name] is a 82 y.o. F with a PMHx of conditions listed below presenting to the clinic to discuss HTN. Please see problem based charting for the status of the patient's current and chronic medical conditions.   Past Medical History:  Diagnosis Date  . Anemia   . Anginal pain ([place])   . Arthritis    "back, arms, hips" (09/23/2013)  . Benign hypertensive heart and kidney disease with diastolic CHF, NYHA class II and CKD stage III ([place])   . Blood transfusion 1972   "after daughter born, attempted to give me blood; couldn't give it cause my blood was cold" (09/23/2013)  . CAD (coronary artery disease)    a. LHC 08/23/11: dLM 40-50%, oRI 40%, oCFX 40%, oD1 70% (small and not amenable to PCI).  LM lesion did not appear to be flow limiting.  Medical rx was recommended;  b. Echo 08/23/11: mild LVH, EF 60-65%, grade 1 diast dysfxn, mild BAE, PASP 24;  c. 02/2012  Cath: LM 50d, LAD 50p, D1 70ost, RI 70p, RCA ok->Med Rx;  d. 01/2013 Cardiolite: EF 88, no ischemia/infarct.  . Carotid stenosis    a. dopplers 10/12:  0-39% bilat ICA;  b. 10/2012 U/S: 0-39% bilat, f/u 1 yr (10/2013).  . Chronic bronchitis   . Diverticulosis of colon (without mention of hemorrhage)   . GERD (gastroesophageal reflux disease)   . Heart murmur, systolic    2-D echo in December 2011 showed a normal EF with grade 1 diastolic dysfunction, trivial pulmonary regurgitation and mildly elevated PA pressure at 37 mmHg probably secondary to her COPD.dynamic obstruction-mid cavity obliteration;  b. 02/2012 Echo: EF 60-65%, mild LVH, PASP 38mmHg.  Marland Kitchen History of migraine headaches    a. Next atypical symptoms in the past. Patient was started on Neurontin for possible neuropathic origin of her pain.  Marland Kitchen History of stomach ulcers 1970's  . Hyperlipidemia   . Hypertension   . Hypothyroidism   . Migraines   . Polymyalgia rheumatica ([place])   . Shortness of breath on exertion    "just  related to angina >1 yr ago" (09/23/2013)  . Sinus arrhythmia   . Type II diabetes mellitus ([place])    a. On oral hypoglycemic agents.    Review of Systems:  Pertinent positives mentioned in HPI. Remainder of all ROS negative.   Physical Exam:  Vitals:   09/17/16 0854 09/17/16 0945 09/17/16 0949  BP: (!) 144/60 121/63 (!) 121/56  Pulse: 89 73 83  Temp: 97.8 F (36.6 C)    TempSrc: Oral    SpO2: 100%    Weight: 122 lb 1.6 oz (55.4 kg)    Height: 5\' 4"  (1.626 m)     Physical Exam  Constitutional: She is oriented to person, place, and time. No distress.  HENT:  Head: Normocephalic and atraumatic.  Mouth/Throat: Oropharynx is clear and moist.  Sinuses: non-TTP  Eyes: EOM are normal.  Neck: Neck supple. No tracheal deviation present.  Cardiovascular: Normal rate, regular rhythm and intact distal pulses.   Pulmonary/Chest: Effort normal and breath sounds normal. No respiratory distress. She has no wheezes. She has no rales.  Abdominal: Soft. Bowel sounds are normal. She exhibits no distension. There is no tenderness.  Musculoskeletal: Normal range of motion.  Neurological: She is alert and oriented to person, place, and time. No cranial nerve deficit.  Strength 5/5 and sensation grossly intact in b/l UEs and LEs.  Skin: Skin is warm and dry.    Assessment & Plan:   See Encounters Tab for problem based charting.  Patient discussed with Dr. Angelia Mould

## 2016-09-19 NOTE — Assessment & Plan Note (Signed)
Check vitamin D level 

## 2016-09-19 NOTE — Addendum Note (Signed)
Addended by: Shela Leff on: 09/19/2016 01:33 PM   Modules accepted: Orders

## 2016-09-23 NOTE — Progress Notes (Signed)
Internal Medicine Clinic Attending  Case discussed with Dr. Rathoreat the time of the visit. We reviewed the resident's history and exam and pertinent patient test results. I agree with the assessment, diagnosis, and plan of care documented in the resident's note.  

## 2016-09-24 ENCOUNTER — Ambulatory Visit: Payer: Medicare Other | Admitting: Gastroenterology

## 2016-09-29 LAB — SPECIMEN STATUS REPORT

## 2016-09-29 LAB — GAMMA GT: GGT: 33 IU/L (ref 0–60)

## 2016-10-02 ENCOUNTER — Ambulatory Visit (INDEPENDENT_AMBULATORY_CARE_PROVIDER_SITE_OTHER): Payer: Medicare Other | Admitting: Internal Medicine

## 2016-10-02 ENCOUNTER — Encounter: Payer: Self-pay | Admitting: Internal Medicine

## 2016-10-02 VITALS — BP 157/70 | HR 86 | Temp 97.9°F | Ht 64.8 in | Wt 123.3 lb

## 2016-10-02 DIAGNOSIS — I10 Essential (primary) hypertension: Secondary | ICD-10-CM

## 2016-10-02 DIAGNOSIS — Z79899 Other long term (current) drug therapy: Secondary | ICD-10-CM

## 2016-10-02 DIAGNOSIS — R748 Abnormal levels of other serum enzymes: Secondary | ICD-10-CM | POA: Diagnosis not present

## 2016-10-02 DIAGNOSIS — I2510 Atherosclerotic heart disease of native coronary artery without angina pectoris: Secondary | ICD-10-CM | POA: Diagnosis not present

## 2016-10-02 DIAGNOSIS — R911 Solitary pulmonary nodule: Secondary | ICD-10-CM | POA: Diagnosis not present

## 2016-10-02 DIAGNOSIS — G43909 Migraine, unspecified, not intractable, without status migrainosus: Secondary | ICD-10-CM | POA: Diagnosis not present

## 2016-10-02 DIAGNOSIS — I951 Orthostatic hypotension: Secondary | ICD-10-CM | POA: Diagnosis present

## 2016-10-02 DIAGNOSIS — R05 Cough: Secondary | ICD-10-CM

## 2016-10-02 DIAGNOSIS — E119 Type 2 diabetes mellitus without complications: Secondary | ICD-10-CM

## 2016-10-02 DIAGNOSIS — R058 Other specified cough: Secondary | ICD-10-CM

## 2016-10-02 DIAGNOSIS — G43809 Other migraine, not intractable, without status migrainosus: Secondary | ICD-10-CM

## 2016-10-02 DIAGNOSIS — I129 Hypertensive chronic kidney disease with stage 1 through stage 4 chronic kidney disease, or unspecified chronic kidney disease: Secondary | ICD-10-CM | POA: Diagnosis not present

## 2016-10-02 DIAGNOSIS — N189 Chronic kidney disease, unspecified: Secondary | ICD-10-CM | POA: Diagnosis not present

## 2016-10-02 DIAGNOSIS — N183 Chronic kidney disease, stage 3 unspecified: Secondary | ICD-10-CM

## 2016-10-02 DIAGNOSIS — J069 Acute upper respiratory infection, unspecified: Secondary | ICD-10-CM

## 2016-10-02 DIAGNOSIS — J209 Acute bronchitis, unspecified: Secondary | ICD-10-CM

## 2016-10-02 DIAGNOSIS — M8580 Other specified disorders of bone density and structure, unspecified site: Secondary | ICD-10-CM

## 2016-10-02 DIAGNOSIS — E785 Hyperlipidemia, unspecified: Secondary | ICD-10-CM | POA: Diagnosis not present

## 2016-10-02 DIAGNOSIS — E1122 Type 2 diabetes mellitus with diabetic chronic kidney disease: Secondary | ICD-10-CM

## 2016-10-02 MED ORDER — CARBAMIDE PEROXIDE 6.5 % OT SOLN: 5.0000 [drp] | Freq: Two times a day (BID) | OTIC | 2 refills | Status: DC

## 2016-10-02 MED ORDER — IMIPRAMINE HCL 25 MG PO TABS: 25.0000 mg | ORAL_TABLET | Freq: Two times a day (BID) | ORAL | 1 refills | Status: DC

## 2016-10-02 MED ORDER — METOPROLOL SUCCINATE ER 25 MG PO TB24: 12.5000 mg | ORAL_TABLET | Freq: Every day | ORAL | 3 refills | Status: DC

## 2016-10-02 MED ORDER — AZITHROMYCIN 250 MG PO TABS: ORAL_TABLET | ORAL | 0 refills | Status: AC

## 2016-10-02 MED ORDER — GUAIFENESIN-DM 100-10 MG/5ML PO SYRP: 5.0000 mL | ORAL_SOLUTION | ORAL | 0 refills | Status: DC | PRN

## 2016-10-02 MED ORDER — MECLIZINE HCL 12.5 MG PO TABS: 12.5000 mg | ORAL_TABLET | Freq: Three times a day (TID) | ORAL | 0 refills | Status: DC | PRN

## 2016-10-02 NOTE — Progress Notes (Signed)
   Subjective:    [name], female    DOB: 01/24/1934, 82 y.o.   MRN: [id number]  CC: Dizziness  HPI   Christine [name] is an 82yo woman with PMH of hypothyroidism, HLD, HTN, GERD, CAD, CKD, osteopenia, DM2, carpal tunnel syndrome.    She was recently seen in the clinic about 2 weeks ago by Dr. Marlowe Sax for dizziness and nausea for 1 weeks time.  She had borderline orthostatic vitals at that time.  The only lab abnormality was an elevated ALP.  GGT was normal and Dr. Marlowe Sax advised checking vitamin D and PTH.    Today, Christine [name] tells me that her dizziness and nausea coincided with URI/sinus symtoms for 5 weeks.  Pain in ear and left side of face last night.  Sinus drainage.  Cough, thick mucus, inability to clear throat.  No fever, chills.  She is somewhat SOB but feels very fatigued.  Dizzy/lightheaded, comes on after standing for a while.  In the clinic, we checked her vital signs and her BP dropped from the 140s to the [id number] systolic.  This was + for orthostatic hypotension.  She had no vertiginous symptoms that I could illicit, but mainly reported ability to stand up but then getting dizzy with a few minutes of standing.  She has no swelling, chest pain, palpitations.  She has a persistent cough and mucus sensation but feels that she cannot clear the secretions.    Other issues addressed at last visit was tinnitus, but this has improved today.  She has HLD for which she is treated with atorvastatin and her LDL is 55.       Review of Systems  Constitutional: Positive for fatigue. Negative for activity change, appetite change, chills and fever.  HENT: Positive for congestion, ear pain, hearing loss, postnasal drip, sinus pain and tinnitus. Negative for ear discharge.   Eyes: Negative for pain and redness.  Respiratory: Positive for cough and shortness of breath.   Cardiovascular: Negative for chest pain.  Gastrointestinal: Negative for anal bleeding and blood in stool.    Neurological: Positive for dizziness and light-headedness. Negative for syncope and weakness.       Objective:   Physical Exam  Constitutional: She is oriented to person, place, and time. She appears well-developed and well-nourished. No distress.  Appears fatigued, but not outside of her normal  HENT:  Head: Normocephalic and atraumatic.  Compacted ear wax in left ear canal, unable to see ear drum  Eyes: Conjunctivae are normal. Right eye exhibits no discharge. Left eye exhibits no discharge.  Cardiovascular: Normal rate and regular rhythm.   Murmur heard. Pulmonary/Chest: Effort normal and breath sounds normal. No respiratory distress. She has no wheezes.  Neurological: She is alert and oriented to person, place, and time. She exhibits normal muscle tone.  Psychiatric: She has a normal mood and affect. Her behavior is normal.    Vitamin D level, PTH, CBC today.       Assessment & Plan:  RTC in 1 month

## 2016-10-02 NOTE — Patient Instructions (Addendum)
Christine [name] - -   You appear to be having a prolonged upper respiratory infection.  For this, I will prescribe azithromycin (an antibiotic) for you to take for 5 days.  You will also have a cough medication called Robitussin DM.   For your dizziness, there are multiple reasons for you to have this, but most concerning is that your blood pressure drops when you are standing.  This is called orthostatic hypotension.  More information is below.  Treatment for this includes - DECREASE imipramine to 25mg  twice a day - DECREASE metoprolol to 12.5mg  once a day - WEAR over the counter compression stockings when upright - SLEEP with your head elevated on 1-2 pillows.  For your ear wax, please try debrox ear drops and take as recommended on the bottle.   We will also be checking blood work today to further look in to your fatigue.   Thank you, please call with questions.    Orthostatic Hypotension Orthostatic hypotension is a sudden drop in blood pressure that happens when you quickly change positions, such as when you get up from a seated or lying position. Blood pressure is a measurement of how strongly, or weakly, your blood is pressing against the walls of your arteries. Arteries are blood vessels that carry blood from your heart throughout your body. When blood pressure is too low, you may not get enough blood to your brain or to the rest of your organs. This can cause weakness, light-headedness, rapid heartbeat, and fainting. This can last for just a few seconds or for up to a few minutes. Orthostatic hypotension is usually not a serious problem. However, if it happens frequently or gets worse, it may be a sign of something more serious. What are the causes? This condition may be caused by:  Sudden changes in posture, such as standing up quickly after you have been sitting or lying down.  Blood loss.  Loss of body fluids (dehydration).  Heart problems.  Hormone (endocrine)  problems.  Pregnancy.  Severe infection.  Lack of certain nutrients.  Severe allergic reactions (anaphylaxis).  Certain medicines, such as blood pressure medicine or medicines that make the body lose excess fluids (diuretics). Sometimes, this condition can be caused by not taking medicine as directed, such as taking too much of a certain medicine. What increases the risk? Certain factors can make you more likely to develop orthostatic hypotension, including:  Age. Risk increases as you get older.  Conditions that affect the heart or the central nervous system.  Taking certain medicines, such as blood pressure medicine or diuretics.  Being pregnant. What are the signs or symptoms? Symptoms of this condition may include:  Weakness.  Light-headedness.  Dizziness.  Blurred vision.  Fatigue.  Rapid heartbeat.  Fainting, in severe cases. How is this diagnosed? This condition is diagnosed based on:  Your medical history.  Your symptoms.  Your blood pressure measurement. Your health care provider will check your blood pressure when you are:  Lying down.  Sitting.  Standing. A blood pressure reading is recorded as two numbers, such as "120 over 80" (or 120/80). The first ("top") number is called the systolic pressure. It is a measure of the pressure in your arteries as your heart beats. The second ("bottom") number is called the diastolic pressure. It is a measure of the pressure in your arteries when your heart relaxes between beats. Blood pressure is measured in a unit called mm Hg. Healthy blood pressure for adults is 120/80.  If your blood pressure is below 90/60, you may be diagnosed with hypotension. Other information or tests that may be used to diagnose orthostatic hypotension include:  Your other vital signs, such as your heart rate and temperature.  Blood tests.  Tilt table test. For this test, you will be safely secured to a table that moves you from a lying  position to an upright position. Your heart rhythm and blood pressure will be monitored during the test. How is this treated? Treatment for this condition may include:  Changing your diet. This may involve eating more salt (sodium) or drinking more water.  Taking medicines to raise your blood pressure.  Changing the dosage of certain medicines you are taking that might be lowering your blood pressure.  Wearing compression stockings. These stockings help to prevent blood clots and reduce swelling in your legs. In some cases, you may need to go to the hospital for:  Fluid replacement. This means you will receive fluids through an IV tube.  Blood replacement. This means you will receive donated blood through an IV tube (transfusion).  Treating an infection or heart problems, if this applies.  Monitoring. You may need to be monitored while medicines that you are taking wear off. Follow these instructions at home: Eating and drinking  Drink enough fluid to keep your urine clear or pale yellow.  Eat a healthy diet and follow instructions from your health care provider about eating or drinking restrictions. A healthy diet includes:  Fresh fruits and vegetables.  Whole grains.  Lean meats.  Low-fat dairy products.  Eat extra salt only as directed. Do not add extra salt to your diet unless your health care provider told you to do that.  Eat frequent, small meals.  Avoid standing up suddenly after eating. Medicines  Take over-the-counter and prescription medicines only as told by your health care provider.  Follow instructions from your health care provider about changing the dosage of your current medicines, if this applies.  Do not stop or adjust any of your medicines on your own. General instructions  Wear compression stockings as told by your health care provider.  Get up slowly from lying down or sitting positions. This gives your blood pressure a chance to  adjust.  Avoid hot showers and excessive heat as directed by your health care provider.  Return to your normal activities as told by your health care provider. Ask your health care provider what activities are safe for you.  Do not use any products that contain nicotine or tobacco, such as cigarettes and e-cigarettes. If you need help quitting, ask your health care provider.  Keep all follow-up visits as told by your health care provider. This is important. Contact a health care provider if:  You vomit.  You have diarrhea.  You have a fever for more than 2-3 days.  You feel more thirsty than usual.  You feel weak and tired. Get help right away if:  You have chest pain.  You have a fast or irregular heartbeat.  You develop numbness in any part of your body.  You cannot move your arms or your legs.  You have trouble speaking.  You become sweaty or feel lightheaded.  You faint.  You feel short of breath.  You have trouble staying awake.  You feel confused. This information is not intended to replace advice given to you by your health care provider. Make sure you discuss any questions you have with your health care provider. Document  Released: 10/25/2002 Document Revised: 07/23/2016 Document Reviewed: 04/26/2016 Elsevier Interactive Patient Education  2017 [employer].

## 2016-10-03 LAB — CBC WITH DIFFERENTIAL/PLATELET
BASOS: 0 %
Basophils Absolute: 0 10*3/uL (ref 0.0–0.2)
EOS (ABSOLUTE): 0.1 10*3/uL (ref 0.0–0.4)
EOS: 3 %
HEMATOCRIT: 37.2 % (ref 34.0–46.6)
HEMOGLOBIN: 12.3 g/dL (ref 11.1–15.9)
IMMATURE GRANULOCYTES: 0 %
Immature Grans (Abs): 0 10*3/uL (ref 0.0–0.1)
LYMPHS ABS: 2.1 10*3/uL (ref 0.7–3.1)
Lymphs: 39 %
MCH: 29.5 pg (ref 26.6–33.0)
MCHC: 33.1 g/dL (ref 31.5–35.7)
MCV: 89 fL (ref 79–97)
MONOCYTES: 8 %
Monocytes Absolute: 0.4 10*3/uL (ref 0.1–0.9)
NEUTROS PCT: 50 %
Neutrophils Absolute: 2.7 10*3/uL (ref 1.4–7.0)
Platelets: 284 10*3/uL (ref 150–379)
RBC: 4.17 x10E6/uL (ref 3.77–5.28)
RDW: 14.5 % (ref 12.3–15.4)
WBC: 5.4 10*3/uL (ref 3.4–10.8)

## 2016-10-03 LAB — PTH, INTACT AND CALCIUM
CALCIUM: 9.4 mg/dL (ref 8.7–10.3)
PTH: 42 pg/mL (ref 15–65)

## 2016-10-03 LAB — VITAMIN D 25 HYDROXY (VIT D DEFICIENCY, FRACTURES): VIT D 25 HYDROXY: 34.2 ng/mL (ref 30.0–100.0)

## 2016-10-04 NOTE — Assessment & Plan Note (Signed)
Check Vitamin D and PTH.  She is not taking supplementation except a multivitamin.    Will need a repeat DEXA once acute issues are improved.

## 2016-10-04 NOTE — Assessment & Plan Note (Signed)
She was diagnosed with this a few months ago.  She now complains of worsening URI/cough and thick mucus for the past 5 weeks.  Given her other issues and new dizziness, I would opt to treat.   Plan Mucinex Robitussin DM Azithromycin 500mg  X 1 and then 250mg  for 4 more days.

## 2016-10-04 NOTE — Assessment & Plan Note (Signed)
She brought in her CBG log which we reviewed, she has great control which I congratulated her on.  At last check A1C was 6.9.  We check this biannually at this point.  She has not had a diagnosis of DM neuropathy, but given new orthostasis, she may need further testing if BP not improved with changing medications.   Plan 1 month f/u.  A1C at that time.

## 2016-10-04 NOTE — Assessment & Plan Note (Signed)
As noted in HTN note.  She has severe migraines, but has not been trialed off of imipramine in many years.  Given her new Dx of orthostasis, will need to decrease this medication and see if her BP improves.    RTC in 1 month.

## 2016-10-04 NOTE — Assessment & Plan Note (Signed)
Given persistent cough, will opt to do repeat imaging a few months early.   Plan CT scan without contrast for nodule follow up.

## 2016-10-04 NOTE — Assessment & Plan Note (Signed)
PTH and vitamin D level today.

## 2016-10-04 NOTE — Assessment & Plan Note (Addendum)
She has resting hypertension, but now with orthostasis seen on 2 visits.  The first step in working this up is to review her medications.  She is 82yo and likely has some amount of autonomic dysfunction given her DM.  She is on a TCA which can certainly cause symptoms.  She is also on a beta blocker as well.  She brought in her blood sugar log which showed BS in the 80s-140s consistently.  I do not currently think she is having hypoglycemic episodes.    Other things to consider on the differential is bradycardia (she has CAD, on a beta blocker), HR today was 70; DM neuropathy; other neurological disorder or simply aging.   Plan Decrease imipramine to 25mg  BID.  She has a history of severe migraines many years ago and has been on this medication since that time.  She is very hesitant to stop the medication, so will plan to have her decrease only.  We discussed this at length, but I am concerned she may continue the medication anyway.  Decrease metoprolol to 12.5mg .  She has CAD and it is reasonable to have some beta blockade.   I also advised her to do a trial of compression stockings while upright and to sleep with her head at a 30 degree angle if possible.    If no improvement, consider decreasing her DM medications; further evaluation for neuropathy or underlying neurologic disorder based on symptoms.   Continue valstartan for now.   Follow up in 1 month for repeat vitals and evaluation.

## 2016-10-09 ENCOUNTER — Other Ambulatory Visit: Payer: Self-pay | Admitting: Internal Medicine

## 2016-10-09 ENCOUNTER — Ambulatory Visit (INDEPENDENT_AMBULATORY_CARE_PROVIDER_SITE_OTHER): Payer: Medicare Other | Admitting: Cardiology

## 2016-10-09 ENCOUNTER — Encounter (INDEPENDENT_AMBULATORY_CARE_PROVIDER_SITE_OTHER): Payer: Self-pay

## 2016-10-09 VITALS — BP 136/71 | HR 87 | Ht 64.8 in | Wt 123.0 lb

## 2016-10-09 DIAGNOSIS — I2583 Coronary atherosclerosis due to lipid rich plaque: Secondary | ICD-10-CM | POA: Diagnosis not present

## 2016-10-09 DIAGNOSIS — E7800 Pure hypercholesterolemia, unspecified: Secondary | ICD-10-CM | POA: Diagnosis not present

## 2016-10-09 DIAGNOSIS — I951 Orthostatic hypotension: Secondary | ICD-10-CM | POA: Diagnosis not present

## 2016-10-09 DIAGNOSIS — I2510 Atherosclerotic heart disease of native coronary artery without angina pectoris: Secondary | ICD-10-CM

## 2016-10-09 MED ORDER — PYRIDOSTIGMINE BROMIDE 60 MG PO TABS: ORAL_TABLET | ORAL | 1 refills | Status: DC

## 2016-10-09 NOTE — Patient Instructions (Signed)
Medication Instructions:  Start mestinon (pyridostimine) 30mg  two times a day. This will be 1/2 of a 60mg  tablet two times a day.   Labwork: None   Testing/Procedures: None   Follow-Up: Your physician recommends that you schedule a follow-up appointment in: about 3 weeks with Dr Aundra Dubin.   Any Other Special Instructions Will Be Listed Below (If Applicable).  Use compression stockings -put them on in the morning and take them off at night.   If you need a refill on your cardiac medications before your next appointment, please call your pharmacy.

## 2016-10-11 ENCOUNTER — Encounter: Payer: Self-pay | Admitting: Cardiology

## 2016-10-11 NOTE — Progress Notes (Signed)
[name], female   DOB: 01/24/1934, 82 y.o.   MRN: [id number] PCP: Dr. Daryll Drown  82 yo with history of CAD, HTN, and diabetes presents for cardiology followup.  I initially saw Christine [name] in the hospital in 10/12 with chest pain.  Left heart cath showed a 40-50% distal left main stenosis that was not thought to be flow-limiting.  She additionally had a 75% stenosis in a diagonal.  Chest pain resolved with medical therapy.  She was admitted again in 4/13 with chest pain and exertional dyspnea.  Cardiac enzymes were negative.  LHC looked similar to prior with 50% distal LM, 70% ostial D1, and 70% ostial ramus.  Echo showed preserved EF.  A dobutamine stress echo was done, this was submaximal but did not show evidence for ischemia.  No intervention, planned medical management.   Anti-anginal meds were titrated up and she was discharged home.  She developed chest pain (atypical) again in 2014.  Lexiscan Cardiolite was normal in 3/14.    Christine [name] went to the ER in 10/16 with dyspnea.  CXR showed clear lungs and BNP was 104.  Lexiscan Cardiolite was done, no ischemia or infarction.  She continued to have exertional dyspnea. LHC/RHC was done in 11/16 with normal filling pressures and similar anatomy to 4/13 cath, no intervention.  CPX in 12/16 was suggestive of severe chronotropic incompetence.  Since then, Toprol XL has been cut back a lot, she is now taking only 12.5 mg daily.   Currently, main issue seems to be orthostatic hypotension.  She has orthostatic BP fall with dizziness with standing, but she also has supine hypertension.  She dropped 14 mmHg between sitting and standing today with dizziness.  No chest pain now.  Really no dyspnea walking around her house or outside on flat ground.  However, she is not very active.  No falls though she "staggers."    Labs (2/13): K 3.3, creatinine 0.9 Labs (3/13): HDL 42, LDL 49 Labs (4/13): K 4.1, creatinine 1.01 Labs (10/13): LDL 56, HDL 38 Labs  (2/14): K 3.8, creatinine 0.98 Labs (7/14): K 4.4, creatinine 1.17 Labs (12/14): LDL 75, HDL 51 Labs (9/15): K 3.7, creatinine 1.09, LDL 67, HDL 35 Labs (2/16): K 3.9, creatinine 1.07 Labs (10/16): K 3.9, creatinine 1.08, BNP 104, HCT 38.4 Labs (6/17): LDL 55, HDL 40  ECG: NSR, old ASMI  PMH: 1. CAD: Chest pain 10/12.  LHC with 40-50% distal LM stenosis, 70-75% ostial D1.  No flow-limiting lesions, medically managed.  Echo (10/12): EF 60-65%, mild LV hypertrophy, PA systolic pressure 24 mmHg.  Chest pain 4/13.  LHC with 50% distal LM, 70% ostial D1, 50% proximal LAD, 70% proximal ramus.  No flow-limiting lesions, medically managed.  Echo (4/13) with EF 60-65%, mild LVH.  DSE (4/13) with no ischemia but it was a submaximal study.  Lexiscan Cardiolite (3/14) with EF 88%, no ischemia or infarction.  Lexiscan Cardiolite (10/16) with EF 82%, no ischemia/infarction.  Echo (11/16) with EF 60-65%, moderate LVH.  - LHC/RHC 11/16: 40-50% dLM, 70-80% D1 (no change to coronaries from 2013); mean RA 2, PA 28/5, mean PCWP 5, CI 2.28.  - CPX (12/16): peak VO2 10.6, VE/VCO2 slope 42.3, RER 1.1.  Marked high VE/VCO2 slope suggested elevated LV filling pressure but RHC ok.  Severe chronotropic incompetence, mild obstruction on PFTs.  2. DM2 3. HTN 4. Hyperlipidemia 5. CKD 6. Carotid dopplers (10/12): 0-39% bilateral carotid stenosis.  7. Allergy to aspirin => rash.  8. GERD 9. Migraines 10. Hypothyroidism 11. COPD 76. PMR: Christine [name].  13. ABIs (4/15): Normal 14. Sciatica 15. Holter (5/17): occasional PACs/PVCs, no atrial fibrillation, lower HR 47.    SH: Nonsmoker.  Lives in [place].   FH: CAD  Current Outpatient Prescriptions  Medication Sig Dispense Refill  . acetaminophen (TYLENOL) 500 MG tablet Take 1,000 mg by mouth every 6 (six) hours as needed for headache. For pain    . ADVAIR DISKUS 250-50 MCG/DOSE AEPB inhale 1 dose by mouth every 12 hours 60 each 11  . atorvastatin (LIPITOR) 40 MG  tablet Take 1 tablet (40 mg total) by mouth every evening. 90 tablet 3  . carbamide peroxide (DEBROX) 6.5 % otic solution Place 5 drops into the left ear 2 (two) times daily. 15 mL 2  . clopidogrel (PLAVIX) 75 MG tablet Take 1 tablet (75 mg total) by mouth every evening. 30 tablet 5  . esomeprazole (NEXIUM) 40 MG capsule take 1 capsule by mouth once daily BEFORE BREAKFAST 30 capsule 1  . fluticasone (FLONASE) 50 MCG/ACT nasal spray Place 1 spray into both nostrils daily. 16 g 2  . glimepiride (AMARYL) 1 MG tablet take 1 tablet by mouth once daily BEFORE BREAKFAST 90 tablet 3  . glucose blood (ONE TOUCH ULTRA TEST) test strip 1 each by Other route as needed for other. Use 1 to 2 times daily to check blood sugar. diag code 250.02. Non-insulin dependent 100 each 4  . guaiFENesin-dextromethorphan (ROBITUSSIN DM) 100-10 MG/5ML syrup Take 5 mLs by mouth every 4 (four) hours as needed for cough. 118 mL 0  . hydroxypropyl methylcellulose (ISOPTO TEARS) 2.5 % ophthalmic solution Place 1 drop into both eyes 2 (two) times daily.     Marland Kitchen imipramine (TOFRANIL) 25 MG tablet Take 1 tablet (25 mg total) by mouth 2 (two) times daily. 60 tablet 1  . isosorbide mononitrate (IMDUR) 30 MG 24 hr tablet Take 30 mg by mouth daily.    Marland Kitchen levothyroxine (SYNTHROID, LEVOTHROID) 25 MCG tablet take 1 tablet by mouth once daily 90 tablet 1  . loratadine (CLARITIN) 10 MG tablet Take 10 mg by mouth daily as needed for allergies, rhinitis or itching.    . metoprolol succinate (TOPROL XL) 25 MG 24 hr tablet Take 0.5 tablets (12.5 mg total) by mouth daily. 90 tablet 3  . NITROSTAT 0.4 MG SL tablet place 1 tablet under the tongue if needed every 5 minutes for chest pain for 3 doses IF NO RELIEF AFTER 3RD DOSE CALL PRESCRIBER OR 911. 25 tablet 3  . ONETOUCH DELICA LANCETS 99991111 MISC Inject 1 each into the skin as needed. Use 1 to 2 times daily to check blood sugar. diag code 250.02. Non-insulin dependent 200 each 4  . Pediatric Multiple  Vitamins (CHEWABLE MULTIPLE VITAMINS PO) Take 2 tablets by mouth daily.    . valsartan (DIOVAN) 160 MG tablet Take 1 tablet (160 mg total) by mouth daily. 90 tablet 3  . pyridostigmine (MESTINON) 60 MG tablet 1/2 tablet (30mg ) two times 30 tablet 1   No current facility-administered medications for this visit.    BP 136/71 (BP Location: Right Arm, Cuff Size: Normal)   Pulse 87   Ht 5' 4.8" (1.646 m)   Wt 123 lb (55.8 kg)   LMP 02/11/1975   BMI 20.59 kg/m  General: NAD Neck: No JVD, no thyromegaly or thyroid nodule.  Lungs: Clear to auscultation bilaterally with normal respiratory effort. CV: Nondisplaced PMI.  Heart regular S1/S2, no  S3/S4, 2/6 early SEM with clear S2.  No peripheral edema.  No carotid bruit.  1+ PT bilaterally.   Abdomen: Soft, nontender, no hepatosplenomegaly, no distention.  Neurologic: Alert and oriented x 3.  Psych: Normal affect. Extremities: No clubbing or cyanosis.   Assessment/Plan: 1. CAD: Non-flow limiting, moderate disease on cath in 4/13 including 50% distal LM. No ischemia or infarction on Lexiscan Cardiolite in 10/16.  Repeat cath in 11/16 with similar anatomy, 40-50% distal LM.  No intervention.  She has had no chest pain recently.  Dyspnea is actually improved also, possibly with cutting back on Toprol XL and improvement in chronotropic competence.  - Continue Plavix (allergic to aspirin), Toprol XL, valsartan, Imdur. - She is on atorvastatin.   2. Hyperlipidemia: Good lipids 6/17.   3. Orthostatic hypotension/supine hypertension: This is her main issue currently.  Gets dizzy with standing, but would not use midodrine or Florinef given supine hypertension.  - Wear compression stockings. - I will try her on pyridostigmine 30 mg bid to see if this helps.   Followup in 3 weeks.   Loralie Champagne 10/11/2016

## 2016-10-15 ENCOUNTER — Ambulatory Visit: Payer: Medicare Other

## 2016-10-16 ENCOUNTER — Encounter: Payer: Self-pay | Admitting: Internal Medicine

## 2016-10-21 ENCOUNTER — Ambulatory Visit (HOSPITAL_COMMUNITY)
Admission: RE | Admit: 2016-10-21 | Discharge: 2016-10-21 | Disposition: A | Discharge status: Home / Self Care | Payer: Medicare Other | Source: Ambulatory Visit | Attending: Internal Medicine | Admitting: Internal Medicine

## 2016-10-21 DIAGNOSIS — R911 Solitary pulmonary nodule: Secondary | ICD-10-CM | POA: Diagnosis present

## 2016-10-21 DIAGNOSIS — I700 Atherosclerosis of aorta: Secondary | ICD-10-CM | POA: Insufficient documentation

## 2016-10-21 DIAGNOSIS — R918 Other nonspecific abnormal finding of lung field: Secondary | ICD-10-CM | POA: Insufficient documentation

## 2016-10-21 DIAGNOSIS — M5134 Other intervertebral disc degeneration, thoracic region: Secondary | ICD-10-CM | POA: Insufficient documentation

## 2016-10-21 DIAGNOSIS — I2510 Atherosclerotic heart disease of native coronary artery without angina pectoris: Secondary | ICD-10-CM | POA: Diagnosis not present

## 2016-10-29 ENCOUNTER — Telehealth: Payer: Self-pay | Admitting: Internal Medicine

## 2016-10-29 NOTE — Telephone Encounter (Signed)
Called and spoke with Christine [name] regarding her CT results.  We discussed and she did not have any questions.  She noted continued dizziness and new headaches.  She will be in to the clinic tomorrow.

## 2016-10-30 ENCOUNTER — Encounter (INDEPENDENT_AMBULATORY_CARE_PROVIDER_SITE_OTHER): Payer: Self-pay

## 2016-10-30 ENCOUNTER — Encounter: Payer: Self-pay | Admitting: Internal Medicine

## 2016-10-30 ENCOUNTER — Ambulatory Visit (INDEPENDENT_AMBULATORY_CARE_PROVIDER_SITE_OTHER): Payer: Medicare Other | Admitting: Internal Medicine

## 2016-10-30 VITALS — BP 155/53 | HR 68 | Temp 97.5°F | Ht 64.5 in | Wt 123.0 lb

## 2016-10-30 DIAGNOSIS — R42 Dizziness and giddiness: Secondary | ICD-10-CM

## 2016-10-30 DIAGNOSIS — I2510 Atherosclerotic heart disease of native coronary artery without angina pectoris: Secondary | ICD-10-CM

## 2016-10-30 DIAGNOSIS — R51 Headache: Secondary | ICD-10-CM

## 2016-10-30 DIAGNOSIS — I10 Essential (primary) hypertension: Secondary | ICD-10-CM | POA: Diagnosis not present

## 2016-10-30 DIAGNOSIS — R748 Abnormal levels of other serum enzymes: Secondary | ICD-10-CM

## 2016-10-30 DIAGNOSIS — R911 Solitary pulmonary nodule: Secondary | ICD-10-CM

## 2016-10-30 DIAGNOSIS — M8588 Other specified disorders of bone density and structure, other site: Secondary | ICD-10-CM

## 2016-10-30 DIAGNOSIS — Z79899 Other long term (current) drug therapy: Secondary | ICD-10-CM

## 2016-10-30 DIAGNOSIS — J309 Allergic rhinitis, unspecified: Secondary | ICD-10-CM | POA: Diagnosis not present

## 2016-10-30 DIAGNOSIS — J302 Other seasonal allergic rhinitis: Secondary | ICD-10-CM

## 2016-10-30 DIAGNOSIS — E119 Type 2 diabetes mellitus without complications: Secondary | ICD-10-CM

## 2016-10-30 LAB — POCT GLYCOSYLATED HEMOGLOBIN (HGB A1C): HEMOGLOBIN A1C: 6.6

## 2016-10-30 LAB — GLUCOSE, CAPILLARY: GLUCOSE-CAPILLARY: 147 mg/dL — AB (ref 65–99)

## 2016-10-30 NOTE — Assessment & Plan Note (Signed)
She had documented orthostasis.  Metoprolol and imipramine were decreased.  She is no not orthostatic.  She will need better blood pressure control, would avoid further beta blockade if possible.    She is very concerned that decreasing her imipramine has caused her to start having headaches again. She tells an interesting story from 30 years ago about how she was started on the medication for vertigo and when she tried to come off of it she developed severe migraines.  Once she was back on it, she never again had a headache.  She is now having headaches again and is concerned it is due to withdrawal from the medication.   Given that her BP is improved.  I advised her to do a trial of imipramine 50mg  again.  If her dizziness worsens, she should go back down to 25mg .  I did not change her BP medications as she will be seeing her cardiologist tomorrow.  She made need another agent for better control without worsening orthostasis.   Plan Continue compression stockings Trial of previous dose of imipramine Follow up with Cardiology.

## 2016-10-30 NOTE — Assessment & Plan Note (Signed)
No current symptoms.  Well controlled with flonase.   Plan Continue Flonase

## 2016-10-30 NOTE — Patient Instructions (Signed)
Christine [name] - -   Your dizziness is better with improvement in your sinus infection.  Please continue wearing the compression stockings.  Please discuss further use of pyridostigmine with Dr. Aundra Dubin.  Continue holding the medication for now.   Given the return of your headaches, it is okay to go back up to Imiprimine 50mg  and see how you do.  If your dizziness worsens please decrease the dose back to 25mg .   Please come back and see me in 3 months.

## 2016-10-30 NOTE — Progress Notes (Signed)
   Subjective:    [name], female    DOB: 01/24/1934, 82 y.o.   MRN: [id number]  CC: 1 month follow up for Dizziness.   HPI  Christine [name] is an 82yo woman with PMH of DM2, HTN, orthostatic hypotenstion.   Reviewed Dr. Claris Gladden note, trial of pyridostigmine started at last visit with him on 11/22.  Orthostatic vitals today are  She reports that dizziness is better.  She has cut back on her imipramine.  She tells me that she was initially started on the imiprimine for vertigo and then had headaches when she tried to stop it.  She has thus been on the medication for 30 years.  She thinks cutting back has caused her headaches to return.  She had a headache 2 days ago which started suddenly while doing dishes.  She checked her BP at the time and it was noted to be [id number] systolic.  She would like to go back up on the medication.   She reports that she tried pyridostigmine but had worsened dizziness and muscle cramps with the medication ( can cause vertigo and myalgia) so she stopped the medication.  She has been feeling better since that time.    Her A1C today was 6.6.  She reports no symptoms of hypoglycemia.   At the end of the visit, her daughter entered the room and also wanted to speak to me.  She reports that about a week ago, her mother had a near syncopal event while trying to have a bowel movement.  She was bearing down and became hot, sweaty and almost passed out.  She was helped to bed and the sensation passed.  It sounded like vasovagal symptoms to me.  We discussed syncope and the autonomic nervous system and how this may be playing a part in Christine [name] symptoms.  Her orthostatic vitals today were normal (163/64 lying; 171/67 standing)  Review of Systems  Constitutional: Negative for activity change and appetite change.  Respiratory: Negative for cough and shortness of breath.   Cardiovascular: Negative for chest pain.  Musculoskeletal: Positive for myalgias. Negative for  arthralgias and back pain.  Neurological: Positive for dizziness and light-headedness. Negative for syncope and numbness.       Objective:   Physical Exam  Constitutional: She is oriented to person, place, and time.  Thin elderly woman, NAD  HENT:  Head: Normocephalic and atraumatic.  Eyes: Conjunctivae are normal. No scleral icterus.  Cardiovascular: Normal rate and regular rhythm.   No murmur heard. Wearing compression stockings  Pulmonary/Chest: Effort normal and breath sounds normal. No respiratory distress.  Abdominal: Soft. Bowel sounds are normal. There is no tenderness.  Musculoskeletal: She exhibits no edema or tenderness.  Neurological: She is alert and oriented to person, place, and time. She exhibits normal muscle tone.  Skin: Skin is warm and dry. She is not diaphoretic.  Psychiatric: She has a normal mood and affect. Her behavior is normal.  Vitals reviewed.     DEXA repeat ordered today.     Assessment & Plan:  RTC in 3 months, sooner if needed

## 2016-10-30 NOTE — Assessment & Plan Note (Signed)
She reports no chest pain, though her symptoms of dizziness and sweating could be an anginal equivalent?  She is seeing Dr. Aundra Dubin tomorrow and I advised her to discuss with him.  Symptoms resolved on their own a week ago and she has had no recurrence.    Continue to follow with Cardiology Continue statin, IMDUR, metoprolol, ARB  Now that she is no longer having orthostatic hypotension, consider better BP control and increase of metoprolol

## 2016-10-30 NOTE — Assessment & Plan Note (Signed)
Repeat imaging with stable nodules.  No further follow up imaging needed.

## 2016-10-30 NOTE — Assessment & Plan Note (Signed)
BP now elevated.    Plan See above in discussion about dizziness.  She likely needs another agent, but should avoid any medications that can worsen orthostasis.   She is following up with Cardiology tomorrow.  Consider addition of hydralazine.  Will review notes from that visit.

## 2016-10-30 NOTE — Assessment & Plan Note (Signed)
Vitamin D and PTH normal  Plan Repeat DEXA.   She was not able to tolerate calcium.  Bone density may be worsening.

## 2016-10-31 ENCOUNTER — Encounter: Payer: Self-pay | Admitting: Cardiology

## 2016-10-31 ENCOUNTER — Ambulatory Visit (INDEPENDENT_AMBULATORY_CARE_PROVIDER_SITE_OTHER): Payer: Medicare Other | Admitting: Cardiology

## 2016-10-31 VITALS — BP 134/56 | HR 96 | Ht 64.5 in | Wt 121.0 lb

## 2016-10-31 DIAGNOSIS — I2510 Atherosclerotic heart disease of native coronary artery without angina pectoris: Secondary | ICD-10-CM | POA: Diagnosis not present

## 2016-10-31 DIAGNOSIS — I951 Orthostatic hypotension: Secondary | ICD-10-CM

## 2016-10-31 DIAGNOSIS — I10 Essential (primary) hypertension: Secondary | ICD-10-CM

## 2016-10-31 DIAGNOSIS — I2583 Coronary atherosclerosis due to lipid rich plaque: Secondary | ICD-10-CM

## 2016-10-31 MED ORDER — METOPROLOL SUCCINATE ER 25 MG PO TB24: 25.0000 mg | ORAL_TABLET | Freq: Every day | ORAL | 1 refills | Status: DC

## 2016-10-31 NOTE — Patient Instructions (Signed)
Medication Instructions:  Increase Toprol XL (metoprolol succinate) to 25mg  daily  Labwork: None  Testing/Procedures: None   Follow-Up: Your physician recommends that you schedule a follow-up appointment in: 3 months with Richardson Dopp, PA  Your physician wants you to follow-up in: 6 months with Dr Oval Linsey. (June 2018).  You will receive a reminder letter in the mail two months in advance. If you don't receive a letter, please call our office to schedule the follow-up appointment.         If you need a refill on your cardiac medications before your next appointment, please call your pharmacy.

## 2016-10-31 NOTE — Progress Notes (Signed)
Patient ID: [name], female   DOB: 01/24/1934, 82 y.o.   MRN: [id number] PCP: Dr. Daryll [name]  82 yo with history of CAD, HTN, and diabetes presents for cardiology followup.  I initially saw Christine [name] in the hospital in 10/12 with chest pain.  Left heart cath showed a 40-50% distal left main stenosis that was not thought to be flow-limiting.  She additionally had a 75% stenosis in a diagonal.  Chest pain resolved with medical therapy.  She was admitted again in 4/13 with chest pain and exertional dyspnea.  Cardiac enzymes were negative.  LHC looked similar to prior with 50% distal LM, 70% ostial D1, and 70% ostial ramus.  Echo showed preserved EF.  A dobutamine stress echo was done, this was submaximal but did not show evidence for ischemia.  No intervention, planned medical management.   Anti-anginal meds were titrated up and she was discharged home.  She developed chest pain (atypical) again in 2014.  Lexiscan Cardiolite was normal in 3/14.    Christine [name] went to the ER in 10/16 with dyspnea.  CXR showed clear lungs and BNP was 104.  Lexiscan Cardiolite was done, no ischemia or infarction.  She continued to have exertional dyspnea. LHC/RHC was done in 11/16 with normal filling pressures and similar anatomy to 4/13 cath, no intervention.  CPX in 12/16 was suggestive of severe chronotropic incompetence.  Since then, Toprol XL has been cut back a lot, she is now taking only 12.5 mg daily.   At prior visit, her main complaint was orthostatic hypotension.  I had her wear compression stockings and tried her on pyridostigmine given orthostatic hypotension with supine hypertension.  She stopped pyridostigmine as she felt more dizzy when she took it than when she did not.  Regardless, she is no longer as symptomatic => still gets occasional "dizziness" but overall better.  No falls.  She was not orthostatic when checked by PCP yesterday.  SBP now running in 140s-150s generally when she checks at home.   Labs (2/13):  K 3.3, creatinine 0.9 Labs (3/13): HDL 42, LDL 49 Labs (4/13): K 4.1, creatinine 1.01 Labs (10/13): LDL 56, HDL 38 Labs (2/14): K 3.8, creatinine 0.98 Labs (7/14): K 4.4, creatinine 1.17 Labs (12/14): LDL 75, HDL 51 Labs (9/15): K 3.7, creatinine 1.09, LDL 67, HDL 35 Labs (2/16): K 3.9, creatinine 1.07 Labs (10/16): K 3.9, creatinine 1.08, BNP 104, HCT 38.4 Labs (6/17): LDL 55, HDL 40  PMH: 1. CAD: Chest pain 10/12.  LHC with 40-50% distal LM stenosis, 70-75% ostial D1.  No flow-limiting lesions, medically managed.  Echo (10/12): EF 60-65%, mild LV hypertrophy, PA systolic pressure 24 mmHg.  Chest pain 4/13.  LHC with 50% distal LM, 70% ostial D1, 50% proximal LAD, 70% proximal ramus.  No flow-limiting lesions, medically managed.  Echo (4/13) with EF 60-65%, mild LVH.  DSE (4/13) with no ischemia but it was a submaximal study.  Lexiscan Cardiolite (3/14) with EF 88%, no ischemia or infarction.  Lexiscan Cardiolite (10/16) with EF 82%, no ischemia/infarction.  Echo (11/16) with EF 60-65%, moderate LVH.  - LHC/RHC 11/16: 40-50% dLM, 70-80% D1 (no change to coronaries from 2013); mean RA 2, PA 28/5, mean PCWP 5, CI 2.28.  - CPX (12/16): peak VO2 10.6, VE/VCO2 slope 42.3, RER 1.1.  Marked high VE/VCO2 slope suggested elevated LV filling pressure but RHC ok.  Severe chronotropic incompetence, mild obstruction on PFTs.  2. DM2 3. HTN 4. Hyperlipidemia 5. CKD 6. Carotid dopplers (  10/12): 0-39% bilateral carotid stenosis.  7. Allergy to aspirin => rash.  8. GERD 9. Migraines 10. Hypothyroidism 11. COPD 24. PMR: Christine [name].  13. ABIs (4/15): Normal 14. Sciatica 15. Holter (5/17): occasional PACs/PVCs, no atrial fibrillation, lower HR 47.   16. Orthostatic hypotension  SH: Nonsmoker.  Lives in [place].   FH: CAD  Current Outpatient Prescriptions  Medication Sig Dispense Refill  . acetaminophen (TYLENOL) 500 MG tablet Take 1,000 mg by mouth every 6 (six) hours as needed for headache.  For pain    . ADVAIR DISKUS 250-50 MCG/DOSE AEPB inhale 1 dose by mouth every 12 hours 60 each 11  . Ascorbic Acid (VITAMIN C PO) Take 2 tablets by mouth daily.    Marland Kitchen atorvastatin (LIPITOR) 40 MG tablet Take 1 tablet (40 mg total) by mouth every evening. 90 tablet 3  . carbamide peroxide (DEBROX) 6.5 % otic solution Place 5 drops into the left ear 2 (two) times daily. 15 mL 2  . clopidogrel (PLAVIX) 75 MG tablet take 1 tablet by mouth every evening 30 tablet 5  . esomeprazole (NEXIUM) 40 MG capsule take 1 capsule by mouth once daily BEFORE BREAKFAST 30 capsule 1  . fluticasone (FLONASE) 50 MCG/ACT nasal spray Place 1 spray into both nostrils daily. 16 g 2  . glimepiride (AMARYL) 1 MG tablet take 1 tablet by mouth once daily BEFORE BREAKFAST 90 tablet 3  . glucose blood (ONE TOUCH ULTRA TEST) test strip 1 each by Other route as needed for other. Use 1 to 2 times daily to check blood sugar. diag code 250.02. Non-insulin dependent 100 each 4  . guaiFENesin-dextromethorphan (ROBITUSSIN DM) 100-10 MG/5ML syrup Take 5 mLs by mouth every 4 (four) hours as needed for cough. 118 mL 0  . hydroxypropyl methylcellulose (ISOPTO TEARS) 2.5 % ophthalmic solution Place 1 drop into both eyes 2 (two) times daily.     Marland Kitchen imipramine (TOFRANIL) 25 MG tablet Take 1 tablet (25 mg total) by mouth 2 (two) times daily. 60 tablet 1  . isosorbide mononitrate (IMDUR) 30 MG 24 hr tablet Take 30 mg by mouth daily.    Marland Kitchen levothyroxine (SYNTHROID, LEVOTHROID) 25 MCG tablet take 1 tablet by mouth once daily 90 tablet 1  . loratadine (CLARITIN) 10 MG tablet Take 10 mg by mouth daily as needed for allergies, rhinitis or itching.    Marland Kitchen NITROSTAT 0.4 MG SL tablet place 1 tablet under the tongue if needed every 5 minutes for chest pain for 3 doses IF NO RELIEF AFTER 3RD DOSE CALL PRESCRIBER OR 911. 25 tablet 3  . ONETOUCH DELICA LANCETS 99991111 MISC Inject 1 each into the skin as needed. Use 1 to 2 times daily to check blood sugar. diag code  250.02. Non-insulin dependent 200 each 4  . Pediatric Multiple Vitamins (CHEWABLE MULTIPLE VITAMINS PO) Take 2 tablets by mouth daily.    Marland Kitchen pyridostigmine (MESTINON) 60 MG tablet 1/2 tablet (30mg ) two times 30 tablet 1  . valsartan (DIOVAN) 160 MG tablet Take 1 tablet (160 mg total) by mouth daily. 90 tablet 3  . metoprolol succinate (TOPROL XL) 25 MG 24 hr tablet Take 1 tablet (25 mg total) by mouth daily. 90 tablet 1   No current facility-administered medications for this visit.    BP (!) 134/56   Pulse 96   Ht 5' 4.5" (1.638 m)   Wt 121 lb (54.9 kg)   LMP 02/11/1975   BMI 20.45 kg/m  General: NAD Neck: No JVD, no  thyromegaly or thyroid nodule.  Lungs: Clear to auscultation bilaterally with normal respiratory effort. CV: Nondisplaced PMI.  Heart regular S1/S2, no S3/S4, 2/6 early SEM with clear S2.  No peripheral edema.  No carotid bruit.  1+ PT bilaterally.   Abdomen: Soft, nontender, no hepatosplenomegaly, no distention.  Neurologic: Alert and oriented x 3.  Psych: Normal affect. Extremities: No clubbing or cyanosis.   Assessment/Plan: 1. CAD: Non-flow limiting, moderate disease on cath in 4/13 including 50% distal LM. No ischemia or infarction on Lexiscan Cardiolite in 10/16.  Repeat cath in 11/16 with similar anatomy, 40-50% distal LM.  No intervention.  She has had no chest pain recently.  Dyspnea is actually improved also, possibly with cutting back on Toprol XL and improvement in chronotropic competence.  - Continue Plavix (allergic to aspirin), Toprol XL, valsartan, Imdur. - She is on atorvastatin.   2. Hyperlipidemia: Good lipids 6/17.   3. Orthostatic hypotension/supine hypertension: This seems better.  She did not tolerate pyridostigmine and stopped it.  Continue compression stockings. Long term, would not use midodrine or Florinef given supine hypertension.  She is concerned about her BP running higher.  We discussed an incremental increase in her Toprol XL back to 25 mg  daily.   Followup in 3 mos with Christine [name], then 6 months with Christine [name] given my transition to CHF clinic.   Loralie Champagne 10/31/2016

## 2016-11-04 ENCOUNTER — Other Ambulatory Visit: Payer: Self-pay | Admitting: Internal Medicine

## 2016-11-04 DIAGNOSIS — Z1231 Encounter for screening mammogram for malignant neoplasm of breast: Secondary | ICD-10-CM

## 2016-11-06 ENCOUNTER — Ambulatory Visit
Admission: RE | Admit: 2016-11-06 | Discharge: 2016-11-06 | Disposition: A | Discharge status: Home / Self Care | Payer: Medicare Other | Source: Ambulatory Visit | Attending: Internal Medicine | Admitting: Internal Medicine

## 2016-11-06 DIAGNOSIS — M8589 Other specified disorders of bone density and structure, multiple sites: Secondary | ICD-10-CM | POA: Diagnosis not present

## 2016-11-06 DIAGNOSIS — M8588 Other specified disorders of bone density and structure, other site: Secondary | ICD-10-CM

## 2016-11-06 DIAGNOSIS — Z780 Asymptomatic menopausal state: Secondary | ICD-10-CM | POA: Diagnosis not present

## 2016-11-25 ENCOUNTER — Encounter (HOSPITAL_COMMUNITY): Payer: Self-pay | Admitting: Emergency Medicine

## 2016-11-25 ENCOUNTER — Emergency Department (HOSPITAL_COMMUNITY)
Admission: EM | Admit: 2016-11-25 | Discharge: 2016-11-26 | Disposition: A | Discharge status: Home / Self Care | Payer: Medicare Other | Source: Home / Self Care

## 2016-11-25 DIAGNOSIS — I2510 Atherosclerotic heart disease of native coronary artery without angina pectoris: Secondary | ICD-10-CM | POA: Insufficient documentation

## 2016-11-25 DIAGNOSIS — Z5321 Procedure and treatment not carried out due to patient leaving prior to being seen by health care provider: Secondary | ICD-10-CM | POA: Insufficient documentation

## 2016-11-25 DIAGNOSIS — E119 Type 2 diabetes mellitus without complications: Secondary | ICD-10-CM | POA: Diagnosis not present

## 2016-11-25 DIAGNOSIS — H35033 Hypertensive retinopathy, bilateral: Secondary | ICD-10-CM | POA: Diagnosis not present

## 2016-11-25 DIAGNOSIS — E039 Hypothyroidism, unspecified: Secondary | ICD-10-CM | POA: Insufficient documentation

## 2016-11-25 DIAGNOSIS — I5030 Unspecified diastolic (congestive) heart failure: Secondary | ICD-10-CM | POA: Insufficient documentation

## 2016-11-25 DIAGNOSIS — Z79899 Other long term (current) drug therapy: Secondary | ICD-10-CM | POA: Insufficient documentation

## 2016-11-25 DIAGNOSIS — I160 Hypertensive urgency: Secondary | ICD-10-CM | POA: Diagnosis not present

## 2016-11-25 DIAGNOSIS — I491 Atrial premature depolarization: Secondary | ICD-10-CM | POA: Diagnosis not present

## 2016-11-25 DIAGNOSIS — N183 Chronic kidney disease, stage 3 (moderate): Secondary | ICD-10-CM | POA: Insufficient documentation

## 2016-11-25 DIAGNOSIS — E1122 Type 2 diabetes mellitus with diabetic chronic kidney disease: Secondary | ICD-10-CM | POA: Insufficient documentation

## 2016-11-25 DIAGNOSIS — K5900 Constipation, unspecified: Secondary | ICD-10-CM

## 2016-11-25 DIAGNOSIS — R911 Solitary pulmonary nodule: Secondary | ICD-10-CM | POA: Diagnosis not present

## 2016-11-25 DIAGNOSIS — M353 Polymyalgia rheumatica: Secondary | ICD-10-CM | POA: Diagnosis not present

## 2016-11-25 DIAGNOSIS — I700 Atherosclerosis of aorta: Secondary | ICD-10-CM | POA: Diagnosis not present

## 2016-11-25 DIAGNOSIS — R4182 Altered mental status, unspecified: Secondary | ICD-10-CM | POA: Diagnosis not present

## 2016-11-25 DIAGNOSIS — E785 Hyperlipidemia, unspecified: Secondary | ICD-10-CM | POA: Diagnosis not present

## 2016-11-25 DIAGNOSIS — R251 Tremor, unspecified: Secondary | ICD-10-CM | POA: Diagnosis not present

## 2016-11-25 DIAGNOSIS — I130 Hypertensive heart and chronic kidney disease with heart failure and stage 1 through stage 4 chronic kidney disease, or unspecified chronic kidney disease: Secondary | ICD-10-CM

## 2016-11-25 DIAGNOSIS — N3946 Mixed incontinence: Secondary | ICD-10-CM | POA: Diagnosis not present

## 2016-11-25 DIAGNOSIS — E440 Moderate protein-calorie malnutrition: Secondary | ICD-10-CM | POA: Diagnosis not present

## 2016-11-25 DIAGNOSIS — K219 Gastro-esophageal reflux disease without esophagitis: Secondary | ICD-10-CM | POA: Diagnosis not present

## 2016-11-25 DIAGNOSIS — M8580 Other specified disorders of bone density and structure, unspecified site: Secondary | ICD-10-CM | POA: Diagnosis not present

## 2016-11-25 DIAGNOSIS — R011 Cardiac murmur, unspecified: Secondary | ICD-10-CM | POA: Diagnosis not present

## 2016-11-25 DIAGNOSIS — E871 Hypo-osmolality and hyponatremia: Secondary | ICD-10-CM | POA: Diagnosis not present

## 2016-11-25 DIAGNOSIS — J449 Chronic obstructive pulmonary disease, unspecified: Secondary | ICD-10-CM | POA: Diagnosis not present

## 2016-11-25 DIAGNOSIS — R410 Disorientation, unspecified: Secondary | ICD-10-CM | POA: Diagnosis not present

## 2016-11-25 DIAGNOSIS — I5032 Chronic diastolic (congestive) heart failure: Secondary | ICD-10-CM | POA: Diagnosis not present

## 2016-11-25 LAB — COMPREHENSIVE METABOLIC PANEL
ALK PHOS: 148 U/L — AB (ref 38–126)
ALT: 33 U/L (ref 14–54)
ANION GAP: 14 (ref 5–15)
AST: 42 U/L — ABNORMAL HIGH (ref 15–41)
Albumin: 4.5 g/dL (ref 3.5–5.0)
BILIRUBIN TOTAL: 0.8 mg/dL (ref 0.3–1.2)
BUN: 9 mg/dL (ref 6–20)
CALCIUM: 9.8 mg/dL (ref 8.9–10.3)
CO2: 23 mmol/L (ref 22–32)
Chloride: 91 mmol/L — ABNORMAL LOW (ref 101–111)
Creatinine, Ser: 1 mg/dL (ref 0.44–1.00)
GFR calc non Af Amer: 52 mL/min — ABNORMAL LOW (ref >60)
Glucose, Bld: 112 mg/dL — ABNORMAL HIGH (ref 65–99)
POTASSIUM: 3.3 mmol/L — AB (ref 3.5–5.1)
SODIUM: 128 mmol/L — AB (ref 135–145)
TOTAL PROTEIN: 7.5 g/dL (ref 6.5–8.1)

## 2016-11-25 LAB — URINALYSIS, ROUTINE W REFLEX MICROSCOPIC
Bilirubin Urine: NEGATIVE
Glucose, UA: NEGATIVE mg/dL
Hgb urine dipstick: NEGATIVE
Ketones, ur: NEGATIVE mg/dL
Leukocytes, UA: NEGATIVE
NITRITE: NEGATIVE
Protein, ur: NEGATIVE mg/dL
SPECIFIC GRAVITY, URINE: 1.001 — AB (ref 1.005–1.030)
pH: 8 (ref 5.0–8.0)

## 2016-11-25 LAB — CBC
HCT: 38.3 % (ref 36.0–46.0)
HEMOGLOBIN: 13.1 g/dL (ref 12.0–15.0)
MCH: 29.4 pg (ref 26.0–34.0)
MCHC: 34.2 g/dL (ref 30.0–36.0)
MCV: 85.9 fL (ref 78.0–100.0)
Platelets: 324 10*3/uL (ref 150–400)
RBC: 4.46 MIL/uL (ref 3.87–5.11)
RDW: 14 % (ref 11.5–15.5)
WBC: 8.7 10*3/uL (ref 4.0–10.5)

## 2016-11-25 LAB — LIPASE, BLOOD: Lipase: 19 U/L (ref 11–51)

## 2016-11-25 MED ORDER — IBUPROFEN 400 MG PO TABS
ORAL_TABLET | ORAL | Status: AC
  Administered 2016-11-25: 400 mg
  Filled 2016-11-25: qty 1

## 2016-11-25 NOTE — ED Triage Notes (Signed)
Pt presents to ED for assessment after not being able to have a bowel movement x 4 days.  Pt states she tried an enema at home.  Pt denies any movement.  Pt hyperventilating at triage, very anxious due to pain.

## 2016-11-26 ENCOUNTER — Encounter (HOSPITAL_COMMUNITY): Payer: Self-pay | Admitting: Emergency Medicine

## 2016-11-26 ENCOUNTER — Emergency Department (HOSPITAL_COMMUNITY): Payer: Medicare Other

## 2016-11-26 ENCOUNTER — Observation Stay (HOSPITAL_COMMUNITY)
Admission: EM | Admit: 2016-11-26 | Discharge: 2016-11-28 | Disposition: A | Discharge status: Home / Self Care | Payer: Medicare Other | Attending: Internal Medicine | Admitting: Internal Medicine

## 2016-11-26 DIAGNOSIS — K219 Gastro-esophageal reflux disease without esophagitis: Secondary | ICD-10-CM | POA: Diagnosis not present

## 2016-11-26 DIAGNOSIS — E119 Type 2 diabetes mellitus without complications: Secondary | ICD-10-CM

## 2016-11-26 DIAGNOSIS — E039 Hypothyroidism, unspecified: Secondary | ICD-10-CM | POA: Diagnosis present

## 2016-11-26 DIAGNOSIS — Z7902 Long term (current) use of antithrombotics/antiplatelets: Secondary | ICD-10-CM | POA: Insufficient documentation

## 2016-11-26 DIAGNOSIS — E440 Moderate protein-calorie malnutrition: Secondary | ICD-10-CM | POA: Insufficient documentation

## 2016-11-26 DIAGNOSIS — Z880 Allergy status to penicillin: Secondary | ICD-10-CM | POA: Insufficient documentation

## 2016-11-26 DIAGNOSIS — J449 Chronic obstructive pulmonary disease, unspecified: Secondary | ICD-10-CM | POA: Insufficient documentation

## 2016-11-26 DIAGNOSIS — Z8719 Personal history of other diseases of the digestive system: Secondary | ICD-10-CM | POA: Insufficient documentation

## 2016-11-26 DIAGNOSIS — R011 Cardiac murmur, unspecified: Secondary | ICD-10-CM | POA: Insufficient documentation

## 2016-11-26 DIAGNOSIS — M353 Polymyalgia rheumatica: Secondary | ICD-10-CM | POA: Insufficient documentation

## 2016-11-26 DIAGNOSIS — R404 Transient alteration of awareness: Secondary | ICD-10-CM | POA: Diagnosis not present

## 2016-11-26 DIAGNOSIS — Z90710 Acquired absence of both cervix and uterus: Secondary | ICD-10-CM | POA: Insufficient documentation

## 2016-11-26 DIAGNOSIS — K5900 Constipation, unspecified: Secondary | ICD-10-CM | POA: Diagnosis not present

## 2016-11-26 DIAGNOSIS — Z888 Allergy status to other drugs, medicaments and biological substances status: Secondary | ICD-10-CM | POA: Insufficient documentation

## 2016-11-26 DIAGNOSIS — E1122 Type 2 diabetes mellitus with diabetic chronic kidney disease: Secondary | ICD-10-CM | POA: Insufficient documentation

## 2016-11-26 DIAGNOSIS — R410 Disorientation, unspecified: Secondary | ICD-10-CM | POA: Diagnosis not present

## 2016-11-26 DIAGNOSIS — R42 Dizziness and giddiness: Secondary | ICD-10-CM | POA: Diagnosis not present

## 2016-11-26 DIAGNOSIS — I491 Atrial premature depolarization: Secondary | ICD-10-CM | POA: Insufficient documentation

## 2016-11-26 DIAGNOSIS — I10 Essential (primary) hypertension: Secondary | ICD-10-CM | POA: Diagnosis not present

## 2016-11-26 DIAGNOSIS — I130 Hypertensive heart and chronic kidney disease with heart failure and stage 1 through stage 4 chronic kidney disease, or unspecified chronic kidney disease: Secondary | ICD-10-CM | POA: Insufficient documentation

## 2016-11-26 DIAGNOSIS — I700 Atherosclerosis of aorta: Secondary | ICD-10-CM | POA: Insufficient documentation

## 2016-11-26 DIAGNOSIS — Z886 Allergy status to analgesic agent status: Secondary | ICD-10-CM | POA: Insufficient documentation

## 2016-11-26 DIAGNOSIS — I5032 Chronic diastolic (congestive) heart failure: Secondary | ICD-10-CM | POA: Insufficient documentation

## 2016-11-26 DIAGNOSIS — R531 Weakness: Secondary | ICD-10-CM | POA: Diagnosis not present

## 2016-11-26 DIAGNOSIS — R4182 Altered mental status, unspecified: Secondary | ICD-10-CM | POA: Diagnosis not present

## 2016-11-26 DIAGNOSIS — M8580 Other specified disorders of bone density and structure, unspecified site: Secondary | ICD-10-CM | POA: Insufficient documentation

## 2016-11-26 DIAGNOSIS — N3946 Mixed incontinence: Secondary | ICD-10-CM | POA: Insufficient documentation

## 2016-11-26 DIAGNOSIS — R630 Anorexia: Secondary | ICD-10-CM

## 2016-11-26 DIAGNOSIS — Z91018 Allergy to other foods: Secondary | ICD-10-CM | POA: Insufficient documentation

## 2016-11-26 DIAGNOSIS — Z681 Body mass index (BMI) 19 or less, adult: Secondary | ICD-10-CM | POA: Insufficient documentation

## 2016-11-26 DIAGNOSIS — E871 Hypo-osmolality and hyponatremia: Secondary | ICD-10-CM | POA: Diagnosis not present

## 2016-11-26 DIAGNOSIS — R634 Abnormal weight loss: Secondary | ICD-10-CM

## 2016-11-26 DIAGNOSIS — I160 Hypertensive urgency: Secondary | ICD-10-CM | POA: Diagnosis present

## 2016-11-26 DIAGNOSIS — R251 Tremor, unspecified: Secondary | ICD-10-CM | POA: Insufficient documentation

## 2016-11-26 DIAGNOSIS — R911 Solitary pulmonary nodule: Secondary | ICD-10-CM | POA: Insufficient documentation

## 2016-11-26 DIAGNOSIS — H35033 Hypertensive retinopathy, bilateral: Secondary | ICD-10-CM | POA: Insufficient documentation

## 2016-11-26 DIAGNOSIS — I152 Hypertension secondary to endocrine disorders: Secondary | ICD-10-CM | POA: Diagnosis present

## 2016-11-26 DIAGNOSIS — N183 Chronic kidney disease, stage 3 (moderate): Secondary | ICD-10-CM | POA: Insufficient documentation

## 2016-11-26 DIAGNOSIS — Z885 Allergy status to narcotic agent status: Secondary | ICD-10-CM | POA: Insufficient documentation

## 2016-11-26 DIAGNOSIS — E785 Hyperlipidemia, unspecified: Secondary | ICD-10-CM | POA: Diagnosis present

## 2016-11-26 DIAGNOSIS — I2510 Atherosclerotic heart disease of native coronary artery without angina pectoris: Secondary | ICD-10-CM | POA: Diagnosis present

## 2016-11-26 LAB — URINALYSIS, ROUTINE W REFLEX MICROSCOPIC
Bilirubin Urine: NEGATIVE
Glucose, UA: NEGATIVE mg/dL
Hgb urine dipstick: NEGATIVE
Ketones, ur: NEGATIVE mg/dL
Leukocytes, UA: NEGATIVE
NITRITE: NEGATIVE
Protein, ur: NEGATIVE mg/dL
SPECIFIC GRAVITY, URINE: 1.001 — AB (ref 1.005–1.030)
pH: 8 (ref 5.0–8.0)

## 2016-11-26 LAB — CBC
HCT: 37.1 % (ref 36.0–46.0)
HEMOGLOBIN: 13 g/dL (ref 12.0–15.0)
MCH: 29.7 pg (ref 26.0–34.0)
MCHC: 35 g/dL (ref 30.0–36.0)
MCV: 84.7 fL (ref 78.0–100.0)
Platelets: 313 10*3/uL (ref 150–400)
RBC: 4.38 MIL/uL (ref 3.87–5.11)
RDW: 13.5 % (ref 11.5–15.5)
WBC: 6.9 10*3/uL (ref 4.0–10.5)

## 2016-11-26 LAB — BASIC METABOLIC PANEL
Anion gap: 12 (ref 5–15)
BUN: 8 mg/dL (ref 6–20)
CALCIUM: 9.7 mg/dL (ref 8.9–10.3)
CHLORIDE: 87 mmol/L — AB (ref 101–111)
CO2: 22 mmol/L (ref 22–32)
CREATININE: 0.99 mg/dL (ref 0.44–1.00)
GFR calc Af Amer: >60 mL/min (ref >60)
GFR calc non Af Amer: 52 mL/min — ABNORMAL LOW (ref >60)
Glucose, Bld: 168 mg/dL — ABNORMAL HIGH (ref 65–99)
Potassium: 3.9 mmol/L (ref 3.5–5.1)
SODIUM: 121 mmol/L — AB (ref 135–145)

## 2016-11-26 LAB — I-STAT TROPONIN, ED: Troponin i, poc: 0.02 ng/mL (ref 0.00–0.08)

## 2016-11-26 LAB — CBG MONITORING, ED: GLUCOSE-CAPILLARY: 178 mg/dL — AB (ref 65–99)

## 2016-11-26 LAB — TSH: TSH: 2.136 u[IU]/mL (ref 0.350–4.500)

## 2016-11-26 MED ORDER — LABETALOL HCL 5 MG/ML IV SOLN
10.0000 mg | Freq: Once | INTRAVENOUS | Status: AC
  Administered 2016-11-26: 10 mg via INTRAVENOUS
  Filled 2016-11-26: qty 4

## 2016-11-26 MED ORDER — IRBESARTAN 150 MG PO TABS: 150.0000 mg | ORAL_TABLET | Freq: Every day | ORAL | Status: DC

## 2016-11-26 MED ORDER — METOPROLOL SUCCINATE ER 25 MG PO TB24: 25.0000 mg | ORAL_TABLET | Freq: Every day | ORAL | Status: DC

## 2016-11-26 MED ORDER — SODIUM CHLORIDE 0.9 % IV BOLUS (SEPSIS): 1000.0000 mL | Freq: Once | INTRAVENOUS | Status: DC

## 2016-11-26 MED ORDER — SODIUM CHLORIDE 0.9 % IV BOLUS (SEPSIS)
1000.0000 mL | Freq: Once | INTRAVENOUS | Status: AC
  Administered 2016-11-26: 1000 mL via INTRAVENOUS

## 2016-11-26 MED ORDER — HYDRALAZINE HCL 20 MG/ML IJ SOLN
5.0000 mg | INTRAMUSCULAR | Status: DC | PRN
  Administered 2016-11-28: 5 mg via INTRAVENOUS
  Filled 2016-11-26 (×2): qty 1

## 2016-11-26 NOTE — ED Notes (Signed)
Called for patient x 2 with no response

## 2016-11-26 NOTE — ED Triage Notes (Signed)
Patient arrived with EMS from home reports elevated blood pressure today 248/122 , family reported recent adjustments on her blood pressure medication but unable to control it , daughter added fatigue /generalized weakness with confusion .

## 2016-11-26 NOTE — ED Notes (Signed)
Patient transported to CT scan . 

## 2016-11-26 NOTE — ED Provider Notes (Signed)
[facility] DEPT Provider Note   CSN: [id number] Arrival date & time: 11/26/16  2111     History   Chief Complaint Chief Complaint  Patient presents with  . Hypertension    HPI Christine [name] is a 83 y.o. female.  The history is provided by the patient and a relative.  Altered Mental Status   This is a new problem. The current episode started 6 to 12 hours ago. The problem has been gradually worsening. Associated symptoms include confusion and somnolence. Pertinent negatives include no weakness. Her past medical history is significant for hypertension and heart disease. Her past medical history does not include CVA, TIA, COPD, dementia (has some memory issues) or head trauma.    Past Medical History:  Diagnosis Date  . Anemia   . Anginal pain ([place])   . Arthritis    "back, arms, hips" (09/23/2013)  . Benign hypertensive heart and kidney disease with diastolic CHF, NYHA class II and CKD stage III ([place])   . Blood transfusion 1972   "after daughter born, attempted to give me blood; couldn't give it cause my blood was cold" (09/23/2013)  . CAD (coronary artery disease)    a. LHC 08/23/11: dLM 40-50%, oRI 40%, oCFX 40%, oD1 70% (small and not amenable to PCI).  LM lesion did not appear to be flow limiting.  Medical rx was recommended;  b. Echo 08/23/11: mild LVH, EF 60-65%, grade 1 diast dysfxn, mild BAE, PASP 24;  c. 02/2012  Cath: LM 50d, LAD 50p, D1 70ost, RI 70p, RCA ok->Med Rx;  d. 01/2013 Cardiolite: EF 88, no ischemia/infarct.  . Carotid stenosis    a. dopplers 10/12:  0-39% bilat ICA;  b. 10/2012 U/S: 0-39% bilat, f/u 1 yr (10/2013).  . Chronic bronchitis   . Diverticulosis of colon (without mention of hemorrhage)   . GERD (gastroesophageal reflux disease)   . Heart murmur, systolic    2-D echo in December 2011 showed a normal EF with grade 1 diastolic dysfunction, trivial pulmonary regurgitation and mildly elevated PA pressure at 37 mmHg probably secondary to her COPD.dynamic  obstruction-mid cavity obliteration;  b. 02/2012 Echo: EF 60-65%, mild LVH, PASP 82mmHg.  Marland Kitchen History of migraine headaches    a. Next atypical symptoms in the past. Patient was started on Neurontin for possible neuropathic origin of her pain.  Marland Kitchen History of stomach ulcers 1970's  . Hyperlipidemia   . Hypertension   . Hypothyroidism   . Migraines   . Polymyalgia rheumatica ([place])   . Shortness of breath on exertion    "just related to angina >1 yr ago" (09/23/2013)  . Sinus arrhythmia   . Type II diabetes mellitus ([place])    a. On oral hypoglycemic agents.    Patient Active Problem List   Diagnosis Date Noted  . Elevated alkaline phosphatase level 09/18/2016  . Mixed incontinence urge and stress 05/09/2016  . Dizziness 04/11/2016  . Exertional dyspnea 09/11/2015  . Hypertensive retinopathy of both eyes, grade 2 04/20/2015  . Lumbar spondylosis 06/16/2014  . Carpal tunnel syndrome 02/18/2014  . Right shoulder pain 02/02/2014  . Osteopenia 01/11/2013  . Routine adult health maintenance 01/11/2013  . Chronic renal insufficiency, stage III (moderate) 09/21/2012  . Recurrent cough 09/18/2012  . Diverticulosis 09/18/2012  . Allergic rhinitis 02/13/2012  . Carotid stenosis 09/25/2011  . Coronary artery disease 08/29/2011  . HEART MURMUR, SYSTOLIC [id number]  . Enlargement of clavicle 03/30/2008  . Solitary pulmonary nodule 12/15/2006  . Hyperlipidemia 11/28/2006  .  Hypothyroidism 09/04/2006  . Type 2 diabetes mellitus, controlled ([place]) 09/04/2006  . Migraine 09/04/2006  . Essential hypertension 09/04/2006  . GERD 09/04/2006    Past Surgical History:  Procedure Laterality Date  . CARDIAC CATHETERIZATION N/A 09/26/2015   Procedure: Right/Left Heart Cath and Coronary Angiography;  Surgeon: Larey Dresser, MD;  Location: [facility];  Service: Cardiovascular;  Laterality: N/A;  . CATARACT EXTRACTION W/ INTRAOCULAR LENS  IMPLANT, BILATERAL Bilateral 1990's  . LEFT HEART  CATHETERIZATION WITH CORONARY ANGIOGRAM N/A 03/16/2012   Procedure: LEFT HEART CATHETERIZATION WITH CORONARY ANGIOGRAM;  Surgeon: Hillary Bow, MD;  Location[facility] C[facility];  Service: Cardiovascular;  Laterality: N/A;  . TOTAL ABDOMINAL HYSTERECTOMY  1976    OB History    No data available       Home Medications    Prior to Admission medications   Medication Sig Start Date End Date Taking? Authorizing Provider  acetaminophen (TYLENOL) 500 MG tablet Take 1,000 mg by mouth every 6 (six) hours as needed for headache. For pain    Historical Provider, MD  ADVAIR DISKUS 250-50 MCG/DOSE AEPB inhale 1 dose by mouth every 12 hours 09/12/16   [name], MD  Ascorbic Acid (VITAMIN C PO) Take 2 tablets by mouth daily.    Historical Provider, MD  atorvastatin (LIPITOR) 40 MG tablet Take 1 tablet (40 mg total) by mouth every evening. 03/08/16   Oval Linsey, MD  carbamide peroxide (DEBROX) 6.5 % otic solution Place 5 drops into the left ear 2 (two) times daily. 10/02/16 10/02/17  [name], MD  clopidogrel (PLAVIX) 75 MG tablet take 1 tablet by mouth every evening 10/14/16   [name], MD  esomeprazole (NEXIUM) 40 MG capsule take 1 capsule by mouth once daily BEFORE BREAKFAST 08/06/16   Nelida Meuse III, MD  fluticasone (FLONASE) 50 MCG/ACT nasal spray Place 1 spray into both nostrils daily. 11/08/15 11/07/16  [name], MD  glimepiride (AMARYL) 1 MG tablet take 1 tablet by mouth once daily BEFORE BREAKFAST 08/16/16   [name], MD  glucose blood (ONE TOUCH ULTRA TEST) test strip 1 each by Other route as needed for other. Use 1 to 2 times daily to check blood sugar. diag code 250.02. Non-insulin dependent 11/08/15   [name], MD  guaiFENesin-dextromethorphan [facility] DM) 100-10 MG/5ML syrup Take 5 mLs by mouth every 4 (four) hours as needed for cough. 10/02/16   [name], MD  hydroxypropyl methylcellulose (ISOPTO TEARS) 2.5 % ophthalmic solution Place 1 drop into  both eyes 2 (two) times daily.     Historical Provider, MD  imipramine (TOFRANIL) 25 MG tablet Take 1 tablet (25 mg total) by mouth 2 (two) times daily. 10/02/16   [name], MD  isosorbide mononitrate (IMDUR) 30 MG 24 hr tablet Take 30 mg by mouth daily.    Historical Provider, MD  levothyroxine (SYNTHROID, LEVOTHROID) 25 MCG tablet take 1 tablet by mouth once daily 08/05/16   [name], MD  loratadine (CLARITIN) 10 MG tablet Take 10 mg by mouth daily as needed for allergies, rhinitis or itching.    Historical Provider, MD  metoprolol succinate (TOPROL XL) 25 MG 24 hr tablet Take 1 tablet (25 mg total) by mouth daily. 10/31/16   Larey Dresser, MD  NITROSTAT 0.4 MG SL tablet place 1 tablet under the tongue if needed every 5 minutes for chest pain for 3 doses IF NO RELIEF AFTER 3RD  DOSE CALL PRESCRIBER OR 911. 09/09/16   [name], MD  [facility] DELICA LANCETS 99991111 MISC Inject 1 each into the skin as needed. Use 1 to 2 times daily to check blood sugar. diag code 250.02. Non-insulin dependent 06/08/14   [name], MD  Pediatric Multiple Vitamins (CHEWABLE MULTIPLE VITAMINS PO) Take 2 tablets by mouth daily.    Historical Provider, MD  pyridostigmine (MESTINON) 60 MG tablet 1/2 tablet (30mg ) two times 10/09/16   Larey Dresser, MD  valsartan (DIOVAN) 160 MG tablet Take 1 tablet (160 mg total) by mouth daily. 04/19/16   Almyra Deforest, PA    Family History Family History  Problem Relation Age of Onset  . Colon cancer Maternal Grandmother   . Heart attack Maternal Grandmother   . COPD Father   . Other Mother     died of unknown causes in her 5's. Pt raised by grandmother.  . Pulmonary Hypertension Daughter     Social History Social History  Substance Use Topics  . Smoking status: Never Smoker  . Smokeless tobacco: Never Used  . Alcohol use No     Comment: "Last drink 1967"     Allergies   Aspirin; Codeine; Penicillins; Nsaids; and Other   Review of Systems Review of  Systems  Constitutional: Negative for chills and fever.  Eyes: Negative for visual disturbance.  Respiratory: Negative for cough and shortness of breath.   Cardiovascular: Negative for chest pain and palpitations.  Gastrointestinal: Positive for constipation (took a lot of laxatives and had loose stool). Negative for abdominal pain, nausea and vomiting.  Genitourinary: Negative for dysuria.  Skin: Negative for rash.  Neurological: Negative for dizziness, syncope, facial asymmetry, speech difficulty, weakness, light-headedness, numbness and headaches.  Psychiatric/Behavioral: Positive for confusion.  All other systems reviewed and are negative.    Physical Exam Updated Vital Signs BP 189/83   Pulse 74   Resp 21   LMP 02/11/1975   SpO2 100%   Physical Exam  Constitutional: She appears well-developed and well-nourished. No distress.  HENT:  Head: Normocephalic and atraumatic.  Mouth/Throat: Oropharynx is clear and moist.  Eyes: Conjunctivae and EOM are normal. Pupils are equal, round, and reactive to light.  Neck: Neck supple.  Cardiovascular: Normal rate and regular rhythm.   Murmur heard.  Systolic murmur is present with a grade of 2/6  Pulmonary/Chest: Effort normal and breath sounds normal. No respiratory distress. She has no wheezes. She has no rales.  Abdominal: Soft. There is no tenderness.  Musculoskeletal: She exhibits no edema.  Neurological: She is alert. She has normal strength. She is disoriented (CAO to self and place but not time). No cranial nerve deficit or sensory deficit. GCS eye subscore is 4. GCS verbal subscore is 5. GCS motor subscore is 6.  5/5 strength all 4 ext.  Skin: Skin is warm and dry.  Psychiatric: She has a normal mood and affect.  Nursing note and vitals reviewed.    ED Treatments / Results  Labs (all labs ordered are listed, but only abnormal results are displayed) Labs Reviewed  BASIC METABOLIC PANEL - Abnormal; Notable for the  following:       Result Value   Sodium 121 (*)    Chloride 87 (*)    Glucose, Bld 168 (*)    GFR calc non Af Amer 52 (*)    All other components within normal limits  CBG MONITORING, ED - Abnormal; Notable for the following:    Glucose-Capillary 178 (*)  All other components within normal limits  CBC  TSH  I-STAT TROPOININ, ED    EKG  EKG Interpretation None       Radiology Ct Head Wo Contrast  Result Date: 11/26/2016 CLINICAL DATA:  Mental status change.  Hypertension. EXAM: CT HEAD WITHOUT CONTRAST TECHNIQUE: Contiguous axial images were obtained from the base of the skull through the vertex without intravenous contrast. COMPARISON:  09/18/2014 FINDINGS: Brain: No evidence of acute infarction, hemorrhage, hydrocephalus, extra-axial collection or mass lesion/mass effect. Vascular: No hyperdense vessel or unexpected calcification. Skull: Normal. Negative for fracture or focal lesion. Sinuses/Orbits: Postoperative bilateral antrectomy. Paranasal sinuses and mastoid air cells are clear. Other: None. IMPRESSION: No acute intracranial abnormalities. Electronically Signed   By: Lucienne Capers M.D.   On: 11/26/2016 22:29    Procedures Procedures (including critical care time)  Medications Ordered in ED Medications  sodium chloride 0.9 % bolus 1,000 mL (1,000 mLs Intravenous New Bag/Given 11/26/16 2246)  irbesartan (AVAPRO) tablet 150 mg (not administered)  metoprolol succinate (TOPROL-XL) 24 hr tablet 25 mg (not administered)  labetalol (NORMODYNE,TRANDATE) injection 10 mg (10 mg Intravenous Given 11/26/16 2202)     Initial Impression / Assessment and Plan / ED Course  I have reviewed the triage vital signs and the nursing notes.  Pertinent labs & imaging results that were available during my care of the patient were reviewed by me and considered in my medical decision making (see chart for details).  Clinical Course    Patient is a 83 year old female who presents with altered  mental status and hypertension. Patient's blood pressures were A999333 systolic at home and was 0000000 systolic with EMS as well. Patient has not taken her blood pressure pills today.  Both of the patient's children noticed the patient seemed to be altered and had significant mental slowing today and was not talking normally. Patient was also noted to be very somnolent today whereas she has typically very talkative and interactive.   Patient has had no fevers today, no cough or urinary symptoms or GI symptoms. Initial blood pressure on arrival was in the 123456 systolic. Patient denies any headache, vision changes, chest pain, shortness of breath. Neuro exam is benign and shows no focal deficits.  Concern for possible PRES vs neurologic vs metabolic source of AMS.  Patient was waiting to be seen in the ED yesterday however due to the prolonged wait patient left AMA. Patient was having issues with constipation for the past week. At home patient was given stool softeners, MiraLAX, enemas and had a large loose bowel movement today but was unable to make it to the restroom. Family had to clean her up.  Patient found to have worsening hyponatremia of 121 today. Sodium was 128 yesterday. This acute decline is likely secondary to using laxatives and enemas. This also likely explains her change in mental status. There still may be a component of PRES as well given HTN to 0000000 systolic. Patient was given labetalol IV with improvement in blood pressure to A999333 systolic. Home blood pressure medications ordered including Toprol and irbesartan to maintain blood pressure control. Will check UA as well to r/o infection. CT head was normal. Doubt posterior stroke. Troponin negative. EKG showed sinus rhythm with atrial bigeminy, no acute ischemic changes.  Patient to be admitted to medicine for management of hyponatremia and monitoring for improvement in mentation.  Pt seen with attending Dr. Stark Jock.    Final Clinical  Impressions(s) / ED Diagnoses   Final diagnoses:  Hyponatremia  Altered mental status, unspecified altered mental status type    New Prescriptions New Prescriptions   No medications on file     Tobie Poet, DO 11/26/16 [place], MD 11/29/16 [phone]

## 2016-11-26 NOTE — ED Notes (Signed)
Called for Pt. No Answer 

## 2016-11-27 ENCOUNTER — Observation Stay (HOSPITAL_COMMUNITY): Payer: Medicare Other

## 2016-11-27 DIAGNOSIS — E871 Hypo-osmolality and hyponatremia: Secondary | ICD-10-CM

## 2016-11-27 DIAGNOSIS — R4182 Altered mental status, unspecified: Secondary | ICD-10-CM

## 2016-11-27 DIAGNOSIS — K5909 Other constipation: Secondary | ICD-10-CM | POA: Diagnosis not present

## 2016-11-27 DIAGNOSIS — K5900 Constipation, unspecified: Secondary | ICD-10-CM

## 2016-11-27 DIAGNOSIS — I160 Hypertensive urgency: Secondary | ICD-10-CM | POA: Diagnosis present

## 2016-11-27 DIAGNOSIS — I10 Essential (primary) hypertension: Secondary | ICD-10-CM

## 2016-11-27 DIAGNOSIS — I2510 Atherosclerotic heart disease of native coronary artery without angina pectoris: Secondary | ICD-10-CM

## 2016-11-27 DIAGNOSIS — E440 Moderate protein-calorie malnutrition: Secondary | ICD-10-CM | POA: Diagnosis not present

## 2016-11-27 DIAGNOSIS — R630 Anorexia: Secondary | ICD-10-CM

## 2016-11-27 DIAGNOSIS — R531 Weakness: Secondary | ICD-10-CM | POA: Diagnosis not present

## 2016-11-27 LAB — BASIC METABOLIC PANEL
ANION GAP: 7 (ref 5–15)
ANION GAP: 8 (ref 5–15)
Anion gap: 11 (ref 5–15)
BUN: 7 mg/dL (ref 6–20)
BUN: 7 mg/dL (ref 6–20)
BUN: 9 mg/dL (ref 6–20)
CHLORIDE: 98 mmol/L — AB (ref 101–111)
CHLORIDE: 100 mmol/L — AB (ref 101–111)
CHLORIDE: 101 mmol/L (ref 101–111)
CO2: 20 mmol/L — AB (ref 22–32)
CO2: 24 mmol/L (ref 22–32)
CO2: 23 mmol/L (ref 22–32)
CREATININE: 0.88 mg/dL (ref 0.44–1.00)
Calcium: 9.6 mg/dL (ref 8.9–10.3)
Calcium: 9.3 mg/dL (ref 8.9–10.3)
Calcium: 9.5 mg/dL (ref 8.9–10.3)
Creatinine, Ser: 0.95 mg/dL (ref 0.44–1.00)
Creatinine, Ser: 1 mg/dL (ref 0.44–1.00)
GFR calc Af Amer: >60 mL/min (ref >60)
GFR calc Af Amer: >60 mL/min (ref >60)
GFR calc non Af Amer: >60 mL/min (ref >60)
GFR calc non Af Amer: 55 mL/min — ABNORMAL LOW (ref >60)
GFR calc non Af Amer: 52 mL/min — ABNORMAL LOW (ref >60)
GLUCOSE: 118 mg/dL — AB (ref 65–99)
GLUCOSE: 194 mg/dL — AB (ref 65–99)
Glucose, Bld: 136 mg/dL — ABNORMAL HIGH (ref 65–99)
POTASSIUM: 3.7 mmol/L (ref 3.5–5.1)
Potassium: 3.7 mmol/L (ref 3.5–5.1)
Potassium: 4.1 mmol/L (ref 3.5–5.1)
Sodium: 129 mmol/L — ABNORMAL LOW (ref 135–145)
Sodium: 131 mmol/L — ABNORMAL LOW (ref 135–145)
Sodium: 132 mmol/L — ABNORMAL LOW (ref 135–145)

## 2016-11-27 LAB — CBC
HEMATOCRIT: 34.8 % — AB (ref 36.0–46.0)
Hemoglobin: 12.1 g/dL (ref 12.0–15.0)
MCH: 29.7 pg (ref 26.0–34.0)
MCHC: 34.8 g/dL (ref 30.0–36.0)
MCV: 85.5 fL (ref 78.0–100.0)
PLATELETS: 297 10*3/uL (ref 150–400)
RBC: 4.07 MIL/uL (ref 3.87–5.11)
RDW: 13.8 % (ref 11.5–15.5)
WBC: 6.4 10*3/uL (ref 4.0–10.5)

## 2016-11-27 LAB — GLUCOSE, CAPILLARY
GLUCOSE-CAPILLARY: 117 mg/dL — AB (ref 65–99)
GLUCOSE-CAPILLARY: 152 mg/dL — AB (ref 65–99)
Glucose-Capillary: 154 mg/dL — ABNORMAL HIGH (ref 65–99)
Glucose-Capillary: 96 mg/dL (ref 65–99)

## 2016-11-27 LAB — OSMOLALITY, URINE: Osmolality, Ur: 97 mOsm/kg — ABNORMAL LOW (ref 300–900)

## 2016-11-27 LAB — OSMOLALITY: Osmolality: 269 mOsm/kg — ABNORMAL LOW (ref 275–295)

## 2016-11-27 LAB — SODIUM, URINE, RANDOM: Sodium, Ur: 19 mmol/L

## 2016-11-27 MED ORDER — PANTOPRAZOLE SODIUM 40 MG PO TBEC
40.0000 mg | DELAYED_RELEASE_TABLET | Freq: Every day | ORAL | Status: DC
  Administered 2016-11-27 – 2016-11-28 (×2): 40 mg via ORAL
  Filled 2016-11-27 (×2): qty 1

## 2016-11-27 MED ORDER — ENOXAPARIN SODIUM 40 MG/0.4ML ~~LOC~~ SOLN
40.0000 mg | SUBCUTANEOUS | Status: DC
  Administered 2016-11-27 – 2016-11-28 (×2): 40 mg via SUBCUTANEOUS
  Filled 2016-11-27 (×2): qty 0.4

## 2016-11-27 MED ORDER — IMIPRAMINE HCL 25 MG PO TABS
25.0000 mg | ORAL_TABLET | Freq: Two times a day (BID) | ORAL | Status: DC
  Filled 2016-11-27: qty 1

## 2016-11-27 MED ORDER — POLYETHYLENE GLYCOL 3350 17 G PO PACK
17.0000 g | PACK | Freq: Every day | ORAL | Status: DC
  Administered 2016-11-27 – 2016-11-28 (×2): 17 g via ORAL
  Filled 2016-11-27: qty 1

## 2016-11-27 MED ORDER — ACETAMINOPHEN 325 MG PO TABS: 650.0000 mg | ORAL_TABLET | Freq: Four times a day (QID) | ORAL | Status: DC | PRN

## 2016-11-27 MED ORDER — AMLODIPINE BESYLATE 5 MG PO TABS
10.0000 mg | ORAL_TABLET | Freq: Every day | ORAL | Status: DC
  Administered 2016-11-27 – 2016-11-28 (×2): 10 mg via ORAL
  Filled 2016-11-27 (×3): qty 2

## 2016-11-27 MED ORDER — INSULIN ASPART 100 UNIT/ML ~~LOC~~ SOLN: 0.0000 [IU] | Freq: Every day | SUBCUTANEOUS | Status: DC

## 2016-11-27 MED ORDER — POLYETHYLENE GLYCOL 3350 17 G PO PACK: 17.0000 g | PACK | Freq: Every day | ORAL | Status: DC | PRN

## 2016-11-27 MED ORDER — VITAMIN C 500 MG PO TABS
500.0000 mg | ORAL_TABLET | Freq: Every day | ORAL | Status: DC
  Administered 2016-11-27 – 2016-11-28 (×2): 500 mg via ORAL
  Filled 2016-11-27 (×2): qty 1

## 2016-11-27 MED ORDER — ZOLPIDEM TARTRATE 5 MG PO TABS
5.0000 mg | ORAL_TABLET | Freq: Every evening | ORAL | Status: DC | PRN
  Administered 2016-11-27: 5 mg via ORAL
  Filled 2016-11-27: qty 1

## 2016-11-27 MED ORDER — PYRIDOSTIGMINE BROMIDE 60 MG PO TABS
30.0000 mg | ORAL_TABLET | Freq: Two times a day (BID) | ORAL | Status: DC
  Filled 2016-11-27: qty 0.5

## 2016-11-27 MED ORDER — CARBAMIDE PEROXIDE 6.5 % OT SOLN
5.0000 [drp] | Freq: Two times a day (BID) | OTIC | Status: DC
  Filled 2016-11-27: qty 15

## 2016-11-27 MED ORDER — ONDANSETRON HCL 4 MG PO TABS
4.0000 mg | ORAL_TABLET | Freq: Four times a day (QID) | ORAL | Status: DC | PRN
  Administered 2016-11-28: 4 mg via ORAL
  Filled 2016-11-27: qty 1

## 2016-11-27 MED ORDER — LEVOTHYROXINE SODIUM 25 MCG PO TABS
25.0000 ug | ORAL_TABLET | Freq: Every day | ORAL | Status: DC
  Administered 2016-11-27 – 2016-11-28 (×2): 25 ug via ORAL
  Filled 2016-11-27 (×3): qty 1

## 2016-11-27 MED ORDER — INSULIN ASPART 100 UNIT/ML ~~LOC~~ SOLN
0.0000 [IU] | Freq: Three times a day (TID) | SUBCUTANEOUS | Status: DC
Start: 2016-11-27 — End: 2016-11-28

## 2016-11-27 MED ORDER — DOCUSATE SODIUM 100 MG PO CAPS: 100.0000 mg | ORAL_CAPSULE | Freq: Two times a day (BID) | ORAL | Status: DC | PRN

## 2016-11-27 MED ORDER — METOPROLOL SUCCINATE ER 25 MG PO TB24
12.5000 mg | ORAL_TABLET | Freq: Every day | ORAL | Status: DC
  Administered 2016-11-27 – 2016-11-28 (×2): 12.5 mg via ORAL
  Filled 2016-11-27 (×2): qty 1

## 2016-11-27 MED ORDER — SODIUM CHLORIDE 0.9 % IV SOLN
INTRAVENOUS | Status: DC
  Administered 2016-11-27: 01:00:00 via INTRAVENOUS

## 2016-11-27 MED ORDER — SENNA 8.6 MG PO TABS
1.0000 | ORAL_TABLET | Freq: Every day | ORAL | Status: DC
  Administered 2016-11-27: 8.6 mg via ORAL
  Filled 2016-11-27: qty 1

## 2016-11-27 MED ORDER — ATORVASTATIN CALCIUM 40 MG PO TABS
40.0000 mg | ORAL_TABLET | Freq: Every evening | ORAL | Status: DC
  Administered 2016-11-27: 40 mg via ORAL
  Filled 2016-11-27: qty 1

## 2016-11-27 MED ORDER — ONDANSETRON HCL 4 MG/2ML IJ SOLN: 4.0000 mg | Freq: Four times a day (QID) | INTRAMUSCULAR | Status: DC | PRN

## 2016-11-27 MED ORDER — ISOSORBIDE MONONITRATE ER 30 MG PO TB24
30.0000 mg | ORAL_TABLET | Freq: Every day | ORAL | Status: DC
  Administered 2016-11-27 – 2016-11-28 (×2): 30 mg via ORAL
  Filled 2016-11-27 (×2): qty 1

## 2016-11-27 MED ORDER — CALCIUM POLYCARBOPHIL 625 MG PO TABS
625.0000 mg | ORAL_TABLET | Freq: Every day | ORAL | Status: DC
  Administered 2016-11-27: 625 mg via ORAL
  Filled 2016-11-27 (×2): qty 1

## 2016-11-27 MED ORDER — ADULT MULTIVITAMIN W/MINERALS CH
1.0000 | ORAL_TABLET | Freq: Every day | ORAL | Status: DC
  Administered 2016-11-27 – 2016-11-28 (×2): 1 via ORAL
  Filled 2016-11-27: qty 1

## 2016-11-27 MED ORDER — HYPROMELLOSE (GONIOSCOPIC) 2.5 % OP SOLN
1.0000 [drp] | Freq: Two times a day (BID) | OPHTHALMIC | Status: DC
  Filled 2016-11-27: qty 15

## 2016-11-27 MED ORDER — NITROGLYCERIN 0.4 MG SL SUBL: 0.4000 mg | SUBLINGUAL_TABLET | SUBLINGUAL | Status: DC | PRN

## 2016-11-27 MED ORDER — MOMETASONE FURO-FORMOTEROL FUM 200-5 MCG/ACT IN AERO
2.0000 | INHALATION_SPRAY | Freq: Two times a day (BID) | RESPIRATORY_TRACT | Status: DC
  Administered 2016-11-27 – 2016-11-28 (×3): 2 via RESPIRATORY_TRACT
  Filled 2016-11-27: qty 8.8

## 2016-11-27 MED ORDER — FLUTICASONE PROPIONATE 50 MCG/ACT NA SUSP
1.0000 | Freq: Every day | NASAL | Status: DC
  Filled 2016-11-27: qty 16

## 2016-11-27 MED ORDER — LORATADINE 10 MG PO TABS: 10.0000 mg | ORAL_TABLET | Freq: Every day | ORAL | Status: DC | PRN

## 2016-11-27 MED ORDER — GUAIFENESIN-DM 100-10 MG/5ML PO SYRP: 5.0000 mL | ORAL_SOLUTION | ORAL | Status: DC | PRN

## 2016-11-27 MED ORDER — ENSURE ENLIVE PO LIQD
237.0000 mL | Freq: Two times a day (BID) | ORAL | Status: DC
  Administered 2016-11-27 – 2016-11-28 (×2): 237 mL via ORAL

## 2016-11-27 MED ORDER — SODIUM CHLORIDE 0.9% FLUSH
3.0000 mL | Freq: Two times a day (BID) | INTRAVENOUS | Status: DC
  Administered 2016-11-27 – 2016-11-28 (×3): 3 mL via INTRAVENOUS

## 2016-11-27 MED ORDER — CLOPIDOGREL BISULFATE 75 MG PO TABS
75.0000 mg | ORAL_TABLET | Freq: Every evening | ORAL | Status: DC
  Administered 2016-11-27: 75 mg via ORAL
  Filled 2016-11-27: qty 1

## 2016-11-27 NOTE — H&P (Signed)
Date: 11/27/2016               Patient Name:  Christine [name] MRN: [id number]  DOB: 01/24/1934 Age / Sex: 83 y.o., female   PCP: [name], MD         Medical Service: Internal Medicine Teaching Service         Attending Physician: Dr. [name], MD    First Contact: Dr. Wynetta Emery Pager: [phone]  Second Contact: Dr. Tiburcio Pea Pager: [phone]       After Hours (After 5p/  First Contact Pager: [phone]  weekends / holidays): Second Contact Pager: [phone]   Chief Complaint: confusion  History of Present Illness:  Ms. Remsen is an 83yo female with PMH of HTN, HLD, hypothyroidism, CAD, T2DM, COPD, CKD, GERD presenting with change in mental status. Patient was seen and evaluated by [facility], but transferred to our service at [place] since she is an [facility] patient.   Patient states that she has been having some confusion lately but is unable to state when it began. Per chart review, family states patient had decreased interaction, delayed speech and mental slowing that they noticed day of admission. Recently she has been having constipation and had apparently been treated with stool softeners, miralax, and enemas resulting in a large bowel movement. Otherwise no changes to her usual state of health. On interview, patient endorses new tremors; she endorses a fall a week or so ago and she thinks she hit her head. Otherwise patient denies headache, vision or hearing, numbness, tingling, abdominal pain, cough, chest pain, worsening shortness of breath from baseline, worsening dizziness from baseline, nausea, vomiting, dysuria, hematuria. She states she drinks one gallon of water over 3 days; endorses clear urine and frequency (though unable to state # of episodes) without urgency.   In the ED she was found to be hyponatremic to 121. She was started given 1L NS bolus and started on 24ml/hr NS. In the ED she was oriented x2. Patient was hypertensive to 0000000 systolic.  Meds:  No outpatient prescriptions  have been marked as taking for the 11/26/16 encounter [facility] Encounter).    Allergies: Allergies as of 11/26/2016 - Review Complete 11/26/2016  Allergen Reaction Noted  . Aspirin Swelling and Other (See Comments)   . Codeine Nausea And Vomiting and Other (See Comments)   . Penicillins Diarrhea 12/29/2011  . Nsaids Other (See Comments) 11/25/2016  . Other  05/08/2016   Past Medical History:  Diagnosis Date  . Anemia   . Anginal pain ([place])   . Arthritis    "back, arms, hips" (09/23/2013)  . Benign hypertensive heart and kidney disease with diastolic CHF, NYHA class II and CKD stage III ([place])   . Blood transfusion 1972   "after daughter born, attempted to give me blood; couldn't give it cause my blood was cold" (09/23/2013)  . CAD (coronary artery disease)    a. LHC 08/23/11: dLM 40-50%, oRI 40%, oCFX 40%, oD1 70% (small and not amenable to PCI).  LM lesion did not appear to be flow limiting.  Medical rx was recommended;  b. Echo 08/23/11: mild LVH, EF 60-65%, grade 1 diast dysfxn, mild BAE, PASP 24;  c. 02/2012  Cath: LM 50d, LAD 50p, D1 70ost, RI 70p, RCA ok->Med Rx;  d. 01/2013 Cardiolite: EF 88, no ischemia/infarct.  . Carotid stenosis    a. dopplers 10/12:  0-39% bilat ICA;  b. 10/2012 U/S: 0-39% bilat, f/u 1 yr (10/2013).  Marland Kitchen  Chronic bronchitis   . Diverticulosis of colon (without mention of hemorrhage)   . GERD (gastroesophageal reflux disease)   . Heart murmur, systolic    2-D echo in December 2011 showed a normal EF with grade 1 diastolic dysfunction, trivial pulmonary regurgitation and mildly elevated PA pressure at 37 mmHg probably secondary to her COPD.dynamic obstruction-mid cavity obliteration;  b. 02/2012 Echo: EF 60-65%, mild LVH, PASP 54mmHg.  Marland Kitchen History of migraine headaches    a. Next atypical symptoms in the past. Patient was started on Neurontin for possible neuropathic origin of her pain.  Marland Kitchen History of stomach ulcers 1970's  . Hyperlipidemia   . Hypertension   .  Hypothyroidism   . Migraines   . Polymyalgia rheumatica ([place])   . Shortness of breath on exertion    "just related to angina >1 yr ago" (09/23/2013)  . Sinus arrhythmia   . Type II diabetes mellitus ([place])    a. On oral hypoglycemic agents.    Family History: Father with COPD, Mother died in March 14, 2023 of unknown causes.  Social History: Denies tobacco, alcohol or drug use. Lives with daughter.   Review of Systems: A complete ROS was negative except as per HPI.   Physical Exam: Blood pressure (!) 145/59, pulse 88, temperature 98.6 F (37 C), temperature source Oral, resp. rate 18, height 5\' 4"  (1.626 m), weight 116 lb 9.6 oz (52.9 kg), last menstrual period 02/11/1975, SpO2 100 %. General: alert, well-developed, and cooperative to examination.  Head: normocephalic and atraumatic.  Eyes: vision grossly intact, pupils equal, pupils round, pupils reactive to light, no injection and anicteric.  Mouth: pharynx pink and moist, no erythema, and no exudates.  Neck: supple, full ROM, no thyromegaly, no JVD.  Lungs: normal respiratory effort, no accessory muscle use, normal breath sounds, no crackles, and no wheezes. Heart: normal rate, regular rhythm, no murmur, no gallop, and no rub.  Abdomen: soft, non-tender, normal bowel sounds, no distention, no guarding, no rebound tenderness.  Msk: no joint swelling, no joint warmth, and no redness over joints.  Pulses: 2+ DP/PT pulses bilaterally Extremities: No cyanosis, clubbing, edema Neurologic: alert & oriented X3, cranial nerves II-XII intact, strength normal in all extremities, resting and intention tremor, mild cogwheel rigidity, sensation intact to light touch.  Skin: turgor normal and no rashes.  Psych: Oriented X3, memory intact for recent and remote, normally interactive, good eye contact, not anxious appearing, and not depressed appearing  LABS: Na 121, K 3.9, Cl 87, CO2 22, BUN 8, Cr 0.99,  Glu 168 (repeat Na 129 after NS bolus) WBC 6.9, Hgb  13.0, Hct 37.1, Plts 313 TSH 2.136 iStat trop negative UA: colorless, low spec. Gravity, otherwise negative Serum osm decreased at 269 Urine osm decreased at 97 Urine sodium 19  EKG: Personally reviewed - SR, supraventricular bigeminy, no acute ST segment change  CT head: No acute changes, no fractures, lesions, hydrocephalus, mass or mass effect.  Assessment & Plan by Problem: Principal Problem:   Hyponatremia Active Problems:   Hypothyroidism   Type 2 diabetes mellitus, controlled (HCC)   Hyperlipidemia   Essential hypertension   GERD   Coronary artery disease   Dizziness   Hypertensive urgency  Hyponatremia: Patient with acute onset of mild confusion and tremors with an unknown amount of time with hypnatremia; last Bmet in Oct with normal electrolytes. Patient with stable CKD, euvolemic on exam, mental status improved from presentation per chart review. Differential diagnosis consists of hypovolemia, hypopituitarism, and high-fluid/low protein diet.  From chart review patient did not appear hypovolemic on arrival, but had constipation and then large BM after enema's (patient at baseline is orthostatic so unable to fully assess that). She does report decreased appetite and food intake with maintained water intake which would explain her hyponatremia. As patient was hypertensive on arrival, PRES also a possibility.  --holding valsartan, imipramine --stopped NS, f/u serial BMET to ensure pt does not overcorrect - goal of 6-8 per 24hrs and she has achieved that --neuro checks q4hrs  Hypertensive Urgency; HTN: Patient with hard to control HTN in setting of chronic orthostatic hypotension, dizziness and shortness of breath. On home regimen of Toprol XL 12.5mg  daily, valsartan 160mg  daily, and Imdur 30mg  daily. On arrival she was hypertensive to 240's SBP which was controlled with IV labetalol in ED.  --continue toprol XL 12.5 daily and imdur 30mg  daily; hold valsartan --was started on  amlodipine but can d/c if better control of BP, especially in setting of patient with chronic orthostatic hypotension --hydralazine 5mg  IV PRN  CAD: Patient with LHC on 11/16 showing stable coronary disease with 40-50% L main stenosis and 70-70% D1 stenosis. No active chest pain, shortness of breath different from baseline, EKG with possible PAC's without acute ischemic changes. --continue Plavix 75mg  daily as pt allergic to asa; atorvastatin 40mg  daily  T2DM: Patient controlled on oral agents at home with good control. Last A1c on 10/30/2016 was 6.6. --SSI-s for now; hold Amyril  HLD:  Patient continued on atorvastatin 40mg  daily  Hypothyroidism: Patient on levothyroxine 78mcg daily; TSH on admission wnl.  GERD: Patient continued on home regimen of protonix 40mg  daily; no current symptoms of GERD, nausea or vomiting.  COPD: PFT's on Oct 2016, FEV1/FVC 80%, DC 87% indicative of minimal airway obstruction. No wheezing on exam. --At home on Advair; formulary change to [facility]  DIET: Carb/mod IVF: none  DVT: lovenox Code: FULL  Dispo: Admit patient to Observation with expected length of stay less than 2 midnights.  Signed: Alphonzo Grieve, MD 11/27/2016, 9:32 AM  Pager [phone]

## 2016-11-27 NOTE — Progress Notes (Signed)
Subjective: Christine [name] is feeling better today, she reports resolution of her confusion and fatigue and is alert, oriented, and neurologically intact. Her only complaint is persistent dizziness that is chronic for her. She reports issues with constipation, poor appetite, and weight loss recently (-14 lbs / 10% BW in past 6 months). She denies abdominal pain, melena, hematochezia, prior hysterectomy, no fevers/sweats. Just prior to presentation she had been unable to have a BM for 4-5 days, which resolved with laxatives yesterday. She reports drinking 4-5 16oz glasses of water daily but has been not eating well.   Objective: Vital signs in last 24 hours: Vitals:   11/27/16 0130 11/27/16 0145 11/27/16 0236 11/27/16 0808  BP: (!) 93/46 138/56 (!) 148/50 (!) 145/59  Pulse: 77 88 88 88  Resp: 20 17 18 18   Temp:   98.1 F (36.7 C) 98.6 F (37 C)  TempSrc:   Oral Oral  SpO2: 100% 100% 100% 100%  Weight:   116 lb 9.6 oz (52.9 kg)   Height:   5\' 4"  (1.626 m)     Intake/Output Summary (Last 24 hours) at 11/27/16 0931 Last data filed at 11/27/16 [id number]  Gross per 24 hour  Intake             2800 ml  Output             1950 ml  Net              850 ml    Physical Exam General appearance: Elderly woman resting comfortably in bed, soft-spoken, conversational, and hard of hearing HENT: Normocephalic, atraumatic, pupils PERRL, moist mucous membranes, tongue tremor noted Cardiovascular: Regular rate and rhythm, holosystolic murmur best heard over left intercostal space, no rubs, gallops Respiratory/Chest: Clear to ausculation bilaterally, normal work of breathing Abdomen: Bowel sounds present, soft, non-tender, non-distended Skin: Warm, dry, intact Neuro: Cranial nerves grossly intact, alert and oriented, strength grossly intact, mild tremor of RUE and tongue noted Psych: Blunted affect  Labs / Imaging / Procedures: CBC Latest Ref Rng & Units 11/27/2016 11/26/2016 11/25/2016  WBC 4.0 - 10.5 K/uL  6.4 6.9 8.7  Hemoglobin 12.0 - 15.0 g/dL 12.1 13.0 13.1  Hematocrit 36.0 - 46.0 % 34.8(L) 37.1 38.3  Platelets 150 - 400 K/uL 297 313 324   BMP Latest Ref Rng & Units 11/27/2016 11/27/2016 11/26/2016  Glucose 65 - 99 mg/dL 118(H) 136(H) 168(H)  BUN 6 - 20 mg/dL 7 7 8   Creatinine 0.44 - 1.00 mg/dL 0.95 0.88 0.99  BUN/Creat Ratio 12 - 28 - - -  Sodium 135 - 145 mmol/L 131(L) 129(L) 121(L)  Potassium 3.5 - 5.1 mmol/L 3.7 3.7 3.9  Chloride 101 - 111 mmol/L 100(L) 98(L) 87(L)  CO2 22 - 32 mmol/L 24 20(L) 22  Calcium 8.9 - 10.3 mg/dL 9.3 9.6 9.7   Dg Chest 2 View  Result Date: 11/27/2016 CLINICAL DATA:  Confusion, weakness; patient's electronic health record is not available at time of interpretation EXAM: CHEST  2 VIEW COMPARISON:  09/2016 FINDINGS: Normal heart size, mediastinal contours, and pulmonary vascularity. Atherosclerotic calcification aorta. Eventration of the LEFT diaphragm again noted. No acute infiltrate, pleural effusion or pneumothorax. Osseous demineralization with suspected BILATERAL chronic rotator cuff tears. IMPRESSION: No acute abnormalities. Aortic atherosclerosis. Electronically Signed   By: Lavonia Dana M.D.   On: 11/27/2016 08:42   Ct Head Wo Contrast  Result Date: 11/26/2016 CLINICAL DATA:  Mental status change.  Hypertension. EXAM: CT HEAD WITHOUT CONTRAST TECHNIQUE: Contiguous  axial images were obtained from the base of the skull through the vertex without intravenous contrast. COMPARISON:  09/18/2014 FINDINGS: Brain: No evidence of acute infarction, hemorrhage, hydrocephalus, extra-axial collection or mass lesion/mass effect. Vascular: No hyperdense vessel or unexpected calcification. Skull: Normal. Negative for fracture or focal lesion. Sinuses/Orbits: Postoperative bilateral antrectomy. Paranasal sinuses and mastoid air cells are clear. Other: None. IMPRESSION: No acute intracranial abnormalities. Electronically Signed   By: Lucienne Capers M.D.   On: 11/26/2016 22:29     Assessment/Plan: Christine [name] is a 83 y.o. woman with PMH PMH of HTN, HLD, hypothyroidism, CAD, T2DM, COPD, CKD, GERD presenting with change in mental status.  Hyponatremia, acute with somolence/confusion, resolved by 1/10 after sodium corrected with IV fluids. Initially presented on 1/8 with constipation/abdominal pain in ED triage and labs revealed Na 128. On presentation 1/9 Na had dropped to 121, corrected to 129/131 overnight with 1L bolus and few hours of maintenance fluids. Reports copious water intake and minimal diet. Correction of acute hyponatremia is permissible, no risk of ODS/CPM. Previous sodium levels in October 2017 and previous were normal range 130s-140s. Serum Osm low at 269, urine osm low at 97, urine sodium 19 - with reported history best fits "tea and toast diet" with excess water intake etiology. - Resolved s/p IV fluids, fluid restrict for now - Trend BMP  - Nutrition consult  Hypertensive urgency, SBP 240s at home and per EMS, 229/199 on admission with confusion. Had missed 1-2 days of home antihypertensives. Received 10mg  IV Labetalol in ED, and started on Amlodipine 10mg  early this AM.  BP improved to 145/59 by this morning. Has history of labile HTN with chronic orthostatic symptoms, dizziness.  - Resume home Metoprolol XL 12.5 mg - Resume home Imdur 24hr 30 mg - Continue Amlodipine 10 mg daily - Hold home Valsartan 160mg  daily, restart if/when hypertension returns - Hydralazine 5mg  IV PRN Q2H for SBP >180  Constipation, present for several weeks, unclear etiology. Denies hematochezia, melena, no evidence of anemia/microcytosis on CBC. Last colonoscopy 2009 and normal. Unclear etiology. - Added Fibercon daily - Miralax daily - Senna QHS   Weight loss, unintentional, 14 lbs in approx 6 months (wt 131 June/july > 117 now, approx 10% body weight) with loss of appetite, constipation may be related. Denies fevers, night sweats, new pains. Concern for occult  malignancy colon vs ovarian. - recent CT chest with benign nodules, nonsmoker, history of hysterectomy in 1970s and no ovaries seen on transvaginal ultrasound in 2001. Colonoscopy last in 2009 with no polyps.  - Unclear etiology - optimize treatment of constipation for now - Nutirition consult  CAD, LHC on 11/16 showing stable coronary disease with 40-50% L main stenosis and 70-70% D1 stenosis. No active chest pain, shortness of breath different from baseline, EKG with possible PAC's without acute ischemic changes. --continue Plavix 75mg  daily as pt allergic to asa; atorvastatin 40mg  daily  T2DM, last A1c 6.6 in 10/2016, well-controlled on oral agents. --SSI-s for now; hold Amaryl  HLD - continued on atorvastatin 40mg  daily  Hypothyroidism - continue levothyroxine 69mcg daily; TSH on admission wnl.  GERD - continued on home regimen of Protonix 40mg  daily; no current symptoms of GERD, nausea or vomiting.  COPD - PFT's on Oct 2016, FEV1/FVC 80%, DC 87% indicative of minimal airway obstruction. No wheezing on exam. --At home on Advair; formulary change to [facility]  Diet: CM, fluid restrict to 1200 mL DVT: Lovenox Full code  Dispo: Anticipated discharge in approximately 1-2  day(s).   LOS: 0 days   Asencion Partridge, MD 11/27/2016, 9:31 AM Pager: [phone]

## 2016-11-27 NOTE — Progress Notes (Signed)
Initial Nutrition Assessment  DOCUMENTATION CODES:   Non-severe (moderate) malnutrition in context of chronic illness  INTERVENTION:  Provide Ensure Enlive po BID, each supplement provides 350 kcal and 20 grams of protein Multivitamin with minerals daily   NUTRITION DIAGNOSIS:   Malnutrition related to poor appetite, chronic illness as evidenced by energy intake < 75% for > or equal to 1 month, mild depletion of body fat, moderate depletions of muscle mass, percent weight loss.  GOAL:   Patient will meet greater than or equal to 90% of their needs   MONITOR:   PO intake, Supplement acceptance, Labs, Weight trends, I & O's, Skin  REASON FOR ASSESSMENT:   Consult Assessment of nutrition requirement/status  ASSESSMENT:   83 y.o. woman with PMH PMH of HTN, HLD, hypothyroidism, CAD, T2DM, COPD, CKD, GERD presenting with change in mental status.  Pt states that she has had a decreased appetite for several months, but did not realize how much weight she had lost, stating that she used to weigh 136 lbs about 1 year ago. Per weight history pt has lost ~11% of her body weight in the past 6-7 months. She has mild fat wasting and moderate muscle wasting per nutrition-focused physical exam. She states that she usually eats 3 meals per day. Usually a small bowl of oatmeal or cereal with banana for breakfast, a [place] sandwich for lunch, and a meat with potatoes or rice for dinner. When her appetite is poor, she may skip a meal.  RD discussed the importance of nutrition and recommended drinking Ensure Enlive 1-2 times daily. Discussed additional ways to increase calorie and protein intake. She reports eating very little at breakfast, but was hungry at time of visit and requested a [place] sandwich.   Labs: low sodium, low chloride  Diet Order:  Diet Carb Modified Fluid consistency: Thin; Room service appropriate? Yes; Fluid restriction: 1200 mL Fluid  Skin:  Reviewed, no issues  Last BM:   unknown  Height:   Ht Readings from Last 1 Encounters:  11/27/16 5\' 4"  (1.626 m)    Weight:   Wt Readings from Last 1 Encounters:  11/27/16 116 lb 9.6 oz (52.9 kg)    Ideal Body Weight:  54.55 kg  BMI:  Body mass index is 20.01 kg/m.  Estimated Nutritional Needs:   Kcal:  1300-1500  Protein:  65-75 grams  Fluid:  1.5 L/day  EDUCATION NEEDS:   No education needs identified at this time  [place], CSP, LDN Inpatient Clinical Dietitian Pager: [phone] After Hours Pager: [phone]

## 2016-11-27 NOTE — Care Management Note (Signed)
Case Management Note  Patient Details  Name: Christine [name] MRN: [id number] Date of Birth: 01/24/1934  Subjective/Objective:       Admitted with Hyponatremia             Action/Plan: Patient lives at home with her daughter; PCP is Dr Venia Minks; has private insurance with Medicare/ Medicaid with prescription drug coverage; pharmacy of choice is [facility]; pt reports no problem getting her medication; her daughter cooks her meals that are low in sodium. Patient could benefit for [facility] services for ongoing education, medication management; [place] choice offered; pt chose [facility]; Hoyle Sauer with [facility] called for arrangements; No DME is needed at this thme; Attending MD at discharge please enter the face to face for [facility] services per Medicare requirements.  Expected Discharge Date:  11/30/16               Expected Discharge Plan:  [place]  In-House Referral:   [facility]  Discharge planning Services  CM Consult   Choice offered to:  Patient  HH Arranged:  RN, PT [facility] Agency:  [facility]  Status of Service:   In progress  Royston Bake, RN 11/27/2016, 11:04 AM

## 2016-11-27 NOTE — Progress Notes (Signed)
Patient transferred to [place] from Emergency department. Patient oriented to room. Patient verbalized understanding. Vitals stable. Patient in bed asleep. Will continue to monitor.  Hermina Barters Rn 11/27/2016 3:31 AM

## 2016-11-28 ENCOUNTER — Telehealth: Payer: Self-pay

## 2016-11-28 DIAGNOSIS — E871 Hypo-osmolality and hyponatremia: Secondary | ICD-10-CM | POA: Diagnosis not present

## 2016-11-28 DIAGNOSIS — E119 Type 2 diabetes mellitus without complications: Secondary | ICD-10-CM

## 2016-11-28 DIAGNOSIS — R42 Dizziness and giddiness: Secondary | ICD-10-CM

## 2016-11-28 DIAGNOSIS — R4182 Altered mental status, unspecified: Secondary | ICD-10-CM | POA: Diagnosis not present

## 2016-11-28 DIAGNOSIS — K5909 Other constipation: Secondary | ICD-10-CM | POA: Diagnosis not present

## 2016-11-28 DIAGNOSIS — R630 Anorexia: Secondary | ICD-10-CM | POA: Diagnosis not present

## 2016-11-28 LAB — BASIC METABOLIC PANEL
ANION GAP: 9 (ref 5–15)
BUN: 10 mg/dL (ref 6–20)
CHLORIDE: 100 mmol/L — AB (ref 101–111)
CO2: 25 mmol/L (ref 22–32)
CREATININE: 0.86 mg/dL (ref 0.44–1.00)
Calcium: 9.7 mg/dL (ref 8.9–10.3)
GFR calc non Af Amer: >60 mL/min (ref >60)
Glucose, Bld: 127 mg/dL — ABNORMAL HIGH (ref 65–99)
Potassium: 4 mmol/L (ref 3.5–5.1)
SODIUM: 134 mmol/L — AB (ref 135–145)

## 2016-11-28 LAB — GLUCOSE, CAPILLARY
Glucose-Capillary: 171 mg/dL — ABNORMAL HIGH (ref 65–99)
Glucose-Capillary: 198 mg/dL — ABNORMAL HIGH (ref 65–99)

## 2016-11-28 MED ORDER — ADULT MULTIVITAMIN W/MINERALS CH: 1.0000 | ORAL_TABLET | Freq: Every day | ORAL | 2 refills | Status: DC

## 2016-11-28 MED ORDER — INSULIN ASPART 100 UNIT/ML ~~LOC~~ SOLN
0.0000 [IU] | Freq: Three times a day (TID) | SUBCUTANEOUS | Status: DC
Start: 2016-11-28 — End: 2016-11-28
  Administered 2016-11-28 (×2): 2 [IU] via SUBCUTANEOUS

## 2016-11-28 MED ORDER — ADULT MULTIVITAMIN W/MINERALS CH: 1.0000 | ORAL_TABLET | Freq: Every day | ORAL | 2 refills | Status: AC

## 2016-11-28 MED ORDER — CALCIUM POLYCARBOPHIL 625 MG PO TABS: 625.0000 mg | ORAL_TABLET | Freq: Every day | ORAL | 2 refills | Status: DC

## 2016-11-28 MED ORDER — IRBESARTAN 300 MG PO TABS
150.0000 mg | ORAL_TABLET | Freq: Every day | ORAL | Status: DC
  Administered 2016-11-28: 150 mg via ORAL
  Filled 2016-11-28: qty 1

## 2016-11-28 MED ORDER — POLYETHYLENE GLYCOL 3350 17 G PO PACK: 17.0000 g | PACK | Freq: Every day | ORAL | 3 refills | Status: DC | PRN

## 2016-11-28 MED ORDER — SENNA 8.6 MG PO TABS: 1.0000 | ORAL_TABLET | Freq: Every evening | ORAL | 1 refills | Status: DC | PRN

## 2016-11-28 MED ORDER — ENSURE ENLIVE PO LIQD: 237.0000 mL | Freq: Two times a day (BID) | ORAL | 12 refills | Status: DC

## 2016-11-28 MED ORDER — ONDANSETRON HCL 4 MG PO TABS: 4.0000 mg | ORAL_TABLET | Freq: Four times a day (QID) | ORAL | 0 refills | Status: DC | PRN

## 2016-11-28 MED ORDER — AMLODIPINE BESYLATE 10 MG PO TABS: 10.0000 mg | ORAL_TABLET | Freq: Every day | ORAL | 2 refills | Status: DC

## 2016-11-28 NOTE — Telephone Encounter (Signed)
Hospital TOC. 

## 2016-11-28 NOTE — Consult Note (Signed)
   [facility] Inpatient Consult   11/28/2016  [name] 01/24/1934 [id number]  Patient has been assessed for [place] [facility] services, this is the patient's first admission in 6 months. Patient is in [place].  Chart review reveals the patient is Christine [name] is a 83 y.o. woman with PMH PMH of HTN, HLD, hypothyroidism, CAD, T2DM, COPD, CKD, GERD presenting with change in mental status.  Patient noted to be home with family.  Inpatient RNCM has made a referral for [facility] EMMI COPD calls for post community follow up.  No other needs noted at this time. Patient currently under observation.  Please contact for questions:  Natividad Brood, RN BSN [facility] Liaison  [phone] business mobile phone Toll free office [phone]

## 2016-11-28 NOTE — Discharge Summary (Signed)
Name: Christine [name] MRN: [id number] DOB: 01/24/1934 83 y.o. PCP: [name], MD  Date of Admission: 11/26/2016  9:11 PM Date of Discharge: 11/28/2016 Attending Physician: [name], MD  Discharge Diagnosis: 1. Hyponatremia 2. Hypertensive urgency 3. Constipation 4. Moderate malnutrition  Principal Problem:   Hyponatremia Active Problems:   Hypothyroidism   Type 2 diabetes mellitus, controlled ([place])   Hyperlipidemia   Essential hypertension   GERD   Coronary artery disease   Dizziness   Hypertensive urgency   Malnutrition of moderate degree   Appetite loss   Constipation   Discharge Medications: Allergies as of 11/28/2016      Reactions   Aspirin Swelling, Other (See Comments)   Angioedema   Nsaids Other (See Comments)   "interacts with heart medications"   Codeine Nausea And Vomiting, Other (See Comments)   "makes me deathly sick" - can take with nausea medicine    Other Other (See Comments)   MSG   Penicillins Diarrhea   "real bad" Has patient had a PCN reaction causing immediate rash, facial/tongue/throat swelling, SOB or lightheadedness with hypotension: Yes Has patient had a PCN reaction causing severe rash involving mucus membranes or skin necrosis: No Has patient had a PCN reaction that required hospitalization No Has patient had a PCN reaction occurring within the last 10 years: Yes If all of the above answers are "NO", then may proceed with Cephalosporin use.      Medication List    STOP taking these medications   CHEWABLE MULTIPLE VITAMINS PO     TAKE these medications   acetaminophen 500 MG tablet Commonly known as:  TYLENOL Take 1,000 mg by mouth every 6 (six) hours as needed for headache. For pain   ADVAIR DISKUS 250-50 MCG/DOSE Aepb Generic drug:  Fluticasone-Salmeterol inhale 1 dose by mouth every 12 hours   amLODipine 10 MG tablet Commonly known as:  NORVASC Take 1 tablet (10 mg total) by mouth daily. Start taking on:   11/29/2016   atorvastatin 40 MG tablet Commonly known as:  LIPITOR Take 1 tablet (40 mg total) by mouth every evening.   clopidogrel 75 MG tablet Commonly known as:  PLAVIX take 1 tablet by mouth every evening   esomeprazole 40 MG capsule Commonly known as:  NEXIUM take 1 capsule by mouth once daily BEFORE BREAKFAST   feeding supplement (ENSURE ENLIVE) Liqd Take 237 mLs by mouth 2 (two) times daily between meals.   fluticasone 50 MCG/ACT nasal spray Commonly known as:  FLONASE Place 1 spray into both nostrils daily.   glimepiride 1 MG tablet Commonly known as:  AMARYL take 1 tablet by mouth once daily BEFORE BREAKFAST   guaiFENesin-dextromethorphan 100-10 MG/5ML syrup Commonly known as:  ROBITUSSIN DM Take 5 mLs by mouth every 4 (four) hours as needed for cough.   hydroxypropyl methylcellulose / hypromellose 2.5 % ophthalmic solution Commonly known as:  ISOPTO TEARS / GONIOVISC Place 1 drop into both eyes at bedtime.   imipramine 25 MG tablet Commonly known as:  TOFRANIL Take 1 tablet (25 mg total) by mouth 2 (two) times daily.   isosorbide mononitrate 30 MG 24 hr tablet Commonly known as:  IMDUR Take 30 mg by mouth daily.   levothyroxine 25 MCG tablet Commonly known as:  SYNTHROID, LEVOTHROID take 1 tablet by mouth once daily   loratadine 10 MG tablet Commonly known as:  CLARITIN Take 10 mg by mouth daily as needed for allergies, rhinitis or itching.   metoprolol  succinate 25 MG 24 hr tablet Commonly known as:  TOPROL XL Take 1 tablet (25 mg total) by mouth daily.   multivitamin with minerals Tabs tablet Take 1 tablet by mouth daily. Start taking on:  11/29/2016   NITROSTAT 0.4 MG SL tablet Generic drug:  nitroGLYCERIN place 1 tablet under the tongue if needed every 5 minutes for chest pain for 3 doses IF NO RELIEF AFTER 3RD DOSE CALL PRESCRIBER OR 911.   ondansetron 4 MG tablet Commonly known as:  ZOFRAN Take 1 tablet (4 mg total) by mouth every 6 (six)  hours as needed for nausea.   polycarbophil 625 MG tablet Commonly known as:  FIBERCON Take 1 tablet (625 mg total) by mouth daily.   polyethylene glycol packet Commonly known as:  MIRALAX / GLYCOLAX Take 17 g by mouth daily as needed.   senna 8.6 MG Tabs tablet Commonly known as:  SENOKOT Take 1 tablet (8.6 mg total) by mouth at bedtime as needed for mild constipation.   valsartan 160 MG tablet Commonly known as:  DIOVAN Take 1 tablet (160 mg total) by mouth daily.   VITAMIN C PO Take 1 tablet by mouth daily.       Disposition and follow-up:   Ms.[name] was discharged from [facility] in Stable condition.  At the hospital follow up visit please address:  1.  Hyponatremia - assess sodium level and fluid and nutrition intake, presented with confusion from acute hyponatremia most consistent with excess hydration and low protein diet  Constipation - assess for symptom resolution/worsening and need for Fibercon, Senna, Miralax  Weight loss - weight 113 lbs on discharge from 131 6 months ago, > 10% weight loss, deemed to have moderate malnutrition. Assess appetite, po intake, weight, adherence to Ensure supplements. Consider obtaining CT abdomen/pelvis do eval for occult malignancy. Also consider GI referral for evaluation of new chronic constipation and weight loss.   Hypertension - presented with SBP 230-240, persistent BP up to 190s/70s despite restarting home bp meds and adding Amlodipine 10mg  daily. Assess BP and titrate medications to achieve hypertensive control. Discharged on Amlodipine 10mg  (new), Valsartan 160mg , Metoprolol XL 12.5mg , Imdur 24hr 30mg  daily.   2.  Labs / imaging needed at time of follow-up: BMP, CT abdomen pelvis, consider GI referral   3.  Pending labs/ test needing follow-up: None  Follow-up Appointments: Follow-up Information    [facility] Follow up.   Specialty:  [place] Why:  They will do your home  health care at your home Contact information: [place]  [address] [phone]        [facility]. Go on 12/04/2016.   Why:  Hospital follow up visit at 9:15 AM, please arrive 15 minutes early to check in. Contact information: [address] [place] [facility] Course by problem list: Principal Problem:   Hyponatremia Active Problems:   Hypothyroidism   Type 2 diabetes mellitus, controlled ([place])   Hyperlipidemia   Essential hypertension   GERD   Coronary artery disease   Dizziness   Hypertensive urgency   Malnutrition of moderate degree   Appetite loss   Constipation   1. Hyponatremia  Ms. Chadick is an 83yo woman with PMH of HTN, HLD, hypothyroidism, CAD, DM2, COPD, CKD admitted on 11/27/16 with one day of confusion and change in mental status per family and was found  to have Na 121 and hypertension to systolic [id number]. She had been seen in the ED the day prior for severe constipation for 5 days, had Na 128 on triage labs, but left before evaluation due to prolonged wait time. She successfully had a large BM with laxative use at home prior to current admission. She received 1L NS bolus and several hours maintenance fluids started in the ED which corrected her acute hyponatremia to 129 within 4 hours and her mental status improved. CT head was normal. Her sodium was normal range three months ago (134). Serum osmolality was low at 269, urine osm low at 97, urine sodium 19 - with reported history of copious water intake and minimal diet for months - best fitting "tea and toast diet" etiology (i.e. high water low protein diet) for her hyponatremia. We fluid restricted her to under 1200 cc for two days and her sodium corrected to 132 then to 134 with no recurrence of confusion or development of new neurologic symptoms. Only finding being persistent tremor in her hands and tongue, but she states these have  been present for months. She was ambulating, eating and drinking well, and stable for discharge on 1/11.   Constipation - reported issues with constipation s/p BM after 4-5 days prior to admission that resolved with laxatives at him. Endorsed months of increased constipation of unclear etiology. Abdominal XR on 1/10 revealed no large stool burden. Started on Fiber supplement, Miralax, and Senna and had daily BMs while inpatient. Provided prescriptions for these on discharge.  Weight loss - Endorsed loss of appetite and poor po intake for months. Weight 116 lbs from 131 6 months ago. Evaluated by dietitian 1/10 and diagnosed as moderate malnutrition and began Ensure supplements BID and multivitamin. Concern for occult malignancy colon vs ovarian - recent CT chest with benign nodules, nonsmoker, history of hysterectomy in 1970s and no ovaries seen on transvaginal ultrasound in 2001. Colonoscopy last in 2009 with no polyps. No evidence of excess stool burden or mass on abdominal XR 11/27/16, some gastric bloating. Given her stability for discharge on 1/11, decision made to defer CT imaging of the abdomen and potential GI eval for potential repeat colonoscopy to outpatient setting.  Hypertension - presented with SBP 230-240 and had missed home antihypertensives for 1-2 days per report. Pressures improved to normotensive range with IV Labetolol 1x, started on Amlodipine 10 mg daily, and restarting most home meds. Started to climb to SBP 170s by second hospital day so all home antihypertensives restarted (added back Valsartan equivalent - Irbesartan 150mg ) but continued to climb to SBP 190s. Responded to dose of IV hydralazine to SBP 160s and stabilized by afternoon on 1/11. Pending discharge and advised to follow up early next week for hospital follow up to reassess antihypertensive regimen.  Discharge Vitals:   BP (!) 178/68 (BP Location: Right Arm)   Pulse 69   Temp 97.8 F (36.6 C) (Oral)   Resp 16   Ht  5\' 4"  (1.626 m)   Wt 113 lb 11.2 oz (51.6 kg)   LMP 02/11/1975   SpO2 100%   BMI 19.52 kg/m    Pertinent Labs, Studies, and Procedures:   CT head wo contrast - 11/26/2016 IMPRESSION:  No acute intracranial abnormalities.  DG Abd 2 views - 11/27/2016 IMPRESSION:  No definite acute intra-abdominal abnormality. The stool burden does not appear excessive. Given the patient's symptoms, abdominal and pelvic CT scanning would be a useful next imaging step.   Results for Brenton,  KASHLYN LANGILL (MRN [id number]) as of 11/29/2016 20:30  Ref. Range 11/25/2016 19:27 11/26/2016 21:58 11/26/2016 22:01 11/27/2016 01:56 11/27/2016 08:12 11/27/2016 13:31 11/28/2016 03:21  Sodium Latest Ref Range: 135 - 145 mmol/L 128 (L) 121 (L)  129 (L) 131 (L) 132 (L) 134 (L)  Potassium Latest Ref Range: 3.5 - 5.1 mmol/L 3.3 (L) 3.9  3.7 3.7 4.1 4.0  Chloride Latest Ref Range: 101 - 111 mmol/L 91 (L) 87 (L)  98 (L) 100 (L) 101 100 (L)  CO2 Latest Ref Range: 22 - 32 mmol/L 23 22  20  (L) 24 23 25   Glucose Latest Ref Range: 65 - 99 mg/dL 112 (H) 168 (H)  136 (H) 118 (H) 194 (H) 127 (H)  BUN Latest Ref Range: 6 - 20 mg/dL 9 8  7 7 9 10   Creatinine Latest Ref Range: 0.44 - 1.00 mg/dL 1.00 0.99  0.88 0.95 1.00 0.86  Calcium Latest Ref Range: 8.9 - 10.3 mg/dL 9.8 9.7  9.6 9.3 9.5 9.7  Anion gap Latest Ref Range: 5 - 15  14 12  11 7 8 9    Results for ALMEDA, HARTKOPF (MRN [id number]) as of 11/29/2016 20:30  Ref. Range 11/27/2016 01:56  Osmolality Latest Ref Range: 275 - 295 mOsm/kg 269 (L)   Results for ELEFTERIA, MUNGOVAN (MRN [id number]) as of 11/29/2016 20:30  Ref. Range 11/27/2016 01:56  Osmolality Latest Ref Range: 275 - 295 mOsm/kg 269 (L)   Results for MINNA, BEHMER (MRN [id number]) as of 11/29/2016 20:30  Ref. Range 11/27/2016 06:06  Osmolality, Urine Latest Ref Range: 300 - 900 mOsm/kg 97 (L)  Sodium, Urine Latest Units: mmol/L 19    Discharge Instructions: Discharge Instructions    Diet - low sodium heart healthy    Complete by:  As  directed    Discharge instructions    Complete by:  As directed    Please continue to take your medications as prescribed. It is important that [name] avoid drinking excess water at home, eat regular meals, and drink two Ensure shakes daily to improve your nutrition.   We have prescribed a new blood pressure medication, Amlodipine (Norvasc), that [name] should start taking in addition to your previous blood pressure medications.   We have prescribed Ensure feeding supplements and [name] should start taking adult multivitamins daily to help with your malnutrition and weight loss. Please find an adult daily multivitamin over the counter.  We have prescribed a Fiber supplement daily, and Miralax and Senna to take once daily as needed for constipation.  We have provided a short prescription for Zofran to take (up to every 6 hours) as needed for nausea  Please return January 17th for a hospital follow up visit in our clinic.  If [name] develop confusion, worsening abdominal pain, dizziness, passing out, weakness, difficulty speaking, or any other concerning symptoms please go to the ER for evaluation.  If [name] have any questions or concerns, please call the clinic at [phone] or if it is the weekend or after hours, [name] may call [phone] and ask for the internal medicine resident on call.   Increase activity slowly    Complete by:  As directed       Signed: Asencion Partridge, MD 11/28/2016, 5:00 PM   Pager: [phone]

## 2016-11-28 NOTE — Progress Notes (Signed)
Family voiced concern about depression.stated there are some family issues going on the lat few months.

## 2016-11-28 NOTE — Progress Notes (Signed)
Pt has orders to be discharged. Discharge instructions given by Charlann Boxer, RN and pt has no additional questions at this time. Medication regimen reviewed and pt educated. Pt verbalized understanding and has no additional questions. Telemetry box removed. IV removed and site in good condition. Pt stable and waiting for transportation.  Clarise Cruz RN

## 2016-11-28 NOTE — Discharge Instructions (Signed)
Hyponatremia Introduction Hyponatremia is when the amount of salt (sodium) in your blood is too low. When sodium levels are low, your cells absorb extra water and they swell. The swelling happens throughout the body, but it mostly affects the brain. What are the causes? This condition may be caused by:  Heart, kidney, or liver problems.  Thyroid problems.  Adrenal gland problems.  Metabolic conditions, such as syndrome of inappropriate antidiuretic hormone (SIADH).  Severe vomiting and diarrhea.  Certain medicines or illegal drugs.  Dehydration.  Drinking too much water.  Eating a diet that is low in sodium.  Large burns on your body.  Sweating.  Constipation, Adult Constipation is when a person: Poops (has a bowel movement) fewer times in a week than normal. Has a hard time pooping. Has poop that is dry, hard, or bigger than normal. Follow these instructions at home: Eating and drinking Eat foods that have a lot of fiber, such as: Fresh fruits and vegetables. Whole grains. Beans. Eat less of foods that are high in fat, low in fiber, or overly processed, such as: [place] fries. Hamburgers. Cookies. Candy. Soda. Drink enough fluid to keep your pee (urine) clear or pale yellow. General instructions Exercise regularly or as told by your doctor. Go to the restroom when you feel like you need to poop. Do not hold it in. Take over-the-counter and prescription medicines only as told by your doctor. These include any fiber supplements. Do pelvic floor retraining exercises, such as: Doing deep breathing while relaxing your lower belly (abdomen). Relaxing your pelvic floor while pooping. Watch your condition for any changes. Keep all follow-up visits as told by your doctor. This is important. Contact a doctor if: You have pain that gets worse. You have a fever. You have not pooped for 4 days. You throw up (vomit). You are not hungry. You lose weight. You are  bleeding from the anus. You have thin, pencil-like poop (stool). Get help right away if: You have a fever, and your symptoms suddenly get worse. You leak poop or have blood in your poop. Your belly feels hard or bigger than normal (is bloated). You have very bad belly pain. You feel dizzy or you faint. This information is not intended to replace advice given to you by your health care provider. Make sure you discuss any questions you have with your health care provider. Document Released: 04/22/2008 Document Revised: 05/24/2016 Document Reviewed: 04/24/2016 Elsevier Interactive Patient Education  2017 [employer].   What increases the risk? This condition is more likely to develop in people who:  Have long-term (chronic) kidney disease.  Have heart failure.  Have a medical condition that causes frequent or excessive diarrhea.  Have metabolic conditions, such as Addison disease or SIADH.  Take certain medicines that affect the sodium and fluid balance in the blood. Some of these medicine types include:  Diuretics.  NSAIDs.  Some opioid pain medicines.  Some antidepressants.  Some seizure prevention medicines. What are the signs or symptoms? Symptoms of this condition include:  Nausea and vomiting.  Confusion.  Lethargy.  Agitation.  Headache.  Seizures.  Unconsciousness.  Appetite loss.  Muscle weakness and cramping.  Feeling weak or light-headed.  Having a rapid heart rate.  Fainting, in severe cases. How is this diagnosed? This condition is diagnosed with a medical history and physical exam. You will also have other tests, including:  Blood tests.  Urine tests. How is this treated? Treatment for this condition depends on the cause.  Treatment may include:  Fluids given through an IV tube that is inserted into one of your veins.  Medicines to correct the sodium imbalance. If medicines are causing the condition, the medicines will need to be  adjusted.  Limiting water or fluid intake to get the correct sodium balance. Follow these instructions at home:  Take medicines only as directed by your health care provider. Many medicines can make this condition worse. Talk with your health care provider about any medicines that you are currently taking.  Carefully follow a recommended diet as directed by your health care provider.  Carefully follow instructions from your health care provider about fluid restrictions.  Keep all follow-up visits as directed by your health care provider. This is important.  Do not drink alcohol. Contact a health care provider if:  You develop worsening nausea, fatigue, headache, confusion, or weakness.  Your symptoms go away and then return.  You have problems following the recommended diet. Get help right away if:  You have a seizure.  You faint.  You have ongoing diarrhea or vomiting. This information is not intended to replace advice given to you by your health care provider. Make sure you discuss any questions you have with your health care provider. Document Released: 10/25/2002 Document Revised: 04/11/2016 Document Reviewed: 11/24/2014  2017 Elsevier

## 2016-11-28 NOTE — Evaluation (Signed)
Physical Therapy Evaluation Patient Details Name: Christine [name] MRN: [id number] DOB: 01/24/1934 Today's Date: 11/28/2016   History of Present Illness  Pt is an 83 y/o female admitted secondary to confusion and unexplainable weight loss. PMH including but not limited to CHF, CKD, CAD, HTN and DM.   Clinical Impression  Pt presented sitting EOB when PT entered room. Prior to admission, pt reported that she is independent with all functional mobility and ADLs. Pt currently performing all functional mobility with supervision; no instability or LOB noted during transfers or with ambulation. No further acute PT needs identified at this time. PT signing off.     Follow Up Recommendations No PT follow up    Equipment Recommendations  None recommended by PT    Recommendations for Other Services       Precautions / Restrictions Precautions Precautions: None Restrictions Weight Bearing Restrictions: No      Mobility  Bed Mobility Overal bed mobility: Modified Independent                Transfers Overall transfer level: Needs assistance Equipment used: None Transfers: Sit to/from Stand Sit to Stand: Supervision         General transfer comment: increased time, supervision for safety  Ambulation/Gait Ambulation/Gait assistance: Supervision Ambulation Distance (Feet): 200 Feet Assistive device: None Gait Pattern/deviations: Step-through pattern;Decreased stride length Gait velocity: WFL Gait velocity interpretation: at or above normal speed for age/gender General Gait Details: pt demonstrated no instability or LOB, supervision for safety  Stairs            Wheelchair Mobility    Modified Rankin (Stroke Patients Only)       Balance Overall balance assessment: Needs assistance Sitting-balance support: Feet supported;No upper extremity supported Sitting balance-Leahy Scale: Good     Standing balance support: During functional activity;No upper extremity  supported Standing balance-Leahy Scale: Good               High level balance activites: Direction changes;Turns;Head turns High Level Balance Comments: no LOB or instability             Pertinent Vitals/Pain Pain Assessment: No/denies pain    Home Living Family/patient expects to be discharged to:: Private residence Living Arrangements: Children Available Help at Discharge: Family;Available PRN/intermittently Type of Home: House Home Access: Level entry     Home Layout: One level Home Equipment: None      Prior Function Level of Independence: Independent               Hand Dominance        Extremity/Trunk Assessment   Upper Extremity Assessment Upper Extremity Assessment: Overall WFL for tasks assessed    Lower Extremity Assessment Lower Extremity Assessment: Overall WFL for tasks assessed    Cervical / Trunk Assessment Cervical / Trunk Assessment: Normal  Communication   Communication: No difficulties  Cognition Arousal/Alertness: Awake/alert Behavior During Therapy: WFL for tasks assessed/performed Overall Cognitive Status: Within Functional Limits for tasks assessed                      General Comments      Exercises     Assessment/Plan    PT Assessment Patent does not need any further PT services  PT Problem List            PT Treatment Interventions      PT Goals (Current goals can be found in the Care Plan section)  Acute Rehab PT Goals Patient  Stated Goal: return home    Frequency     Barriers to discharge        Co-evaluation               End of Session   Activity Tolerance: Patient tolerated treatment well Patient left: in bed;with call bell/phone within reach (sitting EOB) Nurse Communication: Mobility status    Functional Assessment Tool Used: clinical observation Functional Limitation: Mobility: Walking and moving around Mobility: Walking and Moving Around Current Status [id number]): 0  percent impaired, limited or restricted Mobility: Walking and Moving Around Goal Status [phone]): 0 percent impaired, limited or restricted Mobility: Walking and Moving Around Discharge Status [phone]): 0 percent impaired, limited or restricted    Time: 0917-0926 PT Time Calculation (min) (ACUTE ONLY): 9 min   Charges:   PT Evaluation $PT Eval Low Complexity: 1 Procedure     PT G Codes:   PT G-Codes **NOT FOR INPATIENT CLASS** Functional Assessment Tool Used: clinical observation Functional Limitation: Mobility: Walking and moving around Mobility: Walking and Moving Around Current Status [id number]): 0 percent impaired, limited or restricted Mobility: Walking and Moving Around Goal Status [id number]): 0 percent impaired, limited or restricted Mobility: Walking and Moving Around Discharge Status [phone]): 0 percent impaired, limited or restricted    [employer] 11/28/2016, 11:00 AM Sherie Don, PT, DPT [phone]

## 2016-11-28 NOTE — Progress Notes (Signed)
  Date: 11/28/2016  Patient name: Christine [name]  Medical record number: [id number]  Date of birth: 01/24/1934   I have seen and evaluated this patient and I have discussed the plan of care with the house staff. Please see Dr. Durenda Age note for complete details. I concur with his findings.   Plan for discharge today.  Follow up in clinic in 1-2 weeks.   [name], MD 11/28/2016, 12:54 PM

## 2016-11-28 NOTE — Progress Notes (Signed)
Subjective: Christine [name] is feeling well this morning. No issues with confusion or weakness, no constipation and with LBM yesterday. Eating well and tolerating Ensure nutritional supplements.   Some hypertension this AM so restarted home ARB.   Objective: Vital signs in last 24 hours: Vitals:   11/28/16 0101 11/28/16 0140 11/28/16 0457 11/28/16 0809  BP: (!) 198/78 (!) 174/65 (!) 179/81   Pulse:  87 88 88  Resp:   18 18  Temp:   97.9 F (36.6 C)   TempSrc:   Oral   SpO2:   100% 100%  Weight:   113 lb 11.2 oz (51.6 kg)   Height:        Intake/Output Summary (Last 24 hours) at 11/28/16 0843 Last data filed at 11/28/16 [id number]  Gross per 24 hour  Intake              960 ml  Output             2600 ml  Net            -1640 ml    Physical Exam General appearance: Elderly woman resting comfortably in bed, soft-spoken, conversational, and hard of hearing HENT: Normocephalic, atraumatic, pupils PERRL, moist mucous membranes, tongue tremor noted Cardiovascular: Regular rate and rhythm, holosystolic murmur, no rubs, gallops Respiratory/Chest: Clear to ausculation bilaterally, normal work of breathing Abdomen: Bowel sounds present, soft, non-tender, non-distended Skin: Warm, dry, intact Neuro: Cranial nerves grossly intact, alert and oriented, strength grossly intact, mild tremor of BUEs and tongue noted, normal muscle tone Psych: Blunted affect  Labs / Imaging / Procedures: CBC Latest Ref Rng & Units 11/27/2016 11/26/2016 11/25/2016  WBC 4.0 - 10.5 K/uL 6.4 6.9 8.7  Hemoglobin 12.0 - 15.0 g/dL 12.1 13.0 13.1  Hematocrit 36.0 - 46.0 % 34.8(L) 37.1 38.3  Platelets 150 - 400 K/uL 297 313 324   BMP Latest Ref Rng & Units 11/28/2016 11/27/2016 11/27/2016  Glucose 65 - 99 mg/dL 127(H) 194(H) 118(H)  BUN 6 - 20 mg/dL 10 9 7   Creatinine 0.44 - 1.00 mg/dL 0.86 1.00 0.95  BUN/Creat Ratio 12 - 28 - - -  Sodium 135 - 145 mmol/L 134(L) 132(L) 131(L)  Potassium 3.5 - 5.1 mmol/L 4.0 4.1 3.7    Chloride 101 - 111 mmol/L 100(L) 101 100(L)  CO2 22 - 32 mmol/L 25 23 24   Calcium 8.9 - 10.3 mg/dL 9.7 9.5 9.3   Dg Abd 2 Views  Result Date: 11/27/2016 CLINICAL DATA:  Recent onset of constipation, weight loss and decreased appetite. No abdominal pain, nausea, vomiting, or diarrhea. EXAM: ABDOMEN - 2 VIEW COMPARISON:  Limited views of the abdomen from a lumbar spine series of May 25, 2010 FINDINGS: There is moderate gaseous distention of the stomach. The colonic stool burden does not appear excessive. There is gas within both small and large bowel loops without evidence of an obstructive pattern. There is gas in the rectum. There are no abnormal soft tissue calcifications. There are degenerative changes of the lower lumbar spine. The lung bases are clear. IMPRESSION: No definite acute intra-abdominal abnormality. The stool burden does not appear excessive. Given the patient's symptoms, abdominal and pelvic CT scanning would be a useful next imaging step. Electronically Signed   By: David  Martinique M.D.   On: 11/27/2016 15:29    Assessment/Plan: Christine [name] is a 83 y.o. woman with PMH PMH of HTN, HLD, hypothyroidism, CAD, T2DM, COPD, CKD, GERD presenting with change in mental status.  Hyponatremia, acute with somolence/confusion, resolved by 1/10 after sodium corrected with IV fluids. Initially presented on 1/8 with constipation/abdominal pain in ED triage and labs revealed Na 128. On presentation 1/9 Na had dropped to 121, corrected to 129/131 overnight with 1L bolus and few hours of maintenance fluids. Reports copious water intake and minimal diet. Correction of acute hyponatremia is permissible, no risk of ODS/CPM. Previous sodium levels in October 2017 and previous were normal range 130s-140s. Serum Osm low at 269, urine osm low at 97, urine sodium 19 - with reported history best fits "tea and toast diet" with excess water intake etiology. - Resolved s/p IV fluids and 1 day of fluid  restriction - Na 134 today, recheck BMP at hospital follow up - Nutrition consulted, appreciate recs  Hypertensive urgency, SBP 240s at home and per EMS, 229/199 on admission with confusion. Had missed 1-2 days of home antihypertensives. Received 10mg  IV Labetalol in ED, and started on Amlodipine 10mg  early this AM.  BP improved to 145/59 by this morning. Has history of labile HTN with chronic orthostatic symptoms, dizziness.  - Resume home Metoprolol XL 12.5 mg - Resume home Imdur 24hr 30 mg - Continue Amlodipine 10 mg daily - Resumed home Valsartan equivalent, Irbesartan 150mg  this AM - Hydralazine 5mg  IV PRN Q2H for SBP >180  Constipation, present for several weeks, unclear etiology. Denies hematochezia, melena, no evidence of anemia/microcytosis on CBC. Last colonoscopy 2009 and normal. Unclear etiology. No evidence of excess stool burden on abdominal XR 11/27/16.  - Fibercon daily - Miralax daily - Senna QHS   Weight loss, unintentional, 14 lbs in approx 6 months (wt 131 June/july > 117 now, approx 10% body weight) with loss of appetite, constipation may be related. Denies fevers, night sweats, new pains. Concern for occult malignancy colon vs ovarian. - recent CT chest with benign nodules, nonsmoker, history of hysterectomy in 1970s and no ovaries seen on transvaginal ultrasound in 2001. Colonoscopy last in 2009 with no polyps. No evidence of excess stool burden on abdominal XR 11/27/16, some gastric bloating. - Unclear etiology - optimize treatment of constipation for now - Likely requires CT A/P with contrast to further evaluate her symptoms, discuss ordering this outpatient - Nutirition consult, Ensure BID and multivitamin  CAD, LHC on 11/16 showing stable coronary disease with 40-50% L main stenosis and 70-70% D1 stenosis. No active chest pain, shortness of breath different from baseline, EKG with possible PAC's without acute ischemic changes. --continue Plavix 75mg  daily as pt allergic  to asa; atorvastatin 40mg  daily  T2DM, last A1c 6.6 in 10/2016, well-controlled on oral agents. --SSI-s for now; hold Amaryl  HLD - continued on atorvastatin 40mg  daily  Hypothyroidism - continue levothyroxine 17mcg daily; TSH on admission wnl.  GERD - continued on home regimen of Protonix 40mg  daily; no current symptoms of GERD, nausea or vomiting.  COPD - PFT's on Oct 2016, FEV1/FVC 80%, DC 87% indicative of minimal airway obstruction. No wheezing on exam. --At home on Advair; formulary change to [facility]  Diet: CM DVT: Lovenox Full code  Dispo: Anticipated discharge today.   LOS: 0 days   Asencion Partridge, MD 11/28/2016, 8:43 AM Pager: [phone]

## 2016-11-29 ENCOUNTER — Inpatient Hospital Stay (HOSPITAL_COMMUNITY)
Admission: EM | Admit: 2016-11-29 | Discharge: 2016-12-02 | DRG: 640 | Disposition: A | Discharge status: Home Health | Payer: Medicare Other | Attending: Student in an Organized Health Care Education/Training Program | Admitting: Student in an Organized Health Care Education/Training Program

## 2016-11-29 ENCOUNTER — Encounter (HOSPITAL_COMMUNITY): Payer: Self-pay | Admitting: General Practice

## 2016-11-29 ENCOUNTER — Telehealth: Payer: Self-pay

## 2016-11-29 DIAGNOSIS — E1122 Type 2 diabetes mellitus with diabetic chronic kidney disease: Secondary | ICD-10-CM | POA: Diagnosis not present

## 2016-11-29 DIAGNOSIS — R634 Abnormal weight loss: Secondary | ICD-10-CM

## 2016-11-29 DIAGNOSIS — Z7951 Long term (current) use of inhaled steroids: Secondary | ICD-10-CM

## 2016-11-29 DIAGNOSIS — J449 Chronic obstructive pulmonary disease, unspecified: Secondary | ICD-10-CM | POA: Diagnosis not present

## 2016-11-29 DIAGNOSIS — F332 Major depressive disorder, recurrent severe without psychotic features: Secondary | ICD-10-CM | POA: Diagnosis not present

## 2016-11-29 DIAGNOSIS — K5900 Constipation, unspecified: Secondary | ICD-10-CM | POA: Diagnosis present

## 2016-11-29 DIAGNOSIS — Z681 Body mass index (BMI) 19 or less, adult: Secondary | ICD-10-CM | POA: Diagnosis not present

## 2016-11-29 DIAGNOSIS — K219 Gastro-esophageal reflux disease without esophagitis: Secondary | ICD-10-CM | POA: Diagnosis present

## 2016-11-29 DIAGNOSIS — I10 Essential (primary) hypertension: Secondary | ICD-10-CM

## 2016-11-29 DIAGNOSIS — E039 Hypothyroidism, unspecified: Secondary | ICD-10-CM | POA: Diagnosis present

## 2016-11-29 DIAGNOSIS — I160 Hypertensive urgency: Secondary | ICD-10-CM | POA: Diagnosis present

## 2016-11-29 DIAGNOSIS — R471 Dysarthria and anarthria: Secondary | ICD-10-CM | POA: Diagnosis present

## 2016-11-29 DIAGNOSIS — I129 Hypertensive chronic kidney disease with stage 1 through stage 4 chronic kidney disease, or unspecified chronic kidney disease: Secondary | ICD-10-CM | POA: Diagnosis present

## 2016-11-29 DIAGNOSIS — I2510 Atherosclerotic heart disease of native coronary artery without angina pectoris: Secondary | ICD-10-CM

## 2016-11-29 DIAGNOSIS — F3342 Major depressive disorder, recurrent, in full remission: Secondary | ICD-10-CM | POA: Diagnosis present

## 2016-11-29 DIAGNOSIS — E43 Unspecified severe protein-calorie malnutrition: Secondary | ICD-10-CM | POA: Diagnosis not present

## 2016-11-29 DIAGNOSIS — Z7902 Long term (current) use of antithrombotics/antiplatelets: Secondary | ICD-10-CM

## 2016-11-29 DIAGNOSIS — Z79899 Other long term (current) drug therapy: Secondary | ICD-10-CM

## 2016-11-29 DIAGNOSIS — Z7984 Long term (current) use of oral hypoglycemic drugs: Secondary | ICD-10-CM

## 2016-11-29 DIAGNOSIS — E871 Hypo-osmolality and hyponatremia: Secondary | ICD-10-CM | POA: Diagnosis not present

## 2016-11-29 DIAGNOSIS — N183 Chronic kidney disease, stage 3 (moderate): Secondary | ICD-10-CM | POA: Diagnosis present

## 2016-11-29 DIAGNOSIS — E785 Hyperlipidemia, unspecified: Secondary | ICD-10-CM | POA: Diagnosis present

## 2016-11-29 DIAGNOSIS — F419 Anxiety disorder, unspecified: Secondary | ICD-10-CM | POA: Diagnosis present

## 2016-11-29 HISTORY — DX: Depression, unspecified: F32.A

## 2016-11-29 HISTORY — DX: Major depressive disorder, single episode, unspecified: F32.9

## 2016-11-29 LAB — BASIC METABOLIC PANEL
Anion gap: 11 (ref 5–15)
Anion gap: 12 (ref 5–15)
BUN: 12 mg/dL (ref 6–20)
BUN: 9 mg/dL (ref 6–20)
CALCIUM: 9.7 mg/dL (ref 8.9–10.3)
CHLORIDE: 94 mmol/L — AB (ref 101–111)
CO2: 21 mmol/L — ABNORMAL LOW (ref 22–32)
CO2: 23 mmol/L (ref 22–32)
Calcium: 9.6 mg/dL (ref 8.9–10.3)
Chloride: 98 mmol/L — ABNORMAL LOW (ref 101–111)
Creatinine, Ser: 0.79 mg/dL (ref 0.44–1.00)
Creatinine, Ser: 0.85 mg/dL (ref 0.44–1.00)
GLUCOSE: 166 mg/dL — AB (ref 65–99)
Glucose, Bld: 185 mg/dL — ABNORMAL HIGH (ref 65–99)
POTASSIUM: 4.5 mmol/L (ref 3.5–5.1)
POTASSIUM: 4.2 mmol/L (ref 3.5–5.1)
SODIUM: 126 mmol/L — AB (ref 135–145)
Sodium: 133 mmol/L — ABNORMAL LOW (ref 135–145)

## 2016-11-29 LAB — CBC WITH DIFFERENTIAL/PLATELET
BASOS ABS: 0 10*3/uL (ref 0.0–0.1)
Basophils Relative: 0 %
EOS ABS: 0.1 10*3/uL (ref 0.0–0.7)
EOS PCT: 2 %
HCT: 35.1 % — ABNORMAL LOW (ref 36.0–46.0)
Hemoglobin: 12.1 g/dL (ref 12.0–15.0)
LYMPHS ABS: 1.7 10*3/uL (ref 0.7–4.0)
LYMPHS PCT: 22 %
MCH: 30 pg (ref 26.0–34.0)
MCHC: 34.5 g/dL (ref 30.0–36.0)
MCV: 87.1 fL (ref 78.0–100.0)
Monocytes Absolute: 0.8 10*3/uL (ref 0.1–1.0)
Monocytes Relative: 10 %
NEUTROS PCT: 66 %
Neutro Abs: 5.1 10*3/uL (ref 1.7–7.7)
PLATELETS: 333 10*3/uL (ref 150–400)
RBC: 4.03 MIL/uL (ref 3.87–5.11)
RDW: 14.3 % (ref 11.5–15.5)
WBC: 7.7 10*3/uL (ref 4.0–10.5)

## 2016-11-29 LAB — GLUCOSE, CAPILLARY: GLUCOSE-CAPILLARY: 143 mg/dL — AB (ref 65–99)

## 2016-11-29 LAB — OSMOLALITY: Osmolality: 277 mOsm/kg (ref 275–295)

## 2016-11-29 MED ORDER — SODIUM CHLORIDE 0.9 % IV BOLUS (SEPSIS)
500.0000 mL | Freq: Once | INTRAVENOUS | Status: AC
  Administered 2016-11-29: 500 mL via INTRAVENOUS

## 2016-11-29 MED ORDER — FLUTICASONE PROPIONATE 50 MCG/ACT NA SUSP
1.0000 | Freq: Every day | NASAL | Status: DC | PRN
  Filled 2016-11-29: qty 16

## 2016-11-29 MED ORDER — ADULT MULTIVITAMIN W/MINERALS CH
1.0000 | ORAL_TABLET | Freq: Every day | ORAL | Status: DC
  Administered 2016-11-30 – 2016-12-02 (×3): 1 via ORAL
  Filled 2016-11-29 (×3): qty 1

## 2016-11-29 MED ORDER — POLYETHYLENE GLYCOL 3350 17 G PO PACK: 17.0000 g | PACK | Freq: Every day | ORAL | Status: DC | PRN

## 2016-11-29 MED ORDER — SENNA 8.6 MG PO TABS: 1.0000 | ORAL_TABLET | Freq: Every evening | ORAL | Status: DC | PRN

## 2016-11-29 MED ORDER — HYDRALAZINE HCL 20 MG/ML IJ SOLN
5.0000 mg | INTRAMUSCULAR | Status: DC | PRN
Start: 2016-11-29 — End: 2016-11-30
  Administered 2016-11-29: 5 mg via INTRAVENOUS
  Filled 2016-11-29: qty 1

## 2016-11-29 MED ORDER — IOPAMIDOL (ISOVUE-300) INJECTION 61%
INTRAVENOUS | Status: AC
  Filled 2016-11-29: qty 100

## 2016-11-29 MED ORDER — SODIUM CHLORIDE 0.9% FLUSH
3.0000 mL | Freq: Two times a day (BID) | INTRAVENOUS | Status: DC
  Administered 2016-11-29 – 2016-12-02 (×6): 3 mL via INTRAVENOUS

## 2016-11-29 MED ORDER — ATORVASTATIN CALCIUM 40 MG PO TABS
40.0000 mg | ORAL_TABLET | Freq: Every day | ORAL | Status: DC
  Administered 2016-11-29 – 2016-12-01 (×3): 40 mg via ORAL
  Filled 2016-11-29 (×3): qty 1

## 2016-11-29 MED ORDER — ENSURE ENLIVE PO LIQD: 237.0000 mL | Freq: Two times a day (BID) | ORAL | Status: DC

## 2016-11-29 MED ORDER — POLYVINYL ALCOHOL 1.4 % OP SOLN
1.0000 [drp] | Freq: Every day | OPHTHALMIC | Status: DC
  Administered 2016-11-29 – 2016-12-01 (×3): 1 [drp] via OPHTHALMIC
  Filled 2016-11-29 (×2): qty 15

## 2016-11-29 MED ORDER — RAMELTEON 8 MG PO TABS
8.0000 mg | ORAL_TABLET | Freq: Every day | ORAL | Status: DC
  Administered 2016-11-29 – 2016-12-01 (×3): 8 mg via ORAL
  Filled 2016-11-29 (×4): qty 1

## 2016-11-29 MED ORDER — AMLODIPINE BESYLATE 5 MG PO TABS
10.0000 mg | ORAL_TABLET | Freq: Every day | ORAL | Status: DC
  Administered 2016-11-30: 5 mg via ORAL
  Administered 2016-12-01 – 2016-12-02 (×2): 10 mg via ORAL
  Filled 2016-11-29 (×3): qty 2

## 2016-11-29 MED ORDER — CLOPIDOGREL BISULFATE 75 MG PO TABS
75.0000 mg | ORAL_TABLET | Freq: Every evening | ORAL | Status: DC
  Administered 2016-11-29 – 2016-12-02 (×4): 75 mg via ORAL
  Filled 2016-11-29 (×4): qty 1

## 2016-11-29 MED ORDER — LORATADINE 10 MG PO TABS
10.0000 mg | ORAL_TABLET | Freq: Every day | ORAL | Status: DC | PRN
Start: 2016-11-29 — End: 2016-12-02

## 2016-11-29 MED ORDER — INSULIN ASPART 100 UNIT/ML ~~LOC~~ SOLN
0.0000 [IU] | Freq: Every day | SUBCUTANEOUS | Status: DC
  Administered 2016-11-30: 2 [IU] via SUBCUTANEOUS

## 2016-11-29 MED ORDER — MOMETASONE FURO-FORMOTEROL FUM 200-5 MCG/ACT IN AERO
2.0000 | INHALATION_SPRAY | Freq: Two times a day (BID) | RESPIRATORY_TRACT | Status: DC
  Administered 2016-11-30 – 2016-12-02 (×4): 2 via RESPIRATORY_TRACT
  Filled 2016-11-29: qty 8.8

## 2016-11-29 MED ORDER — METOPROLOL SUCCINATE ER 25 MG PO TB24
25.0000 mg | ORAL_TABLET | Freq: Every day | ORAL | Status: DC
  Administered 2016-11-30: 25 mg via ORAL
  Filled 2016-11-29 (×2): qty 1

## 2016-11-29 MED ORDER — ENOXAPARIN SODIUM 40 MG/0.4ML ~~LOC~~ SOLN
40.0000 mg | SUBCUTANEOUS | Status: DC
  Administered 2016-11-30 – 2016-12-02 (×3): 40 mg via SUBCUTANEOUS
  Filled 2016-11-29 (×3): qty 0.4

## 2016-11-29 MED ORDER — ISOSORBIDE MONONITRATE ER 30 MG PO TB24
30.0000 mg | ORAL_TABLET | Freq: Every day | ORAL | Status: DC
  Administered 2016-11-30 – 2016-12-02 (×3): 30 mg via ORAL
  Filled 2016-11-29 (×3): qty 1

## 2016-11-29 MED ORDER — PANTOPRAZOLE SODIUM 40 MG PO TBEC
40.0000 mg | DELAYED_RELEASE_TABLET | Freq: Every day | ORAL | Status: DC
  Administered 2016-11-30 – 2016-12-02 (×3): 40 mg via ORAL
  Filled 2016-11-29 (×3): qty 1

## 2016-11-29 MED ORDER — INSULIN ASPART 100 UNIT/ML ~~LOC~~ SOLN
0.0000 [IU] | Freq: Three times a day (TID) | SUBCUTANEOUS | Status: DC
  Administered 2016-11-30: 1 [IU] via SUBCUTANEOUS
  Administered 2016-11-30: 2 [IU] via SUBCUTANEOUS
  Administered 2016-11-30: 5 [IU] via SUBCUTANEOUS
  Administered 2016-12-01: 1 [IU] via SUBCUTANEOUS

## 2016-11-29 MED ORDER — LEVOTHYROXINE SODIUM 25 MCG PO TABS
25.0000 ug | ORAL_TABLET | Freq: Every day | ORAL | Status: DC
  Administered 2016-11-30 – 2016-12-02 (×3): 25 ug via ORAL
  Filled 2016-11-29 (×3): qty 1

## 2016-11-29 NOTE — ED Provider Notes (Signed)
[facility] DEPT Provider Note   CSN: [id number] Arrival date & time: 11/29/16  1305     History   Chief Complaint Chief Complaint  Patient presents with  . Altered Mental Status    HPI Christine [name] is a 83 y.o. female.  HPI  83 year old female presents with near syncope and stuttering. Discharged yesterday for hyponatremia treatment. Has had progressive mental status changes for about 3 weeks. Was being read a book and patient said she started to feel "sleepy" and almost passed out. Did not feel dizzy. Felt like when she's had "sugar problems". EMS was called. On their arrival patient started having stuttering speech but no slurred speech, facial droop. Gets correct words out. No weakness or headache.   Past Medical History:  Diagnosis Date  . Anemia   . Anginal pain ([place])   . Arthritis    "back, arms, hips" (09/23/2013)  . Benign hypertensive heart and kidney disease with diastolic CHF, NYHA class II and CKD stage III ([place])   . Blood transfusion 1972   "after daughter born, attempted to give me blood; couldn't give it cause my blood was cold" (09/23/2013)  . CAD (coronary artery disease)    a. LHC 08/23/11: dLM 40-50%, oRI 40%, oCFX 40%, oD1 70% (small and not amenable to PCI).  LM lesion did not appear to be flow limiting.  Medical rx was recommended;  b. Echo 08/23/11: mild LVH, EF 60-65%, grade 1 diast dysfxn, mild BAE, PASP 24;  c. 02/2012  Cath: LM 50d, LAD 50p, D1 70ost, RI 70p, RCA ok->Med Rx;  d. 01/2013 Cardiolite: EF 88, no ischemia/infarct.  . Carotid stenosis    a. dopplers 10/12:  0-39% bilat ICA;  b. 10/2012 U/S: 0-39% bilat, f/u 1 yr (10/2013).  . Chronic bronchitis   . Diverticulosis of colon (without mention of hemorrhage)   . GERD (gastroesophageal reflux disease)   . Heart murmur, systolic    2-D echo in December 2011 showed a normal EF with grade 1 diastolic dysfunction, trivial pulmonary regurgitation and mildly elevated PA pressure at 37 mmHg probably  secondary to her COPD.dynamic obstruction-mid cavity obliteration;  b. 02/2012 Echo: EF 60-65%, mild LVH, PASP 69mmHg.  Marland Kitchen History of migraine headaches    a. Next atypical symptoms in the past. Patient was started on Neurontin for possible neuropathic origin of her pain.  Marland Kitchen History of stomach ulcers 1970's  . Hyperlipidemia   . Hypertension   . Hypothyroidism   . Migraines   . Polymyalgia rheumatica ([place])   . Shortness of breath on exertion    "just related to angina >1 yr ago" (09/23/2013)  . Sinus arrhythmia   . Type II diabetes mellitus ([place])    a. On oral hypoglycemic agents.    Patient Active Problem List   Diagnosis Date Noted  . Hypertensive urgency 11/27/2016  . Malnutrition of moderate degree 11/27/2016  . Altered mental status   . Appetite loss   . Constipation   . Hyponatremia 11/26/2016  . Elevated alkaline phosphatase level 09/18/2016  . Mixed incontinence urge and stress 05/09/2016  . Dizziness 04/11/2016  . Exertional dyspnea 09/11/2015  . Hypertensive retinopathy of both eyes, grade 2 04/20/2015  . Lumbar spondylosis 06/16/2014  . Carpal tunnel syndrome 02/18/2014  . Right shoulder pain 02/02/2014  . Osteopenia 01/11/2013  . Routine adult health maintenance 01/11/2013  . Chronic renal insufficiency, stage III (moderate) 09/21/2012  . Recurrent cough 09/18/2012  . Diverticulosis 09/18/2012  . Allergic  rhinitis 02/13/2012  . Carotid stenosis 09/25/2011  . Coronary artery disease 08/29/2011  . HEART MURMUR, SYSTOLIC [id number]  . Enlargement of clavicle 03/30/2008  . Solitary pulmonary nodule 12/15/2006  . Hyperlipidemia 11/28/2006  . Hypothyroidism 09/04/2006  . Type 2 diabetes mellitus, controlled ([place]) 09/04/2006  . Migraine 09/04/2006  . Essential hypertension 09/04/2006  . GERD 09/04/2006    Past Surgical History:  Procedure Laterality Date  . CARDIAC CATHETERIZATION N/A 09/26/2015   Procedure: Right/Left Heart Cath and Coronary Angiography;   Surgeon: Larey Dresser, MD;  Location: [facility] CV LAB;  Service: Cardiovascular;  Laterality: N/A;  . CATARACT EXTRACTION W/ INTRAOCULAR LENS  IMPLANT, BILATERAL Bilateral 1990's  . LEFT HEART CATHETERIZATION WITH CORONARY ANGIOGRAM N/A 03/16/2012   Procedure: LEFT HEART CATHETERIZATION WITH CORONARY ANGIOGRAM;  Surgeon: Hillary Bow, MD;  Location: [facility] CATH LAB;  Service: Cardiovascular;  Laterality: N/A;  . TOTAL ABDOMINAL HYSTERECTOMY  1976    OB History    No data available       Home Medications    Prior to Admission medications   Medication Sig Start Date End Date Taking? Authorizing Provider  acetaminophen (TYLENOL) 500 MG tablet Take 1,000 mg by mouth every 6 (six) hours as needed for headache. For pain   Yes Historical Provider, MD  ADVAIR DISKUS 250-50 MCG/DOSE AEPB inhale 1 dose by mouth every 12 hours 09/12/16  Yes [name], MD  amLODipine (NORVASC) 10 MG tablet Take 1 tablet (10 mg total) by mouth daily. 11/29/16  Yes Asencion Partridge, MD  Ascorbic Acid (VITAMIN C PO) Take 1 tablet by mouth daily.    Yes Historical Provider, MD  atorvastatin (LIPITOR) 40 MG tablet Take 1 tablet (40 mg total) by mouth every evening. 03/08/16  Yes Oval Linsey, MD  clopidogrel (PLAVIX) 75 MG tablet take 1 tablet by mouth every evening 10/14/16  Yes [name], MD  esomeprazole (NEXIUM) 40 MG capsule take 1 capsule by mouth once daily BEFORE BREAKFAST 08/06/16  Yes Nelida Meuse III, MD  feeding supplement, ENSURE ENLIVE, (ENSURE ENLIVE) LIQD Take 237 mLs by mouth 2 (two) times daily between meals. 11/28/16  Yes Asencion Partridge, MD  fluticasone (FLONASE) 50 MCG/ACT nasal spray Place 1 spray into both nostrils daily. Patient taking differently: Place 1 spray into both nostrils daily as needed for allergies.  11/08/15 11/29/16 Yes [name], MD  glimepiride (AMARYL) 1 MG tablet take 1 tablet by mouth once daily BEFORE BREAKFAST 08/16/16  Yes [name], MD    guaiFENesin-dextromethorphan [facility] DM) 100-10 MG/5ML syrup Take 5 mLs by mouth every 4 (four) hours as needed for cough. 10/02/16  Yes [name], MD  hydroxypropyl methylcellulose (ISOPTO TEARS) 2.5 % ophthalmic solution Place 1 drop into both eyes at bedtime.    Yes Historical Provider, MD  imipramine (TOFRANIL) 25 MG tablet Take 1 tablet (25 mg total) by mouth 2 (two) times daily. Patient taking differently: Take 50 mg by mouth 2 (two) times daily.  10/02/16  Yes [name], MD  isosorbide mononitrate (IMDUR) 30 MG 24 hr tablet Take 30 mg by mouth daily.   Yes Historical Provider, MD  levothyroxine (SYNTHROID, LEVOTHROID) 25 MCG tablet take 1 tablet by mouth once daily 08/05/16  Yes [name], MD  loratadine (CLARITIN) 10 MG tablet Take 10 mg by mouth daily as needed for allergies, rhinitis or itching.   Yes Historical Provider, MD  metoprolol succinate (TOPROL XL) 25 MG 24 hr  tablet Take 1 tablet (25 mg total) by mouth daily. 10/31/16  Yes Larey Dresser, MD  Multiple Vitamin (MULTIVITAMIN WITH MINERALS) TABS tablet Take 1 tablet by mouth daily. 11/29/16  Yes Asencion Partridge, MD  NITROSTAT 0.4 MG SL tablet place 1 tablet under the tongue if needed every 5 minutes for chest pain for 3 doses IF NO RELIEF AFTER 3RD DOSE CALL PRESCRIBER OR 911. 09/09/16  Yes [name], MD  ondansetron (ZOFRAN) 4 MG tablet Take 1 tablet (4 mg total) by mouth every 6 (six) hours as needed for nausea. 11/28/16  Yes Asencion Partridge, MD  polycarbophil (FIBERCON) 625 MG tablet Take 1 tablet (625 mg total) by mouth daily. 11/28/16  Yes Asencion Partridge, MD  polyethylene glycol [facility] / GLYCOLAX) packet Take 17 g by mouth daily as needed. 11/28/16  Yes Asencion Partridge, MD  senna (SENOKOT) 8.6 MG TABS tablet Take 1 tablet (8.6 mg total) by mouth at bedtime as needed for mild constipation. 11/28/16  Yes Asencion Partridge, MD  valsartan (DIOVAN) 160 MG tablet Take 1 tablet (160 mg total) by mouth daily. 04/19/16  Yes Almyra Deforest,  PA    Family History Family History  Problem Relation Age of Onset  . Colon cancer Maternal Grandmother   . Heart attack Maternal Grandmother   . COPD Father   . Other Mother     died of unknown causes in her 75's. Pt raised by grandmother.  . Pulmonary Hypertension Daughter     Social History Social History  Substance Use Topics  . Smoking status: Never Smoker  . Smokeless tobacco: Never Used  . Alcohol use No     Comment: "Last drink 1967"     Allergies   Aspirin; Nsaids; Codeine; Other; and Penicillins   Review of Systems Review of Systems  Constitutional: Negative for fever.  Respiratory: Negative for shortness of breath.   Cardiovascular: Negative for chest pain.  Gastrointestinal: Negative for vomiting.  Genitourinary: Negative for dysuria.  Neurological: Positive for speech difficulty. Negative for dizziness, weakness, light-headedness and headaches.  All other systems reviewed and are negative.    Physical Exam Updated Vital Signs BP 197/75 (BP Location: Right Arm)   Pulse 87   Temp 97.7 F (36.5 C) (Rectal)   Resp 15   LMP 02/11/1975   SpO2 98%   Physical Exam  Constitutional: She is oriented to person, place, and time. She appears well-developed and well-nourished.  HENT:  Head: Normocephalic and atraumatic.  Right Ear: External ear normal.  Left Ear: External ear normal.  Nose: Nose normal.  Eyes: EOM are normal. Pupils are equal, round, and reactive to light. Right eye exhibits no discharge. Left eye exhibits no discharge.  Cardiovascular: Normal rate, regular rhythm and normal heart sounds.   Pulmonary/Chest: Effort normal and breath sounds normal.  Abdominal: Soft. There is no tenderness.  Neurological: She is alert and oriented to person, place, and time.  CN 3-12 grossly intact. 5/5 strength in all 4 extremities. Grossly normal sensation. Normal finger to nose. Stuttering speech. Seems to worsen when agitated, occasionally improves when  distracted  Skin: Skin is warm and dry.  Nursing note and vitals reviewed.    ED Treatments / Results  Labs (all labs ordered are listed, but only abnormal results are displayed) Labs Reviewed  BASIC METABOLIC PANEL - Abnormal; Notable for the following:       Result Value   Sodium 126 (*)    Chloride 94 (*)    CO2  21 (*)    Glucose, Bld 185 (*)    All other components within normal limits  CBC WITH DIFFERENTIAL/PLATELET - Abnormal; Notable for the following:    HCT 35.1 (*)    All other components within normal limits  OSMOLALITY  SODIUM, URINE, RANDOM  OSMOLALITY, URINE  BASIC METABOLIC PANEL  CBG MONITORING, ED    EKG  EKG Interpretation  Date/Time:  Friday November 29 2016 13:16:01 EST Ventricular Rate:  87 PR Interval:    QRS Duration: 85 QT Interval:  369 QTC Calculation: 444 R Axis:   -40 Text Interpretation:  Normal sinus rhythm Multiple premature complexes, vent & supraven Left axis deviation Confirmed by Jarion Hawthorne MD, Eon Zunker [id number]) on 11/29/2016 1:37:09 PM       Radiology No results found.  Procedures Procedures (including critical care time)  Medications Ordered in ED Medications  mometasone-formoterol (DULERA) 200-5 MCG/ACT inhaler 2 puff (not administered)  amLODipine (NORVASC) tablet 10 mg (not administered)  atorvastatin (LIPITOR) tablet 40 mg (not administered)  clopidogrel (PLAVIX) tablet 75 mg (not administered)  pantoprazole (PROTONIX) EC tablet 40 mg (not administered)  feeding supplement (ENSURE ENLIVE) (ENSURE ENLIVE) liquid 237 mL (not administered)  fluticasone (FLONASE) 50 MCG/ACT nasal spray 1 spray (not administered)  hydroxypropyl methylcellulose / hypromellose (ISOPTO TEARS / GONIOVISC) 2.5 % ophthalmic solution 1 drop (not administered)  isosorbide mononitrate (IMDUR) 24 hr tablet 30 mg (not administered)  levothyroxine (SYNTHROID, LEVOTHROID) tablet 25 mcg (not administered)  loratadine (CLARITIN) tablet 10 mg (not administered)    metoprolol succinate (TOPROL-XL) 24 hr tablet 25 mg (not administered)  multivitamin with minerals tablet 1 tablet (not administered)  polyethylene glycol (MIRALAX / GLYCOLAX) packet 17 g (not administered)  senna (SENOKOT) tablet 8.6 mg (not administered)  insulin aspart (novoLOG) injection 0-9 Units (not administered)  insulin aspart (novoLOG) injection 0-5 Units (not administered)  sodium chloride 0.9 % bolus 500 mL (0 mLs Intravenous Stopped 11/29/16 1821)     Initial Impression / Assessment and Plan / ED Course  I have reviewed the triage vital signs and the nursing notes.  Pertinent labs & imaging results that were available during my care of the patient were reviewed by me and considered in my medical decision making (see chart for details).  Clinical Course     Labs show recurrent hyponatremia, down to 126 after being at 134 yesterday morning. Unclear if this is causing her changes. The stuttering speech does not appear to be acutely neurologic. Seems to improve while in ED until she became acutely agitated about not having her evening meds (even though it was early afternoon) and not eating lunch. Became quite hypertensive. Very pronounced stuttering speech, almost unable to understand except a few words. Calmed down by myself and slowly BP improved. Given a sandwich and after eating is much more relaxed. Now speaking completely normally. I think for the possible near syncope and recurrent hyponatremia, she needs repeat observation on tele. I do no think she is suffering from a stroke. Admit to internal medicine teaching service.   Final Clinical Impressions(s) / ED Diagnoses   Final diagnoses:    Hyponatremia    New Prescriptions New Prescriptions   No medications on file     Sherwood Gambler, MD 11/29/16 2010

## 2016-11-29 NOTE — ED Notes (Signed)
Pt ambulatory to the restroom.  

## 2016-11-29 NOTE — ED Notes (Signed)
Gave Pt [place] sandwich and drink per ok Dr. Regenia Skeeter. Pt eating at this time and calm. Pt daughter at bedside.

## 2016-11-29 NOTE — ED Notes (Signed)
Paged Dr. Evette Doffing in regards to pt's BP of 195/75. Per MD, will continue to monitor, pt asymptomatic at this time.

## 2016-11-29 NOTE — Telephone Encounter (Signed)
Call from pt's daughter - states Christine [name] is in the ED and just don't know what's going on w/her mother. Told her she's in the right place so she can be evaluated based on what she told me - states pt had been stuttering, having pds of crying, forgetting to take her medications;"Just not there". Told her I will let Dr Daryll Drown know.

## 2016-11-29 NOTE — ED Triage Notes (Addendum)
Pt arrived via EMS from home. Per EMS pt altered LOC x 3 weeks, and new onset "stuttering" since 1220 today, near syncopal episode earlier today . Resp e/u; A&Ox4. NAD noted at this time.

## 2016-11-29 NOTE — H&P (Signed)
Date: 11/29/2016               Patient Name:  Christine [name] MRN: [id number]  DOB: 01/24/1934 Age / Sex: 83 y.o., female   PCP: [name], MD         Medical Service: Internal Medicine Teaching Service         Attending Physician: Dr. Axel Filler, MD    First Contact: Dr. Wynetta Emery  Pager: [phone]  Second Contact: Dr. Tiburcio Pea Pager: [phone]       After Hours (After 5p/  First Contact Pager: [phone]  weekends / holidays): Second Contact Pager: [phone]   Chief Complaint: stuttering speech and not acting herself   History of Present Illness: Christine [name] is a 83 y.o. female with a PMH of HTN, HLD, hypothyroidism, CAD, DM2, COPD, and CKD who presents with stuttering speech and not acting her self. She was recently hospitalized 1/9 -11/28/2016 for changes in mental status and confusion and found to be hyponatremic with hypertensive urgency. She reported increased water intake with minimal diet for months and had low serum and urine osmolality. She was fluid restricted and her sodium corrected and she was ambulating and eating and drinking well on the day of discharge. Her daughter reports that she was back to her normal self yesterday except for inapropriate crying. This morning her daughter found her blood pressure to be SBP 210 and blood glucose 313. She took her medications this morning and drank 3 16-ounce bottles of water but had not eaten any food. Her daughter called the clinic and was advised to bring the patient to the internal medicine acute care clinic. The patients family brought her to the ED where she was found to have BP 200/90s and Na 126. She was given a 500cc NS bolus and admitted for hyponatremia and hypertensive urgency. While in the ED she was found to start stuttering her words, she was completely aware of what was happening during the incident and says that she was feeling frustrated and was trying to answer a question which she could not remember the  answer to at the time.  She denies changes in vision  Of note the daughter and patient mention that she has been treated for depression for many years but has been experiencing more guilt, anxiety, decreased sleep, sadness, and depression recently.   Meds:  Current Meds  Medication Sig  . acetaminophen (TYLENOL) 500 MG tablet Take 1,000 mg by mouth every 6 (six) hours as needed for headache. For pain  . ADVAIR DISKUS 250-50 MCG/DOSE AEPB inhale 1 dose by mouth every 12 hours  . amLODipine (NORVASC) 10 MG tablet Take 1 tablet (10 mg total) by mouth daily.  . Ascorbic Acid (VITAMIN C PO) Take 1 tablet by mouth daily.   Marland Kitchen atorvastatin (LIPITOR) 40 MG tablet Take 1 tablet (40 mg total) by mouth every evening.  . clopidogrel (PLAVIX) 75 MG tablet take 1 tablet by mouth every evening  . esomeprazole (NEXIUM) 40 MG capsule take 1 capsule by mouth once daily BEFORE BREAKFAST  . feeding supplement, ENSURE ENLIVE, (ENSURE ENLIVE) LIQD Take 237 mLs by mouth 2 (two) times daily between meals.  . fluticasone (FLONASE) 50 MCG/ACT nasal spray Place 1 spray into both nostrils daily. (Patient taking differently: Place 1 spray into both nostrils daily as needed for allergies. )  . glimepiride (AMARYL) 1 MG tablet take 1 tablet by mouth once daily BEFORE BREAKFAST  . guaiFENesin-dextromethorphan (  ROBITUSSIN DM) 100-10 MG/5ML syrup Take 5 mLs by mouth every 4 (four) hours as needed for cough.  . hydroxypropyl methylcellulose (ISOPTO TEARS) 2.5 % ophthalmic solution Place 1 drop into both eyes at bedtime.   Marland Kitchen imipramine (TOFRANIL) 25 MG tablet Take 1 tablet (25 mg total) by mouth 2 (two) times daily. (Patient taking differently: Take 50 mg by mouth 2 (two) times daily. )  . isosorbide mononitrate (IMDUR) 30 MG 24 hr tablet Take 30 mg by mouth daily.  Marland Kitchen levothyroxine (SYNTHROID, LEVOTHROID) 25 MCG tablet take 1 tablet by mouth once daily  . loratadine (CLARITIN) 10 MG tablet Take 10 mg by mouth daily as needed for  allergies, rhinitis or itching.  . metoprolol succinate (TOPROL XL) 25 MG 24 hr tablet Take 1 tablet (25 mg total) by mouth daily.  . Multiple Vitamin (MULTIVITAMIN WITH MINERALS) TABS tablet Take 1 tablet by mouth daily.  Marland Kitchen NITROSTAT 0.4 MG SL tablet place 1 tablet under the tongue if needed every 5 minutes for chest pain for 3 doses IF NO RELIEF AFTER 3RD DOSE CALL PRESCRIBER OR 911.  . ondansetron (ZOFRAN) 4 MG tablet Take 1 tablet (4 mg total) by mouth every 6 (six) hours as needed for nausea.  . polycarbophil (FIBERCON) 625 MG tablet Take 1 tablet (625 mg total) by mouth daily.  . polyethylene glycol (MIRALAX / GLYCOLAX) packet Take 17 g by mouth daily as needed.  . senna (SENOKOT) 8.6 MG TABS tablet Take 1 tablet (8.6 mg total) by mouth at bedtime as needed for mild constipation.  . valsartan (DIOVAN) 160 MG tablet Take 1 tablet (160 mg total) by mouth daily.     Allergies: Allergies as of 11/29/2016 - Review Complete 11/27/2016  Allergen Reaction Noted  . Aspirin Swelling and Other (See Comments)   . Nsaids Other (See Comments) 11/25/2016  . Codeine Nausea And Vomiting and Other (See Comments)   . Other Other (See Comments) 05/08/2016  . Penicillins Diarrhea 12/29/2011   Past Medical History:  Diagnosis Date  . Anemia   . Anginal pain ([place])   . Arthritis    "back, arms, hips; hands" (11/30/2015)  . Benign hypertensive heart and kidney disease with diastolic CHF, NYHA class II and CKD stage III ([place])   . Blood transfusion 1972   "after daughter born, attempted to give me blood; couldn't give it cause my blood was cold" (09/23/2013)  . CAD (coronary artery disease)    a. LHC 08/23/11: dLM 40-50%, oRI 40%, oCFX 40%, oD1 70% (small and not amenable to PCI).  LM lesion did not appear to be flow limiting.  Medical rx was recommended;  b. Echo 08/23/11: mild LVH, EF 60-65%, grade 1 diast dysfxn, mild BAE, PASP 24;  c. 02/2012  Cath: LM 50d, LAD 50p, D1 70ost, RI 70p, RCA ok->Med Rx;  d.  01/2013 Cardiolite: EF 88, no ischemia/infarct.  . Carotid stenosis    a. dopplers 10/12:  0-39% bilat ICA;  b. 10/2012 U/S: 0-39% bilat, f/u 1 yr (10/2013).  . Chronic bronchitis   . Depression   . Diverticulosis of colon (without mention of hemorrhage)   . GERD (gastroesophageal reflux disease)   . Heart murmur, systolic    2-D echo in December 2011 showed a normal EF with grade 1 diastolic dysfunction, trivial pulmonary regurgitation and mildly elevated PA pressure at 37 mmHg probably secondary to her COPD.dynamic obstruction-mid cavity obliteration;  b. 02/2012 Echo: EF 60-65%, mild LVH, PASP 37mmHg.  Marland Kitchen History of  stomach ulcers 1970's  . Hyperlipidemia   . Hypertension   . Hypothyroidism   . Migraines    a. Next atypical symptoms in the past. Patient was started on Neurontin for possible neuropathic origin of her pain.  . Polymyalgia rheumatica ([place])   . Shortness of breath on exertion    "just related to angina >1 yr ago" (09/23/2013)  . Sinus arrhythmia   . Type II diabetes mellitus ([place])    a. On oral hypoglycemic agents.   Family History:  Family History  Problem Relation Age of Onset  . Colon cancer Maternal Grandmother   . Heart attack Maternal Grandmother   . COPD Father   . Other Mother     died of unknown causes in her 23's. Pt raised by grandmother.  . Pulmonary Hypertension Daughter    Social History: Denies smoking, alcohol, and illicit drug use. Lives with her daughter.  Review of Systems: A complete ROS was negative except as per HPI.   Physical Exam: Blood pressure (!) 196/91, pulse 86, temperature 97.5 F (36.4 C), temperature source Oral, resp. rate 18, height 5\' 4"  ([id number] m), weight 118 lb 8 oz (53.8 kg), last menstrual period 02/11/1975, SpO2 100 %. Physical Exam  Constitutional: She appears well-developed and well-nourished. No distress.  HENT:  Head: Normocephalic and atraumatic.  Cardiovascular: Normal rate and regular rhythm.   Murmur  heard. Pulmonary/Chest: Effort normal. No respiratory distress. She has no wheezes.  Abdominal: Soft. She exhibits no distension. There is no tenderness.  Neurological: She is alert. She has normal strength. No cranial nerve deficit.  Tremor of tongue and cheeks during cranial nerve testing   Skin: Skin is warm and dry. She is not diaphoretic.  Psychiatric: Her affect is not angry. She exhibits a depressed mood.    EKG: NSR, PVC, left axis deviation  Assessment & Plan by Problem: Active Problems:   Hyponatremia  Hyponatremia   83 y.o. female with a PMH of HTN, HLD, hypothyroidism, CAD, DM2, COPD, and CKD who presents with stuttering speech and agitation and was found to have hyponatremia (128 when corrected for hyperglycemia) and hypertensive urgency. Her symptoms of acutely altered personality have resolved at this time. CT head on prior admission showed no acute intracranial abnormalities. On previous admission sodium corrected with fluid restriction and increased food intake suggesting that this hyponatremia is related to increased water intake. Received 500 cc NS in the ED and sodium improved to 133.  -no further fluids at this time  -follow up morning bmet  -follow up urine osmolality and sodium  - hold imipramine   Hypertensive urgency  Did not take the amlodipine prescribed at discharge and drank copious amounts of water this morning. SBP 200s on admission. Had stuttering speech and agitation at presentation but those have resolved and she is currently asymptomatic. PRES is a concern but her resolution of ams is reassuring, will continue to monitor.  - Ordered amlodipine 10 mg qd and metoprolol 12.5 qd -holding valsartan 160 mg qd with contrast load for CT abdomen  - Hydralizine 5 mg PRN SBP >180  -telemetry monitoring   Protein calorie malnutrition  -ordered ensure feeding supplements and MVM   Constipation and weight loss -follow up CT abdomen and pelvis  -ordered miralax  and senna    Coronary artery disease  Left heart cath 09/2015 40-50% left main stenosis. -Continued home medication atorvastatin 40 mg qd, plavix 75 mg qd, IMDUR 30 mg qd  Non insulin dependent Type 2  diabetes Last A1c 10/2016 6.6  - ordered with sensitive ISS with HS coverage and CBGs  Hypothyroid  TSH 1/9 2.1 - ordered home medication synthroid 25 mg qd   COPD  -ordered home meds dulera BID, loratadine, and fluticasone   DVT Ppx Lovenox  Code Status FULL   Dispo: Admit patient to Observation with expected length of stay less than 2 midnights.  Signed: Ledell Noss, MD 11/29/2016, 10:41 PM  Pager: [phone]

## 2016-11-29 NOTE — Telephone Encounter (Signed)
We added amlodipine on discharge, do you know if she was able to pick that up?  I would have her come in to the clinic.  We have been trying to manage her dizziness (orthostasis) and elevated blood pressure and it has been difficult.  Can you please have her come back in to the clinic today if possible for BP check, BMET, and further evaluation?   Thanks

## 2016-11-29 NOTE — Telephone Encounter (Signed)
First: Spoke w/ BJ from [facility], [facility] will visit for first time today but daughter had called [facility] and states she is worried about mother. This am pt has blood sugar of 311 then caregivers had her walk and drink water and it is now at 1140 282. Pt tells caregivers her vision is blurry. Her BP this am is 212/86, she has taken medicine so far as prescribed.  I cannot see disch summary yet. Please advise, family wants to know should she come back or what do they do?

## 2016-11-29 NOTE — Telephone Encounter (Signed)
Pt daughter called requesting to speak with a nurse regarding blood sugar and bp. Please call back.

## 2016-11-30 ENCOUNTER — Encounter (HOSPITAL_COMMUNITY): Payer: Self-pay | Admitting: Radiology

## 2016-11-30 ENCOUNTER — Observation Stay (HOSPITAL_COMMUNITY): Payer: Medicare Other

## 2016-11-30 DIAGNOSIS — E46 Unspecified protein-calorie malnutrition: Secondary | ICD-10-CM

## 2016-11-30 DIAGNOSIS — E871 Hypo-osmolality and hyponatremia: Principal | ICD-10-CM

## 2016-11-30 DIAGNOSIS — I2510 Atherosclerotic heart disease of native coronary artery without angina pectoris: Secondary | ICD-10-CM

## 2016-11-30 DIAGNOSIS — K5900 Constipation, unspecified: Secondary | ICD-10-CM

## 2016-11-30 DIAGNOSIS — Z7951 Long term (current) use of inhaled steroids: Secondary | ICD-10-CM

## 2016-11-30 DIAGNOSIS — Z79899 Other long term (current) drug therapy: Secondary | ICD-10-CM | POA: Diagnosis not present

## 2016-11-30 DIAGNOSIS — E039 Hypothyroidism, unspecified: Secondary | ICD-10-CM

## 2016-11-30 DIAGNOSIS — I10 Essential (primary) hypertension: Secondary | ICD-10-CM

## 2016-11-30 DIAGNOSIS — E119 Type 2 diabetes mellitus without complications: Secondary | ICD-10-CM

## 2016-11-30 DIAGNOSIS — J449 Chronic obstructive pulmonary disease, unspecified: Secondary | ICD-10-CM | POA: Diagnosis not present

## 2016-11-30 DIAGNOSIS — R4182 Altered mental status, unspecified: Secondary | ICD-10-CM | POA: Diagnosis not present

## 2016-11-30 DIAGNOSIS — Z9889 Other specified postprocedural states: Secondary | ICD-10-CM | POA: Diagnosis not present

## 2016-11-30 DIAGNOSIS — Z90710 Acquired absence of both cervix and uterus: Secondary | ICD-10-CM | POA: Diagnosis not present

## 2016-11-30 DIAGNOSIS — R471 Dysarthria and anarthria: Secondary | ICD-10-CM | POA: Diagnosis not present

## 2016-11-30 DIAGNOSIS — E43 Unspecified severe protein-calorie malnutrition: Secondary | ICD-10-CM | POA: Insufficient documentation

## 2016-11-30 DIAGNOSIS — Z8489 Family history of other specified conditions: Secondary | ICD-10-CM | POA: Diagnosis not present

## 2016-11-30 DIAGNOSIS — Z7902 Long term (current) use of antithrombotics/antiplatelets: Secondary | ICD-10-CM | POA: Diagnosis not present

## 2016-11-30 DIAGNOSIS — F332 Major depressive disorder, recurrent severe without psychotic features: Secondary | ICD-10-CM | POA: Diagnosis not present

## 2016-11-30 DIAGNOSIS — F3342 Major depressive disorder, recurrent, in full remission: Secondary | ICD-10-CM | POA: Diagnosis present

## 2016-11-30 LAB — GLUCOSE, CAPILLARY
GLUCOSE-CAPILLARY: 146 mg/dL — AB (ref 65–99)
Glucose-Capillary: 273 mg/dL — ABNORMAL HIGH (ref 65–99)
Glucose-Capillary: 152 mg/dL — ABNORMAL HIGH (ref 65–99)
Glucose-Capillary: 218 mg/dL — ABNORMAL HIGH (ref 65–99)

## 2016-11-30 LAB — BASIC METABOLIC PANEL
ANION GAP: 8 (ref 5–15)
BUN: 11 mg/dL (ref 6–20)
CHLORIDE: 98 mmol/L — AB (ref 101–111)
CO2: 25 mmol/L (ref 22–32)
Calcium: 10.1 mg/dL (ref 8.9–10.3)
Creatinine, Ser: 0.86 mg/dL (ref 0.44–1.00)
GFR calc Af Amer: >60 mL/min (ref >60)
GLUCOSE: 148 mg/dL — AB (ref 65–99)
POTASSIUM: 4.5 mmol/L (ref 3.5–5.1)
Sodium: 131 mmol/L — ABNORMAL LOW (ref 135–145)

## 2016-11-30 LAB — CBC
HEMATOCRIT: 39.8 % (ref 36.0–46.0)
HEMOGLOBIN: 13.5 g/dL (ref 12.0–15.0)
MCH: 29.8 pg (ref 26.0–34.0)
MCHC: 33.9 g/dL (ref 30.0–36.0)
MCV: 87.9 fL (ref 78.0–100.0)
Platelets: 331 10*3/uL (ref 150–400)
RBC: 4.53 MIL/uL (ref 3.87–5.11)
RDW: 14.8 % (ref 11.5–15.5)
WBC: 7.2 10*3/uL (ref 4.0–10.5)

## 2016-11-30 LAB — OSMOLALITY, URINE: OSMOLALITY UR: 413 mosm/kg (ref 300–900)

## 2016-11-30 LAB — SODIUM, URINE, RANDOM: Sodium, Ur: 66 mmol/L

## 2016-11-30 MED ORDER — ENSURE ENLIVE PO LIQD
237.0000 mL | Freq: Three times a day (TID) | ORAL | Status: DC
  Administered 2016-11-30 – 2016-12-02 (×6): 237 mL via ORAL

## 2016-11-30 MED ORDER — LORAZEPAM 2 MG/ML IJ SOLN: 1.0000 mg | Freq: Once | INTRAMUSCULAR | Status: DC

## 2016-11-30 MED ORDER — IOPAMIDOL (ISOVUE-300) INJECTION 61%
INTRAVENOUS | Status: AC
  Filled 2016-11-30: qty 30

## 2016-11-30 MED ORDER — HYDROXYZINE HCL 10 MG PO TABS
10.0000 mg | ORAL_TABLET | Freq: Three times a day (TID) | ORAL | Status: DC
  Administered 2016-11-30 – 2016-12-02 (×6): 10 mg via ORAL
  Filled 2016-11-30 (×7): qty 1

## 2016-11-30 MED ORDER — VENLAFAXINE HCL ER 37.5 MG PO CP24
37.5000 mg | ORAL_CAPSULE | Freq: Every day | ORAL | Status: DC
  Administered 2016-11-30 – 2016-12-01 (×2): 37.5 mg via ORAL
  Filled 2016-11-30 (×2): qty 1

## 2016-11-30 MED ORDER — LORAZEPAM 2 MG/ML IJ SOLN
0.5000 mg | Freq: Once | INTRAMUSCULAR | Status: AC
  Administered 2016-11-30: 0.5 mg via INTRAVENOUS
  Filled 2016-11-30: qty 1

## 2016-11-30 MED ORDER — IRBESARTAN 300 MG PO TABS
150.0000 mg | ORAL_TABLET | Freq: Every day | ORAL | Status: DC
  Administered 2016-11-30 – 2016-12-01 (×2): 150 mg via ORAL
  Filled 2016-11-30 (×2): qty 1

## 2016-11-30 MED ORDER — ONDANSETRON HCL 4 MG/2ML IJ SOLN: 4.0000 mg | Freq: Three times a day (TID) | INTRAMUSCULAR | Status: DC | PRN

## 2016-11-30 MED ORDER — IOPAMIDOL (ISOVUE-300) INJECTION 61%
INTRAVENOUS | Status: AC
  Administered 2016-11-30: 75 mL
  Filled 2016-11-30: qty 100

## 2016-11-30 NOTE — Progress Notes (Addendum)
Initial Nutrition Assessment  DOCUMENTATION CODES:   Severe malnutrition in context of chronic illness  INTERVENTION:  Glucerna Shake po TID, each supplement provides 220 kcal and 10 grams of protein  Magic cup TID with meals, each supplement provides 290 kcal and 9 grams of protein  MVI Daily   NUTRITION DIAGNOSIS:   Malnutrition related to chronic illness as evidenced by severe depletion of muscle mass, severe depletion of body fat, percent weight loss.  GOAL:   Patient will meet greater than or equal to 90% of their needs  MONITOR:   PO intake, Supplement acceptance, Weight trends, I & O's  REASON FOR ASSESSMENT:   Malnutrition Screening Tool    ASSESSMENT:   83 y.o. female with a PMH of HTN, HLD, hypothyroidism, CAD, DM2, COPD, and CKD who presents with stuttering speech and not acting her self. She was recently hospitalized 1/9 -11/28/2016 for changes in mental status and confusion and found to be hyponatremic with hypertensive urgency. She reported increased water intake with minimal diet for months and had low serum and urine osmolality. She was fluid restricted and her sodium corrected and she was ambulating and eating and drinking well on the day of discharge. Her daughter reports that she was back to her normal self yesterday except for inapropriate crying. This morning her daughter found her blood pressure to be SBP 210 and blood glucose 313. She took her medications this morning and drank 3 16-ounce bottles of water but had not eaten any food. Her daughter called the clinic and was advised to bring the patient to the internal medicine acute care clinic. The patients family brought her to the ED where she was found to have BP 200/90s and Na 126. She was given a 500cc NS bolus and admitted for hyponatremia and hypertensive urgency.    Met with pt in room today. Pt eating 100% meals. Per family, pt with poor appetite and oral intake for 2 weeks pta. Per chart, pt has lost  13lbs(10%) in 4 months. This is significant. Will order supplements. Pt with hyponatremia today. Continue to monitor.   Medications reviewed and include: Plavix, lovenox, insulin, synthroid, MVI, protonix  Labs reviewed: Na 131(L), Cl 98(L) cbgs- 185, 166, 148 x 24hrs  Nutrition-Focused physical exam completed. Findings are severe fat depletion, severe muscle depletion, and no edema.   Diet Order:  Diet Heart Room service appropriate? Yes; Fluid consistency: Thin  Skin:  Reviewed, no issues  Last BM:  1/11  Height:   Ht Readings from Last 1 Encounters:  11/29/16 _0  (1.626 m)    Weight:   Wt Readings from Last 1 Encounters:  11/30/16 114 lb 12.8 oz (52.1 kg)    Ideal Body Weight:  54.5 kg  BMI:  Body mass index is 19.71 kg/m.  Estimated Nutritional Needs:   Kcal:  1500-1800kcal/day   Protein:  62-78g/day   Fluid:  >1.5L/day   EDUCATION NEEDS:   No education needs identified at this time  Koleen Distance, RD, LDN Pager #[phone]

## 2016-11-30 NOTE — Significant Event (Signed)
Rapid Response Event Note  Overview: Time Called: 1110 Event Type: Neurologic  Initial Focused Assessment/Interventions: Patient admitted with AMS, HTN and hyponatremia.  Per family she had 3 episodes yesterday of stuttering speech and that lasted about 10 min. Per staff and family she was interacting normally this am.  She is currently not talking to anyone and will not follow commands. BP 234/71  SR with PACs 90s  RR 18  O2 sat 100% on RA After about 5-10 min she started to interact with staff and family at bedside.  She was then talking and crying with family and chaplin. Orders received for stat head CT Transported to radiology for head CT and abdominal CT Patient became very upset and agitated while in radiology.  Assisted back to the room.  Unable to tolerate MRI at this time.  Vanduser notified Dr Tiburcio Pea of agitation.  Plan premedication for MRI, and one time Ativan dose for current agitation/mental distress.      Plan of Care (if not transferred): RN to call if assistance needed  Event Summary: Name of Physician Notified: Saraiya at 1110    at    Outcome: Stayed in room and stabalized  Event End Time: Saline  Raliegh Ip

## 2016-11-30 NOTE — Progress Notes (Signed)
Pt noted to be weak and place back to bed at 1101 BP 146/80 around 1100.  Denies any pain.  C/o of nausea.  No nausea med ordered and instructed pt's daughter will informed MD.  Called back to room that pt was having decrease responsiveness at 1102. Repaid response nurse called.  Assess pt,  SBP greater than 200.  Dr Danford Bad &Saraiya paged and  instructed will be up to see pt. MD ordered CT head as well as MRI W0 contrast ordered.  Will continue to monitor.  Karie Kirks, Therapist, sports.

## 2016-11-30 NOTE — Progress Notes (Signed)
Notified Tiffany in MRI to inform nurse when they ready to do MRI as she has order for ativan prior to.  Instructed she will do so.  Pt's daughter at bedside made aware.  Karie Kirks, Therapist, sports.  Karie Kirks, Therapist, sports.

## 2016-11-30 NOTE — Progress Notes (Signed)
Spoke with Dr Jari Favre regarding patient's request for her Imipramine & Valsatan. Was informed that those meds are on hold secondary to her hyponatremia. Related the information to patient and family. Patient more nervous with childern  @ bedside . Given Hydralazine 5mg  iv for HTN

## 2016-11-30 NOTE — Consult Note (Signed)
[facility] Consult   Reason for Consult:  Depression and anxiety Referring Physician:  Dr. Evette Doffing Patient Identification: Christine [name] MRN:  [id number] Principal Diagnosis: MDD (major depressive disorder), recurrent severe, without psychosis ([place]) Diagnosis:   Patient Active Problem List   Diagnosis Date Noted  . Hypertensive urgency [I16.0] 11/27/2016  . Malnutrition of moderate degree [E44.0] 11/27/2016  . Altered mental status [R41.82]   . Appetite loss [R63.0]   . Constipation [K59.00]   . Hyponatremia [E87.1] 11/26/2016  . Elevated alkaline phosphatase level [R74.8] 09/18/2016  . Mixed incontinence urge and stress [N39.46] 05/09/2016  . Dizziness [R42] 04/11/2016  . Exertional dyspnea [R06.09] 09/11/2015  . Hypertensive retinopathy of both eyes, grade 2 [H35.033] 04/20/2015  . Lumbar spondylosis [M47.816] 06/16/2014  . Carpal tunnel syndrome [G56.00] 02/18/2014  . Right shoulder pain [M25.511] 02/02/2014  . Osteopenia [M85.80] 01/11/2013  . Routine adult health maintenance [Z00.00] 01/11/2013  . Chronic renal insufficiency, stage III (moderate) [N18.3] 09/21/2012  . Recurrent cough [R05] 09/18/2012  . Diverticulosis [K57.90] 09/18/2012  . Allergic rhinitis [J30.9] 02/13/2012  . Carotid stenosis [I65.29] 09/25/2011  . Coronary artery disease [I25.10] 08/29/2011  . HEART MURMUR, SYSTOLIC [R41.6] 38/45/3646  . Enlargement of clavicle [M89.319] 03/30/2008  . Solitary pulmonary nodule [R91.1] 12/15/2006  . Hyperlipidemia [E78.5] 11/28/2006  . Hypothyroidism [E03.9] 09/04/2006  . Type 2 diabetes mellitus, controlled ([place]) [E11.9] 09/04/2006  . Migraine [G43.909] 09/04/2006  . Essential hypertension [I10] 09/04/2006  . GERD [K21.9] 09/04/2006    Total Time spent with patient: 1 hour  Subjective:   Christine [name] is a 83 y.o. female patient admitted with Hyponatremia and hypertension.  HPI:  Christine [name] is a 83 y.o. female with a PMH of HTN, HLD,  hypothyroidism, CAD, DM2, COPD, and CKD who presents with stuttering speech and not acting her self. She was recently hospitalized 1/9 -11/28/2016 for changes in mental status and confusion and found to be hyponatremic with hypertensive urgency.   Patient seen, chart reviewed and case discussed with her daughter and grand daughter who are at bed side. Patient endorses "very depressed", increased psychomotor activity, loss of interest, not able to participate in her regular activities like reading and watching television etc. She also endorses disturbed sleep and appetite. She was found with altered mental status and confusion during first hospitalization and this time she has stuttering speech and elevated blood pressure. She also endorses of having excessive anxiety, anger, psychomotor agitation and does not like her sister daughter moving into her home which is additional stress to her. She feels guilty about being hospitalized when she suppose to go the her sister's funeral etc. Patient stated that my medication. "Imipramine was discontinued due to hyponatremia. I am not doing well even with the medication". She denied suicide or homicide ideations, intention or plans. She has no evidence of psychosis.   Her sodium level is trending up from 83 yo 131 since admitted. Patient has order of ativan for MRI scan today as per staff RN.    Past Psychiatric History: Depression and anxiety and was treated over the years by PCP.  Risk to Self: Is patient at risk for suicide?: No Risk to Others:   Prior Inpatient Therapy:   Prior Outpatient Therapy:    Past Medical History:  Past Medical History:  Diagnosis Date  . Anemia   . Anginal pain ([place])   . Arthritis    "back, arms, hips; hands" (11/30/2015)  . Benign hypertensive heart and kidney disease  with diastolic CHF, NYHA class II and CKD stage III ([place])   . Blood transfusion 1972   "after daughter born, attempted to give me blood; couldn't give it cause  my blood was cold" (09/23/2013)  . CAD (coronary artery disease)    a. LHC 08/23/11: dLM 40-50%, oRI 40%, oCFX 40%, oD1 70% (small and not amenable to PCI).  LM lesion did not appear to be flow limiting.  Medical rx was recommended;  b. Echo 08/23/11: mild LVH, EF 60-65%, grade 1 diast dysfxn, mild BAE, PASP 24;  c. 02/2012  Cath: LM 50d, LAD 50p, D1 70ost, RI 70p, RCA ok->Med Rx;  d. 01/2013 Cardiolite: EF 88, no ischemia/infarct.  . Carotid stenosis    a. dopplers 10/12:  0-39% bilat ICA;  b. 10/2012 U/S: 0-39% bilat, f/u 1 yr (10/2013).  . Chronic bronchitis   . Depression   . Diverticulosis of colon (without mention of hemorrhage)   . GERD (gastroesophageal reflux disease)   . Heart murmur, systolic    2-D echo in December 2011 showed a normal EF with grade 1 diastolic dysfunction, trivial pulmonary regurgitation and mildly elevated PA pressure at 37 mmHg probably secondary to her COPD.dynamic obstruction-mid cavity obliteration;  b. 02/2012 Echo: EF 60-65%, mild LVH, PASP 58mHg.  .Marland KitchenHistory of stomach ulcers 1970's  . Hyperlipidemia   . Hypertension   . Hypothyroidism   . Migraines    a. Next atypical symptoms in the past. Patient was started on Neurontin for possible neuropathic origin of her pain.  . Pol[place]algia rheumatica ([place]   . Shortness of breath on exertion    "just related to angina >1 yr ago" (09/23/2013)  . Sinus arrhythmia   . Type II diabetes mellitus ([place]    a. On oral hypoglycemic agents.    Past Surgical History:  Procedure Laterality Date  . CARDIAC CATHETERIZATION N/A 09/26/2015   Procedure: Right/Left Heart Cath and Coronary Angiography;  Surgeon: DLarey Dresser MD;  [facility];  Service: Cardiovascular;  Laterality: N/A;  . CATARACT EXTRACTION W/ INTRAOCULAR LENS  IMPLANT, BILATERAL Bilateral 1990's  . LEFT HEART CATHETERIZATION WITH CORONARY ANGIOGRAM N/A 03/16/2012   Procedure: LEFT HEART CATHETERIZATION WITH CORONARY ANGIOGRAM;  Surgeon: THillary Bow MD;  [facility];  Service: Cardiovascular;  Laterality: N/A;  . VAGINAL HYSTERECTOMY  1976   Family History:  Family History  Problem Relation Age of Onset  . Colon cancer Maternal Grandmother   . Heart attack Maternal Grandmother   . COPD Father   . Other Mother     died of unknown causes in her 289's Pt raised by grandmother.  . Pulmonary Hypertension Daughter    Family Psychiatric  History: non contributory Social History:  History  Alcohol Use No    Comment: 11/29/2016 "Last drink 1967"     History  Drug Use No    Social History   Social History  . Marital status: Widowed    Spouse name: N/A  . Number of children: N/A  . Years of education: N/A   Social History Main Topics  . Smoking status: Never Smoker  . Smokeless tobacco: Never Used  . Alcohol use No     Comment: 11/29/2016 "Last drink 1967"  . Drug use: No  . Sexual activity: Not Currently   Other Topics Concern  . None   Social History Narrative   Never smoked[place]  Care for her daughter who has  had a lung transplant. Retired Building surveyor.   Additional Social History:    Allergies:   Allergies  Allergen Reactions  . Aspirin Swelling and Other (See Comments)    Angioedema  . Nsaids Other (See Comments)    "interacts with heart medications"  . Codeine Nausea And Vomiting and Other (See Comments)    "makes me deathly sick" - can take with nausea medicine   . Other Other (See Comments)    MSG  . Penicillins Diarrhea    "real bad" Has patient had a PCN reaction causing immediate rash, facial/tongue/throat swelling, SOB or lightheadedness with hypotension: Yes Has patient had a PCN reaction causing severe rash involving mucus membranes or skin necrosis: No Has patient had a PCN reaction that required hospitalization No Has patient had a PCN reaction occurring within the last 10 years: Yes If all of the above answers are "NO", then may proceed with  Cephalosporin use.     Labs:  Results for orders placed or performed during the hospital encounter of 11/29/16 (from the past 48 hour(s))  Basic metabolic panel     Status: Abnormal   Collection Time: 11/29/16  2:10 PM  Result Value Ref Range   Sodium 126 (L) 135 - 145 mmol/L   Potassium 4.5 3.5 - 5.1 mmol/L   Chloride 94 (L) 101 - 111 mmol/L   CO2 21 (L) 22 - 32 mmol/L   Glucose, Bld 185 (H) 65 - 99 mg/dL   BUN 12 6 - 20 mg/dL   Creatinine, Ser 0.79 0.44 - 1.00 mg/dL   Calcium 9.6 8.9 - 10.3 mg/dL   GFR calc non Af Amer >60 >60 mL/min   GFR calc Af Amer >60 >60 mL/min    Comment: (NOTE) The eGFR has been calculated using the CKD EPI equation. This calculation has not been validated in all clinical situations. eGFR's persistently <60 mL/min signify possible Chronic Kidney Disease.    Anion gap 11 5 - 15  CBC with Differential     Status: Abnormal   Collection Time: 11/29/16  2:10 PM  Result Value Ref Range   WBC 7.7 4.0 - 10.5 K/uL   RBC 4.03 3.87 - 5.11 MIL/uL   Hemoglobin 12.1 12.0 - 15.0 g/dL   HCT 35.1 (L) 36.0 - 46.0 %   MCV 87.1 78.0 - 100.0 fL   MCH 30.0 26.0 - 34.0 pg   MCHC 34.5 30.0 - 36.0 g/dL   RDW 14.3 11.5 - 15.5 %   Platelets 333 150 - 400 K/uL   Neutrophils Relative % 66 %   Neutro Abs 5.1 1.7 - 7.7 K/uL   Lymphocytes Relative 22 %   Lymphs Abs 1.7 0.7 - 4.0 K/uL   Monocytes Relative 10 %   Monocytes Absolute 0.8 0.1 - 1.0 K/uL   Eosinophils Relative 2 %   Eosinophils Absolute 0.1 0.0 - 0.7 K/uL   Basophils Relative 0 %   Basophils Absolute 0.0 0.0 - 0.1 K/uL  Osmolality     Status: None   Collection Time: 11/29/16  8:41 PM  Result Value Ref Range   Osmolality 277 275 - 295 mOsm/kg  Basic metabolic panel     Status: Abnormal   Collection Time: 11/29/16  8:41 PM  Result Value Ref Range   Sodium 133 (L) 135 - 145 mmol/L    Comment: DELTA CHECK NOTED   Potassium 4.2 3.5 - 5.1 mmol/L   Chloride 98 (L) 101 - 111 mmol/L   CO2 23  22 - 32 mmol/L    Glucose, Bld 166 (H) 65 - 99 mg/dL   BUN 9 6 - 20 mg/dL   Creatinine, Ser 0.85 0.44 - 1.00 mg/dL   Calcium 9.7 8.9 - 10.3 mg/dL   GFR calc non Af Amer >60 >60 mL/min   GFR calc Af Amer >60 >60 mL/min    Comment: (NOTE) The eGFR has been calculated using the CKD EPI equation. This calculation has not been validated in all clinical situations. eGFR's persistently <60 mL/min signify possible Chronic Kidney Disease.    Anion gap 12 5 - 15  Glucose, capillary     Status: Abnormal   Collection Time: 11/29/16 11:13 PM  Result Value Ref Range   Glucose-Capillary 143 (H) 65 - 99 mg/dL  Basic metabolic panel     Status: Abnormal   Collection Time: 11/30/16  6:04 AM  Result Value Ref Range   Sodium 131 (L) 135 - 145 mmol/L   Potassium 4.5 3.5 - 5.1 mmol/L   Chloride 98 (L) 101 - 111 mmol/L   CO2 25 22 - 32 mmol/L   Glucose, Bld 148 (H) 65 - 99 mg/dL   BUN 11 6 - 20 mg/dL   Creatinine, Ser 0.86 0.44 - 1.00 mg/dL   Calcium 10.1 8.9 - 10.3 mg/dL   GFR calc non Af Amer >60 >60 mL/min   GFR calc Af Amer >60 >60 mL/min    Comment: (NOTE) The eGFR has been calculated using the CKD EPI equation. This calculation has not been validated in all clinical situations. eGFR's persistently <60 mL/min signify possible Chronic Kidney Disease.    Anion gap 8 5 - 15  CBC     Status: None   Collection Time: 11/30/16  6:04 AM  Result Value Ref Range   WBC 7.2 4.0 - 10.5 K/uL   RBC 4.53 3.87 - 5.11 MIL/uL   Hemoglobin 13.5 12.0 - 15.0 g/dL   HCT 39.8 36.0 - 46.0 %   MCV 87.9 78.0 - 100.0 fL   MCH 29.8 26.0 - 34.0 pg   MCHC 33.9 30.0 - 36.0 g/dL   RDW 14.8 11.5 - 15.5 %   Platelets 331 150 - 400 K/uL  Glucose, capillary     Status: Abnormal   Collection Time: 11/30/16  6:52 AM  Result Value Ref Range   Glucose-Capillary 146 (H) 65 - 99 mg/dL   Comment 1 Notify RN    Comment 2 Document in Chart     Current Facility-Administered Medications  Medication Dose Route Frequency Provider Last Rate  Last Dose  . amLODipine (NORVASC) tablet 10 mg  10 mg Oral Daily Jule Ser, DO      . atorvastatin (LIPITOR) tablet 40 mg  40 mg Oral QHS Jule Ser, DO   40 mg at 11/29/16 2258  . clopidogrel (PLAVIX) tablet 75 mg  75 mg Oral QPM Jule Ser, DO   75 mg at 11/29/16 2258  . enoxaparin (LOVENOX) injection 40 mg  40 mg Subcutaneous Q24H Jule Ser, DO      . feeding supplement (ENSURE ENLIVE) (ENSURE ENLIVE) liquid 237 mL  237 mL Oral BID BM Jule Ser, DO      . fluticasone [facility]) 50 MCG/ACT nasal spray 1 spray  1 spray Each Nare Daily PRN Jule Ser, DO      . insulin aspart (novoLOG) injection 0-5 Units  0-5 Units Subcutaneous QHS Jule Ser, DO      . insulin aspart (novoLOG) injection 0-9 Units  0-9 Units Subcutaneous TID WC Jule Ser, DO   1 Units at 11/30/16 6629  . iopamidol (ISOVUE-300) 61 % injection           . isosorbide mononitrate (IMDUR) 24 hr tablet 30 mg  30 mg Oral Daily Jule Ser, DO      . levothyroxine (SYNTHROID, LEVOTHROID) tablet 25 mcg  25 mcg Oral QAC breakfast Jule Ser, DO   25 mcg at 11/30/16 0741  . loratadine (CLARITIN) tablet 10 mg  10 mg Oral Daily PRN Jule Ser, DO      . metoprolol succinate (TOPROL-XL) 24 hr tablet 25 mg  25 mg Oral Daily Jule Ser, DO      . mometasone-formoterol (DULERA) 200-5 MCG/ACT inhaler 2 puff  2 puff Inhalation BID Jule Ser, DO   2 puff at 11/30/16 0757  . multivitamin with minerals tablet 1 tablet  1 tablet Oral Daily Jule Ser, DO      . pantoprazole (PROTONIX) EC tablet 40 mg  40 mg Oral Daily Jule Ser, DO      . polyethylene glycol (MIRALAX / GLYCOLAX) packet 17 g  17 g Oral Daily PRN Jule Ser, DO      . polyvinyl alcohol (LIQUIFILM TEARS) 1.4 % ophthalmic solution 1 drop  1 drop Both Eyes QHS Jule Ser, DO   1 drop at 11/29/16 2354  . ramelteon (ROZEREM) tablet 8 mg  8 mg Oral QHS Ledell Noss, MD   8 mg at 11/29/16 2353  . senna (SENOKOT) tablet 8.6 mg   1 tablet Oral QHS PRN Jule Ser, DO      . sodium chloride flush (NS) 0.9 % injection 3 mL  3 mL Intravenous Q12H Jule Ser, DO   3 mL at 11/29/16 2259    Musculoskeletal: Strength & Muscle Tone: within normal limits Gait & Station: unable to stand Patient leans: N/A  Psychiatric Specialty Exam: Physical Exam as per history and physical  ROS depression, anxiety, guilty, anger and lack of interest.  No Fever-chills, No Headache, No changes with Vision or hearing, reports vertigo No problems swallowing food or Liquids, No Chest pain, Cough or Shortness of Breath, No Abdominal pain, No Nausea or Vommitting, Bowel movements are regular, No Blood in stool or Urine, No dysuria, No new skin rashes or bruises, No new joints pains-aches,  No new weakness, tingling, numbness in any extremity, No recent weight gain or loss, No polyuria, polydypsia or polyphagia,   A full 10 point Review of Systems was done, except as stated above, all other Review of Systems were negative.  Blood pressure (!) 146/80, pulse 95, temperature 98 F (36.7 C), temperature source Oral, resp. rate 18, height _0  (1.626 m), weight 52.1 kg (114 lb 12.8 oz), last menstrual period 02/11/1975, SpO2 98 %.Body mass index is 19.71 kg/m.  General Appearance: Guarded sitting on her bed and trying to eat her meal from tray.  Eye Contact:  Good  Speech:  Clear and Coherent  Volume:  Decreased  Mood:  Anxious and Depressed  Affect:  Constricted and Depressed  Thought Process:  Coherent and Goal Directed  Orientation:  Full (Time, Place, and Person)  Thought Content:  WDL  Suicidal Thoughts:  No  Homicidal Thoughts:  No  Memory:  Immediate;   Fair Recent;   Fair Remote;   Fair  Judgement:  Fair  Insight:  Good  Psychomotor Activity:  Decreased  Concentration:  Concentration: Fair and Attention Span: Poor  Recall:  Fair  Fund of Knowledge:  Good  Language:  Good  Akathisia:  Negative  Handed:  Right   AIMS (if indicated):     Assets:  Communication Skills Desire for Improvement Financial Resources/Insurance Housing Leisure Time Resilience Social Support Transportation  ADL's:  Intact  Cognition:  WNL  Sleep:        Treatment Plan Summary: 83 years old female, lives with her sister and presented with increased symptoms of depression and anxiety since her medication imipramine discontinued due to hyponatremia few days ago.   She has no safety concerns. Will start Venlafaxine XR 37.5 mg daily for 3 days and than increase to 75 mg daily for depression Start Hydroxyzine 10 mg PO TID for anxiety  Agree with ativan prior to participating MRI scan of brain Daily contact with patient to assess and evaluate symptoms and progress in treatment and Medication management  Appreciate psychiatric consultation and we sign off as of today Please contact [phone] or [phone] if needs further assistance   Disposition: No evidence of imminent risk to self or others at present.   Supportive therapy provided about ongoing stressors.  Ambrose Finland, MD 11/30/2016 11:06 AM

## 2016-11-30 NOTE — Progress Notes (Signed)
Responded to nurse's page to see pt who was feeling guilty for her past unspecified sins. Pt is wonderfd 83 y. o woman of strong faith, apparently w/ great knowledge of scripture. She gripped my hand firmly while her granddaugthter held her other one. At first she seemed to be disburdening her soul of her need for God's mercy, confessing that many times in her life she has not attended enough to the things of above but the things of earth, had not taken enough care of her loved ones, and had done things, e.g., that made her blood pressure go up. She has always believed in God. But she does not appear to feel God's loving presence now -- which seems to frighten her.   After a time of listening presence, I prayed with her and her granddaughter, esp. that she would feel the mercy and love of God. While at first appearing to attend -- and able to respond especially to words/"signs" like love, mercy, Jesus, forgive -- save and to pray the Lord's prayer together partly in unison and partly in response -- it was clear that pt is more in her own world than attending to/participating w/ the prayer as others might. That is, others' words were not then reaching her in a routine conversational manner. She instead soon resumed a similar monologue w/ which she'd begun when I first took her hand and sat down by her bedside, persisting in seeming unable to forgive herself even when her daughter had  also returned to rm and joined [place], interjecting, e.g., Mom, you didn't offend [place], but we forgive you evein if you did  I asked her daughter if he mom had favorite hymns, as sometimes music can break through such barriers. So the daughter and I sang Leone Payor while the former also looked on her phone for other music she could use as she has at other times. Pt did seem to respond to some of the lyrics of [place].Then medical staff came in to speak to pt, and she was shortly taken to a test.  I encouraged the daughter to  let medical staff know about her mom's recurring state of mind -- and the granddaughter, who'd already texted pt's pastor to ask him to come visit again, if there were some ritual he could use in prayer w/ her, such as anointing or laying on of hands. Sometimesa ritual can open doors to reception of meaning, especially in people feel fear or shame.   The daughter said her mom had been depressed a long time, but hidden it -- tthat she had cause to be, as she'd suffered the extreme loss of 5 children and her husband. In addition, today is her sister's funeral, and pt feels guilty that she cannot be there. The daughter said her mom believed it was wrong to take medicine and not rely on only God for healing, so for some time she'd hidden from her family that she was taking medicine to fight depression --had just said it was to forestall headaches. Chaplain concurs that psych consult could be helpful. Available for follow-up.    11/30/16 1200  Clinical Encounter Type  Visited With Patient and family together;Health care provider  Visit Type Initial;Psychological support;Spiritual support;Social support  Referral From Nurse  Spiritual Encounters  Spiritual Needs Prayer;Emotional  Stress Factors  Patient Stress Factors Loss;Loss of control;Other (Comment) (Guilt over past sins)  Family Stress Factors Family relationships;Health changes;Loss;Loss of control   Gerrit Heck, Chaplain

## 2016-11-30 NOTE — Care Management Obs Status (Signed)
[facility] NOTIFICATION   Patient Details  Name: Christine [name] MRN: [id number] Date of Birth: 01/24/1934   Medicare Observation Status Notification Given:  Yes    Dellie Catholic, RN 11/30/2016, 2:52 PM

## 2016-11-30 NOTE — Progress Notes (Signed)
Internal Medicine Attending:   I saw and examined the patient. I reviewed the resident's note and I agree with the resident's findings and plan as documented in the resident's note.  See my H&P note for details. The patient has persistent dysarthria and an abnormal finger to nose test. Plan for MRI brain today to evaluate for posterior fossa CVA.

## 2016-11-30 NOTE — Progress Notes (Signed)
Subjective:   No acute events overnight.  Pt says that she has been having a lot of anxiety. Daughter reaffirmed it and aded that it is over small issues like what goes around the house- the pipes, etc. She also feels guilty over things she should not have done.  Daughter says that pt has been on imipramine for 30 years. At one point, she was tried a different agent but she started having headaches  So she was put back on imipramine .  On exam, she continues to have some dysarthria and we are concerned about a stroke so we are ordering a brain MRI.   At 11:18, we were paged that rapid response was called. A code or a code stroke was not called. We evaluated the pt at bedside. Daughter said that pt started drinking the contrast and then few minutes later felt wobbly, and then when she laid on the bed, her eyes rolled to the top. She was noted to be hypertensive in the low [id number] systolic and dysarthric. Patient did not have any LOC and did not hit her head.  A focal neuro exam revealed persistent dysarthria, more pronounced than this morning. Normal strength, normal FNF.  Given the change in the mental status, we ordered stat CT head. Contrast could also cause flushing. There was a discordant story in that nurse said she only drank few sips of contrast but daughter said she drank 1 bottle of contrast.  Objective:  Vital signs in last 24 hours: Vitals:   11/29/16 2023 11/29/16 2353 11/30/16 0420 11/30/16 0759  BP: (!) 196/91 (!) 210/81 (!) 175/75   Pulse: 86 94 (!) 101   Resp: 18  18   Temp: 97.5 F (36.4 C)  98 F (36.7 C)   TempSrc: Oral  Oral   SpO2: 100%   98%  Weight: 118 lb 8 oz (53.8 kg)  114 lb 12.8 oz (52.1 kg)   Height: 5\' 4"  (1.626 m)      General: Vital signs reviewed. Patient in no acute distress, sitting over edge of bed eating breakfast. Daughter at bedside  Cardiovascular: regular rate, rhythm, no murmur appreciated  Pulmonary/Chest: Clear to auscultation bilaterally, no  wheezes, rales, or rhonchi. Abdominal: Soft, non-tender, non-distended, BS + Extremities: No lower extremity edema bilaterally,  Skin: Warm, dry and intact.     Assessment/Plan:  Active Problems:   Hyponatremia  New onset dysarthria: abnormal neuro exam per admitting team. She had abnormal FNF, and on my exam, she had persistent dysarthria. Daughter says that She acts like this when she is anxious, however, a CVA needs to be ruled out. CT head was negative on 1/9. Later on we noticed more pronounced dysarthria at 11:18 AM  -ordered stat CT head  -ordered brain MRI WO contrast   HTN: SBP 200s on admission. Had dysarthria on admission. Some concern about PRES. BP of 204/78 at 11:30 AM  -amlodipine 10 mg -metoprolol 12.5 mg daily -Valsartan -PRN hydral for SBP > 180   Hyponatremia: likely due to tea and toast diet Imipramine can also cause SIADH. UNa and Uosm pending. On previous admission, Na corrected by fluid restriction. Yesterday received 500 cc of fluid in ER. Today Na of 141   -fluid restriction to  -holding imipramine. Consulted psyc for recs about switching antidepressants  Protein calorie malnutrition  -ordered ensure feeding supplements and MVM   Constipation and weight loss -follow up CT abdomen and pelvis  -ordered miralax and senna  Coronary artery disease : Left heart cath 09/2015 40-50% left main stenosis. -atorvastatin 40 mg qd,  -plavix 75 mg qd -IMDUR 30 mg qd  Non insulin dependent Type 2 diabetes: Last A1c 10/2016 6.6  - SSI-S with HS coverage and CBGs  Hypothyroid : TSH 1/9 2.1 - ordered home medication synthroid 25 mg qd   COPD  -ordered home meds dulera BID, loratadine, and fluticasone  Functional status: -ordered PT and OT eval   Dispo: Anticipated discharge in approximately 2 day(s).   Burgess Estelle, MD 11/30/2016, 10:29 AM

## 2016-11-30 NOTE — Progress Notes (Signed)
Pt left for MRI via bed and instructed pt's daughter that pt is ordered ativan 1mg  iv before going for MRI to help her to calm her down to have MRI done.  Pt's daughter at bedside stated that she does not need it, she is resting well.  Informed her that if pt start getting anxious have asked MRI to call me so pt can get ativan as ordered.  Also notified Dr. Danford Bad of above.  Karie Kirks, Therapist, sports.

## 2016-12-01 DIAGNOSIS — Z880 Allergy status to penicillin: Secondary | ICD-10-CM

## 2016-12-01 DIAGNOSIS — Z8489 Family history of other specified conditions: Secondary | ICD-10-CM

## 2016-12-01 DIAGNOSIS — E46 Unspecified protein-calorie malnutrition: Secondary | ICD-10-CM | POA: Diagnosis not present

## 2016-12-01 DIAGNOSIS — Z79899 Other long term (current) drug therapy: Secondary | ICD-10-CM

## 2016-12-01 DIAGNOSIS — R471 Dysarthria and anarthria: Secondary | ICD-10-CM | POA: Diagnosis present

## 2016-12-01 DIAGNOSIS — F419 Anxiety disorder, unspecified: Secondary | ICD-10-CM | POA: Diagnosis present

## 2016-12-01 DIAGNOSIS — E871 Hypo-osmolality and hyponatremia: Secondary | ICD-10-CM | POA: Diagnosis not present

## 2016-12-01 DIAGNOSIS — F332 Major depressive disorder, recurrent severe without psychotic features: Secondary | ICD-10-CM | POA: Diagnosis not present

## 2016-12-01 DIAGNOSIS — E119 Type 2 diabetes mellitus without complications: Secondary | ICD-10-CM | POA: Diagnosis not present

## 2016-12-01 DIAGNOSIS — Z9889 Other specified postprocedural states: Secondary | ICD-10-CM

## 2016-12-01 DIAGNOSIS — Z7951 Long term (current) use of inhaled steroids: Secondary | ICD-10-CM | POA: Diagnosis not present

## 2016-12-01 DIAGNOSIS — I129 Hypertensive chronic kidney disease with stage 1 through stage 4 chronic kidney disease, or unspecified chronic kidney disease: Secondary | ICD-10-CM | POA: Diagnosis present

## 2016-12-01 DIAGNOSIS — I10 Essential (primary) hypertension: Secondary | ICD-10-CM | POA: Diagnosis not present

## 2016-12-01 DIAGNOSIS — Z90710 Acquired absence of both cervix and uterus: Secondary | ICD-10-CM

## 2016-12-01 DIAGNOSIS — Z681 Body mass index (BMI) 19 or less, adult: Secondary | ICD-10-CM | POA: Diagnosis not present

## 2016-12-01 DIAGNOSIS — Z8249 Family history of ischemic heart disease and other diseases of the circulatory system: Secondary | ICD-10-CM

## 2016-12-01 DIAGNOSIS — E785 Hyperlipidemia, unspecified: Secondary | ICD-10-CM | POA: Diagnosis present

## 2016-12-01 DIAGNOSIS — E039 Hypothyroidism, unspecified: Secondary | ICD-10-CM | POA: Diagnosis not present

## 2016-12-01 DIAGNOSIS — Z794 Long term (current) use of insulin: Secondary | ICD-10-CM

## 2016-12-01 DIAGNOSIS — K219 Gastro-esophageal reflux disease without esophagitis: Secondary | ICD-10-CM | POA: Diagnosis present

## 2016-12-01 DIAGNOSIS — Z7902 Long term (current) use of antithrombotics/antiplatelets: Secondary | ICD-10-CM | POA: Diagnosis not present

## 2016-12-01 DIAGNOSIS — Z888 Allergy status to other drugs, medicaments and biological substances status: Secondary | ICD-10-CM

## 2016-12-01 DIAGNOSIS — E1122 Type 2 diabetes mellitus with diabetic chronic kidney disease: Secondary | ICD-10-CM | POA: Diagnosis present

## 2016-12-01 DIAGNOSIS — Z7984 Long term (current) use of oral hypoglycemic drugs: Secondary | ICD-10-CM | POA: Diagnosis not present

## 2016-12-01 DIAGNOSIS — J449 Chronic obstructive pulmonary disease, unspecified: Secondary | ICD-10-CM | POA: Diagnosis not present

## 2016-12-01 DIAGNOSIS — I160 Hypertensive urgency: Secondary | ICD-10-CM | POA: Diagnosis present

## 2016-12-01 DIAGNOSIS — E43 Unspecified severe protein-calorie malnutrition: Secondary | ICD-10-CM | POA: Diagnosis present

## 2016-12-01 DIAGNOSIS — N183 Chronic kidney disease, stage 3 (moderate): Secondary | ICD-10-CM | POA: Diagnosis present

## 2016-12-01 DIAGNOSIS — I2510 Atherosclerotic heart disease of native coronary artery without angina pectoris: Secondary | ICD-10-CM | POA: Diagnosis not present

## 2016-12-01 DIAGNOSIS — K5900 Constipation, unspecified: Secondary | ICD-10-CM | POA: Diagnosis not present

## 2016-12-01 LAB — GLUCOSE, CAPILLARY
Glucose-Capillary: 146 mg/dL — ABNORMAL HIGH (ref 65–99)
Glucose-Capillary: 193 mg/dL — ABNORMAL HIGH (ref 65–99)
Glucose-Capillary: 167 mg/dL — ABNORMAL HIGH (ref 65–99)
Glucose-Capillary: 176 mg/dL — ABNORMAL HIGH (ref 65–99)

## 2016-12-01 LAB — BASIC METABOLIC PANEL
Anion gap: 8 (ref 5–15)
Anion gap: 10 (ref 5–15)
BUN: 18 mg/dL (ref 6–20)
BUN: 22 mg/dL — AB (ref 6–20)
CALCIUM: 9.7 mg/dL (ref 8.9–10.3)
CO2: 24 mmol/L (ref 22–32)
CO2: 22 mmol/L (ref 22–32)
CREATININE: 0.94 mg/dL (ref 0.44–1.00)
CREATININE: 0.89 mg/dL (ref 0.44–1.00)
Calcium: 9.6 mg/dL (ref 8.9–10.3)
Chloride: 97 mmol/L — ABNORMAL LOW (ref 101–111)
Chloride: 96 mmol/L — ABNORMAL LOW (ref 101–111)
GFR calc Af Amer: >60 mL/min (ref >60)
GFR calc Af Amer: >60 mL/min (ref >60)
GFR calc non Af Amer: 60 mL/min — ABNORMAL LOW (ref >60)
GFR, EST NON AFRICAN AMERICAN: 56 mL/min — AB (ref >60)
GLUCOSE: 116 mg/dL — AB (ref 65–99)
GLUCOSE: 224 mg/dL — AB (ref 65–99)
Potassium: 4.3 mmol/L (ref 3.5–5.1)
Potassium: 4.4 mmol/L (ref 3.5–5.1)
SODIUM: 129 mmol/L — AB (ref 135–145)
Sodium: 128 mmol/L — ABNORMAL LOW (ref 135–145)

## 2016-12-01 MED ORDER — INSULIN ASPART 100 UNIT/ML ~~LOC~~ SOLN
0.0000 [IU] | Freq: Three times a day (TID) | SUBCUTANEOUS | Status: DC
  Administered 2016-12-01 – 2016-12-02 (×4): 3 [IU] via SUBCUTANEOUS
  Administered 2016-12-02: 15 [IU] via SUBCUTANEOUS

## 2016-12-01 MED ORDER — VENLAFAXINE HCL ER 75 MG PO CP24
75.0000 mg | ORAL_CAPSULE | Freq: Every day | ORAL | Status: DC
  Administered 2016-12-02: 75 mg via ORAL
  Filled 2016-12-01 (×2): qty 1

## 2016-12-01 MED ORDER — SODIUM CHLORIDE 1 G PO TABS
1.0000 g | ORAL_TABLET | Freq: Two times a day (BID) | ORAL | Status: DC
  Administered 2016-12-01 – 2016-12-02 (×3): 1 g via ORAL
  Filled 2016-12-01 (×4): qty 1

## 2016-12-01 MED ORDER — METOPROLOL SUCCINATE ER 50 MG PO TB24
50.0000 mg | ORAL_TABLET | Freq: Every day | ORAL | Status: DC
  Administered 2016-12-02: 50 mg via ORAL
  Filled 2016-12-01: qty 1

## 2016-12-01 MED ORDER — ENSURE ENLIVE PO LIQD
237.0000 mL | Freq: Two times a day (BID) | ORAL | Status: DC
  Administered 2016-12-01: 237 mL via ORAL

## 2016-12-01 NOTE — Progress Notes (Signed)
Paged provider about Patient's BP 182/62. No new orders received. Ordered to watch patient and recheck BP in an hour. Patient is currently asymptomatic and in bed resting. Family at bedside. Will continue to monitor

## 2016-12-01 NOTE — Progress Notes (Signed)
Internal Medicine Attending:   I saw and examined the patient. I reviewed the resident's note and I agree with the resident's findings and plan as documented in the resident's note.  83 -year-old woman readmitted for persistent dysarthria at home and found to have severe hypertension and hyponatremia. We ruled out a posterior CVA with MRI the brain yesterday. On exam today she appears well, sitting up in bed reading scripture with her son. Still having some amount of dysarthria. Still perseverating and hyper-religious. Greatly appreciate psychiatry consultation yesterday. Antidepressives and anxiety medications were adjusted which will hopefully respond better to her acute grief. She might still be having some symptoms of hyponatremia. We spent a lot of time talking about free water restriction, and increasing solute intake. I think adding salt tabs will help, it will allow her to take more free water before running into trouble. We will have to watch her response to starting venlafaxine closely. Plan to check BMP every 12 hours. Free water restrict to < 1200 ml today, ensure is ok, increase protein and limit carbs.

## 2016-12-01 NOTE — Progress Notes (Signed)
OT Cancellation Note  Patient Details Name: Christine [name] MRN: [id number] DOB: 01/24/1934   Cancelled Treatment:    Reason Eval/Treat Not Completed: OT screened, no needs identified, will sign off. Per PT, pt supervision overall for mobility with no acute OT needs identified. Will screen and sign off at this time. Please re-consult if needs change. Thank you for this referral.  Binnie Kand M.S., OTR/L Pager: [phone]  12/01/2016, 4:59 PM

## 2016-12-01 NOTE — Consult Note (Signed)
[facility] Consult   Reason for Consult:  Depression and anxiety Referring Physician:  Dr. Evette Doffing Patient Identification: Christine [name] MRN:  [id number] Principal Diagnosis: MDD (major depressive disorder), recurrent severe, without psychosis ([place]) Diagnosis:   Patient Active Problem List   Diagnosis Date Noted  . Dysarthria [R47.1] 12/01/2016  . MDD (major depressive disorder), recurrent severe, without psychosis ([place]) [F33.2] 11/30/2016  . Protein-calorie malnutrition, severe [E43] 11/30/2016  . Hypertensive urgency [I16.0] 11/27/2016  . Malnutrition of moderate degree [E44.0] 11/27/2016  . Altered mental status [R41.82]   . Appetite loss [R63.0]   . Constipation [K59.00]   . Hyponatremia [E87.1] 11/26/2016  . Elevated alkaline phosphatase level [R74.8] 09/18/2016  . Mixed incontinence urge and stress [N39.46] 05/09/2016  . Dizziness [R42] 04/11/2016  . Exertional dyspnea [R06.09] 09/11/2015  . Hypertensive retinopathy of both eyes, grade 2 [H35.033] 04/20/2015  . Lumbar spondylosis [M47.816] 06/16/2014  . Carpal tunnel syndrome [G56.00] 02/18/2014  . Right shoulder pain [M25.511] 02/02/2014  . Osteopenia [M85.80] 01/11/2013  . Routine adult health maintenance [Z00.00] 01/11/2013  . Chronic renal insufficiency, stage III (moderate) [N18.3] 09/21/2012  . Recurrent cough [R05] 09/18/2012  . Diverticulosis [K57.90] 09/18/2012  . Allergic rhinitis [J30.9] 02/13/2012  . Carotid stenosis [I65.29] 09/25/2011  . Coronary artery disease [I25.10] 08/29/2011  . HEART MURMUR, SYSTOLIC [G99.2] 42/68/3419  . Enlargement of clavicle [M89.319] 03/30/2008  . Solitary pulmonary nodule [R91.1] 12/15/2006  . Hyperlipidemia [E78.5] 11/28/2006  . Hypothyroidism [E03.9] 09/04/2006  . Type 2 diabetes mellitus, controlled ([place]) [E11.9] 09/04/2006  . Migraine [G43.909] 09/04/2006  . Essential hypertension [I10] 09/04/2006  . GERD [K21.9] 09/04/2006    Total Time spent with  patient: 1 hour  Subjective:   Christine [name] is a 83 y.o. female patient admitted with Hyponatremia and hypertension.  HPI:  Christine [name] is a 83 y.o. female with a PMH of HTN, HLD, hypothyroidism, CAD, DM2, COPD, and CKD who presents with stuttering speech and not acting her self. She was recently hospitalized 1/9 -11/28/2016 for changes in mental status and confusion and found to be hyponatremic with hypertensive urgency.   Patient seen, chart reviewed and case discussed with her daughter and grand daughter who are at bed side. Patient endorses "very depressed", increased psychomotor activity, loss of interest, not able to participate in her regular activities like reading and watching television etc. She also endorses disturbed sleep and appetite. She was found with altered mental status and confusion during first hospitalization and this time she has stuttering speech and elevated blood pressure. She also endorses of having excessive anxiety, anger, psychomotor agitation and does not like her sister daughter moving into her home which is additional stress to her. She feels guilty about being hospitalized when she suppose to go the her sister's funeral etc. Patient stated that my medication. "Imipramine was discontinued due to hyponatremia. I am not doing well even with the medication". She denied suicide or homicide ideations, intention or plans. She has no evidence of psychosis. Her sodium level is trending up from 83 yo 131 since admitted. Patient has order of ativan for MRI scan today as per staff RN.   Past Psychiatric History: Depression and anxiety and was treated over the years by PCP.  12/01/2016 Interval history: Patient seen for the psychiatric consultation follow up. Patient appeared sitting in her bed, complained of feeling guilty about not participating in religious activities. She has tolerated her medication hydroxyzine and Venlafaxine without adverse effects. Discussed with patient  and  family that will be increased to 75 mg tomorrow and agree with it. She has no acute pathology as per brain MRI which was reviewed during this evaluation. She has mild hyponatremia and agree regarding limiting excessive oral fluid intake.    Risk to Self: Is patient at risk for suicide?: No Risk to Others:   Prior Inpatient Therapy:   Prior Outpatient Therapy:    Past Medical History:  Past Medical History:  Diagnosis Date  . Anemia   . Anginal pain ([place])   . Arthritis    "back, arms, hips; hands" (11/30/2015)  . Benign hypertensive heart and kidney disease with diastolic CHF, NYHA class II and CKD stage III ([place])   . Blood transfusion 1972   "after daughter born, attempted to give me blood; couldn't give it cause my blood was cold" (09/23/2013)  . CAD (coronary artery disease)    a. LHC 08/23/11: dLM 40-50%, oRI 40%, oCFX 40%, oD1 70% (small and not amenable to PCI).  LM lesion did not appear to be flow limiting.  Medical rx was recommended;  b. Echo 08/23/11: mild LVH, EF 60-65%, grade 1 diast dysfxn, mild BAE, PASP 24;  c. 02/2012  Cath: LM 50d, LAD 50p, D1 70ost, RI 70p, RCA ok->Med Rx;  d. 01/2013 Cardiolite: EF 88, no ischemia/infarct.  . Carotid stenosis    a. dopplers 10/12:  0-39% bilat ICA;  b. 10/2012 U/S: 0-39% bilat, f/u 1 yr (10/2013).  . Chronic bronchitis   . Depression   . Diverticulosis of colon (without mention of hemorrhage)   . GERD (gastroesophageal reflux disease)   . Heart murmur, systolic    2-D echo in December 2011 showed a normal EF with grade 1 diastolic dysfunction, trivial pulmonary regurgitation and mildly elevated PA pressure at 37 mmHg probably secondary to her COPD.dynamic obstruction-mid cavity obliteration;  b. 02/2012 Echo: EF 60-65%, mild LVH, PASP 91mHg.  .Marland KitchenHistory of stomach ulcers 1970's  . Hyperlipidemia   . Hypertension   . Hypothyroidism   . Migraines    a. Next atypical symptoms in the past. Patient was started on Neurontin for possible  neuropathic origin of her pain.  . [place]a [place]   . Shortness of breath on exertion    "just related to angina >1 yr ago" (09/23/2013)  . Sinus arrhythmia   . Type II diabetes mellitus ([place]    a. On oral hypoglycemic agents.    Past Surgical History:  Procedure Laterality Date  . CARDIAC CATHETERIZATION N/A 09/26/2015   Procedure: Right/Left Heart Cath and Coronary Angiography;  Surgeon: DLarey Dresser MD;  [facility];  Service: Cardiovascular;  Laterality: N/A;  . CATARACT EXTRACTION W/ INTRAOCULAR LENS  IMPLANT, BILATERAL Bilateral 1990's  . LEFT HEART CATHETERIZATION WITH CORONARY ANGIOGRAM N/A 03/16/2012   Procedure: LEFT HEART CATHETERIZATION WITH CORONARY ANGIOGRAM;  Surgeon: THillary Bow MD;  [facility];  Service: Cardiovascular;  Laterality: N/A;  . VAGINAL HYSTERECTOMY  1976   Family History:  Family History  Problem Relation Age of Onset  . Colon cancer Maternal Grandmother   . Heart attack Maternal Grandmother   . COPD Father   . Other Mother     died of unknown causes in her 228's Pt raised by grandmother.  . Pulmonary Hypertension Daughter    Family Psychiatric  History: non contributory Social History:  History  Alcohol Use No    Comment: 11/29/2016 "Last drink 1967"     History  Drug  Use No    Social History   Social History  . Marital status: Widowed    Spouse name: N/A  . Number of children: N/A  . Years of education: N/A   Social History Main Topics  . Smoking status: Never Smoker  . Smokeless tobacco: Never Used  . Alcohol use No     Comment: 11/29/2016 "Last drink 1967"  . Drug use: No  . Sexual activity: Not Currently   Other Topics Concern  . None   Social History Narrative   Never smoked. Lives in [place] with her dtr.  Care for her daughter who has had a lung transplant. Retired Building surveyor.   Additional Social History:    Allergies:   Allergies  Allergen Reactions  . Aspirin  Swelling and Other (See Comments)    Angioedema  . Nsaids Other (See Comments)    "interacts with heart medications"  . Codeine Nausea And Vomiting and Other (See Comments)    "makes me deathly sick" - can take with nausea medicine   . Other Other (See Comments)    MSG  . Penicillins Diarrhea    "real bad" Has patient had a PCN reaction causing immediate rash, facial/tongue/throat swelling, SOB or lightheadedness with hypotension: Yes Has patient had a PCN reaction causing severe rash involving mucus membranes or skin necrosis: No Has patient had a PCN reaction that required hospitalization No Has patient had a PCN reaction occurring within the last 10 years: Yes If all of the above answers are "NO", then may proceed with Cephalosporin use.     Labs:  Results for orders placed or performed during the hospital encounter of 11/29/16 (from the past 48 hour(s))  Basic metabolic panel     Status: Abnormal   Collection Time: 11/29/16  2:10 PM  Result Value Ref Range   Sodium 126 (L) 135 - 145 mmol/L   Potassium 4.5 3.5 - 5.1 mmol/L   Chloride 94 (L) 101 - 111 mmol/L   CO2 21 (L) 22 - 32 mmol/L   Glucose, Bld 185 (H) 65 - 99 mg/dL   BUN 12 6 - 20 mg/dL   Creatinine, Ser 0.79 0.44 - 1.00 mg/dL   Calcium 9.6 8.9 - 10.3 mg/dL   GFR calc non Af Amer >60 >60 mL/min   GFR calc Af Amer >60 >60 mL/min    Comment: (NOTE) The eGFR has been calculated using the CKD EPI equation. This calculation has not been validated in all clinical situations. eGFR's persistently <60 mL/min signify possible Chronic Kidney Disease.    Anion gap 11 5 - 15  CBC with Differential     Status: Abnormal   Collection Time: 11/29/16  2:10 PM  Result Value Ref Range   WBC 7.7 4.0 - 10.5 K/uL   RBC 4.03 3.87 - 5.11 MIL/uL   Hemoglobin 12.1 12.0 - 15.0 g/dL   HCT 35.1 (L) 36.0 - 46.0 %   MCV 87.1 78.0 - 100.0 fL   MCH 30.0 26.0 - 34.0 pg   MCHC 34.5 30.0 - 36.0 g/dL   RDW 14.3 11.5 - 15.5 %   Platelets 333 150  - 400 K/uL   Neutrophils Relative % 66 %   Neutro Abs 5.1 1.7 - 7.7 K/uL   Lymphocytes Relative 22 %   Lymphs Abs 1.7 0.7 - 4.0 K/uL   Monocytes Relative 10 %   Monocytes Absolute 0.8 0.1 - 1.0 K/uL   Eosinophils Relative 2 %   Eosinophils Absolute 0.1  0.0 - 0.7 K/uL   Basophils Relative 0 %   Basophils Absolute 0.0 0.0 - 0.1 K/uL  Sodium, urine, random     Status: None   Collection Time: 11/29/16  4:49 PM  Result Value Ref Range   Sodium, Ur 66 mmol/L  Osmolality, urine     Status: None   Collection Time: 11/29/16  4:50 PM  Result Value Ref Range   Osmolality, Ur 413 300 - 900 mOsm/kg  Osmolality     Status: None   Collection Time: 11/29/16  8:41 PM  Result Value Ref Range   Osmolality 277 275 - 295 mOsm/kg  Basic metabolic panel     Status: Abnormal   Collection Time: 11/29/16  8:41 PM  Result Value Ref Range   Sodium 133 (L) 135 - 145 mmol/L    Comment: DELTA CHECK NOTED   Potassium 4.2 3.5 - 5.1 mmol/L   Chloride 98 (L) 101 - 111 mmol/L   CO2 23 22 - 32 mmol/L   Glucose, Bld 166 (H) 65 - 99 mg/dL   BUN 9 6 - 20 mg/dL   Creatinine, Ser 0.85 0.44 - 1.00 mg/dL   Calcium 9.7 8.9 - 10.3 mg/dL   GFR calc non Af Amer >60 >60 mL/min   GFR calc Af Amer >60 >60 mL/min    Comment: (NOTE) The eGFR has been calculated using the CKD EPI equation. This calculation has not been validated in all clinical situations. eGFR's persistently <60 mL/min signify possible Chronic Kidney Disease.    Anion gap 12 5 - 15  Glucose, capillary     Status: Abnormal   Collection Time: 11/29/16 11:13 PM  Result Value Ref Range   Glucose-Capillary 143 (H) 65 - 99 mg/dL  Basic metabolic panel     Status: Abnormal   Collection Time: 11/30/16  6:04 AM  Result Value Ref Range   Sodium 131 (L) 135 - 145 mmol/L   Potassium 4.5 3.5 - 5.1 mmol/L   Chloride 98 (L) 101 - 111 mmol/L   CO2 25 22 - 32 mmol/L   Glucose, Bld 148 (H) 65 - 99 mg/dL   BUN 11 6 - 20 mg/dL   Creatinine, Ser 0.86 0.44 - 1.00  mg/dL   Calcium 10.1 8.9 - 10.3 mg/dL   GFR calc non Af Amer >60 >60 mL/min   GFR calc Af Amer >60 >60 mL/min    Comment: (NOTE) The eGFR has been calculated using the CKD EPI equation. This calculation has not been validated in all clinical situations. eGFR's persistently <60 mL/min signify possible Chronic Kidney Disease.    Anion gap 8 5 - 15  CBC     Status: None   Collection Time: 11/30/16  6:04 AM  Result Value Ref Range   WBC 7.2 4.0 - 10.5 K/uL   RBC 4.53 3.87 - 5.11 MIL/uL   Hemoglobin 13.5 12.0 - 15.0 g/dL   HCT 39.8 36.0 - 46.0 %   MCV 87.9 78.0 - 100.0 fL   MCH 29.8 26.0 - 34.0 pg   MCHC 33.9 30.0 - 36.0 g/dL   RDW 14.8 11.5 - 15.5 %   Platelets 331 150 - 400 K/uL  Glucose, capillary     Status: Abnormal   Collection Time: 11/30/16  6:52 AM  Result Value Ref Range   Glucose-Capillary 146 (H) 65 - 99 mg/dL   Comment 1 Notify RN    Comment 2 Document in Chart   Glucose, capillary  Status: Abnormal   Collection Time: 11/30/16 11:11 AM  Result Value Ref Range   Glucose-Capillary 273 (H) 65 - 99 mg/dL   Comment 1 Notify RN   Glucose, capillary     Status: Abnormal   Collection Time: 11/30/16  4:40 PM  Result Value Ref Range   Glucose-Capillary 152 (H) 65 - 99 mg/dL   Comment 1 Notify RN   Glucose, capillary     Status: Abnormal   Collection Time: 11/30/16  8:41 PM  Result Value Ref Range   Glucose-Capillary 218 (H) 65 - 99 mg/dL   Comment 1 Notify RN    Comment 2 Document in Chart   Basic metabolic panel     Status: Abnormal   Collection Time: 12/01/16  4:30 AM  Result Value Ref Range   Sodium 129 (L) 135 - 145 mmol/L   Potassium 4.3 3.5 - 5.1 mmol/L   Chloride 97 (L) 101 - 111 mmol/L   CO2 24 22 - 32 mmol/L   Glucose, Bld 116 (H) 65 - 99 mg/dL   BUN 18 6 - 20 mg/dL   Creatinine, Ser 0.94 0.44 - 1.00 mg/dL   Calcium 9.7 8.9 - 10.3 mg/dL   GFR calc non Af Amer 56 (L) >60 mL/min   GFR calc Af Amer >60 >60 mL/min    Comment: (NOTE) The eGFR has been  calculated using the CKD EPI equation. This calculation has not been validated in all clinical situations. eGFR's persistently <60 mL/min signify possible Chronic Kidney Disease.    Anion gap 8 5 - 15  Glucose, capillary     Status: Abnormal   Collection Time: 12/01/16  6:43 AM  Result Value Ref Range   Glucose-Capillary 146 (H) 65 - 99 mg/dL  Glucose, capillary     Status: Abnormal   Collection Time: 12/01/16 11:34 AM  Result Value Ref Range   Glucose-Capillary 193 (H) 65 - 99 mg/dL   Comment 1 Notify RN     Current Facility-Administered Medications  Medication Dose Route Frequency Provider Last Rate Last Dose  . amLODipine (NORVASC) tablet 10 mg  10 mg Oral Daily Jule Ser, DO   10 mg at 12/01/16 1103  . atorvastatin (LIPITOR) tablet 40 mg  40 mg Oral QHS Jule Ser, DO   40 mg at 11/30/16 2225  . clopidogrel (PLAVIX) tablet 75 mg  75 mg Oral QPM Jule Ser, DO   75 mg at 11/30/16 1800  . enoxaparin (LOVENOX) injection 40 mg  40 mg Subcutaneous Q24H Jule Ser, DO   40 mg at 12/01/16 1101  . feeding supplement (ENSURE ENLIVE) (ENSURE ENLIVE) liquid 237 mL  237 mL Oral TID BM Axel Filler, MD   237 mL at 12/01/16 1103  . feeding supplement (ENSURE ENLIVE) (ENSURE ENLIVE) liquid 237 mL  237 mL Oral BID BM Burgess Estelle, MD   237 mL at 12/01/16 1104  . fluticasone (FLONASE) 50 MCG/ACT nasal spray 1 spray  1 spray Each Nare Daily PRN Jule Ser, DO      . hydrOXYzine (ATARAX/VISTARIL) tablet 10 mg  10 mg Oral TID Ambrose Finland, MD   10 mg at 12/01/16 1103  . insulin aspart (novoLOG) injection 0-15 Units  0-15 Units Subcutaneous TID WC Burgess Estelle, MD   3 Units at 12/01/16 1222  . irbesartan (AVAPRO) tablet 150 mg  150 mg Oral Daily Burgess Estelle, MD   150 mg at 12/01/16 1102  . isosorbide mononitrate (IMDUR) 24 hr tablet 30 mg  30 mg Oral Daily Jule Ser, DO   30 mg at 12/01/16 1103  . levothyroxine (SYNTHROID, LEVOTHROID) tablet 25 mcg  25  mcg Oral QAC breakfast Jule Ser, DO   25 mcg at 12/01/16 [phone]  . loratadine (CLARITIN) tablet 10 mg  10 mg Oral Daily PRN Jule Ser, DO      . LORazepam (ATIVAN) injection 1 mg  1 mg Intravenous Once Burgess Estelle, MD      . Derrill Memo ON 12/02/2016] metoprolol succinate (TOPROL-XL) 24 hr tablet 50 mg  50 mg Oral Daily Burgess Estelle, MD      . mometasone-formoterol [facility]) 200-5 MCG/ACT inhaler 2 puff  2 puff Inhalation BID Jule Ser, DO   2 puff at 11/30/16 2031  . multivitamin with minerals tablet 1 tablet  1 tablet Oral Daily Jule Ser, DO   1 tablet at 12/01/16 1103  . ondansetron (ZOFRAN) injection 4 mg  4 mg Intravenous Q8H PRN Bethany Molt, DO      . pantoprazole (PROTONIX) EC tablet 40 mg  40 mg Oral Daily Jule Ser, DO   40 mg at 12/01/16 1103  . polyethylene glycol (MIRALAX / GLYCOLAX) packet 17 g  17 g Oral Daily PRN Jule Ser, DO      . polyvinyl alcohol (LIQUIFILM TEARS) 1.4 % ophthalmic solution 1 drop  1 drop Both Eyes QHS Jule Ser, DO   1 drop at 11/30/16 2225  . ramelteon (ROZEREM) tablet 8 mg  8 mg Oral QHS Ledell Noss, MD   8 mg at 11/30/16 2227  . senna (SENOKOT) tablet 8.6 mg  1 tablet Oral QHS PRN Jule Ser, DO      . sodium chloride flush (NS) 0.9 % injection 3 mL  3 mL Intravenous Q12H Jule Ser, DO   3 mL at 12/01/16 1106  . sodium chloride tablet 1 g  1 g Oral BID WC Burgess Estelle, MD      . Derrill Memo ON 12/02/2016] venlafaxine XR (EFFEXOR-XR) 24 hr capsule 75 mg  75 mg Oral Q breakfast Ambrose Finland, MD        Musculoskeletal: Strength & Muscle Tone: within normal limits Gait & Station: unable to stand Patient leans: N/A  Psychiatric Specialty Exam: Physical Exam as per history and physical  ROS depression, anxiety, guilty, anger and lack of interest.  No Fever-chills, No Headache, No changes with Vision or hearing, reports vertigo No problems swallowing food or Liquids, No Chest pain, Cough or Shortness of  Breath, No Abdominal pain, No Nausea or Vommitting, Bowel movements are regular, No Blood in stool or Urine, No dysuria, No new skin rashes or bruises, No new joints pains-aches,  No new weakness, tingling, numbness in any extremity, No recent weight gain or loss, No polyuria, polydypsia or polyphagia,   A full 10 point Review of Systems was done, except as stated above, all other Review of Systems were negative.  Blood pressure (!) 184/84, pulse 92, temperature 98.2 F (36.8 C), temperature source Oral, resp. rate 20, height _0  (1.626 m), weight 52.5 kg (115 lb 11.2 oz), last menstrual period 02/11/1975, SpO2 100 %.Body mass index is 19.86 kg/m.  General Appearance: Guarded sitting on her bed and trying to eat her meal from tray.  Eye Contact:  Good  Speech:  Clear and Coherent  Volume:  Decreased  Mood:  Anxious and Depressed  Affect:  Constricted and Depressed  Thought Process:  Coherent and Goal Directed  Orientation:  Full (Time, Place, and Person)  Thought Content:  WDL  Suicidal Thoughts:  No  Homicidal Thoughts:  No  Memory:  Immediate;   Fair Recent;   Fair Remote;   Fair  Judgement:  Fair  Insight:  Good  Psychomotor Activity:  Decreased  Concentration:  Concentration: Fair and Attention Span: Poor  Recall:  [place] of Knowledge:  Good  Language:  Good  Akathisia:  Negative  Handed:  Right  AIMS (if indicated):     Assets:  Communication Skills Desire for Improvement Financial Resources/Insurance Housing Leisure Time Resilience Social Support Transportation  ADL's:  Intact  Cognition:  WNL  Sleep:        Treatment Plan Summary: 83 years old female, lives with her sister and presented with increased symptoms of depression and anxiety since her medication imipramine discontinued due to hyponatremia few days ago.   MRI scan of brain reveals no evidence for acute stroke in the brain, specifically in the posterior fossa and reports mild atrophy and  small vessel disease Will Increase Venlafaxine XR 75 mg daily for depression as she is able to tolerate her lower dose without adverse effects and continue to be depressed and guilty about not participating in religious activities etc.  Continue Hydroxyzine 10 mg PO TID for anxiety  Agree with ativan prior to participating MRI scan of brain Daily contact with patient to assess and evaluate symptoms and progress in treatment and Medication management  Appreciate psychiatric consultation and we sign off as of today Please contact [phone] or [phone] if needs further assistance   Disposition: No evidence of imminent risk to self or others at present.   Supportive therapy provided about ongoing stressors.  Ambrose Finland, MD 12/01/2016 12:43 PM

## 2016-12-01 NOTE — Progress Notes (Signed)
   Subjective:   No acute events overnight. Pt had MRI yesterday which was terminated earlier but did not show any CVA in posterior fossa. CThead, and abd/pelvis unremarkable   Today, she continues to persevere on words,  And son present at bedside. She continues to drink free water- so we have asked her to fluid restrict to 2 L.  She is also fixated on bible and hyperreligiosity- I wonder if there is other psychiatric component going on.  She has been started on venlafaxine and hydroxyzine.  Objective:  Vital signs in last 24 hours: Vitals:   11/30/16 1934 11/30/16 2031 11/30/16 2044 12/01/16 0432  BP: (!) 144/59  (!) 166/76 (!) 188/90  Pulse: 80  88 91  Resp:   20 20  Temp:   98.2 F (36.8 C) 98.6 F (37 C)  TempSrc:   Oral Oral  SpO2:  100% 100% 100%  Weight:    115 lb 11.2 oz (52.5 kg)  Height:       General: Vital signs reviewed. Patient in no acute distress, sitting over edge of bed eating breakfast holding bible. Son at bedside  Cardiovascular: regular rate, rhythm, no murmur appreciated  Pulmonary/Chest: Clear to auscultation bilaterally, no wheezes, rales, or rhonchi. Abdominal: Soft, non-tender, non-distended, BS + Extremities: No lower extremity edema bilaterally,  Skin: Warm, dry and intact.     Assessment/Plan:  Principal Problem:   MDD (major depressive disorder), recurrent severe, without psychosis ([place]) Active Problems:   Hyponatremia   Protein-calorie malnutrition, severe  New onset dysarthria: abnormal neuro exam per admitting team. She had abnormal FNF, and on exam, she had persistent dysarthria. Daughter says that She acts like this when she is anxious, however, a CVA needs to be ruled out. CT head was negative on 1/9. Later on we noticed more pronounced dysarthria at 11:18 AM 1/14. CT head, MRI brain were unremarkable. Likely this is anxiety related.  -Psychiatry started her on venlafaxine and hydroxyzine for anxiety.    HTN: SBP 200s on  admission. Had dysarthria on admission. CVA ruled out. Persistently hypertensive  -amlodipine 10 mg -metoprolol 25 mg daily--> increased to 50 mg daily -Valsartan -PRN hydral for SBP > 180   Hyponatremia: likely due to tea and toast diet Imipramine can also cause SIADH. Na is 129 today- Pt has been drinking a lot of free water without other solute intake.  -fluid restriction to 2L -salt tabs  -encouraged increased solute intake -consulted nutrition  Protein calorie malnutrition  -ordered ensure feeding supplements and MVM   Constipation and weight loss: CT abd pelvis neg. Likely depression related -ordered miralax and senna     Non insulin dependent Type 2 diabetes: Last A1c 10/2016 6.6  - SSI-M with HS coverage and CBGs  Hypothyroid : TSH 1/9 2.1 - ordered home medication synthroid 25 mg qd   COPD  -ordered home meds dulera BID, loratadine, and fluticasone  Functional status: -ordered PT and OT eval   Dispo: Anticipated discharge in approximately 2 day(s).   Burgess Estelle, MD 12/01/2016, 10:37 AM

## 2016-12-01 NOTE — Evaluation (Signed)
Physical Therapy Evaluation Patient Details Name: Christine [name] MRN: [id number] DOB: 01/24/1934 Today's Date: 12/01/2016   History of Present Illness  83 y.o. female with a PMH of HTN, HLD, hypothyroidism, CAD, DM2, COPD, and CKD who presents with stuttering speech and agitation and was found to have hyponatremia (128 when corrected for hyperglycemia) and hypertensive urgency  Clinical Impression  Patient seen for mobility assessment. At this time, patient ambulating without difficulty. Demonstrates no deficits in strength or functional status at this time. Supervision level only for exam purpose, no cues or physical assist required. At this time, no further acute PT needs, will sign off.    Follow Up Recommendations No PT follow up    Equipment Recommendations  None recommended by PT    Recommendations for Other Services       Precautions / Restrictions Precautions Precautions: None Restrictions Weight Bearing Restrictions: No      Mobility  Bed Mobility Overal bed mobility: Modified Independent                Transfers Overall transfer level: Needs assistance Equipment used: None Transfers: Sit to/from Stand Sit to Stand: Supervision         General transfer comment: supervision   Ambulation/Gait Ambulation/Gait assistance: Supervision Ambulation Distance (Feet): 380 Feet Assistive device: None Gait Pattern/deviations: Step-through pattern;Decreased stride length Gait velocity: WFL Gait velocity interpretation: at or above normal speed for age/gender General Gait Details: pt demonstrated no instability or LOB, supervision for safety  Stairs            Wheelchair Mobility    Modified Rankin (Stroke Patients Only)       Balance Overall balance assessment: Needs assistance Sitting-balance support: Feet supported;No upper extremity supported Sitting balance-Leahy Scale: Good     Standing balance support: During functional activity;No upper  extremity supported Standing balance-Leahy Scale: Good                 High Level Balance Comments: performed turns, weight shifts, head turns and speed changes without any difficulty or overt LOB             Pertinent Vitals/Pain Pain Assessment: No/denies pain    Home Living Family/patient expects to be discharged to:: Private residence Living Arrangements: Other relatives;Children Available Help at Discharge: Family;Available PRN/intermittently Type of Home: House Home Access: Level entry     Home Layout: One level Home Equipment: None      Prior Function Level of Independence: Independent               Hand Dominance   Dominant Hand: Right    Extremity/Trunk Assessment   Upper Extremity Assessment Upper Extremity Assessment: Overall WFL for tasks assessed    Lower Extremity Assessment Lower Extremity Assessment: Overall WFL for tasks assessed    Cervical / Trunk Assessment Cervical / Trunk Assessment: Normal  Communication   Communication: No difficulties  Cognition Arousal/Alertness: Awake/alert Behavior During Therapy: WFL for tasks assessed/performed Overall Cognitive Status: Within Functional Limits for tasks assessed                 General Comments: noted, psych following pt. Previously noted confabulation however patient appropriate during PT evaluation    General Comments      Exercises     Assessment/Plan    PT Assessment Patent does not need any further PT services  PT Problem List            PT Treatment Interventions  PT Goals (Current goals can be found in the Care Plan section)  Acute Rehab PT Goals Patient Stated Goal: return home PT Goal Formulation: All assessment and education complete, DC therapy    Frequency     Barriers to discharge        Co-evaluation               End of Session   Activity Tolerance: Patient tolerated treatment well Patient left: in chair;with call bell/phone  within reach;with family/visitor present Nurse Communication: Mobility status         Time: 1533-1550 PT Time Calculation (min) (ACUTE ONLY): 17 min   Charges:   PT Evaluation $PT Eval Low Complexity: 1 Procedure     PT G Codes:        Duncan Dull 12/23/2016, 4:52 PM  Alben Deeds, [place] DPT  [phone]

## 2016-12-02 LAB — GLUCOSE, CAPILLARY
GLUCOSE-CAPILLARY: 152 mg/dL — AB (ref 65–99)
GLUCOSE-CAPILLARY: 186 mg/dL — AB (ref 65–99)
Glucose-Capillary: 223 mg/dL — ABNORMAL HIGH (ref 65–99)
Glucose-Capillary: 159 mg/dL — ABNORMAL HIGH (ref 65–99)
Glucose-Capillary: 370 mg/dL — ABNORMAL HIGH (ref 65–99)

## 2016-12-02 LAB — BASIC METABOLIC PANEL
Anion gap: 9 (ref 5–15)
BUN: 19 mg/dL (ref 6–20)
CALCIUM: 9.9 mg/dL (ref 8.9–10.3)
CO2: 26 mmol/L (ref 22–32)
CREATININE: 0.91 mg/dL (ref 0.44–1.00)
Chloride: 94 mmol/L — ABNORMAL LOW (ref 101–111)
GFR calc non Af Amer: 58 mL/min — ABNORMAL LOW (ref >60)
Glucose, Bld: 246 mg/dL — ABNORMAL HIGH (ref 65–99)
Potassium: 4.2 mmol/L (ref 3.5–5.1)
SODIUM: 129 mmol/L — AB (ref 135–145)

## 2016-12-02 MED ORDER — LORAZEPAM 0.5 MG PO TABS: 0.5000 mg | ORAL_TABLET | Freq: Every evening | ORAL | 0 refills | Status: DC | PRN

## 2016-12-02 MED ORDER — IRBESARTAN 300 MG PO TABS
300.0000 mg | ORAL_TABLET | Freq: Every day | ORAL | Status: DC
  Administered 2016-12-02: 300 mg via ORAL
  Filled 2016-12-02: qty 1

## 2016-12-02 MED ORDER — SODIUM CHLORIDE 1 G PO TABS: 1.0000 g | ORAL_TABLET | Freq: Two times a day (BID) | ORAL | 0 refills | Status: DC

## 2016-12-02 MED ORDER — HYDROXYZINE HCL 10 MG PO TABS: 10.0000 mg | ORAL_TABLET | Freq: Three times a day (TID) | ORAL | 0 refills | Status: DC

## 2016-12-02 MED ORDER — METOPROLOL SUCCINATE ER 25 MG PO TB24: 50.0000 mg | ORAL_TABLET | Freq: Every day | ORAL | 1 refills | Status: DC

## 2016-12-02 MED ORDER — LORAZEPAM 0.5 MG PO TABS: 0.5000 mg | ORAL_TABLET | Freq: Three times a day (TID) | ORAL | 0 refills | Status: DC

## 2016-12-02 MED ORDER — VENLAFAXINE HCL ER 75 MG PO CP24: 75.0000 mg | ORAL_CAPSULE | Freq: Every day | ORAL | 0 refills | Status: DC

## 2016-12-02 MED ORDER — LORAZEPAM 0.5 MG PO TABS
0.5000 mg | ORAL_TABLET | Freq: Once | ORAL | Status: AC
  Administered 2016-12-02: 0.5 mg via ORAL
  Filled 2016-12-02: qty 1

## 2016-12-02 MED ORDER — VALSARTAN 160 MG PO TABS: 320.0000 mg | ORAL_TABLET | Freq: Every day | ORAL | 3 refills | Status: DC

## 2016-12-02 MED ORDER — GLUCERNA SHAKE PO LIQD: 237.0000 mL | Freq: Three times a day (TID) | ORAL | Status: DC | PRN

## 2016-12-02 NOTE — Discharge Instructions (Signed)
Please stop taking Imipramine.  Instead please take: - Venlafaxine (Effexor-XR) 75 mg daily - Hydroxyzine (Atarax/Vistaril) 10 mg three times daily This will help treat your depression and anxiety.  Please continue to eat three meals a day and snacks. Please limit your water intake, drink other beverages like Ensure, Carnation, or Gatorade if necessary.  Please continue to take sodium chloride tablets twice daily.   We have adjusted your blood pressure medications as well. We have increased your Metoprolol XL to 50 mg daily. We have increased your Valsartan to 320 mg daily. Please continue to take Amlodipine 10 mg daily as previously.  We have provided a short prescription for Lorazepam (Ativan) 0.5 mg to take as needed once at night to help with anxiety or difficulty sleeping.  Please come to your hospital follow up appointment on Wednesday of this week (12/04/16)

## 2016-12-02 NOTE — Care Management (Signed)
ED CM received call from [place] on [place] regarding patient being discharged this evening and resuming [facility] services with [facility].  No HH orders noted in the record. CM encouraged RN to obtain [facility] orders and CM will fax to [facility]. CM attempted to contact patient confirm the referral to resume services but to reach her at number provided. CM will make 2nd attempt tomorrow to contact patient.Christine [name]

## 2016-12-02 NOTE — Progress Notes (Addendum)
Patient agitated and combative.Partient hitting granddaughter and nurse. Patient CBG checked 223. MD called to unit to assess patient. No orders received at this time. Will continue to monitor patient.   Addendum: Order received for ativan. Order initiated. Will continue to monitor patient.

## 2016-12-02 NOTE — Progress Notes (Signed)
RN called for pt not interacting to staff or family, pt had a similar episode 2 days ago with a complete stroke work up and a psychiatrist which led to new psych medications started. Per granddaughter pt has only slept for one hour and received a something to help her sleep just before 2200. Upon my arrival pt alert and oriented x4, answering all questions appropriately, no neuro deficits noted, grips and strengths equal bilaterally, pt denies any pain. I advised Salley Scarlet to call for any additional needs and to have day shift let Teaching service know during mornings rounds, as this could be an effect from the new medication. Granddaughters biggest concern was pt had not been to sleep all night other than the one hour, pt denied feeling tired or sleepy.

## 2016-12-02 NOTE — Progress Notes (Signed)
Ativan helped with patient's agitation. Patient in bed currently asleep. Family is at the bedside.

## 2016-12-02 NOTE — Progress Notes (Signed)
   Subjective:  Combative behavior last night with RN and daughter, intermittently refusing to answer questions, did not sleep and required 0.5 mg po Ativan this AM. Now sleeping comfortably. Family at bedside concerned that her new PM medications for sleep may be responsible for he bizarre behavior last night. Daughter also reports the patient was feeling very upset and guilty about missing the funeral service for her sister this weekend. Patient tolerating Effexor and Hydroxyzine well.  Objective:  Vital signs in last 24 hours: Vitals:   12/01/16 2112 12/01/16 2300 12/02/16 0337 12/02/16 0400  BP: (!) 182/62 (!) 174/82 (!) 178/102   Pulse: 79 92 87   Resp: 19  18   Temp: 98 F (36.7 C)  97.6 F (36.4 C)   TempSrc: Oral  Oral   SpO2: 99%  100%   Weight:    115 lb 11.2 oz (52.5 kg)  Height:       General: Vital signs reviewed. Patient in no acute distress, sleeping comfortably in bed. Daughter at bedside  Cardiovascular: Regular rate, rhythm, no murmur appreciated  Pulmonary/Chest: Clear to auscultation bilaterally, no wheezes, rales, or rhonchi. Abdominal: Soft, non-tender, non-distended, BS + Extremities: No lower extremity edema bilaterally,  Skin: Warm, dry and intact.   Assessment/Plan:  Principal Problem:   MDD (major depressive disorder), recurrent severe, without psychosis ([place]) Active Problems:   Hyponatremia   Protein-calorie malnutrition, severe   Dysarthria  New onset dysarthria: abnormal neuro exam per admitting team with FNF abnormality and dysarthria. Recent stress, loss in the family, and niece living with patient who finds her living situation more stressful. CT head, MRI brain were unremarkable. Likely grief reaction / anxiety related. - Venlafaxine and hydroxyzine for anxiety per psychiatry  HTN: SBP 200s on admission. Had dysarthria on admission. CVA ruled out. Persistently hypertensive up to 170s+ -amlodipine 10 mg -metoprolol 25 mg daily--> increased to  50 mg daily - Irbesartan increase to 300mg  -PRN hydral for SBP > 180   Hyponatremia: likely due to tea and toast diet Imipramine can also cause SIADH. Na is 129 today- Pt has been drinking a lot of free water without other solute intake. -fluid restriction to 1.5L -salt tabs  -encouraged increased solute intake -consulted nutrition  Protein calorie malnutrition  -ordered ensure feeding supplements and MVM   Constipation and weight loss: CT abd pelvis neg. Likely depression related, normal CT A/P -ordered miralax and senna    Non insulin dependent Type 2 diabetes: Last A1c 10/2016 6.6  - SSI-M with HS coverage and CBGs  Hypothyroid : TSH 1/9 2.1 - ordered home medication synthroid 25 mg qd   COPD  -ordered home meds dulera BID, loratadine, and fluticasone  Functional status: -ordered PT and OT eval   Dispo: Anticipated discharge today or tomorrow.  Asencion Partridge, MD 12/02/2016, 7:31 AM

## 2016-12-02 NOTE — Progress Notes (Signed)
Pt's family is requesting to see the MD again, so that they may assess pt while she is awake.  MD has responded, and agreed.

## 2016-12-02 NOTE — Progress Notes (Signed)
Patient noted to be non interactive with family and staff. Patient will not answer questions of staff. Laying in bed unresponsive. Patient assessed. Rapid response nurse called to further evaluate patient. Patient now responsive and interacting with staff and family. Vitals stable.  Will continue to monitor patient.

## 2016-12-02 NOTE — Progress Notes (Signed)
Nutrition Follow-up  DOCUMENTATION CODES:   Severe malnutrition in context of chronic illness  INTERVENTION:  Recommend liberalizing diet Continue Ensure Enlive 2-3 times daily, each supplement provides 350 kcal and 20 grams of protein  Provide Glucerna Shakes PRN as a substitute for Ensure, if pt's blood glucose is elevated  NUTRITION DIAGNOSIS:   Malnutrition related to chronic illness as evidenced by severe depletion of muscle mass, severe depletion of body fat, percent weight loss.  ongoing  GOAL:   Patient will meet greater than or equal to 90% of their needs  Being met  MONITOR:   PO intake, Supplement acceptance, Weight trends, I & O's  REASON FOR ASSESSMENT:   Consult Assessment of nutrition requirement/status  ASSESSMENT:   83 y.o. female with a PMH of HTN, HLD, hypothyroidism, CAD, DM2, COPD, and CKD who presents with stuttering speech and not acting her self. She was recently hospitalized 1/9 -11/28/2016 for changes in mental status and confusion and found to be hyponatremic with hypertensive urgency. She reported increased water intake with minimal diet for months and had low serum and urine osmolality. She was fluid restricted and her sodium corrected and she was ambulating and eating and drinking well on the day of discharge. Her daughter reports that she was back to her normal self yesterday except for inapropriate crying. This morning her daughter found her blood pressure to be SBP 210 and blood glucose 313. She took her medications this morning and drank 3 16-ounce bottles of water but had not eaten any food. Her daughter called the clinic and was advised to bring the patient to the internal medicine acute care clinic. The patients family brought her to the ED where she was found to have BP 200/90s and Na 126. She was given a 500cc NS bolus and admitted for hyponatremia and hypertensive urgency.   Pt assessed by nutrition team on 11/30/16. New RD consult today. RD  familiar with patient from previous admission at which time RD spoke with patient and her family. Pt states that she has been drinking Ensure and eating fairly well. RD recommend drinking Ensure 2-3 times daily and snacking as needed in between meals. Pt has no complaints or concerns.   Pt's daughter relates glucose of 370 to pt eating a lot of applesauce this morning.   Labs: glucose ranging 159 to 370 mg/dL, low sodium,   Diet Order:  Diet renal/carb modified with fluid restriction Diet-HS Snack? Nothing; Room service appropriate? Yes; Fluid consistency: Thin; Fluid restriction: 1500 mL Fluid Diet - low sodium heart healthy  Skin:  Reviewed, no issues  Last BM:  1/14  Height:   Ht Readings from Last 1 Encounters:  11/29/16 5' 4"  (1.626 m)    Weight:   Wt Readings from Last 1 Encounters:  12/02/16 115 lb 11.2 oz (52.5 kg)    Ideal Body Weight:  54.5 kg  BMI:  Body mass index is 19.86 kg/m.  Estimated Nutritional Needs:   Kcal:  1500-1700  Protein:  62-78g/day   Fluid:  >1.5L/day   EDUCATION NEEDS:   No education needs identified at this time  Scarlette Ar RD, CSP, LDN Inpatient Clinical Dietitian Pager: [phone] After Hours Pager: [phone]

## 2016-12-02 NOTE — Progress Notes (Signed)
Orders received for pt discharge.  Discharge summary printed and reviewed with pt.  Daughter of pt requested that MD write an order for Ativan 0.5mg  PO for anxiety.  MD agreed to do so. Paper Rx for Ativan given to daughter. Explained medication regimen, and pt had no further questions at this time.  Daughter questioned if their [facility] of choice[facility], would be calling them to arrange services.  I contacted Mariann Laster, on-call CSM, and she advised that I contact MD to write orders to resume [place] (from past admission/discharge on 11/27/16) and to write orders for Face-to-Face for Medicare.  Contacted MD and he wrote these orders.  IV removed and site remains clean, dry, intact.  Telemetry removed.  Pt in stable condition and awaiting transport.

## 2016-12-02 NOTE — Discharge Summary (Signed)
Name: Christine [name] MRN: [id number] DOB: 01/24/1934 83 y.o. PCP: [name], MD  Date of Admission: 11/29/2016  1:05 PM Date of Discharge: 12/02/2016 Attending Physician: Axel Filler, MD  Discharge Diagnosis: 1. Major depressive disorder 2. Hyponatremia 3. Hypertension 4. Constipation  Principal Problem:   MDD (major depressive disorder), recurrent severe, without psychosis ([place]) Active Problems:   Hyponatremia   Protein-calorie malnutrition, severe   Dysarthria   Discharge Medications: Allergies as of 12/02/2016      Reactions   Aspirin Swelling, Other (See Comments)   Angioedema   Nsaids Other (See Comments)   "interacts with heart medications"   Codeine Nausea And Vomiting, Other (See Comments)   "makes me deathly sick" - can take with nausea medicine    Other Other (See Comments)   MSG   Penicillins Diarrhea   "real bad" Has patient had a PCN reaction causing immediate rash, facial/tongue/throat swelling, SOB or lightheadedness with hypotension: Yes Has patient had a PCN reaction causing severe rash involving mucus membranes or skin necrosis: No Has patient had a PCN reaction that required hospitalization No Has patient had a PCN reaction occurring within the last 10 years: Yes If all of the above answers are "NO", then may proceed with Cephalosporin use.      Medication List    STOP taking these medications   imipramine 25 MG tablet Commonly known as:  TOFRANIL     TAKE these medications   acetaminophen 500 MG tablet Commonly known as:  TYLENOL Take 1,000 mg by mouth every 6 (six) hours as needed for headache. For pain   ADVAIR DISKUS 250-50 MCG/DOSE Aepb Generic drug:  Fluticasone-Salmeterol inhale 1 dose by mouth every 12 hours   amLODipine 10 MG tablet Commonly known as:  NORVASC Take 1 tablet (10 mg total) by mouth daily.   atorvastatin 40 MG tablet Commonly known as:  LIPITOR Take 1 tablet (40 mg total) by mouth every evening.     clopidogrel 75 MG tablet Commonly known as:  PLAVIX take 1 tablet by mouth every evening   esomeprazole 40 MG capsule Commonly known as:  NEXIUM take 1 capsule by mouth once daily BEFORE BREAKFAST   feeding supplement (ENSURE ENLIVE) Liqd Take 237 mLs by mouth 2 (two) times daily between meals.   fluticasone 50 MCG/ACT nasal spray Commonly known as:  FLONASE Place 1 spray into both nostrils daily. What changed:  when to take this  reasons to take this   glimepiride 1 MG tablet Commonly known as:  AMARYL take 1 tablet by mouth once daily BEFORE BREAKFAST   guaiFENesin-dextromethorphan 100-10 MG/5ML syrup Commonly known as:  ROBITUSSIN DM Take 5 mLs by mouth every 4 (four) hours as needed for cough.   hydroxypropyl methylcellulose / hypromellose 2.5 % ophthalmic solution Commonly known as:  ISOPTO TEARS / GONIOVISC Place 1 drop into both eyes at bedtime.   hydrOXYzine 10 MG tablet Commonly known as:  ATARAX/VISTARIL Take 1 tablet (10 mg total) by mouth 3 (three) times daily.   isosorbide mononitrate 30 MG 24 hr tablet Commonly known as:  IMDUR Take 30 mg by mouth daily.   levothyroxine 25 MCG tablet Commonly known as:  SYNTHROID, LEVOTHROID take 1 tablet by mouth once daily   loratadine 10 MG tablet Commonly known as:  CLARITIN Take 10 mg by mouth daily as needed for allergies, rhinitis or itching.   LORazepam 0.5 MG tablet Commonly known as:  ATIVAN Take 1 tablet (0.5 mg total)  by mouth every 8 (eight) hours.   metoprolol succinate 25 MG 24 hr tablet Commonly known as:  TOPROL XL Take 2 tablets (50 mg total) by mouth daily. What changed:  how much to take   multivitamin with minerals Tabs tablet Take 1 tablet by mouth daily.   NITROSTAT 0.4 MG SL tablet Generic drug:  nitroGLYCERIN place 1 tablet under the tongue if needed every 5 minutes for chest pain for 3 doses IF NO RELIEF AFTER 3RD DOSE CALL PRESCRIBER OR 911.   ondansetron 4 MG tablet Commonly  known as:  ZOFRAN Take 1 tablet (4 mg total) by mouth every 6 (six) hours as needed for nausea.   polycarbophil 625 MG tablet Commonly known as:  FIBERCON Take 1 tablet (625 mg total) by mouth daily.   polyethylene glycol packet Commonly known as:  MIRALAX / GLYCOLAX Take 17 g by mouth daily as needed.   senna 8.6 MG Tabs tablet Commonly known as:  SENOKOT Take 1 tablet (8.6 mg total) by mouth at bedtime as needed for mild constipation.   sodium chloride 1 g tablet Take 1 tablet (1 g total) by mouth 2 (two) times daily with a meal. Start taking on:  12/03/2016   valsartan 160 MG tablet Commonly known as:  DIOVAN Take 2 tablets (320 mg total) by mouth daily. What changed:  how much to take   venlafaxine XR 75 MG 24 hr capsule Commonly known as:  EFFEXOR-XR Take 1 capsule (75 mg total) by mouth daily with breakfast. Start taking on:  12/03/2016   VITAMIN C PO Take 1 tablet by mouth daily.       Disposition and follow-up:   Christine [name] was discharged from [facility] in Stable condition.  At the hospital follow up visit please address:  1.  Major depressive disorder - patient with new psychiatric instability following recent loss of her sister, assess her psychiatric and neurologic status, assess compliance and tolerance of new medication regimen of Effexor and Hydroxyzine  Hyponatremia - assess dietary intake and compliance with salt tablets, assess sodium level  Hypertension - assess BP and compliance with home hypertensive regimen  Constipation - assess BM frequency  2.  Labs / imaging needed at time of follow-up: BMP  3.  Pending labs/ test needing follow-up: None  Follow-up Appointments:    Hospital Course by problem list: Principal Problem:   MDD (major depressive disorder), recurrent severe, without psychosis ([place]) Active Problems:   Hyponatremia   Protein-calorie malnutrition, severe   Dysarthria   1. Major depressive  disorder Christine [name] is a 83 y.o. female with a PMH of HTN, HLD, hypothyroidism, CAD, DM2, COPD, and CKD who presented on 1/12 with stuttering speech and depressive symptoms. She was recently hospitalized 1/9 -11/28/2016 for changes in mental status and confusion and found to be hyponatremic with hypertensive urgency. She went back home and was doing fairly well until day prior when she suddenly developed difficulty with speech, babbling, expressive difficulty, frustration lasting a few minutes. Her sister died 3 weeks ago and  her depression is much worse since that time. Her sister's funeral was day of admission and she was very upset to miss it. She was largely neurologically intact on admission and her antidepressant Imipramine was held on admission due to concern over adverse effects. She was evaluated on 1/13 by psychiatry who recommended starting Effexor and Hydroxyzine TID for anxiety and depression. The patient had an episode of AMS/dysarthira on  1/13 that prompted stat CT head that was normal. This was eventually followed by a brain MRI that was also unremarkable. Christine [name] continued to experience nightly alternating episodes of clarity and then episodes of anxiety and distress with hyperreligious and guilty thoughts, perseverating on certain phrases, often with decreased responsiveness. PRN Ativan was used on 1/15 AM and allowed the patient to have a good nights sleep. Patient's Effexor dose was increase at this time and seemed to be tolerating this medication and Hydroxyzine fairly well. Patient was deemed stable  for discharge with family supervision on 1/15 and they were provided with a short term Rx for prn Ativan to help with sleep at night.   2. Hyponatremia - presented with persistent hyponatremia of 128 (corrected for hyperglycemia), this is a persistent issue present since previous hospitalization and workup and clinic history find it most likely due to high free water and low solute  intake. Initial fluid resuscitation corrected the sodium to 131 the following day. Patient was fluid restricted and started on salt tablets and her Na remained stable at 128-129 until discharge.   3. Hypertension - SBPs in 200s on admission, labile pressures but persistently hypertensive at home and at previous admission. Patient was started back on home antihypertensive medications but remained very hypertensive. Her beta blocker and ARB dose were doubled with improved BP control (final doses Amlodipine 10, Metoprolol 50, Imdur 30, Irbesartan 300mg ).  4. Constipation - recent history of weeks of constipation, weight loss, poor po intake, concerning for occult GI/GU malignancy. CT A/P on 1/13 revealed no abnormalities. Patient was started on daily Fiber supplement, daily Miralax PRN, and daily Senna QHS PRN and having regular BMs at day of discharge.   Discharge Vitals:   BP (!) 155/52 (BP Location: Left Arm)   Pulse 75   Temp 98.1 F (36.7 C) (Oral)   Resp 18   Ht 5\' 4"  (1.626 m)   Wt 115 lb 11.2 oz (52.5 kg)   LMP 02/11/1975   SpO2 100%   BMI 19.86 kg/m   Pertinent Labs, Studies, and Procedures:  Results for KAYLINN, RIM (MRN [id number]) as of 12/09/2016 19:51  Ref. Range 11/30/2016 06:04 12/01/2016 04:30 12/01/2016 17:57 12/02/2016 05:51  Sodium Latest Ref Range: 135 - 145 mmol/L 131 (L) 129 (L) 128 (L) 129 (L)  Potassium Latest Ref Range: 3.5 - 5.1 mmol/L 4.5 4.3 4.4 4.2  Chloride Latest Ref Range: 101 - 111 mmol/L 98 (L) 97 (L) 96 (L) 94 (L)  CO2 Latest Ref Range: 22 - 32 mmol/L 25 24 22 26   Glucose Latest Ref Range: 65 - 99 mg/dL 148 (H) 116 (H) 224 (H) 246 (H)  BUN Latest Ref Range: 6 - 20 mg/dL 11 18 22  (H) 19  Creatinine Latest Ref Range: 0.44 - 1.00 mg/dL 0.86 0.94 0.89 0.91  Calcium Latest Ref Range: 8.9 - 10.3 mg/dL 10.1 9.7 9.6 9.9  Anion gap Latest Ref Range: 5 - 15  8 8 10 9    CT ABDOMEN AND PELVIS WITH CONTRAST 11/30/2016 13:03 IMPRESSION: 1. No acute abdominal process  or mass.  CT HEAD WITHOUT CONTRAST 11/30/2016 13:00 IMPRESSION: 1. No acute intracranial abnormalities. No change from the prior Study.  MRI HEAD WITHOUT CONTRAST 11/30/2016 18:51 IMPRESSION: Prematurely truncated, and motion degraded exam, reveals no evidence for acute stroke in the brain, specifically in the posterior fossa. Mild atrophy and small vessel disease.   Discharge Instructions: Discharge Instructions    Diet - low sodium heart healthy  Complete by:  As directed    Increase activity slowly    Complete by:  As directed       Signed: Asencion Partridge, MD 12/02/2016, 6:12 PM   Pager: [phone]

## 2016-12-03 ENCOUNTER — Encounter (HOSPITAL_COMMUNITY): Payer: Self-pay | Admitting: *Deleted

## 2016-12-03 ENCOUNTER — Emergency Department (HOSPITAL_COMMUNITY)
Admission: EM | Admit: 2016-12-03 | Discharge: 2016-12-04 | Disposition: A | Discharge status: Home / Self Care | Payer: Medicare Other | Attending: Emergency Medicine | Admitting: Emergency Medicine

## 2016-12-03 DIAGNOSIS — R4182 Altered mental status, unspecified: Secondary | ICD-10-CM | POA: Diagnosis not present

## 2016-12-03 DIAGNOSIS — F918 Other conduct disorders: Secondary | ICD-10-CM | POA: Diagnosis not present

## 2016-12-03 DIAGNOSIS — E039 Hypothyroidism, unspecified: Secondary | ICD-10-CM | POA: Insufficient documentation

## 2016-12-03 DIAGNOSIS — Z7984 Long term (current) use of oral hypoglycemic drugs: Secondary | ICD-10-CM | POA: Diagnosis not present

## 2016-12-03 DIAGNOSIS — F919 Conduct disorder, unspecified: Secondary | ICD-10-CM | POA: Insufficient documentation

## 2016-12-03 DIAGNOSIS — I5030 Unspecified diastolic (congestive) heart failure: Secondary | ICD-10-CM | POA: Diagnosis not present

## 2016-12-03 DIAGNOSIS — R451 Restlessness and agitation: Secondary | ICD-10-CM | POA: Diagnosis present

## 2016-12-03 DIAGNOSIS — N183 Chronic kidney disease, stage 3 (moderate): Secondary | ICD-10-CM | POA: Insufficient documentation

## 2016-12-03 DIAGNOSIS — I2510 Atherosclerotic heart disease of native coronary artery without angina pectoris: Secondary | ICD-10-CM | POA: Insufficient documentation

## 2016-12-03 DIAGNOSIS — E1122 Type 2 diabetes mellitus with diabetic chronic kidney disease: Secondary | ICD-10-CM | POA: Insufficient documentation

## 2016-12-03 DIAGNOSIS — I130 Hypertensive heart and chronic kidney disease with heart failure and stage 1 through stage 4 chronic kidney disease, or unspecified chronic kidney disease: Secondary | ICD-10-CM | POA: Diagnosis not present

## 2016-12-03 DIAGNOSIS — R4689 Other symptoms and signs involving appearance and behavior: Secondary | ICD-10-CM

## 2016-12-03 DIAGNOSIS — Z79899 Other long term (current) drug therapy: Secondary | ICD-10-CM | POA: Diagnosis not present

## 2016-12-03 LAB — COMPREHENSIVE METABOLIC PANEL
ALK PHOS: 121 U/L (ref 38–126)
ALT: 24 U/L (ref 14–54)
AST: 26 U/L (ref 15–41)
Albumin: 3.8 g/dL (ref 3.5–5.0)
Anion gap: 12 (ref 5–15)
BUN: 21 mg/dL — AB (ref 6–20)
CALCIUM: 9.6 mg/dL (ref 8.9–10.3)
CO2: 24 mmol/L (ref 22–32)
CREATININE: 0.92 mg/dL (ref 0.44–1.00)
Chloride: 94 mmol/L — ABNORMAL LOW (ref 101–111)
GFR calc non Af Amer: 57 mL/min — ABNORMAL LOW (ref >60)
Glucose, Bld: 146 mg/dL — ABNORMAL HIGH (ref 65–99)
Potassium: 4.1 mmol/L (ref 3.5–5.1)
SODIUM: 130 mmol/L — AB (ref 135–145)
Total Bilirubin: 0.4 mg/dL (ref 0.3–1.2)
Total Protein: 6.4 g/dL — ABNORMAL LOW (ref 6.5–8.1)

## 2016-12-03 LAB — CBC
HCT: 34.9 % — ABNORMAL LOW (ref 36.0–46.0)
Hemoglobin: 11.9 g/dL — ABNORMAL LOW (ref 12.0–15.0)
MCH: 29.8 pg (ref 26.0–34.0)
MCHC: 34.1 g/dL (ref 30.0–36.0)
MCV: 87.5 fL (ref 78.0–100.0)
PLATELETS: 288 10*3/uL (ref 150–400)
RBC: 3.99 MIL/uL (ref 3.87–5.11)
RDW: 14.5 % (ref 11.5–15.5)
WBC: 6.2 10*3/uL (ref 4.0–10.5)

## 2016-12-03 LAB — CBG MONITORING, ED: GLUCOSE-CAPILLARY: 150 mg/dL — AB (ref 65–99)

## 2016-12-03 MED ORDER — GUAIFENESIN-DM 100-10 MG/5ML PO SYRP: 5.0000 mL | ORAL_SOLUTION | ORAL | Status: DC | PRN

## 2016-12-03 MED ORDER — LORAZEPAM 2 MG/ML IJ SOLN
1.0000 mg | Freq: Once | INTRAMUSCULAR | Status: AC
  Administered 2016-12-03: 1 mg via INTRAVENOUS
  Filled 2016-12-03: qty 1

## 2016-12-03 MED ORDER — AMLODIPINE BESYLATE 5 MG PO TABS
10.0000 mg | ORAL_TABLET | Freq: Every day | ORAL | Status: DC
  Administered 2016-12-03: 10 mg via ORAL
  Filled 2016-12-03: qty 2

## 2016-12-03 MED ORDER — HYDROXYZINE HCL 10 MG PO TABS
10.0000 mg | ORAL_TABLET | Freq: Three times a day (TID) | ORAL | Status: DC
Start: 2016-12-03 — End: 2016-12-03

## 2016-12-03 MED ORDER — ZOLPIDEM TARTRATE 5 MG PO TABS: 5.0000 mg | ORAL_TABLET | Freq: Every evening | ORAL | Status: DC | PRN

## 2016-12-03 MED ORDER — LORATADINE 10 MG PO TABS: 10.0000 mg | ORAL_TABLET | Freq: Every day | ORAL | Status: DC | PRN

## 2016-12-03 MED ORDER — LEVOTHYROXINE SODIUM 25 MCG PO TABS: 25.0000 ug | ORAL_TABLET | Freq: Every day | ORAL | Status: DC

## 2016-12-03 MED ORDER — PANTOPRAZOLE SODIUM 40 MG PO TBEC
40.0000 mg | DELAYED_RELEASE_TABLET | Freq: Every day | ORAL | Status: DC
Start: 2016-12-03 — End: 2016-12-03

## 2016-12-03 MED ORDER — GLIMEPIRIDE 1 MG PO TABS: 1.0000 mg | ORAL_TABLET | Freq: Every day | ORAL | Status: DC

## 2016-12-03 MED ORDER — LORAZEPAM 0.5 MG PO TABS: 0.5000 mg | ORAL_TABLET | Freq: Every evening | ORAL | 0 refills | Status: DC | PRN

## 2016-12-03 MED ORDER — IRBESARTAN 300 MG PO TABS: 300.0000 mg | ORAL_TABLET | Freq: Every day | ORAL | Status: DC

## 2016-12-03 MED ORDER — IRBESARTAN 300 MG PO TABS
300.0000 mg | ORAL_TABLET | Freq: Every day | ORAL | Status: DC
Start: 2016-12-03 — End: 2016-12-04
  Administered 2016-12-03: 300 mg via ORAL
  Filled 2016-12-03: qty 1

## 2016-12-03 MED ORDER — METOPROLOL SUCCINATE ER 50 MG PO TB24
50.0000 mg | ORAL_TABLET | Freq: Every day | ORAL | Status: DC
  Administered 2016-12-03: 50 mg via ORAL
  Filled 2016-12-03: qty 1

## 2016-12-03 MED ORDER — VENLAFAXINE HCL ER 75 MG PO CP24
75.0000 mg | ORAL_CAPSULE | Freq: Every day | ORAL | Status: DC
Start: 2016-12-04 — End: 2016-12-03

## 2016-12-03 MED ORDER — ATORVASTATIN CALCIUM 40 MG PO TABS: 40.0000 mg | ORAL_TABLET | Freq: Every evening | ORAL | Status: DC

## 2016-12-03 MED ORDER — ONDANSETRON HCL 4 MG PO TABS: 4.0000 mg | ORAL_TABLET | Freq: Four times a day (QID) | ORAL | Status: DC | PRN

## 2016-12-03 MED ORDER — ONDANSETRON HCL 4 MG PO TABS: 4.0000 mg | ORAL_TABLET | Freq: Three times a day (TID) | ORAL | Status: DC | PRN

## 2016-12-03 MED ORDER — CLOPIDOGREL BISULFATE 75 MG PO TABS
75.0000 mg | ORAL_TABLET | Freq: Every evening | ORAL | Status: DC
  Administered 2016-12-03: 75 mg via ORAL
  Filled 2016-12-03: qty 1

## 2016-12-03 MED ORDER — ISOSORBIDE MONONITRATE ER 30 MG PO TB24
30.0000 mg | ORAL_TABLET | Freq: Every day | ORAL | Status: DC
Start: 2016-12-03 — End: 2016-12-03

## 2016-12-03 MED ORDER — LORAZEPAM 1 MG PO TABS
1.0000 mg | ORAL_TABLET | Freq: Three times a day (TID) | ORAL | Status: DC | PRN
  Administered 2016-12-03: 1 mg via ORAL
  Filled 2016-12-03: qty 1

## 2016-12-03 NOTE — ED Notes (Signed)
Patient wanting something to drink ginger ale and graham crackers given.

## 2016-12-03 NOTE — ED Notes (Signed)
Patient was not able to use the bedpan; per Claiborne Billings, RN  ok'd by and placed patient in wheelchair and patient was taken to the bathroom to use, family member assisting patient in bathroom

## 2016-12-03 NOTE — ED Notes (Signed)
States she needed to go to the bathroom, unable to use bedpan. Assisted up to bathroom in wheelchair.

## 2016-12-03 NOTE — ED Notes (Signed)
Patient presents to ed via [facility] daughter states pain was seen in the  ED 1/8 however it was very busy so they took her mother home and wasn't seen. States patient was c/o not being able to have a BM had taken several OTC meds. Without relief Tues after taking Murelax  Was able to have a bm. However patient wa like a "zombie" clammy WEd. Am came back to the ED and was admitted with hyponatremia. D/c'd on thurs. Was slightly confused, got very upset and was brought back to the hospital on Fri am was re-admitted. D/c'd yest. Daughter states patient ate dinner last pm was acting her norm. Around 10pm was given her Atarax and approx. 20-30 minutes later started yelling and was out of control was able to give her an ativan and she slept until 5am. States she woke up swinging her arms and very agitated again. Presently laying on stretcher  Calm family at bedside. Patient will open her eyes when spoken to and is oriented.

## 2016-12-03 NOTE — ED Notes (Signed)
Patient is now awake called [facility] aware patient is now ready for TTS spoke with [facility].

## 2016-12-03 NOTE — ED Triage Notes (Signed)
Pt bib [facility], admitted x2 this past week.  First time was low sodium. 2nd time she was admitted, she was combative.  discharged yesterday with alteration in antidepressant medication.30 minutes after dose of new medicine, pt became confused and combative at home.   49- family gave ativan Woke up at 0500, combative, agitated, Family could not get her to take another ativan.   2.5 haldol IM 2.5 versed IV given by EMS 20 g in L forearm 176/76, NSR 80, 12-lead unremarkable, resp 14 post versed, 93% on NRB mask. CBG 204.

## 2016-12-03 NOTE — ED Notes (Signed)
Patient sleeping on stretcher family at bedside. TTS called however unable to assessment because patient is sleeping from previous medication

## 2016-12-03 NOTE — ED Notes (Signed)
2 Apple juices given to family member to give to patient to see if she will drink it or not; Claiborne Billings, RN aware

## 2016-12-03 NOTE — ED Provider Notes (Signed)
[facility] DEPT Provider Note   CSN: [id number] Arrival date & time: 12/03/16  [id number]     History   Chief Complaint Chief Complaint  Patient presents with  . Altered Mental Status  . Agitation    HPI Christine BARTOSZEK is a 83 y.o. female.  HPI 83 y.o.femalewith a PMH of HTN, HLD, hypothyroidism, CAD, DM2, COPD, and CKDwho presents with agitation. Admitted recently for hyponatremia, and her depression meds were changed. Yday evening, after d.c, pt had an outburst that family was able to control with ativan. At 5 am pt woke up screaming again and family had to call EMS. Pt received im versed and haldol in site and his sedated.    Past Medical History:  Diagnosis Date  . Anemia   . Anginal pain ([place])   . Arthritis    "back, arms, hips; hands" (11/30/2015)  . Benign hypertensive heart and kidney disease with diastolic CHF, NYHA class II and CKD stage III ([place])   . Blood transfusion 1972   "after daughter born, attempted to give me blood; couldn't give it cause my blood was cold" (09/23/2013)  . CAD (coronary artery disease)    a. LHC 08/23/11: dLM 40-50%, oRI 40%, oCFX 40%, oD1 70% (small and not amenable to PCI).  LM lesion did not appear to be flow limiting.  Medical rx was recommended;  b. Echo 08/23/11: mild LVH, EF 60-65%, grade 1 diast dysfxn, mild BAE, PASP 24;  c. 02/2012  Cath: LM 50d, LAD 50p, D1 70ost, RI 70p, RCA ok->Med Rx;  d. 01/2013 Cardiolite: EF 88, no ischemia/infarct.  . Carotid stenosis    a. dopplers 10/12:  0-39% bilat ICA;  b. 10/2012 U/S: 0-39% bilat, f/u 1 yr (10/2013).  . Chronic bronchitis   . Depression   . Diverticulosis of colon (without mention of hemorrhage)   . GERD (gastroesophageal reflux disease)   . Heart murmur, systolic    2-D echo in December 2011 showed a normal EF with grade 1 diastolic dysfunction, trivial pulmonary regurgitation and mildly elevated PA pressure at 37 mmHg probably secondary to her COPD.dynamic obstruction-mid cavity  obliteration;  b. 02/2012 Echo: EF 60-65%, mild LVH, PASP 43mmHg.  Marland Kitchen History of stomach ulcers 1970's  . Hyperlipidemia   . Hypertension   . Hypothyroidism   . Migraines    a. Next atypical symptoms in the past. Patient was started on Neurontin for possible neuropathic origin of her pain.  . Polymyalgia rheumatica ([place])   . Shortness of breath on exertion    "just related to angina >1 yr ago" (09/23/2013)  . Sinus arrhythmia   . Type II diabetes mellitus ([place])    a. On oral hypoglycemic agents.    Patient Active Problem List   Diagnosis Date Noted  . Dysarthria 12/01/2016  . MDD (major depressive disorder), recurrent severe, without psychosis ([place]) 11/30/2016  . Protein-calorie malnutrition, severe 11/30/2016  . Hypertensive urgency 11/27/2016  . Malnutrition of moderate degree 11/27/2016  . Altered mental status   . Appetite loss   . Constipation   . Hyponatremia 11/26/2016  . Elevated alkaline phosphatase level 09/18/2016  . Mixed incontinence urge and stress 05/09/2016  . Dizziness 04/11/2016  . Exertional dyspnea 09/11/2015  . Hypertensive retinopathy of both eyes, grade 2 04/20/2015  . Lumbar spondylosis 06/16/2014  . Carpal tunnel syndrome 02/18/2014  . Right shoulder pain 02/02/2014  . Osteopenia 01/11/2013  . Routine adult health maintenance 01/11/2013  . Chronic renal insufficiency, stage III (moderate) 09/21/2012  .  Recurrent cough 09/18/2012  . Diverticulosis 09/18/2012  . Allergic rhinitis 02/13/2012  . Carotid stenosis 09/25/2011  . Coronary artery disease 08/29/2011  . HEART MURMUR, SYSTOLIC [id number]  . Enlargement of clavicle 03/30/2008  . Solitary pulmonary nodule 12/15/2006  . Hyperlipidemia 11/28/2006  . Hypothyroidism 09/04/2006  . Type 2 diabetes mellitus, controlled ([place]) 09/04/2006  . Migraine 09/04/2006  . Essential hypertension 09/04/2006  . GERD 09/04/2006    Past Surgical History:  Procedure Laterality Date  . CARDIAC CATHETERIZATION  N/A 09/26/2015   Procedure: Right/Left Heart Cath and Coronary Angiography;  Surgeon: Larey Dresser, MD;  Location: [facility] CV LAB;  Service: Cardiovascular;  Laterality: N/A;  . CATARACT EXTRACTION W/ INTRAOCULAR LENS  IMPLANT, BILATERAL Bilateral 1990's  . LEFT HEART CATHETERIZATION WITH CORONARY ANGIOGRAM N/A 03/16/2012   Procedure: LEFT HEART CATHETERIZATION WITH CORONARY ANGIOGRAM;  Surgeon: Hillary Bow, MD;  Location: [facility] CATH LAB;  Service: Cardiovascular;  Laterality: N/A;  . VAGINAL HYSTERECTOMY  1976    OB History    No data available       Home Medications    Prior to Admission medications   Medication Sig Start Date End Date Taking? Authorizing Provider  acetaminophen (TYLENOL) 500 MG tablet Take 1,000 mg by mouth every 6 (six) hours as needed for headache. For pain   Yes Historical Provider, MD  ADVAIR DISKUS 250-50 MCG/DOSE AEPB inhale 1 dose by mouth every 12 hours 09/12/16  Yes [name], MD  amLODipine (NORVASC) 10 MG tablet Take 1 tablet (10 mg total) by mouth daily. 11/29/16  Yes Asencion Partridge, MD  Ascorbic Acid (VITAMIN C PO) Take 1 tablet by mouth daily.    Yes Historical Provider, MD  atorvastatin (LIPITOR) 40 MG tablet Take 1 tablet (40 mg total) by mouth every evening. 03/08/16  Yes Oval Linsey, MD  clopidogrel (PLAVIX) 75 MG tablet take 1 tablet by mouth every evening 10/14/16  Yes [name], MD  esomeprazole (NEXIUM) 40 MG capsule take 1 capsule by mouth once daily BEFORE BREAKFAST 08/06/16  Yes Nelida Meuse III, MD  feeding supplement, ENSURE ENLIVE, (ENSURE ENLIVE) LIQD Take 237 mLs by mouth 2 (two) times daily between meals. 11/28/16  Yes Asencion Partridge, MD  fluticasone (FLONASE) 50 MCG/ACT nasal spray Place 1 spray into both nostrils daily. Patient taking differently: Place 1 spray into both nostrils daily as needed for allergies.  11/08/15 12/03/16 Yes [name], MD  glimepiride (AMARYL) 1 MG tablet take 1 tablet by mouth once daily BEFORE  BREAKFAST 08/16/16  Yes [name], MD  guaiFENesin-dextromethorphan [facility] DM) 100-10 MG/5ML syrup Take 5 mLs by mouth every 4 (four) hours as needed for cough. 10/02/16  Yes [name], MD  hydroxypropyl methylcellulose (ISOPTO TEARS) 2.5 % ophthalmic solution Place 1 drop into both eyes at bedtime.    Yes Historical Provider, MD  isosorbide mononitrate (IMDUR) 30 MG 24 hr tablet Take 30 mg by mouth daily.   Yes Historical Provider, MD  levothyroxine (SYNTHROID, LEVOTHROID) 25 MCG tablet take 1 tablet by mouth once daily 08/05/16  Yes [name], MD  loratadine (CLARITIN) 10 MG tablet Take 10 mg by mouth daily as needed for allergies, rhinitis or itching.   Yes Historical Provider, MD  LORazepam (ATIVAN) 0.5 MG tablet Take 1 tablet (0.5 mg total) by mouth at bedtime as needed for anxiety or sleep. 12/02/16  Yes Asencion Partridge, MD  metoprolol succinate (TOPROL XL) 25 MG 24 hr tablet Take  2 tablets (50 mg total) by mouth daily. 12/02/16  Yes Burgess Estelle, MD  Multiple Vitamin (MULTIVITAMIN WITH MINERALS) TABS tablet Take 1 tablet by mouth daily. 11/29/16  Yes Asencion Partridge, MD  NITROSTAT 0.4 MG SL tablet place 1 tablet under the tongue if needed every 5 minutes for chest pain for 3 doses IF NO RELIEF AFTER 3RD DOSE CALL PRESCRIBER OR 911. 09/09/16  Yes [name], MD  ondansetron (ZOFRAN) 4 MG tablet Take 1 tablet (4 mg total) by mouth every 6 (six) hours as needed for nausea. 11/28/16  Yes Asencion Partridge, MD  polycarbophil (FIBERCON) 625 MG tablet Take 1 tablet (625 mg total) by mouth daily. 11/28/16  Yes Asencion Partridge, MD  polyethylene glycol [facility] / GLYCOLAX) packet Take 17 g by mouth daily as needed. 11/28/16  Yes Asencion Partridge, MD  senna (SENOKOT) 8.6 MG TABS tablet Take 1 tablet (8.6 mg total) by mouth at bedtime as needed for mild constipation. 11/28/16  Yes Asencion Partridge, MD  sodium chloride 1 g tablet Take 1 tablet (1 g total) by mouth 2 (two) times daily with a meal. 12/03/16  Yes  Burgess Estelle, MD  valsartan (DIOVAN) 160 MG tablet Take 2 tablets (320 mg total) by mouth daily. 12/02/16  Yes Burgess Estelle, MD  venlafaxine XR (EFFEXOR-XR) 75 MG 24 hr capsule Take 1 capsule (75 mg total) by mouth daily with breakfast. 12/03/16  Yes Burgess Estelle, MD    Family History Family History  Problem Relation Age of Onset  . Colon cancer Maternal Grandmother   . Heart attack Maternal Grandmother   . COPD Father   . Other Mother     died of unknown causes in her 45's. Pt raised by grandmother.  . Pulmonary Hypertension Daughter     Social History Social History  Substance Use Topics  . Smoking status: Never Smoker  . Smokeless tobacco: Never Used  . Alcohol use No     Comment: 11/29/2016 "Last drink 1967"     Allergies   Aspirin; Nsaids; Codeine; Other; and Penicillins   Review of Systems Review of Systems  ROS 10 Systems reviewed and are negative for acute change except as noted in the HPI.     Physical Exam Updated Vital Signs BP 161/62   Pulse 89   Temp 97.3 F (36.3 C) (Axillary)   Resp 18   Ht 5\' 4"  (1.626 m)   Wt 115 lb (52.2 kg)   LMP 02/11/1975   SpO2 100%   BMI 19.74 kg/m   Physical Exam  Constitutional: She appears well-developed and well-nourished.  HENT:  Head: Normocephalic and atraumatic.  Eyes: EOM are normal. Pupils are equal, round, and reactive to light.  Neck: Neck supple.  Cardiovascular: Normal rate, regular rhythm and normal heart sounds.   No murmur heard. Pulmonary/Chest: Effort normal. No respiratory distress.  Abdominal: Soft. She exhibits no distension. There is no tenderness. There is no rebound and no guarding.  Neurological: No cranial nerve deficit.  Skin: Skin is warm and dry.  Nursing note and vitals reviewed.    ED Treatments / Results  Labs (all labs ordered are listed, but only abnormal results are displayed) Labs Reviewed  COMPREHENSIVE METABOLIC PANEL - Abnormal; Notable for the following:        Result Value   Sodium 130 (*)    Chloride 94 (*)    Glucose, Bld 146 (*)    BUN 21 (*)    Total Protein 6.4 (*)  GFR calc non Af Amer 57 (*)    All other components within normal limits  CBC - Abnormal; Notable for the following:    Hemoglobin 11.9 (*)    HCT 34.9 (*)    All other components within normal limits  CBG MONITORING, ED - Abnormal; Notable for the following:    Glucose-Capillary 150 (*)    All other components within normal limits    EKG  EKG Interpretation  Date/Time:  Tuesday December 03 2016 08:20:39 EST Ventricular Rate:  81 PR Interval:    QRS Duration: 83 QT Interval:  388 QTC Calculation: 451 R Axis:   -36 Text Interpretation:  Sinus rhythm Left axis deviation Anteroseptal infarct, old No acute changes Confirmed by Kathrynn Humble, MD, Thelma Comp [phone]) on 12/03/2016 10:20:49 AM       Radiology No results found.  Procedures Procedures (including critical care time)  Medications Ordered in ED Medications  LORazepam (ATIVAN) tablet 1 mg (1 mg Oral Given 12/03/16 0848)  zolpidem (AMBIEN) tablet 5 mg (not administered)  ondansetron (ZOFRAN) tablet 4 mg (not administered)  amLODipine (NORVASC) tablet 10 mg (not administered)  clopidogrel (PLAVIX) tablet 75 mg (not administered)  metoprolol succinate (TOPROL-XL) 24 hr tablet 50 mg (not administered)  irbesartan (AVAPRO) tablet 300 mg (not administered)  LORazepam (ATIVAN) injection 1 mg (1 mg Intravenous Given 12/03/16 0927)     Initial Impression / Assessment and Plan / ED Course  I have reviewed the triage vital signs and the nursing notes.  Pertinent labs & imaging results that were available during my care of the patient were reviewed by me and considered in my medical decision making (see chart for details).  Clinical Course as of Dec 03 1722  Tue Dec 03, 2016  1722 TTS recommends stopping hydroxyzine and then outpatient f/u. Patient and family notified of the plan. Ordering her home antiHTN meds.   [AN]    Clinical Course User Index [AN] Varney Biles, MD   Pt is medically cleared for TTS consultation. Pt appears to have been having aggressive behavior. Could be medication adjustment, could be related to new onset dementia or could be related to uncompensated or undiagnosed  Mood disorder.  Final Clinical Impressions(s) / ED Diagnoses   Final diagnoses:  Aggressive behavior    New Prescriptions Current Discharge Medication List       Varney Biles, MD 12/03/16 1725

## 2016-12-03 NOTE — ED Notes (Signed)
Placed patient on bedpan to use; family at bedside

## 2016-12-03 NOTE — ED Notes (Signed)
Patient is still to sleepy for TTS Consult

## 2016-12-03 NOTE — Care Management Note (Signed)
Case Management Note  Patient Details  Name: Christine [name] MRN: [id number] Date of Birth: 01/24/1934  Subjective/Objective:                  From home with daughter. /82 y.o.femalewith a PMH of HTN, HLD, hypothyroidism, CAD, DM2, COPD, and CKDwho presents with agitation.  Action/Plan: Follow for disposition needs.   Expected Discharge Date:   (unsure)               Expected Discharge Plan:  [place]  In-House Referral:  NA  Discharge planning Services  CM Consult  Post Acute Care Choice:  Resumption of Svcs/PTA Provider, NA Choice offered to:  NA  DME Arranged:  N/A DME Agency:  NA  HH Arranged:  NA HH Agency:  [facility]  Status of Service:  In process, will continue to follow  If discussed at Long Length of Stay Meetings, dates discussed:    Additional Comments: Pt currently active with [facility] for RN/PT services.  Resumption of care requested. Candi Leash of [facility] notified and will visit pt in ED.  No DME needs identified at this time.  Fuller Mandril, RN 12/03/2016, 10:57 AM

## 2016-12-03 NOTE — BHH Counselor (Addendum)
TTS consult ordered for this patient by EDP (Dr. Mariane Masters). Writer contacted patient's nurse Claiborne Billings) and requested the tele assessment machine to be placed in patient' room. Per Claiborne Billings, patient was medicated and not able to be aroused at this time. Sts that patient may also be difficult to assess once she is alert due to her presentation and current mental state. TTS will still make an attempt to complete her tele assesment. Writer requested patient's nurse for now to remove/cancel the TTS consult and  re-ordered once patient becomes alert enough to participate.   Writer also contacted the EDP (Dr. Mariane Masters) to make him aware that per nursing staff patient is sedated. She is unable to complete a TTS assessment. Writer also made the EDP aware that patient's TTS would need to be removed for now.   Once patient is alert enough to participate in the TTS the consult nursing staff or EDP will need to reorder the consult.

## 2016-12-03 NOTE — ED Notes (Signed)
Patient sitting up in bed rocking back and forward repeating over and over "its just not right"

## 2016-12-03 NOTE — ED Notes (Signed)
Some medications not available at this time. Family aware, and said "we will give it at home"

## 2016-12-03 NOTE — ED Notes (Signed)
Family states patient is getting more back to her normal baseline. TTS called back and will not be able to do assessment for approx. 1 hour

## 2016-12-03 NOTE — BH Assessment (Signed)
Tele Assessment Note  Pt presents voluntarily to [facility]IB family after pt became confused and combative early this am. Pt is cooperative. She doesn't have her hearing aids, so she has difficulty hearing writer's questions. Pt is oriented x 4. She reports she became upset "about something I did that I didn't mean to do." She denies [place] inpatient or outpatient hx. She reports not sleeping well. She endorses tearfulness and loss of interest. She reports she is able to complete all ADLs. Pt denies SI currently or at any time in the past. Pt denies any history of suicide attempts, or of self-mutilation. Pt denies homicidal thoughts or physical aggression. Pt denies having access to firearms. Pt denies having any legal problems at this time. Pt is calm and cooperative during assessment. Pt denies hallucinations. Pt does not appear to be responding to internal stimuli and exhibits no delusional thought. Pt's reality testing appears to be intact. Pt denies any current or past substance abuse problems. Pt does not appear to be intoxicated or in withdrawal at this time.  Collateral info provided by daughter Sallyjo Vanderkolk. Pt lives w/ another daughter. Daughter reports concern that pt's combative episode occurred soon after pt's first dose of Atarax last night. She states pt was scratching her legs, pulling off her pajamas, and pacing. She says they had to restrain pt so she wouldn't hit anyone. She reports that pt has always been capable of cooking and cleaning for herself. She says pt had been on imipramine for approx 30 yrs for depression. She sts at one point, pt came off of imipramine and developed migraines. Daughter reports pt was taken off imipramine last week while in hospital. Daughter says pt's sister died recently and pt feels guilt b/c she was in hospital and missed the funeral.   Christine [name] is an 83 y.o. female.   Diagnosis:  Major Depressive Disorder, Recurrent, Moderate  Past Medical History:   Past Medical History:  Diagnosis Date  . Anemia   . Anginal pain ([place])   . Arthritis    "back, arms, hips; hands" (11/30/2015)  . Benign hypertensive heart and kidney disease with diastolic CHF, NYHA class II and CKD stage III ([place])   . Blood transfusion 1972   "after daughter born, attempted to give me blood; couldn't give it cause my blood was cold" (09/23/2013)  . CAD (coronary artery disease)    a. LHC 08/23/11: dLM 40-50%, oRI 40%, oCFX 40%, oD1 70% (small and not amenable to PCI).  LM lesion did not appear to be flow limiting.  Medical rx was recommended;  b. Echo 08/23/11: mild LVH, EF 60-65%, grade 1 diast dysfxn, mild BAE, PASP 24;  c. 02/2012  Cath: LM 50d, LAD 50p, D1 70ost, RI 70p, RCA ok->Med Rx;  d. 01/2013 Cardiolite: EF 88, no ischemia/infarct.  . Carotid stenosis    a. dopplers 10/12:  0-39% bilat ICA;  b. 10/2012 U/S: 0-39% bilat, f/u 1 yr (10/2013).  . Chronic bronchitis   . Depression   . Diverticulosis of colon (without mention of hemorrhage)   . GERD (gastroesophageal reflux disease)   . Heart murmur, systolic    2-D echo in December 2011 showed a normal EF with grade 1 diastolic dysfunction, trivial pulmonary regurgitation and mildly elevated PA pressure at 37 mmHg probably secondary to her COPD.dynamic obstruction-mid cavity obliteration;  b. 02/2012 Echo: EF 60-65%, mild LVH, PASP 7mmHg.  Marland Kitchen History of stomach ulcers 1970's  . Hyperlipidemia   . Hypertension   .  Hypothyroidism   . Migraines    a. Next atypical symptoms in the past. Patient was started on Neurontin for possible neuropathic origin of her pain.  . Polymyalgia rheumatica ([place])   . Shortness of breath on exertion    "just related to angina >1 yr ago" (09/23/2013)  . Sinus arrhythmia   . Type II diabetes mellitus ([place])    a. On oral hypoglycemic agents.    Past Surgical History:  Procedure Laterality Date  . CARDIAC CATHETERIZATION N/A 09/26/2015   Procedure: Right/Left Heart Cath and Coronary  Angiography;  Surgeon: Larey Dresser, MD;  Location: [facility];  Service: Cardiovascular;  Laterality: N/A;  . CATARACT EXTRACTION W/ INTRAOCULAR LENS  IMPLANT, BILATERAL Bilateral 1990's  . LEFT HEART CATHETERIZATION WITH CORONARY ANGIOGRAM N/A 03/16/2012   Procedure: LEFT HEART CATHETERIZATION WITH CORONARY ANGIOGRAM;  Surgeon: Hillary Bow, MD;  Location: [facility] CATH LAB;  Service: Cardiovascular;  Laterality: N/A;  . VAGINAL HYSTERECTOMY  1976    Family History:  Family History  Problem Relation Age of Onset  . Colon cancer Maternal Grandmother   . Heart attack Maternal Grandmother   . COPD Father   . Other Mother     died of unknown causes in her 70's. Pt raised by grandmother.  . Pulmonary Hypertension Daughter     Social History:  reports that she has never smoked. She has never used smokeless tobacco. She reports that she does not drink alcohol or use drugs.  Additional Social History:  Alcohol / Drug Use Pain Medications: pt denies abuse - see pta meds list Prescriptions: pt denies abuse - see pta meds list Over the Counter: pt denies abuse - see pta meds list History of alcohol / drug use?: No history of alcohol / drug abuse Longest period of sobriety (when/how long): n/a  CIWA: CIWA-Ar BP: 161/62 Pulse Rate: 89 COWS:    PATIENT STRENGTHS: (choose at least two) Ability for insight Average or above average intelligence Communication skills Physical Health Supportive family/friends  Allergies:  Allergies  Allergen Reactions  . Aspirin Swelling and Other (See Comments)    Angioedema  . Nsaids Other (See Comments)    "interacts with heart medications"  . Codeine Nausea And Vomiting and Other (See Comments)    "makes me deathly sick" - can take with nausea medicine   . Other Other (See Comments)    MSG  . Penicillins Diarrhea    "real bad" Has patient had a PCN reaction causing immediate rash, facial/tongue/throat swelling, SOB or lightheadedness with  hypotension: Yes Has patient had a PCN reaction causing severe rash involving mucus membranes or skin necrosis: No Has patient had a PCN reaction that required hospitalization No Has patient had a PCN reaction occurring within the last 10 years: Yes If all of the above answers are "NO", then may proceed with Cephalosporin use.     Home Medications:  (Not in a hospital admission)  OB/GYN Status:  Patient's last menstrual period was 02/11/1975.  General Assessment Data Location of Assessment: [facility] ED TTS Assessment: In system Is this a Tele or Face-to-Face Assessment?: Tele Assessment Is this an Initial Assessment or a Re-assessment for this encounter?: Initial Assessment Marital status: Widowed Is patient pregnant?: No Pregnancy Status: No Living Arrangements: Children (daughter and grandson) Can pt return to current living arrangement?: Yes Admission Status: Voluntary Is patient capable of signing voluntary admission?: Yes Referral Source: Self/Family/Friend Insurance type: medicare     Crisis Care Plan Living Arrangements:  Children (daughter and grandson) Name of Psychiatrist: none Name of Therapist: none  Education Status Is patient currently in school?: No Highest grade of school patient has completed: 10  Risk to self with the past 6 months Suicidal Ideation: No Has patient been a risk to self within the past 6 months prior to admission? : No Suicidal Intent: No Has patient had any suicidal intent within the past 6 months prior to admission? : No Is patient at risk for suicide?: No Suicidal Plan?: No Has patient had any suicidal plan within the past 6 months prior to admission? : No Access to Means: No What has been your use of drugs/alcohol within the last 12 months?: none Previous Attempts/Gestures: No How many times?: 0 Other Self Harm Risks: none Triggers for Past Attempts:  (n/a) Intentional Self Injurious Behavior: None Family Suicide History: No Recent  stressful life event(s): Other (Comment) (pt's sister died after xmax) Persecutory voices/beliefs?: No Depression: Yes Depression Symptoms: Tearfulness, Loss of interest in usual pleasures Substance abuse history and/or treatment for substance abuse?: No Suicide prevention information given to non-admitted patients: Not applicable  Risk to Others within the past 6 months Homicidal Ideation: No Does patient have any lifetime risk of violence toward others beyond the six months prior to admission? : No Thoughts of Harm to Others: No Current Homicidal Intent: No Current Homicidal Plan: No Access to Homicidal Means: No Identified Victim: none History of harm to others?: No Assessment of Violence: None Noted Violent Behavior Description: pt was combative early this am but this is very unusual bx Does patient have access to weapons?: No Criminal Charges Pending?: No Does patient have a court date: No Is patient on probation?: No  Psychosis Hallucinations: None noted Delusions: None noted  Mental Status Report Appearance/Hygiene: Unremarkable Eye Contact: Good Motor Activity: Freedom of movement Speech: Logical/coherent (pt hard of hearing b/c doesn't have her hearing aids) Level of Consciousness: Alert Mood: Euthymic Affect: Sad, Depressed Anxiety Level: Minimal Thought Processes: Relevant, Coherent Judgement: Unimpaired Orientation: Person, Place, Time, Situation Obsessive Compulsive Thoughts/Behaviors: None  Cognitive Functioning Concentration: Normal Memory: Recent Intact, Remote Intact IQ: Average Insight: Fair Impulse Control: Fair Appetite: Fair Sleep: Decreased Total Hours of Sleep:  (unknown) Vegetative Symptoms: None  ADLScreening [facility] Assessment Services) Patient's cognitive ability adequate to safely complete daily activities?: Yes Patient able to express need for assistance with ADLs?: Yes Independently performs ADLs?: Yes (appropriate for developmental  age)  Prior Inpatient Therapy Prior Inpatient Therapy: No  Prior Outpatient Therapy Prior Outpatient Therapy: No Does patient have an ACCT team?: No Does patient have Intensive In-House Services?  : No Does patient have [facility] services? : No Does patient have P4CC services?: No  ADL Screening (condition at time of admission) Patient's cognitive ability adequate to safely complete daily activities?: Yes Is the patient deaf or have difficulty hearing?: Yes (didn't have hearing aids with her in hospital) Does the patient have difficulty seeing, even when wearing glasses/contacts?: Yes Does the patient have difficulty concentrating, remembering, or making decisions?: No Patient able to express need for assistance with ADLs?: Yes Does the patient have difficulty dressing or bathing?: No Independently performs ADLs?: Yes (appropriate for developmental age) Does the patient have difficulty walking or climbing stairs?: No Weakness of Legs: Both Weakness of Arms/Hands: Both  Home Assistive Devices/Equipment Home Assistive Devices/Equipment: Hearing aid, CBG Meter, Eyeglasses    Abuse/Neglect Assessment (Assessment to be complete while patient is alone) Physical Abuse: Denies Verbal Abuse: Denies Sexual Abuse: Denies Exploitation  of patient/patient's resources: Denies Self-Neglect: Denies   Consults Social Work Consult Needed: No Regulatory affairs officer (For Healthcare) Does Patient Have a Catering manager?: No Would patient like information on creating a medical advance directive?: No - Patient declined    Additional Information 1:1 In Past 12 Months?: No CIRT Risk: No Elopement Risk: No Does patient have medical clearance?: Yes     Disposition:  Disposition Initial Assessment Completed for this Encounter: Yes Disposition of Patient: Outpatient treatment Type of outpatient treatment:  (laurie parks np recommends d/c with follow up with psychiatr)   Writer ran pt by  Jinny Blossom NP. Parks recommends discharge with possible outpatient [facility] follow up. Writer spoke w/ EDP  And told him Romilda Garret' recommendation to discontinue Atarax. Writer will fax over contact info for psychiatrist Dr Casimiro Needle who works w/ geriatric patients.   Joash Tony P 12/03/2016 4:44 PM

## 2016-12-03 NOTE — ED Notes (Signed)
Patient is much calmer family at bedside.

## 2016-12-03 NOTE — ED Notes (Signed)
Patient resting, visitors at bedside; no needs at this time

## 2016-12-04 ENCOUNTER — Encounter (HOSPITAL_COMMUNITY): Payer: Self-pay

## 2016-12-04 ENCOUNTER — Ambulatory Visit: Payer: Medicare Other

## 2016-12-04 ENCOUNTER — Inpatient Hospital Stay (HOSPITAL_COMMUNITY)
Admission: EM | Admit: 2016-12-04 | Discharge: 2016-12-13 | DRG: 896 | Disposition: A | Discharge status: Home Health | Payer: Medicare Other | Attending: Internal Medicine | Admitting: Internal Medicine

## 2016-12-04 DIAGNOSIS — I152 Hypertension secondary to endocrine disorders: Secondary | ICD-10-CM | POA: Diagnosis present

## 2016-12-04 DIAGNOSIS — R4182 Altered mental status, unspecified: Secondary | ICD-10-CM | POA: Diagnosis present

## 2016-12-04 DIAGNOSIS — F4320 Adjustment disorder, unspecified: Secondary | ICD-10-CM | POA: Diagnosis present

## 2016-12-04 DIAGNOSIS — I2510 Atherosclerotic heart disease of native coronary artery without angina pectoris: Secondary | ICD-10-CM | POA: Diagnosis present

## 2016-12-04 DIAGNOSIS — R05 Cough: Secondary | ICD-10-CM

## 2016-12-04 DIAGNOSIS — T434X5A Adverse effect of butyrophenone and thiothixene neuroleptics, initial encounter: Secondary | ICD-10-CM | POA: Diagnosis not present

## 2016-12-04 DIAGNOSIS — N179 Acute kidney failure, unspecified: Secondary | ICD-10-CM

## 2016-12-04 DIAGNOSIS — N183 Chronic kidney disease, stage 3 (moderate): Secondary | ICD-10-CM | POA: Diagnosis present

## 2016-12-04 DIAGNOSIS — I160 Hypertensive urgency: Secondary | ICD-10-CM | POA: Diagnosis present

## 2016-12-04 DIAGNOSIS — F19931 Other psychoactive substance use, unspecified with withdrawal delirium: Principal | ICD-10-CM | POA: Diagnosis present

## 2016-12-04 DIAGNOSIS — Z681 Body mass index (BMI) 19 or less, adult: Secondary | ICD-10-CM

## 2016-12-04 DIAGNOSIS — E43 Unspecified severe protein-calorie malnutrition: Secondary | ICD-10-CM | POA: Diagnosis present

## 2016-12-04 DIAGNOSIS — J449 Chronic obstructive pulmonary disease, unspecified: Secondary | ICD-10-CM | POA: Diagnosis present

## 2016-12-04 DIAGNOSIS — I10 Essential (primary) hypertension: Secondary | ICD-10-CM

## 2016-12-04 DIAGNOSIS — G2579 Other drug induced movement disorders: Secondary | ICD-10-CM | POA: Diagnosis not present

## 2016-12-04 DIAGNOSIS — Z7951 Long term (current) use of inhaled steroids: Secondary | ICD-10-CM

## 2016-12-04 DIAGNOSIS — T428X5A Adverse effect of antiparkinsonism drugs and other central muscle-tone depressants, initial encounter: Secondary | ICD-10-CM | POA: Diagnosis not present

## 2016-12-04 DIAGNOSIS — K219 Gastro-esophageal reflux disease without esophagitis: Secondary | ICD-10-CM | POA: Diagnosis present

## 2016-12-04 DIAGNOSIS — E785 Hyperlipidemia, unspecified: Secondary | ICD-10-CM

## 2016-12-04 DIAGNOSIS — N189 Chronic kidney disease, unspecified: Secondary | ICD-10-CM | POA: Diagnosis present

## 2016-12-04 DIAGNOSIS — F329 Major depressive disorder, single episode, unspecified: Secondary | ICD-10-CM | POA: Diagnosis present

## 2016-12-04 DIAGNOSIS — Z79899 Other long term (current) drug therapy: Secondary | ICD-10-CM

## 2016-12-04 DIAGNOSIS — I129 Hypertensive chronic kidney disease with stage 1 through stage 4 chronic kidney disease, or unspecified chronic kidney disease: Secondary | ICD-10-CM | POA: Diagnosis present

## 2016-12-04 DIAGNOSIS — R197 Diarrhea, unspecified: Secondary | ICD-10-CM | POA: Diagnosis present

## 2016-12-04 DIAGNOSIS — E440 Moderate protein-calorie malnutrition: Secondary | ICD-10-CM | POA: Diagnosis present

## 2016-12-04 DIAGNOSIS — R258 Other abnormal involuntary movements: Secondary | ICD-10-CM

## 2016-12-04 DIAGNOSIS — I952 Hypotension due to drugs: Secondary | ICD-10-CM | POA: Diagnosis not present

## 2016-12-04 DIAGNOSIS — E860 Dehydration: Secondary | ICD-10-CM | POA: Diagnosis not present

## 2016-12-04 DIAGNOSIS — R410 Disorientation, unspecified: Secondary | ICD-10-CM

## 2016-12-04 DIAGNOSIS — R451 Restlessness and agitation: Secondary | ICD-10-CM | POA: Diagnosis present

## 2016-12-04 DIAGNOSIS — F419 Anxiety disorder, unspecified: Secondary | ICD-10-CM | POA: Diagnosis present

## 2016-12-04 DIAGNOSIS — G4700 Insomnia, unspecified: Secondary | ICD-10-CM | POA: Diagnosis not present

## 2016-12-04 DIAGNOSIS — G9340 Encephalopathy, unspecified: Secondary | ICD-10-CM | POA: Diagnosis present

## 2016-12-04 DIAGNOSIS — E039 Hypothyroidism, unspecified: Secondary | ICD-10-CM | POA: Diagnosis present

## 2016-12-04 DIAGNOSIS — E1122 Type 2 diabetes mellitus with diabetic chronic kidney disease: Secondary | ICD-10-CM | POA: Diagnosis present

## 2016-12-04 DIAGNOSIS — E119 Type 2 diabetes mellitus without complications: Secondary | ICD-10-CM

## 2016-12-04 DIAGNOSIS — R058 Other specified cough: Secondary | ICD-10-CM

## 2016-12-04 DIAGNOSIS — E871 Hypo-osmolality and hyponatremia: Secondary | ICD-10-CM | POA: Diagnosis present

## 2016-12-04 DIAGNOSIS — Z7902 Long term (current) use of antithrombotics/antiplatelets: Secondary | ICD-10-CM

## 2016-12-04 DIAGNOSIS — Z7984 Long term (current) use of oral hypoglycemic drugs: Secondary | ICD-10-CM

## 2016-12-04 LAB — BASIC METABOLIC PANEL
Anion gap: 15 (ref 5–15)
BUN: 17 mg/dL (ref 6–20)
CHLORIDE: 93 mmol/L — AB (ref 101–111)
CO2: 24 mmol/L (ref 22–32)
CREATININE: 0.74 mg/dL (ref 0.44–1.00)
Calcium: 10.3 mg/dL (ref 8.9–10.3)
GFR calc Af Amer: >60 mL/min (ref >60)
GFR calc non Af Amer: >60 mL/min (ref >60)
Glucose, Bld: 178 mg/dL — ABNORMAL HIGH (ref 65–99)
POTASSIUM: 5 mmol/L (ref 3.5–5.1)
Sodium: 132 mmol/L — ABNORMAL LOW (ref 135–145)

## 2016-12-04 LAB — CBC WITH DIFFERENTIAL/PLATELET
BASOS PCT: 0 %
Basophils Absolute: 0 10*3/uL (ref 0.0–0.1)
Eosinophils Absolute: 0.1 10*3/uL (ref 0.0–0.7)
Eosinophils Relative: 1 %
HEMATOCRIT: 36.4 % (ref 36.0–46.0)
HEMOGLOBIN: 12.2 g/dL (ref 12.0–15.0)
LYMPHS ABS: 2 10*3/uL (ref 0.7–4.0)
Lymphocytes Relative: 19 %
MCH: 29.8 pg (ref 26.0–34.0)
MCHC: 33.5 g/dL (ref 30.0–36.0)
MCV: 89 fL (ref 78.0–100.0)
MONOS PCT: 8 %
Monocytes Absolute: 0.8 10*3/uL (ref 0.1–1.0)
NEUTROS ABS: 7.3 10*3/uL (ref 1.7–7.7)
NEUTROS PCT: 72 %
Platelets: 356 10*3/uL (ref 150–400)
RBC: 4.09 MIL/uL (ref 3.87–5.11)
RDW: 14.5 % (ref 11.5–15.5)
WBC: 10.2 10*3/uL (ref 4.0–10.5)

## 2016-12-04 MED ORDER — HALOPERIDOL LACTATE 5 MG/ML IJ SOLN
5.0000 mg | Freq: Once | INTRAMUSCULAR | Status: AC
  Administered 2016-12-04: 5 mg via INTRAVENOUS
  Filled 2016-12-04: qty 1

## 2016-12-04 MED ORDER — SODIUM CHLORIDE 0.9 % IV SOLN
Freq: Once | INTRAVENOUS | Status: AC
  Administered 2016-12-05: 02:00:00 via INTRAVENOUS

## 2016-12-04 NOTE — ED Triage Notes (Signed)
Pt BIB [facility] from home where family states that pt was having a good day and around 1700 report pt "became less talkative and not acting like herself".

## 2016-12-04 NOTE — ED Provider Notes (Signed)
[facility] DEPT Provider Note   CSN: [id number] Arrival date & time: 12/04/16  2011     History   Chief Complaint Chief Complaint  Patient presents with  . Altered Mental Status    HPI Christine [name] is a 83 y.o. female.  HPI 83 year old female sitting with altered mental status and diarrhea. The granddaughter is her caretaker is with her. She states that she's had 5 episodes of watery nonbloody diarrhea at home. She then complained of lightheadedness. The granddaughter states that she then started staring off and was not acting like her normal. She is admitted recently for hyponatremia and has returned to the emergency department multiple times for altered mental status and agitation. She denies any fevers, chest pain, shortness of breath, nausea, vomiting, abdominal pain.   Past Medical History:  Diagnosis Date  . Anemia   . Anginal pain ([place])   . Arthritis    "back, arms, hips; hands" (11/30/2015)  . Benign hypertensive heart and kidney disease with diastolic CHF, NYHA class II and CKD stage III ([place])   . Blood transfusion 1972   "after daughter born, attempted to give me blood; couldn't give it cause my blood was cold" (09/23/2013)  . CAD (coronary artery disease)    a. LHC 08/23/11: dLM 40-50%, oRI 40%, oCFX 40%, oD1 70% (small and not amenable to PCI).  LM lesion did not appear to be flow limiting.  Medical rx was recommended;  b. Echo 08/23/11: mild LVH, EF 60-65%, grade 1 diast dysfxn, mild BAE, PASP 24;  c. 02/2012  Cath: LM 50d, LAD 50p, D1 70ost, RI 70p, RCA ok->Med Rx;  d. 01/2013 Cardiolite: EF 88, no ischemia/infarct.  . Carotid stenosis    a. dopplers 10/12:  0-39% bilat ICA;  b. 10/2012 U/S: 0-39% bilat, f/u 1 yr (10/2013).  . Chronic bronchitis   . Depression   . Diverticulosis of colon (without mention of hemorrhage)   . GERD (gastroesophageal reflux disease)   . Heart murmur, systolic    2-D echo in December 2011 showed a normal EF with grade 1 diastolic  dysfunction, trivial pulmonary regurgitation and mildly elevated PA pressure at 37 mmHg probably secondary to her COPD.dynamic obstruction-mid cavity obliteration;  b. 02/2012 Echo: EF 60-65%, mild LVH, PASP 1mmHg.  Marland Kitchen History of stomach ulcers 1970's  . Hyperlipidemia   . Hypertension   . Hypothyroidism   . Migraines    a. Next atypical symptoms in the past. Patient was started on Neurontin for possible neuropathic origin of her pain.  . Polymyalgia rheumatica ([place])   . Shortness of breath on exertion    "just related to angina >1 yr ago" (09/23/2013)  . Sinus arrhythmia   . Type II diabetes mellitus ([place])    a. On oral hypoglycemic agents.    Patient Active Problem List   Diagnosis Date Noted  . Agitation 12/05/2016  . Dysarthria 12/01/2016  . MDD (major depressive disorder), recurrent severe, without psychosis ([place]) 11/30/2016  . Protein-calorie malnutrition, severe 11/30/2016  . Hypertensive urgency 11/27/2016  . Malnutrition of moderate degree 11/27/2016  . Altered mental status   . Appetite loss   . Constipation   . Hyponatremia 11/26/2016  . Elevated alkaline phosphatase level 09/18/2016  . Mixed incontinence urge and stress 05/09/2016  . Dizziness 04/11/2016  . Exertional dyspnea 09/11/2015  . Hypertensive retinopathy of both eyes, grade 2 04/20/2015  . Lumbar spondylosis 06/16/2014  . Carpal tunnel syndrome 02/18/2014  . Right shoulder pain 02/02/2014  .  Osteopenia 01/11/2013  . Routine adult health maintenance 01/11/2013  . Chronic renal insufficiency, stage III (moderate) 09/21/2012  . Recurrent cough 09/18/2012  . Diverticulosis 09/18/2012  . Allergic rhinitis 02/13/2012  . Carotid stenosis 09/25/2011  . Coronary artery disease 08/29/2011  . HEART MURMUR, SYSTOLIC [id number]  . Enlargement of clavicle 03/30/2008  . Solitary pulmonary nodule 12/15/2006  . Hyperlipidemia 11/28/2006  . Hypothyroidism 09/04/2006  . Type 2 diabetes mellitus, controlled ([place])  09/04/2006  . Migraine 09/04/2006  . Essential hypertension 09/04/2006  . GERD 09/04/2006    Past Surgical History:  Procedure Laterality Date  . CARDIAC CATHETERIZATION N/A 09/26/2015   Procedure: Right/Left Heart Cath and Coronary Angiography;  Surgeon: Larey Dresser, MD;  Location: [facility];  Service: Cardiovascular;  Laterality: N/A;  . CATARACT EXTRACTION W/ INTRAOCULAR LENS  IMPLANT, BILATERAL Bilateral 1990's  . LEFT HEART CATHETERIZATION WITH CORONARY ANGIOGRAM N/A 03/16/2012   Procedure: LEFT HEART CATHETERIZATION WITH CORONARY ANGIOGRAM;  Surgeon: Hillary Bow, MD;  Location: [facility] LAB;  Service: Cardiovascular;  Laterality: N/A;  . VAGINAL HYSTERECTOMY  1976    OB History    No data available       Home Medications    Prior to Admission medications   Medication Sig Start Date End Date Taking? Authorizing Provider  acetaminophen (TYLENOL) 500 MG tablet Take 1,000 mg by mouth every 6 (six) hours as needed for headache. For pain    Historical Provider, MD  ADVAIR DISKUS 250-50 MCG/DOSE AEPB inhale 1 dose by mouth every 12 hours 09/12/16   [name], MD  amLODipine (NORVASC) 10 MG tablet Take 1 tablet (10 mg total) by mouth daily. 11/29/16   Asencion Partridge, MD  Ascorbic Acid (VITAMIN C PO) Take 1 tablet by mouth daily.     Historical Provider, MD  atorvastatin (LIPITOR) 40 MG tablet Take 1 tablet (40 mg total) by mouth every evening. 03/08/16   Oval Linsey, MD  clopidogrel (PLAVIX) 75 MG tablet take 1 tablet by mouth every evening 10/14/16   [name], MD  esomeprazole (NEXIUM) 40 MG capsule take 1 capsule by mouth once daily BEFORE BREAKFAST 08/06/16   Nelida Meuse III, MD  feeding supplement, ENSURE ENLIVE, (ENSURE ENLIVE) LIQD Take 237 mLs by mouth 2 (two) times daily between meals. 11/28/16   Asencion Partridge, MD  fluticasone (FLONASE) 50 MCG/ACT nasal spray Place 1 spray into both nostrils daily. Patient taking differently: Place 1 spray into both  nostrils daily as needed for allergies.  11/08/15 12/03/16  [name], MD  glimepiride (AMARYL) 1 MG tablet take 1 tablet by mouth once daily BEFORE BREAKFAST 08/16/16   [name], MD  guaiFENesin-dextromethorphan [facility] DM) 100-10 MG/5ML syrup Take 5 mLs by mouth every 4 (four) hours as needed for cough. 10/02/16   [name], MD  hydroxypropyl methylcellulose (ISOPTO TEARS) 2.5 % ophthalmic solution Place 1 drop into both eyes at bedtime.     Historical Provider, MD  isosorbide mononitrate (IMDUR) 30 MG 24 hr tablet Take 30 mg by mouth daily.    Historical Provider, MD  levothyroxine (SYNTHROID, LEVOTHROID) 25 MCG tablet take 1 tablet by mouth once daily 08/05/16   [name], MD  loratadine (CLARITIN) 10 MG tablet Take 10 mg by mouth daily as needed for allergies, rhinitis or itching.    Historical Provider, MD  LORazepam (ATIVAN) 0.5 MG tablet Take 1 tablet (0.5 mg total) by mouth at bedtime as needed for  anxiety or sleep. 12/03/16   Leo Grosser, MD  metoprolol succinate (TOPROL XL) 25 MG 24 hr tablet Take 2 tablets (50 mg total) by mouth daily. 12/02/16   Burgess Estelle, MD  Multiple Vitamin (MULTIVITAMIN WITH MINERALS) TABS tablet Take 1 tablet by mouth daily. 11/29/16   Asencion Partridge, MD  NITROSTAT 0.4 MG SL tablet place 1 tablet under the tongue if needed every 5 minutes for chest pain for 3 doses IF NO RELIEF AFTER 3RD DOSE CALL PRESCRIBER OR 911. 09/09/16   [name], MD  ondansetron (ZOFRAN) 4 MG tablet Take 1 tablet (4 mg total) by mouth every 6 (six) hours as needed for nausea. 11/28/16   Asencion Partridge, MD  polycarbophil (FIBERCON) 625 MG tablet Take 1 tablet (625 mg total) by mouth daily. 11/28/16   Asencion Partridge, MD  polyethylene glycol [facility] Floria Raveling) packet Take 17 g by mouth daily as needed. 11/28/16   Asencion Partridge, MD  senna (SENOKOT) 8.6 MG TABS tablet Take 1 tablet (8.6 mg total) by mouth at bedtime as needed for mild constipation. 11/28/16   Asencion Partridge, MD    sodium chloride 1 g tablet Take 1 tablet (1 g total) by mouth 2 (two) times daily with a meal. 12/03/16   Burgess Estelle, MD  valsartan (DIOVAN) 160 MG tablet Take 2 tablets (320 mg total) by mouth daily. 12/02/16   Burgess Estelle, MD  venlafaxine XR (EFFEXOR-XR) 75 MG 24 hr capsule Take 1 capsule (75 mg total) by mouth daily with breakfast. 12/03/16   Burgess Estelle, MD    Family History Family History  Problem Relation Age of Onset  . Colon cancer Maternal Grandmother   . Heart attack Maternal Grandmother   . COPD Father   . Other Mother     died of unknown causes in her 19's. Pt raised by grandmother.  . Pulmonary Hypertension Daughter     Social History Social History  Substance Use Topics  . Smoking status: Never Smoker  . Smokeless tobacco: Never Used  . Alcohol use No     Comment: 11/29/2016 "Last drink 1967"     Allergies   Aspirin; Nsaids; Codeine; Other; and Penicillins   Review of Systems Review of Systems  Constitutional: Positive for activity change. Negative for fever.  HENT: Negative for congestion, rhinorrhea and sore throat.   Eyes: Negative.   Respiratory: Negative for cough, chest tightness, shortness of breath and wheezing.   Cardiovascular: Negative for chest pain and palpitations.  Gastrointestinal: Positive for diarrhea. Negative for abdominal distention, nausea and vomiting.  Genitourinary: Negative for difficulty urinating and dysuria.  Musculoskeletal: Negative for back pain and neck pain.  Skin: Negative.   Neurological: Positive for light-headedness. Negative for dizziness, weakness and headaches.  Psychiatric/Behavioral: Positive for behavioral problems and decreased concentration.     Physical Exam Updated Vital Signs BP (!) 117/50   Pulse 68   Temp 97.9 F (36.6 C) (Oral)   Resp 17   LMP 02/11/1975   SpO2 100%   Physical Exam  Constitutional: She is oriented to person, place, and time. She appears cachectic.  HENT:  Head:  Normocephalic and atraumatic.  Mouth/Throat: Uvula is midline, oropharynx is clear and moist and mucous membranes are normal.  Eyes: Conjunctivae and EOM are normal. Pupils are equal, round, and reactive to light.  Neck: Trachea normal, normal range of motion and phonation normal. Neck supple.  Cardiovascular: Normal rate, regular rhythm, S1 normal, S2 normal, normal heart sounds, intact distal pulses  and normal pulses.   No murmur heard. Pulmonary/Chest: Effort normal and breath sounds normal. No respiratory distress. She has no decreased breath sounds. She has no wheezes. She has no rhonchi.  Abdominal: Soft. She exhibits no distension. There is no tenderness. There is no tenderness at McBurney's point and negative Murphy's sign.  Neurological: She is alert and oriented to person, place, and time. She has normal strength. No cranial nerve deficit or sensory deficit. She displays a negative Romberg sign. Gait normal. GCS eye subscore is 4. GCS verbal subscore is 5. GCS motor subscore is 6.  Skin: Capillary refill takes less than 2 seconds.  Psychiatric: Her affect is blunt.  Nursing note and vitals reviewed.    ED Treatments / Results  Labs (all labs ordered are listed, but only abnormal results are displayed) Labs Reviewed  BASIC METABOLIC PANEL - Abnormal; Notable for the following:       Result Value   Sodium 132 (*)    Chloride 93 (*)    Glucose, Bld 178 (*)    All other components within normal limits  C DIFFICILE QUICK SCREEN W PCR REFLEX  CBC WITH DIFFERENTIAL/PLATELET  URINALYSIS, ROUTINE W REFLEX MICROSCOPIC    EKG  EKG Interpretation  Date/Time:  Wednesday December 04 2016 20:16:20 EST Ventricular Rate:  79 PR Interval:    QRS Duration: 82 QT Interval:  375 QTC Calculation: 430 R Axis:   -22 Text Interpretation:  Sinus rhythm Atrial premature complex Borderline left axis deviation Anterior infarct, old No significant change since last tracing Confirmed by FLOYD MD,  DANIEL [phone]) on 12/04/2016 8:38:40 PM       Radiology No results found.  Procedures Procedures (including critical care time)  Medications Ordered in ED Medications  0.9 %  sodium chloride infusion (not administered)  haloperidol lactate (HALDOL) injection 5 mg (5 mg Intravenous Given 12/04/16 2234)     Initial Impression / Assessment and Plan / ED Course  I have reviewed the triage vital signs and the nursing notes.  Pertinent labs & imaging results that were available during my care of the patient were reviewed by me and considered in my medical decision making (see chart for details).  Clinical Course    83 year old female presenting with multiple episodes of diarrhea and altered mental status. She stays with her grand daughter who is with her. She states that she has had 5 episodes of watery nonbloody diarrhea and then the patient became somewhat somnolent. She still alert however seem to stare off into space and was lightheaded. Concern for hyponatremia. BMP ordered. While waiting for the BMP to result patient became very agitated and started shouting "no" repeated and slapping her hands on the bed when you grab her hand and speak with her she breaks out of the repeated nature and communicates however quickly goes back. Doubt seizures is able to be calmed but was refusing to answer my questions and only prayed shortly after this she became more agitated and tried to get out of bed and leave. IV Haldol given and the patient is now more cooperative and less agitated. BMP notable for a sodium of 132. Patient has had multiple episodes of watery diarrhea while in the emergency department. C. difficile sent. Urinalysis ordered for altered mental status and CT head ordered. Patient discussed with the internal medicine team and she will be admitted for diarrhea, rehydration, altered mental status as she has been to the ED several times for the same complaint.  Final Clinical  Impressions(s) / ED Diagnoses   Final diagnoses:  None    New Prescriptions New Prescriptions   No medications on file     Kilian Schwartz [place] Janis Cuffe, MD 12/05/16 [place], DO 12/05/16 2303

## 2016-12-04 NOTE — ED Notes (Signed)
Delay in lab draw,  Pt not in room 

## 2016-12-04 NOTE — ED Notes (Signed)
Pt ambulated to restroom with family. NAD.

## 2016-12-04 NOTE — ED Notes (Signed)
ED Provider at bedside. 

## 2016-12-04 NOTE — ED Notes (Signed)
Pt family member came out of room stating they needed help. Pt shaking hands and stating "no" repeatedly. MD made aware and went in the room with this RN to comfort and calm pt. Pt quoting scripture with granddaughter and calmed. Lights dimmed.

## 2016-12-04 NOTE — ED Notes (Signed)
Pt was resting and woke up screaming she needed to go and shaking her arms. Pt attempting to crawl out of bed. MD made aware.

## 2016-12-05 ENCOUNTER — Observation Stay (HOSPITAL_COMMUNITY): Payer: Medicare Other

## 2016-12-05 DIAGNOSIS — F19931 Other psychoactive substance use, unspecified with withdrawal delirium: Secondary | ICD-10-CM | POA: Diagnosis present

## 2016-12-05 DIAGNOSIS — Z825 Family history of asthma and other chronic lower respiratory diseases: Secondary | ICD-10-CM

## 2016-12-05 DIAGNOSIS — R451 Restlessness and agitation: Secondary | ICD-10-CM | POA: Diagnosis present

## 2016-12-05 DIAGNOSIS — I129 Hypertensive chronic kidney disease with stage 1 through stage 4 chronic kidney disease, or unspecified chronic kidney disease: Secondary | ICD-10-CM | POA: Diagnosis not present

## 2016-12-05 DIAGNOSIS — Z794 Long term (current) use of insulin: Secondary | ICD-10-CM | POA: Diagnosis not present

## 2016-12-05 DIAGNOSIS — R112 Nausea with vomiting, unspecified: Secondary | ICD-10-CM | POA: Diagnosis not present

## 2016-12-05 DIAGNOSIS — N183 Chronic kidney disease, stage 3 (moderate): Secondary | ICD-10-CM | POA: Diagnosis not present

## 2016-12-05 DIAGNOSIS — J449 Chronic obstructive pulmonary disease, unspecified: Secondary | ICD-10-CM

## 2016-12-05 DIAGNOSIS — R05 Cough: Secondary | ICD-10-CM | POA: Diagnosis not present

## 2016-12-05 DIAGNOSIS — T434X5A Adverse effect of butyrophenone and thiothixene neuroleptics, initial encounter: Secondary | ICD-10-CM | POA: Diagnosis not present

## 2016-12-05 DIAGNOSIS — K219 Gastro-esophageal reflux disease without esophagitis: Secondary | ICD-10-CM

## 2016-12-05 DIAGNOSIS — E039 Hypothyroidism, unspecified: Secondary | ICD-10-CM

## 2016-12-05 DIAGNOSIS — I952 Hypotension due to drugs: Secondary | ICD-10-CM | POA: Diagnosis not present

## 2016-12-05 DIAGNOSIS — Z885 Allergy status to narcotic agent status: Secondary | ICD-10-CM

## 2016-12-05 DIAGNOSIS — E1122 Type 2 diabetes mellitus with diabetic chronic kidney disease: Secondary | ICD-10-CM | POA: Diagnosis not present

## 2016-12-05 DIAGNOSIS — E785 Hyperlipidemia, unspecified: Secondary | ICD-10-CM

## 2016-12-05 DIAGNOSIS — E871 Hypo-osmolality and hyponatremia: Secondary | ICD-10-CM | POA: Diagnosis not present

## 2016-12-05 DIAGNOSIS — K582 Mixed irritable bowel syndrome: Secondary | ICD-10-CM | POA: Diagnosis not present

## 2016-12-05 DIAGNOSIS — Z79899 Other long term (current) drug therapy: Secondary | ICD-10-CM | POA: Diagnosis not present

## 2016-12-05 DIAGNOSIS — F419 Anxiety disorder, unspecified: Secondary | ICD-10-CM | POA: Diagnosis present

## 2016-12-05 DIAGNOSIS — R410 Disorientation, unspecified: Secondary | ICD-10-CM | POA: Diagnosis not present

## 2016-12-05 DIAGNOSIS — I5032 Chronic diastolic (congestive) heart failure: Secondary | ICD-10-CM

## 2016-12-05 DIAGNOSIS — R197 Diarrhea, unspecified: Secondary | ICD-10-CM

## 2016-12-05 DIAGNOSIS — E119 Type 2 diabetes mellitus without complications: Secondary | ICD-10-CM | POA: Diagnosis not present

## 2016-12-05 DIAGNOSIS — R258 Other abnormal involuntary movements: Secondary | ICD-10-CM | POA: Diagnosis not present

## 2016-12-05 DIAGNOSIS — I2510 Atherosclerotic heart disease of native coronary artery without angina pectoris: Secondary | ICD-10-CM | POA: Diagnosis not present

## 2016-12-05 DIAGNOSIS — F4320 Adjustment disorder, unspecified: Secondary | ICD-10-CM

## 2016-12-05 DIAGNOSIS — Z7951 Long term (current) use of inhaled steroids: Secondary | ICD-10-CM

## 2016-12-05 DIAGNOSIS — I951 Orthostatic hypotension: Secondary | ICD-10-CM | POA: Diagnosis not present

## 2016-12-05 DIAGNOSIS — G2111 Neuroleptic induced parkinsonism: Secondary | ICD-10-CM | POA: Diagnosis not present

## 2016-12-05 DIAGNOSIS — N179 Acute kidney failure, unspecified: Secondary | ICD-10-CM | POA: Diagnosis not present

## 2016-12-05 DIAGNOSIS — Z7902 Long term (current) use of antithrombotics/antiplatelets: Secondary | ICD-10-CM | POA: Diagnosis not present

## 2016-12-05 DIAGNOSIS — R4789 Other speech disturbances: Secondary | ICD-10-CM | POA: Diagnosis not present

## 2016-12-05 DIAGNOSIS — E1165 Type 2 diabetes mellitus with hyperglycemia: Secondary | ICD-10-CM

## 2016-12-05 DIAGNOSIS — E43 Unspecified severe protein-calorie malnutrition: Secondary | ICD-10-CM | POA: Diagnosis not present

## 2016-12-05 DIAGNOSIS — F329 Major depressive disorder, single episode, unspecified: Secondary | ICD-10-CM | POA: Diagnosis not present

## 2016-12-05 DIAGNOSIS — I160 Hypertensive urgency: Secondary | ICD-10-CM | POA: Diagnosis not present

## 2016-12-05 DIAGNOSIS — Z886 Allergy status to analgesic agent status: Secondary | ICD-10-CM

## 2016-12-05 DIAGNOSIS — Z681 Body mass index (BMI) 19 or less, adult: Secondary | ICD-10-CM | POA: Diagnosis not present

## 2016-12-05 DIAGNOSIS — G9340 Encephalopathy, unspecified: Secondary | ICD-10-CM | POA: Diagnosis present

## 2016-12-05 DIAGNOSIS — G4700 Insomnia, unspecified: Secondary | ICD-10-CM | POA: Diagnosis not present

## 2016-12-05 DIAGNOSIS — I10 Essential (primary) hypertension: Secondary | ICD-10-CM | POA: Diagnosis not present

## 2016-12-05 DIAGNOSIS — R0989 Other specified symptoms and signs involving the circulatory and respiratory systems: Secondary | ICD-10-CM | POA: Diagnosis not present

## 2016-12-05 DIAGNOSIS — T43015A Adverse effect of tricyclic antidepressants, initial encounter: Secondary | ICD-10-CM | POA: Diagnosis not present

## 2016-12-05 DIAGNOSIS — E860 Dehydration: Secondary | ICD-10-CM | POA: Diagnosis not present

## 2016-12-05 DIAGNOSIS — G2579 Other drug induced movement disorders: Secondary | ICD-10-CM | POA: Diagnosis not present

## 2016-12-05 DIAGNOSIS — R4182 Altered mental status, unspecified: Secondary | ICD-10-CM | POA: Diagnosis not present

## 2016-12-05 DIAGNOSIS — Z9889 Other specified postprocedural states: Secondary | ICD-10-CM | POA: Diagnosis not present

## 2016-12-05 LAB — GLUCOSE, CAPILLARY
GLUCOSE-CAPILLARY: 140 mg/dL — AB (ref 65–99)
GLUCOSE-CAPILLARY: 156 mg/dL — AB (ref 65–99)
GLUCOSE-CAPILLARY: 180 mg/dL — AB (ref 65–99)
GLUCOSE-CAPILLARY: 117 mg/dL — AB (ref 65–99)
Glucose-Capillary: 152 mg/dL — ABNORMAL HIGH (ref 65–99)
Glucose-Capillary: 186 mg/dL — ABNORMAL HIGH (ref 65–99)

## 2016-12-05 LAB — BASIC METABOLIC PANEL
Anion gap: 9 (ref 5–15)
BUN: 12 mg/dL (ref 6–20)
CO2: 24 mmol/L (ref 22–32)
Calcium: 9.6 mg/dL (ref 8.9–10.3)
Chloride: 99 mmol/L — ABNORMAL LOW (ref 101–111)
Creatinine, Ser: 0.73 mg/dL (ref 0.44–1.00)
GFR calc Af Amer: >60 mL/min (ref >60)
GFR calc non Af Amer: >60 mL/min (ref >60)
Glucose, Bld: 161 mg/dL — ABNORMAL HIGH (ref 65–99)
Potassium: 3.9 mmol/L (ref 3.5–5.1)
Sodium: 132 mmol/L — ABNORMAL LOW (ref 135–145)

## 2016-12-05 LAB — C DIFFICILE QUICK SCREEN W PCR REFLEX
C Diff antigen: NEGATIVE
C Diff interpretation: NOT DETECTED
C Diff toxin: NEGATIVE

## 2016-12-05 LAB — RPR: RPR Ser Ql: NONREACTIVE

## 2016-12-05 LAB — RAPID URINE DRUG SCREEN, HOSP PERFORMED
AMPHETAMINES: NOT DETECTED
Barbiturates: NOT DETECTED
Benzodiazepines: POSITIVE — AB
Cocaine: NOT DETECTED
OPIATES: NOT DETECTED
Tetrahydrocannabinol: NOT DETECTED

## 2016-12-05 LAB — URINALYSIS, ROUTINE W REFLEX MICROSCOPIC
Bilirubin Urine: NEGATIVE
Glucose, UA: NEGATIVE mg/dL
Hgb urine dipstick: NEGATIVE
KETONES UR: NEGATIVE mg/dL
LEUKOCYTES UA: NEGATIVE
NITRITE: NEGATIVE
Protein, ur: NEGATIVE mg/dL
SPECIFIC GRAVITY, URINE: 1.009 (ref 1.005–1.030)
pH: 5 (ref 5.0–8.0)

## 2016-12-05 LAB — HIV ANTIBODY (ROUTINE TESTING W REFLEX): HIV Screen 4th Generation wRfx: NONREACTIVE

## 2016-12-05 LAB — TSH: TSH: 1.955 u[IU]/mL (ref 0.350–4.500)

## 2016-12-05 LAB — AMMONIA: Ammonia: 24 umol/L (ref 9–35)

## 2016-12-05 MED ORDER — IRBESARTAN 300 MG PO TABS
300.0000 mg | ORAL_TABLET | Freq: Every day | ORAL | Status: DC
  Administered 2016-12-07 – 2016-12-08 (×2): 300 mg via ORAL
  Filled 2016-12-05 (×3): qty 1

## 2016-12-05 MED ORDER — MIRTAZAPINE 15 MG PO TABS
15.0000 mg | ORAL_TABLET | Freq: Every day | ORAL | Status: DC
  Filled 2016-12-05: qty 1

## 2016-12-05 MED ORDER — PANTOPRAZOLE SODIUM 40 MG PO TBEC
40.0000 mg | DELAYED_RELEASE_TABLET | Freq: Every day | ORAL | Status: DC
  Administered 2016-12-05 – 2016-12-13 (×8): 40 mg via ORAL
  Filled 2016-12-05 (×10): qty 1

## 2016-12-05 MED ORDER — ISOSORBIDE MONONITRATE ER 30 MG PO TB24
30.0000 mg | ORAL_TABLET | Freq: Every day | ORAL | Status: DC
  Administered 2016-12-05 – 2016-12-06 (×2): 30 mg via ORAL
  Filled 2016-12-05 (×2): qty 1

## 2016-12-05 MED ORDER — IRBESARTAN 75 MG PO TABS
150.0000 mg | ORAL_TABLET | Freq: Once | ORAL | Status: AC
  Administered 2016-12-05: 150 mg via ORAL
  Filled 2016-12-05: qty 2

## 2016-12-05 MED ORDER — IMIPRAMINE HCL 25 MG PO TABS
25.0000 mg | ORAL_TABLET | Freq: Two times a day (BID) | ORAL | Status: DC
  Administered 2016-12-05 – 2016-12-08 (×8): 25 mg via ORAL
  Filled 2016-12-05 (×9): qty 1

## 2016-12-05 MED ORDER — SODIUM CHLORIDE 0.9 % IV SOLN: INTRAVENOUS | Status: DC

## 2016-12-05 MED ORDER — INSULIN ASPART 100 UNIT/ML ~~LOC~~ SOLN
0.0000 [IU] | Freq: Three times a day (TID) | SUBCUTANEOUS | Status: DC
  Administered 2016-12-05 – 2016-12-06 (×4): 2 [IU] via SUBCUTANEOUS
  Administered 2016-12-06 (×2): 1 [IU] via SUBCUTANEOUS
  Administered 2016-12-07 – 2016-12-08 (×3): 2 [IU] via SUBCUTANEOUS
  Administered 2016-12-08 (×2): 3 [IU] via SUBCUTANEOUS
  Administered 2016-12-09 (×3): 2 [IU] via SUBCUTANEOUS
  Administered 2016-12-10 (×2): 3 [IU] via SUBCUTANEOUS
  Administered 2016-12-11: 7 [IU] via SUBCUTANEOUS
  Administered 2016-12-11 – 2016-12-12 (×2): 2 [IU] via SUBCUTANEOUS
  Administered 2016-12-12: 3 [IU] via SUBCUTANEOUS
  Administered 2016-12-12: 2 [IU] via SUBCUTANEOUS
  Administered 2016-12-13 (×2): 1 [IU] via SUBCUTANEOUS
  Administered 2016-12-13: 2 [IU] via SUBCUTANEOUS

## 2016-12-05 MED ORDER — LORAZEPAM 0.5 MG PO TABS: 0.5000 mg | ORAL_TABLET | Freq: Every evening | ORAL | Status: DC | PRN

## 2016-12-05 MED ORDER — AMLODIPINE BESYLATE 10 MG PO TABS
10.0000 mg | ORAL_TABLET | Freq: Every day | ORAL | Status: DC
  Administered 2016-12-05 – 2016-12-10 (×6): 10 mg via ORAL
  Filled 2016-12-05 (×6): qty 1

## 2016-12-05 MED ORDER — LEVOTHYROXINE SODIUM 25 MCG PO TABS
25.0000 ug | ORAL_TABLET | Freq: Every day | ORAL | Status: DC
  Administered 2016-12-05: 25 ug via ORAL
  Filled 2016-12-05 (×2): qty 1

## 2016-12-05 MED ORDER — NITROGLYCERIN 0.4 MG SL SUBL: 0.4000 mg | SUBLINGUAL_TABLET | SUBLINGUAL | Status: DC | PRN

## 2016-12-05 MED ORDER — SODIUM CHLORIDE 0.9 % IV SOLN
INTRAVENOUS | Status: AC
  Administered 2016-12-05: 14:00:00 via INTRAVENOUS

## 2016-12-05 MED ORDER — SODIUM CHLORIDE 1 G PO TABS
1.0000 g | ORAL_TABLET | Freq: Two times a day (BID) | ORAL | Status: DC
  Administered 2016-12-05 – 2016-12-06 (×3): 1 g via ORAL
  Filled 2016-12-05 (×5): qty 1

## 2016-12-05 MED ORDER — HALOPERIDOL LACTATE 5 MG/ML IJ SOLN: 2.0000 mg | Freq: Once | INTRAMUSCULAR | Status: DC

## 2016-12-05 MED ORDER — CLOPIDOGREL BISULFATE 75 MG PO TABS
75.0000 mg | ORAL_TABLET | Freq: Every evening | ORAL | Status: DC
  Administered 2016-12-05 – 2016-12-11 (×7): 75 mg via ORAL
  Filled 2016-12-05 (×9): qty 1

## 2016-12-05 MED ORDER — ATORVASTATIN CALCIUM 40 MG PO TABS
40.0000 mg | ORAL_TABLET | Freq: Every evening | ORAL | Status: DC
  Administered 2016-12-05: 40 mg via ORAL
  Filled 2016-12-05: qty 1

## 2016-12-05 MED ORDER — HYPROMELLOSE (GONIOSCOPIC) 2.5 % OP SOLN
1.0000 [drp] | Freq: Every day | OPHTHALMIC | Status: DC
  Administered 2016-12-05 – 2016-12-13 (×6): 1 [drp] via OPHTHALMIC
  Filled 2016-12-05: qty 15

## 2016-12-05 MED ORDER — FLUTICASONE PROPIONATE 50 MCG/ACT NA SUSP
1.0000 | Freq: Every day | NASAL | Status: DC | PRN
  Administered 2016-12-13: 1 via NASAL
  Filled 2016-12-05: qty 16

## 2016-12-05 MED ORDER — MOMETASONE FURO-FORMOTEROL FUM 200-5 MCG/ACT IN AERO
2.0000 | INHALATION_SPRAY | Freq: Two times a day (BID) | RESPIRATORY_TRACT | Status: DC
  Administered 2016-12-05 – 2016-12-13 (×15): 2 via RESPIRATORY_TRACT
  Filled 2016-12-05: qty 8.8

## 2016-12-05 MED ORDER — METOPROLOL SUCCINATE ER 50 MG PO TB24
50.0000 mg | ORAL_TABLET | Freq: Every day | ORAL | Status: DC
  Administered 2016-12-05 – 2016-12-10 (×6): 50 mg via ORAL
  Filled 2016-12-05 (×6): qty 1

## 2016-12-05 MED ORDER — ENOXAPARIN SODIUM 40 MG/0.4ML ~~LOC~~ SOLN
40.0000 mg | SUBCUTANEOUS | Status: DC
  Administered 2016-12-05 – 2016-12-09 (×3): 40 mg via SUBCUTANEOUS
  Filled 2016-12-05 (×3): qty 0.4

## 2016-12-05 MED ORDER — VITAMIN C 500 MG PO TABS
250.0000 mg | ORAL_TABLET | Freq: Every day | ORAL | Status: DC
  Administered 2016-12-05 – 2016-12-11 (×7): 250 mg via ORAL
  Filled 2016-12-05 (×10): qty 1

## 2016-12-05 MED ORDER — ENSURE ENLIVE PO LIQD
237.0000 mL | Freq: Two times a day (BID) | ORAL | Status: DC
  Administered 2016-12-05 – 2016-12-11 (×9): 237 mL via ORAL

## 2016-12-05 MED ORDER — ADULT MULTIVITAMIN W/MINERALS CH
1.0000 | ORAL_TABLET | Freq: Every day | ORAL | Status: DC
  Administered 2016-12-05 – 2016-12-11 (×7): 1 via ORAL
  Filled 2016-12-05 (×10): qty 1

## 2016-12-05 MED ORDER — IRBESARTAN 75 MG PO TABS
150.0000 mg | ORAL_TABLET | Freq: Every day | ORAL | Status: DC
  Administered 2016-12-05: 150 mg via ORAL
  Filled 2016-12-05: qty 2

## 2016-12-05 MED ORDER — LORATADINE 10 MG PO TABS
10.0000 mg | ORAL_TABLET | Freq: Every day | ORAL | Status: DC | PRN
  Filled 2016-12-05: qty 1

## 2016-12-05 MED ORDER — ACETAMINOPHEN 500 MG PO TABS: 1000.0000 mg | ORAL_TABLET | Freq: Four times a day (QID) | ORAL | Status: DC | PRN

## 2016-12-05 NOTE — Progress Notes (Signed)
Patient is requesting to take a shower. MD notified. Ordered to allow patient to take shower. Will continue to monitor.

## 2016-12-05 NOTE — Progress Notes (Signed)
New Admission Note:  Arrival Method: Stretcher Mental Orientation: Alert and oriented x 3-4 Telemetry: Box 21 NSR Assessment: Completed Skin: Warm and dry  IV: NSL Pain: Denies Tubes: N/A Safety Measures: Safety Fall Prevention Plan was given, discussed and signed. Admission: Completed [address] Orientation: Patient has been orientated to the room, unit and the staff. Family: [place] daughter   Orders have been reviewed and implemented. Will continue to monitor the patient. Call light has been placed within reach and bed alarm has been activated.   Sima Matas BSN, RN  Phone Number: [phone]

## 2016-12-05 NOTE — Care Management Obs Status (Signed)
[facility] NOTIFICATION   Patient Details  Name: Christine [name] MRN: [id number] Date of Birth: 01/24/1934   Medicare Observation Status Notification Given:  Yes    Juliani Laduke, Rory Percy, RN 12/05/2016, 11:25 AM

## 2016-12-05 NOTE — H&P (Signed)
Date: 12/05/2016               Patient Name:  Christine [name] MRN: [id number]  DOB: 01/24/1934 Age / Sex: 83 y.o., female   PCP: [name], MD         Medical Service: Internal Medicine Teaching Service         Attending Physician: Dr. [name], MD    First Contact: Dr. Wynetta Emery Pager: [phone]  Second Contact: Dr. Tiburcio Pea Pager: [phone]       After Hours (After 5p/  First Contact Pager: [phone]  weekends / holidays): Second Contact Pager: [phone]   Chief Complaint: nausea, diarrhea  History of Present Illness:  Christine [name] is an 83yo female with PMH of HTN, HLD, hypothyroidism, CAD, T2DM, COPD, CKD presenting to [facility] for nausea, vomiting, diarrhea and agitated behavior. Patient was recently admitted for confusion & hyponatremia and second admission for dysarthria. It was concluded her hyponatremia was due to "tea and toast" diet - she was placed on free water restricted diet, supplements with sodium tabs, and discontinued from imipramine. Her dysarthria was worked up and not found to be due to CVA per CT head and MRI brain; she was also going through a lot of guilt and acute grief at the loss of her sister recently that was thought to be contributing to her change in behavior. No evidence of infection was found at the time.   Patient sleeping but was alert when woken up. She initially verbalized upon waking and was able to state name, date, location, then would only shake her head yes/no to certain questions. She would not respond as to why she was in the hospital. She indicated that she was not in pain, was not having headache, abdominal pain, nausea, chest pain, shortness of breath.  Niece states that patient has had on/off agitation and giddiness (that initially began while hospitalized) since being discharged. Patient had an ED visit on 12/03/2016 for her aggressive behavior that was observed shortly after administering Attarax. BH evaluated patient, discontinued the Atarax  and recommended outpatient psych f/u with Dr. Casimiro Needle who works with geriatric patients. Since then patient had had a pleasant morning and afternoon per niece until her PM meds of atorvastatin and Plavix were administered which were promptly followed by multiple episodes of diarrhea, nausea and vomiting at which time patient was brought to the ED. Per niece, while in the ED, patient had become very agitated again to the point of requiring Haldol by ED physician. Since then, patient has been calm and sleeping.   We spoke to daughter Christine [name] by phone per request. She is concerned that patients symptoms of agitation, nausea, vomiting, and diarrhea are due to imipramine withdrawal and would like the medication added back.   Meds:  Tylenol 500mg  q6hr PRN  Advair diskus BID Amlodipine 10mg  daily Ascorbic acid 250mg  daily Atorvastatin 40mg  daily Clopidogrel 75mg  daily Esomeprazole 40mg  daily Ensure Enlive BID between meals Fluticasone BID Glimepiride 1mg  daily Guaifenesin-dextromethorphan q4hrs PRN Hydroxypropyl methylcellulose 2.5% ophth sol Isosorbide mononitrate 30mg  daily Levothyroxine 8mcg daily Loratadine 10mg  PRN daily Lorazepam 0.5mg  PRN qhs Metoprolol succinate 50mg  daily Multivitamin daily Nitrostat 0.4mg  SL PRN Odansetron 4mg  q6hrs PRN Polycarbophil 625mg  daily Polyethylene glycol 17mg  daily PRN Senna 8.6mg  daily PRN Sodium Chloride 1mg  BID Valsartan 320mg  daily Venlafaxine xr 75mg  daily  Allergies: Allergies as of 12/04/2016 - Review Complete 12/04/2016  Allergen Reaction Noted  . Aspirin Swelling and Other (See Comments)   .  Nsaids Other (See Comments) 11/25/2016  . Codeine Nausea And Vomiting and Other (See Comments)   . Other Other (See Comments) 05/08/2016  . Penicillins Diarrhea 12/29/2011   Past Medical History:  Diagnosis Date  . Anemia   . Anginal pain ([place])   . Arthritis    "back, arms, hips; hands" (11/30/2015)  . Benign hypertensive heart and kidney  disease with diastolic CHF, NYHA class II and CKD stage III ([place])   . Blood transfusion 03-09-1971   "after daughter born, attempted to give me blood; couldn't give it cause my blood was cold" (09/23/2013)  . CAD (coronary artery disease)    a. LHC 08/23/11: dLM 40-50%, oRI 40%, oCFX 40%, oD1 70% (small and not amenable to PCI).  LM lesion did not appear to be flow limiting.  Medical rx was recommended;  b. Echo 08/23/11: mild LVH, EF 60-65%, grade 1 diast dysfxn, mild BAE, PASP 24;  c. 02/2012  Cath: LM 50d, LAD 50p, D1 70ost, RI 70p, RCA ok->Med Rx;  d. 03/08/2013 Cardiolite: EF 88, no ischemia/infarct.  . Carotid stenosis    a. dopplers 10/12:  0-39% bilat ICA;  b. 10/2012 U/S: 0-39% bilat, f/u 1 yr (10/2013).  . Chronic bronchitis   . Depression   . Diverticulosis of colon (without mention of hemorrhage)   . GERD (gastroesophageal reflux disease)   . Heart murmur, systolic    2-D echo in December 2011 showed a normal EF with grade 1 diastolic dysfunction, trivial pulmonary regurgitation and mildly elevated PA pressure at 37 mmHg probably secondary to her COPD.dynamic obstruction-mid cavity obliteration;  b. 02/2012 Echo: EF 60-65%, mild LVH, PASP 7mmHg.  Marland Kitchen History of stomach ulcers 1970's  . Hyperlipidemia   . Hypertension   . Hypothyroidism   . Migraines    a. Next atypical symptoms in the past. Patient was started on Neurontin for possible neuropathic origin of her pain.  . Polymyalgia rheumatica ([place])   . Shortness of breath on exertion    "just related to angina >1 yr ago" (09/23/2013)  . Sinus arrhythmia   . Type II diabetes mellitus ([place])    a. On oral hypoglycemic agents.    Family History: Father with COPD, Mother died in 03-09-23 of unknown causes  Social History: Denies tobacco, alcohol or drug use. Lives with daughter  Review of Systems: A complete ROS was negative except as per HPI.   Physical Exam: Blood pressure (!) 117/44, pulse 68, temperature 98.3 F (36.8 C), temperature source  Oral, resp. rate 19, height 5\' 4"  (1.626 m), weight 115 lb 11.9 oz (52.5 kg), last menstrual period 02/11/1975, SpO2 97 %. General: asleep but arousable, well-developed, and cooperative to examination.  Head: normocephalic and atraumatic.  Eyes: vision grossly intact, pupils equal, pupils round, pupils reactive to light, no injection and anicteric.  Mouth: pharynx pink and moist.  Neck: supple, full ROM, no thyromegaly, no JVD.  Lungs: normal respiratory effort, no accessory muscle use, normal breath sounds, no crackles, and no wheezes. Heart: normal rate, regular rhythm, 3/6 systolic ejection murmur, no gallop, and no rub.  Abdomen: soft, non-tender, normal bowel sounds, no distention, no guarding, no rebound tenderness, no hepatomegaly.  Msk: no joint swelling, no joint warmth, and no redness over joints.  Pulses: 2+ DP/PT pulses bilaterally Extremities: No cyanosis, clubbing, edema Neurologic: alert & oriented X3, cranial nerves II-XII grossly intact, strength normal in all extremities. Able to move all extremities. Follows commands.  Skin: turgor normal and no rashes.  Psych: Oriented X3, will not respond as normally interactive, good eye contact, not anxious appearing  Labs: Na 132, K 5.0, Cl 93, CO2 24, BUN 17, Cl 0.74, Glu 178 WBC 10.2, Hgb 12.2, Hct 36.4, Plts 356 C diff Ag and tox negative UA unremarkable  EKG: Personally reviewed - SR, no acute ST or T wave changes   CT head: No acute changes; small vessel changes noted  Assessment & Plan by Problem: Active Problems:   Altered mental status   Agitation  Agitation, Depression: Patient with agitation in setting of medication change, multiple hospitalizations and acute grief. Patient was recently taken off of imipramine in setting of hyponatremia and started on venlafaxine and atarax for her depression and anxiety. Atarax was recently discontinued by [facility] in ED day prior to admission. --hold venlafaxine --ativan PRN for  agitation if consoling is not effective --consider psychiatry re-consult in AM for assistance in anti-depressant management  Nausea, vomiting, diarrhea: Patient with acute onset of abdominal symptoms hours before presentation to ED. C diff screen was negative. Patient afebrile, hemodynamically stable, electrolytes stable, and without a white count. Differential includes impramine withdrawal vs more likely gastroenteritis.  --Supportive care with zofran PRN, gentle IVF for now --no indication for antibiotics at this time  Hyponatremia: Patient with hyponatremia 2/2 "tea and toast" diet. Sodium on admission is 132 - stable and slowly improving. Per niece, patient has been compliant with free water restriction and salt tablets.  --NaCL 1g tabs BID --Ensure supplements BID between meals --receiving gentle NS hydration  --f/u AM BMP  COPD: Patient with no increased work of breathing, shortness of breath or wheezing on exam.  --continue home advair, claritin and flonase  T2DM: Patient on oral management. CBG on admission was 178.  --SSI-S WC  Hypothyroidism: Patient with last TSH 2.136 on 11/26/2016. --continue home levothyroxine 44mcg daily  CAD: Patient with [place] on 11/16 showing stable coronary disease with 40-50% L main stenosis and 70-70% D1 stenosis. No active chest pain, shortness of breath different from baseline, EKG with possible PAC's without acute ischemic changes. --continue Plavix 75mg  daily as pt allergic to asa; atorvastatin 40mg  daily  Chronic diastolic CHF: Patient with Echo 09/2015 showing LVEF [id number] and no diastolic dysfunction at the time. On exam she appears euvolemic. --continue to monitor fluid status  HLD: Patient continued on atorvastatin 40mg  daily.  GERD: Patient continued on home regimen of protonix 40mg  daily  Diet: CM, free water restriction to 1288ml/d IVF: 70ml/hr x 10hrs DVT: lovenox Code: FULL  Dispo: Admit patient to Observation with expected  length of stay less than 2 midnights.  Signed: Alphonzo Grieve, MD 12/05/2016, 3:31 AM  Pager [phone]

## 2016-12-05 NOTE — ED Notes (Signed)
Patient transported to CT 

## 2016-12-05 NOTE — Progress Notes (Addendum)
Subjective: Admitted overnight and had episodes of agitation requiring Haldol. Calm and quiet this morning, but answering questions appropriately about her symptoms and home medications. Family at bedside participating in discussion about her behavior at home and the patterns they have noticed.   Objective: Vital signs in last 24 hours: Vitals:   12/04/16 2345 12/05/16 0030 12/05/16 0100 12/05/16 0220  BP: (!) 117/50 164/55 158/67 (!) 117/44  Pulse: 68 72 72 68  Resp: 17 15 15 19   Temp:    98.3 F (36.8 C)  TempSrc:    Oral  SpO2: 100% 100% 100% 97%  Weight:    115 lb 11.9 oz (52.5 kg)  Height:    5\' 4"  (1.626 m)    Intake/Output Summary (Last 24 hours) at 12/05/16 0948 Last data filed at 12/05/16 0700  Gross per 24 hour  Intake           268.75 ml  Output              155 ml  Net           113.75 ml    Physical Exam General: Awake but sluggish, well-developed, cooperative Head: normocephalic and atraumatic, moist mucous membranes  Lungs: Clear to auscultation bilaterally, normal work of breathing Heart: RRR, systolic ejection murmur, no gallops or rubs.  Abdomen: soft, non-tender, normal bowel sounds, no distention, no guarding Extremities: Thin bulk and normal ROM, no cyanosis, clubbing, edema Neurologic: alert & oriented X3, moving all extremities spontaneously Skin: turgor normal, warm, dry, and intact  Psych: Blunted affect  Labs / Imaging / Procedures: CBC Latest Ref Rng & Units 12/04/2016 12/03/2016 11/30/2016  WBC 4.0 - 10.5 K/uL 10.2 6.2 7.2  Hemoglobin 12.0 - 15.0 g/dL 12.2 11.9(L) 13.5  Hematocrit 36.0 - 46.0 % 36.4 34.9(L) 39.8  Platelets 150 - 400 K/uL 356 288 331   BMP Latest Ref Rng & Units 12/05/2016 12/04/2016 12/03/2016  Glucose 65 - 99 mg/dL 161(H) 178(H) 146(H)  BUN 6 - 20 mg/dL 12 17 21(H)  Creatinine 0.44 - 1.00 mg/dL 0.73 0.74 0.92  BUN/Creat Ratio 12 - 28 - - -  Sodium 135 - 145 mmol/L 132(L) 132(L) 130(L)  Potassium 3.5 - 5.1 mmol/L 3.9 5.0 4.1   Chloride 101 - 111 mmol/L 99(L) 93(L) 94(L)  CO2 22 - 32 mmol/L 24 24 24   Calcium 8.9 - 10.3 mg/dL 9.6 10.3 9.6   Ct Head Wo Contrast  Result Date: 12/05/2016 CLINICAL DATA:  Less talkative ; not acting like herself EXAM: CT HEAD WITHOUT CONTRAST TECHNIQUE: Contiguous axial images were obtained from the base of the skull through the vertex without intravenous contrast. COMPARISON:  11/30/2016 FINDINGS: Brain: No acute territorial infarction, intracranial hemorrhage or focal mass lesion is visualized. Mild to moderate periventricular white matter hypodensity consistent with small vessel change. Ventricles are nonenlarged. No focal mass. Vascular: No hyperdense vessels.  No unexpected calcifications. Skull: Mastoid air cells clear.  No skull fracture. Sinuses/Orbits: Paranasal sinuses show mild mucosal thickening in the ethmoid sinuses. Small mucous retention cyst or sinus lobulated thickening right maxillary sinus. No acute orbital abnormality. Other: None IMPRESSION: No CT evidence for acute intracranial abnormality Electronically Signed   By: Donavan Foil M.D.   On: 12/05/2016 01:22   Assessment/Plan:  Agitation and altered mental status Patient with agitation in setting of medication change, multiple hospitalizations and acute grief. Patient was recently taken off of imipramine in setting of hyponatremia and started on venlafaxine and atarax for her depression  and anxiety. Atarax was recently discontinued by [facility] in ED day prior to admission. Continues to have difficulties sleeping at night.  - Checking TSH, HIV, RPR - all nml - Restart Imipramine 25 mg BID - Start Mirtazapine 15 mg QHS to help with sleep and appetite - Dc Effexor and Atarax - Avoid BEERS criteria medications, avoid BZDs  Nausea, abdominal pain, diarrhea, patient with acute onset of abdominal symptoms hours before presentation to ED. C diff negative. Patient afebrile, hemodynamically stable, electrolytes stable, and without  leukocytosis. Asymptomatic this AM. Recent accelerated bowel regimen during hospital stay due to severe constipation, may have overcorrected. -- Supportive care with zofran PRN, gentle IVF for now -- Holding laxative medications  Hypertension, labile with SBP 200+ intermittently, may be the cause of psychiatric instability (hypertensive encephalopathy).  -- Irbesartan 150 mg, increase to 300 mg tomorrow and add 150 mg catch up dose now -- Amlodipine 10 mg  -- Metoprolol 50 mg  -- Imdur 30 mg   Hyponatremia, patient with hyponatremia 2/2 "tea and toast" diet. Sodium stable at 132.  --NaCL 1g tabs BID --Ensure supplements BID between meals --receiving gentle NS hydration  --Trend BMP  COPD, no symptoms, continue home Advair, Claritin, Flonase  T2DM, on minimal oral medications.  --Hold home Amaryl --SSI-S WC  Hypothyroidism, TSH 2.136 on 11/26/2016 - levothyroxine 34mcg daily  CAD, patient with LHC on 11/16 showing stable coronary disease with 40-50% L main stenosis and 70-70% D1 stenosis. No active chest pain, shortness of breath different from baseline, EKG with possible PAC's without acute ischemic changes. -- Plavix 75mg  daily -- atorvastatin 40mg  daily  HLD -- atorvastatin 40mg  daily.  GERD -- protonix 40mg  daily  Dispo: Anticipated discharge in approximately 2-3 day(s).   LOS: 0 days   Asencion Partridge, MD 12/05/2016, 9:48 AM Pager: [phone]

## 2016-12-06 DIAGNOSIS — T434X5A Adverse effect of butyrophenone and thiothixene neuroleptics, initial encounter: Secondary | ICD-10-CM

## 2016-12-06 DIAGNOSIS — G2111 Neuroleptic induced parkinsonism: Secondary | ICD-10-CM

## 2016-12-06 DIAGNOSIS — E119 Type 2 diabetes mellitus without complications: Secondary | ICD-10-CM

## 2016-12-06 DIAGNOSIS — I10 Essential (primary) hypertension: Secondary | ICD-10-CM

## 2016-12-06 DIAGNOSIS — J449 Chronic obstructive pulmonary disease, unspecified: Secondary | ICD-10-CM | POA: Diagnosis not present

## 2016-12-06 DIAGNOSIS — K219 Gastro-esophageal reflux disease without esophagitis: Secondary | ICD-10-CM | POA: Diagnosis not present

## 2016-12-06 DIAGNOSIS — R410 Disorientation, unspecified: Secondary | ICD-10-CM | POA: Diagnosis not present

## 2016-12-06 DIAGNOSIS — E039 Hypothyroidism, unspecified: Secondary | ICD-10-CM | POA: Diagnosis not present

## 2016-12-06 DIAGNOSIS — E785 Hyperlipidemia, unspecified: Secondary | ICD-10-CM | POA: Diagnosis not present

## 2016-12-06 DIAGNOSIS — E871 Hypo-osmolality and hyponatremia: Secondary | ICD-10-CM | POA: Diagnosis not present

## 2016-12-06 DIAGNOSIS — R451 Restlessness and agitation: Secondary | ICD-10-CM | POA: Diagnosis not present

## 2016-12-06 DIAGNOSIS — I2510 Atherosclerotic heart disease of native coronary artery without angina pectoris: Secondary | ICD-10-CM | POA: Diagnosis not present

## 2016-12-06 LAB — BASIC METABOLIC PANEL
ANION GAP: 9 (ref 5–15)
BUN: 11 mg/dL (ref 6–20)
CALCIUM: 9.4 mg/dL (ref 8.9–10.3)
CO2: 23 mmol/L (ref 22–32)
Chloride: 104 mmol/L (ref 101–111)
Creatinine, Ser: 0.84 mg/dL (ref 0.44–1.00)
GFR calc Af Amer: >60 mL/min (ref >60)
GLUCOSE: 124 mg/dL — AB (ref 65–99)
Potassium: 3.9 mmol/L (ref 3.5–5.1)
Sodium: 136 mmol/L (ref 135–145)

## 2016-12-06 LAB — GLUCOSE, CAPILLARY
GLUCOSE-CAPILLARY: 176 mg/dL — AB (ref 65–99)
GLUCOSE-CAPILLARY: 103 mg/dL — AB (ref 65–99)
Glucose-Capillary: 122 mg/dL — ABNORMAL HIGH (ref 65–99)
Glucose-Capillary: 138 mg/dL — ABNORMAL HIGH (ref 65–99)

## 2016-12-06 MED ORDER — HALOPERIDOL LACTATE 5 MG/ML IJ SOLN
1.0000 mg | Freq: Once | INTRAMUSCULAR | Status: AC
  Administered 2016-12-06: 1 mg via INTRAVENOUS
  Filled 2016-12-06: qty 1

## 2016-12-06 MED ORDER — HALOPERIDOL 1 MG PO TABS
1.0000 mg | ORAL_TABLET | Freq: Once | ORAL | Status: AC
  Filled 2016-12-06: qty 1

## 2016-12-06 MED ORDER — ATORVASTATIN CALCIUM 20 MG PO TABS
20.0000 mg | ORAL_TABLET | Freq: Every evening | ORAL | Status: DC
  Administered 2016-12-06 – 2016-12-11 (×5): 20 mg via ORAL
  Filled 2016-12-06 (×8): qty 1

## 2016-12-06 MED ORDER — BENZTROPINE MESYLATE 1 MG/ML IJ SOLN
1.0000 mg | Freq: Once | INTRAMUSCULAR | Status: DC
  Filled 2016-12-06: qty 1

## 2016-12-06 NOTE — Progress Notes (Signed)
  Date: 12/06/2016  Patient name: Christine [name]  Medical record number: [id number]  Date of birth: 01/24/1934   I have seen and evaluated this patient and I have discussed the plan of care with the house staff. Please see Dr. Durenda Age note for complete details. I concur with his findings with the following additions/corrections:   Patient's family refused Cogentin to counter-act the effects of haldol related parkinsonism.  I think we need to allow wash out of this medication (20 hour half life) before starting any new pharmacotherapy.  Given TSH, would recommend stopping levothyroxine.  Encouraged continued use of BP medications.  BP better this afternoon.  [name], MD 12/06/2016, 4:05 PM

## 2016-12-06 NOTE — Progress Notes (Signed)
Subjective: Restarted Imipramine yesterday. Family reports she did well and was at her baseline yesterday. Mirtazapine was refused in the evening. Overnight again had episode of "agitation" that consisted of her using the restroom and then returning to bed and verbalizing unhappy thoughts in a circular pattern. Family at bedside talked with her and tried to play soothing music but Christine [name] could not be redirected. She never became combative or upset. She still received Haldol 1mg  IV at 3AM and this morning is awake but rigid, mute, and twitching rhythmically in bed. Concerning for EPS from Haldol. Shakes head/nods to simple questions.   Objective: Vital signs in last 24 hours: Vitals:   12/05/16 2115 12/05/16 2119 12/06/16 0206 12/06/16 0610  BP:  (!) 149/53 (!) 195/83 135/65  Pulse:  76  78  Resp:  19  20  Temp:  98.7 F (37.1 C)  98.3 F (36.8 C)  TempSrc:  Oral  Oral  SpO2: 98% 100%  100%  Weight:  115 lb 8.3 oz (52.4 kg)    Height:        Intake/Output Summary (Last 24 hours) at 12/06/16 [id number] Last data filed at 12/06/16 [id number]  Gross per 24 hour  Intake              360 ml  Output                0 ml  Net              360 ml    Physical Exam General: Awake, sitting up in bed propped on one shaking arm, does not speak but nods/shakes to simple questions Head: normocephalic and atraumatic Lungs: Clear to auscultation bilaterally, normal work of breathing Heart: RRR, systolic ejection murmur, no gallops or rubs.  Abdomen: Soft, non-tender, no distention, no guarding Extremities: Thin bulk and normal ROM, no cyanosis, clubbing, edema Neurologic: Awake but nonverbal, will not follow commands, rigid with masked face, and spontaneous rhythmic contraction of extremity muscles, increased tone throughout Skin: turgor normal, warm, dry, and intact  Psych: Blunted affect  Labs / Imaging / Procedures: CBC Latest Ref Rng & Units 12/04/2016 12/03/2016 11/30/2016  WBC 4.0 - 10.5 K/uL 10.2  6.2 7.2  Hemoglobin 12.0 - 15.0 g/dL 12.2 11.9(L) 13.5  Hematocrit 36.0 - 46.0 % 36.4 34.9(L) 39.8  Platelets 150 - 400 K/uL 356 288 331   BMP Latest Ref Rng & Units 12/05/2016 12/04/2016 12/03/2016  Glucose 65 - 99 mg/dL 161(H) 178(H) 146(H)  BUN 6 - 20 mg/dL 12 17 21(H)  Creatinine 0.44 - 1.00 mg/dL 0.73 0.74 0.92  BUN/Creat Ratio 12 - 28 - - -  Sodium 135 - 145 mmol/L 132(L) 132(L) 130(L)  Potassium 3.5 - 5.1 mmol/L 3.9 5.0 4.1  Chloride 101 - 111 mmol/L 99(L) 93(L) 94(L)  CO2 22 - 32 mmol/L 24 24 24   Calcium 8.9 - 10.3 mg/dL 9.6 10.3 9.6   No results found. Assessment/Plan:  Agitation and altered mental status Patient with new episodes of agitation in setting of medication change, multiple hospitalizations and acute grief. Patient was recently taken off of imipramine in setting of hyponatremia and started on venlafaxine and atarax for her depression and anxiety. Atarax was recently discontinued by [facility] in ED day prior to admission. Continues to have difficulties sleeping at night. Checked TSH, HIV, RPR - all nml. Daytime psych status seems improved on Imipramine. - Imipramine 25 mg BID - Mirtazapine 15 mg QHS to help with sleep and appetite,  discuss - Avoid BEERS criteria medications, avoid BZDs  Dystonic extrapyramidal reaction, acute rigidity, rhythmic contractions in all extremities with increased muscle tone, likely Haldol-induced.  -- Benztropine 1 mg IV x1 this AM -- Added Haldol intolerance to allergy list  Nausea, abdominal pain, diarrhea, resolved, patient had acute onset of abdominal symptoms hours before presentation to ED. C diff negative. Recent accelerated bowel regimen during hospital stay due to severe constipation, may have overcorrected. -- Supportive care with zofran PRN -- Holding laxative medications  Hypertension, labile with SBP 200+ intermittently, may be the cause of psychiatric instability (hypertensive encephalopathy).  -- Irbesartan 300 mg daily --  Amlodipine 10 mg  -- Metoprolol 50 mg  -- Imdur 30 mg   Hyponatremia, patient with hyponatremia 2/2 "tea and toast" diet. Sodium normalized.  --NaCL 1g tabs BID --Ensure supplements BID between meals --Trend BMP  COPD, no symptoms, continue home Advair, Claritin, Flonase  T2DM, on minimal oral medications.  --Hold home Amaryl --SSI-S WC  Hypothyroidism, TSH 2.136 on 11/26/2016 - levothyroxine 33mcg daily  CAD, patient with LHC on 11/16 showing stable coronary disease with 40-50% L main stenosis and 70-70% D1 stenosis. No active chest pain, shortness of breath different from baseline, EKG with possible PAC's without acute ischemic changes. -- Plavix 75mg  daily -- atorvastatin 40mg  daily  HLD -- atorvastatin 40mg  daily.  GERD -- protonix 40mg  daily  Dispo: Anticipated discharge in approximately 1-2 day(s).   LOS: 1 day   Asencion Partridge, MD 12/06/2016, 7:21 AM Pager: [phone]

## 2016-12-07 ENCOUNTER — Inpatient Hospital Stay (HOSPITAL_COMMUNITY): Payer: Medicare Other

## 2016-12-07 DIAGNOSIS — R0989 Other specified symptoms and signs involving the circulatory and respiratory systems: Secondary | ICD-10-CM

## 2016-12-07 DIAGNOSIS — J449 Chronic obstructive pulmonary disease, unspecified: Secondary | ICD-10-CM | POA: Diagnosis not present

## 2016-12-07 DIAGNOSIS — F4320 Adjustment disorder, unspecified: Secondary | ICD-10-CM | POA: Diagnosis not present

## 2016-12-07 DIAGNOSIS — E871 Hypo-osmolality and hyponatremia: Secondary | ICD-10-CM | POA: Diagnosis not present

## 2016-12-07 DIAGNOSIS — K219 Gastro-esophageal reflux disease without esophagitis: Secondary | ICD-10-CM | POA: Diagnosis not present

## 2016-12-07 DIAGNOSIS — I10 Essential (primary) hypertension: Secondary | ICD-10-CM | POA: Diagnosis not present

## 2016-12-07 DIAGNOSIS — E785 Hyperlipidemia, unspecified: Secondary | ICD-10-CM | POA: Diagnosis not present

## 2016-12-07 DIAGNOSIS — R451 Restlessness and agitation: Secondary | ICD-10-CM | POA: Diagnosis not present

## 2016-12-07 DIAGNOSIS — E119 Type 2 diabetes mellitus without complications: Secondary | ICD-10-CM | POA: Diagnosis not present

## 2016-12-07 DIAGNOSIS — E039 Hypothyroidism, unspecified: Secondary | ICD-10-CM | POA: Diagnosis not present

## 2016-12-07 DIAGNOSIS — Z794 Long term (current) use of insulin: Secondary | ICD-10-CM | POA: Diagnosis not present

## 2016-12-07 DIAGNOSIS — R410 Disorientation, unspecified: Secondary | ICD-10-CM | POA: Diagnosis not present

## 2016-12-07 LAB — BASIC METABOLIC PANEL
ANION GAP: 11 (ref 5–15)
BUN: 12 mg/dL (ref 6–20)
CALCIUM: 9.7 mg/dL (ref 8.9–10.3)
CO2: 20 mmol/L — AB (ref 22–32)
Chloride: 102 mmol/L (ref 101–111)
Creatinine, Ser: 0.79 mg/dL (ref 0.44–1.00)
GFR calc non Af Amer: >60 mL/min (ref >60)
Glucose, Bld: 157 mg/dL — ABNORMAL HIGH (ref 65–99)
Potassium: 3.7 mmol/L (ref 3.5–5.1)
Sodium: 133 mmol/L — ABNORMAL LOW (ref 135–145)

## 2016-12-07 LAB — GLUCOSE, CAPILLARY
GLUCOSE-CAPILLARY: 172 mg/dL — AB (ref 65–99)
GLUCOSE-CAPILLARY: 237 mg/dL — AB (ref 65–99)
Glucose-Capillary: 174 mg/dL — ABNORMAL HIGH (ref 65–99)
Glucose-Capillary: 144 mg/dL — ABNORMAL HIGH (ref 65–99)

## 2016-12-07 MED ORDER — HYDRALAZINE HCL 20 MG/ML IJ SOLN
10.0000 mg | Freq: Once | INTRAMUSCULAR | Status: DC
  Filled 2016-12-07: qty 1

## 2016-12-07 MED ORDER — CLONAZEPAM 0.5 MG PO TABS: 0.5000 mg | ORAL_TABLET | Freq: Every evening | ORAL | Status: DC | PRN

## 2016-12-07 MED ORDER — ISOSORBIDE MONONITRATE ER 30 MG PO TB24
30.0000 mg | ORAL_TABLET | Freq: Every day | ORAL | Status: DC
  Administered 2016-12-07 – 2016-12-12 (×4): 30 mg via ORAL
  Filled 2016-12-07 (×7): qty 1

## 2016-12-07 MED ORDER — CALCIUM POLYCARBOPHIL 625 MG PO TABS
625.0000 mg | ORAL_TABLET | Freq: Every day | ORAL | Status: DC
  Administered 2016-12-07 – 2016-12-13 (×6): 625 mg via ORAL
  Filled 2016-12-07 (×7): qty 1

## 2016-12-07 MED ORDER — SODIUM CHLORIDE 1 G PO TABS
1.0000 g | ORAL_TABLET | Freq: Every day | ORAL | Status: DC
  Administered 2016-12-07 – 2016-12-13 (×6): 1 g via ORAL
  Filled 2016-12-07 (×7): qty 1

## 2016-12-07 NOTE — Progress Notes (Signed)
IV removed due to infiltration. MD notified. Verbal order received to leave out Iv.

## 2016-12-07 NOTE — Progress Notes (Addendum)
Subjective: Slept from 11pm until only 2-3 AM. Woke with recurrent cyclical dysphoric thoughts around 4am minimal improvement with redirection with family and RN, but no overt aggression. Dystonia/EPS resolved from yesterday. Sitting up and eating minimal breakfast with constant assistance and cueing per family. They also noticed her spitting up scant yellow/red sputum this AM, no overt cough, has changed from yellow yesterday.  Objective: Vital signs in last 24 hours: Vitals:   12/06/16 2032 12/06/16 2125 12/06/16 2148 12/07/16 0451  BP:  (!) 176/77 (!) 168/72 (!) 174/85  Pulse:  86 80 85  Resp:  17  16  Temp:  98 F (36.7 C)  98.2 F (36.8 C)  TempSrc:  Oral  Oral  SpO2: 100% 100%    Weight:  113 lb 1.6 oz (51.3 kg)    Height:  5\' 4"  (1.626 m)      Intake/Output Summary (Last 24 hours) at 12/07/16 0851 Last data filed at 12/06/16 1300  Gross per 24 hour  Intake              240 ml  Output                0 ml  Net              240 ml    Physical Exam General: Awake, sitting up eating breakfast, soft spoken with blunt affect Head: Normocephalic and atraumatic, moist MM Lungs: Clear to auscultation bilaterally, normal work of breathing Heart: RRR, systolic ejection murmur, no gallops or rubs.  Abdomen: Soft, non-tender, no distention, no guarding Extremities: Thin bulk and normal ROM, no cyanosis, clubbing, edema Neurologic: Alert and oriented, normal muscle tone, action tremor in hands bilaterally Skin: turgor normal, warm, dry, and intact  Psych: Blunted affect  Labs / Imaging / Procedures: CBC Latest Ref Rng & Units 12/04/2016 12/03/2016 11/30/2016  WBC 4.0 - 10.5 K/uL 10.2 6.2 7.2  Hemoglobin 12.0 - 15.0 g/dL 12.2 11.9(L) 13.5  Hematocrit 36.0 - 46.0 % 36.4 34.9(L) 39.8  Platelets 150 - 400 K/uL 356 288 331   BMP Latest Ref Rng & Units 12/07/2016 12/06/2016 12/05/2016  Glucose 65 - 99 mg/dL 157(H) 124(H) 161(H)  BUN 6 - 20 mg/dL 12 11 12   Creatinine 0.44 - 1.00 mg/dL  0.79 0.84 0.73  BUN/Creat Ratio 12 - 28 - - -  Sodium 135 - 145 mmol/L 133(L) 136 132(L)  Potassium 3.5 - 5.1 mmol/L 3.7 3.9 3.9  Chloride 101 - 111 mmol/L 102 104 99(L)  CO2 22 - 32 mmol/L 20(L) 23 24  Calcium 8.9 - 10.3 mg/dL 9.7 9.4 9.6   No results found. Assessment/Plan:  Agitation and altered mental status Patient with new episodes of agitation in setting of medication change, multiple hospitalizations and acute grief. Patient was recently taken off of imipramine in setting of hyponatremia and started on venlafaxine and atarax for her depression and anxiety. Atarax was recently discontinued by [facility] in ED day prior to admission. Continues to have difficulties sleeping at night. Checked TSH, HIV, RPR - all nml. Daytime psych status seems improved on Imipramine. Continues to have poor sleep and wakens with dysphoric thoughts at night. Avoid 1-st gen antipsychotics in future, prone to EPS. - Minimize psychoactive medications - Imipramine 25 mg BID - Klonopin QHS PRN for insomnia  Sputum production, no overt cough or fevers, has history of COPD - CXR today  Nausea, abdominal pain, diarrhea, resolved, patient had acute onset of abdominal symptoms hours before presentation to  ED. C diff negative. Recent accelerated bowel regimen during hospital stay due to severe constipation, may have overcorrected. -- Supportive care with zofran PRN -- Holding laxative medications -- Restarting home Fibercon  Hypertension, labile with SBP 200+ intermittently, seem to follow episodes of agitate rather than preceding them -- Irbesartan 300 mg daily -- Amlodipine 10 mg  -- Metoprolol 50 mg  -- Imdur 30 mg   Hyponatremia, patient with hyponatremia 2/2 "tea and toast" diet. Sodium normalized.  --NaCL 1g tabs QD --Ensure supplements BID between meals --Trend BMP  COPD, no symptoms, continue home Advair, Claritin, Flonase  T2DM, on minimal oral medications.  --Hold home Amaryl --SSI-S  WC  Hypothyroidism, TSH 2.136 on 11/26/2016 - holding home levothyroxine 9mcg daily  CAD, patient with LHC on 11/16 showing stable coronary disease with 40-50% L main stenosis and 70-70% D1 stenosis. No active chest pain, shortness of breath different from baseline, EKG with possible PAC's without acute ischemic changes. -- Plavix 75mg  daily -- atorvastatin reduced dose to 20mg  daily  HLD -- atorvastatin 20mg  daily.  GERD -- protonix 40mg  daily  Dispo: Anticipated discharge in approximately 1-2 day(s).   LOS: 2 days   Asencion Partridge, MD 12/07/2016, 8:51 AM Pager: [phone]

## 2016-12-07 NOTE — Progress Notes (Signed)
Pt's blood pressure=174/85 pulse=86. Per protocol I notified on call MD Lynch of hypertension and she ordered a one time dose of iv hydralazine see MAR. When I went to bedside to give it, pt and her family refused administration because they said Dr. Daryll Drown told them yesterday to not start any new medications. I attempted to educate that pt is already taking scheduled BP meds and this is just a prn one but they did not feel comfortable taking it. Will continue to monitor.

## 2016-12-07 NOTE — Progress Notes (Signed)
Pt has been mildly agitated and very confused for the past two hours. She's having cycling thoughts and repeating over and over "please let me go" and attempting to get up but has not been combative or verbally aggressive so far. Redirection is ineffective. The pt's daughter and I have stood her up, sat on the side of the bed, played soothing music, and attempted to calm her. Currently sitting on the side of the bed holding daughters hand. Will continue to monitor.

## 2016-12-07 NOTE — Progress Notes (Signed)
  Date: 12/07/2016  Patient name: Christine [name]  Medical record number: [id number]  Date of birth: 01/24/1934   I have seen and evaluated this patient and I have discussed the plan of care with the house staff. Please see Dr. Durenda Age note for complete details. I concur with his findings with the following additions/corrections:   Patient has had an acute personality change with concerns about the devil, etc.  She has not had progressive memory or motor issues.  I tend to think this is an acute delirium, possibly acute grief reaction. She is having significant side effects with most psychoactive medications, so I tend to think we just need to allow medication wash out over the next 24-48 hours and monitor how she progresses.  She will certainly need something to help her sleep.   [name], MD 12/07/2016, 2:07 PM

## 2016-12-08 DIAGNOSIS — I10 Essential (primary) hypertension: Secondary | ICD-10-CM | POA: Diagnosis not present

## 2016-12-08 DIAGNOSIS — E119 Type 2 diabetes mellitus without complications: Secondary | ICD-10-CM | POA: Diagnosis not present

## 2016-12-08 DIAGNOSIS — E871 Hypo-osmolality and hyponatremia: Secondary | ICD-10-CM | POA: Diagnosis not present

## 2016-12-08 LAB — BASIC METABOLIC PANEL
Anion gap: 10 (ref 5–15)
BUN: 10 mg/dL (ref 6–20)
CALCIUM: 9.8 mg/dL (ref 8.9–10.3)
CO2: 24 mmol/L (ref 22–32)
CREATININE: 0.73 mg/dL (ref 0.44–1.00)
Chloride: 99 mmol/L — ABNORMAL LOW (ref 101–111)
GFR calc Af Amer: >60 mL/min (ref >60)
GLUCOSE: 174 mg/dL — AB (ref 65–99)
Potassium: 3.7 mmol/L (ref 3.5–5.1)
Sodium: 133 mmol/L — ABNORMAL LOW (ref 135–145)

## 2016-12-08 LAB — GLUCOSE, CAPILLARY
Glucose-Capillary: 185 mg/dL — ABNORMAL HIGH (ref 65–99)
Glucose-Capillary: 223 mg/dL — ABNORMAL HIGH (ref 65–99)
Glucose-Capillary: 206 mg/dL — ABNORMAL HIGH (ref 65–99)
Glucose-Capillary: 158 mg/dL — ABNORMAL HIGH (ref 65–99)

## 2016-12-08 MED ORDER — IRBESARTAN 300 MG PO TABS
300.0000 mg | ORAL_TABLET | Freq: Every day | ORAL | Status: DC
  Administered 2016-12-09 – 2016-12-10 (×2): 300 mg via ORAL
  Filled 2016-12-08 (×2): qty 1

## 2016-12-08 MED ORDER — HYDRALAZINE HCL 25 MG PO TABS
25.0000 mg | ORAL_TABLET | Freq: Four times a day (QID) | ORAL | Status: DC | PRN
  Administered 2016-12-08: 25 mg via ORAL
  Filled 2016-12-08: qty 1

## 2016-12-08 NOTE — Progress Notes (Signed)
Subjective:  No acute events overnight.  Pt was talking fluently this morning. However, per daughter, she did not sleep entire night. They refused Klonopin for sleep as they read about the side effects of Klonopin on the internet.   Pt feels very tired, and sleepy this morning, awaiting breakfast. Denies any cough.    Objective: Vital signs in last 24 hours: Vitals:   12/07/16 2032 12/07/16 2110 12/08/16 0442 12/08/16 0813  BP:  (!) 199/70 (!) 206/81   Pulse:  84 87   Resp:  18 20   Temp:  98.4 F (36.9 C) 98.8 F (37.1 C)   TempSrc:  Oral Oral   SpO2: 100% 100% 99% 99%  Weight:      Height:        Intake/Output Summary (Last 24 hours) at 12/08/16 0926 Last data filed at 12/08/16 0615  Gross per 24 hour  Intake              620 ml  Output                0 ml  Net              620 ml    Physical Exam General: Awake, sitting up , soft spoken, fluent speech today  Lungs: Clear to auscultation bilaterally, normal work of breathing Heart: RRR, systolic ejection murmur, no gallops or rubs.  Abdomen: Soft, non-tender, no distention, no guarding Extremities: Thin bulk and normal ROM Neurologic: Alert and oriented x 3  normal muscle tone,  No tremors in hands appreciated today  Psych: Blunted affect  Labs / Imaging / Procedures: CBC Latest Ref Rng & Units 12/04/2016 12/03/2016 11/30/2016  WBC 4.0 - 10.5 K/uL 10.2 6.2 7.2  Hemoglobin 12.0 - 15.0 g/dL 12.2 11.9(L) 13.5  Hematocrit 36.0 - 46.0 % 36.4 34.9(L) 39.8  Platelets 150 - 400 K/uL 356 288 331   BMP Latest Ref Rng & Units 12/08/2016 12/07/2016 12/06/2016  Glucose 65 - 99 mg/dL 174(H) 157(H) 124(H)  BUN 6 - 20 mg/dL 10 12 11   Creatinine 0.44 - 1.00 mg/dL 0.73 0.79 0.84  BUN/Creat Ratio 12 - 28 - - -  Sodium 135 - 145 mmol/L 133(L) 133(L) 136  Potassium 3.5 - 5.1 mmol/L 3.7 3.7 3.9  Chloride 101 - 111 mmol/L 99(L) 102 104  CO2 22 - 32 mmol/L 24 20(L) 23  Calcium 8.9 - 10.3 mg/dL 9.8 9.7 9.4   Dg Chest 2  View  Result Date: 12/07/2016 CLINICAL DATA:  Productive cough EXAM: CHEST  2 VIEW COMPARISON:  11/27/2016 FINDINGS: There is no focal parenchymal opacity. There is no pleural effusion or pneumothorax. The heart and mediastinal contours are unremarkable. Moderate osteoarthritis of bilateral shoulders. IMPRESSION: No active cardiopulmonary disease. Electronically Signed   By: Kathreen Devoid   On: 12/07/2016 12:05   Assessment/Plan:  Acute delirium, possibly acute grief reaction: Resolved today. Pt has fluent speech, and is A&O x 3. However,s he did not sleep entire night and refused klonopin so patient felt extremely tired. Have encouraged the patient and family to take klonopin at bedtime for improved sleep.   -imipramine 25 mg BID -Klonopin qhs PRn for insomnia - no haldol as pt has had significant EPS -she will benefit from lexapro   Hypertension, labile with SBP low 200s+ intermittently, seem to follow episodes of agitation rather than preceding them  -- Irbesartan 300 mg daily -- Amlodipine 10 mg  -- Metoprolol XL 50 mg  -- Imdur  30 mg   T2DM, : CBGs have been well controlled --Hold home Amaryl --SSI-S WC  Hyponatremia -stable at 133 -NaCl tablets    Dispo: Anticipated discharge in approximately 1 day(s).   LOS: 3 days   Christine Estelle, MD 12/08/2016, 9:26 AM Pager: [phone]

## 2016-12-08 NOTE — Progress Notes (Signed)
At 2110, b/p was 199/70.  Patient is asymptomatic.  MD made aware.  Orders received if systolic blood pressure exceeds 200.  Will continue to monitor patient.  [employer] RN-BC, WTA.

## 2016-12-08 NOTE — Progress Notes (Signed)
  Date: 12/08/2016  Patient name: Christine [name]  Medical record number: [id number]  Date of birth: 01/24/1934   I have seen and evaluated this patient and I have discussed the plan of care with the house staff. Please see Dr. Sherlynn Carbon note for complete details. I concur with his findings with the following additions/corrections:   I spoke with Christine [name] and her daughter Christine [name] this morning.  Patient had limited sleep overnight.  She did not have any more agitation, but Christine [name] thinks this may be because she did not sleep and the agitation seems to coincide with nightmares Christine [name] has.  Christine [name] today is similar to her baseline as I have seen her in the clinic, but she does appear tired and fatigued.  Christine [name], Christine [name] and I discussed three options: Start a low dose anxiolytic at bedtime to help with sleep, start an antidepressant, lexapro, at low dose to help with mood as she seems to be having mood fluctuations, or to continue wash out of medications and have her go home and attempt to sleep in her own bed to see if this helps.  Christine [name] is most amenable to the latter choice.  Christine [name] is concerned re: starting new medications, understandably, and also with taking her mom home given her acute agitation issues (none for 24 hours which is positive).  I advised them to think on the options and I will see them after lunch to discuss further.    [name], MD 12/08/2016, 11:40 AM

## 2016-12-09 ENCOUNTER — Other Ambulatory Visit: Payer: Self-pay | Admitting: Cardiology

## 2016-12-09 DIAGNOSIS — Z888 Allergy status to other drugs, medicaments and biological substances status: Secondary | ICD-10-CM

## 2016-12-09 DIAGNOSIS — Z880 Allergy status to penicillin: Secondary | ICD-10-CM

## 2016-12-09 DIAGNOSIS — R4182 Altered mental status, unspecified: Secondary | ICD-10-CM

## 2016-12-09 DIAGNOSIS — Z9889 Other specified postprocedural states: Secondary | ICD-10-CM

## 2016-12-09 DIAGNOSIS — F329 Major depressive disorder, single episode, unspecified: Secondary | ICD-10-CM

## 2016-12-09 DIAGNOSIS — Z90710 Acquired absence of both cervix and uterus: Secondary | ICD-10-CM

## 2016-12-09 DIAGNOSIS — R451 Restlessness and agitation: Secondary | ICD-10-CM

## 2016-12-09 DIAGNOSIS — Z8249 Family history of ischemic heart disease and other diseases of the circulatory system: Secondary | ICD-10-CM

## 2016-12-09 DIAGNOSIS — Z79899 Other long term (current) drug therapy: Secondary | ICD-10-CM

## 2016-12-09 DIAGNOSIS — Z800 Family history of malignant neoplasm of digestive organs: Secondary | ICD-10-CM

## 2016-12-09 DIAGNOSIS — Z794 Long term (current) use of insulin: Secondary | ICD-10-CM

## 2016-12-09 DIAGNOSIS — Z7951 Long term (current) use of inhaled steroids: Secondary | ICD-10-CM | POA: Diagnosis not present

## 2016-12-09 DIAGNOSIS — E039 Hypothyroidism, unspecified: Secondary | ICD-10-CM | POA: Diagnosis not present

## 2016-12-09 DIAGNOSIS — J449 Chronic obstructive pulmonary disease, unspecified: Secondary | ICD-10-CM | POA: Diagnosis not present

## 2016-12-09 DIAGNOSIS — K219 Gastro-esophageal reflux disease without esophagitis: Secondary | ICD-10-CM | POA: Diagnosis not present

## 2016-12-09 DIAGNOSIS — E871 Hypo-osmolality and hyponatremia: Secondary | ICD-10-CM | POA: Diagnosis not present

## 2016-12-09 DIAGNOSIS — I10 Essential (primary) hypertension: Secondary | ICD-10-CM | POA: Diagnosis not present

## 2016-12-09 DIAGNOSIS — E785 Hyperlipidemia, unspecified: Secondary | ICD-10-CM | POA: Diagnosis not present

## 2016-12-09 DIAGNOSIS — I2510 Atherosclerotic heart disease of native coronary artery without angina pectoris: Secondary | ICD-10-CM | POA: Diagnosis not present

## 2016-12-09 DIAGNOSIS — E119 Type 2 diabetes mellitus without complications: Secondary | ICD-10-CM | POA: Diagnosis not present

## 2016-12-09 LAB — GLUCOSE, CAPILLARY
GLUCOSE-CAPILLARY: 192 mg/dL — AB (ref 65–99)
GLUCOSE-CAPILLARY: 172 mg/dL — AB (ref 65–99)
GLUCOSE-CAPILLARY: 184 mg/dL — AB (ref 65–99)
Glucose-Capillary: 187 mg/dL — ABNORMAL HIGH (ref 65–99)

## 2016-12-09 LAB — BASIC METABOLIC PANEL
Anion gap: 10 (ref 5–15)
BUN: 15 mg/dL (ref 6–20)
CALCIUM: 9.9 mg/dL (ref 8.9–10.3)
CO2: 25 mmol/L (ref 22–32)
CREATININE: 0.99 mg/dL (ref 0.44–1.00)
Chloride: 99 mmol/L — ABNORMAL LOW (ref 101–111)
GFR calc non Af Amer: 52 mL/min — ABNORMAL LOW (ref >60)
Glucose, Bld: 213 mg/dL — ABNORMAL HIGH (ref 65–99)
Potassium: 4 mmol/L (ref 3.5–5.1)
SODIUM: 134 mmol/L — AB (ref 135–145)

## 2016-12-09 MED ORDER — IMIPRAMINE HCL 50 MG PO TABS: 50.0000 mg | ORAL_TABLET | Freq: Two times a day (BID) | ORAL | Status: DC

## 2016-12-09 NOTE — Progress Notes (Signed)
Patient's family called this RN into patient's room.  They were concerned about patient being restless and unable to sleep.  Upon assessment, patient is sitting on side of bed.  She is rocking back and forth and will periodically attempt to stand up.  Per family, they have walked her around the room multiple times.  Patient unable to express to this RN what we can do to help her.  Per family's request, I did take patient's blood pressure.  Her blood pressure at 0055 was 165/87.  I explained to family that I felt this was actually a little elevated due to patient's inability to hold her arm still.  Family will continue to stay with patient.  Nature music playing in the background and lights are dimmed to promote a sleep environment.  Will continue to monitor patient.  [employer] RN-BC, WTA.

## 2016-12-09 NOTE — Consult Note (Signed)
[place] [facility] Consult   Reason for Consult: agitation Referring Physician:  Dr. Larose Hires Patient Identification: CATALINA SALASAR MRN:  [id number] Principal Diagnosis: Agitation Diagnosis:   Patient Active Problem List   Diagnosis Date Noted  . Agitation [R45.1] 12/05/2016  . Dysarthria [R47.1] 12/01/2016  . MDD (major depressive disorder), recurrent severe, without psychosis ([place]) [F33.2] 11/30/2016  . Protein-calorie malnutrition, severe [E43] 11/30/2016  . Hypertensive urgency [I16.0] 11/27/2016  . Malnutrition of moderate degree [E44.0] 11/27/2016  . Altered mental status [R41.82]   . Appetite loss [R63.0]   . Constipation [K59.00]   . Hyponatremia [E87.1] 11/26/2016  . Elevated alkaline phosphatase level [R74.8] 09/18/2016  . Mixed incontinence urge and stress [N39.46] 05/09/2016  . Dizziness [R42] 04/11/2016  . Exertional dyspnea [R06.09] 09/11/2015  . Hypertensive retinopathy of both eyes, grade 2 [H35.033] 04/20/2015  . Lumbar spondylosis [M47.816] 06/16/2014  . Carpal tunnel syndrome [G56.00] 02/18/2014  . Right shoulder pain [M25.511] 02/02/2014  . Osteopenia [M85.80] 01/11/2013  . Routine adult health maintenance [Z00.00] 01/11/2013  . Chronic renal insufficiency, stage III (moderate) [N18.3] 09/21/2012  . Recurrent cough [R05] 09/18/2012  . Diverticulosis [K57.90] 09/18/2012  . Allergic rhinitis [J30.9] 02/13/2012  . Carotid stenosis [I65.29] 09/25/2011  . Coronary artery disease [I25.10] 08/29/2011  . HEART MURMUR, SYSTOLIC [M42.6] 83/41/9622  . Enlargement of clavicle [M89.319] 03/30/2008  . Solitary pulmonary nodule [R91.1] 12/15/2006  . Hyperlipidemia [E78.5] 11/28/2006  . Hypothyroidism [E03.9] 09/04/2006  . Type 2 diabetes mellitus, controlled ([place]) [E11.9] 09/04/2006  . Migraine [G43.909] 09/04/2006  . Essential hypertension [I10] 09/04/2006  . GERD [K21.9] 09/04/2006    Total Time spent with patient: 1 hour  Subjective:   NERY KALISZ is a 83 y.o. female patient admitted with Depression, decreased psychomotor activity.  HPI:  Neylan Koroma is an 83yo female with PMH of HTN, HLD, hypothyroidism, CAD, T2DM, COPD, CKD presenting to [facility] for nausea, vomiting, diarrhea and agitated behavior. Patient is seen, chart reviewed and case discussed with the Dr. Wynetta Emery and patient family members who were at bedside. Reportedly patient was suffering with increased symptoms of depression and questionable delirium during nighttime with restlessness and agitation, disturbed sleep and appetite and decreased psychomotor activity. Patient has flat affect. Patient received imipramine but later discontinued secondary to increased confusion and agitation. Patient is known to this provider from her previous set medical hospitalization secondary to hyponatremia and depression. Patient was given a trial of Effexor XR and hydroxyzine which she was tolerated but later came with more confusion and lack of sleep at nighttime. Patient also has extremely guilty about not participating her sister's funeral secondary to her medical sickness. Patient daughter and son has been extremely supportive to her. Patient responded very slowly and minimally during my evaluation.  Past Psychiatric History: Maj. depressive disorder, recurrent and was stable on imipramine 50 mg twice daily over 20-30 years in the past and received medication from the primary care physician. Patient has no history of acute psychiatric hospitalization.  Risk to Self: Is patient at risk for suicide?: No Risk to Others:   Prior Inpatient Therapy:   Prior Outpatient Therapy:    Past Medical History:  Past Medical History:  Diagnosis Date  . Anemia   . Anginal pain ([place])   . Arthritis    "back, arms, hips; hands" (11/30/2015)  . Benign hypertensive heart and kidney disease with diastolic CHF, NYHA class II and CKD stage III ([place])   . Blood transfusion 1972   "after daughter born,  attempted  to give me blood; couldn't give it cause my blood was cold" (09/23/2013)  . CAD (coronary artery disease)    a. LHC 08/23/11: dLM 40-50%, oRI 40%, oCFX 40%, oD1 70% (small and not amenable to PCI).  LM lesion did not appear to be flow limiting.  Medical rx was recommended;  b. Echo 08/23/11: mild LVH, EF 60-65%, grade 1 diast dysfxn, mild BAE, PASP 24;  c. 02/2012  Cath: LM 50d, LAD 50p, D1 70ost, RI 70p, RCA ok->Med Rx;  d. 01/2013 Cardiolite: EF 88, no ischemia/infarct.  . Carotid stenosis    a. dopplers 10/12:  0-39% bilat ICA;  b. 10/2012 U/S: 0-39% bilat, f/u 1 yr (10/2013).  . Chronic bronchitis   . Depression   . Diverticulosis of colon (without mention of hemorrhage)   . GERD (gastroesophageal reflux disease)   . Heart murmur, systolic    2-D echo in December 2011 showed a normal EF with grade 1 diastolic dysfunction, trivial pulmonary regurgitation and mildly elevated PA pressure at 37 mmHg probably secondary to her COPD.dynamic obstruction-mid cavity obliteration;  b. 02/2012 Echo: EF 60-65%, mild LVH, PASP 33mHg.  .Marland KitchenHistory of stomach ulcers 1970's  . Hyperlipidemia   . Hypertension   . Hypothyroidism   . Migraines    a. Next atypical symptoms in the past. Patient was started on Neurontin for possible neuropathic origin of her pain.  . Polymyalgia rheumatica ([place]   . Shortness of breath on exertion    "just related to angina >1 yr ago" (09/23/2013)  . Sinus arrhythmia   . Type II diabetes mellitus ([place]    a. On oral hypoglycemic agents.    Past Surgical History:  Procedure Laterality Date  . CARDIAC CATHETERIZATION N/A 09/26/2015   Procedure: Right/Left Heart Cath and Coronary Angiography;  Surgeon: DLarey Dresser MD;  [facility];  Service: Cardiovascular;  Laterality: N/A;  . CATARACT EXTRACTION W/ INTRAOCULAR LENS  IMPLANT, BILATERAL Bilateral 1990's  . LEFT HEART CATHETERIZATION WITH CORONARY ANGIOGRAM N/A 03/16/2012   Procedure: LEFT HEART CATHETERIZATION  WITH CORONARY ANGIOGRAM;  Surgeon: THillary Bow MD;  [facility];  Service: Cardiovascular;  Laterality: N/A;  . VAGINAL HYSTERECTOMY  1976   Family History:  Family History  Problem Relation Age of Onset  . Colon cancer Maternal Grandmother   . Heart attack Maternal Grandmother   . COPD Father   . Other Mother     died of unknown causes in her 273's Pt raised by grandmother.  . Pulmonary Hypertension Daughter    Family Psychiatric  History: Not significant  Social History:  History  Alcohol Use No    Comment: 11/29/2016 "Last drink 1967"     History  Drug Use No    Social History   Social History  . Marital status: Widowed    Spouse name: N/A  . Number of children: N/A  . Years of education: N/A   Social History Main Topics  . Smoking status: Never Smoker  . Smokeless tobacco: Never Used  . Alcohol use No     Comment: 11/29/2016 "Last drink 1967"  . Drug use: No  . Sexual activity: Not Currently   Other Topics Concern  . None   Social History Narrative   Never smoked[place]  Care for her daughter who has had a lung transplant. Retired dBuilding surveyor   Additional Social History:    Allergies:   Allergies  Allergen  Reactions  . Aspirin Swelling and Other (See Comments)    Angioedema  . Nsaids Other (See Comments)    "interacts with heart medications"  . Codeine Nausea And Vomiting and Other (See Comments)    "makes me deathly sick" - can take with nausea medicine   . Haldol [Haloperidol] Other (See Comments)    EPS - rigidity and dystonic reaction, very sensitive  . Other Other (See Comments)    MSG  . Penicillins Diarrhea    "real bad" Has patient had a PCN reaction causing immediate rash, facial/tongue/throat swelling, SOB or lightheadedness with hypotension: Yes Has patient had a PCN reaction causing severe rash involving mucus membranes or skin necrosis: No Has patient had a PCN reaction that required  hospitalization No Has patient had a PCN reaction occurring within the last 10 years: Yes If all of the above answers are "NO", then may proceed with Cephalosporin use.     Labs:  Results for orders placed or performed during the hospital encounter of 12/04/16 (from the past 48 hour(s))  Glucose, capillary     Status: Abnormal   Collection Time: 12/07/16 12:09 PM  Result Value Ref Range   Glucose-Capillary 237 (H) 65 - 99 mg/dL  Glucose, capillary     Status: Abnormal   Collection Time: 12/07/16  3:41 PM  Result Value Ref Range   Glucose-Capillary 174 (H) 65 - 99 mg/dL  Glucose, capillary     Status: Abnormal   Collection Time: 12/07/16  9:12 PM  Result Value Ref Range   Glucose-Capillary 144 (H) 65 - 99 mg/dL  Basic metabolic panel     Status: Abnormal   Collection Time: 12/08/16  4:38 AM  Result Value Ref Range   Sodium 133 (L) 135 - 145 mmol/L   Potassium 3.7 3.5 - 5.1 mmol/L   Chloride 99 (L) 101 - 111 mmol/L   CO2 24 22 - 32 mmol/L   Glucose, Bld 174 (H) 65 - 99 mg/dL   BUN 10 6 - 20 mg/dL   Creatinine, Ser 0.73 0.44 - 1.00 mg/dL   Calcium 9.8 8.9 - 10.3 mg/dL   GFR calc non Af Amer >60 >60 mL/min   GFR calc Af Amer >60 >60 mL/min    Comment: (NOTE) The eGFR has been calculated using the CKD EPI equation. This calculation has not been validated in all clinical situations. eGFR's persistently <60 mL/min signify possible Chronic Kidney Disease.    Anion gap 10 5 - 15  Glucose, capillary     Status: Abnormal   Collection Time: 12/08/16  8:02 AM  Result Value Ref Range   Glucose-Capillary 185 (H) 65 - 99 mg/dL  Glucose, capillary     Status: Abnormal   Collection Time: 12/08/16 11:37 AM  Result Value Ref Range   Glucose-Capillary 223 (H) 65 - 99 mg/dL  Glucose, capillary     Status: Abnormal   Collection Time: 12/08/16  4:52 PM  Result Value Ref Range   Glucose-Capillary 206 (H) 65 - 99 mg/dL  Glucose, capillary     Status: Abnormal   Collection Time: 12/08/16   8:13 PM  Result Value Ref Range   Glucose-Capillary 158 (H) 65 - 99 mg/dL  Glucose, capillary     Status: Abnormal   Collection Time: 12/09/16  8:19 AM  Result Value Ref Range   Glucose-Capillary 192 (H) 65 - 99 mg/dL  Basic metabolic panel     Status: Abnormal   Collection Time: 12/09/16 10:53 AM  Result Value Ref Range   Sodium 134 (L) 135 - 145 mmol/L   Potassium 4.0 3.5 - 5.1 mmol/L   Chloride 99 (L) 101 - 111 mmol/L   CO2 25 22 - 32 mmol/L   Glucose, Bld 213 (H) 65 - 99 mg/dL   BUN 15 6 - 20 mg/dL   Creatinine, Ser 0.99 0.44 - 1.00 mg/dL   Calcium 9.9 8.9 - 10.3 mg/dL   GFR calc non Af Amer 52 (L) >60 mL/min   GFR calc Af Amer >60 >60 mL/min    Comment: (NOTE) The eGFR has been calculated using the CKD EPI equation. This calculation has not been validated in all clinical situations. eGFR's persistently <60 mL/min signify possible Chronic Kidney Disease.    Anion gap 10 5 - 15    Current Facility-Administered Medications  Medication Dose Route Frequency Provider Last Rate Last Dose  . acetaminophen (TYLENOL) tablet 1,000 mg  1,000 mg Oral Q6H PRN Shela Leff, MD      . amLODipine (NORVASC) tablet 10 mg  10 mg Oral Daily Shela Leff, MD   10 mg at 12/09/16 1049  . atorvastatin (LIPITOR) tablet 20 mg  20 mg Oral QPM Asencion Partridge, MD   20 mg at 12/08/16 1727  . clopidogrel (PLAVIX) tablet 75 mg  75 mg Oral QPM Shela Leff, MD   75 mg at 12/08/16 1727  . enoxaparin (LOVENOX) injection 40 mg  40 mg Subcutaneous Q24H Shela Leff, MD   40 mg at 12/06/16 1434  . feeding supplement (ENSURE ENLIVE) (ENSURE ENLIVE) liquid 237 mL  237 mL Oral BID BM Shela Leff, MD   237 mL at 12/09/16 1000  . fluticasone (FLONASE) 50 MCG/ACT nasal spray 1 spray  1 spray Each Nare Daily PRN Shela Leff, MD      . hydrALAZINE (APRESOLINE) tablet 25 mg  25 mg Oral Q6H PRN Holley Raring, MD   25 mg at 12/08/16 0513  . hydroxypropyl methylcellulose / hypromellose (ISOPTO  TEARS / GONIOVISC) 2.5 % ophthalmic solution 1 drop  1 drop Both Eyes QHS Shela Leff, MD   1 drop at 12/08/16 2133  . insulin aspart (novoLOG) injection 0-9 Units  0-9 Units Subcutaneous TID WC Shela Leff, MD   2 Units at 12/09/16 0800  . irbesartan (AVAPRO) tablet 300 mg  300 mg Oral Q1400 [name], MD      . isosorbide mononitrate (IMDUR) 24 hr tablet 30 mg  30 mg Oral Daily Asencion Partridge, MD   30 mg at 12/08/16 0815  . loratadine (CLARITIN) tablet 10 mg  10 mg Oral Daily PRN Shela Leff, MD      . metoprolol succinate (TOPROL-XL) 24 hr tablet 50 mg  50 mg Oral Daily Shela Leff, MD   50 mg at 12/09/16 1049  . mometasone-formoterol (DULERA) 200-5 MCG/ACT inhaler 2 puff  2 puff Inhalation BID Shela Leff, MD   2 puff at 12/09/16 0932  . multivitamin with minerals tablet 1 tablet  1 tablet Oral Daily Shela Leff, MD   1 tablet at 12/08/16 1027  . nitroGLYCERIN (NITROSTAT) SL tablet 0.4 mg  0.4 mg Sublingual Q5 min PRN Shela Leff, MD      . pantoprazole (PROTONIX) EC tablet 40 mg  40 mg Oral Daily Shela Leff, MD   40 mg at 12/08/16 1027  . polycarbophil (FIBERCON) tablet 625 mg  625 mg Oral Daily Asencion Partridge, MD   625 mg at 12/08/16 1027  . sodium chloride tablet  1 g  1 g Oral Daily Asencion Partridge, MD   1 g at 12/08/16 1028  . vitamin C (ASCORBIC ACID) tablet 250 mg  250 mg Oral Daily Shela Leff, MD   250 mg at 12/08/16 1027    Musculoskeletal: Strength & Muscle Tone: decreased Gait & Station: unable to stand Patient leans: N/A  Psychiatric Specialty Exam: Physical Exam as per history and physical   ROS decreased psychomotor activity, he can increase confusion, disturbed sleep and appetite. No Fever-chills, No Headache, No changes with Vision or hearing, reports vertigo No problems swallowing food or Liquids, No Chest pain, Cough or Shortness of Breath, No Abdominal pain, No Nausea or Vommitting, Bowel movements are regular, No  Blood in stool or Urine, No dysuria, No new skin rashes or bruises, No new joints pains-aches,  No new weakness, tingling, numbness in any extremity, No recent weight gain or loss, No polyuria, polydypsia or polyphagia,   A full 10 point Review of Systems was done, except as stated above, all other Review of Systems were negative.  Blood pressure (!) 192/127, pulse 99, temperature 97.9 F (36.6 C), temperature source Oral, resp. rate 18, height _0  (1.626 m), weight 51.4 kg (113 lb 5.1 oz), last menstrual period 02/11/1975, SpO2 100 %.Body mass index is 19.45 kg/m.  General Appearance: Guarded  Eye Contact:  Fair  Speech:  Slow  Volume:  Decreased  Mood:  Depressed  Affect:  Depressed and Flat  Thought Process:  Coherent and Goal Directed  Orientation:  Full (Time, Place, and Person)  Thought Content:  Rumination  Suicidal Thoughts:  No  Homicidal Thoughts:  No  Memory:  Immediate;   Fair Recent;   Fair Remote;   Good  Judgement:  Fair  Insight:  Fair  Psychomotor Activity:  Psychomotor Retardation  Concentration:  Concentration: Fair and Attention Span: Fair  Recall:  [place] of Knowledge:  Good  Language:  Good  Akathisia:  Negative  Handed:  Right  AIMS (if indicated):     Assets:  Communication Skills Desire for Improvement Financial Resources/Insurance Housing Leisure Time Resilience Social Support Transportation  ADL's:  Impaired  Cognition:  Impaired,  Mild  Sleep:        Treatment Plan Summary: 83 years old female with chronic depressive disorder presented with increased confusion, agitation disturbed sleep and appetite and increased psychomotor retardation.  Reportedly patient received Haldol for agitation which resulted extrapyramidal symptoms Medication recommendations: Patient will be monitored without psychotropic medication tonight as per the primary team. Benztropine 0.5 mg twice daily and Seroquel 25 mg twice daily for EPS and  confusion Zyprexa resides 5 mg at bedtime for decreased appetite and sleep Daily contact with patient to assess and evaluate symptoms and progress in treatment and Medication management  Appreciate psychiatric consultation and we sign off as of today Please contact [phone] or [phone] if needs further assistance   Disposition: Recommend psychiatric Inpatient admission when medically cleared. Supportive therapy provided about ongoing stressors.  Ambrose Finland, MD 12/09/2016 11:48 AM

## 2016-12-09 NOTE — Care Management Important Message (Signed)
Important Message  Patient Details  Name: KIT SLEIMAN MRN: [id number] Date of Birth: 01/24/1934   Medicare Important Message Given:  Yes    Jamaal Bernasconi 12/09/2016, 11:13 AM

## 2016-12-09 NOTE — Progress Notes (Signed)
  Date: 12/09/2016  Patient name: Christine [name]  Medical record number: [id number]  Date of birth: 01/24/1934   I have seen and evaluated this patient and I have discussed the plan of care with the house staff. Please see Dr. Durenda Age note for complete details. I concur with his findings with the following additions/corrections:   Unclear diagnosis for Ms. Bensel at this time.  We as a team have been entertaining possible Parkinsonism, but acute PD would be unlikely in the setting of 3-4 weeks.  Possibly acute depression.  Psychiatry is going to see her today.  She is somewhat worse today.    [name], MD 12/09/2016, 4:02 PM

## 2016-12-09 NOTE — Progress Notes (Addendum)
Subjective: Limited sleep again overnight, daughter reports that somewhat shortly after taking her PM imipramine Ms. Berghoff began to feel restless, rocking back and forth, repeating religious phrases, pacing in the room. Family having a difficulty time redirecting or calming her down. Seemed to improved after she was allowed to pace/walk around. Patient continues to remain hypoactive with minimal conversational capacity this morning. Repeats "I am nothing without God's love" when asked about breakfast.  Objective: Vital signs in last 24 hours: Vitals:   12/08/16 2003 12/08/16 2015 12/09/16 0055 12/09/16 0553  BP:  (!) 167/71 (!) 165/87 (!) 195/87  Pulse:  84 90 98  Resp:  20  19  Temp:  98.5 F (36.9 C)  98.2 F (36.8 C)  TempSrc:  Oral  Oral  SpO2: 98% 100%  100%  Weight:  113 lb 5.1 oz (51.4 kg)    Height:        Intake/Output Summary (Last 24 hours) at 12/09/16 0901 Last data filed at 12/09/16 [id number]  Gross per 24 hour  Intake              690 ml  Output                0 ml  Net              690 ml    Physical Exam General: Awake, sitting up eating breakfast, soft spoken with blunt affect Head: Normocephalic, atraumatic, moist MM Lungs: Clear to auscultation bilaterally, normal work of breathing Heart: RRR, systolic ejection murmur, no gallops or rubs.  Abdomen: Soft, non-tender, no distention, no guarding Extremities: Thin bulk and normal ROM, no cyanosis, clubbing, edema Neurologic: Awake and oriented x3, naming and repetition intact, attention span and concentration impaired, recent memory intact, CNs II-XII although patient has diminished ability to smile or protrude tongue, tremor of tongue and lower facial muscles, muscle strength 4/5 throughout, some tremor present in arms bilaterally, sensation to light touch intact bilaterally, reflexes 2+ throughout, coordination with some mild dysmetria on FNF, HKS intact, gait deferred Skin: Turgor normal, warm, dry, and intact   Psych: Blunted affect  Labs / Imaging / Procedures: CBC Latest Ref Rng & Units 12/04/2016 12/03/2016 11/30/2016  WBC 4.0 - 10.5 K/uL 10.2 6.2 7.2  Hemoglobin 12.0 - 15.0 g/dL 12.2 11.9(L) 13.5  Hematocrit 36.0 - 46.0 % 36.4 34.9(L) 39.8  Platelets 150 - 400 K/uL 356 288 331   BMP Latest Ref Rng & Units 12/08/2016 12/07/2016 12/06/2016  Glucose 65 - 99 mg/dL 174(H) 157(H) 124(H)  BUN 6 - 20 mg/dL 10 12 11   Creatinine 0.44 - 1.00 mg/dL 0.73 0.79 0.84  BUN/Creat Ratio 12 - 28 - - -  Sodium 135 - 145 mmol/L 133(L) 133(L) 136  Potassium 3.5 - 5.1 mmol/L 3.7 3.7 3.9  Chloride 101 - 111 mmol/L 99(L) 102 104  CO2 22 - 32 mmol/L 24 20(L) 23  Calcium 8.9 - 10.3 mg/dL 9.8 9.7 9.4   No results found. Assessment/Plan:  Agitation and altered mental status Patient with new episodes of agitation in setting of medication change, multiple hospitalizations and acute grief. Patient was recently taken off of imipramine in setting of hyponatremia and started on venlafaxine and atarax for her depression and anxiety. Atarax was recently discontinued by [facility] in ED day prior to admission. Continues to have difficulties sleeping at night. Checked TSH, HIV, RPR - all nml. Daytime psych status seems improved on Imipramine. Continues to have poor sleep and wakens with  dysphoric thoughts at night. Avoid 1-st gen antipsychotics in future, prone to EPS. Had severe reaction to Haldol on 1/19. Patient also with masked face, bradykinesia, modest shuffling gait, postural instability, this may be Parkinson's disease. May consider trial of Sinemet. - Minimize psychoactive medications - Dc'd Imipramine 25 mg BID today [facility] consult, appreciate recommendations  Nausea, abdominal pain, diarrhea, resolved, patient had acute onset of abdominal symptoms hours before presentation to ED. C diff negative. Recent accelerated bowel regimen during hospital stay due to severe constipation, may have overcorrected. -- Supportive care with zofran  PRN -- Holding laxative medications -- home Fibercon  Hypertension, labile with SBP 200+ intermittently -- Irbesartan 300 mg daily -- Amlodipine 10 mg  -- Metoprolol 50 mg  -- Imdur 30 mg   Hyponatremia, patient with hyponatremia 2/2 "tea and toast" diet. Sodium normalized.  --NaCL 1g tabs QD --Ensure supplements BID between meals --Trend BMP  COPD, no symptoms, continue home Advair, Claritin, Flonase  T2DM, on minimal oral medications.  --Hold home Amaryl --SSI-S WC  Hypothyroidism, TSH 2.136 on 11/26/2016 - holding home levothyroxine 52mcg daily  CAD, patient with LHC on 11/16 showing stable coronary disease with 40-50% L main stenosis and 70-70% D1 stenosis. No active chest pain, shortness of breath different from baseline, EKG with possible PAC's without acute ischemic changes. -- Plavix 75mg  daily -- atorvastatin reduced dose to 20mg  daily  HLD -- atorvastatin 20mg  daily.  GERD -- protonix 40mg  daily  Dispo: Anticipated discharge in approximately 1-2 day(s).   LOS: 4 days   Asencion Partridge, MD 12/09/2016, 9:01 AM Pager: [phone]

## 2016-12-10 ENCOUNTER — Inpatient Hospital Stay (HOSPITAL_COMMUNITY): Payer: Medicare Other

## 2016-12-10 DIAGNOSIS — K582 Mixed irritable bowel syndrome: Secondary | ICD-10-CM

## 2016-12-10 DIAGNOSIS — R410 Disorientation, unspecified: Secondary | ICD-10-CM

## 2016-12-10 DIAGNOSIS — R258 Other abnormal involuntary movements: Secondary | ICD-10-CM

## 2016-12-10 DIAGNOSIS — K219 Gastro-esophageal reflux disease without esophagitis: Secondary | ICD-10-CM | POA: Diagnosis not present

## 2016-12-10 DIAGNOSIS — E871 Hypo-osmolality and hyponatremia: Secondary | ICD-10-CM | POA: Diagnosis not present

## 2016-12-10 DIAGNOSIS — R4182 Altered mental status, unspecified: Secondary | ICD-10-CM | POA: Diagnosis not present

## 2016-12-10 DIAGNOSIS — E785 Hyperlipidemia, unspecified: Secondary | ICD-10-CM | POA: Diagnosis not present

## 2016-12-10 DIAGNOSIS — I10 Essential (primary) hypertension: Secondary | ICD-10-CM | POA: Diagnosis not present

## 2016-12-10 DIAGNOSIS — J449 Chronic obstructive pulmonary disease, unspecified: Secondary | ICD-10-CM | POA: Diagnosis not present

## 2016-12-10 DIAGNOSIS — I2510 Atherosclerotic heart disease of native coronary artery without angina pectoris: Secondary | ICD-10-CM | POA: Diagnosis not present

## 2016-12-10 DIAGNOSIS — R451 Restlessness and agitation: Secondary | ICD-10-CM | POA: Diagnosis not present

## 2016-12-10 DIAGNOSIS — E119 Type 2 diabetes mellitus without complications: Secondary | ICD-10-CM | POA: Diagnosis not present

## 2016-12-10 DIAGNOSIS — E039 Hypothyroidism, unspecified: Secondary | ICD-10-CM | POA: Diagnosis not present

## 2016-12-10 LAB — GLUCOSE, CAPILLARY
GLUCOSE-CAPILLARY: 215 mg/dL — AB (ref 65–99)
GLUCOSE-CAPILLARY: 176 mg/dL — AB (ref 65–99)
Glucose-Capillary: 232 mg/dL — ABNORMAL HIGH (ref 65–99)
Glucose-Capillary: 153 mg/dL — ABNORMAL HIGH (ref 65–99)

## 2016-12-10 LAB — BASIC METABOLIC PANEL
ANION GAP: 10 (ref 5–15)
BUN: 13 mg/dL (ref 6–20)
CALCIUM: 10.2 mg/dL (ref 8.9–10.3)
CO2: 24 mmol/L (ref 22–32)
CREATININE: 0.86 mg/dL (ref 0.44–1.00)
Chloride: 97 mmol/L — ABNORMAL LOW (ref 101–111)
GFR calc Af Amer: >60 mL/min (ref >60)
GLUCOSE: 207 mg/dL — AB (ref 65–99)
Potassium: 4 mmol/L (ref 3.5–5.1)
Sodium: 131 mmol/L — ABNORMAL LOW (ref 135–145)

## 2016-12-10 LAB — CBC
HCT: 38.3 % (ref 36.0–46.0)
Hemoglobin: 13.1 g/dL (ref 12.0–15.0)
MCH: 30 pg (ref 26.0–34.0)
MCHC: 34.2 g/dL (ref 30.0–36.0)
MCV: 87.6 fL (ref 78.0–100.0)
PLATELETS: 432 10*3/uL — AB (ref 150–400)
RBC: 4.37 MIL/uL (ref 3.87–5.11)
RDW: 13.8 % (ref 11.5–15.5)
WBC: 8.9 10*3/uL (ref 4.0–10.5)

## 2016-12-10 MED ORDER — CLONIDINE HCL 0.1 MG/24HR TD PTWK
0.1000 mg | MEDICATED_PATCH | TRANSDERMAL | Status: DC
Start: 2016-12-10 — End: 2016-12-11
  Filled 2016-12-10: qty 1

## 2016-12-10 MED ORDER — CARBIDOPA-LEVODOPA 25-100 MG PO TABS
1.0000 | ORAL_TABLET | Freq: Three times a day (TID) | ORAL | Status: DC
  Administered 2016-12-10 – 2016-12-11 (×4): 1 via ORAL
  Filled 2016-12-10 (×6): qty 1

## 2016-12-10 MED ORDER — POLYETHYLENE GLYCOL 3350 17 G PO PACK
17.0000 g | PACK | Freq: Every day | ORAL | Status: DC | PRN
  Administered 2016-12-10 – 2016-12-11 (×2): 17 g via ORAL
  Filled 2016-12-10 (×2): qty 1

## 2016-12-10 MED ORDER — CLONIDINE HCL 0.1 MG/24HR TD PTWK
0.1000 mg | MEDICATED_PATCH | TRANSDERMAL | Status: DC
  Filled 2016-12-10: qty 1

## 2016-12-10 MED ORDER — LORAZEPAM 0.5 MG PO TABS
0.5000 mg | ORAL_TABLET | Freq: Once | ORAL | Status: DC | PRN
  Filled 2016-12-10: qty 1

## 2016-12-10 MED ORDER — SENNA 8.6 MG PO TABS: 1.0000 | ORAL_TABLET | Freq: Every evening | ORAL | Status: DC | PRN

## 2016-12-10 NOTE — Progress Notes (Signed)
Subjective: Discontinued Imipramine yesterday. No acute events overnight, daughter reports she slept 3.5 hrs and seems tired this morning but had no episodes of agitation. Christine [name] is maintaining a rigid posture, hardly speaks, and does not seems able to lay down in bed. Her bradykinesis and hypoactivity seem worse than previous. Discussed with family extensively our concern that her symptoms are consistent with Parkinsonism and that a trial of Sinemet would be an ideal next step.  Objective: Vital signs in last 24 hours: Vitals:   12/09/16 0958 12/09/16 1710 12/09/16 1948 12/09/16 2203  BP: (!) 192/127 (!) 170/77  (!) 189/78  Pulse: 99 90  84  Resp: 18 16    Temp: 97.9 F (36.6 C) 97.8 F (36.6 C)  98.4 F (36.9 C)  TempSrc: Oral Axillary  Oral  SpO2:  98% 99% 100%  Weight:      Height:        Intake/Output Summary (Last 24 hours) at 12/10/16 0742 Last data filed at 12/09/16 1400  Gross per 24 hour  Intake              360 ml  Output                0 ml  Net              360 ml    Physical Exam General: Awake, nonverbal, sitting hunched in bed, no expression Head: Normocephalic, atraumatic, moist MM Lungs: Clear to auscultation bilaterally, normal work of breathing Heart: RRR, systolic ejection murmur, no gallops or rubs.  Abdomen: Soft, non-tender, no distention, no guarding Extremities: Thin bulk and normal ROM, no cyanosis, clubbing, edema Neurologic: Awake but nonverbal, minimal expression and response to questioning, resting tremor in arms/hands bilaterally Skin: Turgor normal, warm, dry, and intact  Psych: Blunted affect  Labs / Imaging / Procedures: CBC Latest Ref Rng & Units 12/10/2016 12/04/2016 12/03/2016  WBC 4.0 - 10.5 K/uL 8.9 10.2 6.2  Hemoglobin 12.0 - 15.0 g/dL 13.1 12.2 11.9(L)  Hematocrit 36.0 - 46.0 % 38.3 36.4 34.9(L)  Platelets 150 - 400 K/uL 432(H) 356 288   BMP Latest Ref Rng & Units 12/10/2016 12/09/2016 12/08/2016  Glucose 65 - 99 mg/dL 207(H)  213(H) 174(H)  BUN 6 - 20 mg/dL 13 15 10   Creatinine 0.44 - 1.00 mg/dL 0.86 0.99 0.73  BUN/Creat Ratio 12 - 28 - - -  Sodium 135 - 145 mmol/L 131(L) 134(L) 133(L)  Potassium 3.5 - 5.1 mmol/L 4.0 4.0 3.7  Chloride 101 - 111 mmol/L 97(L) 99(L) 99(L)  CO2 22 - 32 mmol/L 24 25 24   Calcium 8.9 - 10.3 mg/dL 10.2 9.9 9.8   No results found. Assessment/Plan:  Agitation and altered mental status, resolving, now predominantly bradykinesis Patient with new episodes of agitation in setting of medication changes, multiple hospitalizations and acute grief. Patient was recently taken off of Imipramine in setting of hyponatremia and started on venlafaxine and atarax for her depression and anxiety. Atarax was recently discontinued by [facility] in ED day prior to admission. Continues to have difficulties sleeping at night. Had severe EPS/dystonic reaction to Haldol on 1/19. Restarted Imipramine which did not improve her nighttime agitation symptoms. Stopped all psych meds on 1/22 and patient remains bradykinetic, worsening masked face, bradykinesia, modest shuffling gait, postural instability, this may be Parkinson's disease. - Trial Levodopa-Carbidopa (Sinemet) 25-100 mg TID starting today  - Repeat MRI brain w/wo contrast today, worsening hypoactivity, patient with history of rapid Na correction approx 2  weeks ago, previous MRI on 1/13 motion-degraded, needs re-eval for ODS/CPM - [facility] consulted, appreciate recs, may require Seroquel or inpatient psych in future  Constipation, patient with IBS and alternating constipation or diarrhea, also poor po intake. Family report no BM in several days -- Added home Miralax and Senna PRN -- home Fibercon  Hypertension, labile with SBP 200+ intermittently -- If continues to refuse BP meds and SBP 200+ then start clonidine patch 0.1 mg -- Irbesartan 300 mg daily -- Amlodipine 10 mg  -- Metoprolol 50 mg  -- Imdur 30 mg   Hyponatremia, patient with hyponatremia 2/2 "tea and  toast" diet. Sodium normalized.  --NaCL 1g tabs QD --Ensure supplements BID between meals --Trend BMP  COPD, no symptoms, continue home Advair, Claritin, Flonase  T2DM, on minimal oral medications.  --Hold home Amaryl --SSI-S WC  Hypothyroidism, TSH 2.136 on 11/26/2016 - holding home levothyroxine 55mcg daily  CAD, patient with LHC on 11/16 showing stable coronary disease with 40-50% L main stenosis and 70-70% D1 stenosis. No active chest pain, shortness of breath different from baseline, EKG with possible PAC's without acute ischemic changes. -- Plavix 75mg  daily -- atorvastatin reduced dose to 20mg  daily  HLD -- atorvastatin 20mg  daily.  GERD -- protonix 40mg  daily  Dispo: Anticipated discharge in approximately 2-3 day(s).   LOS: 5 days   Christine Partridge, MD 12/10/2016, 7:42 AM Pager: [phone]

## 2016-12-10 NOTE — Progress Notes (Signed)
  Date: 12/10/2016  Patient name: Christine [name]  Medical record number: [id number]  Date of birth: 01/24/1934   I have seen and evaluated this patient and I have discussed the plan of care with the house staff. Please see Dr. Durenda Age note for complete details. I concur with his findings with the following additions/corrections:   Again feel that parkinsonism may be an issue here.  She possibly has had subtle symptoms ongoing for a while with soft voice and somewhat decreased facial expression. This episode could then represent a parkinsonian crisis.  However, she also had the acute hyponatremia earlier this month.  It was felt to be an acute decrease and not chronic issue based on labs available.  Her Na rose by 10 at the most, however, if this was a more chronic (2-3 months) low Na, possibly she could have corrected too quickly.  Evaluation for this would be an MRI.  Will have patient have MRI today.  We discussed at length with family about starting a trial of Sinemet and they are considering this option.   MRI done showed no findings of extrapontine myelinosis.  Trial of sinemet today.   [name], MD 12/10/2016, 12:57 PM

## 2016-12-10 NOTE — Progress Notes (Signed)
12/10/2016 11:08 AM  Paged MD Wynetta Emery in regard to patient MRI and PRN ativan orders. Patient family member stated that patient will not take oral medication and requested that I not give it to her at this time. Informed both patient and daughter about Ativan and what the medication purpose was. Daughter verbalized understanding but then called her sister on the phone who requested to speak to me on the phone and told me to not give her mother that medication either. Stated that MD Daryll Drown said she was not allowed to have that medication. Informed both daughters via phone and in room that MD Daryll Drown and her residents are the MDs that ordered the medication. She verbalized that she was unaware of the medication being ordered and requested it not be given still.   Paged MD Wynetta Emery and alerted him of the situation. Instructed to page him back when the other daughter is at bedside, who appears to make all medical decision. Will alert MRI. IV team being consulted at this time due to two failed attempts at restarting IV for MRI. Will continue to assess and monitor the patient.   Edmond Ginsberg [employer], RN-BC [employer] Phone [phone]

## 2016-12-11 DIAGNOSIS — I951 Orthostatic hypotension: Secondary | ICD-10-CM

## 2016-12-11 DIAGNOSIS — R4182 Altered mental status, unspecified: Secondary | ICD-10-CM | POA: Diagnosis not present

## 2016-12-11 DIAGNOSIS — R258 Other abnormal involuntary movements: Secondary | ICD-10-CM | POA: Diagnosis not present

## 2016-12-11 DIAGNOSIS — E785 Hyperlipidemia, unspecified: Secondary | ICD-10-CM | POA: Diagnosis not present

## 2016-12-11 DIAGNOSIS — K582 Mixed irritable bowel syndrome: Secondary | ICD-10-CM | POA: Diagnosis not present

## 2016-12-11 DIAGNOSIS — I2510 Atherosclerotic heart disease of native coronary artery without angina pectoris: Secondary | ICD-10-CM | POA: Diagnosis not present

## 2016-12-11 DIAGNOSIS — R451 Restlessness and agitation: Secondary | ICD-10-CM | POA: Diagnosis not present

## 2016-12-11 DIAGNOSIS — E119 Type 2 diabetes mellitus without complications: Secondary | ICD-10-CM | POA: Diagnosis not present

## 2016-12-11 DIAGNOSIS — J449 Chronic obstructive pulmonary disease, unspecified: Secondary | ICD-10-CM | POA: Diagnosis not present

## 2016-12-11 DIAGNOSIS — E871 Hypo-osmolality and hyponatremia: Secondary | ICD-10-CM | POA: Diagnosis not present

## 2016-12-11 DIAGNOSIS — I10 Essential (primary) hypertension: Secondary | ICD-10-CM | POA: Diagnosis not present

## 2016-12-11 DIAGNOSIS — E039 Hypothyroidism, unspecified: Secondary | ICD-10-CM | POA: Diagnosis not present

## 2016-12-11 LAB — BASIC METABOLIC PANEL
ANION GAP: 10 (ref 5–15)
BUN: 34 mg/dL — ABNORMAL HIGH (ref 6–20)
CALCIUM: 9.9 mg/dL (ref 8.9–10.3)
CO2: 24 mmol/L (ref 22–32)
Chloride: 101 mmol/L (ref 101–111)
Creatinine, Ser: 1.75 mg/dL — ABNORMAL HIGH (ref 0.44–1.00)
GFR calc Af Amer: 31 mL/min — ABNORMAL LOW (ref >60)
GFR, EST NON AFRICAN AMERICAN: 26 mL/min — AB (ref >60)
GLUCOSE: 127 mg/dL — AB (ref 65–99)
Potassium: 4.3 mmol/L (ref 3.5–5.1)
Sodium: 135 mmol/L (ref 135–145)

## 2016-12-11 LAB — GLUCOSE, CAPILLARY
Glucose-Capillary: 155 mg/dL — ABNORMAL HIGH (ref 65–99)
Glucose-Capillary: 320 mg/dL — ABNORMAL HIGH (ref 65–99)
Glucose-Capillary: 110 mg/dL — ABNORMAL HIGH (ref 65–99)
Glucose-Capillary: 147 mg/dL — ABNORMAL HIGH (ref 65–99)

## 2016-12-11 MED ORDER — GLUCERNA SHAKE PO LIQD
237.0000 mL | Freq: Two times a day (BID) | ORAL | Status: DC
  Administered 2016-12-11 – 2016-12-12 (×3): 237 mL via ORAL

## 2016-12-11 MED ORDER — IRBESARTAN 75 MG PO TABS
150.0000 mg | ORAL_TABLET | Freq: Every day | ORAL | Status: DC
  Administered 2016-12-11: 150 mg via ORAL
  Filled 2016-12-11 (×3): qty 2

## 2016-12-11 MED ORDER — METOPROLOL SUCCINATE ER 25 MG PO TB24
25.0000 mg | ORAL_TABLET | Freq: Every day | ORAL | Status: DC
  Administered 2016-12-13: 25 mg via ORAL
  Filled 2016-12-11 (×2): qty 1

## 2016-12-11 MED ORDER — CARBIDOPA-LEVODOPA 25-100 MG PO TABS
1.0000 | ORAL_TABLET | Freq: Four times a day (QID) | ORAL | Status: DC
  Administered 2016-12-11 – 2016-12-12 (×3): 1 via ORAL
  Filled 2016-12-11 (×5): qty 1

## 2016-12-11 MED ORDER — CARBIDOPA-LEVODOPA 25-100 MG PO TABS
1.0000 | ORAL_TABLET | Freq: Four times a day (QID) | ORAL | Status: DC
  Filled 2016-12-11 (×2): qty 1

## 2016-12-11 MED ORDER — ENOXAPARIN SODIUM 30 MG/0.3ML ~~LOC~~ SOLN
30.0000 mg | SUBCUTANEOUS | Status: DC
  Administered 2016-12-11 – 2016-12-13 (×3): 30 mg via SUBCUTANEOUS
  Filled 2016-12-11 (×3): qty 0.3

## 2016-12-11 MED ORDER — CARBIDOPA-LEVODOPA 25-100 MG PO TABS
1.0000 | ORAL_TABLET | Freq: Four times a day (QID) | ORAL | Status: DC
  Filled 2016-12-11: qty 1

## 2016-12-11 NOTE — Progress Notes (Signed)
Nutrition Brief Note  RN contacted RD regarding elevated CBGs and the request to switch Ensure shakes to Glucerna Shake instead. RD to modify order. Full nutrition assessment to follow 1/25.   Corrin Parker, MS, RD, LDN Pager # [phone] After hours/ weekend pager # [phone]

## 2016-12-11 NOTE — Evaluation (Signed)
Physical Therapy Evaluation Patient Details Name: Christine [name] MRN: [id number] DOB: 01/24/1934 Today's Date: 12/11/2016   History of Present Illness  Christine [name] is an 83yo female with PMH of HTN, HLD, hypothyroidism, CAD, T2DM, COPD, CKD presenting to [facility] for nausea, vomiting, diarrhea and agitated behavior.  Clinical Impression  Pt admitted with/for N/V/D and agitated behaviors.  Pt currently limited functionally due to the problems listed below.  (see problems list.)  Pt will benefit from PT to maximize function and safety to be able to get home safely with available assist of family.     Follow Up Recommendations Home health PT    Equipment Recommendations  None recommended by PT    Recommendations for Other Services       Precautions / Restrictions Precautions Precautions: None      Mobility  Bed Mobility               General bed mobility comments: at EOB on arrival  Transfers Overall transfer level: Needs assistance Equipment used: None Transfers: Sit to/from Stand Sit to Stand: Min assist         General transfer comment: cues to scoot forward, pt not getting her weight translated forward over her BOS and falling backward onto bed first few attempts  Ambulation/Gait Ambulation/Gait assistance: Min assist Ambulation Distance (Feet): 400 Feet Assistive device: 1 person hand held assist Gait Pattern/deviations: Step-through pattern;Drifts right/left   Gait velocity interpretation: at or above normal speed for age/gender General Gait Details: drifts left and scissors trying to maintain balance if not stabilized during gait.  Stairs            Wheelchair Mobility    Modified Rankin (Stroke Patients Only)       Balance Overall balance assessment: Needs assistance Sitting-balance support: Feet supported;No upper extremity supported Sitting balance-Leahy Scale: Fair     Standing balance support: During functional activity;No upper extremity  supported Standing balance-Leahy Scale: Fair                               Pertinent Vitals/Pain Pain Assessment: No/denies pain    Home Living Family/patient expects to be discharged to:: Private residence Living Arrangements: Children Available Help at Discharge: Family;Available PRN/intermittently Type of Home: House Home Access: Level entry     Home Layout: One level Home Equipment: None      Prior Function Level of Independence: Independent               Hand Dominance   Dominant Hand: Right    Extremity/Trunk Assessment   Upper Extremity Assessment Upper Extremity Assessment: Defer to OT evaluation    Lower Extremity Assessment Lower Extremity Assessment: Generalized weakness (cog-wheel at times)    Cervical / Trunk Assessment Cervical / Trunk Assessment: Kyphotic  Communication   Communication: No difficulties  Cognition Arousal/Alertness: Awake/alert Behavior During Therapy: Flat affect Overall Cognitive Status: Impaired/Different from baseline Area of Impairment: Awareness;Safety/judgement;Problem solving;Attention;Orientation Orientation Level: Situation;Time;Place Current Attention Level: Focused;Sustained     Safety/Judgement: Decreased awareness of safety;Decreased awareness of deficits Awareness: Intellectual Problem Solving: Slow processing;Decreased initiation;Difficulty sequencing      General Comments      Exercises     Assessment/Plan    PT Assessment Patient needs continued PT services  PT Problem List Decreased strength;Decreased activity tolerance;Decreased mobility;Decreased balance;Decreased safety awareness;Decreased knowledge of precautions          PT Treatment Interventions Gait training;Functional mobility training;Therapeutic  activities;Balance training;Patient/family education    PT Goals (Current goals can be found in the Care Plan section)  Acute Rehab PT Goals Patient Stated Goal: pt unable PT  Goal Formulation: Patient unable to participate in goal setting Time For Goal Achievement: 12/25/16 Potential to Achieve Goals: Good    Frequency Min 3X/week   Barriers to discharge        Co-evaluation               End of Session   Activity Tolerance: Patient tolerated treatment well Patient left: with call bell/phone within reach;with family/visitor present;in chair;with chair alarm set Nurse Communication: Mobility status         Time: [id number] PT Time Calculation (min) (ACUTE ONLY): 29 min   Charges:   PT Evaluation $PT Eval Moderate Complexity: 1 Procedure PT Treatments $Gait Training: 8-22 mins   PT G CodesTessie Fass Fredric Slabach 12/11/2016, 5:08 PM  12/11/2016  Donnella Sham, PT [phone]  (pager)

## 2016-12-11 NOTE — Progress Notes (Signed)
Patient refused last med pass, night shift RN made aware

## 2016-12-11 NOTE — Progress Notes (Signed)
  Date: 12/11/2016  Patient name: Christine [name]  Medical record number: [id number]  Date of birth: 01/24/1934   I have seen and evaluated this patient and I have discussed the plan of care with the house staff. Please see Dr. Durenda Age note for complete details. I concur with his findings with the following additions/corrections:   Spoke briefly with Dr. Shon Hale in Neurology this afternoon.  DDx should include tardive dyskinesia from administration of haldol earlier in hospital stay.  Should also consider unmasking of PD with the initiation of the antipsychotic.  I favor the latter given my reflection on this case.  Will attempt to uptitrate sinemet to 4 times a day as she does seem to be having some improvement with it.  She is also having orthostatic hypotension and dizziness which is a known side effect of the medication.  Could also try benadryl, but family has been hesitant to try this type of medication again given her reaction to the medication hydroxyzine.    [name], MD 12/11/2016, 5:24 PM

## 2016-12-11 NOTE — Progress Notes (Signed)
Subjective: Ms. Paladino was much improved early this morning per family. She seems to be responding to the Sinemet, which she last received at 330 AM. She slept well overnight, no episodes of agitation. Was more awake and physically/mentally active early on, ate some cereal and was writing things down in her notebook. We supposed to receive AM dose but family refused due to confusion over the medication. By the time of our evaluation in the late AM Ms. Jarvi was again very hypokinetic and slow to respond, sitting in bed.  Later in AM Ms. Beliveau seems tired, sleeping in bed. Wakes and answers questions. Feels that she needs to get up and do thing but also does not want to. Also experiencing dizziness/ orthostasis and flashes of hot/cold.  Objective: Vital signs in last 24 hours: Vitals:   12/10/16 2105 12/10/16 2340 12/11/16 0539 12/11/16 0841  BP:  (!) 141/51 (!) 144/50   Pulse:  83 79   Resp:   (!) 21   Temp:   97.6 F (36.4 C)   TempSrc:   Oral   SpO2: 97% 100% 100% 100%  Weight:      Height:        Intake/Output Summary (Last 24 hours) at 12/11/16 0958 Last data filed at 12/11/16 0601  Gross per 24 hour  Intake              260 ml  Output                0 ml  Net              260 ml    Physical Exam General: Awake, sitting up eating breakfast, soft spoken with blunt affect Head: Normocephalic, atraumatic, moist MM Lungs: Clear to auscultation bilaterally, normal work of breathing Heart: RRR, systolic ejection murmur, no gallops or rubs.  Abdomen: Soft, non-tender, no distention, no guarding Extremities: Thin bulk and normal ROM, no cyanosis, clubbing, edema Neurologic: Awake and oriented x3, naming and repetition intact, attention span and concentration impaired, recent memory intact, CNs II-XII although patient has diminished ability to smile or protrude tongue, tremor of tongue and lower facial muscles, muscle strength 4/5 throughout, some tremor present in arms bilaterally,  sensation to light touch intact bilaterally, reflexes 2+ throughout, coordination with some mild dysmetria on FNF, HKS intact, gait deferred Skin: Turgor normal, warm, dry, and intact  Psych: Blunted affect  Labs / Imaging / Procedures: CBC Latest Ref Rng & Units 12/10/2016 12/04/2016 12/03/2016  WBC 4.0 - 10.5 K/uL 8.9 10.2 6.2  Hemoglobin 12.0 - 15.0 g/dL 13.1 12.2 11.9(L)  Hematocrit 36.0 - 46.0 % 38.3 36.4 34.9(L)  Platelets 150 - 400 K/uL 432(H) 356 288   BMP Latest Ref Rng & Units 12/11/2016 12/10/2016 12/09/2016  Glucose 65 - 99 mg/dL 127(H) 207(H) 213(H)  BUN 6 - 20 mg/dL 34(H) 13 15  Creatinine 0.44 - 1.00 mg/dL 1.75(H) 0.86 0.99  BUN/Creat Ratio 12 - 28 - - -  Sodium 135 - 145 mmol/L 135 131(L) 134(L)  Potassium 3.5 - 5.1 mmol/L 4.3 4.0 4.0  Chloride 101 - 111 mmol/L 101 97(L) 99(L)  CO2 22 - 32 mmol/L 24 24 25   Calcium 8.9 - 10.3 mg/dL 9.9 10.2 9.9   Mr Brain Wo Contrast  Result Date: 12/10/2016 CLINICAL DATA:  Suspected Parkinson's, rigid posture with worsening bradykinesis. History of hypertension, polymyalgia rheumatica, diabetes. EXAM: MRI HEAD WITHOUT CONTRAST TECHNIQUE: Multiplanar, multiecho pulse sequences of the brain and surrounding structures were  obtained without intravenous contrast. Due to patient motion, contrast not administered. COMPARISON:  MRI head November 30, 2016 and CT HEAD December 05, 2016 FINDINGS: Moderate to severely motion degraded examination. Fast sequences E performed. BRAIN: No reduced diffusion to suggest acute ischemia. No susceptibility artifact to suggest hemorrhage. The ventricles and sulci are normal for patient's age. Patchy supratentorial white matter FLAIR T2 hyperintensities compatible with mild chronic small vessel ischemic disease, less than expected for age. No suspicious parenchymal signal, masses or mass effect. No abnormal extra-axial fluid collections. No extra-axial masses though, contrast enhanced sequences would be more sensitive.  VASCULAR: Normal major intracranial vascular flow voids present at skull base. SKULL AND UPPER CERVICAL SPINE: No abnormal sellar expansion. No suspicious calvarial bone marrow signal. Craniocervical junction maintained. SINUSES/ORBITS: Trace RIGHT posterior ethmoid mucosal thickening. Mastoid air cells are well aerated. The included ocular globes and orbital contents are non-suspicious, status post bilateral ocular lens implants. OTHER: None. IMPRESSION: Moderate to severely motion degraded examination: Stable negative noncontrast MRI head for age. Electronically Signed   By: Elon Alas M.D.   On: 12/10/2016 17:54   Assessment/Plan:  Agitation and altered mental status, resolving, now predominantly bradykinesis Patient with new episodes of agitation in setting of medication changes, multiple hospitalizations and acute grief. Patient was recently taken off of Imipramine in setting of hyponatremia and started on venlafaxine and atarax for her depression and anxiety. Atarax was recently discontinued by [facility] in ED day prior to admission. Continues to have difficulties sleeping at night. Had severe EPS/dystonic reaction to Haldol on 1/19. Restarted Imipramine which did not improve her nighttime agitation symptoms. Stopped all psych meds on 1/22 and patient remains bradykinetic, worsening masked face, bradykinesia, modest shuffling gait, postural instability, this may be Parkinson's disease. Brain MRI on 1/23 unremarkable, no ODS. Began Sinemet on 1/23. - Levodopa-Carbidopa (Sinemet) 25-100 mg TID began 1/23, showing improvement - Brain MRI motion degraded by largely normal yesterday, no evidence of ODS [facility] consulted, appreciate recs, may require Seroquel or inpatient psych in future  Constipation, patient with IBS and alternating constipation or diarrhea, also poor po intake. Family report no BM in several days -- Added home Miralax and Senna PRN -- home Fibercon  Hypertension, labile with SBP 200+  intermittently, no 115/46, pressure has dropped with advent of Sinemet -- Reduce Irbesartan to 150 mg daily -- DC Amlodipine 10 mg  -- Reduce Metoprolol XL to 25 mg   -- Hold Imdur 30 mg this AM  Hyponatremia, patient with hyponatremia 2/2 "tea and toast" diet. Sodium normalized.  --NaCL 1g tabs QD --Ensure supplements BID between meals --Trend BMP  COPD, no symptoms, continue home Advair, Claritin, Flonase  T2DM, on minimal oral medications.  --Hold home Amaryl --SSI-S WC  Hypothyroidism, TSH 2.136 on 11/26/2016 - holding home levothyroxine 68mcg daily  CAD, patient with LHC on 11/16 showing stable coronary disease with 40-50% L main stenosis and 70-70% D1 stenosis. No active chest pain, shortness of breath different from baseline, EKG with possible PAC's without acute ischemic changes. -- Plavix 75mg  daily -- atorvastatin reduced dose to 20mg  daily  HLD -- atorvastatin 20mg  daily.  GERD -- protonix 40mg  daily  Dispo: Anticipated discharge in approximately 1-2 day(s).   LOS: 6 days   Asencion Partridge, MD 12/11/2016, 9:58 AM Pager: [phone]

## 2016-12-12 ENCOUNTER — Ambulatory Visit: Payer: Medicare Other

## 2016-12-12 DIAGNOSIS — E43 Unspecified severe protein-calorie malnutrition: Secondary | ICD-10-CM

## 2016-12-12 DIAGNOSIS — R410 Disorientation, unspecified: Secondary | ICD-10-CM

## 2016-12-12 DIAGNOSIS — J449 Chronic obstructive pulmonary disease, unspecified: Secondary | ICD-10-CM | POA: Diagnosis not present

## 2016-12-12 DIAGNOSIS — I2510 Atherosclerotic heart disease of native coronary artery without angina pectoris: Secondary | ICD-10-CM | POA: Diagnosis not present

## 2016-12-12 DIAGNOSIS — I10 Essential (primary) hypertension: Secondary | ICD-10-CM | POA: Diagnosis not present

## 2016-12-12 DIAGNOSIS — K219 Gastro-esophageal reflux disease without esophagitis: Secondary | ICD-10-CM | POA: Diagnosis not present

## 2016-12-12 DIAGNOSIS — E119 Type 2 diabetes mellitus without complications: Secondary | ICD-10-CM | POA: Diagnosis not present

## 2016-12-12 DIAGNOSIS — E785 Hyperlipidemia, unspecified: Secondary | ICD-10-CM | POA: Diagnosis not present

## 2016-12-12 DIAGNOSIS — E871 Hypo-osmolality and hyponatremia: Secondary | ICD-10-CM | POA: Diagnosis not present

## 2016-12-12 DIAGNOSIS — E039 Hypothyroidism, unspecified: Secondary | ICD-10-CM | POA: Diagnosis not present

## 2016-12-12 DIAGNOSIS — R4182 Altered mental status, unspecified: Secondary | ICD-10-CM | POA: Diagnosis not present

## 2016-12-12 DIAGNOSIS — R451 Restlessness and agitation: Secondary | ICD-10-CM | POA: Diagnosis not present

## 2016-12-12 DIAGNOSIS — R258 Other abnormal involuntary movements: Secondary | ICD-10-CM | POA: Diagnosis not present

## 2016-12-12 LAB — BASIC METABOLIC PANEL
ANION GAP: 12 (ref 5–15)
BUN: 33 mg/dL — ABNORMAL HIGH (ref 6–20)
CALCIUM: 10.2 mg/dL (ref 8.9–10.3)
CO2: 22 mmol/L (ref 22–32)
Chloride: 101 mmol/L (ref 101–111)
Creatinine, Ser: 1.12 mg/dL — ABNORMAL HIGH (ref 0.44–1.00)
GFR calc Af Amer: 52 mL/min — ABNORMAL LOW (ref >60)
GFR, EST NON AFRICAN AMERICAN: 45 mL/min — AB (ref >60)
GLUCOSE: 170 mg/dL — AB (ref 65–99)
Potassium: 4.2 mmol/L (ref 3.5–5.1)
Sodium: 135 mmol/L (ref 135–145)

## 2016-12-12 LAB — GLUCOSE, CAPILLARY
GLUCOSE-CAPILLARY: 177 mg/dL — AB (ref 65–99)
Glucose-Capillary: 203 mg/dL — ABNORMAL HIGH (ref 65–99)
Glucose-Capillary: 152 mg/dL — ABNORMAL HIGH (ref 65–99)
Glucose-Capillary: 138 mg/dL — ABNORMAL HIGH (ref 65–99)

## 2016-12-12 MED ORDER — HYDRALAZINE HCL 20 MG/ML IJ SOLN
10.0000 mg | Freq: Once | INTRAMUSCULAR | Status: AC
  Administered 2016-12-12: 10 mg via INTRAVENOUS
  Filled 2016-12-12: qty 1

## 2016-12-12 MED ORDER — CLONIDINE HCL 0.1 MG/24HR TD PTWK
0.1000 mg | MEDICATED_PATCH | TRANSDERMAL | Status: DC
  Administered 2016-12-12: 0.1 mg via TRANSDERMAL
  Filled 2016-12-12: qty 1

## 2016-12-12 MED ORDER — GLUCERNA SHAKE PO LIQD
237.0000 mL | Freq: Three times a day (TID) | ORAL | Status: DC
  Administered 2016-12-13: 237 mL via ORAL

## 2016-12-12 MED ORDER — SODIUM CHLORIDE 0.9 % IV SOLN
INTRAVENOUS | Status: AC
  Administered 2016-12-12: 12:00:00 via INTRAVENOUS

## 2016-12-12 NOTE — Progress Notes (Signed)
Subjective:  Christine [name] was unfortunately up all night, not very active, sitting or laying in bed. She is sleeping and appears nearly catatonic today. Very hypoactive and slow to respond but oriented to person, place, time. Has begun refusing to take her oral medications this morning. Does not seem to be responding to Sinemet. Family at bedside expressed intense frustration over Christine [name] decline over the past 2 weeks and attribute much of her current status due to medications like Haldol or Effexor that she received many days ago. We discussed this extensively with the family and have decided to consult neurology today to gain their perspective.   Objective: Vital signs in last 24 hours: Vitals:   12/11/16 2016 12/11/16 2027 12/12/16 0200 12/12/16 0642  BP:  (!) 197/78 (!) 189/104 (!) 166/55  Pulse:  (!) 102 (!) 102 (!) 110  Resp:  16 18 18   Temp:  98.7 F (37.1 C) 98.7 F (37.1 C) 98.3 F (36.8 C)  TempSrc:  Axillary Axillary Axillary  SpO2: 98% 100% 100% 99%  Weight:   113 lb 4.8 oz (51.4 kg)   Height:        Intake/Output Summary (Last 24 hours) at 12/12/16 0744 Last data filed at 12/12/16 0601  Gross per 24 hour  Intake              480 ml  Output                0 ml  Net              480 ml   Physical Exam General: Awake, expressionless, laying in bed, soft spoken and responds to simple questions Head: Normocephalic, atraumatic, moist MM Lungs: Clear to auscultation bilaterally, normal work of breathing Heart: RRR, systolic ejection murmur, no gallops or rubs.  Abdomen: Soft, non-tender, no distention, no guarding Extremities: Thin bulk and normal ROM, no cyanosis, clubbing, edema Neurologic: Awake and oriented x3, expressionless face, hypoactive, follows simple commands, some tremor present in arms bilaterally, grip and arm strength 3-4/5  Skin: Skin turgor decreased, warm, dry, and intact  Psych: Blunted affect  Labs / Imaging / Procedures: CBC Latest Ref Rng &  Units 12/10/2016 12/04/2016 12/03/2016  WBC 4.0 - 10.5 K/uL 8.9 10.2 6.2  Hemoglobin 12.0 - 15.0 g/dL 13.1 12.2 11.9(L)  Hematocrit 36.0 - 46.0 % 38.3 36.4 34.9(L)  Platelets 150 - 400 K/uL 432(H) 356 288   BMP Latest Ref Rng & Units 12/12/2016 12/11/2016 12/10/2016  Glucose 65 - 99 mg/dL 170(H) 127(H) 207(H)  BUN 6 - 20 mg/dL 33(H) 34(H) 13  Creatinine 0.44 - 1.00 mg/dL 1.12(H) 1.75(H) 0.86  BUN/Creat Ratio 12 - 28 - - -  Sodium 135 - 145 mmol/L 135 135 131(L)  Potassium 3.5 - 5.1 mmol/L 4.2 4.3 4.0  Chloride 101 - 111 mmol/L 101 101 97(L)  CO2 22 - 32 mmol/L 22 24 24   Calcium 8.9 - 10.3 mg/dL 10.2 9.9 10.2   No results found. Assessment/Plan:  Altered mental status and bradykinesia ,unresponsive to Sinemet, Leading diagnosis now TCA discontinuation syndrome Patient with new episodes of agitation in setting of medication changes, multiple hospitalizations and acute grief. Patient was recently taken off of Imipramine in setting of hyponatremia and started on venlafaxine and atarax for her depression and anxiety. Atarax was recently discontinued by [facility] in ED day prior to admission. Continues to have insomnia. Had severe EPS/dystonic reaction to Haldol on 1/19. Restarted Imipramine which did not improve her nighttime  agitation symptoms. Stopped all psych meds on 1/22 and patient remains bradykinetic, concern that she may have Parkinson's disease. Brain MRI on 1/23 unremarkable, no ODS. Began Sinemet on 1/23 with questionable initial response, now continues to be bradykinetic and refusing food and medicines.  - Dc Sinemet - [facility] consulted and appreciate recs, will discuss TCA discontinuation syndrome tomorrow  Severe malnutrition, now with minimal po intake and dehydration, 6.6% weight loss in 1 month - NS 75 cc/hr for 12 hours - Dietician rec Glucerna shake TID and encourage po intake  Constipation, patient with IBS and alternating constipation or diarrhea, also poor po intake. Family report no  BM in several days -- home Miralax and Senna PRN -- home Fibercon  Hypertension, labile with SBP 200+ intermittently, pressure dropped with Sinemet, now high again as patient refusing po medications -- Clonidine 0.1 mg patch 7d -- Irbesartan to 150 mg daily -- Metoprolol XL to 25 mg   -- Imdur 30 mg this AM  Hyponatremia, patient with hyponatremia 2/2 "tea and toast" diet. Sodium normalized.  --NaCL 1g tabs QD --Ensure supplements BID between meals --Trend BMP  COPD, no symptoms, continue home Advair, Claritin, Flonase  T2DM, on minimal oral medications.  --Hold home Amaryl --SSI-S WC  Hypothyroidism, TSH 2.136 on 11/26/2016 - holding home levothyroxine 31mcg daily  CAD, patient with LHC on 11/16 showing stable coronary disease with 40-50% L main stenosis and 70-70% D1 stenosis. No active chest pain, shortness of breath different from baseline, EKG with possible PAC's without acute ischemic changes. -- Plavix 75mg  daily -- atorvastatin reduced dose to 20mg  daily  HLD -- atorvastatin 20mg  daily.  GERD -- protonix 40mg  daily  Dispo: Anticipated discharge in approximately 1-2 day(s).   LOS: 7 days   Christine Partridge, MD 12/12/2016, 7:44 AM Pager: [phone]

## 2016-12-12 NOTE — Care Management Important Message (Signed)
Important Message  Patient Details  Name: Christine [name] MRN: [id number] Date of Birth: 01/24/1934   Medicare Important Message Given:  Yes    Trevin Gartrell [name] 12/12/2016, 11:40 AM

## 2016-12-12 NOTE — Progress Notes (Signed)
Initial Nutrition Assessment  DOCUMENTATION CODES:   Severe malnutrition in context of chronic illness  INTERVENTION:  Provide Glucerna Shake po TID, each supplement provides 220 kcal and 10 grams of protein.  Encourage adequate PO intake.  NUTRITION DIAGNOSIS:   Malnutrition related to chronic illness as evidenced by percent weight loss, severe depletion of body fat, severe depletion of muscle mass.  GOAL:   Patient will meet greater than or equal to 90% of their needs  MONITOR:   PO intake, Supplement acceptance, Labs, Weight trends, Skin, I & O's  REASON FOR ASSESSMENT:    (Nutritional supplement need)    ASSESSMENT:   83yo female with PMH of HTN, HLD, hypothyroidism, CAD, T2DM, COPD, CKD presenting to [facility] for nausea, vomiting, diarrhea and agitated behavior.  Per RN, pt is less interactive today when compared to yesterday. During time of visit, pt did not respond to questions asked. Family at bedside report pt was eating fine at home PTA with usual consumption of at least 3 meals a day. Per RN, pt has been refusing some po today. Per weight records, pt with a 6.6% weight loss in 1 month. Pt currently has Glucerna shake ordered. RD to increase orders to TID to aid in adequate nutrition. Family at bedside has been encouraging pt po intake.   Nutrition-Focused physical exam completed. Findings are severe fat depletion, severe muscle depletion, and no edema.   Labs and medications reviewed.   Diet Order:  Diet Carb Modified Fluid consistency: Thin; Room service appropriate? Yes; Fluid restriction: 1200 mL Fluid  Skin:  Reviewed, no issues  Last BM:  1/24  Height:   Ht Readings from Last 1 Encounters:  12/06/16 5\' 4"  (1.626 m)    Weight:   Wt Readings from Last 1 Encounters:  12/12/16 113 lb 4.8 oz (51.4 kg)    Ideal Body Weight:  54.5 kg  BMI:  Body mass index is 19.45 kg/m.  Estimated Nutritional Needs:   Kcal:  1500-1700  Protein:  65-75  grams  Fluid:  1.2 L/day  EDUCATION NEEDS:   No education needs identified at this time  Corrin Parker, MS, RD, LDN Pager # [phone] After hours/ weekend pager # [phone]

## 2016-12-12 NOTE — Progress Notes (Signed)
Pt B/p-189-104, Pulse-102. Page on call MD K.Government social research officer.

## 2016-12-12 NOTE — Progress Notes (Signed)
Patient has refused 10 o'clock meds, multiple attempts were made. MD paged.

## 2016-12-12 NOTE — Progress Notes (Signed)
  Date: 12/12/2016  Patient name: Christine [name]  Medical record number: [id number]  Date of birth: 01/24/1934   I have seen and evaluated this patient and I have discussed the plan of care with the house staff. Please see Dr. Durenda Age note for complete details. I concur with his findings.   Ms. Koelker does display some of the findings of TCA discontinuation syndrome.  She was on imipramine for > 30 years which may have precipitated the withdrawal.  I will attempt to discuss with another psychiatrist tomorrow who may have more experience to assess whether we should do another trial of the TCA tomorrow.   [name], MD 12/12/2016, 7:51 PM

## 2016-12-12 NOTE — Progress Notes (Signed)
Spoke to RN and patients no stable at current time with BP. Spoke to Dr. Shon Hale neurologist and he stated this could wait till stable and it definitely was not a STAT. EEG will be completed 12-13-16 as schedule permits.

## 2016-12-12 NOTE — Consult Note (Signed)
NEURO HOSPITALIST CONSULT NOTE   Requestig physician: Dr. Tyrone Apple   Reason for Consult: Altered mental status   History obtained from:  Sister and chart  HPI:                                                                                                                                          Christine [name] is an 83 y.o. female who is well known to the [facility] health internal medicine residency program. Per their note "She has presented twice in the past month with mental status issues, decreased PO intake and confusion.  She also at one point had dysarthria and workup for stroke was negative.  This time, she began acting confused after taking her evening medications and developing diarrhea.  She also became combative.  She had an episode of giddiness per her family and has not been sleeping well.  She has had a recent death in the family and has been expressing a lot of guilt and agitation.  Her family reports that it seems her personality has changed and that she has not been sleeping well.  At last admission, she was taken off her imipramine and started on venlafaxine.  The family is concerned about withdrawal from the [place] as she has been on it for > 30 years.  Her son also notes that she was most like herself when she had not taken her medications. Patient was admitted with differential diagnosing including medication effects versus hypertensive encephalopathy. She has had 3 head CTs in the past month and MRI brain without contrast with no acute pathology. She has also been treated in the past month for hyponatremia during her first admission and improved at that time. On the night of admission patient became agitated and required Haldol. After the Haldol overnight again she had episodes of agitation that consisted of her using the restroom and then returning to bed and verbalizing unhappy thoughts in a tangential and circular pattern. Patient then became combative and upset. On the  morning of awakening she was rigid mute twitching rhythmically and bed and there was concern for EPS. Family at that time refused Cogentin to counteract the effects of Haldol related to parkinsonism. Throughout the day per nurse patient was repeating herself over and over saying" please let me go" in attempting to get up but was not combative. On one 20th 2018 patient was noted to have 2 personality change with concerns about the double etc. Apparently she was not sleeping throughout the night however the daughter refused to give her Klonopin to allow her to sleep. Throughout hospital stay patient would wax and wane from agitation to non-agitation. Again with secular thoughts about God, Devil, ongoing home.   On 12/10/2016 there is a concern that she may have  Parkinson's secondary to her tremor rigidity and cogwheeling. It was discussed with the family and low dose Sinemet was added. At this time it does not seem like there is been any increase in response after she has been on Sinemet.   Daughter is very concerned that the Sinemet should be making a cognitive change. I discussed in length with the daughter that the Sinemet would most likely not make any cognitive change but would help with the stiffness. I also explained that the Haldol is a dopamine antagonist and if patient does have Parkinson's eye giving her Haldol this could have increased her rigidity. .  Currently upon consultation patient is laying in her bed she is showing no expression, lying flat in bed not moving staring at the ceiling. She is flaccid throughout making no attempts to move. She does blink to threat. She does not respond to any communication. She looks as though she is in a severe hypoactive delirium. In addition has been stated to me that she has recently had 5 members of her family that have died recently.  Currently she is only on Lipitor, Sinemet 25-100 milligrams 4 times daily, Plavix, feeding supplement, insulin, Avapro,  Imdur   Past Medical History:  Diagnosis Date  . Anemia   . Anginal pain ([place])   . Arthritis    "back, arms, hips; hands" (11/30/2015)  . Benign hypertensive heart and kidney disease with diastolic CHF, NYHA class II and CKD stage III ([place])   . Blood transfusion 1972   "after daughter born, attempted to give me blood; couldn't give it cause my blood was cold" (09/23/2013)  . CAD (coronary artery disease)    a. LHC 08/23/11: dLM 40-50%, oRI 40%, oCFX 40%, oD1 70% (small and not amenable to PCI).  LM lesion did not appear to be flow limiting.  Medical rx was recommended;  b. Echo 08/23/11: mild LVH, EF 60-65%, grade 1 diast dysfxn, mild BAE, PASP 24;  c. 02/2012  Cath: LM 50d, LAD 50p, D1 70ost, RI 70p, RCA ok->Med Rx;  d. 01/2013 Cardiolite: EF 88, no ischemia/infarct.  . Carotid stenosis    a. dopplers 10/12:  0-39% bilat ICA;  b. 10/2012 U/S: 0-39% bilat, f/u 1 yr (10/2013).  . Chronic bronchitis   . Depression   . Diverticulosis of colon (without mention of hemorrhage)   . GERD (gastroesophageal reflux disease)   . Heart murmur, systolic    2-D echo in December 2011 showed a normal EF with grade 1 diastolic dysfunction, trivial pulmonary regurgitation and mildly elevated PA pressure at 37 mmHg probably secondary to her COPD.dynamic obstruction-mid cavity obliteration;  b. 02/2012 Echo: EF 60-65%, mild LVH, PASP 34mmHg.  Marland Kitchen History of stomach ulcers 1970's  . Hyperlipidemia   . Hypertension   . Hypothyroidism   . Migraines    a. Next atypical symptoms in the past. Patient was started on Neurontin for possible neuropathic origin of her pain.  . Polymyalgia rheumatica ([place])   . Shortness of breath on exertion    "just related to angina >1 yr ago" (09/23/2013)  . Sinus arrhythmia   . Type II diabetes mellitus ([place])    a. On oral hypoglycemic agents.    Past Surgical History:  Procedure Laterality Date  . CARDIAC CATHETERIZATION N/A 09/26/2015   Procedure: Right/Left Heart Cath and Coronary  Angiography;  Surgeon: Larey Dresser, MD;  Location: [facility];  Service: Cardiovascular;  Laterality: N/A;  . CATARACT EXTRACTION W/ INTRAOCULAR LENS  IMPLANT, BILATERAL Bilateral 1990's  . LEFT HEART CATHETERIZATION WITH CORONARY ANGIOGRAM N/A 03/16/2012   Procedure: LEFT HEART CATHETERIZATION WITH CORONARY ANGIOGRAM;  Surgeon: Hillary Bow, MD;  Location: [facility] CATH LAB;  Service: Cardiovascular;  Laterality: N/A;  . VAGINAL HYSTERECTOMY  1976    Family History  Problem Relation Age of Onset  . Colon cancer Maternal Grandmother   . Heart attack Maternal Grandmother   . COPD Father   . Other Mother     died of unknown causes in her 70's. Pt raised by grandmother.  . Pulmonary Hypertension Daughter       Social History:  reports that she has never smoked. She has never used smokeless tobacco. She reports that she does not drink alcohol or use drugs.  Allergies  Allergen Reactions  . Aspirin Swelling and Other (See Comments)    Angioedema  . Nsaids Other (See Comments)    "interacts with heart medications"  . Codeine Nausea And Vomiting and Other (See Comments)    "makes me deathly sick" - can take with nausea medicine   . Haldol [Haloperidol] Other (See Comments)    EPS - rigidity and dystonic reaction, very sensitive  . Other Other (See Comments)    MSG  . Penicillins Diarrhea    "real bad" Has patient had a PCN reaction causing immediate rash, facial/tongue/throat swelling, SOB or lightheadedness with hypotension: Yes Has patient had a PCN reaction causing severe rash involving mucus membranes or skin necrosis: No Has patient had a PCN reaction that required hospitalization No Has patient had a PCN reaction occurring within the last 10 years: Yes If all of the above answers are "NO", then may proceed with Cephalosporin use.     MEDICATIONS:                                                                                                                      Scheduled: . atorvastatin  20 mg Oral QPM  . carbidopa-levodopa  1 tablet Oral QID  . clopidogrel  75 mg Oral QPM  . enoxaparin (LOVENOX) injection  30 mg Subcutaneous Q24H  . feeding supplement (GLUCERNA SHAKE)  237 mL Oral BID BM  . hydroxypropyl methylcellulose / hypromellose  1 drop Both Eyes QHS  . insulin aspart  0-9 Units Subcutaneous TID WC  . irbesartan  150 mg Oral Q1400  . isosorbide mononitrate  30 mg Oral Daily  . metoprolol succinate  25 mg Oral Daily  . mometasone-formoterol  2 puff Inhalation BID  . multivitamin with minerals  1 tablet Oral Daily  . pantoprazole  40 mg Oral Daily  . polycarbophil  625 mg Oral Daily  . sodium chloride  1 g Oral Daily  . vitamin C  250 mg Oral Daily     ROS:  History obtained from unobtainable from patient due to Patient is mute     Blood pressure (!) 149/64, pulse (!) 108, temperature 98.5 F (36.9 C), temperature source Oral, resp. rate 18, height 5\' 4"  (1.626 m), weight 51.4 kg (113 lb 4.8 oz), last menstrual period 02/11/1975, SpO2 98 %.   Neurologic Examination:                                                                                                      HEENT-  Normocephalic, no lesions, without obvious abnormality.  Normal external eye and conjunctiva.  Normal TM's bilaterally.  Normal auditory canals and external ears. Normal external nose, mucus membranes and septum.  Normal pharynx. Cardiovascular- S1, S2 normal, pulses palpable throughout   Lungs- tachypnea is noted Abdomen- normal findings: bowel sounds normal Extremities- no edema Lymph-no adenopathy palpable Musculoskeletal-no joint tenderness, deformity or swelling Skin-warm and dry, no hyperpigmentation, vitiligo, or suspicious lesions  Neurological Examination Mental Status: Laying in bed with eyes wide open staring at  the ceiling. Patient does not track me in the room. Patient blinks to threat but makes no motion to localize to pain. Sternal rub or turn her head while moving. She does not make any motion or response to verbal stimuli. Face is flat with no motion. She almost looks as if she is in a catatonic state.. Cranial Nerves: II: Blinks to threat bilaterally with pupils 2 mm bilaterally and reactive III,IV, VI: ptosis not present, extra-ocular motions intact bilaterally V,VII: Face symmetric, facial light touch sensation normal bilaterally VIII: hearing normal bilaterally n Motor: Patient is flaccid throughout. I do note cogwheeling rigidity in bilateral wrists along with increased tone when lifting legs up. Sensory: No response to noxious stimuli in all 4 extremities Deep Tendon Reflexes: 1+ and symmetric throughout Plantars: Right: downgoing   Left: downgoing Cerebellar: Patient would not take part in exam Gait: Not tested      Lab Results: Basic Metabolic Panel:  Recent Labs Lab 12/08/16 0438 12/09/16 1053 12/10/16 0536 12/11/16 0657 12/12/16 0449  NA 133* 134* 131* 135 135  K 3.7 4.0 4.0 4.3 4.2  CL 99* 99* 97* 101 101  CO2 24 25 24 24 22   GLUCOSE 174* 213* 207* 127* 170*  BUN 10 15 13  34* 33*  CREATININE 0.73 0.99 0.86 1.75* 1.12*  CALCIUM 9.8 9.9 10.2 9.9 10.2    Liver Function Tests: No results for input(s): AST, ALT, ALKPHOS, BILITOT, PROT, ALBUMIN in the last 168 hours. No results for input(s): LIPASE, AMYLASE in the last 168 hours. No results for input(s): AMMONIA in the last 168 hours.  CBC:  Recent Labs Lab 12/10/16 0536  WBC 8.9  HGB 13.1  HCT 38.3  MCV 87.6  PLT 432*    Cardiac Enzymes: No results for input(s): CKTOTAL, CKMB, CKMBINDEX, TROPONINI in the last 168 hours.  Lipid Panel: No results for input(s): CHOL, TRIG, HDL, CHOLHDL, VLDL, LDLCALC in the last 168 hours.  CBG:  Recent Labs Lab 12/11/16 1223 12/11/16 1707 12/11/16 2025  12/12/16 0756 12/12/16 1154  GLUCAP 320* 110*  147* 53* 152*    Microbiology: Results for orders placed or performed during the hospital encounter of 12/04/16  C difficile quick scan w PCR reflex     Status: None   Collection Time: 12/04/16 11:11 PM  Result Value Ref Range Status   C Diff antigen NEGATIVE NEGATIVE Final   C Diff toxin NEGATIVE NEGATIVE Final   C Diff interpretation No C. difficile detected.  Final    Coagulation Studies: No results for input(s): LABPROT, INR in the last 72 hours.  Imaging: Mr Brain Wo Contrast  Result Date: 12/10/2016 CLINICAL DATA:  Suspected Parkinson's, rigid posture with worsening bradykinesis. History of hypertension, polymyalgia rheumatica, diabetes. EXAM: MRI HEAD WITHOUT CONTRAST TECHNIQUE: Multiplanar, multiecho pulse sequences of the brain and surrounding structures were obtained without intravenous contrast. Due to patient motion, contrast not administered. COMPARISON:  MRI head November 30, 2016 and CT HEAD December 05, 2016 FINDINGS: Moderate to severely motion degraded examination. Fast sequences E performed. BRAIN: No reduced diffusion to suggest acute ischemia. No susceptibility artifact to suggest hemorrhage. The ventricles and sulci are normal for patient's age. Patchy supratentorial white matter FLAIR T2 hyperintensities compatible with mild chronic small vessel ischemic disease, less than expected for age. No suspicious parenchymal signal, masses or mass effect. No abnormal extra-axial fluid collections. No extra-axial masses though, contrast enhanced sequences would be more sensitive. VASCULAR: Normal major intracranial vascular flow voids present at skull base. SKULL AND UPPER CERVICAL SPINE: No abnormal sellar expansion. No suspicious calvarial bone marrow signal. Craniocervical junction maintained. SINUSES/ORBITS: Trace RIGHT posterior ethmoid mucosal thickening. Mastoid air cells are well aerated. The included ocular globes and orbital  contents are non-suspicious, status post bilateral ocular lens implants. OTHER: None. IMPRESSION: Moderate to severely motion degraded examination: Stable negative noncontrast MRI head for age. Electronically Signed   By: Elon Alas M.D.   On: 12/10/2016 17:54       Assessment and plan per attending neurologist  Etta Quill PA-C [facility] [phone]  12/12/2016, 1:29 PM   Assessment/Plan:  This is an 83 year old female with significant past medical history of hyponatremia along with recent loss of significant others. Patient was brought to the hospital after having significant agitation and altered mental status. While in the hospital patient has been placed on Haldol which caused rigidity and recently placed on Sinemet. Sinemet does not seem to have made any change. Currently on exam patient looks almost catatonic as she is making no movement staring at the ceiling following no commands and is nonvocal. Multiple MRIs and CTs of her brain have revealed no etiology.  Recommend: -Psychiatry to come back and reevaluate patient -Although I believe this will be low yield will also obtain EEG

## 2016-12-12 NOTE — Progress Notes (Signed)
12/12/2016 10:21 AM  Patient daughter Annamary Carolin requested patient entire medication list from attending RN Urban Gibson. Rn informed this Probation officer about request.   Spoke to patient and family regarding care concerns and assisted in service recovery.  Provided [name] with "Request for medical information" form. Answered any questions that [place] had about the form. [name] returned form to this Probation officer, with patient signature on the form.   [name] inquired about HPOA. Consulted SW Dominica who stated since patient is not oriented with AMS, HPOA forms cannot be completed at this time. Updated [name] with this information.   Will continue to follow.   [employer], RN-BC [employer] Phone [phone]

## 2016-12-13 ENCOUNTER — Ambulatory Visit (HOSPITAL_COMMUNITY): Payer: Medicare Other

## 2016-12-13 ENCOUNTER — Telehealth: Payer: Self-pay

## 2016-12-13 DIAGNOSIS — I160 Hypertensive urgency: Secondary | ICD-10-CM

## 2016-12-13 DIAGNOSIS — E1122 Type 2 diabetes mellitus with diabetic chronic kidney disease: Secondary | ICD-10-CM

## 2016-12-13 DIAGNOSIS — T43015A Adverse effect of tricyclic antidepressants, initial encounter: Secondary | ICD-10-CM

## 2016-12-13 DIAGNOSIS — N179 Acute kidney failure, unspecified: Secondary | ICD-10-CM

## 2016-12-13 DIAGNOSIS — N183 Chronic kidney disease, stage 3 (moderate): Secondary | ICD-10-CM

## 2016-12-13 DIAGNOSIS — I129 Hypertensive chronic kidney disease with stage 1 through stage 4 chronic kidney disease, or unspecified chronic kidney disease: Secondary | ICD-10-CM

## 2016-12-13 DIAGNOSIS — I2510 Atherosclerotic heart disease of native coronary artery without angina pectoris: Secondary | ICD-10-CM | POA: Diagnosis not present

## 2016-12-13 DIAGNOSIS — E039 Hypothyroidism, unspecified: Secondary | ICD-10-CM | POA: Diagnosis not present

## 2016-12-13 DIAGNOSIS — R410 Disorientation, unspecified: Secondary | ICD-10-CM | POA: Diagnosis not present

## 2016-12-13 DIAGNOSIS — E871 Hypo-osmolality and hyponatremia: Secondary | ICD-10-CM | POA: Diagnosis not present

## 2016-12-13 DIAGNOSIS — R258 Other abnormal involuntary movements: Secondary | ICD-10-CM | POA: Diagnosis not present

## 2016-12-13 DIAGNOSIS — E43 Unspecified severe protein-calorie malnutrition: Secondary | ICD-10-CM | POA: Diagnosis not present

## 2016-12-13 LAB — BASIC METABOLIC PANEL
Anion gap: 9 (ref 5–15)
BUN: 29 mg/dL — ABNORMAL HIGH (ref 6–20)
CALCIUM: 9.9 mg/dL (ref 8.9–10.3)
CO2: 23 mmol/L (ref 22–32)
Chloride: 108 mmol/L (ref 101–111)
Creatinine, Ser: 1.07 mg/dL — ABNORMAL HIGH (ref 0.44–1.00)
GFR, EST AFRICAN AMERICAN: 55 mL/min — AB (ref >60)
GFR, EST NON AFRICAN AMERICAN: 48 mL/min — AB (ref >60)
GLUCOSE: 103 mg/dL — AB (ref 65–99)
Potassium: 3.9 mmol/L (ref 3.5–5.1)
SODIUM: 140 mmol/L (ref 135–145)

## 2016-12-13 LAB — GLUCOSE, CAPILLARY
GLUCOSE-CAPILLARY: 141 mg/dL — AB (ref 65–99)
GLUCOSE-CAPILLARY: 174 mg/dL — AB (ref 65–99)
GLUCOSE-CAPILLARY: 131 mg/dL — AB (ref 65–99)

## 2016-12-13 MED ORDER — CLONIDINE HCL 0.1 MG/24HR TD PTWK: 0.1000 mg | MEDICATED_PATCH | TRANSDERMAL | 12 refills | Status: DC

## 2016-12-13 MED ORDER — GLUCERNA SHAKE PO LIQD: 237.0000 mL | Freq: Three times a day (TID) | ORAL | 11 refills | Status: DC

## 2016-12-13 MED ORDER — SODIUM CHLORIDE 0.9 % IV SOLN
INTRAVENOUS | Status: AC
  Administered 2016-12-13: 09:00:00 via INTRAVENOUS

## 2016-12-13 MED ORDER — IMIPRAMINE HCL 25 MG PO TABS: 25.0000 mg | ORAL_TABLET | Freq: Two times a day (BID) | ORAL | 2 refills | Status: DC

## 2016-12-13 MED ORDER — SODIUM CHLORIDE 1 G PO TABS: 1.0000 g | ORAL_TABLET | Freq: Every day | ORAL | 0 refills | Status: DC

## 2016-12-13 MED ORDER — METOPROLOL SUCCINATE ER 25 MG PO TB24: 25.0000 mg | ORAL_TABLET | Freq: Every day | ORAL | 1 refills | Status: DC

## 2016-12-13 MED ORDER — ATORVASTATIN CALCIUM 40 MG PO TABS: 20.0000 mg | ORAL_TABLET | Freq: Every evening | ORAL | 1 refills | Status: DC

## 2016-12-13 MED ORDER — VALSARTAN 160 MG PO TABS: 160.0000 mg | ORAL_TABLET | Freq: Every day | ORAL | 0 refills | Status: DC

## 2016-12-13 NOTE — Care Management Note (Addendum)
Case Management Note  Patient Details  Name: MONTEZ STRYKER MRN: [id number] Date of Birth: 01/24/1934  Subjective/Objective:         CM following for progression and d/c planning.           Action/Plan: 12/13/2016 Met with pt and pt daughter, ordered 3:1 for home use. Daughter concerned about cost of Clonidine Patch, per [place] Medicaid list this medication is a preferred drug and cost is < $4 per month. Pt is active with [facility], and we discussed [place] services with [facility].  Await daughters decision.  4pm After discussion with daughter and with [facility], the daughter has decided to continue [facility] services with [facility]. This agency has offered their "Radar Program" which will provide the pt with 30 days of home visits or followup by phone daily, then ongoing care as needed.  [place] notified of pt decision.   Expected Discharge Date:  12/09/16               Expected Discharge Plan:  [place]  In-House Referral:  NA  Discharge planning Services  CM Consult  Post Acute Care Choice:  Durable Medical Equipment, Home Health Choice offered to:  Adult Children  DME Arranged:  3-N-1 DME Agency:  [place] Arranged:  RN, PT [facility] Agency:     Status of Service:  In process, will continue to follow  If discussed at Long Length of Stay Meetings, dates discussed:    Additional Comments:  Adron Bene, RN 12/13/2016, 2:36 PM

## 2016-12-13 NOTE — Discharge Summary (Signed)
Name: Christine [name] MRN: [id number] DOB: 01/24/1934 83 y.o. PCP: [name], MD  Date of Admission: 12/04/2016  8:11 PM Date of Discharge: 12/13/2016 Attending Physician: [name], MD  Discharge Diagnosis: 1. TCA discontinuation syndrome 2. Hypertensive urgency 3. Severe malnutrition 4. Hyponatremia 5. AKI  Principal Problem:   Delirium Active Problems:   Hypothyroidism   Type 2 diabetes mellitus, controlled ([place])   Essential hypertension   Coronary artery disease   Chronic renal insufficiency, stage III (moderate)   Malnutrition of moderate degree   Bradykinesia   AKI (acute kidney injury) [facility])   Discharge Medications: Allergies as of 12/13/2016      Reactions   Aspirin Swelling, Other (See Comments)   Angioedema   Nsaids Other (See Comments)   "interacts with heart medications"   Codeine Nausea And Vomiting, Other (See Comments)   "makes me deathly sick" - can take with nausea medicine    Haldol [haloperidol] Other (See Comments)   EPS - rigidity and dystonic reaction, very sensitive   Other Other (See Comments)   MSG   Penicillins Diarrhea   "real bad" Has patient had a PCN reaction causing immediate rash, facial/tongue/throat swelling, SOB or lightheadedness with hypotension: Yes Has patient had a PCN reaction causing severe rash involving mucus membranes or skin necrosis: No Has patient had a PCN reaction that required hospitalization No Has patient had a PCN reaction occurring within the last 10 years: Yes If all of the above answers are "NO", then may proceed with Cephalosporin use.      Medication List    STOP taking these medications   amLODipine 10 MG tablet Commonly known as:  NORVASC   guaiFENesin-dextromethorphan 100-10 MG/5ML syrup Commonly known as:  ROBITUSSIN DM   isosorbide mononitrate 30 MG 24 hr tablet Commonly known as:  IMDUR   LORazepam 0.5 MG tablet Commonly known as:  ATIVAN   ondansetron 4 MG tablet Commonly known  as:  ZOFRAN   venlafaxine XR 75 MG 24 hr capsule Commonly known as:  EFFEXOR-XR     TAKE these medications   acetaminophen 500 MG tablet Commonly known as:  TYLENOL Take 1,000 mg by mouth every 6 (six) hours as needed for headache. For pain   ADVAIR DISKUS 250-50 MCG/DOSE Aepb Generic drug:  Fluticasone-Salmeterol inhale 1 dose by mouth every 12 hours   atorvastatin 40 MG tablet Commonly known as:  LIPITOR Take 0.5 tablets (20 mg total) by mouth every evening. What changed:  how much to take   cloNIDine 0.1 mg/24hr patch Commonly known as:  CATAPRES - Dosed in mg/24 hr Place 1 patch (0.1 mg total) onto the skin once a week.   clopidogrel 75 MG tablet Commonly known as:  PLAVIX take 1 tablet by mouth every evening   esomeprazole 40 MG capsule Commonly known as:  NEXIUM take 1 capsule by mouth once daily BEFORE BREAKFAST   feeding supplement (GLUCERNA SHAKE) Liqd Take 237 mLs by mouth 3 (three) times daily between meals. What changed:  when to take this   fluticasone 50 MCG/ACT nasal spray Commonly known as:  FLONASE Place 1 spray into both nostrils daily. What changed:  when to take this  reasons to take this   glimepiride 1 MG tablet Commonly known as:  AMARYL take 1 tablet by mouth once daily BEFORE BREAKFAST   hydroxypropyl methylcellulose / hypromellose 2.5 % ophthalmic solution Commonly known as:  ISOPTO TEARS / GONIOVISC Place 1 drop into both eyes at  bedtime.   imipramine 25 MG tablet Commonly known as:  TOFRANIL Take 1 tablet (25 mg total) by mouth 2 (two) times daily.   levothyroxine 25 MCG tablet Commonly known as:  SYNTHROID, LEVOTHROID take 1 tablet by mouth once daily   loratadine 10 MG tablet Commonly known as:  CLARITIN Take 10 mg by mouth daily as needed for allergies, rhinitis or itching.   metoprolol succinate 25 MG 24 hr tablet Commonly known as:  TOPROL XL Take 1 tablet (25 mg total) by mouth daily. What changed:  how much to  take   multivitamin with minerals Tabs tablet Take 1 tablet by mouth daily.   NITROSTAT 0.4 MG SL tablet Generic drug:  nitroGLYCERIN place 1 tablet under the tongue if needed every 5 minutes for chest pain for 3 doses IF NO RELIEF AFTER 3RD DOSE CALL PRESCRIBER OR 911.   polycarbophil 625 MG tablet Commonly known as:  FIBERCON Take 1 tablet (625 mg total) by mouth daily.   polyethylene glycol packet Commonly known as:  MIRALAX / GLYCOLAX Take 17 g by mouth daily as needed.   senna 8.6 MG Tabs tablet Commonly known as:  SENOKOT Take 1 tablet (8.6 mg total) by mouth at bedtime as needed for mild constipation.   sodium chloride 1 g tablet Take 1 tablet (1 g total) by mouth daily. What changed:  when to take this   valsartan 160 MG tablet Commonly known as:  DIOVAN Take 1 tablet (160 mg total) by mouth daily. What changed:  how much to take   VITAMIN C PO Take 1 tablet by mouth daily.       Disposition and follow-up:   Christine [name] was discharged from [facility] in Stable condition.  At the hospital follow up visit please address:  1. TCA discontinuation syndrome - assess for resolution or persistence of bradykinetic / withdrawn behavior, hypoactive delirium, with resuming Imipramine  Hypertensive urgency - assess blood pressure control, adherence with clonidine patch, Valsartan, Metoprolol succinate. Assess need to restart Imdur  Severe malnutrition - poor appetite and po intake without max assistance, assess food and supplement intake, weight  Hyponatremia - assess sodium level and free water intake  AKI - assess renal function  2.  Labs / imaging needed at time of follow-up: BMP  3.  Pending labs/ test needing follow-up: None  Follow-up Appointments: Follow-up Information    Haskel Schroeder, MD. Schedule an appointment as soon as possible for a visit.   Specialty:  Psychiatry Contact information: [place] [id number] [address]. Go on 12/19/2016.   Why:  At 10:45 AM. Hospital follow up visit, Dr. Daryll Drown is in clinic. Contact information: [address] [place] [facility] Course by problem list: Principal Problem:   Delirium Active Problems:   Hypothyroidism   Type 2 diabetes mellitus, controlled ([place])   Essential hypertension   Coronary artery disease   Chronic renal insufficiency, stage III (moderate)   Malnutrition of moderate degree   Bradykinesia   AKI (acute kidney injury) ([place])   1. TCA discontinuation syndrome  Christine [name] is an 83yo female with PMH of HTN, HLD, hypothyroidism, CAD, T2DM, COPD, CKD, depression who presented on 1/17 with nausea, vomiting, diarrhea, and agitated behavior which required Haldol in the ED. Her Imipramine, a medication she had taken  for 30 years, had been recently discontinued at a recent hospitalization and replaced with Atarax and Venlafaxine, which seemed to result in nightly episodes of agitation requiring Ativan to help her sleep. This admission her family remained justifiably concerned that her Imipramine had been discontinued too quickly and so it was resumed at a low dose (25 mg BID) starting 1/18. She had an episode of agitation and delirium overnight 1/18-1/19 and received IV Haldol, which unfortunately resulted in significant extrapyramidal side effects on morning of 1/19 with rigidity, tremor, and mutism. These symptoms improved but the delirium persisted. Her nightly episodes of delirium and withdrawn behavior persisted despite the Imipramine so this was again stopped on 1/22. Over the ensuing days she developed increased insomnia, dysphoria, bradykinesia, rigidity, minimal verbal output, and poor po intake. Psychiatry and neurology were consulted for assistance. On 1/23, there is some suspicion that she may have some parkinsonism with tremor, rigidity, and  cogwheeling. A low-dose of Carbidopa-Levodopa (Sinemet) was initiated. Brain MRI showed no abnormality on 1/23. It seemed like she had some improvement in her mental status on Sinemet when it was started but this was not sustained. Her motor abnormalities improved but she remained withdrawn, taking nothing po, and Sinemet was stopped on 12/12/16. She received IV fluids for two days while she refused to eat. Further review of her clinical history, literature, and her symptoms seemed again most consistent with TCA discontinuation syndrome. Her affect, bradykinesia, and po intake began to improve on 1/26 and she was deemed stable for discharge home with close family supervision, home health physical therapy, with plan to again resume Imipramine 25 mg BID.  Hypertensive urgency - labile blood pressure with intermittent SBPs in 200s+ shortly after presentation, resumed home Amlodipine 10 mg, Metoprolol XL 50 mg, Imdur 30 mg, and valsartan-equivalent (Irbesartan 300 mg) daily with adequate control. Often measured after/during episodes of agitation and found to be elevated, although Christine [name] also would have tense arms during such episodes. She began to withdraw and refuse food and medications on 1/23. Her blood pressure significantly improved on 1/23-1/24 during her trial of Sinemet and her Amlodipine was discontinued and her ARB, BB doses were halved. She continued to refuse medications so she was started on a Clonidine 0.1 mg patch on 1/25, which was continued on discharge.   Severe malnutrition - pre-existing gradual weight loss over previous months due to poor po intake at home. She was evaluated by a dietician during hospital stay, who noted 6+% weight loss in the last month alone, deemed to have severe fat and muscle depletion. Prescribed Glucerna shake TID to supplement caloric and protein intake.   Hyponatremia - presented with Na 132, extensive workup from previous hospitalization and clinical history most  consistent with "tea and toast" diet etiology (i.e. high free water and low solute intake). She continued sodium 1g tablets BID until 1/22 and then reduced them to once daily for convenience / family request. Nutritional status was optimized with supplements when possible. Her Na was trended with daily BMPs and remained stable in low 130s for majority of hospital stay. Her sodium rose to 135 then 140 by day of discharge initially due to dehydration and then IV fluid rehydration.  AKI - developed rising creatinine to 1.75 from baseline 0.7 on 1/24 due to absent po intake for 1-2 days, light IV fluid hydration over the ensuing two days corrected her creatinine to 1.07 by day of discharge.  Discharge Vitals:   BP (!) 193/74 (BP Location: Right  Arm)   Pulse 93   Temp 98 F (36.7 C) (Oral)   Resp 16   Ht 5\' 4"  (1.626 m)   Wt 111 lb 12.8 oz (50.7 kg)   LMP 02/11/1975   SpO2 100%   BMI 19.19 kg/m   Pertinent Labs, Studies, and Procedures:  BMP Latest Ref Rng & Units 12/13/2016 12/12/2016 12/11/2016  Glucose 65 - 99 mg/dL 103(H) 170(H) 127(H)  BUN 6 - 20 mg/dL 29(H) 33(H) 34(H)  Creatinine 0.44 - 1.00 mg/dL 1.07(H) 1.12(H) 1.75(H)  BUN/Creat Ratio 12 - 28 - - -  Sodium 135 - 145 mmol/L 140 135 135  Potassium 3.5 - 5.1 mmol/L 3.9 4.2 4.3  Chloride 101 - 111 mmol/L 108 101 101  CO2 22 - 32 mmol/L 23 22 24   Calcium 8.9 - 10.3 mg/dL 9.9 10.2 9.9   Ct Head Wo Contrast  Result Date: 12/05/2016 CLINICAL DATA:  Less talkative ; not acting like herself EXAM: CT HEAD WITHOUT CONTRAST TECHNIQUE: Contiguous axial images were obtained from the base of the skull through the vertex without intravenous contrast. COMPARISON:  11/30/2016 FINDINGS: Brain: No acute territorial infarction, intracranial hemorrhage or focal mass lesion is visualized. Mild to moderate periventricular white matter hypodensity consistent with small vessel change. Ventricles are nonenlarged. No focal mass. Vascular: No hyperdense vessels.   No unexpected calcifications. Skull: Mastoid air cells clear.  No skull fracture. Sinuses/Orbits: Paranasal sinuses show mild mucosal thickening in the ethmoid sinuses. Small mucous retention cyst or sinus lobulated thickening right maxillary sinus. No acute orbital abnormality. Other: None IMPRESSION: No CT evidence for acute intracranial abnormality Electronically Signed   By: Donavan Foil M.D.   On: 12/05/2016 01:22   MR Brain Wo Contrast 12/10/2016 17:54  FINDINGS: Moderate to severely motion degraded examination. Fast sequences E performed.  BRAIN: No reduced diffusion to suggest acute ischemia. No susceptibility artifact to suggest hemorrhage. The ventricles and sulci are normal for patient's age. Patchy supratentorial white matter FLAIR T2 hyperintensities compatible with mild chronic small vessel ischemic disease, less than expected for age. No suspicious parenchymal signal, masses or mass effect. No abnormal extra-axial fluid collections. No extra-axial masses though, contrast enhanced sequences would be more sensitive.  VASCULAR: Normal major intracranial vascular flow voids present at skull base.  SKULL AND UPPER CERVICAL SPINE: No abnormal sellar expansion. No suspicious calvarial bone marrow signal. Craniocervical junction maintained.  SINUSES/ORBITS: Trace RIGHT posterior ethmoid mucosal thickening. Mastoid air cells are well aerated. The included ocular globes and orbital contents are non-suspicious, status post bilateral ocular lens implants. IMPRESSION: Moderate to severely motion degraded examination: Stable negative noncontrast MRI head for age.  Discharge Instructions: Discharge Instructions    Diet - low sodium heart healthy    Complete by:  As directed    Discharge instructions    Complete by:  As directed    Please continue to take your medications as prescribed.   We believe you have gotten sick due to your Imipramine being stopped too abruptly.  You can resume taking this medication 25 mg twice daily for one week and then you can increase to 50 mg twice daily.   Please follow up with our providers in the internal medicine clinic and Dr. Daryll Drown on 2/1 at 10:45 AM.  We have prescribed Clonidine 0.1 mg patch to apply once weekly, make sure to remove the previous patch each time. Your current patch was applied on 1/25.   Please drink a Glucerna shake three times daily to boost your  nutrition.  We have reduced your Lipitor to 20 mg daily We have reduced your Metoprolol succinate to 25 mg daily We have reduced your Valsartan (Diovan) to 160 mg daily. You can stop taking Amlodipine, Robitussin, Ativan, Zofran, Effexor, and Imdur. If your blood pressure starts getting elevated - you can start taking your Imdur again.  If you have any questions or concerns, please call the clinic at [phone] or if it is the weekend or after hours, you may call [phone] and ask for the internal medicine resident on call.  We wish you the best of luck in your recovery!   Increase activity slowly    Complete by:  As directed       Signed: Asencion Partridge, MD 12/17/2016, 9:12 PM   Pager: [phone]

## 2016-12-13 NOTE — Telephone Encounter (Signed)
Pt has been re-admitted. Currently in the hospital - plan is for d/c today.

## 2016-12-13 NOTE — Progress Notes (Addendum)
Patient discharged to home with home health. IV removed. All information shared with patient and family. AVS reviewed, medications reviewed, prescriptions sent to patients pharmacy, patient confirmed they had all belongings. Patient stable.

## 2016-12-13 NOTE — Discharge Instructions (Signed)
Please continue to take your medications as prescribed.   We believe you have gotten sick due to your Imipramine being stopped too abruptly. You can resume taking this medication 25 mg twice daily for one week and then you can increase to 50 mg twice daily.   Please follow up with our providers in the internal medicine clinic and Dr. Daryll Drown on 2/1 at 10:45 AM.  We have prescribed Clonidine 0.1 mg patch to apply once weekly, make sure to remove the previous patch each time. Your current patch was applied on 1/25.   Please drink a Glucerna shake three times daily to boost your nutrition.  We have reduced your Lipitor to 20 mg daily We have reduced your Metoprolol succinate to 25 mg daily We have reduced your Valsartan (Diovan) to 160 mg daily. You can stop taking Amlodipine, Robitussin, Ativan, Zofran, Effexor, and Imdur. If your blood pressure starts getting elevated - you can start taking your Imdur again.  If you have any questions or concerns, please call the clinic at [phone] or if it is the weekend or after hours, you may call [phone] and ask for the internal medicine resident on call.  We wish you the best of luck in your recovery!

## 2016-12-13 NOTE — Progress Notes (Signed)
  Date: 12/13/2016  Patient name: Christine [name]  Medical record number: [id number]  Date of birth: 01/24/1934   I have seen and evaluated this patient and I have discussed the plan of care with the house staff. Please see Dr. Durenda Age note for complete details. I concur with his findings.   This was a complicated course for [name].  Most likely explanation for her course of symptoms is TCA discontinuation syndrome. She was on imipramine for 35 years or so and this was stopped abruptly.  She is improving, which is expected given course of symptoms resolve in 7-14 days and she stopped the medication about 14 days ago.  Family opted to return to imipramine to help mood which has been started.  [name] will be seen in clinic very soon next week.   [name], MD 12/13/2016, 7:58 PM

## 2016-12-13 NOTE — Progress Notes (Signed)
12/12/2016 4:45 PM (Late Entry)   Patient son Christine [name] came informed Christine [name] that he wanted the number to Office of Patient Experience. This RN went and got a patient handbook and reviewed the number with Christine [name]. Verbalized understanding.   Spoke to patient regarding care concerns and assisted in service recovery.  Son then expressed concerns about wanting a second set of eyes to look at his mother. Mentioned that he was pleased with MD Christine [name] and MD Christine [name] and also pleased with nursing staff, but still dissatisfied with certain medication choices MD psych perscribed.   Paged Christine [name] who stated this patient does meet criteria for condition help, instructed this RN to call MDs.   Paged MD Christine [name] and alerted him of patient son concerns with a second opinion, Both Christine Drown MD and Christine Emery MD to round on patient on again. Christine [name] to round at bedside.   Spoke with Christine [name], patient daughter, in regard to medical record copies. She informed me that Christine [name] contacted her and alerted her that documents would not be ready today, as requested. Christine [name] verbalized no more concerns or questions at this time. Will continue to follow.    Christine [name] [employer], RN-BC [employer] Phone [phone]

## 2016-12-13 NOTE — Progress Notes (Signed)
Physical Therapy Treatment Patient Details Name: Christine [name] MRN: [id number] DOB: 01/24/1934 Today's Date: 12/13/2016    History of Present Illness Christine [name] is an 83yo female with PMH of HTN, HLD, hypothyroidism, CAD, T2DM, COPD, CKD presenting to [facility] for nausea, vomiting, diarrhea and agitated behavior.    PT Comments    Pt's affect and attention have improved since last session.  Pt still needing cues for initiation and direction, but general gait stability and overall conditioning have improved steadily.  Family will need to supervise pt at home.   Follow Up Recommendations  Home health PT     Equipment Recommendations  None recommended by PT    Recommendations for Other Services       Precautions / Restrictions Precautions Precautions: Fall    Mobility  Bed Mobility               General bed mobility comments: at EOB on arrival  Transfers Overall transfer level: Needs assistance Equipment used: None Transfers: Sit to/from Stand Sit to Stand: Min assist         General transfer comment: pt needs cues to initiate scooting/standing  Ambulation/Gait Ambulation/Gait assistance: Min assist Ambulation Distance (Feet): 450 Feet Assistive device: 1 person hand held assist Gait Pattern/deviations: Step-through pattern;Drifts right/left Gait velocity: WFL Gait velocity interpretation: at or above normal speed for age/gender General Gait Details: incrementally more steady, still scissors some to maintain balance, but does not drift significantly L now like last treatment   Stairs            Wheelchair Mobility    Modified Rankin (Stroke Patients Only)       Balance Overall balance assessment: Needs assistance Sitting-balance support: Feet supported;No upper extremity supported Sitting balance-Leahy Scale: Fair       Standing balance-Leahy Scale: Fair                      Cognition Arousal/Alertness: Awake/alert Behavior During  Therapy: Flat affect Overall Cognitive Status: Impaired/Different from baseline Area of Impairment: Awareness;Safety/judgement;Problem solving;Attention;Orientation Orientation Level: Situation;Time;Place Current Attention Level: Sustained;Focused     Safety/Judgement: Decreased awareness of safety;Decreased awareness of deficits Awareness: Intellectual Problem Solving: Slow processing;Decreased initiation;Difficulty sequencing      Exercises      General Comments        Pertinent Vitals/Pain Pain Assessment: Faces Faces Pain Scale: No hurt    Home Living                      Prior Function            PT Goals (current goals can now be found in the care plan section) Acute Rehab PT Goals Patient Stated Goal: pt unable PT Goal Formulation: Patient unable to participate in goal setting Time For Goal Achievement: 12/25/16 Potential to Achieve Goals: Good Progress towards PT goals: Progressing toward goals    Frequency    Min 3X/week      PT Plan Current plan remains appropriate    Co-evaluation             End of Session     Patient left: in bed;with call bell/phone within reach;with family/visitor present     Time: [id number] PT Time Calculation (min) (ACUTE ONLY): 18 min  Charges:  $Gait Training: 8-22 mins                    G Codes:      Christine Noa  V Irish [name] 12/13/2016, 5:13 PM  12/13/2016  Donnella Sham, PT [phone]  (pager)

## 2016-12-13 NOTE — Telephone Encounter (Signed)
Tanya from [facility] requesting VO. Please call back.

## 2016-12-13 NOTE — Progress Notes (Signed)
Subjective: Christine [name] is looking much better today. She is sitting up in bed, took her morning medications, and ate some food and drank a Glucerna shake this morning. She says she feels somewhat better than yesterday and is interested in going home. She received light IV fluids yesterday and this morning.  Objective: Vital signs in last 24 hours: Vitals:   12/12/16 2102 12/12/16 2123 12/13/16 0727 12/13/16 0840  BP: (!) 155/76  (!) 147/86   Pulse: (!) 103  (!) 101   Resp: 18  18   Temp: 98.2 F (36.8 C)  99.5 F (37.5 C)   TempSrc: Axillary  Oral   SpO2: 100% 99% 100% 99%  Weight: 111 lb 12.8 oz (50.7 kg)     Height:        Intake/Output Summary (Last 24 hours) at 12/13/16 1001 Last data filed at 12/13/16 0601  Gross per 24 hour  Intake            822.5 ml  Output                0 ml  Net            822.5 ml   Physical Exam General: Awake, sitting up in bed, tracks examiner, soft spoken and responds to simple questions Head: Normocephalic, atraumatic, moist MM Lungs: Clear to auscultation bilaterally, normal work of breathing Heart: RRR, systolic ejection murmur, no gallops or rubs.  Abdomen: Soft, non-tender, no distention, no guarding Extremities: Thin bulk and normal ROM, no cyanosis, clubbing, edema Neurologic: Awake and oriented x3, expressionless face, hypoactive, follows simple commands, some tremor present in arms bilaterally, grip and arm strength 4/5  Skin: Skin turgor improved, warm, dry, and intact  Psych: Blunted affect  Labs / Imaging / Procedures: CBC Latest Ref Rng & Units 12/10/2016 12/04/2016 12/03/2016  WBC 4.0 - 10.5 K/uL 8.9 10.2 6.2  Hemoglobin 12.0 - 15.0 g/dL 13.1 12.2 11.9(L)  Hematocrit 36.0 - 46.0 % 38.3 36.4 34.9(L)  Platelets 150 - 400 K/uL 432(H) 356 288   BMP Latest Ref Rng & Units 12/13/2016 12/12/2016 12/11/2016  Glucose 65 - 99 mg/dL 103(H) 170(H) 127(H)  BUN 6 - 20 mg/dL 29(H) 33(H) 34(H)  Creatinine 0.44 - 1.00 mg/dL 1.07(H) 1.12(H)  1.75(H)  BUN/Creat Ratio 12 - 28 - - -  Sodium 135 - 145 mmol/L 140 135 135  Potassium 3.5 - 5.1 mmol/L 3.9 4.2 4.3  Chloride 101 - 111 mmol/L 108 101 101  CO2 22 - 32 mmol/L 23 22 24   Calcium 8.9 - 10.3 mg/dL 9.9 10.2 9.9   No results found. Assessment/Plan:  TCA discontinuation syndrome, recent abrupt discontinuation of Imipramine (30 years) presented with agitation likely 2/2 venlafaxine, hypoactive delirium and bradykinesia , unresponsive to Sinemet. Had severe EPS/dystonic reaction to Haldol on 1/19. Restarted Imipramine which did not improve her nighttime agitation symptoms. Stopped all psych meds on 1/22 and patient remains bradykinetic. Brain MRI on 1/23 unremarkable, no ODS. Trial of Sinemet 1/23 - 1/25 showed no consistent improvement. Neurology consult on 1/25 concerned for hypoactive delirium, psychiatric related.  [facility] consulted and appreciate recs, TCA discontinuation syndrome  - Neurology consulted, appreciate recs - Family prefer to resume Imipramine 25 mg BID, increase to 50 mg BID after one week - to go home with [facility] PT  Severe malnutrition, now with minimal po intake and dehydration, 6.6% weight loss in 1 month - NS 75 cc/hr again today - Dietician rec Glucerna shake TID and  encourage po intake  Mild AKI, Cr improving - IVF as above  Constipation, patient with IBS and alternating constipation or diarrhea, also poor po intake. Family report no BM in several days -- home Miralax and Senna PRN -- home Fibercon  Hypertension, labile with SBP 200+ intermittently, pressure dropped with Sinemet, now high again as patient refusing po medications -- Clonidine 0.1 mg patch 7d -- Irbesartan to 150 mg daily -- Metoprolol XL to 25 mg   -- Imdur 30 mg this AM  Hyponatremia, patient with hyponatremia 2/2 "tea and toast" diet. Sodium normalized.  --NaCL 1g tabs QD --Ensure supplements BID between meals --Trend BMP  COPD, no symptoms, continue home Advair, Claritin,  Flonase  T2DM, on minimal oral medications.  --Hold home Amaryl --SSI-S WC  Hypothyroidism, TSH 2.136 on 11/26/2016 - holding home levothyroxine 29mcg daily  CAD, patient with LHC on 11/16 showing stable coronary disease with 40-50% L main stenosis and 70-70% D1 stenosis. No active chest pain, shortness of breath different from baseline, EKG with possible PAC's without acute ischemic changes. -- Plavix 75mg  daily -- atorvastatin reduced dose to 20mg  daily  HLD -- atorvastatin 20mg  daily.  GERD -- protonix 40mg  daily  Dispo: Anticipated discharge today.   LOS: 8 days   Asencion Partridge, MD 12/13/2016, 10:01 AM Pager: [phone]

## 2016-12-13 NOTE — Telephone Encounter (Signed)
Called tanya back, VO for [facility] for medication teaching, administration and eval for other home services also for PT eval and treat, do you agree?

## 2016-12-13 NOTE — Telephone Encounter (Signed)
Yes

## 2016-12-14 DIAGNOSIS — N183 Chronic kidney disease, stage 3 (moderate): Secondary | ICD-10-CM | POA: Diagnosis not present

## 2016-12-14 DIAGNOSIS — E871 Hypo-osmolality and hyponatremia: Secondary | ICD-10-CM | POA: Diagnosis not present

## 2016-12-14 DIAGNOSIS — E039 Hypothyroidism, unspecified: Secondary | ICD-10-CM | POA: Diagnosis not present

## 2016-12-14 DIAGNOSIS — F332 Major depressive disorder, recurrent severe without psychotic features: Secondary | ICD-10-CM | POA: Diagnosis not present

## 2016-12-14 DIAGNOSIS — Z7984 Long term (current) use of oral hypoglycemic drugs: Secondary | ICD-10-CM | POA: Diagnosis not present

## 2016-12-14 DIAGNOSIS — N3946 Mixed incontinence: Secondary | ICD-10-CM | POA: Diagnosis not present

## 2016-12-14 DIAGNOSIS — R410 Disorientation, unspecified: Secondary | ICD-10-CM | POA: Diagnosis not present

## 2016-12-14 DIAGNOSIS — J449 Chronic obstructive pulmonary disease, unspecified: Secondary | ICD-10-CM | POA: Diagnosis not present

## 2016-12-14 DIAGNOSIS — I5030 Unspecified diastolic (congestive) heart failure: Secondary | ICD-10-CM | POA: Diagnosis not present

## 2016-12-14 DIAGNOSIS — E43 Unspecified severe protein-calorie malnutrition: Secondary | ICD-10-CM | POA: Diagnosis not present

## 2016-12-14 DIAGNOSIS — I2510 Atherosclerotic heart disease of native coronary artery without angina pectoris: Secondary | ICD-10-CM | POA: Diagnosis not present

## 2016-12-14 DIAGNOSIS — I130 Hypertensive heart and chronic kidney disease with heart failure and stage 1 through stage 4 chronic kidney disease, or unspecified chronic kidney disease: Secondary | ICD-10-CM | POA: Diagnosis not present

## 2016-12-14 DIAGNOSIS — Z681 Body mass index (BMI) 19 or less, adult: Secondary | ICD-10-CM | POA: Diagnosis not present

## 2016-12-14 DIAGNOSIS — R471 Dysarthria and anarthria: Secondary | ICD-10-CM | POA: Diagnosis not present

## 2016-12-14 DIAGNOSIS — R634 Abnormal weight loss: Secondary | ICD-10-CM | POA: Diagnosis not present

## 2016-12-14 DIAGNOSIS — E1122 Type 2 diabetes mellitus with diabetic chronic kidney disease: Secondary | ICD-10-CM | POA: Diagnosis not present

## 2016-12-16 ENCOUNTER — Telehealth: Payer: Self-pay | Admitting: Internal Medicine

## 2016-12-16 DIAGNOSIS — E43 Unspecified severe protein-calorie malnutrition: Secondary | ICD-10-CM | POA: Diagnosis not present

## 2016-12-16 DIAGNOSIS — I130 Hypertensive heart and chronic kidney disease with heart failure and stage 1 through stage 4 chronic kidney disease, or unspecified chronic kidney disease: Secondary | ICD-10-CM | POA: Diagnosis not present

## 2016-12-16 DIAGNOSIS — E871 Hypo-osmolality and hyponatremia: Secondary | ICD-10-CM | POA: Diagnosis not present

## 2016-12-16 DIAGNOSIS — R634 Abnormal weight loss: Secondary | ICD-10-CM | POA: Diagnosis not present

## 2016-12-16 DIAGNOSIS — E039 Hypothyroidism, unspecified: Secondary | ICD-10-CM | POA: Diagnosis not present

## 2016-12-16 DIAGNOSIS — Z681 Body mass index (BMI) 19 or less, adult: Secondary | ICD-10-CM | POA: Diagnosis not present

## 2016-12-16 NOTE — Telephone Encounter (Signed)
[name] requesting VO on occupational and speech therapy. Please call back.

## 2016-12-16 NOTE — Telephone Encounter (Signed)
[place] care nurse would like a call back .

## 2016-12-17 ENCOUNTER — Telehealth: Payer: Self-pay

## 2016-12-17 ENCOUNTER — Telehealth: Payer: Self-pay | Admitting: Internal Medicine

## 2016-12-17 DIAGNOSIS — N179 Acute kidney failure, unspecified: Secondary | ICD-10-CM

## 2016-12-17 NOTE — Telephone Encounter (Signed)
Pt daughter requesting to speak with a nurse regarding mother health. Please call pt back.

## 2016-12-17 NOTE — Telephone Encounter (Signed)
Have called and given info of appt tomorrow to [facility] and VO for OT and PT

## 2016-12-17 NOTE — Telephone Encounter (Signed)
Agree 

## 2016-12-17 NOTE — Telephone Encounter (Signed)
Called daughter back, she is concerned about pt's intake and that she is not taking meds well. Spoke w/ dr Daryll Drown, will put pt in at 70 tomorrow, dr Daryll Drown will see her, this is agreeable w/ daughter also cautioned her if pt becomes worse or she thinks she cannot wait to please call 911 or take pt to ED, she agreed

## 2016-12-17 NOTE — Telephone Encounter (Signed)
Gave VO to annette for PT/ OT

## 2016-12-17 NOTE — Telephone Encounter (Signed)
Nursing orders  2x  2 weeks 1 x 3 weeks med management hypertension management

## 2016-12-17 NOTE — Telephone Encounter (Signed)
[facility] calls for VO for PT and OT eval and treat also [facility] 2x weekfor 2 weeks and 1x week for 3 weeks, VO given, do you agree?

## 2016-12-18 ENCOUNTER — Emergency Department (HOSPITAL_COMMUNITY): Payer: Medicare Other

## 2016-12-18 ENCOUNTER — Inpatient Hospital Stay (HOSPITAL_COMMUNITY)
Admission: EM | Admit: 2016-12-18 | Discharge: 2016-12-31 | DRG: 885 | Disposition: A | Discharge status: Home Health | Payer: Medicare Other | Attending: Student in an Organized Health Care Education/Training Program | Admitting: Student in an Organized Health Care Education/Training Program

## 2016-12-18 ENCOUNTER — Encounter (HOSPITAL_COMMUNITY): Payer: Self-pay | Admitting: Emergency Medicine

## 2016-12-18 ENCOUNTER — Ambulatory Visit: Payer: Medicare Other

## 2016-12-18 DIAGNOSIS — R451 Restlessness and agitation: Secondary | ICD-10-CM | POA: Diagnosis present

## 2016-12-18 DIAGNOSIS — I160 Hypertensive urgency: Secondary | ICD-10-CM | POA: Diagnosis present

## 2016-12-18 DIAGNOSIS — G479 Sleep disorder, unspecified: Secondary | ICD-10-CM | POA: Diagnosis present

## 2016-12-18 DIAGNOSIS — Z880 Allergy status to penicillin: Secondary | ICD-10-CM

## 2016-12-18 DIAGNOSIS — I130 Hypertensive heart and chronic kidney disease with heart failure and stage 1 through stage 4 chronic kidney disease, or unspecified chronic kidney disease: Secondary | ICD-10-CM | POA: Diagnosis present

## 2016-12-18 DIAGNOSIS — E1122 Type 2 diabetes mellitus with diabetic chronic kidney disease: Secondary | ICD-10-CM | POA: Diagnosis present

## 2016-12-18 DIAGNOSIS — F419 Anxiety disorder, unspecified: Secondary | ICD-10-CM | POA: Diagnosis present

## 2016-12-18 DIAGNOSIS — M353 Polymyalgia rheumatica: Secondary | ICD-10-CM | POA: Diagnosis present

## 2016-12-18 DIAGNOSIS — E11649 Type 2 diabetes mellitus with hypoglycemia without coma: Secondary | ICD-10-CM | POA: Diagnosis present

## 2016-12-18 DIAGNOSIS — Z8711 Personal history of peptic ulcer disease: Secondary | ICD-10-CM

## 2016-12-18 DIAGNOSIS — Z681 Body mass index (BMI) 19 or less, adult: Secondary | ICD-10-CM

## 2016-12-18 DIAGNOSIS — E440 Moderate protein-calorie malnutrition: Secondary | ICD-10-CM | POA: Diagnosis present

## 2016-12-18 DIAGNOSIS — E039 Hypothyroidism, unspecified: Secondary | ICD-10-CM | POA: Diagnosis present

## 2016-12-18 DIAGNOSIS — M1990 Unspecified osteoarthritis, unspecified site: Secondary | ICD-10-CM | POA: Diagnosis present

## 2016-12-18 DIAGNOSIS — J449 Chronic obstructive pulmonary disease, unspecified: Secondary | ICD-10-CM | POA: Diagnosis present

## 2016-12-18 DIAGNOSIS — F333 Major depressive disorder, recurrent, severe with psychotic symptoms: Secondary | ICD-10-CM | POA: Diagnosis not present

## 2016-12-18 DIAGNOSIS — Z888 Allergy status to other drugs, medicaments and biological substances status: Secondary | ICD-10-CM

## 2016-12-18 DIAGNOSIS — R1310 Dysphagia, unspecified: Secondary | ICD-10-CM | POA: Diagnosis present

## 2016-12-18 DIAGNOSIS — N189 Chronic kidney disease, unspecified: Secondary | ICD-10-CM

## 2016-12-18 DIAGNOSIS — E785 Hyperlipidemia, unspecified: Secondary | ICD-10-CM | POA: Diagnosis present

## 2016-12-18 DIAGNOSIS — E162 Hypoglycemia, unspecified: Secondary | ICD-10-CM

## 2016-12-18 DIAGNOSIS — N179 Acute kidney failure, unspecified: Secondary | ICD-10-CM | POA: Diagnosis present

## 2016-12-18 DIAGNOSIS — R4182 Altered mental status, unspecified: Secondary | ICD-10-CM | POA: Diagnosis not present

## 2016-12-18 DIAGNOSIS — E871 Hypo-osmolality and hyponatremia: Secondary | ICD-10-CM | POA: Diagnosis not present

## 2016-12-18 DIAGNOSIS — E119 Type 2 diabetes mellitus without complications: Secondary | ICD-10-CM

## 2016-12-18 DIAGNOSIS — I5032 Chronic diastolic (congestive) heart failure: Secondary | ICD-10-CM | POA: Diagnosis present

## 2016-12-18 DIAGNOSIS — F3342 Major depressive disorder, recurrent, in full remission: Secondary | ICD-10-CM | POA: Diagnosis present

## 2016-12-18 DIAGNOSIS — R339 Retention of urine, unspecified: Secondary | ICD-10-CM | POA: Diagnosis not present

## 2016-12-18 DIAGNOSIS — N183 Chronic kidney disease, stage 3 (moderate): Secondary | ICD-10-CM | POA: Diagnosis present

## 2016-12-18 DIAGNOSIS — K219 Gastro-esophageal reflux disease without esophagitis: Secondary | ICD-10-CM | POA: Diagnosis present

## 2016-12-18 DIAGNOSIS — I2510 Atherosclerotic heart disease of native coronary artery without angina pectoris: Secondary | ICD-10-CM | POA: Diagnosis present

## 2016-12-18 DIAGNOSIS — Z01818 Encounter for other preprocedural examination: Secondary | ICD-10-CM

## 2016-12-18 DIAGNOSIS — E876 Hypokalemia: Secondary | ICD-10-CM | POA: Diagnosis not present

## 2016-12-18 LAB — CBG MONITORING, ED
GLUCOSE-CAPILLARY: 288 mg/dL — AB (ref 65–99)
GLUCOSE-CAPILLARY: 245 mg/dL — AB (ref 65–99)
Glucose-Capillary: 22 mg/dL — CL (ref 65–99)
Glucose-Capillary: >600 mg/dL (ref 65–99)

## 2016-12-18 LAB — CBC WITH DIFFERENTIAL/PLATELET
BASOS PCT: 0 %
Basophils Absolute: 0 10*3/uL (ref 0.0–0.1)
EOS ABS: 0.1 10*3/uL (ref 0.0–0.7)
EOS PCT: 2 %
HCT: 39.5 % (ref 36.0–46.0)
HEMOGLOBIN: 12.9 g/dL (ref 12.0–15.0)
Lymphocytes Relative: 23 %
Lymphs Abs: 1.5 10*3/uL (ref 0.7–4.0)
MCH: 29.9 pg (ref 26.0–34.0)
MCHC: 32.7 g/dL (ref 30.0–36.0)
MCV: 91.4 fL (ref 78.0–100.0)
Monocytes Absolute: 0.4 10*3/uL (ref 0.1–1.0)
Monocytes Relative: 6 %
NEUTROS PCT: 69 %
Neutro Abs: 4.5 10*3/uL (ref 1.7–7.7)
PLATELETS: 366 10*3/uL (ref 150–400)
RBC: 4.32 MIL/uL (ref 3.87–5.11)
RDW: 14.8 % (ref 11.5–15.5)
WBC: 6.6 10*3/uL (ref 4.0–10.5)

## 2016-12-18 LAB — COMPREHENSIVE METABOLIC PANEL
ALK PHOS: 124 U/L (ref 38–126)
ALT: 42 U/L (ref 14–54)
ANION GAP: 13 (ref 5–15)
AST: 61 U/L — ABNORMAL HIGH (ref 15–41)
Albumin: 4.2 g/dL (ref 3.5–5.0)
BILIRUBIN TOTAL: 1.2 mg/dL (ref 0.3–1.2)
BUN: 40 mg/dL — ABNORMAL HIGH (ref 6–20)
CALCIUM: 10.1 mg/dL (ref 8.9–10.3)
CO2: 23 mmol/L (ref 22–32)
CREATININE: 1.28 mg/dL — AB (ref 0.44–1.00)
Chloride: 107 mmol/L (ref 101–111)
GFR, EST AFRICAN AMERICAN: 45 mL/min — AB (ref >60)
GFR, EST NON AFRICAN AMERICAN: 38 mL/min — AB (ref >60)
Glucose, Bld: 99 mg/dL (ref 65–99)
Potassium: 3.5 mmol/L (ref 3.5–5.1)
SODIUM: 143 mmol/L (ref 135–145)
TOTAL PROTEIN: 7.6 g/dL (ref 6.5–8.1)

## 2016-12-18 LAB — GLUCOSE, CAPILLARY
GLUCOSE-CAPILLARY: 151 mg/dL — AB (ref 65–99)
Glucose-Capillary: 50 mg/dL — ABNORMAL LOW (ref 65–99)
Glucose-Capillary: 93 mg/dL (ref 65–99)

## 2016-12-18 LAB — TSH: TSH: 1.223 u[IU]/mL (ref 0.350–4.500)

## 2016-12-18 LAB — I-STAT TROPONIN, ED: TROPONIN I, POC: 0.03 ng/mL (ref 0.00–0.08)

## 2016-12-18 LAB — INFLUENZA PANEL BY PCR (TYPE A & B)
Influenza A By PCR: NEGATIVE
Influenza B By PCR: NEGATIVE

## 2016-12-18 LAB — I-STAT CG4 LACTIC ACID, ED: LACTIC ACID, VENOUS: 1.54 mmol/L (ref 0.5–1.9)

## 2016-12-18 LAB — LIPASE, BLOOD: LIPASE: 23 U/L (ref 11–51)

## 2016-12-18 MED ORDER — HYPROMELLOSE (GONIOSCOPIC) 2.5 % OP SOLN
1.0000 [drp] | Freq: Every day | OPHTHALMIC | Status: DC
  Filled 2016-12-18: qty 15

## 2016-12-18 MED ORDER — ATORVASTATIN CALCIUM 20 MG PO TABS
20.0000 mg | ORAL_TABLET | Freq: Every evening | ORAL | Status: DC
  Administered 2016-12-18 – 2016-12-30 (×12): 20 mg via ORAL
  Filled 2016-12-18 (×12): qty 1

## 2016-12-18 MED ORDER — METOPROLOL TARTRATE 5 MG/5ML IV SOLN
2.5000 mg | Freq: Four times a day (QID) | INTRAVENOUS | Status: DC
  Administered 2016-12-18 – 2016-12-19 (×3): 2.5 mg via INTRAVENOUS
  Filled 2016-12-18 (×4): qty 5

## 2016-12-18 MED ORDER — POLYVINYL ALCOHOL 1.4 % OP SOLN
1.0000 [drp] | Freq: Every day | OPHTHALMIC | Status: DC
Start: 2016-12-18 — End: 2016-12-31
  Administered 2016-12-20 – 2016-12-30 (×7): 1 [drp] via OPHTHALMIC
  Filled 2016-12-18 (×2): qty 15

## 2016-12-18 MED ORDER — INSULIN ASPART 100 UNIT/ML ~~LOC~~ SOLN: 0.0000 [IU] | Freq: Three times a day (TID) | SUBCUTANEOUS | Status: DC

## 2016-12-18 MED ORDER — ONDANSETRON HCL 4 MG PO TABS
4.0000 mg | ORAL_TABLET | Freq: Four times a day (QID) | ORAL | Status: DC | PRN
  Filled 2016-12-18: qty 1

## 2016-12-18 MED ORDER — ONDANSETRON HCL 4 MG/2ML IJ SOLN: 4.0000 mg | Freq: Four times a day (QID) | INTRAMUSCULAR | Status: DC | PRN

## 2016-12-18 MED ORDER — DEXTROSE-NACL 5-0.45 % IV SOLN
INTRAVENOUS | Status: DC
  Administered 2016-12-18: 19:00:00 via INTRAVENOUS

## 2016-12-18 MED ORDER — MOMETASONE FURO-FORMOTEROL FUM 200-5 MCG/ACT IN AERO
2.0000 | INHALATION_SPRAY | Freq: Two times a day (BID) | RESPIRATORY_TRACT | Status: DC
  Administered 2016-12-18 – 2016-12-31 (×18): 2 via RESPIRATORY_TRACT
  Filled 2016-12-18 (×2): qty 8.8

## 2016-12-18 MED ORDER — MOMETASONE FURO-FORMOTEROL FUM 200-5 MCG/ACT IN AERO: 2.0000 | INHALATION_SPRAY | Freq: Two times a day (BID) | RESPIRATORY_TRACT | Status: DC

## 2016-12-18 MED ORDER — DEXTROSE 50 % IV SOLN
INTRAVENOUS | Status: AC
  Administered 2016-12-18: 50 mL
  Filled 2016-12-18: qty 50

## 2016-12-18 MED ORDER — HEPARIN SODIUM (PORCINE) 5000 UNIT/ML IJ SOLN
5000.0000 [IU] | Freq: Three times a day (TID) | INTRAMUSCULAR | Status: DC
  Filled 2016-12-18: qty 1

## 2016-12-18 MED ORDER — OLANZAPINE 5 MG PO TBDP
5.0000 mg | ORAL_TABLET | Freq: Once | ORAL | Status: DC | PRN
  Filled 2016-12-18: qty 1

## 2016-12-18 MED ORDER — LEVOTHYROXINE SODIUM 25 MCG PO TABS
25.0000 ug | ORAL_TABLET | Freq: Every day | ORAL | Status: DC
  Administered 2016-12-19 – 2016-12-31 (×11): 25 ug via ORAL
  Filled 2016-12-18 (×13): qty 1

## 2016-12-18 MED ORDER — IMIPRAMINE HCL 25 MG PO TABS
25.0000 mg | ORAL_TABLET | Freq: Two times a day (BID) | ORAL | Status: DC
  Administered 2016-12-18 – 2016-12-25 (×13): 25 mg via ORAL
  Filled 2016-12-18 (×20): qty 1

## 2016-12-18 MED ORDER — OLANZAPINE 5 MG PO TBDP
5.0000 mg | ORAL_TABLET | Freq: Once | ORAL | Status: DC
  Filled 2016-12-18: qty 1

## 2016-12-18 MED ORDER — KCL IN DEXTROSE-NACL 20-5-0.45 MEQ/L-%-% IV SOLN
Freq: Once | INTRAVENOUS | Status: AC
  Administered 2016-12-18: 10:00:00 via INTRAVENOUS
  Filled 2016-12-18: qty 1000

## 2016-12-18 MED ORDER — CLONIDINE HCL 0.1 MG/24HR TD PTWK
0.1000 mg | MEDICATED_PATCH | TRANSDERMAL | Status: DC
  Administered 2016-12-18: 0.1 mg via TRANSDERMAL
  Filled 2016-12-18: qty 1

## 2016-12-18 MED ORDER — WHITE PETROLATUM GEL
Status: AC
  Administered 2016-12-18: 21:00:00
  Filled 2016-12-18: qty 1

## 2016-12-18 MED ORDER — CLOPIDOGREL BISULFATE 75 MG PO TABS
75.0000 mg | ORAL_TABLET | Freq: Every evening | ORAL | Status: DC
  Administered 2016-12-18 – 2016-12-30 (×12): 75 mg via ORAL
  Filled 2016-12-18 (×12): qty 1

## 2016-12-18 MED ORDER — METOPROLOL SUCCINATE ER 25 MG PO TB24
25.0000 mg | ORAL_TABLET | Freq: Every day | ORAL | Status: DC
  Filled 2016-12-18: qty 1

## 2016-12-18 MED ORDER — DEXTROSE-NACL 5-0.9 % IV SOLN: INTRAVENOUS | Status: DC

## 2016-12-18 NOTE — H&P (Signed)
Date: 12/18/2016               Patient Name:  TANJI KUETHER MRN: [id number]  DOB: 01/24/1934 Age / Sex: 83 y.o., female   PCP: [name], MD         Medical Service: Internal Medicine Teaching Service         Attending Physician: Dr. Axel Filler, MD    First Contact: Dr. Wynetta Emery Pager: [phone]  Second Contact: Dr. Charlynn Grimes Pager: [phone]       After Hours (After 5p/  First Contact Pager: [phone]  weekends / holidays): Second Contact Pager: [phone]   Chief Complaint: altered mental status  History of Present Illness: Ms. Hornburg is an 63 year old mother of four with major depressive disorder complicated by moderate malnutrition, CAD, GERD, HLD, HTN, CKD Stage III who presented to the ED with altered mental status. As she could not communicate verbally with me, history was obtained from her son Charlotte Crumb and daughter Annamary Carolin.   For the last 3 days, she has not been eating and drinking well. She was given some of her medications over the last few days, including glimeperide. This morning, her daughter found her unresponsive. She checked her mother's blood pressure and found it to be 70/40, so she called EMS. Upon arrival, her CBG was found to be 60, so she was transferred to the ED where it was 22. Unfortunately, her refusal to eat has just been one of many manifestations of her depression, including becoming more sedentary and expressing thoughts of being better off dead. During her multiple hospitalizations this past month, imipramine had been adjusted to other medications, like hydroxyzine, venlafaxine, haloperidol, carbidopa/levodopa, all of which resulted in adverse effects. At baseline, she was independent in all of her ADLS and found faith to be a significant part of her life.   In the ED, her initial labwork was notable for elevated BUN 40, Crt 1.3, and she was started on D5-1/2 NS. Several hours later, she became more agitated, and IV fluids had to be discontinued as well  as cardiac monitoring. POC glucose resulted >600 however this was from a blood sample obtained from the peripheral IV where the IV fluids were been administered.    Meds:  Current Meds  Medication Sig  . acetaminophen (TYLENOL) 500 MG tablet Take 1,000 mg by mouth every 6 (six) hours as needed for headache. For pain  . ADVAIR DISKUS 250-50 MCG/DOSE AEPB inhale 1 dose by mouth every 12 hours  . Ascorbic Acid (VITAMIN C PO) Take 1 tablet by mouth daily.   Marland Kitchen atorvastatin (LIPITOR) 40 MG tablet Take 0.5 tablets (20 mg total) by mouth every evening.  . cloNIDine (CATAPRES - DOSED IN MG/24 HR) 0.1 mg/24hr patch Place 1 patch (0.1 mg total) onto the skin once a week.  . clopidogrel (PLAVIX) 75 MG tablet take 1 tablet by mouth every evening  . esomeprazole (NEXIUM) 40 MG capsule take 1 capsule by mouth once daily BEFORE BREAKFAST  . feeding supplement, GLUCERNA SHAKE, (GLUCERNA SHAKE) LIQD Take 237 mLs by mouth 3 (three) times daily between meals.  Marland Kitchen glimepiride (AMARYL) 1 MG tablet take 1 tablet by mouth once daily BEFORE BREAKFAST  . hydroxypropyl methylcellulose (ISOPTO TEARS) 2.5 % ophthalmic solution Place 1 drop into both eyes at bedtime.   Marland Kitchen imipramine (TOFRANIL) 25 MG tablet Take 1 tablet (25 mg total) by mouth 2 (two) times daily.  Marland Kitchen levothyroxine (SYNTHROID, LEVOTHROID) 25 MCG  tablet take 1 tablet by mouth once daily  . loratadine (CLARITIN) 10 MG tablet Take 10 mg by mouth daily as needed for allergies, rhinitis or itching.  . metoprolol succinate (TOPROL XL) 25 MG 24 hr tablet Take 1 tablet (25 mg total) by mouth daily.  . Multiple Vitamin (MULTIVITAMIN WITH MINERALS) TABS tablet Take 1 tablet by mouth daily.  Marland Kitchen NITROSTAT 0.4 MG SL tablet place 1 tablet under the tongue if needed every 5 minutes for chest pain for 3 doses IF NO RELIEF AFTER 3RD DOSE CALL PRESCRIBER OR 911.  . polycarbophil (FIBERCON) 625 MG tablet Take 1 tablet (625 mg total) by mouth daily.  . polyethylene glycol (MIRALAX  / GLYCOLAX) packet Take 17 g by mouth daily as needed.  . senna (SENOKOT) 8.6 MG TABS tablet Take 1 tablet (8.6 mg total) by mouth at bedtime as needed for mild constipation.  . sodium chloride 1 g tablet Take 1 tablet (1 g total) by mouth daily.  . valsartan (DIOVAN) 160 MG tablet Take 1 tablet (160 mg total) by mouth daily.     Allergies: Allergies as of 12/18/2016 - Review Complete 12/18/2016  Allergen Reaction Noted  . Aspirin Swelling and Other (See Comments)   . Atarax [hydroxyzine] Other (See Comments) 12/18/2016  . Nsaids Other (See Comments) 11/25/2016  . Codeine Nausea And Vomiting and Other (See Comments)   . Haldol [haloperidol] Other (See Comments) 12/06/2016  . Other Other (See Comments) 05/08/2016  . Penicillins Diarrhea 12/29/2011   Past Medical History:  Diagnosis Date  . Anemia   . Anginal pain ([place])   . Arthritis    "back, arms, hips; hands" (11/30/2015)  . Benign hypertensive heart and kidney disease with diastolic CHF, NYHA class II and CKD stage III ([place])   . Blood transfusion 1972   "after daughter born, attempted to give me blood; couldn't give it cause my blood was cold" (09/23/2013)  . CAD (coronary artery disease)    a. LHC 08/23/11: dLM 40-50%, oRI 40%, oCFX 40%, oD1 70% (small and not amenable to PCI).  LM lesion did not appear to be flow limiting.  Medical rx was recommended;  b. Echo 08/23/11: mild LVH, EF 60-65%, grade 1 diast dysfxn, mild BAE, PASP 24;  c. 02/2012  Cath: LM 50d, LAD 50p, D1 70ost, RI 70p, RCA ok->Med Rx;  d. 01/2013 Cardiolite: EF 88, no ischemia/infarct.  . Carotid stenosis    a. dopplers 10/12:  0-39% bilat ICA;  b. 10/2012 U/S: 0-39% bilat, f/u 1 yr (10/2013).  . Chronic bronchitis   . Depression   . Diverticulosis of colon (without mention of hemorrhage)   . GERD (gastroesophageal reflux disease)   . Heart murmur, systolic    2-D echo in December 2011 showed a normal EF with grade 1 diastolic dysfunction, trivial pulmonary  regurgitation and mildly elevated PA pressure at 37 mmHg probably secondary to her COPD.dynamic obstruction-mid cavity obliteration;  b. 02/2012 Echo: EF 60-65%, mild LVH, PASP 50mmHg.  Marland Kitchen History of stomach ulcers 1970's  . Hyperlipidemia   . Hypertension   . Hypothyroidism   . Migraines    a. Next atypical symptoms in the past. Patient was started on Neurontin for possible neuropathic origin of her pain.  . Polymyalgia rheumatica ([place])   . Shortness of breath on exertion    "just related to angina >1 yr ago" (09/23/2013)  . Sinus arrhythmia   . Type II diabetes mellitus ([place])    a. On oral hypoglycemic  agents.    Family History: Maternal grandmother had colon cancer.  Social History: Lives at home with her daughter and grandson. No smoking, alcohol, ongoing illicit drug use. Used to work in healthcare herself.  Review of Systems: A complete ROS was unable to be completed due to the patient's mental status.   Physical Exam: Blood pressure 153/64, pulse 84, temperature 97.8 F (36.6 C), temperature source Oral, resp. rate 18, last menstrual period 02/11/1975, SpO2 100 %. Physical Exam  Constitutional: No distress.  HENT:  Head: Normocephalic and atraumatic.  Eyes: Conjunctivae are normal. No scleral icterus.  Cardiovascular: Normal rate and regular rhythm.   Pulmonary/Chest: Effort normal. No respiratory distress.  Abdominal: Soft. Bowel sounds are normal. She exhibits no distension. There is no tenderness. There is no rebound.  Neurological: She displays normal reflexes.  Follows commands, like squeezing my fingers, but unable to verbalize. Tracks movements.  Skin: She is not diaphoretic.   Assessment & Plan by Problem: Principal Problem:   Hypoglycemia Active Problems:   Hypothyroidism   Type 2 diabetes mellitus, controlled (HCC)   Hyperlipidemia   GERD   Coronary artery disease   Chronic renal insufficiency, stage III (moderate)   Hypertensive urgency   Malnutrition of  moderate degree   MDD (major depressive disorder), recurrent severe, without psychosis ([place])  Ms. Vanness is an 51 year old mother of four with major depressive disorder complicated by moderate malnutrition, CAD, GERD, HLD, HTN, CKD Stage III hospitalized for hypoglycemia and found to have acute kidney injury.  Hypoglycemia: Secondary to taking glimiperide and poor oral intake. CBGs have been trending in the 200s on IV fluids, and she has never required insulin in the past. A1c have mostly trended <7 which is reassuring.  -Continue CBG check AC & HS to ensure she does not become hypoglycemic as she avoids oral intake  Acute on chronic kidney disease Stage III: Pre-renal from poor oral intake. Crt 1.3 [baseline 0.9-1.1].  -Continue IV fluids and recheck BMET tomorrow  Major depressive disorder complicated by moderate malnutrition: Unfortunately, her mood disorder has been controlled on her current medications, and she clearly has complications from it: malnutrition, acute kidney injury, and hypoglycemia. No active suicidal ideation per the family. -Treat agitation with newer-generation antipsychotics, like olanzapine or risperdal, which are available in IV formulation -Consider referral to inpatient geriatric psychiatry unit once agitation and blood sugars are stable  CAD: Continue clopidogrel 75 mg.   Hypothyroidism: Continue levothyroxine 25 mcg.  HLD: Continue atorvastatin 20 mg daily.   HTN: Continue clonidine 0.1 mg/patch -Switch to metoprolol 2.5 mg IV every 6 hours as she is not tolerating orals and could precipitate withdrawal tachycardia though unclear how much she was taking in the days prior to admission. Hold if HR<60. -Holding valsartan in the setting of acute kidney injury  #FEN:  -Diet: NPO   #DVT prophylaxis: heparin subcutaneous  #CODE STATUS: FULL CODE -Defer to daughter Louie Boston and son Charlotte Crumb [[phone]] if patients lacks decision-making  capacity -Confirmed with patient on admission  Dispo: Admit patient to Observation with expected length of stay less than 2 midnights.  Signed: Riccardo Dubin, MD 12/18/2016, 1:42 PM  Pager: [phone]

## 2016-12-18 NOTE — ED Triage Notes (Signed)
Pt to ER by [facility] from home where patient stays with family. Pt family activated EMS for minimal responsiveness this morning as well as low BP. On EMS arrival patient found to be hypertensive in 200's with CBG of 60. IV was initiated with D10 infusion, however in route infiltrated. On arrival to ER, EMS CBG reading 38, patient was given 1 mg glucagon IM. On arrival to room patient is alert but not speaking or following commands. CBG 22 when checked here, one amp of D50 given. BP 207/82. Per daughter patient has had multiple medications changed recently and since has had these symptoms recurrently. Also recently treated for hyponatremia.

## 2016-12-18 NOTE — ED Notes (Signed)
Placed a warm blanket around patient's hands

## 2016-12-18 NOTE — ED Notes (Signed)
Admitting notified of patient swelling to left arm, no IV present in that arm. Pulse present.

## 2016-12-18 NOTE — ED Notes (Signed)
Pt to xray

## 2016-12-18 NOTE — ED Provider Notes (Signed)
Emergency Department Provider Note   I have reviewed the triage vital signs and the nursing notes.   HISTORY  Chief Complaint Altered Mental Status   HPI Christine [name] is a 83 y.o. female with PMH of CKD, CAD, GERD, HLD, HTN, and hypothyroidism presents to the emergency department for evaluation of minimal responsiveness this morning and hypoglycemia. The family at bedside provides most of the history as the patient is sitting up in bed but not speaking. They report multiple hospitalizations for altered mental status and delirium. They were discharged on 1/28 with a diagnosis of discontinuation syndrome presumably after stopping the imipramine. Patient on this medication for many years and has since been restarted. Family states since returning home her confusion improved somewhat but yesterday she stopped eating entirely. She drank half of one of her shakes but has not been drinking other fluids. She did take her diabetes medication yesterday but not this morning.  The daughter found the patient with little responsiveness this morning she took a blood pressure which was mistakenly low. She called EMS who found the patient actually have elevated blood pressure and hypoglycemia. She was treated in route with D10 infusion but it infiltrated. On arrival to the emergency department the patient's blood sugars 22.    Level 5 caveat: Patient with AMS and not responding verbally.     Past Medical History:  Diagnosis Date  . Anemia   . Anginal pain ([place])   . Arthritis    "back, arms, hips; hands" (11/30/2015)  . Benign hypertensive heart and kidney disease with diastolic CHF, NYHA class II and CKD stage III ([place])   . Blood transfusion 1972   "after daughter born, attempted to give me blood; couldn't give it cause my blood was cold" (09/23/2013)  . CAD (coronary artery disease)    a. LHC 08/23/11: dLM 40-50%, oRI 40%, oCFX 40%, oD1 70% (small and not amenable to PCI).  LM lesion did not appear  to be flow limiting.  Medical rx was recommended;  b. Echo 08/23/11: mild LVH, EF 60-65%, grade 1 diast dysfxn, mild BAE, PASP 24;  c. 02/2012  Cath: LM 50d, LAD 50p, D1 70ost, RI 70p, RCA ok->Med Rx;  d. 01/2013 Cardiolite: EF 88, no ischemia/infarct.  . Carotid stenosis    a. dopplers 10/12:  0-39% bilat ICA;  b. 10/2012 U/S: 0-39% bilat, f/u 1 yr (10/2013).  . Chronic bronchitis   . Depression   . Diverticulosis of colon (without mention of hemorrhage)   . GERD (gastroesophageal reflux disease)   . Heart murmur, systolic    2-D echo in December 2011 showed a normal EF with grade 1 diastolic dysfunction, trivial pulmonary regurgitation and mildly elevated PA pressure at 37 mmHg probably secondary to her COPD.dynamic obstruction-mid cavity obliteration;  b. 02/2012 Echo: EF 60-65%, mild LVH, PASP 21mmHg.  Marland Kitchen History of stomach ulcers 1970's  . Hyperlipidemia   . Hypertension   . Hypothyroidism   . Migraines    a. Next atypical symptoms in the past. Patient was started on Neurontin for possible neuropathic origin of her pain.  . Polymyalgia rheumatica ([place])   . Shortness of breath on exertion    "just related to angina >1 yr ago" (09/23/2013)  . Sinus arrhythmia   . Type II diabetes mellitus ([place])    a. On oral hypoglycemic agents.    Patient Active Problem List   Diagnosis Date Noted  . Hypoglycemia 12/18/2016  . AKI (acute kidney injury) ([place]) 12/17/2016  .  Bradykinesia   . Delirium   . Dysarthria 12/01/2016  . MDD (major depressive disorder), recurrent severe, without psychosis ([place]) 11/30/2016  . Protein-calorie malnutrition, severe 11/30/2016  . Hypertensive urgency 11/27/2016  . Malnutrition of moderate degree 11/27/2016  . Appetite loss   . Constipation   . Hyponatremia 11/26/2016  . Elevated alkaline phosphatase level 09/18/2016  . Mixed incontinence urge and stress 05/09/2016  . Dizziness 04/11/2016  . Exertional dyspnea 09/11/2015  . Hypertensive retinopathy of both eyes,  grade 2 04/20/2015  . Lumbar spondylosis 06/16/2014  . Carpal tunnel syndrome 02/18/2014  . Right shoulder pain 02/02/2014  . Osteopenia 01/11/2013  . Routine adult health maintenance 01/11/2013  . Acute kidney injury superimposed on chronic kidney disease ([place]) 09/21/2012  . Recurrent cough 09/18/2012  . Diverticulosis 09/18/2012  . Allergic rhinitis 02/13/2012  . Carotid stenosis 09/25/2011  . Coronary artery disease 08/29/2011  . HEART MURMUR, SYSTOLIC [id number]  . Enlargement of clavicle 03/30/2008  . Solitary pulmonary nodule 12/15/2006  . Hyperlipidemia 11/28/2006  . Hypothyroidism 09/04/2006  . Type 2 diabetes mellitus, controlled ([place]) 09/04/2006  . Migraine 09/04/2006  . Essential hypertension 09/04/2006  . GERD 09/04/2006    Past Surgical History:  Procedure Laterality Date  . CARDIAC CATHETERIZATION N/A 09/26/2015   Procedure: Right/Left Heart Cath and Coronary Angiography;  Surgeon: Larey Dresser, MD;  Location: [facility];  Service: Cardiovascular;  Laterality: N/A;  . CATARACT EXTRACTION W/ INTRAOCULAR LENS  IMPLANT, BILATERAL Bilateral 1990's  . LEFT HEART CATHETERIZATION WITH CORONARY ANGIOGRAM N/A 03/16/2012   Procedure: LEFT HEART CATHETERIZATION WITH CORONARY ANGIOGRAM;  Surgeon: Hillary Bow, MD;  Location[facility] CATH LAB;  Service: Cardiovascular;  Laterality: N/A;  . VAGINAL HYSTERECTOMY  1976      Allergies Aspirin; Atarax [hydroxyzine]; Nsaids; Codeine; Haldol [haloperidol]; Other; and Penicillins  Family History  Problem Relation Age of Onset  . Colon cancer Maternal Grandmother   . Heart attack Maternal Grandmother   . COPD Father   . Other Mother     died of unknown causes in her 83's. Pt raised by grandmother.  . Pulmonary Hypertension Daughter     Social History Social History  Substance Use Topics  . Smoking status: Never Smoker  . Smokeless tobacco: Never Used  . Alcohol use No     Comment: 11/29/2016 "Last drink 1967"     Review of Systems  Level 5 caveat: Patient with AMS and hypoglycemia.   ____________________________________________   PHYSICAL EXAM:  VITAL SIGNS: ED Triage Vitals [12/18/16 0821]  Enc Vitals Group     BP (!) 207/82     Pulse Rate 92     Resp 15     Temp 97.8 F (36.6 C)     Temp Source Oral   Constitutional: Awake but not responding verbally.  Eyes: Conjunctivae are normal. PERRL. Head: Atraumatic. Nose: No congestion/rhinnorhea. Mouth/Throat: Mucous membranes are dry.  Oropharynx non-erythematous. Neck: No stridor.  Cardiovascular: Normal rate, regular rhythm. Good peripheral circulation. Grossly normal heart sounds.   Respiratory: Normal respiratory effort.  No retractions. Lungs CTAB. Gastrointestinal: Soft and nontender. No distention.  Musculoskeletal: No lower extremity tenderness nor edema. No gross deformities of extremities. Neurologic:  No gross focal neurologic deficits are appreciated.  Skin:  Skin is warm, dry and intact. No rash noted.   ____________________________________________   LABS (all labs ordered are listed, but only abnormal results are displayed)  Labs Reviewed  COMPREHENSIVE METABOLIC PANEL - Abnormal; Notable for the following:  Result Value   BUN 40 (*)    Creatinine, Ser 1.28 (*)    AST 61 (*)    GFR calc non Af Amer 38 (*)    GFR calc Af Amer 45 (*)    All other components within normal limits  GLUCOSE, CAPILLARY - Abnormal; Notable for the following:    Glucose-Capillary 50 (*)    All other components within normal limits  GLUCOSE, CAPILLARY - Abnormal; Notable for the following:    Glucose-Capillary 151 (*)    All other components within normal limits  CBG MONITORING, ED - Abnormal; Notable for the following:    Glucose-Capillary 22 (*)    All other components within normal limits  CBG MONITORING, ED - Abnormal; Notable for the following:    Glucose-Capillary 288 (*)    All other components within normal limits   CBG MONITORING, ED - Abnormal; Notable for the following:    Glucose-Capillary 245 (*)    All other components within normal limits  CBG MONITORING, ED - Abnormal; Notable for the following:    Glucose-Capillary >600 (*)    All other components within normal limits  LIPASE, BLOOD  CBC WITH DIFFERENTIAL/PLATELET  INFLUENZA PANEL BY PCR (TYPE A & B)  TSH  BASIC METABOLIC PANEL  I-STAT CG4 LACTIC ACID, ED  I-STAT TROPOININ, ED   ____________________________________________  EKG  EKG reviewed in MUSE. No STEMI.  ____________________________________________  RADIOLOGY  Dg Chest 2 View  Result Date: 12/18/2016 CLINICAL DATA:  Altered mental status this morning EXAM: CHEST  2 VIEW COMPARISON:  12/07/2016 FINDINGS: Heart and mediastinal contours are within normal limits. No focal opacities or effusions. No acute bony abnormality. IMPRESSION: No active cardiopulmonary disease. Electronically Signed   By: Rolm Baptise M.D.   On: 12/18/2016 09:26    ____________________________________________   PROCEDURES  Procedure(s) performed:   Procedures  None ____________________________________________   INITIAL IMPRESSION / ASSESSMENT AND PLAN / ED COURSE  Pertinent labs & imaging results that were available during my care of the patient were reviewed by me and considered in my medical decision making (see chart for details).  Patient resents to the emergency pertinent for evaluation of unresponsiveness this morning and hypoglycemia. She has a nonfocal neurological exam. Patient seems to be attempting to follow commands but is very weak throughout. No fevers or chills at home. Patient recently diagnosed with TCA discontinuation syndrome after stopping imipramine. Hypoglycemia likely due to very poor PO intake and taking Glimeprirde yesterday. Plan for labs, urinalysis, flu, chest x-ray and reassessment. Patient has had multiple CTs of the head along with MRI so will not repeat these  studies with no concern for recent fall.  Discussed patient's case with IM resident team with, Dr. Evette Doffing.  Recommend admission to med-surg, obs bed.  I will place holding orders per their request. Patient and family (if present) updated with plan. Care transferred to hospitalist service.  I reviewed all nursing notes, vitals, pertinent old records, EKGs, labs, imaging (as available).  ____________________________________________  FINAL CLINICAL IMPRESSION(S) / ED DIAGNOSES  Final diagnoses:  Hypoglycemia     MEDICATIONS GIVEN DURING THIS VISIT:  Medications  heparin injection 5,000 Units (not administered)  ondansetron (ZOFRAN) tablet 4 mg (not administered)    Or  ondansetron (ZOFRAN) injection 4 mg (not administered)  cloNIDine (CATAPRES - Dosed in mg/24 hr) patch 0.1 mg (0.1 mg Transdermal Patch Applied 12/18/16 1740)  levothyroxine (SYNTHROID, LEVOTHROID) tablet 25 mcg (not administered)  clopidogrel (PLAVIX) tablet 75 mg (75  mg Oral Given 12/18/16 1740)  atorvastatin (LIPITOR) tablet 20 mg (20 mg Oral Given 12/18/16 1740)  mometasone-formoterol (DULERA) 200-5 MCG/ACT inhaler 2 puff (not administered)  polyvinyl alcohol (LIQUIFILM TEARS) 1.4 % ophthalmic solution 1 drop (not administered)  metoprolol (LOPRESSOR) injection 2.5 mg (2.5 mg Intravenous Given 12/18/16 1741)  OLANZapine zydis (ZYPREXA) disintegrating tablet 5 mg (not administered)  imipramine (TOFRANIL) tablet 25 mg (not administered)  dextrose 5 %-0.45 % sodium chloride infusion ( Intravenous New Bag/Given 12/18/16 1857)  dextrose 50 % solution (50 mLs  Given 12/18/16 0830)  dextrose 5 % and 0.45 % NaCl with KCl 20 mEq/L infusion ( Intravenous Stopped 12/18/16 1503)  dextrose 50 % solution (50 mLs  Given 12/18/16 1809)     NEW OUTPATIENT MEDICATIONS STARTED DURING THIS VISIT:  None   Note:  This document was prepared using Dragon voice recognition software and may include unintentional dictation errors.  Nanda Quinton, MD Emergency Medicine   Margette Fast, MD 12/18/16 1933

## 2016-12-18 NOTE — ED Notes (Signed)
Patient placed on monitor, continuous pulse oximetry and blood pressure cuff; Shanon Brow, EMT performing EKG; visitors at bedside

## 2016-12-18 NOTE — ED Notes (Signed)
POCT CBG resulted 245; Danelle Berry, RN aware

## 2016-12-19 ENCOUNTER — Observation Stay (HOSPITAL_COMMUNITY): Payer: Medicare Other

## 2016-12-19 ENCOUNTER — Ambulatory Visit: Payer: Medicare Other

## 2016-12-19 DIAGNOSIS — F29 Unspecified psychosis not due to a substance or known physiological condition: Secondary | ICD-10-CM | POA: Diagnosis not present

## 2016-12-19 DIAGNOSIS — E039 Hypothyroidism, unspecified: Secondary | ICD-10-CM | POA: Diagnosis not present

## 2016-12-19 DIAGNOSIS — I2510 Atherosclerotic heart disease of native coronary artery without angina pectoris: Secondary | ICD-10-CM

## 2016-12-19 DIAGNOSIS — E11649 Type 2 diabetes mellitus with hypoglycemia without coma: Secondary | ICD-10-CM | POA: Diagnosis not present

## 2016-12-19 DIAGNOSIS — E871 Hypo-osmolality and hyponatremia: Secondary | ICD-10-CM | POA: Diagnosis not present

## 2016-12-19 DIAGNOSIS — E440 Moderate protein-calorie malnutrition: Secondary | ICD-10-CM | POA: Diagnosis not present

## 2016-12-19 DIAGNOSIS — E119 Type 2 diabetes mellitus without complications: Secondary | ICD-10-CM | POA: Diagnosis not present

## 2016-12-19 DIAGNOSIS — M7989 Other specified soft tissue disorders: Secondary | ICD-10-CM | POA: Diagnosis not present

## 2016-12-19 DIAGNOSIS — Z90710 Acquired absence of both cervix and uterus: Secondary | ICD-10-CM | POA: Diagnosis not present

## 2016-12-19 DIAGNOSIS — N179 Acute kidney failure, unspecified: Secondary | ICD-10-CM | POA: Diagnosis not present

## 2016-12-19 DIAGNOSIS — Z7984 Long term (current) use of oral hypoglycemic drugs: Secondary | ICD-10-CM | POA: Diagnosis not present

## 2016-12-19 DIAGNOSIS — R471 Dysarthria and anarthria: Secondary | ICD-10-CM | POA: Diagnosis not present

## 2016-12-19 DIAGNOSIS — I129 Hypertensive chronic kidney disease with stage 1 through stage 4 chronic kidney disease, or unspecified chronic kidney disease: Secondary | ICD-10-CM

## 2016-12-19 DIAGNOSIS — Z800 Family history of malignant neoplasm of digestive organs: Secondary | ICD-10-CM | POA: Diagnosis not present

## 2016-12-19 DIAGNOSIS — R1310 Dysphagia, unspecified: Secondary | ICD-10-CM | POA: Diagnosis not present

## 2016-12-19 DIAGNOSIS — N183 Chronic kidney disease, stage 3 (moderate): Secondary | ICD-10-CM

## 2016-12-19 DIAGNOSIS — Z681 Body mass index (BMI) 19 or less, adult: Secondary | ICD-10-CM | POA: Diagnosis not present

## 2016-12-19 DIAGNOSIS — M353 Polymyalgia rheumatica: Secondary | ICD-10-CM | POA: Diagnosis present

## 2016-12-19 DIAGNOSIS — Z888 Allergy status to other drugs, medicaments and biological substances status: Secondary | ICD-10-CM | POA: Diagnosis not present

## 2016-12-19 DIAGNOSIS — E1122 Type 2 diabetes mellitus with diabetic chronic kidney disease: Secondary | ICD-10-CM | POA: Diagnosis not present

## 2016-12-19 DIAGNOSIS — E43 Unspecified severe protein-calorie malnutrition: Secondary | ICD-10-CM | POA: Diagnosis not present

## 2016-12-19 DIAGNOSIS — E876 Hypokalemia: Secondary | ICD-10-CM | POA: Diagnosis not present

## 2016-12-19 DIAGNOSIS — R4182 Altered mental status, unspecified: Secondary | ICD-10-CM | POA: Diagnosis not present

## 2016-12-19 DIAGNOSIS — Z79899 Other long term (current) drug therapy: Secondary | ICD-10-CM | POA: Diagnosis not present

## 2016-12-19 DIAGNOSIS — I160 Hypertensive urgency: Secondary | ICD-10-CM | POA: Diagnosis not present

## 2016-12-19 DIAGNOSIS — R339 Retention of urine, unspecified: Secondary | ICD-10-CM | POA: Diagnosis not present

## 2016-12-19 DIAGNOSIS — Z8711 Personal history of peptic ulcer disease: Secondary | ICD-10-CM | POA: Diagnosis not present

## 2016-12-19 DIAGNOSIS — M1990 Unspecified osteoarthritis, unspecified site: Secondary | ICD-10-CM | POA: Diagnosis present

## 2016-12-19 DIAGNOSIS — Z01818 Encounter for other preprocedural examination: Secondary | ICD-10-CM | POA: Diagnosis not present

## 2016-12-19 DIAGNOSIS — R258 Other abnormal involuntary movements: Secondary | ICD-10-CM | POA: Diagnosis not present

## 2016-12-19 DIAGNOSIS — E162 Hypoglycemia, unspecified: Secondary | ICD-10-CM | POA: Diagnosis not present

## 2016-12-19 DIAGNOSIS — F333 Major depressive disorder, recurrent, severe with psychotic symptoms: Secondary | ICD-10-CM | POA: Diagnosis not present

## 2016-12-19 DIAGNOSIS — E785 Hyperlipidemia, unspecified: Secondary | ICD-10-CM

## 2016-12-19 DIAGNOSIS — R451 Restlessness and agitation: Secondary | ICD-10-CM | POA: Diagnosis present

## 2016-12-19 DIAGNOSIS — Z880 Allergy status to penicillin: Secondary | ICD-10-CM | POA: Diagnosis not present

## 2016-12-19 DIAGNOSIS — R410 Disorientation, unspecified: Secondary | ICD-10-CM | POA: Diagnosis not present

## 2016-12-19 DIAGNOSIS — I5032 Chronic diastolic (congestive) heart failure: Secondary | ICD-10-CM | POA: Diagnosis not present

## 2016-12-19 DIAGNOSIS — F332 Major depressive disorder, recurrent severe without psychotic features: Secondary | ICD-10-CM | POA: Diagnosis not present

## 2016-12-19 DIAGNOSIS — Z9889 Other specified postprocedural states: Secondary | ICD-10-CM | POA: Diagnosis not present

## 2016-12-19 DIAGNOSIS — I130 Hypertensive heart and chronic kidney disease with heart failure and stage 1 through stage 4 chronic kidney disease, or unspecified chronic kidney disease: Secondary | ICD-10-CM | POA: Diagnosis not present

## 2016-12-19 DIAGNOSIS — K219 Gastro-esophageal reflux disease without esophagitis: Secondary | ICD-10-CM | POA: Diagnosis present

## 2016-12-19 DIAGNOSIS — Z634 Disappearance and death of family member: Secondary | ICD-10-CM

## 2016-12-19 DIAGNOSIS — I959 Hypotension, unspecified: Secondary | ICD-10-CM | POA: Diagnosis not present

## 2016-12-19 LAB — GLUCOSE, CAPILLARY
GLUCOSE-CAPILLARY: 109 mg/dL — AB (ref 65–99)
GLUCOSE-CAPILLARY: 123 mg/dL — AB (ref 65–99)
GLUCOSE-CAPILLARY: 129 mg/dL — AB (ref 65–99)
GLUCOSE-CAPILLARY: 171 mg/dL — AB (ref 65–99)
Glucose-Capillary: 111 mg/dL — ABNORMAL HIGH (ref 65–99)
Glucose-Capillary: 117 mg/dL — ABNORMAL HIGH (ref 65–99)

## 2016-12-19 LAB — BASIC METABOLIC PANEL
ANION GAP: 11 (ref 5–15)
BUN: 29 mg/dL — AB (ref 6–20)
CO2: 22 mmol/L (ref 22–32)
Calcium: 9.6 mg/dL (ref 8.9–10.3)
Chloride: 110 mmol/L (ref 101–111)
Creatinine, Ser: 1.15 mg/dL — ABNORMAL HIGH (ref 0.44–1.00)
GFR calc Af Amer: 51 mL/min — ABNORMAL LOW (ref >60)
GFR, EST NON AFRICAN AMERICAN: 44 mL/min — AB (ref >60)
Glucose, Bld: 124 mg/dL — ABNORMAL HIGH (ref 65–99)
POTASSIUM: 3.3 mmol/L — AB (ref 3.5–5.1)
Sodium: 143 mmol/L (ref 135–145)

## 2016-12-19 MED ORDER — PANTOPRAZOLE SODIUM 40 MG PO TBEC
40.0000 mg | DELAYED_RELEASE_TABLET | Freq: Every day | ORAL | Status: DC
  Administered 2016-12-19 – 2016-12-31 (×11): 40 mg via ORAL
  Filled 2016-12-19 (×16): qty 1

## 2016-12-19 MED ORDER — METOPROLOL SUCCINATE ER 25 MG PO TB24
25.0000 mg | ORAL_TABLET | Freq: Every day | ORAL | Status: DC
  Administered 2016-12-19 – 2016-12-30 (×11): 25 mg via ORAL
  Filled 2016-12-19 (×11): qty 1

## 2016-12-19 MED ORDER — ENOXAPARIN SODIUM 30 MG/0.3ML ~~LOC~~ SOLN
30.0000 mg | SUBCUTANEOUS | Status: DC
  Administered 2016-12-20 – 2016-12-30 (×7): 30 mg via SUBCUTANEOUS
  Filled 2016-12-19 (×10): qty 0.3

## 2016-12-19 MED ORDER — POTASSIUM CHLORIDE CRYS ER 20 MEQ PO TBCR
40.0000 meq | EXTENDED_RELEASE_TABLET | Freq: Two times a day (BID) | ORAL | Status: AC
  Administered 2016-12-19: 40 meq via ORAL
  Filled 2016-12-19 (×3): qty 2

## 2016-12-19 MED ORDER — METOPROLOL TARTRATE 5 MG/5ML IV SOLN
5.0000 mg | Freq: Once | INTRAVENOUS | Status: AC
  Administered 2016-12-19: 5 mg via INTRAVENOUS
  Filled 2016-12-19: qty 5

## 2016-12-19 NOTE — Progress Notes (Signed)
Patient blood pressure rechecked with result of 182/86, P 91. IV site loss prior to administering Metoprolol IV. Report handed off to [place], [place] who will follow up with patient.

## 2016-12-19 NOTE — Progress Notes (Signed)
RN went in to give patient her plavix, lopressor and lipitor. Patient initially would not open her mouth to take medication. Patient's daughter then took medications from RN and put them in the patient's mouth. Patient held the medication in her mouth for about 20 minutes before she swallowed the medication. Patient did spit a small dribble of the fluid in her mouth out. RN unsure of how much medication if any was spit out.

## 2016-12-19 NOTE — Progress Notes (Signed)
   Subjective:  Patient does not verbally respond to questioning this morning. She does shake her head no to being in pain or discomfort.   Family in room and on the phone; discussed management of her diabetes in the short term while PO intake down and in agreement to hold glimepiride to avoid further hypoglycemia. They are in agreement with psych consult for further management of MDD.  Objective:  Vital signs in last 24 hours: Vitals:   12/19/16 0757 12/19/16 1020 12/19/16 1100 12/19/16 1150  BP: (!) 156/94 (!) 200/94 (!) 182/68 (!) 169/64  Pulse: 86 89 91   Resp:  15    Temp:   98.7 F (37.1 C)   TempSrc:   Oral   SpO2: 100%      Constitutional: sitting up in bed, nonverbal, does follow commands CV: RRR, systolic ejection murmur Resp: no increased work of breathing, CTAB Abd: soft, NDNT Neuro: appears intact - not able to fully evaluate; no rigidity Psych: nonverbal, blank faces.   Assessment/Plan:  Principal Problem:   Hypoglycemia Active Problems:   Hypothyroidism   Type 2 diabetes mellitus, controlled (HCC)   Hyperlipidemia   GERD   Coronary artery disease   Acute kidney injury superimposed on chronic kidney disease (HCC)   Hypertensive urgency   Malnutrition of moderate degree   MDD (major depressive disorder), recurrent severe, without psychosis ([place])  Hypoglycemia: Resolved. Holding glimepiride --f/u CBGs  AoCKD, stage 3: Cr improving to 1.15 (previously 1.28). Likely prerenal from poor PO intake. Received mild IV hydration overnight.  --f/u AM BMP  MDD complicated by malnutrition: Patient will benefit from inpatient [facility] stay for medication adjustment and close monitoring as right now her symptoms continue to worsen and are affecting her nutrition. Family is in agreement with speaking with psych again and open to the possibility of inpatient psych admission. --continue home imipramine 25mg  daily --Psych consult - appreciate their assistance  CAD,  HLD: --continue clopidogrel 75mg  daily --continue atorvastatin 20mg  daily  Hypothyroidism: --continue levothyroxine 84mcg daily  HTN: Patient continues to have hypertensive episodes that are worsened by her episodes of agitation. Holding valsartan in setting of AKI, but will likely be able to restart tomorrow.  --hold valsartan --continue clonidine 0.1mg  patch qweekly --restart home metoprolol 25mg  daily  Hypokalemia: Patient with K of 3.3 this AM. --KDur --f/u AM BMP  Dispo: Anticipated discharge in approximately 2-3 day(s).   Alphonzo Grieve, MD 12/19/2016, 11:59 AM Pager [phone]

## 2016-12-19 NOTE — Progress Notes (Signed)
VASCULAR LAB PRELIMINARY  PRELIMINARY  PRELIMINARY  PRELIMINARY  Left upper extremity venous duplex completed.    Preliminary report:  There is no DVT or SVT noted in the left upper extremity.   Zayanna Pundt, RVT 12/19/2016, 2:16 PM

## 2016-12-19 NOTE — Progress Notes (Signed)
Internal Medicine Attending:   I saw and examined the patient. I reviewed the resident's note and I agree with the resident's findings and plan as documented in the resident's note.  See my admission attestation from earlier today as needed. Patient was completely withdrawn on rounds today, not responsive to our questions. She is intermittently accepting oral medications. Hypoglycemia is now resolved. Hyponatremia also resolved. No signs of other metabolic or infectious disease. Current exam is not consistent with delirium. My assessment is that she is having worsening of her primary depression which may be causing psychotic features. We have asked psychiatry to evaluate the patient for medication management and inpatient psychiatric care. I have strongly recommended that the family work with the psychiatry team's recommendations; I told the family several times that management of Christine [name] depression is beyond my capabilities at this point.

## 2016-12-20 ENCOUNTER — Ambulatory Visit: Payer: Medicare Other | Admitting: Psychology

## 2016-12-20 DIAGNOSIS — I160 Hypertensive urgency: Secondary | ICD-10-CM

## 2016-12-20 DIAGNOSIS — Z888 Allergy status to other drugs, medicaments and biological substances status: Secondary | ICD-10-CM

## 2016-12-20 DIAGNOSIS — F332 Major depressive disorder, recurrent severe without psychotic features: Secondary | ICD-10-CM

## 2016-12-20 DIAGNOSIS — Z880 Allergy status to penicillin: Secondary | ICD-10-CM

## 2016-12-20 DIAGNOSIS — E43 Unspecified severe protein-calorie malnutrition: Secondary | ICD-10-CM

## 2016-12-20 DIAGNOSIS — E162 Hypoglycemia, unspecified: Secondary | ICD-10-CM

## 2016-12-20 DIAGNOSIS — Z9889 Other specified postprocedural states: Secondary | ICD-10-CM

## 2016-12-20 DIAGNOSIS — Z8249 Family history of ischemic heart disease and other diseases of the circulatory system: Secondary | ICD-10-CM

## 2016-12-20 DIAGNOSIS — N179 Acute kidney failure, unspecified: Secondary | ICD-10-CM

## 2016-12-20 DIAGNOSIS — R471 Dysarthria and anarthria: Secondary | ICD-10-CM

## 2016-12-20 DIAGNOSIS — F29 Unspecified psychosis not due to a substance or known physiological condition: Secondary | ICD-10-CM

## 2016-12-20 DIAGNOSIS — Z90710 Acquired absence of both cervix and uterus: Secondary | ICD-10-CM

## 2016-12-20 DIAGNOSIS — Z800 Family history of malignant neoplasm of digestive organs: Secondary | ICD-10-CM

## 2016-12-20 DIAGNOSIS — E11649 Type 2 diabetes mellitus with hypoglycemia without coma: Secondary | ICD-10-CM

## 2016-12-20 DIAGNOSIS — R258 Other abnormal involuntary movements: Secondary | ICD-10-CM

## 2016-12-20 DIAGNOSIS — Z79899 Other long term (current) drug therapy: Secondary | ICD-10-CM

## 2016-12-20 LAB — BASIC METABOLIC PANEL
Anion gap: 13 (ref 5–15)
BUN: 20 mg/dL (ref 6–20)
CALCIUM: 10.1 mg/dL (ref 8.9–10.3)
CHLORIDE: 110 mmol/L (ref 101–111)
CO2: 22 mmol/L (ref 22–32)
CREATININE: 1.07 mg/dL — AB (ref 0.44–1.00)
GFR calc Af Amer: 55 mL/min — ABNORMAL LOW (ref >60)
GFR, EST NON AFRICAN AMERICAN: 48 mL/min — AB (ref >60)
Glucose, Bld: 137 mg/dL — ABNORMAL HIGH (ref 65–99)
Potassium: 3.6 mmol/L (ref 3.5–5.1)
SODIUM: 145 mmol/L (ref 135–145)

## 2016-12-20 LAB — GLUCOSE, CAPILLARY
GLUCOSE-CAPILLARY: 153 mg/dL — AB (ref 65–99)
GLUCOSE-CAPILLARY: 58 mg/dL — AB (ref 65–99)
GLUCOSE-CAPILLARY: 63 mg/dL — AB (ref 65–99)
Glucose-Capillary: 110 mg/dL — ABNORMAL HIGH (ref 65–99)
Glucose-Capillary: 116 mg/dL — ABNORMAL HIGH (ref 65–99)
Glucose-Capillary: 117 mg/dL — ABNORMAL HIGH (ref 65–99)
Glucose-Capillary: 129 mg/dL — ABNORMAL HIGH (ref 65–99)
Glucose-Capillary: 137 mg/dL — ABNORMAL HIGH (ref 65–99)

## 2016-12-20 MED ORDER — OLANZAPINE 5 MG PO TBDP
5.0000 mg | ORAL_TABLET | Freq: Every day | ORAL | Status: DC
  Administered 2016-12-20 – 2016-12-23 (×4): 5 mg via ORAL
  Filled 2016-12-20 (×4): qty 1

## 2016-12-20 MED ORDER — POTASSIUM CHLORIDE CRYS ER 20 MEQ PO TBCR
40.0000 meq | EXTENDED_RELEASE_TABLET | Freq: Once | ORAL | Status: AC
  Administered 2016-12-20: 40 meq via ORAL
  Filled 2016-12-20: qty 2

## 2016-12-20 MED ORDER — OLANZAPINE 5 MG PO TBDP: 2.5000 mg | ORAL_TABLET | Freq: Every day | ORAL | Status: DC

## 2016-12-20 MED ORDER — IRBESARTAN 150 MG PO TABS
150.0000 mg | ORAL_TABLET | Freq: Every day | ORAL | Status: DC
  Administered 2016-12-20 – 2016-12-23 (×4): 150 mg via ORAL
  Filled 2016-12-20 (×7): qty 1

## 2016-12-20 NOTE — Progress Notes (Signed)
Internal Medicine Attending:   I saw and examined the patient. I reviewed the resident's note and I agree with the resident's findings and plan as documented in the resident's note.  83 year old woman who is hospital day #2 with severe depression with psychotic features causing progressively worsening functional status at home. Mild hypoglycemia is due to low oral intake and sulfonylurea, I anticipate it will resolve over the next few days as this medicine washes out. On rounds today the patient is awake spontaneously but not interactive, does not answer questions, does not follow or commands. The family is trying to feed her, but she just keeps food in her mouth and does not swallow. We were unable to talk with Dr. Louretta Shorten from the psychiatry service. He agrees that she has severe depression with psychosis and that she would benefit from inpatient psychiatry. The family would like to use the [facility] and we have initiated that transfer process. I think we're able to build some agreements over the need to add further medications to manage her severe depression. Her going to start by adding Zyprexa 5 mg this evening. Psychiatry will follow up tomorrow, we greatly appreciate their assistance.

## 2016-12-20 NOTE — Progress Notes (Signed)
Pt calmed down and is no longer complaining of chest pain, zyprexa given.

## 2016-12-20 NOTE — Consult Note (Signed)
[place] [facility] Consult   Reason for Consult: depression and psychosis Referring Physician:  Dr. Evette Doffing Patient Identification: Christine [name] MRN:  [id number] Principal Diagnosis: Hypoglycemia Diagnosis:   Patient Active Problem List   Diagnosis Date Noted  . Hypoglycemia [E16.2] 12/18/2016  . AKI (acute kidney injury) ([place]) [N17.9] 12/17/2016  . Bradykinesia [R25.8]   . Delirium [R41.0]   . Dysarthria [R47.1] 12/01/2016  . MDD (major depressive disorder), recurrent severe, without psychosis ([place]) [F33.2] 11/30/2016  . Protein-calorie malnutrition, severe [E43] 11/30/2016  . Hypertensive urgency [I16.0] 11/27/2016  . Malnutrition of moderate degree [E44.0] 11/27/2016  . Appetite loss [R63.0]   . Constipation [K59.00]   . Hyponatremia [E87.1] 11/26/2016  . Elevated alkaline phosphatase level [R74.8] 09/18/2016  . Mixed incontinence urge and stress [N39.46] 05/09/2016  . Dizziness [R42] 04/11/2016  . Exertional dyspnea [R06.09] 09/11/2015  . Hypertensive retinopathy of both eyes, grade 2 [H35.033] 04/20/2015  . Lumbar spondylosis [M47.816] 06/16/2014  . Carpal tunnel syndrome [G56.00] 02/18/2014  . Right shoulder pain [M25.511] 02/02/2014  . Osteopenia [M85.80] 01/11/2013  . Routine adult health maintenance [Z00.00] 01/11/2013  . Acute kidney injury superimposed on chronic kidney disease ([place]) [N17.9, N18.9] 09/21/2012  . Recurrent cough [R05] 09/18/2012  . Diverticulosis [K57.90] 09/18/2012  . Allergic rhinitis [J30.9] 02/13/2012  . Carotid stenosis [I65.29] 09/25/2011  . Coronary artery disease [I25.10] 08/29/2011  . HEART MURMUR, SYSTOLIC [Y65.9] 93/57/0177  . Enlargement of clavicle [M89.319] 03/30/2008  . Solitary pulmonary nodule [R91.1] 12/15/2006  . Hyperlipidemia [E78.5] 11/28/2006  . Hypothyroidism [E03.9] 09/04/2006  . Type 2 diabetes mellitus, controlled ([place]) [E11.9] 09/04/2006  . Migraine [G43.909] 09/04/2006  . Essential hypertension [I10]  09/04/2006  . GERD [K21.9] 09/04/2006    Total Time spent with patient: 1 hour  Subjective:   Christine [name] is a 83 y.o. female patient admitted with Depression, decreased psychomotor activity.  HPI:  Christine [name] is an 83yo female, seen, chart reviewed and case discussed with patient daughter who is at bedside and also Dr. Evette Doffing and his team. Patient presented with decreased psychomotor activity, decreased oral intake, disturbed sleep and also havin psychotic symptoms talking about her brother who is not in the room. Patient also making statement somebody's telling her she should end her life. Patient was seen for psychiatric consultation last two medical admissions. Her first admission was for hyponatremia and thought secondary to imipramine which was discontinued and replaced with Effexor X and hydroxyzine which helped her to stabilize the patient was not able to compliant with medication after going home secondary to agitation at nighttime. During her second admission patient received Haldol which leads to parkinsonian symptoms symptoms and the psychiatry consultation recommended medication benztropine, smaller dose of Seroquel and are Zyprexa. Reportedly patient was treated with the Sinemet and patient responded positively and then sent home with adding imipramine. Patient daughter reported patient was unable to take imipramine consistently which made her more depressed and also with psychotic symptoms at this time. Patient daughter requesting second psychiatry opinion and also requesting to be placed in geriatric psychiatry placement at, [facility] because she's not able to respond with her current medication management and continued to suffer with increased psychomotor activity, agitation, confusion and psychosis. Patient is not able to care for herself. Patient family is highly protective of her and also resistant to start any medication during this visit.  Medical history: Patient  with PMH of HTN, HLD, hypothyroidism, CAD, T2DM, COPD, CKD presenting to [facility] for hypoglycemia and decreased  functioning since discharged from the hospital on Friday.    Past Psychiatric History: Maj. depressive disorder, recurrent and was stable on imipramine 50 mg twice daily over 20-30 years in the past and received medication from the primary care physician. Patient has no history of acute psychiatric hospitalization.  Risk to Self: Is patient at risk for suicide?: No Risk to Others:   Prior Inpatient Therapy:   Prior Outpatient Therapy:    Past Medical History:  Past Medical History:  Diagnosis Date  . Anemia   . Anginal pain ([place])   . Arthritis    "back, arms, hips; hands" (11/30/2015)  . Benign hypertensive heart and kidney disease with diastolic CHF, NYHA class II and CKD stage III ([place])   . Blood transfusion 1972   "after daughter born, attempted to give me blood; couldn't give it cause my blood was cold" (09/23/2013)  . CAD (coronary artery disease)    a. LHC 08/23/11: dLM 40-50%, oRI 40%, oCFX 40%, oD1 70% (small and not amenable to PCI).  LM lesion did not appear to be flow limiting.  Medical rx was recommended;  b. Echo 08/23/11: mild LVH, EF 60-65%, grade 1 diast dysfxn, mild BAE, PASP 24;  c. 02/2012  Cath: LM 50d, LAD 50p, D1 70ost, RI 70p, RCA ok->Med Rx;  d. 01/2013 Cardiolite: EF 88, no ischemia/infarct.  . Carotid stenosis    a. dopplers 10/12:  0-39% bilat ICA;  b. 10/2012 U/S: 0-39% bilat, f/u 1 yr (10/2013).  . Chronic bronchitis   . Depression   . Diverticulosis of colon (without mention of hemorrhage)   . GERD (gastroesophageal reflux disease)   . Heart murmur, systolic    2-D echo in December 2011 showed a normal EF with grade 1 diastolic dysfunction, trivial pulmonary regurgitation and mildly elevated PA pressure at 37 mmHg probably secondary to her COPD.dynamic obstruction-mid cavity obliteration;  b. 02/2012 Echo: EF 60-65%, mild LVH, PASP 42mHg.  .Marland KitchenHistory of  stomach ulcers 1970's  . Hyperlipidemia   . Hypertension   . Hypothyroidism   . Migraines    a. Next atypical symptoms in the past. Patient was started on Neurontin for possible neuropathic origin of her pain.  . [place]umatica [place]   . Shortness of breath on exertion    "just related to angina >1 yr ago" (09/23/2013)  . Sinus arrhythmia   . Type II diabetes mellitus ([facility]    a. On oral hypoglycemic agents.    Past Surgical History:  Procedure Laterality Date  . CARDIAC CATHETERIZATION N/A 09/26/2015   Procedure: Right/Left Heart Cath and Coronary Angiography;  Surgeon: DLarey Dresser MD;  [facility];  Service: Cardiovascular;  Laterality: N/A;  . CATARACT EXTRACTION W/ INTRAOCULAR LENS  IMPLANT, BILATERAL Bilateral 1990's  . LEFT HEART CATHETERIZATION WITH CORONARY ANGIOGRAM N/A 03/16/2012   Procedure: LEFT HEART CATHETERIZATION WITH CORONARY ANGIOGRAM;  Surgeon: THillary Bow MD;  [facility];  Service: Cardiovascular;  Laterality: N/A;  . VAGINAL HYSTERECTOMY  1976   Family History:  Family History  Problem Relation Age of Onset  . Colon cancer Maternal Grandmother   . Heart attack Maternal Grandmother   . COPD Father   . Other Mother     died of unknown causes in her 253's Pt raised by grandmother.  . Pulmonary Hypertension Daughter    Family Psychiatric  History: Not significant  Social History:  History  Alcohol Use No    Comment: 11/29/2016 "Last drink  1967"     History  Drug Use No    Social History   Social History  . Marital status: Widowed    Spouse name: N/A  . Number of children: N/A  . Years of education: N/A   Social History Main Topics  . Smoking status: Never Smoker  . Smokeless tobacco: Never Used  . Alcohol use No     Comment: 11/29/2016 "Last drink 1967"  . Drug use: No  . Sexual activity: Not Currently   Other Topics Concern  . None   Social History Narrative   Never smoked. Lives in [place] with her  dtr.  Care for her daughter who has had a lung transplant. Retired Building surveyor.   Additional Social History:    Allergies:   Allergies  Allergen Reactions  . Aspirin Swelling and Other (See Comments)    Angioedema  . Atarax [Hydroxyzine] Other (See Comments)    psychosis  . Nsaids Other (See Comments)    "interacts with heart medications"  . Codeine Nausea And Vomiting and Other (See Comments)    "makes me deathly sick" - can take with nausea medicine   . Haldol [Haloperidol] Other (See Comments)    EPS - rigidity and dystonic reaction, very sensitive  . Other Other (See Comments)    MSG  . Penicillins Diarrhea    "real bad" Has patient had a PCN reaction causing immediate rash, facial/tongue/throat swelling, SOB or lightheadedness with hypotension: Yes Has patient had a PCN reaction causing severe rash involving mucus membranes or skin necrosis: No Has patient had a PCN reaction that required hospitalization No Has patient had a PCN reaction occurring within the last 10 years: Yes If all of the above answers are "NO", then may proceed with Cephalosporin use.     Labs:  Results for orders placed or performed during the hospital encounter of 12/18/16 (from the past 48 hour(s))  CBG monitoring, ED     Status: Abnormal   Collection Time: 12/18/16 12:05 PM  Result Value Ref Range   Glucose-Capillary 245 (H) 65 - 99 mg/dL   Comment 1 Notify RN    Comment 2 Document in Chart   CBG monitoring, ED     Status: Abnormal   Collection Time: 12/18/16  2:50 PM  Result Value Ref Range   Glucose-Capillary >600 (HH) 65 - 99 mg/dL  Glucose, capillary     Status: Abnormal   Collection Time: 12/18/16  6:01 PM  Result Value Ref Range   Glucose-Capillary 50 (L) 65 - 99 mg/dL   Comment 1 Notify RN   Glucose, capillary     Status: Abnormal   Collection Time: 12/18/16  6:22 PM  Result Value Ref Range   Glucose-Capillary 151 (H) 65 - 99 mg/dL  Glucose, capillary     Status: None    Collection Time: 12/18/16  8:13 PM  Result Value Ref Range   Glucose-Capillary 93 65 - 99 mg/dL  Glucose, capillary     Status: Abnormal   Collection Time: 12/19/16 12:06 AM  Result Value Ref Range   Glucose-Capillary 171 (H) 65 - 99 mg/dL  Glucose, capillary     Status: Abnormal   Collection Time: 12/19/16  4:14 AM  Result Value Ref Range   Glucose-Capillary 111 (H) 65 - 99 mg/dL  Basic metabolic panel     Status: Abnormal   Collection Time: 12/19/16  4:20 AM  Result Value Ref Range   Sodium 143 135 - 145 mmol/L  Potassium 3.3 (L) 3.5 - 5.1 mmol/L   Chloride 110 101 - 111 mmol/L   CO2 22 22 - 32 mmol/L   Glucose, Bld 124 (H) 65 - 99 mg/dL   BUN 29 (H) 6 - 20 mg/dL   Creatinine, Ser 1.15 (H) 0.44 - 1.00 mg/dL   Calcium 9.6 8.9 - 10.3 mg/dL   GFR calc non Af Amer 44 (L) >60 mL/min   GFR calc Af Amer 51 (L) >60 mL/min    Comment: (NOTE) The eGFR has been calculated using the CKD EPI equation. This calculation has not been validated in all clinical situations. eGFR's persistently <60 mL/min signify possible Chronic Kidney Disease.    Anion gap 11 5 - 15  Glucose, capillary     Status: Abnormal   Collection Time: 12/19/16  7:34 AM  Result Value Ref Range   Glucose-Capillary 109 (H) 65 - 99 mg/dL  Glucose, capillary     Status: Abnormal   Collection Time: 12/19/16 11:23 AM  Result Value Ref Range   Glucose-Capillary 117 (H) 65 - 99 mg/dL  Glucose, capillary     Status: Abnormal   Collection Time: 12/19/16  4:42 PM  Result Value Ref Range   Glucose-Capillary 129 (H) 65 - 99 mg/dL  Glucose, capillary     Status: Abnormal   Collection Time: 12/19/16  8:38 PM  Result Value Ref Range   Glucose-Capillary 123 (H) 65 - 99 mg/dL  Glucose, capillary     Status: Abnormal   Collection Time: 12/20/16 12:24 AM  Result Value Ref Range   Glucose-Capillary 58 (L) 65 - 99 mg/dL  Glucose, capillary     Status: Abnormal   Collection Time: 12/20/16 12:59 AM  Result Value Ref Range    Glucose-Capillary 117 (H) 65 - 99 mg/dL  Glucose, capillary     Status: Abnormal   Collection Time: 12/20/16  4:38 AM  Result Value Ref Range   Glucose-Capillary 116 (H) 65 - 99 mg/dL  Basic metabolic panel     Status: Abnormal   Collection Time: 12/20/16  7:19 AM  Result Value Ref Range   Sodium 145 135 - 145 mmol/L   Potassium 3.6 3.5 - 5.1 mmol/L   Chloride 110 101 - 111 mmol/L   CO2 22 22 - 32 mmol/L   Glucose, Bld 137 (H) 65 - 99 mg/dL   BUN 20 6 - 20 mg/dL   Creatinine, Ser 1.07 (H) 0.44 - 1.00 mg/dL   Calcium 10.1 8.9 - 10.3 mg/dL   GFR calc non Af Amer 48 (L) >60 mL/min   GFR calc Af Amer 55 (L) >60 mL/min    Comment: (NOTE) The eGFR has been calculated using the CKD EPI equation. This calculation has not been validated in all clinical situations. eGFR's persistently <60 mL/min signify possible Chronic Kidney Disease.    Anion gap 13 5 - 15  Glucose, capillary     Status: Abnormal   Collection Time: 12/20/16  8:01 AM  Result Value Ref Range   Glucose-Capillary 110 (H) 65 - 99 mg/dL    Current Facility-Administered Medications  Medication Dose Route Frequency Provider Last Rate Last Dose  . atorvastatin (LIPITOR) tablet 20 mg  20 mg Oral QPM Rushil Sherrye Payor, MD   20 mg at 12/19/16 1721  . cloNIDine (CATAPRES - Dosed in mg/24 hr) patch 0.1 mg  0.1 mg Transdermal Weekly Rushil Sherrye Payor, MD   0.1 mg at 12/18/16 1740  . clopidogrel (PLAVIX) tablet 75 mg  75 mg Oral QPM Rushil Sherrye Payor, MD   75 mg at 12/19/16 1721  . enoxaparin (LOVENOX) injection 30 mg  30 mg Subcutaneous Q24H Alphonzo Grieve, MD      . imipramine (TOFRANIL) tablet 25 mg  25 mg Oral BID Asencion Partridge, MD   25 mg at 12/19/16 2153  . levothyroxine (SYNTHROID, LEVOTHROID) tablet 25 mcg  25 mcg Oral QAC breakfast Riccardo Dubin, MD   25 mcg at 12/20/16 [phone]  . metoprolol succinate (TOPROL-XL) 24 hr tablet 25 mg  25 mg Oral QAC supper Alphonzo Grieve, MD   25 mg at 12/19/16 1721  . mometasone-formoterol (DULERA) 200-5  MCG/ACT inhaler 2 puff  2 puff Inhalation BID Axel Filler, MD   2 puff at 12/19/16 2028  . ondansetron (ZOFRAN) tablet 4 mg  4 mg Oral Q6H PRN Rushil Sherrye Payor, MD       Or  . ondansetron (ZOFRAN) injection 4 mg  4 mg Intravenous Q6H PRN Rushil Sherrye Payor, MD      . pantoprazole (PROTONIX) EC tablet 40 mg  40 mg Oral Daily Axel Filler, MD   40 mg at 12/19/16 1300  . polyvinyl alcohol (LIQUIFILM TEARS) 1.4 % ophthalmic solution 1 drop  1 drop Both Eyes QHS Axel Filler, MD      . potassium chloride SA (K-DUR,KLOR-CON) CR tablet 40 mEq  40 mEq Oral BID Alphonzo Grieve, MD   40 mEq at 12/19/16 1258    Musculoskeletal: Strength & Muscle Tone: decreased Gait & Station: unable to stand Patient leans: N/A  Psychiatric Specialty Exam: Physical Exam as per history and physical   ROS decreased psychomotor activity, increase confusion, delusions psychosis, disturbed sleep and appetite. No Fever-chills, No Headache, No changes with Vision or hearing, reports vertigo No problems swallowing food or Liquids, No Chest pain, Cough or Shortness of Breath, No Abdominal pain, No Nausea or Vommitting, Bowel movements are regular, No Blood in stool or Urine, No dysuria, No new skin rashes or bruises, No new joints pains-aches,  No new weakness, tingling, numbness in any extremity, No recent weight gain or loss, No polyuria, polydypsia or polyphagia,   A full 10 point Review of Systems was done, except as stated above, all other Review of Systems were negative.  Blood pressure (!) 199/88, pulse 79, temperature 98.1 F (36.7 C), temperature source Oral, resp. rate 16, last menstrual period 02/11/1975, SpO2 99 %.There is no height or weight on file to calculate BMI.  General Appearance: Guarded  Eye Contact:  Fair  Speech:  Slow  Volume:  Decreased  Mood:  Depressed  Affect:  Depressed and Flat  Thought Process:  Coherent and Goal Directed  Orientation:  Full (Time, Place, and  Person)  Thought Content:  Rumination  Suicidal Thoughts:  No  Homicidal Thoughts:  No  Memory:  Immediate;   Fair Recent;   Fair Remote;   Good  Judgement:  Fair  Insight:  Fair  Psychomotor Activity:  Psychomotor Retardation  Concentration:  Concentration: Fair and Attention Span: Fair  Recall:  [place] of Knowledge:  Good  Language:  Good  Akathisia:  Negative  Handed:  Right  AIMS (if indicated):     Assets:  Communication Skills Desire for Improvement Financial Resources/Insurance Housing Leisure Time Resilience Social Support Transportation  ADL's:  Impaired  Cognition:  Impaired,  Mild  Sleep:        Treatment Plan Summary: This is a 83 years old female  with chronic depressive disorder presented with increased confusion, agitation disturbed sleep and appetite and increased psychomotor retardation.  Medication recommendations: Patient will be monitored without  typical antipsychotics and also benzodiazepines which can cause more confusion and agitation including EPS Patient benefit from Benztropine 0.5 mg twice daily for EPS from being hydrated   Patient benefit from Seroquel 25 mg twice daily for EPS and confusion Zyprexa Zydis 5 mg at bedtime for appetite and sleep Will ask psychiatric consultation team tomorrow to follow-up with the patient for second psychiatric opinion   Patient meet criteria for acute psychiatric hospitalization as she was not able to care for herself and has been deteriorated with the multiple acute medical admissions and increased depression with psychosis.  Please contact units social service regarding geriatric psychiatry placement at [facility] as requested with patient family. Please contact [phone] or [phone] if needs further assistance   Disposition: Recommend psychiatric Inpatient admission when medically cleared. Supportive therapy provided about ongoing stressors.  Ambrose Finland, MD 12/20/2016 9:45  AM

## 2016-12-20 NOTE — Progress Notes (Signed)
Pt was noted to be agitated, very confuse , when asked if shes in pain she said yes and pointed at her chest, talked to daughter and she agreed for her mom to take the zyprexa which she refused when first offered. VS BP 174/78, PR 123, RR 22 O2 sat 100%. Called IMTS on call MD to inform.

## 2016-12-20 NOTE — Progress Notes (Signed)
   Subjective:  Patient sitting up in bed this AM, appears alert but does not respond appropriately to most questions (though is talking). Family states she ate a little bit of dinner last night. They report she was up most of the night, very restless and anxious. Patient keeps pointing to chest but denies pain or shortness of breath. EKG last night when she was doing the same is largely unchanged from priors.  During team rounds, patient would not verbally respond.   Objective:  Vital signs in last 24 hours: Vitals:   12/19/16 1849 12/19/16 2251 12/20/16 0457 12/20/16 1329  BP: (!) 185/76 (!) 186/107 (!) 199/88 (!) 182/95  Pulse: 90 82 79 (!) 118  Resp: 16 15 16 20   Temp:  98.2 F (36.8 C) 98.1 F (36.7 C) 98.7 F (37.1 C)  TempSrc:  Axillary Oral Oral  SpO2:  98% 99% 100%   Constitutional: sitting up in bed, , does follow commands CV: RRR, systolic ejection murmur Resp: no increased work of breathing, CTAB Abd: soft, NDNT Msk: no calf tenderness or palpable chords Neuro: appears intact - not able to fully evaluate; no rigidity Psych: intermittently nonverbal, masked faces.   Assessment/Plan:  Principal Problem:   Hypoglycemia Active Problems:   Hypothyroidism   Type 2 diabetes mellitus, controlled (HCC)   Hyperlipidemia   GERD   Coronary artery disease   Acute kidney injury superimposed on chronic kidney disease (HCC)   Hypertensive urgency   Malnutrition of moderate degree   MDD (major depressive disorder), recurrent severe, without psychosis ([place])  MDD with psychotic features, complicated by malnutrition: From IM standpoint, metabolic and infectious sources of her current state have been ruled out over the last hospitalizations. Patient evaluated by and discussed with Dr. Louretta Shorten with psychiatry who recommends inpatient [facility] admission when medically cleared. He also provided medication recommendations as he has on previous hospitalization - olanzapine 5mg  qhs for  sleep and appetite, benztropine 0.5mg  BID and seroquel 25mg  BID for EPS and confusion. Psych consult team will also evaluate patient tomorrow per family request for second opinion; they do agree with pursuing inpatient psych placement. Family is requesting geriatric psychiatry placement at [facility]. Family very weary of starting medications during hospitalization but will discuss as family --Appreciate psych assistance and recommendatioins --continue home imipramine 25mg  daily --family will think about starting olanzapine 5mg  sl qhs - order is in --will avoid typical antipsychotics and benzodiazepines for acute confusion and agitation --family requesting possible transfer to [facility] - alerted social work and will start process - appreciate their help  Hypoglycemia: Patient had further episode of hypoglycemia corrected with D50 overnight. Still having decreased PO intake. If family agrees to start olanzapine, we expect her appetite to improve and help control hypoglycemic episodes.  --f/u CBGs  AoCKD, stage 3: Continuing to improve, and almost at baseline - Cr 1.07.  CAD, HLD: --continue clopidogrel 75mg  daily --continue atorvastatin 20mg  daily  Hypothyroidism: --continue levothyroxine 72mcg daily  HTN: Patient continues to have hypertensive episodes that are worsened by her episodes of agitation. Holding valsartan in setting of AKI, but will likely be able to restart tomorrow.  --hold valsartan --continue clonidine 0.1mg  patch qweekly --restart home metoprolol 25mg  daily; switch to IV if necessary  Hypokalemia: Patient with K of 3.6 this AM - uncertain of how much KDUR patient actually received yesterday. --Kdur --f/u AM BMP  Dispo: Anticipated discharge in approximately 2-3 day(s).   Alphonzo Grieve, MD 12/20/2016, 2:18 PM Pager [phone]

## 2016-12-20 NOTE — Care Management Note (Signed)
Case Management Note  Patient Details  Name: ELESA DEASY MRN: [id number] Date of Birth: 01/24/1934  Subjective/Objective:                    Action/Plan:  Prior to admission patient has at home with HHRN/PT with [facility]  Expected Discharge Date:                  Expected Discharge Plan:  [facility]  In-House Referral:  Clinical Social Work  Discharge planning Services     Post Acute Care Choice:    Choice offered to:     DME Arranged:    DME Agency:     HH Arranged:    [place] Agency:     Status of Service:  Completed, signed off  If discussed at [employer], dates discussed:    Additional Comments:  Marilu Favre, RN 12/20/2016, 2:03 PM

## 2016-12-20 NOTE — Progress Notes (Signed)
Report obtained.  Taking over care of pt at this time.  Pt wandering in room with family as standby assist.  Pt confused and delirious.  No s/s of any acute distress noted at this time.  Call bell in sight of family.

## 2016-12-21 LAB — GLUCOSE, CAPILLARY
GLUCOSE-CAPILLARY: 106 mg/dL — AB (ref 65–99)
GLUCOSE-CAPILLARY: 125 mg/dL — AB (ref 65–99)
GLUCOSE-CAPILLARY: 153 mg/dL — AB (ref 65–99)
GLUCOSE-CAPILLARY: 160 mg/dL — AB (ref 65–99)
GLUCOSE-CAPILLARY: 162 mg/dL — AB (ref 65–99)
Glucose-Capillary: 132 mg/dL — ABNORMAL HIGH (ref 65–99)
Glucose-Capillary: 167 mg/dL — ABNORMAL HIGH (ref 65–99)
Glucose-Capillary: 31 mg/dL — CL (ref 65–99)

## 2016-12-21 LAB — BASIC METABOLIC PANEL
ANION GAP: 12 (ref 5–15)
BUN: 20 mg/dL (ref 6–20)
CHLORIDE: 113 mmol/L — AB (ref 101–111)
CO2: 21 mmol/L — ABNORMAL LOW (ref 22–32)
Calcium: 10.1 mg/dL (ref 8.9–10.3)
Creatinine, Ser: 1.36 mg/dL — ABNORMAL HIGH (ref 0.44–1.00)
GFR, EST AFRICAN AMERICAN: 41 mL/min — AB (ref >60)
GFR, EST NON AFRICAN AMERICAN: 36 mL/min — AB (ref >60)
Glucose, Bld: 144 mg/dL — ABNORMAL HIGH (ref 65–99)
POTASSIUM: 4 mmol/L (ref 3.5–5.1)
SODIUM: 146 mmol/L — AB (ref 135–145)

## 2016-12-21 MED ORDER — SODIUM CHLORIDE 0.9 % IV BOLUS (SEPSIS)
500.0000 mL | Freq: Once | INTRAVENOUS | Status: AC
  Administered 2016-12-21: 500 mL via INTRAVENOUS

## 2016-12-21 MED ORDER — DEXTROSE 50 % IV SOLN
INTRAVENOUS | Status: AC
  Administered 2016-12-21: 12.5 g
  Filled 2016-12-21: qty 50

## 2016-12-21 NOTE — Progress Notes (Signed)
Pt able to slightly calm. Still hallucinating but at least not getting up and screaming. VSS. BP=161/72, HR=133, O2sat=100% on room air CBG=160.

## 2016-12-21 NOTE — Progress Notes (Signed)
Pt woke up very confuse and agitated. Keep saying "There are babies crying! Please!". Denies any pain at this time. Family at bedside trying to redirect pt. Made on call aware.

## 2016-12-21 NOTE — Progress Notes (Signed)
   Subjective:  Patient sitting on couch with daughter watching gospel videos on her computer. She is yelling out things at times that do not make sense. She is cooperative with examination and following some simple commands. Appears to have been more agitated overnight. Family was upset at one point that we were not monitoring her on telemetry as they read that arrhthymias could be a side effect from the Zyprexa. They are also concerned that she is not eating or drinking much. Report she is only eating some apple sauce to take her medications and nothing else. Daughter reports she has been complaining of chest pain. Family would like to continue to pursue inpatient psych placement.   Objective:  Vital signs in last 24 hours: Vitals:   12/21/16 0248 12/21/16 0518 12/21/16 0703 12/21/16 1349  BP:  (!) 179/84 (!) 180/70 (!) 166/82  Pulse: (!) 125 (!) 129 (!) 112 99  Resp: 20 (!) 24 20 18   Temp:   98.3 F (36.8 C) 98.7 F (37.1 C)  TempSrc:   Oral Oral  SpO2: 100% 100% 100% 100%   Physical Exam  Constitutional:  Thin elderly woman in no acute distress. Yells out incoherent things at times. Cooperative with exam. Following some basic commands.   Cardiovascular: Regular rhythm.   No murmur heard. Tachycardic   Pulmonary/Chest: Effort normal and breath sounds normal.  Skin: Skin is warm and dry.   Assessment/Plan:  MDD with psychotic features, complicated by malnutrition: Throughout work up for organic disease has been negative. She was started on Zyprexa last night. Appears to be more interactive today but does not answer questions. Does appear to still be having hallucinations and delusions. She is out of bed for the first time and does cooperate with some simple commands on exam for the first time.  Dr. Louretta Shorten recommended inpatient [facility] admission and family is in agreement with this plan. He also provided medication recommendations as he has on previous hospitalization - olanzapine  5mg  qhs for sleep and appetite, benztropine 0.5mg  BID and seroquel 25mg  BID for EPS and confusion. Family very weary of starting medications during hospitalization and it is challenging to make any changes in her regimen.  Appreciate psych's help on this challenging case.  --continue home imipramine 25mg  daily --continue olanzapine 5mg  sl qhs --will avoid typical antipsychotics and benzodiazepines for acute confusion and agitation --working on [employer] transfer  Hypoglycemia: No further episodes of hypoglycemia. Still having poor PO intake. Expect this to improve slow after starting the olanzapine yesterday.   --f/u CBGs  AoCKD, stage 3: Cr 1.07 > 1.36 today. Likely secondary to poor PO intake. Will bolus with 500 mL NS. .  CAD, HLD: --continue clopidogrel 75mg  daily --continue atorvastatin 20mg  daily  Hypothyroidism: --continue levothyroxine 5mcg daily  HTN: Continues to remain hypertensive. BP 166/62 today.  --restarted valsartan --continue clonidine 0.1mg  patch qweekly, consider increased dose if remains elevated --restart home metoprolol 25mg  daily; switch to IV if necessary  Hypokalemia: Resolved  Dispo: Anticipated discharge in approximately 2-3 day(s).   Maryellen Pile, MD 12/21/2016, 3:34 PM Pager [phone]

## 2016-12-21 NOTE — Progress Notes (Signed)
Pt's daughter requesting pt to be on heart monitor. She feels like pt is not being monitored close enough. Made Dr. Gay Filler aware, and said that he will come up to the floor to see pt and talk to the family. No new orders at this time. Will continue to monitor pt.

## 2016-12-21 NOTE — Progress Notes (Signed)
Pt started to yell out again. BP=179/84, HR=133. Will continue to monitor pt.

## 2016-12-21 NOTE — Progress Notes (Signed)
Pt continues to be very confused and crying out.Pt hallucinating, hearing babies crying. Pt's daughter and granddaughter been trying to redirect pt without success. When this RN and Charge RN went in the room to help family member to calm pt down, pt becomes more agitated. Will continue to closely monitor pt.

## 2016-12-22 LAB — GLUCOSE, CAPILLARY
GLUCOSE-CAPILLARY: 134 mg/dL — AB (ref 65–99)
Glucose-Capillary: 109 mg/dL — ABNORMAL HIGH (ref 65–99)
Glucose-Capillary: 147 mg/dL — ABNORMAL HIGH (ref 65–99)
Glucose-Capillary: 170 mg/dL — ABNORMAL HIGH (ref 65–99)
Glucose-Capillary: 204 mg/dL — ABNORMAL HIGH (ref 65–99)

## 2016-12-22 LAB — BASIC METABOLIC PANEL
ANION GAP: 9 (ref 5–15)
BUN: 27 mg/dL — AB (ref 6–20)
CHLORIDE: 117 mmol/L — AB (ref 101–111)
CO2: 23 mmol/L (ref 22–32)
Calcium: 9.6 mg/dL (ref 8.9–10.3)
Creatinine, Ser: 1.24 mg/dL — ABNORMAL HIGH (ref 0.44–1.00)
GFR calc non Af Amer: 40 mL/min — ABNORMAL LOW (ref >60)
GFR, EST AFRICAN AMERICAN: 46 mL/min — AB (ref >60)
Glucose, Bld: 139 mg/dL — ABNORMAL HIGH (ref 65–99)
POTASSIUM: 3.6 mmol/L (ref 3.5–5.1)
SODIUM: 149 mmol/L — AB (ref 135–145)

## 2016-12-22 MED ORDER — DEXTROSE 5 % IV BOLUS
1000.0000 mL | Freq: Once | INTRAVENOUS | Status: AC
  Administered 2016-12-22: 1000 mL via INTRAVENOUS

## 2016-12-22 MED ORDER — ENSURE ENLIVE PO LIQD
237.0000 mL | Freq: Three times a day (TID) | ORAL | Status: DC
  Administered 2016-12-22 – 2016-12-31 (×20): 237 mL via ORAL

## 2016-12-22 NOTE — Progress Notes (Signed)
Pt.'s BP 125/122- MD notified. Per MD, try to give pt. Her oral morning meds again. If pt. Does not take oral meds, will call MD back.

## 2016-12-22 NOTE — Progress Notes (Signed)
Per MD, attempted to give pt. Her morning oral meds again, and she refused again. Her agitation increased as I continued to try to give her medicine. BP re-checked 167/113. MD paged.

## 2016-12-22 NOTE — Progress Notes (Signed)
Bladder scanned pt.-472 mL. MD paged.

## 2016-12-22 NOTE — Progress Notes (Signed)
   Subjective:  Patient lying in bed; she shakes her head "no" to having discomfort, chest pain, shortness of breath, headache. Family states that she had a quieter night without agitation or hallucinations. They states her PO intake is still down but she was able to drink an entire Ensure which is progress and she has been swallowing all of her PO meds as well.    Objective:  Vital signs in last 24 hours: Vitals:   12/21/16 1731 12/21/16 2021 12/21/16 2148 12/22/16 0414  BP: (!) 176/86 (!) 179/64  (!) 155/62  Pulse: (!) 107 87  99  Resp:  19  18  Temp:  98.1 F (36.7 C)  98 F (36.7 C)  TempSrc:  Axillary  Axillary  SpO2:  95% 98% 98%   Constitutional: lying in bed; does follow commands CV: RRR, systolic ejection murmur Resp: no increased work of breathing, CTAB Abd: soft, NDNT Msk: no calf tenderness or palpable chords Neuro: appears intact - not able to fully evaluate; some rigidity Psych: nonverbal this morning but does shake her head to answer some questions, masked faces.   Assessment/Plan:  Principal Problem:   Hypoglycemia Active Problems:   Hypothyroidism   Type 2 diabetes mellitus, controlled (HCC)   Hyperlipidemia   GERD   Coronary artery disease   Acute kidney injury superimposed on chronic kidney disease (HCC)   Hypertensive urgency   Malnutrition of moderate degree   MDD (major depressive disorder), recurrent severe, without psychosis ([place])  MDD with psychotic features, complicated by malnutrition: From IM standpoint, metabolic and infectious sources of her current state have been ruled out over the last hospitalizations. Patient evaluated by and discussed with Dr. Louretta Shorten with psychiatry who recommends inpatient [facility] admission when medically cleared. He also provided medication recommendations as he has on previous hospitalization - olanzapine 5mg  qhs for sleep and appetite, benztropine 0.5mg  BID and seroquel 25mg  BID for EPS and confusion. Psych consult  team will also evaluate patient today per family request for second opinion; they do agree with pursuing inpatient psych placement. Family is very weary of starting new medications but have agreed on olanzapine.  --Appreciate psych assistance and recommendations; to be seen by 2nd attending today --continue home imipramine 25mg  daily, olanzapine sl 5mg  --will avoid typical antipsychotics and benzodiazepines for acute confusion and agitation --Transfer to [facility] pending - social work on board- appreciate their help  Hypoglycemia: Patient had episode of hypoglycemia corrected with D50 overnight. Still having decreased PO intake but family states that she was able to drink an entire Ensure yesterday.  --f/u CBGs  AoCKD, stage 3: Improved from yesterday 1.36> 1.24 after 500cc NS bolus yesterday. Likely from poor PO intake.  CAD, HLD: --continue clopidogrel 75mg  daily --continue atorvastatin 20mg  daily  Hypothyroidism: --continue levothyroxine 64mcg daily  HTN: Patient continues to have hypertensive episodes that are worsened by her episodes of agitation. Her BP this AM was 155/62.  --continue clonidine 0.1mg  patch qweekly --home metoprolol 25mg  daily and valsartan (irbesartan 150mg  daily).  Dispo: Anticipated discharge in approximately 2-3 day(s).   Alphonzo Grieve, MD 12/22/2016, 10:18 AM Pager [phone]

## 2016-12-22 NOTE — Progress Notes (Signed)
Re-tried giving medications. Pt. Is slowly taking them. MD made aware. Pt. Also has not voided today. MD made aware of this also. Will continue to monitor and if pt. Has not voided in next couple of hours, will bladder scan and page MD with results.

## 2016-12-23 DIAGNOSIS — R339 Retention of urine, unspecified: Secondary | ICD-10-CM

## 2016-12-23 LAB — GLUCOSE, CAPILLARY
GLUCOSE-CAPILLARY: 159 mg/dL — AB (ref 65–99)
GLUCOSE-CAPILLARY: 162 mg/dL — AB (ref 65–99)
GLUCOSE-CAPILLARY: 179 mg/dL — AB (ref 65–99)
GLUCOSE-CAPILLARY: 190 mg/dL — AB (ref 65–99)
GLUCOSE-CAPILLARY: 80 mg/dL (ref 65–99)
Glucose-Capillary: 141 mg/dL — ABNORMAL HIGH (ref 65–99)
Glucose-Capillary: 185 mg/dL — ABNORMAL HIGH (ref 65–99)

## 2016-12-23 MED ORDER — DEXTROSE 5 % IV BOLUS
1000.0000 mL | Freq: Once | INTRAVENOUS | Status: AC
Start: 2016-12-23 — End: 2016-12-23
  Administered 2016-12-23: 1000 mL via INTRAVENOUS

## 2016-12-23 NOTE — Progress Notes (Signed)
   Subjective:  Patient alert and talking this morning. She expresses that she wants to go home; states she knows she's in the hospital and is oriented to her family. She still endorses auditory hallucinations and her appetite is still low.   Family states that she has not slept throughout the night, has been restless and still not eating very much.   Yesterday she was found to have urinary retention of about 500cc and she was in/out cathed. She denies abdominal pain or urinary symptoms today.   Objective:  Vital signs in last 24 hours: Vitals:   12/22/16 2034 12/22/16 2235 12/23/16 0300 12/23/16 0911  BP: (!) 167/76 132/64 (!) 133/52   Pulse: 75  98   Resp: 17  16   Temp: 98 F (36.7 C)  98.4 F (36.9 C)   TempSrc: Axillary  Oral   SpO2:   100% 98%   Constitutional: sitting up in bed, consoling daughter, following commands CV: RRR, systolic ejection murmur Resp: no increased work of breathing, CTAB Msk: no calf tenderness or palpable chords Neuro: mild rigidity,  Psych: answers questions today and follows commands, knows she's in the hospital. Masked faces. Endorses auditory hallucinations.  Assessment/Plan:  Principal Problem:   Hypoglycemia Active Problems:   Hypothyroidism   Type 2 diabetes mellitus, controlled (HCC)   Hyperlipidemia   GERD   Coronary artery disease   Acute kidney injury superimposed on chronic kidney disease (HCC)   Hypertensive urgency   Malnutrition of moderate degree   MDD (major depressive disorder), recurrent severe, without psychosis ([place])  MDD with psychotic features, complicated by malnutrition: Some improvement was seen today; patient appeared alert and oriented though still reports hallucinations, poor appetite and sleep.  Unfortunately, patient was not seen by 2nd psych attending this weekend. I spoke to Dr. Lenna Sciara by phone and he will try and arrange for this to happen today if possible. --Appreciate psych assistance and recommendations;  hopefully to be seen by 2nd attending today --continue home imipramine 25mg  daily, olanzapine sl 5mg ; will discuss with family again this afternoon about either increasing olanzapine or adding on Seroquel/Benztopine. --will avoid typical antipsychotics and benzodiazepines for acute confusion and agitation --Transfer to [facility] pending - social work on board- appreciate their help  Hypoglycemia: No further hypoglycemia yesterday. Still having poor PO intake. --f/u CBGs  Urinary retention: --periodic bladder scans if no urine output --in/out cath for now as necessary  AoCKD, stage 3: Will hold labs today as patient was getting more agitated. She received D5W bolus yesterday. --f/u AM BMP   CAD, HLD: --continue clopidogrel 75mg  daily --continue atorvastatin 20mg  daily  Hypothyroidism: --continue levothyroxine 19mcg daily  HTN: Better controlled since adding back her irbesartan and several attempts to administer PO meds. Her BP this AM was 133/52.  --continue clonidine 0.1mg  patch qweekly --home metoprolol 25mg  daily and valsartan (irbesartan 150mg  daily).  Dispo: Anticipated discharge in approximately 2-3 day(s); pending transfer to [facility].   Alphonzo Grieve, MD 12/23/2016, 11:29 AM Pager [phone]

## 2016-12-23 NOTE — Progress Notes (Signed)
I got page regarding patient that she was complaining of some burning sensation in her tongue. Later when went upstairs to evaluate her, she was sleeping, moan in response to some sternal rub, does not open her eyes. Daughter was concerned that she is like that with hypoglycemia. On examination vitals were stable, chest was clear, CBG was 179. She was not opening her mouth fully there was no obvious lesion on her tongue. -Most probably she was passed asleep as she was unable to sleep properly for last couple of days. -We will monitor her for any acute change. -Continue CBG every 4 hourly.

## 2016-12-23 NOTE — Progress Notes (Signed)
Pt unable to void from 7pm-12 midnight, tried to go to the bathroom, took a shower, ambulate but unsuccessful did bladder scan 221 ml paged on call Teaching Service, during day shift pt unable to void for 9 hours and did bladder scan  At 1846 with  472 ml and did in and out cath with 425 ml output, will continue to monitor.

## 2016-12-23 NOTE — Progress Notes (Signed)
Patient continue to required extensive care . Patient   po intake is very poor, very confused, family and staff continue to redirect her but still not cooperative. At [facility], staff assisted patient to the bathroom but patient still not voiding,bladder scan done and noted about 170-190 ml of urine in the bladder. Md paged and updated. No new monitor and to continue to monitor at this time.

## 2016-12-23 NOTE — Care Management Important Message (Signed)
Important Message  Patient Details  Name: Christine [name] MRN: [id number] Date of Birth: 01/24/1934   Medicare Important Message Given:  Yes    Bobbe Quilter 12/23/2016, 4:54 PM

## 2016-12-23 NOTE — Progress Notes (Signed)
Pt still unable to urinate even she tried to go to the bathroom but unsuccessful I did second bladder scan shows 291 ml and did the in and out cath as ordered if more than 250 ml, on call MD Dr. Gay Filler called and ordered D5W 1000 ml bolus, pt alert and confused, her daughter at the bedside, will continue to monitor.

## 2016-12-23 NOTE — Progress Notes (Signed)
Internal Medicine Attending:   I saw and examined the patient. I reviewed the resident's note and I agree with the resident's findings and plan as documented in the resident's note.  83 year old woman who is hospital day #5 for severe depression with psychotic features. She appears a little more interactive today, has some mild cogwheel stiffness in both arms that is increased since starting Olanzapine. Eating and drinking very little as evidenced by mild hypernatremia which we have corrected with IV free water yesterday.   I was very concerned by her daughter's behavior today. She was in the bed with the patient when we came in, holding the patient still. She said the patient was awake all night, that she seems like she is getting worse. She yelled at our team for several minutes, and continues to perseverate on the brief discontinuation of imipramine from the hospital stay three weeks ago. She continues to be reticent to allow [place] to escalate the antipsychotic medications, and is hypervigilant about any side effects. I have told her that antipsychotics are the only tool we have to manage her mother's declining functional status. I told the daughter that she cannot yell at our team, that it will cause her mother to become more agitated. We will talk with psych today about medication adjustments, the severity of her depression is beyond my scope to manage. Social work is trying to arrange for inpatient psychiatry care transfer.

## 2016-12-23 NOTE — Progress Notes (Signed)
Pt BP 167/76 at 2034 rechecked BP 132/64, pt alert and responsive, no s/s of distress noted.

## 2016-12-24 ENCOUNTER — Inpatient Hospital Stay (HOSPITAL_COMMUNITY): Payer: Medicare Other

## 2016-12-24 LAB — GLUCOSE, CAPILLARY
GLUCOSE-CAPILLARY: 151 mg/dL — AB (ref 65–99)
GLUCOSE-CAPILLARY: 225 mg/dL — AB (ref 65–99)
GLUCOSE-CAPILLARY: 117 mg/dL — AB (ref 65–99)
GLUCOSE-CAPILLARY: 185 mg/dL — AB (ref 65–99)
Glucose-Capillary: 113 mg/dL — ABNORMAL HIGH (ref 65–99)
Glucose-Capillary: 198 mg/dL — ABNORMAL HIGH (ref 65–99)

## 2016-12-24 LAB — BASIC METABOLIC PANEL
Anion gap: 12 (ref 5–15)
BUN: 17 mg/dL (ref 6–20)
CHLORIDE: 105 mmol/L (ref 101–111)
CO2: 24 mmol/L (ref 22–32)
CREATININE: 1.07 mg/dL — AB (ref 0.44–1.00)
Calcium: 9.5 mg/dL (ref 8.9–10.3)
GFR calc non Af Amer: 48 mL/min — ABNORMAL LOW (ref >60)
GFR, EST AFRICAN AMERICAN: 55 mL/min — AB (ref >60)
Glucose, Bld: 147 mg/dL — ABNORMAL HIGH (ref 65–99)
POTASSIUM: 3 mmol/L — AB (ref 3.5–5.1)
Sodium: 141 mmol/L (ref 135–145)

## 2016-12-24 MED ORDER — POTASSIUM CHLORIDE 20 MEQ PO PACK
40.0000 meq | PACK | Freq: Once | ORAL | Status: AC
  Administered 2016-12-24: 40 meq via ORAL
  Filled 2016-12-24: qty 2

## 2016-12-24 MED ORDER — IMIPRAMINE HCL 25 MG PO TABS
25.0000 mg | ORAL_TABLET | Freq: Once | ORAL | Status: AC
  Administered 2016-12-24: 25 mg via ORAL
  Filled 2016-12-24: qty 1

## 2016-12-24 MED ORDER — DEXTROSE 5 % AND 0.45 % NACL IV BOLUS
1000.0000 mL | Freq: Once | INTRAVENOUS | Status: AC
  Administered 2016-12-24: 1000 mL via INTRAVENOUS

## 2016-12-24 MED ORDER — CLONIDINE ORAL SUSPENSION 10 MCG/ML: 0.2000 mg | Freq: Three times a day (TID) | ORAL | Status: DC

## 2016-12-24 MED ORDER — POTASSIUM CHLORIDE 2 MEQ/ML IV SOLN
30.0000 meq | INTRAVENOUS | Status: AC
  Administered 2016-12-24: 30 meq via INTRAVENOUS
  Filled 2016-12-24 (×2): qty 15

## 2016-12-24 MED ORDER — CLONIDINE HCL 0.2 MG/24HR TD PTWK
0.2000 mg | MEDICATED_PATCH | TRANSDERMAL | Status: DC
  Filled 2016-12-24: qty 1

## 2016-12-24 MED ORDER — CLONIDINE HCL 0.2 MG PO TABS
0.2000 mg | ORAL_TABLET | Freq: Three times a day (TID) | ORAL | Status: DC
  Administered 2016-12-24: 0.2 mg via ORAL
  Filled 2016-12-24 (×2): qty 1

## 2016-12-24 MED ORDER — DEXTROSE 5 % AND 0.45 % NACL IV BOLUS: 1000.0000 mL | Freq: Once | INTRAVENOUS | Status: DC

## 2016-12-24 MED ORDER — OLANZAPINE 10 MG PO TBDP
10.0000 mg | ORAL_TABLET | Freq: Every day | ORAL | Status: DC
  Administered 2016-12-24: 10 mg via ORAL
  Filled 2016-12-24 (×3): qty 1

## 2016-12-24 MED ORDER — CLONIDINE HCL 0.1 MG PO TABS
0.2000 mg | ORAL_TABLET | Freq: Three times a day (TID) | ORAL | Status: DC
  Administered 2016-12-24: 0.2 mg via ORAL
  Filled 2016-12-24: qty 2

## 2016-12-24 NOTE — Clinical Social Work Note (Signed)
[facility] referral has been made to [facility] and [facility]. [place] has requsted updated labs. CSW updated MD. CSW will continue to follow.   Darden Dates, MSW, LCSW  Clinical Social Worker  [phone]

## 2016-12-24 NOTE — Progress Notes (Signed)
  Date: 12/24/2016  Patient name: Christine [name]  Medical record number: [id number]  Date of birth: 01/24/1934   I have seen and evaluated this patient and I have discussed the plan of care with the house staff. Please see their note for complete details. I concur with their findings with the following additions/corrections: Pt did interact with team briefly - answered she was OK and ID'd her daughter who was in room. She then became withdrawn and stopped interacting. Her issue is psychiatric and she requires specialized pysch care which we are attempting to obtain - inpt consult and transfer to inpt pysch. Her BP is transiently elevated - refusing to take meds consistently 2/2 mental illness. e  Bartholomew Crews, MD 12/24/2016, 2:58 PM

## 2016-12-24 NOTE — Progress Notes (Signed)
S: Paged by RN for AMS and decreased responsiveness.   O: On evaluation patient eyes are closed. She is laying in bed and does not appear to be in distress. She is not answering questions but is following simple commands. Vitals are stable. Pulse ox reading is unreliable (poor waveform). She is breathing comfortably on 2L [place], no increased work of breathing. Recent CXR (one hour ago) is unremarkable.   Today's Vitals   12/24/16 1248 12/24/16 1730 12/24/16 1750 12/24/16 1803  BP: 132/88 (!) 92/37 (!) 100/50 (!) 116/51  Pulse: 89 78 100 62  Resp:      Temp:      TempSrc:      SpO2:  100% (!) 86%   PainSc:       Physical Exam Constitutional: Laying in bed with her eyes closed, NAD, appears comfortable HEENT: Pupils constricted bilaterally, reactive to light  Cardiovascular: RRR, no murmurs, rubs, or gallops.  Pulmonary/Chest: CTAB, no wheezes, rales, or rhonchi.  Abdominal: Soft, non tender, non distended. +BS.   Neurological: Not responding to questions but follows some commands (squeezes fingers, says ah), Facial symmetry intact, no gross deficit Psychiatric: Unable to assess  EKG: Normal sinus rhythm, no ischemic changes  CXR: No active disease   A: Patient is an 83 yo F with pmhx of HTN, DM, and hypothyroidism admitted for MDD with psychotic features now with decreased responsiveness.   Plan: -- Head CT  -- NPO  -- Med list reviewed; no apparent contributing meds or recent medication changes  -- Labs reviewed; hypokalemia currently being replaced  -- Continue to monitor vitals  -- Transfer to tele   Velna Ochs, M.D. 12/24/2016, 6:39 PM   After 5 pm, please page: [phone]

## 2016-12-24 NOTE — Progress Notes (Addendum)
Christine [name] is in bed, sleeping. BP 92/37 P 100. Dr Jari Favre notified, IVF bolus ordered and started.  1740 [name] is not responsive, O2 sats down to 78% at room air. Rapid response team notified and came in. O2 started. 1750 BP 100/50 P 62, O2 sats 92% on 3 Li per nasal canula. [name] is still not responsive. 1800 Dr Genene Churn came in. Cardiac monitor placed, NSR. [name] partially opened her eyes from verbal stimuli. Nods head when asked. Stat CT-brain done, transferred [name] to [facility]. Report given to the charge nurse Neoma Laming. [name]'s family at the bedside.

## 2016-12-24 NOTE — Progress Notes (Signed)
Rounding on patient and family this afternoon. Spoke with daughter re: plan of care. Patient unable to engage and answer questions. Daughter comments on an anticipated second consult/opinion with psych services. Daughter comments she has already made medical team aware of this request. I spoke with Jari Favre, M.D. who confirmed a request has been made with psych service. Awaiting consultation. Also I spoke with Judson Roch CSW who will also attempt to follow-up with psych services re: 2nd consult. This update shared with patient's daughter. Edison Nasuti of Patient Experience department present for this conversation. Daughter visibly upset about no second consult as of yet. Keeps referring to the her request to speak with someone who can answer questions re: patient's current mental health changes and behavior. Attempt to offer support and assure daughter of desire to help. I re-stated the plan for follow-up on tomorrow with the M.D. And CSW. Edison Nasuti also offers additional resources to ensure questions answered. Multiple attempts to re-inforce what has been shared from M.D. Notes and the need for specialized psych care; who can best answer the questions re: "why patient is behaving this way" and what to expect going forward. Daughter is teary-eyed and continues to ask the same questions. Conversation ended with plan for hospital staff to follow up on tomorrow.

## 2016-12-24 NOTE — Progress Notes (Signed)
Attempted to administer patient 1000 am medications and she refused. Waited about 30 to 40 minutes and tried again but patient still refused. Will chart not given and pass report along to nurse at 11am.

## 2016-12-24 NOTE — Progress Notes (Signed)
   Subjective:  Patient talking with [place] this morning. She denies pain, discomfort, shortness of breath. Still has no appetite. She denies auditory hallucinations. She would not answer questions about passive SI.  Family states that she was able to sleep for a few hours last night and this morning. She is taking in some Ensure, but not much else. Family noted improvement of her mentation last night; she was aware she was in the hospital and stated, "I want to live, I don't know why I said I don't."   Family is agreeable to increasing olanzapine today.   Objective:  Vital signs in last 24 hours: Vitals:   12/23/16 1500 12/23/16 1937 12/23/16 2300 12/24/16 0455  BP: (!) 147/63 (!) 160/53 (!) 141/68 (!) 188/85  Pulse: 82 94 84 91  Resp:   16 16  Temp: 99.3 F (37.4 C) 98.5 F (36.9 C) 99.7 F (37.6 C) 97.5 F (36.4 C)  TempSrc: Oral Oral Oral Oral  SpO2: 95% 100% 99% 100%   Constitutional: lying in bed CV: RRR, systolic ejection murmur, pulses intact and regular Resp: no increased work of breathing, CTAB Msk: no calf tenderness or palpable chords Neuro: mild rigidity  Psych: answers questions today and follows commands. Masked faces.  Assessment/Plan:  Principal Problem:   Hypoglycemia Active Problems:   Hypothyroidism   Type 2 diabetes mellitus, controlled (HCC)   Hyperlipidemia   GERD   Coronary artery disease   Acute kidney injury superimposed on chronic kidney disease (HCC)   Hypertensive urgency   Malnutrition of moderate degree   MDD (major depressive disorder), recurrent severe, without psychosis ([place])  MDD with psychotic features, complicated by malnutrition: Patient talking with [place] this morning, states she has no pain, discomfort. Still low appetite, but has been able to sleep for a few hours last night and this morning. --continue home imipramine 25mg  daily --increase olanzapine 10mg  sl --will avoid typical antipsychotics and benzodiazepines for acute confusion  and agitation --Transfer to [facility] pending - social work on board- appreciate their help  Hypoglycemia: No further hypoglycemia. Still having poor PO intake. --f/u CBGs  Urinary retention: --periodic bladder scans if no urine output --in/out cath for now as necessary  AoCKD, stage 3: Cr 1.07 - improving. --f/u AM BMP   CAD, HLD: --continue clopidogrel 75mg  daily --continue atorvastatin 20mg  daily  Hypothyroidism: --continue levothyroxine 65mcg daily  HTN: Better controlled since adding back her irbesartan and several attempts to administer PO meds. Her BP this AM was 188/85 - unsure of how much PM meds she actually recieved.  --change to clonidine solution 0.2mg  TID --home metoprolol 25mg  daily --d/c irbesartan 150mg  daily  Hypokalemia: K of 3.0 today, 2/2 poor PO intake. Hypernatremia has resolved; Na 141 this AM. --2x 80mEq KCL in D5 runs --potassium packet 87mEq  Dispo: Anticipated discharge in approximately 2-3 day(s); pending transfer to [facility].   Alphonzo Grieve, MD 12/24/2016, 11:37 AM Pager [phone]

## 2016-12-25 LAB — COMPREHENSIVE METABOLIC PANEL
ALT: 25 U/L (ref 14–54)
ANION GAP: 8 (ref 5–15)
AST: 23 U/L (ref 15–41)
Albumin: 2.9 g/dL — ABNORMAL LOW (ref 3.5–5.0)
Alkaline Phosphatase: 89 U/L (ref 38–126)
BUN: 13 mg/dL (ref 6–20)
CO2: 21 mmol/L — ABNORMAL LOW (ref 22–32)
Calcium: 9.3 mg/dL (ref 8.9–10.3)
Chloride: 111 mmol/L (ref 101–111)
Creatinine, Ser: 1.05 mg/dL — ABNORMAL HIGH (ref 0.44–1.00)
GFR calc Af Amer: 57 mL/min — ABNORMAL LOW (ref >60)
GFR, EST NON AFRICAN AMERICAN: 49 mL/min — AB (ref >60)
GLUCOSE: 136 mg/dL — AB (ref 65–99)
POTASSIUM: 3.7 mmol/L (ref 3.5–5.1)
Sodium: 140 mmol/L (ref 135–145)
Total Bilirubin: 1.1 mg/dL (ref 0.3–1.2)
Total Protein: 5.2 g/dL — ABNORMAL LOW (ref 6.5–8.1)

## 2016-12-25 LAB — CBC
HCT: 33.5 % — ABNORMAL LOW (ref 36.0–46.0)
HEMATOCRIT: 28.2 % — AB (ref 36.0–46.0)
HEMOGLOBIN: 9.2 g/dL — AB (ref 12.0–15.0)
HEMOGLOBIN: 10.8 g/dL — AB (ref 12.0–15.0)
MCH: 29.5 pg (ref 26.0–34.0)
MCH: 29.3 pg (ref 26.0–34.0)
MCHC: 32.6 g/dL (ref 30.0–36.0)
MCHC: 32.2 g/dL (ref 30.0–36.0)
MCV: 90.4 fL (ref 78.0–100.0)
MCV: 90.8 fL (ref 78.0–100.0)
Platelets: 234 10*3/uL (ref 150–400)
Platelets: 289 10*3/uL (ref 150–400)
RBC: 3.12 MIL/uL — ABNORMAL LOW (ref 3.87–5.11)
RBC: 3.69 MIL/uL — AB (ref 3.87–5.11)
RDW: 14.6 % (ref 11.5–15.5)
RDW: 14.8 % (ref 11.5–15.5)
WBC: 5.7 10*3/uL (ref 4.0–10.5)
WBC: 8 10*3/uL (ref 4.0–10.5)

## 2016-12-25 LAB — GLUCOSE, CAPILLARY
GLUCOSE-CAPILLARY: 137 mg/dL — AB (ref 65–99)
GLUCOSE-CAPILLARY: 140 mg/dL — AB (ref 65–99)
GLUCOSE-CAPILLARY: 128 mg/dL — AB (ref 65–99)
GLUCOSE-CAPILLARY: 87 mg/dL (ref 65–99)
Glucose-Capillary: 146 mg/dL — ABNORMAL HIGH (ref 65–99)

## 2016-12-25 LAB — MAGNESIUM: Magnesium: 1.8 mg/dL (ref 1.7–2.4)

## 2016-12-25 LAB — TSH: TSH: 1.582 u[IU]/mL (ref 0.350–4.500)

## 2016-12-25 MED ORDER — OLANZAPINE 5 MG PO TBDP
2.5000 mg | ORAL_TABLET | Freq: Every day | ORAL | Status: DC
  Administered 2016-12-25 – 2016-12-27 (×3): 2.5 mg via ORAL
  Filled 2016-12-25 (×6): qty 0.5

## 2016-12-25 MED ORDER — IMIPRAMINE HCL 10 MG PO TABS
10.0000 mg | ORAL_TABLET | Freq: Two times a day (BID) | ORAL | Status: DC
  Administered 2016-12-25 – 2016-12-31 (×11): 10 mg via ORAL
  Filled 2016-12-25 (×13): qty 1

## 2016-12-25 MED ORDER — CLONIDINE HCL 0.2 MG/24HR TD PTWK
0.2000 mg | MEDICATED_PATCH | TRANSDERMAL | Status: DC
  Administered 2016-12-25: 0.2 mg via TRANSDERMAL
  Filled 2016-12-25: qty 1

## 2016-12-25 MED ORDER — THIAMINE HCL 100 MG/ML IJ SOLN
100.0000 mg | Freq: Every day | INTRAMUSCULAR | Status: DC
  Administered 2016-12-25: 100 mg via INTRAVENOUS
  Filled 2016-12-25: qty 2

## 2016-12-25 MED ORDER — KCL IN DEXTROSE-NACL 40-5-0.45 MEQ/L-%-% IV SOLN
Freq: Once | INTRAVENOUS | Status: AC
  Administered 2016-12-25: 22:00:00 via INTRAVENOUS
  Filled 2016-12-25: qty 1000

## 2016-12-25 NOTE — Consult Note (Signed)
[facility] Consult   Reason for Consult:  Altered Mental Status Referring Physician:  Dr. Lynnae January Patient Identification: Christine [name] MRN:  [id number] Principal Diagnosis: MDD (major depressive disorder), recurrent, severe, with psychosis ([place]) Diagnosis:   Patient Active Problem List   Diagnosis Date Noted  . Hypoglycemia [E16.2] 12/18/2016  . AKI (acute kidney injury) ([place]) [N17.9] 12/17/2016  . Bradykinesia [R25.8]   . Delirium [R41.0]   . Dysarthria [R47.1] 12/01/2016  . MDD (major depressive disorder), recurrent, severe, with psychosis ([place]) [F33.3] 11/30/2016  . Protein-calorie malnutrition, severe [E43] 11/30/2016  . Hypertensive urgency [I16.0] 11/27/2016  . Malnutrition of moderate degree [E44.0] 11/27/2016  . Appetite loss [R63.0]   . Constipation [K59.00]   . Hyponatremia [E87.1] 11/26/2016  . Elevated alkaline phosphatase level [R74.8] 09/18/2016  . Mixed incontinence urge and stress [N39.46] 05/09/2016  . Dizziness [R42] 04/11/2016  . Exertional dyspnea [R06.09] 09/11/2015  . Hypertensive retinopathy of both eyes, grade 2 [H35.033] 04/20/2015  . Lumbar spondylosis [M47.816] 06/16/2014  . Carpal tunnel syndrome [G56.00] 02/18/2014  . Right shoulder pain [M25.511] 02/02/2014  . Osteopenia [M85.80] 01/11/2013  . Routine adult health maintenance [Z00.00] 01/11/2013  . Acute kidney injury superimposed on chronic kidney disease ([place]) [N17.9, N18.9] 09/21/2012  . Recurrent cough [R05] 09/18/2012  . Diverticulosis [K57.90] 09/18/2012  . Allergic rhinitis [J30.9] 02/13/2012  . Carotid stenosis [I65.29] 09/25/2011  . Coronary artery disease [I25.10] 08/29/2011  . HEART MURMUR, SYSTOLIC [O84.1] 66/04/3015  . Enlargement of clavicle [M89.319] 03/30/2008  . Solitary pulmonary nodule [R91.1] 12/15/2006  . Hyperlipidemia [E78.5] 11/28/2006  . Hypothyroidism [E03.9] 09/04/2006  . Type 2 diabetes mellitus, controlled ([place]) [E11.9] 09/04/2006  . Migraine  [G43.909] 09/04/2006  . Essential hypertension [I10] 09/04/2006  . GERD [K21.9] 09/04/2006   Total Time spent with patient: 45 minutes  Subjective:   Christine [name] is a 83 y.o. female with a history of HTN, HLD, hypothyroidism, CAD, DM2, COPD, and depression who is presently admitted for AMS.  Reviewed Dr.  Thompson Caul recent neurology consult note which gives a detailed outline of the recent events.  She had a recent admission with a similar presentation 1/9-1/11 whereby she was found to be hyponatremic and with hypertensive urgency.  Dr. Parke Poisson and this writer visited with patient and family.  Per Dr. Thompson Caul note on 12/12/16 "Daughter reports the patient is really very functional and independent. She presented to the emergency department on 12/05/16 after developing nausea, vomiting, diarrhea and some agitation at home. Her daughter reports that symptoms seemed to start after she was taken off of imipramine (which she had been taking for depression for many years) during a previous admission from 1/12-1/15/18. However, looking through her chart, she had previously been hospital from 1/9-1/11/18 due to changes in mental status which were treated to hyponatremia and hypertensive urgency. She was discharged home and seemed to be doing well until 1/12 when she began to experience speech changes. There is some concern that imipramine may have been causing some of these adverse effects and so this was held. There is also concerned that this may represent affects of decompensated depression following the death of her sister 3 weeks earlier. She was seen by psychiatry who started her on Effexor and hydroxyzine 3 times a day. During that admission, 11/30/16, she had a reported episode of altered mental status and dysarthria which prompted neuro imaging with CT scan of the head and MRI scan of the brain which did not show any acute abnormality. She showed  fluctuating mental status with "nightly alternating episodes of  clarity and episodes of anxiety and distress with hyperreligious and guilty thoughts, perseverating on certain phrases, often with decreased responsiveness." Her Effexor dose is increased and she was felt to be stable for discharge home with family supervision with a prescription for Ativan when necessary to help her sleep at night.  She will return to the emergency department on 12/03/16 after episodes of agitation. The night of 1/15, she was given Atarax at 10 PM. About 30 minutes later she was yelling and "out of control," but calm down after she was given Ativan. She slept until 5 AM and she again woke up agitated and screaming. EMS was called and transported the patient to the emergency department. She was evaluated by telepsychiatry his diagnosis was major depressive disorder. They recommended outpatient treatment with discontinuation of Atarax. She was again sent home. She will return to the emergency department on the evening of 12/04/16. Family reported that she had done well until about 1700 on that day when she "became less talkative and not acting like herself." She was brought back to the ED and this time was admitted and has remained in the hospital since."  Patient was discharged on 1/26 and returned to the hospital on 1/31 with continued AMS, poor PO intake of fluids and food.  Patient now remains with AMS.  On my interaction with patient she is clearly responding to internal stimuli, she is poorly oriented and is not fully aware of her circumstances.  She displays waxing and waining attention, and at times during our interaction would mimic my movements.  Daughter is at bedside and able to provide collateral and participate in a discussion of care as outlined in A/P.  She reports that her mom has been agitated, confused, yelling nonsensical statements such as let me go, and sometimes sits and stares for hours on end.  She has other periods throughout the morning that she is very clearheaded and  seems to be herself.  The evenings are much more difficulty.  She has been very concerned about her mom's poor PO intake as well.    Past Psychiatric History: History of MDD controlled with imipramine x 35 years. No history of SI or psychiatric hospitalizations  Risk to Self: Is patient at risk for suicide?: No Risk to Others:   Prior Inpatient Therapy:   Prior Outpatient Therapy:    Past Medical History:  Past Medical History:  Diagnosis Date  . Anemia   . Anginal pain ([place])   . Arthritis    "back, arms, hips; hands" (11/30/2015)  . Benign hypertensive heart and kidney disease with diastolic CHF, NYHA class II and CKD stage III ([place])   . Blood transfusion 1972   "after daughter born, attempted to give me blood; couldn't give it cause my blood was cold" (09/23/2013)  . CAD (coronary artery disease)    a. LHC 08/23/11: dLM 40-50%, oRI 40%, oCFX 40%, oD1 70% (small and not amenable to PCI).  LM lesion did not appear to be flow limiting.  Medical rx was recommended;  b. Echo 08/23/11: mild LVH, EF 60-65%, grade 1 diast dysfxn, mild BAE, PASP 24;  c. 02/2012  Cath: LM 50d, LAD 50p, D1 70ost, RI 70p, RCA ok->Med Rx;  d. 01/2013 Cardiolite: EF 88, no ischemia/infarct.  . Carotid stenosis    a. dopplers 10/12:  0-39% bilat ICA;  b. 10/2012 U/S: 0-39% bilat, f/u 1 yr (10/2013).  . Chronic bronchitis   .  Depression   . Diverticulosis of colon (without mention of hemorrhage)   . GERD (gastroesophageal reflux disease)   . Heart murmur, systolic    2-D echo in December 2011 showed a normal EF with grade 1 diastolic dysfunction, trivial pulmonary regurgitation and mildly elevated PA pressure at 37 mmHg probably secondary to her COPD.dynamic obstruction-mid cavity obliteration;  b. 02/2012 Echo: EF 60-65%, mild LVH, PASP 45mHg.  .Marland KitchenHistory of stomach ulcers 1970's  . Hyperlipidemia   . Hypertension   . Hypothyroidism   . Migraines    a. Next atypical symptoms in the past. Patient was started on  Neurontin for possible neuropathic origin of her pain.  . [place]a [place]   . Shortness of breath on exertion    "just related to angina >1 yr ago" (09/23/2013)  . Sinus arrhythmia   . Type II diabetes mellitus ([place]    a. On oral hypoglycemic agents.    Past Surgical History:  Procedure Laterality Date  . CARDIAC CATHETERIZATION N/A 09/26/2015   Procedure: Right/Left Heart Cath and Coronary Angiography;  Surgeon: DLarey Dresser MD;  [facility];  Service: Cardiovascular;  Laterality: N/A;  . CATARACT EXTRACTION W/ INTRAOCULAR LENS  IMPLANT, BILATERAL Bilateral 1990's  . LEFT HEART CATHETERIZATION WITH CORONARY ANGIOGRAM N/A 03/16/2012   Procedure: LEFT HEART CATHETERIZATION WITH CORONARY ANGIOGRAM;  Surgeon: THillary Bow MD;  [facility];  Service: Cardiovascular;  Laterality: N/A;  . VAGINAL HYSTERECTOMY  1976   Family History:  Family History  Problem Relation Age of Onset  . Colon cancer Maternal Grandmother   . Heart attack Maternal Grandmother   . COPD Father   . Other Mother     died of unknown causes in her 269's Pt raised by grandmother.  . Pulmonary Hypertension Daughter    Family Psychiatric  History: None Social History:  History  Alcohol Use No    Comment: 11/29/2016 "Last drink 1967"     History  Drug Use No    Social History   Social History  . Marital status: Widowed    Spouse name: N/A  . Number of children: N/A  . Years of education: N/A   Social History Main Topics  . Smoking status: Never Smoker  . Smokeless tobacco: Never Used  . Alcohol use No     Comment: 11/29/2016 "Last drink 1967"  . Drug use: No  . Sexual activity: Not Currently   Other Topics Concern  . None   Social History Narrative   Never smoked[place]  Care for her daughter who has had a lung transplant. Retired dBuilding surveyor   Additional Social History:    Allergies:   Allergies  Allergen Reactions  .  Aspirin Swelling and Other (See Comments)    Angioedema  . Atarax [Hydroxyzine] Other (See Comments)    psychosis  . Nsaids Other (See Comments)    "interacts with heart medications"  . Codeine Nausea And Vomiting and Other (See Comments)    "makes me deathly sick" - can take with nausea medicine   . Haldol [Haloperidol] Other (See Comments)    EPS - rigidity and dystonic reaction, very sensitive  . Other Other (See Comments)    MSG  . Penicillins Diarrhea    "real bad" Has patient had a PCN reaction causing immediate rash, facial/tongue/throat swelling, SOB or lightheadedness with hypotension: Yes Has patient had a PCN reaction causing severe rash involving mucus membranes or  skin necrosis: No Has patient had a PCN reaction that required hospitalization No Has patient had a PCN reaction occurring within the last 10 years: Yes If all of the above answers are "NO", then may proceed with Cephalosporin use.     Labs:  Results for orders placed or performed during the hospital encounter of 12/18/16 (from the past 48 hour(s))  Glucose, capillary     Status: Abnormal   Collection Time: 12/23/16  7:35 PM  Result Value Ref Range   Glucose-Capillary 179 (H) 65 - 99 mg/dL  Glucose, capillary     Status: Abnormal   Collection Time: 12/23/16 11:30 PM  Result Value Ref Range   Glucose-Capillary 162 (H) 65 - 99 mg/dL  Glucose, capillary     Status: Abnormal   Collection Time: 12/24/16  4:53 AM  Result Value Ref Range   Glucose-Capillary 113 (H) 65 - 99 mg/dL  Basic metabolic panel     Status: Abnormal   Collection Time: 12/24/16  8:10 AM  Result Value Ref Range   Sodium 141 135 - 145 mmol/L   Potassium 3.0 (L) 3.5 - 5.1 mmol/L   Chloride 105 101 - 111 mmol/L   CO2 24 22 - 32 mmol/L   Glucose, Bld 147 (H) 65 - 99 mg/dL   BUN 17 6 - 20 mg/dL   Creatinine, Ser 1.07 (H) 0.44 - 1.00 mg/dL   Calcium 9.5 8.9 - 10.3 mg/dL   GFR calc non Af Amer 48 (L) >60 mL/min   GFR calc Af Amer 55 (L) >60  mL/min    Comment: (NOTE) The eGFR has been calculated using the CKD EPI equation. This calculation has not been validated in all clinical situations. eGFR's persistently <60 mL/min signify possible Chronic Kidney Disease.    Anion gap 12 5 - 15  Glucose, capillary     Status: Abnormal   Collection Time: 12/24/16  8:15 AM  Result Value Ref Range   Glucose-Capillary 151 (H) 65 - 99 mg/dL  Glucose, capillary     Status: Abnormal   Collection Time: 12/24/16 12:44 PM  Result Value Ref Range   Glucose-Capillary 225 (H) 65 - 99 mg/dL  Glucose, capillary     Status: Abnormal   Collection Time: 12/24/16  4:11 PM  Result Value Ref Range   Glucose-Capillary 198 (H) 65 - 99 mg/dL  Glucose, capillary     Status: Abnormal   Collection Time: 12/24/16  5:51 PM  Result Value Ref Range   Glucose-Capillary 117 (H) 65 - 99 mg/dL  Glucose, capillary     Status: Abnormal   Collection Time: 12/24/16  9:55 PM  Result Value Ref Range   Glucose-Capillary 185 (H) 65 - 99 mg/dL  Glucose, capillary     Status: Abnormal   Collection Time: 12/25/16 12:27 AM  Result Value Ref Range   Glucose-Capillary 146 (H) 65 - 99 mg/dL  Glucose, capillary     Status: Abnormal   Collection Time: 12/25/16  5:19 AM  Result Value Ref Range   Glucose-Capillary 137 (H) 65 - 99 mg/dL  CBC     Status: Abnormal   Collection Time: 12/25/16  8:29 AM  Result Value Ref Range   WBC 5.7 4.0 - 10.5 K/uL   RBC 3.12 (L) 3.87 - 5.11 MIL/uL   Hemoglobin 9.2 (L) 12.0 - 15.0 g/dL   HCT 28.2 (L) 36.0 - 46.0 %   MCV 90.4 78.0 - 100.0 fL   MCH 29.5 26.0 - 34.0 pg  MCHC 32.6 30.0 - 36.0 g/dL   RDW 14.6 11.5 - 15.5 %   Platelets 234 150 - 400 K/uL  Comprehensive metabolic panel     Status: Abnormal   Collection Time: 12/25/16  8:29 AM  Result Value Ref Range   Sodium 140 135 - 145 mmol/L   Potassium 3.7 3.5 - 5.1 mmol/L    Comment: DELTA CHECK NOTED NO VISIBLE HEMOLYSIS    Chloride 111 101 - 111 mmol/L   CO2 21 (L) 22 - 32  mmol/L   Glucose, Bld 136 (H) 65 - 99 mg/dL   BUN 13 6 - 20 mg/dL   Creatinine, Ser 1.05 (H) 0.44 - 1.00 mg/dL   Calcium 9.3 8.9 - 10.3 mg/dL   Total Protein 5.2 (L) 6.5 - 8.1 g/dL   Albumin 2.9 (L) 3.5 - 5.0 g/dL   AST 23 15 - 41 U/L   ALT 25 14 - 54 U/L   Alkaline Phosphatase 89 38 - 126 U/L   Total Bilirubin 1.1 0.3 - 1.2 mg/dL   GFR calc non Af Amer 49 (L) >60 mL/min   GFR calc Af Amer 57 (L) >60 mL/min    Comment: (NOTE) The eGFR has been calculated using the CKD EPI equation. This calculation has not been validated in all clinical situations. eGFR's persistently <60 mL/min signify possible Chronic Kidney Disease.    Anion gap 8 5 - 15  TSH     Status: None   Collection Time: 12/25/16  8:29 AM  Result Value Ref Range   TSH 1.582 0.350 - 4.500 uIU/mL    Comment: Performed by a 3rd Generation assay with a functional sensitivity of <=0.01 uIU/mL.  Glucose, capillary     Status: Abnormal   Collection Time: 12/25/16  8:29 AM  Result Value Ref Range   Glucose-Capillary 140 (H) 65 - 99 mg/dL  Magnesium     Status: None   Collection Time: 12/25/16  8:29 AM  Result Value Ref Range   Magnesium 1.8 1.7 - 2.4 mg/dL  CBC     Status: Abnormal   Collection Time: 12/25/16  1:56 PM  Result Value Ref Range   WBC 8.0 4.0 - 10.5 K/uL   RBC 3.69 (L) 3.87 - 5.11 MIL/uL   Hemoglobin 10.8 (L) 12.0 - 15.0 g/dL   HCT 33.5 (L) 36.0 - 46.0 %   MCV 90.8 78.0 - 100.0 fL   MCH 29.3 26.0 - 34.0 pg   MCHC 32.2 30.0 - 36.0 g/dL   RDW 14.8 11.5 - 15.5 %   Platelets 289 150 - 400 K/uL  Glucose, capillary     Status: Abnormal   Collection Time: 12/25/16  5:22 PM  Result Value Ref Range   Glucose-Capillary 128 (H) 65 - 99 mg/dL    Current Facility-Administered Medications  Medication Dose Route Frequency Provider Last Rate Last Dose  . atorvastatin (LIPITOR) tablet 20 mg  20 mg Oral QPM Rushil Sherrye Payor, MD   20 mg at 12/23/16 1732  . cloNIDine (CATAPRES - Dosed in mg/24 hr) patch 0.2 mg  0.2 mg  Transdermal Weekly Alphonzo Grieve, MD   0.2 mg at 12/25/16 1310  . clopidogrel (PLAVIX) tablet 75 mg  75 mg Oral QPM Rushil Sherrye Payor, MD   75 mg at 12/23/16 1732  . dextrose 5 % and 0.45 % NaCl with KCl 40 mEq/L infusion   Intravenous Once Alphonzo Grieve, MD      . enoxaparin (LOVENOX) injection 30 mg  30  mg Subcutaneous Q24H Alphonzo Grieve, MD   30 mg at 12/23/16 1732  . feeding supplement (ENSURE ENLIVE) (ENSURE ENLIVE) liquid 237 mL  237 mL Oral TID BM Alphonzo Grieve, MD   237 mL at 12/24/16 2022  . imipramine (TOFRANIL) tablet 25 mg  25 mg Oral BID Asencion Partridge, MD   25 mg at 12/25/16 1007  . levothyroxine (SYNTHROID, LEVOTHROID) tablet 25 mcg  25 mcg Oral QAC breakfast Riccardo Dubin, MD   25 mcg at 12/24/16 0818  . metoprolol succinate (TOPROL-XL) 24 hr tablet 25 mg  25 mg Oral QAC supper Alphonzo Grieve, MD   25 mg at 12/25/16 1749  . mometasone-formoterol (DULERA) 200-5 MCG/ACT inhaler 2 puff  2 puff Inhalation BID Axel Filler, MD   2 puff at 12/25/16 [phone]  . OLANZapine zydis (ZYPREXA) disintegrating tablet 10 mg  10 mg Oral QHS Alphonzo Grieve, MD   10 mg at 12/24/16 2330  . ondansetron (ZOFRAN) tablet 4 mg  4 mg Oral Q6H PRN Rushil Sherrye Payor, MD       Or  . ondansetron (ZOFRAN) injection 4 mg  4 mg Intravenous Q6H PRN Rushil Sherrye Payor, MD      . pantoprazole (PROTONIX) EC tablet 40 mg  40 mg Oral Daily Axel Filler, MD   40 mg at 12/23/16 1028  . polyvinyl alcohol (LIQUIFILM TEARS) 1.4 % ophthalmic solution 1 drop  1 drop Both Eyes QHS Axel Filler, MD   1 drop at 12/23/16 2306    Musculoskeletal: Strength & Muscle Tone: cogwheel Gait & Station: unsteady Patient leans: Front  Psychiatric Specialty Exam: Physical Exam  ROS  Blood pressure (!) 141/58, pulse 81, temperature 99.2 F (37.3 C), temperature source Axillary, resp. rate 20, height 5' 4"  (1.626 m), weight 50.7 kg (111 lb 12.4 oz), last menstrual period 02/11/1975, SpO2 100 %.Body mass index is 19.19  kg/m.  General Appearance: Disheveled  Eye Contact:  Minimal  Speech:  Slow and nonsensical, related to delusional material  Volume:  Decreased  Mood:  Anxious  Affect:  Congruent  Thought Process:  Disorganized  Orientation:  Negative  Thought Content:  Illogical, Delusions and Hallucinations: Visual  Suicidal Thoughts:  No  Homicidal Thoughts:  No  Memory:  NA  Judgement:  Impaired  Insight:  Lacking  Psychomotor Activity:  Slight grasp reflex, some eveidence of cogwheel rigidity, passive resistance of movements ([place]), automatic obedience/mirroring of left arm to right arm, generally signs of decrease frontal function  Concentration:  Concentration: Poor and Attention Span: Poor  Recall:  Poor  Fund of Knowledge:  Poor  Language:  Poor  Akathisia:  NA  Handed:  Right  AIMS (if indicated):     Assets:  Social Support  ADL's:  Impaired  Cognition:  Impaired,  Moderate, delirium  Sleep:   inconsistent sleep, day/nights mixed   Treatment Plan Summary: Miss Mcgonagle is an 83 year old female with multiple medical problems who is presently admitted with AMS.  This appears to be a multifactorial delirium with etiologies including recent hypertensive encephalopathy, hyponatremia, polypharmacy, and multiple hospital readmissions.  Multiple medications that had been introduced and/or adjusted through the course of her last 3 admissions this past month - including benztropine, seroquel, imipramine, zyprexa, ativan, haldol, sinement.   Her AMS does not appear to be mood driven at this time.  Her daughter conveys a history that suggests her mother was functioning quite independently prior to this episode, however I would be concerned  about some mild-moderate cognitive decline increasing Miss Hunnell's vulnerability to a protracted course of delirium.  Discussing with her daughter, it does not appear that Mrs. Kitchen has a pervasive history of SPMI or Severe MDD requiring prior hospitalizations.   I suspect that the grief of her sister's passing may be contributing to the manifestation of her confusion, but not likely the etiology.   Spent time discussing delirium with the patient's daughter and discussing how this can often present with waxing and waining forms of delirium including hyperactive, hypoactive, mixed delirium and delirium with catatonic features.  Spent time discussing the medical etiology of delirium, but that even once the medical issue is resolved, geriatric patients may take weeks to months to fully recover their cognition. We discussed a plan of care to reduce zyprexa to low doses, given that the encephalopathic brain is more sensitive to neuroleptic agents.  Further, we discussed a plan to taper imipramine given that the anticholinergic effects can certainly worsen confusion.  Would recommend to proceed as below.  # Delirium, mixed Recommend reduction of imipramine to 10 mg BID Recommend reducing Zyprexa to 2.5 mg QHS; may use 1.25 mg q8H prn for agitation or restlessness Please initiate Melatonin 3 mg qhs scheduled for sleep and circadian health Please obtain vitamin D levels, B12, Thiamine, if not already obtained, given poor nutritional status  # I'd suggest the following environmental precautions as below: Please make sure day, date, time, hospital, and location are present and visible to patient Please be sure to provide any needed eyewear, hearing aid or other assistive devices for patient to be able to interact with others Please keep the lights on during the day, and blinds open, to encourage appropriate day/night cycle Encourage patient to listen to familiar music, familiar TV shows, have some familiar comfort items at the bedside to reorient  Disposition: Referral for geriatric psychiaty unit pending acceptance; family is on board at this time  Aundra Dubin, MD 12/25/2016 6:15 PM

## 2016-12-25 NOTE — Consult Note (Signed)
Per Dr. Dwyane Dee, patient will be re-assessed by Dr. Parke Poisson his afternoon. Unit aware

## 2016-12-25 NOTE — Clinical Social Work Note (Signed)
CSW was notified by charge and RNCM that the family is requesting second opinion from psychiatry. Per RNCM multiple requests have been made for new consult. CSW contacted [facility] and spoke with [facility] Rory Percy who will try to see if we can get a different psychiatrist to see the patient. Randall Hiss shares that the current psychiatrist may be the only available psychiatrist for this campus, but will see if he can find another provider to assist. MD updated.  Liz Beach MSW, [place], [place], [id number]

## 2016-12-25 NOTE — Progress Notes (Signed)
  Date: 12/25/2016  Patient name: Christine [name]  Medical record number: [id number]  Date of birth: 01/24/1934   I have seen and evaluated this patient and I have discussed the plan of care with the house staff. Please see their note for complete details. I concur with their findings with the following additions/corrections: Christine [name] was seen on the morning rounds. She was sitting on the side of the with her daughter. The nurse had been trying to get her oral medication without much success. The patient was not interactive with the team today but was alert and able to maintain her position on the bed even while leaning forward to get something off the floor. We discussed the clinical events from yesterday. The physicians who assessed her during the incident were able to determine that the vital signs were inaccurate due to poor recordings and that unresponsiveness was an inaccurate term as she was following commands and was not significantly different from when we saw her the morning on rounds.Marland Kitchen Her workup did not find any concerning etiologies. Her mental health issues are beyond my capabilities. We have been limited in our assessment and treatment as psychiatry is not responding to the team's request to reevaluate the patient and provide treatment recommendations. We discussed this issue with the social worker on the floor and the patient's nurse and have been notified that a second psychiatrist will see her today. We are still waiting on a transfer to an inpatient psychiatric center but the facilities are still assessing her information.    Bartholomew Crews, MD 12/25/2016, 1:24 PM

## 2016-12-25 NOTE — Progress Notes (Signed)
Subjective:  Patient sitting up in bed getting her medicines - still refusing to swallow. Daughter states that she after taking the increased olanzapine last night, patient stated that she was hungry and had some Ensure before falling asleep and sleeping through the night. She spontaneously voided also. No acute events after her decreased responsiveness documented by Dr. Philipp Ovens last night. Her O2 sats have remained in the high 90's even off of oxygen; BP mildly hypertensive this morning.   Patient's Hgb this morning is down 3 points from baseline and prior with no active bleeding; likely lab error - will be repeating.   Objective:  Vital signs in last 24 hours: Vitals:   12/25/16 0513 12/25/16 0826 12/25/16 0843 12/25/16 0937  BP: (!) 161/73 (!) 141/58    Pulse: 87 81    Resp: 18 20    Temp: 98.8 F (37.1 C) 99.2 F (37.3 C)    TempSrc: Axillary Axillary    SpO2: 100% 99% 100%   Weight:    111 lb 12.4 oz (50.7 kg)  Height:    5\' 4"  (1.626 m)   Constitutional: lying in bed CV: RRR, systolic ejection murmur, pulses intact and regular Resp: no increased work of breathing, CTAB Msk: no calf tenderness or palpable chords Neuro: mild rigidity  Psych: Does not want to communicate today. Masked faces.  Assessment/Plan:  Principal Problem:   Hypoglycemia Active Problems:   Hypothyroidism   Type 2 diabetes mellitus, controlled (HCC)   Hyperlipidemia   GERD   Coronary artery disease   Acute kidney injury superimposed on chronic kidney disease (HCC)   Hypertensive urgency   Malnutrition of moderate degree   MDD (major depressive disorder), recurrent severe, without psychosis ([place])  MDD with psychotic features, complicated by malnutrition: Patient was increased on her olanzapine to 10mg  sl last night and was able to sleep throughout the night which is a great improvement. We are still working with the help of the unit charge nurse, Education officer, museum and [place] to obtain a second  opinion from psych. CT head yesterday with no acute changes.  --continue home imipramine 25mg  daily --olanzapine 10mg  sl --will avoid typical antipsychotics and benzodiazepines for acute confusion and agitation --Transfer to [facility] pending - social work on board- appreciate their help; workup they requested is almost complete - can be accessed through Chart review: CXR unremarkable, EKG SR without changes, CMP unremarkable, repeating CBC as inconsistent from priors. Still waiting for UA and UDS.  Hypoglycemia - resolved: No further hypoglycemia. Still having poor PO intake. --f/u CBGs  Episodes of Urinary retention: She was able to spontaneously urinate last night which is reassuring. We will continue to monitor and try and avoid foley as this might increase her restlessness and agitation. --periodic bladder scans if no urine output --in/out cath for now as necessary  AoCKD, stage 3: Cr 1.05 - improving. --f/u AM BMP   CAD, HLD: --continue clopidogrel 75mg  daily --continue atorvastatin 20mg  daily  Hypothyroidism: --continue levothyroxine 9mcg daily  HTN: BP 144/52 this AM. Still having trouble swallowing pills so we will switch back to a higher dose of clonidine patch as she responded well to the one dose of 0.2mg  PO clonidine yesterday. --change to clonidine patch 0.2mg  daily --home metoprolol 25mg  daily --d/c irbesartan 150mg  daily  Hypokalemia: K 3.7 today after repletion yesterday. Continue monitoring.   Dispo: Anticipated discharge in approximately 2-3 day(s); pending transfer to [facility].   Alphonzo Grieve, MD 12/25/2016, 10:48 AM Pager [phone]

## 2016-12-25 NOTE — Progress Notes (Signed)
Dr. Jari Favre notified patient continues to pull and grab at objects, unable to administer IV fluids at this time.  Sanda Linger

## 2016-12-25 NOTE — Progress Notes (Addendum)
I spoke with Dr. Sindy Messing from psychiatry this evening regarding Christine [name] medications and current mental status. He has thoroughly reviewed her case and had several recommendations. He believes there is a component of oversedation and polypharmacy in this 82 year old female which has led to her altered mental status secondary to high anticholinergic effects. He has made several recommendations which I will outline below and he will outline more formally in his note. For now we will decrease the patient's imipramine from 25 mg twice a day to 10 mg twice a day. Additionally, we will decrease her Zyprexa from 10 mg down to 1.25 mg. He also recommends repeating a urinalysis to ensure no infection has developed in the interval since her previous UA. On chart review I noticed where this had  been ordered but it appears her urine has not been collected. I will speak with nursing staff this evening to ensure her urine gets collected and that a urinalysis is performed appropriately. Also, psychiatry recommends a basic workup for potential vitamin deficiencies given the patient's significant malnutrition, weight loss and recurrent diarrhea. I'll add these labs. Lastly, they recommend starting thiamine which we will start this evening. -- Decrease imipramine from 25 mg twice a day to 10 mg twice a day -- Decrease Zyprexa from 10 mg to 2.5 mg, although psychiatry has recommended 1.25 mg it appears the smallest dose we are able to administer is 2.5 mg. I would recommend discussing with the pharmacy further tomorrow if a smaller dose is able to be given in the inpatient setting. -- Repeat urinalysis -- For sundowning or agitation overnight may use smallest doses of Ativan available. -- B12, folate, thiamine, vitamin D level

## 2016-12-26 LAB — GLUCOSE, CAPILLARY
GLUCOSE-CAPILLARY: 195 mg/dL — AB (ref 65–99)
GLUCOSE-CAPILLARY: 130 mg/dL — AB (ref 65–99)
GLUCOSE-CAPILLARY: 123 mg/dL — AB (ref 65–99)
Glucose-Capillary: 183 mg/dL — ABNORMAL HIGH (ref 65–99)
Glucose-Capillary: 165 mg/dL — ABNORMAL HIGH (ref 65–99)
Glucose-Capillary: 164 mg/dL — ABNORMAL HIGH (ref 65–99)

## 2016-12-26 LAB — URINALYSIS, MICROSCOPIC (REFLEX): Squamous Epithelial / LPF: NONE SEEN

## 2016-12-26 LAB — RAPID URINE DRUG SCREEN, HOSP PERFORMED
Amphetamines: NOT DETECTED
Barbiturates: NOT DETECTED
Benzodiazepines: NOT DETECTED
COCAINE: NOT DETECTED
OPIATES: NOT DETECTED
Tetrahydrocannabinol: NOT DETECTED

## 2016-12-26 LAB — CREATININE, SERUM
CREATININE: 1.13 mg/dL — AB (ref 0.44–1.00)
GFR calc non Af Amer: 45 mL/min — ABNORMAL LOW (ref >60)
GFR, EST AFRICAN AMERICAN: 52 mL/min — AB (ref >60)

## 2016-12-26 LAB — URINALYSIS, ROUTINE W REFLEX MICROSCOPIC
BILIRUBIN URINE: NEGATIVE
Glucose, UA: NEGATIVE mg/dL
KETONES UR: NEGATIVE mg/dL
Nitrite: NEGATIVE
PROTEIN: NEGATIVE mg/dL
Specific Gravity, Urine: >1.03 — ABNORMAL HIGH (ref 1.005–1.030)
pH: 5.5 (ref 5.0–8.0)

## 2016-12-26 LAB — VITAMIN B12: VITAMIN B 12: 1117 pg/mL — AB (ref 180–914)

## 2016-12-26 MED ORDER — VITAMIN B-1 100 MG PO TABS
100.0000 mg | ORAL_TABLET | Freq: Every day | ORAL | Status: DC
  Administered 2016-12-27 – 2016-12-31 (×5): 100 mg via ORAL
  Filled 2016-12-26 (×5): qty 1

## 2016-12-26 NOTE — Progress Notes (Signed)
   Subjective:  Patient is sitting on the side of the bed this morning, rocking back and forth. She is able to indicate that she does not have discomfort, chest pain, or abdominal pain.   Per daughter, patient was able to sleep throughout the night and drank an entire ensure for dinner. Family is in agreement with medication changes recommended by Dr. Daron Offer yesterday.   Objective:  Vital signs in last 24 hours: Vitals:   12/25/16 1800 12/25/16 2040 12/25/16 2104 12/26/16 0648  BP: (!) 155/89  (!) 146/89   Pulse: (!) 108  71   Resp: 18     Temp: 98.5 F (36.9 C)  98.6 F (37 C)   TempSrc: Oral  Oral   SpO2: 98% 97% 95%   Weight:    101 lb 6.4 oz (46 kg)  Height:       Constitutional: sitting up in bed, will not talk but does indicate yes/no to ROS CV: RRR, systolic ejection murmur, pulses intact and regular Resp: no increased work of breathing, CTAB Msk: no calf tenderness or palpable chords Neuro: unable to evaluate for rigidity  Psych: Does not want to communicate today. Masked faces.  Assessment/Plan:  Principal Problem:   MDD (major depressive disorder), recurrent, severe, with psychosis ([place]) Active Problems:   Hypothyroidism   Type 2 diabetes mellitus, controlled ([place])   Hyperlipidemia   GERD   Coronary artery disease   Acute kidney injury superimposed on chronic kidney disease ([place])   Hypertensive urgency   Malnutrition of moderate degree   Hypoglycemia  MDD with psychotic features, complicated by malnutrition and delirium: Patient was evaluated by Dr. Parke Poisson and Dr. Daron Offer with psychiatry yesterday - appreciate their assistance. Per their evaluation, patient's presentation complicated by delirium and polypharmacy. Of note, patient had only been on olanzapine in the last three days of this admission and had not been on benztropine or Seroquel at all throughout all admissions due to family resistance. Family is in agreement with current psych recs.  --imipramine 10mg   daily --olanzapine 2.5mg  sl with PRN olanzapine 5mg  sl --will avoid typical antipsychotics and benzodiazepines for acute confusion and agitation --Transfer to [facility] pending - social work on board- appreciate their help; workup they requested is almost complete - can be accessed through Chart review: CXR unremarkable, EKG SR without changes, CMP unremarkable, repeating CBC as inconsistent from priors. Still waiting for UA and UDS.  Hypoglycemia - resolved: No further hypoglycemia. Still having poor PO intake. --f/u CBGs  Episodes of Urinary retention: She was able to spontaneously urinate last night which is reassuring. We will continue to monitor and try and avoid foley as this might increase her restlessness and agitation. --periodic bladder scans if no urine output --in/out cath for now as necessary  AoCKD, stage 3: Stable. --f/u AM BMP   CAD, HLD: --continue clopidogrel 75mg  daily --continue atorvastatin 20mg  daily  Hypothyroidism: --continue levothyroxine 51mcg daily  HTN: No vitals recorded this AM due to agitation; will follow throughout the day. Still having trouble wanting to swallow pills --clonidine patch 0.2mg  daily --home metoprolol 25mg  daily  Hypokalemia: Repleted. --b/u AM BMP   Dispo: Anticipated discharge in approximately 2-3 day(s); pending transfer to [facility].   Alphonzo Grieve, MD 12/26/2016, 8:58 AM Pager [phone]

## 2016-12-26 NOTE — Progress Notes (Signed)
Pt refused to get her 2000 VS taken. Was getting agitated and was pushing away staff. Will try again later.

## 2016-12-26 NOTE — Progress Notes (Signed)
Initial Nutrition Assessment  DOCUMENTATION CODES:   Severe malnutrition in context of chronic illness, Underweight  INTERVENTION:    Ensure Enlive PO TID, each supplement provides 350 kcal and 20 grams of protein  NUTRITION DIAGNOSIS:   Malnutrition related to chronic illness as evidenced by severe depletion of body fat, severe depletion of muscle mass, percent weight loss (12% weight loss within 1 month).  GOAL:   Patient will meet greater than or equal to 90% of their needs  MONITOR:   PO intake, Supplement acceptance, Labs, I & O's, Weight trends  REASON FOR ASSESSMENT:   Malnutrition Screening Tool    ASSESSMENT:   83 year old mother of four with major depressive disorder complicated by moderate malnutrition, CAD, GERD, HLD, HTN, CKD Stage III who presented to the ED with altered mental status.  Patient's daughter and RN in room with patient during RD visit. They were trying to get patient to drink water.  Nutrition-Focused physical exam completed. Findings are severe fat depletion, severe muscle depletion, and no edema.  Patient has lost 12% of usual weight in the past month. Patient with severe PCM. She has been drinking some Ensure, encouraged patient to keep drinking supplements to maximize intake.  Diet Order:  Diet regular Room service appropriate? Yes; Fluid consistency: Thin; Fluid restriction: 1200 mL Fluid  Skin:  Reviewed, no issues  Last BM:  2/5  Height:   Ht Readings from Last 1 Encounters:  12/25/16 5\' 4"  (1.626 m)    Weight:   Wt Readings from Last 1 Encounters:  12/26/16 101 lb 6.4 oz (46 kg)    Ideal Body Weight:  54.5 kg  BMI:  Body mass index is 17.41 kg/m.  Estimated Nutritional Needs:   Kcal:  1500-1700  Protein:  70-80 gm  Fluid:  1.2 L  EDUCATION NEEDS:   No education needs identified at this time  Molli Barrows, [place], [place], [place] Pager [phone] After Hours Pager [phone]

## 2016-12-26 NOTE — Progress Notes (Signed)
Internal Medicine Attending:   I saw and examined the patient. I reviewed the resident's note and I agree with the resident's findings and plan as documented in the resident's note.  83 year old woman who is hospital day #8 with progressively declining functional status which is due to constellation of depression with psychotic features and probable delirium. We appreciate Dr. Joycelyn Schmid second opinion evaluation from psychiatry yesterday. I agree with the medication adjustments and they have been communicated with the family. We are working on transfer to [facility] inpatient psychiatry unit, as the patient remains too poorly functioning for discharge. She continues to have features of psychosis and requires dedicated geriatric psychiatry management.

## 2016-12-26 NOTE — Care Management Important Message (Signed)
Important Message  Patient Details  Name: Christine [name] MRN: [id number] Date of Birth: 01/24/1934   Medicare Important Message Given:  Yes    Aarit Kashuba [name] 12/26/2016, 11:03 AM

## 2016-12-26 NOTE — Clinical Social Work Note (Signed)
CSW has followed up with [place] and [facility]. Both facilities are requesting additional clinical information. CSW has sent over all requested clinical information and waits for a response from the facilities regarding whether patient can be admitted.  Liz Beach MSW, [place], [place], [id number]

## 2016-12-26 NOTE — Clinical Social Work Note (Signed)
Followup messages left with [place] and [facility] facilities requesting call back regarding referrals.    Liz Beach MSW, [place], [place], [id number]

## 2016-12-27 LAB — GLUCOSE, CAPILLARY
GLUCOSE-CAPILLARY: 142 mg/dL — AB (ref 65–99)
GLUCOSE-CAPILLARY: 153 mg/dL — AB (ref 65–99)
GLUCOSE-CAPILLARY: 167 mg/dL — AB (ref 65–99)
GLUCOSE-CAPILLARY: 224 mg/dL — AB (ref 65–99)
Glucose-Capillary: 175 mg/dL — ABNORMAL HIGH (ref 65–99)

## 2016-12-27 LAB — BASIC METABOLIC PANEL
Anion gap: 13 (ref 5–15)
BUN: 12 mg/dL (ref 6–20)
CHLORIDE: 104 mmol/L (ref 101–111)
CO2: 22 mmol/L (ref 22–32)
CREATININE: 1.16 mg/dL — AB (ref 0.44–1.00)
Calcium: 9.8 mg/dL (ref 8.9–10.3)
GFR calc non Af Amer: 43 mL/min — ABNORMAL LOW (ref >60)
GFR, EST AFRICAN AMERICAN: 50 mL/min — AB (ref >60)
Glucose, Bld: 149 mg/dL — ABNORMAL HIGH (ref 65–99)
POTASSIUM: 3.3 mmol/L — AB (ref 3.5–5.1)
SODIUM: 139 mmol/L (ref 135–145)

## 2016-12-27 LAB — FOLATE RBC
Folate, Hemolysate: 432.2 ng/mL
Folate, RBC: 1368 ng/mL (ref >498)
HEMATOCRIT: 31.6 % — AB (ref 34.0–46.6)

## 2016-12-27 LAB — VITAMIN D 25 HYDROXY (VIT D DEFICIENCY, FRACTURES): Vit D, 25-Hydroxy: 28.5 ng/mL — ABNORMAL LOW (ref 30.0–100.0)

## 2016-12-27 MED ORDER — POTASSIUM CHLORIDE 20 MEQ PO PACK
40.0000 meq | PACK | Freq: Every day | ORAL | Status: DC
  Filled 2016-12-27: qty 2

## 2016-12-27 MED ORDER — HYDRALAZINE HCL 20 MG/ML IJ SOLN: 5.0000 mg | Freq: Four times a day (QID) | INTRAMUSCULAR | Status: DC | PRN

## 2016-12-27 MED ORDER — METOPROLOL TARTRATE 5 MG/5ML IV SOLN
10.0000 mg | Freq: Once | INTRAVENOUS | Status: DC
  Administered 2016-12-27: 10 mg via INTRAVENOUS
  Filled 2016-12-27: qty 10

## 2016-12-27 MED ORDER — SODIUM CHLORIDE 0.9 % IV SOLN
30.0000 meq | INTRAVENOUS | Status: AC
  Administered 2016-12-27 (×2): 30 meq via INTRAVENOUS
  Filled 2016-12-27 (×2): qty 15

## 2016-12-27 MED ORDER — HYDRALAZINE HCL 10 MG PO TABS
10.0000 mg | ORAL_TABLET | Freq: Four times a day (QID) | ORAL | Status: DC | PRN
  Administered 2016-12-29 – 2016-12-30 (×2): 10 mg via ORAL
  Filled 2016-12-27 (×3): qty 1

## 2016-12-27 NOTE — Progress Notes (Signed)
   Subjective:  Patient sitting up in bed, alert, following conversation. She is able to speak with me a little this morning and she indicated that she had any discomfort, chest pain, shortness of breath or weakness. She thinks she may be able to eat a little more today.   On re-evaluation during rounds, son noted that she had eaten a several bites of breakfast and an Ensure. Family was updated on standing with admission for inpatient geri-psych. Son stated understanding of waxing/waning nature of patient's condition and remarked that she has intermittent episodes of agitation, confusion and reacting to internal stimuli dispersed between times of alertness and apparent awareness.   Objective:  Vital signs in last 24 hours: Vitals:   12/27/16 0542 12/27/16 0700 12/27/16 0815 12/27/16 1113  BP: (!) 231/98 (!) 131/45    Pulse: (!) 114 (!) 53    Resp:      Temp: 98.5 F (36.9 C)     TempSrc: Oral     SpO2: 98%  97% 97%  Weight: 104 lb 6.4 oz (47.4 kg)     Height:       Constitutional: sitting up in bed, talking with [place] a little today CV: RRR, systolic ejection murmur, pulses intact and regular Resp: no increased work of breathing, CTAB Msk: no calf tenderness or palpable chords Neuro: mild rigidity  Psych: Appears more alert today though still not able to fully participate in conversation.  Assessment/Plan:  Principal Problem:   MDD (major depressive disorder), recurrent, severe, with psychosis ([place]) Active Problems:   Hypothyroidism   Type 2 diabetes mellitus, controlled ([place])   Hyperlipidemia   GERD   Coronary artery disease   Acute kidney injury superimposed on chronic kidney disease ([place])   Hypertensive urgency   Malnutrition of moderate degree   Hypoglycemia  MDD with psychotic features, complicated by malnutrition and delirium: Patient doing better today; she was able to eat some food, seemed aware of her situation and surroundings.  --imipramine 10mg   daily --olanzapine 2.5mg  sl with PRN olanzapine 5mg  sl --will avoid typical antipsychotics and benzodiazepines for acute confusion and agitation --Transfer to [place] pending - social work on board - no bed today  Episodes of Urinary retention: Stable today. --periodic bladder scans if no urine output --in/out cath for now as necessary  AoCKD, stage 3: Stable. --f/u AM BMP   CAD, HLD: --continue clopidogrel 75mg  daily --continue atorvastatin 20mg  daily  Hypothyroidism: --continue levothyroxine 20mcg daily  HTN: No vitals recorded this AM due to agitation; will follow throughout the day. Still having trouble wanting to swallow pills --clonidine patch 0.2mg  daily --home metoprolol 25mg  daily  Hypokalemia: Repleted. --b/u AM BMP   Dispo: Anticipated discharge in approximately 2-3 day(s); pending transfer to [facility] [place] inpt [facility]psych.   Alphonzo Grieve, MD 12/27/2016, 3:16 PM Pager [phone]

## 2016-12-27 NOTE — Progress Notes (Signed)
Internal Medicine Attending:   I saw and examined the patient. I reviewed the resident's note and I agree with the resident's findings and plan as documented in the resident's note.  83 year old woman is hospital day #9 with severe depression with psychotic features. We've been making adjustments to her antipsychotic medications with the assistance of psychiatry. This morning she looks like she is making good progress. She was alert and interactive with [place]. She answered our questions. She followed commands. No signs of rigidity. No distractibility today, does not seem to have significant delirium at this time. Family tells [place] that she had some episodes of agitation last night but was reoriented. She continues to intermittently respond to internal stimuli. Oral intake is still not at goal. Given her persistent psychotic features and very low functional status she does still require inpatient psychiatry care and we are making progress on our referrals to [facility] and [facility] geripsych units.

## 2016-12-27 NOTE — Clinical Social Work Note (Signed)
CSW spoke with Shirlee Limerick in admission at [facility]. The facility does not have a bed for the patient today. New clinical information will have to be sent again tomorrow. CSW spoke with Kennyth Lose of [place] at [facility] in [place]. Kennyth Lose states she will not have a bed until Monday or Tuesday but will contact CSW on Monday to determine if they can accept the patient.   Liz Beach MSW, [place], [place], [id number]

## 2016-12-28 LAB — BASIC METABOLIC PANEL
ANION GAP: 11 (ref 5–15)
BUN: 10 mg/dL (ref 6–20)
CO2: 24 mmol/L (ref 22–32)
Calcium: 9.4 mg/dL (ref 8.9–10.3)
Chloride: 107 mmol/L (ref 101–111)
Creatinine, Ser: 1.05 mg/dL — ABNORMAL HIGH (ref 0.44–1.00)
GFR, EST AFRICAN AMERICAN: 57 mL/min — AB (ref >60)
GFR, EST NON AFRICAN AMERICAN: 49 mL/min — AB (ref >60)
Glucose, Bld: 139 mg/dL — ABNORMAL HIGH (ref 65–99)
POTASSIUM: 4 mmol/L (ref 3.5–5.1)
SODIUM: 142 mmol/L (ref 135–145)

## 2016-12-28 LAB — GLUCOSE, CAPILLARY
GLUCOSE-CAPILLARY: 147 mg/dL — AB (ref 65–99)
GLUCOSE-CAPILLARY: 258 mg/dL — AB (ref 65–99)
Glucose-Capillary: 139 mg/dL — ABNORMAL HIGH (ref 65–99)
Glucose-Capillary: 178 mg/dL — ABNORMAL HIGH (ref 65–99)
Glucose-Capillary: 128 mg/dL — ABNORMAL HIGH (ref 65–99)

## 2016-12-28 MED ORDER — OLANZAPINE 5 MG PO TBDP: 2.5000 mg | ORAL_TABLET | Freq: Every day | ORAL | 0 refills | Status: DC

## 2016-12-28 MED ORDER — CLONIDINE HCL 0.2 MG/24HR TD PTWK: 0.2000 mg | MEDICATED_PATCH | TRANSDERMAL | 12 refills | Status: DC

## 2016-12-28 MED ORDER — OLANZAPINE 5 MG PO TBDP
2.5000 mg | ORAL_TABLET | Freq: Every day | ORAL | Status: DC
  Administered 2016-12-28 – 2016-12-30 (×3): 2.5 mg via ORAL
  Filled 2016-12-28 (×5): qty 0.5

## 2016-12-28 MED ORDER — OLANZAPINE 5 MG PO TBDP: 1.2500 mg | ORAL_TABLET | Freq: Every day | ORAL | Status: DC

## 2016-12-28 MED ORDER — OLANZAPINE 2.5 MG PO TABS: 1.2500 mg | ORAL_TABLET | Freq: Every day | ORAL | Status: DC

## 2016-12-28 MED ORDER — IMIPRAMINE HCL 10 MG PO TABS: 10.0000 mg | ORAL_TABLET | Freq: Two times a day (BID) | ORAL | 0 refills | Status: DC

## 2016-12-28 MED ORDER — ACETAMINOPHEN 325 MG PO TABS
650.0000 mg | ORAL_TABLET | Freq: Four times a day (QID) | ORAL | Status: DC | PRN
  Filled 2016-12-28: qty 2

## 2016-12-28 NOTE — Progress Notes (Signed)
Patient's daughter insistent that Psych consult done on 2/7 that Dr. Sindy Messing recommended patient receive Zyprexa 1.25mg  at bedtime and she is requesting that we verify if this dose can be obtained.  Psych recommendations are spelled out in Dr. Elta Guadeloupe note on 12/25/2016 at [place].  Patient is receiving Zyprexa 2.5mg  per medication report.  I spoke with Pharmacist Tillie Rung and verified the Hospital dose carry a 2.5 mg tablet that can be broken in half to obtain the 1.25mg  dose.  Dr. Charlynn Grimes paged and made aware.  Family is requesting the lower dose of Zyprexa since it is available here in the hospital.  Sanda Linger

## 2016-12-28 NOTE — Discharge Summary (Signed)
Name: Christine [name] MRN: [id number] DOB: 01/24/1934 83 y.o. PCP: Christine Falcon, MD  Date of Admission: 12/18/2016  8:09 AM Date of Discharge: 12/31/2016 Attending Physician: Christine Brothers, MD  Discharge Diagnosis: Principal Problem:   MDD (major depressive disorder), recurrent, severe, with psychosis ([place]) Active Problems:   Hypothyroidism   Type 2 diabetes mellitus, controlled ([place])   Hyperlipidemia   GERD   Coronary artery disease   Acute kidney injury superimposed on chronic kidney disease ([place])   Hypertensive urgency   Malnutrition of moderate degree   Hypoglycemia   Discharge Medications: Allergies as of 12/31/2016      Reactions   Aspirin Swelling, Other (See Comments)   Angioedema   Atarax [hydroxyzine] Other (See Comments)   psychosis   Nsaids Other (See Comments)   "interacts with heart medications"   Codeine Nausea And Vomiting, Other (See Comments)   "makes me deathly sick" - can take with nausea medicine    Haldol [haloperidol] Other (See Comments)   EPS - rigidity and dystonic reaction, very sensitive   Other Other (See Comments)   MSG   Penicillins Diarrhea   "real bad" Has patient had a PCN reaction causing immediate rash, facial/tongue/throat swelling, SOB or lightheadedness with hypotension: Yes Has patient had a PCN reaction causing severe rash involving mucus membranes or skin necrosis: No Has patient had a PCN reaction that required hospitalization No Has patient had a PCN reaction occurring within the last 10 years: Yes If all of the above answers are "NO", then may proceed with Cephalosporin use.      Medication List    STOP taking these medications   cloNIDine 0.1 mg/24hr patch Commonly known as:  CATAPRES - Dosed in mg/24 hr Replaced by:  cloNIDine 0.3 mg/24hr patch   fluticasone 50 MCG/ACT nasal spray Commonly known as:  FLONASE   glimepiride 1 MG tablet Commonly known as:  AMARYL   loratadine 10 MG tablet Commonly known as:   CLARITIN   metoprolol succinate 25 MG 24 hr tablet Commonly known as:  TOPROL XL   sodium chloride 1 g tablet   valsartan 160 MG tablet Commonly known as:  DIOVAN     TAKE these medications   acetaminophen 500 MG tablet Commonly known as:  TYLENOL Take 1,000 mg by mouth every 6 (six) hours as needed for headache. For pain   ADVAIR DISKUS 250-50 MCG/DOSE Aepb Generic drug:  Fluticasone-Salmeterol inhale 1 dose by mouth every 12 hours   amLODipine 10 MG tablet Commonly known as:  NORVASC Take 1 tablet (10 mg total) by mouth daily. Start taking on:  01/01/2017   atorvastatin 40 MG tablet Commonly known as:  LIPITOR Take 0.5 tablets (20 mg total) by mouth every evening.   cloNIDine 0.3 mg/24hr patch Commonly known as:  CATAPRES - Dosed in mg/24 hr Place 1 patch (0.3 mg total) onto the skin once a week. On Monday. Start taking on:  01/07/2017 Replaces:  cloNIDine 0.1 mg/24hr patch   clopidogrel 75 MG tablet Commonly known as:  PLAVIX take 1 tablet by mouth every evening   esomeprazole 40 MG capsule Commonly known as:  NEXIUM take 1 capsule by mouth once daily BEFORE BREAKFAST   feeding supplement (GLUCERNA SHAKE) Liqd Take 237 mLs by mouth 3 (three) times daily between meals.   glucose blood test strip Use as instructed   hydroxypropyl methylcellulose / hypromellose 2.5 % ophthalmic solution Commonly known as:  ISOPTO TEARS / GONIOVISC Place 1 drop into  both eyes at bedtime.   imipramine 10 MG tablet Commonly known as:  TOFRANIL Take 1 tablet (10 mg total) by mouth 2 (two) times daily. What changed:  medication strength  how much to take   levothyroxine 25 MCG tablet Commonly known as:  SYNTHROID, LEVOTHROID take 1 tablet by mouth once daily   multivitamin with minerals Tabs tablet Take 1 tablet by mouth daily.   NITROSTAT 0.4 MG SL tablet Generic drug:  nitroGLYCERIN place 1 tablet under the tongue if needed every 5 minutes for chest pain for 3 doses  IF NO RELIEF AFTER 3RD DOSE CALL PRESCRIBER OR 911.   OLANZapine zydis 5 MG disintegrating tablet Commonly known as:  ZYPREXA Take 0.5 tablets (2.5 mg total) by mouth at bedtime.   polycarbophil 625 MG tablet Commonly known as:  FIBERCON Take 1 tablet (625 mg total) by mouth daily.   polyethylene glycol packet Commonly known as:  MIRALAX / GLYCOLAX Take 17 g by mouth daily as needed.   senna 8.6 MG Tabs tablet Commonly known as:  SENOKOT Take 1 tablet (8.6 mg total) by mouth at bedtime as needed for mild constipation.   VITAMIN C PO Take 1 tablet by mouth daily.       Disposition and follow-up:   ChristineChristine [name] was discharged from [facility] in Stable condition.  At the hospital follow up visit please address:  1. Severe Depression with Psychotic Features: withdrawn and limited interaction. On Zyprexa 2.5mg  sl and imipramine 10mg  BID. Patient to follow up with Dr. Casimiro Needle outpatient for medication adjustment.  --how is her PO intake? --any episodes of agitation? Hallucinations? Is she interacting more? --still having trouble with time awareness?   DM: Glimepiride was held at discharge in setting of inconsistent PO intake stable CBG's. Please reassess at visit for restarting.  HTN: D/c'd a lot of her meds due to patient unwillingness to swallow pills intermittently. Now on 0.3mg  clonidine patch qMonday and amlodipine 10mg  daily. Please adjust as necessary.  2.  Labs / imaging needed at time of follow-up: [facility]Met, CBG  3.  Pending labs/ test needing follow-up: None  Hospital Course by problem list:  Severe Depression with Psychotic Features: Christine [name] has been admitted several times over the past month for worsening functional status at home. She has a long history of major depression that had been well controlled with imipramine. However, over the past month she has had worsening symptoms following the death of her sister with whom she was very close. Her  daughter has been convinced that her decline was due to discontinuation of her imipramine during previous hospitalization and has been resistant to any medication changes. She has had a through medical work up including CT head, MRI brain, infectious workup, endocrine work up has all been unrevealing for an underlying disease process. She has been continuing to decline at home, eating and drinking very little and requiring coaxing to take any of her medications. Upon presentation this hospitalization, she was completely withdrawn and not responding to medical staff or her family. This was significantly worsen that when last seen 2 weeks prior. Initially had psychiatry consult from Dr. Arbutus Ped who the family had seen during previous hospitalizations and were not inclined to meet with him. At that time recommended starting Zyprexa which the family agreed to try. They requested a second opinion and we were able to have Dr. Daron Offer evaluate her who recommended decreasing the Zyprexa dose to 2.5 mg as well as decrease  the imipramine dose. Both agreed to referral for geriatric psychiatry for treatment. She has been medically stable and ready for transfer to inpatient psychiatric facility; patient was offered bed at one facility, however family declined due to poor reviews. By this time patient was more stable and family felt they would be able to adequately care for her at home with close outpatient follow up. She is to follow up with Dr. Casimiro Needle (outpatient geripsych) for adjustment of her medication.   Hypoglycemia: Initially hypoglycemia on admission. She was continuing to take her glimepiride in the setting of poor PO intake. Resolved after treatment. Discontinued glimepiride. Please consider restarting if she and her family report a consistent PO intake.   HTN: Blood pressure have been labile throughout hospitalization party from patient refusing PO meds and partly from agitation elicited by vitals checks.  Improved on clonidine 0.2 mg patch but difficult to obtain vitals during agitation. Unable to initiate any oral medications as patient is intermittently swallowing pills. She was placed on clonidine 0.3 mg patch and amlodipine 10mg  daily at discharge.   Pertinent labs: CBC Latest Ref Rng & Units 12/30/2016 12/25/2016 12/25/2016  WBC 4.0 - 10.5 K/uL 5.6 - 8.0  Hemoglobin 12.0 - 15.0 g/dL 11.0(L) - 10.8(L)  Hematocrit 36.0 - 46.0 % 33.4(L) 31.6(L) 33.5(L)  Platelets 150 - 400 K/uL 272 - 289   BMP Latest Ref Rng & Units 12/30/2016 12/28/2016 12/27/2016  Glucose 65 - 99 mg/dL 192(H) 139(H) 149(H)  BUN 6 - 20 mg/dL 13 10 12   Creatinine 0.44 - 1.00 mg/dL 1.08(H) 1.05(H) 1.16(H)  BUN/Creat Ratio 12 - 28 - - -  Sodium 135 - 145 mmol/L 139 142 139  Potassium 3.5 - 5.1 mmol/L 4.1 4.0 3.3(L)  Chloride 101 - 111 mmol/L 103 107 104  CO2 22 - 32 mmol/L 23 24 22   Calcium 8.9 - 10.3 mg/dL 9.7 9.4 9.8   Folate 1368 TSH 1.582 B12 1117  CT head 12/24/2016:  No acute changes; chronic microvascular disease.  Discharge Vitals:   BP (!) 179/97 (BP Location: Right Arm)   Pulse 90   Temp 98.5 F (36.9 C) (Oral)   Resp (!) 22   Ht 5\' 4"  (1.626 m)   Wt 104 lb 6.4 oz (47.4 kg)   LMP 02/11/1975   SpO2 99%   BMI 17.92 kg/m    Signed: Alphonzo Grieve, MD 12/31/2016, 4:40 PM   Pager: [phone]

## 2016-12-28 NOTE — Clinical Social Work Note (Signed)
CSW FU with [facility], spoke to [facility], who reports bed available today but needs pt chest X-ray and EKG.  CSW faxed requested clinical docs to facility with CSW's contact information.  CSW will continue to follow.  Crystin Lechtenberg B. Joline Maxcy Clinical Social Work Dept Weekend Social Worker [phone] 10:36 AM

## 2016-12-28 NOTE — Progress Notes (Signed)
Subjective: Patient still waxing and waning. Per report from daughter she did find until she was woken up last night and was very confused and agitated. Upon waking this morning she was still agitated and says she ws remembering things 'way back.' Cooperative with exam and following commands, however, not answering questions.   There was a question of her medications and when Dr. Sharyon Medicus had evaluated her on 2/7 he had recommended decreased the Zyprexa dose to as low as possible. Per his note he recommended 2.5 mg qhs and 1.25 mg q8hr prn agitation/restlessness. Nursing staff reviewed her medications with the daughter at bedside and there was a question as to why she was on the 2.5 mg dose and not the 1.25 mg dose. Nursing staff called the pharmacy and was told they could do the 1.25 mg dose if needed. Daughter became very agitated and upset about this and requested to speak with me outside the patient's room. Per her understanding, Dr. Daron Offer told her we were oversedating her and said to use the lowest dose possible. Asked 'who are you to override that decision and give her 2.5 mg.' Was yelling and screaming and I asked her several times to calm down. She kept reiterating that 'her mom is not a [place] pig for [place] to experiment on.'  Upon speaking with the pharmacy the lowest dose available to give for the dissolving tablet of Zyprexa. Patient has been intermittent taking PO medications but has been difficult and will retain them in her mouth. The pill formulation can be cut and crushed to give the 1.25 mg dose. Moreover, psych had been recommending 2.5 mg dose and the 1.25 mg dose as needed and the daughter misunderstood the recommendations.   Objective: Vital signs in last 24 hours: Vitals:   12/27/16 1551 12/27/16 1845 12/27/16 2100 12/28/16 0500  BP: 134/61  (!) 133/38 (!) 184/47  Pulse: 71  86 69  Resp: 20  18 18   Temp: 98.9 F (37.2 C)  98.4 F (36.9 C) 98.2 F (36.8 C)  TempSrc: Oral  Oral  Oral  SpO2: 100% 100% 100% 100%  Weight:      Height:       Weight change:   Intake/Output Summary (Last 24 hours) at 12/28/16 1202 Last data filed at 12/27/16 1800  Gross per 24 hour  Intake              240 ml  Output                0 ml  Net              240 ml    Physical Exam  Constitutional:  Thin, frail elderly woman resting comfortably in bed in no acute distress. Following commands but not answering most questions. Will answer yes or no.   Cardiovascular: Normal rate and regular rhythm.   Murmur heard. Pulmonary/Chest: Effort normal and breath sounds normal.  Skin: Skin is warm and dry.  Psychiatric:  Minimally interactive and withdrawn.     Medications: I have reviewed the patient's current medications. Scheduled Meds: . atorvastatin  20 mg Oral QPM  . cloNIDine  0.2 mg Transdermal Weekly  . clopidogrel  75 mg Oral QPM  . enoxaparin (LOVENOX) injection  30 mg Subcutaneous Q24H  . feeding supplement (ENSURE ENLIVE)  237 mL Oral TID BM  . imipramine  10 mg Oral BID  . levothyroxine  25 mcg Oral QAC breakfast  . metoprolol succinate  25  mg Oral QAC supper  . mometasone-formoterol  2 puff Inhalation BID  . OLANZapine  1.25 mg Oral QHS  . pantoprazole  40 mg Oral Daily  . polyvinyl alcohol  1 drop Both Eyes QHS  . thiamine  100 mg Oral Daily   Continuous Infusions: PRN Meds:.hydrALAZINE **OR** hydrALAZINE, ondansetron **OR** ondansetron (ZOFRAN) IV Assessment/Plan:  MDD with psychotic features, complicated by malnutrition and delirium: Patient still with waxing and waning symptoms. Continued issues with patient's daughter over her care and wants to blame someone over her status. Still waiting on Inpatient Psych transfer. Workup for underlying medical process has been unremarkable and this is a primary psychiatric issue that would be best treating at an inpatient psychiatric facility.  --imipramine 10mg  daily --olanzapine 2.5mg  sl with PRN olanzapine 5mg   sl --will avoid typical antipsychotics and benzodiazepines for acute confusion and agitation --Transfer to [facility] pending - social work on board- appreciate their help; possible bed placement today  Episodes of Urinary retention: Stable today. --periodic bladder scans if no urine output --in/out cath for now as necessary  AoCKD, stage 3: Stable. Cr 1.05 today.   CAD, HLD: --continue clopidogrel 75mg  daily --continue atorvastatin 20mg  daily  Hypothyroidism: --continue levothyroxine 77mcg daily  HTN: BP labile. Elevated during episodes of agitation and improves after rest.  --clonidine patch 0.2mg  daily --home metoprolol 25mg  daily  Hypokalemia: Resolved. K 4.0 today.   Dispo: Transfer to IP psych when bed available  The patient does have a current PCP [name], MD) and does need an [facility] hospital follow-up appointment after discharge.  The patient does not have transportation limitations that hinder transportation to clinic appointments.  .Services Needed at time of discharge: Y = Yes, Blank = No PT:   OT:   RN:   Equipment:   Other:     LOS: 9 days   Maryellen Pile, MD IMTS PGY-1 [phone] 12/28/2016, 12:02 PM

## 2016-12-28 NOTE — Progress Notes (Signed)
Updated daughter on [facility] declining admission. She requested to speak with our social worker to document names and dates and be updated on the other facility that we are waiting to hear back on bed availability; possibly Tuesday per social work.   Discussed her mother's Zyprexa dose and the psych recommendations. Per Dr. Daron Offer 's note on 2/7, he recommended Zyprexa 2.5 mg qhs and 1.25 mg q8hr prn agitation. Dr. Tanna Furry note from 2/7 indications conversation with Dr. Sindy Messing who recommended the lowest dose possible. Informed daughter that the lowest dose of the dissolving tablet we have available is the 2.5 mg dose. The pill formulation can be halved and given at 1.25 mg dose but given her intermittent compliance with pills, would like to continue the dissolving formulation. Daughter appeared agreeable to this at this time.

## 2016-12-28 NOTE — Progress Notes (Signed)
  Date: 12/28/2016  Patient name: Christine [name]  Medical record number: [id number]  Date of birth: 01/24/1934   This patient's plan of care was discussed with the house staff. Please see their note for complete details. I concur with their findings.  I was called by nurse manager to discuss case tonight.  There was concern expressed by family about the Zyprexa dosing.  I explained this to management Gerald Stabs) and Dr. Charlynn Grimes documented this very clearly in his note about the dosing of the Zyprexa. Gerald Stabs asked if we could have a family meeting with Ms. Willers daughter tomorrow and I am happy to do so when it can be arranged.     [name], MD 12/28/2016, 8:25 PM

## 2016-12-28 NOTE — Progress Notes (Signed)
Spoke with social work this afternoon. Christine [name] [facility] denied the patient due to her being 'too acute.' Still pending response from one other facility that may have bed availability on Tuesday.

## 2016-12-28 NOTE — Discharge Summary (Deleted)
Name: Christine [name] MRN: [id number] DOB: 01/24/1934 83 y.o. PCP: [name], MD  Date of Admission: 12/18/2016  8:09 AM Date of Discharge: 12/28/2016 Attending Physician: Lalla Brothers, MD  Discharge Diagnosis: Principal Problem:   MDD (major depressive disorder), recurrent, severe, with psychosis ([place]) Active Problems:   Hypothyroidism   Type 2 diabetes mellitus, controlled ([place])   Hyperlipidemia   GERD   Coronary artery disease   Acute kidney injury superimposed on chronic kidney disease ([place])   Hypertensive urgency   Malnutrition of moderate degree   Hypoglycemia   Discharge Medications: Allergies as of 12/28/2016      Reactions   Aspirin Swelling, Other (See Comments)   Angioedema   Atarax [hydroxyzine] Other (See Comments)   psychosis   Nsaids Other (See Comments)   "interacts with heart medications"   Codeine Nausea And Vomiting, Other (See Comments)   "makes me deathly sick" - can take with nausea medicine    Haldol [haloperidol] Other (See Comments)   EPS - rigidity and dystonic reaction, very sensitive   Other Other (See Comments)   MSG   Penicillins Diarrhea   "real bad" Has patient had a PCN reaction causing immediate rash, facial/tongue/throat swelling, SOB or lightheadedness with hypotension: Yes Has patient had a PCN reaction causing severe rash involving mucus membranes or skin necrosis: No Has patient had a PCN reaction that required hospitalization No Has patient had a PCN reaction occurring within the last 10 years: Yes If all of the above answers are "NO", then may proceed with Cephalosporin use.      Medication List    STOP taking these medications   cloNIDine 0.1 mg/24hr patch Commonly known as:  CATAPRES - Dosed in mg/24 hr Replaced by:  cloNIDine 0.2 mg/24hr patch   fluticasone 50 MCG/ACT nasal spray Commonly known as:  FLONASE   glimepiride 1 MG tablet Commonly known as:  AMARYL   sodium chloride 1 g tablet     TAKE these  medications   acetaminophen 500 MG tablet Commonly known as:  TYLENOL Take 1,000 mg by mouth every 6 (six) hours as needed for headache. For pain   ADVAIR DISKUS 250-50 MCG/DOSE Aepb Generic drug:  Fluticasone-Salmeterol inhale 1 dose by mouth every 12 hours   atorvastatin 40 MG tablet Commonly known as:  LIPITOR Take 0.5 tablets (20 mg total) by mouth every evening.   cloNIDine 0.2 mg/24hr patch Commonly known as:  CATAPRES - Dosed in mg/24 hr Place 1 patch (0.2 mg total) onto the skin once a week. Start taking on:  01/01/2017 Replaces:  cloNIDine 0.1 mg/24hr patch   clopidogrel 75 MG tablet Commonly known as:  PLAVIX take 1 tablet by mouth every evening   esomeprazole 40 MG capsule Commonly known as:  NEXIUM take 1 capsule by mouth once daily BEFORE BREAKFAST   feeding supplement (GLUCERNA SHAKE) Liqd Take 237 mLs by mouth 3 (three) times daily between meals.   hydroxypropyl methylcellulose / hypromellose 2.5 % ophthalmic solution Commonly known as:  ISOPTO TEARS / GONIOVISC Place 1 drop into both eyes at bedtime.   imipramine 10 MG tablet Commonly known as:  TOFRANIL Take 1 tablet (10 mg total) by mouth 2 (two) times daily. What changed:  medication strength  how much to take   levothyroxine 25 MCG tablet Commonly known as:  SYNTHROID, LEVOTHROID take 1 tablet by mouth once daily   loratadine 10 MG tablet Commonly known as:  CLARITIN Take 10 mg by mouth daily  as needed for allergies, rhinitis or itching.   metoprolol succinate 25 MG 24 hr tablet Commonly known as:  TOPROL XL Take 1 tablet (25 mg total) by mouth daily.   multivitamin with minerals Tabs tablet Take 1 tablet by mouth daily.   NITROSTAT 0.4 MG SL tablet Generic drug:  nitroGLYCERIN place 1 tablet under the tongue if needed every 5 minutes for chest pain for 3 doses IF NO RELIEF AFTER 3RD DOSE CALL PRESCRIBER OR 911.   OLANZapine zydis 5 MG disintegrating tablet Commonly known as:   ZYPREXA Take 0.5 tablets (2.5 mg total) by mouth at bedtime.   polycarbophil 625 MG tablet Commonly known as:  FIBERCON Take 1 tablet (625 mg total) by mouth daily.   polyethylene glycol packet Commonly known as:  MIRALAX / GLYCOLAX Take 17 g by mouth daily as needed.   senna 8.6 MG Tabs tablet Commonly known as:  SENOKOT Take 1 tablet (8.6 mg total) by mouth at bedtime as needed for mild constipation.   valsartan 160 MG tablet Commonly known as:  DIOVAN Take 1 tablet (160 mg total) by mouth daily.   VITAMIN C PO Take 1 tablet by mouth daily.       Disposition and follow-up:   ChristineChristine [name] was discharged from [facility] in Stable condition.  At the hospital follow up visit please address:  1.Severe Depression with Psychotic Features: withdrawn and limited interaction. On Zyprexa and imipramine. Continue to adjust medications as able.   2.  Labs / imaging needed at time of follow-up: None  3.  Pending labs/ test needing follow-up: None  Follow-up Appointments:   Hospital Course by problem list:  Severe Depression with Psychotic Features: Christine [name] has been admitted several times over the past month for worsening functional status at home. She has a long history of major depression that had been well controlled with imipramine. However, over the past month she has had worsening symptoms following the death of her sister with whom she was very close. Her daughter has been convinced that her decline was due to discontinuation of her imipramine during previous hospitalization and has been resistant to any medication changes. She has had a through medical work up including CT head, MRI brain, infectious workup, endocrine work up has all been unrevealing for an underlying disease process. She has been continuing to decline at home, eating and drinking very little and requiring coaxing to take any of her medications. Upon presentation this hospitalization, she  was completely withdrawn and not responding to medical staff or her family. This was significantly worsen that when last seen 2 weeks prior. Initially had psychiatry consult from Dr. Arbutus Ped who the family had seen during previous hospitalizations and were not inclined to meet with him. At that time recommended starting Zyprexa which the family agreed to try. They requested a second opinion and we were able to have Dr. Daron Offer evaluate her who recommended decreasing the Zyprexa dose to 2.5 mg as well as decrease the imipramine dose. Both agreed to referral for geriatric psychiatry for treatment. She has been medically stable and ready for transfer to inpatient psychiatric facility.   Hypoglycemia: Initially hypoglycemia on admission. She was continuing to take her glimepiride in the setting of poor PO intake. Resolved after treatment. Discontinued glimepiride.   HTN: Blood pressure have been labile throughout hospitalization. Improved on clonidine 0.2 mg patch but difficult to obtain vitals during agitation. Unable to initiate any oral medications as patient is intermittently  swallowing pills. Continued on clonidine 0.2 mg patch at discharge.   Discharge Vitals:   BP (!) 184/47 (BP Location: Left Arm)   Pulse 69   Temp 98.2 F (36.8 C) (Oral)   Resp 18   Ht 5\' 4"  (1.626 m)   Wt 104 lb 6.4 oz (47.4 kg)   LMP 02/11/1975   SpO2 100%   BMI 17.92 kg/m   Signed: Maryellen Pile, MD 12/28/2016, 12:46 PM   Pager: [phone]

## 2016-12-28 NOTE — Plan of Care (Signed)
Problem: Safety: Goal: Ability to remain free from injury will improve Outcome: Progressing Ambulating with staff or family in room  Problem: Fluid Volume: Goal: Ability to maintain a balanced intake and output will improve Outcome: Not Progressing Not eating or drinking much today.  Problem: Nutrition: Goal: Adequate nutrition will be maintained Outcome: Not Progressing Not eating or drinking much today

## 2016-12-29 DIAGNOSIS — F333 Major depressive disorder, recurrent, severe with psychotic symptoms: Principal | ICD-10-CM

## 2016-12-29 DIAGNOSIS — E119 Type 2 diabetes mellitus without complications: Secondary | ICD-10-CM

## 2016-12-29 DIAGNOSIS — R410 Disorientation, unspecified: Secondary | ICD-10-CM

## 2016-12-29 LAB — GLUCOSE, CAPILLARY
Glucose-Capillary: 135 mg/dL — ABNORMAL HIGH (ref 65–99)
Glucose-Capillary: 141 mg/dL — ABNORMAL HIGH (ref 65–99)

## 2016-12-29 NOTE — Plan of Care (Signed)
Problem: Education: Goal: Knowledge of [facility] Education information/materials will improve Outcome: Progressing Education for plan of care given to daughter d/t pt confusion and anxiety, with verbal understanding stated

## 2016-12-29 NOTE — Progress Notes (Signed)
  Date: 12/29/2016  Patient name: Christine [name]  Medical record number: [id number]  Date of birth: 01/24/1934   I have seen and evaluated this patient and I have discussed the plan of care with the house staff. Please see Dr. Shanna Cisco note for complete details. I concur with his findings with the following additions/corrections:   I met with Christine [name] daughter [name] today with [facility] nurse officer and Dr. Charlynn Grimes. We had a long discussion about hospital course.  [name] is frustrated with course of mother's care and wanted to discuss.  I reiterated Dr. Autumn Patty teams plan and our interpretation of psychiatry recommendations.  [name] seemed comforted by this.  As I am Christine [name] PCP in the clinic, she also requested that I fill out FMLA paperwork, which I will do during normal business hours Monday or Tuesday this week.   [name], MD 12/29/2016, 6:26 PM

## 2016-12-29 NOTE — Progress Notes (Signed)
Subjective: Patient is doing better this morning. She is sitting up in a chair at bedside reading her bible. Keeps repeating that she needs her clothes and is adamant that someone bring her clothes to her today. She is following commands and answering questions today. Does recognize her daughter as well as Dr. Daryll Drown today.   Had long discussion with patient's daughter this morning with nursing management and Dr. Daryll Drown present. Discussed her concerns and frustrations regarding her mother's hospitalization. She voiced frustration over communication between staff and her. She cited specific examples of changed from lovenox to heparin, changing ARBs (home ARB not on formulary) as well as dosing of the sublingual Zyprexa (see note from yesterday) not being communicated with her. She also voice frustration with no having specific names and details available yesterday when asked about case manager or social workers names. I re-iterated that this information is readily available in the computer and I could provide this information for her if needed but I cannot recall the names of every case manager or social worker I speak with. She was not satisfied with this answer and moved on from this line of talk. Dr. Daryll Drown had long discussion with her about her mother's condition and explained to her the steps we are taking to treat her mother. Re-iterated that we would like to continue to pursue inpatient psychiatric facility placement so that she can have day to day assessments from psychiatry that we are unable to provide here. Also discussed the road blocks we are having with getting this placement.   Daughter requested meetings like this to update her on the status twice a week. Will try to arrange this as best as possible.   Objective: Vital signs in last 24 hours: Vitals:   12/28/16 2345 12/29/16 0634 12/29/16 0803 12/29/16 0926  BP: (!) 171/89 (!) 163/79  (!) 196/96  Pulse:  79    Resp:      Temp:  98.3  F (36.8 C)    TempSrc:  Oral    SpO2:  100% 99%   Weight:      Height:       Weight change:   Intake/Output Summary (Last 24 hours) at 12/29/16 1311 Last data filed at 12/29/16 0900  Gross per 24 hour  Intake                0 ml  Output                0 ml  Net                0 ml    Physical Exam  Constitutional:  Elderly woman sitting up in a chair reading her bible in no acute distress.   Cardiovascular: Normal rate and regular rhythm.   Pulmonary/Chest: Effort normal and breath sounds normal.  Psychiatric:  Mildly agitated, shouting that she needs her clothes from home. Able to recognize family and answer questions appropriately. Cooperative with exam.   Medications: I have reviewed the patient's current medications. Scheduled Meds: . atorvastatin  20 mg Oral QPM  . cloNIDine  0.2 mg Transdermal Weekly  . clopidogrel  75 mg Oral QPM  . enoxaparin (LOVENOX) injection  30 mg Subcutaneous Q24H  . feeding supplement (ENSURE ENLIVE)  237 mL Oral TID BM  . imipramine  10 mg Oral BID  . levothyroxine  25 mcg Oral QAC breakfast  . metoprolol succinate  25 mg Oral QAC supper  . mometasone-formoterol  2  puff Inhalation BID  . OLANZapine zydis  2.5 mg Oral QHS  . pantoprazole  40 mg Oral Daily  . polyvinyl alcohol  1 drop Both Eyes QHS  . thiamine  100 mg Oral Daily   Continuous Infusions: PRN Meds:.acetaminophen, hydrALAZINE **OR** hydrALAZINE, ondansetron **OR** ondansetron (ZOFRAN) IV Assessment/Plan:  MDD with psychotic features, complicated by malnutrition and delirium: Symptoms continue to wax and wane. Improved today as she was able to answer questions appropriately and follow commands. Continued agitation and confusion. Waiting on inpatient psych transfer, was denied form [facility]. No further updates on placement today. Workup for underlying medical process has been unremarkable and this is a primary psychiatric issue that would be best treating at an inpatient  psychiatric facility.  Updated daughter on plan as detailed above.  --imipramine 10mg  daily --olanzapine 2.5mg  sl with PRN olanzapine 5mg  sl --will avoid typical antipsychotics and benzodiazepines for acute confusion and agitation  Episodes of Urinary retention: Stable today. --periodic bladder scans if no urine output --in/out cath for now as necessary  AoCKD, stage 3: Stable.  CAD, HLD: --continue clopidogrel 75mg  daily --continue atorvastatin 20mg  daily  Hypothyroidism: --continue levothyroxine 20mcg daily  HTN: BP labile. Elevated during episodes of agitation and improves after rest.  --clonidine patch 0.2mg  daily --home metoprolol 25mg  daily  Hypokalemia: Resolved. .   Dispo: Disposition is deferred at this time, awaiting improvement of current medical problems.   The patient does have a current PCP [name], MD) and does need an [facility] hospital follow-up appointment after discharge.  The patient does not have transportation limitations that hinder transportation to clinic appointments.  .Services Needed at time of discharge: Y = Yes, Blank = No PT:   OT:   RN:   Equipment:   Other:     LOS: 10 days   Maryellen Pile, MD IMTS PGY-2 [phone] 12/29/2016, 1:11 PM

## 2016-12-29 NOTE — Care Management Note (Signed)
Case Management Note  Patient Details  Name: Christine [name] MRN: [id number] Date of Birth: 01/24/1934  Subjective/Objective: Received call from [place], MD [phone] to ask if CM would call daughter to arrange family meeting as Daryll Drown, MD will be available prior to 1300.                    Action/Plan: Darin Engels, CSW aware of above as they have been working with this family and have no placement at present. CSW will contact MD.    Expected Discharge Date:                  Expected Discharge Plan:  Psychiatric Hospital  In-House Referral:  Clinical Social Work  Discharge planning Services     Post Acute Care Choice:    Choice offered to:     DME Arranged:    DME Agency:     HH Arranged:    [place] Agency:     Status of Service:  Completed, signed off  If discussed at [employer], dates discussed:    Additional Comments:  Delrae Sawyers, RN 12/29/2016, 9:18 AM

## 2016-12-30 LAB — BASIC METABOLIC PANEL
ANION GAP: 13 (ref 5–15)
BUN: 13 mg/dL (ref 6–20)
CALCIUM: 9.7 mg/dL (ref 8.9–10.3)
CO2: 23 mmol/L (ref 22–32)
CREATININE: 1.08 mg/dL — AB (ref 0.44–1.00)
Chloride: 103 mmol/L (ref 101–111)
GFR calc Af Amer: 55 mL/min — ABNORMAL LOW (ref >60)
GFR, EST NON AFRICAN AMERICAN: 47 mL/min — AB (ref >60)
GLUCOSE: 192 mg/dL — AB (ref 65–99)
Potassium: 4.1 mmol/L (ref 3.5–5.1)
Sodium: 139 mmol/L (ref 135–145)

## 2016-12-30 LAB — CBC
HCT: 33.4 % — ABNORMAL LOW (ref 36.0–46.0)
Hemoglobin: 11 g/dL — ABNORMAL LOW (ref 12.0–15.0)
MCH: 29.4 pg (ref 26.0–34.0)
MCHC: 32.9 g/dL (ref 30.0–36.0)
MCV: 89.3 fL (ref 78.0–100.0)
PLATELETS: 272 10*3/uL (ref 150–400)
RBC: 3.74 MIL/uL — ABNORMAL LOW (ref 3.87–5.11)
RDW: 14.7 % (ref 11.5–15.5)
WBC: 5.6 10*3/uL (ref 4.0–10.5)

## 2016-12-30 LAB — GLUCOSE, CAPILLARY
GLUCOSE-CAPILLARY: 129 mg/dL — AB (ref 65–99)
GLUCOSE-CAPILLARY: 130 mg/dL — AB (ref 65–99)
Glucose-Capillary: 173 mg/dL — ABNORMAL HIGH (ref 65–99)
Glucose-Capillary: 235 mg/dL — ABNORMAL HIGH (ref 65–99)

## 2016-12-30 MED ORDER — CLONIDINE HCL 0.3 MG/24HR TD PTWK: 0.3000 mg | MEDICATED_PATCH | TRANSDERMAL | Status: DC

## 2016-12-30 MED ORDER — CLONIDINE HCL 0.3 MG/24HR TD PTWK
0.3000 mg | MEDICATED_PATCH | TRANSDERMAL | Status: DC
  Administered 2016-12-31: 0.3 mg via TRANSDERMAL
  Filled 2016-12-30: qty 1

## 2016-12-30 NOTE — Progress Notes (Signed)
Pt's daughter stated that she slid from recliner chair to the floor accidentally. Fall was unwitnessed by staff and family member refused to have any further evaluation done.

## 2016-12-30 NOTE — Clinical Social Work Note (Signed)
CSW met with daughter and patient at bedside to update on disposition. CSW explained that we do not have a bed at this time. CSW spoke with [facility] disposition CSW to request additional options, [facility] CSW was able to provide other facilities that may be able to accept the patient. CSW has made referrals with updated labs to the following facilities and waits for an available bed for the patient. CSW will followup with listed facilities in the morning.  [place] MSW, [place], [place], [phone]

## 2016-12-30 NOTE — Clinical Social Work Note (Signed)
Patient's disposition discussed with MD. Recommendation for inpatient geri-psych remains. CSW has spoken with [facility] who continues to state that the patient is "too medically acute" for them to manage, mostly citing issues with the patient's blood pressure. CSW spoke with Kennyth Lose at [facility] [place] and the facility states that they need updated lab work, specifically CBC and BMP before they can make a final decision regarding the referral. [place] also states that the patient will have to be IVC'ed before they would accept the patient. CSW will send updated lab work once received.   Liz Beach MSW, [place], [place], [id number]

## 2016-12-30 NOTE — Progress Notes (Signed)
Nursing tech notified nurse of high BP, rechecked manually 220/110, PRN hydralazine given.  Edward Qualia RN

## 2016-12-30 NOTE — Care Management Important Message (Signed)
Important Message  Patient Details  Name: Christine [name] MRN: [id number] Date of Birth: 01/24/1934   Medicare Important Message Given:  Yes    Aslan Himes 12/30/2016, 4:21 PM

## 2016-12-30 NOTE — Progress Notes (Signed)
Internal Medicine Attending:   I saw and examined the patient. I reviewed the resident's note and I agree with the resident's findings and plan as documented in the resident's note.  83 year old woman now admitted on hospital day #12 with severe depression with psychotic features. This morning on exam she is sitting up in bed, mumbling to herself, briefly answers our questions but does not follow commands. Continues to eat very little. Blood pressures fine overnight. No significant progress in her condition so far. We are awaiting transfer to inpatient psychiatry unit, we will discuss with psychiatry from [facility] about options for further management there.

## 2016-12-30 NOTE — Progress Notes (Signed)
   Subjective:  Patient sitting up in bed, perseverating on not being on schedule for her shower and medicines. Patient is alert and follows commands. No specific complaints.   Per daughter, patient was able to sleep last night and is having difficulty with awareness of time and adjusting to it. This happened yesterday as well but improved throughout the day. Later on in the morning, daughter informed us of patient coughing up some white/tan sputum once and complaining of being cold.  Spoke with social work this morning and updated them on Dr. Shanna Cisco conversation with [facility]. [place] was in contact with social work and requested updated CBC and BMP.  Objective:  Vital signs in last 24 hours: Vitals:   12/29/16 1745 12/29/16 2002 12/30/16 0600 12/30/16 1108  BP: (!) 131/55 (!) 141/52 (!) 162/77 (!) 201/125  Pulse: 76 81 72 82  Resp:   18 (!) 21  Temp:  99.1 F (37.3 C) 98.8 F (37.1 C) 98.8 F (37.1 C)  TempSrc:  Oral Axillary Oral  SpO2:  100% 99% 96%  Weight:      Height:       Constitutional: sitting up in bed, perseverating CV: RRR, systolic ejection murmur, pulses intact and regular Resp: no increased work of breathing, CTAB, no crackles or rhonchi Msk: no calf tenderness or palpable chords Neuro: mild rigidity   Assessment/Plan:  Principal Problem:   MDD (major depressive disorder), recurrent, severe, with psychosis ([place]) Active Problems:   Hypothyroidism   Type 2 diabetes mellitus, controlled ([place])   Hyperlipidemia   GERD   Coronary artery disease   Acute kidney injury superimposed on chronic kidney disease (HCC)   Hypertensive urgency   Malnutrition of moderate degree   Hypoglycemia  MDD with psychotic features, complicated by malnutrition: Patient continues to wax and wane in terms of confusion, agitation, and still having significant guilt. Workup for underlying medical process has been unremarkable. She has been evaluated twice during this  admission by psychiatry who recommend inpatient admission; she has been declined by [facility] and we are still pursuing [facility] admission. We re-consulted psych and will be expanding to include [facility] admission as well as patient requires primarily psychiatric managment.   --imipramine 10mg  daily --olanzapine 2.5mg  sl with PRN olanzapine 5mg  sl --will avoid typical antipsychotics and benzodiazepines for acute confusion and agitation --Transfer to [place] pending - social work on board - obtaining updated CBC and BMP  Episodes of Urinary retention: Stable today. --periodic bladder scans if no urine output --in/out cath for now as necessary  CKD, stage 3: Stable. --f/u AM BMP   CAD, HLD: --continue clopidogrel 75mg  daily --continue atorvastatin 20mg  daily  Hypothyroidism: --continue levothyroxine 71mcg daily  HTN: Intermittently hypertensive during episodes of agitation.  --clonidine patch 0.2mg  daily --home metoprolol 25mg  daily  --PRN hydralazine  Dispo: Anticipated discharge in approximately 2-3 day(s); pending transfer [facility] inpt geri-psych.   Alphonzo Grieve, MD 12/30/2016, 11:26 AM Pager [phone]

## 2016-12-31 ENCOUNTER — Telehealth: Payer: Self-pay

## 2016-12-31 DIAGNOSIS — I959 Hypotension, unspecified: Secondary | ICD-10-CM

## 2016-12-31 DIAGNOSIS — Z885 Allergy status to narcotic agent status: Secondary | ICD-10-CM

## 2016-12-31 DIAGNOSIS — Z886 Allergy status to analgesic agent status: Secondary | ICD-10-CM

## 2016-12-31 DIAGNOSIS — K219 Gastro-esophageal reflux disease without esophagitis: Secondary | ICD-10-CM

## 2016-12-31 LAB — GLUCOSE, CAPILLARY
Glucose-Capillary: 154 mg/dL — ABNORMAL HIGH (ref 65–99)
Glucose-Capillary: 235 mg/dL — ABNORMAL HIGH (ref 65–99)
Glucose-Capillary: 197 mg/dL — ABNORMAL HIGH (ref 65–99)

## 2016-12-31 MED ORDER — CLONIDINE HCL 0.3 MG/24HR TD PTWK: 0.3000 mg | MEDICATED_PATCH | TRANSDERMAL | 12 refills | Status: DC

## 2016-12-31 MED ORDER — GLUCOSE BLOOD VI STRP: ORAL_STRIP | 12 refills | Status: DC

## 2016-12-31 MED ORDER — AMLODIPINE BESYLATE 10 MG PO TABS: 10.0000 mg | ORAL_TABLET | Freq: Every day | ORAL | 2 refills | Status: DC

## 2016-12-31 MED ORDER — OLANZAPINE 5 MG PO TBDP: 2.5000 mg | ORAL_TABLET | Freq: Every day | ORAL | 0 refills | Status: DC

## 2016-12-31 MED ORDER — AMLODIPINE BESYLATE 5 MG PO TABS
5.0000 mg | ORAL_TABLET | Freq: Every day | ORAL | Status: DC
  Administered 2016-12-31: 5 mg via ORAL
  Filled 2016-12-31: qty 1

## 2016-12-31 MED ORDER — WHITE PETROLATUM GEL
Status: DC | PRN
  Filled 2016-12-31: qty 1

## 2016-12-31 NOTE — Progress Notes (Signed)
Received a call from [facility] stating that they are unable to accept patient due to county jurisdictions. Pt does not qualify being that pt is from [facility]. [facility] Numbers stated that pt should be referred to [facility]. Will pass this information along to day RN for Case Worker.

## 2016-12-31 NOTE — Telephone Encounter (Signed)
Hospital TOC per Dr Jari Favre, discharge 12/31/2016, appt 01/02/2017.

## 2016-12-31 NOTE — Clinical Social Work Note (Signed)
Call received from daughter stating that she is not happy with the facility that has offered Film/video editor). Daughter states that she is not happy with the reviews she sees online for the facility. She also states that the facility has had deficiencies in the past which she is not happy with. The daughter states that she will not allow the patient to be discharged to this facility. CSW explained that this is the only option available to the patient. The patient's daughter states that she will appeal the patient's discharge as she feels that the patient's psych concerns have been caused by the hospital. CSW reiterated that appealing the discharge would not change the patient's discharge options as the offer from [facility] is the only option. CSW will contact Psych to determine if the patient needs to be IVC'ed and sent to [facility], or if psychiatry will clear the patient to return home with the daughter.   Liz Beach MSW, [place], [place], [id number]

## 2016-12-31 NOTE — Clinical Social Work Note (Signed)
Per MD, the patient will DC home with daughter with outpatient followup. CSW signing off at this time.  Liz Beach MSW, [place], [place], [id number]

## 2016-12-31 NOTE — Progress Notes (Signed)
Subjective:  Patient is sitting up, able to converse with [place] and answer appropriately. Per daughter she reported feeling like something was crawling up her legs this morning. She continues to have more confusion in the morning with perseveration on not being on her regular schedule, however this has been improving in the afternoons.   We spoke with patient and patient's family (daughter Christine [name] and son Christine [name]) about today's developments. Patient has been offered a bed at inpatient psych at [facility]; family had looked into the facility and do not feel that is an appropriate place for her at this time due to their research into the facility. They were made aware of this being the only option at this time in terms of inpatient psychiatric admission. Family would be more comfortable with patient going home at this time with close outpatient follow up and feel that she has made enough progress where they are able to adequately support her at this time.   Objective:  Vital signs in last 24 hours: Vitals:   12/30/16 2223 12/31/16 0636 12/31/16 0800 12/31/16 1046  BP: (!) 168/46 (!) 150/67 (!) 179/97   Pulse: 68 76 72 90  Resp: 18  18 (!) 22  Temp: 98 F (36.7 C) 97.8 F (36.6 C) 98.5 F (36.9 C)   TempSrc: Axillary Axillary Oral   SpO2: 100% 100% 100% 99%  Weight:      Height:       Constitutional: sitting up in bed, perseverating Resp: no increased work of breathing Msk: no calf tenderness or palpable chords Neuro: no rigidity Psych: patient is able to participate in conversation to an extent; is aware of why she is in the hospital. No further auditory or visual hallucinations. Possibly some tactile hallucination this morning.   Assessment/Plan:  Principal Problem:   MDD (major depressive disorder), recurrent, severe, with psychosis ([place]) Active Problems:   Hypothyroidism   Type 2 diabetes mellitus, controlled ([place])   Hyperlipidemia   GERD   Coronary artery disease   Acute kidney  injury superimposed on chronic kidney disease ([place])   Hypertensive urgency   Malnutrition of moderate degree   Hypoglycemia  MDD with psychotic features, complicated by malnutrition: Patient has some improvements in her PO intake, alertness, ability to interact, and episodes of agitation. Family feels like they have reached a point in her progress where they are able to care for her at home with close follow up with internal medicine clinic and outpatient psych, which is appropriate. The ideal situation would be for her to go to inpatient geripsych, but it is the family's view that an appropriate facility would not be available at this time and the patient would benefit from pursuing outpatient care. They are in agreement that if patient decompensates from the psychiatric point of view, inpatient psych could be pursued with the help of Dr. Casimiro Needle who they will establish with for further management of her depression. At this time, patient is otherwise medically stable, has appropriate support at home, and has improved enough where it would be safe for her to continue outpatient treatment at this time in light of our current options.  --imipramine 10mg  daily --olanzapine 2.5mg  sl  --will avoid typical antipsychotics and benzodiazepines for acute confusion and agitation  Episodes of Urinary retention: Stable; no further episodes.   CKD, stage 3: Stable.  CAD, HLD: --continue clopidogrel 75mg  daily --continue atorvastatin 20mg  daily  Hypothyroidism: --continue levothyroxine 57mcg daily  HTN: Intermittently hypertensive during episodes of agitation.  --clonidine  patch 0.3mg  daily --amlodipine 10mg  daily   Dispo: Anticipated discharge today with f/u in [facility] and outpatient psych.    Alphonzo Grieve, MD 12/31/2016, 2:45 PM Pager [phone]

## 2016-12-31 NOTE — Clinical Social Work Note (Signed)
CSW has received bed offer from [facility]. Patient's daughter has been notified of offer. Daughter asks that she have time to discuss with her brother. Daughter also insists that she needs to be with patient 24/7. CSW explained that this will likely not be allowed. CSW has spoken with [facility] at facility who states that the daughter cannot stay with the patient at the facility but she can visit. MD updated on bed availability.    Liz Beach MSW, [place], [place], [id number]

## 2016-12-31 NOTE — Clinical Social Work Note (Addendum)
CSW followed up with facilities this AM:  [facility] - "Medically declined" [facility] - "Doesn't qualify" [facility] - "Reviewing" [facility] - No longer has geri-psych unit [facility] - "Reviewing" [facility] - "Reviewing" [facility] - No bed at this time [facility] - Updated CBC and BMP reviewed. Admissions state that the facility now has no beds and will not be able to admit this week. [facility] - Message left requesting call back regarding bed availability [facility] - No beds at this time. [name] - Reviewing and will call CSW regarding potential admission. [facility] - Reviewing and will call CSW regarding potential admission. [facility] - "To medical for facility" [facility] - "To medically acute."   Liz Beach MSW, [place], [place], [id number]

## 2016-12-31 NOTE — Progress Notes (Signed)
Internal Medicine Attending:   I saw and examined the patient today. We had a good talk with Ms. Christine, [name], and [name] about disposition. I noted all the work being done by [employer] our Education officer, museum. [name] and [name] are very concerned about the poor reviews and distance to [name] and are declining that transfer. I empathize with there position. It is clear to [place] that [name] has made good progress compared to admission. She is much more calm, very little agitation, not responding to internal stimuli as badly. [name] agrees that her mother is doing much better, though still not back to baseline. The family wants to bring her home, which I think is appropriate. The patient is not a risk to herself or to anyone else, she does not meet criteria for IVC. We were clear that we should continue her current psych medications including imipramine 10mg  bid and zyprexa 2.5mg  nightly. Dr. Casimiro Needle will take over management of the psychoactive medications as an outpatient. We will see her in [facility] on Thursday and we will manage her hypertension, diabetes, and weight loss.   Overall I think inpatient psychiatry would have been the ideal disposition, but we are again constrained by a paucity of beds. I am ok with discharging the patient to home under the care of her family, they have agreed to provide exactly the medications that we prescribe. The family agrees with discharge to home and are excited to have there mother back there.

## 2016-12-31 NOTE — Care Management Note (Signed)
Case Management Note  Patient Details  Name: CEDRICKA WOLLAN MRN: [id number] Date of Birth: 01/24/1934  Subjective/Objective: pt presented for  Severe Depressive Disorder. Plan now will be to return home with support of daughter. Prior to admission, pt was active with [facility] for [employer].                    Action/Plan: Agency List was provided to patient and daughter and daughter chose [facility]. Referral made to [facility]. SOC to begin within 24-48 hours post d/c. Pt needed DME Shower Chair- order obtained and family to take by medical supply store. Pt will f/u outpatient with Psych. No further needs from CM at this time.   Expected Discharge Date:  12/31/16               Expected Discharge Plan:  [place]  In-House Referral:  Clinical Social Work  Discharge planning Services  CM Consult  Post Acute Care Choice:  Home Health Choice offered to:  Adult Children, Patient  DME Arranged:  Shower stool (Patient to go to store to pick up. ) DME Agency:  NA  HH Arranged:  RN, Social Work [employer] Agency:  [place]  Status of Service:  Completed, signed off  If discussed at [employer], dates discussed:    Additional Comments:  Bethena Roys, RN 12/31/2016, 5:12 PM

## 2017-01-02 ENCOUNTER — Encounter (INDEPENDENT_AMBULATORY_CARE_PROVIDER_SITE_OTHER): Payer: Self-pay

## 2017-01-02 ENCOUNTER — Ambulatory Visit (INDEPENDENT_AMBULATORY_CARE_PROVIDER_SITE_OTHER): Payer: Medicare Other | Admitting: Internal Medicine

## 2017-01-02 VITALS — BP 159/67 | HR 98 | Temp 97.9°F | Ht 64.0 in | Wt 105.7 lb

## 2017-01-02 DIAGNOSIS — E119 Type 2 diabetes mellitus without complications: Secondary | ICD-10-CM

## 2017-01-02 DIAGNOSIS — I10 Essential (primary) hypertension: Secondary | ICD-10-CM | POA: Diagnosis not present

## 2017-01-02 DIAGNOSIS — Z5189 Encounter for other specified aftercare: Secondary | ICD-10-CM

## 2017-01-02 DIAGNOSIS — Z79899 Other long term (current) drug therapy: Secondary | ICD-10-CM

## 2017-01-02 DIAGNOSIS — F333 Major depressive disorder, recurrent, severe with psychotic symptoms: Secondary | ICD-10-CM

## 2017-01-02 LAB — POCT GLYCOSYLATED HEMOGLOBIN (HGB A1C): HEMOGLOBIN A1C: 7.5

## 2017-01-02 LAB — GLUCOSE, CAPILLARY: Glucose-Capillary: 145 mg/dL — ABNORMAL HIGH (ref 65–99)

## 2017-01-02 MED ORDER — METOPROLOL SUCCINATE ER 25 MG PO TB24: 25.0000 mg | ORAL_TABLET | Freq: Every day | ORAL | 3 refills | Status: DC

## 2017-01-02 NOTE — Assessment & Plan Note (Signed)
During admission, patient had labile blood pressure due to agitation and refusal of medications. She was discharged on clonidine 0.3 mg patch and amlodipine 10 mg daily. Patient continues to be hypertensive. At home her blood pressures range from [id number] systolic. Today in clinic her blood pressure is elevated at 150s to 160s.  Plan: -Continue amlodipine 10 mg once a day -Continue clonidine 0.3 mg patch -Restart metoprolol 25 mg once a day

## 2017-01-02 NOTE — Progress Notes (Signed)
CC: Hospital follow-up for major depression  HPI: Christine [name] is a 83 y.o. female with PMHx of severe depression with psychotic features, diabetes, hypertension who presents to the clinic for hospital follow-up for severe depression with psychotic features.   Patient was recently admitted in January 2018 for acute worsening of her major depression and functional status. Patient had previously been well controlled with imipramine. However, in January patient's symptoms seemed to worsen after the death of her sister. Medical workup for underlying disease process was negative. Inpatient psych consult recommended starting Zyprexa, second opinion psych consult recommended increasing Zyprexa and imipramine. Patient was accepted at inpatient psychiatric facility, but family and patient desired to return home. Patient is to follow up with Dr. Casimiro Needle for outpatient Geripsychiatric treatment. Since, discharge, patient has been doing better. Patient does report occasionally hearing sirens. She is not agitated. She is interacting more. She is alert to time and place.  Patient has a history of diabetes, but glimepiride was discontinued as inpatient given inconsistent and poor by mouth intake and hypoglycemia. Patient's appetite has improved and she has been eating more consistently over the last 2 days. Home glucose measurements have ranged from 100s to 300s. She is not currently taking glimepiride.  During admission, patient had labile blood pressure due to agitation and refusal of medications. She was discharged on clonidine 0.3 mg patch and amlodipine 10 mg daily. Patient continues to be hypertensive. At home her blood pressures range from [id number] systolic. Today in clinic her blood pressure is elevated at 150s to 160s.  Past Medical History:  Diagnosis Date  . Anemia   . Anginal pain ([place])   . Arthritis    "back, arms, hips; hands" (11/30/2015)  . Benign hypertensive heart and kidney disease with  diastolic CHF, NYHA class II and CKD stage III ([place])   . Blood transfusion 1972   "after daughter born, attempted to give me blood; couldn't give it cause my blood was cold" (09/23/2013)  . CAD (coronary artery disease)    a. LHC 08/23/11: dLM 40-50%, oRI 40%, oCFX 40%, oD1 70% (small and not amenable to PCI).  LM lesion did not appear to be flow limiting.  Medical rx was recommended;  b. Echo 08/23/11: mild LVH, EF 60-65%, grade 1 diast dysfxn, mild BAE, PASP 24;  c. 02/2012  Cath: LM 50d, LAD 50p, D1 70ost, RI 70p, RCA ok->Med Rx;  d. 01/2013 Cardiolite: EF 88, no ischemia/infarct.  . Carotid stenosis    a. dopplers 10/12:  0-39% bilat ICA;  b. 10/2012 U/S: 0-39% bilat, f/u 1 yr (10/2013).  . Chronic bronchitis   . Depression   . Diverticulosis of colon (without mention of hemorrhage)   . GERD (gastroesophageal reflux disease)   . Heart murmur, systolic    2-D echo in December 2011 showed a normal EF with grade 1 diastolic dysfunction, trivial pulmonary regurgitation and mildly elevated PA pressure at 37 mmHg probably secondary to her COPD.dynamic obstruction-mid cavity obliteration;  b. 02/2012 Echo: EF 60-65%, mild LVH, PASP 34mmHg.  Marland Kitchen History of stomach ulcers 1970's  . Hyperlipidemia   . Hypertension   . Hypothyroidism   . Migraines    a. Next atypical symptoms in the past. Patient was started on Neurontin for possible neuropathic origin of her pain.  . Polymyalgia rheumatica ([place])   . Shortness of breath on exertion    "just related to angina >1 yr ago" (09/23/2013)  . Sinus arrhythmia   . Type II  diabetes mellitus ([place])    a. On oral hypoglycemic agents.    Review of Systems: Please see pertinent ROS reviewed in HPI and problem based charting.   Physical Exam: Vitals:   01/02/17 1018  BP: (!) 159/67  Pulse: 98  Temp: 97.9 F (36.6 C)  TempSrc: Oral  SpO2: 98%  Weight: 105 lb 11.2 oz (47.9 kg)  Height: 5\' 4"  (1.626 m)   General: Vital signs reviewed.  Patient is Thin,  chronically ill, in no acute distress and cooperative with exam.  Cardiovascular: Tachycardic, regular rhythm, S1 normal, S2 normal, no murmurs, gallops, or rubs. Pulmonary/Chest: Clear to auscultation bilaterally, no wheezes, rales, or rhonchi. Abdominal: Soft, non-tender, non-distended, BS + Extremities: No lower extremity edema bilaterally Neurological: A&O x3 Psychiatric: Flat, depressed mood and affect. speech and behavior is reserved and hesitant. Cognition and memory are abnormal.   Assessment & Plan:  See encounters tab for problem based medical decision making. Patient discussed with Dr. Beryle Beams

## 2017-01-02 NOTE — Assessment & Plan Note (Signed)
Patient has a history of diabetes, but glimepiride was discontinued as inpatient given inconsistent and poor by mouth intake and hypoglycemia. Patient's appetite has improved and she has been eating more consistently over the last 2 days. Home glucose measurements have ranged from 100s to 300s. She is not currently taking glimepiride.  Plan: -The patient continues to eat consistently, restart glimepiride 1 mg -Follow-up in one week

## 2017-01-02 NOTE — Patient Instructions (Addendum)
Christine [name],  Physical pleasure to meet you today.  For your blood pressure: Continue to take amlodipine 10 mg once a day and continue your clonidine patch.  For your diabetes: If you continue to eat consistently, please restart your glimepiride 1 mg.   For your depression: Continue your imipramine 10 mg twice a day and Zyprexa 2.5 mg at nighttime. I will contact Dr. Karen Chafe office to determine when your appointment is.  Please follow up with [place] in 1 month.

## 2017-01-02 NOTE — Assessment & Plan Note (Signed)
Patient was recently admitted in January 2018 for acute worsening of her major depression and functional status. Patient had previously been well controlled with imipramine. However, in January patient's symptoms seemed to worsen after the death of her sister. Medical workup for underlying disease process was negative. Inpatient psych consult recommended starting Zyprexa, second opinion psych consult recommended increasing Zyprexa and imipramine. Patient was accepted at inpatient psychiatric facility, but family and patient desired to return home. Patient is to follow up with Dr. Casimiro Needle for outpatient Geripsychiatric treatment. Since, discharge, patient has been doing better. Patient does report occasionally hearing sirens. She is not agitated. She is interacting more. She is alert to time and place.  Plan: -Continue imipramine 10 mg twice a day -Continue Zyprexa -Follow-up with Dr. Casimiro Needle -Follow-up in our clinic in one week

## 2017-01-02 NOTE — Progress Notes (Signed)
Medicine attending: Medical history, presenting problems, physical findings, and medications, reviewed with resident physician Dr Alexa Burns on the day of the patient visit and I concur with her evaluation and management plan. 

## 2017-01-03 ENCOUNTER — Telehealth: Payer: Self-pay | Admitting: *Deleted

## 2017-01-03 ENCOUNTER — Encounter: Payer: Self-pay | Admitting: Internal Medicine

## 2017-01-03 DIAGNOSIS — E43 Unspecified severe protein-calorie malnutrition: Secondary | ICD-10-CM | POA: Diagnosis not present

## 2017-01-03 DIAGNOSIS — E871 Hypo-osmolality and hyponatremia: Secondary | ICD-10-CM | POA: Diagnosis not present

## 2017-01-03 DIAGNOSIS — I130 Hypertensive heart and chronic kidney disease with heart failure and stage 1 through stage 4 chronic kidney disease, or unspecified chronic kidney disease: Secondary | ICD-10-CM | POA: Diagnosis not present

## 2017-01-03 DIAGNOSIS — E039 Hypothyroidism, unspecified: Secondary | ICD-10-CM | POA: Diagnosis not present

## 2017-01-03 DIAGNOSIS — Z681 Body mass index (BMI) 19 or less, adult: Secondary | ICD-10-CM | POA: Diagnosis not present

## 2017-01-03 DIAGNOSIS — R634 Abnormal weight loss: Secondary | ICD-10-CM | POA: Diagnosis not present

## 2017-01-03 LAB — BMP8+ANION GAP
ANION GAP: 16 mmol/L (ref 10.0–18.0)
BUN/Creatinine Ratio: 12 (ref 12–28)
BUN: 11 mg/dL (ref 8–27)
CALCIUM: 9.7 mg/dL (ref 8.7–10.3)
CHLORIDE: 100 mmol/L (ref 96–106)
CO2: 24 mmol/L (ref 18–29)
Creatinine, Ser: 0.91 mg/dL (ref 0.57–1.00)
GFR calc Af Amer: 69 mL/min/{1.73_m2} (ref >59)
GFR calc non Af Amer: 60 mL/min/{1.73_m2} (ref >59)
Glucose: 219 mg/dL — ABNORMAL HIGH (ref 65–99)
POTASSIUM: 4.1 mmol/L (ref 3.5–5.2)
Sodium: 140 mmol/L (ref 134–144)

## 2017-01-03 NOTE — Telephone Encounter (Signed)
[place], [place] calls and ask for continued HHN, PT, OT and HH csw, VO given to continue and initiate services in the home, do you agree?

## 2017-01-03 NOTE — Telephone Encounter (Signed)
Was seen in clinic by dr burns 2/15

## 2017-01-03 NOTE — Telephone Encounter (Signed)
Agree 

## 2017-01-06 DIAGNOSIS — E871 Hypo-osmolality and hyponatremia: Secondary | ICD-10-CM | POA: Diagnosis not present

## 2017-01-06 DIAGNOSIS — R634 Abnormal weight loss: Secondary | ICD-10-CM | POA: Diagnosis not present

## 2017-01-06 DIAGNOSIS — E039 Hypothyroidism, unspecified: Secondary | ICD-10-CM | POA: Diagnosis not present

## 2017-01-06 DIAGNOSIS — E43 Unspecified severe protein-calorie malnutrition: Secondary | ICD-10-CM | POA: Diagnosis not present

## 2017-01-06 DIAGNOSIS — I130 Hypertensive heart and chronic kidney disease with heart failure and stage 1 through stage 4 chronic kidney disease, or unspecified chronic kidney disease: Secondary | ICD-10-CM | POA: Diagnosis not present

## 2017-01-06 DIAGNOSIS — Z681 Body mass index (BMI) 19 or less, adult: Secondary | ICD-10-CM | POA: Diagnosis not present

## 2017-01-08 ENCOUNTER — Ambulatory Visit (INDEPENDENT_AMBULATORY_CARE_PROVIDER_SITE_OTHER): Payer: Medicare Other | Admitting: Internal Medicine

## 2017-01-08 ENCOUNTER — Encounter (INDEPENDENT_AMBULATORY_CARE_PROVIDER_SITE_OTHER): Payer: Self-pay

## 2017-01-08 DIAGNOSIS — I10 Essential (primary) hypertension: Secondary | ICD-10-CM | POA: Diagnosis not present

## 2017-01-08 DIAGNOSIS — F333 Major depressive disorder, recurrent, severe with psychotic symptoms: Secondary | ICD-10-CM | POA: Diagnosis not present

## 2017-01-08 DIAGNOSIS — E1165 Type 2 diabetes mellitus with hyperglycemia: Secondary | ICD-10-CM

## 2017-01-08 DIAGNOSIS — I2510 Atherosclerotic heart disease of native coronary artery without angina pectoris: Secondary | ICD-10-CM | POA: Diagnosis not present

## 2017-01-08 DIAGNOSIS — Z5189 Encounter for other specified aftercare: Secondary | ICD-10-CM | POA: Diagnosis not present

## 2017-01-08 MED ORDER — METOPROLOL SUCCINATE ER 25 MG PO TB24: 12.5000 mg | ORAL_TABLET | Freq: Every day | ORAL | 3 refills | Status: DC

## 2017-01-08 MED ORDER — CLONIDINE HCL 0.2 MG/24HR TD PTWK: 0.2000 mg | MEDICATED_PATCH | TRANSDERMAL | 0 refills | Status: DC

## 2017-01-08 NOTE — Patient Instructions (Addendum)
It was a pleasure to see you today Christine [name],   For your blood pressure,  We are going to slowly take you off of the clonidine patch medication, today we will switch to a 0.2 mg patch  START taking metoprolol 12.5 mg daily at well  Continue taking amlodipine 10 mg daily   Please schedule a follow up appointment in 1 weeks

## 2017-01-08 NOTE — Progress Notes (Signed)
CC: follow up of depression    HPI: [name] is a 83 y.o. with past medical history as outlined below who presents to clinic for follow up of depression and hypertension. Her daughter and son are in the room and provide parts of the information in this interview.   Depression  She was recently hospitalized for severe depression with psychotic features and discharged on zyprexa and an increased dose of imipramine. Since discharge her family feels that her symptoms of slowed speech, guilt, and decreased interest have improved. She has become mor interactive and returned to perfoming her ADLs of eating, taking her medications, and ironing her clothes. Earlier this week she did have thoughts  that she may do something to harm her daughter but says that she did not have a plan to do so. These thoughts were disturbing to the pt and her daughter says that the pt communicated this to her. Since the change in medications she denies suicidal ideation, sedation, sx of orthostatic hypotension, or changes in movement.They have an appointment to see Dr. Geanie Kenning a geripsych tomorrow.   HTN  Pt oral BP meds were changed when she was unwilling to swallow during hospitalization for severe depression from valsartan to clonidine 0.3mg  patch and amlodipine 10 mg daily. Since this admission she has resumed taking medications by mouth. At last visit she was told to resume taking metoprolol but her daughter decided not to restart this medication yet because she had some morning blood pressure readings of 120/80.  DM   Daughter states they have started back glimepiride yesterday. Patient has had consistent improvement in oral intake. Home glucose readings have been in the 200s.   Please see problem list for status of the pt's chronic medical problems.  Past Medical History:  Diagnosis Date  . Anemia   . Anginal pain ([place])   . Arthritis    "back, arms, hips; hands" (11/30/2015)  . Benign hypertensive  heart and kidney disease with diastolic CHF, NYHA class II and CKD stage III ([place])   . Blood transfusion 1972   "after daughter born, attempted to give me blood; couldn't give it cause my blood was cold" (09/23/2013)  . CAD (coronary artery disease)    a. LHC 08/23/11: dLM 40-50%, oRI 40%, oCFX 40%, oD1 70% (small and not amenable to PCI).  LM lesion did not appear to be flow limiting.  Medical rx was recommended;  b. Echo 08/23/11: mild LVH, EF 60-65%, grade 1 diast dysfxn, mild BAE, PASP 24;  c. 02/2012  Cath: LM 50d, LAD 50p, D1 70ost, RI 70p, RCA ok->Med Rx;  d. 01/2013 Cardiolite: EF 88, no ischemia/infarct.  . Carotid stenosis    a. dopplers 10/12:  0-39% bilat ICA;  b. 10/2012 U/S: 0-39% bilat, f/u 1 yr (10/2013).  . Chronic bronchitis   . Depression   . Diverticulosis of colon (without mention of hemorrhage)   . GERD (gastroesophageal reflux disease)   . Heart murmur, systolic    2-D echo in December 2011 showed a normal EF with grade 1 diastolic dysfunction, trivial pulmonary regurgitation and mildly elevated PA pressure at 37 mmHg probably secondary to her COPD.dynamic obstruction-mid cavity obliteration;  b. 02/2012 Echo: EF 60-65%, mild LVH, PASP 63mmHg.  Marland Kitchen History of stomach ulcers 1970's  . Hyperlipidemia   . Hypertension   . Hypothyroidism   . Migraines    a. Next atypical symptoms in the past. Patient was started on Neurontin for possible neuropathic origin of  her pain.  . Polymyalgia rheumatica ([place])   . Shortness of breath on exertion    "just related to angina >1 yr ago" (09/23/2013)  . Sinus arrhythmia   . Type II diabetes mellitus ([place])    a. On oral hypoglycemic agents.    Review of Systems:  Please see each problem below for a pertinent review of systems.  Physical Exam:  Vitals:   01/08/17 1007  BP: (!) 169/64  Pulse: 97  Temp: 98.3 F (36.8 C)  TempSrc: Oral  SpO2: 100%  Weight: 104 lb 3.2 oz (47.3 kg)  Height: 5\' 4"  (1.626 m)   Physical Exam    Constitutional: She appears well-nourished. No distress.  She is sitting comfortably taking notes in her notebook, she is engaged and ask questions throughout the interview   Eyes: Conjunctivae are normal. No scleral icterus.  Cardiovascular: Normal rate and regular rhythm.   No murmur heard. Pulmonary/Chest: Effort normal. No respiratory distress. She has no wheezes. She has no rales.  Neurological: She is alert.  Skin: Skin is warm and dry. She is not diaphoretic.  Psychiatric: She has a normal mood and affect. Her behavior is normal.   Assessment & Plan:   See Encounters Tab for problem based charting.  Depression  Reported improvement in symptoms of depression. She will be establishing with geripsych tomorrow.  -Continue imipramine 10 mg daily -Continue Zyprexa  HTN  I think she would benefit from being weaned from clonidine patch back to po anti-htn meds. Will initiate that wean today and restart metoprolol as her blood pressure is not controlled today.  - Prescribed clonidine 0.2 mg patch  - Restart metoprolol 12.5 mg daily qHS  Diabetes With resumption of by mouth intake and elevated CBGs I have encouraged them to continue taking glimepiride.  - continue glimepiride   Patient discussed with Dr. Lynnae January

## 2017-01-09 DIAGNOSIS — F332 Major depressive disorder, recurrent severe without psychotic features: Secondary | ICD-10-CM | POA: Diagnosis not present

## 2017-01-11 DIAGNOSIS — I130 Hypertensive heart and chronic kidney disease with heart failure and stage 1 through stage 4 chronic kidney disease, or unspecified chronic kidney disease: Secondary | ICD-10-CM | POA: Diagnosis not present

## 2017-01-11 DIAGNOSIS — E871 Hypo-osmolality and hyponatremia: Secondary | ICD-10-CM | POA: Diagnosis not present

## 2017-01-11 DIAGNOSIS — E039 Hypothyroidism, unspecified: Secondary | ICD-10-CM | POA: Diagnosis not present

## 2017-01-11 DIAGNOSIS — Z681 Body mass index (BMI) 19 or less, adult: Secondary | ICD-10-CM | POA: Diagnosis not present

## 2017-01-11 DIAGNOSIS — R634 Abnormal weight loss: Secondary | ICD-10-CM | POA: Diagnosis not present

## 2017-01-11 DIAGNOSIS — E43 Unspecified severe protein-calorie malnutrition: Secondary | ICD-10-CM | POA: Diagnosis not present

## 2017-01-13 NOTE — Assessment & Plan Note (Signed)
She was recently hospitalized for severe depression with psychotic features and discharged on zyprexa and an increased dose of imipramine. Since discharge her family feels that her symptoms of slowed speech, guilt, and decreased interest have improved. She has become mor interactive and returned to perfoming her ADLs of eating, taking her medications, and ironing her clothes. Earlier this week she did have thoughts  that she may do something to harm her daughter but says that she did not have a plan to do so. These thoughts were disturbing to the pt and her daughter says that the pt communicated this to her. Since the change in medications she denies suicidal ideation, sedation, sx of orthostatic hypotension, or changes in movement.They have an appointment to see Dr. Geanie Kenning a geripsych tomorrow.   Reported improvement in symptoms of depression.  -Continue imipramine 10 mg daily -Continue Zyprexa

## 2017-01-13 NOTE — Assessment & Plan Note (Signed)
/  Pt oral BP meds were changed when she was unwilling to swallow during hospitalization for severe depression from valsartan to clonidine 0.3mg  patch and amlodipine 10 mg daily. Since this admission she has resumed taking medications by mouth. At last visit she was told to resume taking metoprolol but her daughter decided not to restart this medication yet because she had some morning blood pressure readings of 120/80.  I think she would benefit from being weaned from clonidine patch back to po anti-htn meds. Will initiate that wean today and restart metoprolol as her blood pressure is elevated today .  - Prescribed clonidine 0.2 mg patch  - Restart metoprolol 12.5 mg daily qHS -Continue amlodipine 10 mg daily

## 2017-01-13 NOTE — Progress Notes (Signed)
Internal Medicine Clinic Attending  Case discussed with Dr. Hetty Ely at the time of the visit.  We reviewed the resident's history and exam and pertinent patient test results.  I agree with the assessment, diagnosis, and plan of care documented in the resident's note. The BB is needed 2/2 CAD. It had been held in hospital as it may cause depression. Clonidine started in hospital since pt not taking PO. Would like to wean offf clonidine and resume ARB since pt has DM.

## 2017-01-14 DIAGNOSIS — Z681 Body mass index (BMI) 19 or less, adult: Secondary | ICD-10-CM | POA: Diagnosis not present

## 2017-01-14 DIAGNOSIS — E871 Hypo-osmolality and hyponatremia: Secondary | ICD-10-CM | POA: Diagnosis not present

## 2017-01-14 DIAGNOSIS — I130 Hypertensive heart and chronic kidney disease with heart failure and stage 1 through stage 4 chronic kidney disease, or unspecified chronic kidney disease: Secondary | ICD-10-CM | POA: Diagnosis not present

## 2017-01-14 DIAGNOSIS — E039 Hypothyroidism, unspecified: Secondary | ICD-10-CM | POA: Diagnosis not present

## 2017-01-14 DIAGNOSIS — R634 Abnormal weight loss: Secondary | ICD-10-CM | POA: Diagnosis not present

## 2017-01-14 DIAGNOSIS — E43 Unspecified severe protein-calorie malnutrition: Secondary | ICD-10-CM | POA: Diagnosis not present

## 2017-01-15 ENCOUNTER — Encounter: Payer: Self-pay | Admitting: Internal Medicine

## 2017-01-15 ENCOUNTER — Ambulatory Visit (INDEPENDENT_AMBULATORY_CARE_PROVIDER_SITE_OTHER): Payer: Medicare Other | Admitting: Internal Medicine

## 2017-01-15 VITALS — BP 114/45 | HR 72 | Temp 97.8°F | Ht 64.0 in | Wt 105.9 lb

## 2017-01-15 DIAGNOSIS — Z7984 Long term (current) use of oral hypoglycemic drugs: Secondary | ICD-10-CM

## 2017-01-15 DIAGNOSIS — F333 Major depressive disorder, recurrent, severe with psychotic symptoms: Secondary | ICD-10-CM

## 2017-01-15 DIAGNOSIS — E119 Type 2 diabetes mellitus without complications: Secondary | ICD-10-CM | POA: Diagnosis not present

## 2017-01-15 DIAGNOSIS — I10 Essential (primary) hypertension: Secondary | ICD-10-CM | POA: Diagnosis not present

## 2017-01-15 DIAGNOSIS — Z79899 Other long term (current) drug therapy: Secondary | ICD-10-CM | POA: Diagnosis not present

## 2017-01-15 MED ORDER — IMIPRAMINE HCL 50 MG PO TABS: 50.0000 mg | ORAL_TABLET | Freq: Two times a day (BID) | ORAL | 11 refills | Status: DC

## 2017-01-15 MED ORDER — MIRTAZAPINE 7.5 MG PO TABS: 7.5000 mg | ORAL_TABLET | Freq: Every day | ORAL | 2 refills | Status: DC

## 2017-01-15 NOTE — Patient Instructions (Addendum)
You're doing a great job Mrs. Christine [name]! Continue taking all of your medications. I encourage you to increase your fluid intake and feel free to eat foods that you enjoying. Continue your Zyprexa, imipramine, Remeron as prescribed and please follow-up with Dr. Ronnald Ramp on Monday.  Continue Clonidine 0.2 mg patch until 01/29/17 then start the 0.1 mg patch on 01/30/17.   Follow-up in our clinic in one week.

## 2017-01-15 NOTE — Progress Notes (Signed)
CC: Follow-up for depression  HPI: [name] is a 83 y.o. female with PMHx of severe depression with psychotic features, hypertension, type 2 diabetes who presents to the clinic for follow-up for depression.  Patient is accompanied by her daughter today. They both state that she is doing well. She recently saw Dr. Geanie Kenning who is a jury psychiatrist. The psychiatrist increased her imipramine to 50 mg twice a day, continued her Zyprexa, and added Remeron 7.5 mg daily at bedtime. Daughter has noted a great improvement in the patient. She is performing more of her ADLs, talking more, eating more, and overall more active. They have a follow-up appointment with Dr. Ronnald Ramp on Monday.  For her blood pressure, 1 week ago her clonidine patch was tapered from 0.3 mg to 0.2 mg. Her metoprolol was restarted at 12.5 mg daily and her amlodipine 10 mg daily was continued. Today, she is normotensive. We will plan to continue the clonidine patch at 0.2 mg until March 14th. Then start clonidine patch at 0.1 mg on March 15 for 3 weeks, then hopefully stop.  Patient has been compliant with glimepiride 1 mg daily. They deny any hypoglycemic events. Her glucose has ranged from 107 to 170s. She has not regained her full appetite, but it is improving.  Patient denies any headache, chest pain, shortness of breath.  Past Medical History:  Diagnosis Date  . Anemia   . Anginal pain ([place])   . Arthritis    "back, arms, hips; hands" (11/30/2015)  . Benign hypertensive heart and kidney disease with diastolic CHF, NYHA class II and CKD stage III ([place])   . Blood transfusion 1972   "after daughter born, attempted to give me blood; couldn't give it cause my blood was cold" (09/23/2013)  . CAD (coronary artery disease)    a. LHC 08/23/11: dLM 40-50%, oRI 40%, oCFX 40%, oD1 70% (small and not amenable to PCI).  LM lesion did not appear to be flow limiting.  Medical rx was recommended;  b. Echo 08/23/11: mild LVH, EF  60-65%, grade 1 diast dysfxn, mild BAE, PASP 24;  c. 02/2012  Cath: LM 50d, LAD 50p, D1 70ost, RI 70p, RCA ok->Med Rx;  d. 01/2013 Cardiolite: EF 88, no ischemia/infarct.  . Carotid stenosis    a. dopplers 10/12:  0-39% bilat ICA;  b. 10/2012 U/S: 0-39% bilat, f/u 1 yr (10/2013).  . Chronic bronchitis   . Depression   . Diverticulosis of colon (without mention of hemorrhage)   . GERD (gastroesophageal reflux disease)   . Heart murmur, systolic    2-D echo in December 2011 showed a normal EF with grade 1 diastolic dysfunction, trivial pulmonary regurgitation and mildly elevated PA pressure at 37 mmHg probably secondary to her COPD.dynamic obstruction-mid cavity obliteration;  b. 02/2012 Echo: EF 60-65%, mild LVH, PASP 80mmHg.  Marland Kitchen History of stomach ulcers 1970's  . Hyperlipidemia   . Hypertension   . Hypothyroidism   . Migraines    a. Next atypical symptoms in the past. Patient was started on Neurontin for possible neuropathic origin of her pain.  . Polymyalgia rheumatica ([place])   . Shortness of breath on exertion    "just related to angina >1 yr ago" (09/23/2013)  . Sinus arrhythmia   . Type II diabetes mellitus ([place])    a. On oral hypoglycemic agents.     Review of Systems: Please see pertinent ROS reviewed in HPI and problem based charting.   Physical Exam: Vitals:  01/15/17 0942  BP: (!) 158/65  Pulse: 85  Temp: 97.8 F (36.6 C)  TempSrc: Oral  SpO2: 100%  Weight: 105 lb 14.4 oz (48 kg)  Height: 5\' 4"  (1.626 m)   General: Vital signs reviewed.  Patient is Elderly, thin-appearing, in no acute distress and cooperative with exam.  Cardiovascular: RRR Pulmonary/Chest: Clear to auscultation bilaterally, no wheezes, rales, or rhonchi. Abdominal: Soft, non-tender, non-distended, BS + Extremities: No lower extremity edema bilaterally Skin: Warm, dry and intact. No rashes or erythema. Psychiatric: Reserved, quiet, flat mood and affect, but improving. speech and behavior is normal.     Assessment & Plan:  See encounters tab for problem based medical decision making. Patient discussed with Dr. Eppie Gibson

## 2017-01-15 NOTE — Assessment & Plan Note (Signed)
Patient is accompanied by her daughter today. They both state that she is doing well. She recently saw Dr. Geanie Kenning who is a jury psychiatrist. The psychiatrist increased her imipramine to 50 mg twice a day, continued her Zyprexa, and added Remeron 7.5 mg daily at bedtime. Daughter has noted a great improvement in the patient. She is performing more of her ADLs, talking more, eating more, and overall more active. They have a follow-up appointment with Dr. Ronnald Ramp on Monday.  Assessment: Major depressive disorder with psychosis  Plan: -Continue imipramine 50 mg twice a day -Continue Zyprexa -Continue Remeron 7.5 mg daily at bedtime -Follow-up with Dr. Ronnald Ramp on Monday

## 2017-01-15 NOTE — Progress Notes (Signed)
Case discussed with Dr. Burns soon after the resident saw the patient. We reviewed the resident's history and exam and pertinent patient test results. I agree with the assessment, diagnosis, and plan of care documented in the resident's note. 

## 2017-01-15 NOTE — Assessment & Plan Note (Addendum)
Patient has been compliant with glimepiride 1 mg daily. They deny any hypoglycemic events. Her glucose has ranged from 107 to 170s. She has not regained her full appetite, but it is improving. A1c was 7.52 weeks ago.  Assessment: Type 2 diabetes  Plan: -Continue glimepiride 1 mg daily -Encouraged increased food intake

## 2017-01-15 NOTE — Assessment & Plan Note (Signed)
For her blood pressure, 1 week ago her clonidine patch was tapered from 0.3 mg to 0.2 mg. Her metoprolol was restarted at 12.5 mg daily and her amlodipine 10 mg daily was continued. Today, she is normotensive. We will plan to continue the clonidine patch at 0.2 mg until March 14th. Then start clonidine patch at 0.1 mg on March 15 for 3 weeks, then hopefully stop.   Assessment: Hypertension  Plan: -Continue amlodipine 10 mg daily -Continue the Toprol 12.5 mg daily -Continue clonidine patch at 0.2 mg until March 14. It didn't start clonidine patch at 0.1 mg on March 15. Continue for 3 weeks, then stop

## 2017-01-16 ENCOUNTER — Telehealth: Payer: Self-pay

## 2017-01-16 DIAGNOSIS — R634 Abnormal weight loss: Secondary | ICD-10-CM | POA: Diagnosis not present

## 2017-01-16 DIAGNOSIS — I130 Hypertensive heart and chronic kidney disease with heart failure and stage 1 through stage 4 chronic kidney disease, or unspecified chronic kidney disease: Secondary | ICD-10-CM | POA: Diagnosis not present

## 2017-01-16 DIAGNOSIS — E039 Hypothyroidism, unspecified: Secondary | ICD-10-CM | POA: Diagnosis not present

## 2017-01-16 DIAGNOSIS — Z681 Body mass index (BMI) 19 or less, adult: Secondary | ICD-10-CM | POA: Diagnosis not present

## 2017-01-16 DIAGNOSIS — E871 Hypo-osmolality and hyponatremia: Secondary | ICD-10-CM | POA: Diagnosis not present

## 2017-01-16 DIAGNOSIS — E43 Unspecified severe protein-calorie malnutrition: Secondary | ICD-10-CM | POA: Diagnosis not present

## 2017-01-16 NOTE — Telephone Encounter (Signed)
[facility] from [facility] states pt do not need further OT.

## 2017-01-17 DIAGNOSIS — R634 Abnormal weight loss: Secondary | ICD-10-CM | POA: Diagnosis not present

## 2017-01-17 DIAGNOSIS — E43 Unspecified severe protein-calorie malnutrition: Secondary | ICD-10-CM | POA: Diagnosis not present

## 2017-01-17 DIAGNOSIS — E871 Hypo-osmolality and hyponatremia: Secondary | ICD-10-CM | POA: Diagnosis not present

## 2017-01-17 DIAGNOSIS — Z681 Body mass index (BMI) 19 or less, adult: Secondary | ICD-10-CM | POA: Diagnosis not present

## 2017-01-17 DIAGNOSIS — E039 Hypothyroidism, unspecified: Secondary | ICD-10-CM | POA: Diagnosis not present

## 2017-01-17 DIAGNOSIS — I130 Hypertensive heart and chronic kidney disease with heart failure and stage 1 through stage 4 chronic kidney disease, or unspecified chronic kidney disease: Secondary | ICD-10-CM | POA: Diagnosis not present

## 2017-01-17 NOTE — Telephone Encounter (Signed)
Lm for rtc 

## 2017-01-20 DIAGNOSIS — F332 Major depressive disorder, recurrent severe without psychotic features: Secondary | ICD-10-CM | POA: Diagnosis not present

## 2017-01-21 DIAGNOSIS — E039 Hypothyroidism, unspecified: Secondary | ICD-10-CM | POA: Diagnosis not present

## 2017-01-21 DIAGNOSIS — I130 Hypertensive heart and chronic kidney disease with heart failure and stage 1 through stage 4 chronic kidney disease, or unspecified chronic kidney disease: Secondary | ICD-10-CM | POA: Diagnosis not present

## 2017-01-21 DIAGNOSIS — Z681 Body mass index (BMI) 19 or less, adult: Secondary | ICD-10-CM | POA: Diagnosis not present

## 2017-01-21 DIAGNOSIS — E871 Hypo-osmolality and hyponatremia: Secondary | ICD-10-CM | POA: Diagnosis not present

## 2017-01-21 DIAGNOSIS — E43 Unspecified severe protein-calorie malnutrition: Secondary | ICD-10-CM | POA: Diagnosis not present

## 2017-01-21 DIAGNOSIS — R634 Abnormal weight loss: Secondary | ICD-10-CM | POA: Diagnosis not present

## 2017-01-22 ENCOUNTER — Ambulatory Visit: Payer: Self-pay

## 2017-01-23 ENCOUNTER — Ambulatory Visit (INDEPENDENT_AMBULATORY_CARE_PROVIDER_SITE_OTHER): Payer: Medicare Other | Admitting: Internal Medicine

## 2017-01-23 ENCOUNTER — Encounter: Payer: Self-pay | Admitting: Internal Medicine

## 2017-01-23 VITALS — BP 130/65 | HR 82 | Temp 97.7°F | Ht 64.0 in | Wt 109.8 lb

## 2017-01-23 DIAGNOSIS — Z20828 Contact with and (suspected) exposure to other viral communicable diseases: Secondary | ICD-10-CM

## 2017-01-23 DIAGNOSIS — R0989 Other specified symptoms and signs involving the circulatory and respiratory systems: Secondary | ICD-10-CM | POA: Diagnosis present

## 2017-01-23 MED ORDER — BENZOCAINE-MENTHOL 6-10 MG MT LOZG: 1.0000 | LOZENGE | OROMUCOSAL | 0 refills | Status: DC | PRN

## 2017-01-23 MED ORDER — OSELTAMIVIR PHOSPHATE 75 MG PO CAPS: 75.0000 mg | ORAL_CAPSULE | Freq: Every day | ORAL | 0 refills | Status: AC

## 2017-01-23 NOTE — Progress Notes (Signed)
   CC: Flu exposure  HPI:  [name] is a 83 y.o. with past medical history as outlined below who presents to clinic due to flu exposure. Please see problem was further details.  Past Medical History:  Diagnosis Date  . Anemia   . Anginal pain ([place])   . Arthritis    "back, arms, hips; hands" (11/30/2015)  . Benign hypertensive heart and kidney disease with diastolic CHF, NYHA class II and CKD stage III ([place])   . Blood transfusion 1972   "after daughter born, attempted to give me blood; couldn't give it cause my blood was cold" (09/23/2013)  . CAD (coronary artery disease)    a. LHC 08/23/11: dLM 40-50%, oRI 40%, oCFX 40%, oD1 70% (small and not amenable to PCI).  LM lesion did not appear to be flow limiting.  Medical rx was recommended;  b. Echo 08/23/11: mild LVH, EF 60-65%, grade 1 diast dysfxn, mild BAE, PASP 24;  c. 02/2012  Cath: LM 50d, LAD 50p, D1 70ost, RI 70p, RCA ok->Med Rx;  d. 01/2013 Cardiolite: EF 88, no ischemia/infarct.  . Carotid stenosis    a. dopplers 10/12:  0-39% bilat ICA;  b. 10/2012 U/S: 0-39% bilat, f/u 1 yr (10/2013).  . Chronic bronchitis   . Depression   . Diverticulosis of colon (without mention of hemorrhage)   . GERD (gastroesophageal reflux disease)   . Heart murmur, systolic    2-D echo in December 2011 showed a normal EF with grade 1 diastolic dysfunction, trivial pulmonary regurgitation and mildly elevated PA pressure at 37 mmHg probably secondary to her COPD.dynamic obstruction-mid cavity obliteration;  b. 02/2012 Echo: EF 60-65%, mild LVH, PASP 61mmHg.  Marland Kitchen History of stomach ulcers 1970's  . Hyperlipidemia   . Hypertension   . Hypothyroidism   . Migraines    a. Next atypical symptoms in the past. Patient was started on Neurontin for possible neuropathic origin of her pain.  . Polymyalgia rheumatica ([place])   . Shortness of breath on exertion    "just related to angina >1 yr ago" (09/23/2013)  . Sinus arrhythmia   . Type II diabetes mellitus ([place])     a. On oral hypoglycemic agents.    Review of Systems:  Positive for scratchy throat. Denies muscle aches, decreased appetite, nausea, vomiting, abdominal pain, fevers.  Physical Exam:  Vitals:   01/23/17 1435  BP: 130/65  Pulse: 82  Temp: 97.7 F (36.5 C)  TempSrc: Oral  Weight: 109 lb 12.8 oz (49.8 kg)  Height: 5\' 4"  (1.626 m)   Physical Exam  Constitutional: appears well-developed and well-nourished. No distress.  HENT:  Head: Normocephalic and atraumatic. Negative for erythema or tonsillar exudates. Nose: Nose normal.  Cardiovascular: Normal rate, regular rhythm and normal heart sounds.  Exam reveals no gallop and no friction rub.   No murmur heard. Pulmonary/Chest: Effort normal and breath sounds normal. No respiratory distress.  has no wheezes.no rales.  Neurological: alert and oriented to person, place, and time.  Skin: Skin is warm and dry. No rash noted.  not diaphoretic. No erythema. No pallor.   Assessment & Plan:   See Encounters Tab for problem based charting.  Patient discussed with Dr. Dareen Piano

## 2017-01-23 NOTE — Assessment & Plan Note (Signed)
Assessment: Patient's grandson whom she lives with was recently diagnosed with a flu. She is having a scratchy throat. Otherwise is asymptomatic.  Plan: Due to age greater than 21 and multiple comorbidities will give flu prophylaxis with Tamiflu 75 mg once daily for 10 days. Given prescription for throat lozenges as well.

## 2017-01-23 NOTE — Patient Instructions (Signed)
Start taking tamiflu 75mg  daily for 10 days.

## 2017-01-23 NOTE — Telephone Encounter (Signed)
Patient's daughter states patient is having sore throat & cough & "just not feeling well" . A family member that lives with patient has been diagnosed with flu recently. Appt with [facility] today @ 2:15pm.

## 2017-01-23 NOTE — Progress Notes (Signed)
Internal Medicine Clinic Attending  Case discussed with Dr. Truong at the time of the visit.  We reviewed the resident's history and exam and pertinent patient test results.  I agree with the assessment, diagnosis, and plan of care documented in the resident's note.  

## 2017-01-24 ENCOUNTER — Telehealth: Payer: Self-pay

## 2017-01-24 NOTE — Telephone Encounter (Signed)
Pt daughter needs to speak with a nurse about bp med. Please call back.

## 2017-01-24 NOTE — Telephone Encounter (Signed)
Called daughter - stated with Clonidine patch,she had been putting on the cover by mistake instead of the actual medication patch. Last time she had the patch was 2/19. BP this am was 145/68. Also stated doctor suppose to be weaning pt off. She did not put on clonidine patch this morning. Please advise.

## 2017-01-24 NOTE — Telephone Encounter (Signed)
Discontinue patch altogether.  Please make sure she is scheduled to be seen for her BP in 1 week.  Thank you

## 2017-01-24 NOTE — Telephone Encounter (Signed)
Daughter informed to stop Clonidine patch altogether per Dr Daryll Drown  And pt has an appt on Wed in [facility]. Daughter awared.

## 2017-01-28 NOTE — Progress Notes (Signed)
Cardiology Office Note:    Date:  01/29/2017   ID:  Christine [name], DOB 01/24/1934, MRN [id number]  PCP:  Gilles Chiquito, MD  Cardiologist:  Dr. Loralie Champagne >> Dr. Skeet Latch  Electrophysiologist:  n/a  Referring MD: [name], MD   Chief Complaint  Patient presents with  . Follow-up    CAD, HTN    History of Present Illness:    Christine [name] is a 83 y.o. female with a hx of CAD, HTN, DM.  She was initially seen in 10/12 for chest pain in the hospital.  LHC showed dLM 40-50, Dx 75.  Med rx was recommended.  She was admitted again in 4/13 with chest pain and LHC demonstrated stable anatomy.  Dobutamine Echo was submaximal but did not show ischemia.  She had chest pain again in 2014.  Lexiscan CLite was normal in 3/14.  She went to the ED in 10/16 with shortness of breath.  BNP was normal.  CLite demonstrated no ischemia.  LHC/RHC demonstrated stable anatomy and normal filling pressures. CPX in 12/16 demonstrated chronotropic incompetence and beta-blocker was reduced.  In 2017, she had orthostatic hypotension.  Compression stockings were recommended and she was placed on pyridostigmine.  She was not tolerant of this due to dizziness.  At last visit with Dr. Loralie Champagne in 12/17, her symptoms were better.    She was admitted several times in January due to worsening functional status at home which stemmed from worsening depression after her sister passed away.  Last admission 1/31-2/13. She presented with hypoglycemia and hypotension due to poor oral intake in the setting of diabetes. She was evaluated by psychiatry. At discharge, she was left off of clonidine, metoprolol succinate and valsartan.  She is here today with her daughter. She has been living at home with her. Her mood has improved significantly. She is doing more activities of daily living. She worked with physical therapy at home and has been discharged. She denies any exertional chest discomfort. She denies  significant dyspnea. She denies orthopnea, PND or edema. She denies syncope.  Prior CV studies:   The following studies were reviewed today:  Holter 5/17 Occasional PACs and PVCs, no worrisome arrhythmias.  CPX 12/16 Conclusion: Exercise testing with gas exchange demonstrates a mild to moderate functional impairment when compared to matched sedentary norms. Pre-exercise spirometry suggests mild obstruction. There appears to be a significant circulatory limitation with severe chronotropic incompetence, a low OUES and a markedly elevated VeVCO22 slope. The VE/VCO2 slope is suggestive of high filling pressures but recent RHC was normal.  The possibility of exercise-induced PAH or functional MR is raised. Myocardial ischemia cannot be excluded. If symptoms do not improve with a formal exercise training program consider exercise RHC.   R/LHC 11/16 LM distal 40-50 LAD irregs; ostial D1 70-80 LCx ostial 30 RCA irregs PA 28/5, PCWP mean 5, RA mean 2 EF 70 1. Low filling pressures, no evidence for CHF as cause of her dyspnea.  2. Stable coronary disease.  The 40-50% left main stenosis and the 70-80% D1 stenosis do not appear to have progressed compared to 2013 angiography.   3. I cannot explain her exertional symptoms on the basis of this procedure.  Would consider CPX.  Echo 11/16 Mod LVH, EF 60-65, no RWMA, normal diastolic function  Myocardial Perfusion Imaging Study 10/16 EF 82.  Normal pharmacologic stress test with no infarct and no ischemia.   ABI 4/15 Biphasic pedal waveforms with normal ABIs.  Myocardial Perfusion Imaging Study 3/14 Normal study, EF 88  Carotid US 12/13 bilat ICA 0-30 - f/u 1 year  Dobutamine-Echo 4/13 Dobutamine echo with chest pain but no ST changes; hypotensive response to dobutamine infusion; patient didnot achieve THR; no stress-induced wall motionabnormalities.  Echo 4/13 Mild LVH, EF 60-65, no RWMA, Gr 1 DD  Past Medical History:  Diagnosis  Date  . Anemia   . Anginal pain ([place])   . Arthritis    "back, arms, hips; hands" (11/30/2015)  . Benign hypertensive heart and kidney disease with diastolic CHF, NYHA class II and CKD stage III ([place])   . Blood transfusion 1972   "after daughter born, attempted to give me blood; couldn't give it cause my blood was cold" (09/23/2013)  . CAD (coronary artery disease)    a. LHC 08/23/11: dLM 40-50%, oRI 40%, oCFX 40%, oD1 70% (small and not amenable to PCI).  LM lesion did not appear to be flow limiting.  Medical rx was recommended;  b. Echo 08/23/11: mild LVH, EF 60-65%, grade 1 diast dysfxn, mild BAE, PASP 24;  c. 02/2012  Cath: LM 50d, LAD 50p, D1 70ost, RI 70p, RCA ok->Med Rx;  d. 01/2013 Cardiolite: EF 88, no ischemia/infarct.  . Carotid stenosis    a. dopplers 10/12:  0-39% bilat ICA;  b. 10/2012 U/S: 0-39% bilat, f/u 1 yr (10/2013).  . Chronic bronchitis   . Depression   . Diverticulosis of colon (without mention of hemorrhage)   . GERD (gastroesophageal reflux disease)   . Heart murmur, systolic    2-D echo in December 2011 showed a normal EF with grade 1 diastolic dysfunction, trivial pulmonary regurgitation and mildly elevated PA pressure at 37 mmHg probably secondary to her COPD.dynamic obstruction-mid cavity obliteration;  b. 02/2012 Echo: EF 60-65%, mild LVH, PASP 6mmHg.  Marland Kitchen History of stomach ulcers 1970's  . Hyperlipidemia   . Hypertension   . Hypothyroidism   . Migraines    a. Next atypical symptoms in the past. Patient was started on Neurontin for possible neuropathic origin of her pain.  . Polymyalgia rheumatica ([place])   . Shortness of breath on exertion    "just related to angina >1 yr ago" (09/23/2013)  . Sinus arrhythmia   . Type II diabetes mellitus ([place])    a. On oral hypoglycemic agents.  1. CAD: Chest pain 10/12.  LHC with 40-50% distal LM stenosis, 70-75% ostial D1.  No flow-limiting lesions, medically managed.  Echo (10/12): EF 60-65%, mild LV hypertrophy, PA systolic  pressure 24 mmHg.  Chest pain 4/13.  LHC with 50% distal LM, 70% ostial D1, 50% proximal LAD, 70% proximal ramus.  No flow-limiting lesions, medically managed.  Echo (4/13) with EF 60-65%, mild LVH.  DSE (4/13) with no ischemia but it was a submaximal study.  Lexiscan Cardiolite (3/14) with EF 88%, no ischemia or infarction.  Lexiscan Cardiolite (10/16) with EF 82%, no ischemia/infarction.  Echo (11/16) with EF 60-65%, moderate LVH.  - LHC/RHC 11/16: 40-50% dLM, 70-80% D1 (no change to coronaries from 2013); mean RA 2, PA 28/5, mean PCWP 5, CI 2.28.  - CPX (12/16): peak VO2 10.6, VE/VCO2 slope 42.3, RER 1.1.  Marked high VE/VCO2 slope suggested elevated LV filling pressure but RHC ok.  Severe chronotropic incompetence, mild obstruction on PFTs.  2. DM2 3. HTN 4. Hyperlipidemia 5. CKD 6. Carotid dopplers (10/12): 0-39% bilateral carotid stenosis.  7. Allergy to aspirin => rash.  8. GERD 9. Migraines 10. Hypothyroidism 11. COPD 26.  PMR: Dr. Charlestine Night.  13. ABIs (4/15): Normal 14. Sciatica 15. Holter (5/17): occasional PACs/PVCs, no atrial fibrillation, lower HR 47.   16. Orthostatic hypotension  Past Surgical History:  Procedure Laterality Date  . CARDIAC CATHETERIZATION N/A 09/26/2015   Procedure: Right/Left Heart Cath and Coronary Angiography;  Surgeon: Larey Dresser, MD;  Location: [facility];  Service: Cardiovascular;  Laterality: N/A;  . CATARACT EXTRACTION W/ INTRAOCULAR LENS  IMPLANT, BILATERAL Bilateral 1990's  . LEFT HEART CATHETERIZATION WITH CORONARY ANGIOGRAM N/A 03/16/2012   Procedure: LEFT HEART CATHETERIZATION WITH CORONARY ANGIOGRAM;  Surgeon: Hillary Bow, MD;  Location: [facility] CATH [facility];  Service: Cardiovascular;  Laterality: N/A;  . VAGINAL HYSTERECTOMY  1976    Current Medications: Current Meds  Medication Sig  . acetaminophen (TYLENOL) 500 MG tablet Take 1,000 mg by mouth every 6 (six) hours as needed for headache. For pain  . ADVAIR DISKUS 250-50 MCG/DOSE  AEPB inhale 1 dose by mouth every 12 hours  . amLODipine (NORVASC) 10 MG tablet Take 1 tablet (10 mg total) by mouth daily.  . Ascorbic Acid (VITAMIN C PO) Take 1 tablet by mouth daily.   Marland Kitchen atorvastatin (LIPITOR) 40 MG tablet Take 0.5 tablets (20 mg total) by mouth every evening.  . benzocaine-menthol (SORE THROAT LOZENGES) 6-10 MG lozenge Take 1 lozenge by mouth as needed for sore throat.  . clopidogrel (PLAVIX) 75 MG tablet take 1 tablet by mouth every evening  . esomeprazole (NEXIUM) 40 MG capsule take 1 capsule by mouth once daily BEFORE BREAKFAST  . feeding supplement, GLUCERNA SHAKE, (GLUCERNA SHAKE) LIQD Take 237 mLs by mouth 3 (three) times daily between meals.  Marland Kitchen glucose blood test strip Use as instructed  . hydroxypropyl methylcellulose (ISOPTO TEARS) 2.5 % ophthalmic solution Place 1 drop into both eyes at bedtime.   Marland Kitchen imipramine (TOFRANIL) 50 MG tablet Take 1 tablet (50 mg total) by mouth 2 (two) times daily.  Marland Kitchen levothyroxine (SYNTHROID, LEVOTHROID) 25 MCG tablet take 1 tablet by mouth once daily  . metoprolol succinate (TOPROL-XL) 25 MG 24 hr tablet Take 0.5 tablets (12.5 mg total) by mouth daily.  . mirtazapine (REMERON) 7.5 MG tablet Take 1 tablet (7.5 mg total) by mouth at bedtime.  . Multiple Vitamin (MULTIVITAMIN WITH MINERALS) TABS tablet Take 1 tablet by mouth daily.  Marland Kitchen NITROSTAT 0.4 MG SL tablet place 1 tablet under the tongue if needed every 5 minutes for chest pain for 3 doses IF NO RELIEF AFTER 3RD DOSE CALL PRESCRIBER OR 911.  Marland Kitchen OLANZapine zydis (ZYPREXA) 5 MG disintegrating tablet Take 0.5 tablets (2.5 mg total) by mouth at bedtime.  Marland Kitchen oseltamivir (TAMIFLU) 75 MG capsule Take 1 capsule (75 mg total) by mouth daily.  . polycarbophil (FIBERCON) 625 MG tablet Take 1 tablet (625 mg total) by mouth daily.  . polyethylene glycol (MIRALAX / GLYCOLAX) packet Take 17 g by mouth daily as needed.  . senna (SENOKOT) 8.6 MG TABS tablet Take 1 tablet (8.6 mg total) by mouth at bedtime  as needed for mild constipation.     Allergies:   Aspirin; Atarax [hydroxyzine]; Nsaids; Codeine; Haldol [haloperidol]; Other; and Penicillins   Social History   Social History  . Marital status: Widowed    Spouse name: N/A  . Number of children: N/A  . Years of education: N/A   Social History Main Topics  . Smoking status: Never Smoker  . Smokeless tobacco: Never Used  . Alcohol use No     Comment: 11/29/2016 "  Last drink 1967"  . Drug use: No  . Sexual activity: Not Currently   Other Topics Concern  . None   Social History Narrative   Never smoked. Lives in [place] with her dtr.  Care for her daughter who has had a lung transplant. Retired Building surveyor.     Family History  Problem Relation Age of Onset  . Colon cancer Maternal Grandmother   . Heart attack Maternal Grandmother   . COPD Father   . Other Mother     died of unknown causes in her 24's. Pt raised by grandmother.  . Pulmonary Hypertension Daughter      ROS:   Please see the history of present illness.    Review of Systems  Psychiatric/Behavioral: Positive for depression.   All other systems reviewed and are negative.   EKGs/Labs/Other Test Reviewed:    EKG:  EKG is  ordered today.  The ekg ordered today demonstrates NSR, HR 87, LAD, nonspecific ST-T wave changes, QTc 471, PAC  Recent Labs: 12/25/2016: ALT 25; Magnesium 1.8; TSH 1.582 12/30/2016: Hemoglobin 11.0; Platelets 272 01/02/2017: BUN 11; Creatinine, Ser 0.91; Potassium 4.1; Sodium 140   Recent Lipid Panel    Component Value Date/Time   CHOL 136 05/08/2016 0957   TRIG 207 (H) 05/08/2016 0957   HDL 40 05/08/2016 0957   CHOLHDL 3.4 05/08/2016 0957   CHOLHDL 4 08/05/2014 0933   VLDL 33.8 08/05/2014 0933   LDLCALC 55 05/08/2016 0957     Physical Exam:    VS:  BP (!) 140/50   Pulse 87   Ht 5\' 4"  (1.626 m)   Wt 110 lb 1.9 oz (50 kg)   LMP 02/11/1975   BMI 18.90 kg/m     Wt Readings from Last 3 Encounters:  01/29/17 110 lb 1.9 oz (50  kg)  01/23/17 109 lb 12.8 oz (49.8 kg)  01/15/17 105 lb 14.4 oz (48 kg)     Physical Exam  Constitutional: She is oriented to person, place, and time. She appears well-developed. She appears cachectic. No distress.  HENT:  Head: Normocephalic and atraumatic.  Eyes: No scleral icterus.  Neck: Normal range of motion. No JVD present.  Cardiovascular: Normal rate, regular rhythm, S1 normal and S2 normal.   Murmur heard.  Medium-pitched systolic murmur is present with a grade of 2/6  at the upper right sternal border Pulmonary/Chest: Breath sounds normal. She has no wheezes. She has no rhonchi. She has no rales.  Abdominal: Soft. There is no tenderness.  Musculoskeletal: She exhibits no edema.  Neurological: She is alert and oriented to person, place, and time.  Skin: Skin is warm and dry.  Psychiatric: She has a normal mood and affect.    ASSESSMENT:    1. Coronary artery disease involving native coronary artery of native heart with angina pectoris ([place])   2. Essential hypertension   3. Pure hypercholesterolemia    PLAN:    In order of problems listed above:  1. Coronary artery disease involving native coronary artery of native heart with angina pectoris [facility]) -  She has a history of chest discomfort with nonflow limiting disease by cardiac catheterization in 4/13 and repeat cardiac catheterization 11/16 with 40-50% distal left main stenosis. She has had improved symptoms on antianginal therapy. She has also had evidence of chronotropic incompetence by cardiac pulmonary stress testing with improvement after reduction in her beta blocker dose. Since discharge from her most recent hospitalizations for depression, she has been doing well. She  denies recurrent chest symptoms on her current medical therapy which includes amlodipine and metoprolol. These will be continued. Continue clopidogrel, Lipitor.  2. Essential hypertension -  Borderline control.  Continue to monitor.  If BP remains  161 systolic or higher as her diet returns to normal, she will call.  I would recommend resuming Valsartan at that point.   3. Pure hypercholesterolemia - Continue statin.   Dispo:  Return in about 3 months (around 05/01/2017) for Routine Follow Up with Dr. Oval Linsey.   Medication Adjustments/Labs and Tests Ordered: Current medicines are reviewed at length with the patient today.  Concerns regarding medicines are outlined above.  Medication changes, Labs and Tests ordered today are outlined in the Patient Instructions noted below. Patient Instructions  Medication Instructions:  None ordered  Labwork: None ordered   Testing/Procedures: None ordered   Follow-Up: Your physician recommends that you schedule a follow-up appointment in: 05/01/2017 with Dr. Oval Linsey at 9:00am  Any Other Special Instructions Will Be Listed Below (If Applicable).  If you need a refill on your cardiac medications before your next appointment, please call your pharmacy.  Signed, Richardson Dopp, PA-C  01/29/2017 8:59 AM    [facility] [address], [place], [place]  [address] Phone: [phone]; Fax: [phone]

## 2017-01-29 ENCOUNTER — Ambulatory Visit (INDEPENDENT_AMBULATORY_CARE_PROVIDER_SITE_OTHER): Payer: Medicare Other | Admitting: Physician Assistant

## 2017-01-29 ENCOUNTER — Ambulatory Visit (INDEPENDENT_AMBULATORY_CARE_PROVIDER_SITE_OTHER): Payer: Medicare Other | Admitting: Internal Medicine

## 2017-01-29 ENCOUNTER — Encounter: Payer: Self-pay | Admitting: Physician Assistant

## 2017-01-29 VITALS — BP 140/50 | HR 87 | Ht 64.0 in | Wt 110.1 lb

## 2017-01-29 DIAGNOSIS — I10 Essential (primary) hypertension: Secondary | ICD-10-CM

## 2017-01-29 DIAGNOSIS — I25119 Atherosclerotic heart disease of native coronary artery with unspecified angina pectoris: Secondary | ICD-10-CM | POA: Diagnosis not present

## 2017-01-29 DIAGNOSIS — I209 Angina pectoris, unspecified: Secondary | ICD-10-CM | POA: Diagnosis not present

## 2017-01-29 DIAGNOSIS — R011 Cardiac murmur, unspecified: Secondary | ICD-10-CM

## 2017-01-29 DIAGNOSIS — I2510 Atherosclerotic heart disease of native coronary artery without angina pectoris: Secondary | ICD-10-CM | POA: Diagnosis not present

## 2017-01-29 DIAGNOSIS — E7800 Pure hypercholesterolemia, unspecified: Secondary | ICD-10-CM | POA: Diagnosis not present

## 2017-01-29 DIAGNOSIS — Z79899 Other long term (current) drug therapy: Secondary | ICD-10-CM

## 2017-01-29 NOTE — Progress Notes (Signed)
   CC: BP f/up  HPI:  [name] is a 83 y.o. with PMH as listed below is here for HTN f/up  I saw her briefly in the hospital last month and she is doing much better now with her mood. Denies any complaints today.   HTN: Recent tapered off clonidine patch, on metoprolol 12.5mg  daily and amlodipine 10mg  daily. Doing ok with this, BP 144/50, pulse 70. No dizziness or any other problems.    Past Medical History:  Diagnosis Date  . Anemia   . Anginal pain ([place])   . Arthritis    "back, arms, hips; hands" (11/30/2015)  . Benign hypertensive heart and kidney disease with diastolic CHF, NYHA class II and CKD stage III ([place])   . Blood transfusion 1972   "after daughter born, attempted to give me blood; couldn't give it cause my blood was cold" (09/23/2013)  . CAD (coronary artery disease)    a. LHC 08/23/11: dLM 40-50%, oRI 40%, oCFX 40%, oD1 70% (small and not amenable to PCI).  LM lesion did not appear to be flow limiting.  Medical rx was recommended;  b. Echo 08/23/11: mild LVH, EF 60-65%, grade 1 diast dysfxn, mild BAE, PASP 24;  c. 02/2012  Cath: LM 50d, LAD 50p, D1 70ost, RI 70p, RCA ok->Med Rx;  d. 01/2013 Cardiolite: EF 88, no ischemia/infarct.  . Carotid stenosis    a. dopplers 10/12:  0-39% bilat ICA;  b. 10/2012 U/S: 0-39% bilat, f/u 1 yr (10/2013).  . Chronic bronchitis   . Depression   . Diverticulosis of colon (without mention of hemorrhage)   . GERD (gastroesophageal reflux disease)   . Heart murmur, systolic    2-D echo in December 2011 showed a normal EF with grade 1 diastolic dysfunction, trivial pulmonary regurgitation and mildly elevated PA pressure at 37 mmHg probably secondary to her COPD.dynamic obstruction-mid cavity obliteration;  b. 02/2012 Echo: EF 60-65%, mild LVH, PASP 6mmHg.  Marland Kitchen History of stomach ulcers 1970's  . Hyperlipidemia   . Hypertension   . Hypothyroidism   . Migraines    a. Next atypical symptoms in the past. Patient was started on Neurontin for  possible neuropathic origin of her pain.  . Polymyalgia rheumatica ([place])   . Shortness of breath on exertion    "just related to angina >1 yr ago" (09/23/2013)  . Sinus arrhythmia   . Type II diabetes mellitus ([place])    a. On oral hypoglycemic agents.    Review of Systems:   Review of Systems  Cardiovascular: Negative for chest pain and palpitations.  Gastrointestinal: Negative for nausea and vomiting.  Neurological: Negative for dizziness.     Physical Exam:   Vitals:   01/29/17 0931  BP: (!) 144/50  Pulse: 70  Temp: 97.6 F (36.4 C)  TempSrc: Oral  SpO2: 100%  Weight: 111 lb 6.4 oz (50.5 kg)  Height: 5\' 4"  (1.626 m)   Physical Exam  Constitutional: She appears well-developed and well-nourished. No distress.  HENT:  Head: Normocephalic and atraumatic.  Cardiovascular: Normal rate and regular rhythm.   Systolic ejection murmur  Respiratory: Effort normal and breath sounds normal. No respiratory distress. She has no wheezes.  Musculoskeletal: Normal range of motion. She exhibits no edema.  Skin: She is not diaphoretic.    Assessment & Plan:   See Encounters Tab for problem based charting.  Patient discussed with Dr. Eppie Gibson

## 2017-01-29 NOTE — Progress Notes (Signed)
Case discussed with Dr. Ahmed at the time of the visit. We reviewed the resident's history and exam and pertinent patient test results. I agree with the assessment, diagnosis, and plan of care documented in the resident's note. 

## 2017-01-29 NOTE — Patient Instructions (Signed)
I am glad you are doing well.  Continue your current medication regimen. F/up with Dr. Daryll Drown.

## 2017-01-29 NOTE — Patient Instructions (Addendum)
Medication Instructions:  None ordered  Labwork: None ordered   Testing/Procedures: None ordered   Follow-Up: Your physician recommends that you schedule a follow-up appointment in: 05/01/2017 with Dr. Oval Linsey at 9:00am  Any Other Special Instructions Will Be Listed Below (If Applicable).  If you need a refill on your cardiac medications before your next appointment, please call your pharmacy.

## 2017-01-29 NOTE — Assessment & Plan Note (Signed)
Vitals:   01/29/17 0931  BP: (!) 144/50  Pulse: 70  Temp: 97.6 F (36.4 C)   Recently tapered off clonidine patch, on metoprolol 12.5mg  daily and amlodipine 10mg  daily. Doing ok with this. Continue current regimen. In the future may increase metoprolol as her HR is still not at goal for her CAD. - has appt to f/up with Dr. Daryll Drown later this month.

## 2017-01-31 DIAGNOSIS — E039 Hypothyroidism, unspecified: Secondary | ICD-10-CM | POA: Diagnosis not present

## 2017-01-31 DIAGNOSIS — E43 Unspecified severe protein-calorie malnutrition: Secondary | ICD-10-CM | POA: Diagnosis not present

## 2017-01-31 DIAGNOSIS — I130 Hypertensive heart and chronic kidney disease with heart failure and stage 1 through stage 4 chronic kidney disease, or unspecified chronic kidney disease: Secondary | ICD-10-CM | POA: Diagnosis not present

## 2017-01-31 DIAGNOSIS — R634 Abnormal weight loss: Secondary | ICD-10-CM | POA: Diagnosis not present

## 2017-01-31 DIAGNOSIS — E871 Hypo-osmolality and hyponatremia: Secondary | ICD-10-CM | POA: Diagnosis not present

## 2017-01-31 DIAGNOSIS — Z681 Body mass index (BMI) 19 or less, adult: Secondary | ICD-10-CM | POA: Diagnosis not present

## 2017-02-03 DIAGNOSIS — E871 Hypo-osmolality and hyponatremia: Secondary | ICD-10-CM | POA: Diagnosis not present

## 2017-02-03 DIAGNOSIS — E43 Unspecified severe protein-calorie malnutrition: Secondary | ICD-10-CM | POA: Diagnosis not present

## 2017-02-03 DIAGNOSIS — E039 Hypothyroidism, unspecified: Secondary | ICD-10-CM | POA: Diagnosis not present

## 2017-02-03 DIAGNOSIS — I130 Hypertensive heart and chronic kidney disease with heart failure and stage 1 through stage 4 chronic kidney disease, or unspecified chronic kidney disease: Secondary | ICD-10-CM | POA: Diagnosis not present

## 2017-02-03 DIAGNOSIS — Z681 Body mass index (BMI) 19 or less, adult: Secondary | ICD-10-CM | POA: Diagnosis not present

## 2017-02-03 DIAGNOSIS — R634 Abnormal weight loss: Secondary | ICD-10-CM | POA: Diagnosis not present

## 2017-02-04 ENCOUNTER — Telehealth: Payer: Self-pay | Admitting: Internal Medicine

## 2017-02-04 NOTE — Telephone Encounter (Signed)
APT. REMINDER CALL, LMTCB °

## 2017-02-05 ENCOUNTER — Ambulatory Visit: Payer: Self-pay

## 2017-02-05 ENCOUNTER — Other Ambulatory Visit: Payer: Self-pay | Admitting: Internal Medicine

## 2017-02-12 ENCOUNTER — Encounter: Payer: Self-pay | Admitting: Internal Medicine

## 2017-02-12 ENCOUNTER — Ambulatory Visit (INDEPENDENT_AMBULATORY_CARE_PROVIDER_SITE_OTHER): Payer: Medicare Other | Admitting: Internal Medicine

## 2017-02-12 VITALS — BP 139/60 | HR 88 | Temp 98.0°F | Ht 64.0 in | Wt 113.9 lb

## 2017-02-12 DIAGNOSIS — E039 Hypothyroidism, unspecified: Secondary | ICD-10-CM

## 2017-02-12 DIAGNOSIS — F333 Major depressive disorder, recurrent, severe with psychotic symptoms: Secondary | ICD-10-CM

## 2017-02-12 DIAGNOSIS — K625 Hemorrhage of anus and rectum: Secondary | ICD-10-CM | POA: Diagnosis not present

## 2017-02-12 DIAGNOSIS — Z7984 Long term (current) use of oral hypoglycemic drugs: Secondary | ICD-10-CM | POA: Diagnosis not present

## 2017-02-12 DIAGNOSIS — L299 Pruritus, unspecified: Secondary | ICD-10-CM | POA: Diagnosis not present

## 2017-02-12 DIAGNOSIS — E119 Type 2 diabetes mellitus without complications: Secondary | ICD-10-CM | POA: Diagnosis not present

## 2017-02-12 DIAGNOSIS — Z79899 Other long term (current) drug therapy: Secondary | ICD-10-CM | POA: Diagnosis not present

## 2017-02-12 DIAGNOSIS — K644 Residual hemorrhoidal skin tags: Secondary | ICD-10-CM | POA: Diagnosis not present

## 2017-02-12 DIAGNOSIS — R2241 Localized swelling, mass and lump, right lower limb: Secondary | ICD-10-CM

## 2017-02-12 DIAGNOSIS — H35033 Hypertensive retinopathy, bilateral: Secondary | ICD-10-CM

## 2017-02-12 DIAGNOSIS — I10 Essential (primary) hypertension: Secondary | ICD-10-CM

## 2017-02-12 LAB — GLUCOSE, CAPILLARY: Glucose-Capillary: 89 mg/dL (ref 65–99)

## 2017-02-12 NOTE — Patient Instructions (Addendum)
Christine [name] - -   Please continue taking your medications as prescribed.   For the bleeding - Please continue to monitor for any further bleeding.  You have a small hemorrhoid noted on exam and this is the likely cause of the bleeding given your recent constipation.  For this, you can try preparation H to see if it helps.  Also, make sure that you are not straining to go to the bathroom.  I will check your blood counts today to make sure you are not becoming anemic.    For the rash - I would recommend placing eucerin or vaseline on the site at nighttime.  For the itching, you can try benadryl cream in the morning.  It appears to be a small patch of eczema.  Please let me know if it does not improve.    I have placed a consult for a new eye doctor.    For the dentist - You will need to call your insurance provider for options of who takes your insurance.    Please come back to see me in 2 months, sooner if you need to.    Hemorrhoids Hemorrhoids are swollen veins in and around the rectum or anus. Hemorrhoids can cause pain, itching, or bleeding. Most of the time, they do not cause serious problems. They usually get better with diet changes, lifestyle changes, and other home treatments. Follow these instructions at home: Eating and drinking   Eat foods that have fiber, such as whole grains, beans, nuts, fruits, and vegetables. Ask your doctor about taking products that have added fiber (fibersupplements).  Drink enough fluid to keep your pee (urine) clear or pale yellow. For Pain and Swelling   Take a warm-water bath (sitz bath) for 20 minutes to ease pain. Do this 3-4 times a day.  If directed, put ice on the painful area. It may be helpful to use ice between your warm baths.  Put ice in a plastic bag.  Place a towel between your skin and the bag.  Leave the ice on for 20 minutes, 2-3 times a day. General instructions   Take over-the-counter and prescription medicines only as told  by your doctor.  Medicated creams and medicines that are inserted into the anus (suppositories) may be used or applied as told.  Exercise often.  Go to the bathroom when you have the urge to poop (to have a bowel movement). Do not wait.  Avoid pushing too hard (straining) when you poop.  Keep the butt area dry and clean. Use wet toilet paper or moist paper towels.  Do not sit on the toilet for a long time. Contact a doctor if:  You have any of these:  Pain and swelling that do not get better with treatment or medicine.  Bleeding that will not stop.  Trouble pooping or you cannot poop.  Pain or swelling outside the area of the hemorrhoids. This information is not intended to replace advice given to you by your health care provider. Make sure you discuss any questions you have with your health care provider. Document Released: 08/13/2008 Document Revised: 04/11/2016 Document Reviewed: 07/19/2015 Elsevier Interactive Patient Education  2017 [employer].

## 2017-02-12 NOTE — Progress Notes (Signed)
Subjective:    [name], female    DOB: 01/24/1934, 83 y.o.   MRN: [id number]  HPI  Christine [name] had a prolonged illness earlier this year.  She was admitted to the hospital on 3 occasions and had a series of symptoms and signs.  Most recently she was seen in the clinic by Dr. Genene Churn and her BP medications were continued.  She is now on imipramine, zyprexa and remeron and much improved.  She is seeing Dr. Ronnald Ramp in [place].  She has been restarted on her glimepiride.    Today, Christine [name] reports that she is doing well.  She came on her own and is very much back to her normal self.  She reports not remembering some of what happened while she was in the hospital.  She is still following with Ger[facility] and has an appointment tomorrow per her.   Christine [name] reports a asymmetric area on her knee.  On the medial side she noticed a "growth of bone" that she has just begun noticing about the last 2 weeks. Non painful, does not inhibit her gait.  She is not sure if it was there before.  She mainly noticed because it was asymmetric.   She further reports an itchy patch on her left flank.  She has no other areas of itching, no pain, rash or burning at the site.  She denies any recent changes to her clothes or detergents.  It is only in one spot and has not happened before.  At the site, she has some skin changes due to scratching, but no rash.   Finally, she reports an episode of rectal bleeding 2 nights ago.  She has been having a very small amount of blood on the tissue paper for a few weeks occasionally, but 2 nights ago when she wiped it was "a lot of blood."  She cannot further quantify the amount.  There was no blood mixed with the stool, no purple discoloration.  She did not have this happen in the last 2 days.  She reports recent constipation and straining.  She has started taking more fiber.  She was more dizzy this morning and did not take her BP medications.    She brought in her BS log  which we reviewed.  Her last A1C was 7.5 at last check in February, which I think is a good range for her.  Her blood sugars range from 70s - 190s.   Review of Systems  Constitutional: Negative for activity change and fatigue.  Cardiovascular: Negative for chest pain.  Gastrointestinal: Positive for anal bleeding and constipation. Negative for abdominal pain, diarrhea and vomiting.  Musculoskeletal: Negative for arthralgias and gait problem.  Skin: Positive for color change and rash.  Neurological: Positive for dizziness. Negative for syncope and light-headedness.       Objective:   Physical Exam  Constitutional: She is oriented to person, place, and time. She appears well-developed and well-nourished.  HENT:  Head: Normocephalic and atraumatic.  Eyes: Conjunctivae are normal. No scleral icterus.  Cardiovascular: Normal rate and regular rhythm.   Murmur (systolic, best heard at RUSB, 2/6) heard. Pulmonary/Chest: Effort normal and breath sounds normal.  Genitourinary: Rectal exam shows external hemorrhoid. Rectal exam shows no fissure, no mass, no tenderness, anal tone normal and guaiac negative stool.  Musculoskeletal: She exhibits no edema.  She has a hardened linear projection on the medial right knee.  Does not seem to be associated with joint  space.  No TTP over the area, no skin changes or rash.  She has no joint line tenderness.  The area is asymmetric compared to the left knee.    Neurological: She is alert and oriented to person, place, and time.  Skin:  She has a roughly circular area of skin discoloration and dryness on the left flank right at the pant line.  Excoriations noted.  No rash.   Psychiatric: She has a normal mood and affect. Her behavior is normal.    CBC today    Assessment & Plan:  RTC in 2 months.   Itchy skin - Conservative therapy with eucerin/vaseline and benadryl cream to start.  She will let me know if not improving.   Knee asymmetry - I am not  sure what to make of this hardened area.  I offered an xray, but she is not interested.  She would rather conservatively monitor and let me now if it grows or becomes painful.

## 2017-02-12 NOTE — Assessment & Plan Note (Addendum)
She noted this today with increased blood on the tissue during wiping, but this resolved on it's own.  Also with constipation and straining recently.  I think this is likely hemorrhoids.  We did a rectal exam and she had no stool in the vault, no blood.  She has a small external hemorrhoid noted.  She also had good rectal tone.    Plan Monitor, appears resolved.  I advised her that if it worsens or persists to come back to clinic.  Advised she could try preparation H.  Check CBC, she had no conjunctival pallor

## 2017-02-12 NOTE — Assessment & Plan Note (Signed)
A1C 7.5.  She brought in her BS log and revealed BS ranging from the 60s to the 190s depending on time of day.  She is on glimepiride and does not report any further hypoglycemic events.   Plan Continue glimepiride.

## 2017-02-12 NOTE — Assessment & Plan Note (Signed)
TSH at last check was within goal, continue synthroid.

## 2017-02-12 NOTE — Assessment & Plan Note (Signed)
Her MS is completely back to normal.  She is doing well per her, completing her ADLs and daily tasks on her own.  She is following with psychiatry and doing well.   Plan Continue imipramine Continue zyprexa Continue Remeron

## 2017-02-12 NOTE — Assessment & Plan Note (Signed)
BP 139/60.  She has had intermittent issues with dizziness and falling in the past, so we are targeting a slightly higher BP today.  She further has CAD and is on a beta blocker for this.  Pulse is probably high given her comorbidity, however, with her history of falling and confusion, would err on the side of caution.  Plan Continue amlodipine and metoprolol

## 2017-02-13 DIAGNOSIS — F332 Major depressive disorder, recurrent severe without psychotic features: Secondary | ICD-10-CM | POA: Diagnosis not present

## 2017-02-13 LAB — CBC
HEMOGLOBIN: 10.9 g/dL — AB (ref 11.1–15.9)
Hematocrit: 33.3 % — ABNORMAL LOW (ref 34.0–46.6)
MCH: 29.6 pg (ref 26.6–33.0)
MCHC: 32.7 g/dL (ref 31.5–35.7)
MCV: 91 fL (ref 79–97)
PLATELETS: 362 10*3/uL (ref 150–379)
RBC: 3.68 x10E6/uL — AB (ref 3.77–5.28)
RDW: 14.5 % (ref 12.3–15.4)
WBC: 6.9 10*3/uL (ref 3.4–10.8)

## 2017-02-17 ENCOUNTER — Other Ambulatory Visit: Payer: Self-pay | Admitting: Internal Medicine

## 2017-02-17 DIAGNOSIS — F333 Major depressive disorder, recurrent, severe with psychotic symptoms: Secondary | ICD-10-CM

## 2017-02-28 ENCOUNTER — Encounter: Payer: Self-pay | Admitting: Internal Medicine

## 2017-03-06 ENCOUNTER — Telehealth: Payer: Self-pay | Admitting: Physician Assistant

## 2017-03-06 DIAGNOSIS — I10 Essential (primary) hypertension: Secondary | ICD-10-CM

## 2017-03-06 MED ORDER — VALSARTAN 80 MG PO TABS: 80.0000 mg | ORAL_TABLET | Freq: Every day | ORAL | 3 refills | Status: DC

## 2017-03-06 NOTE — Telephone Encounter (Signed)
Start Valsartan 80 mg QD. BMET in 2 weeks.  Richardson Dopp, PA-C    03/06/2017 3:37 PM

## 2017-03-06 NOTE — Telephone Encounter (Signed)
Left message to call back  

## 2017-03-06 NOTE — Telephone Encounter (Signed)
New message     Pt c/o BP issue: STAT if pt c/o blurred vision, one-sided weakness or slurred speech  1. What are your last 5 BP readings?  15th 168/73 HR 75@ 8:17am, 166/91 @ 9:09pm; 16th 157/74 HR 62@8 :13 am--no evening reading, 17th 171/78 HR 64@ 9:28 am---no evening reading, 18th 165/77 HR 65@ 7:59am, 157/75 HR 69@ 9:37 pm; 19th 171/84 HR 63 @ 7:42am 2. Are you having any other symptoms (ex. Dizziness, headache, blurred vision, passed out)?  no 3. What is your BP issue?  Calling to give bp readings.  Please call and let pt know what she need to do

## 2017-03-06 NOTE — Telephone Encounter (Signed)
Spoke with pt and advised her of recommendations per Richardson Dopp, PA-C.  Pt verbalized understanding and was in agreement with this plan.  Pt prefers to come in on a Monday for repeat labs as that's the best day for transportation.  Pt agreeable to come 03/24/17 for labs.

## 2017-03-06 NOTE — Telephone Encounter (Signed)
Spoke with pt and went over BP's listed below.  Pt denies any dizziness.  States she does have occasional HA.  Advised pt that I would send this information to Richardson Dopp, PA-C for review and advisement.

## 2017-03-19 ENCOUNTER — Ambulatory Visit (INDEPENDENT_AMBULATORY_CARE_PROVIDER_SITE_OTHER): Payer: Medicare Other | Admitting: Internal Medicine

## 2017-03-19 DIAGNOSIS — J029 Acute pharyngitis, unspecified: Secondary | ICD-10-CM

## 2017-03-19 DIAGNOSIS — J3489 Other specified disorders of nose and nasal sinuses: Secondary | ICD-10-CM

## 2017-03-19 DIAGNOSIS — J019 Acute sinusitis, unspecified: Secondary | ICD-10-CM

## 2017-03-19 DIAGNOSIS — J329 Chronic sinusitis, unspecified: Secondary | ICD-10-CM | POA: Insufficient documentation

## 2017-03-19 NOTE — Patient Instructions (Signed)
It was a pleasure seeing you today. Thank you for choosing [facility] for your healthcare needs.   Please continue to drink plenty of fluids.  You may use throat lozenges for throat pain. Continue to use Claritin and nose spray.  If your symptoms do not improve or worsen in 1 week call or return to clinic.

## 2017-03-19 NOTE — Progress Notes (Addendum)
   CC: Throat pain HPI: [name] is a 83 y.o. female with a h/o of hypertension, depression, type 2 diabetes and hypothyroidism who presents with a 3 day history of throat pain, rhinorrhea and sneezing.    Review of Systems: She denies chest pain or shortness of breath. She denies nausea, vomiting or abdominal pain.  Physical Exam: Vitals:   03/19/17 1454  BP: (!) 132/59  Pulse: 73  Temp: 98.2 F (36.8 C)  TempSrc: Oral  SpO2: 100%  Weight: 124 lb (56.2 kg)  Height: 5\' 4"  (1.626 m)   General appearance: alert and cooperative Head: Normocephalic, without obvious abnormality, atraumatic Nose: mild swelling, Minimal swelling and erythema of the middle turbinate Throat: No tonsillar exudates appreciated Neck: no adenopathy and supple, symmetrical, trachea midline Lungs: clear to auscultation bilaterally Heart: regular rate and rhythm, S1, S2 normal, no murmur, click, rub or gallop Abdomen: soft, non-tender; bowel sounds normal; no masses,  no organomegaly  Assessment & Plan:  See encounters tab for problem based medical decision making. Patient discussed with Dr. Beryle Beams  Signed: Ophelia Shoulder, MD 03/19/2017, 3:11 PM  Pager: [phone]

## 2017-03-19 NOTE — Assessment & Plan Note (Signed)
Patient presents with a 72 hour history of throat pain, rhinorrhea and sneezing. She denies facial pressure or headache. She denies cough or fever. She denies nausea, vomiting or abdominal pain. Physical examination reveals minimally erythematous and swollen middle turbinate. No cervical lymphadenopathy appreciated. No tonsillar exudate visible on exam. I think the most likely etiology of the patient's presentation is an acute viral sinusitis. I do not suspect bacterial etiology. I do not suspect strep pharyngitis. I discussed with the patient that she should continue supportive measures at home including taking plenty of fluids and throat lozenges for symptomatic relief. I also discussed with the patient that if her symptoms worsen in the next week to call the clinic and we will consider second evaluation versus antimicrobial therapy. -- Supportive measures -- Drink plenty of fluids -- If symptoms worsen or do not improve in 7 days call the clinic

## 2017-03-20 NOTE — Progress Notes (Signed)
Medicine attending: Medical history, presenting problems, physical findings, and medications, reviewed with resident physician Dr Ophelia Shoulder on the day of the patient visit and I concur with his evaluation and management plan.

## 2017-03-24 ENCOUNTER — Other Ambulatory Visit (INDEPENDENT_AMBULATORY_CARE_PROVIDER_SITE_OTHER): Payer: Medicare Other

## 2017-03-24 DIAGNOSIS — I10 Essential (primary) hypertension: Secondary | ICD-10-CM

## 2017-03-25 ENCOUNTER — Telehealth: Payer: Self-pay | Admitting: *Deleted

## 2017-03-25 LAB — BASIC METABOLIC PANEL
BUN/Creatinine Ratio: 20 (ref 12–28)
BUN: 24 mg/dL (ref 8–27)
CO2: 21 mmol/L (ref 18–29)
CREATININE: 1.19 mg/dL — AB (ref 0.57–1.00)
Calcium: 9.1 mg/dL (ref 8.7–10.3)
Chloride: 102 mmol/L (ref 96–106)
GFR calc Af Amer: 49 mL/min/{1.73_m2} — ABNORMAL LOW (ref >59)
GFR calc non Af Amer: 43 mL/min/{1.73_m2} — ABNORMAL LOW (ref >59)
GLUCOSE: 68 mg/dL (ref 65–99)
Potassium: 5 mmol/L (ref 3.5–5.2)
Sodium: 141 mmol/L (ref 134–144)

## 2017-03-25 NOTE — Telephone Encounter (Signed)
-----   Message from Liliane Shi, [place] sent at 03/25/2017  3:19 PM EDT ----- Please call the patient Kidney function is stable. Continue with current treatment plan. Richardson Dopp, PA-C   03/25/2017 3:18 PM

## 2017-03-25 NOTE — Telephone Encounter (Signed)
Lmtcb to go over lab results 

## 2017-03-31 ENCOUNTER — Other Ambulatory Visit: Payer: Self-pay | Admitting: Internal Medicine

## 2017-03-31 MED ORDER — ONETOUCH ULTRASOFT LANCETS MISC: 12 refills | Status: DC

## 2017-03-31 MED ORDER — AMLODIPINE BESYLATE 10 MG PO TABS: 10.0000 mg | ORAL_TABLET | Freq: Every day | ORAL | 1 refills | Status: DC

## 2017-03-31 NOTE — Telephone Encounter (Signed)
Will need to discuss at doctors appointment as I have never filled this for her before.  If it is urgent, she can be moved up to this week or next.    Thanks!

## 2017-03-31 NOTE — Telephone Encounter (Signed)
Patient informed & will call for appt this week. Is having a lot of pain & would like to be seen this week. [place] desk notified.

## 2017-03-31 NOTE — Telephone Encounter (Signed)
Patient also requesting refill on Norco 5/325 & phenergan 25 mg 1 tab BID. It was given by her rheumatologist (has retired) back in [place] 2017. Takes phenergan to prevent nausea from [place]. PCP appt 5/30. pls. Advise.

## 2017-04-01 ENCOUNTER — Telehealth: Payer: Self-pay | Admitting: *Deleted

## 2017-04-01 NOTE — Telephone Encounter (Signed)
-----   Message from Liliane Shi, [place] sent at 03/25/2017  3:19 PM EDT ----- Please call the patient Kidney function is stable. Continue with current treatment plan. Richardson Dopp, PA-C   03/25/2017 3:18 PM

## 2017-04-01 NOTE — Telephone Encounter (Signed)
Tried to reach pt x 2 to go over lab results. No answer today.

## 2017-04-02 ENCOUNTER — Encounter: Payer: Self-pay | Admitting: Internal Medicine

## 2017-04-02 ENCOUNTER — Ambulatory Visit (INDEPENDENT_AMBULATORY_CARE_PROVIDER_SITE_OTHER): Payer: Medicare Other | Admitting: Internal Medicine

## 2017-04-02 VITALS — BP 144/62 | HR 66 | Temp 97.6°F | Ht 64.0 in | Wt 125.7 lb

## 2017-04-02 DIAGNOSIS — I10 Essential (primary) hypertension: Secondary | ICD-10-CM | POA: Diagnosis not present

## 2017-04-02 DIAGNOSIS — E119 Type 2 diabetes mellitus without complications: Secondary | ICD-10-CM

## 2017-04-02 DIAGNOSIS — K625 Hemorrhage of anus and rectum: Secondary | ICD-10-CM

## 2017-04-02 DIAGNOSIS — G5601 Carpal tunnel syndrome, right upper limb: Secondary | ICD-10-CM | POA: Diagnosis not present

## 2017-04-02 DIAGNOSIS — M47816 Spondylosis without myelopathy or radiculopathy, lumbar region: Secondary | ICD-10-CM

## 2017-04-02 DIAGNOSIS — G5603 Carpal tunnel syndrome, bilateral upper limbs: Secondary | ICD-10-CM

## 2017-04-02 LAB — GLUCOSE, CAPILLARY: GLUCOSE-CAPILLARY: 83 mg/dL (ref 65–99)

## 2017-04-02 LAB — POCT GLYCOSYLATED HEMOGLOBIN (HGB A1C): Hemoglobin A1C: 6.4

## 2017-04-02 MED ORDER — HYDROCODONE-ACETAMINOPHEN 5-325 MG PO TABS: 1.0000 | ORAL_TABLET | Freq: Four times a day (QID) | ORAL | 0 refills | Status: DC | PRN

## 2017-04-02 MED ORDER — PROMETHAZINE HCL 12.5 MG PO TABS: 12.5000 mg | ORAL_TABLET | Freq: Four times a day (QID) | ORAL | 0 refills | Status: DC | PRN

## 2017-04-02 NOTE — Assessment & Plan Note (Signed)
She reports getting injections in her right hand for arthritis, but I think it is more likely for her CTS.  She is due to see her orthopedist for this issue (she has had injections in that office in the past).  In the interim, she is requesting a refill on her Norco.  She was prescribed this in October of 2017 in her rheumatology office.  She only received 60 pills at that time and this has lasted her for 7 months.  She reports that she only takes it when her pain recurs and that the injection keeps her pain away for up to 6 months.   I reviewed the [place] narcotic database and her reporting was accurate.     Plan Refill Norco 5/325 #60 for 1 time fill.  She is due to see orthopedics in a few weeks Refill phenergan 12.5mg  PRN for nausea with the medication.

## 2017-04-02 NOTE — Telephone Encounter (Signed)
-----   Message from Liliane Shi, [place] sent at 03/25/2017  3:19 PM EDT ----- Please call the patient Kidney function is stable. Continue with current treatment plan. Richardson Dopp, PA-C   03/25/2017 3:18 PM

## 2017-04-02 NOTE — Progress Notes (Signed)
Subjective:    [name], female    DOB: 01/24/1934, 83 y.o.   MRN: [id number]  CC: acute complaint of back pain  HPI  Christine [name] is an 83yo woman who presents with acute complaint of back pain.  She had previously been treated for PMR, however, she was sent to Rheumatology a few years ago and they diagnosed her instead with lumbar spondylosis and she received an injection in the spine and improved.  Today she reports that her pain is mainly in her right hand.  She has a history of carpal tunnel syndrome and has been getting injections in the palm for many years.  She has recently been getting these from Dr. Charlestine Night, but he is retiring.  She has a new appointment with another rheumatologist but has not seen him/her yet.  She does have an appointment with her orthopedic surgeon to continue the injections which she gets about every 6 months.  She understands that they are for arthritis.  I called and confirmed that she has gotten an Rx for hydrocodone-acetaminophen in October 2017 for 60 pills.  She has not had further refills.  She does not have somnolence or constipation with them and feels that they relieve her pain.  She does not need to take them after she has an injection.  She is requesting a new refill for norco and phenergan as this medication gives her nausea.  She tries to take 1/2 - 1 tablet no more than twice a day.    Christine [name] blood pressure was elevated today to 172/48 when first evaluated  And improved to 144/62.  She has had issues with orthostasis and due to age and recent illness, has had a higher cut off for her BP.    At last visit, she had noted self-limited rectal bleeding that seemed to be related to hemorrhoids given she also had constipation.  Today she reports that she continues to have some bleeding on the toilet paper when she wipes if she has to go multiple times a day.  The stool is brown.  She does not have dizziness or lightheadedness.  I reviewed her  CBCs and up until January of this year, she was in the normal range and now ranging about 10-11.  She had a colonoscopy in 2009 which showed no polyps and recommended 10 year follow up.  She sees  GI.  She also had an acute illness in January of this year which led to her developing malnutrition and being placed on many new medications.    She was recently seen in the [facility] on 5/2 for sinus issues, today she reports that her symptoms are improved.    Review of Systems  Constitutional: Negative for fatigue and unexpected weight change.  Eyes: Negative for photophobia and visual disturbance.  Respiratory: Negative for cough and shortness of breath.   Cardiovascular: Negative for chest pain.  Gastrointestinal: Positive for anal bleeding. Negative for abdominal pain, blood in stool, constipation, diarrhea and rectal pain.  Musculoskeletal: Negative for arthralgias.  Neurological: Negative for dizziness, syncope, weakness, light-headedness and numbness.  Psychiatric/Behavioral: Negative for confusion and dysphoric mood.       Objective:   Physical Exam  Constitutional: She is oriented to person, place, and time.  Thin, elderly woman in NAD  HENT:  Head: Normocephalic and atraumatic.  Eyes: Conjunctivae are normal. No scleral icterus.  No conjunctival pallor  Cardiovascular: Normal rate and regular rhythm.   Murmur heard. Pulmonary/Chest:  Effort normal and breath sounds normal. No respiratory distress. She has no wheezes.  Abdominal: Bowel sounds are normal. There is no tenderness.  Musculoskeletal: She exhibits deformity (She has a hardened area on the medial inferior aspect of right knee).  Neurological: She is alert and oriented to person, place, and time.  Psychiatric: She has a normal mood and affect. Her behavior is normal.    No labs today.       Assessment & Plan:  RTC in 2 months, sooner if needed.   Knee deformity - noted on last exam and again today.  Consider knee  xray at next visit.

## 2017-04-02 NOTE — Assessment & Plan Note (Signed)
She continues to have occasional rectal bleeding.  Symptoms continue to be consistent with hemorrhoid which was seen on exam at last visit.  She is due to see her GI doctor (she sees Therapist, music).  She had a colonoscopy in 2009 and was recommended to have one in 10 years (2019).  Given age, I am not sure they would go forward with this exam.  She has had a lower Hgb over the last 5 months.  The initial lowering also coincides with an acute illness and new medications, however.    Plan I advised her to follow up with her GI doctor as soon as possible.  Monitor for worsening bleeding.

## 2017-04-02 NOTE — Telephone Encounter (Signed)
DPR ok to [facility]. Lmom lab work ok, kidney function stable, continue on current Tx plan. Any questions call back [phone].

## 2017-04-02 NOTE — Assessment & Plan Note (Signed)
BP today was initially high at 172/48.  She notes that since her pain started it has been higher at home as well.  On recheck, BP was 144/62.  She is taking metoprolol, valsartan and amlodipine without issue.  She has a history of orthostasis and worsened pain today, so opted to keep her on current regimen.   Plan Continue amlodipine, metoprolol, valsartan

## 2017-04-02 NOTE — Patient Instructions (Addendum)
Christine [name] - -   For your pain, please fill the hydrocodone-acetaminophen and use sparingly.  I agree with seeing your orthopedist for further injections since they seem to work for you.   For your continued hemorrhoidal bleeding, please see your gastroenterologist and discuss with them.  You would be due for a repeat colonoscopy next year (2019).  Please make an appointment with them in the next few months.    We will find out about your eye doctor and assist in making an appointment to see them to check your eyes for glaucoma and diabetic retinopathy.   Thank you for coming in today.   Please come back to see me in about 2 months, sooner if needed.    Acetaminophen; Hydrocodone tablets or capsules What is this medicine? ACETAMINOPHEN; HYDROCODONE (a set a MEE noe fen; hye droe KOE done) is a pain reliever. It is used to treat moderate to severe pain. This medicine may be used for other purposes; ask your health care provider or pharmacist if you have questions. COMMON BRAND NAME(S): Anexsia, Bancap HC, Ceta-Plus, Co-Gesic, Comfortpak, Dolagesic, [employer], DuoCet, Hydrocet, Hydrogesic, [place], Lorcet HD, Lorcet Plus, Lortab, Margesic H, Maxidone, [place], Polygesic, [place], [place], Cabin crew, Vicodin, Vicodin ES, Vicodin HP, Charlane Ferretti What should I tell my health care provider before I take this medicine? They need to know if you have any of these conditions: -brain tumor -Crohn's disease, inflammatory bowel disease, or ulcerative colitis -drug abuse or addiction -head injury -heart or circulation problems -if you often drink alcohol -kidney disease or problems going to the bathroom -liver disease -lung disease, asthma, or breathing problems -an unusual or allergic reaction to acetaminophen, hydrocodone, other opioid analgesics, other medicines, foods, dyes, or preservatives -pregnant or trying to get pregnant -breast-feeding How should I use this medicine? Take this  medicine by mouth with a glass of water. Follow the directions on the prescription label. You can take it with or without food. If it upsets your stomach, take it with food. Do not take your medicine more often than directed. A special MedGuide will be given to you by the pharmacist with each prescription and refill. Be sure to read this information carefully each time. Talk to your pediatrician regarding the use of this medicine in children. Special care may be needed. Overdosage: If you think you have taken too much of this medicine contact a poison control center or emergency room at once. NOTE: This medicine is only for you. Do not share this medicine with others. What if I miss a dose? If you miss a dose, take it as soon as you can. If it is almost time for your next dose, take only that dose. Do not take double or extra doses. What may interact with this medicine? This medicine may interact with the following medications: -alcohol -antiviral medicines for HIV or AIDS -atropine -antihistamines for allergy, cough and cold -certain antibiotics like erythromycin, clarithromycin -certain medicines for anxiety or sleep -certain medicines for bladder problems like oxybutynin, tolterodine -certain medicines for depression like amitriptyline, fluoxetine, sertraline -certain medicines for fungal infections like ketoconazole and itraconazole -certain medicines for Parkinson's disease like benztropine, trihexyphenidyl -certain medicines for seizures like carbamazepine, phenobarbital, phenytoin, primidone -certain medicines for stomach problems like dicyclomine, hyoscyamine -certain medicines for travel sickness like scopolamine -general anesthetics like halothane, isoflurane, methoxyflurane, propofol -ipratropium -local anesthetics like lidocaine, pramoxine, tetracaine -MAOIs like Carbex, Eldepryl, Marplan, Nardil, and Parnate -medicines that relax muscles for surgery -other medicines with  acetaminophen -  other narcotic medicines for pain or cough -phenothiazines like chlorpromazine, mesoridazine, prochlorperazine, thioridazine -rifampin This list may not describe all possible interactions. Give your health care provider a list of all the medicines, herbs, non-prescription drugs, or dietary supplements you use. Also tell them if you smoke, drink alcohol, or use illegal drugs. Some items may interact with your medicine. What should I watch for while using this medicine? Tell your doctor or health care professional if your pain does not go away, if it gets worse, or if you have new or a different type of pain. You may develop tolerance to the medicine. Tolerance means that you will need a higher dose of the medicine for pain relief. Tolerance is normal and is expected if you take the medicine for a long time. Do not suddenly stop taking your medicine because you may develop a severe reaction. Your body becomes used to the medicine. This does NOT mean you are addicted. Addiction is a behavior related to getting and using a drug for a non-medical reason. If you have pain, you have a medical reason to take pain medicine. Your doctor will tell you how much medicine to take. If your doctor wants you to stop the medicine, the dose will be slowly lowered over time to avoid any side effects. There are different types of narcotic medicines (opiates). If you take more than one type at the same time or if you are taking another medicine that also causes drowsiness, you may have more side effects. Give your health care provider a list of all medicines you use. Your doctor will tell you how much medicine to take. Do not take more medicine than directed. Call emergency for help if you have problems breathing or unusual sleepiness. Do not take other medicines that contain acetaminophen with this medicine. Always read labels carefully. If you have questions, ask your doctor or pharmacist. If you take too much  acetaminophen get medical help right away. Too much acetaminophen can be very dangerous and cause liver damage. Even if you do not have symptoms, it is important to get help right away. You may get drowsy or dizzy. Do not drive, use machinery, or do anything that needs mental alertness until you know how this medicine affects you. Do not stand or sit up quickly, especially if you are an older patient. This reduces the risk of dizzy or fainting spells. Alcohol may interfere with the effect of this medicine. Avoid alcoholic drinks. The medicine will cause constipation. Try to have a bowel movement at least every 2 to 3 days. If you do not have a bowel movement for 3 days, call your doctor or health care professional. Your mouth may get dry. Chewing sugarless gum or sucking hard candy, and drinking plenty of water may help. Contact your doctor if the problem does not go away or is severe. What side effects may I notice from receiving this medicine? Side effects that you should report to your doctor or health care professional as soon as possible: -allergic reactions like skin rash, itching or hives, swelling of the face, lips, or tongue -breathing problems -confusion -redness, blistering, peeling or loosening of the skin, including inside the mouth -signs and symptoms of low blood pressure like dizziness; feeling faint or lightheaded, falls; unusually weak or tired -trouble passing urine or change in the amount of urine -yellowing of the eyes or skin Side effects that usually do not require medical attention (report to your doctor or health care professional if  they continue or are bothersome): -constipation -dry mouth -nausea, vomiting -tiredness This list may not describe all possible side effects. Call your doctor for medical advice about side effects. You may report side effects to FDA at 1-800-FDA-1088. Where should I keep my medicine? Keep out of the reach of children. This medicine can be  abused. Keep your medicine in a safe place to protect it from theft. Do not share this medicine with anyone. Selling or giving away this medicine is dangerous and against the law. This medicine may cause accidental overdose and death if it taken by other adults, children, or pets. Mix any unused medicine with a substance like cat litter or coffee grounds. Then throw the medicine away in a sealed container like a sealed bag or a coffee can with a lid. Do not use the medicine after the expiration date. Store at room temperature between 15 and 30 degrees C (59 and 86 degrees F). NOTE: This sheet is a summary. It may not cover all possible information. If you have questions about this medicine, talk to your doctor, pharmacist, or health care provider.  2018 Elsevier/Gold Standard (2015-07-28 10:02:16)

## 2017-04-11 DIAGNOSIS — F332 Major depressive disorder, recurrent severe without psychotic features: Secondary | ICD-10-CM | POA: Diagnosis not present

## 2017-04-15 ENCOUNTER — Other Ambulatory Visit: Payer: Self-pay | Admitting: Internal Medicine

## 2017-04-16 ENCOUNTER — Ambulatory Visit: Payer: Self-pay | Admitting: Internal Medicine

## 2017-04-17 ENCOUNTER — Other Ambulatory Visit: Payer: Self-pay | Admitting: Internal Medicine

## 2017-04-17 DIAGNOSIS — G5601 Carpal tunnel syndrome, right upper limb: Secondary | ICD-10-CM | POA: Diagnosis not present

## 2017-04-17 DIAGNOSIS — G5603 Carpal tunnel syndrome, bilateral upper limbs: Secondary | ICD-10-CM

## 2017-05-01 ENCOUNTER — Ambulatory Visit: Payer: Self-pay | Admitting: Cardiovascular Disease

## 2017-05-04 NOTE — Progress Notes (Signed)
Cardiology Office Note   Date:  05/05/2017   ID:  [name], DOB 01/24/1934, MRN [id number]  PCP:  [name], MD  Cardiologist:   Skeet Latch, MD   Chief Complaint  Patient presents with  . Follow-up    pt states feeling dizzy every once and a while       History of Present Illness: Christine [name] is a 83 y.o. female with CAD, carotid stenosis, hypertension, hyperlipidemia, hypothyroidism, diabetes who presents for follow up.  Christine [name] was previously a patient of Dr. Aundra Dubin.  She initially established care 08/2011 when she presented with chest pain. She had a left heart catheterization that revealed 40-50% left main stenosis and a 75% diagonal lesion that were medically managed.  She had a repeat cath 09/2015 that was unchanged.  CPX 10/2015 revealed mild obstructive pulmonary disease and severe chronotropic incompetence.  Metoprolol was decreased.  There was also concern for possible exercise-induced pulmonary hypertension or functional MR.  Christine [name] had a 48 hour Holter 03/2016 that showed occasional PACs and PVCs.  In 2017 she had orthostatic hypotension.  She was started on compression stockings and pyridostigmine. She did not tolerate this due to dizziness.  She last saw Richardson Dopp 01/2017 and was doing well.  BP was elevated to 144/50 but her regimen was not altered due to her history of orthostasis.  Christine [name] last saw Dr. Daryll [name] 03/2017 and her BP was elevated.  This was thought to be due to pain.  She reports that she's been feeling well. She has occasional episodes of chest pain that lasts for a few minutes. She describes it as a dull aching sensation that typically occurs when she is sitting. There is no associated shortness of breath, nausea, or diaphoresis. It does not happen with exertion. However, she does not get much formal exercise. She does perform all her ADLs and has no symptoms. She denies lower extremity edema, orthopnea, or PND. Christine [name] endorses  dizziness that occurs while sitting or laying. She denies orthostasis.   Past Medical History:  Diagnosis Date  . Anemia   . Anginal pain ([place])   . Arthritis    "back, arms, hips; hands" (11/30/2015)  . Benign hypertensive heart and kidney disease with diastolic CHF, NYHA class II and CKD stage III ([place])   . Blood transfusion 1972   "after daughter born, attempted to give me blood; couldn't give it cause my blood was cold" (09/23/2013)  . CAD (coronary artery disease)    a. LHC 08/23/11: dLM 40-50%, oRI 40%, oCFX 40%, oD1 70% (small and not amenable to PCI).  LM lesion did not appear to be flow limiting.  Medical rx was recommended;  b. Echo 08/23/11: mild LVH, EF 60-65%, grade 1 diast dysfxn, mild BAE, PASP 24;  c. 02/2012  Cath: LM 50d, LAD 50p, D1 70ost, RI 70p, RCA ok->Med Rx;  d. 01/2013 Cardiolite: EF 88, no ischemia/infarct.  . Carotid stenosis    a. dopplers 10/12:  0-39% bilat ICA;  b. 10/2012 U/S: 0-39% bilat, f/u 1 yr (10/2013).  . Chronic bronchitis   . Depression   . Diverticulosis of colon (without mention of hemorrhage)   . GERD (gastroesophageal reflux disease)   . Heart murmur, systolic    2-D echo in December 2011 showed a normal EF with grade 1 diastolic dysfunction, trivial pulmonary regurgitation and mildly elevated PA pressure at 37 mmHg probably secondary to her COPD.dynamic obstruction-mid cavity obliteration;  b.  02/2012 Echo: EF 60-65%, mild LVH, PASP 68mmHg.  Marland Kitchen History of stomach ulcers 1970's  . Hyperlipidemia   . Hypertension   . Hypothyroidism   . Migraines    a. Next atypical symptoms in the past. Patient was started on Neurontin for possible neuropathic origin of her pain.  . Polymyalgia rheumatica ([place])   . Shortness of breath on exertion    "just related to angina >1 yr ago" (09/23/2013)  . Sinus arrhythmia   . Type II diabetes mellitus ([place])    a. On oral hypoglycemic agents.    Past Surgical History:  Procedure Laterality Date  . CARDIAC  CATHETERIZATION N/A 09/26/2015   Procedure: Right/Left Heart Cath and Coronary Angiography;  Surgeon: Larey Dresser, MD;  Location: [facility];  Service: Cardiovascular;  Laterality: N/A;  . CATARACT EXTRACTION W/ INTRAOCULAR LENS  IMPLANT, BILATERAL Bilateral 1990's  . LEFT HEART CATHETERIZATION WITH CORONARY ANGIOGRAM N/A 03/16/2012   Procedure: LEFT HEART CATHETERIZATION WITH CORONARY ANGIOGRAM;  Surgeon: Hillary Bow, MD;  Location: [facility] CATH LAB;  Service: Cardiovascular;  Laterality: N/A;  . VAGINAL HYSTERECTOMY  1976     Current Outpatient Prescriptions  Medication Sig Dispense Refill  . acetaminophen (TYLENOL) 500 MG tablet Take 1,000 mg by mouth every 6 (six) hours as needed for headache. For pain    . ADVAIR DISKUS 250-50 MCG/DOSE AEPB inhale 1 dose by mouth every 12 hours 60 each 11  . amLODipine (NORVASC) 10 MG tablet Take 1 tablet (10 mg total) by mouth daily. 90 tablet 1  . Ascorbic Acid (VITAMIN C PO) Take 1 tablet by mouth daily.     Marland Kitchen atorvastatin (LIPITOR) 40 MG tablet Take 0.5 tablets (20 mg total) by mouth every evening. 90 tablet 1  . benzocaine-menthol (SORE THROAT LOZENGES) 6-10 MG lozenge Take 1 lozenge by mouth as needed for sore throat. 100 tablet 0  . clopidogrel (PLAVIX) 75 MG tablet take 1 tablet by mouth every evening 30 tablet 5  . esomeprazole (NEXIUM) 40 MG capsule take 1 capsule by mouth once daily BEFORE BREAKFAST 30 capsule 1  . feeding supplement, GLUCERNA SHAKE, (GLUCERNA SHAKE) LIQD Take 237 mLs by mouth 3 (three) times daily between meals. 237 mL 11  . glimepiride (AMARYL) 1 MG tablet Take 1 mg by mouth daily with breakfast.    . glucose blood test strip Use as instructed 100 each 12  . HYDROcodone-acetaminophen (NORCO/VICODIN) 5-325 MG tablet Take 1 tablet by mouth every 6 (six) hours as needed for moderate pain. 60 tablet 0  . hydroxypropyl methylcellulose (ISOPTO TEARS) 2.5 % ophthalmic solution Place 1 drop into both eyes at bedtime.     Marland Kitchen  imipramine (TOFRANIL) 50 MG tablet take 1 tablet by mouth twice a day 180 tablet 3  . Lancets (ONETOUCH ULTRASOFT) lancets Use to check blood sugar two times a day Dx code E11.9 100 each 12  . levothyroxine (SYNTHROID, LEVOTHROID) 25 MCG tablet take 1 tablet by mouth once daily 90 tablet 1  . metoprolol succinate (TOPROL-XL) 25 MG 24 hr tablet Take 0.5 tablets (12.5 mg total) by mouth daily. 90 tablet 3  . mirtazapine (REMERON) 7.5 MG tablet Take 1 tablet (7.5 mg total) by mouth at bedtime. 30 tablet 2  . Multiple Vitamin (MULTIVITAMIN WITH MINERALS) TABS tablet Take 1 tablet by mouth daily. 30 tablet 2  . NITROSTAT 0.4 MG SL tablet place 1 tablet under the tongue if needed every 5 minutes for chest pain for 3 doses  IF NO RELIEF AFTER 3RD DOSE CALL PRESCRIBER OR 911. 25 tablet 3  . polycarbophil (FIBERCON) 625 MG tablet Take 1 tablet (625 mg total) by mouth daily. 30 tablet 2  . polyethylene glycol (MIRALAX / GLYCOLAX) packet Take 17 g by mouth daily as needed. 14 each 3  . promethazine (PHENERGAN) 12.5 MG tablet take 1 tablet by mouth every 6 hours if needed for nausea and vomiting 30 tablet 6  . senna (SENOKOT) 8.6 MG TABS tablet Take 1 tablet (8.6 mg total) by mouth at bedtime as needed for mild constipation. 120 each 1  . valsartan (DIOVAN) 80 MG tablet Take 1 tablet (80 mg total) by mouth daily. 90 tablet 3   No current facility-administered medications for this visit.     Allergies:   Aspirin; Atarax [hydroxyzine]; Nsaids; Codeine; Haldol [haloperidol]; Other; and Penicillins    Social History:  The patient  reports that she has never smoked. She has never used smokeless tobacco. She reports that she does not drink alcohol or use drugs.   Family History:  The patient's family history includes COPD in her father; Colon cancer in her maternal grandmother; Heart attack in her maternal grandmother; Other in her mother; Pulmonary Hypertension in her daughter.    ROS:  Please see the history  of present illness.   Otherwise, review of systems are positive for none.   All other systems are reviewed and negative.    PHYSICAL EXAM: VS:  BP (!) 136/52   Pulse 77   Ht 5\' 4"  (1.626 m)   Wt 58.9 kg (129 lb 12.8 oz)   LMP 02/11/1975   SpO2 (!) 89%   BMI 22.28 kg/m  , BMI Body mass index is 22.28 kg/m. GENERAL:  Well appearing.  No acute distress. HEENT:  Pupils equal round and reactive, fundi not visualized, oral mucosa unremarkable NECK:  No jugular venous distention, waveform within normal limits, carotid upstroke brisk and symmetric, no bruits, no thyromegaly LYMPHATICS:  No cervical adenopathy LUNGS:  Clear to auscultation bilaterally.   No crackles, wheezes, or rhonchi. HEART:  RRR.  PMI not displaced or sustained,S1 and S2 within normal limits, no S3, no S4, no clicks, no rubs, III/VI systolic ejection murmur at the left upper sternal border. ABD:  Flat, positive bowel sounds normal in frequency in pitch, no bruits, no rebound, no guarding, no midline pulsatile mass, no hepatomegaly, no splenomegaly EXT:  2 plus pulses throughout, no edema, no cyanosis no clubbing SKIN:  No rashes no nodules NEURO:  Cranial nerves II through XII grossly intact, motor grossly intact throughout PSYCH:  Cognitively intact, oriented to person place and time    EKG:  EKG is not ordered today.  48 hour Holter 04/12/16: Occasional PACs and PVCs, no worrisome arrhythmias.  CPX 10/24/15: Conclusion: Exercise testing with gas exchange demonstrates a mild to moderate functional impairment when compared to matched sedentary norms. Pre-exercise spirometry suggests mild obstruction. There appears to be a significant circulatory limitation with severe chronotropic incompetence, a low OUES and a markedly elevated VeVCO22 slope. The VE/VCO2 slope is suggestive of high filling pressures but recent RHC was normal. The possibility of exercise-induced PAH or functional MR is raised. Myocardial ischemia  cannot be excluded. If symptoms do not improve with a formal exercise training program consider exercise RHC.   RHC/LHC 09/26/15: 1. Low filling pressures, no evidence for CHF as cause of her dyspnea.  2. Stable coronary disease.  The 40-50% left main stenosis and the 70-80% D1  stenosis do not appear to have progressed compared to 2013 angiography.   3. I cannot explain her exertional symptoms on the basis of this procedure.  Would consider CPX.   Echo 09/19/15:  Study Conclusions  - Left ventricle: The cavity size was normal. Wall thickness was   increased in a pattern of moderate LVH. Systolic function was   normal. The estimated ejection fraction was in the range of 60%   to 65%. Wall motion was normal; there were no regional wall   motion abnormalities. Left ventricular diastolic function   parameters were normal. - Atrial septum: No defect or patent foramen ovale was identified.   Recent Labs: 12/25/2016: ALT 25; Magnesium 1.8; TSH 1.582 02/12/2017: Hemoglobin 10.9; Platelets 362 03/24/2017: BUN 24; Creatinine, Ser 1.19; Potassium 5.0; Sodium 141    Lipid Panel    Component Value Date/Time   CHOL 136 05/08/2016 0957   TRIG 207 (H) 05/08/2016 0957   HDL 40 05/08/2016 0957   CHOLHDL 3.4 05/08/2016 0957   CHOLHDL 4 08/05/2014 0933   VLDL 33.8 08/05/2014 0933   LDLCALC 55 05/08/2016 0957      Wt Readings from Last 3 Encounters:  05/05/17 58.9 kg (129 lb 12.8 oz)  04/02/17 57 kg (125 lb 11.2 oz)  03/19/17 56.2 kg (124 lb)      ASSESSMENT AND PLAN:  # Medically managed obstructive CAD:  Christine [name] has known left main and diagonal lesions that are medically managed. She has occasional mild episodes of chest pain that are not exertional. It is unclear that these are anginal. Continue amlodipine, atorvastatin, clopidogrel, and metoprolol.  # Hypertension: Blood pressure slightly above goal. However she has a history of orthostasis. Therefore we'll not make any changes.  Continue amlodipine, metoprolol, and valsartan.  # Hyperlipidemia: She will return for fasting lipids and a CMP. Continue atorvastatin.  # Systolic murmur: Christine [name] reports that she has had a murmur for many years. A systolic murmur was noted on Dr. Claris Gladden exam and her last echo in 2016 did not reveal any valvular heart disease. No further assessment indicated at this time.   Current medicines are reviewed at length with the patient today.  The patient does not have concerns regarding medicines.  The following changes have been made:  no change  Labs/ tests ordered today include:   Orders Placed This Encounter  Procedures  . Lipid panel  . Comprehensive metabolic panel    Disposition:   FU with Nassir Neidert C. Oval Linsey, MD, [facility] in 6 months.    This note was written with the assistance of speech recognition software.  Please excuse any transcriptional errors.  Signed, Kynzleigh Bandel C. Oval Linsey, MD, [facility]  05/05/2017 9:40 AM    [place]

## 2017-05-05 ENCOUNTER — Encounter: Payer: Self-pay | Admitting: Cardiovascular Disease

## 2017-05-05 ENCOUNTER — Ambulatory Visit (INDEPENDENT_AMBULATORY_CARE_PROVIDER_SITE_OTHER): Payer: Medicare Other | Admitting: Cardiovascular Disease

## 2017-05-05 VITALS — BP 136/52 | HR 77 | Ht 64.0 in | Wt 129.8 lb

## 2017-05-05 DIAGNOSIS — E7800 Pure hypercholesterolemia, unspecified: Secondary | ICD-10-CM | POA: Diagnosis not present

## 2017-05-05 DIAGNOSIS — Z79899 Other long term (current) drug therapy: Secondary | ICD-10-CM | POA: Diagnosis not present

## 2017-05-05 DIAGNOSIS — I209 Angina pectoris, unspecified: Secondary | ICD-10-CM

## 2017-05-05 DIAGNOSIS — I208 Other forms of angina pectoris: Secondary | ICD-10-CM

## 2017-05-05 DIAGNOSIS — I10 Essential (primary) hypertension: Secondary | ICD-10-CM | POA: Diagnosis not present

## 2017-05-05 DIAGNOSIS — I25119 Atherosclerotic heart disease of native coronary artery with unspecified angina pectoris: Secondary | ICD-10-CM | POA: Diagnosis not present

## 2017-05-05 NOTE — Patient Instructions (Signed)
Medication Instructions:  Your physician recommends that you continue on your current medications as directed. Please refer to the Current Medication list given to you today.  Labwork: FASTING LP/CMET SOON   Testing/Procedures: NONE  Follow-Up: Your physician wants you to follow-up in: 6 MONTH OV  You will receive a reminder letter in the mail two months in advance. If you don't receive a letter, please call our office to schedule the follow-up appointment.  If you need a refill on your cardiac medications before your next appointment, please call your pharmacy.  

## 2017-05-19 ENCOUNTER — Other Ambulatory Visit: Payer: Self-pay | Admitting: Internal Medicine

## 2017-05-19 DIAGNOSIS — J302 Other seasonal allergic rhinitis: Secondary | ICD-10-CM

## 2017-05-19 NOTE — Telephone Encounter (Signed)
NEEDS REFILL FOR NOSE SPRAY, [facility] [address]

## 2017-05-28 ENCOUNTER — Ambulatory Visit (INDEPENDENT_AMBULATORY_CARE_PROVIDER_SITE_OTHER): Payer: Medicare Other | Admitting: Internal Medicine

## 2017-05-28 ENCOUNTER — Encounter: Payer: Self-pay | Admitting: Internal Medicine

## 2017-05-28 VITALS — BP 138/50 | HR 66 | Temp 98.0°F | Ht 64.0 in | Wt 130.2 lb

## 2017-05-28 DIAGNOSIS — M8580 Other specified disorders of bone density and structure, unspecified site: Secondary | ICD-10-CM | POA: Diagnosis not present

## 2017-05-28 DIAGNOSIS — R5383 Other fatigue: Secondary | ICD-10-CM

## 2017-05-28 DIAGNOSIS — E43 Unspecified severe protein-calorie malnutrition: Secondary | ICD-10-CM

## 2017-05-28 DIAGNOSIS — E559 Vitamin D deficiency, unspecified: Secondary | ICD-10-CM | POA: Diagnosis not present

## 2017-05-28 DIAGNOSIS — M8588 Other specified disorders of bone density and structure, other site: Secondary | ICD-10-CM | POA: Diagnosis not present

## 2017-05-28 DIAGNOSIS — Z6822 Body mass index (BMI) 22.0-22.9, adult: Secondary | ICD-10-CM | POA: Diagnosis not present

## 2017-05-28 DIAGNOSIS — G5600 Carpal tunnel syndrome, unspecified upper limb: Secondary | ICD-10-CM | POA: Diagnosis not present

## 2017-05-28 DIAGNOSIS — G5603 Carpal tunnel syndrome, bilateral upper limbs: Secondary | ICD-10-CM

## 2017-05-28 DIAGNOSIS — E039 Hypothyroidism, unspecified: Secondary | ICD-10-CM

## 2017-05-28 DIAGNOSIS — Z79899 Other long term (current) drug therapy: Secondary | ICD-10-CM

## 2017-05-28 DIAGNOSIS — H35033 Hypertensive retinopathy, bilateral: Secondary | ICD-10-CM | POA: Diagnosis not present

## 2017-05-28 DIAGNOSIS — K625 Hemorrhage of anus and rectum: Secondary | ICD-10-CM | POA: Diagnosis not present

## 2017-05-28 DIAGNOSIS — J309 Allergic rhinitis, unspecified: Secondary | ICD-10-CM | POA: Diagnosis not present

## 2017-05-28 DIAGNOSIS — J302 Other seasonal allergic rhinitis: Secondary | ICD-10-CM

## 2017-05-28 DIAGNOSIS — I10 Essential (primary) hypertension: Secondary | ICD-10-CM | POA: Diagnosis not present

## 2017-05-28 DIAGNOSIS — R630 Anorexia: Secondary | ICD-10-CM

## 2017-05-28 LAB — GLUCOSE, CAPILLARY: GLUCOSE-CAPILLARY: 148 mg/dL — AB (ref 65–99)

## 2017-05-28 MED ORDER — FLUTICASONE PROPIONATE 50 MCG/ACT NA SUSP: 1.0000 | Freq: Every day | NASAL | 2 refills | Status: DC

## 2017-05-28 NOTE — Patient Instructions (Signed)
Christine [name] - -  Thank you for coming in today!  For your headaches, please try restarting your flonase and see if this helps.  You can also take over the counter tylenol if you would like to.   For your knee pain, please keep track if it is getting worse.  You can take your hydrocodone, or over the counter tylenol to try for the pain.   For your fatigue - we will check labwork today and I will call you early next week with the results.    Thank you!  Please come back to see me in 2-3 months, sooner if you need to.

## 2017-05-28 NOTE — Assessment & Plan Note (Signed)
At last check was < 30 but not in the range of needing treatment.   Given fatigue, will recheck today.

## 2017-05-28 NOTE — Assessment & Plan Note (Signed)
Improved, resolved 

## 2017-05-28 NOTE — Assessment & Plan Note (Signed)
Recheck TSH today given new fatigue.  We discussed how she is taking her medication and she takes it with glimepiride and another medication 30 minutes before eating.  Feasibly, if she is also taking her PPI or plavix, there could be interactions which would cause the synthroid to have decreased absorption.   Plan Check TSH.

## 2017-05-28 NOTE — Assessment & Plan Note (Signed)
Seems to have resolved, but she is now also having fatigue issues as noted in HPI.   Plan Check CBC today Encouraged patient to follow up with GI on 7/26.

## 2017-05-28 NOTE — Assessment & Plan Note (Signed)
Unclear cause.  Could be many chronic issues or a new acute issue.  She has h/o rectal bleeding now resolved.  She has hypothyroidism. She has mild vitamin D deficiency.   Will start with work up with BMET for renal function, CBC for anemia, Vitamin D to see if this has further decreased and TSH to see if her synthroid dose is appropriate.  I informed family that I would be out of town until Monday and would let them know any results at that time, unless they are grossly abnormal.

## 2017-05-28 NOTE — Progress Notes (Signed)
Subjective:    [name], female    DOB: 01/24/1934, 83 y.o.   MRN: [id number]  CC: Feeling more tired  HPI  [name] is an 83yo woman with PMH of HTN, hypertensive retinopathy, MDD, HLD, Hypothyroidism, CAD, GERD who presents for follow up.   [name] reports that for the last 2-3 days she has been feeling more fatigued.  She notes that she is more tired.  She has associated frontal headache which has been going on for about 1 month.  She notes dry cough which comes and goes and blowing her nose more often.  She has not passed out or fallen.  She was able to walk with her daughter about 1 quarter of a mile on Sunday, so this is relatively recent.  She denies any recent bleeding (had rectal bleeding a couple of months ago, for which she is seeing GI on 7/23).  She has been out of her flonase which may explain the headaches and runny nose.  She notes the headaches are mild, come and go, stay mainly under her forehead and sometimes radiate to the back of her head.  She has not tried anything for them.  She is on imipramine for chronic migraine therapy.  She denies chest pain, fever, chills, recent illness, dizziness, lightheadedness, fainting, diarrhea, constipation, abdominal pain, leg swelling, weight loss.    She notes that for her hand arthritis/CTS she recently had injectable therapy with her orthopedic doctor with good results.  She is not needing to take the hydrocodone regularly.   She notes that the rectal bleeding has stopped, see above for more details.    She has a history of retinopathy and has an ophthalmology appointment on 7/16.    Reviewed her blood sugar log, highs in the 180s but mostly in the 90s - 120s.     Review of Systems  Constitutional: Positive for fatigue. Negative for activity change, fever and unexpected weight change.  HENT: Positive for congestion and rhinorrhea. Negative for facial swelling, sinus pain, sinus pressure and sore throat.   Eyes:  Negative for visual disturbance.  Respiratory: Positive for cough (dry, occasional) and shortness of breath (paroxysms, usually when resting.  Not with activity). Negative for choking.   Cardiovascular: Negative for chest pain and leg swelling.  Gastrointestinal: Negative for abdominal pain, blood in stool, diarrhea and nausea.  Musculoskeletal: Positive for arthralgias (knee pain, not severe). Negative for gait problem.  Neurological: Positive for numbness (right hand, after recent injection) and headaches. Negative for dizziness, syncope, weakness and light-headedness.  Psychiatric/Behavioral: Negative for decreased concentration and dysphoric mood.       Objective:   Physical Exam  Constitutional: She is oriented to person, place, and time. She appears well-developed and well-nourished. No distress.  Thin elderly woman, but has gained weight since last being seen.   HENT:  Head: Normocephalic and atraumatic.  Mouth/Throat: Oropharynx is clear and moist. No oropharyngeal exudate.  Eyes: Conjunctivae are normal. No scleral icterus.  No conjunctival pallor  Neck: Neck supple. No thyromegaly present.  Cardiovascular: Normal rate, regular rhythm and normal heart sounds.   No murmur heard. Pulmonary/Chest: Effort normal and breath sounds normal. No respiratory distress. She has no wheezes. She exhibits no tenderness.  Abdominal: Soft. Bowel sounds are normal.  Musculoskeletal: She exhibits no edema or tenderness.  Lymphadenopathy:    She has no cervical adenopathy.  Neurological: She is alert and oriented to person, place, and time.  Psychiatric: She  has a normal mood and affect. Her behavior is normal.    TSH, BMET, CBC, vitamin D today.      Assessment & Plan:  RTC in 3 months.

## 2017-05-28 NOTE — Assessment & Plan Note (Signed)
BP is 138/50 today.  She is above goal, but has history of orthostasis and dizziness.  She will continue on her current therapy.   Plan Continue valsartan, metoprolol, amlodipine

## 2017-05-28 NOTE — Assessment & Plan Note (Signed)
Improved with injections by orthopedics.  She is still having some hand numbness on the right, but reports she is due to see the surgeon again to discuss.

## 2017-05-28 NOTE — Assessment & Plan Note (Signed)
Much improved, patient is eating regularly and has regained a lot of weight lost during acute illness in January/February.

## 2017-05-28 NOTE — Assessment & Plan Note (Signed)
She reports not being aware of this diagnosis.  She is only taking a multivitamin, but reports Ca being in there.  She had a vitamin D checked in February which was mildly low.    Plan Discuss repeat DEXA at next visit Check Vitamin D.

## 2017-05-28 NOTE — Assessment & Plan Note (Signed)
She has been out of flonase.  Has a mild headache and runny nose issue today.  Will do a trial of restarting flonase to see if this helps.  If not improving, I advised her to come back to the clinic for further evaluation.  She is also taking loratadine which I encouraged her to continue.   Plan Restart flonase Continue loratadine.

## 2017-05-28 NOTE — Assessment & Plan Note (Signed)
She reports that she is due to see her eye doctor this month and will send results to our office.  She has had no acute change in vision or blurry vision.

## 2017-05-29 LAB — CBC WITH DIFFERENTIAL/PLATELET
BASOS ABS: 0 10*3/uL (ref 0.0–0.2)
Basos: 1 %
EOS (ABSOLUTE): 0.2 10*3/uL (ref 0.0–0.4)
Eos: 4 %
HEMATOCRIT: 37 % (ref 34.0–46.6)
Hemoglobin: 11.7 g/dL (ref 11.1–15.9)
Immature Grans (Abs): 0 10*3/uL (ref 0.0–0.1)
Immature Granulocytes: 0 %
LYMPHS ABS: 2 10*3/uL (ref 0.7–3.1)
Lymphs: 35 %
MCH: 26.8 pg (ref 26.6–33.0)
MCHC: 31.6 g/dL (ref 31.5–35.7)
MCV: 85 fL (ref 79–97)
MONOS ABS: 0.5 10*3/uL (ref 0.1–0.9)
Monocytes: 9 %
Neutrophils Absolute: 2.8 10*3/uL (ref 1.4–7.0)
Neutrophils: 51 %
Platelets: 330 10*3/uL (ref 150–379)
RBC: 4.36 x10E6/uL (ref 3.77–5.28)
RDW: 15.5 % — ABNORMAL HIGH (ref 12.3–15.4)
WBC: 5.6 10*3/uL (ref 3.4–10.8)

## 2017-05-29 LAB — BMP8+ANION GAP
ANION GAP: 17 mmol/L (ref 10.0–18.0)
BUN/Creatinine Ratio: 22 (ref 12–28)
BUN: 22 mg/dL (ref 8–27)
CALCIUM: 9.6 mg/dL (ref 8.7–10.3)
CO2: 22 mmol/L (ref 20–29)
CREATININE: 0.98 mg/dL (ref 0.57–1.00)
Chloride: 103 mmol/L (ref 96–106)
GFR calc Af Amer: 63 mL/min/{1.73_m2} (ref >59)
GFR, EST NON AFRICAN AMERICAN: 54 mL/min/{1.73_m2} — AB (ref >59)
Glucose: 71 mg/dL (ref 65–99)
Potassium: 4.8 mmol/L (ref 3.5–5.2)
SODIUM: 142 mmol/L (ref 134–144)

## 2017-05-29 LAB — TSH: TSH: 4.72 u[IU]/mL — AB (ref 0.450–4.500)

## 2017-05-29 LAB — VITAMIN D 25 HYDROXY (VIT D DEFICIENCY, FRACTURES): VIT D 25 HYDROXY: 32.7 ng/mL (ref 30.0–100.0)

## 2017-06-02 ENCOUNTER — Ambulatory Visit: Payer: Self-pay | Admitting: Gastroenterology

## 2017-06-02 DIAGNOSIS — E119 Type 2 diabetes mellitus without complications: Secondary | ICD-10-CM | POA: Diagnosis not present

## 2017-06-02 LAB — HM DIABETES EYE EXAM

## 2017-06-03 ENCOUNTER — Encounter: Payer: Self-pay | Admitting: *Deleted

## 2017-06-09 ENCOUNTER — Ambulatory Visit (INDEPENDENT_AMBULATORY_CARE_PROVIDER_SITE_OTHER): Payer: Medicare Other | Admitting: Gastroenterology

## 2017-06-09 ENCOUNTER — Encounter: Payer: Self-pay | Admitting: Gastroenterology

## 2017-06-09 VITALS — BP 128/60 | HR 72 | Ht 64.0 in | Wt 131.2 lb

## 2017-06-09 DIAGNOSIS — Z1211 Encounter for screening for malignant neoplasm of colon: Secondary | ICD-10-CM | POA: Diagnosis not present

## 2017-06-09 DIAGNOSIS — K219 Gastro-esophageal reflux disease without esophagitis: Secondary | ICD-10-CM

## 2017-06-09 DIAGNOSIS — I25119 Atherosclerotic heart disease of native coronary artery with unspecified angina pectoris: Secondary | ICD-10-CM

## 2017-06-09 MED ORDER — ESOMEPRAZOLE MAGNESIUM 20 MG PO CPDR: 20.0000 mg | DELAYED_RELEASE_CAPSULE | Freq: Every day | ORAL | 3 refills | Status: DC

## 2017-06-09 NOTE — Patient Instructions (Signed)
If you are age 81 or older, your body mass index should be between 23-30. Your Body mass index is 22.53 kg/m. If this is out of the aforementioned range listed, please consider follow up with your Primary Care Provider.  If you are age 42 or younger, your body mass index should be between 19-25. Your Body mass index is 22.53 kg/m. If this is out of the aformentioned range listed, please consider follow up with your Primary Care Provider.   We have sent the following medications to your pharmacy for you to pick up at your convenience:  Nexium 20mg . Take daily to every other day  Thank you.

## 2017-06-09 NOTE — Progress Notes (Signed)
HPI :  83 y/o female with a history of CAD on plavix, stage III CKD, PMR, GERD, last seen in our clinic on 03/31/2014 by Dr. Deatra Ina, here to establish care for GERD. She takes nexium 40mg  for GERD. She has been taking it roughly every other day or every 2-3 days. This appears to work well for her. It works for her GERD. She reports she came off it before and did okay for a while but then symptoms recurred. She has had cough associated with poorly controlled symptoms. She denies any dysphagia. She has never been on a 20mg  dose. No prior EGD. No FH of esophageal cancer. She report she has been on Zantac and other H2 blockers in the past for her reflux which she didn't tolerate well at all.   She denies any aspirin use. She denies any NSAIDs.  Colonoscopy 09/28/2008 - diverticulosis,  Colonoscopy 08/26/2000 - redundant colon, otherwise normal  Past Medical History:  Diagnosis Date  . Anemia   . Anginal pain ([place])   . Arthritis    "back, arms, hips; hands" (11/30/2015)  . Benign hypertensive heart and kidney disease with diastolic CHF, NYHA class II and CKD stage III ([place])   . Blood transfusion 1972   "after daughter born, attempted to give me blood; couldn't give it cause my blood was cold" (09/23/2013)  . CAD (coronary artery disease)    a. LHC 08/23/11: dLM 40-50%, oRI 40%, oCFX 40%, oD1 70% (small and not amenable to PCI).  LM lesion did not appear to be flow limiting.  Medical rx was recommended;  b. Echo 08/23/11: mild LVH, EF 60-65%, grade 1 diast dysfxn, mild BAE, PASP 24;  c. 02/2012  Cath: LM 50d, LAD 50p, D1 70ost, RI 70p, RCA ok->Med Rx;  d. 01/2013 Cardiolite: EF 88, no ischemia/infarct.  . Carotid stenosis    a. dopplers 10/12:  0-39% bilat ICA;  b. 10/2012 U/S: 0-39% bilat, f/u 1 yr (10/2013).  . Chronic bronchitis   . Depression   . Diverticulosis of colon (without mention of hemorrhage)   . GERD (gastroesophageal reflux disease)   . Heart murmur, systolic    2-D echo in December  2011 showed a normal EF with grade 1 diastolic dysfunction, trivial pulmonary regurgitation and mildly elevated PA pressure at 37 mmHg probably secondary to her COPD.dynamic obstruction-mid cavity obliteration;  b. 02/2012 Echo: EF 60-65%, mild LVH, PASP 39mmHg.  Marland Kitchen History of stomach ulcers 1970's  . Hyperlipidemia   . Hypertension   . Hypothyroidism   . Migraines    a. Next atypical symptoms in the past. Patient was started on Neurontin for possible neuropathic origin of her pain.  . Polymyalgia rheumatica ([place])   . Shortness of breath on exertion    "just related to angina >1 yr ago" (09/23/2013)  . Sinus arrhythmia   . Type II diabetes mellitus ([place])    a. On oral hypoglycemic agents.     Past Surgical History:  Procedure Laterality Date  . CARDIAC CATHETERIZATION N/A 09/26/2015   Procedure: Right/Left Heart Cath and Coronary Angiography;  Surgeon: Larey Dresser, MD;  Location: [place] [facility];  Service: Cardiovascular;  Laterality: N/A;  . CATARACT EXTRACTION W/ INTRAOCULAR LENS  IMPLANT, BILATERAL Bilateral 1990's  . LEFT HEART CATHETERIZATION WITH CORONARY ANGIOGRAM N/A 03/16/2012   Procedure: LEFT HEART CATHETERIZATION WITH CORONARY ANGIOGRAM;  Surgeon: Hillary Bow, MD;  Location: [facility];  Service: Cardiovascular;  Laterality: N/A;  . VAGINAL HYSTERECTOMY  1976   Family History  Problem Relation Age of Onset  . Colon cancer Maternal Grandmother   . Heart attack Maternal Grandmother   . COPD Father   . Other Mother        died of unknown causes in her 46's. Pt raised by grandmother.  . Pulmonary Hypertension Daughter    Social History  Substance Use Topics  . Smoking status: Never Smoker  . Smokeless tobacco: Never Used  . Alcohol use No     Comment: 11/29/2016 "Last drink 1967"   Current Outpatient Prescriptions  Medication Sig Dispense Refill  . acetaminophen (TYLENOL) 500 MG tablet Take 1,000 mg by mouth every 6 (six) hours as needed for headache. For  pain    . ADVAIR DISKUS 250-50 MCG/DOSE AEPB inhale 1 dose by mouth every 12 hours 60 each 11  . amLODipine (NORVASC) 10 MG tablet Take 1 tablet (10 mg total) by mouth daily. 90 tablet 1  . Ascorbic Acid (VITAMIN C PO) Take 1 tablet by mouth daily.     Marland Kitchen atorvastatin (LIPITOR) 40 MG tablet Take 0.5 tablets (20 mg total) by mouth every evening. 90 tablet 1  . benzocaine-menthol (SORE THROAT LOZENGES) 6-10 MG lozenge Take 1 lozenge by mouth as needed for sore throat. 100 tablet 0  . clopidogrel (PLAVIX) 75 MG tablet take 1 tablet by mouth every evening 30 tablet 5  . esomeprazole (NEXIUM) 40 MG capsule take 1 capsule by mouth once daily BEFORE BREAKFAST 30 capsule 1  . feeding supplement, GLUCERNA SHAKE, (GLUCERNA SHAKE) LIQD Take 237 mLs by mouth 3 (three) times daily between meals. 237 mL 11  . fluticasone (FLONASE) 50 MCG/ACT nasal spray Place 1 spray into both nostrils daily. 16 g 2  . glimepiride (AMARYL) 1 MG tablet Take 1 mg by mouth daily with breakfast.    . glucose blood test strip Use as instructed 100 each 12  . HYDROcodone-acetaminophen (NORCO/VICODIN) 5-325 MG tablet Take 1 tablet by mouth every 6 (six) hours as needed for moderate pain. 60 tablet 0  . hydroxypropyl methylcellulose (ISOPTO TEARS) 2.5 % ophthalmic solution Place 1 drop into both eyes at bedtime.     Marland Kitchen imipramine (TOFRANIL) 50 MG tablet take 1 tablet by mouth twice a day 180 tablet 3  . Lancets (ONETOUCH ULTRASOFT) lancets Use to check blood sugar two times a day Dx code E11.9 100 each 12  . levothyroxine (SYNTHROID, LEVOTHROID) 25 MCG tablet take 1 tablet by mouth once daily 90 tablet 1  . metoprolol succinate (TOPROL-XL) 25 MG 24 hr tablet Take 0.5 tablets (12.5 mg total) by mouth daily. 90 tablet 3  . mirtazapine (REMERON) 7.5 MG tablet Take 1 tablet (7.5 mg total) by mouth at bedtime. 30 tablet 2  . Multiple Vitamin (MULTIVITAMIN WITH MINERALS) TABS tablet Take 1 tablet by mouth daily. 30 tablet 2  . NITROSTAT 0.4  MG SL tablet place 1 tablet under the tongue if needed every 5 minutes for chest pain for 3 doses IF NO RELIEF AFTER 3RD DOSE CALL PRESCRIBER OR 911. 25 tablet 3  . polycarbophil (FIBERCON) 625 MG tablet Take 1 tablet (625 mg total) by mouth daily. 30 tablet 2  . polyethylene glycol (MIRALAX / GLYCOLAX) packet Take 17 g by mouth daily as needed. 14 each 3  . promethazine (PHENERGAN) 12.5 MG tablet take 1 tablet by mouth every 6 hours if needed for nausea and vomiting 30 tablet 6  . senna (SENOKOT) 8.6 MG TABS tablet Take 1  tablet (8.6 mg total) by mouth at bedtime as needed for mild constipation. 120 each 1  . valsartan (DIOVAN) 80 MG tablet Take 1 tablet (80 mg total) by mouth daily. 90 tablet 3  . esomeprazole (NEXIUM) 20 MG capsule Take 1 capsule (20 mg total) by mouth daily. Take 20mg  everyday to every other day as directed. 90 capsule 3   No current facility-administered medications for this visit.    Allergies  Allergen Reactions  . Aspirin Swelling and Other (See Comments)    Angioedema  . Atarax [Hydroxyzine] Other (See Comments)    psychosis  . Nsaids Other (See Comments)    "interacts with heart medications"  . Codeine Nausea And Vomiting and Other (See Comments)    "makes me deathly sick" - can take with nausea medicine   . Haldol [Haloperidol] Other (See Comments)    EPS - rigidity and dystonic reaction, very sensitive  . Other Other (See Comments)    MSG  . Penicillins Diarrhea    "real bad" Has patient had a PCN reaction causing immediate rash, facial/tongue/throat swelling, SOB or lightheadedness with hypotension: Yes Has patient had a PCN reaction causing severe rash involving mucus membranes or skin necrosis: No Has patient had a PCN reaction that required hospitalization No Has patient had a PCN reaction occurring within the last 10 years: Yes If all of the above answers are "NO", then may proceed with Cephalosporin use.      Review of Systems: All systems  reviewed and negative except where noted in HPI.   Lab Results  Component Value Date   WBC 5.6 05/28/2017   HGB 11.7 05/28/2017   HCT 37.0 05/28/2017   MCV 85 05/28/2017   PLT 330 05/28/2017    Lab Results  Component Value Date   CREATININE 0.98 05/28/2017   BUN 22 05/28/2017   NA 142 05/28/2017   K 4.8 05/28/2017   CL 103 05/28/2017   CO2 22 05/28/2017    Lab Results  Component Value Date   ALT 25 12/25/2016   AST 23 12/25/2016   GGT 33 09/17/2016   ALKPHOS 89 12/25/2016   BILITOT 1.1 12/25/2016     Physical Exam: BP 128/60   Pulse 72   Ht 5\' 4"  (1.626 m)   Wt 131 lb 4 oz (59.5 kg)   LMP 02/11/1975   BMI 22.53 kg/m  Constitutional: Pleasant,well-developed, female in no acute distress. HEENT: Normocephalic and atraumatic. Conjunctivae are normal. No scleral icterus. Neck supple.  Cardiovascular: Normal rate, regular rhythm. 2/6 SEM Pulmonary/chest: Effort normal and breath sounds normal. No wheezing, rales or rhonchi. Abdominal: Soft, nondistended, nontender. . There are no masses palpable. No hepatomegaly. Extremities: no edema Lymphadenopathy: No cervical adenopathy noted. Neurological: Alert and oriented to person place and time. Skin: Skin is warm and dry. No rashes noted. Psychiatric: Normal mood and affect. Behavior is normal.   ASSESSMENT AND PLAN: 83 year old female here for assessment the following issues:  GERD - chronic, stable, no alarm symptoms or dysphagia. I discussed long term options for management. She states she failed H2 blockers in the past. I discussed long term risks of PPIs with her - recommend using lowest dose possible of PPI needed to control symptoms. Will change to 20mg  daily to every other day as needed. F/u PRN. No egd at this time, no dysphagia or alarm symptoms. She can follow up as needed for this issue.   Colon cancer screening - she asked about follow up colonoscopy. Given 2  prior exams without polyps, I think the yield of  further screening exams at her age is low. She agreed and will hold off on colonoscopy screening exams moving forward.  Yakutat Cellar, MD [facility] Pager [phone]  CC: [name], MD

## 2017-06-13 DIAGNOSIS — G5601 Carpal tunnel syndrome, right upper limb: Secondary | ICD-10-CM | POA: Diagnosis not present

## 2017-06-13 DIAGNOSIS — Z79899 Other long term (current) drug therapy: Secondary | ICD-10-CM | POA: Diagnosis not present

## 2017-06-13 DIAGNOSIS — E7800 Pure hypercholesterolemia, unspecified: Secondary | ICD-10-CM | POA: Diagnosis not present

## 2017-06-13 LAB — COMPREHENSIVE METABOLIC PANEL
ALBUMIN: 4.1 g/dL (ref 3.5–4.7)
ALK PHOS: 146 IU/L — AB (ref 39–117)
ALT: 15 IU/L (ref 0–32)
AST: 20 IU/L (ref 0–40)
Albumin/Globulin Ratio: 1.5 (ref 1.2–2.2)
BILIRUBIN TOTAL: 0.2 mg/dL (ref 0.0–1.2)
BUN / CREAT RATIO: 18 (ref 12–28)
BUN: 18 mg/dL (ref 8–27)
CHLORIDE: 102 mmol/L (ref 96–106)
CO2: 22 mmol/L (ref 20–29)
Calcium: 9.6 mg/dL (ref 8.7–10.3)
Creatinine, Ser: 1 mg/dL (ref 0.57–1.00)
GFR, EST AFRICAN AMERICAN: 61 mL/min/{1.73_m2} (ref >59)
GFR, EST NON AFRICAN AMERICAN: 53 mL/min/{1.73_m2} — AB (ref >59)
GLOBULIN, TOTAL: 2.7 g/dL (ref 1.5–4.5)
Glucose: 93 mg/dL (ref 65–99)
POTASSIUM: 4.5 mmol/L (ref 3.5–5.2)
Sodium: 140 mmol/L (ref 134–144)
Total Protein: 6.8 g/dL (ref 6.0–8.5)

## 2017-06-13 LAB — LIPID PANEL
CHOLESTEROL TOTAL: 146 mg/dL (ref 100–199)
Chol/HDL Ratio: 3.2 ratio (ref 0.0–4.4)
HDL: 46 mg/dL (ref >39)
LDL Calculated: 46 mg/dL (ref 0–99)
Triglycerides: 268 mg/dL — ABNORMAL HIGH (ref 0–149)
VLDL Cholesterol Cal: 54 mg/dL — ABNORMAL HIGH (ref 5–40)

## 2017-06-30 DIAGNOSIS — G5601 Carpal tunnel syndrome, right upper limb: Secondary | ICD-10-CM | POA: Diagnosis not present

## 2017-07-14 DIAGNOSIS — G5601 Carpal tunnel syndrome, right upper limb: Secondary | ICD-10-CM | POA: Diagnosis not present

## 2017-07-22 DIAGNOSIS — F332 Major depressive disorder, recurrent severe without psychotic features: Secondary | ICD-10-CM | POA: Diagnosis not present

## 2017-07-23 ENCOUNTER — Encounter: Payer: Self-pay | Admitting: Internal Medicine

## 2017-07-29 ENCOUNTER — Ambulatory Visit (INDEPENDENT_AMBULATORY_CARE_PROVIDER_SITE_OTHER): Payer: Medicare Other | Admitting: Internal Medicine

## 2017-07-29 ENCOUNTER — Encounter: Payer: Self-pay | Admitting: Internal Medicine

## 2017-07-29 VITALS — BP 125/48 | HR 76 | Temp 98.1°F | Ht 64.0 in | Wt 136.4 lb

## 2017-07-29 DIAGNOSIS — R011 Cardiac murmur, unspecified: Secondary | ICD-10-CM

## 2017-07-29 DIAGNOSIS — R05 Cough: Secondary | ICD-10-CM | POA: Diagnosis not present

## 2017-07-29 DIAGNOSIS — E119 Type 2 diabetes mellitus without complications: Secondary | ICD-10-CM

## 2017-07-29 DIAGNOSIS — R5383 Other fatigue: Secondary | ICD-10-CM

## 2017-07-29 DIAGNOSIS — R0602 Shortness of breath: Secondary | ICD-10-CM | POA: Diagnosis not present

## 2017-07-29 DIAGNOSIS — R059 Cough, unspecified: Secondary | ICD-10-CM | POA: Insufficient documentation

## 2017-07-29 DIAGNOSIS — E039 Hypothyroidism, unspecified: Secondary | ICD-10-CM | POA: Diagnosis not present

## 2017-07-29 DIAGNOSIS — R053 Chronic cough: Secondary | ICD-10-CM | POA: Insufficient documentation

## 2017-07-29 DIAGNOSIS — Z7902 Long term (current) use of antithrombotics/antiplatelets: Secondary | ICD-10-CM

## 2017-07-29 DIAGNOSIS — K219 Gastro-esophageal reflux disease without esophagitis: Secondary | ICD-10-CM

## 2017-07-29 DIAGNOSIS — Z79899 Other long term (current) drug therapy: Secondary | ICD-10-CM | POA: Diagnosis not present

## 2017-07-29 DIAGNOSIS — I2510 Atherosclerotic heart disease of native coronary artery without angina pectoris: Secondary | ICD-10-CM

## 2017-07-29 DIAGNOSIS — F329 Major depressive disorder, single episode, unspecified: Secondary | ICD-10-CM | POA: Diagnosis not present

## 2017-07-29 DIAGNOSIS — M790 Rheumatism, unspecified: Secondary | ICD-10-CM | POA: Insufficient documentation

## 2017-07-29 NOTE — Progress Notes (Signed)
CC: shortness of breath, clear sputum production  HPI:  Christine [name] is a 83 y.o. with a PMH of MDD, hypothyroidism, CAD on medical management, T2DM, GERD presenting to clinic for evaluation of shortness of breath and clear sputum production.  Patient states that over the last 2-3 weeks she has had fatigue and intermittent shortness of breath at rest, at exertion, and when lying down. One episode was associate with nausea while she was taking a hot shower. Other episodes don't seem to have a precipitating factor. She has intermittent and not associated 2-3 seconds long episodes of sharp chest pain. She denies LE swelling.   Patient endorses 3 day history of scant clear sputum production with cough; she denies hemoptysis, congestion, rhinorrhea, headaches. She endorses some chills yesterday but never checked her temperature. She started taking OTC cough medicine. She denies vomiting, diarrhea, abdominal pain, sick contacts.   Please see problem based Assessment and Plan for status of patients chronic conditions.  Past Medical History:  Diagnosis Date  . Anemia   . Anginal pain ([place])   . Arthritis    "back, arms, hips; hands" (11/30/2015)  . Benign hypertensive heart and kidney disease with diastolic CHF, NYHA class II and CKD stage III ([place])   . Blood transfusion 1972   "after daughter born, attempted to give me blood; couldn't give it cause my blood was cold" (09/23/2013)  . CAD (coronary artery disease)    a. LHC 08/23/11: dLM 40-50%, oRI 40%, oCFX 40%, oD1 70% (small and not amenable to PCI).  LM lesion did not appear to be flow limiting.  Medical rx was recommended;  b. Echo 08/23/11: mild LVH, EF 60-65%, grade 1 diast dysfxn, mild BAE, PASP 24;  c. 02/2012  Cath: LM 50d, LAD 50p, D1 70ost, RI 70p, RCA ok->Med Rx;  d. 01/2013 Cardiolite: EF 88, no ischemia/infarct.  . Carotid stenosis    a. dopplers 10/12:  0-39% bilat ICA;  b. 10/2012 U/S: 0-39% bilat, f/u 1 yr (10/2013).  . Chronic  bronchitis   . Depression   . Diverticulosis of colon (without mention of hemorrhage)   . GERD (gastroesophageal reflux disease)   . Heart murmur, systolic    2-D echo in December 2011 showed a normal EF with grade 1 diastolic dysfunction, trivial pulmonary regurgitation and mildly elevated PA pressure at 37 mmHg probably secondary to her COPD.dynamic obstruction-mid cavity obliteration;  b. 02/2012 Echo: EF 60-65%, mild LVH, PASP 81mmHg.  Marland Kitchen History of stomach ulcers 1970's  . Hyperlipidemia   . Hypertension   . Hypothyroidism   . Migraines    a. Next atypical symptoms in the past. Patient was started on Neurontin for possible neuropathic origin of her pain.  . Polymyalgia rheumatica ([place])   . Shortness of breath on exertion    "just related to angina >1 yr ago" (09/23/2013)  . Sinus arrhythmia   . Type II diabetes mellitus ([place])    a. On oral hypoglycemic agents.    Review of Systems:   ROS  Per HPI.   Physical Exam:  Vitals:   07/29/17 1431  BP: (!) 125/48  Pulse: 76  Temp: 98.1 F (36.7 C)  TempSrc: Oral  SpO2: 100%  Weight: 136 lb 6.4 oz (61.9 kg)  Height: 5\' 4"  (1.626 m)   Orthostatic vitals signs are negative.  GENERAL- alert, co-operative, appears as stated age, not in any distress. HEENT- Atraumatic, normocephalic, EOMI, oral mucosa appears moist, no oropharyngeal exudate CARDIAC- RRR, systolic murmur  best heard at RUSB, rubs or gallops. RESP- Moving equal volumes of air, and clear to auscultation bilaterally, no wheezes or crackles. ABDOMEN- Soft, nontender, bowel sounds present. NEURO- No obvious Cr N abnormality. EXTREMITIES- pulse 2+, symmetric, no pedal edema. SKIN- Warm, dry, no rash or lesion. PSYCH- Normal mood and affect, appropriate thought content and speech.  Assessment & Plan:   See Encounters Tab for problem based charting.   Patient discussed with Dr. Abelardo Diesel, MD Internal Medicine PGY2

## 2017-07-29 NOTE — Assessment & Plan Note (Addendum)
Patient with complaints of fatigue. Patient taking her synthroid 63mcg after PPI in the AM. Last TSH checked in July 2018 was mildly elevated at 4.7.   Plan: --advised patient to take PPI 30 minutes before dinner, and to take her synthroid alone on an empty stomach first thing in the morning and the rest of her meds one hour later. --she will f/u with PCP in ~3 weeks

## 2017-07-29 NOTE — Assessment & Plan Note (Signed)
Patient with fatigue and shortness of breath that doesn't appear to be correlated with any particular inciting event. She states that her fatigue and shortness of breath is different from prior. Exam is unremarkable.  Possibility that this is related to decreased absorption of her synthroid due to how she takes her AM meds.  Plan: --advised on proper administration of synthroid --f/u CBC today --f/u with PCP in 3 weeks

## 2017-07-29 NOTE — Assessment & Plan Note (Signed)
Patient with 3 day history of cough productive of clear sputum and one day history of chills. She denies sore throat, headache, congestion, rhinorrhea, nausea, vomiting, diarrhea, abd pain.   Exam is unremarkable for congestion, oropharyngeal exudates, and lungs are clear. She is afebrile on exam today.  Etiology likely allergic vs uri.  Plan: --continue flonase --advised to RTC if develops purulent sputum, fevers

## 2017-07-29 NOTE — Patient Instructions (Signed)
Please take your synthroid first thing in the morning and wait an hour before any other medicine you take.  Change the time you take your nexium to 30 minutes before dinner.  We'll see what effects these changes have and have you see Dr. Daryll Drown next month.   I will check your blood counts today and let you know the results.

## 2017-07-30 LAB — CBC
Hematocrit: 35.6 % (ref 34.0–46.6)
Hemoglobin: 11.6 g/dL (ref 11.1–15.9)
MCH: 28.6 pg (ref 26.6–33.0)
MCHC: 32.6 g/dL (ref 31.5–35.7)
MCV: 88 fL (ref 79–97)
PLATELETS: 322 10*3/uL (ref 150–379)
RBC: 4.05 x10E6/uL (ref 3.77–5.28)
RDW: 15.1 % (ref 12.3–15.4)
WBC: 6.3 10*3/uL (ref 3.4–10.8)

## 2017-07-31 NOTE — Progress Notes (Signed)
Internal Medicine Clinic Attending  Case discussed with Dr. Svalina  at the time of the visit.  We reviewed the resident's history and exam and pertinent patient test results.  I agree with the assessment, diagnosis, and plan of care documented in the resident's note.  

## 2017-08-06 ENCOUNTER — Telehealth: Payer: Self-pay | Admitting: Cardiovascular Disease

## 2017-08-06 NOTE — Telephone Encounter (Signed)
New message    Pt is calling asking for a call back.

## 2017-08-06 NOTE — Telephone Encounter (Signed)
S/w pt she c/o fatigue and DOE x3 weeks denies any chest pain or pressure, dizziness, etc... She would like appt with Dr Oval Linsey today or tomorrow. Informed pt that Dr Oval Linsey doe not have any openings until the end of October, she states that she needs to be seen sooner she states that she needs to come in the afternoon no appt available I did schedule appt 08-11-17 @830am  with Suanne Marker she states that she will go to the  ER if she develops any chest pain or pressure before scheduled appt on Monday, she states that she will go but does not want to go now she will go if she needs to.

## 2017-08-11 ENCOUNTER — Ambulatory Visit (INDEPENDENT_AMBULATORY_CARE_PROVIDER_SITE_OTHER): Payer: Medicare Other | Admitting: Physician Assistant

## 2017-08-11 ENCOUNTER — Telehealth: Payer: Self-pay

## 2017-08-11 ENCOUNTER — Encounter: Payer: Self-pay | Admitting: Physician Assistant

## 2017-08-11 VITALS — BP 138/64 | HR 62 | Ht 64.0 in | Wt 136.0 lb

## 2017-08-11 DIAGNOSIS — I209 Angina pectoris, unspecified: Secondary | ICD-10-CM

## 2017-08-11 DIAGNOSIS — R0600 Dyspnea, unspecified: Secondary | ICD-10-CM

## 2017-08-11 DIAGNOSIS — I25119 Atherosclerotic heart disease of native coronary artery with unspecified angina pectoris: Secondary | ICD-10-CM | POA: Diagnosis not present

## 2017-08-11 DIAGNOSIS — R079 Chest pain, unspecified: Secondary | ICD-10-CM

## 2017-08-11 DIAGNOSIS — R0609 Other forms of dyspnea: Secondary | ICD-10-CM | POA: Diagnosis not present

## 2017-08-11 LAB — BASIC METABOLIC PANEL
BUN / CREAT RATIO: 14 (ref 12–28)
BUN: 16 mg/dL (ref 8–27)
CO2: 21 mmol/L (ref 20–29)
CREATININE: 1.11 mg/dL — AB (ref 0.57–1.00)
Calcium: 9.8 mg/dL (ref 8.7–10.3)
Chloride: 104 mmol/L (ref 96–106)
GFR, EST AFRICAN AMERICAN: 54 mL/min/{1.73_m2} — AB (ref >59)
GFR, EST NON AFRICAN AMERICAN: 47 mL/min/{1.73_m2} — AB (ref >59)
GLUCOSE: 102 mg/dL — AB (ref 65–99)
Potassium: 4.3 mmol/L (ref 3.5–5.2)
Sodium: 141 mmol/L (ref 134–144)

## 2017-08-11 LAB — CBC
Hematocrit: 36.1 % (ref 34.0–46.6)
Hemoglobin: 12.2 g/dL (ref 11.1–15.9)
MCH: 29.4 pg (ref 26.6–33.0)
MCHC: 33.8 g/dL (ref 31.5–35.7)
MCV: 87 fL (ref 79–97)
PLATELETS: 346 10*3/uL (ref 150–379)
RBC: 4.15 x10E6/uL (ref 3.77–5.28)
RDW: 14.1 % (ref 12.3–15.4)
WBC: 6.2 10*3/uL (ref 3.4–10.8)

## 2017-08-11 LAB — PROTIME-INR
INR: 1 (ref 0.8–1.2)
PROTHROMBIN TIME: 10.5 s (ref 9.1–12.0)

## 2017-08-11 NOTE — Patient Instructions (Addendum)
     Christine [name] [address] [place] [address] Dept: [phone] Loc: [phone]  Christine [name]  08/11/2017  You are scheduled for a Cardiac Catheterization on Tuesday, September 25 with Dr. Peter Martinique.  1. Please arrive at the [facility] (Main Entrance A) at [facility]: [place], [place] [address] at 10:00 AM (two hours before your procedure to ensure your preparation). Free valet parking service is available.   Special note: Every effort is made to have your procedure done on time. Please understand that emergencies sometimes delay scheduled procedures.  2. Diet: Do not eat or drink anything after midnight prior to your procedure except sips of water to take medications.  3. Labs: You will need to have blood drawn on Monday, September 24 at [place], [place]  Open: [place] (Lunch 12:30 - 1:30)   Phone: [phone]. You do not need to be fasting.  4. Medication instructions in preparation for your procedure:  HOLD GLIMPERIDE TUESDAY 08-12-2017 THE DAY OF THE PROCEDURE  On the morning of your procedure, take your Aspirin  and any morning medicines NOT listed above.  You may use sips of water.  5. Plan for one night stay--bring personal belongings. 6. Bring a current list of your medications and current insurance cards. 7. You MUST have a responsible person to drive you home. 8. Someone MUST be with you the first 24 hours after you arrive home or your discharge will be delayed. 9. Please wear clothes that are easy to get on and off and wear slip-on shoes.  Thank you for allowing [place] to care for you!   -- [facility]  Medication Instructions:  NO CHANGES If you need a refill on your cardiac medications before your next appointment, please call your pharmacy.  Labwork: BMP,CBC AND  PT/INRTODAY HERE IN OUR OFFICE AT [facility]  Testing/Procedures: Your physician has recommended that you wear a holter monitor. Holter monitors are medical devices that record the heart's electrical activity. Doctors most often use these monitors to diagnose arrhythmias. Arrhythmias are problems with the speed or rhythm of the heartbeat. The monitor is a small, portable device. You can wear one while you do your normal daily activities. This is usually used to diagnose what is causing palpitations/syncope (passing out).    Thank you for choosing [facility] at [facility]!!

## 2017-08-11 NOTE — Progress Notes (Signed)
Cardiology Office Note   Date:  08/11/2017   ID:  Christine [name], DOB 01/24/1934, MRN [id number]  PCP:  [name], MD  Cardiologist:  Dr. Oval Linsey, 05/05/2017  Rosaria Ferries, PA-C   Chief Complaint  Patient presents with  . Fatigue    1 month  . Shortness of Breath    1 month    History of Present Illness: Christine [name] is a 83 y.o. female with a history of CAD w/ med rx for multivessel disease 50-70 percent by cath in 2016, HTN, HLD, PACs & PVCs on monitor, orthostatic hypotension, OA, carotid stenosis, GERD, depression  05/05/2017 office visit, blood pressure slightly elevated. No med changes because of orthostasis, systolic murmur noted  Christine [name] presents for cardiology follow up. Her son is with her today.  She felt fine on Saturday August 25th, but woke up with the DOE/SOB sx Sunday August 26th. She has not had any recent travel. No immobilization. She stays busy around the house and spends time with family including multiple great-grandchildren. Risk for PE is minimal.  She feels she is getting out of breath and tired all the time. It is not bad when sitting and lying, but happens when she does even minor housework.   Her legs do not swell. She denies PND or orthopnea. She sleeps on 2 pillows chronically because of sinus drainage.   She does ok if she walks slowly. She does not lift or carry anything because it makes her SOB. She cannot climb a flight of stairs without stopping.   She does not get chest pain that much or that often. It is only 1-2/10. She never takes anything for it because it goes away. It is not exertional. She does not get it when she is lying down or after meals.   She has had problems with bradycardia and lack of chronotropic response in the past. CPX 2016 w/ chronotropic issues>>Toprol XL reduced from 100 mg qd>>50 mg qd. Holter monitor 2017 w/ HR 40s at times and 50s regularly>>Toprol XL reduced to 25 mg qd. It went back up to 50  mg>>then was back at 25 mg 11/2016. BB stopped completely 12/31/2016 because of psych issues w/ pt refusing to swallow pills. Restarted 01/15/2017 at 12.5 mg qd.     Past Medical History:  Diagnosis Date  . Anemia   . Anginal pain ([place])   . Arthritis    "back, arms, hips; hands" (11/30/2015)  . Benign hypertensive heart and kidney disease with diastolic CHF, NYHA class II and CKD stage III ([place])   . Blood transfusion 1972   "after daughter born, attempted to give me blood; couldn't give it cause my blood was cold" (09/23/2013)  . CAD (coronary artery disease)    a. LHC 08/23/11: dLM 40-50%, oRI 40%, oCFX 40%, oD1 70% (small and not amenable to PCI).  LM lesion did not appear to be flow limiting.  Medical rx was recommended;  b. Echo 08/23/11: mild LVH, EF 60-65%, grade 1 diast dysfxn, mild BAE, PASP 24;  c. 02/2012  Cath: LM 50d, LAD 50p, D1 70ost, RI 70p, RCA ok->Med Rx;  d. 01/2013 Cardiolite: EF 88, no ischemia/infarct.  . Carotid stenosis    a. dopplers 10/12:  0-39% bilat ICA;  b. 10/2012 U/S: 0-39% bilat, f/u 1 yr (10/2013).  . Chronic bronchitis   . Depression   . Diverticulosis of colon (without mention of hemorrhage)   . GERD (gastroesophageal reflux disease)   .  Heart murmur, systolic    2-D echo in December 2011 showed a normal EF with grade 1 diastolic dysfunction, trivial pulmonary regurgitation and mildly elevated PA pressure at 37 mmHg probably secondary to her COPD.dynamic obstruction-mid cavity obliteration;  b. 02/2012 Echo: EF 60-65%, mild LVH, PASP 37mmHg.  Marland Kitchen History of stomach ulcers 1970's  . Hyperlipidemia   . Hypertension   . Hypothyroidism   . Migraines    a. Next atypical symptoms in the past. Patient was started on Neurontin for possible neuropathic origin of her pain.  . Polymyalgia rheumatica ([place])   . Shortness of breath on exertion    "just related to angina >1 yr ago" (09/23/2013)  . Sinus arrhythmia   . Type II diabetes mellitus ([place])    a. On oral  hypoglycemic agents.    Past Surgical History:  Procedure Laterality Date  . CARDIAC CATHETERIZATION N/A 09/26/2015   Procedure: Right/Left Heart Cath and Coronary Angiography;  Surgeon: Larey Dresser, MD;  Location: [facility];  Service: Cardiovascular;  Laterality: N/A;  . CATARACT EXTRACTION W/ INTRAOCULAR LENS  IMPLANT, BILATERAL Bilateral 1990's  . LEFT HEART CATHETERIZATION WITH CORONARY ANGIOGRAM N/A 03/16/2012   Procedure: LEFT HEART CATHETERIZATION WITH CORONARY ANGIOGRAM;  Surgeon: Hillary Bow, MD;  Location: [facility] C[facility] LAB;  Service: Cardiovascular;  Laterality: N/A;  . VAGINAL HYSTERECTOMY  1976    Current Outpatient Prescriptions  Medication Sig Dispense Refill  . acetaminophen (TYLENOL) 500 MG tablet Take 1,000 mg by mouth every 6 (six) hours as needed for headache. For pain    . ADVAIR DISKUS 250-50 MCG/DOSE AEPB inhale 1 dose by mouth every 12 hours 60 each 11  . amLODipine (NORVASC) 10 MG tablet Take 1 tablet (10 mg total) by mouth daily. 90 tablet 1  . Ascorbic Acid (VITAMIN C PO) Take 1 tablet by mouth daily.     Marland Kitchen atorvastatin (LIPITOR) 40 MG tablet Take 0.5 tablets (20 mg total) by mouth every evening. 90 tablet 1  . benzocaine-menthol (SORE THROAT LOZENGES) 6-10 MG lozenge Take 1 lozenge by mouth as needed for sore throat. 100 tablet 0  . clopidogrel (PLAVIX) 75 MG tablet take 1 tablet by mouth every evening 30 tablet 5  . esomeprazole (NEXIUM) 20 MG capsule Take 1 capsule (20 mg total) by mouth daily. Take 20mg  everyday to every other day as directed. 90 capsule 3  . feeding supplement, GLUCERNA SHAKE, (GLUCERNA SHAKE) LIQD Take 237 mLs by mouth 3 (three) times daily between meals. 237 mL 11  . fluticasone (FLONASE) 50 MCG/ACT nasal spray Place 1 spray into both nostrils daily. 16 g 2  . glimepiride (AMARYL) 1 MG tablet Take 1 mg by mouth daily with breakfast.    . glucose blood test strip Use as instructed 100 each 12  . HYDROcodone-acetaminophen  (NORCO/VICODIN) 5-325 MG tablet Take 1 tablet by mouth every 6 (six) hours as needed for moderate pain. 60 tablet 0  . hydroxypropyl methylcellulose (ISOPTO TEARS) 2.5 % ophthalmic solution Place 1 drop into both eyes at bedtime.     Marland Kitchen imipramine (TOFRANIL) 50 MG tablet take 1 tablet by mouth twice a day 180 tablet 3  . Lancets (ONETOUCH ULTRASOFT) lancets Use to check blood sugar two times a day Dx code E11.9 100 each 12  . levothyroxine (SYNTHROID, LEVOTHROID) 25 MCG tablet take 1 tablet by mouth once daily 90 tablet 1  . metoprolol succinate (TOPROL-XL) 25 MG 24 hr tablet Take 0.5 tablets (12.5 mg total) by  mouth daily. 90 tablet 3  . mirtazapine (REMERON) 7.5 MG tablet Take 1 tablet (7.5 mg total) by mouth at bedtime. 30 tablet 2  . Multiple Vitamin (MULTIVITAMIN WITH MINERALS) TABS tablet Take 1 tablet by mouth daily. 30 tablet 2  . NITROSTAT 0.4 MG SL tablet place 1 tablet under the tongue if needed every 5 minutes for chest pain for 3 doses IF NO RELIEF AFTER 3RD DOSE CALL PRESCRIBER OR 911. 25 tablet 3  . polycarbophil (FIBERCON) 625 MG tablet Take 1 tablet (625 mg total) by mouth daily. 30 tablet 2  . polyethylene glycol (MIRALAX / GLYCOLAX) packet Take 17 g by mouth daily as needed. 14 each 3  . promethazine (PHENERGAN) 12.5 MG tablet take 1 tablet by mouth every 6 hours if needed for nausea and vomiting 30 tablet 6  . senna (SENOKOT) 8.6 MG TABS tablet Take 1 tablet (8.6 mg total) by mouth at bedtime as needed for mild constipation. 120 each 1  . valsartan (DIOVAN) 80 MG tablet Take 1 tablet (80 mg total) by mouth daily. 90 tablet 3   No current facility-administered medications for this visit.     Allergies:   Aspirin; Atarax [hydroxyzine]; Nsaids; Codeine; Haldol [haloperidol]; Other; and Penicillins    Social History:  The patient  reports that she has never smoked. She has never used smokeless tobacco. She reports that she does not drink alcohol or use drugs.   Family History    Problem Relation Age of Onset  . Colon cancer Maternal Grandmother   . Heart attack Maternal Grandmother   . COPD Father   . Other Mother        died of unknown causes in her 29's. Pt raised by grandmother.  . Pulmonary Hypertension Daughter    Family Status  Relation Status  . MGM Deceased at age 52  . Father Deceased at age 34  . Mother Deceased  . Daughter Alive  . MGF Deceased  . PGM Deceased  . PGF Deceased  . Daughter Deceased  . Daughter Alive  . Son Alive  . Son Alive    ROS:  Please see the history of present illness. All other systems are reviewed and negative.    PHYSICAL EXAM: VS:  BP 138/64   Pulse 62   Ht 5\' 4"  (1.626 m)   Wt 136 lb (61.7 kg)   LMP 02/11/1975   BMI 23.34 kg/m  , BMI Body mass index is 23.34 kg/m. GEN: Well nourished, well developed, female in no acute distress  HEENT: normal for age  Neck: no JVD, no carotid bruit, no masses Cardiac: RRR; 3/6 murmur, no rubs, or gallops Respiratory:  clear to auscultation bilaterally, normal work of breathing GI: soft, nontender, nondistended, + BS MS: no deformity or atrophy; no edema; distal pulses are 2+ in all 4 extremities   Skin: warm and dry, no rash Neuro:  Strength and sensation are intact Psych: euthymic mood, full affect   EKG:  EKG is not ordered today.  Heart Cath, right/left: 09/26/2015 1. Low filling pressures, no evidence for CHF as cause of her dyspnea.  2. Stable coronary disease.  The 40-50% left main stenosis and the 70-80% D1 stenosis do not appear to have progressed compared to 2013 angiography.   3. I cannot explain her exertional symptoms on the basis of this procedure.  Would consider CPX.   ECHO: 09/19/2015 - Left ventricle: The cavity size was normal. Wall thickness was   increased in a  pattern of moderate LVH. Systolic function was   normal. The estimated ejection fraction was in the range of 60%   to 65%. Wall motion was normal; there were no regional wall    motion abnormalities. Left ventricular diastolic function   parameters were normal. - Atrial septum: No defect or patent foramen ovale was identified.  Recent Labs: 12/25/2016: Magnesium 1.8 05/28/2017: TSH 4.720 06/13/2017: ALT 15; BUN 18; Creatinine, Ser 1.00; Potassium 4.5; Sodium 140 07/29/2017: Hemoglobin 11.6; Platelets 322    Lipid Panel    Component Value Date/Time   CHOL 146 06/13/2017 0804   TRIG 268 (H) 06/13/2017 0804   HDL 46 06/13/2017 0804   CHOLHDL 3.2 06/13/2017 0804   CHOLHDL 4 08/05/2014 0933   VLDL 33.8 08/05/2014 0933   LDLCALC 46 06/13/2017 0804     Wt Readings from Last 3 Encounters:  08/11/17 136 lb (61.7 kg)  07/29/17 136 lb 6.4 oz (61.9 kg)  06/09/17 131 lb 4 oz (59.5 kg)     Other studies Reviewed: Additional studies/ records that were reviewed today include: Office notes hospital records and testing  ASSESSMENT AND PLAN: The case was discussed with Dr. Oval Linsey who agrees.   1.  Chest pain, dyspnea on exertion: The patient feels that her symptoms were sudden in onset. She has a history of moderate coronary artery disease treated medically. She has no symptoms of volume overload by exam and no significant risk factors for PE. I am reluctant to do a stress test because she had moderate left main disease at last cath.  The risks and benefits of a cardiac catheterization including, but not limited to, death, stroke, MI, kidney damage and bleeding were discussed with the patient and her son who indicate understanding and agree to proceed. Of note, the patient is not being given aspirin the day of the procedure because her allergy is edema. This was discovered after the AVS was printed, but the patient was advised orally. She is to take her Plavix the morning of the procedure.  2. History of chronic tropic limitation secondary to beta blockers: Her beta blocker dosage has been decreased because of bradycardia. Her heart rate today is normal, but if her heart  rate does not increase appropriately with exertion, that would make her feel tired and weak. It would account for her dyspnea on exertion as well. We will get a Holter monitor for 48 hours to follow this, after her heart catheterization. As her heart rate is normal here in the office today, continue low-dose beta blocker for now.  Current medicines are reviewed at length with the patient today.  The patient does not have concerns regarding medicines.  The following changes have been made:  no change  Labs/ tests ordered today include:   Orders Placed This Encounter  Procedures  . CBC  . Protime-INR  . Basic metabolic panel  . Holter monitor - 48 hour     Disposition:   FU with Dr. Oval Linsey  Signed, Makenlee Mckeag, Suanne Marker, PA-C  08/11/2017 3:20 PM    [place] Phone: [phone]; Fax: [phone]  This note was written with the assistance of speech recognition software. Please excuse any transcriptional errors.

## 2017-08-11 NOTE — Telephone Encounter (Signed)
New message   Pt has more questions about cath tomorrow wants to speak to [facility]

## 2017-08-11 NOTE — Telephone Encounter (Signed)
Patient contacted pre-catheterization at [facility] scheduled for:  08/12/2017 @ 1200 Verified arrival time and place:  NT @ 1000 Confirmed AM meds to be taken pre-cath with sip of water: Do not take ASA d/t allergy Take Plavix  Hold Amaryl Confirmed patient has responsible person to drive home post procedure and observe patient for 24 hours: yes Addl concerns:  Pt has allergy to ASA-Pt to take Plavix

## 2017-08-12 ENCOUNTER — Ambulatory Visit (HOSPITAL_COMMUNITY)
Admission: RE | Disposition: A | Discharge status: Home / Self Care | Payer: Self-pay | Source: Ambulatory Visit | Attending: Cardiology

## 2017-08-12 ENCOUNTER — Ambulatory Visit (HOSPITAL_COMMUNITY)
Admission: RE | Admit: 2017-08-12 | Discharge: 2017-08-12 | Disposition: A | Discharge status: Home / Self Care | Payer: Medicare Other | Source: Ambulatory Visit | Attending: Cardiology | Admitting: Cardiology

## 2017-08-12 DIAGNOSIS — Z880 Allergy status to penicillin: Secondary | ICD-10-CM | POA: Diagnosis not present

## 2017-08-12 DIAGNOSIS — I152 Hypertension secondary to endocrine disorders: Secondary | ICD-10-CM | POA: Diagnosis present

## 2017-08-12 DIAGNOSIS — I493 Ventricular premature depolarization: Secondary | ICD-10-CM | POA: Diagnosis not present

## 2017-08-12 DIAGNOSIS — F329 Major depressive disorder, single episode, unspecified: Secondary | ICD-10-CM | POA: Diagnosis not present

## 2017-08-12 DIAGNOSIS — M1990 Unspecified osteoarthritis, unspecified site: Secondary | ICD-10-CM | POA: Insufficient documentation

## 2017-08-12 DIAGNOSIS — J42 Unspecified chronic bronchitis: Secondary | ICD-10-CM | POA: Diagnosis not present

## 2017-08-12 DIAGNOSIS — M353 Polymyalgia rheumatica: Secondary | ICD-10-CM | POA: Diagnosis not present

## 2017-08-12 DIAGNOSIS — E785 Hyperlipidemia, unspecified: Secondary | ICD-10-CM | POA: Diagnosis present

## 2017-08-12 DIAGNOSIS — R0609 Other forms of dyspnea: Secondary | ICD-10-CM | POA: Diagnosis not present

## 2017-08-12 DIAGNOSIS — I5032 Chronic diastolic (congestive) heart failure: Secondary | ICD-10-CM | POA: Diagnosis not present

## 2017-08-12 DIAGNOSIS — Z7984 Long term (current) use of oral hypoglycemic drugs: Secondary | ICD-10-CM | POA: Diagnosis not present

## 2017-08-12 DIAGNOSIS — G43909 Migraine, unspecified, not intractable, without status migrainosus: Secondary | ICD-10-CM | POA: Insufficient documentation

## 2017-08-12 DIAGNOSIS — Z7951 Long term (current) use of inhaled steroids: Secondary | ICD-10-CM | POA: Insufficient documentation

## 2017-08-12 DIAGNOSIS — E1122 Type 2 diabetes mellitus with diabetic chronic kidney disease: Secondary | ICD-10-CM | POA: Insufficient documentation

## 2017-08-12 DIAGNOSIS — E039 Hypothyroidism, unspecified: Secondary | ICD-10-CM | POA: Diagnosis not present

## 2017-08-12 DIAGNOSIS — Z7902 Long term (current) use of antithrombotics/antiplatelets: Secondary | ICD-10-CM | POA: Diagnosis not present

## 2017-08-12 DIAGNOSIS — K219 Gastro-esophageal reflux disease without esophagitis: Secondary | ICD-10-CM | POA: Diagnosis not present

## 2017-08-12 DIAGNOSIS — I130 Hypertensive heart and chronic kidney disease with heart failure and stage 1 through stage 4 chronic kidney disease, or unspecified chronic kidney disease: Secondary | ICD-10-CM | POA: Insufficient documentation

## 2017-08-12 DIAGNOSIS — N183 Chronic kidney disease, stage 3 (moderate): Secondary | ICD-10-CM | POA: Insufficient documentation

## 2017-08-12 DIAGNOSIS — I951 Orthostatic hypotension: Secondary | ICD-10-CM | POA: Diagnosis not present

## 2017-08-12 DIAGNOSIS — R5383 Other fatigue: Secondary | ICD-10-CM | POA: Diagnosis present

## 2017-08-12 DIAGNOSIS — I10 Essential (primary) hypertension: Secondary | ICD-10-CM | POA: Diagnosis present

## 2017-08-12 DIAGNOSIS — I2510 Atherosclerotic heart disease of native coronary artery without angina pectoris: Secondary | ICD-10-CM | POA: Diagnosis not present

## 2017-08-12 DIAGNOSIS — R0600 Dyspnea, unspecified: Secondary | ICD-10-CM | POA: Diagnosis present

## 2017-08-12 HISTORY — PX: RIGHT/LEFT HEART CATH AND CORONARY ANGIOGRAPHY: CATH118266

## 2017-08-12 LAB — POCT I-STAT 3, VENOUS BLOOD GAS (G3P V)
ACID-BASE EXCESS: 3 mmol/L — AB (ref 0.0–2.0)
Bicarbonate: 28.6 mmol/L — ABNORMAL HIGH (ref 20.0–28.0)
O2 SAT: 78 %
PO2 VEN: 44 mmHg (ref 32.0–45.0)
TCO2: 30 mmol/L (ref 22–32)
pCO2, Ven: 48.6 mmHg (ref 44.0–60.0)
pH, Ven: 7.378 (ref 7.250–7.430)

## 2017-08-12 LAB — GLUCOSE, CAPILLARY
GLUCOSE-CAPILLARY: 50 mg/dL — AB (ref 65–99)
Glucose-Capillary: 93 mg/dL (ref 65–99)

## 2017-08-12 LAB — POCT I-STAT 3, ART BLOOD GAS (G3+)
BICARBONATE: 25.4 mmol/L (ref 20.0–28.0)
O2 SAT: 99 %
PH ART: 7.381 (ref 7.350–7.450)
TCO2: 27 mmol/L (ref 22–32)
pCO2 arterial: 42.8 mmHg (ref 32.0–48.0)
pO2, Arterial: 122 mmHg — ABNORMAL HIGH (ref 83.0–108.0)

## 2017-08-12 LAB — POCT ACTIVATED CLOTTING TIME: Activated Clotting Time: 213 seconds

## 2017-08-12 SURGERY — RIGHT/LEFT HEART CATH AND CORONARY ANGIOGRAPHY
Anesthesia: LOCAL

## 2017-08-12 MED ORDER — HEPARIN SODIUM (PORCINE) 1000 UNIT/ML IJ SOLN
INTRAMUSCULAR | Status: AC
  Filled 2017-08-12: qty 1

## 2017-08-12 MED ORDER — IOPAMIDOL (ISOVUE-370) INJECTION 76%
INTRAVENOUS | Status: DC | PRN
  Administered 2017-08-12: 60 mL via INTRA_ARTERIAL

## 2017-08-12 MED ORDER — LIDOCAINE HCL (PF) 1 % IJ SOLN
INTRAMUSCULAR | Status: DC | PRN
  Administered 2017-08-12 (×2): 2 mL via SUBCUTANEOUS

## 2017-08-12 MED ORDER — SODIUM CHLORIDE 0.9% FLUSH: 3.0000 mL | INTRAVENOUS | Status: DC | PRN

## 2017-08-12 MED ORDER — SODIUM CHLORIDE 0.9 % WEIGHT BASED INFUSION: 1.0000 mL/kg/h | INTRAVENOUS | Status: DC

## 2017-08-12 MED ORDER — SODIUM CHLORIDE 0.9% FLUSH: 3.0000 mL | Freq: Two times a day (BID) | INTRAVENOUS | Status: DC

## 2017-08-12 MED ORDER — SODIUM CHLORIDE 0.9 % WEIGHT BASED INFUSION: 1.0000 mL/kg/h | INTRAVENOUS | Status: AC

## 2017-08-12 MED ORDER — MIDAZOLAM HCL 2 MG/2ML IJ SOLN
INTRAMUSCULAR | Status: AC
  Filled 2017-08-12: qty 2

## 2017-08-12 MED ORDER — VERAPAMIL HCL 2.5 MG/ML IV SOLN
INTRAVENOUS | Status: AC
  Filled 2017-08-12: qty 2

## 2017-08-12 MED ORDER — VERAPAMIL HCL 2.5 MG/ML IV SOLN
INTRAVENOUS | Status: DC | PRN
  Administered 2017-08-12: 10 mL via INTRA_ARTERIAL

## 2017-08-12 MED ORDER — SODIUM CHLORIDE 0.9 % WEIGHT BASED INFUSION
3.0000 mL/kg/h | INTRAVENOUS | Status: AC
  Administered 2017-08-12: 3 mL/kg/h via INTRAVENOUS

## 2017-08-12 MED ORDER — FENTANYL CITRATE (PF) 100 MCG/2ML IJ SOLN
INTRAMUSCULAR | Status: DC | PRN
Start: 2017-08-12 — End: 2017-08-12
  Administered 2017-08-12: 25 ug via INTRAVENOUS

## 2017-08-12 MED ORDER — LIDOCAINE HCL 2 % IJ SOLN
INTRAMUSCULAR | Status: AC
Start: 2017-08-12 — End: 2017-08-12
  Filled 2017-08-12: qty 10

## 2017-08-12 MED ORDER — IOPAMIDOL (ISOVUE-370) INJECTION 76%
INTRAVENOUS | Status: AC
  Filled 2017-08-12: qty 100

## 2017-08-12 MED ORDER — HEPARIN (PORCINE) IN NACL 2-0.9 UNIT/ML-% IJ SOLN
INTRAMUSCULAR | Status: AC
  Filled 2017-08-12: qty 1000

## 2017-08-12 MED ORDER — CLOPIDOGREL BISULFATE 75 MG PO TABS
ORAL_TABLET | ORAL | Status: AC
  Filled 2017-08-12: qty 1

## 2017-08-12 MED ORDER — SODIUM CHLORIDE 0.9 % IV SOLN: 250.0000 mL | INTRAVENOUS | Status: DC | PRN

## 2017-08-12 MED ORDER — MIDAZOLAM HCL 2 MG/2ML IJ SOLN
INTRAMUSCULAR | Status: DC | PRN
  Administered 2017-08-12: 1 mg via INTRAVENOUS

## 2017-08-12 MED ORDER — CLOPIDOGREL BISULFATE 75 MG PO TABS: 75.0000 mg | ORAL_TABLET | ORAL | Status: DC

## 2017-08-12 MED ORDER — FENTANYL CITRATE (PF) 100 MCG/2ML IJ SOLN
INTRAMUSCULAR | Status: AC
  Filled 2017-08-12: qty 2

## 2017-08-12 MED ORDER — HEPARIN SODIUM (PORCINE) 1000 UNIT/ML IJ SOLN
INTRAMUSCULAR | Status: DC | PRN
Start: 2017-08-12 — End: 2017-08-12
  Administered 2017-08-12: 3500 [IU] via INTRAVENOUS

## 2017-08-12 MED ORDER — HEPARIN (PORCINE) IN NACL 2-0.9 UNIT/ML-% IJ SOLN
INTRAMUSCULAR | Status: AC | PRN
  Administered 2017-08-12: 1000 mL

## 2017-08-12 SURGICAL SUPPLY — 13 items
CATH 5FR JL3.5 JR4 ANG PIG MP (CATHETERS) ×1 IMPLANT
CATH BALLN WEDGE 5F 110CM (CATHETERS) ×1 IMPLANT
DEVICE RAD COMP TR BAND LRG (VASCULAR PRODUCTS) ×1 IMPLANT
GLIDESHEATH SLEND SS 6F .021 (SHEATH) ×1 IMPLANT
GUIDEWIRE INQWIRE 1.5J.035X260 (WIRE) IMPLANT
INQWIRE 1.5J .035X260CM (WIRE) ×2
KIT HEART LEFT (KITS) ×2 IMPLANT
PACK CARDIAC CATHETERIZATION (CUSTOM PROCEDURE TRAY) ×2 IMPLANT
SHEATH GLIDE SLENDER 4/5FR (SHEATH) ×1 IMPLANT
SYR MEDRAD MARK V 150ML (SYRINGE) ×1 IMPLANT
TRANSDUCER W/STOPCOCK (MISCELLANEOUS) ×2 IMPLANT
TUBING CIL FLEX 10 FLL-RA (TUBING) ×2 IMPLANT
WIRE HI TORQ VERSACORE-J 145CM (WIRE) ×1 IMPLANT

## 2017-08-12 NOTE — Discharge Instructions (Signed)
Moderate Conscious Sedation, Adult, Care After These instructions provide you with information about caring for yourself after your procedure. Your health care provider may also give you more specific instructions. Your treatment has been planned according to current medical practices, but problems sometimes occur. Call your health care provider if you have any problems or questions after your procedure. What can I expect after the procedure? After your procedure, it is common:  To feel sleepy for several hours.  To feel clumsy and have poor balance for several hours.  To have poor judgment for several hours.  To vomit if you eat too soon.  Follow these instructions at home: For at least 24 hours after the procedure:   Do not: ? Participate in activities where you could fall or become injured. ? Drive. ? Use heavy machinery. ? Drink alcohol. ? Take sleeping pills or medicines that cause drowsiness. ? Make important decisions or sign legal documents. ? Take care of children on your own.  Rest. Eating and drinking  Follow the diet recommended by your health care provider.  If you vomit: ? Drink water, juice, or soup when you can drink without vomiting. ? Make sure you have little or no nausea before eating solid foods. General instructions  Have a responsible adult stay with you until you are awake and alert.  Take over-the-counter and prescription medicines only as told by your health care provider.  If you smoke, do not smoke without supervision.  Keep all follow-up visits as told by your health care provider. This is important. Contact a health care provider if:  You keep feeling nauseous or you keep vomiting.  You feel light-headed.  You develop a rash.  You have a fever. Get help right away if:  You have trouble breathing. This information is not intended to replace advice given to you by your health care provider. Make sure you discuss any questions you have  with your health care provider. Document Released: 08/25/2013 Document Revised: 04/08/2016 Document Reviewed: 02/24/2016 Elsevier Interactive Patient Education  2018 [employer] Radial Site Care Refer to this sheet in the next few weeks. These instructions provide you with information about caring for yourself after your procedure. Your health care provider may also give you more specific instructions. Your treatment has been planned according to current medical practices, but problems sometimes occur. Call your health care provider if you have any problems or questions after your procedure. What can I expect after the procedure? After your procedure, it is typical to have the following:  Bruising at the radial site that usually fades within 1-2 weeks.  Blood collecting in the tissue (hematoma) that may be painful to the touch. It should usually decrease in size and tenderness within 1-2 weeks.  Follow these instructions at home:  Take medicines only as directed by your health care provider.  You may shower 24-48 hours after the procedure or as directed by your health care provider. Remove the bandage (dressing) and gently wash the site with plain soap and water. Pat the area dry with a clean towel. Do not rub the site, because this may cause bleeding.  Do not take baths, swim, or use a hot tub until your health care provider approves.  Check your insertion site every day for redness, swelling, or drainage.  Do not apply powder or lotion to the site.  Do not flex or bend the affected arm for 24 hours or as directed by your health care provider.  Do not   push or pull heavy objects with the affected arm for 24 hours or as directed by your health care provider.  Do not lift over 10 lb (4.5 kg) for 5 days after your procedure or as directed by your health care provider.  Ask your health care provider when it is okay to: ? Return to work or school. ? Resume usual physical activities or  sports. ? Resume sexual activity.  Do not drive home if you are discharged the same day as the procedure. Have someone else drive you.  You may drive 24 hours after the procedure unless otherwise instructed by your health care provider.  Do not operate machinery or power tools for 24 hours after the procedure.  If your procedure was done as an outpatient procedure, which means that you went home the same day as your procedure, a responsible adult should be with you for the first 24 hours after you arrive home.  Keep all follow-up visits as directed by your health care provider. This is important. Contact a health care provider if:  You have a fever.  You have chills.  You have increased bleeding from the radial site. Hold pressure on the site. Get help right away if:  You have unusual pain at the radial site.  You have redness, warmth, or swelling at the radial site.  You have drainage (other than a small amount of blood on the dressing) from the radial site.  The radial site is bleeding, and the bleeding does not stop after 30 minutes of holding steady pressure on the site.  Your arm or hand becomes pale, cool, tingly, or numb. This information is not intended to replace advice given to you by your health care provider. Make sure you discuss any questions you have with your health care provider. Document Released: 12/07/2010 Document Revised: 04/11/2016 Document Reviewed: 05/23/2014 Elsevier Interactive Patient Education  2018 Elsevier Inc.  

## 2017-08-12 NOTE — Telephone Encounter (Signed)
Attempted call back after message received yesterday. Pt did not answer, call went to VM.  Pt with cath today.

## 2017-08-12 NOTE — Interval H&P Note (Signed)
History and Physical Interval Note:  08/12/2017 12:48 PM  Christine [name]  has presented today for surgery, with the diagnosis of cp  The various methods of treatment have been discussed with the patient and family. After consideration of risks, benefits and other options for treatment, the patient has consented to  Procedure(s): RIGHT/LEFT HEART CATH AND CORONARY ANGIOGRAPHY (N/A) as a surgical intervention .  The patient's history has been reviewed, patient examined, no change in status, stable for surgery.  I have reviewed the patient's chart and labs.  Questions were answered to the patient's satisfaction.   Cath Lab Visit (complete for each Cath Lab visit)  Clinical Evaluation Leading to the Procedure:   ACS: No.  Non-ACS:    Anginal Classification: CCS II  Anti-ischemic medical therapy: No Therapy  Non-Invasive Test Results: No non-invasive testing performed  Prior CABG: No previous CABG        [name] 08/12/2017 12:48 PM

## 2017-08-12 NOTE — H&P (View-Only) (Signed)
Cardiology Office Note   Date:  08/11/2017   ID:  Christine [name], DOB 01/24/1934, MRN [id number]  PCP:  [name], MD  Cardiologist:  Dr. Oval Linsey, 05/05/2017  Rosaria Ferries, PA-C   Chief Complaint  Patient presents with  . Fatigue    1 month  . Shortness of Breath    1 month    History of Present Illness: Christine [name] is a 83 y.o. female with a history of CAD w/ med rx for multivessel disease 50-70 percent by cath in 2016, HTN, HLD, PACs & PVCs on monitor, orthostatic hypotension, OA, carotid stenosis, GERD, depression  05/05/2017 office visit, blood pressure slightly elevated. No med changes because of orthostasis, systolic murmur noted  Christine [name] presents for cardiology follow up. Her son is with her today.  She felt fine on Saturday August 25th, but woke up with the DOE/SOB sx Sunday August 26th. She has not had any recent travel. No immobilization. She stays busy around the house and spends time with family including multiple great-grandchildren. Risk for PE is minimal.  She feels she is getting out of breath and tired all the time. It is not bad when sitting and lying, but happens when she does even minor housework.   Her legs do not swell. She denies PND or orthopnea. She sleeps on 2 pillows chronically because of sinus drainage.   She does ok if she walks slowly. She does not lift or carry anything because it makes her SOB. She cannot climb a flight of stairs without stopping.   She does not get chest pain that much or that often. It is only 1-2/10. She never takes anything for it because it goes away. It is not exertional. She does not get it when she is lying down or after meals.   She has had problems with bradycardia and lack of chronotropic response in the past. CPX 2016 w/ chronotropic issues>>Toprol XL reduced from 100 mg qd>>50 mg qd. Holter monitor 2017 w/ HR 40s at times and 50s regularly>>Toprol XL reduced to 25 mg qd. It went back up to 50  mg>>then was back at 25 mg 11/2016. BB stopped completely 12/31/2016 because of psych issues w/ pt refusing to swallow pills. Restarted 01/15/2017 at 12.5 mg qd.     Past Medical History:  Diagnosis Date  . Anemia   . Anginal pain ([place])   . Arthritis    "back, arms, hips; hands" (11/30/2015)  . Benign hypertensive heart and kidney disease with diastolic CHF, NYHA class II and CKD stage III ([place])   . Blood transfusion 1972   "after daughter born, attempted to give me blood; couldn't give it cause my blood was cold" (09/23/2013)  . CAD (coronary artery disease)    a. LHC 08/23/11: dLM 40-50%, oRI 40%, oCFX 40%, oD1 70% (small and not amenable to PCI).  LM lesion did not appear to be flow limiting.  Medical rx was recommended;  b. Echo 08/23/11: mild LVH, EF 60-65%, grade 1 diast dysfxn, mild BAE, PASP 24;  c. 02/2012  Cath: LM 50d, LAD 50p, D1 70ost, RI 70p, RCA ok->Med Rx;  d. 01/2013 Cardiolite: EF 88, no ischemia/infarct.  . Carotid stenosis    a. dopplers 10/12:  0-39% bilat ICA;  b. 10/2012 U/S: 0-39% bilat, f/u 1 yr (10/2013).  . Chronic bronchitis   . Depression   . Diverticulosis of colon (without mention of hemorrhage)   . GERD (gastroesophageal reflux disease)   .  Heart murmur, systolic    2-D echo in December 2011 showed a normal EF with grade 1 diastolic dysfunction, trivial pulmonary regurgitation and mildly elevated PA pressure at 37 mmHg probably secondary to her COPD.dynamic obstruction-mid cavity obliteration;  b. 02/2012 Echo: EF 60-65%, mild LVH, PASP 22mmHg.  Marland Kitchen History of stomach ulcers 1970's  . Hyperlipidemia   . Hypertension   . Hypothyroidism   . Migraines    a. Next atypical symptoms in the past. Patient was started on Neurontin for possible neuropathic origin of her pain.  . Polymyalgia rheumatica ([place])   . Shortness of breath on exertion    "just related to angina >1 yr ago" (09/23/2013)  . Sinus arrhythmia   . Type II diabetes mellitus ([place])    a. On oral  hypoglycemic agents.    Past Surgical History:  Procedure Laterality Date  . CARDIAC CATHETERIZATION N/A 09/26/2015   Procedure: Right/Left Heart Cath and Coronary Angiography;  Surgeon: Larey Dresser, MD;  Location: [facility];  Service: Cardiovascular;  Laterality: N/A;  . CATARACT EXTRACTION W/ INTRAOCULAR LENS  IMPLANT, BILATERAL Bilateral 1990's  . LEFT HEART CATHETERIZATION WITH CORONARY ANGIOGRAM N/A 03/16/2012   Procedure: LEFT HEART CATHETERIZATION WITH CORONARY ANGIOGRAM;  Surgeon: Hillary Bow, MD;  Location: [facility]ATH [facility];  Service: Cardiovascular;  Laterality: N/A;  . VAGINAL HYSTERECTOMY  1976    Current Outpatient Prescriptions  Medication Sig Dispense Refill  . acetaminophen (TYLENOL) 500 MG tablet Take 1,000 mg by mouth every 6 (six) hours as needed for headache. For pain    . ADVAIR DISKUS 250-50 MCG/DOSE AEPB inhale 1 dose by mouth every 12 hours 60 each 11  . amLODipine (NORVASC) 10 MG tablet Take 1 tablet (10 mg total) by mouth daily. 90 tablet 1  . Ascorbic Acid (VITAMIN C PO) Take 1 tablet by mouth daily.     Marland Kitchen atorvastatin (LIPITOR) 40 MG tablet Take 0.5 tablets (20 mg total) by mouth every evening. 90 tablet 1  . benzocaine-menthol (SORE THROAT LOZENGES) 6-10 MG lozenge Take 1 lozenge by mouth as needed for sore throat. 100 tablet 0  . clopidogrel (PLAVIX) 75 MG tablet take 1 tablet by mouth every evening 30 tablet 5  . esomeprazole (NEXIUM) 20 MG capsule Take 1 capsule (20 mg total) by mouth daily. Take 20mg  everyday to every other day as directed. 90 capsule 3  . feeding supplement, GLUCERNA SHAKE, (GLUCERNA SHAKE) LIQD Take 237 mLs by mouth 3 (three) times daily between meals. 237 mL 11  . fluticasone (FLONASE) 50 MCG/ACT nasal spray Place 1 spray into both nostrils daily. 16 g 2  . glimepiride (AMARYL) 1 MG tablet Take 1 mg by mouth daily with breakfast.    . glucose blood test strip Use as instructed 100 each 12  . HYDROcodone-acetaminophen  (NORCO/VICODIN) 5-325 MG tablet Take 1 tablet by mouth every 6 (six) hours as needed for moderate pain. 60 tablet 0  . hydroxypropyl methylcellulose (ISOPTO TEARS) 2.5 % ophthalmic solution Place 1 drop into both eyes at bedtime.     Marland Kitchen imipramine (TOFRANIL) 50 MG tablet take 1 tablet by mouth twice a day 180 tablet 3  . Lancets (ONETOUCH ULTRASOFT) lancets Use to check blood sugar two times a day Dx code E11.9 100 each 12  . levothyroxine (SYNTHROID, LEVOTHROID) 25 MCG tablet take 1 tablet by mouth once daily 90 tablet 1  . metoprolol succinate (TOPROL-XL) 25 MG 24 hr tablet Take 0.5 tablets (12.5 mg total) by  mouth daily. 90 tablet 3  . mirtazapine (REMERON) 7.5 MG tablet Take 1 tablet (7.5 mg total) by mouth at bedtime. 30 tablet 2  . Multiple Vitamin (MULTIVITAMIN WITH MINERALS) TABS tablet Take 1 tablet by mouth daily. 30 tablet 2  . NITROSTAT 0.4 MG SL tablet place 1 tablet under the tongue if needed every 5 minutes for chest pain for 3 doses IF NO RELIEF AFTER 3RD DOSE CALL PRESCRIBER OR 911. 25 tablet 3  . polycarbophil (FIBERCON) 625 MG tablet Take 1 tablet (625 mg total) by mouth daily. 30 tablet 2  . polyethylene glycol (MIRALAX / GLYCOLAX) packet Take 17 g by mouth daily as needed. 14 each 3  . promethazine (PHENERGAN) 12.5 MG tablet take 1 tablet by mouth every 6 hours if needed for nausea and vomiting 30 tablet 6  . senna (SENOKOT) 8.6 MG TABS tablet Take 1 tablet (8.6 mg total) by mouth at bedtime as needed for mild constipation. 120 each 1  . valsartan (DIOVAN) 80 MG tablet Take 1 tablet (80 mg total) by mouth daily. 90 tablet 3   No current facility-administered medications for this visit.     Allergies:   Aspirin; Atarax [hydroxyzine]; Nsaids; Codeine; Haldol [haloperidol]; Other; and Penicillins    Social History:  The patient  reports that she has never smoked. She has never used smokeless tobacco. She reports that she does not drink alcohol or use drugs.   Family History    Problem Relation Age of Onset  . Colon cancer Maternal Grandmother   . Heart attack Maternal Grandmother   . COPD Father   . Other Mother        died of unknown causes in her 5's. Pt raised by grandmother.  . Pulmonary Hypertension Daughter    Family Status  Relation Status  . MGM Deceased at age 39  . Father Deceased at age 79  . Mother Deceased  . Daughter Alive  . MGF Deceased  . PGM Deceased  . PGF Deceased  . Daughter Deceased  . Daughter Alive  . Son Alive  . Son Alive    ROS:  Please see the history of present illness. All other systems are reviewed and negative.    PHYSICAL EXAM: VS:  BP 138/64   Pulse 62   Ht 5\' 4"  (1.626 m)   Wt 136 lb (61.7 kg)   LMP 02/11/1975   BMI 23.34 kg/m  , BMI Body mass index is 23.34 kg/m. GEN: Well nourished, well developed, female in no acute distress  HEENT: normal for age  Neck: no JVD, no carotid bruit, no masses Cardiac: RRR; 3/6 murmur, no rubs, or gallops Respiratory:  clear to auscultation bilaterally, normal work of breathing GI: soft, nontender, nondistended, + BS MS: no deformity or atrophy; no edema; distal pulses are 2+ in all 4 extremities   Skin: warm and dry, no rash Neuro:  Strength and sensation are intact Psych: euthymic mood, full affect   EKG:  EKG is not ordered today.  Heart Cath, right/left: 09/26/2015 1. Low filling pressures, no evidence for CHF as cause of her dyspnea.  2. Stable coronary disease.  The 40-50% left main stenosis and the 70-80% D1 stenosis do not appear to have progressed compared to 2013 angiography.   3. I cannot explain her exertional symptoms on the basis of this procedure.  Would consider CPX.   ECHO: 09/19/2015 - Left ventricle: The cavity size was normal. Wall thickness was   increased in a  pattern of moderate LVH. Systolic function was   normal. The estimated ejection fraction was in the range of 60%   to 65%. Wall motion was normal; there were no regional wall    motion abnormalities. Left ventricular diastolic function   parameters were normal. - Atrial septum: No defect or patent foramen ovale was identified.  Recent Labs: 12/25/2016: Magnesium 1.8 05/28/2017: TSH 4.720 06/13/2017: ALT 15; BUN 18; Creatinine, Ser 1.00; Potassium 4.5; Sodium 140 07/29/2017: Hemoglobin 11.6; Platelets 322    Lipid Panel    Component Value Date/Time   CHOL 146 06/13/2017 0804   TRIG 268 (H) 06/13/2017 0804   HDL 46 06/13/2017 0804   CHOLHDL 3.2 06/13/2017 0804   CHOLHDL 4 08/05/2014 0933   VLDL 33.8 08/05/2014 0933   LDLCALC 46 06/13/2017 0804     Wt Readings from Last 3 Encounters:  08/11/17 136 lb (61.7 kg)  07/29/17 136 lb 6.4 oz (61.9 kg)  06/09/17 131 lb 4 oz (59.5 kg)     Other studies Reviewed: Additional studies/ records that were reviewed today include: Office notes hospital records and testing  ASSESSMENT AND PLAN: The case was discussed with Dr. Oval Linsey who agrees.   1.  Chest pain, dyspnea on exertion: The patient feels that her symptoms were sudden in onset. She has a history of moderate coronary artery disease treated medically. She has no symptoms of volume overload by exam and no significant risk factors for PE. I am reluctant to do a stress test because she had moderate left main disease at last cath.  The risks and benefits of a cardiac catheterization including, but not limited to, death, stroke, MI, kidney damage and bleeding were discussed with the patient and her son who indicate understanding and agree to proceed. Of note, the patient is not being given aspirin the day of the procedure because her allergy is edema. This was discovered after the AVS was printed, but the patient was advised orally. She is to take her Plavix the morning of the procedure.  2. History of chronic tropic limitation secondary to beta blockers: Her beta blocker dosage has been decreased because of bradycardia. Her heart rate today is normal, but if her heart  rate does not increase appropriately with exertion, that would make her feel tired and weak. It would account for her dyspnea on exertion as well. We will get a Holter monitor for 48 hours to follow this, after her heart catheterization. As her heart rate is normal here in the office today, continue low-dose beta blocker for now.  Current medicines are reviewed at length with the patient today.  The patient does not have concerns regarding medicines.  The following changes have been made:  no change  Labs/ tests ordered today include:   Orders Placed This Encounter  Procedures  . CBC  . Protime-INR  . Basic metabolic panel  . Holter monitor - 48 hour     Disposition:   FU with Dr. Oval Linsey  Signed, Theola Cuellar, Suanne Marker, PA-C  08/11/2017 3:20 PM    [place] Phone: [phone]; Fax: [phone]  This note was written with the assistance of speech recognition software. Please excuse any transcriptional errors.

## 2017-08-13 ENCOUNTER — Encounter (HOSPITAL_COMMUNITY): Payer: Self-pay | Admitting: Cardiology

## 2017-08-13 MED FILL — Lidocaine HCl Local Inj 2%: INTRAMUSCULAR | Qty: 10 | Status: AC

## 2017-08-15 ENCOUNTER — Telehealth: Payer: Self-pay | Admitting: Cardiovascular Disease

## 2017-08-15 NOTE — Telephone Encounter (Signed)
Pt of Dr. Oval Linsey Had right & left heart catheterization this week by Dr. Martin[place]  Pt reports she is doing well, no acute concerns following procedure.   Concerning her continued symptoms of DOE and fatigue on exertion, patient states she had questions on how to proceed from here. Dr. Doug Sou guidance was to continue medical therapy.  Pt has a pending holter monitor - wants to know if she should keep this scheduled. Asking for any further advice. Informed her I would route to Dr. Oval Linsey for recommendations. Pt verbalized understanding and thanks.

## 2017-08-15 NOTE — Telephone Encounter (Signed)
Please call,pt says she needs to talk to you about her procedure she had at the hospital on Monday.

## 2017-08-20 ENCOUNTER — Ambulatory Visit (INDEPENDENT_AMBULATORY_CARE_PROVIDER_SITE_OTHER): Payer: Medicare Other | Admitting: Internal Medicine

## 2017-08-20 ENCOUNTER — Encounter: Payer: Self-pay | Admitting: Internal Medicine

## 2017-08-20 VITALS — BP 154/48 | HR 70 | Temp 97.6°F | Ht 64.0 in | Wt 138.6 lb

## 2017-08-20 DIAGNOSIS — M25561 Pain in right knee: Secondary | ICD-10-CM | POA: Diagnosis not present

## 2017-08-20 DIAGNOSIS — E119 Type 2 diabetes mellitus without complications: Secondary | ICD-10-CM | POA: Diagnosis present

## 2017-08-20 DIAGNOSIS — G5603 Carpal tunnel syndrome, bilateral upper limbs: Secondary | ICD-10-CM

## 2017-08-20 DIAGNOSIS — R5383 Other fatigue: Secondary | ICD-10-CM | POA: Diagnosis not present

## 2017-08-20 DIAGNOSIS — R0600 Dyspnea, unspecified: Secondary | ICD-10-CM

## 2017-08-20 DIAGNOSIS — E039 Hypothyroidism, unspecified: Secondary | ICD-10-CM | POA: Diagnosis not present

## 2017-08-20 DIAGNOSIS — I6529 Occlusion and stenosis of unspecified carotid artery: Secondary | ICD-10-CM | POA: Diagnosis not present

## 2017-08-20 DIAGNOSIS — I25119 Atherosclerotic heart disease of native coronary artery with unspecified angina pectoris: Secondary | ICD-10-CM

## 2017-08-20 DIAGNOSIS — Z23 Encounter for immunization: Secondary | ICD-10-CM | POA: Diagnosis not present

## 2017-08-20 DIAGNOSIS — R0609 Other forms of dyspnea: Secondary | ICD-10-CM

## 2017-08-20 LAB — GLUCOSE, CAPILLARY: GLUCOSE-CAPILLARY: 135 mg/dL — AB (ref 65–99)

## 2017-08-20 LAB — POCT GLYCOSYLATED HEMOGLOBIN (HGB A1C): Hemoglobin A1C: 6.8

## 2017-08-20 LAB — TSH: TSH: 2.792 u[IU]/mL (ref 0.350–4.500)

## 2017-08-20 LAB — CK: CK TOTAL: 174 U/L (ref 38–234)

## 2017-08-20 LAB — C-REACTIVE PROTEIN: CRP: 0.9 mg/dL (ref <1.0)

## 2017-08-20 LAB — SEDIMENTATION RATE: Sed Rate: 47 mm/hr — ABNORMAL HIGH (ref 0–22)

## 2017-08-20 MED ORDER — LIDOCAINE HCL 2 % EX CREA: TOPICAL_CREAM | CUTANEOUS | 1 refills | Status: DC

## 2017-08-20 MED ORDER — HYDROCODONE-ACETAMINOPHEN 5-325 MG PO TABS: 1.0000 | ORAL_TABLET | Freq: Four times a day (QID) | ORAL | 0 refills | Status: DC | PRN

## 2017-08-20 MED ORDER — PROMETHAZINE HCL 12.5 MG PO TABS: ORAL_TABLET | ORAL | 6 refills | Status: DC

## 2017-08-20 NOTE — Telephone Encounter (Signed)
Yes keep monitor appointment.  Her coronaries don't explain her symptoms.

## 2017-08-20 NOTE — Assessment & Plan Note (Signed)
Last imaging study done in 2014.  Recommended for repeat in 1 year.  We discussed today and she is interested in getting the study.   Plan Vascular ultrasound carotid bilateral.

## 2017-08-20 NOTE — Assessment & Plan Note (Signed)
This is a bit of a conundrum today.  She has had persistent issue for the last few months.  Cardiology did an evaluation and do not think this is cardiac.  She has been diagnosed previously with COPD per her, but PFTs are not convincing, only minimal small airway disease.  She is interested in getting a pulmonology opinion so will place a consult today.    Continue advair for now.  Follow up pulmonology recommendations.

## 2017-08-20 NOTE — Progress Notes (Signed)
   Subjective:    [name], female    DOB: 01/24/1934, 83 y.o.   MRN: [id number]  CC: 1 month follow up for fatigue  HPI  Christine [name] is an 83yo woman with PMH of CAD (non obstructive), HTN, GERD, hypothyroidism, DM2, chronic arthritis and recently fatigue who presents for follow up of fatigue.   Christine [name] has had a pretty extensive work up so far.  Lab results have been relatively normal except for an elevated TSH to around 4.  She has had L/RHC with cardiology and these showed stable disease and no right sided issues.  Her main complaint is DOE when she walks a distance (she noted that when she came for her heart catheterization walking from the car to admitting was a significant energy drainer).  She is taking Advair for COPD, though her PFTs from 2016 show relatively normal airways with only mild small airway disease.  She has persistent occasional exertional chest pain that resolves on its own along with occasional dizziness and lightheadedness.  Cardiology has ordered a holter monitor, however, the patient does not feel she needs it and is awaiting further information from Cardiology.  She has never seen a pulmonologist.  She does note issues with going upstairs and supreme fatigue while doing this.  Also fatigue with walking.  She has no issues with doing her hair or reaching things overhead.  She denies weight loss, fever, chills, night sweats.   Another issue she presents today is right knee pain.  I cannot really tell from her if this is chronic, but she notes it "comes and goes."  She has tried tylenol without relief.  She has issues with Nausea/vomiting with tramadol, has tolerated Vicodin in the past.   For her DM, her A1C today is 6.8.  She brought in her blood sugar log which we reviewed.   Review of Systems  Constitutional: Positive for activity change and fatigue. Negative for appetite change, fever and unexpected weight change.  Respiratory: Negative for cough and  shortness of breath.   Cardiovascular: Positive for chest pain.  Gastrointestinal: Negative for constipation and diarrhea.  Musculoskeletal: Positive for arthralgias and gait problem.  Neurological: Positive for dizziness and light-headedness. Negative for tremors, seizures, syncope and weakness.       Objective:   Physical Exam  Constitutional: She is oriented to person, place, and time. She appears well-developed and well-nourished. No distress.  HENT:  Head: Normocephalic and atraumatic.  Cardiovascular: Normal rate, regular rhythm, normal heart sounds and intact distal pulses.   No murmur heard. Pulmonary/Chest: Effort normal and breath sounds normal. No respiratory distress.  Abdominal: Soft. Bowel sounds are normal.  Musculoskeletal: She exhibits no edema.  Neurological: She is oriented to person, place, and time.  Psychiatric: She has a normal mood and affect. Her behavior is normal.  Vitals reviewed.   ESR, CRP, CK today along with [facility]  Pulmonology consult for opinion on respiratory issues.     Assessment & Plan:  RTC in 2 months.

## 2017-08-20 NOTE — Assessment & Plan Note (Signed)
At last check her TSH was a little high, > 4.  Dr. Jari Favre at last visit in our clinic gave education on how to take the medication.  I will recheck a TSH today to see if her thyroid medication is effective.   Plan Check TSH

## 2017-08-20 NOTE — Assessment & Plan Note (Signed)
This seems acute, but she does have OA in other areas.  No imaging in system.  Would consider doing knee xrays in the future.  Today, will try hydrocodone-apap for 5 days and lidocaine gel (she has allergy to NSAIDs)  Follow up in 2-3 months.  Consider PT as well.

## 2017-08-20 NOTE — Assessment & Plan Note (Signed)
A1C is well controlled today at 6.8.  She brought in her BS log and she has some highs around meal times.  I think at her age and with her comorbidities, hypoglycemia would be more dangerous for her.  I would not intensify her regimen today.  Foot exam done today and she has no neuropathy and pulses were intact.    Follow up in 2 months.  Continue glimepiride

## 2017-08-20 NOTE — Patient Instructions (Addendum)
Christine [name] - -  For your knee pain, please try over the counter lidocaine cream to see if this helps.  Also, I have given you a small supply of hydrocodone for bad days.   For your breathing issues, I would like for you to see pulmonology for an opinion if this could be related to your lungs.   For the narrowing in the arteries of your neck, please schedule your carotid ultrasound when you are able.   Thank you!  Please come back to see me in 2-3 months, sooner if needed.

## 2017-08-20 NOTE — Assessment & Plan Note (Signed)
She has had chronic fatigue and DOE for the last 6 months or so, possibly longer.  She was recently seen by cardiology and had a LHC/RHC which showed non obstructive disease and medical management was recommended.  She is on a statin, plavix, beta blocker which will be continued.

## 2017-08-20 NOTE — Assessment & Plan Note (Signed)
Continues to have symptoms.  No change.  She has been given advice on how to take her synthroid.  Work up as noted above in dyspnea problem.    Check ESR, CK and CRP today based on symptoms expressed.

## 2017-08-21 NOTE — Telephone Encounter (Signed)
Left message to call back  

## 2017-08-25 NOTE — Telephone Encounter (Signed)
Advised patient and she will keep. She did have to reschedule for later date secondary to her arthritis bothering her.

## 2017-09-04 ENCOUNTER — Telehealth: Payer: Self-pay | Admitting: *Deleted

## 2017-09-04 DIAGNOSIS — M25561 Pain in right knee: Secondary | ICD-10-CM | POA: Diagnosis not present

## 2017-09-04 DIAGNOSIS — M1711 Unilateral primary osteoarthritis, right knee: Secondary | ICD-10-CM | POA: Diagnosis not present

## 2017-09-04 NOTE — Telephone Encounter (Signed)
Patient called inquiring abt Pulmonology referral, appt made by [place] today. Informed patient of the date/time/place of appt.

## 2017-09-08 ENCOUNTER — Other Ambulatory Visit: Payer: Self-pay | Admitting: Physician Assistant

## 2017-09-08 ENCOUNTER — Ambulatory Visit (INDEPENDENT_AMBULATORY_CARE_PROVIDER_SITE_OTHER): Payer: Medicare Other

## 2017-09-08 ENCOUNTER — Encounter (INDEPENDENT_AMBULATORY_CARE_PROVIDER_SITE_OTHER): Payer: Self-pay

## 2017-09-08 ENCOUNTER — Other Ambulatory Visit: Payer: Self-pay | Admitting: Internal Medicine

## 2017-09-08 DIAGNOSIS — R001 Bradycardia, unspecified: Secondary | ICD-10-CM

## 2017-09-08 DIAGNOSIS — T50905A Adverse effect of unspecified drugs, medicaments and biological substances, initial encounter: Secondary | ICD-10-CM | POA: Diagnosis not present

## 2017-09-08 DIAGNOSIS — R079 Chest pain, unspecified: Secondary | ICD-10-CM | POA: Diagnosis not present

## 2017-09-08 DIAGNOSIS — R0600 Dyspnea, unspecified: Secondary | ICD-10-CM

## 2017-09-08 DIAGNOSIS — R0609 Other forms of dyspnea: Secondary | ICD-10-CM

## 2017-09-08 DIAGNOSIS — I4589 Other specified conduction disorders: Secondary | ICD-10-CM

## 2017-09-15 ENCOUNTER — Other Ambulatory Visit: Payer: Medicare Other

## 2017-09-15 ENCOUNTER — Ambulatory Visit (INDEPENDENT_AMBULATORY_CARE_PROVIDER_SITE_OTHER): Payer: Medicare Other | Admitting: Pulmonary Disease

## 2017-09-15 ENCOUNTER — Encounter: Payer: Self-pay | Admitting: Pulmonary Disease

## 2017-09-15 VITALS — BP 118/86 | HR 77 | Ht 64.0 in | Wt 137.0 lb

## 2017-09-15 DIAGNOSIS — R0600 Dyspnea, unspecified: Secondary | ICD-10-CM

## 2017-09-15 DIAGNOSIS — M069 Rheumatoid arthritis, unspecified: Secondary | ICD-10-CM

## 2017-09-15 NOTE — Progress Notes (Signed)
   Subjective:    [name], female    DOB: 01/24/1934, 83 y.o.   MRN: [id number]  HPI    Review of Systems  Constitutional: Positive for fatigue. Negative for fever and unexpected weight change.  HENT: Negative for congestion, dental problem, ear pain, nosebleeds, postnasal drip, rhinorrhea, sinus pressure, sneezing, sore throat and trouble swallowing.   Eyes: Negative for redness and itching.  Respiratory: Positive for shortness of breath. Negative for cough, chest tightness and wheezing.   Cardiovascular: Positive for chest pain and palpitations. Negative for leg swelling.  Gastrointestinal: Negative for nausea and vomiting.  Genitourinary: Negative for dysuria.  Musculoskeletal: Negative for joint swelling.  Skin: Negative for rash.  Allergic/Immunologic: Negative.  Negative for environmental allergies, food allergies and immunocompromised state.  Neurological: Positive for light-headedness and headaches.  Hematological: Bruises/bleeds easily.  Psychiatric/Behavioral: Negative for dysphoric mood. The patient is not nervous/anxious.        Objective:   Physical Exam        Assessment & Plan:

## 2017-09-15 NOTE — Patient Instructions (Addendum)
We will check rheumatoid factor, CCP, ANA with reflex panel, aldolase, d-dimer Get high-resolution CT and pulmonary function test for further evaluation of the lung function Will repeat the cardiopulmonary exercise test to see if some of the medications you are on is affecting your breathing. Continue using the Advair Follow-up in 1-2 months.

## 2017-09-15 NOTE — Progress Notes (Signed)
Christine [name]    [id number]    01/24/1934  Primary Care Physician:Mullen, Peri Jefferson, MD  Referring Physician: [name], MD [place], [place] [address]  Chief complaint: Consult for evaluation of exertional dyspnea.  HPI: 83 year old with history of polymyalgia rheumatica, hypertension, GERD, hypothyroidism, diabetes, arthritis.  Complains of exertional dyspnea, profound fatigue for the past 6 months.  She has symptoms mostly with activity, very rarely at rest.  Denies any cough, sputum production, wheezing.   She has had an evaluation by cardiology and had a right heart and left heart cath in September 2018 which shows no obstructive disease.  She had a cardiopulmonary exercise test in 2016 which showed severe chronotropic incompetence.  Her beta-blocker was decreased at that time but still continues on Toprol-XL.  History is also significant for chronic fatigue, polymyalgia rheumatica.  There is history of arthritis which was evaluated by Dr. Charlestine Night, rheumatology.  She was told that she had rheumatoid arthritis and was on prednisone for some time.  She is currently receiving steroid injections from her orthopedic doctor to the joints with good results.  Pets: None Occupation: Homemaker Exposures: Exposed to smoke from [employer] Smoking history: Never smoker Travel History: Not significant  Outpatient Encounter Prescriptions as of 09/15/2017  Medication Sig  . acetaminophen (TYLENOL) 500 MG tablet Take 1,000 mg by mouth every 6 (six) hours as needed for headache. For pain  . ADVAIR DISKUS 250-50 MCG/DOSE AEPB inhale 1 dose by mouth every 12 hours (Patient taking differently: inhale 1 puff by mouth every 12 hours)  . amLODipine (NORVASC) 10 MG tablet Take 1 tablet (10 mg total) by mouth daily.  . Ascorbic Acid (VITAMIN C PO) Take 1 tablet by mouth daily.   Marland Kitchen atorvastatin (LIPITOR) 40 MG tablet Take 0.5 tablets (20 mg total) by mouth every evening.  .  benzocaine-menthol (SORE THROAT LOZENGES) 6-10 MG lozenge Take 1 lozenge by mouth as needed for sore throat.  . clopidogrel (PLAVIX) 75 MG tablet take 1 tablet by mouth every evening (Patient taking differently: take 75mg  by mouth every evening)  . esomeprazole (NEXIUM) 20 MG capsule Take 1 capsule (20 mg total) by mouth daily. Take 20mg  everyday to every other day as directed. (Patient taking differently: Take 20 mg by mouth every other day. )  . fluticasone (FLONASE) 50 MCG/ACT nasal spray Place 1 spray into both nostrils daily.  Marland Kitchen glimepiride (AMARYL) 1 MG tablet Take 1 mg by mouth daily with breakfast.  . HYDROcodone-acetaminophen (NORCO/VICODIN) 5-325 MG tablet Take 1 tablet by mouth every 6 (six) hours as needed for moderate pain.  . hydroxypropyl methylcellulose (ISOPTO TEARS) 2.5 % ophthalmic solution Place 1 drop into both eyes at bedtime.   Marland Kitchen imipramine (TOFRANIL) 50 MG tablet take 1 tablet by mouth twice a day (Patient taking differently: take 50mg  by mouth twice a day)  . levothyroxine (SYNTHROID, LEVOTHROID) 25 MCG tablet take 1 tablet by mouth once daily  . Lidocaine HCl 2 % CREA Apply as directed up to twice daily  . metoprolol succinate (TOPROL-XL) 25 MG 24 hr tablet Take 0.5 tablets (12.5 mg total) by mouth daily. (Patient taking differently: Take 12.5 mg by mouth at bedtime. )  . mirtazapine (REMERON) 7.5 MG tablet Take 1 tablet (7.5 mg total) by mouth at bedtime.  . Multiple Vitamin (MULTIVITAMIN WITH MINERALS) TABS tablet Take 1 tablet by mouth daily.  Marland Kitchen NITROSTAT 0.4 MG SL tablet place 1  tablet under the tongue if needed every 5 minutes for chest pain for 3 doses IF NO RELIEF AFTER 3RD DOSE CALL PRESCRIBER OR 911.  . polycarbophil (FIBERCON) 625 MG tablet Take 1 tablet (625 mg total) by mouth daily.  . promethazine (PHENERGAN) 12.5 MG tablet take 1 tablet by mouth every 6 hours if needed for nausea and vomiting  . senna (SENOKOT) 8.6 MG TABS tablet Take 1 tablet (8.6 mg total) by  mouth at bedtime as needed for mild constipation.  . valsartan (DIOVAN) 80 MG tablet Take 1 tablet (80 mg total) by mouth daily. (Patient taking differently: Take 80 mg by mouth at bedtime. )   No facility-administered encounter medications on file as of 09/15/2017.     Allergies as of 09/15/2017 - Review Complete 08/20/2017  Allergen Reaction Noted  . Aspirin Swelling and Other (See Comments)   . Atarax [hydroxyzine] Other (See Comments) 12/18/2016  . Nsaids Other (See Comments) 11/25/2016  . Codeine Nausea And Vomiting and Other (See Comments)   . Haldol [haloperidol] Other (See Comments) 12/06/2016  . Other Other (See Comments) 05/08/2016  . Penicillins Diarrhea 12/29/2011    Past Medical History:  Diagnosis Date  . Anemia   . Anginal pain ([place])   . Arthritis    "back, arms, hips; hands" (11/30/2015)  . Benign hypertensive heart and kidney disease with diastolic CHF, NYHA class II and CKD stage III ([place])   . Blood transfusion 1972   "after daughter born, attempted to give me blood; couldn't give it cause my blood was cold" (09/23/2013)  . CAD (coronary artery disease)    a. LHC 08/23/11: dLM 40-50%, oRI 40%, oCFX 40%, oD1 70% (small and not amenable to PCI).  LM lesion did not appear to be flow limiting.  Medical rx was recommended;  b. Echo 08/23/11: mild LVH, EF 60-65%, grade 1 diast dysfxn, mild BAE, PASP 24;  c. 02/2012  Cath: LM 50d, LAD 50p, D1 70ost, RI 70p, RCA ok->Med Rx;  d. 01/2013 Cardiolite: EF 88, no ischemia/infarct.  . Carotid stenosis    a. dopplers 10/12:  0-39% bilat ICA;  b. 10/2012 U/S: 0-39% bilat, f/u 1 yr (10/2013).  . Chronic bronchitis   . Depression   . Diverticulosis of colon (without mention of hemorrhage)   . GERD (gastroesophageal reflux disease)   . Heart murmur, systolic    2-D echo in December 2011 showed a normal EF with grade 1 diastolic dysfunction, trivial pulmonary regurgitation and mildly elevated PA pressure at 37 mmHg probably secondary to her  COPD.dynamic obstruction-mid cavity obliteration;  b. 02/2012 Echo: EF 60-65%, mild LVH, PASP 58mmHg.  Marland Kitchen History of stomach ulcers 1970's  . Hyperlipidemia   . Hypertension   . Hypothyroidism   . Migraines    a. Next atypical symptoms in the past. Patient was started on Neurontin for possible neuropathic origin of her pain.  . Polymyalgia rheumatica ([place])   . Shortness of breath on exertion    "just related to angina >1 yr ago" (09/23/2013)  . Sinus arrhythmia   . Type II diabetes mellitus ([place])    a. On oral hypoglycemic agents.    Past Surgical History:  Procedure Laterality Date  . CARDIAC CATHETERIZATION N/A 09/26/2015   Procedure: Right/Left Heart Cath and Coronary Angiography;  Surgeon: Larey Dresser, MD;  Location: [facility];  Service: Cardiovascular;  Laterality: N/A;  . CATARACT EXTRACTION W/ INTRAOCULAR LENS  IMPLANT, BILATERAL Bilateral 1990's  . LEFT HEART CATHETERIZATION  WITH CORONARY ANGIOGRAM N/A 03/16/2012   Procedure: LEFT HEART CATHETERIZATION WITH CORONARY ANGIOGRAM;  Surgeon: Hillary Bow, MD;  Location: [facility];  Service: Cardiovascular;  Laterality: N/A;  . RIGHT/LEFT HEART CATH AND CORONARY ANGIOGRAPHY N/A 08/12/2017   Procedure: RIGHT/LEFT HEART CATH AND CORONARY ANGIOGRAPHY;  Surgeon: Martinique, Peter M, MD;  Location: [facility];  Service: Cardiovascular;  Laterality: N/A;  . VAGINAL HYSTERECTOMY  1976    Family History  Problem Relation Age of Onset  . Colon cancer Maternal Grandmother   . Heart attack Maternal Grandmother   . COPD Father   . Other Mother        died of unknown causes in her 47's. Pt raised by grandmother.  . Pulmonary Hypertension Daughter     Social History   Social History  . Marital status: Widowed    Spouse name: N/A  . Number of children: N/A  . Years of education: N/A   Occupational History  . Not on file.   Social History Main Topics  . Smoking status: Never Smoker  . Smokeless tobacco: Never Used   . Alcohol use No     Comment: 11/29/2016 "Last drink 1967"  . Drug use: No  . Sexual activity: Not Currently   Other Topics Concern  . Not on file   Social History Narrative   Never smoked. Lives in [place] with her dtr.  Care for her daughter who has had a lung transplant. Retired Building surveyor.    Review of systems: Review of Systems  Constitutional: Negative for fever and chills.  HENT: Negative.   Eyes: Negative for blurred vision.  Respiratory: as per HPI  Cardiovascular: Negative for chest pain and palpitations.  Gastrointestinal: Negative for vomiting, diarrhea, blood per rectum. Genitourinary: Negative for dysuria, urgency, frequency and hematuria.  Musculoskeletal: Negative for myalgias, back pain and joint pain.  Skin: Negative for itching and rash.  Neurological: Negative for dizziness, tremors, focal weakness, seizures and loss of consciousness.  Endo/Heme/Allergies: Negative for environmental allergies.  Psychiatric/Behavioral: Negative for depression, suicidal ideas and hallucinations.  All other systems reviewed and are negative.  Physical Exam: Last menstrual period 02/11/1975. Gen:      No acute distress HEENT:  EOMI, sclera anicteric Neck:     No masses; no thyromegaly Lungs:    Clear to auscultation bilaterally; normal respiratory effort CV:         Regular rate and rhythm; no murmurs Abd:      + bowel sounds; soft, non-tender; no palpable masses, no distension Ext:    No edema; adequate peripheral perfusion Skin:      Warm and dry; no rash Neuro: alert and oriented x 3 Psych: normal mood and affect  Data Reviewed: CT chest 10/21/16-scattered subcentimeter pulmonary nodules.  The biggest one is 4 mm in the left upper lobe.  These have been stable from 11/22/15.  No other acute lung findings.  PFTs 09/14/15 FVC 2.36 [109%], FEV1 1.83 [110%], F/F 78, TLC 90%, DLCO 87% Minimal obstructive lung disease.  PFTs 04/03/12 FVC 2.28 [100%], FEV1 1.70 (86%], F/F  75, TLC 85%, DLCO 58% Minimal obstructive lung disease, decreased diffusion capacity.  Cath 08/12/17 1. Nonobstructive CAD. Unchanged from 2013 and 2016.  2. Normal LV function 3. Normal LV filling pressures 4. Normal right heart pressures  Cardiopulmonary exercise test 10/24/15 Exercise testing with gas exchange demonstrates a mild to moderate functional impairment when compared to matched sedentary norms. Pre-exercise spirometry suggests mild obstruction. There appears  to be a significant circulatory limitation with severe chronotropic incompetence, a low OUES and a markedly elevated VeVCO22 slope. The VE/VCO2 slope is suggestive of high filling pressures but recent RHC was normal. The possibility of exercise-induced PAH or functional MR is raised. Myocardial ischemia cannot be excluded. If symptoms do not improve with a formal exercise training program consider exercise RHC.   Labs 08/20/17 CK 174, CRP 0.9, sed rate 47  Assessment:  Evaluation for dyspnea Unclear etiology for dyspnea.  There is no significant smoking history and PFTs show only minimal obstruction Given history of rheumatoid arthritis we will evaluate with a high resolution CT and PFTs for evaluation of interstitial process. Check serologies for connective tissue disease including rheumatoid factor, CCP, ANA, aldolase.  Recent labs shows very mild elevation in sed rate and normal CRP and CK Add d-dimer to rule out thromboembolism. She is on Advair but does not see any benefit to it.  We will continue this for now pending review of workup  Cardiopulmonary exercise test in the past showed severe circulatory impairment with chronotropic incompetence.  We will repeat this test to reevaluate  Plan/Recommendations: - HRCT, PFTs - Labs for eval of CTD, d dimer - Schedule cardiopulmonary exercise test - Continue Advair  Marshell Garfinkel MD  Pulmonary and Critical Care Pager [phone] 09/15/2017, 8:35  AM  CC: [name], MD

## 2017-09-16 ENCOUNTER — Encounter (HOSPITAL_COMMUNITY): Payer: Self-pay

## 2017-09-18 ENCOUNTER — Ambulatory Visit (HOSPITAL_COMMUNITY)
Admission: RE | Admit: 2017-09-18 | Discharge: 2017-09-18 | Disposition: A | Discharge status: Home / Self Care | Payer: Medicare Other | Source: Ambulatory Visit | Attending: Pulmonary Disease | Admitting: Pulmonary Disease

## 2017-09-18 ENCOUNTER — Other Ambulatory Visit: Payer: Self-pay | Admitting: Pulmonary Disease

## 2017-09-18 ENCOUNTER — Encounter (HOSPITAL_COMMUNITY): Payer: Self-pay

## 2017-09-18 DIAGNOSIS — R7989 Other specified abnormal findings of blood chemistry: Secondary | ICD-10-CM

## 2017-09-18 DIAGNOSIS — I2510 Atherosclerotic heart disease of native coronary artery without angina pectoris: Secondary | ICD-10-CM | POA: Diagnosis not present

## 2017-09-18 DIAGNOSIS — R918 Other nonspecific abnormal finding of lung field: Secondary | ICD-10-CM | POA: Diagnosis not present

## 2017-09-18 DIAGNOSIS — I700 Atherosclerosis of aorta: Secondary | ICD-10-CM | POA: Insufficient documentation

## 2017-09-18 DIAGNOSIS — I517 Cardiomegaly: Secondary | ICD-10-CM | POA: Insufficient documentation

## 2017-09-18 MED ORDER — IOPAMIDOL (ISOVUE-370) INJECTION 76%
100.0000 mL | Freq: Once | INTRAVENOUS | Status: AC | PRN
  Administered 2017-09-18: 75 mL via INTRAVENOUS

## 2017-09-18 MED ORDER — IOPAMIDOL (ISOVUE-370) INJECTION 76%
INTRAVENOUS | Status: AC
  Administered 2017-09-18: 75 mL via INTRAVENOUS
  Filled 2017-09-18: qty 100

## 2017-09-19 ENCOUNTER — Telehealth: Payer: Self-pay | Admitting: Cardiovascular Disease

## 2017-09-19 ENCOUNTER — Inpatient Hospital Stay: Admission: RE | Admit: 2017-09-19 | Payer: Self-pay | Source: Ambulatory Visit

## 2017-09-19 NOTE — Telephone Encounter (Signed)
Spoke with pt she c/o fatigue since decreasing her metoprolol to 12.5mg . She states that she is confused because why the decreased because Dr Aundra Dubin had her taking 50 mg BID 2 years ago. She states that she is tired all the time no matter what she is doing., cooking, washing the dishes, etc. She had a Cath 08-12-17 so she states that she does not know why the fatigue is happening. She is asking if the fatigue would go away if she went back to the 50 mg BID? Scheduled appt with luke 09-26-17 she states that she is unavailable before then, she has other medical appts. I tried to schedule a sooner appt but she declined.She will go to ER if she feels this is needed.

## 2017-09-19 NOTE — Telephone Encounter (Signed)
New message    Pt is calling asking for a call back. She said it was about her heart.

## 2017-09-19 NOTE — Telephone Encounter (Signed)
If anything she should feel less fatigued on lower dose of metoprolol. I would not increase back to 50 mg BID until she is seen.  Kerin Ransom PA-C 09/19/2017 5:05 PM

## 2017-09-22 ENCOUNTER — Other Ambulatory Visit: Payer: Self-pay | Admitting: Internal Medicine

## 2017-09-22 ENCOUNTER — Inpatient Hospital Stay: Admission: RE | Admit: 2017-09-22 | Payer: Self-pay | Source: Ambulatory Visit

## 2017-09-22 LAB — ANA,IFA RA DIAG PNL W/RFLX TIT/PATN
ANA: POSITIVE — AB
Cyclic Citrullin Peptide Ab: <16 UNITS

## 2017-09-22 LAB — D-DIMER, QUANTITATIVE (NOT AT ARMC): D DIMER QUANT: 1.23 ug{FEU}/mL — AB (ref <0.50)

## 2017-09-22 LAB — ANTI-NUCLEAR AB-TITER (ANA TITER): ANA Titer 1: 1:40 {titer} — ABNORMAL HIGH

## 2017-09-22 LAB — ALDOLASE: Aldolase: 2.9 U/L (ref <=8.1)

## 2017-09-22 NOTE — Progress Notes (Addendum)
Cardiology Office Note    Date:  09/23/2017   ID:  [name], DOB 01/24/1934, MRN [id number]  PCP:  [name], MD  Cardiologist: Dr. Oval Linsey   Chief Complaint  Patient presents with  . Follow-up    Evaluation of dyspnea and fatigue.     History of Present Illness:    Christine [name] is a 83 y.o. female with past medical history of CAD, HTN, HLD, orthostatic hypotension, carotid stenosis, and PAC's/PVC's who presents to the office today for evaluation of worsening fatigue.   She was evaluated by Rosaria Ferries, PA-C on 08/11/2017 and reported episodes of worsening chest pain and dyspnea on exertion. Due to the patient having a history of moderate CAD by prior cath in 2016, a repeat cardiac catheterization was recommended for definitive evaluation. This was performed on 08/12/2017 and showed nonobstructive CAD with 40% LM, 70% Ost 1st Diag, and 10% Prox-RCA stenosis. This was unchanged from her caths in 2013 and 2016, therefore continued medical management was recommended. A Holter monitor was placed in the interim and showed mostly NSR with frequent PVC's and episodes of PAT (up to 9 beats) and NSVT (up to 7 beats).   She was evaluated by her PCP in the interim and Pulmonology referral was recommended due to continued dyspnea on exertion. She was examined by Dr. Vaughan Browner on 09/15/2017 and a high-resolution CT was recommended along with PFT's. CTA showed no evidence of PE with PFT's pending.    In talking with the patient today, she continues to experience dyspnea on exertion with minimal activities such as showering. Denies any associated chest pain, palpitations, lightheadedness, dizziness, presyncope, orthopnea, PND, or lower extremity edema. Weight has been stable on her home scales. No complications regarding her right radial catheterization site.    Past Medical History:  Diagnosis Date  . Anemia   . Anginal pain ([place])   . Arthritis    "back, arms, hips; hands" (11/30/2015)   . Benign hypertensive heart and kidney disease with diastolic CHF, NYHA class II and CKD stage III ([place])   . Blood transfusion 1972   "after daughter born, attempted to give me blood; couldn't give it cause my blood was cold" (09/23/2013)  . CAD (coronary artery disease)    a. LHC 08/23/11: dLM 40-50%, oRI 40%, oCFX 40%, oD1 70% (small and not amenable to PCI).  LM lesion did not appear to be flow limiting.  Medical rx was recommended;  b. Echo 08/23/11: mild LVH, EF 60-65%, grade 1 diast dysfxn, mild BAE, PASP 24;  c. 02/2012  Cath: LM 50d, LAD 50p, D1 70ost, RI 70p, RCA ok->Med Rx;  d. 01/2013 Cardiolite: EF 88, no ischemia/infarct.  . Carotid stenosis    a. dopplers 10/12:  0-39% bilat ICA;  b. 10/2012 U/S: 0-39% bilat, f/u 1 yr (10/2013).  . Chronic bronchitis   . Depression   . Diverticulosis of colon (without mention of hemorrhage)   . GERD (gastroesophageal reflux disease)   . Heart murmur, systolic    2-D echo in December 2011 showed a normal EF with grade 1 diastolic dysfunction, trivial pulmonary regurgitation and mildly elevated PA pressure at 37 mmHg probably secondary to her COPD.dynamic obstruction-mid cavity obliteration;  b. 02/2012 Echo: EF 60-65%, mild LVH, PASP 50mmHg.  Marland Kitchen History of stomach ulcers 1970's  . Hyperlipidemia   . Hypertension   . Hypothyroidism   . Migraines    a. Next atypical symptoms in the past. Patient was started on  Neurontin for possible neuropathic origin of her pain.  . Polymyalgia rheumatica ([name])   . Shortness of breath on exertion    "just related to angina >1 yr ago" (09/23/2013)  . Sinus arrhythmia   . Type II diabetes mellitus ([place])    a. On oral hypoglycemic agents.    Past Surgical History:  Procedure Laterality Date  . CATARACT EXTRACTION W/ INTRAOCULAR LENS  IMPLANT, BILATERAL Bilateral 1990's  . VAGINAL HYSTERECTOMY  1976    Current Medications: Outpatient Medications Prior to Visit  Medication Sig Dispense Refill  . acetaminophen  (TYLENOL) 500 MG tablet Take 1,000 mg by mouth every 6 (six) hours as needed for headache. For pain    . ADVAIR DISKUS 250-50 MCG/DOSE AEPB inhale 1 dose by mouth every 12 hours 60 each 11  . amLODipine (NORVASC) 10 MG tablet Take 1 tablet (10 mg total) by mouth daily. 90 tablet 1  . Ascorbic Acid (VITAMIN C PO) Take 1 tablet by mouth daily.     Marland Kitchen atorvastatin (LIPITOR) 40 MG tablet Take 0.5 tablets (20 mg total) by mouth every evening. 90 tablet 1  . benzocaine-menthol (SORE THROAT LOZENGES) 6-10 MG lozenge Take 1 lozenge by mouth as needed for sore throat. 100 tablet 0  . clopidogrel (PLAVIX) 75 MG tablet take 1 tablet by mouth every evening (Patient taking differently: take 75mg  by mouth every evening) 30 tablet 5  . esomeprazole (NEXIUM) 20 MG capsule Take 1 capsule (20 mg total) by mouth daily. Take 20mg  everyday to every other day as directed. (Patient taking differently: Take 20 mg by mouth every other day. ) 90 capsule 3  . fluticasone (FLONASE) 50 MCG/ACT nasal spray Place 1 spray into both nostrils daily. 16 g 2  . glimepiride (AMARYL) 1 MG tablet Take 1 mg by mouth daily with breakfast.    . HYDROcodone-acetaminophen (NORCO/VICODIN) 5-325 MG tablet Take 1 tablet by mouth every 6 (six) hours as needed for moderate pain. 20 tablet 0  . hydroxypropyl methylcellulose (ISOPTO TEARS) 2.5 % ophthalmic solution Place 1 drop into both eyes at bedtime.     Marland Kitchen imipramine (TOFRANIL) 50 MG tablet take 1 tablet by mouth twice a day (Patient taking differently: take 50mg  by mouth twice a day) 180 tablet 3  . levothyroxine (SYNTHROID, LEVOTHROID) 25 MCG tablet take 1 tablet by mouth once daily 90 tablet 1  . mirtazapine (REMERON) 7.5 MG tablet Take 1 tablet (7.5 mg total) by mouth at bedtime. 30 tablet 2  . Multiple Vitamin (MULTIVITAMIN WITH MINERALS) TABS tablet Take 1 tablet by mouth daily. 30 tablet 2  . NITROSTAT 0.4 MG SL tablet place 1 tablet under the tongue if needed every 5 minutes for chest pain  for 3 doses IF NO RELIEF AFTER 3RD DOSE CALL PRESCRIBER OR 911. 25 tablet 3  . polycarbophil (FIBERCON) 625 MG tablet Take 1 tablet (625 mg total) by mouth daily. 30 tablet 2  . promethazine (PHENERGAN) 12.5 MG tablet take 1 tablet by mouth every 6 hours if needed for nausea and vomiting 30 tablet 6  . senna (SENOKOT) 8.6 MG TABS tablet Take 1 tablet (8.6 mg total) by mouth at bedtime as needed for mild constipation. 120 each 1  . valsartan (DIOVAN) 80 MG tablet Take 1 tablet (80 mg total) by mouth daily. (Patient taking differently: Take 80 mg by mouth at bedtime. ) 90 tablet 3  . metoprolol succinate (TOPROL-XL) 25 MG 24 hr tablet Take 0.5 tablets (12.5 mg total) by mouth  daily. (Patient taking differently: Take 12.5 mg by mouth at bedtime. ) 90 tablet 3   No facility-administered medications prior to visit.      Allergies:   Aspirin; Atarax [hydroxyzine]; Nsaids; Codeine; Haldol [haloperidol]; Other; and Penicillins   Social History   Socioeconomic History  . Marital status: Widowed    Spouse name: None  . Number of children: None  . Years of education: None  . Highest education level: None  Social Needs  . Financial resource strain: None  . Food insecurity - worry: None  . Food insecurity - inability: None  . Transportation needs - medical: None  . Transportation needs - non-medical: None  Occupational History  . None  Tobacco Use  . Smoking status: Never Smoker  . Smokeless tobacco: Never Used  Substance and Sexual Activity  . Alcohol use: No    Alcohol/week: 0.0 oz    Comment: 11/29/2016 "Last drink 1967"  . Drug use: No  . Sexual activity: Not Currently  Other Topics Concern  . None  Social History Narrative   Never smoked. Lives in [place] with her dtr.  Care for her daughter who has had a lung transplant. Retired Building surveyor.     Family History:  The patient's family history includes COPD in her father; Colon cancer in her maternal grandmother; Heart attack in her  maternal grandmother; Other in her mother; Pulmonary Hypertension in her daughter.   Review of Systems:   Please see the history of present illness.     General:  No chills, fever, night sweats or weight changes. Positive for fatigue.  Cardiovascular:  No chest pain, edema, orthopnea, palpitations, paroxysmal nocturnal dyspnea. Positive for dyspnea on exertion.  Dermatological: No rash, lesions/masses Respiratory: No cough, dyspnea Urologic: No hematuria, dysuria Abdominal:   No nausea, vomiting, diarrhea, bright red blood per rectum, melena, or hematemesis Neurologic:  No visual changes, wkns, changes in mental status. All other systems reviewed and are otherwise negative except as noted above.   Physical Exam:    VS:  BP 132/60   Pulse 82   Ht 5\' 4"  (1.626 m)   Wt 138 lb (62.6 kg)   LMP 02/11/1975   SpO2 99%   BMI 23.69 kg/m    General: Well developed, well nourished [place] American female appearing in no acute distress. Head: Normocephalic, atraumatic, sclera non-icteric, no xanthomas, nares are without discharge.  Neck: No carotid bruits. JVD not elevated.  Lungs: Respirations regular and unlabored, without wheezes or rales.  Heart: Regular rate and rhythm. No S3 or S4.  No murmur, no rubs, or gallops appreciated. Abdomen: Soft, non-tender, non-distended with normoactive bowel sounds. No hepatomegaly. No rebound/guarding. No obvious abdominal masses. Msk:  Strength and tone appear normal for age. No joint deformities or effusions. Extremities: No clubbing or cyanosis. No lower extremity edema.  Distal pedal pulses are 2+ bilaterally. Radial cath site stable with no ecchymosis or evidence of a hematoma.  Neuro: Alert and oriented X 3. Moves all extremities spontaneously. No focal deficits noted. Psych:  Responds to questions appropriately with a normal affect. Skin: No rashes or lesions noted  Wt Readings from Last 3 Encounters:  09/23/17 138 lb (62.6 kg)  09/15/17 137 lb  (62.1 kg)  08/20/17 138 lb 9.6 oz (62.9 kg)     Studies/Labs Reviewed:   EKG:  EKG is not ordered today.    Recent Labs: 12/25/2016: Magnesium 1.8 06/13/2017: ALT 15 08/11/2017: BUN 16; Creatinine, Ser 1.11; Hemoglobin 12.2; Platelets 346;  Potassium 4.3; Sodium 141 08/20/2017: TSH 2.792   Lipid Panel    Component Value Date/Time   CHOL 146 06/13/2017 0804   TRIG 268 (H) 06/13/2017 0804   HDL 46 06/13/2017 0804   CHOLHDL 3.2 06/13/2017 0804   CHOLHDL 4 08/05/2014 0933   VLDL 33.8 08/05/2014 0933   LDLCALC 46 06/13/2017 0804    Additional studies/ records that were reviewed today include:   Cardiac Catheterization: 07/2017  Ost LM lesion, 40 %stenosed.  LM lesion, 40 %stenosed.  Ost 1st Diag to 1st Diag lesion, 70 %stenosed.  Prox RCA to Mid RCA lesion, 10 %stenosed.  The left ventricular systolic function is normal.  LV end diastolic pressure is normal.  The left ventricular ejection fraction is 55-65% by visual estimate.  LV end diastolic pressure is normal.   1. Nonobstructive CAD. Unchanged from 2013 and 2016.  2. Normal LV function 3. Normal LV filling pressures 4. Normal right heart pressures  Plan: continue medical therapy. Based on these results I don't think her symptoms of fatigue and dyspnea are cardiac related.   Holter Monitor: 09/08/2017 Quality: Fair.  Baseline artifact. Predominant rhythm: sinus rhythm Average heart rate: 73 bpm Max heart rate: 133 bpm Min heart rate: 55 bpm  Frequent PVCs Monomorphic NSVT noted up to 7 beats PAT up to 9 beats  Assessment:    1. DOE (dyspnea on exertion)   2. Coronary artery disease due to lipid rich plaque   3. PAT (paroxysmal atrial tachycardia) ([place])   4. Essential hypertension   5. Hyperlipidemia LDL goal <70   6. Stenosis of carotid artery, unspecified laterality      Plan:   In order of problems listed above:  1. Dyspnea on Exertion/ CAD - this has been a persistent issue for the patient  over the past several months. A recent cardiac catheterization on 08/12/2017 showed nonobstructive CAD with 40% LM, 70% Ost 1st Diag, and 10% Prox-RCA stenosis which was unchanged from her caths in 2013 and 2016, therefore continued medical management was recommended. - continue Plavix, BB, and statin therapy. Will further titrate BB therapy in the setting of PAT and NSVT. Continued follow-up with Pulmonology was recommended as she is scheduled for upcoming PFT's.   2. Paroxysmal Atrial Tachycardia - recent Holter monitor showed mostly NSR with frequent PVC's and episodes of PAT (up to 9 beats) and NSVT (up to 7 beats).  - she denies any recent palpitations.  - currently on Toprol-XL 12.5mg  daily. Will further titrate to 25mg  daily.   3. HTN - BP is well-controlled at 132/60 during today's visit. - continue current medication regimen.   4. HLD - Lipid Panel in 05/2017 showed total cholesterol of 146, HDL 46, and LDL 46. At goal of LDL < 70. - continue Atorvastatin 20mg  daily.  5. Carotid Artery Stenosis - carotid dopplers in 10/2012 showed mild stenosis. Repeat dopplers have already been ordered by her PCP.    Medication Adjustments/Labs and Tests Ordered: Current medicines are reviewed at length with the patient today.  Concerns regarding medicines are outlined above.  Medication changes, Labs and Tests ordered today are listed in the Patient Instructions below. Patient Instructions  Medication Instructions: Your physician has recommended you make the following change in your medication:  -1) INCREASE Metoprolol 25 mg - Take 1 tablet by mouth daily - New RX sent to pharmacy  Labwork: None Ordered  Procedures/Testing: Your physician has requested that you have a carotid dopplers. This test is an ultrasound  of the carotid arteries in your neck. It looks at blood flow through these arteries that supply the brain with blood. Allow one hour for this exam. There are no restrictions or  special instructions.  Follow-Up: Your physician recommends that you schedule a follow-up appointment in: 3 MONTHS with Dr. Oval Linsey  If you need a refill on your cardiac medications before your next appointment, please call your pharmacy.     Signed, Erma Heritage, PA-C  09/23/2017 7:48 PM    [facility] [place], [address], [place]  [address] Phone: [phone]; Fax: [phone]  [address] [place], [place] [address] Phone: [phone]

## 2017-09-23 ENCOUNTER — Encounter: Payer: Self-pay | Admitting: Student

## 2017-09-23 ENCOUNTER — Other Ambulatory Visit: Payer: Self-pay | Admitting: Internal Medicine

## 2017-09-23 ENCOUNTER — Ambulatory Visit (INDEPENDENT_AMBULATORY_CARE_PROVIDER_SITE_OTHER): Payer: Medicare Other | Admitting: Student

## 2017-09-23 VITALS — BP 132/60 | HR 82 | Ht 64.0 in | Wt 138.0 lb

## 2017-09-23 DIAGNOSIS — E785 Hyperlipidemia, unspecified: Secondary | ICD-10-CM

## 2017-09-23 DIAGNOSIS — I2510 Atherosclerotic heart disease of native coronary artery without angina pectoris: Secondary | ICD-10-CM

## 2017-09-23 DIAGNOSIS — I6529 Occlusion and stenosis of unspecified carotid artery: Secondary | ICD-10-CM

## 2017-09-23 DIAGNOSIS — R0609 Other forms of dyspnea: Secondary | ICD-10-CM

## 2017-09-23 DIAGNOSIS — I471 Supraventricular tachycardia: Secondary | ICD-10-CM

## 2017-09-23 DIAGNOSIS — I10 Essential (primary) hypertension: Secondary | ICD-10-CM

## 2017-09-23 DIAGNOSIS — R0600 Dyspnea, unspecified: Secondary | ICD-10-CM

## 2017-09-23 DIAGNOSIS — I2583 Coronary atherosclerosis due to lipid rich plaque: Secondary | ICD-10-CM | POA: Diagnosis not present

## 2017-09-23 MED ORDER — METOPROLOL SUCCINATE ER 25 MG PO TB24: 25.0000 mg | ORAL_TABLET | Freq: Every day | ORAL | 1 refills | Status: DC

## 2017-09-23 NOTE — Patient Instructions (Signed)
Medication Instructions: Your physician has recommended you make the following change in your medication:  -1) INCREASE Metoprolol 25 mg - Take 1 tablet by mouth daily - New RX sent to pharmacy  Labwork: None Ordered  Procedures/Testing: Your physician has requested that you have a carotid dopplers. This test is an ultrasound of the carotid arteries in your neck. It looks at blood flow through these arteries that supply the brain with blood. Allow one hour for this exam. There are no restrictions or special instructions.  Follow-Up: Your physician recommends that you schedule a follow-up appointment in: 3 MONTHS with Dr. Oval Linsey  If you need a refill on your cardiac medications before your next appointment, please call your pharmacy.

## 2017-09-23 NOTE — Telephone Encounter (Signed)
Pt has app appt today to discuss

## 2017-09-25 ENCOUNTER — Ambulatory Visit (HOSPITAL_COMMUNITY): Payer: Medicare Other | Attending: Pulmonary Disease

## 2017-09-25 DIAGNOSIS — R0600 Dyspnea, unspecified: Secondary | ICD-10-CM | POA: Insufficient documentation

## 2017-09-25 DIAGNOSIS — M069 Rheumatoid arthritis, unspecified: Secondary | ICD-10-CM | POA: Diagnosis not present

## 2017-09-26 ENCOUNTER — Ambulatory Visit: Payer: Self-pay | Admitting: Cardiology

## 2017-09-29 ENCOUNTER — Encounter: Payer: Self-pay | Admitting: Pulmonary Disease

## 2017-09-29 ENCOUNTER — Ambulatory Visit (INDEPENDENT_AMBULATORY_CARE_PROVIDER_SITE_OTHER): Payer: Medicare Other | Admitting: Pulmonary Disease

## 2017-09-29 VITALS — BP 126/80 | HR 62 | Ht 64.0 in | Wt 138.0 lb

## 2017-09-29 DIAGNOSIS — R0600 Dyspnea, unspecified: Secondary | ICD-10-CM | POA: Diagnosis not present

## 2017-09-29 DIAGNOSIS — I6529 Occlusion and stenosis of unspecified carotid artery: Secondary | ICD-10-CM | POA: Diagnosis not present

## 2017-09-29 DIAGNOSIS — M069 Rheumatoid arthritis, unspecified: Secondary | ICD-10-CM

## 2017-09-29 LAB — PULMONARY FUNCTION TEST
DL/VA % pred: 93 %
DL/VA: 4.59 ml/min/mmHg/L
DLCO UNC: 17.88 ml/min/mmHg
DLCO unc % pred: 69 %
FEF 25-75 Post: 1.11 L/sec
FEF 25-75 Pre: 1.58 L/sec
FEF2575-%Change-Post: -29 %
FEF2575-%Pred-Post: 84 %
FEF2575-%Pred-Pre: 119 %
FEV1-%CHANGE-POST: -5 %
FEV1-%PRED-POST: 100 %
FEV1-%PRED-PRE: 106 %
FEV1-POST: 1.6 L
FEV1-Pre: 1.7 L
FEV1FVC-%Change-Post: -1 %
FEV1FVC-%Pred-Pre: 105 %
FEV6-%CHANGE-POST: -4 %
FEV6-%PRED-POST: 102 %
FEV6-%Pred-Pre: 106 %
FEV6-POST: 2.02 L
FEV6-PRE: 2.11 L
FEV6FVC-%CHANGE-POST: 0 %
FEV6FVC-%PRED-POST: 103 %
FEV6FVC-%PRED-PRE: 102 %
FVC-%Change-Post: -4 %
FVC-%PRED-POST: 98 %
FVC-%Pred-Pre: 103 %
FVC-Post: 2.04 L
FVC-Pre: 2.14 L
PRE FEV6/FVC RATIO: 98 %
Post FEV1/FVC ratio: 78 %
Post FEV6/FVC ratio: 99 %
Pre FEV1/FVC ratio: 79 %
RV % PRED: 82 %
RV: 2.03 L
TLC % PRED: 84 %
TLC: 4.41 L

## 2017-09-29 NOTE — Patient Instructions (Signed)
I have reviewed your tests.  They do not show any particular lung abnormality that can explain your fatigue and dyspnea I will forward the results of your test to your primary care and cardiology for further follow-up. Follow back with [place] in 3 months.

## 2017-09-29 NOTE — Progress Notes (Signed)
Christine [name]    [id number]    01/24/1934  Primary Care Physician:Mullen, Peri Jefferson, MD  Referring Physician: [name], MD [place],  [id number]  Chief complaint: Follow up for exertional dyspnea, fatigue  HPI: 83 year old with history of polymyalgia rheumatica, hypertension, GERD, hypothyroidism, diabetes, arthritis.  Complains of exertional dyspnea, profound fatigue for the past 6 months.  She has symptoms mostly with activity, very rarely at rest.  Denies any cough, sputum production, wheezing.   She has had an evaluation by cardiology and had a right heart and left heart cath in September 2018 which shows no obstructive disease.  She had a cardiopulmonary exercise test in 2016 which showed severe chronotropic incompetence.  Her beta-blocker was decreased at that time but still continues on Toprol-XL.  History is also significant for chronic fatigue, polymyalgia rheumatica.  There is history of arthritis which was evaluated by Dr. Charlestine Night, rheumatology.  She was told that she had rheumatoid arthritis and was on prednisone for some time.  She is currently receiving steroid injections from her orthopedic doctor to the joints with good results.  Pets: None Occupation: Homemaker Exposures: Exposed to smoke from [employer] Smoking history: Never smoker Travel History: Not significant  Interim history: Christine [name] here for review of her tests including CT scan, PFTs and cardio pulmonary exercise test.  She continues to have dyspnea on exertion and fatigue and weakness which is unchanged since last visit.  Outpatient Encounter Medications as of 09/29/2017  Medication Sig  . acetaminophen (TYLENOL) 500 MG tablet Take 1,000 mg by mouth every 6 (six) hours as needed for headache. For pain  . ADVAIR DISKUS 250-50 MCG/DOSE AEPB inhale 1 dose by mouth every 12 hours  . amLODipine (NORVASC) 10 MG tablet take 1 tablet by mouth once daily  . Ascorbic Acid  (VITAMIN C PO) Take 1 tablet by mouth daily.   Marland Kitchen atorvastatin (LIPITOR) 40 MG tablet Take 0.5 tablets (20 mg total) by mouth every evening.  . benzocaine-menthol (SORE THROAT LOZENGES) 6-10 MG lozenge Take 1 lozenge by mouth as needed for sore throat.  . clopidogrel (PLAVIX) 75 MG tablet take 1 tablet by mouth every evening (Patient taking differently: take 75mg  by mouth every evening)  . esomeprazole (NEXIUM) 20 MG capsule Take 1 capsule (20 mg total) by mouth daily. Take 20mg  everyday to every other day as directed. (Patient taking differently: Take 20 mg by mouth every other day. )  . fluticasone (FLONASE) 50 MCG/ACT nasal spray Place 1 spray into both nostrils daily.  Marland Kitchen glimepiride (AMARYL) 1 MG tablet Take 1 mg by mouth daily with breakfast.  . HYDROcodone-acetaminophen (NORCO/VICODIN) 5-325 MG tablet Take 1 tablet by mouth every 6 (six) hours as needed for moderate pain.  . hydroxypropyl methylcellulose (ISOPTO TEARS) 2.5 % ophthalmic solution Place 1 drop into both eyes at bedtime.   Marland Kitchen imipramine (TOFRANIL) 50 MG tablet take 1 tablet by mouth twice a day (Patient taking differently: take 50mg  by mouth twice a day)  . levothyroxine (SYNTHROID, LEVOTHROID) 25 MCG tablet take 1 tablet by mouth once daily  . metoprolol succinate (TOPROL-XL) 25 MG 24 hr tablet Take 1 tablet (25 mg total) daily by mouth.  . mirtazapine (REMERON) 7.5 MG tablet Take 1 tablet (7.5 mg total) by mouth at bedtime.  . Multiple Vitamin (MULTIVITAMIN WITH MINERALS) TABS tablet Take 1 tablet by mouth daily.  Marland Kitchen NITROSTAT 0.4 MG SL tablet place 1  tablet under the tongue if needed every 5 minutes for chest pain for 3 doses IF NO RELIEF AFTER 3RD DOSE CALL PRESCRIBER OR 911.  . polycarbophil (FIBERCON) 625 MG tablet Take 1 tablet (625 mg total) by mouth daily.  . promethazine (PHENERGAN) 12.5 MG tablet take 1 tablet by mouth every 6 hours if needed for nausea and vomiting  . senna (SENOKOT) 8.6 MG TABS tablet Take 1 tablet (8.6  mg total) by mouth at bedtime as needed for mild constipation.  . valsartan (DIOVAN) 80 MG tablet Take 1 tablet (80 mg total) by mouth daily. (Patient taking differently: Take 80 mg by mouth at bedtime. )   No facility-administered encounter medications on file as of 09/29/2017.     Allergies as of 09/29/2017 - Review Complete 09/29/2017  Allergen Reaction Noted  . Aspirin Swelling and Other (See Comments)   . Atarax [hydroxyzine] Other (See Comments) 12/18/2016  . Nsaids Other (See Comments) 11/25/2016  . Codeine Nausea And Vomiting and Other (See Comments)   . Haldol [haloperidol] Other (See Comments) 12/06/2016  . Other Other (See Comments) 05/08/2016  . Penicillins Diarrhea 12/29/2011    Past Medical History:  Diagnosis Date  . Anemia   . Anginal pain ([place])   . Arthritis    "back, arms, hips; hands" (11/30/2015)  . Benign hypertensive heart and kidney disease with diastolic CHF, NYHA class II and CKD stage III ([place])   . Blood transfusion 1972   "after daughter born, attempted to give me blood; couldn't give it cause my blood was cold" (09/23/2013)  . CAD (coronary artery disease)    a. LHC 08/23/11: dLM 40-50%, oRI 40%, oCFX 40%, oD1 70% (small and not amenable to PCI).  LM lesion did not appear to be flow limiting.  Medical rx was recommended;  b. Echo 08/23/11: mild LVH, EF 60-65%, grade 1 diast dysfxn, mild BAE, PASP 24;  c. 02/2012  Cath: LM 50d, LAD 50p, D1 70ost, RI 70p, RCA ok->Med Rx;  d. 01/2013 Cardiolite: EF 88, no ischemia/infarct.  . Carotid stenosis    a. dopplers 10/12:  0-39% bilat ICA;  b. 10/2012 U/S: 0-39% bilat, f/u 1 yr (10/2013).  . Chronic bronchitis   . Depression   . Diverticulosis of colon (without mention of hemorrhage)   . GERD (gastroesophageal reflux disease)   . Heart murmur, systolic    2-D echo in December 2011 showed a normal EF with grade 1 diastolic dysfunction, trivial pulmonary regurgitation and mildly elevated PA pressure at 37 mmHg probably  secondary to her COPD.dynamic obstruction-mid cavity obliteration;  b. 02/2012 Echo: EF 60-65%, mild LVH, PASP 23mmHg.  Marland Kitchen History of stomach ulcers 1970's  . Hyperlipidemia   . Hypertension   . Hypothyroidism   . Migraines    a. Next atypical symptoms in the past. Patient was started on Neurontin for possible neuropathic origin of her pain.  . Polymyalgia rheumatica ([place])   . Shortness of breath on exertion    "just related to angina >1 yr ago" (09/23/2013)  . Sinus arrhythmia   . Type II diabetes mellitus ([place])    a. On oral hypoglycemic agents.    Past Surgical History:  Procedure Laterality Date  . CATARACT EXTRACTION W/ INTRAOCULAR LENS  IMPLANT, BILATERAL Bilateral 1990's  . VAGINAL HYSTERECTOMY  1976    Family History  Problem Relation Age of Onset  . Colon cancer Maternal Grandmother   . Heart attack Maternal Grandmother   . COPD Father   .  Other Mother        died of unknown causes in her 71's. Pt raised by grandmother.  . Pulmonary Hypertension Daughter     Social History   Socioeconomic History  . Marital status: Widowed    Spouse name: Not on file  . Number of children: Not on file  . Years of education: Not on file  . Highest education level: Not on file  Social Needs  . Financial resource strain: Not on file  . Food insecurity - worry: Not on file  . Food insecurity - inability: Not on file  . Transportation needs - medical: Not on file  . Transportation needs - non-medical: Not on file  Occupational History  . Not on file  Tobacco Use  . Smoking status: Never Smoker  . Smokeless tobacco: Never Used  Substance and Sexual Activity  . Alcohol use: No    Alcohol/week: 0.0 oz    Comment: 11/29/2016 "Last drink 1967"  . Drug use: No  . Sexual activity: Not Currently  Other Topics Concern  . Not on file  Social History Narrative   Never smoked. Lives in [place] with her dtr.  Care for her daughter who has had a lung transplant. Retired Building surveyor.     Review of systems: Review of Systems  Constitutional: Negative for fever and chills.  HENT: Negative.   Eyes: Negative for blurred vision.  Respiratory: as per HPI  Cardiovascular: Negative for chest pain and palpitations.  Gastrointestinal: Negative for vomiting, diarrhea, blood per rectum. Genitourinary: Negative for dysuria, urgency, frequency and hematuria.  Musculoskeletal: Negative for myalgias, back pain and joint pain.  Skin: Negative for itching and rash.  Neurological: Negative for dizziness, tremors, focal weakness, seizures and loss of consciousness.  Endo/Heme/Allergies: Negative for environmental allergies.  Psychiatric/Behavioral: Negative for depression, suicidal ideas and hallucinations.  All other systems reviewed and are negative.  Physical Exam: Last menstrual period 02/11/1975. Gen:      No acute distress HEENT:  EOMI, sclera anicteric Neck:     No masses; no thyromegaly Lungs:    Clear to auscultation bilaterally; normal respiratory effort CV:         Regular rate and rhythm; no murmurs Abd:      + bowel sounds; soft, non-tender; no palpable masses, no distension Ext:    No edema; adequate peripheral perfusion Skin:      Warm and dry; no rash Neuro: alert and oriented x 3 Psych: normal mood and affect  Data Reviewed: CT chest 10/21/16-scattered subcentimeter pulmonary nodules.  The biggest one is 4 mm in the left upper lobe.  These have been stable from 11/22/15.  No other acute lung findings. CT angio 09/18/17-no pulmonary embolism, scattered subcentimeter pulmonary nodule.  No other acute lung abnormality. I reviewed all images personally.  PFTs 04/03/12 FVC 2.28 [100%], FEV1 1.70 (86%], F/F 75, TLC 85%, DLCO 58% Minimal obstructive lung disease, decreased diffusion capacity.  PFTs 09/14/15 FVC 2.36 [109%], FEV1 1.83 [110%], F/F 78, TLC 90%, DLCO 87% Minimal obstructive lung disease.  PFTs 09/29/17 FVC 2.04 [98%], FEV1 1.60 [100%], F/F 78, TLC 84%,  DLCO 69% Minimal obstruction, mild diffusion defect that corrects for alveolar volume.  Cath 08/12/17 1. Nonobstructive CAD. Unchanged from 2013 and 2016.  2. Normal LV function 3. Normal LV filling pressures 4. Normal right heart pressures  Cardiopulmonary exercise test 10/24/15 Exercise testing with gas exchange demonstrates a mild to moderate functional impairment when compared to matched sedentary norms. Pre-exercise spirometry suggests  mild obstruction. There appears to be a significant circulatory limitation with severe chronotropic incompetence, a low OUES and a markedly elevated VeVCO22 slope. The VE/VCO2 slope is suggestive of high filling pressures but recent RHC was normal. The possibility of exercise-induced PAH or functional MR is raised. Myocardial ischemia cannot be excluded. If symptoms do not improve with a formal exercise training program consider exercise RHC.   Cardiopulmonary exercise test 09/26/17 Exercise testing with gas exchange demonstrates moderately reduced functional impairment when compared to matched sedentary norms. There is mild indication for circulatory limitation given elevated VE/VCO2 slope. There was significant chronotropic incompetence. Despite patient's symptoms, results have mildly increased and improved with similar limitations as CPX in 2016.  Labs 08/20/17 CK 174, CRP 0.9, sed rate 47  Labs 09/15/13 Aldolase 2.9, CK 174, ANA 1:40, nucleolar pattern Rheumatoid factor, CCP-negative  Assessment:  Evaluation for dyspnea Unclear etiology for dyspnea.  There is no significant smoking history and PFTs show only minimal obstruction. There is mild diffusion defect that corrects for alveolar volume and no evidence of interstitial lung disease on CT scan.  Pulmonary embolism has been ruled out on CT scan. Serologies for connective tissue disease including rheumatoid factor, CCP, aldolase is negative ANA shows very mild elevation which is likely  nonspecific.  Overall there is no evidence of lung disease to explain her symptoms.  Repeat cardiopulmonary exercise test continues to show chronotropic incompetence.  We will forward this to the cardiology service for review and see if the beta-blocker can be adjusted.  Her main symptom appears to be profound fatigue and I wonder if this is a manifestation of polymyalgia rheumatica and if she would benefit from low-dose steroid. She had been on prednisone in the past for the same issue.  Will cc Dr. Daryll Drown, PCP for evaluation.  Plan/Recommendations: - Follow-up with cardiology for adjustment of beta-blocker given exercise test findings of chronotropic incompetence. - Follow-up with primary care  Marshell Garfinkel MD [facility] Pager [phone] 09/29/2017, 12:24 PM  CC: [name], MD

## 2017-09-29 NOTE — Progress Notes (Signed)
PFT done today by Emilio Baylock, CMA  

## 2017-10-02 ENCOUNTER — Other Ambulatory Visit: Payer: Self-pay | Admitting: Internal Medicine

## 2017-10-02 NOTE — Telephone Encounter (Signed)
Patient is requesting medicine

## 2017-10-03 ENCOUNTER — Encounter: Payer: Self-pay | Admitting: Pulmonary Disease

## 2017-10-03 NOTE — Telephone Encounter (Signed)
Next appt scheduled  11/26/17 with PCP.

## 2017-10-08 DIAGNOSIS — F332 Major depressive disorder, recurrent severe without psychotic features: Secondary | ICD-10-CM | POA: Diagnosis not present

## 2017-10-13 ENCOUNTER — Telehealth: Payer: Self-pay | Admitting: Pulmonary Disease

## 2017-10-13 ENCOUNTER — Telehealth: Payer: Self-pay | Admitting: Cardiovascular Disease

## 2017-10-13 NOTE — Telephone Encounter (Signed)
Spoke with pt, who states during her OV on 09/29/17 PM asked her if she was having any neck pain. Pt states this morning she developed neck pain into shoulders with left arm pain. Pt also reports of nausea.   PM please advise. Thanks.

## 2017-10-13 NOTE — Telephone Encounter (Signed)
Returned call to pt. C/o neck pain w new onset this AM, states she also has shoulder pain and radiating to left arm. Denies dyspnea, fatigue, chest pain, jaw pain, dizziness, lightheadedness, or other symptoms.  She is still having the pain this afternoon but it's improved since starting this morning.  Unsure if movement/massage changes it or not. She states she called Dr. Matilde Bash office already who recommended she contact us or go to the hospital.  She does note hx of arthritis  Informed her I agreed w ED evaluation, not confident enough in it being MSK/arthritis to advise differently. Pt voiced agreement & understanding to go to ED to evaluate. Will forward to Dr. Oval Linsey as Juluis Rainier.

## 2017-10-13 NOTE — Telephone Encounter (Signed)
Pt is aware of PM's recommendations and voiced her understanding. Nothing further needed.

## 2017-10-13 NOTE — Telephone Encounter (Addendum)
This may represent cardiac issue. Please ask her to contact her cardiologist or go to the ED for evaluation.

## 2017-10-13 NOTE — Telephone Encounter (Signed)
New message      Patient wants to speak to you about neck pain in the back of neck   Has not checked her vitals , just having very very bad pain in back of neck and head and left arm .

## 2017-10-20 ENCOUNTER — Other Ambulatory Visit: Payer: Self-pay | Admitting: Internal Medicine

## 2017-10-20 ENCOUNTER — Ambulatory Visit (HOSPITAL_COMMUNITY)
Admission: RE | Admit: 2017-10-20 | Discharge: 2017-10-20 | Disposition: A | Discharge status: Home / Self Care | Payer: Medicare Other | Source: Ambulatory Visit | Attending: Cardiovascular Disease | Admitting: Cardiovascular Disease

## 2017-10-20 DIAGNOSIS — I6523 Occlusion and stenosis of bilateral carotid arteries: Secondary | ICD-10-CM | POA: Diagnosis not present

## 2017-10-20 DIAGNOSIS — I6529 Occlusion and stenosis of unspecified carotid artery: Secondary | ICD-10-CM | POA: Diagnosis not present

## 2017-10-21 ENCOUNTER — Telehealth: Payer: Self-pay | Admitting: *Deleted

## 2017-10-21 NOTE — Telephone Encounter (Signed)
-----   Message from Erma Heritage, [place] sent at 10/21/2017  8:46 AM EST ----- Please let the patient know her carotid dopplers showed no significant stenosis. No further testing indicated at this time. Thank you.

## 2017-10-21 NOTE — Telephone Encounter (Signed)
Called pt regarding her results. She voiced concern that she's been "having the same problem I've been having". Describing dyspnea on exertion. No other concerns stated though I do note she called regarding shoulder pain the other day. She did not indicate these two problems were directly related. She does wish to follow up w Dr. Oval Linsey to discuss her concerns. I offered appt tomorrow as Dr. Oval Linsey has opening. I recommended she go to ED should she develop worsening symptoms, or new problems such as chest pain, lightheadedness, or syncope. Pt verbalized understanding and thanks.

## 2017-10-22 ENCOUNTER — Encounter: Payer: Self-pay | Admitting: Cardiovascular Disease

## 2017-10-22 ENCOUNTER — Ambulatory Visit (INDEPENDENT_AMBULATORY_CARE_PROVIDER_SITE_OTHER): Payer: Medicare Other | Admitting: Cardiovascular Disease

## 2017-10-22 VITALS — BP 138/50 | HR 72 | Ht 64.0 in | Wt 139.0 lb

## 2017-10-22 DIAGNOSIS — I6529 Occlusion and stenosis of unspecified carotid artery: Secondary | ICD-10-CM | POA: Diagnosis not present

## 2017-10-22 DIAGNOSIS — I208 Other forms of angina pectoris: Secondary | ICD-10-CM | POA: Diagnosis not present

## 2017-10-22 DIAGNOSIS — I209 Angina pectoris, unspecified: Secondary | ICD-10-CM | POA: Diagnosis not present

## 2017-10-22 DIAGNOSIS — R0602 Shortness of breath: Secondary | ICD-10-CM | POA: Diagnosis not present

## 2017-10-22 DIAGNOSIS — R0600 Dyspnea, unspecified: Secondary | ICD-10-CM

## 2017-10-22 DIAGNOSIS — R001 Bradycardia, unspecified: Secondary | ICD-10-CM

## 2017-10-22 DIAGNOSIS — R0609 Other forms of dyspnea: Secondary | ICD-10-CM | POA: Diagnosis not present

## 2017-10-22 DIAGNOSIS — I4589 Other specified conduction disorders: Secondary | ICD-10-CM | POA: Diagnosis not present

## 2017-10-22 NOTE — Progress Notes (Signed)
Cardiology Office Note   Date:  10/22/2017   ID:  Christine [name], DOB 01/24/1934, MRN [id number]  PCP:  [name], MD  Cardiologist:   Skeet Latch, MD   No chief complaint on file.    History of Present Illness: Christine [name] is an 83 y.o. female with CAD, carotid stenosis, chronotropic hypertension, hyperlipidemia, hypothyroidism, and diabetes who presents for follow up.  Christine [name] was previously a patient of Dr. Aundra Dubin.  She initially established care 08/2011 when she presented with chest pain. She had a left heart catheterization that revealed 40-50% left main stenosis and a 75% diagonal lesion that were medically managed.  She had a repeat cath 09/2015 that was unchanged.  CPX 10/2015 revealed mild obstructive pulmonary disease and severe chronotropic incompetence.  Metoprolol was decreased.  There was also concern for possible exercise-induced pulmonary hypertension or functional MR.  Christine [name] had a 48 hour Holter 03/2016 that showed occasional PACs and PVCs.  In 2017 she had orthostatic hypotension.  She was started on compression stockings and pyridostigmine. She did not tolerate this due to dizziness.  She saw Christine Ferries, PA-C, on 07/2017 and reported exertional dyspnea.  She also noted mild chest pain.  She was referred for heart catheterization that revealed no significant changes from prior.  She had 40% left main, 70% ostial D1, and 10% proximal RCA disease.  LVEF was 55-65%.  LVEDP was normal.  Christine [name] followed up with Dr. Vaughan Browner on 09/15/17 and continues to report shortness of breath and fatigue.  She was referred for a CPX 09/26/17 that revealed chronotropic incompetence.  Her heart rate increased from 61 bpm at rest to 87 at max.  Between seeing her pulmonologist and actually having the CPX her metoprolol was increased to 25 mg daily.  She continues to have significant fatigue with exertion.  She gets short of breath with minimal exertion such as walking around her  home.  Just getting onto the exam room table was a struggle for her.  She denies any chest pain or pressure.  She has not noted any lower extremity edema, orthopnea, or PND.   Past Medical History:  Diagnosis Date  . Anemia   . Anginal pain ([place])   . Arthritis    "back, arms, hips; hands" (11/30/2015)  . Benign hypertensive heart and kidney disease with diastolic CHF, NYHA class II and CKD stage III ([place])   . Blood transfusion 1972   "after daughter born, attempted to give me blood; couldn't give it cause my blood was cold" (09/23/2013)  . CAD (coronary artery disease)    a. LHC 08/23/11: dLM 40-50%, oRI 40%, oCFX 40%, oD1 70% (small and not amenable to PCI).  LM lesion did not appear to be flow limiting.  Medical rx was recommended;  b. Echo 08/23/11: mild LVH, EF 60-65%, grade 1 diast dysfxn, mild BAE, PASP 24;  c. 02/2012  Cath: LM 50d, LAD 50p, D1 70ost, RI 70p, RCA ok->Med Rx;  d. 01/2013 Cardiolite: EF 88, no ischemia/infarct.  . Carotid stenosis    a. dopplers 10/12:  0-39% bilat ICA;  b. 10/2012 U/S: 0-39% bilat, f/u 1 yr (10/2013).  . Chronic bronchitis   . Depression   . Diverticulosis of colon (without mention of hemorrhage)   . GERD (gastroesophageal reflux disease)   . Heart murmur, systolic    2-D echo in December 2011 showed a normal EF with grade 1 diastolic dysfunction, trivial pulmonary regurgitation and mildly  elevated PA pressure at 37 mmHg probably secondary to her COPD.dynamic obstruction-mid cavity obliteration;  b. 02/2012 Echo: EF 60-65%, mild LVH, PASP 98mmHg.  Marland Kitchen History of stomach ulcers 1970's  . Hyperlipidemia   . Hypertension   . Hypothyroidism   . Migraines    a. Next atypical symptoms in the past. Patient was started on Neurontin for possible neuropathic origin of her pain.  . Polymyalgia rheumatica ([place])   . Shortness of breath on exertion    "just related to angina >1 yr ago" (09/23/2013)  . Sinus arrhythmia   . Type II diabetes mellitus ([place])    a. On oral  hypoglycemic agents.    Past Surgical History:  Procedure Laterality Date  . CARDIAC CATHETERIZATION N/A 09/26/2015   Procedure: Right/Left Heart Cath and Coronary Angiography;  Surgeon: Larey Dresser, MD;  Location: [facility];  Service: Cardiovascular;  Laterality: N/A;  . CATARACT EXTRACTION W/ INTRAOCULAR LENS  IMPLANT, BILATERAL Bilateral 1990's  . LEFT HEART CATHETERIZATION WITH CORONARY ANGIOGRAM N/A 03/16/2012   Procedure: LEFT HEART CATHETERIZATION WITH CORONARY ANGIOGRAM;  Surgeon: Hillary Bow, MD;  Location: [facility];  Service: Cardiovascular;  Laterality: N/A;  . RIGHT/LEFT HEART CATH AND CORONARY ANGIOGRAPHY N/A 08/12/2017   Procedure: RIGHT/LEFT HEART CATH AND CORONARY ANGIOGRAPHY;  Surgeon: Martinique, Peter M, MD;  Location: [facility];  Service: Cardiovascular;  Laterality: N/A;  . VAGINAL HYSTERECTOMY  1976     Current Outpatient Medications  Medication Sig Dispense Refill  . acetaminophen (TYLENOL) 500 MG tablet Take 1,000 mg by mouth every 6 (six) hours as needed for headache. For pain    . ADVAIR DISKUS 250-50 MCG/DOSE AEPB inhale 1 dose by mouth every 12 hours 60 each 11  . amLODipine (NORVASC) 10 MG tablet take 1 tablet by mouth once daily 90 tablet 3  . Ascorbic Acid (VITAMIN C PO) Take 1 tablet by mouth daily.     Marland Kitchen atorvastatin (LIPITOR) 40 MG tablet Take 0.5 tablets (20 mg total) by mouth every evening. 90 tablet 1  . benzocaine-menthol (SORE THROAT LOZENGES) 6-10 MG lozenge Take 1 lozenge by mouth as needed for sore throat. 100 tablet 0  . clopidogrel (PLAVIX) 75 MG tablet take 1 tablet by mouth once daily EVERY EVENING 30 tablet 5  . esomeprazole (NEXIUM) 20 MG capsule Take 1 capsule (20 mg total) by mouth daily. Take 20mg  everyday to every other day as directed. (Patient taking differently: Take 20 mg by mouth every other day. ) 90 capsule 3  . fluticasone (FLONASE) 50 MCG/ACT nasal spray Place 1 spray into both nostrils daily. 16 g 2  .  glimepiride (AMARYL) 1 MG tablet take 1 tablet by mouth once daily BEFORE BREAKFAST 90 tablet 3  . HYDROcodone-acetaminophen (NORCO/VICODIN) 5-325 MG tablet Take 1 tablet by mouth every 6 (six) hours as needed for moderate pain. 20 tablet 0  . hydroxypropyl methylcellulose (ISOPTO TEARS) 2.5 % ophthalmic solution Place 1 drop into both eyes at bedtime.     Marland Kitchen imipramine (TOFRANIL) 50 MG tablet take 1 tablet by mouth twice a day (Patient taking differently: take 50mg  by mouth twice a day) 180 tablet 3  . levothyroxine (SYNTHROID, LEVOTHROID) 25 MCG tablet take 1 tablet by mouth once daily 90 tablet 1  . mirtazapine (REMERON) 7.5 MG tablet Take 1 tablet (7.5 mg total) by mouth at bedtime. 30 tablet 2  . Multiple Vitamin (MULTIVITAMIN WITH MINERALS) TABS tablet Take 1 tablet by mouth daily. [place]  tablet 2  . NITROSTAT 0.4 MG SL tablet place 1 tablet under the tongue if needed every 5 minutes for chest pain for 3 doses IF NO RELIEF AFTER 3RD DOSE CALL PRESCRIBER OR 911. 25 tablet 3  . polycarbophil (FIBERCON) 625 MG tablet Take 1 tablet (625 mg total) by mouth daily. 30 tablet 2  . promethazine (PHENERGAN) 12.5 MG tablet take 1 tablet by mouth every 6 hours if needed for nausea and vomiting 30 tablet 6  . senna (SENOKOT) 8.6 MG TABS tablet Take 1 tablet (8.6 mg total) by mouth at bedtime as needed for mild constipation. 120 each 1  . valsartan (DIOVAN) 80 MG tablet Take 1 tablet (80 mg total) by mouth daily. (Patient taking differently: Take 80 mg by mouth at bedtime. ) 90 tablet 3   No current facility-administered medications for this visit.     Allergies:   Aspirin; Atarax [hydroxyzine]; Nsaids; Codeine; Haldol [haloperidol]; Other; and Penicillins    Social History:  The patient  reports that  has never smoked. she has never used smokeless tobacco. She reports that she does not drink alcohol or use drugs.   Family History:  The patient's family history includes COPD in her father; Colon cancer in  her maternal grandmother; Heart attack in her maternal grandmother; Other in her mother; Pulmonary Hypertension in her daughter.    ROS:  Please see the history of present illness.   Otherwise, review of systems are positive for none.   All other systems are reviewed and negative.    PHYSICAL EXAM: VS:  BP (!) 138/50   Pulse 72   Ht 5\' 4"  (1.626 m)   Wt 139 lb (63 kg)   LMP 02/11/1975   BMI 23.86 kg/m  , BMI Body mass index is 23.86 kg/m. GENERAL:  Well appearing HEENT: Pupils equal round and reactive, fundi not visualized, oral mucosa unremarkable NECK:  No jugular venous distention, waveform within normal limits, carotid upstroke brisk and symmetric, no bruits, no thyromegaly LYMPHATICS:  No cervical adenopathy LUNGS:  Clear to auscultation bilaterally HEART:  RRR.  PMI not displaced or sustained,S1 and S2 within normal limits, no S3, no S4, no clicks, no rubs, II/VI systolic murmur at the LUSB ABD:  Flat, positive bowel sounds normal in frequency in pitch, no bruits, no rebound, no guarding, no midline pulsatile mass, no hepatomegaly, no splenomegaly EXT:  2 plus pulses throughout, no edema, no cyanosis no clubbing SKIN:  No rashes no nodules NEURO:  Cranial nerves II through XII grossly intact, motor grossly intact throughout PSYCH:  Cognitively intact, oriented to person place and time   EKG:  EKG is ordered today. 10/22/17: Sinus rhythm.  Rate 72 bpm.  Prior septal infarct.  LAD.   48 hour Holter 04/12/16: Occasional PACs and PVCs, no worrisome arrhythmias.  CPX 09/26/17: Exercise testing with gas exchange demonstrates moderately reduced functional impairment when compared to matched sedentary norms. There is mild indication for circulatory limitation given elevated VE/VCO2 slope. There was significant chronotropic incompetence. Despite patient's symptoms, results have mildly increased and improved with similar limitations as CPX in 2016.  Heart rate increased from 61 bpm at  baseline to 87 bpm at peak stress.  RHC/LHC 08/12/17:  Ost LM lesion, 40 %stenosed.  LM lesion, 40 %stenosed.  Ost 1st Diag to 1st Diag lesion, 70 %stenosed.  Prox RCA to Mid RCA lesion, 10 %stenosed.  The left ventricular systolic function is normal.  LV end diastolic pressure is normal.  The  left ventricular ejection fraction is 55-65% by visual estimate.  LV end diastolic pressure is normal.   1. Nonobstructive CAD. Unchanged from 2013 and 2016.  2. Normal LV function 3. Normal LV filling pressures 4. Normal right heart pressures  Echo 09/19/15:  Study Conclusions  - Left ventricle: The cavity size was normal. Wall thickness was   increased in a pattern of moderate LVH. Systolic function was   normal. The estimated ejection fraction was in the range of 60%   to 65%. Wall motion was normal; there were no regional wall   motion abnormalities. Left ventricular diastolic function   parameters were normal. - Atrial septum: No defect or patent foramen ovale was identified.   Recent Labs: 12/25/2016: Magnesium 1.8 06/13/2017: ALT 15 08/11/2017: BUN 16; Creatinine, Ser 1.11; Hemoglobin 12.2; Platelets 346; Potassium 4.3; Sodium 141 08/20/2017: TSH 2.792    Lipid Panel    Component Value Date/Time   CHOL 146 06/13/2017 0804   TRIG 268 (H) 06/13/2017 0804   HDL 46 06/13/2017 0804   CHOLHDL 3.2 06/13/2017 0804   CHOLHDL 4 08/05/2014 0933   VLDL 33.8 08/05/2014 0933   LDLCALC 46 06/13/2017 0804      Wt Readings from Last 3 Encounters:  10/22/17 139 lb (63 kg)  09/29/17 138 lb (62.6 kg)  09/23/17 138 lb (62.6 kg)      ASSESSMENT AND PLAN:  # Exertional dyspnea:  # Chronotropic incompetence:  Heart rate only increased to 87 with exercise, which is 63% max predicted.  We will stop metoprolol.  If her symptoms do not improve, consider EP referral.  We will also get an echo.  # Medically managed CAD:  Ms. Pho has known left main and diagonal lesions that are  medically managed. She has occasional mild episodes of chest pain that are not exertional. It is unclear that these are anginal. Continue amlodipine, atorvastatin, clopidogrel, and stop metoprolol.  # Hypertension: Blood pressure slightly above goal. However she has a history of orthostasis. Therefore we'll not make any changes. Continue amlodipine and valsartan.  Stop metoprolol as above.   # Hyperlipidemia: LDL 46 on 05/2017.  Triglycerides are elevated.  Continue atorvastatin.     Current medicines are reviewed at length with the patient today.  The patient does not have concerns regarding medicines.  The following changes have been made:  no change  Labs/ tests ordered today include:   Orders Placed This Encounter  Procedures  . EKG 12-Lead  . ECHOCARDIOGRAM COMPLETE    Disposition:   FU with Loletha Bertini C. Oval Linsey, MD, [name] in 1 month.    This note was written with the assistance of speech recognition software.  Please excuse any transcriptional errors.  Signed, Jancarlo Biermann C. Oval Linsey, MD, [facility]  10/22/2017 1:03 PM    [place]

## 2017-10-22 NOTE — Patient Instructions (Addendum)
Your cardiopulmonary stress test showed that your heart rate did not increase appropriately with exercise.  It is very possible that this is causing your shortness of breath with exertion.  We will stop your metoprolol, which is a heart rate lowering medication.  If your symptoms do not improve with this change, then we will refer you to the heart rhythm specialist.  Medication Instructions:  STOP METOPROLOL   Labwork: NONE  Testing/Procedures: Your physician has requested that you have an echocardiogram. Echocardiography is a painless test that uses sound waves to create images of your heart. It provides your doctor with information about the size and shape of your heart and how well your heart's chambers and valves are working. This procedure takes approximately one hour. There are no restrictions for this procedure. [place]  STE 300   Follow-Up: Your physician recommends that you schedule a follow-up appointment in: [place]   If you need a refill on your cardiac medications before your next appointment, please call your pharmacy.

## 2017-10-23 ENCOUNTER — Encounter: Payer: Self-pay | Admitting: *Deleted

## 2017-10-27 ENCOUNTER — Other Ambulatory Visit (HOSPITAL_COMMUNITY): Payer: Self-pay

## 2017-10-29 IMAGING — MR MR HEAD W/O CM
9 of 10 series · 36 of 48 positions shown · non-contrast
Comparison: MRI head November 30, 2016 and CT HEAD December 05, 2016

CLINICAL DATA: Suspected Parkinson's, rigid posture with worsening
bradykinesis. History of hypertension, polymyalgia rheumatica,
diabetes.

EXAM:
MRI HEAD WITHOUT CONTRAST
TECHNIQUE: Multiplanar, multiecho pulse sequences of the brain and surrounding
structures were obtained without intravenous contrast. Due to
patient motion, contrast not administered.

[Series 3: DWI · axial · 3.0mm · 0.94mm/px · z∈[-40,+105]mm · 11 of 100 slices shown (1 of 2)]
[im 1/100]
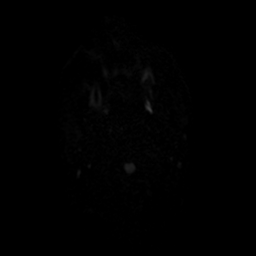
[im 10/100]
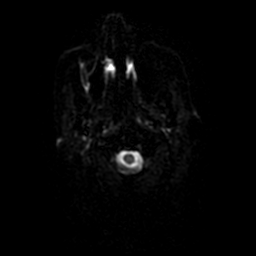
[im 20/100]
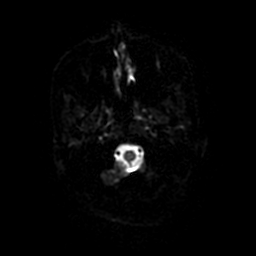
[im 30/100]
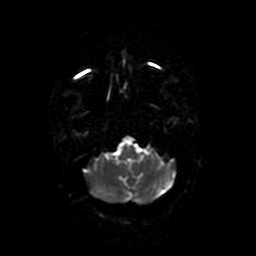
[im 40/100]
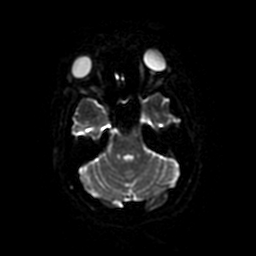
[im 50/100]
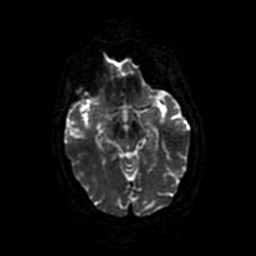
[im 60/100]
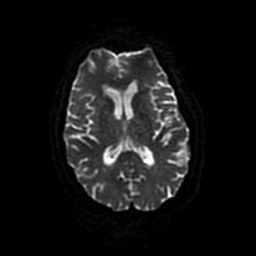
[im 70/100]
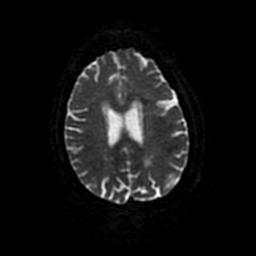
[im 80/100]
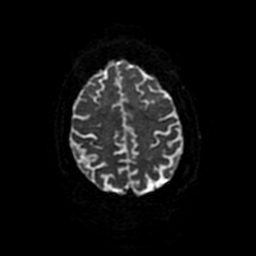
[im 90/100]
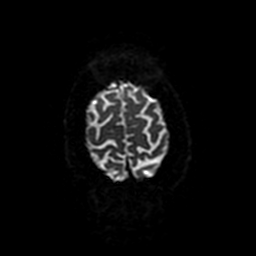
[im 100/100]
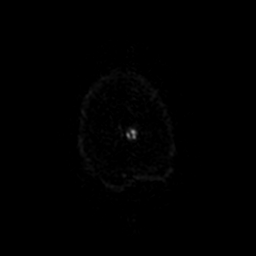

[Series 4: DWI · coronal · 4.0mm · 0.94mm/px · 6 of 62 slices shown (2 of 2)]
[im 1/62]
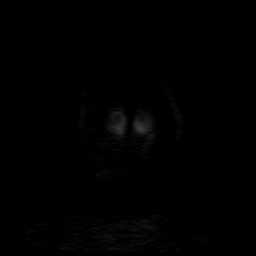
[im 13/62]
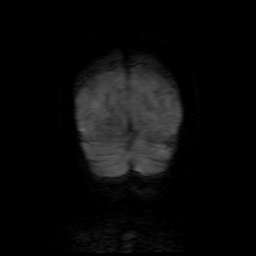
[im 25/62]
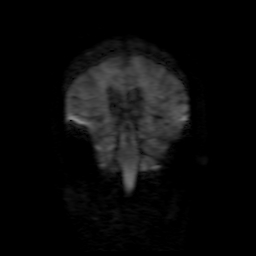
[im 37/62]
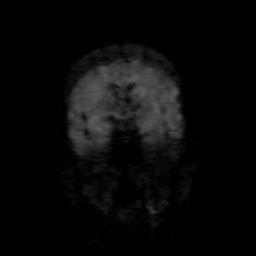
[im 49/62]
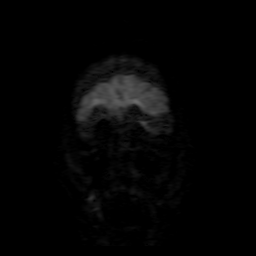
[im 62/62]
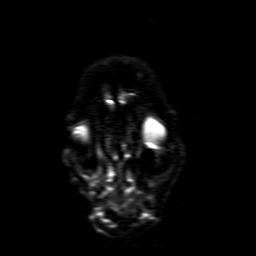

[Series 6: T2 · axial · 5.0mm · 0.47mm/px · z∈[-39,+104]mm · 2 of 25 slices shown (1 of 2)]
[im 1/25]
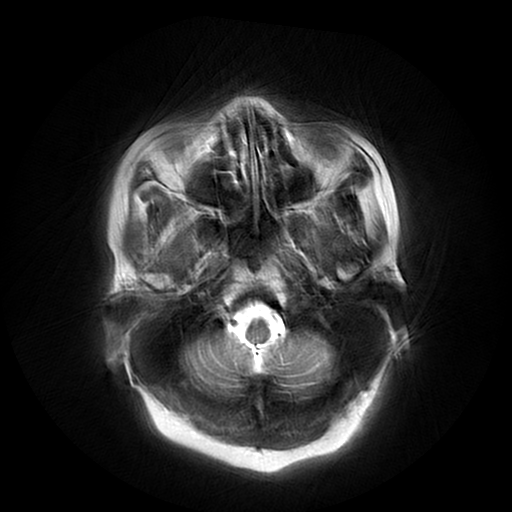
[im 25/25]
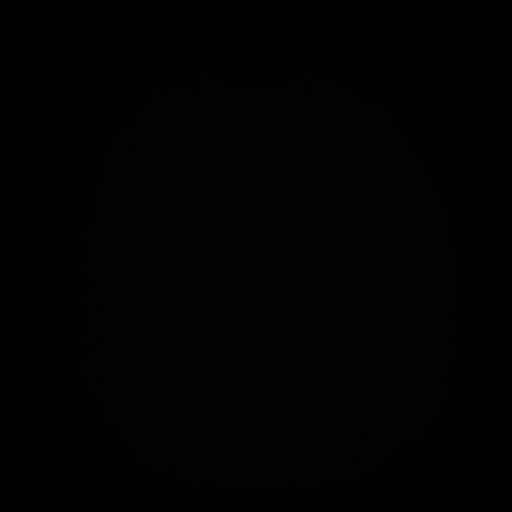

[Series 7: FLAIR · axial · 5.0mm · 0.47mm/px · z∈[-39,+104]mm · 2 of 25 slices shown (1 of 2)]
[im 1/25]
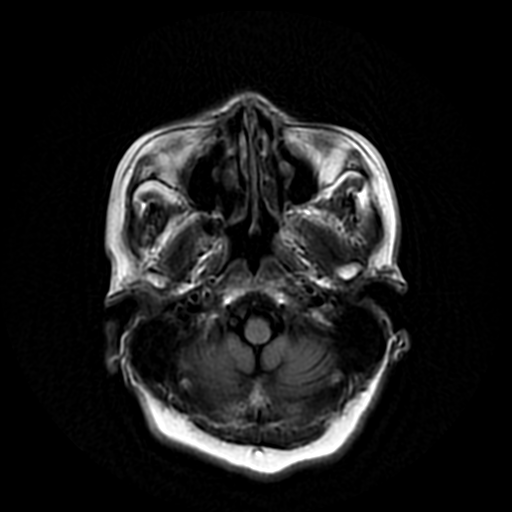
[im 25/25]
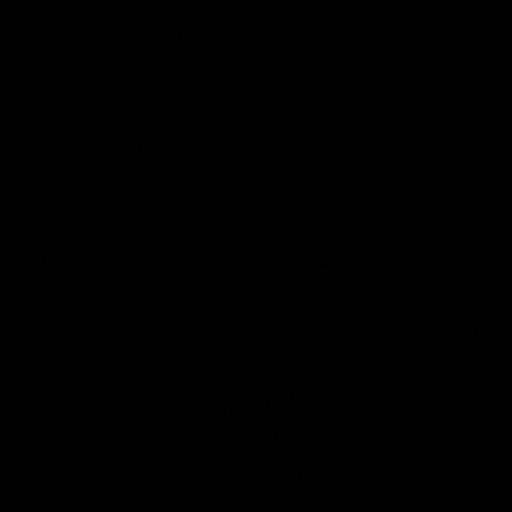

[Series 8: FLAIR · sagittal · 5.0mm · 0.47mm/px · 2 of 21 slices shown (2 of 2)]
[im 1/21]
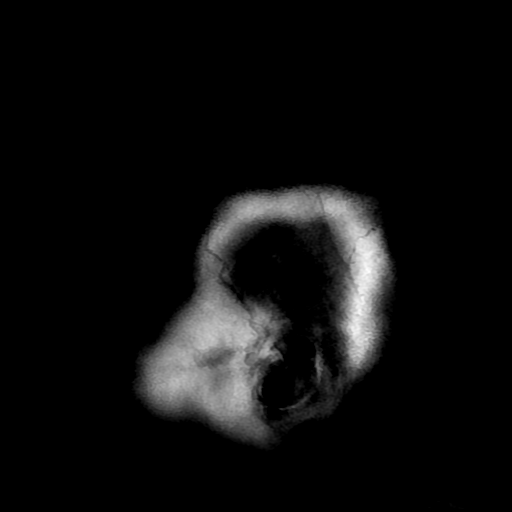
[im 21/21]
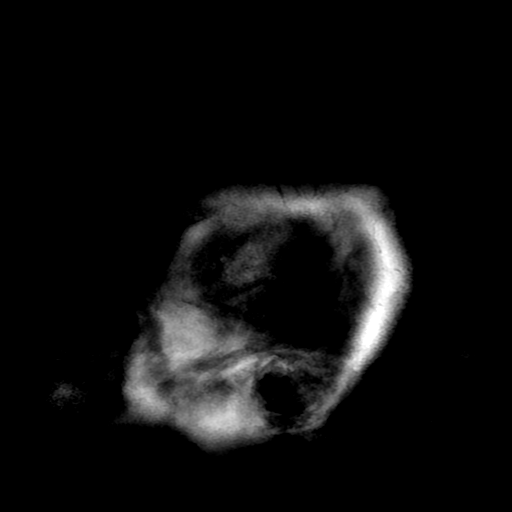

[Series 9: (person_name) · axial · 3.0mm · 0.47mm/px · z∈[-47,+2]mm · 3 of 100 slices shown]
[im 1/100]
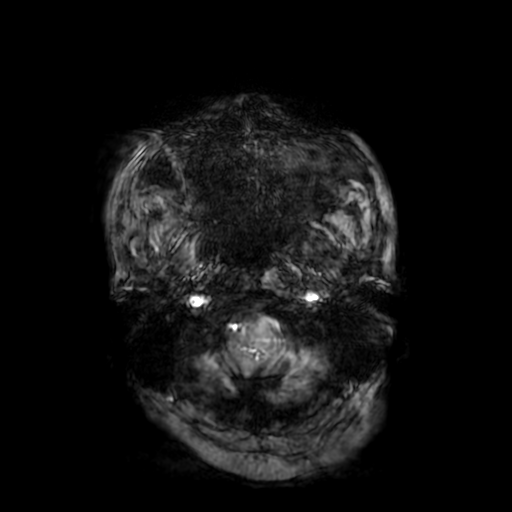
[im 12/100]
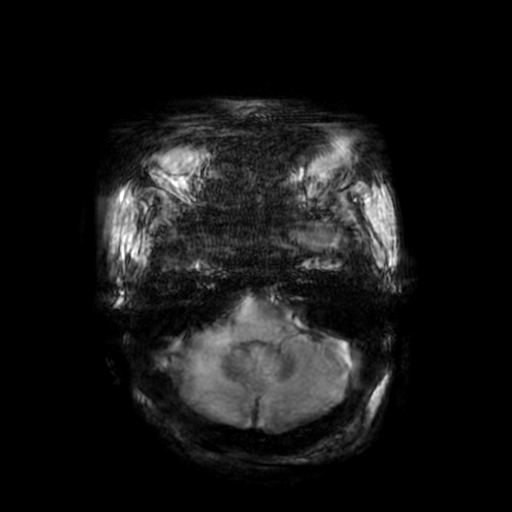
[im 34/100]
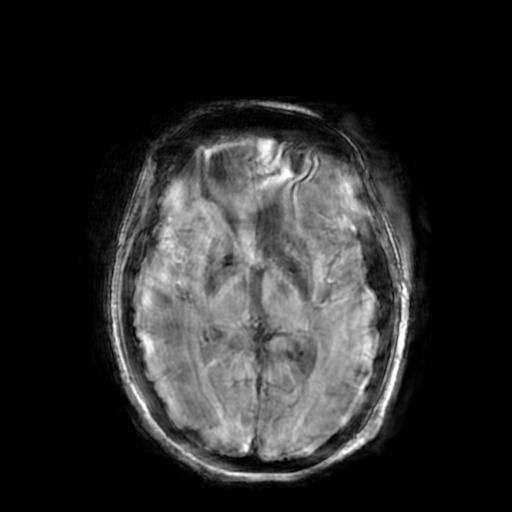

[Series 11: T2 · coronal · 5.0mm · 0.47mm/px · 2 of 22 slices shown (2 of 2)]
[im 1/22]
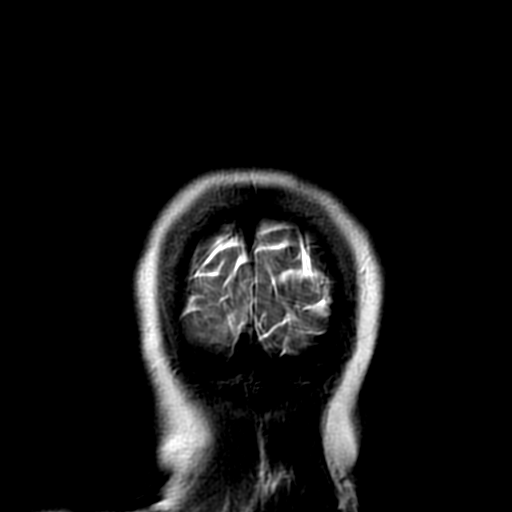
[im 22/22]
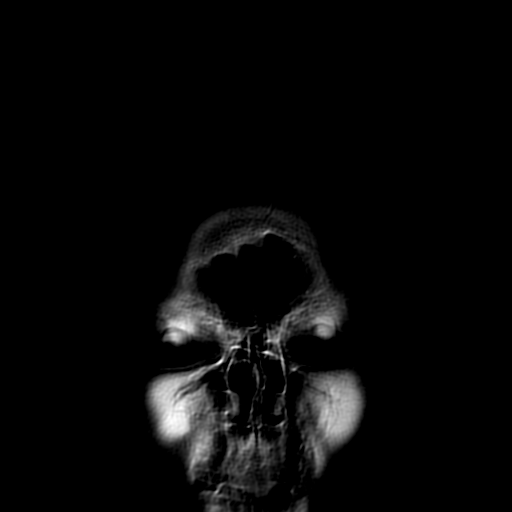

[Series 350: ADC · axial · 3.0mm · 0.94mm/px · z∈[-40,+105]mm · 5 of 49 slices shown (1 of 2)]
[im 1/49]
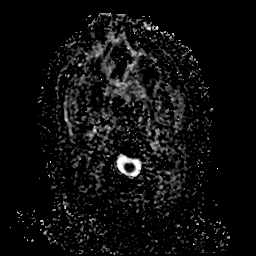
[im 13/49]
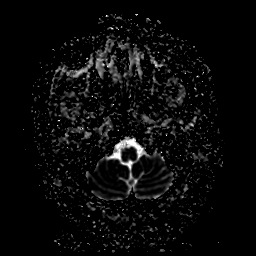
[im 25/49]
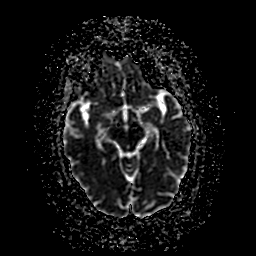
[im 37/49]
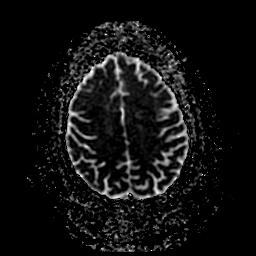
[im 49/49]
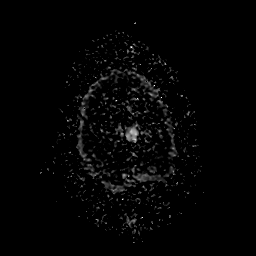

[Series 450: ADC · coronal · 4.0mm · 0.94mm/px · 3 of 31 slices shown (2 of 2)]
[im 1/31]
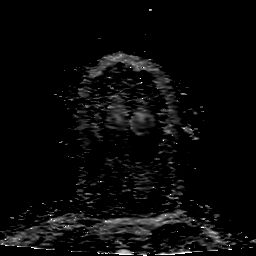
[im 16/31]
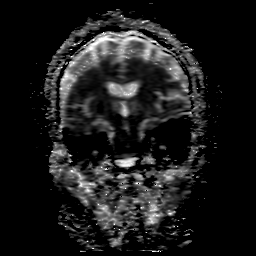
[im 31/31]
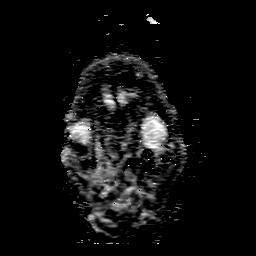

[36 of 48 positions shown; findings below may reference images not displayed]

FINDINGS: Moderate to severely motion degraded examination. Fast sequences E
performed.

BRAIN: No reduced diffusion to suggest acute ischemia. No
susceptibility artifact to suggest hemorrhage. The ventricles and
sulci are normal for patient's age. Patchy supratentorial white
matter FLAIR T2 hyperintensities compatible with mild chronic small
vessel ischemic disease, less than expected for age. No suspicious
parenchymal signal, masses or mass effect. No abnormal extra-axial
fluid collections. No extra-axial masses though, contrast enhanced
sequences would be more sensitive.

VASCULAR: Normal major intracranial vascular flow voids present at
skull base.

SKULL AND UPPER CERVICAL SPINE: No abnormal sellar expansion. No
suspicious calvarial bone marrow signal. Craniocervical junction
maintained.

SINUSES/ORBITS: Trace RIGHT posterior ethmoid mucosal thickening.
Mastoid air cells are well aerated. The included ocular globes and
orbital contents are non-suspicious, status post bilateral ocular
lens implants.

OTHER: None.
IMPRESSION: Moderate to severely motion degraded examination: Stable negative
noncontrast MRI head for age.

## 2017-10-30 ENCOUNTER — Other Ambulatory Visit: Payer: Self-pay

## 2017-10-30 ENCOUNTER — Encounter: Payer: Self-pay | Admitting: Internal Medicine

## 2017-10-30 ENCOUNTER — Ambulatory Visit (HOSPITAL_COMMUNITY)
Admission: RE | Admit: 2017-10-30 | Discharge: 2017-10-30 | Disposition: A | Discharge status: Home / Self Care | Payer: Medicare Other | Source: Ambulatory Visit | Attending: Student in an Organized Health Care Education/Training Program | Admitting: Student in an Organized Health Care Education/Training Program

## 2017-10-30 ENCOUNTER — Ambulatory Visit (INDEPENDENT_AMBULATORY_CARE_PROVIDER_SITE_OTHER): Payer: Medicare Other | Admitting: Internal Medicine

## 2017-10-30 VITALS — BP 119/57 | HR 99 | Temp 99.7°F | Ht 64.0 in | Wt 137.2 lb

## 2017-10-30 DIAGNOSIS — I2510 Atherosclerotic heart disease of native coronary artery without angina pectoris: Secondary | ICD-10-CM | POA: Diagnosis not present

## 2017-10-30 DIAGNOSIS — I10 Essential (primary) hypertension: Secondary | ICD-10-CM

## 2017-10-30 DIAGNOSIS — E785 Hyperlipidemia, unspecified: Secondary | ICD-10-CM | POA: Diagnosis not present

## 2017-10-30 DIAGNOSIS — E119 Type 2 diabetes mellitus without complications: Secondary | ICD-10-CM | POA: Diagnosis not present

## 2017-10-30 DIAGNOSIS — J019 Acute sinusitis, unspecified: Secondary | ICD-10-CM

## 2017-10-30 DIAGNOSIS — Z7902 Long term (current) use of antithrombotics/antiplatelets: Secondary | ICD-10-CM

## 2017-10-30 DIAGNOSIS — R05 Cough: Secondary | ICD-10-CM | POA: Diagnosis not present

## 2017-10-30 DIAGNOSIS — R053 Chronic cough: Secondary | ICD-10-CM | POA: Insufficient documentation

## 2017-10-30 DIAGNOSIS — J069 Acute upper respiratory infection, unspecified: Secondary | ICD-10-CM

## 2017-10-30 DIAGNOSIS — K219 Gastro-esophageal reflux disease without esophagitis: Secondary | ICD-10-CM

## 2017-10-30 DIAGNOSIS — J449 Chronic obstructive pulmonary disease, unspecified: Secondary | ICD-10-CM | POA: Diagnosis not present

## 2017-10-30 MED ORDER — AMOXICILLIN-POT CLAVULANATE 875-125 MG PO TABS: 1.0000 | ORAL_TABLET | Freq: Two times a day (BID) | ORAL | 0 refills | Status: AC

## 2017-10-30 NOTE — Assessment & Plan Note (Addendum)
Patient's description of symptoms is consistent with upper respiratory tract infection. Her endorsement of frontal headache, worsening congestion and cough are consistent with sinusitis and given that her symptoms have persisted for 8 days, she may benefit from antibiotic therapy. Her symptoms do not sound consistent with pneumonia, as the patient does not endorse worsening of chronic SOB and no signs of increasing oxygen requirement on PE. Given hx of COPD and duration of symptoms, however, will obtain 2 view chest x ray to rule out pneumonia.  Plan to prescribe 5 days of antibiotic therapy and have patient return to clinic in 1-2 weeks for follow up if her symptoms should they not improve on antibiotic regimen outlined below.   Plan: -Augmentin BID for 5 days -Chest X ray 2 view to evaluate for pneumonia -Continue supportive care with OTC medications and nasal irrigation, as discussed during visit -RTC in symptoms do not improve in 1-2 weeks with antibiotic therapy for reevaluation

## 2017-10-30 NOTE — Progress Notes (Signed)
CC: Cough, congestion, and headache x8 days  HPI:  [name] is a 83 y.o. with PMH of CAD, GERD, HTN, HLD and T2DM who presents for evaluation of cough, congestion, and headache for the past 8 days. Patient states that over 1 week ago she first noticed this constellation of symptoms. At the time her daughter and grandson were also sick with cough and congestion. Patient states that her symptoms lasted for a few days and were initially improving, but then all of the sudden became worse at the beginning of this week. Over the past 3 days she has also developed a headache that is described as "throbbing" and localized to her forehead. Her symptoms have gradually worsened to the point that her cough is making her chest hurt and is intolerable. Her cough is productive of a white sputum, which is also the same color as her nasal discharge. She endorses fatigue and general myalgias. Denies fevers, chills, nausea, vomiting, and diarrhea. Patient has been taking OTC medications, including Tylenol, cough syrup, and flonase without relief of her symptoms.   Past Medical History: Past Medical History:  Diagnosis Date  . Anemia   . Anginal pain ([place])   . Arthritis    "back, arms, hips; hands" (11/30/2015)  . Benign hypertensive heart and kidney disease with diastolic CHF, NYHA class II and CKD stage III ([place])   . Blood transfusion 1972   "after daughter born, attempted to give me blood; couldn't give it cause my blood was cold" (09/23/2013)  . CAD (coronary artery disease)    a. LHC 08/23/11: dLM 40-50%, oRI 40%, oCFX 40%, oD1 70% (small and not amenable to PCI).  LM lesion did not appear to be flow limiting.  Medical rx was recommended;  b. Echo 08/23/11: mild LVH, EF 60-65%, grade 1 diast dysfxn, mild BAE, PASP 24;  c. 02/2012  Cath: LM 50d, LAD 50p, D1 70ost, RI 70p, RCA ok->Med Rx;  d. 01/2013 Cardiolite: EF 88, no ischemia/infarct.  . Carotid stenosis    a. dopplers 10/12:  0-39% bilat ICA;  b.  10/2012 U/S: 0-39% bilat, f/u 1 yr (10/2013).  . Chronic bronchitis   . Depression   . Diverticulosis of colon (without mention of hemorrhage)   . GERD (gastroesophageal reflux disease)   . Heart murmur, systolic    2-D echo in December 2011 showed a normal EF with grade 1 diastolic dysfunction, trivial pulmonary regurgitation and mildly elevated PA pressure at 37 mmHg probably secondary to her COPD.dynamic obstruction-mid cavity obliteration;  b. 02/2012 Echo: EF 60-65%, mild LVH, PASP 68mmHg.  Marland Kitchen History of stomach ulcers 1970's  . Hyperlipidemia   . Hypertension   . Hypothyroidism   . Migraines    a. Next atypical symptoms in the past. Patient was started on Neurontin for possible neuropathic origin of her pain.  . Polymyalgia rheumatica ([place])   . Shortness of breath on exertion    "just related to angina >1 yr ago" (09/23/2013)  . Sinus arrhythmia   . Type II diabetes mellitus ([place])    a. On oral hypoglycemic agents.   Review of Systems:   Patient endorses productive cough with white sputum, congestion, and frontal headache, as per HPI Patient denies chest pain, worsening shortness of breath, abdominal pain, diaphoresis, nausea/vomiting, and lower extremity swelling.  Physical Exam:  Vitals:   10/30/17 1103  BP: (!) 119/57  Pulse: 99  Temp: 99.7 F (37.6 C)  TempSrc: Oral  SpO2: 100%  Weight: 137  lb 3.2 oz (62.2 kg)  Height: 5\' 4"  (1.626 m)   Physical Exam  Constitutional: She appears well-developed and well-nourished. No distress.  HENT:  Mouth/Throat: Oropharynx is clear and moist. No oropharyngeal exudate.  Eyes: Conjunctivae are normal. Right eye exhibits no discharge. Left eye exhibits no discharge.  Cardiovascular: Normal rate, regular rhythm and intact distal pulses. Exam reveals no friction rub.  No murmur heard. Respiratory:  Patient intermittently coughing during exam. No accessory muscle use or nasal flaring. No crackles or wheezing appreciated in all lung  fields.   Musculoskeletal: She exhibits no edema (of bilateral lower extremities) or tenderness (of bilatateral lower extremities).  Lymphadenopathy:    She has no cervical adenopathy.  Skin: Skin is warm and dry. No rash noted. She is not diaphoretic. No erythema.   Assessment & Plan:   See Encounters Tab for problem based charting.  Patient seen with Dr. Evette Doffing.

## 2017-10-30 NOTE — Assessment & Plan Note (Deleted)
Patient's description of symptoms initially improving but then gradually getting worse is consistent with post-viral bacterial infection. Her endorsement of frontal headache, worsening congestion and cough are consistent with bacterial sinusitis. Her cough sounds more like post-nasal drip rather than pneumonia. Consistent with this, the patient does not endorse worsening of chronic SOB and no signs of increasing oxygen requirement on PE. Plan to prescribed antibiotic therapy and have patient return to clinic in 1 week for follow up if her symptoms do not improve on antibiotic regimen outlined below.   Plan: -Augmentin BID for 5 days -RTC in symptoms do not improve in 1 week with antibiotic therapy for reevaluation

## 2017-10-30 NOTE — Patient Instructions (Addendum)
Thank you for seeing [place] in the clinic today!  You were evaluated for cough, congestion and headache. Your symptoms are consistent with an upper respiratory tract infection. You were prescribed a course of antibiotics that you will have to take twice a day for the next 5 days. You should begin to feel better within 1 week on this medication regimen. Please continue taking OTC medications to help with your symptoms. Please consider using the instructions below to perform nasal irrigation. If you are able to do this daily, it will help with your symptoms of congestion. We ordered a chest x ray to examine for pneumonia. I will call you with the results of this study when they become available.  Please return to the clinic in 1-2 weeks for reevaluation of your symptoms if they do not improve after antibiotic therapy.  If you have any questions or concerns, please call our clinic at [phone] between the hours of 9am-5pm. If you have a problem after these hours, please call [phone] and ask for the internal medicine resident on call. If you feel you are having a medical emergency please call 911.   Thanks, Dr. Larena Glassman Naiara Lombardozzi  INSTRUCTIONS FOR AT HOME NASAL IRRIGATION WITH SALINE SOLUTION The Benefits: 1. When you irrigate, the isotonic saline (salt water) acts as a solvent and washes the mucus crusts and other debris from your nose.  2. This decongests and improves the airflow into your nose. The sinus passages begin to open.  3. Studies have also shown that a salt water and an alkaline (baking soda) irrigation solution improves nasal membrane cell function (mucociliary flow of mucus debris).  The Recipe: 1. Choose a 1-quart glass jar that is thoroughly cleansed.  2. Fill with sterile or distilled water, or you can boil water from the tap.  3. Add 1 to 2 heaping teaspoons of "pickling/canning/sea" salt (NOT table salt as it contains a large number of additives). This salt is available at  the grocery store in the food canning section.  4. Add 1 teaspoon of [employer] Baking Soda (pure bicarbonate).  5. Mix ingredients together and store at room temperature. Discard after one week. If you find this solution too strong, you may decrease the amount of salt added to 1 to 1  teaspoons. With children it is often best to start with a milder solution and advance slowly. Irrigate with 240 ml (8 oz) twice daily.  The Instructions: You should plan to irrigate your nose with buffered isotonic saline 2 times per day. Many people prefer to warm the solution slightly in the microwave - but be sure that the solution is NOT HOT. Stand over the sink (some do this in the shower) and squirt the solution into each side of your nose, keeping your mouth open. This allows you to spit the saltwater out of your mouth. It will not harm you if you swallow a little.  If you have been told to use a nasal steroid such as Flonase, Nasonex, or Nasacort, you should always use isortonic saline solution first, then use your nasal steroid product. The nasal steroid is much more effective when sprayed onto clean nasal membranes and the steroid medicine will reach deeper into the nose.  Most people experience a little burning sensation the first few times they use a isotonic saline solution, but this usually goes away within a few days.

## 2017-10-31 NOTE — Progress Notes (Signed)
Internal Medicine Clinic Attending  I saw and evaluated the patient.  I personally confirmed the key portions of the history and exam documented by Dr. Nedrud and I reviewed pertinent patient test results.  The assessment, diagnosis, and plan were formulated together and I agree with the documentation in the resident's note.  

## 2017-11-05 ENCOUNTER — Telehealth: Payer: Self-pay | Admitting: *Deleted

## 2017-11-05 NOTE — Telephone Encounter (Signed)
Pt's daughter calls and states pt is no better and cough is much worse, neither pt nor daughter are able to sleep at night due to cough pt also cannot speak more that a few words before going into a "coughing fit" F/u appt [facility] 12/20 at [facility]

## 2017-11-06 ENCOUNTER — Encounter: Payer: Self-pay | Admitting: Internal Medicine

## 2017-11-06 ENCOUNTER — Other Ambulatory Visit: Payer: Self-pay

## 2017-11-06 ENCOUNTER — Ambulatory Visit (INDEPENDENT_AMBULATORY_CARE_PROVIDER_SITE_OTHER): Payer: Medicare Other | Admitting: Internal Medicine

## 2017-11-06 VITALS — BP 159/62 | HR 90 | Temp 97.7°F | Ht 64.0 in | Wt 137.2 lb

## 2017-11-06 DIAGNOSIS — R05 Cough: Secondary | ICD-10-CM | POA: Diagnosis not present

## 2017-11-06 DIAGNOSIS — N183 Chronic kidney disease, stage 3 (moderate): Secondary | ICD-10-CM | POA: Diagnosis not present

## 2017-11-06 DIAGNOSIS — Z7902 Long term (current) use of antithrombotics/antiplatelets: Secondary | ICD-10-CM

## 2017-11-06 DIAGNOSIS — I5030 Unspecified diastolic (congestive) heart failure: Secondary | ICD-10-CM

## 2017-11-06 DIAGNOSIS — B370 Candidal stomatitis: Secondary | ICD-10-CM

## 2017-11-06 DIAGNOSIS — I2510 Atherosclerotic heart disease of native coronary artery without angina pectoris: Secondary | ICD-10-CM

## 2017-11-06 DIAGNOSIS — R093 Abnormal sputum: Secondary | ICD-10-CM | POA: Diagnosis not present

## 2017-11-06 DIAGNOSIS — R053 Chronic cough: Secondary | ICD-10-CM

## 2017-11-06 DIAGNOSIS — K219 Gastro-esophageal reflux disease without esophagitis: Secondary | ICD-10-CM

## 2017-11-06 MED ORDER — GUAIFENESIN-CODEINE 100-10 MG/5ML PO SYRP: 5.0000 mL | ORAL_SOLUTION | Freq: Three times a day (TID) | ORAL | 0 refills | Status: DC | PRN

## 2017-11-06 MED ORDER — ALBUTEROL SULFATE (2.5 MG/3ML) 0.083% IN NEBU
2.5000 mg | INHALATION_SOLUTION | Freq: Once | RESPIRATORY_TRACT | Status: AC
  Administered 2017-11-06: 2.5 mg via RESPIRATORY_TRACT

## 2017-11-06 MED ORDER — ESOMEPRAZOLE MAGNESIUM 20 MG PO CPDR: 20.0000 mg | DELAYED_RELEASE_CAPSULE | Freq: Every day | ORAL | 3 refills | Status: DC

## 2017-11-06 MED ORDER — ALBUTEROL SULFATE HFA 108 (90 BASE) MCG/ACT IN AERS: 1.0000 | INHALATION_SPRAY | Freq: Four times a day (QID) | RESPIRATORY_TRACT | 2 refills | Status: DC | PRN

## 2017-11-06 MED ORDER — ALBUTEROL SULFATE (2.5 MG/3ML) 0.083% IN NEBU: 2.5000 mg | INHALATION_SOLUTION | Freq: Once | RESPIRATORY_TRACT | Status: DC

## 2017-11-06 MED ORDER — PSEUDOEPHEDRINE HCL ER 120 MG PO TB12: 120.0000 mg | ORAL_TABLET | Freq: Two times a day (BID) | ORAL | 0 refills | Status: DC

## 2017-11-06 MED ORDER — ONDANSETRON HCL 4 MG PO TABS: 4.0000 mg | ORAL_TABLET | Freq: Every day | ORAL | 0 refills | Status: DC | PRN

## 2017-11-06 MED ORDER — IRBESARTAN 150 MG PO TABS: 150.0000 mg | ORAL_TABLET | Freq: Every day | ORAL | 0 refills | Status: DC

## 2017-11-06 MED ORDER — FLUCONAZOLE 100 MG PO TABS: ORAL_TABLET | ORAL | 0 refills | Status: DC

## 2017-11-06 NOTE — Patient Instructions (Signed)
Thank you for seeing [place] in the clinic today!  You were evaluated for worsening cough. Please make the following changes:  1) STOP using advair daily, START using albuterol inhaler only if you feel short of breath 2) START fluconazole for the next 7 days to treat the thrush in your mouth 3) START Nexium daily until follow up with PCP 4) STOP taking valsartan, START taking irbesartan 150 mg daily instead 5) START taking cough syrup, guafienesin-codeine, three times a day as needed' 6) START taking Sudafed twice a day  7) Please keep your echo appointment  Please return to the clinic on 11/26/2017 for follow up of your cough with Dr. Daryll Drown  If you have any questions or concerns, please call our clinic at [phone] between the hours of 9am-5pm. If you have a problem after these hours, please call [phone] and ask for the internal medicine resident on call. If you feel you are having a medical emergency please call 911.   Thanks, Dr. Larena Glassman Bracen Schum

## 2017-11-06 NOTE — Progress Notes (Signed)
CC: Cough follow up  HPI:  [name] is a 83 y.o. with PMH of GERD, diastolic HF, CKD stage 3, and CAD who presents for evaluation of chronic cough. The patient was last seen in clinic on 10/30/17 at which time she was though to have an upper respiratory infection with post nasal drip contributing to her chronic cough. She was prescribed augmentin BID for 5 days to help with her symptoms. Her symptoms, however, did not improve with that intervention. Patient states that cough has continued to worsen over time since it first developed many months ago. Cough continues to produce a white sputum. No fevers, congestion, SOB with exertion, headaches and n/v.   Past Medical History: Past Medical History:  Diagnosis Date  . Anemia   . Anginal pain ([place])   . Arthritis    "back, arms, hips; hands" (11/30/2015)  . Benign hypertensive heart and kidney disease with diastolic CHF, NYHA class II and CKD stage III ([place])   . Blood transfusion 1972   "after daughter born, attempted to give me blood; couldn't give it cause my blood was cold" (09/23/2013)  . CAD (coronary artery disease)    a. LHC 08/23/11: dLM 40-50%, oRI 40%, oCFX 40%, oD1 70% (small and not amenable to PCI).  LM lesion did not appear to be flow limiting.  Medical rx was recommended;  b. Echo 08/23/11: mild LVH, EF 60-65%, grade 1 diast dysfxn, mild BAE, PASP 24;  c. 02/2012  Cath: LM 50d, LAD 50p, D1 70ost, RI 70p, RCA ok->Med Rx;  d. 01/2013 Cardiolite: EF 88, no ischemia/infarct.  . Carotid stenosis    a. dopplers 10/12:  0-39% bilat ICA;  b. 10/2012 U/S: 0-39% bilat, f/u 1 yr (10/2013).  . Chronic bronchitis   . Depression   . Diverticulosis of colon (without mention of hemorrhage)   . GERD (gastroesophageal reflux disease)   . Heart murmur, systolic    2-D echo in December 2011 showed a normal EF with grade 1 diastolic dysfunction, trivial pulmonary regurgitation and mildly elevated PA pressure at 37 mmHg probably secondary to her  COPD.dynamic obstruction-mid cavity obliteration;  b. 02/2012 Echo: EF 60-65%, mild LVH, PASP 27mmHg.  Marland Kitchen History of stomach ulcers 1970's  . Hyperlipidemia   . Hypertension   . Hypothyroidism   . Migraines    a. Next atypical symptoms in the past. Patient was started on Neurontin for possible neuropathic origin of her pain.  . Polymyalgia rheumatica ([place])   . Shortness of breath on exertion    "just related to angina >1 yr ago" (09/23/2013)  . Sinus arrhythmia   . Type II diabetes mellitus ([place])    a. On oral hypoglycemic agents.   Review of Systems:   Patient endorses chronic productive cough with white sputum, as per HPI Patient denies chest pain, abdominal pain, diaphoresis, nausea/vomiting, lower extremity swelling, and change in bowel/bladder habits.  Physical Exam:  Vitals:   11/06/17 0947  BP: (!) 159/62  Pulse: 90  Temp: 97.7 F (36.5 C)  TempSrc: Oral  SpO2: 99%  Weight: 137 lb 3.2 oz (62.2 kg)  Height: 5\' 4"  (1.626 m)   Physical Exam  Constitutional:  Thin elderly woman sitting in wheelchair in no acute distress  HENT:  Mouth/Throat: No oropharyngeal exudate.  Oropharynx moist with white residue on tongue.   Eyes: Conjunctivae are normal. Right eye exhibits no discharge. Left eye exhibits no discharge.  Cardiovascular: Normal rate, regular rhythm and intact distal pulses.  No murmur heard. Respiratory:  Patient speaking in full sentences during exam. NO accessory muscle use or nasal flaring. NO signs of cyanosis. No wheezing or crackles heard in all lung fields.   GI: Soft. Bowel sounds are normal. She exhibits no distension.  Musculoskeletal: She exhibits no edema (of bilateral lower extremities) or tenderness (of bilateral lower extremities).  Lymphadenopathy:    She has no cervical adenopathy.  Skin: Skin is warm and dry. No rash noted. No erythema.   Assessment & Plan:   See Encounters Tab for problem based charting.  Patient seen with Dr. Lynnae January

## 2017-11-06 NOTE — Assessment & Plan Note (Addendum)
Patient previously evaluated for this by multiple physicians on multiple occassions. Last seen by writer and prescribed Augmentin BID for possible sinus infection. Patient's cough is continuing to worsen and not well controlled. It more likely at this point that the cough is a result of worsening HF or underlying lung disease. Worsening HF more likely, given patient's description of white sputum, but patient not overtly overloaded on PE. Patient has echocardiography scheduled tomorrow and follow up with cardiologist in 1-2 weeks.  Lung disease less likely as patient recently had PFT's that did not show any evidence of obstructive lung disease. Furthermore recent chest imaging did not show pulmonary edema or infiltrates to suggest pneumonia. Also no flattening of diaphragm to suggest air trapping/COPD. Patient has been using advair inhaler for presumed COPD?. It appears on PE that she has thrush, which may be 2/2 inhaler use. Patient told to discontinue this inhaler and start taking fluconazole x 7 days for treatment of thrush. She will also start using albuterol inhaler PRN for symptoms of wheezing/SOB with exertion. Patient given albuterol inhaler in clinic and she appeared to be coughing less, although no change in pulmonary exam noted.   Since cause of chronic cough unknown at this tim and the symptoms is extremely bothersome to patient and family, will attempt to make medication changes to treat this symptom and provide supportive care. To fully treat GERD, patient instructed to take Nexium daily rather than intermittently. To treat possible post-nasal drip, patient prescribed decongestant until follow up with PCP. Patient also switched from valsartan to irbesartan, a medication with less side effect of chronic cough. Patient will also be given cough syrup with codeine for supportive treatment of her symptoms. Patient expressed understanding of these medication changes.  Plan: -Treatment of oral thrush,  fluconazole x7 days -D/c advair, prescribe albuterol PRN - script given -Nexium 20 mg daily - script given -Guafenisen-codeine PRN -Sudafed, BID until return to clinic - script given -Change valsartan to irbesartan 150 mg daily - script given -RTC with PCP on 11/26/2016

## 2017-11-07 ENCOUNTER — Other Ambulatory Visit: Payer: Self-pay

## 2017-11-07 ENCOUNTER — Ambulatory Visit (HOSPITAL_COMMUNITY): Payer: Medicare Other | Attending: Internal Medicine

## 2017-11-07 DIAGNOSIS — E119 Type 2 diabetes mellitus without complications: Secondary | ICD-10-CM | POA: Diagnosis not present

## 2017-11-07 DIAGNOSIS — E785 Hyperlipidemia, unspecified: Secondary | ICD-10-CM | POA: Diagnosis not present

## 2017-11-07 DIAGNOSIS — R0602 Shortness of breath: Secondary | ICD-10-CM | POA: Diagnosis not present

## 2017-11-07 DIAGNOSIS — I2510 Atherosclerotic heart disease of native coronary artery without angina pectoris: Secondary | ICD-10-CM | POA: Diagnosis not present

## 2017-11-07 DIAGNOSIS — I10 Essential (primary) hypertension: Secondary | ICD-10-CM | POA: Insufficient documentation

## 2017-11-07 NOTE — Progress Notes (Signed)
Internal Medicine Clinic Attending  I saw and evaluated the patient.  I personally confirmed the key portions of the history and exam documented by Dr. Berneice Gandy and I reviewed pertinent patient test results.  The assessment, diagnosis, and plan were formulated together and I agree with the documentation in the resident's note. Chronic cough is likely multifactorial and we need to treat all possible etiologies. There is concern for decongestant and opioid cough med in pt her age and I have isntructed daughter or patient to call if any side effects occur.

## 2017-11-13 ENCOUNTER — Other Ambulatory Visit: Payer: Self-pay | Admitting: Internal Medicine

## 2017-11-13 NOTE — Telephone Encounter (Signed)
Next appt scheduled  11/26/17 with PCP.

## 2017-11-20 ENCOUNTER — Encounter: Payer: Self-pay | Admitting: Cardiovascular Disease

## 2017-11-20 ENCOUNTER — Ambulatory Visit (INDEPENDENT_AMBULATORY_CARE_PROVIDER_SITE_OTHER): Payer: Medicare Other | Admitting: Cardiovascular Disease

## 2017-11-20 VITALS — BP 139/67 | HR 86 | Ht 64.0 in | Wt 127.6 lb

## 2017-11-20 DIAGNOSIS — R0600 Dyspnea, unspecified: Secondary | ICD-10-CM

## 2017-11-20 DIAGNOSIS — R0609 Other forms of dyspnea: Secondary | ICD-10-CM | POA: Diagnosis not present

## 2017-11-20 DIAGNOSIS — E7800 Pure hypercholesterolemia, unspecified: Secondary | ICD-10-CM | POA: Diagnosis not present

## 2017-11-20 DIAGNOSIS — I2510 Atherosclerotic heart disease of native coronary artery without angina pectoris: Secondary | ICD-10-CM | POA: Diagnosis not present

## 2017-11-20 DIAGNOSIS — R700 Elevated erythrocyte sedimentation rate: Secondary | ICD-10-CM

## 2017-11-20 DIAGNOSIS — R5383 Other fatigue: Secondary | ICD-10-CM | POA: Diagnosis not present

## 2017-11-20 DIAGNOSIS — R5382 Chronic fatigue, unspecified: Secondary | ICD-10-CM | POA: Diagnosis not present

## 2017-11-20 DIAGNOSIS — R001 Bradycardia, unspecified: Secondary | ICD-10-CM

## 2017-11-20 DIAGNOSIS — I2583 Coronary atherosclerosis due to lipid rich plaque: Secondary | ICD-10-CM

## 2017-11-20 NOTE — Progress Notes (Signed)
Cardiology Office Note   Date:  11/20/2017   ID:  Christine [name], DOB 01/24/1934, MRN [id number]  PCP:  [name], MD  Cardiologist:   Skeet Latch, MD   No chief complaint on file.    History of Present Illness: Christine [name] is an 84 y.o. female with CAD, carotid stenosis, chronotropic incompetence, hypertension, hyperlipidemia, hypothyroidism, and diabetes who presents for follow up.  Christine [name] was previously a patient of Dr. Aundra Dubin.  She initially established care 08/2011 when she presented with chest pain. She had a left heart catheterization that revealed 40-50% left main stenosis and a 75% diagonal lesion that were medically managed.  She had a repeat cath 09/2015 that was unchanged.  CPX 10/2015 revealed mild obstructive pulmonary disease and severe chronotropic incompetence.  Metoprolol was decreased.  There was also concern for possible exercise-induced pulmonary hypertension or functional MR.  Christine [name] had a 48 hour Holter 03/2016 that showed occasional PACs and PVCs.  In 2017 she had orthostatic hypotension.  She was started on compression stockings and pyridostigmine. She did not tolerate this due to dizziness.  She saw Rosaria Ferries, PA-C, on 07/2017 and reported exertional dyspnea.  She also noted mild chest pain.  She was referred for heart catheterization that revealed no significant changes from prior.  She had 40% left main, 70% ostial D1, and 10% proximal RCA disease.  LVEF was 55-65%.  LVEDP was normal.  Echo 11/07/17 revealed LVEF 65-70% and grade 1 diastolic dysfunction.  PASP was 40 mmHg.  She had aortic valve sclerosis without stenosis.  Christine [name] followed up with Dr. Vaughan Browner on 09/15/17 and continues to report shortness of breath and fatigue.  She was referred for a CPX 09/26/17 that again revealed chronotropic incompetence.  Her heart rate increased from 61 bpm at rest to 87 at max.  Between seeing her pulmonologist and actually having the CPX her metoprolol was  increased to 25 mg daily.  At her last appointment she continued to have significant fatigue with exertion. Metoprolol was discontinued. Since then she continues to feel very fatigued.  She had acute sinusitis in November and December.  She continues to have significant cough and congestion.  She coughs up clear phlegm.  She has no fever or chills.  She saw Dr. Berneice Gandy on 11/06/17 and was noted to have thrush.  Advair was switched to prn.  Valsartan was switched to irbesartan due to the national recall.  She notes that her cough is improving but is still present.  She has been feeling very fatigued and short of breath.  She is staying in bed all day and gets very short of breath when she tries to move around. Today she also notes that her neck has been hurting.  She is accompanied by her daughter who is very concerned because she hasn't been herself lately.  Christine [name] notes that her appetite has been poor and she isn't eating much.  She carried a diagnosis of mild COPD but never smoked.  She had a wood burning stove in the home and had second hand smoke from her husband.  She denies lower extremity edema, orthopnea or PND.     Past Medical History:  Diagnosis Date  . Anemia   . Anginal pain ([place])   . Arthritis    "back, arms, hips; hands" (11/30/2015)  . Benign hypertensive heart and kidney disease with diastolic CHF, NYHA class II and CKD stage III ([place])   . Blood  transfusion 1972   "after daughter born, attempted to give me blood; couldn't give it cause my blood was cold" (09/23/2013)  . CAD (coronary artery disease)    a. LHC 08/23/11: dLM 40-50%, oRI 40%, oCFX 40%, oD1 70% (small and not amenable to PCI).  LM lesion did not appear to be flow limiting.  Medical rx was recommended;  b. Echo 08/23/11: mild LVH, EF 60-65%, grade 1 diast dysfxn, mild BAE, PASP 24;  c. 02/2012  Cath: LM 50d, LAD 50p, D1 70ost, RI 70p, RCA ok->Med Rx;  d. 01/2013 Cardiolite: EF 88, no ischemia/infarct.  . Carotid stenosis    a.  dopplers 10/12:  0-39% bilat ICA;  b. 10/2012 U/S: 0-39% bilat, f/u 1 yr (10/2013).  . Chronic bronchitis   . Depression   . Diverticulosis of colon (without mention of hemorrhage)   . GERD (gastroesophageal reflux disease)   . Heart murmur, systolic    2-D echo in December 2011 showed a normal EF with grade 1 diastolic dysfunction, trivial pulmonary regurgitation and mildly elevated PA pressure at 37 mmHg probably secondary to her COPD.dynamic obstruction-mid cavity obliteration;  b. 02/2012 Echo: EF 60-65%, mild LVH, PASP 77mHg.  .Marland KitchenHistory of stomach ulcers 1970's  . Hyperlipidemia   . Hypertension   . Hypothyroidism   . Migraines    a. Next atypical symptoms in the past. Patient was started on Neurontin for possible neuropathic origin of her pain.  . Polymyalgia rheumatica ([place]   . Shortness of breath on exertion    "just related to angina >1 yr ago" (09/23/2013)  . Sinus arrhythmia   . Type II diabetes mellitus ([place]    a. On oral hypoglycemic agents.    Past Surgical History:  Procedure Laterality Date  . CARDIAC CATHETERIZATION N/A 09/26/2015   Procedure: Right/Left Heart Cath and Coronary Angiography;  Surgeon: DLarey Dresser MD;  [facility];  Service: Cardiovascular;  Laterality: N/A;  . CATARACT EXTRACTION W/ INTRAOCULAR LENS  IMPLANT, BILATERAL Bilateral 1990's  . LEFT HEART CATHETERIZATION WITH CORONARY ANGIOGRAM N/A 03/16/2012   Procedure: LEFT HEART CATHETERIZATION WITH CORONARY ANGIOGRAM;  Surgeon: THillary Bow MD;  [facility];  Service: Cardiovascular;  Laterality: N/A;  . RIGHT/LEFT HEART CATH AND CORONARY ANGIOGRAPHY N/A 08/12/2017   Procedure: RIGHT/LEFT HEART CATH AND CORONARY ANGIOGRAPHY;  Surgeon: JMartinique Peter M, MD;  [facility];  Service: Cardiovascular;  Laterality: N/A;  . VAGINAL HYSTERECTOMY  1976     Current Outpatient Medications  Medication Sig Dispense Refill  . acetaminophen (TYLENOL) 500 MG tablet  Take 1,000 mg by mouth every 6 (six) hours as needed for headache. For pain    . albuterol (PROVENTIL HFA;VENTOLIN HFA) 108 (90 Base) MCG/ACT inhaler Inhale 1-2 puffs into the lungs every 6 (six) hours as needed for wheezing or shortness of breath. 18 g 2  . amLODipine (NORVASC) 10 MG tablet take 1 tablet by mouth once daily 90 tablet 3  . Ascorbic Acid (VITAMIN C PO) Take 1 tablet by mouth daily.     .Marland Kitchenatorvastatin (LIPITOR) 40 MG tablet Take 0.5 tablets (20 mg total) by mouth every evening. 90 tablet 1  . benzocaine-menthol (SORE THROAT LOZENGES) 6-10 MG lozenge Take 1 lozenge by mouth as needed for sore throat. 100 tablet 0  . clopidogrel (PLAVIX) 75 MG tablet take 1 tablet by mouth once daily EVERY EVENING 30 tablet 5  . esomeprazole (NEXIUM) 20 MG capsule Take 1 capsule (20  mg total) by mouth daily with breakfast. 90 capsule 3  . fluconazole (DIFLUCAN) 100 MG tablet Take two tablets on the first day (200 mg) and then one tablet (100 mg) daily until you run out, for total of 7 days of treatment. 8 tablet 0  . fluticasone (FLONASE) 50 MCG/ACT nasal spray Place 1 spray into both nostrils daily. 16 g 2  . glimepiride (AMARYL) 1 MG tablet take 1 tablet by mouth once daily BEFORE BREAKFAST 90 tablet 3  . guaiFENesin-codeine (ROBITUSSIN AC) 100-10 MG/5ML syrup Take 5 mLs by mouth 3 (three) times daily as needed for cough. 120 mL 0  . hydroxypropyl methylcellulose (ISOPTO TEARS) 2.5 % ophthalmic solution Place 1 drop into both eyes at bedtime.     Marland Kitchen imipramine (TOFRANIL) 50 MG tablet take 1 tablet by mouth twice a day (Patient taking differently: take 23m by mouth twice a day) 180 tablet 3  . irbesartan (AVAPRO) 150 MG tablet Take 1 tablet (150 mg total) by mouth daily. 30 tablet 0  . levothyroxine (SYNTHROID, LEVOTHROID) 25 MCG tablet take 1 tablet by mouth once daily 90 tablet 1  . mirtazapine (REMERON) 7.5 MG tablet Take 1 tablet (7.5 mg total) by mouth at bedtime. 30 tablet 2  . Multiple Vitamin  (MULTIVITAMIN WITH MINERALS) TABS tablet Take 1 tablet by mouth daily. 30 tablet 2  . nitroGLYCERIN (NITROSTAT) 0.4 MG SL tablet place 1 tablet under the tongue if needed every 5 minutes for chest pain for 3 doses IF NO RELIEF AFTER FIRST DOSE CALL PRESCRIBER OR 911. 25 tablet 3  . ondansetron (ZOFRAN) 4 MG tablet Take 1 tablet (4 mg total) by mouth daily as needed for nausea or vomiting. 30 tablet 0  . polycarbophil (FIBERCON) 625 MG tablet Take 1 tablet (625 mg total) by mouth daily. 30 tablet 2  . promethazine (PHENERGAN) 12.5 MG tablet take 1 tablet by mouth every 6 hours if needed for nausea and vomiting 30 tablet 6  . pseudoephedrine (SUDAFED 12 HOUR) 120 MG 12 hr tablet Take 1 tablet (120 mg total) by mouth 2 (two) times daily for 21 days. 42 tablet 0  . senna (SENOKOT) 8.6 MG TABS tablet Take 1 tablet (8.6 mg total) by mouth at bedtime as needed for mild constipation. 120 each 1   Current Facility-Administered Medications  Medication Dose Route Frequency Provider Last Rate Last Dose  . albuterol (PROVENTIL) (2.5 MG/3ML) 0.083% nebulizer solution 2.5 mg  2.5 mg Nebulization Once NThomasene Ripple MD        Allergies:   Aspirin; Atarax [hydroxyzine]; Nsaids; Codeine; Haldol [haloperidol]; Other; and Penicillins    Social History:  The patient  reports that  has never smoked. she has never used smokeless tobacco. She reports that she does not drink alcohol or use drugs.   Family History:  The patient's family history includes COPD in her father; Colon cancer in her maternal grandmother; Heart attack in her maternal grandmother; Other in her mother; Pulmonary Hypertension in her daughter.    ROS:  Please see the history of present illness.   Otherwise, review of systems are positive for none.   All other systems are reviewed and negative.    PHYSICAL EXAM: VS:  BP 139/67   Pulse 86   Ht 5' 4"  (1.626 m)   Wt 127 lb 9.6 oz (57.9 kg)   LMP 02/11/1975   BMI 21.90 kg/m  , BMI Body mass  index is 21.9 kg/m. GENERAL:  Tired appearing.  Pausing  to take deep breaths frequently while talking HEENT: Pupils equal round and reactive, fundi not visualized, oral mucosa unremarkable NECK:  No jugular venous distention, waveform within normal limits, carotid upstroke brisk and symmetric, no bruits, no thyromegaly LUNGS:  Clear to auscultation bilaterally HEART:  RRR.  PMI not displaced or sustained,S1 and S2 within normal limits, no S3, no S4, no clicks, no rubs, II/VI systolic murmur at LUSB. ABD:  Flat, positive bowel sounds normal in frequency in pitch, no bruits, no rebound, no guarding, no midline pulsatile mass, no hepatomegaly, no splenomegaly EXT:  2 plus pulses throughout, no edema, no cyanosis no clubbing SKIN:  No rashes no nodules NEURO:  Cranial nerves II through XII grossly intact, motor grossly intact throughout PSYCH:  Cognitively intact, oriented to person place and time    EKG:  EKG is not ordered today. 10/22/17: Sinus rhythm.  Rate 72 bpm.  Prior septal infarct.  LAD.   48 hour Holter 04/12/16: Occasional PACs and PVCs, no worrisome arrhythmias.  CPX 09/26/17: Exercise testing with gas exchange demonstrates moderately reduced functional impairment when compared to matched sedentary norms. There is mild indication for circulatory limitation given elevated VE/VCO2 slope. There was significant chronotropic incompetence. Despite patient's symptoms, results have mildly increased and improved with similar limitations as CPX in 2016.  Heart rate increased from 61 bpm at baseline to 87 bpm at peak stress.  RHC/LHC 08/12/17:  Ost LM lesion, 40 %stenosed.  LM lesion, 40 %stenosed.  Ost 1st Diag to 1st Diag lesion, 70 %stenosed.  Prox RCA to Mid RCA lesion, 10 %stenosed.  The left ventricular systolic function is normal.  LV end diastolic pressure is normal.  The left ventricular ejection fraction is 55-65% by visual estimate.  LV end diastolic pressure is normal.    1. Nonobstructive CAD. Unchanged from 2013 and 2016.  2. Normal LV function 3. Normal LV filling pressures 4. Normal right heart pressures  Echo 09/19/15:  Study Conclusions  - Left ventricle: The cavity size was normal. Wall thickness was   increased in a pattern of moderate LVH. Systolic function was   normal. The estimated ejection fraction was in the range of 60%   to 65%. Wall motion was normal; there were no regional wall   motion abnormalities. Left ventricular diastolic function   parameters were normal. - Atrial septum: No defect or patent foramen ovale was identified.   Recent Labs: 12/25/2016: Magnesium 1.8 06/13/2017: ALT 15 08/11/2017: BUN 16; Creatinine, Ser 1.11; Hemoglobin 12.2; Platelets 346; Potassium 4.3; Sodium 141 08/20/2017: TSH 2.792    Lipid Panel    Component Value Date/Time   CHOL 146 06/13/2017 0804   TRIG 268 (H) 06/13/2017 0804   HDL 46 06/13/2017 0804   CHOLHDL 3.2 06/13/2017 0804   CHOLHDL 4 08/05/2014 0933   VLDL 33.8 08/05/2014 0933   LDLCALC 46 06/13/2017 0804      Wt Readings from Last 3 Encounters:  11/20/17 127 lb 9.6 oz (57.9 kg)  11/06/17 137 lb 3.2 oz (62.2 kg)  10/30/17 137 lb 3.2 oz (62.2 kg)      ASSESSMENT AND PLAN:  # Exertional dyspnea:  # Chronotropic incompetence:  Heart rate only increased to 87 with exercise, which is 63% max predicted.  Her heart rate has increased to 86 bpm today after stopping metoprolol.  No change in her symptoms.  CT was negative for PE and ddidn't show any significant pulmonary disease.  Echo was unremarkable.  Pulmonary pressures were very mildly elevated  but not enough to explain her symptoms.  She also had grade 1 diastolic dysfunction but her LVEDP was normal in cath and she has no evidence of heart failure on exam.  Her coronary disease was unchanged on cath from 2013.  I do not think that her symptoms are cardiac  We will get a 24-hour  Holter monitor to make sure that her chronotropic  incompetence has improved.  ESR and ANA were both mildly abnormal.  I suspect that this is age-related.  However, given that we have no other explanation for her symptoms, I will refer her to rheumatology to see if there could be an autoimmune component.  I suspect that deconditioning is also contributing.  I encouraged her to start Boost supplementation and to discuss physical therapy with her PCP.  She will start trying to walk more.  # Medically managed CAD:  Christine [name] has known left main and diagonal lesions that are medically managed and unchanged from 2013. Her symptoms are not consistent with angina.  Continue amlodipine, clopidogrel, atorvastatin.  She is not on a beta blocker 2/2 bradycardia.  # Hypertension: Blood pressure slightly above goal. However she has a history of orthostasis. Therefore we'll not make any changes. Continue amlodipine and irbesartan.  # Hyperlipidemia: LDL 46 on 05/2017.  Triglycerides are elevated.  Continue atorvastatin.   Time spent: 30 minutes-Greater than 50% of this time was spent in counseling, explanation of diagnosis, planning of further management, and coordination of care.     Current medicines are reviewed at length with the patient today.  The patient does not have concerns regarding medicines.  The following changes have been made:  no change  Labs/ tests ordered today include:   Orders Placed This Encounter  Procedures  . Ambulatory referral to Rheumatology  . Holter monitor - 24 hour    Disposition:   FU with Nickolas Chalfin C. Oval Linsey, MD, [facility] in 4 months.    This note was written with the assistance of speech recognition software.  Please excuse any transcriptional errors.  Signed, Zarayah Lanting C. Oval Linsey, MD, [facility]  11/20/2017 12:30 PM    [facility]

## 2017-11-20 NOTE — Patient Instructions (Addendum)
Medication Instructions:  Your physician recommends that you continue on your current medications as directed. Please refer to the Current Medication list given to you today.  Labwork: none  Testing/Procedures: Your physician has recommended that you wear a holter monitor. Holter monitors are medical devices that record the heart's electrical activity. Doctors most often use these monitors to diagnose arrhythmias. Arrhythmias are problems with the speed or rhythm of the heartbeat. The monitor is a small, portable device. You can wear one while you do your normal daily activities. This is usually used to diagnose what is causing palpitations/syncope (passing out). 24 hour holter [place] [address]  Follow-Up: Your physician recommends that you schedule a follow-up appointment in: [place] have been referred to RHEUMATOLOGY.   If you need a refill on your cardiac medications before your next appointment, please call your pharmacy.

## 2017-11-26 ENCOUNTER — Encounter: Payer: Self-pay | Admitting: Internal Medicine

## 2017-11-26 ENCOUNTER — Other Ambulatory Visit: Payer: Self-pay

## 2017-11-26 ENCOUNTER — Telehealth: Payer: Self-pay | Admitting: Cardiovascular Disease

## 2017-11-26 ENCOUNTER — Ambulatory Visit (INDEPENDENT_AMBULATORY_CARE_PROVIDER_SITE_OTHER): Payer: Medicare Other | Admitting: Internal Medicine

## 2017-11-26 VITALS — BP 154/54 | HR 82 | Temp 97.4°F | Ht 64.0 in | Wt 137.2 lb

## 2017-11-26 DIAGNOSIS — R053 Chronic cough: Secondary | ICD-10-CM

## 2017-11-26 DIAGNOSIS — E039 Hypothyroidism, unspecified: Secondary | ICD-10-CM

## 2017-11-26 DIAGNOSIS — R05 Cough: Secondary | ICD-10-CM | POA: Diagnosis not present

## 2017-11-26 DIAGNOSIS — R0600 Dyspnea, unspecified: Secondary | ICD-10-CM

## 2017-11-26 DIAGNOSIS — Z7984 Long term (current) use of oral hypoglycemic drugs: Secondary | ICD-10-CM

## 2017-11-26 DIAGNOSIS — Z7902 Long term (current) use of antithrombotics/antiplatelets: Secondary | ICD-10-CM | POA: Diagnosis not present

## 2017-11-26 DIAGNOSIS — R5383 Other fatigue: Secondary | ICD-10-CM | POA: Diagnosis not present

## 2017-11-26 DIAGNOSIS — R5382 Chronic fatigue, unspecified: Secondary | ICD-10-CM

## 2017-11-26 DIAGNOSIS — I6529 Occlusion and stenosis of unspecified carotid artery: Secondary | ICD-10-CM

## 2017-11-26 DIAGNOSIS — E119 Type 2 diabetes mellitus without complications: Secondary | ICD-10-CM | POA: Diagnosis not present

## 2017-11-26 DIAGNOSIS — R0609 Other forms of dyspnea: Secondary | ICD-10-CM

## 2017-11-26 LAB — GLUCOSE, CAPILLARY: GLUCOSE-CAPILLARY: 116 mg/dL — AB (ref 65–99)

## 2017-11-26 NOTE — Patient Instructions (Signed)
Ms. Milos - -  Thank you for coming in today.  I am going to do a few more lab tests to see if we can further define your fatigue.  We will also follow up on your rheumatology consult.   Thank you!  Come back to see me in 6 weeks.

## 2017-11-26 NOTE — Telephone Encounter (Signed)
New Message  Pt call requesting to speak with RN. Pt states she want to rely a message from one of her providers to Dr. Oval Linsey. Please call back to discuss

## 2017-11-26 NOTE — Telephone Encounter (Signed)
Advised patient Christine [name] has sent information to [facility] and to call back Monday if she has not heard back

## 2017-11-26 NOTE — Progress Notes (Signed)
Subjective:    [name], female    DOB: 01/24/1934, 84 y.o.   MRN: [id number]  1 month follow up for fatigue and other medical issues.   HPI  Christine [name] is an 84yo woman with PMH of allergic rhinitis, carotid stenosis, chronic cough, CAD, HTN, fatigue, GERD, hypothyroidism, osteopenia, DM2 who presents for follow up.   Since I last saw her, Christine [name] has been seen by Pulmonology for DOE and fatigue.  She had an extensive work up which did not show a primary pulmonary disease.  They speculated either cardiac causes or PMR may be the cause (she was previously treated for PMR with low dose steroids many years ago, but on evaluation by Christine [name] in Rheumatology she was thought not to have PMR and to have lumbar spondylosis instead and she improved with spinal injections.  She was weaned off of steroids; as Christine [name] retired, she has not been seeing a rheumatologist since 2015).  She was seen multiple times by Cardiology.  There is concern for chronotropic incompetence and/or HF and a TTE has been ordered which showed Grade 1 HFpEF (not unlikely for age) and PAP of 16.  Christine [name] is further considering a consult with EP for possible electrophysiological explanation of symptoms.  She is now off of her beta blocker.    Finally, she has been seen twice in clinic for an acute URI and chronic cough.  At last visit, decongestant, cough medication, daily PPI and change of BP medications were made to see if this would help.  She has had a recent HRCT of the chest which did not show an explanation for cough.  Today, Christine [name] reports that her cough is much better.  She feels it was from her sinuses.  She is taking the nexium.  She would like to go back on the Advair.  We discussed her symptoms and she notes that she does not have improvement of her symptoms while on the advair.  I thus advised her to stop the medication.  She is very well versed on her test results from Christine [name] and Dr.  Oval [name].  She is frustrated that there is not an answer for her fatigue.  We discussed doing some further blood work today to see if there is something obvious to explain her symptoms.  Based on review of her records, she has never been screened for HCV.  I will also trend her ESR to see if this is on the rise.  She has no pain, only fatigue.    Review of Systems  Constitutional: Positive for fatigue. Negative for chills, fever and unexpected weight change.  HENT: Positive for congestion. Negative for sinus pressure.   Respiratory: Positive for shortness of breath. Negative for cough and choking.   Cardiovascular: Negative for chest pain and leg swelling.  Musculoskeletal: Negative for arthralgias and back pain.  Neurological: Negative for dizziness and weakness.       Objective:   Physical Exam  Constitutional: She is oriented to person, place, and time. She appears well-developed and well-nourished. No distress.  HENT:  Head: Normocephalic and atraumatic.  Mouth/Throat: Oropharynx is clear and moist. No oropharyngeal exudate.  Eyes: Conjunctivae are normal. No scleral icterus.  Cardiovascular: Normal rate and regular rhythm.  No murmur heard. Pulmonary/Chest: Effort normal and breath sounds normal. No respiratory distress. She has no wheezes.  Neurological: She is alert and oriented to person, place, and time.  Psychiatric: She has a  normal mood and affect. Her behavior is normal.  Vitals reviewed.   HCV, CMET, ESR today.      Assessment & Plan:  RTC in 6 weeks.

## 2017-11-26 NOTE — Telephone Encounter (Signed)
Returned call to patient of Dr. Oval Linsey.   Patient states Dr. Oval Linsey recommended a rheumatology referral and patient called [facility]. She was informed a form will need to be filled out by Dr. Oval Linsey and will need to be submitted to this practice and they will evaluate and then call patient with an appointment.   Routed to [place], [place] & scheduling pool for follow up on referral

## 2017-11-27 LAB — CMP14 + ANION GAP
ALBUMIN: 4.5 g/dL (ref 3.5–4.7)
ALK PHOS: 175 IU/L — AB (ref 39–117)
ALT: 14 IU/L (ref 0–32)
AST: 16 IU/L (ref 0–40)
Albumin/Globulin Ratio: 1.6 (ref 1.2–2.2)
Anion Gap: 15 mmol/L (ref 10.0–18.0)
BUN / CREAT RATIO: 14 (ref 12–28)
BUN: 15 mg/dL (ref 8–27)
Bilirubin Total: 0.2 mg/dL (ref 0.0–1.2)
CO2: 22 mmol/L (ref 20–29)
Calcium: 9.8 mg/dL (ref 8.7–10.3)
Chloride: 100 mmol/L (ref 96–106)
Creatinine, Ser: 1.04 mg/dL — ABNORMAL HIGH (ref 0.57–1.00)
GFR, EST AFRICAN AMERICAN: 58 mL/min/{1.73_m2} — AB (ref >59)
GFR, EST NON AFRICAN AMERICAN: 51 mL/min/{1.73_m2} — AB (ref >59)
GLOBULIN, TOTAL: 2.8 g/dL (ref 1.5–4.5)
Glucose: 164 mg/dL — ABNORMAL HIGH (ref 65–99)
Potassium: 4.8 mmol/L (ref 3.5–5.2)
SODIUM: 137 mmol/L (ref 134–144)
TOTAL PROTEIN: 7.3 g/dL (ref 6.0–8.5)

## 2017-11-27 LAB — HEPATITIS C ANTIBODY: Hep C Virus Ab: <0.1 s/co ratio (ref 0.0–0.9)

## 2017-11-27 LAB — SEDIMENTATION RATE: Sed Rate: 28 mm/hr (ref 0–40)

## 2017-11-28 ENCOUNTER — Encounter: Payer: Self-pay | Admitting: Internal Medicine

## 2017-11-28 NOTE — Assessment & Plan Note (Signed)
Repeat vascular ultrasound on 12/3 2018 showed no significant stenosis.  Will resolve problem

## 2017-11-28 NOTE — Assessment & Plan Note (Signed)
Improved with treatment of sinusitis.  She is taking the nexium daily and changed to irbesartan.  Today she says there is no issue.  She will take cough syrup when needed.  She is frustrated about her advair.  We discussed it's use and she admits that it doesn't really change her cough or SOB, so I advised her to stop taking it.   Plan Continue to monitor for changes.

## 2017-11-28 NOTE — Assessment & Plan Note (Signed)
She has had extensive work up for this issue including Pulmonary consult with HRCT and other work up.  She has seen cardiology for extensive work up as well.  Only issue found is possible chronotropic incompetence, thought to be related to beta blockade.  She has stopped beta blocker and will do another holter monitor to see if her HR improves now.    At present I do not have a clear explanation for her breathing difficulties.  Fatigue seems to be the larger component.

## 2017-11-28 NOTE — Assessment & Plan Note (Addendum)
Both Cardiology and Pulmonary have suggested possible rheumatological cause.  She has previously been diagnosed with PMR and was on chronic steroids, however, she had a very thorough evaluation with Dr. Charlestine Night in Rheum in 2015 and he did not think she had PMR.  Her symptoms of pain improved with his therapy and she was weaned off of steroids.   I rechecked an ESR and it was normal today.  Usually for PMR ESR is > 100, however in up to 5% of cases it can be below 40.  She has no pain symptoms today, just fatigue.  There is likely a component of deconditioning as she had a prolonged illness last year.  She does not have a normocytic anemia or any other electrolyte abnormalities except a persistently elevated ALP which has been worked up with normal vitamin D and GGT and PTH.  She had a bone scan which showed osteopenia.    Today, I will complete work up with HCV Ab (neg) and a CMET (liver function normal).  I think a rheum consult would be useful.  She has no obvious signs of lupus.  Would be curious if they consider PMR a possibility.   Rheum consult placed by Cardiology, I will ask our consult team to make sure this goes through.

## 2017-11-28 NOTE — Assessment & Plan Note (Signed)
Synthroid dosing changed during last few months and repeat TSH at goal of < 4 for her.   Plan Continue synthroid.

## 2017-11-28 NOTE — Assessment & Plan Note (Signed)
Reviewed BS log today, she is very well controlled with only occasional high readings, average glucose is around 139.  We did not discuss in detail today.   She is going to continue glimepiride.

## 2017-12-01 ENCOUNTER — Ambulatory Visit (INDEPENDENT_AMBULATORY_CARE_PROVIDER_SITE_OTHER): Payer: Medicare Other

## 2017-12-01 DIAGNOSIS — R001 Bradycardia, unspecified: Secondary | ICD-10-CM

## 2017-12-02 ENCOUNTER — Telehealth: Payer: Self-pay | Admitting: Internal Medicine

## 2017-12-02 ENCOUNTER — Encounter: Payer: Self-pay | Admitting: Cardiovascular Disease

## 2017-12-02 NOTE — Telephone Encounter (Signed)
PT WOULD LIKE A CALL BACK ABOUT HER TEST RESULTS

## 2017-12-02 NOTE — Telephone Encounter (Signed)
Left message to call back  

## 2017-12-02 NOTE — Telephone Encounter (Signed)
New message  Pt verbalized that she is calling for RN  She did not disclose reason

## 2017-12-03 ENCOUNTER — Telehealth: Payer: Self-pay | Admitting: *Deleted

## 2017-12-03 NOTE — Telephone Encounter (Signed)
Okay!  I will call Ms. Saez tmw.  Thanks.  Is there another rheumatologist in town she can see, former patient of Dr. Charlestine Night.

## 2017-12-03 NOTE — Telephone Encounter (Signed)
Called Ms. Martorano to address blood work.  Confirmed identity with birthdate and name.   HCV ab negative  ESR back to normal (<40).   Other blood work shows chronic ALP elevation which is well known.    Patient continues to have severe fatigue.  No obvious cause from Cardiac or pulmonary standpoint.  She has just completed another holter monitor after coming off of beta blockade, but has not had this read yet.  Cardiology and Pulmonary recommended rheum evaluation, but it appears this consult was denied by Dr. Estanislado Pandy.  I am not clear on the reason, so I have asked my nurse to investigate.    Will call patient with results of investigation.

## 2017-12-03 NOTE — Telephone Encounter (Signed)
-----   Message from [name], MD sent at 12/03/2017  2:42 PM EST ----- Regarding: Rheum consult Hey [name] - -  Rheum consult was denied by Dr. Estanislado Pandy.  Can you call over there and see why it was denied?   Thanks!

## 2017-12-03 NOTE — Telephone Encounter (Signed)
Called Rheumatology and appointment 2/7 at 8:30 am with Leafy Kindle  Left message to call back

## 2017-12-03 NOTE — Telephone Encounter (Signed)
Follow up    Patient is returning call from yesterday. Please call.

## 2017-12-03 NOTE — Telephone Encounter (Signed)
This encounter was created in error - please disregard.

## 2017-12-03 NOTE — Telephone Encounter (Signed)
F/u Message ° °Pt returning RN call  °

## 2017-12-03 NOTE — Telephone Encounter (Signed)
Advised patient of appointment date and time. 

## 2017-12-03 NOTE — Telephone Encounter (Signed)
The person I talked to stated Dr Estanislado Pandy looks at all her referrals personally and when one is denied, she feels she's "not the right physician for the patient" - only explanation.

## 2017-12-05 ENCOUNTER — Other Ambulatory Visit: Payer: Self-pay | Admitting: *Deleted

## 2017-12-06 MED ORDER — IRBESARTAN 150 MG PO TABS: 150.0000 mg | ORAL_TABLET | Freq: Every day | ORAL | 0 refills | Status: DC

## 2017-12-09 ENCOUNTER — Other Ambulatory Visit: Payer: Self-pay | Admitting: Internal Medicine

## 2017-12-09 DIAGNOSIS — R5382 Chronic fatigue, unspecified: Secondary | ICD-10-CM

## 2017-12-18 ENCOUNTER — Telehealth: Payer: Self-pay | Admitting: Internal Medicine

## 2017-12-18 NOTE — Telephone Encounter (Signed)
Patient would leave message with Bonnita Nasuti.  Patient is taking irbesartan 150mg  , patient is very dizzy, she is wondering if she should not taking medicine

## 2017-12-25 DIAGNOSIS — Z6823 Body mass index (BMI) 23.0-23.9, adult: Secondary | ICD-10-CM | POA: Diagnosis not present

## 2017-12-25 DIAGNOSIS — R0602 Shortness of breath: Secondary | ICD-10-CM | POA: Diagnosis not present

## 2017-12-25 DIAGNOSIS — R5382 Chronic fatigue, unspecified: Secondary | ICD-10-CM | POA: Diagnosis not present

## 2017-12-25 DIAGNOSIS — R768 Other specified abnormal immunological findings in serum: Secondary | ICD-10-CM | POA: Diagnosis not present

## 2017-12-30 NOTE — Telephone Encounter (Signed)
Called pt to f/u - no answer; left message to call back if continues to have problems.

## 2018-01-05 ENCOUNTER — Ambulatory Visit: Payer: Self-pay | Admitting: Cardiovascular Disease

## 2018-01-09 ENCOUNTER — Telehealth: Payer: Self-pay | Admitting: Internal Medicine

## 2018-01-09 NOTE — Telephone Encounter (Signed)
Patient would like a call back about her test strips.  Pt states she has not been able to get them for 1 week.

## 2018-01-09 NOTE — Telephone Encounter (Addendum)
Called Ms. Reffitt- she called her insurance and they no longer cover the one touch but cover the accu chek instead. She says the pharmacy will be able to fill her prescription in the next hour. confirmed this information with [facility].

## 2018-01-14 ENCOUNTER — Encounter (INDEPENDENT_AMBULATORY_CARE_PROVIDER_SITE_OTHER): Payer: Self-pay

## 2018-01-14 ENCOUNTER — Ambulatory Visit (INDEPENDENT_AMBULATORY_CARE_PROVIDER_SITE_OTHER): Payer: Medicare Other | Admitting: Internal Medicine

## 2018-01-14 ENCOUNTER — Encounter: Payer: Self-pay | Admitting: Internal Medicine

## 2018-01-14 ENCOUNTER — Other Ambulatory Visit: Payer: Self-pay

## 2018-01-14 VITALS — BP 156/57 | HR 80 | Temp 97.8°F | Ht 64.0 in | Wt 137.4 lb

## 2018-01-14 DIAGNOSIS — Z7902 Long term (current) use of antithrombotics/antiplatelets: Secondary | ICD-10-CM | POA: Diagnosis not present

## 2018-01-14 DIAGNOSIS — Z8739 Personal history of other diseases of the musculoskeletal system and connective tissue: Secondary | ICD-10-CM | POA: Diagnosis not present

## 2018-01-14 DIAGNOSIS — R5383 Other fatigue: Secondary | ICD-10-CM | POA: Diagnosis not present

## 2018-01-14 DIAGNOSIS — H6123 Impacted cerumen, bilateral: Secondary | ICD-10-CM

## 2018-01-14 DIAGNOSIS — R748 Abnormal levels of other serum enzymes: Secondary | ICD-10-CM

## 2018-01-14 DIAGNOSIS — R011 Cardiac murmur, unspecified: Secondary | ICD-10-CM

## 2018-01-14 DIAGNOSIS — F333 Major depressive disorder, recurrent, severe with psychotic symptoms: Secondary | ICD-10-CM | POA: Diagnosis not present

## 2018-01-14 DIAGNOSIS — R0600 Dyspnea, unspecified: Secondary | ICD-10-CM | POA: Diagnosis not present

## 2018-01-14 DIAGNOSIS — Z79899 Other long term (current) drug therapy: Secondary | ICD-10-CM | POA: Diagnosis not present

## 2018-01-14 DIAGNOSIS — Z7984 Long term (current) use of oral hypoglycemic drugs: Secondary | ICD-10-CM | POA: Diagnosis not present

## 2018-01-14 DIAGNOSIS — G5603 Carpal tunnel syndrome, bilateral upper limbs: Secondary | ICD-10-CM

## 2018-01-14 DIAGNOSIS — H9313 Tinnitus, bilateral: Secondary | ICD-10-CM

## 2018-01-14 DIAGNOSIS — E119 Type 2 diabetes mellitus without complications: Secondary | ICD-10-CM | POA: Diagnosis not present

## 2018-01-14 DIAGNOSIS — I10 Essential (primary) hypertension: Secondary | ICD-10-CM

## 2018-01-14 DIAGNOSIS — Z79891 Long term (current) use of opiate analgesic: Secondary | ICD-10-CM | POA: Diagnosis not present

## 2018-01-14 DIAGNOSIS — R42 Dizziness and giddiness: Secondary | ICD-10-CM

## 2018-01-14 DIAGNOSIS — R5382 Chronic fatigue, unspecified: Secondary | ICD-10-CM

## 2018-01-14 LAB — POCT GLYCOSYLATED HEMOGLOBIN (HGB A1C): Hemoglobin A1C: 6.7

## 2018-01-14 LAB — GLUCOSE, CAPILLARY: Glucose-Capillary: 119 mg/dL — ABNORMAL HIGH (ref 65–99)

## 2018-01-14 MED ORDER — HYDROCODONE-ACETAMINOPHEN 5-300 MG PO TABS: 1.0000 | ORAL_TABLET | Freq: Four times a day (QID) | ORAL | 0 refills | Status: AC | PRN

## 2018-01-14 MED ORDER — IRBESARTAN 150 MG PO TABS: 150.0000 mg | ORAL_TABLET | Freq: Every day | ORAL | 6 refills | Status: DC

## 2018-01-14 NOTE — Assessment & Plan Note (Signed)
I reviewed previous work up.  She has a normal GGT which makes this likely from bone disease.  She has intermittent elevation for the last 12 years at least.  She had a normal PTH, normal TSH, normal Vitamin D and calcium.  DEXA showed osteopenia.    Plan Continue to monitor.

## 2018-01-14 NOTE — Assessment & Plan Note (Signed)
Associated symptoms include dizziness and DOE.  She has had extensive cardiac and pulmonary work up.  She has been seen by rheumatology who do not think this is due to a rheumatologic condition.  She has multiple risk factors for disequilibrium including age, hearing loss, chronic diseases and polypharmacy (including psychoactive medications).  I believe that she is also deconditioned after a prolonged hospital stay last year.  She has no acute neurological findings this morning.   Plan PT evaluation [place] for impacted wax today Follow up with audiology for hearing aids Orthostatics normal, continue blood pressure medications Follow up after PT evaluation for further discussion

## 2018-01-14 NOTE — Assessment & Plan Note (Signed)
BP was elevated on initial check today at 156/57.  However, improved during orthostatic BP checks.  Given her lightheadedness, age and low diastolic BP, I am not inclined to treat her further to a lower systolic BP at this time.   Plan Continue irbesartan and amlodipine.   As her lightheadedness did not improve with discontinuation of irbesartan, no need to stop at this time.

## 2018-01-14 NOTE — Progress Notes (Signed)
Subjective:    [name], female    DOB: 01/24/1934, 84 y.o.   MRN: [id number]  CC: 6 week follow up for fatigue.   HPI  Christine [name] is an 84 yo woman with PMH of HTN, DM2, MDD who presents for follow up of fatigue and DOE.  We did a work up last visit which did not give a clear picture.  She has been seen and evaluated extensively by cardiology and pulmonary who advised an evaluation by rheumatology (she has a reported history of PMR, but her last rheumatologist was not convinced she had this.  Her last ESR was not consistent with this diagnosis).  One rheumatologist declined the consult and we have sent consult to a second provider.  She is a former patient of Dr. Charlestine Night.    Today, Christine [name] reports that her main issue remains fatigue.  She notes that it comes and goes, that she is feeling fatigued today. She has been to rheumatology and they do not feel this is a rheumatological disorder.  She has now been assessed by cardiology, pulmonary and rheumatology with no clear cause.  We discussed this at length. She had a holter monitor which did not show any concerning finding.  I started to discuss PT with her, and then she reported severe lightheadedness.  She has had this issue for a long while.  We attempted to change her medications back in 2017 due to bradycardia and she is no longer on a beta blocker.  At that time, she had documented orthostatic hypotension.  We also attempted to decrease her imipramine, but were not successful.  She has since that time had a hospitalization for severe MDD with psychosis.  She is now only on mirtazapine.  I reviewed her blood work and she does not have worsening renal failure, thyroid disease or anemia to explain her symptoms.  She does have a chronically (since 2007) elevated ALP, but this has been worked up and findings were normal.  She attempted to stop her irbesartan and see if this helped, but she noticed no change in her lightheadedness.  I  think she likely has deconditioning vs. Multifactorial disequilibrium.  She also has tinnitus, difficulty hearing and wax impacted in bilateral ear canals which may be contributing.  She also has a lowering A1C and possibly low morning blood sugars.  Reviewed BS log which showed she is only checking her BS around mid day and they are normal then.  Further associated (chronic) symptoms include non pulsatile tinnitus, hearing loss.  She does not have functional hearing aids.    Orthostatic BP check was normal (lying 139/59, 78; sitting 132/59, 76; standing 143/62, 78)  She had an A1C which was checked today and was 6.7.  She had lower blood sugars this morning and does not eat until 10am.  She further is requesting pain medication for her hand.  She gets injections for CTS and uses pain medications until she can get in to be seen.  She uses 1/2 tablet of hydrocodone-apap 5-325.  This was last filled in 2016 per our records.  She has an appointment on March 14 for injection.     Review of Systems  Constitutional: Positive for fatigue. Negative for activity change and appetite change.  Respiratory: Negative for cough, choking and shortness of breath.   Cardiovascular: Negative for chest pain and leg swelling.  Neurological: Positive for light-headedness. Negative for dizziness, syncope, facial asymmetry, weakness and numbness.  Psychiatric/Behavioral:  Positive for decreased concentration and dysphoric mood.       Objective:   Physical Exam  Constitutional: She is oriented to person, place, and time. She appears well-developed and well-nourished. No distress.  HENT:  Left and right ear canal with impacted wax.  Very hard of hearing.   Eyes: Conjunctivae are normal. No scleral icterus.  Cardiovascular: Normal rate and regular rhythm.  Murmur heard. Pulmonary/Chest: Effort normal and breath sounds normal. No respiratory distress. She has no wheezes.  Neurological: She is alert and oriented to  person, place, and time. She exhibits normal muscle tone.  She had a normal gait walking in.  She has preserved strength in her upper and lower extremities.   Skin: Skin is warm and dry.  Vitals reviewed.   No labs needed today.       Assessment & Plan:  RTC in 1-2 months, sooner if needed.

## 2018-01-14 NOTE — Patient Instructions (Addendum)
Christine [name] - -  For your dizziness, I think you have disequilibrium related to your medical problems and possibly to an inner ear problem.  I think you should see physical therapy.  I have placed a consult for this service.  Please come back to the clinic in 1-2 months to be evaluated further.   Please make an appointment in [facility] with one of our other doctors to be seen.  Thanks!  You can use debrox drops 5-10 drops twice daily for ear wax.   FOLLOW-UP INSTRUCTIONS When:  1-2 months For: dizziness, disequilibrium after PT What to bring: Yourself.

## 2018-01-14 NOTE — Assessment & Plan Note (Signed)
A1C was well controlled today at 6.7.  She has an LDL of 46 on last check.  She is taking her medication, glimepiride without issue and she brought in her BS log today.  She may be having low blood sugars as she takes her medications in the morning and does not eat until 10am.  Her lowest measured was 74 on the BS log.  She reports no change to vision, no new neuropathic symptoms.    Plan Continue glimepiride.  If A1C is lowering at next visit, consider stopping medications given her comorbidities.  She had an eye exam last year and foot exam is up to date.

## 2018-01-14 NOTE — Assessment & Plan Note (Signed)
She has intermittent pain and gets injections once every few months.  She is due for an injection early next month. In the interim between injections, she takes low dose hydrocodone-apap.  Last fill was a few years ago.   Plan Hydrocodone-apap #15.  This is not a long term medication.   Follow up with orthopedics for injections

## 2018-01-14 NOTE — Assessment & Plan Note (Signed)
Currently she is on mirtazapine and imipramine.  Mirtazapine can cause dizziness in about 7% of people.  She was having similar symptoms prior to starting this however.  She has been on imipramine for decades, but this could certainly be contributing.   If PT is not helpful with her symptoms, consideration should be taken to wean her off of mirtazapine to see if this helps.  However, this was started after a very severe hospitalization with significant MDD and psychosis.  We may need assistance of psychiatry if she does not do well off of this medication.

## 2018-01-29 DIAGNOSIS — M13842 Other specified arthritis, left hand: Secondary | ICD-10-CM | POA: Diagnosis not present

## 2018-01-29 DIAGNOSIS — G5601 Carpal tunnel syndrome, right upper limb: Secondary | ICD-10-CM | POA: Diagnosis not present

## 2018-02-02 ENCOUNTER — Encounter: Payer: Self-pay | Admitting: Physical Therapy

## 2018-02-02 ENCOUNTER — Ambulatory Visit: Payer: Medicare Other | Attending: Internal Medicine | Admitting: Physical Therapy

## 2018-02-02 DIAGNOSIS — R42 Dizziness and giddiness: Secondary | ICD-10-CM | POA: Insufficient documentation

## 2018-02-02 DIAGNOSIS — R2681 Unsteadiness on feet: Secondary | ICD-10-CM | POA: Diagnosis not present

## 2018-02-02 NOTE — Therapy (Signed)
[facility] [address] [place], [place], [address] Phone: [phone]   Fax:  [phone]  Physical Therapy Evaluation  Patient Details  Name: Christine [name] MRN: [id number] Date of Birth: 01/24/1934 Referring Provider: Dr. Gilles Chiquito   Encounter Date: 02/02/2018  PT End of Session - 02/02/18 1013    Visit Number  1    Number of Visits  6    Date for PT Re-Evaluation  03/16/18    Authorization Type  Medicare/Medicaid    PT Start Time  0930    PT Stop Time  1012    PT Time Calculation (min)  42 min    Activity Tolerance  Patient tolerated treatment well    Behavior During Therapy  [facility] for tasks assessed/performed       Past Medical History:  Diagnosis Date  . Anemia   . Anginal pain ([place])   . Arthritis    "back, arms, hips; hands" (11/30/2015)  . Benign hypertensive heart and kidney disease with diastolic CHF, NYHA class II and CKD stage III ([place])   . Blood transfusion 1972   "after daughter born, attempted to give me blood; couldn't give it cause my blood was cold" (09/23/2013)  . CAD (coronary artery disease)    a. LHC 08/23/11: dLM 40-50%, oRI 40%, oCFX 40%, oD1 70% (small and not amenable to PCI).  LM lesion did not appear to be flow limiting.  Medical rx was recommended;  b. Echo 08/23/11: mild LVH, EF 60-65%, grade 1 diast dysfxn, mild BAE, PASP 24;  c. 02/2012  Cath: LM 50d, LAD 50p, D1 70ost, RI 70p, RCA ok->Med Rx;  d. 01/2013 Cardiolite: EF 88, no ischemia/infarct.  . Carotid stenosis    a. dopplers 10/12:  0-39% bilat ICA;  b. 10/2012 U/S: 0-39% bilat, f/u 1 yr (10/2013).  . Chronic bronchitis   . Depression   . Diverticulosis of colon (without mention of hemorrhage)   . GERD (gastroesophageal reflux disease)   . Heart murmur, systolic    2-D echo in December 2011 showed a normal EF with grade 1 diastolic dysfunction, trivial pulmonary regurgitation and mildly elevated PA pressure at 37 mmHg probably  secondary to her COPD.dynamic obstruction-mid cavity obliteration;  b. 02/2012 Echo: EF 60-65%, mild LVH, PASP 61mmHg.  Marland Kitchen History of stomach ulcers 1970's  . Hyperlipidemia   . Hypertension   . Hypothyroidism   . Migraines    a. Next atypical symptoms in the past. Patient was started on Neurontin for possible neuropathic origin of her pain.  . Polymyalgia rheumatica ([place])   . Shortness of breath on exertion    "just related to angina >1 yr ago" (09/23/2013)  . Sinus arrhythmia   . Type II diabetes mellitus ([place])    a. On oral hypoglycemic agents.    Past Surgical History:  Procedure Laterality Date  . CARDIAC CATHETERIZATION N/A 09/26/2015   Procedure: Right/Left Heart Cath and Coronary Angiography;  Surgeon: Larey Dresser, MD;  Location: [facility];  Service: Cardiovascular;  Laterality: N/A;  . CATARACT EXTRACTION W/ INTRAOCULAR LENS  IMPLANT, BILATERAL Bilateral 1990's  . LEFT HEART CATHETERIZATION WITH CORONARY ANGIOGRAM N/A 03/16/2012   Procedure: LEFT HEART CATHETERIZATION WITH CORONARY ANGIOGRAM;  Surgeon: Hillary Bow, MD;  Location: [facility] C[facility] LAB;  Service: Cardiovascular;  Laterality: N/A;  . RIGHT/LEFT HEART CATH AND CORONARY ANGIOGRAPHY N/A 08/12/2017   Procedure: RIGHT/LEFT HEART CATH AND CORONARY ANGIOGRAPHY;  Surgeon: Martinique, Peter M, MD;  Location: [facility];  Service: Cardiovascular;  Laterality: N/A;  . VAGINAL HYSTERECTOMY  1976    There were no vitals filed for this visit.   Subjective Assessment - 02/02/18 0933    Subjective  Pt is an 84 y/o female who presents to OPPT for fatigue and SOB since July 2018.  Pt also expresses dizziness x 2 months, and concered about BP medication changes.  Pt reports symptoms improved, but this morning had increased dizziness.  Pt expresses improvements in fatigue, but still with intermittent symptoms.    Limitations  Standing;Walking    Patient Stated Goals  improve dizziness, "find out what it is"    Currently in  Pain?  No/denies         [facility] PT Assessment - 02/02/18 0936      Assessment   Medical Diagnosis  vertigo, deconditioning, fatigue    Referring Provider  Dr. Gilles Chiquito    Onset Date/Surgical Date  -- July 2018    Hand Dominance  Right    Next MD Visit  02/13/18    Prior Therapy  none      Precautions   Precautions  None      Restrictions   Weight Bearing Restrictions  No      Balance Screen   Has the patient fallen in the past 6 months  No    Has the patient had a decrease in activity level because of a fear of falling?   No    Is the patient reluctant to leave their home because of a fear of falling?   No      Home Environment   Living Environment  Private residence    Living Arrangements  Children daughter    Available Help at Discharge  Family;Available PRN/intermittently    Type of Home  House    Home Access  Stairs to enter    Entrance Stairs-Number of Steps  2    Entrance Stairs-Rails  None    Home Layout  Two level;Bed/bath upstairs    Alternate Level Stairs-Number of Steps  15    Alternate Level Stairs-Rails  Left      Prior Function   Level of Independence  Independent    Vocation  Retired    Leisure  puzzles, fishing      Cognition   Overall Cognitive Status  Within Functional Limits for tasks assessed      Ambulation/Gait   Gait Comments  amb independently in clinic without AD; will perform dynamic gait activities next session to further assess balance         Vestibular Assessment - 02/02/18 0940      Vestibular Assessment   General Observation  dizziness rated 5/10 currently      Symptom Behavior   Type of Dizziness  Lightheadedness occasional imbalance    Frequency of Dizziness  intermittent; very sporadic    Duration of Dizziness  "I can't explain."  reported it was constant x 2 1/2 months then resolved and has since returned    Aggravating Factors  Activity in general;Sit to stand    Relieving Factors  Lying supine;Rest       Occulomotor Exam   Occulomotor Alignment  Normal    Spontaneous  Absent    Gaze-induced  Absent    Smooth Pursuits  Intact    Saccades  Intact      Vestibulo-Occular Reflex   VOR 1 Head Only (x 1 viewing)  intact; increase in symptoms  Comment  HIT (-) with increase in symptoms      Positional Testing   Sidelying Test  Sidelying Right;Sidelying Left    Horizontal Canal Testing  Horizontal Canal Right;Horizontal Canal Left      Sidelying Right   Sidelying Right Duration  increase in lightheadedness, did not subside    Sidelying Right Symptoms  No nystagmus      Sidelying Left   Sidelying Left Duration  none    Sidelying Left Symptoms  No nystagmus      Horizontal Canal Right   Horizontal Canal Right Duration  none    Horizontal Canal Right Symptoms  Normal      Horizontal Canal Left   Horizontal Canal Left Duration  none    Horizontal Canal Left Symptoms  Normal      Positional Sensitivities   Sit to Supine  Lightheadedness    Supine to Sitting  Lightheadedness    Right Hallpike  Lightheadedness    Up from Right Hallpike  Lightheadedness    Up from Left Hallpike  Lightheadedness    Rolling Right  Lightheadedness    Rolling Left  No dizziness      Orthostatics   BP supine (x 5 minutes)  135/63    HR supine (x 5 minutes)  85    BP standing (after 1 minute)  103/46    HR standing (after 1 minute)  90    BP standing (after 3 minutes)  140/63    HR standing (after 3 minutes)  93         Objective measurements completed on examination: See above findings.      [facility] Adult PT Treatment/Exercise - 02/02/18 0936      Self-Care   Self-Care  Other Self-Care Comments    Other Self-Care Comments   orthostatic hypotension and PT recommendations; discussed other findings and POC             PT Education - 02/02/18 1013    Education provided  Yes    Education Details  see self care    Person(s) Educated  Patient    Methods  Explanation;Handout     Comprehension  Verbalized understanding       PT Short Term Goals - 02/02/18 1049      PT SHORT TERM GOAL #1   Title  perform 6MWT and FGA with LTGs to follow    Status  New    Target Date  02/16/18        PT Long Term Goals - 02/02/18 1049      PT LONG TERM GOAL #1   Title  independent with HEP    Status  New    Target Date  03/16/18      PT LONG TERM GOAL #2   Title  report 50% improvement in symptoms    Status  New    Target Date  03/16/18      PT LONG TERM GOAL #3   Title  additional LTGs to follow              Plan - 02/02/18 1043    Clinical Impression Statement  Pt is an 84 y/o female who presents to [place] for 3 month hx of dizziness; and increased fatigue and deconditioning since July 2018.  Pt positive for orthostatic hypotension today in clinic and advised her to discuss with MD as PT feels this is contributing to most of her symptoms.  Pt also demonstrates some motion sensitivity  as well likely contributing to her fatigue and deconditioning.  Will plan to see 1x/wk x 6 weeks to address motion sensitivities, but orthostatic hypotension will need to be addressed in order to help maximize symptoms.    History and Personal Factors relevant to plan of care:  DM, HTN, CHF, CAD, depression, polymyalgia rheumatica    Clinical Presentation  Evolving    Clinical Presentation due to:  variable BP    Clinical Decision Making  Moderate    Rehab Potential  Good    PT Frequency  1x / week    PT Duration  6 weeks    PT Treatment/Interventions  ADLs/Self Care Home Management;Canalith Repostioning;Cryotherapy;Electrical Stimulation;Moist Heat;Gait training;Stair training;Functional mobility training;Therapeutic activities;Therapeutic exercise;Balance training;Neuromuscular re-education;Patient/family education;Vestibular    PT Next Visit Plan  complete MSQ and give HEP to help with motion sensitivities, 6MWT and FGA    Consulted and Agree with Plan of Care  Patient        Patient will benefit from skilled therapeutic intervention in order to improve the following deficits and impairments:  Dizziness, Decreased balance, Decreased activity tolerance, Decreased endurance, Decreased mobility  Visit Diagnosis: Dizziness and giddiness - Plan: PT plan of care cert/re-cert  Unsteadiness on feet - Plan: PT plan of care cert/re-cert     Problem List Patient Active Problem List   Diagnosis Date Noted  . Chronic cough 10/30/2017  . Right knee pain 08/20/2017  . MDD (major depressive disorder), recurrent, severe, with psychosis ([place]) 11/30/2016  . Protein-calorie malnutrition, severe 11/30/2016  . Elevated alkaline phosphatase level 09/18/2016  . Mixed incontinence urge and stress 05/09/2016  . Hypertensive retinopathy of both eyes, grade 2 04/20/2015  . Lumbar spondylosis 06/16/2014  . Carpal tunnel syndrome 02/18/2014  . Right shoulder pain 02/02/2014  . Osteopenia 01/11/2013  . Routine adult health maintenance 01/11/2013  . Diverticulosis 09/18/2012  . Dyspnea on exertion 03/24/2012  . Fatigue 02/26/2012  . Allergic rhinitis 02/13/2012  . Coronary artery disease 08/29/2011  . HEART MURMUR, SYSTOLIC 24/23/5361  . Enlargement of clavicle 03/30/2008  . Solitary pulmonary nodule 12/15/2006  . Hyperlipidemia 11/28/2006  . Hypothyroidism 09/04/2006  . Type 2 diabetes mellitus, controlled ([place]) 09/04/2006  . Migraine 09/04/2006  . Essential hypertension 09/04/2006  . GERD 09/04/2006      Laureen Abrahams, PT, DPT 02/02/18 10:52 AM    [facility] [address] [place], [place], [address] Phone: [phone]   Fax:  [phone]  Name: Christine [name] MRN: [id number] Date of Birth: 01/24/1934

## 2018-02-09 ENCOUNTER — Ambulatory Visit: Payer: Medicare Other

## 2018-02-13 ENCOUNTER — Ambulatory Visit (INDEPENDENT_AMBULATORY_CARE_PROVIDER_SITE_OTHER): Payer: Medicare Other | Admitting: Internal Medicine

## 2018-02-13 ENCOUNTER — Encounter: Payer: Self-pay | Admitting: Internal Medicine

## 2018-02-13 ENCOUNTER — Other Ambulatory Visit: Payer: Self-pay

## 2018-02-13 DIAGNOSIS — E119 Type 2 diabetes mellitus without complications: Secondary | ICD-10-CM

## 2018-02-13 DIAGNOSIS — Z7902 Long term (current) use of antithrombotics/antiplatelets: Secondary | ICD-10-CM

## 2018-02-13 DIAGNOSIS — I951 Orthostatic hypotension: Secondary | ICD-10-CM | POA: Diagnosis not present

## 2018-02-13 DIAGNOSIS — R011 Cardiac murmur, unspecified: Secondary | ICD-10-CM

## 2018-02-13 DIAGNOSIS — E785 Hyperlipidemia, unspecified: Secondary | ICD-10-CM

## 2018-02-13 DIAGNOSIS — I10 Essential (primary) hypertension: Secondary | ICD-10-CM | POA: Diagnosis not present

## 2018-02-13 DIAGNOSIS — Z79899 Other long term (current) drug therapy: Secondary | ICD-10-CM

## 2018-02-13 MED ORDER — ATORVASTATIN CALCIUM 40 MG PO TABS: 20.0000 mg | ORAL_TABLET | Freq: Every evening | ORAL | 1 refills | Status: DC

## 2018-02-13 NOTE — Assessment & Plan Note (Signed)
BP Readings from Last 3 Encounters:  01/14/18 (!) 156/57  11/26/17 (!) 154/54  11/20/17 139/67   Orthostatic VS for the past 24 hrs:  BP- Lying Pulse- Lying BP- Sitting Pulse- Sitting BP- Standing at 0 minutes Pulse- Standing at 0 minutes  02/13/18 0942 - - 142/61 79 121/62 82  02/13/18 0937 152/59 83 - - - -   She was having little elevated blood pressure on presentation with positive orthostatic vitals which include drop of blood pressure from 150-121 from lying to standing and symptoms of being dizzy.  Physical therapy sent in order to address her postural drop in blood pressure to get more benefit with physical therapy.  -We will stop amlodipine today. -Follow-up in 2 weeks for blood pressure check. -Patient was instructed to check her blood pressure at home if it remains above 180 she should contact clinic and restart amlodipine. -She was also instructed to keep a log of her dizziness including time and activity and bring that log with her during next follow-up visit. -She is not taking mirtazapine anymore-no difference in her dizziness. -I kept  amlodipine in her medication list, as we are stopping it temporarily.

## 2018-02-13 NOTE — Assessment & Plan Note (Signed)
She was asking for a refill on her Lipitor which was provided.

## 2018-02-13 NOTE — Patient Instructions (Signed)
Thank you for visiting clinic today. Please stop taking amlodipine and see if that will improve your dizziness. Please keep a log of your dizziness with time and activity you are doing and bring that log with you during next follow-up appointment in 2 weeks.

## 2018-02-13 NOTE — Progress Notes (Signed)
Internal Medicine Clinic Attending  Case discussed with Dr. Reesa Chew at the time of the visit.  We reviewed the resident's history and exam and pertinent patient test results.  I agree with the assessment, diagnosis, and plan of care documented in the resident's note. I am not convinced that her dizziness is all due to orthostatic hypotension as she was symptomatic at [facility] appt when her orthostatics were negative. Start with stopping amlodipine. If no improvement, start tapering centrally acting meds.

## 2018-02-13 NOTE — Assessment & Plan Note (Signed)
She to check her blood sugar most of the days and according to patient it remains between 130-140s-fasting. She never brought her glucometer to the clinic today.  Continue current management.

## 2018-02-13 NOTE — Progress Notes (Signed)
CC: To follow-up of her hypertension and dizziness.  HPI:  Christine [name] is a 84 y.o. with past medical history as listed below came to the clinic to follow-up of her hypertension and dizziness.  Patient continued to become dizzy mostly with postural change.  She was recently evaluated for physical therapy and found to have positive orthostatic vitals.  She was also having positive orthostatic vitals during current visit today.  She was quite symptomatic especially from sitting to standing.  She has an extensive workup done in the past with not much conclusive evidence.  According to patient she keep herself well-hydrated and drink at least 8 glasses of water during the day. According to patient there were a few days 2 weeks ago when she was not dizzy at all. Her dyspnea on exertion is improving. She had her earwax removed. She continued to get intermittent ringing in her ears which is going on for many years now.  She denies any room spinning or vertigo symptoms.  She is more lightheaded. No associated nausea or vomiting.  Please see assessment and plan for her chronic conditions.  Past Medical History:  Diagnosis Date  . Anemia   . Anginal pain ([place])   . Arthritis    "back, arms, hips; hands" (11/30/2015)  . Benign hypertensive heart and kidney disease with diastolic CHF, NYHA class II and CKD stage III ([place])   . Blood transfusion 1972   "after daughter born, attempted to give me blood; couldn't give it cause my blood was cold" (09/23/2013)  . CAD (coronary artery disease)    a. LHC 08/23/11: dLM 40-50%, oRI 40%, oCFX 40%, oD1 70% (small and not amenable to PCI).  LM lesion did not appear to be flow limiting.  Medical rx was recommended;  b. Echo 08/23/11: mild LVH, EF 60-65%, grade 1 diast dysfxn, mild BAE, PASP 24;  c. 02/2012  Cath: LM 50d, LAD 50p, D1 70ost, RI 70p, RCA ok->Med Rx;  d. 01/2013 Cardiolite: EF 88, no ischemia/infarct.  . Carotid stenosis    a. dopplers 10/12:  0-39%  bilat ICA;  b. 10/2012 U/S: 0-39% bilat, f/u 1 yr (10/2013).  . Chronic bronchitis   . Depression   . Diverticulosis of colon (without mention of hemorrhage)   . GERD (gastroesophageal reflux disease)   . Heart murmur, systolic    2-D echo in December 2011 showed a normal EF with grade 1 diastolic dysfunction, trivial pulmonary regurgitation and mildly elevated PA pressure at 37 mmHg probably secondary to her COPD.dynamic obstruction-mid cavity obliteration;  b. 02/2012 Echo: EF 60-65%, mild LVH, PASP 48mmHg.  Marland Kitchen History of stomach ulcers 1970's  . Hyperlipidemia   . Hypertension   . Hypothyroidism   . Migraines    a. Next atypical symptoms in the past. Patient was started on Neurontin for possible neuropathic origin of her pain.  . Polymyalgia rheumatica ([place])   . Shortness of breath on exertion    "just related to angina >1 yr ago" (09/23/2013)  . Sinus arrhythmia   . Type II diabetes mellitus ([place])    a. On oral hypoglycemic agents.   Review of Systems: Negative except mentioned in HPI.  Physical Exam:  Vitals:   02/13/18 0934  Temp: 97.8 F (36.6 C)  SpO2: 100%  Weight: 138 lb 6.4 oz (62.8 kg)  Height: 5\' 4"  (1.626 m)   Orthostatic VS for the past 24 hrs:  BP- Lying Pulse- Lying BP- Sitting Pulse- Sitting BP- Standing at 0  minutes Pulse- Standing at 0 minutes  02/13/18 0942 - - 142/61 79 121/62 82  02/13/18 0937 152/59 83 - - - -    General: Vital signs reviewed.  Patient is well-developed and well-nourished, in no acute distress and cooperative with exam.  Head: Normocephalic and atraumatic. Eyes: EOMI, conjunctivae normal, no scleral icterus.  Cardiovascular: RRR, S1 normal, S2 normal, systolic murmur at right second intercostal space. Pulmonary/Chest: Clear to auscultation bilaterally, no wheezes, rales, or rhonchi. Abdominal: Soft, non-tender, non-distended, BS +, no masses, organomegaly, or guarding present.  Extremities: No lower extremity edema bilaterally,  pulses  symmetric and intact bilaterally. No cyanosis or clubbing. Neurological: A&O x3, Strength is normal and symmetric bilaterally, cranial nerve II-XII are grossly intact, no focal motor deficit, sensory intact to light touch bilaterally.  Skin: Warm, dry and intact. No rashes or erythema. Psychiatric: Normal mood and affect. speech and behavior is normal. Cognition and memory are normal.  Assessment & Plan:   See Encounters Tab for problem based charting.  Patient discussed with Dr. Lynnae January.

## 2018-02-17 ENCOUNTER — Other Ambulatory Visit: Payer: Self-pay | Admitting: Internal Medicine

## 2018-02-17 DIAGNOSIS — F333 Major depressive disorder, recurrent, severe with psychotic symptoms: Secondary | ICD-10-CM

## 2018-02-18 ENCOUNTER — Telehealth: Payer: Self-pay | Admitting: Internal Medicine

## 2018-02-18 ENCOUNTER — Ambulatory Visit: Payer: Medicare Other | Admitting: Physical Therapy

## 2018-02-18 NOTE — Telephone Encounter (Signed)
Patient is calling about her blood pressure medicine, Dr Reesa Chew took her off one of her medicine, her head been hurting really bad, she check her blood pressure and it was 189/78. Can someone please call patient?

## 2018-02-19 ENCOUNTER — Telehealth: Payer: Self-pay | Admitting: *Deleted

## 2018-02-19 NOTE — Telephone Encounter (Signed)
Reviewed Agree with your assessment and recommendations DrG

## 2018-02-19 NOTE — Telephone Encounter (Signed)
Per another telephone note - pt has an appt schedule  tomorrow in [facility]

## 2018-02-19 NOTE — Telephone Encounter (Signed)
Called pt to see how her BP is today- no answer; left message to call the office. I will send message to Dr Reesa Chew who last saw pt.

## 2018-02-19 NOTE — Telephone Encounter (Signed)
She was instructed to restart amlodipine if her blood pressure remained high.

## 2018-02-19 NOTE — Telephone Encounter (Signed)
Patient called in c/o "terrible headaches every morning when I wake up." Patient has been taking tylenol to relieve pain. HAs began after stopping amlodipine as instructed at [facility] on 02/13/2018. States she's still having dizziness and has been keeping log of BP and episodes of dizziness. Offered appt today but she is unable to come as she has a dental appt this afternoon. Appt in [facility] scheduled for tomorrow at 0915; will bring log. Patient instructed to go to ED if pain becomes unbearable. States she will. Hubbard Hartshorn, RN, BSN

## 2018-02-19 NOTE — Telephone Encounter (Signed)
Noted thx DrG 

## 2018-02-20 ENCOUNTER — Encounter: Payer: Self-pay | Admitting: Internal Medicine

## 2018-02-20 ENCOUNTER — Ambulatory Visit (INDEPENDENT_AMBULATORY_CARE_PROVIDER_SITE_OTHER): Payer: Medicare Other | Admitting: Internal Medicine

## 2018-02-20 VITALS — BP 148/53 | HR 90 | Temp 98.0°F | Ht 64.0 in | Wt 137.0 lb

## 2018-02-20 DIAGNOSIS — R519 Headache, unspecified: Secondary | ICD-10-CM | POA: Insufficient documentation

## 2018-02-20 DIAGNOSIS — K5790 Diverticulosis of intestine, part unspecified, without perforation or abscess without bleeding: Secondary | ICD-10-CM

## 2018-02-20 DIAGNOSIS — H9313 Tinnitus, bilateral: Secondary | ICD-10-CM | POA: Diagnosis not present

## 2018-02-20 DIAGNOSIS — E039 Hypothyroidism, unspecified: Secondary | ICD-10-CM | POA: Diagnosis not present

## 2018-02-20 DIAGNOSIS — E119 Type 2 diabetes mellitus without complications: Secondary | ICD-10-CM | POA: Diagnosis not present

## 2018-02-20 DIAGNOSIS — Z8669 Personal history of other diseases of the nervous system and sense organs: Secondary | ICD-10-CM

## 2018-02-20 DIAGNOSIS — I2510 Atherosclerotic heart disease of native coronary artery without angina pectoris: Secondary | ICD-10-CM

## 2018-02-20 DIAGNOSIS — R51 Headache: Secondary | ICD-10-CM

## 2018-02-20 DIAGNOSIS — G43809 Other migraine, not intractable, without status migrainosus: Secondary | ICD-10-CM

## 2018-02-20 DIAGNOSIS — R011 Cardiac murmur, unspecified: Secondary | ICD-10-CM

## 2018-02-20 DIAGNOSIS — Z79890 Hormone replacement therapy (postmenopausal): Secondary | ICD-10-CM | POA: Diagnosis not present

## 2018-02-20 DIAGNOSIS — I10 Essential (primary) hypertension: Secondary | ICD-10-CM | POA: Diagnosis not present

## 2018-02-20 DIAGNOSIS — Z79899 Other long term (current) drug therapy: Secondary | ICD-10-CM | POA: Diagnosis not present

## 2018-02-20 DIAGNOSIS — F333 Major depressive disorder, recurrent, severe with psychotic symptoms: Secondary | ICD-10-CM

## 2018-02-20 MED ORDER — LEVOTHYROXINE SODIUM 25 MCG PO TABS: 25.0000 ug | ORAL_TABLET | Freq: Every day | ORAL | 1 refills | Status: DC

## 2018-02-20 MED ORDER — IMIPRAMINE HCL 50 MG PO TABS: 50.0000 mg | ORAL_TABLET | Freq: Two times a day (BID) | ORAL | 1 refills | Status: DC

## 2018-02-20 NOTE — Patient Instructions (Signed)
FOLLOW-UP INSTRUCTIONS When: 2 months with PCP For: BP management and health maintenance What to bring: medications, BP log, glucometer    Christine [name],  It was a pleasure to meet you today.  I think your headaches may be coming from your high blood pressure. Please continue to take your amlodipine 10mg  daily.  Please schedule an appointment for 2-3 months with your primary doctor.

## 2018-02-20 NOTE — Assessment & Plan Note (Signed)
Stable - Refilled levothyroxine

## 2018-02-20 NOTE — Assessment & Plan Note (Signed)
Assessment Amlodipine held at previous visit due to concern for symptoms of dizziness. In the following few days of holding her amlodipine, her BP was elevated and she started having headaches. She restarted the amlodipine and as her BP came down, her headache eventually resolved. No focal neuro s/s and no associated symptoms. No changes in her diet, caffeine intake, or other medications. Her headache has now resolved after restarting her amlodipine.  Plan - Restarted amlodipine 10mg  daily - Return in 2 months

## 2018-02-20 NOTE — Progress Notes (Signed)
CC: management of HTN and HA  HPI:  [name] is a 84 y.o. female with PMH of nonobstructive CAD, HTN, diabetes, and hx of migraines who presents for management of HTN and HA.  She was seen in clinic on 3/29 for complaints of dizziness. She was instructed to hold her amlodipine. She states that she stopped her amlodipine that Friday, as instructed and after 2 days, she woke up with a terrible headache. She is not sure whether the pain woke her up from sleep. She restarted her amlodipine the following morning and continued to have the headache for 3 more days. She states it was constant but tylenol did relieve the pain. The headache is frontal and on the top of her head. Not associated with blurry vision, photophobia, phonophobia, or N/V. The headache lasted until this morning, but she reports relief from the headache now.  BP log at home:  Mon-Thurs (on amlodipine) - 130s-140s/60s-70s Fri (stopped amlodipine from last office visit) - 139/70 Sat (off amlodipine) - 131/96 in PM Sun (off amlodipine) - 173/79 in AM, 165/80 in PM Mon (off amlodipine) - 165/81 Tues (restarted amlodipine) Wed (on amlodipine) - 189/78 in AM, 153/69 in PM Thurs (on amlodipine) - 168/87 in AM, 138/78 in PM  She denies any other changes in her diet, caffeine intake, or medications. She does not smoke, no cough, SOB, dysuria, bloody stool. She does have chronic bilateral tinnitus but no change in hearing. Feels less dizzy now. Denies unsteadiness, night sweats, or unintentional weight loss.  MRI in 11/2016 was normal for age, no lesion noted TSH normal in 08/2017 Colonoscopy in 2009 with diverticulosis, no polyps Mammogram in 2016 negative  FHx: grandmother with colon cancer in 48s  Past Medical History:  Diagnosis Date  . Anemia   . Anginal pain ([place])   . Arthritis    "back, arms, hips; hands" (11/30/2015)  . Benign hypertensive heart and kidney disease with diastolic CHF, NYHA class II and CKD stage  III ([place])   . Blood transfusion 1972   "after daughter born, attempted to give me blood; couldn't give it cause my blood was cold" (09/23/2013)  . CAD (coronary artery disease)    a. LHC 08/23/11: dLM 40-50%, oRI 40%, oCFX 40%, oD1 70% (small and not amenable to PCI).  LM lesion did not appear to be flow limiting.  Medical rx was recommended;  b. Echo 08/23/11: mild LVH, EF 60-65%, grade 1 diast dysfxn, mild BAE, PASP 24;  c. 02/2012  Cath: LM 50d, LAD 50p, D1 70ost, RI 70p, RCA ok->Med Rx;  d. 01/2013 Cardiolite: EF 88, no ischemia/infarct.  . Carotid stenosis    a. dopplers 10/12:  0-39% bilat ICA;  b. 10/2012 U/S: 0-39% bilat, f/u 1 yr (10/2013).  . Chronic bronchitis   . Depression   . Diverticulosis of colon (without mention of hemorrhage)   . GERD (gastroesophageal reflux disease)   . Heart murmur, systolic    2-D echo in December 2011 showed a normal EF with grade 1 diastolic dysfunction, trivial pulmonary regurgitation and mildly elevated PA pressure at 37 mmHg probably secondary to her COPD.dynamic obstruction-mid cavity obliteration;  b. 02/2012 Echo: EF 60-65%, mild LVH, PASP 74mmHg.  Marland Kitchen History of stomach ulcers 1970's  . Hyperlipidemia   . Hypertension   . Hypothyroidism   . Migraines    a. Next atypical symptoms in the past. Patient was started on Neurontin for possible neuropathic origin of her pain.  . Polymyalgia rheumatica (  [place])   . Shortness of breath on exertion    "just related to angina >1 yr ago" (09/23/2013)  . Sinus arrhythmia   . Type II diabetes mellitus ([place])    a. On oral hypoglycemic agents.   Review of Systems:   GEN: Negative for night sweats or fevers NEURO: Positive for headaches that have now resolved. Negative for focal neuro weakness or numbness CV: Negative for chest pain PULM: Negative for SOB or cough ABD: Negative for abdominal pain, N/V, hematochezia, or melena GU: Negative for dysuria EXT: Negative for LE edema  Physical Exam:  Vitals:    02/20/18 0931  BP: (!) 148/53  Pulse: 90  Temp: 98 F (36.7 C)  TempSrc: Oral  SpO2: 100%  Weight: 137 lb (62.1 kg)  Height: 5\' 4"  (1.626 m)   GEN: Sitting in chair in NAD; appears younger than stated age NEURO: 5/5 strength in UE and LE. Normal sensation in UE and LE. CN II-XII intact (except for mild hearing loss bilaterally) HENT: PERRL. MMM NECK: No carotid bruits, no LAD CV: NR & RR, Systolic murmur best heard at RUSB, no r/g PULM: CTAB, no wheezes or rales EXT: No LE edema  Assessment & Plan:   See Encounters Tab for problem based charting.  Patient discussed with Dr. Angelia Mould

## 2018-02-20 NOTE — Assessment & Plan Note (Signed)
Assessment Hx of migraines, on imipramine for prevention. She reports her current headaches are not similar to her previous migraines. No associated photophobia, phonophobia, blurry vision, or nausea.  Plan - Refilled imipramine

## 2018-02-20 NOTE — Assessment & Plan Note (Addendum)
Assessment 4 day constant throbbing headache without focal neuro s/s in the setting of stopping amlodipine and subsequently elevated BP with sBP in 170s-180s. Somewhat relieved by tylenol. The headaches have now resolved after re-initiation of amlodipine and better control of BP. No other changes in diet, medications, or caffeine intake. TSH in 2018, colonoscopy in 2009, mammogram in 2016, and MRI in 2018 were normal.  Headaches most likely related to elevated BP in the setting of holding amlodipine. Alternatively, could be tension-type. Less likely migraine or intracranial mass.  Plan - Restart amlodipine 10mg  daily - Heating pad for 18min to back of neck q4h PRN - F/u with PCP in 2-3 months

## 2018-02-23 NOTE — Progress Notes (Signed)
Internal Medicine Clinic Attending  Case discussed with Dr. Huang at the time of the visit.  We reviewed the resident's history and exam and pertinent patient test results.  I agree with the assessment, diagnosis, and plan of care documented in the resident's note. 

## 2018-02-26 ENCOUNTER — Telehealth: Payer: Self-pay | Admitting: Internal Medicine

## 2018-02-26 NOTE — Telephone Encounter (Signed)
Patient is requesting refills on test strips. The pharmacy told her that they been trying for 3days

## 2018-02-27 ENCOUNTER — Ambulatory Visit (INDEPENDENT_AMBULATORY_CARE_PROVIDER_SITE_OTHER): Payer: Medicare Other | Admitting: Internal Medicine

## 2018-02-27 ENCOUNTER — Ambulatory Visit: Payer: Self-pay

## 2018-02-27 VITALS — BP 133/60 | HR 82 | Temp 98.0°F | Wt 135.5 lb

## 2018-02-27 DIAGNOSIS — E119 Type 2 diabetes mellitus without complications: Secondary | ICD-10-CM | POA: Diagnosis not present

## 2018-02-27 DIAGNOSIS — Z79899 Other long term (current) drug therapy: Secondary | ICD-10-CM

## 2018-02-27 DIAGNOSIS — Z8679 Personal history of other diseases of the circulatory system: Secondary | ICD-10-CM | POA: Diagnosis not present

## 2018-02-27 DIAGNOSIS — R519 Headache, unspecified: Secondary | ICD-10-CM

## 2018-02-27 DIAGNOSIS — R51 Headache: Secondary | ICD-10-CM | POA: Diagnosis present

## 2018-02-27 DIAGNOSIS — Z8669 Personal history of other diseases of the nervous system and sense organs: Secondary | ICD-10-CM

## 2018-02-27 MED ORDER — ONETOUCH ULTRA BLUE VI STRP: 1.0000 | ORAL_STRIP | Freq: Two times a day (BID) | 2 refills | Status: DC

## 2018-02-27 NOTE — Assessment & Plan Note (Addendum)
Patient is here with complaint of headache that occurred yesterday and has now resolved.  She is concerned because she was previously told that she had a diagnosis of giant cell temporal arteritis instructed to see a provider immediately if she developed temporal headaches.  She does have a history of chronic migraines, however the headache yesterday was different than her normal migraines.  She denies vision change or loss of vision.  She reports she had tenderness over her right temporal region, with right periorbital headache.  She took a Tylenol and the headache resolved.  Is also continued to take her twice daily imipramine which she believes is helping with her migraines.  On chart review, it appears that she has a very questionable history of polymyalgia rheumatica.  She has been seen by multiple rheumatologist who were not convinced or in agreement with this diagnosis.  Neurologic exam today and visual acuity are intact.  I reassured patient.  She is very anxious about what to do if headache returns, she is very nervous about losing her vision.  Instructed patient if her headache returns or she loses vision, to call our after hours number to speak with the on-call provider who presented to the ED for evaluation.  --Low suspicion for PMR / GCA --Pain is resolved at this time -Provided reassurance supportive care

## 2018-02-27 NOTE — Patient Instructions (Addendum)
Christine [name],   It was a pleasure to meet you. I am sorry to hear about your headaches. Please continue to take your medicine as previously prescribed. I am glad you are feeling better. I have sent refills of your test strips to your pharmacy. If you have any questions or concerns, call our clinic at [phone] or after hours call [phone] and ask for the internal medicine resident on call. Thank you!  - Dr. Philipp Ovens

## 2018-02-27 NOTE — Telephone Encounter (Signed)
Pt's here for an appt -states she uses [facility].States she will ask doctor seeing her to refill test strips.

## 2018-02-27 NOTE — Assessment & Plan Note (Signed)
Refilled test strips.

## 2018-02-27 NOTE — Progress Notes (Signed)
   CC: Headache  HPI:  [name] is a 84 y.o. female with past medical history outlined below here for headache. For the details of today's visit, please refer to the assessment and plan.  Past Medical History:  Diagnosis Date  . Anemia   . Anginal pain ([place])   . Arthritis    "back, arms, hips; hands" (11/30/2015)  . Benign hypertensive heart and kidney disease with diastolic CHF, NYHA class II and CKD stage III ([place])   . Blood transfusion 1972   "after daughter born, attempted to give me blood; couldn't give it cause my blood was cold" (09/23/2013)  . CAD (coronary artery disease)    a. LHC 08/23/11: dLM 40-50%, oRI 40%, oCFX 40%, oD1 70% (small and not amenable to PCI).  LM lesion did not appear to be flow limiting.  Medical rx was recommended;  b. Echo 08/23/11: mild LVH, EF 60-65%, grade 1 diast dysfxn, mild BAE, PASP 24;  c. 02/2012  Cath: LM 50d, LAD 50p, D1 70ost, RI 70p, RCA ok->Med Rx;  d. 01/2013 Cardiolite: EF 88, no ischemia/infarct.  . Carotid stenosis    a. dopplers 10/12:  0-39% bilat ICA;  b. 10/2012 U/S: 0-39% bilat, f/u 1 yr (10/2013).  . Chronic bronchitis   . Depression   . Diverticulosis of colon (without mention of hemorrhage)   . GERD (gastroesophageal reflux disease)   . Heart murmur, systolic    2-D echo in December 2011 showed a normal EF with grade 1 diastolic dysfunction, trivial pulmonary regurgitation and mildly elevated PA pressure at 37 mmHg probably secondary to her COPD.dynamic obstruction-mid cavity obliteration;  b. 02/2012 Echo: EF 60-65%, mild LVH, PASP 32mmHg.  Marland Kitchen History of stomach ulcers 1970's  . Hyperlipidemia   . Hypertension   . Hypothyroidism   . Migraines    a. Next atypical symptoms in the past. Patient was started on Neurontin for possible neuropathic origin of her pain.  . Polymyalgia rheumatica ([place])   . Shortness of breath on exertion    "just related to angina >1 yr ago" (09/23/2013)  . Sinus arrhythmia   . Type II diabetes  mellitus ([place])    a. On oral hypoglycemic agents.    Review of Systems  Eyes: Negative for blurred vision.  Neurological: Positive for headaches.    Physical Exam:  Vitals:   02/27/18 1417  BP: 133/60  Pulse: 82  Temp: 98 F (36.7 C)  TempSrc: Oral  SpO2: 100%  Weight: 135 lb 8 oz (61.5 kg)    Constitutional: NAD, appears comfortable Cardiovascular: RRR, no murmurs, rubs, or gallops.  Pulmonary/Chest: CTAB, no wheezes, rales, or rhonchi. Extremities: Warm and well perfused. Distal pulses intact. No edema.  Neurological: PERRL, EOMI.  A&Ox3, CN II - XII grossly intact. No tenderness over temporal arteries, pulses intact.  Psychiatric: Normal mood and affect  Assessment & Plan:   See Encounters Tab for problem based charting.  Patient discussed with Dr. Rebeca Alert

## 2018-02-27 NOTE — Telephone Encounter (Signed)
Called pt - no answer; left message to call back to let us know the name of the test strips.

## 2018-03-02 ENCOUNTER — Telehealth: Payer: Self-pay | Admitting: Internal Medicine

## 2018-03-02 NOTE — Telephone Encounter (Signed)
Request for test strips refilled by Dr Philipp Ovens.

## 2018-03-03 ENCOUNTER — Other Ambulatory Visit: Payer: Self-pay | Admitting: Internal Medicine

## 2018-03-04 ENCOUNTER — Ambulatory Visit: Payer: Self-pay

## 2018-03-04 ENCOUNTER — Ambulatory Visit: Payer: Medicare Other | Admitting: Physical Therapy

## 2018-03-07 ENCOUNTER — Encounter (HOSPITAL_COMMUNITY): Payer: Self-pay

## 2018-03-07 ENCOUNTER — Emergency Department (HOSPITAL_COMMUNITY)
Admission: EM | Admit: 2018-03-07 | Discharge: 2018-03-08 | Disposition: A | Discharge status: Home / Self Care | Payer: Medicare Other | Attending: Emergency Medicine | Admitting: Emergency Medicine

## 2018-03-07 ENCOUNTER — Telehealth: Payer: Self-pay | Admitting: Internal Medicine

## 2018-03-07 ENCOUNTER — Other Ambulatory Visit: Payer: Self-pay

## 2018-03-07 ENCOUNTER — Emergency Department (HOSPITAL_COMMUNITY): Payer: Medicare Other

## 2018-03-07 DIAGNOSIS — Z7901 Long term (current) use of anticoagulants: Secondary | ICD-10-CM | POA: Diagnosis not present

## 2018-03-07 DIAGNOSIS — N183 Chronic kidney disease, stage 3 (moderate): Secondary | ICD-10-CM | POA: Insufficient documentation

## 2018-03-07 DIAGNOSIS — Z79899 Other long term (current) drug therapy: Secondary | ICD-10-CM | POA: Insufficient documentation

## 2018-03-07 DIAGNOSIS — I2510 Atherosclerotic heart disease of native coronary artery without angina pectoris: Secondary | ICD-10-CM | POA: Diagnosis not present

## 2018-03-07 DIAGNOSIS — R51 Headache: Secondary | ICD-10-CM | POA: Diagnosis not present

## 2018-03-07 DIAGNOSIS — E039 Hypothyroidism, unspecified: Secondary | ICD-10-CM | POA: Insufficient documentation

## 2018-03-07 DIAGNOSIS — Z7984 Long term (current) use of oral hypoglycemic drugs: Secondary | ICD-10-CM | POA: Insufficient documentation

## 2018-03-07 DIAGNOSIS — G43909 Migraine, unspecified, not intractable, without status migrainosus: Secondary | ICD-10-CM | POA: Diagnosis not present

## 2018-03-07 DIAGNOSIS — E119 Type 2 diabetes mellitus without complications: Secondary | ICD-10-CM | POA: Insufficient documentation

## 2018-03-07 DIAGNOSIS — I129 Hypertensive chronic kidney disease with stage 1 through stage 4 chronic kidney disease, or unspecified chronic kidney disease: Secondary | ICD-10-CM | POA: Insufficient documentation

## 2018-03-07 DIAGNOSIS — R519 Headache, unspecified: Secondary | ICD-10-CM

## 2018-03-07 LAB — BASIC METABOLIC PANEL
Anion gap: 12 (ref 5–15)
BUN: 21 mg/dL — AB (ref 6–20)
CO2: 21 mmol/L — AB (ref 22–32)
CREATININE: 1.19 mg/dL — AB (ref 0.44–1.00)
Calcium: 9.5 mg/dL (ref 8.9–10.3)
Chloride: 101 mmol/L (ref 101–111)
GFR calc non Af Amer: 41 mL/min — ABNORMAL LOW (ref >60)
GFR, EST AFRICAN AMERICAN: 48 mL/min — AB (ref >60)
GLUCOSE: 103 mg/dL — AB (ref 65–99)
Potassium: 4.7 mmol/L (ref 3.5–5.1)
Sodium: 134 mmol/L — ABNORMAL LOW (ref 135–145)

## 2018-03-07 LAB — CBC
HCT: 37.7 % (ref 36.0–46.0)
Hemoglobin: 12 g/dL (ref 12.0–15.0)
MCH: 28.2 pg (ref 26.0–34.0)
MCHC: 31.8 g/dL (ref 30.0–36.0)
MCV: 88.7 fL (ref 78.0–100.0)
PLATELETS: 373 10*3/uL (ref 150–400)
RBC: 4.25 MIL/uL (ref 3.87–5.11)
RDW: 14.5 % (ref 11.5–15.5)
WBC: 8.1 10*3/uL (ref 4.0–10.5)

## 2018-03-07 LAB — CBG MONITORING, ED: Glucose-Capillary: 100 mg/dL — ABNORMAL HIGH (ref 65–99)

## 2018-03-07 LAB — C-REACTIVE PROTEIN: CRP: 1.2 mg/dL — ABNORMAL HIGH (ref <1.0)

## 2018-03-07 MED ORDER — DIPHENHYDRAMINE HCL 50 MG/ML IJ SOLN
25.0000 mg | Freq: Once | INTRAMUSCULAR | Status: AC
  Administered 2018-03-07: 25 mg via INTRAVENOUS
  Filled 2018-03-07: qty 1

## 2018-03-07 MED ORDER — PROCHLORPERAZINE EDISYLATE 10 MG/2ML IJ SOLN
10.0000 mg | Freq: Once | INTRAMUSCULAR | Status: AC
  Administered 2018-03-07: 10 mg via INTRAVENOUS
  Filled 2018-03-07: qty 2

## 2018-03-07 NOTE — ED Provider Notes (Signed)
Patient placed in Quick Look pathway, seen and evaluated   Chief Complaint: headache right side, change in speech  HPI:   Christine [name] is a 84 y.o. female here for headache. The patient's daughter reports that the patient called her today stating that she had increased pain in the right side of her head. Patient has been evaluated in the Internal Medicine Clinic for headaches and has been started back on her BP medication. Today after arrival to the ED the patient's daughter states that the patient's speech has changed just since she has been her. The speech is not slurred but is much slower and quitter than normal.    ROS: Neuro: headache, speech change  Physical Exam:  BP (!) 154/64   Pulse 85   Temp 98.4 F (36.9 C) (Oral)   Resp 16   Ht 5\' 4"  (1.626 m)   Wt 61.7 kg (136 lb)   LMP 02/11/1975   SpO2 100%   BMI 23.34 kg/m    Gen: No distress  Neuro: Awake and Alert  Skin: Warm and dry  Neuro: grips equal, radial pulses 2+, no pronator drift. No facial droop.    During exam patient's BP increased to 198/92.    Initiation of care has begun. The patient has been counseled on the process, plan, and necessity for staying for the completion/evaluation, and the remainder of the medical screening examination    Ashley Murrain, NP 03/07/18 1854    Macarthur Critchley, MD 03/07/18 [phone]

## 2018-03-07 NOTE — ED Notes (Signed)
Lab called; gold tube sent down for CRP, states they will make sed rate an add on to previous collection.

## 2018-03-07 NOTE — ED Notes (Signed)
Patient transported to CT, CT will bring pt to room after.

## 2018-03-07 NOTE — Telephone Encounter (Signed)
   Reason for call:   I received a call from [facility], the daughter of Ms. [name] at 6:00 PM indicating that Ms. Lemaire is having a headache and they are in the emergency department.   Pertinent Data:   On the 44 th Ms. Pung began having a temporal headache, the headache fluctuates in intensity but is constant, she has had trouble sleeping but she does not feel that this is necessary due to the pain, she has not had changes in vision such as floaters, the pain is associated with "just not feeling well", decreased energy (she has not been getting out of bed), she denies symptoms of jaw claudication or fever   She has a history of migraines however this is nothing like the migraines that she has had in the past, no sensitivity to light and sound  Today she began having a stabbing pain in bilateral eyes around 4:30 pm which has changed to a sharp pain shooting across her right eye and temple    Daughter is concerned because patient has a history of polymyalgia rheumatica diagnosed 10 years ago and Ms. Cashin was told that she had temporal arteritis for the headache that she had at that time which worked to relieve the pain. She was on the prednisone for months with a taper. She has not been on prednisone for the past 5 years but she was on prednisone for a brief prior.   Daughter also notes it has been difficult for Ms. Genrich to get out of bed, is having shoulder pain, knee pain and scalp pain   Has been taking extra strength tylenol 1000 mg TID for the past week which has helped to improve but not relieve the pain and the pain returns when the medication has worn off    Assessment / Plan / Recommendations:   Currently [name] and Ms. Holst are in the [facility] ED   I heard Ms. [place] in the background start saying that the time seemed to be slowing down then [facility] said that she needed to go find the ED provider   I will message our clinic front desk to call the family to schedule a follow  up appointment first thing Monday morning   As always, pt is advised that if symptoms worsen or new symptoms arise, they should go to an urgent care facility or to to ER for further evaluation.   Ledell Noss, MD   03/07/2018, 6:23 PM

## 2018-03-07 NOTE — ED Notes (Signed)
Pt brought back into triage room after daughter reports pt speaking slower than normal. Otherwise neuro intact.

## 2018-03-07 NOTE — ED Provider Notes (Signed)
[facility] EMERGENCY DEPARTMENT Provider Note   CSN: [id number] Arrival date & time: 03/07/18  1756     History   Chief Complaint Chief Complaint  Patient presents with  . Headache    HPI Christine [name] is a 84 y.o. female.  The history is provided by the patient, a relative and medical records.  Headache   This is a new problem. The current episode started more than 2 days ago. The problem occurs constantly. The problem has been gradually improving. The headache is associated with nothing. The pain is located in the temporal region. The quality of the pain is described as throbbing and dull. The pain is at a severity of 4/10. The pain is moderate. The pain does not radiate. Pertinent negatives include no fever, no chest pressure, no near-syncope, no palpitations, no syncope, no shortness of breath, no nausea and no vomiting. She has tried nothing for the symptoms. The treatment provided no relief.    Past Medical History:  Diagnosis Date  . Anemia   . Anginal pain ([place])   . Arthritis    "back, arms, hips; hands" (11/30/2015)  . Benign hypertensive heart and kidney disease with diastolic CHF, NYHA class II and CKD stage III ([place])   . Blood transfusion 1972   "after daughter born, attempted to give me blood; couldn't give it cause my blood was cold" (09/23/2013)  . CAD (coronary artery disease)    a. LHC 08/23/11: dLM 40-50%, oRI 40%, oCFX 40%, oD1 70% (small and not amenable to PCI).  LM lesion did not appear to be flow limiting.  Medical rx was recommended;  b. Echo 08/23/11: mild LVH, EF 60-65%, grade 1 diast dysfxn, mild BAE, PASP 24;  c. 02/2012  Cath: LM 50d, LAD 50p, D1 70ost, RI 70p, RCA ok->Med Rx;  d. 01/2013 Cardiolite: EF 88, no ischemia/infarct.  . Carotid stenosis    a. dopplers 10/12:  0-39% bilat ICA;  b. 10/2012 U/S: 0-39% bilat, f/u 1 yr (10/2013).  . Chronic bronchitis   . Depression   . Diverticulosis of colon (without mention of hemorrhage)   .  GERD (gastroesophageal reflux disease)   . Heart murmur, systolic    2-D echo in December 2011 showed a normal EF with grade 1 diastolic dysfunction, trivial pulmonary regurgitation and mildly elevated PA pressure at 37 mmHg probably secondary to her COPD.dynamic obstruction-mid cavity obliteration;  b. 02/2012 Echo: EF 60-65%, mild LVH, PASP 37mHg.  .Marland KitchenHistory of stomach ulcers 1970's  . Hyperlipidemia   . Hypertension   . Hypothyroidism   . Migraines    a. Next atypical symptoms in the past. Patient was started on Neurontin for possible neuropathic origin of her pain.  . Polymyalgia rheumatica ([place]   . Shortness of breath on exertion    "just related to angina >1 yr ago" (09/23/2013)  . Sinus arrhythmia   . Type II diabetes mellitus ([place]    a. On oral hypoglycemic agents.    Patient Active Problem List   Diagnosis Date Noted  . Headache 02/20/2018  . Chronic cough 10/30/2017  . Right knee pain 08/20/2017  . MDD (major depressive disorder), recurrent, severe, with psychosis ([place] 11/30/2016  . Protein-calorie malnutrition, severe 11/30/2016  . Elevated alkaline phosphatase level 09/18/2016  . Mixed incontinence urge and stress 05/09/2016  . Hypertensive retinopathy of both eyes, grade 2 04/20/2015  . Lumbar spondylosis 06/16/2014  . Carpal tunnel syndrome 02/18/2014  . Right shoulder pain 02/02/2014  .  Osteopenia 01/11/2013  . Routine adult health maintenance 01/11/2013  . Diverticulosis 09/18/2012  . Dyspnea on exertion 03/24/2012  . Fatigue 02/26/2012  . Allergic rhinitis 02/13/2012  . Coronary artery disease 08/29/2011  . HEART MURMUR, SYSTOLIC 64/40/3474  . Enlargement of clavicle 03/30/2008  . Solitary pulmonary nodule 12/15/2006  . Hyperlipidemia 11/28/2006  . Hypothyroidism 09/04/2006  . Type 2 diabetes mellitus, controlled ([place]) 09/04/2006  . Migraine 09/04/2006  . Essential hypertension 09/04/2006  . GERD 09/04/2006    Past Surgical History:  Procedure  Laterality Date  . CARDIAC CATHETERIZATION N/A 09/26/2015   Procedure: Right/Left Heart Cath and Coronary Angiography;  Surgeon: Larey Dresser, MD;  Location: [facility];  Service: Cardiovascular;  Laterality: N/A;  . CATARACT EXTRACTION W/ INTRAOCULAR LENS  IMPLANT, BILATERAL Bilateral 1990's  . LEFT HEART CATHETERIZATION WITH CORONARY ANGIOGRAM N/A 03/16/2012   Procedure: LEFT HEART CATHETERIZATION WITH CORONARY ANGIOGRAM;  Surgeon: Hillary Bow, MD;  Location[facility];  Service: Cardiovascular;  Laterality: N/A;  . RIGHT/LEFT HEART CATH AND CORONARY ANGIOGRAPHY N/A 08/12/2017   Procedure: RIGHT/LEFT HEART CATH AND CORONARY ANGIOGRAPHY;  Surgeon: Martinique, Peter M, MD;  Location: [facility];  Service: Cardiovascular;  Laterality: N/A;  . VAGINAL HYSTERECTOMY  1976     OB History   None      Home Medications    Prior to Admission medications   Medication Sig Start Date End Date Taking? Authorizing Provider  acetaminophen (TYLENOL) 500 MG tablet Take 1,000 mg by mouth every 6 (six) hours as needed for headache. For pain    [provider]  albuterol (PROVENTIL HFA;VENTOLIN HFA) 108 (90 Base) MCG/ACT inhaler Inhale 1-2 puffs into the lungs every 6 (six) hours as needed for wheezing or shortness of breath. 11/06/17   Thomasene Ripple, MD  amLODipine (NORVASC) 10 MG tablet take 1 tablet by mouth once daily 09/24/17   [name], MD  Ascorbic Acid (VITAMIN C PO) Take 1 tablet by mouth daily.     [provider]  atorvastatin (LIPITOR) 40 MG tablet Take 0.5 tablets (20 mg total) by mouth every evening. 02/13/18   Lorella Nimrod, MD  clopidogrel (PLAVIX) 75 MG tablet take 1 tablet by mouth once daily EVERY EVENING 10/20/17   [name], MD  esomeprazole (NEXIUM) 20 MG capsule Take 1 capsule (20 mg total) by mouth daily with breakfast. 11/06/17   Nedrud, Larena Glassman, MD  fluticasone (FLONASE) 50 MCG/ACT nasal spray Place 1 spray into both nostrils daily.  05/28/17   [name], MD  glimepiride (AMARYL) 1 MG tablet take 1 tablet by mouth once daily BEFORE BREAKFAST 10/03/17   Oda Kilts, MD  hydroxypropyl methylcellulose (ISOPTO TEARS) 2.5 % ophthalmic solution Place 1 drop into both eyes at bedtime.     [provider]  imipramine (TOFRANIL) 50 MG tablet Take 1 tablet (50 mg total) by mouth 2 (two) times daily. 02/20/18   Colbert Ewing, MD  irbesartan (AVAPRO) 150 MG tablet Take 1 tablet (150 mg total) by mouth daily. 01/14/18   [name], MD  levothyroxine (SYNTHROID, LEVOTHROID) 25 MCG tablet Take 1 tablet (25 mcg total) by mouth daily. 02/20/18   Colbert Ewing, MD  Multiple Vitamin (MULTIVITAMIN WITH MINERALS) TABS tablet Take 1 tablet by mouth daily. 11/29/16   Asencion Partridge, MD  nitroGLYCERIN (NITROSTAT) 0.4 MG SL tablet place 1 tablet under the tongue if needed every 5 minutes for chest pain for 3 doses IF NO  RELIEF AFTER FIRST DOSE CALL PRESCRIBER OR 911. 11/19/17   [name], MD  ONE TOUCH ULTRA TEST test strip 1 each by Other route 2 (two) times daily. 02/27/18   Velna Ochs, MD  polycarbophil (FIBERCON) 625 MG tablet Take 1 tablet (625 mg total) by mouth daily. 11/28/16   Asencion Partridge, MD  promethazine (PHENERGAN) 12.5 MG tablet take 1 tablet by mouth every 6 hours if needed for nausea and vomiting 08/20/17   [name], MD  senna (SENOKOT) 8.6 MG TABS tablet Take 1 tablet (8.6 mg total) by mouth at bedtime as needed for mild constipation. 11/28/16   Asencion Partridge, MD    Family History Family History  Problem Relation Age of Onset  . Colon cancer Maternal Grandmother   . Heart attack Maternal Grandmother   . COPD Father   . Other Mother        died of unknown causes in her 32's. Pt raised by grandmother.  . Pulmonary Hypertension Daughter     Social History Social History   Tobacco Use  . Smoking status: Never Smoker  . Smokeless tobacco: Never Used  Substance Use Topics  . Alcohol use:  No    Alcohol/week: 0.0 oz    Comment: 11/29/2016 "Last drink 1967"  . Drug use: No     Allergies   Aspirin; Atarax [hydroxyzine]; Nsaids; Codeine; Haldol [haloperidol]; Other; and Penicillins   Review of Systems Review of Systems  Constitutional: Negative for activity change, chills, diaphoresis, fatigue and fever.  HENT: Negative for congestion.   Eyes: Negative for photophobia and visual disturbance.  Respiratory: Negative for cough, chest tightness, shortness of breath and wheezing.   Cardiovascular: Negative for palpitations, syncope and near-syncope.  Gastrointestinal: Negative for nausea and vomiting.  Genitourinary: Negative for dysuria, flank pain and frequency.  Musculoskeletal: Negative for back pain and neck pain.  Neurological: Positive for headaches. Negative for dizziness, facial asymmetry, weakness, light-headedness and numbness.  Psychiatric/Behavioral: Negative for agitation and confusion.  All other systems reviewed and are negative.    Physical Exam Updated Vital Signs BP (!) 154/64   Pulse 85   Temp 98.4 F (36.9 C) (Oral)   Resp 16   Ht '5\' 4"'$  (1.626 m)   Wt 61.7 kg (136 lb)   LMP 02/11/1975   SpO2 100%   BMI 23.34 kg/m   Physical Exam  Constitutional: She is oriented to person, place, and time. She appears well-developed and well-nourished.  Non-toxic appearance. She does not appear ill. No distress.  HENT:  Head: Atraumatic.    Nose: Nose normal.  Mouth/Throat: Oropharynx is clear and moist. No oropharyngeal exudate.  Eyes: Pupils are equal, round, and reactive to light. Conjunctivae and EOM are normal.  Neck: Normal range of motion.  Cardiovascular: Intact distal pulses.  No murmur heard. Pulmonary/Chest: Effort normal and breath sounds normal. No respiratory distress. She has no wheezes. She has no rales. She exhibits no tenderness.  Abdominal: Soft. There is no tenderness. There is no rebound.  Musculoskeletal: She exhibits tenderness.  She exhibits no edema.  Neurological: She is alert and oriented to person, place, and time. No cranial nerve deficit or sensory deficit. She exhibits normal muscle tone.  Skin: Capillary refill takes less than 2 seconds. No rash noted. She is not diaphoretic. No erythema.  Psychiatric: She has a normal mood and affect.  Nursing note and vitals reviewed.    ED Treatments / Results  Labs (all labs ordered are listed, but only  abnormal results are displayed) Labs Reviewed  BASIC METABOLIC PANEL - Abnormal; Notable for the following components:      Result Value   Sodium 134 (*)    CO2 21 (*)    Glucose, Bld 103 (*)    BUN 21 (*)    Creatinine, Ser 1.19 (*)    GFR calc non Af Amer 41 (*)    GFR calc Af Amer 48 (*)    All other components within normal limits  CBG MONITORING, ED - Abnormal; Notable for the following components:   Glucose-Capillary 100 (*)    All other components within normal limits  CBC  SEDIMENTATION RATE  C-REACTIVE PROTEIN    EKG None  Radiology Ct Head Wo Contrast  Result Date: 03/07/2018 CLINICAL DATA:  Headache EXAM: CT HEAD WITHOUT CONTRAST TECHNIQUE: Contiguous axial images were obtained from the base of the skull through the vertex without intravenous contrast. COMPARISON:  CT brain 12/24/2016, MRI 12/10/2016 FINDINGS: Brain: No acute territorial infarction, hemorrhage or intracranial mass is visualized. Mild small vessel ischemic changes of the white matter. Stable ventricle size Vascular: No hyperdense vessels.  No unexpected calcification Skull: Normal. Negative for fracture or focal lesion. Sinuses/Orbits: Postsurgical changes of the maxillary sinuses. Small fluid and mucosal thickening in the right maxillary sinus. Mild mucosal thickening of the ethmoid sinuses. No acute orbital abnormality. Other: None IMPRESSION: 1. No CT evidence for acute intracranial abnormality. 2. Mild small vessel ischemic changes of the white matter Electronically Signed   By:  Donavan Foil M.D.   On: 03/07/2018 19:31    Procedures Procedures (including critical care time)  Medications Ordered in ED Medications  prochlorperazine (COMPAZINE) injection 10 mg (10 mg Intravenous Given 03/07/18 2222)  diphenhydrAMINE (BENADRYL) injection 25 mg (25 mg Intravenous Given 03/07/18 2219)     Initial Impression / Assessment and Plan / ED Course  I have reviewed the triage vital signs and the nursing notes.  Pertinent labs & imaging results that were available during my care of the patient were reviewed by me and considered in my medical decision making (see chart for details).     JAKIAH BIENAIME is a 84 y.o. female with a past medical history significant for temporal arteritis, diabetes, GERD, CAD, hypertension, hyperlipidemia, hypothyroidism, and polymyalgia rheumatica who presents with headache concerning for recurrent temporal arteritis.  Patient reports she has had headache on and off for the last week.  Patient has a history of migraines and severe headaches but says this feels somewhat different.  She reports it is primarily in her right temporal area where there is a small amount of tenderness.  She denies any vision changes.  She says that she did not have vision changes during the last episode of temporal arteritis nearly 10 years ago but it got better with steroids.  She says that her headache is up to an 8 out of 10 in severity but is currently a 4 out of 10.  She denies any nausea vomiting, conservation, diarrhea, dysuria.  She denies any chest pain or shortness of breath.  She denies any urinary symptoms or GI symptoms.  She reports that she is on Plavix.  She denies any head trauma.  While in the waiting room, patient's family reports that the patient had an episode lasting roughly 30 minutes of slow speech.  It was not as much slurred speech or expressive aphasia as slowed speech.    On my exam, patient speech is normal.  No facial droop  seen.  Patient did have a  very mild amount of portal tenderness however extraocular movements intact.  Normal pupils.  No focal neurologic deficit seen on exam.  Lungs clear.  Chest nontender.  Abdomen nontender.  Neurology was called based on the reported transient speech difficulty.  After discussion, we do not feel this is a TIA or stroke however the CT head showed no acute intracranial normalities.  There was small vessel disease seen.  Patient's creatinine was slightly elevated but potassium is normal.  CBC reassuring.  ESR and CRP were ordered and are still in process.  Neurology recommended administration of prednisone if the ESR or CRP is elevated and follow-up with PCP and neurology.  If the ESR and CRP are negative, they did not feel she needs steroids but will still need follow-up.  He did recommend treating with a migraine cocktail to help alleviate her headache and see if this improves her symptoms.    Care transferred to [facility] while awaiting results of ESR and CRP.  Anticipate discharge due to the patient not having any visual deficits however if ESR or CRP elevated, patient will be started on steroids.         Final Clinical Impressions(s) / ED Diagnoses   Final diagnoses:  Right temporal headache   Clinical Impression: 1. Right temporal headache     Disposition: Care transferred while awaiting laboratory testing results.     Tegeler, Gwenyth Allegra, MD 03/07/18 2351

## 2018-03-07 NOTE — ED Triage Notes (Signed)
Pt c/o worsening headache and "temporal arthritis pain since last Thursday".

## 2018-03-07 NOTE — ED Provider Notes (Signed)
Care assumed from previous provider Dr. Sherry Ruffing. Please see their note for further details to include full history and physical. To summarize in short pt is a 84 year old female past medical history significant for temporal arteritis that presents to the ED with complaints of temporal headache.. Case discussed, plan agreed upon.  Time of care handoff was awaiting CRP and ESR.  Prior provider spoke with neurology who felt that patient did not need an MRI.  If CRP and ESR elevated patient will need treatment with steroids.  Did not feel the patient needs admission given that she has no vision changes.  Improved with migraine cocktail.  CRP and ESR slightly elevated.  Will start patient on high-dose prednisone per prior providers recommendations.  She has follow-up appoint with her primary care doctor tomorrow.  Pt is hemodynamically stable, in NAD, & able to ambulate in the ED. Evaluation does not show pathology that would require ongoing emergent intervention or inpatient treatment. I explained the diagnosis to the patient. Pain has been managed & has no complaints prior to dc. Pt is comfortable with above plan and is stable for discharge at this time. All questions were answered prior to disposition. Strict return precautions for f/u to the ED were discussed. Encouraged follow up with PCP.        Doristine Devoid, PA-C 03/08/18 0104    Tegeler, Gwenyth Allegra, MD 03/08/18 [phone]

## 2018-03-07 NOTE — Progress Notes (Signed)
Internal Medicine Clinic Attending  Case discussed with Dr. Guilloud  at the time of the visit.  We reviewed the resident's history and exam and pertinent patient test results.  I agree with the assessment, diagnosis, and plan of care documented in the resident's note.  Alexander N Raines, MD   

## 2018-03-08 DIAGNOSIS — R51 Headache: Secondary | ICD-10-CM | POA: Diagnosis not present

## 2018-03-08 LAB — SEDIMENTATION RATE: Sed Rate: 30 mm/hr — ABNORMAL HIGH (ref 0–22)

## 2018-03-08 MED ORDER — PREDNISONE 20 MG PO TABS
60.0000 mg | ORAL_TABLET | Freq: Once | ORAL | Status: AC
  Administered 2018-03-08: 60 mg via ORAL
  Filled 2018-03-08: qty 3

## 2018-03-08 MED ORDER — PREDNISONE 20 MG PO TABS: 60.0000 mg | ORAL_TABLET | Freq: Every day | ORAL | 0 refills | Status: DC

## 2018-03-08 NOTE — ED Notes (Signed)
Pt verbalizes understanding of d/c instructions. Pt received prescriptions. Pt taken to lobby in wheelchair at d/c with all belongings.   

## 2018-03-08 NOTE — Discharge Instructions (Addendum)
Lab work was slightly elevated.  Please start taking the steroid starting tomorrow.  This is a high dose steroid.  Will need to be continued for at least 2 weeks.  Do not abruptly stop taking this medication.  You need to make sure you follow-up with your primary care doctor within this next week.  Return to the ED if you develop any worsening symptoms or vision changes.

## 2018-03-09 ENCOUNTER — Other Ambulatory Visit: Payer: Self-pay

## 2018-03-09 ENCOUNTER — Encounter: Payer: Self-pay | Admitting: Dietician

## 2018-03-09 ENCOUNTER — Telehealth: Payer: Self-pay | Admitting: Physical Therapy

## 2018-03-09 ENCOUNTER — Ambulatory Visit (INDEPENDENT_AMBULATORY_CARE_PROVIDER_SITE_OTHER): Payer: Medicare Other | Admitting: Internal Medicine

## 2018-03-09 ENCOUNTER — Telehealth: Payer: Self-pay | Admitting: *Deleted

## 2018-03-09 ENCOUNTER — Telehealth: Payer: Self-pay | Admitting: Dietician

## 2018-03-09 ENCOUNTER — Ambulatory Visit: Payer: Medicare Other | Admitting: Physical Therapy

## 2018-03-09 VITALS — BP 132/65 | HR 83 | Temp 98.0°F | Ht 64.0 in | Wt 136.6 lb

## 2018-03-09 DIAGNOSIS — R011 Cardiac murmur, unspecified: Secondary | ICD-10-CM

## 2018-03-09 DIAGNOSIS — F329 Major depressive disorder, single episode, unspecified: Secondary | ICD-10-CM | POA: Diagnosis not present

## 2018-03-09 DIAGNOSIS — I2510 Atherosclerotic heart disease of native coronary artery without angina pectoris: Secondary | ICD-10-CM

## 2018-03-09 DIAGNOSIS — R519 Headache, unspecified: Secondary | ICD-10-CM

## 2018-03-09 DIAGNOSIS — Z7902 Long term (current) use of antithrombotics/antiplatelets: Secondary | ICD-10-CM | POA: Diagnosis not present

## 2018-03-09 DIAGNOSIS — R51 Headache: Secondary | ICD-10-CM

## 2018-03-09 DIAGNOSIS — K219 Gastro-esophageal reflux disease without esophagitis: Secondary | ICD-10-CM | POA: Diagnosis not present

## 2018-03-09 DIAGNOSIS — E039 Hypothyroidism, unspecified: Secondary | ICD-10-CM | POA: Diagnosis not present

## 2018-03-09 DIAGNOSIS — Z7984 Long term (current) use of oral hypoglycemic drugs: Secondary | ICD-10-CM

## 2018-03-09 DIAGNOSIS — E119 Type 2 diabetes mellitus without complications: Secondary | ICD-10-CM

## 2018-03-09 DIAGNOSIS — Z79899 Other long term (current) drug therapy: Secondary | ICD-10-CM | POA: Diagnosis not present

## 2018-03-09 DIAGNOSIS — I10 Essential (primary) hypertension: Secondary | ICD-10-CM | POA: Diagnosis not present

## 2018-03-09 NOTE — Assessment & Plan Note (Signed)
Assessment BG elevated in 170s-260s range after starting high dose steroids. She did not bring her glucometer with her today. Only on glimepiride 1mg  daily, reports compliance with this.  Plan - Message sent to Dr. Maudie Mercury and Butch Penny to assist with protocol for hyperglycemia while on steroids - RTC in 1-2 weeks for diabetes check

## 2018-03-09 NOTE — Telephone Encounter (Signed)
Steroid-Induced Hyperglycemia Prevention and Management Christine [name] is a 84 y.o. female who meets criteria for [facility] glucose monitoring program (diabetes patient prescribed short course of steroids).  A/P Current Regimen  Patient prescribed prednisone 60 mg daily x 30 days, currently on day 2 of therapy. Patient taking prednisone in the AM  Prednisone indication: headaches possible GCA  Current DM regimen glimiperide 1 mg daily  Home BG Monitoring  Patient does have a meter at home and does check BG at home. Meter was not supplied.  CBGs at home 170-200s per Dr. Ronalee Red, I left a message for return call.   CBGs prior to steroid course 130-140s, A1C prior to steroid course 6.7%  Medication Management  Switch prednisone dose to AM not applicable  Additional treatment for BG control is not indicated at this time.  Physician preference level per protocol: 1  Medication supply Patient will use own medication  Patient Education  None done today  Follow-up by phone tomorrow  1-2 weeks  Butch Penny Plyler 4:32 PM 03/09/2018

## 2018-03-09 NOTE — Assessment & Plan Note (Signed)
Assessment She was seen in the ED on 4/20 and was started on high dose prednisone due to right sided temporal headaches and elevated ESR and CRP, with concern for GCA. She reports that since starting the prednisone, the headaches have completely resolved. Denies vision loss. She is not sure whether she has ever had a temporal artery biopsy for the diagnosis of GCA.  Plan - Continue prednisone 66m daily --- Will need better control of BG while on high dose steroids - Urgent referral to [facility] placed for temporal artery biopsy

## 2018-03-09 NOTE — Progress Notes (Signed)
CC: follow-up of headache  HPI:  Christine [name] is a 84 y.o. female with PMH of migraine, HTN, CAD, GERD, DM2, hypothyroidism, and depression who presents for follow-up of headache.  Headache: Seen in the ED on 4/20 for right temporal headache. She denied jaw claudication or vision loss with her headaches. Neurology was consulted who recommended starting prednisone if ESR and CRP were elevated. ESR elevated at 30 and CRP elevated at 1.2. She was thus started on prednisone 76m daily. Since starting this, she reports her headaches have resolved. Denies vision loss.  She has a questionable history of PMR and GCA. She states she has had headaches in the past due to temporal arteritis and was on prednisone. She is not sure what the dose was at that time. She did have an ANA Ab titer 1:40 in 09/2017. She followed up with Rheumatology in 12/2017, note available in [facility]. During that visit, it was determined that she did not need any further work-up for rheumatologic diseases.   Diabetes: Since starting prednisone, her BGs have been elevated. She did not bring her glucometer today, but reports readings in the 170s-260s. Prior to starting prednisone, BG in the 130s-140s range. She is only on glimepiride 182mdaily, with which she reports compliance.  HTN: BP well controlled at 132/65. She reports compliance with amlodipine 1013maily.  Please see the assessment and plan below for the status of the patient's chronic medical problems.  Past Medical History:  Diagnosis Date  . Anemia   . Anginal pain ([place] . Arthritis    "back, arms, hips; hands" (11/30/2015)  . Benign hypertensive heart and kidney disease with diastolic CHF, NYHA class II and CKD stage III ([place] . Blood transfusion 1972   "after daughter born, attempted to give me blood; couldn't give it cause my blood was cold" (09/23/2013)  . CAD (coronary artery disease)    a. LHC 08/23/11: dLM 40-50%, oRI 40%, oCFX 40%, oD1 70% (small and  not amenable to PCI).  LM lesion did not appear to be flow limiting.  Medical rx was recommended;  b. Echo 08/23/11: mild LVH, EF 60-65%, grade 1 diast dysfxn, mild BAE, PASP 24;  c. 02/2012  Cath: LM 50d, LAD 50p, D1 70ost, RI 70p, RCA ok->Med Rx;  d. 01/2013 Cardiolite: EF 88, no ischemia/infarct.  . Carotid stenosis    a. dopplers 10/12:  0-39% bilat ICA;  b. 10/2012 U/S: 0-39% bilat, f/u 1 yr (10/2013).  . Chronic bronchitis   . Depression   . Diverticulosis of colon (without mention of hemorrhage)   . GERD (gastroesophageal reflux disease)   . Heart murmur, systolic    2-D echo in December 2011 showed a normal EF with grade 1 diastolic dysfunction, trivial pulmonary regurgitation and mildly elevated PA pressure at 37 mmHg probably secondary to her COPD.dynamic obstruction-mid cavity obliteration;  b. 02/2012 Echo: EF 60-65%, mild LVH, PASP 83m57m  . HiMarland Kitchentory of stomach ulcers 1970's  . Hyperlipidemia   . Hypertension   . Hypothyroidism   . Migraines    a. Next atypical symptoms in the past. Patient was started on Neurontin for possible neuropathic origin of her pain.  . Polymyalgia rheumatica (HCC)[place]. Shortness of breath on exertion    "just related to angina >1 yr ago" (09/23/2013)  . Sinus arrhythmia   . Type II diabetes mellitus (HCC)[place] a. On oral hypoglycemic agents.   Review of Systems:   GEN:  Negative for weight loss NEURO: Negative for focal neuro weakness. Headaches resolved with initiation of steroids. Neg for jaw claudication. CV: Negative for chest pain PULM: Negative for shortness of breath ENDO: Negative for polydipsia, or polyuria  Physical Exam:  Vitals:   03/09/18 1340  BP: 132/65  Pulse: 83  Temp: 98 F (36.7 C)  TempSrc: Oral  SpO2: 96%  Weight: 136 lb 9.6 oz (62 kg)  Height: _0  (1.626 m)   GEN: Sitting in chair in NAD HENT: No temporal TTP. MMM, no visible lesions. CV: NR & RR, systolic murmur best heard at RUSB, no r/g PULM: CTAB, no wheezes or  rales MSK: No LE edema  Assessment & Plan:   See Encounters Tab for problem based charting.  Patient discussed with Dr. Angelia Mould

## 2018-03-09 NOTE — Assessment & Plan Note (Addendum)
Assessment BP controlled at 132/65 on amlodipine 10mg  daily. Asymptomatic.  Plan - Continue amlodipine 10mg  daily - F/u in 1-2 weeks

## 2018-03-09 NOTE — Telephone Encounter (Signed)
Contacted pt regarding multiple cancelled sessions and to let pt know that session today was last scheduled session.  No answer, LVM asking pt to contact clinic to either: schedule re-assessment and further PT appointments or D/C at this time due to other medical issues (headache, BP, etc.).  Will await to hear from pt.  If pt does not return call, will proceed with D/C at this time.  Rico Junker, PT, DPT 03/09/18    9:23 AM

## 2018-03-09 NOTE — Patient Instructions (Signed)
FOLLOW-UP INSTRUCTIONS When: 1-2 weeks For: diabetes and headache follow-up What to bring: medications, glucometer   Christine [name],  I am glad the steroids are helping with your headache. We are sending a referral to the vascular doctors to get a biopsy. This is the way to tell if you have temporal arteritis.  While you are on steroids, we are going to try to get better control of your diabetes. I am sending a message to our diabetes specialists who will be able to work on how much of your medicines to take.  Please follow up with [place] in 1-2 weeks.

## 2018-03-09 NOTE — Telephone Encounter (Signed)
CALLED [facility] OFFICE [phone] OFFICE TO LET THEM BE AWARE OF THIS REFERRAL IN THEIR WORK Q. LVM ON SCHEDULER VM.

## 2018-03-10 ENCOUNTER — Ambulatory Visit: Payer: Self-pay | Admitting: Pulmonary Disease

## 2018-03-10 NOTE — Progress Notes (Signed)
Internal Medicine Clinic Attending  Case discussed with Dr. Huang at the time of the visit.  We reviewed the resident's history and exam and pertinent patient test results.  I agree with the assessment, diagnosis, and plan of care documented in the resident's note. 

## 2018-03-11 NOTE — Telephone Encounter (Signed)
Feeling better. She is taking 60 mg 3 pills a day of prednisone, has been limiting her carbs. She verbalizes understanding to affect of steroids on blood sugar and prefers not to change her diabetes medication at this point.  Her reported Blood sugars: Sunday 260 915 am, 119pm  171, 322 Pm- 162 Monday 145 in am, 209 at 351PM , 720 Pm 247 Tuesday- 135 in am, 2 Pm- 200, 709 pm 232 Wednesday- 94 this am, 155 pm 86  Plan- no change in medication even though she has had several readings > 200 mg and no plans to taper the prednisone until at least May 6. She is trying to eat less carbs, and thinks that is why her readings are lower today. Wikl continue to monitor by phone.  Her glimiperide can b cut in half if needed to increase the dose temporarily.  Christine [name], RD , Diabetes Educator 03/11/2018 4:22 PM.

## 2018-03-13 ENCOUNTER — Ambulatory Visit: Payer: Medicare Other | Admitting: Physical Therapy

## 2018-03-14 ENCOUNTER — Encounter: Payer: Self-pay | Admitting: Physical Therapy

## 2018-03-14 NOTE — Therapy (Signed)
[facility] [address] [place], [place], [address] Phone: [phone]   Fax:  [phone]  Patient Details  Name: Christine [name] MRN: [id number] Date of Birth: 01/24/1934 Referring Provider:  No ref. provider found  Encounter Date: 03/14/2018 PHYSICAL THERAPY DISCHARGE SUMMARY  Visits from Start of Care: 1  Current functional level related to goals / functional outcomes: Unable to assess; pt did not return for physical therapy treatment sessions after initial evaluation despite attempts to contact patient and reschedule missed visits.   Remaining deficits: Motion sensitivity, impaired strength, orthostatic hypotension   Education / Equipment: Unable to provide HEP  Plan: Patient agrees to discharge.  Patient goals were not met. Patient is being discharged due to not returning since the last visit.  ?????     F , PT, DPT 03/14/18    4:55 PM   [facility] [address] [name], [name], [address] Phone: [phone]   Fax:  [phone] 

## 2018-03-17 NOTE — Telephone Encounter (Signed)
Ms Achey says her blood sugars are pretty good. She also reports she has not had any headaches.  4/25- 126, 172, 357, 156 4/26- 99, 172, 202, 161 4/27- 109, 131, 197   4/28- 101,150, 260  4/29- 101, 107, 226 4/30- 93,  124,   Plan: no change to meds. Gave her some ideas for low carb foods. Follow up here on Monday.  Debera Lat, RD 03/17/2018 3:51 PM.

## 2018-03-23 ENCOUNTER — Other Ambulatory Visit: Payer: Self-pay

## 2018-03-23 ENCOUNTER — Ambulatory Visit (INDEPENDENT_AMBULATORY_CARE_PROVIDER_SITE_OTHER): Payer: Medicare Other | Admitting: Internal Medicine

## 2018-03-23 VITALS — BP 115/52 | HR 83 | Temp 98.6°F | Ht 64.0 in | Wt 130.5 lb

## 2018-03-23 DIAGNOSIS — E119 Type 2 diabetes mellitus without complications: Secondary | ICD-10-CM

## 2018-03-23 DIAGNOSIS — Z7952 Long term (current) use of systemic steroids: Secondary | ICD-10-CM | POA: Diagnosis not present

## 2018-03-23 DIAGNOSIS — R5381 Other malaise: Secondary | ICD-10-CM

## 2018-03-23 DIAGNOSIS — R51 Headache: Secondary | ICD-10-CM | POA: Diagnosis not present

## 2018-03-23 DIAGNOSIS — G4700 Insomnia, unspecified: Secondary | ICD-10-CM

## 2018-03-23 DIAGNOSIS — Z7902 Long term (current) use of antithrombotics/antiplatelets: Secondary | ICD-10-CM | POA: Diagnosis not present

## 2018-03-23 DIAGNOSIS — R110 Nausea: Secondary | ICD-10-CM

## 2018-03-23 DIAGNOSIS — Z6822 Body mass index (BMI) 22.0-22.9, adult: Secondary | ICD-10-CM | POA: Diagnosis not present

## 2018-03-23 DIAGNOSIS — R5383 Other fatigue: Secondary | ICD-10-CM

## 2018-03-23 DIAGNOSIS — R42 Dizziness and giddiness: Secondary | ICD-10-CM

## 2018-03-23 DIAGNOSIS — R634 Abnormal weight loss: Secondary | ICD-10-CM

## 2018-03-23 DIAGNOSIS — E1169 Type 2 diabetes mellitus with other specified complication: Secondary | ICD-10-CM

## 2018-03-23 DIAGNOSIS — R519 Headache, unspecified: Secondary | ICD-10-CM

## 2018-03-23 LAB — GLUCOSE, CAPILLARY: GLUCOSE-CAPILLARY: 93 mg/dL (ref 65–99)

## 2018-03-23 NOTE — Progress Notes (Signed)
CC: Follow up for diabetes management on high dose steroids  HPI:  [name] is a 84 y.o. female with PMHx detailed below presenting for follow up of her diabetes after starting treatment on high dose prednisone for possible giant cell arteritis.  Initial testing was concerned due to unilateral headache in the elderly woman with an ESR of 30 and CRP of 1.2 and a reported history of possible temporal arteritis 10 years ago.  There were no features of jaw claudication or eye involvement. Since taking this medicine she has felt severely fatigued, nauseated with poor appetite, and her blood sugars have been highly variable.  She does note that her headache is better however she is still getting dizzy and generally feeling terrible.  She has not noticed any new vision changes.  See problem based assessment and plan below for additional details.  Headache Most of her symptomatic complaints today I believe are related to the high-dose prednisone.  Unusually she is having nausea and severely decreased appetite leading to underlying hypoglycemia despite the high dose of glucocorticoids.  Overall I am not particularly convinced that this truly represents giant cell arteritis based on a chart review of the reported symptoms, her previous rheumatological evaluation that did not strongly support PMR, and inflammatory markers that are probably physiologic in an 84 year old woman. In any case her headache symptoms are fully resolved and I think any dizziness she still having is due to severely decreased p.o. intake (associated 6 to 7 pound weight loss by clinic measurement).  Ideally she can obtain a temporal artery biopsy with vascular surgery for definitive diagnosis especially since she is tolerating the treatment so poorly. Plan: We will start prednisone taper this week down to 50 mg for 1 week and then down to 40 mg We can continue a slow taper, if biopsies obtained with negative results can DC the  steroids more aggressively Follow-up at internal medicine clinic in 2 weeks   Past Medical History:  Diagnosis Date  . Anemia   . Anginal pain ([place])   . Arthritis    "back, arms, hips; hands" (11/30/2015)  . Benign hypertensive heart and kidney disease with diastolic CHF, NYHA class II and CKD stage III ([place])   . Blood transfusion 1972   "after daughter born, attempted to give me blood; couldn't give it cause my blood was cold" (09/23/2013)  . CAD (coronary artery disease)    a. LHC 08/23/11: dLM 40-50%, oRI 40%, oCFX 40%, oD1 70% (small and not amenable to PCI).  LM lesion did not appear to be flow limiting.  Medical rx was recommended;  b. Echo 08/23/11: mild LVH, EF 60-65%, grade 1 diast dysfxn, mild BAE, PASP 24;  c. 02/2012  Cath: LM 50d, LAD 50p, D1 70ost, RI 70p, RCA ok->Med Rx;  d. 01/2013 Cardiolite: EF 88, no ischemia/infarct.  . Carotid stenosis    a. dopplers 10/12:  0-39% bilat ICA;  b. 10/2012 U/S: 0-39% bilat, f/u 1 yr (10/2013).  . Chronic bronchitis   . Depression   . Diverticulosis of colon (without mention of hemorrhage)   . GERD (gastroesophageal reflux disease)   . Heart murmur, systolic    2-D echo in December 2011 showed a normal EF with grade 1 diastolic dysfunction, trivial pulmonary regurgitation and mildly elevated PA pressure at 37 mmHg probably secondary to her COPD.dynamic obstruction-mid cavity obliteration;  b. 02/2012 Echo: EF 60-65%, mild LVH, PASP 66mHg.  .Marland KitchenHistory of stomach ulcers 1970's  . Hyperlipidemia   .  Hypertension   . Hypothyroidism   . Migraines    a. Next atypical symptoms in the past. Patient was started on Neurontin for possible neuropathic origin of her pain.  . Polymyalgia rheumatica ([place])   . Shortness of breath on exertion    "just related to angina >1 yr ago" (09/23/2013)  . Sinus arrhythmia   . Type II diabetes mellitus ([place])    a. On oral hypoglycemic agents.    Review of Systems: Review of Systems  Constitutional: Positive for  malaise/fatigue and weight loss.  Eyes: Negative for blurred vision.  Cardiovascular: Negative for leg swelling.  Gastrointestinal: Positive for nausea.  Neurological: Positive for dizziness. Negative for headaches.  Psychiatric/Behavioral: The patient has insomnia.      Physical Exam: Vitals:   03/23/18 1027  BP: (!) 115/52  Pulse: 83  Temp: 98.6 F (37 C)  TempSrc: Oral  SpO2: 100%  Weight: 130 lb 8 oz (59.2 kg)  Height: _0  (1.626 m)   GENERAL- alert, co-operative, NAD HEENT-moist oral mucosa, no conjunctival injection, no tenderness over temporal artery CARDIAC- RRR, no murmurs, rubs or gallops. RESP- CTAB, no wheezes or crackles. ABDOMEN- Soft, nontender,  normoactive bowel sounds present EXTREMITIES- Symmetric, no pedal edema. SKIN- Warm, dry, No rash or lesion. PSYCH- Normal mood and affect, appropriate thought content and speech.   Assessment & Plan:   See encounters tab for problem based medical decision making.   Patient discussed with Dr. Dareen Piano

## 2018-03-23 NOTE — Patient Instructions (Signed)
I think you should try to eat more to avoid low blood sugars due to fasting. If we find that you do need prolonged steroid treatment we can add medication to control the associated high blood sugar.  Right now you are getting both highs and lows so we would need to have you come back with your glucometer and decide on any medicine changes. If we ultimately do not need to continue the steroids this would simplify things.  I definitely recommend following up with the vascular surgeon on Wednesday. A biopsy of the temporal artery would be very reassuring that you do NOT need a long amount of steroids which would spare you these side effects for months.

## 2018-03-25 ENCOUNTER — Telehealth: Payer: Self-pay | Admitting: *Deleted

## 2018-03-25 ENCOUNTER — Other Ambulatory Visit: Payer: Self-pay

## 2018-03-25 ENCOUNTER — Encounter: Payer: Self-pay | Admitting: Surgery

## 2018-03-25 ENCOUNTER — Ambulatory Visit (INDEPENDENT_AMBULATORY_CARE_PROVIDER_SITE_OTHER): Payer: Medicare Other | Admitting: Surgery

## 2018-03-25 VITALS — BP 131/65 | HR 84 | Temp 98.8°F | Resp 16 | Ht 64.0 in | Wt 127.0 lb

## 2018-03-25 DIAGNOSIS — M316 Other giant cell arteritis: Secondary | ICD-10-CM

## 2018-03-25 DIAGNOSIS — I6529 Occlusion and stenosis of unspecified carotid artery: Secondary | ICD-10-CM

## 2018-03-25 NOTE — Telephone Encounter (Signed)
She can likely be decreased to 57m daily, ideally I would repeat a ESR and CRP to assess response.  I reviewed Dr BStephens Shirenote and it appears she is unsure about getting the biopsy.  It may be best to schedule her a visit to discuss again the importance of this, I will ask Dr RBenjamine Molato weight in about this.

## 2018-03-25 NOTE — Assessment & Plan Note (Signed)
Most of her symptomatic complaints today I believe are related to the high-dose prednisone.  Unusually she is having nausea and severely decreased appetite leading to underlying hypoglycemia despite the high dose of glucocorticoids.  Overall I am not particularly convinced that this truly represents giant cell arteritis based on a chart review of the reported symptoms, her previous rheumatological evaluation that did not strongly support PMR, and inflammatory markers that are probably physiologic in an 84 year old woman. In any case her headache symptoms are fully resolved and I think any dizziness she still having is due to severely decreased p.o. intake (associated 6 to 7 pound weight loss by clinic measurement).  Ideally she can obtain a temporal artery biopsy with vascular surgery for definitive diagnosis especially since she is tolerating the treatment so poorly. Plan: We will start prednisone taper this week down to 50 mg for 1 week and then down to 40 mg We can continue a slow taper, if biopsies obtained with negative results can DC the steroids more aggressively Follow-up at internal medicine clinic in 2 weeks

## 2018-03-25 NOTE — Progress Notes (Signed)
[facility] [place]  Patient name: Christine [name] MRN: [id number] DOB: 01/24/1934 Sex: female   REQUESTING PROVIDER:    Dr. Ronalee Red   REASON FOR CONSULT:    Temporal arteritis  HISTORY OF PRESENT ILLNESS:   Christine [name] is a 84 y.o. female, who is referred today for temporal arteritis.  The patient went to the emergency department on 420 for right temporal headache.  She did not have jaw pain or vision loss.  Neurology is was consulted.  Sed rate was 30 C-reactive protein was 1.2.  She was started on prednisone.  She states that she feels better after the steroids.  She recently started having some pain in her left side.  The patient states that she was told she had temporal arteritis approximately 10 years ago.   She was on steroids for 6 months.  Patient suffers from diabetes.  Her sugars have been more elevated since starting her steroids.  She is medically managed for hypertension.  She takes a statin for hypercholesterolemia.  She also suffers from coronary artery disease.    PAST MEDICAL HISTORY    Past Medical History:  Diagnosis Date  . Anemia   . Anginal pain ([place])   . Arthritis    "back, arms, hips; hands" (11/30/2015)  . Benign hypertensive heart and kidney disease with diastolic CHF, NYHA class II and CKD stage III ([place])   . Blood transfusion 1972   "after daughter born, attempted to give me blood; couldn't give it cause my blood was cold" (09/23/2013)  . CAD (coronary artery disease)    a. LHC 08/23/11: dLM 40-50%, oRI 40%, oCFX 40%, oD1 70% (small and not amenable to PCI).  LM lesion did not appear to be flow limiting.  Medical rx was recommended;  b. Echo 08/23/11: mild LVH, EF 60-65%, grade 1 diast dysfxn, mild BAE, PASP 24;  c. 02/2012  Cath: LM 50d, LAD 50p, D1 70ost, RI 70p, RCA ok->Med Rx;  d. 01/2013 Cardiolite: EF 88, no ischemia/infarct.  . Carotid stenosis    a. dopplers 10/12:  0-39% bilat ICA;  b. 10/2012  U/S: 0-39% bilat, f/u 1 yr (10/2013).  . Chronic bronchitis   . Depression   . Diverticulosis of colon (without mention of hemorrhage)   . GERD (gastroesophageal reflux disease)   . Heart murmur, systolic    2-D echo in December 2011 showed a normal EF with grade 1 diastolic dysfunction, trivial pulmonary regurgitation and mildly elevated PA pressure at 37 mmHg probably secondary to her COPD.dynamic obstruction-mid cavity obliteration;  b. 02/2012 Echo: EF 60-65%, mild LVH, PASP 41mmHg.  Marland Kitchen History of stomach ulcers 1970's  . Hyperlipidemia   . Hypertension   . Hypothyroidism   . Migraines    a. Next atypical symptoms in the past. Patient was started on Neurontin for possible neuropathic origin of her pain.  . Polymyalgia rheumatica ([place])   . Shortness of breath on exertion    "just related to angina >1 yr ago" (09/23/2013)  . Sinus arrhythmia   . Type II diabetes mellitus ([place])    a. On oral hypoglycemic agents.     FAMILY HISTORY   Family History  Problem Relation Age of Onset  . Colon cancer Maternal Grandmother   . Heart attack Maternal Grandmother   . COPD Father   . Other Mother        died of unknown causes in her 6's. Pt raised by grandmother.  . Pulmonary Hypertension  Daughter     SOCIAL HISTORY:   Social History   Socioeconomic History  . Marital status: Widowed    Spouse name: Not on file  . Number of children: Not on file  . Years of education: Not on file  . Highest education level: Not on file  Occupational History  . Not on file  Social Needs  . Financial resource strain: Not on file  . Food insecurity:    Worry: Not on file    Inability: Not on file  . Transportation needs:    Medical: Not on file    Non-medical: Not on file  Tobacco Use  . Smoking status: Never Smoker  . Smokeless tobacco: Never Used  Substance and Sexual Activity  . Alcohol use: No    Alcohol/week: 0.0 oz    Comment: 11/29/2016 "Last drink 1967"  . Drug use: No  . Sexual  activity: Not Currently  Lifestyle  . Physical activity:    Days per week: Not on file    Minutes per session: Not on file  . Stress: Not on file  Relationships  . Social connections:    Talks on phone: Not on file    Gets together: Not on file    Attends religious service: Not on file    Active member of club or organization: Not on file    Attends meetings of clubs or organizations: Not on file    Relationship status: Not on file  . Intimate partner violence:    Fear of current or ex partner: Not on file    Emotionally abused: Not on file    Physically abused: Not on file    Forced sexual activity: Not on file  Other Topics Concern  . Not on file  Social History Narrative   Never smoked. Lives in [place] with her dtr.  Care for her daughter who has had a lung transplant. Retired Building surveyor.    ALLERGIES:    Allergies  Allergen Reactions  . Aspirin Swelling and Other (See Comments)    Angioedema  . Atarax [Hydroxyzine] Other (See Comments)    psychosis  . Nsaids Other (See Comments)    "interacts with heart medications"  . Codeine Nausea And Vomiting and Other (See Comments)    "makes me deathly sick" - can take with nausea medicine   . Haldol [Haloperidol] Other (See Comments)    EPS - rigidity and dystonic reaction, very sensitive  . Other Other (See Comments)    MSG  . Penicillins Diarrhea    "real bad" Has patient had a PCN reaction causing immediate rash, facial/tongue/throat swelling, SOB or lightheadedness with hypotension: Yes Has patient had a PCN reaction causing severe rash involving mucus membranes or skin necrosis: No Has patient had a PCN reaction that required hospitalization No Has patient had a PCN reaction occurring within the last 10 years: Yes If all of the above answers are "NO", then may proceed with Cephalosporin use.     CURRENT MEDICATIONS:    Current Outpatient Medications  Medication Sig Dispense Refill  . acetaminophen (TYLENOL)  500 MG tablet Take 1,000 mg by mouth every 6 (six) hours as needed for headache. For pain    . ADVAIR DISKUS 250-50 MCG/DOSE AEPB Inhale 1 puff into the lungs 2 (two) times daily.   7  . albuterol (PROVENTIL HFA;VENTOLIN HFA) 108 (90 Base) MCG/ACT inhaler Inhale 1-2 puffs into the lungs every 6 (six) hours as needed for wheezing or shortness of breath.  18 g 2  . amLODipine (NORVASC) 10 MG tablet take 1 tablet by mouth once daily 90 tablet 3  . Ascorbic Acid (VITAMIN C PO) Take 1 tablet by mouth daily.     Marland Kitchen atorvastatin (LIPITOR) 40 MG tablet Take 0.5 tablets (20 mg total) by mouth every evening. 90 tablet 1  . clopidogrel (PLAVIX) 75 MG tablet take 1 tablet by mouth once daily EVERY EVENING 30 tablet 5  . esomeprazole (NEXIUM) 20 MG capsule Take 1 capsule (20 mg total) by mouth daily with breakfast. 90 capsule 3  . fluticasone (FLONASE) 50 MCG/ACT nasal spray Place 1 spray into both nostrils daily. 16 g 2  . glimepiride (AMARYL) 1 MG tablet take 1 tablet by mouth once daily BEFORE BREAKFAST 90 tablet 3  . imipramine (TOFRANIL) 50 MG tablet Take 1 tablet (50 mg total) by mouth 2 (two) times daily. 180 tablet 1  . irbesartan (AVAPRO) 150 MG tablet Take 1 tablet (150 mg total) by mouth daily. 30 tablet 6  . levothyroxine (SYNTHROID, LEVOTHROID) 25 MCG tablet Take 1 tablet (25 mcg total) by mouth daily. 90 tablet 1  . metoprolol tartrate (LOPRESSOR) 25 MG tablet Take 25 mg by mouth 2 (two) times daily.    . Multiple Vitamin (MULTIVITAMIN WITH MINERALS) TABS tablet Take 1 tablet by mouth daily. 30 tablet 2  . nitroGLYCERIN (NITROSTAT) 0.4 MG SL tablet place 1 tablet under the tongue if needed every 5 minutes for chest pain for 3 doses IF NO RELIEF AFTER FIRST DOSE CALL PRESCRIBER OR 911. 25 tablet 3  . ONE TOUCH ULTRA TEST test strip 1 each by Other route 2 (two) times daily. 100 each 2  . polycarbophil (FIBERCON) 625 MG tablet Take 1 tablet (625 mg total) by mouth daily. 30 tablet 2  . predniSONE  (DELTASONE) 20 MG tablet Take 3 tablets (60 mg total) by mouth daily with breakfast. 90 tablet 0  . promethazine (PHENERGAN) 12.5 MG tablet take 1 tablet by mouth every 6 hours if needed for nausea and vomiting 30 tablet 6  . senna (SENOKOT) 8.6 MG TABS tablet Take 1 tablet (8.6 mg total) by mouth at bedtime as needed for mild constipation. 120 each 1   No current facility-administered medications for this visit.     REVIEW OF SYSTEMS:   [X]  denotes positive finding, [ ]  denotes negative finding Cardiac  Comments:  Chest pain or chest pressure:    Shortness of breath upon exertion:    Short of breath when lying flat:    Irregular heart rhythm:        Vascular    Pain in calf, thigh, or hip brought on by ambulation:    Pain in feet at night that wakes you up from your sleep:     Blood clot in your veins:    Leg swelling:  x       Pulmonary    Oxygen at home:    Productive cough:     Wheezing:         Neurologic    Sudden weakness in arms or legs:     Sudden numbness in arms or legs:     Sudden onset of difficulty speaking or slurred speech:    Temporary loss of vision in one eye:     Problems with dizziness:         Gastrointestinal    Blood in stool:      Vomited blood:  Genitourinary    Burning when urinating:     Blood in urine:        Psychiatric    Major depression:         Hematologic    Bleeding problems:    Problems with blood clotting too easily:        Skin    Rashes or ulcers:        Constitutional    Fever or chills:     PHYSICAL EXAM:   Vitals:   03/25/18 1034  BP: 131/65  Pulse: 84  Resp: 16  Temp: 98.8 F (37.1 C)  TempSrc: Oral  SpO2: 100%  Weight: 127 lb (57.6 kg)  Height: 5\' 4"  (1.626 m)    GENERAL: The patient is a well-nourished female, in no acute distress. The vital signs are documented above. CARDIAC: There is a regular rate and rhythm.  VASCULAR: Doppler signals in bilateral temporal arteries. PULMONARY:  Nonlabored respirations NoneNEUROLOGIC: No focal weakness or paresthesias are detected. SKIN: There are no ulcers or rashes noted. PSYCHIATRIC: The patient has a normal affect.  STUDIES:   None  ASSESSMENT and PLAN   Possible temporal arteritis: The patient initially had right-sided symptoms but now is having some left-sided issues as well as dizziness.  We discussed proceeding with bilateral temporal artery biopsies.  The daughter and patient have multiple questions regarding the need to proceed with biopsy.  I have encouraged him to reach back out to Dr. Ronalee Red to further debate whether or not this is needed.  If so, she can contact my office and this will be scheduled.  I did tell the family that my recommendation would be to proceed with biopsy.   Annamarie Major, MD [facility] [phone] Pager [phone]

## 2018-03-25 NOTE — Telephone Encounter (Signed)
I spoke with Christine [name] and explained the plans directly. She understood this well and will start decreasing her dose. 2-4 weeks is empirically adequate high dose treatment and we can check labs at her follow up in 2 weeks for improvement in inflammatory markers.

## 2018-03-25 NOTE — Telephone Encounter (Addendum)
Patient called in requesting taper instructions for prednisone. States she's been taking 60 mg daily X 3 weeks and "can barely function." Has constant dizziness and no energy. Saw Dr. Trula Slade today who suggested she contact her PCP for prednisone taper instructions. Will route to Dr. Benjamine Mola who saw patient on 03/23/2018 and Attending pool as PCP unavailable at this time. Hubbard Hartshorn, RN, BSN

## 2018-03-26 ENCOUNTER — Ambulatory Visit (INDEPENDENT_AMBULATORY_CARE_PROVIDER_SITE_OTHER): Payer: Medicare Other | Admitting: Cardiovascular Disease

## 2018-03-26 ENCOUNTER — Encounter: Payer: Self-pay | Admitting: Cardiovascular Disease

## 2018-03-26 VITALS — BP 138/66 | HR 76 | Ht 64.0 in | Wt 131.0 lb

## 2018-03-26 DIAGNOSIS — I4589 Other specified conduction disorders: Secondary | ICD-10-CM | POA: Diagnosis not present

## 2018-03-26 DIAGNOSIS — R5383 Other fatigue: Secondary | ICD-10-CM | POA: Diagnosis not present

## 2018-03-26 DIAGNOSIS — I6529 Occlusion and stenosis of unspecified carotid artery: Secondary | ICD-10-CM | POA: Diagnosis not present

## 2018-03-26 DIAGNOSIS — E7800 Pure hypercholesterolemia, unspecified: Secondary | ICD-10-CM

## 2018-03-26 DIAGNOSIS — I208 Other forms of angina pectoris: Secondary | ICD-10-CM

## 2018-03-26 DIAGNOSIS — I10 Essential (primary) hypertension: Secondary | ICD-10-CM | POA: Diagnosis not present

## 2018-03-26 MED ORDER — IRBESARTAN 75 MG PO TABS: 75.0000 mg | ORAL_TABLET | Freq: Every day | ORAL | 5 refills | Status: DC

## 2018-03-26 NOTE — Progress Notes (Signed)
Internal Medicine Clinic Attending  Case discussed with Dr. Rice at the time of the visit.  We reviewed the resident's history and exam and pertinent patient test results.  I agree with the assessment, diagnosis, and plan of care documented in the resident's note.  

## 2018-03-26 NOTE — Progress Notes (Signed)
Cardiology Office Note   Date:  03/26/2018   ID:  Christine [name], DOB 01/24/1934, MRN [id number]  PCP:  [name], MD  Cardiologist:   Skeet Latch, MD   Chief Complaint  Patient presents with  . Follow-up     History of Present Illness: Christine [name] is an 84 y.o. female with CAD, carotid stenosis, hypertension, hyperlipidemia, hypothyroidism, temporal arteritis, and diabetes who presents for follow up.  Christine [name] was previously a patient of Dr. Aundra Dubin.  She initially established care 08/2011 when she presented with chest pain. She had a left heart catheterization that revealed 40-50% left main stenosis and a 75% diagonal lesion that were medically managed.  She had a repeat cath 09/2015 that was unchanged.  CPX 10/2015 revealed mild obstructive pulmonary disease and severe chronotropic incompetence.  Metoprolol was decreased.  There was also concern for possible exercise-induced pulmonary hypertension or functional MR.  Christine [name] had a 48 hour Holter 03/2016 that showed occasional PACs and PVCs.  In 2017 she had orthostatic hypotension.  She was started on compression stockings and pyridostigmine. She did not tolerate this due to dizziness.  She saw Rosaria Ferries, PA-C, on 07/2017 and reported exertional dyspnea.  She also noted mild chest pain.  She was referred for heart catheterization that revealed no significant changes from prior.  She had 40% left main, 70% ostial D1, and 10% proximal RCA disease.  LVEF was 55-65%.  LVEDP was normal.  Echo 11/07/17 revealed LVEF 65-70% and grade 1 diastolic dysfunction.  PASP was 40 mmHg.  She had aortic valve sclerosis without stenosis.  Christine [name] followed up with Dr. Vaughan Browner on 09/15/17 and continued to report shortness of breath and fatigue.  She was referred for a CPX 09/26/17 that again revealed chronotropic incompetence.  Her heart rate increased from 61 bpm at rest to 87 at max.  Between seeing her pulmonologist and actually having the CPX  her metoprolol was increased to 25 mg daily.  At her last appointment she continued to have significant fatigue with exertion. Metoprolol was discontinued.  Since her last appointment [name] was diagnosed with temporal arteritis.  Her fatigue has been significantly improved.  She was having daily headaches that still persist but are much better.  She also had a hand injections for arthritis and has had much better use of her hands.  She is no longer feeling a short of breath or tired.  She does continue to struggle with dizziness.  Her appetite has been poor but she is trying to increase her intake of fluids.  She feels dizzy when sitting but gets worse upon standing and better when she lies down.  She has not been checking her blood pressure at home.   Past Medical History:  Diagnosis Date  . Anemia   . Anginal pain ([place])   . Arthritis    "back, arms, hips; hands" (11/30/2015)  . Benign hypertensive heart and kidney disease with diastolic CHF, NYHA class II and CKD stage III ([place])   . Blood transfusion 1972   "after daughter born, attempted to give me blood; couldn't give it cause my blood was cold" (09/23/2013)  . CAD (coronary artery disease)    a. LHC 08/23/11: dLM 40-50%, oRI 40%, oCFX 40%, oD1 70% (small and not amenable to PCI).  LM lesion did not appear to be flow limiting.  Medical rx was recommended;  b. Echo 08/23/11: mild LVH, EF 60-65%, grade 1 diast dysfxn, mild  BAE, PASP 24;  c. 02/2012  Cath: LM 50d, LAD 50p, D1 70ost, RI 70p, RCA ok->Med Rx;  d. 01/2013 Cardiolite: EF 88, no ischemia/infarct.  . Carotid stenosis    a. dopplers 10/12:  0-39% bilat ICA;  b. 10/2012 U/S: 0-39% bilat, f/u 1 yr (10/2013).  . Chronic bronchitis   . Depression   . Diverticulosis of colon (without mention of hemorrhage)   . GERD (gastroesophageal reflux disease)   . Heart murmur, systolic    2-D echo in December 2011 showed a normal EF with grade 1 diastolic dysfunction, trivial pulmonary regurgitation and  mildly elevated PA pressure at 37 mmHg probably secondary to her COPD.dynamic obstruction-mid cavity obliteration;  b. 02/2012 Echo: EF 60-65%, mild LVH, PASP 72mmHg.  Marland Kitchen History of stomach ulcers 1970's  . Hyperlipidemia   . Hypertension   . Hypothyroidism   . Migraines    a. Next atypical symptoms in the past. Patient was started on Neurontin for possible neuropathic origin of her pain.  . Polymyalgia rheumatica ([place])   . Shortness of breath on exertion    "just related to angina >1 yr ago" (09/23/2013)  . Sinus arrhythmia   . Type II diabetes mellitus ([place])    a. On oral hypoglycemic agents.    Past Surgical History:  Procedure Laterality Date  . CARDIAC CATHETERIZATION N/A 09/26/2015   Procedure: Right/Left Heart Cath and Coronary Angiography;  Surgeon: Larey Dresser, MD;  Location: [facility];  Service: Cardiovascular;  Laterality: N/A;  . CATARACT EXTRACTION W/ INTRAOCULAR LENS  IMPLANT, BILATERAL Bilateral 1990's  . LEFT HEART CATHETERIZATION WITH CORONARY ANGIOGRAM N/A 03/16/2012   Procedure: LEFT HEART CATHETERIZATION WITH CORONARY ANGIOGRAM;  Surgeon: Hillary Bow, MD;  Location: [facility];  Service: Cardiovascular;  Laterality: N/A;  . RIGHT/LEFT HEART CATH AND CORONARY ANGIOGRAPHY N/A 08/12/2017   Procedure: RIGHT/LEFT HEART CATH AND CORONARY ANGIOGRAPHY;  Surgeon: Martinique, Peter M, MD;  Location: [facility];  Service: Cardiovascular;  Laterality: N/A;  . VAGINAL HYSTERECTOMY  1976     Current Outpatient Medications  Medication Sig Dispense Refill  . acetaminophen (TYLENOL) 500 MG tablet Take 1,000 mg by mouth every 6 (six) hours as needed for headache. For pain    . ADVAIR DISKUS 250-50 MCG/DOSE AEPB Inhale 1 puff into the lungs 2 (two) times daily.   7  . albuterol (PROVENTIL HFA;VENTOLIN HFA) 108 (90 Base) MCG/ACT inhaler Inhale 1-2 puffs into the lungs every 6 (six) hours as needed for wheezing or shortness of breath. 18 g 2  . amLODipine (NORVASC) 10  MG tablet take 1 tablet by mouth once daily 90 tablet 3  . Ascorbic Acid (VITAMIN C PO) Take 1 tablet by mouth daily.     Marland Kitchen atorvastatin (LIPITOR) 40 MG tablet Take 0.5 tablets (20 mg total) by mouth every evening. 90 tablet 1  . clopidogrel (PLAVIX) 75 MG tablet take 1 tablet by mouth once daily EVERY EVENING 30 tablet 5  . esomeprazole (NEXIUM) 20 MG capsule Take 1 capsule (20 mg total) by mouth daily with breakfast. 90 capsule 3  . fluticasone (FLONASE) 50 MCG/ACT nasal spray Place 1 spray into both nostrils daily. 16 g 2  . glimepiride (AMARYL) 1 MG tablet take 1 tablet by mouth once daily BEFORE BREAKFAST 90 tablet 3  . imipramine (TOFRANIL) 50 MG tablet Take 1 tablet (50 mg total) by mouth 2 (two) times daily. 180 tablet 1  . irbesartan (AVAPRO) 75 MG tablet Take  1 tablet (75 mg total) by mouth daily. 30 tablet 5  . levothyroxine (SYNTHROID, LEVOTHROID) 25 MCG tablet Take 1 tablet (25 mcg total) by mouth daily. 90 tablet 1  . metoprolol tartrate (LOPRESSOR) 25 MG tablet Take 25 mg by mouth 2 (two) times daily.    . Multiple Vitamin (MULTIVITAMIN WITH MINERALS) TABS tablet Take 1 tablet by mouth daily. 30 tablet 2  . nitroGLYCERIN (NITROSTAT) 0.4 MG SL tablet place 1 tablet under the tongue if needed every 5 minutes for chest pain for 3 doses IF NO RELIEF AFTER FIRST DOSE CALL PRESCRIBER OR 911. 25 tablet 3  . ONE TOUCH ULTRA TEST test strip 1 each by Other route 2 (two) times daily. 100 each 2  . polycarbophil (FIBERCON) 625 MG tablet Take 1 tablet (625 mg total) by mouth daily. 30 tablet 2  . promethazine (PHENERGAN) 12.5 MG tablet take 1 tablet by mouth every 6 hours if needed for nausea and vomiting 30 tablet 6  . senna (SENOKOT) 8.6 MG TABS tablet Take 1 tablet (8.6 mg total) by mouth at bedtime as needed for mild constipation. 120 each 1   No current facility-administered medications for this visit.     Allergies:   Aspirin; Atarax [hydroxyzine]; Nsaids; Codeine; Haldol [haloperidol];  Other; and Penicillins    Social History:  The patient  reports that she has never smoked. She has never used smokeless tobacco. She reports that she does not drink alcohol or use drugs.   Family History:  The patient's family history includes COPD in her father; Colon cancer in her maternal grandmother; Heart attack in her maternal grandmother; Other in her mother; Pulmonary Hypertension in her daughter.    ROS:  Please see the history of present illness.   Otherwise, review of systems are positive for none.   All other systems are reviewed and negative.    PHYSICAL EXAM: VS:  BP 138/66   Pulse 76   Ht 5\' 4"  (1.626 m)   Wt 131 lb (59.4 kg)   LMP 02/11/1975   BMI 22.49 kg/m  , BMI Body mass index is 22.49 kg/m. GENERAL:  Well appearing HEENT: Pupils equal round and reactive, fundi not visualized, oral mucosa unremarkable NECK:  No jugular venous distention, waveform within normal limits, carotid upstroke brisk and symmetric, no bruits LUNGS:  Clear to auscultation bilaterally HEART:  RRR.  PMI not displaced or sustained,S1 and S2 within normal limits, no S3, no S4, no clicks, no rubs, no murmurs ABD:  Flat, positive bowel sounds normal in frequency in pitch, no bruits, no rebound, no guarding, no midline pulsatile mass, no hepatomegaly, no splenomegaly EXT:  2 plus pulses throughout, no edema, no cyanosis no clubbing SKIN:  No rashes no nodules NEURO:  Cranial nerves II through XII grossly intact, motor grossly intact throughout PSYCH:  Cognitively intact, oriented to person place and time   EKG:  EKG is not ordered today. 10/22/17: Sinus rhythm.  Rate 72 bpm.  Prior septal infarct.  LAD.   48 hour Holter 04/12/16: Occasional PACs and PVCs, no worrisome arrhythmias.  CPX 09/26/17: Exercise testing with gas exchange demonstrates moderately reduced functional impairment when compared to matched sedentary norms. There is mild indication for circulatory limitation given elevated  VE/VCO2 slope. There was significant chronotropic incompetence. Despite patient's symptoms, results have mildly increased and improved with similar limitations as CPX in 2016.  Heart rate increased from 61 bpm at baseline to 87 bpm at peak stress.  RHC/LHC 08/12/17:  Ost LM lesion, 40 %stenosed.  LM lesion, 40 %stenosed.  Ost 1st Diag to 1st Diag lesion, 70 %stenosed.  Prox RCA to Mid RCA lesion, 10 %stenosed.  The left ventricular systolic function is normal.  LV end diastolic pressure is normal.  The left ventricular ejection fraction is 55-65% by visual estimate.  LV end diastolic pressure is normal.   1. Nonobstructive CAD. Unchanged from 2013 and 2016.  2. Normal LV function 3. Normal LV filling pressures 4. Normal right heart pressures  Echo 09/19/15:  Study Conclusions  - Left ventricle: The cavity size was normal. Wall thickness was   increased in a pattern of moderate LVH. Systolic function was   normal. The estimated ejection fraction was in the range of 60%   to 65%. Wall motion was normal; there were no regional wall   motion abnormalities. Left ventricular diastolic function   parameters were normal. - Atrial septum: No defect or patent foramen ovale was identified.  48 Hour Holter Monitor 12/01/16:  Quality: Fair.  Baseline artifact. Predominant rhythm: sinus rhythm Average heart rate: 83 bpm Max heart rate: 68 bpm Min heart rate: 109 bpm  PACs and PVCs noted <1% PVCs Atrial bigeminy  Recent Labs: 08/20/2017: TSH 2.792 11/26/2017: ALT 14 03/07/2018: BUN 21; Creatinine, Ser 1.19; Hemoglobin 12.0; Platelets 373; Potassium 4.7; Sodium 134    Lipid Panel    Component Value Date/Time   CHOL 146 06/13/2017 0804   TRIG 268 (H) 06/13/2017 0804   HDL 46 06/13/2017 0804   CHOLHDL 3.2 06/13/2017 0804   CHOLHDL 4 08/05/2014 0933   VLDL 33.8 08/05/2014 0933   LDLCALC 46 06/13/2017 0804      Wt Readings from Last 3 Encounters:  03/26/18 131 lb (59.4 kg)   03/25/18 127 lb (57.6 kg)  03/23/18 130 lb 8 oz (59.2 kg)      ASSESSMENT AND PLAN:  # Exertional dyspnea:  # Chronotropic incompetence:  Heart rate stable and she is doing well.  Continue metoprolol.   # Medically managed CAD:  Ms. Garman has known left main and diagonal lesions that are medically managed and unchanged from 2013. Her symptoms are not consistent with angina.  Continue amlodipine, metoprolol, clopidogrel, atorvastatin.   # Hypertension: Blood pressure is above goal.  She has orthostatic symptoms.  We discussed increasing her oral intake.  We will try reducing irbesartan to 75 mg daily to see if that helps.  If she remains dizzy at higher blood pressures we should increase this back.  For now her goal is less than 140/90.  Continue amlodipine and metoprolol.   # Hyperlipidemia: LDL 46 on 05/2017.  Triglycerides are elevated.  Continue atorvastatin.      Current medicines are reviewed at length with the patient today.  The patient does not have concerns regarding medicines.  The following changes have been made:  Reduce irbesartan to 75mg  daily.   Labs/ tests ordered today include:   No orders of the defined types were placed in this encounter.   Disposition:   FU with Iasha Mccalister C. Oval Linsey, MD, [facility] in 4 months. PHarmD in 1 month.     Signed, Amun Stemm C. Oval Linsey, MD, [facility]  03/26/2018 4:28 PM    [facility]

## 2018-03-26 NOTE — Patient Instructions (Signed)
Medication Instructions:  DECREASE YOUR IRBESARTAN TO 75 MG DAILY   Labwork: NONE  Testing/Procedures: NONE  Follow-Up: Your physician recommends that you schedule a follow-up appointment in: [place] physician recommends that you schedule a follow-up appointment in: [facility] IN 4 MONTHS   If you need a refill on your cardiac medications before your next appointment, please call your pharmacy.

## 2018-04-06 ENCOUNTER — Encounter: Payer: Self-pay | Admitting: Internal Medicine

## 2018-04-06 ENCOUNTER — Ambulatory Visit (INDEPENDENT_AMBULATORY_CARE_PROVIDER_SITE_OTHER): Payer: Medicare Other | Admitting: Internal Medicine

## 2018-04-06 VITALS — BP 142/59 | HR 86 | Temp 98.8°F | Ht 64.0 in | Wt 132.8 lb

## 2018-04-06 DIAGNOSIS — Z7902 Long term (current) use of antithrombotics/antiplatelets: Secondary | ICD-10-CM | POA: Diagnosis not present

## 2018-04-06 DIAGNOSIS — Z7952 Long term (current) use of systemic steroids: Secondary | ICD-10-CM

## 2018-04-06 DIAGNOSIS — E119 Type 2 diabetes mellitus without complications: Secondary | ICD-10-CM

## 2018-04-06 DIAGNOSIS — Z8739 Personal history of other diseases of the musculoskeletal system and connective tissue: Secondary | ICD-10-CM

## 2018-04-06 DIAGNOSIS — M316 Other giant cell arteritis: Secondary | ICD-10-CM | POA: Diagnosis not present

## 2018-04-06 HISTORY — DX: Personal history of other diseases of the musculoskeletal system and connective tissue: Z87.39

## 2018-04-06 MED ORDER — PREDNISONE 5 MG PO TABS: ORAL_TABLET | ORAL | 2 refills | Status: DC

## 2018-04-06 MED ORDER — PREDNISONE 10 MG PO TABS: ORAL_TABLET | ORAL | 0 refills | Status: AC

## 2018-04-06 NOTE — Patient Instructions (Addendum)
I recommend you continue to decrease your prednisone by 10mg  per week until you are taking 20mg  daily. After this you will need to slow down the rate of decreasing prednisone to 5mg  at a time every 2 weeks. If your headache or eye problems come back please call our clinic and you will likely need to go back to the previous higher dose of prednisone to control symptoms.  I think we can continue to monitor your blood sugars for now until you get to a lower dose of prednisone. If they are not much better after getting to a lower dose we may need to start more medicine such as insulin when you are seen next time.

## 2018-04-06 NOTE — Assessment & Plan Note (Signed)
Her initial presentation in April was concerning for temporal arteritis with right sided eye involvement.  These headaches remain improved while starting to taper her steroids down since 5/9 and she is currently at 40 mg/day.  By my review her inflammatory markers were not very impressive at the presentation in April.  This makes me less confident in the diagnosis based on initial studies.  After meeting with Dr. Trula Slade and speaking with our clinic she and her daughter do not feel that they wish to pursue temporal artery biopsy for definitive diagnosis.  They state is based on having previously been told she has temporal arteritis 10 years ago and feel they wish to proceed with treatment for this and then undergoing a procedure would not change their minds much.  Plan: Continue steroid taper at 10 mg/week down to 20 mg daily After 20 mg daily with slow taper to 5 mg every 2 weeks and go back up if headache or right eye symptoms recur Can follow-up with her PCP in June

## 2018-04-06 NOTE — Assessment & Plan Note (Signed)
Her blood glucose is ranging from normal in the mornings to elevated in the 300s some evenings due to the high-dose steroids.  As a result she would risk hypoglycemia with addition of long-acting medicine and would need a short acting treatment to cover the prandial glucose increases.   I recommended that short acting insulin would be the easiest way to control her blood sugars until she can get off the steroids.  After discussion with her daughter she wishes to proceed with the steroid taper and reassess the need for introducing new diabetes medicines when she follows up on a lower dose.

## 2018-04-06 NOTE — Progress Notes (Signed)
CC: Follow up for dizziness, temporal arteritis  HPI:  Ms.Tahtiana R Trantham is a 84 y.o. female with PMHx detailed below presenting as a follow up after 2 weeks tapering steroids for giant cell arteritis and with elevated blood sugars.  She has had no recurrence of headaches or eye trouble but has a persistent dizziness that gets worse when walking and standing.  See problem based assessment and plan below for additional details.  Temporal arteritis ([place]) Her initial presentation in April was concerning for temporal arteritis with right sided eye involvement.  These headaches remain improved while starting to taper her steroids down since 5/9 and she is currently at 40 mg/day.  By my review her inflammatory markers were not very impressive at the presentation in April.  This makes me less confident in the diagnosis based on initial studies.  After meeting with Dr. Trula Slade and speaking with our clinic she and her daughter do not feel that they wish to pursue temporal artery biopsy for definitive diagnosis.  They state is based on having previously been told she has temporal arteritis 10 years ago and feel they wish to proceed with treatment for this and then undergoing a procedure would not change their minds much.  Plan: Continue steroid taper at 10 mg/week down to 20 mg daily After 20 mg daily with slow taper to 5 mg every 2 weeks and go back up if headache or right eye symptoms recur Can follow-up with her PCP in June  Type 2 diabetes mellitus, controlled ([place]) Her blood glucose is ranging from normal in the mornings to elevated in the 300s some evenings due to the high-dose steroids.  As a result she would risk hypoglycemia with addition of long-acting medicine and would need a short acting treatment to cover the prandial glucose increases.   I recommended that short acting insulin would be the easiest way to control her blood sugars until she can get off the steroids.  After discussion  with her daughter she wishes to proceed with the steroid taper and reassess the need for introducing new diabetes medicines when she follows up on a lower dose.   Past Medical History:  Diagnosis Date  . Anemia   . Anginal pain ([place])   . Arthritis    "back, arms, hips; hands" (11/30/2015)  . Benign hypertensive heart and kidney disease with diastolic CHF, NYHA class II and CKD stage III ([place])   . Blood transfusion 1972   "after daughter born, attempted to give me blood; couldn't give it cause my blood was cold" (09/23/2013)  . CAD (coronary artery disease)    a. LHC 08/23/11: dLM 40-50%, oRI 40%, oCFX 40%, oD1 70% (small and not amenable to PCI).  LM lesion did not appear to be flow limiting.  Medical rx was recommended;  b. Echo 08/23/11: mild LVH, EF 60-65%, grade 1 diast dysfxn, mild BAE, PASP 24;  c. 02/2012  Cath: LM 50d, LAD 50p, D1 70ost, RI 70p, RCA ok->Med Rx;  d. 01/2013 Cardiolite: EF 88, no ischemia/infarct.  . Carotid stenosis    a. dopplers 10/12:  0-39% bilat ICA;  b. 10/2012 U/S: 0-39% bilat, f/u 1 yr (10/2013).  . Chronic bronchitis   . Depression   . Diverticulosis of colon (without mention of hemorrhage)   . GERD (gastroesophageal reflux disease)   . Heart murmur, systolic    2-D echo in December 2011 showed a normal EF with grade 1 diastolic dysfunction, trivial pulmonary regurgitation and mildly elevated  PA pressure at 37 mmHg probably secondary to her COPD.dynamic obstruction-mid cavity obliteration;  b. 02/2012 Echo: EF 60-65%, mild LVH, PASP 80mmHg.  Marland Kitchen History of stomach ulcers 1970's  . Hyperlipidemia   . Hypertension   . Hypothyroidism   . Migraines    a. Next atypical symptoms in the past. Patient was started on Neurontin for possible neuropathic origin of her pain.  . Polymyalgia rheumatica ([place])   . Shortness of breath on exertion    "just related to angina >1 yr ago" (09/23/2013)  . Sinus arrhythmia   . Type II diabetes mellitus ([place])    a. On oral hypoglycemic  agents.    Review of Systems: Review of Systems  Constitutional: Negative for chills and fever.  HENT: Negative for tinnitus.   Eyes: Negative for blurred vision and pain.  Respiratory: Negative for shortness of breath.   Cardiovascular: Negative for chest pain and leg swelling.  Skin: Negative for rash.  Neurological: Positive for dizziness. Negative for sensory change.     Physical Exam: Vitals:   04/06/18 0856  BP: (!) 142/59  Pulse: 86  Temp: 98.8 F (37.1 C)  TempSrc: Oral  SpO2: 100%  Weight: 132 lb 12.8 oz (60.2 kg)  Height: 5\' 4"  (1.626 m)   GENERAL- alert, co-operative, NAD HEENT- EOMI, no nystagmus, negative dix-hallpike bilaterally, oral mucosa is moist CARDIAC- RRR, no murmurs, rubs or gallops. RESP- CTAB, no wheezes or crackles. NEURO- Sensation intact globally EXTREMITIES- Symmetric, no pedal edema. SKIN- Warm, dry, No rash or lesion. PSYCH- Normal mood and affect, appropriate thought content and speech.   Assessment & Plan:   See encounters tab for problem based medical decision making.   Patient discussed with Dr. Dareen Piano

## 2018-04-07 NOTE — Progress Notes (Signed)
Internal Medicine Clinic Attending  Case discussed with Dr. Rice at the time of the visit.  We reviewed the resident's history and exam and pertinent patient test results.  I agree with the assessment, diagnosis, and plan of care documented in the resident's note.  

## 2018-04-08 ENCOUNTER — Telehealth: Payer: Self-pay | Admitting: Pharmacist

## 2018-04-09 NOTE — Progress Notes (Signed)
Contacted patient to help with home BG monitoring while on prednisone therapy. Patient states morning BG prior to breakfast are 80s-100s. Afternoon BG sometimes are 200s/300s; however, patient reports checking BG immediately after eating. Advised patient to wait at least 2 hours after eating, or to switch to checking afternoon BG prior to lunch or dinner. Patient verbalized understanding by repeat back. Will call back for follow up.

## 2018-04-15 NOTE — Progress Notes (Signed)
Unable to reach patient for further follow up

## 2018-04-23 ENCOUNTER — Ambulatory Visit: Payer: Medicare Other

## 2018-04-27 ENCOUNTER — Other Ambulatory Visit: Payer: Self-pay | Admitting: Internal Medicine

## 2018-04-27 NOTE — Telephone Encounter (Signed)
Next appt scheduled 6/12 with PCP.   

## 2018-04-29 ENCOUNTER — Other Ambulatory Visit: Payer: Self-pay

## 2018-04-29 ENCOUNTER — Encounter: Payer: Self-pay | Admitting: Internal Medicine

## 2018-04-29 ENCOUNTER — Encounter (INDEPENDENT_AMBULATORY_CARE_PROVIDER_SITE_OTHER): Payer: Self-pay

## 2018-04-29 ENCOUNTER — Ambulatory Visit (INDEPENDENT_AMBULATORY_CARE_PROVIDER_SITE_OTHER): Payer: Medicare Other | Admitting: Internal Medicine

## 2018-04-29 VITALS — BP 156/58 | HR 82 | Temp 98.8°F | Ht 64.0 in | Wt 132.1 lb

## 2018-04-29 DIAGNOSIS — I25119 Atherosclerotic heart disease of native coronary artery with unspecified angina pectoris: Secondary | ICD-10-CM

## 2018-04-29 DIAGNOSIS — I10 Essential (primary) hypertension: Secondary | ICD-10-CM

## 2018-04-29 DIAGNOSIS — M8588 Other specified disorders of bone density and structure, other site: Secondary | ICD-10-CM | POA: Diagnosis not present

## 2018-04-29 DIAGNOSIS — E119 Type 2 diabetes mellitus without complications: Secondary | ICD-10-CM | POA: Diagnosis not present

## 2018-04-29 DIAGNOSIS — I6529 Occlusion and stenosis of unspecified carotid artery: Secondary | ICD-10-CM

## 2018-04-29 DIAGNOSIS — M316 Other giant cell arteritis: Secondary | ICD-10-CM

## 2018-04-29 LAB — POCT GLYCOSYLATED HEMOGLOBIN (HGB A1C): Hemoglobin A1C: 8.1 % — AB (ref 4.0–5.6)

## 2018-04-29 LAB — GLUCOSE, CAPILLARY: Glucose-Capillary: 166 mg/dL — ABNORMAL HIGH (ref 65–99)

## 2018-04-29 NOTE — Assessment & Plan Note (Signed)
She is only taking a multivitamin.  Given prolonged course of steroids, I asked her to start taking vitamin D daily as well.  I would avoid excess calcium given her CAD.   Consider repeat DEXA this year if she remains on steroids long term.

## 2018-04-29 NOTE — Progress Notes (Signed)
   Subjective:    [name], female    DOB: 01/24/1934, 84 y.o.   MRN: [id number]  1 month follow up for presumed temporal arteritis.  HPI  Christine [name] is an 84yo woman with [place] of DM2 (previously well controlled), CAD, chronic cough, HTN, HLD, hypothyroidism, osteopenia who presents for follow up.    Christine [name] was recently seen for headache which was thought to be temporal arteritis.  She has a history of PMR diagnosed many years ago, however, follow up with rheumatology on multiple occasions, and these providers did not feel she met criteria.  When I first met her, she was treated for this with steroids intermittently.  This time, her ESR and CRP were elevated, she was started on a course of steroids.  She declined biopsy.  Today, she has weaned down to a steroid dose of 93m daily and is doing well.  She has had only twinges of headaches which last seconds and otherwise feels okay.    She does report dizziness which is chronic in nature for her.  She has been off and on medications, had issues with orthostasis in the past.  She is curious if the irbesartan can be worsening symptoms as she feels that the symptoms have been worse since changing over.  She wants to go back on Diovan since she feels her symptoms were better controlled with this medication (appears last Rx in our system was for 862m.  Her BP was elevated today on check, but she notes that the systolic is 13025Kt home.  Given she has issues with orthostasis, I did not make any other changes to her BP medications today.    She had a hand written BS log which I reviewed.  Her AM blood sugars are routinely below 150.  Her evening BS however are in the 200s - 400s given she takes the steroid in the AM.  She is only on glimepiride.  Last A1C was 6.7 on 2/27 of this year.  She is due for repeat.    We discussed her osteopenia and how steroids could make her bone density worse.    Review of Systems  Constitutional: Negative  for activity change, appetite change and fever.  Respiratory: Negative for cough and shortness of breath.   Cardiovascular: Negative for chest pain and leg swelling.  Musculoskeletal: Negative for arthralgias and back pain.  Neurological: Positive for dizziness and headaches. Negative for syncope.       Objective:   Physical Exam  Constitutional: She is oriented to person, place, and time. She appears well-developed and well-nourished. No distress.  Cardiovascular: Normal rate and regular rhythm.  Pulmonary/Chest: Effort normal and breath sounds normal.  Neurological: She is alert and oriented to person, place, and time.  Skin: Skin is warm and dry.  Psychiatric: She has a normal mood and affect.  Nursing note and vitals reviewed.   CRP and ESR today.  A1C today.       Assessment & Plan:  RTC in 4 weeks for BP check and to follow up on prednisone taper.

## 2018-04-29 NOTE — Assessment & Plan Note (Signed)
Checked A1C today given prolonged course of steroids.  She does have an elevated A1C to 8.1 today.  Given age, her A1C goal would be < 8 so I will continue to monitor at this time.    Plan Follow up in 4 weeks, continue to wean steroids.

## 2018-04-29 NOTE — Assessment & Plan Note (Signed)
She denies any chest pain today or other symptoms.  She does have fatigue, but this is attributed to her PMR diagnosis at this time.   Continue plavix, good BP control.

## 2018-04-29 NOTE — Assessment & Plan Note (Signed)
BP elevated today to 156/58.  She notes a recent decrease in her irbesartan dose.  She also notes that her BP is around 677 systolic at home.  She is interested in going back on valsartan as she feels that it does not cause as much dizziness for her.  She has multifactorial chronic dizziness.  She has an Rx for 80mg  of valsartan.   Plan Change back to valsartan, she will call if worsening symptoms or elevated BP Continue amlodipine.

## 2018-04-29 NOTE — Assessment & Plan Note (Signed)
Sx seem much improved.  She is happy with the prednisone therapy.  She has decreased to 15mg  daily and is feeling well.  She has had high sugars in the evening and her A1C is now elevated.  We also discussed the issues with coming off of steroids and some possible symptoms of adrenal insufficiency.  I have asked her to call the clinic if any of these should occur.  We also discussed the possibility of worsening of her osteopenia and I asked her to start taking vitamin D.   Plan Continue steroid taper, call clinic if any signs/symptoms of adrenal insufficiency surface.

## 2018-04-29 NOTE — Patient Instructions (Addendum)
Christine [name] - -  Please keep track of your blood pressure when you switch to Diovan.  If your blood pressure is too low or too high, please call the clinic.    Please continue the taper of your steroids.   You might find that you start to feel worse when you go down to the 5mg  of prednisone dose.  If you start to develop any of the following symptoms, please call the clinic.    What are the signs or symptoms?   Severe fatigue.  Severe Muscle weakness.  Loss of appetite.  Weight loss.  Darkening of the skin.  Drops in blood pressure.  Other symptoms include:  Nausea or vomiting.  Diarrhea.  Severe Dizziness or fainting.  Irritability.  Salt cravings.  Low blood sugar (hypoglycemia).   Make an appointment in 4 weeks.    Thank you!

## 2018-04-30 LAB — SEDIMENTATION RATE: Sed Rate: 7 mm/hr (ref 0–40)

## 2018-04-30 LAB — C-REACTIVE PROTEIN: CRP: 32.1 mg/L — ABNORMAL HIGH (ref 0.0–4.9)

## 2018-05-01 ENCOUNTER — Telehealth: Payer: Self-pay | Admitting: Internal Medicine

## 2018-05-01 NOTE — Telephone Encounter (Signed)
Attempted to call Christine [name].  Left VM to call back.    [name] - -  Could you please inform [name] -  You're sed rate is improved.  You other inflammatory marker (CRP) is still elevated.  Please continue your steroid taper as we discussed.   Thank you!

## 2018-05-01 NOTE — Telephone Encounter (Signed)
Pt called informed "You're sed rate is improved.  You other inflammatory marker (CRP) is still elevated.  Please continue your steroid taper as we discussed." per Dr Daryll Drown. Voiced ok and wanted to know  sed rate which is 7 and CRP is 32.1.

## 2018-05-13 ENCOUNTER — Telehealth: Payer: Self-pay | Admitting: Gastroenterology

## 2018-05-13 NOTE — Telephone Encounter (Signed)
Spoke to patient she is having difficulty with hemorrhoids, requesting to see Dr. Havery Moros. He does not have any available appointments until end of August. Scheduled with AE for tomorrow as it has been almost one year since she has been seen.

## 2018-05-13 NOTE — Telephone Encounter (Signed)
Patient who was last seen 07.2018 states she is having problems with hemorrhoids and would like some advice.

## 2018-05-14 ENCOUNTER — Ambulatory Visit (INDEPENDENT_AMBULATORY_CARE_PROVIDER_SITE_OTHER): Payer: Medicare Other | Admitting: Physician Assistant

## 2018-05-14 ENCOUNTER — Encounter: Payer: Self-pay | Admitting: Physician Assistant

## 2018-05-14 ENCOUNTER — Telehealth: Payer: Self-pay | Admitting: *Deleted

## 2018-05-14 VITALS — BP 126/54 | HR 70 | Ht 64.0 in | Wt 131.0 lb

## 2018-05-14 DIAGNOSIS — K625 Hemorrhage of anus and rectum: Secondary | ICD-10-CM | POA: Diagnosis not present

## 2018-05-14 DIAGNOSIS — I6529 Occlusion and stenosis of unspecified carotid artery: Secondary | ICD-10-CM

## 2018-05-14 DIAGNOSIS — K6289 Other specified diseases of anus and rectum: Secondary | ICD-10-CM | POA: Diagnosis not present

## 2018-05-14 DIAGNOSIS — K623 Rectal prolapse: Secondary | ICD-10-CM | POA: Diagnosis not present

## 2018-05-14 DIAGNOSIS — R159 Full incontinence of feces: Secondary | ICD-10-CM

## 2018-05-14 NOTE — Progress Notes (Signed)
Agree with assessment and plan as outlined. If she holds plavix for this procedure okay to take an aspirin in its place. Given her age / comorbidities, if colonoscopy shows nothing other than rectal prolapse may consider referral to pelvic floor PT prior to surgical evaluation initially

## 2018-05-14 NOTE — Progress Notes (Signed)
Subjective:    [name], female    DOB: 01/24/1934, 84 y.o.   MRN: [id number]  HPI August is a very nice 84 year old African-American female, known to Dr. Havery Moros who was last seen in the office about a year ago with complaints of GERD.  She comes in today with new complaint of intermittent rectal bleeding, rectal pressure ,and fecal incontinence. Patient has history of hypertension, coronary artery disease for which she is on chronic Plavix, history of temporal arteritis on steroids, hypothyroidism, adult onset diabetes mellitus, diverticulosis, depression, and chronic kidney disease. She last had colonoscopy in November 2009 per Dr. Deatra Ina and at that time was noted to have left-sided diverticulosis and otherwise negative exam with no internal hemorrhoids noted. Patient says she is been having significant difficulty since March 2019 with intermittent small-volume rectal bleeding which is just been bright red blood on the tissue.  This had become fairly frequent and is occurring just with bowel movements.  He has also noted that her stools have been looser and she is having 2-3 bowel movements per day.  She is also developed significant leakage off and on and has been wearing a pad.  She says she does get a sense of need for bowel movement but in between may have seepage of stool that she is not aware of until it occurs. So complains of rectal pressure and discomfort internally over the past couple of months.  On further questioning she has felt some protrusion from her rectum which she says she has pushed back in several times. Family history is pertinent for grandmother with colon cancer.  Review of Systems;Pertinent positive and negative review of systems were noted in the above HPI section.  All other review of systems was otherwise negative.  Outpatient Encounter Medications as of 05/14/2018  Medication Sig  . acetaminophen (TYLENOL) 500 MG tablet Take 1,000 mg by mouth  every 6 (six) hours as needed for headache. For pain  . ADVAIR DISKUS 250-50 MCG/DOSE AEPB Inhale 1 puff into the lungs 2 (two) times daily.   Marland Kitchen albuterol (PROVENTIL HFA;VENTOLIN HFA) 108 (90 Base) MCG/ACT inhaler Inhale 1-2 puffs into the lungs every 6 (six) hours as needed for wheezing or shortness of breath.  Marland Kitchen amLODipine (NORVASC) 10 MG tablet take 1 tablet by mouth once daily  . Ascorbic Acid (VITAMIN C PO) Take 1 tablet by mouth daily.   Marland Kitchen atorvastatin (LIPITOR) 40 MG tablet Take 0.5 tablets (20 mg total) by mouth every evening.  . clopidogrel (PLAVIX) 75 MG tablet TAKE 1 TABLET BY MOUTH EVERY EVENING  . esomeprazole (NEXIUM) 20 MG capsule Take 1 capsule (20 mg total) by mouth daily with breakfast.  . fluticasone (FLONASE) 50 MCG/ACT nasal spray Place 1 spray into both nostrils daily.  Marland Kitchen glimepiride (AMARYL) 1 MG tablet take 1 tablet by mouth once daily BEFORE BREAKFAST  . imipramine (TOFRANIL) 50 MG tablet Take 1 tablet (50 mg total) by mouth 2 (two) times daily.  . irbesartan (AVAPRO) 75 MG tablet Take 1 tablet (75 mg total) by mouth daily.  Marland Kitchen levothyroxine (SYNTHROID, LEVOTHROID) 25 MCG tablet Take 1 tablet (25 mcg total) by mouth daily.  . metoprolol tartrate (LOPRESSOR) 25 MG tablet Take 25 mg by mouth 2 (two) times daily.  . Multiple Vitamin (MULTIVITAMIN WITH MINERALS) TABS tablet Take 1 tablet by mouth daily.  . nitroGLYCERIN (NITROSTAT) 0.4 MG SL tablet place 1 tablet under the tongue if needed every 5 minutes for chest pain for  3 doses IF NO RELIEF AFTER FIRST DOSE CALL PRESCRIBER OR 911.  Marland Kitchen ONE TOUCH ULTRA TEST test strip 1 each by Other route 2 (two) times daily.  . polycarbophil (FIBERCON) 625 MG tablet Take 1 tablet (625 mg total) by mouth daily.  . predniSONE (DELTASONE) 5 MG tablet After decreasing to 20mg  daily, you can start going down by 5 mg every two weeks.  . promethazine (PHENERGAN) 12.5 MG tablet take 1 tablet by mouth every 6 hours if needed for nausea and vomiting    . [DISCONTINUED] senna (SENOKOT) 8.6 MG TABS tablet Take 1 tablet (8.6 mg total) by mouth at bedtime as needed for mild constipation.   No facility-administered encounter medications on file as of 05/14/2018.    Allergies  Allergen Reactions  . Aspirin Swelling and Other (See Comments)    Angioedema  . Atarax [Hydroxyzine] Other (See Comments)    psychosis  . Nsaids Other (See Comments)    "interacts with heart medications"  . Codeine Nausea And Vomiting and Other (See Comments)    "makes me deathly sick" - can take with nausea medicine   . Haldol [Haloperidol] Other (See Comments)    EPS - rigidity and dystonic reaction, very sensitive  . Other Other (See Comments)    MSG  . Penicillins Diarrhea    "real bad" Has patient had a PCN reaction causing immediate rash, facial/tongue/throat swelling, SOB or lightheadedness with hypotension: Yes Has patient had a PCN reaction causing severe rash involving mucus membranes or skin necrosis: No Has patient had a PCN reaction that required hospitalization No Has patient had a PCN reaction occurring within the last 10 years: Yes If all of the above answers are "NO", then may proceed with Cephalosporin use.    Patient Active Problem List   Diagnosis Date Noted  . Temporal arteritis ([place]) 04/06/2018  . Chronic cough 10/30/2017  . Right knee pain 08/20/2017  . MDD (major depressive disorder), recurrent, severe, with psychosis ([place]) 11/30/2016  . Protein-calorie malnutrition, severe 11/30/2016  . Elevated alkaline phosphatase level 09/18/2016  . Mixed incontinence urge and stress 05/09/2016  . Hypertensive retinopathy of both eyes, grade 2 04/20/2015  . Lumbar spondylosis 06/16/2014  . Carpal tunnel syndrome 02/18/2014  . Right shoulder pain 02/02/2014  . Osteopenia 01/11/2013  . Routine adult health maintenance 01/11/2013  . Diverticulosis 09/18/2012  . Dyspnea on exertion 03/24/2012  . Fatigue 02/26/2012  . Allergic rhinitis 02/13/2012   . Coronary artery disease 08/29/2011  . HEART MURMUR, SYSTOLIC 18/84/1660  . Enlargement of clavicle 03/30/2008  . Solitary pulmonary nodule 12/15/2006  . Hyperlipidemia 11/28/2006  . Hypothyroidism 09/04/2006  . Type 2 diabetes mellitus, controlled ([place]) 09/04/2006  . Migraine 09/04/2006  . Essential hypertension 09/04/2006  . GERD 09/04/2006   Social History   Socioeconomic History  . Marital status: Widowed    Spouse name: Not on file  . Number of children: Not on file  . Years of education: Not on file  . Highest education level: Not on file  Occupational History  . Not on file  Social Needs  . Financial resource strain: Not on file  . Food insecurity:    Worry: Not on file    Inability: Not on file  . Transportation needs:    Medical: Not on file    Non-medical: Not on file  Tobacco Use  . Smoking status: Never Smoker  . Smokeless tobacco: Never Used  Substance and Sexual Activity  . Alcohol use: No  Alcohol/week: 0.0 oz    Comment: 11/29/2016 "Last drink 1967"  . Drug use: No  . Sexual activity: Not Currently  Lifestyle  . Physical activity:    Days per week: Not on file    Minutes per session: Not on file  . Stress: Not on file  Relationships  . Social connections:    Talks on phone: Not on file    Gets together: Not on file    Attends religious service: Not on file    Active member of club or organization: Not on file    Attends meetings of clubs or organizations: Not on file    Relationship status: Not on file  . Intimate partner violence:    Fear of current or ex partner: Not on file    Emotionally abused: Not on file    Physically abused: Not on file    Forced sexual activity: Not on file  Other Topics Concern  . Not on file  Social History Narrative   Never smoked. Lives in [place] with her dtr.  Care for her daughter who has had a lung transplant. Retired Building surveyor.    Ms. Shimer family history includes COPD in her father; Colon cancer  in her maternal grandmother; Heart attack in her maternal grandmother; Other in her mother; Pulmonary Hypertension in her daughter.      Objective:    Vitals:   05/14/18 1401  BP: (!) 126/54  Pulse: 70    Physical Exam; well-developed elderly African-American female in no acute distress, pleasant hard of hearing, blood pressure 126/54 pulse 70, height 5 foot 4, weight 131, BMI of 22.4.  HEENT;nontraumatic normocephalic EOMI PERRLA sclera anicteric, Oropharynx clear, Cardiovascular regular rate and rhythm with S1-S2, Pulmonary; clear bilaterally, Abdomen; soft, nontender nondistended bowel sounds are active there is no palpable mass or hepatosplenomegaly, Rectal; exam; on visual inspection she appears to have mucosal rectal prolapse with friable mucosa just protruding from the anus, this is reducible but recurs.  Anal sphincter tone is significantly decreased.  No mass palpable.  Extremities ;no clubbing cyanosis or edema skin warm and dry, Neuro psych; alert and oriented, grossly nonfocal mood and affect appropriate       Assessment & Plan:   #31 84 year old African-American female with 58-month history of intermittent bright red blood per rectum, complaints of rectal pressure and intermittent protrusion from the rectum, fecal soilage and looser stools. On exam she appears to have early rectal prolapse with rectal mucosa at the anal verge and significantly decreased anal sphincter tone.  Mucosa is reducible but prolapse recurs  - last colonoscopy was 10 years ago, will need to rule out other anal rectal pathology  #2 chronic antiplatelet therapy-on Plavix #3.  Coronary artery disease-no stents #4 hypertension #5.  History of temporal arteritis on steroids #6.  Hypothyroidism #7.  Adult onset diabetes mellitus #8.  History of depression #9.  Chronic kidney disease stage III   Plan; Discussed findings with patient.  Will schedule for colonoscopy with Dr. Havery Moros.  Procedure discussed  in detail with the patient including indications risks and benefits.  She will need to hold Plavix for 5 days prior to the colonoscopy.  We will communicate with her cardiologist, Dr. Skeet Latch to assure this is reasonable for this patient. Once colon is  Cleared, if no other significant pathology she will need surgical consultation for consideration of repair of rectal prolapse.  Lautaro Koral Genia Harold PA-C 05/14/2018   Cc: [name], MD

## 2018-05-14 NOTE — Telephone Encounter (Signed)
[facility] Pre-operative Risk Assessment     Request for surgical clearance:     Endoscopy Procedure  What type of surgery is being performed?     Colonoscopy  When is this surgery scheduled?     05-27-2018  What type of clearance is required ?   Pharmacy  Are there any medications that need to be held prior to surgery and how long? Plavix  Practice name and name of physician performing surgery?      [facility]  What is your office phone and fax number?      Phone- [phone]  [phone]  Anesthesia type (None, local, MAC, general) ?       MAC

## 2018-05-14 NOTE — Patient Instructions (Addendum)
If you are age 82 or older, your body mass index should be between 23-30. Your Body mass index is 22.49 kg/m. If this is out of the aforementioned range listed, please consider follow up with your Primary Care Provider.  You have been scheduled for a colonoscopy. Please follow written instructions given to you at your visit today.  We have provided you with a colonoscopy prep. If you use inhalers (even only as needed), please bring them with you on the day of your procedure. We will call you with the Plavix directions prior to the procedure.

## 2018-05-15 ENCOUNTER — Telehealth: Payer: Self-pay | Admitting: *Deleted

## 2018-05-15 NOTE — Telephone Encounter (Signed)
   Per Dr. Oval Linsey, okay to hold the Plavix for the procedure.  [facility]   Phone- [phone]  Fax- [phone]  Rosaria Ferries, PA-C 05/15/2018 12:49 PM Beeper [phone]

## 2018-05-15 NOTE — Telephone Encounter (Signed)
Advised the patient that she can hold the Plavix on 7-5 through 7-10.  I told her Dr. Havery Moros will tell her when to resume the Plavix.  More, than likely the next day. The patient verbalized understanding the instructions.

## 2018-05-15 NOTE — Telephone Encounter (Signed)
OK to hold Plavix. 

## 2018-05-22 ENCOUNTER — Telehealth: Payer: Self-pay | Admitting: Internal Medicine

## 2018-05-26 ENCOUNTER — Telehealth: Payer: Self-pay | Admitting: Gastroenterology

## 2018-05-26 NOTE — Telephone Encounter (Signed)
Phoned patient to let her know that we can proceed with procedure tomorrow.  Pt verbalized understanding.

## 2018-05-26 NOTE — Telephone Encounter (Signed)
Pt called to report that she took her Plavix on July 5th.  Her last dose was supposed to have been on July 4th.  Is it still ok to proceed with procedure tomorrow?  Please advise.

## 2018-05-26 NOTE — Telephone Encounter (Signed)
She took the plavix at 5:00 pm on 7/5.

## 2018-05-26 NOTE — Telephone Encounter (Signed)
Christine [name], probably okay to proceed but can you clarify what time she took the plavix on 7/5? If she took it in AM that would be fine.

## 2018-05-26 NOTE — Telephone Encounter (Signed)
Okay. At the time of her procedure on 5/10 it will be out of her system for 4 days and 19 hours or so, I think close enough where it won't make a significant difference. I think okay to proceed. Thanks

## 2018-05-26 NOTE — Telephone Encounter (Signed)
Sorry typo, procedure on 7/10 at 1030

## 2018-05-27 ENCOUNTER — Other Ambulatory Visit: Payer: Self-pay

## 2018-05-27 ENCOUNTER — Encounter: Payer: Self-pay | Admitting: Gastroenterology

## 2018-05-27 ENCOUNTER — Ambulatory Visit: Payer: Self-pay | Admitting: Internal Medicine

## 2018-05-27 ENCOUNTER — Ambulatory Visit (AMBULATORY_SURGERY_CENTER): Payer: Medicare Other | Admitting: Gastroenterology

## 2018-05-27 VITALS — BP 133/66 | HR 80 | Temp 98.0°F | Resp 18 | Ht 64.0 in | Wt 131.0 lb

## 2018-05-27 DIAGNOSIS — K6289 Other specified diseases of anus and rectum: Secondary | ICD-10-CM | POA: Diagnosis not present

## 2018-05-27 DIAGNOSIS — K625 Hemorrhage of anus and rectum: Secondary | ICD-10-CM | POA: Diagnosis not present

## 2018-05-27 DIAGNOSIS — K635 Polyp of colon: Secondary | ICD-10-CM

## 2018-05-27 DIAGNOSIS — D126 Benign neoplasm of colon, unspecified: Secondary | ICD-10-CM

## 2018-05-27 DIAGNOSIS — D122 Benign neoplasm of ascending colon: Secondary | ICD-10-CM

## 2018-05-27 DIAGNOSIS — D120 Benign neoplasm of cecum: Secondary | ICD-10-CM | POA: Diagnosis not present

## 2018-05-27 DIAGNOSIS — R197 Diarrhea, unspecified: Secondary | ICD-10-CM

## 2018-05-27 MED ORDER — SODIUM CHLORIDE 0.9 % IV SOLN: 500.0000 mL | INTRAVENOUS | Status: DC

## 2018-05-27 NOTE — Progress Notes (Signed)
To PACU, VSS. Report to [email or web address] 

## 2018-05-27 NOTE — Patient Instructions (Signed)
Handouts given on Diverticulosis and Polyps   YOU HAD AN ENDOSCOPIC PROCEDURE TODAY: Refer to the procedure report and other information in the discharge instructions given to you for any specific questions about what was found during the examination. If this information does not answer your questions, please call [place] office at [phone] to clarify.   YOU SHOULD EXPECT: Some feelings of bloating in the abdomen. Passage of more gas than usual. Walking can help get rid of the air that was put into your GI tract during the procedure and reduce the bloating. If you had a lower endoscopy (such as a colonoscopy or flexible sigmoidoscopy) you may notice spotting of blood in your stool or on the toilet paper. Some abdominal soreness may be present for a day or two, also.  DIET: Your first meal following the procedure should be a light meal and then it is ok to progress to your normal diet. A half-sandwich or bowl of soup is an example of a good first meal. Heavy or fried foods are harder to digest and may make you feel nauseous or bloated. Drink plenty of fluids but you should avoid alcoholic beverages for 24 hours. If you had a esophageal dilation, please see attached instructions for diet.    ACTIVITY: Your care partner should take you home directly after the procedure. You should plan to take it easy, moving slowly for the rest of the day. You can resume normal activity the day after the procedure however YOU SHOULD NOT DRIVE, use power tools, machinery or perform tasks that involve climbing or major physical exertion for 24 hours (because of the sedation medicines used during the test).   SYMPTOMS TO REPORT IMMEDIATELY: A gastroenterologist can be reached at any hour. Please call [phone]  for any of the following symptoms:  Following lower endoscopy (colonoscopy, flexible sigmoidoscopy) Excessive amounts of blood in the stool  Significant tenderness, worsening of abdominal pains  Swelling of  the abdomen that is new, acute  Fever of 100 or higher    FOLLOW UP:  If any biopsies were taken you will be contacted by phone or by letter within the next 1-3 weeks. Call [phone]  if you have not heard about the biopsies in 3 weeks.  Please also call with any specific questions about appointments or follow up tests.

## 2018-05-27 NOTE — Op Note (Signed)
[name] Procedure Date: 05/27/2018 11:16 AM MRN: [id number] Endoscopist: Remo Lipps P. Havery Moros , MD Age: 84 Referring MD:  Date of Birth: 01/24/1934 Gender: Female Account #: [id number] Procedure:                Colonoscopy Indications:              Hematochezia, Fecal incontinence, loose stools,                            reported rectal prolapse Medicines:                Monitored Anesthesia Care Procedure:                Pre-Anesthesia Assessment:                           - Prior to the procedure, a History and Physical                            was performed, and patient medications and                            allergies were reviewed. The patient's tolerance of                            previous anesthesia was also reviewed. The risks                            and benefits of the procedure and the sedation                            options and risks were discussed with the patient.                            All questions were answered, and informed consent                            was obtained. Prior Anticoagulants: The patient has                            taken Plavix (clopidogrel), last dose was 5 days                            prior to procedure. ASA Grade Assessment: III - A                            patient with severe systemic disease. After                            reviewing the risks and benefits, the patient was                            deemed in satisfactory condition to undergo the  procedure.                           After obtaining informed consent, the colonoscope                            was passed under direct vision. Throughout the                            procedure, the patient's blood pressure, pulse, and                            oxygen saturations were monitored continuously. The                            Colonoscope was introduced through the anus and            advanced to the the terminal ileum, with                            identification of the appendiceal orifice and IC                            valve. The colonoscopy was technically difficult                            and complex due to a tortuous colon. The patient                            tolerated the procedure well. The quality of the                            bowel preparation was fair. The terminal ileum,                            ileocecal valve, appendiceal orifice, and rectum                            were photographed. Scope In: 11:27:13 AM Scope Out: 11:55:57 AM Scope Withdrawal Time: 0 hours 20 minutes 51 seconds  Total Procedure Duration: 0 hours 28 minutes 44 seconds  Findings:                 The digital rectal exam findings include decreased                            sphincter tone.                           The terminal ileum appeared normal.                           A 3 mm polyp was found in the cecum. The polyp was                            sessile. The polyp  was removed with a cold biopsy                            forceps. Resection and retrieval were complete.                           A 8 mm polyp was found in the ascending colon. The                            polyp was sessile. The polyp was removed with a                            cold snare. Resection and retrieval were complete.                           Multiple medium-mouthed diverticula were found in                            the sigmoid colon.                           The colon was tortuous.                           An area of erythematous mucosa was found in the                            distal rectum consistent with prolapse changes.                            Biopsies were taken with a cold forceps for                            histology to ensure no adenomatous change.                           There was signfiicant residual stool in the colon.                             Several minutes spent lavaging the colon, the                            endoscope clogged with repeated attempts at                            suctioning. The cecum was difficult to clear                            entirely and dependant portions of the colon and                            right colon, some areas flat or small lesions may  not have been appreciated. No mass lesions. The                            exam was otherwise without abnormality.                           Biopsies for histology were taken with a cold                            forceps from the right colon, left colon and                            transverse colon for evaluation of microscopic                            colitis. Complications:            No immediate complications. Estimated blood loss:                            Minimal. Estimated Blood Loss:     Estimated blood loss was minimal. Impression:               , signfiicant residual stool, time spent lavaging                            the colon as above - some views were limited due to                            this issue but no evidence of malignancy                           - Preparation of the colon was fair.                           - Decreased sphincter tone found on digital rectal                            exam.                           - The examined portion of the ileum was normal.                           - One 3 mm polyp in the cecum, removed with a cold                            biopsy forceps. Resected and retrieved.                           - One 7 mm polyp in the ascending colon, removed                            with a cold snare. Resected and retrieved.                           -  Diverticulosis in the sigmoid colon.                           - Tortuous colon.                           - Erythematous mucosa in the rectum. Biopsied.                           - The examination was otherwise normal.                            - Biopsies were taken with a cold forceps from the                            right colon, left colon and transverse colon for                            evaluation of microscopic colitis. Recommendation:           - Patient has a contact number available for                            emergencies. The signs and symptoms of potential                            delayed complications were discussed with the                            patient. Return to normal activities tomorrow.                            Written discharge instructions were provided to the                            patient.                           - Resume previous diet.                           - Continue present medications.                           - Resume Plavix tomorrow                           - Await pathology results.                           - Will discuss referral to pelvic floor PT to treat                            prolapse, low sphincter tone Christine Meda P. Honesti Seaberg, MD 05/27/2018 12:05:01 PM This report has been signed electronically.

## 2018-05-27 NOTE — Progress Notes (Signed)
Called to room to assist during endoscopic procedure.  Patient ID and intended procedure confirmed with present staff. Received instructions for my participation in the procedure from the performing physician.  

## 2018-05-28 ENCOUNTER — Telehealth: Payer: Self-pay

## 2018-05-28 NOTE — Telephone Encounter (Signed)
Christine Denmark, MD  Debbora Lacrosse, RN        Pt Christine [name] (dob [birth year])  Had colonoscopy by Dr. Nicki Guadalajara yesterday.  Daughter called due to fever of 102  No other symptoms-no abdominal pain, burning urination, cough or any shortness of breath  I gave her 2 Tylenols-patient felt better.  Could you please ask Dr. Doyne Keel nurse to give patient's daughter a follow-up call to make sure that patient is doing okay.  Daughter's phone no is [phone]  Neldon Labella     Dr Havery Moros, I spoke with her daughter Annamary Carolin this morning.  [name]'s temp this morning was 98.9, she is eating and drinking, no complaints of any pain, + flatus.  Daughter will contact us with any changes in her recovery.

## 2018-05-28 NOTE — Telephone Encounter (Signed)
Thanks for letting me know, glad she is feeling better.

## 2018-05-28 NOTE — Telephone Encounter (Signed)
  Follow up Call-  Call back number 05/27/2018  Post procedure Call Back phone  # [phone]  Permission to leave phone message No  Some recent data might be hidden     Patient questions:  Do you have a fever, pain , or abdominal swelling? No. Pain Score  0 *  Have you tolerated food without any problems? Yes.    Have you been able to return to your normal activities? Yes.    Do you have any questions about your discharge instructions: Diet   No. Medications  No. Follow up visit  No.  Do you have questions or concerns about your Care? No.  Actions: * If pain score is 4 or above: No action needed, pain <4.

## 2018-05-28 NOTE — Telephone Encounter (Signed)
Patient had colonoscopy yesterday due to rectal bleeding Had poor preparation and prolonged procedure Daughter called last night at 8 PM-patient had fever of 102F No symptoms-no abdominal pain, shortness of breath, cough or burning urination.  She tolerated p.o. well after colonoscopy. I told her to give her Tylenol.  I called back after 3 hours and talk to the daughter again.  Patient was feeling better. I have sent a message for my nurse to call patient's daughter back today to make sure the patient is doing well.

## 2018-06-01 ENCOUNTER — Telehealth: Payer: Self-pay | Admitting: Gastroenterology

## 2018-06-01 NOTE — Telephone Encounter (Signed)
Patient is asking if she can take Benefiber instead of Metamucil due to gas.  She is advised ok to switch. She will call back for any additional questions or concerns.

## 2018-06-01 NOTE — Telephone Encounter (Signed)
Pt would like to speak with you regarding a fiber medication that she is taking.

## 2018-06-03 ENCOUNTER — Other Ambulatory Visit: Payer: Self-pay | Admitting: Gastroenterology

## 2018-06-04 ENCOUNTER — Telehealth: Payer: Self-pay | Admitting: Gastroenterology

## 2018-06-04 DIAGNOSIS — K622 Anal prolapse: Secondary | ICD-10-CM

## 2018-06-04 NOTE — Telephone Encounter (Signed)
Pt calling about referral to PT for pelvic floor exercises. Procedure report states will discuss PT. Please advise.

## 2018-06-04 NOTE — Telephone Encounter (Signed)
Patient had colonoscopy done for loose stools, rectal prolapse / bleeding She had 2 polyps removed, one was an adenoma Bowel prep was fair but no mass lesions - small or flat polyps may not have been appreciated No evidence of microscopic colitis on random biopsies Rectal biopsies of erythema noted are c/w prolapse changes.  Recommend she try immodium once daily and titrate as needed for loose stools Recommend referral to pelvic floor PT for treatment of prolapse and low anal tone, hopefully that will help prolapse. If symptoms persist she should contact us for further recommendations. Can you please help coordinate this.  I don't feel strongly she should have a surveillance colonoscopy given the results of this exam, no high risk lesions, and her age / comorbidities.

## 2018-06-05 NOTE — Telephone Encounter (Signed)
Referral placed for pelvic floor PT. Pt should be contacted with an appt.

## 2018-06-05 NOTE — Telephone Encounter (Signed)
Spoke with pt and she is aware.

## 2018-06-11 ENCOUNTER — Ambulatory Visit: Payer: Medicare Other | Attending: Gastroenterology | Admitting: Physical Therapy

## 2018-06-11 ENCOUNTER — Encounter: Payer: Self-pay | Admitting: Physical Therapy

## 2018-06-11 ENCOUNTER — Other Ambulatory Visit: Payer: Self-pay

## 2018-06-11 DIAGNOSIS — M6281 Muscle weakness (generalized): Secondary | ICD-10-CM

## 2018-06-11 DIAGNOSIS — R279 Unspecified lack of coordination: Secondary | ICD-10-CM | POA: Diagnosis not present

## 2018-06-11 DIAGNOSIS — K623 Rectal prolapse: Secondary | ICD-10-CM | POA: Diagnosis not present

## 2018-06-11 NOTE — Patient Instructions (Addendum)
Quick Contraction: Gravity Eliminated (Side-Lying)    Lie on left side, hips and knees slightly bent. Quickly squeeze then fully relax pelvic floor. Perform __1_ sets of _5__. Rest for _1__ seconds between sets. Do _4__ times a day.   Copyright  VHI. All rights reserved.  Slow Contraction: Gravity Eliminated (Side-Lying)    Lie on left side, hips and knees slightly bent. Slowly squeeze pelvic floor for _5__ seconds. Rest for _5__ seconds. Repeat _5__ times. Do _4__ times a day.   Copyright  VHI. All rights reserved.  About Fecal Incontinence Fecal incontinence is loss of control over bowel movements.  Diarrhea is short-term loss of bowel control and can happen to anyone as an isolated event. Some people experience constant loss of gas (flatus) without awareness, which is called anal (gas) incontinence. Loss of bowel control can also occur as a result of:  . childbirth or traumatic injury to the rectal area  . irritation or infection of the rectum, anus, or the surrounding area  . spinal cord injury  . brain condition such as head injury, stroke, or coma  . chronic constipation (can cause the muscles of the rectum and intestines to stretch and weaken)  . Alzheimer's disease or other dementias Treatment Options depend on the cause Many people benefit from behavioral techniques and/or exercise. These include: . exercises for the sphincters and pelvic floor muscles . learning proper bowel health maintenance . proper toileting positioning . dietary factor management . bowel retraining / learning control techniques Surgery Surgery may be needed to repair the muscle at the opening of the rectum.  Another type of surgery is a colostomy. A colostomy attaches part of the colon to an opening in the wall of the abdomen. Bowel movements then pass through this opening instead of the rectum. They are collected in a bag outside the body.  Medication A person can usually control stool better when  it is firm rather than loose or liquid. Sometimes taking medications to change the consistency of the stool can provide relief. Over-the-counter anti-diarrhea medications may include Imodium, and prescription medications may include Lomotil.  These medications should be discussed with your physician prior to use. Christine [name], PT  [facility] [address] [place], [place] [id number] [phone]

## 2018-06-11 NOTE — Therapy (Signed)
[facility] [address], [place], [place], [address] Phone: [phone]   Fax:  [phone]  Physical Therapy Evaluation  Patient Details  Name: Christine [name] MRN: [id number] Date of Birth: 01/24/1934 Referring Provider: Dr. [place] Cellar   Encounter Date: 06/11/2018  PT End of Session - 06/11/18 1128    Visit Number  1    Date for PT Re-Evaluation  08/06/18    Authorization Type  Medicare/Medicaid    PT Start Time  0930    PT Stop Time  1010    PT Time Calculation (min)  40 min    Activity Tolerance  Patient tolerated treatment well    Behavior During Therapy  [facility] for tasks assessed/performed       Past Medical History:  Diagnosis Date  . Anemia   . Anginal pain ([place])   . Arthritis    "back, arms, hips; hands" (11/30/2015)  . Benign hypertensive heart and kidney disease with diastolic CHF, NYHA class II and CKD stage III ([place])   . Blood transfusion 1972   "after daughter born, attempted to give me blood; couldn't give it cause my blood was cold" (09/23/2013)  . CAD (coronary artery disease)    a. LHC 08/23/11: dLM 40-50%, oRI 40%, oCFX 40%, oD1 70% (small and not amenable to PCI).  LM lesion did not appear to be flow limiting.  Medical rx was recommended;  b. Echo 08/23/11: mild LVH, EF 60-65%, grade 1 diast dysfxn, mild BAE, PASP 24;  c. 02/2012  Cath: LM 50d, LAD 50p, D1 70ost, RI 70p, RCA ok->Med Rx;  d. 01/2013 Cardiolite: EF 88, no ischemia/infarct.  . Carotid stenosis    a. dopplers 10/12:  0-39% bilat ICA;  b. 10/2012 U/S: 0-39% bilat, f/u 1 yr (10/2013).  . Chronic bronchitis   . Depression   . Diverticulosis of colon (without mention of hemorrhage)   . GERD (gastroesophageal reflux disease)   . Heart murmur, systolic    2-D echo in December 2011 showed a normal EF with grade 1 diastolic dysfunction, trivial pulmonary regurgitation and mildly elevated PA pressure at 37 mmHg probably secondary to her  COPD.dynamic obstruction-mid cavity obliteration;  b. 02/2012 Echo: EF 60-65%, mild LVH, PASP 62mmHg.  Marland Kitchen History of stomach ulcers 1970's  . Hyperlipidemia   . Hypertension   . Hypothyroidism   . Migraines    a. Next atypical symptoms in the past. Patient was started on Neurontin for possible neuropathic origin of her pain.  . Polymyalgia rheumatica ([place])   . Shortness of breath on exertion    "just related to angina >1 yr ago" (09/23/2013)  . Sinus arrhythmia   . Type II diabetes mellitus ([place])    a. On oral hypoglycemic agents.    Past Surgical History:  Procedure Laterality Date  . CARDIAC CATHETERIZATION N/A 09/26/2015   Procedure: Right/Left Heart Cath and Coronary Angiography;  Surgeon: Larey Dresser, MD;  Location: [place] [facility];  Service: Cardiovascular;  Laterality: N/A;  . CATARACT EXTRACTION W/ INTRAOCULAR LENS  IMPLANT, BILATERAL Bilateral 1990's  . LEFT HEART CATHETERIZATION WITH CORONARY ANGIOGRAM N/A 03/16/2012   Procedure: LEFT HEART CATHETERIZATION WITH CORONARY ANGIOGRAM;  Surgeon: Hillary Bow, MD;  Location: [facility] CATH LAB;  Service: Cardiovascular;  Laterality: N/A;  . RIGHT/LEFT HEART CATH AND CORONARY ANGIOGRAPHY N/A 08/12/2017   Procedure: RIGHT/LEFT HEART CATH AND CORONARY ANGIOGRAPHY;  Surgeon: Martinique, Peter M, MD;  Location: [place] [facility];  Service:  Cardiovascular;  Laterality: N/A;  . VAGINAL HYSTERECTOMY  1976    There were no vitals filed for this visit.   Subjective Assessment - 06/11/18 0939    Subjective  Patient reports her rectal prolapse started several months ago, 03/2018. Patient has to push the prolapse back in. I have pressure in the rectum. Feeling of having to go to the bathroom but does not. In the past had to strain to have a bowel movement.     Patient Stated Goals  reduce pain , help with prolapse    Currently in Pain?  Yes    Pain Score  10-Worst pain ever low pain is 4/10    Pain Location  Rectum inner thigh    Pain  Orientation  Mid    Pain Descriptors / Indicators  Pressure    Pain Type  Acute pain    Pain Onset  More than a month ago    Pain Frequency  Intermittent    Aggravating Factors   walking, standing, after toileting    Pain Relieving Factors  medication, lay down    Multiple Pain Sites  No         [facility] PT Assessment - 06/11/18 0001      Assessment   Medical Diagnosis  K62.2 Anal prolapse    Referring Provider  Dr. [place] Cellar    Onset Date/Surgical Date  03/18/18    Prior Therapy  none      Precautions   Precautions  None      Restrictions   Weight Bearing Restrictions  No      Balance Screen   Has the patient fallen in the past 6 months  No    Has the patient had a decrease in activity level because of a fear of falling?   No    Is the patient reluctant to leave their home because of a fear of falling?   No      Home Film/video editor residence    Living Arrangements  Children      Prior Function   Level of [place]  Retired      Associate Professor   Overall Cognitive Status  Within Functional Limits for tasks assessed      Posture/Postural Control   Posture/Postural Control  No significant limitations      ROM / Strength   AROM / PROM / Strength  AROM;PROM;Strength      AROM   Overall AROM   Within functional limits for tasks performed      PROM   Right Hip External Rotation   40    Left Hip Extension  --    Left Hip External Rotation   40      Strength   Right Hip Extension  3/5    Right Hip External Rotation   4-/5    Right Hip ABduction  3+/5    Left Hip Extension  3/5    Left Hip External Rotation  4-/5    Left Hip ABduction  3+/5      Right Hip   Right Hip Extension  --      Flexibility   Hamstrings  decreased length bilaterally      Transfers   Transfers  Not assessed      Standardized Balance Assessment   Standardized Balance Assessment  Five Times Sit to Stand    Five times sit to  stand comments  25 secno arms                Objective measurements completed on examination: See above findings.    Pelvic Floor Special Questions - 06/11/18 0001    Urinary Leakage  No    Fecal incontinence  Yes    Falling out feeling (prolapse)  Yes    Activities that cause feeling of prolapse  sit to stand    External Perineal Exam  stool leakage, pieces of tissue on the rectal area    Skin Integrity  Intact    Prolapse  Other rectal prolapse    Pelvic Floor Internal Exam  Patient confirms identification and approves PT to assess the pelvic floor and treat    Exam Type  Rectal    Strength  weak squeeze, no lift    Strength # of reps  3    Strength # of seconds  4    Tone  low               PT Education - 06/11/18 1128    Education Details  information on fecal leakage; pelvic floor exercise    Person(s) Educated  Patient    Methods  Explanation;Demonstration;Verbal cues;Handout    Comprehension  Returned demonstration;Verbalized understanding       PT Short Term Goals - 06/11/18 1136      PT SHORT TERM GOAL #1   Title  independent with initial HEP with pelvic floor contraction    Time  4    Period  Weeks    Status  New    Target Date  07/09/18      PT SHORT TERM GOAL #2   Title  abitliy to contract the pelvic floor and lower abdominal to improve continence    Time  4    Period  Weeks    Status  New    Target Date  07/09/18      PT SHORT TERM GOAL #3   Title  understand correct toilet technique to fully evacuate his bowels    Time  4    Period  Weeks    Status  New    Target Date  07/09/18      PT SHORT TERM GOAL #4   Title  understand bowel health    Time  4    Period  Weeks    Status  New    Target Date  07/09/18        PT Long Term Goals - 06/11/18 1138      PT LONG TERM GOAL #1   Title  independent with HEP and understand how to progress herself    Time  8    Period  Weeks    Status  New    Target Date  08/06/18       PT LONG TERM GOAL #2   Title  improved fecal leakage >/= 50% due to improved strength    Time  8    Period  Weeks    Status  New    Target Date  08/06/18      PT LONG TERM GOAL #3   Title  rectal prolpase reduce 50% of the time due to imrpoved pelvic floor strength >/= 3/5 holding or 10 seconds    Time  8    Period  Weeks    Status  New    Target Date  08/06/18      PT LONG TERM GOAL #4  Title  understand ways to manage rectal prolpase     Time  8    Period  Weeks    Status  New    Target Date  08/06/18             Plan - 06/11/18 1129    Clinical Impression Statement  Patient is a 84 year old female with fecal leakage and rectal prolapse for several months.  Patient wears pads during the day.  Patient reports her rectal and thigh pain ranges from 4-10. Patient has increased pain with sitting, standing, and walking. Pateint is careful with sit to stand and back due to fecal leakage and pain.  Sit to stand test was 27 seconds indicating risk of falls and norm should be 14.8 seconds.  Pelvic floor strength is 2/5 with ability to contract 3 times for 4 seconds.  The rectal area had fecal leakage and mucous leakage with tissue around the area.  Rectal prolpase around the anal entrance.  Bilateral hip strength averages 3-4/5 strength.  Bilateral hip external rotation is 40 degrees for P/ROM.  Patient will benefit from skilled therapy to improve pelvic floor strength and coordination to reduce fecal leakage.     History and Personal Factors relevant to plan of care:  Plymyalgia rheumatica; osteopenia; diverticulosis, hypothyroidsm, diabetes; vaginal hysterctomy; cardiac catherization    Clinical Presentation  Evolving    Clinical Presentation due to:  soil her underwear throughout the day     Clinical Decision Making  Moderate    Rehab Potential  Excellent    Clinical Impairments Affecting Rehab Potential  Plymyalgia rheumatica; osteopenia; diverticulosis, hypothyroidsm, diabetes;  vaginal hysterctomy; cardiac catherization    PT Frequency  2x / week    PT Duration  8 weeks    PT Treatment/Interventions  Biofeedback;Cryotherapy;Electrical Stimulation;Moist Heat;Therapeutic exercise;Therapeutic activities;Functional mobility training;Neuromuscular re-education;Patient/family education;Manual techniques;Passive range of motion    PT Next Visit Plan  hip stretches; pelvic floor exercise; abdominal bracing; toileting technique;     Consulted and Agree with Plan of Care  Patient       Patient will benefit from skilled therapeutic intervention in order to improve the following deficits and impairments:  Pain, Increased fascial restricitons, Decreased mobility, Decreased coordination, Increased muscle spasms, Decreased activity tolerance, Decreased endurance, Decreased range of motion, Decreased strength  Visit Diagnosis: Muscle weakness (generalized) - Plan: PT plan of care cert/re-cert  Unspecified lack of coordination - Plan: PT plan of care cert/re-cert  Rectal prolapse - Plan: PT plan of care cert/re-cert     Problem List Patient Active Problem List   Diagnosis Date Noted  . Temporal arteritis ([place]) 04/06/2018  . Chronic cough 10/30/2017  . Right knee pain 08/20/2017  . MDD (major depressive disorder), recurrent, severe, with psychosis ([place]) 11/30/2016  . Protein-calorie malnutrition, severe 11/30/2016  . Elevated alkaline phosphatase level 09/18/2016  . Mixed incontinence urge and stress 05/09/2016  . Hypertensive retinopathy of both eyes, grade 2 04/20/2015  . Lumbar spondylosis 06/16/2014  . Carpal tunnel syndrome 02/18/2014  . Right shoulder pain 02/02/2014  . Osteopenia 01/11/2013  . Routine adult health maintenance 01/11/2013  . Diverticulosis 09/18/2012  . Dyspnea on exertion 03/24/2012  . Fatigue 02/26/2012  . Allergic rhinitis 02/13/2012  . Coronary artery disease 08/29/2011  . HEART MURMUR, SYSTOLIC 93/26/7124  . Enlargement of clavicle  03/30/2008  . Solitary pulmonary nodule 12/15/2006  . Hyperlipidemia 11/28/2006  . Hypothyroidism 09/04/2006  . Type 2 diabetes mellitus, controlled ([place]) 09/04/2006  . Migraine 09/04/2006  .  Essential hypertension 09/04/2006  . GERD 09/04/2006    Earlie Counts, PT 06/11/18 11:42 AM   [facility] [address] [place], [place], [address] Phone: [phone]   Fax:  [phone]  Name: Christine [name] MRN: [id number] Date of Birth: 01/24/1934

## 2018-06-15 ENCOUNTER — Ambulatory Visit: Payer: Medicare Other | Admitting: Physical Therapy

## 2018-06-15 ENCOUNTER — Encounter: Payer: Self-pay | Admitting: Physical Therapy

## 2018-06-15 DIAGNOSIS — K623 Rectal prolapse: Secondary | ICD-10-CM | POA: Diagnosis not present

## 2018-06-15 DIAGNOSIS — M6281 Muscle weakness (generalized): Secondary | ICD-10-CM | POA: Diagnosis not present

## 2018-06-15 DIAGNOSIS — R279 Unspecified lack of coordination: Secondary | ICD-10-CM | POA: Diagnosis not present

## 2018-06-15 NOTE — Patient Instructions (Addendum)
About Abdominal Massage  Abdominal massage, also called external colon massage, is a self-treatment circular massage technique that can reduce and eliminate gas and ease constipation. The colon naturally contracts in waves in a clockwise direction starting from inside the right hip, moving up toward the ribs, across the belly, and down inside the left hip.  When you perform circular abdominal massage, you help stimulate your colon's normal wave pattern of movement called peristalsis.  It is most beneficial when done after eating.  Positioning You can practice abdominal massage with oil while lying down, or in the shower with soap.  Some people find that it is just as effective to do the massage through clothing while sitting or standing.  How to Massage Start by placing your finger tips or knuckles on your right side, just inside your hip bone.  . Make small circular movements while you move upward toward your rib cage.   . Once you reach the bottom right side of your rib cage, take your circular movements across to the left side of the bottom of your rib cage.  . Next, move downward until you reach the inside of your left hip bone.  This is the path your feces travel in your colon. . Continue to perform your abdominal massage in this pattern for 2-3 minutes each day.     You can apply as much pressure as is comfortable in your massage.  Start gently and build pressure as you continue to practice.  Notice any areas of pain as you massage; areas of slight pain may be relieved as you massage, but if you have areas of significant or intense pain, consult with your healthcare provider.  Other Considerations . General physical activity including bending and stretching can have a beneficial massage-like effect on the colon.  Deep breathing can also stimulate the colon because breathing deeply activates the same nervous system that supplies the colon.   . Abdominal massage should always be used in  combination with a bowel-conscious diet that is high in the proper type of fiber for you, fluids (primarily water), and a regular exercise program.   Adduction: Hip - Knees Together With Pelvic Floor (Hook-Lying)    Lie with hips and knees bent, towel roll between knees. Squeeze pelvic floor while pushing knees together. Hold for _5__ seconds. Rest for _5__ seconds. Repeat _10__ times. Do _2__ times a day.   Copyright  VHI. All rights reserved.  External Rotation: Hip - Knees Apart With Pelvic Floor (Hook-Lying)    Lie with hips and knees bent, band tied just above knees. Squeeze pelvic floor while pulling knees apart. Hold for __5_ seconds. Rest for _5__ seconds. Repeat _10__ times. Do _2__ times a day.   Copyright  VHI. All rights reserved.  Gluteal Squeeze    Squeeze buttocks muscles as tightly as possible while counting out loud to _5___. Repeat __10__ times. Do __1__ sessions per day.  http://[email or web address].exer.us/364   Copyright  VHI. All rights reserved.  Hip Adductor: Knee Fall Out (Supine)    Lie on back, knees bent. Place hands on inner thighs. Pull legs apart until stretch is felt in inner thigh. Hold _30__ seconds. Relax. Repeat __1_ times. Do __1_ times a day.   Copyright  VHI. All rights reserved.  Posterior Hip (Supine)    Lie on back. Cross right ankle on other thigh. Slide other foot on surface toward buttocks until stretch is felt in back of hip. Hold _30__ seconds. Relax. Repeat _1_ times.  Do _1__ times a day. Repeat with legs switched.    Copyright  VHI. All rights reserved.  Quads / HF, Supine    Lie near edge of bed, one leg bent, foot flat on bed. Other leg hanging over edge, relaxed, thigh resting entirely on bed. Bend hanging knee backward keeping thigh in contact with bed. Hold _30__ seconds.  Repeat _1__ times per session. Do _1__ sessions per day.  Copyright  VHI. All rights reserved.  Chair Sitting    Sit at edge of seat, spine  straight, one leg extended. Put a hand on each thigh and bend forward from the hip, keeping spine straight. Allow hand on extended leg to reach toward toes. Support upper body with other arm. Hold _30__ seconds. Repeat _1__ times per session. Do _1__ sessions per day.  Copyright  VHI. All rights reserved.  [facility] [address] [place] [employer], [place] [address] Phone # [phone] Fax [phone]

## 2018-06-15 NOTE — Therapy (Addendum)
[facility] [address], [place], [place], [address] Phone: [phone]   Fax:  [phone]  Physical Therapy Treatment  Patient Details  Name: Christine [name] MRN: [id number] Date of Birth: 01/24/1934 Referring Provider: Dr. [place] Cellar   Encounter Date: 06/15/2018  PT End of Session - 06/15/18 1410    Visit Number  2    Date for PT Re-Evaluation  08/06/18    Authorization Type  Medicare/Medicaid    PT Start Time  1410 came late and had to go to the bathroom    PT Stop Time  1445    PT Time Calculation (min)  35 min    Activity Tolerance  Patient tolerated treatment well    Behavior During Therapy  [facility] for tasks assessed/performed       Past Medical History:  Diagnosis Date  . Anemia   . Anginal pain ([place])   . Arthritis    "back, arms, hips; hands" (11/30/2015)  . Benign hypertensive heart and kidney disease with diastolic CHF, NYHA class II and CKD stage III ([place])   . Blood transfusion 1972   "after daughter born, attempted to give me blood; couldn't give it cause my blood was cold" (09/23/2013)  . CAD (coronary artery disease)    a. LHC 08/23/11: dLM 40-50%, oRI 40%, oCFX 40%, oD1 70% (small and not amenable to PCI).  LM lesion did not appear to be flow limiting.  Medical rx was recommended;  b. Echo 08/23/11: mild LVH, EF 60-65%, grade 1 diast dysfxn, mild BAE, PASP 24;  c. 02/2012  Cath: LM 50d, LAD 50p, D1 70ost, RI 70p, RCA ok->Med Rx;  d. 01/2013 Cardiolite: EF 88, no ischemia/infarct.  . Carotid stenosis    a. dopplers 10/12:  0-39% bilat ICA;  b. 10/2012 U/S: 0-39% bilat, f/u 1 yr (10/2013).  . Chronic bronchitis   . Depression   . Diverticulosis of colon (without mention of hemorrhage)   . GERD (gastroesophageal reflux disease)   . Heart murmur, systolic    2-D echo in December 2011 showed a normal EF with grade 1 diastolic dysfunction, trivial pulmonary regurgitation and mildly elevated PA pressure at  37 mmHg probably secondary to her COPD.dynamic obstruction-mid cavity obliteration;  b. 02/2012 Echo: EF 60-65%, mild LVH, PASP 46mHg.  .Marland KitchenHistory of stomach ulcers 1970's  . Hyperlipidemia   . Hypertension   . Hypothyroidism   . Migraines    a. Next atypical symptoms in the past. Patient was started on Neurontin for possible neuropathic origin of her pain.  . Pol[place]yalgia rheumatica ([place]   . Shortness of breath on exertion    "just related to angina >1 yr ago" (09/23/2013)  . Sinus arrhythmia   . Type II diabetes mellitus ([place]    a. On oral hypoglycemic agents.    Past Surgical History:  Procedure Laterality Date  . CARDIAC CATHETERIZATION N/A 09/26/2015   Procedure: Right/Left Heart Cath and Coronary Angiography;  Surgeon: DLarey Dresser MD;  [facility];  Service: Cardiovascular;  Laterality: N/A;  . CATARACT EXTRACTION W/ INTRAOCULAR LENS  IMPLANT, BILATERAL Bilateral 1990's  . LEFT HEART CATHETERIZATION WITH CORONARY ANGIOGRAM N/A 03/16/2012   Procedure: LEFT HEART CATHETERIZATION WITH CORONARY ANGIOGRAM;  Surgeon: THillary Bow MD;  [facility];  Service: Cardiovascular;  Laterality: N/A;  . RIGHT/LEFT HEART CATH AND CORONARY ANGIOGRAPHY N/A 08/12/2017   Procedure: RIGHT/LEFT HEART CATH AND CORONARY ANGIOGRAPHY;  Surgeon: JMartinique Peter M,  MD;  Location: [facility];  Service: Cardiovascular;  Laterality: N/A;  . VAGINAL HYSTERECTOMY  1976    There were no vitals filed for this visit.  Subjective Assessment - 06/15/18 1411    Subjective  I am hurting today and I have been out more than usual. I had to push the prolapse in earlier today.     Patient Stated Goals  reduce pain , help with prolapse    Currently in Pain?  Yes    Pain Score  5     Pain Location  Rectum bi. thighs    Pain Orientation  Mid    Pain Descriptors / Indicators  Tender    Pain Type  Acute pain    Pain Onset  More than a month ago    Pain Frequency  Intermittent     Aggravating Factors   walking, standing, after toilet    Pain Relieving Factors  medication, lay down    Effect of Pain on Daily Activities  bowel movements       Manual  Physical therapist performed abdominal massage to patient to improve peristalic movement of the large intestines to reduce chance of constipation to reduce her rectal prolapse.  Therapist instructed patient on how to perform herself for HEP and to perform 1 time daily.  Therapeutic exercise  Pelvic floor contraction supine with hip adduction hold 5 seconds and 10 times  Pelvic floor contraction supine with hip ER with theraband hold 5 seconds 10 times  Gluteal squeeze hold 5 sec 10 times  Butterfly stretch hold 1 min  Supine piriformis stretch hold 30 seconds 2 times each leg  Quad stretch in supine hold 30 seconds 2 times each leg Earlie Counts, PT 07/01/18 1:35 PM                         PT Education - 06/15/18 1437    Education Details  abdominal massage; hip stretches; pelvic floor exercises    Person(s) Educated  Patient    Methods  Explanation;Demonstration;Verbal cues;Handout    Comprehension  Returned demonstration;Verbalized understanding       PT Short Term Goals - 06/11/18 1136      PT SHORT TERM GOAL #1   Title  independent with initial HEP with pelvic floor contraction    Time  4    Period  Weeks    Status  New    Target Date  07/09/18      PT SHORT TERM GOAL #2   Title  abitliy to contract the pelvic floor and lower abdominal to improve continence    Time  4    Period  Weeks    Status  New    Target Date  07/09/18      PT SHORT TERM GOAL #3   Title  understand correct toilet technique to fully evacuate his bowels    Time  4    Period  Weeks    Status  New    Target Date  07/09/18      PT SHORT TERM GOAL #4   Title  understand bowel health    Time  4    Period  Weeks    Status  New    Target Date  07/09/18        PT Long Term Goals - 06/11/18 1138      PT  LONG TERM GOAL #1   Title  independent with HEP and  understand how to progress herself    Time  8    Period  Weeks    Status  New    Target Date  08/06/18      PT LONG TERM GOAL #2   Title  improved fecal leakage >/= 50% due to improved strength    Time  8    Period  Weeks    Status  New    Target Date  08/06/18      PT LONG TERM GOAL #3   Title  rectal prolpase reduce 50% of the time due to imrpoved pelvic floor strength >/= 3/5 holding or 10 seconds    Time  8    Period  Weeks    Status  New    Target Date  08/06/18      PT LONG TERM GOAL #4   Title  understand ways to manage rectal prolpase     Time  8    Period  Weeks    Status  New    Target Date  08/06/18            Plan - 06/15/18 1444    Clinical Impression Statement  Patient has just started so she has not met goals.  Patient has learned pelvic floor exercises and hip stretches for HEP.  Patient knows how to perform abdominal massage to improve bowel movements.  Patient will benefit from skilled therapy to improve pelvic floor strength and coordination to reduce fecal leakage.     Rehab Potential  Excellent    Clinical Impairments Affecting Rehab Potential  Plymyalgia rheumatica; osteopenia; diverticulosis, hypothyroidsm, diabetes; vaginal hysterctomy; cardiac catherization    PT Frequency  2x / week    PT Duration  8 weeks    PT Treatment/Interventions  Biofeedback;Cryotherapy;Electrical Stimulation;Moist Heat;Therapeutic exercise;Therapeutic activities;Functional mobility training;Neuromuscular re-education;Patient/family education;Manual techniques;Passive range of motion    PT Next Visit Plan   pelvic floor exercise; abdominal bracing; toileting technique; bowel health    Recommended Other Services  MD signed initial note    Consulted and Agree with Plan of Care  Patient       Patient will benefit from skilled therapeutic intervention in order to improve the following deficits and impairments:  Pain,  Increased fascial restricitons, Decreased mobility, Decreased coordination, Increased muscle spasms, Decreased activity tolerance, Decreased endurance, Decreased range of motion, Decreased strength  Visit Diagnosis: Muscle weakness (generalized)  Unspecified lack of coordination  Rectal prolapse     Problem List Patient Active Problem List   Diagnosis Date Noted  . Temporal arteritis ([place]) 04/06/2018  . Chronic cough 10/30/2017  . Right knee pain 08/20/2017  . MDD (major depressive disorder), recurrent, severe, with psychosis ([place]) 11/30/2016  . Protein-calorie malnutrition, severe 11/30/2016  . Elevated alkaline phosphatase level 09/18/2016  . Mixed incontinence urge and stress 05/09/2016  . Hypertensive retinopathy of both eyes, grade 2 04/20/2015  . Lumbar spondylosis 06/16/2014  . Carpal tunnel syndrome 02/18/2014  . Right shoulder pain 02/02/2014  . Osteopenia 01/11/2013  . Routine adult health maintenance 01/11/2013  . Diverticulosis 09/18/2012  . Dyspnea on exertion 03/24/2012  . Fatigue 02/26/2012  . Allergic rhinitis 02/13/2012  . Coronary artery disease 08/29/2011  . HEART MURMUR, SYSTOLIC 13/06/6577  . Enlargement of clavicle 03/30/2008  . Solitary pulmonary nodule 12/15/2006  . Hyperlipidemia 11/28/2006  . Hypothyroidism 09/04/2006  . Type 2 diabetes mellitus, controlled ([place]) 09/04/2006  . Migraine 09/04/2006  . Essential hypertension 09/04/2006  . GERD 09/04/2006    Malachy Mood  Pearline Cables, PT 06/15/18 2:47 PM   [facility] [address] [place], [place], [address] Phone: [phone]   Fax:  [phone]  Name: Christine [name] MRN: [id number] Date of Birth: 01/24/1934  PHYSICAL THERAPY DISCHARGE SUMMARY  Visits from Start of Care: 1  Current functional level related to goals / functional outcomes: See above.    Remaining deficits: See above.    Education / Equipment: HEP Plan:                                                     Patient goals were not met. Patient is being discharged due to not returning since the last visit.  Thank you for the referral. Earlie Counts, PT 08/11/18 12:43 PM  ?????

## 2018-06-17 ENCOUNTER — Other Ambulatory Visit: Payer: Self-pay | Admitting: Internal Medicine

## 2018-06-24 ENCOUNTER — Other Ambulatory Visit: Payer: Self-pay

## 2018-06-24 ENCOUNTER — Ambulatory Visit (INDEPENDENT_AMBULATORY_CARE_PROVIDER_SITE_OTHER): Payer: Medicare Other | Admitting: Internal Medicine

## 2018-06-24 ENCOUNTER — Encounter: Payer: Self-pay | Admitting: Internal Medicine

## 2018-06-24 VITALS — BP 147/54 | HR 78 | Temp 98.2°F | Ht 64.0 in | Wt 130.2 lb

## 2018-06-24 DIAGNOSIS — I6529 Occlusion and stenosis of unspecified carotid artery: Secondary | ICD-10-CM | POA: Diagnosis not present

## 2018-06-24 DIAGNOSIS — M316 Other giant cell arteritis: Secondary | ICD-10-CM | POA: Diagnosis not present

## 2018-06-24 DIAGNOSIS — K623 Rectal prolapse: Secondary | ICD-10-CM | POA: Diagnosis not present

## 2018-06-24 DIAGNOSIS — I10 Essential (primary) hypertension: Secondary | ICD-10-CM | POA: Diagnosis not present

## 2018-06-24 DIAGNOSIS — E119 Type 2 diabetes mellitus without complications: Secondary | ICD-10-CM | POA: Diagnosis not present

## 2018-06-24 LAB — GLUCOSE, CAPILLARY: GLUCOSE-CAPILLARY: 144 mg/dL — AB (ref 70–99)

## 2018-06-24 MED ORDER — VALSARTAN 160 MG PO TABS: 160.0000 mg | ORAL_TABLET | Freq: Every day | ORAL | 11 refills | Status: DC

## 2018-06-24 MED ORDER — ONETOUCH DELICA LANCETS FINE MISC: 1.0000 | Freq: Three times a day (TID) | 3 refills | Status: DC | PRN

## 2018-06-24 NOTE — Assessment & Plan Note (Signed)
At last check her A1C was 8.1, much higher than previous, likely related to steroids.  She is now off the medication and we reviewed her BS log which showed BS in the 120s - 200s.  She is due for A1C in 1 month.  I advised her to continue current therapy and follow up in 1 month.

## 2018-06-24 NOTE — Assessment & Plan Note (Signed)
This is presumed, given findings.  She is off of prednisone now.  She declined a biopsy for definitive diagnosis.  She has had a recurrence of thigh pain, however, she is recently started working with PT.   Check Sed rate and ESR.  Consider going back on steroids if these are getting higher.

## 2018-06-24 NOTE — Progress Notes (Signed)
   Subjective:    [name], female    DOB: 01/24/1934, 84 y.o.   MRN: [id number]  2 month follow up for HTN and PMR  HPI   Ms. Pair is an 84yo woman with PMH of presumed temporal arteritis, HT, CAD, DM2, Hypothyroidism who presents today for follow up of her PMR and HTN.    She has had difficult to control HTN since being on prednisone.  She has also had issues with dizziness with many medications.  She is currently on valsartan, she thinks it is 80mg  daily and she feels this is working for her without side effects.  Her BP today was high for her and I reviewed her home BP log which showed high blood pressures.  She has had no headache.   She has done well on the prednisone and on review of her notes, she has not had pain in a while.  She is now on no steroids.  She did not have any adrenal insufficiency.  She has had a recurrence of pain in the thighs, but she attributes this to working with physical therapy.    She has recently seen GI who did a colonoscopy and found that she had rectal prolapse and fecal incontinence.  She is going to see pelvic floor PT.   Rectal prolapse  Her DM was poorly controlled while on the steroids.  I reviewed home BS log - 120s - 200s, on glimepiride.  We will check an A1C in a month.  She had no blurry vision, polyuria or polydipsia.   Review of Systems  Constitutional: Negative for activity change, appetite change and unexpected weight change.  Respiratory: Negative for cough and shortness of breath.   Cardiovascular: Negative for chest pain and palpitations.  Gastrointestinal: Negative for abdominal distention, constipation and rectal pain.  Genitourinary: Negative for difficulty urinating and enuresis.  Musculoskeletal: Positive for arthralgias and myalgias. Negative for gait problem.  Neurological: Negative for dizziness, weakness and headaches.       Objective:   Physical Exam  Constitutional: She is oriented to person, place, and  time. She appears well-developed and well-nourished. No distress.  HENT:  Head: Normocephalic and atraumatic.  Cardiovascular: Normal rate and regular rhythm.  No murmur heard. Pulmonary/Chest: Effort normal and breath sounds normal. No respiratory distress.  Musculoskeletal:  She has some muscle pain with palpation of lateral thighs bilaterally.   Neurological: She is alert and oriented to person, place, and time. No sensory deficit. She exhibits normal muscle tone.  Psychiatric: She has a normal mood and affect. Her behavior is normal.  Vitals reviewed.   Sed rate and CRP today.      Assessment & Plan:  RTC in 1 month

## 2018-06-24 NOTE — Assessment & Plan Note (Signed)
Following with pelvic floor PT.

## 2018-06-24 NOTE — Assessment & Plan Note (Signed)
BP has been elevated and still is today, partially related to steroids and also she has had dizziness on multiple therapies in the past.    Plan Increase valsartan to 160mg  daily Continue amlodipine Follow up in 1 month.

## 2018-06-24 NOTE — Patient Instructions (Signed)
Christine [name] - -  For your blood pressure, please start taking Diovan 165m daily.  A new Prescription was sent to your pharmacy.   For your thigh pain, we will check an ESR and CRP today to see if they inflammation from your PMR is back.  I will call you with results.   Please come back to see me in 4 weeks for a blood pressure check.    Thank you!  Valsartan tablets What is this medicine? VALSARTAN (val SAR tan) is used to treat high blood pressure. This drug is also used to treat patients with heart failure and patients who have had a heart attack. This medicine may be used for other purposes; ask your health care provider or pharmacist if you have questions. COMMON BRAND NAME(S): Diovan What should I tell my health care provider before I take this medicine? They need to know if you have any of these conditions: -heart failure -kidney disease -liver disease -an unusual or allergic reaction to valsartan, other medicines, foods, dyes, or preservatives -pregnant or trying to get pregnant -breast-feeding How should I use this medicine? Take this medicine by mouth with a glass of water. Follow the directions on the prescription label. This medicine can be taken with or without food. Take your medicine at regular intervals. Do not take it more often than directed. Talk to your pediatrician regarding the use of this medicine in children. While this drug may be prescribed for children as young as 6 years for selected conditions, precautions do apply. Overdosage: If you think you have taken too much of this medicine contact a poison control center or emergency room at once. NOTE: This medicine is only for you. Do not share this medicine with others. What if I miss a dose? If you miss a dose, take it as soon as you can. If it is almost time for your next dose, take only that dose. Do not take double or extra doses. What may interact with this medicine? -blood pressure  medicines -lithium -diuretics, especially triamterene, spironolactone or amiloride -potassium salts or potassium supplements This list may not describe all possible interactions. Give your health care provider a list of all the medicines, herbs, non-prescription drugs, or dietary supplements you use. Also tell them if you smoke, drink alcohol, or use illegal drugs. Some items may interact with your medicine. What should I watch for while using this medicine? Visit your doctor or health care professional for regular checks on your progress. Check your blood pressure as directed. Ask your doctor or health care professional what your blood pressure should be and when you should contact him or her. Call your doctor or health care professional if you notice an irregular or fast heart beat. Women should inform their doctor if they wish to become pregnant or think they might be pregnant. There is a potential for serious side effects to an unborn child, particularly in the second or third trimester. Talk to your health care professional or pharmacist for more information. You may get drowsy or dizzy. Do not drive, use machinery, or do anything that needs mental alertness until you know how this drug affects you. Do not stand or sit up quickly, especially if you are an older patient. This reduces the risk of dizzy or fainting spells. Alcohol can make you more drowsy and dizzy. Avoid alcoholic drinks. Avoid salt substitutes unless you are told otherwise by your doctor or health care professional. Do not treat yourself for coughs, colds, or  pain while you are taking this medicine without asking your doctor or health care professional for advice. Some ingredients may increase your blood pressure. What side effects may I notice from receiving this medicine? Side effects that you should report to your doctor or health care professional as soon as possible: -confusion, dizziness, light headedness or fainting  spells -decreased amount of urine passed -difficulty breathing or swallowing, hoarseness, or tightening of the throat -fast or irregular heart beat, palpitations, or chest pain -skin rash, itching -swelling of your face, lips, tongue, hands, or feet Side effects that usually do not require medical attention (report to your doctor or health care professional if they continue or are bothersome): -cough -decreased sexual function -headache -nausea or stomach pain This list may not describe all possible side effects. Call your doctor for medical advice about side effects. You may report side effects to FDA at 1-800-FDA-1088. Where should I keep my medicine? Keep out of the reach of children. Store at room temperature between 15 and 30 degrees C (59 and 86 degrees F). Keep your medicine container tightly closed and protect from moisture. Throw away any unused medicine after the expiration date. NOTE: This sheet is a summary. It may not cover all possible information. If you have questions about this medicine, talk to your doctor, pharmacist, or health care provider.  2018 Elsevier/Gold Standard (2013-02-04 12:39:59)

## 2018-06-25 ENCOUNTER — Telehealth: Payer: Self-pay | Admitting: Gastroenterology

## 2018-06-25 LAB — C-REACTIVE PROTEIN: CRP: 18 mg/L — ABNORMAL HIGH (ref 0–10)

## 2018-06-25 LAB — SEDIMENTATION RATE: Sed Rate: 44 mm/hr — ABNORMAL HIGH (ref 0–40)

## 2018-06-25 NOTE — Telephone Encounter (Signed)
Spoke to patient, went back over the colonoscopy results (path results), patient understands and is reassured.

## 2018-06-26 ENCOUNTER — Telehealth: Payer: Self-pay | Admitting: Internal Medicine

## 2018-06-26 DIAGNOSIS — M316 Other giant cell arteritis: Secondary | ICD-10-CM

## 2018-06-26 MED ORDER — PREDNISONE 5 MG PO TABS: 10.0000 mg | ORAL_TABLET | Freq: Every day | ORAL | 0 refills | Status: DC

## 2018-06-26 NOTE — Telephone Encounter (Signed)
Called and spoke with [name].   I think her pain and rising ESR are signs of return of her PMR.  I think her steroid wean was too quick for her and would like her to start back on 80m prednisone and we will do a 176mtaper over months.  I will send in an Rx for 1066mrednisone for 30 days.   She expressed understanding and will pick up Rx next week.   EmiGilles ChiquitoD

## 2018-07-01 ENCOUNTER — Encounter: Payer: Self-pay | Admitting: Physical Therapy

## 2018-07-03 ENCOUNTER — Encounter: Payer: Self-pay | Admitting: Physical Therapy

## 2018-07-06 ENCOUNTER — Ambulatory Visit: Payer: Medicare Other | Admitting: Physical Therapy

## 2018-07-08 ENCOUNTER — Encounter: Payer: Self-pay | Admitting: Physical Therapy

## 2018-07-13 ENCOUNTER — Encounter: Payer: Medicare Other | Admitting: Physical Therapy

## 2018-07-14 ENCOUNTER — Encounter: Payer: Self-pay | Admitting: Physical Therapy

## 2018-07-15 ENCOUNTER — Telehealth: Payer: Self-pay | Admitting: Physical Therapy

## 2018-07-15 NOTE — Telephone Encounter (Signed)
Returned patient phone call and left a message.  Earlie Counts, PT @8 /28/2019@ 12:09 PM

## 2018-07-16 ENCOUNTER — Encounter: Payer: Self-pay | Admitting: Physical Therapy

## 2018-07-21 ENCOUNTER — Encounter: Payer: Self-pay | Admitting: Physical Therapy

## 2018-07-23 ENCOUNTER — Encounter: Payer: Self-pay | Admitting: Physical Therapy

## 2018-07-27 ENCOUNTER — Telehealth: Payer: Self-pay | Admitting: Gastroenterology

## 2018-07-28 ENCOUNTER — Ambulatory Visit: Payer: Medicare Other | Admitting: Dietician

## 2018-07-28 ENCOUNTER — Encounter (INDEPENDENT_AMBULATORY_CARE_PROVIDER_SITE_OTHER): Payer: Self-pay

## 2018-07-28 ENCOUNTER — Other Ambulatory Visit: Payer: Self-pay

## 2018-07-28 ENCOUNTER — Encounter: Payer: Self-pay | Admitting: Internal Medicine

## 2018-07-28 ENCOUNTER — Ambulatory Visit (INDEPENDENT_AMBULATORY_CARE_PROVIDER_SITE_OTHER): Payer: Medicare Other | Admitting: Internal Medicine

## 2018-07-28 VITALS — BP 145/65 | HR 85 | Temp 98.7°F | Ht 64.0 in | Wt 128.0 lb

## 2018-07-28 DIAGNOSIS — Z79899 Other long term (current) drug therapy: Secondary | ICD-10-CM

## 2018-07-28 DIAGNOSIS — I10 Essential (primary) hypertension: Secondary | ICD-10-CM | POA: Diagnosis not present

## 2018-07-28 DIAGNOSIS — M353 Polymyalgia rheumatica: Secondary | ICD-10-CM | POA: Diagnosis not present

## 2018-07-28 DIAGNOSIS — E119 Type 2 diabetes mellitus without complications: Secondary | ICD-10-CM | POA: Diagnosis not present

## 2018-07-28 DIAGNOSIS — Z7952 Long term (current) use of systemic steroids: Secondary | ICD-10-CM | POA: Diagnosis not present

## 2018-07-28 DIAGNOSIS — Z8679 Personal history of other diseases of the circulatory system: Secondary | ICD-10-CM | POA: Diagnosis not present

## 2018-07-28 DIAGNOSIS — H5711 Ocular pain, right eye: Secondary | ICD-10-CM

## 2018-07-28 DIAGNOSIS — Z7902 Long term (current) use of antithrombotics/antiplatelets: Secondary | ICD-10-CM

## 2018-07-28 MED ORDER — CALCIUM POLYCARBOPHIL 625 MG PO TABS: 625.0000 mg | ORAL_TABLET | Freq: Every day | ORAL | 2 refills | Status: DC

## 2018-07-28 MED ORDER — ONETOUCH ULTRA BLUE VI STRP: 1.0000 | ORAL_STRIP | Freq: Two times a day (BID) | 2 refills | Status: DC

## 2018-07-28 NOTE — Telephone Encounter (Signed)
Patient advised that we have refilled her Fibercon.

## 2018-07-28 NOTE — Telephone Encounter (Signed)
Patient is requesting a refill on her Fibercon 625 mg 1 tablet daily. She states this helps with her constipation. She had recently taken some pain medication and this caused her to be constipated.

## 2018-07-28 NOTE — Progress Notes (Signed)
CC: Right eye pain since this morning.  HPI:  [name] is a 84 y.o. with past medical history more significant for polymyalgia rheumatica on prednisone, hypertension and diabetes came to the clinic with complaint of pain behind right eye started this morning.  Patient started having some pain behind the right eye since this morning.  She denies any headache, change in vision, jaw claudication, nausea or vomiting.  No increased lacrimation.  No pain with eye movement.  No tenderness around the eye.  Patient was concerned about temporal arteritis as she had one approximately 10 years ago, there was some similar symptoms in May 2019 which was treated with high-dose steroid with a taper.  Patient does not want to have a definitive diagnosis  with biopsy. She denies any worsening of her polymyalgia symptoms, having some thigh pain during position change from sitting to standing.  No difficulty with walking and she was able to tolerate physical therapy very well.  She was also complaining of left leg pain and swelling last week after doing some exercises at physical therapy, swelling and pain has completely subsided now.  Please see assessment and plan for her conditions.   Past Medical History:  Diagnosis Date  . Anemia   . Anginal pain ([place])   . Arthritis    "back, arms, hips; hands" (11/30/2015)  . Benign hypertensive heart and kidney disease with diastolic CHF, NYHA class II and CKD stage III ([place])   . Blood transfusion 1972   "after daughter born, attempted to give me blood; couldn't give it cause my blood was cold" (09/23/2013)  . CAD (coronary artery disease)    a. LHC 08/23/11: dLM 40-50%, oRI 40%, oCFX 40%, oD1 70% (small and not amenable to PCI).  LM lesion did not appear to be flow limiting.  Medical rx was recommended;  b. Echo 08/23/11: mild LVH, EF 60-65%, grade 1 diast dysfxn, mild BAE, PASP 24;  c. 02/2012  Cath: LM 50d, LAD 50p, D1 70ost, RI 70p, RCA ok->Med Rx;  d. 01/2013  Cardiolite: EF 88, no ischemia/infarct.  . Carotid stenosis    a. dopplers 10/12:  0-39% bilat ICA;  b. 10/2012 U/S: 0-39% bilat, f/u 1 yr (10/2013).  . Chronic bronchitis   . Depression   . Diverticulosis of colon (without mention of hemorrhage)   . GERD (gastroesophageal reflux disease)   . Heart murmur, systolic    2-D echo in December 2011 showed a normal EF with grade 1 diastolic dysfunction, trivial pulmonary regurgitation and mildly elevated PA pressure at 37 mmHg probably secondary to her COPD.dynamic obstruction-mid cavity obliteration;  b. 02/2012 Echo: EF 60-65%, mild LVH, PASP 59mmHg.  Marland Kitchen History of stomach ulcers 1970's  . Hyperlipidemia   . Hypertension   . Hypothyroidism   . Migraines    a. Next atypical symptoms in the past. Patient was started on Neurontin for possible neuropathic origin of her pain.  . Polymyalgia rheumatica ([place])   . Shortness of breath on exertion    "just related to angina >1 yr ago" (09/23/2013)  . Sinus arrhythmia   . Type II diabetes mellitus ([place])    a. On oral hypoglycemic agents.   Review of Systems: Negative except mentioned in HPI.  Physical Exam:  Vitals:   07/28/18 1414  BP: (!) 145/65  Pulse: 85  Temp: 98.7 F (37.1 C)  TempSrc: Oral  SpO2: 95%  Weight: 128 lb (58.1 kg)  Height: 5\' 4"  (1.626 m)   General:  Vital signs reviewed.  Patient is well-developed and well-nourished, in no acute distress and cooperative with exam.  Head: Normocephalic and atraumatic. Eyes: EOMI, conjunctivae normal, no scleral icterus. No diplopia, peripheral field of vision within normal limit.  No orbital or temporal tenderness. Cardiovascular: Regular rate and rhythm. Pulmonary/Chest: Clear to auscultation bilaterally, no wheezes, rales, or rhonchi. Abdominal: Soft, non-tender, non-distended, BS +,  Extremities: No lower extremity edema bilaterally,  pulses symmetric and intact bilaterally. No cyanosis or clubbing. Neurological: A&O x3, Strength is  normal and symmetric bilaterally, cranial nerve II-XII are grossly intact, no focal motor deficit, sensory intact to light touch bilaterally.  Skin: Warm, dry and intact. No rashes or erythema. Psychiatric: Normal mood and affect. speech and behavior is normal. Cognition and memory are normal.  Assessment & Plan:   See Encounters Tab for problem based charting.  Patient discussed with Dr. Dareen Piano.

## 2018-07-28 NOTE — Patient Instructions (Signed)
Thank you for visiting clinic today. My suspicion about temporal arteritis is low at this time based on your symptoms and my exam.  You are at high risk because of polymyalgia rheumatica. You are already on prednisone 10 mg daily-please continue taking that. I will check your blood levels today-we will call you with any abnormal results. If your symptoms get worse or fail to resolve with the next week please follow-up in clinic.

## 2018-07-28 NOTE — Telephone Encounter (Signed)
That's fine, if that works for her we can refill it. Thanks

## 2018-07-29 ENCOUNTER — Other Ambulatory Visit: Payer: Self-pay | Admitting: Pharmacist

## 2018-07-29 DIAGNOSIS — E119 Type 2 diabetes mellitus without complications: Secondary | ICD-10-CM

## 2018-07-29 LAB — C-REACTIVE PROTEIN: CRP: 2 mg/L (ref 0–10)

## 2018-07-29 LAB — SEDIMENTATION RATE: Sed Rate: 25 mm/hr (ref 0–40)

## 2018-07-29 NOTE — Assessment & Plan Note (Signed)
Patient is having very nonspecific right eye pain with no red flags. She was very concerned about temporal arteritis because of her previous history and polymyalgia rheumatica.  She does not want any definitive diagnosis with biopsy.  Her prednisone at 10 mg daily was restarted approximately a month ago because of worsening of her type pain and elevated ESR and CRP.  Since then her thigh pain has improved.  Her symptoms and exam is not concerning for temporal arteritis at this time. ESR and CRP was checked which was within normal limit today-increased a month ago when her prednisone was restarted. She was advised to continue taking prednisone at 10 mg daily and does not need any high dose at this time. She develops worsening of her symptoms, headaches or jaw pain she was advised to contact clinic.

## 2018-07-29 NOTE — Assessment & Plan Note (Signed)
BP Readings from Last 3 Encounters:  07/28/18 (!) 145/65  06/24/18 (!) 147/54  05/27/18 133/66   Her blood pressure was elevated today. Patient is on amlodipine 10 mg, metoprolol 25 mg twice daily And valsartan 160 mg daily. She is also on prednisone 10 mg daily because of her polymyalgia rheumatica.  We will continue with current management as patient was also experiencing some pain. Will reevaluate during next follow-up visit.

## 2018-07-29 NOTE — Assessment & Plan Note (Signed)
She was asking for refill for her test strips which was provided.

## 2018-07-30 ENCOUNTER — Encounter: Payer: Self-pay | Admitting: Cardiovascular Disease

## 2018-07-30 ENCOUNTER — Ambulatory Visit (INDEPENDENT_AMBULATORY_CARE_PROVIDER_SITE_OTHER): Payer: Medicare Other | Admitting: Cardiovascular Disease

## 2018-07-30 VITALS — BP 160/62 | HR 78 | Ht 64.0 in | Wt 126.8 lb

## 2018-07-30 DIAGNOSIS — R0602 Shortness of breath: Secondary | ICD-10-CM | POA: Diagnosis not present

## 2018-07-30 DIAGNOSIS — I25119 Atherosclerotic heart disease of native coronary artery with unspecified angina pectoris: Secondary | ICD-10-CM

## 2018-07-30 DIAGNOSIS — Z5181 Encounter for therapeutic drug level monitoring: Secondary | ICD-10-CM | POA: Diagnosis not present

## 2018-07-30 DIAGNOSIS — R700 Elevated erythrocyte sedimentation rate: Secondary | ICD-10-CM

## 2018-07-30 DIAGNOSIS — I6529 Occlusion and stenosis of unspecified carotid artery: Secondary | ICD-10-CM

## 2018-07-30 DIAGNOSIS — I10 Essential (primary) hypertension: Secondary | ICD-10-CM

## 2018-07-30 MED ORDER — HYDROCHLOROTHIAZIDE 12.5 MG PO TABS: 12.5000 mg | ORAL_TABLET | Freq: Every day | ORAL | 1 refills | Status: DC

## 2018-07-30 NOTE — Patient Instructions (Signed)
Medication Instructions:  START HYDROCHLOROTHIAZIDE 12.5 MG DAILY   Labwork: BMET IN 1 WEEK   Testing/Procedures: NONE  Follow-Up: Your physician recommends that you schedule a follow-up appointment in: 4-6 WEEKS   If you need a refill on your cardiac medications before your next appointment, please call your pharmacy.

## 2018-07-30 NOTE — Progress Notes (Signed)
Cardiology Office Note   Date:  07/30/2018   ID:  Christine [name], DOB 01/24/1934, MRN [id number]  PCP:  [name], MD  Cardiologist:   Skeet Latch, MD   No chief complaint on file.    History of Present Illness: Christine [name] is an 84 y.o. female with CAD, carotid stenosis, hypertension, hyperlipidemia, hypothyroidism, temporal arteritis, and diabetes who presents for follow up.  Christine [name] was previously a patient of Dr. Aundra Dubin.  She initially established care 08/2011 when she presented with chest pain. She had a left heart catheterization that revealed 40-50% left main stenosis and a 75% diagonal lesion that were medically managed.  She had a repeat cath 09/2015 that was unchanged.  CPX 10/2015 revealed mild obstructive pulmonary disease and severe chronotropic incompetence.  Metoprolol was decreased.  There was also concern for possible exercise-induced pulmonary hypertension or functional MR.  Christine [name] had a 48 hour Holter 03/2016 that showed occasional PACs and PVCs.  In 2017 she had orthostatic hypotension.  She was started on compression stockings and pyridostigmine. She did not tolerate this due to dizziness.  She saw Rosaria Ferries, PA-C, on 07/2017 and reported exertional dyspnea.  She also noted mild chest pain.  She was referred for heart catheterization that revealed no significant changes from prior.  She had 40% left main, 70% ostial D1, and 10% proximal RCA disease.  LVEF was 55-65%.  LVEDP was normal.  Echo 11/07/17 revealed LVEF 65-70% and grade 1 diastolic dysfunction.  PASP was 40 mmHg.  She had aortic valve sclerosis without stenosis.  Christine [name] followed up with Dr. Vaughan Browner on 09/15/17 and continued to report shortness of breath and fatigue.  She was referred for a CPX 09/26/17 that again revealed chronotropic incompetence.  Her heart rate increased from 61 bpm at rest to 87 at max.  Between seeing her pulmonologist and actually having the CPX her metoprolol was  increased to 25 mg daily.  She continued to have significant fatigue with exertion. Metoprolol was discontinued.  Christine [name] was diagnosed with temporal arteritis and her fatigue also improved after treatment.  However she continued to have some dizziness.  Since her last appointment Christine [name] has struggled with fecal incontinence.  She was evaluated by GI and had a colonoscopy that revealed rectal prolapse.  She has been doing pelvic floor PT.  She has been struggling with pain in her thighs.  Her ESR has been elevated and she was diagnosed with PMR.  She has been she receiving treatment with prednisone which has helped.  At her last appointment she was feeling dizzy so irbesartan was reduced.  She never made that change.  Since being treated with prednisone her blood pressure has been elevated.  She was switched back to valsartan and her dizziness improved.  She watches the salt in her diet and limits her caloric intake.  She has not been exercising much due to the pain in her thighs.  Her blood pressure at home has been mostly in the 120s to 160s over 70s to 90s.     Past Medical History:  Diagnosis Date  . Anemia   . Anginal pain ([place])   . Arthritis    "back, arms, hips; hands" (11/30/2015)  . Benign hypertensive heart and kidney disease with diastolic CHF, NYHA class II and CKD stage III ([place])   . Blood transfusion 1972   "after daughter born, attempted to give me blood; couldn't give it cause my blood  was cold" (09/23/2013)  . CAD (coronary artery disease)    a. LHC 08/23/11: dLM 40-50%, oRI 40%, oCFX 40%, oD1 70% (small and not amenable to PCI).  LM lesion did not appear to be flow limiting.  Medical rx was recommended;  b. Echo 08/23/11: mild LVH, EF 60-65%, grade 1 diast dysfxn, mild BAE, PASP 24;  c. 02/2012  Cath: LM 50d, LAD 50p, D1 70ost, RI 70p, RCA ok->Med Rx;  d. 01/2013 Cardiolite: EF 88, no ischemia/infarct.  . Carotid stenosis    a. dopplers 10/12:  0-39% bilat ICA;  b. 10/2012 U/S:  0-39% bilat, f/u 1 yr (10/2013).  . Chronic bronchitis   . Depression   . Diverticulosis of colon (without mention of hemorrhage)   . GERD (gastroesophageal reflux disease)   . Heart murmur, systolic    2-D echo in December 2011 showed a normal EF with grade 1 diastolic dysfunction, trivial pulmonary regurgitation and mildly elevated PA pressure at 37 mmHg probably secondary to her COPD.dynamic obstruction-mid cavity obliteration;  b. 02/2012 Echo: EF 60-65%, mild LVH, PASP 76mHg.  .Marland KitchenHistory of stomach ulcers 1970's  . Hyperlipidemia   . Hypertension   . Hypothyroidism   . Migraines    a. Next atypical symptoms in the past. Patient was started on Neurontin for possible neuropathic origin of her pain.  . [place]   . Shortness of breath on exertion    "just related to angina >1 yr ago" (09/23/2013)  . Sinus arrhythmia   . Type II diabetes mellitus [place]    a. On oral hypoglycemic agents.    Past Surgical History:  Procedure Laterality Date  . CARDIAC CATHETERIZATION N/A 09/26/2015   Procedure: Right/Left Heart Cath and Coronary Angiography;  Surgeon: DLarey Dresser MD;  [facility];  Service: Cardiovascular;  Laterality: N/A;  . CATARACT EXTRACTION W/ INTRAOCULAR LENS  IMPLANT, BILATERAL Bilateral 1990's  . LEFT HEART CATHETERIZATION WITH CORONARY ANGIOGRAM N/A 03/16/2012   Procedure: LEFT HEART CATHETERIZATION WITH CORONARY ANGIOGRAM;  Surgeon: THillary Bow MD;  [facility];  Service: Cardiovascular;  Laterality: N/A;  . RIGHT/LEFT HEART CATH AND CORONARY ANGIOGRAPHY N/A 08/12/2017   Procedure: RIGHT/LEFT HEART CATH AND CORONARY ANGIOGRAPHY;  Surgeon: JMartinique Peter M, MD;  [facility];  Service: Cardiovascular;  Laterality: N/A;  . VAGINAL HYSTERECTOMY  1976     Current Outpatient Medications  Medication Sig Dispense Refill  . acetaminophen (TYLENOL) 500 MG tablet Take 1,000 mg by mouth every 6 (six) hours as  needed for headache. For pain    . ADVAIR DISKUS 250-50 MCG/DOSE AEPB Inhale 1 puff into the lungs 2 (two) times daily.   7  . amLODipine (NORVASC) 10 MG tablet take 1 tablet by mouth once daily 90 tablet 3  . Ascorbic Acid (VITAMIN C PO) Take 1 tablet by mouth daily.     .Marland Kitchenatorvastatin (LIPITOR) 40 MG tablet Take 0.5 tablets (20 mg total) by mouth every evening. 90 tablet 1  . clopidogrel (PLAVIX) 75 MG tablet TAKE 1 TABLET BY MOUTH EVERY EVENING 90 tablet 3  . esomeprazole (NEXIUM) 20 MG capsule TAKE 1 CAPSULE BY MOUTH ONCE DAILY OR EVERY OTHER DAY AS DIRECTED 90 capsule 0  . fluticasone (FLONASE) 50 MCG/ACT nasal spray Place 1 spray into both nostrils daily. 16 g 2  . glimepiride (AMARYL) 1 MG tablet take 1 tablet by mouth once daily BEFORE BREAKFAST 90 tablet 3  . imipramine (TOFRANIL) 50  MG tablet Take 1 tablet (50 mg total) by mouth 2 (two) times daily. 180 tablet 1  . levothyroxine (SYNTHROID, LEVOTHROID) 25 MCG tablet Take 1 tablet (25 mcg total) by mouth daily. 90 tablet 1  . Multiple Vitamin (MULTIVITAMIN WITH MINERALS) TABS tablet Take 1 tablet by mouth daily. 30 tablet 2  . ONE TOUCH ULTRA TEST test strip 1 each by Other route 2 (two) times daily. 100 each 2  . ONETOUCH DELICA LANCETS FINE MISC Inject 1 Syringe into the skin 3 (three) times daily as needed (blood sugar check). 200 each 3  . polycarbophil (FIBERCON) 625 MG tablet Take 1 tablet (625 mg total) by mouth daily. 30 tablet 2  . predniSONE (DELTASONE) 5 MG tablet Take 2 tablets (10 mg total) by mouth daily with breakfast. 60 tablet 0  . valsartan (DIOVAN) 160 MG tablet Take 1 tablet (160 mg total) by mouth daily. 30 tablet 11   No current facility-administered medications for this visit.     Allergies:   Aspirin; Atarax [hydroxyzine]; Nsaids; Codeine; Haldol [haloperidol]; Other; and Penicillins    Social History:  The patient  reports that she has never smoked. She has never used smokeless tobacco. She reports that she  does not drink alcohol or use drugs.   Family History:  The patient's family history includes COPD in her father; Colon cancer in her maternal grandmother; Heart attack in her maternal grandmother; Other in her mother; Pulmonary Hypertension in her daughter.    ROS:  Please see the history of present illness.   Otherwise, review of systems are positive for none.   All other systems are reviewed and negative.    PHYSICAL EXAM: VS:  BP (!) 160/62   Pulse 78   Ht _0  (1.626 m)   Wt 126 lb 12.8 oz (57.5 kg)   LMP 02/11/1975   SpO2 99%   BMI 21.77 kg/m  , BMI Body mass index is 21.77 kg/m. GENERAL:  Well appearing HEENT: Pupils equal round and reactive, fundi not visualized, oral mucosa unremarkable NECK:  No jugular venous distention, waveform within normal limits, carotid upstroke brisk and symmetric, no bruits LUNGS:  Clear to auscultation bilaterally HEART:  RRR.  PMI not displaced or sustained,S1 and S2 within normal limits, no S3, no S4, no clicks, no rubs, no murmurs ABD:  Flat, positive bowel sounds normal in frequency in pitch, no bruits, no rebound, no guarding, no midline pulsatile mass, no hepatomegaly, no splenomegaly EXT:  2 plus pulses throughout, no edema, no cyanosis no clubbing SKIN:  No rashes no nodules NEURO:  Cranial nerves II through XII grossly intact, motor grossly intact throughout PSYCH:  Cognitively intact, oriented to person place and time   EKG:  EKG is ordered today. 10/22/17: Sinus rhythm.  Rate 72 bpm.  Prior septal infarct.  LAD.  07/30/2018: Sinus rhythm.  Cannot rule out prior septal infarct.  48 hour Holter 04/12/16: Occasional PACs and PVCs, no worrisome arrhythmias.  CPX 09/26/17: Exercise testing with gas exchange demonstrates moderately reduced functional impairment when compared to matched sedentary norms. There is mild indication for circulatory limitation given elevated VE/VCO2 slope. There was significant chronotropic incompetence. Despite  patient's symptoms, results have mildly increased and improved with similar limitations as CPX in 2016.  Heart rate increased from 61 bpm at baseline to 87 bpm at peak stress.  RHC/LHC 08/12/17:  Ost LM lesion, 40 %stenosed.  LM lesion, 40 %stenosed.  Ost 1st Diag to 1st Diag lesion, 70 %stenosed.  Prox RCA to Mid RCA lesion, 10 %stenosed.  The left ventricular systolic function is normal.  LV end diastolic pressure is normal.  The left ventricular ejection fraction is 55-65% by visual estimate.  LV end diastolic pressure is normal.   1. Nonobstructive CAD. Unchanged from 2013 and 2016.  2. Normal LV function 3. Normal LV filling pressures 4. Normal right heart pressures  Echo 09/19/15:  Study Conclusions  - Left ventricle: The cavity size was normal. Wall thickness was   increased in a pattern of moderate LVH. Systolic function was   normal. The estimated ejection fraction was in the range of 60%   to 65%. Wall motion was normal; there were no regional wall   motion abnormalities. Left ventricular diastolic function   parameters were normal. - Atrial septum: No defect or patent foramen ovale was identified.  48 Hour Holter Monitor 12/01/16:  Quality: Fair.  Baseline artifact. Predominant rhythm: sinus rhythm Average heart rate: 83 bpm Max heart rate: 68 bpm Min heart rate: 109 bpm  PACs and PVCs noted <1% PVCs Atrial bigeminy  Recent Labs: 08/20/2017: TSH 2.792 11/26/2017: ALT 14 03/07/2018: BUN 21; Creatinine, Ser 1.19; Hemoglobin 12.0; Platelets 373; Potassium 4.7; Sodium 134   03/07/2018: Potassium 4.7, creatinine 1.19 Hemoglobin 12  Lipid Panel    Component Value Date/Time   CHOL 146 06/13/2017 0804   TRIG 268 (H) 06/13/2017 0804   HDL 46 06/13/2017 0804   CHOLHDL 3.2 06/13/2017 0804   CHOLHDL 4 08/05/2014 0933   VLDL 33.8 08/05/2014 0933   LDLCALC 46 06/13/2017 0804      Wt Readings from Last 3 Encounters:  07/30/18 126 lb 12.8 oz (57.5 kg)    07/28/18 128 lb (58.1 kg)  06/24/18 130 lb 3.2 oz (59.1 kg)      ASSESSMENT AND PLAN:  # Exertional dyspnea:  # Chronotropic incompetence:  Heart rate stable and she is doing well. She is no longer on metoprolol.  # Medically managed CAD:  Christine [name] has known left main and diagonal lesions that are medically managed and unchanged from 2013. Her symptoms are not consistent with angina.  Continue amlodipine, clopidogrel, and atorvastatin. She is not on a beta blocker 2/2 chronotropic incompetence.  # Hypertension: Blood pressure is above goal.  She has been on prednisone for PMR.  She is feeling better since switching from irbesartan to valsartan.  We will try adding HCTZ 12.5 mg in the morning.  Continue amlodipine.   # Hyperlipidemia: LDL 46 on 05/2017.  Triglycerides are elevated.  Continue atorvastatin.    Current medicines are reviewed at length with the patient today.  The patient does not have concerns regarding medicines.  The following changes have been made: Add HCTZ 12.5 mg.  Labs/ tests ordered today include:   No orders of the defined types were placed in this encounter.   Disposition:   FU with Zorawar Strollo C. Oval Linsey, MD, [facility] in in 4-6 weeks.     Signed, Derin Matthes C. Oval Linsey, MD, [facility]  07/30/2018 10:29 AM    [facility]

## 2018-07-31 NOTE — Progress Notes (Signed)
Internal Medicine Clinic Attending  Case discussed with Dr. Amin at the time of the visit.  We reviewed the resident's history and exam and pertinent patient test results.  I agree with the assessment, diagnosis, and plan of care documented in the resident's note.    

## 2018-08-03 ENCOUNTER — Ambulatory Visit: Payer: Medicare Other | Admitting: Physical Therapy

## 2018-08-04 ENCOUNTER — Encounter: Payer: Self-pay | Admitting: Cardiovascular Disease

## 2018-08-05 ENCOUNTER — Encounter: Payer: Self-pay | Admitting: Dietician

## 2018-08-05 ENCOUNTER — Encounter: Payer: Self-pay | Admitting: Internal Medicine

## 2018-08-05 ENCOUNTER — Ambulatory Visit (INDEPENDENT_AMBULATORY_CARE_PROVIDER_SITE_OTHER): Payer: Medicare Other | Admitting: Dietician

## 2018-08-05 ENCOUNTER — Ambulatory Visit (INDEPENDENT_AMBULATORY_CARE_PROVIDER_SITE_OTHER): Payer: Medicare Other | Admitting: Internal Medicine

## 2018-08-05 ENCOUNTER — Other Ambulatory Visit: Payer: Self-pay

## 2018-08-05 VITALS — BP 147/58 | HR 90 | Temp 98.1°F | Ht 64.0 in | Wt 126.3 lb

## 2018-08-05 DIAGNOSIS — Z713 Dietary counseling and surveillance: Secondary | ICD-10-CM | POA: Diagnosis not present

## 2018-08-05 DIAGNOSIS — R42 Dizziness and giddiness: Secondary | ICD-10-CM | POA: Diagnosis not present

## 2018-08-05 DIAGNOSIS — Z6821 Body mass index (BMI) 21.0-21.9, adult: Secondary | ICD-10-CM

## 2018-08-05 DIAGNOSIS — Z7984 Long term (current) use of oral hypoglycemic drugs: Secondary | ICD-10-CM | POA: Diagnosis not present

## 2018-08-05 DIAGNOSIS — I10 Essential (primary) hypertension: Secondary | ICD-10-CM | POA: Diagnosis not present

## 2018-08-05 DIAGNOSIS — M315 Giant cell arteritis with polymyalgia rheumatica: Secondary | ICD-10-CM | POA: Diagnosis not present

## 2018-08-05 DIAGNOSIS — Z7952 Long term (current) use of systemic steroids: Secondary | ICD-10-CM

## 2018-08-05 DIAGNOSIS — Z79899 Other long term (current) drug therapy: Secondary | ICD-10-CM

## 2018-08-05 DIAGNOSIS — M316 Other giant cell arteritis: Secondary | ICD-10-CM

## 2018-08-05 DIAGNOSIS — Z7902 Long term (current) use of antithrombotics/antiplatelets: Secondary | ICD-10-CM

## 2018-08-05 DIAGNOSIS — E119 Type 2 diabetes mellitus without complications: Secondary | ICD-10-CM

## 2018-08-05 DIAGNOSIS — Z23 Encounter for immunization: Secondary | ICD-10-CM | POA: Diagnosis not present

## 2018-08-05 LAB — POCT GLYCOSYLATED HEMOGLOBIN (HGB A1C): HEMOGLOBIN A1C: 7.4 % — AB (ref 4.0–5.6)

## 2018-08-05 LAB — GLUCOSE, CAPILLARY: Glucose-Capillary: 135 mg/dL — ABNORMAL HIGH (ref 70–99)

## 2018-08-05 MED ORDER — PREDNISONE 1 MG PO TABS: 3.0000 mg | ORAL_TABLET | Freq: Every day | ORAL | 0 refills | Status: DC

## 2018-08-05 MED ORDER — PREDNISONE 5 MG PO TABS: 5.0000 mg | ORAL_TABLET | Freq: Every day | ORAL | 0 refills | Status: DC

## 2018-08-05 NOTE — Progress Notes (Signed)
Documentation for Freestyle Libre Pro Continuous glucose monitoring Freestyle Libre Pro CGM sensor placed and started on Christine [name] who was identified by name and date of birth.  Patient was educated about wearing sensor, keeping food, activity and medication log and when to call office.She was educated about how to care for the sensor and not to have an MRI, CT or Diathermy while wearing the sensorwhat she can learn from the Continuous glucose monitoring. This education took 20 minutes. From 925 to 945 AM. Follow up was arranged with the patient for 1 week to see a doctor and diabetes educator.  Lot #:[id number] Serial #:[id number] Expiration Date:01/16/2019 Debera Lat, RD 08/05/2018 9:33 AM   Initial visit:  MNT1  Medical Nutrition Therapy:  Appt start time: 0945 end time:  1010.  Assessment:  Primary concerns today: Meal planning.  [name] ask for help with what foods to eat for constipation. She currently has to take Miralax to have a bowel movement and also takes Benefiber sometimes Usual eating pattern includes 3 meals and 0-2 snacks per day.   Everyday foods include whole milk, cheerios, fruit cocktail and applesauce, greens, whole wheat bread.   24-hr recall: B ( AM)- cereal and milk  ; L ( PM)- sandwich D ( PM)- Greens, fruit cocktail, chicken  Usual physical activity includes does not walk or move much anymore because of past history of syncope. Estimated body mass index is 21.68 kg/m as calculated from the following:   Height as of an earlier encounter on 08/05/18: 5\' 4"  (1.626 m).   Weight as of an earlier encounter on 08/05/18: 126 lb 4.8 oz (57.3 kg).  Wt Readings from Last 5 Encounters:  08/05/18 126 lb 4.8 oz (57.3 kg)  07/30/18 126 lb 12.8 oz (57.5 kg)  07/28/18 128 lb (58.1 kg)  06/24/18 130 lb 3.2 oz (59.1 kg)  05/27/18 131 lb (59.4 kg)    Lab Results  Component Value Date   HGBA1C 7.4 (A) 08/05/2018   MEDICATIONS amaryl 1 mg daily with breakfast, prednisone 8 mg  with breakfast  starting today for  More than 21 days for temporal arteritis  Progress Towards Goal(s):  In progress.   Nutritional Diagnosis:  NI-5.8.5 Inadeqate fiber intake As related to having constipation and reports low fiber foods.  As evidenced by her food recall and report.    Intervention:  Nutrition education about increasing fiber and exercise gradually to ease side effects and maintain a high fiber diet.  Monitoring/Evaluation:  Dietary intake, exercise, meter, and body weight in 1 week(s)  Debera Lat, RD 08/05/2018 11:48 AM. .

## 2018-08-05 NOTE — Progress Notes (Signed)
   Subjective:    [name], female    DOB: 01/24/1934, 84 y.o.   MRN: [id number]  CC: 1 month follow up for DM and HTN  HPI  Christine [name] is an 84yo woman with PMH of DM2, PMR and HTN.  She was seen a month ago and found to have increasing A1C, likely related to prednisone dosing, along with an elevated BP.  She was seen a few weeks ago for eye pain which seemed mild based on evaluation by physician at the time.  She also saw her Cardiologist on 07/30/18 and she had hctz added to her regimen.  Today her BP is 147/58 and on recheck 119/48.  She took HCTZ for 3 days and then developed severe dizziness and fatigue and so stopped the medication.  She would like to not take the medication going forward.  She has had sensitivity to medications in the past and orthostasis which has been attributed to beta blockers  Today her A1C is 7.4 which is improved from 8.1 at last check.  Given age, this is likely a good level.  She is going to get CGM placed today for possible hypoglycemia unawareness.   For her PMR, most recent inflammatory markers were trending down.  I will plan to start weaning her prednisone today at a slower rate.  I will ask her to take 8mg  daily for 1 month and see her back in clinic again for this issue.    She apparently has been seen for a colonoscopy and rectal prolapse at [facility] and is having pelvic floor PT for this.  I do not readily see any records, so will request records today.   Review of Systems  Constitutional: Positive for fatigue. Negative for activity change and appetite change.  Gastrointestinal: Negative for constipation, diarrhea and rectal pain.  Musculoskeletal: Negative for arthralgias and myalgias.  Neurological: Positive for dizziness, weakness and light-headedness. Negative for headaches.       Objective:   Physical Exam  Constitutional: She is oriented to person, place, and time. She appears well-developed and well-nourished.  HENT:  Head:  Normocephalic and atraumatic.  Eyes: Right eye exhibits no discharge. Left eye exhibits no discharge. No scleral icterus.  Cardiovascular: Normal rate, regular rhythm and normal heart sounds.  Neurological: She is alert and oriented to person, place, and time.  Psychiatric: She has a normal mood and affect. Her behavior is normal.  Nursing note and vitals reviewed.   MAU/Cr today.       Assessment & Plan:  RTC in 4 weeks.

## 2018-08-05 NOTE — Assessment & Plan Note (Addendum)
A1C today was 7.4 which is improved from 8.1, likely related to her decreased prednisone dosing.  She has self titrated down to 5mg  at this time.  Her BP was improved on recheck, she is on an ARB.    She had a little bit of worsening Cr at last check, and has not had an MAU/cr recently.  Will check MAU/Cr.   Will attempt to wean steroids to off which should help her blood sugar and blood pressure.   Plan Wean steroids Continue current glimepiride 1mg  She will have CGM placed today for possible hypoglycemia unawareness. Follow up as directed by CGM protocol.

## 2018-08-05 NOTE — Assessment & Plan Note (Signed)
She came off of steroids, but then had a rise in her inflammatory markers and return of symptoms.  She is now on 5mg  of prednisone after self weaning and has had a mild return of thigh pain.  We had a long discussion today about weaning her steroids more slowly.  Today, we will go back up to 8mg  in hopes that her thigh pain will resolve.  She will stay at this dose for about 4 weeks and then wean every 2-4 weeks by 2mg  until she is done with steroids.   If we are unable to wean her from steroids, may consider re-evaluation by rheumatology.   Plan Decrease prednisone to 8mg  daily

## 2018-08-05 NOTE — Patient Instructions (Addendum)
For your constipation   o Drink 64 fl oz or more of water and other low-calorie (10 calories or less per 8 fl oz) beverages o You can add 1-3 teaspoons per day of Metamucil or Benefiber, 1 teaspoon at a time, to any fluids you choose o Add chia seeds or freshly ground flax seeds to yogurt or oatmeal o Eat some prunes, can have a little prune juice o Drink some Smooth Move tea, available at most store on the tea aisle o Can try a magnesium supplement daily as it relaxes smooth muscles and aids in easier bowel movements. Most of [place] are not getting the recommended amount. I recommend an additional 250 mg of magnesium per day (if you are not getting a higher dose from a different supplement already) o Eat as much leafy green vegetables as possible (spinach, kale, collards, turnip and mustard greens); they provided a lot of fiber, and are a good source of magnesium as well. Add kale or spinach to smoothies with frozen berries (more fiber) if you do not like to eat greens. o Other magnesium-rich foods are: legumes (beans and lentils - also great sources of fiber), nuts like almonds and cashews, avocado, yogurt, and milk. o Over time try to increase intake of vegetables and fruits to 1 or more cups each per day. This will probably take 6+ months.  Breakfast: yogurt with active cultures with berries and chia seeds OR 1/2 cup Bran cereal with fruit and soy MILK or yogurt  Lunch: 1/2 sandwich using 100% whole bread, 1 cup salad with fruit or berries in salad or for dessert  Dinner: chicken or [place] or beans, 1/2 cup greens, 1/2 cup sweet potato with skin on, fruit for dessert like apple, prunes or berries, 8 oz soymilk with dinner or for a snack  8 oz water with 1-2 tablespoons of PSYLLIUM HUSK stirred in drink as quickly as you can before it gets thick!  Please record the time, amount and what food drinks and activities you have while wearing the continuous glucose monitor(CGM).  Bring the folder with  you to follow up appointments  Do not have a CT or an MRI while wearing the CGM.   Please make an appointment for 1 week with me and a doctor for the first of two CGM downloads..   You will also return in 2 weeks to have your second download and the CGM remove  Butch Penny [phone]

## 2018-08-05 NOTE — Assessment & Plan Note (Signed)
BP today was 147/58.  On recheck was 119/48.  She was not able to tolerate HCTZ and I have advised her to stop this medication.  She does have labile blood pressure and sensitivity to medications.  She will continue a 2 drug regimen.   Plan Continue amlodipine and valsartan

## 2018-08-05 NOTE — Patient Instructions (Signed)
Ms. Kocsis - -  Please start taking 8mg  of prednisone daily.  Please take 5mg  and three 1mg  tablets to get to this dose.    Please come back in 4 weeks for evaluation of your PMR.

## 2018-08-12 ENCOUNTER — Ambulatory Visit (INDEPENDENT_AMBULATORY_CARE_PROVIDER_SITE_OTHER): Payer: Medicare Other | Admitting: Dietician

## 2018-08-12 ENCOUNTER — Encounter: Payer: Self-pay | Admitting: Internal Medicine

## 2018-08-12 ENCOUNTER — Encounter: Payer: Self-pay | Admitting: Dietician

## 2018-08-12 ENCOUNTER — Ambulatory Visit (INDEPENDENT_AMBULATORY_CARE_PROVIDER_SITE_OTHER): Payer: Medicare Other | Admitting: Internal Medicine

## 2018-08-12 ENCOUNTER — Other Ambulatory Visit: Payer: Self-pay

## 2018-08-12 VITALS — BP 115/62 | HR 100 | Temp 98.0°F | Wt 127.7 lb

## 2018-08-12 DIAGNOSIS — E119 Type 2 diabetes mellitus without complications: Secondary | ICD-10-CM | POA: Diagnosis not present

## 2018-08-12 DIAGNOSIS — Z6821 Body mass index (BMI) 21.0-21.9, adult: Secondary | ICD-10-CM

## 2018-08-12 DIAGNOSIS — Z713 Dietary counseling and surveillance: Secondary | ICD-10-CM | POA: Diagnosis not present

## 2018-08-12 NOTE — Patient Instructions (Addendum)
Please return in 1 days with [place] for Download #2.   Please check your sugars if you notice symptoms of low sugars (weakness, shakiness, dizziness) and treat if below 80 with 6oz of juice. Recheck sugar in 15 minutes and repeat treatment if still low.

## 2018-08-12 NOTE — Patient Instructions (Signed)
Carbohydrates for Diabetes Mellitus, Adult   Eating carbohydrates naturally increases the amount of sugar (glucose) in the blood. Counting how many carbohydrates you eat helps keep your blood glucose within normal limits, which helps you manage your diabetes (diabetes mellitus).  It is important to know how many carbohydrates you can safely have in each meal. This is different for every person. A diet and nutrition specialist (registered dietitian) can help you make a meal plan and calculate how many carbohydrates you should have at each meal and snack.  Carbohydrates are found in the following foods:  Grains, such as breads and cereals.  Dried beans and soy products.  Starchy vegetables, such as potatoes, peas, and corn.  Fruit and fruit juices.  Milk and yogurt.  Sweets and snack foods, such as cake, cookies, candy, chips, and soft drinks.  If not already doing so, please try to eat a bedtime snack.  Christine [name] [phone]

## 2018-08-12 NOTE — Assessment & Plan Note (Signed)
Christine [name] wore the CGM for 8 days. The average reading was 133, % time in target was 83, % time below target was 2, and % time above target was. 15. Intervention will be to continue glimiperide 1mg  daily. The patient will be scheduled to see [place] and physician for a final appointment.   We discussed possibility of changing her regimen in the future if she has frequent enough episodes of hypoglycemia. Did not tolerate metformin.  Discussed using protein supplement shakes for days she has decreased appetite (cannot tolerate glucerna due to diarrhea, so will try ensure).

## 2018-08-12 NOTE — Progress Notes (Signed)
   CC: DM  HPI:  Christine [name] is a 84 y.o. with a PMH of T2DM, HTN, hypothyroidism, temporal arteritis, CAD presenting to clinic for follow up on diabetes.  DM: Patient is on glimepiride 1mg  daily; she states on initial diagnosis she was placed on metformin however experienced significant stomach upset and was not able to tolerate it. She reports one episode of hypoglycemia to mid-70s about 2 wks ago after not eating much throughout the day; this corrected after drinking juice. Symptoms were weakness and shakiness. She denies further episodes. She still has intermittently decreased PO intake. She did not have symptoms at periods of time noted on CGM.  Please see problem based Assessment and Plan for status of patients chronic conditions.  Past Medical History:  Diagnosis Date  . Anemia   . Anginal pain ([place])   . Arthritis    "back, arms, hips; hands" (11/30/2015)  . 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 Blood transfusion 1972   "after daughter born, attempted to give me blood; couldn't give it cause my blood was cold" (09/23/2013)  . CAD (coronary artery disease)    a. LHC 08/23/11: dLM 40-50%, oRI 40%, oCFX 40%, oD1 70% (small and not amenable to PCI).  LM lesion did not appear to be flow limiting.  Medical rx was recommended;  b. Echo 08/23/11: mild LVH, EF 60-65%, grade 1 diast dysfxn, mild BAE, PASP 24;  c. 02/2012  Cath: LM 50d, LAD 50p, D1 70ost, RI 70p, RCA ok->Med Rx;  d. 01/2013 Cardiolite: EF 88, no ischemia/infarct.  . Carotid stenosis    a. dopplers 10/12:  0-39% bilat ICA;  b. 10/2012 U/S: 0-39% bilat, f/u 1 yr (10/2013).  . Chronic bronchitis   . Depression   . Diverticulosis of colon (without mention of hemorrhage)   . GERD (gastroesophageal reflux disease)   . Heart murmur, systolic    2-D echo in December 2011 showed a normal EF with grade 1 diastolic dysfunction, trivial pulmonary regurgitation and  mildly elevated PA pressure at 37 mmHg probably secondary to her COPD.dynamic obstruction-mid cavity obliteration;  b. 02/2012 Echo: EF 60-65%, mild LVH, PASP 44mmHg.  Marland Kitchen History of stomach ulcers 1970's  . Hyperlipidemia   . Hypertension   . Hypothyroidism   . Migraines    a. Next atypical symptoms in the past. Patient was started on Neurontin for possible neuropathic origin of her pain.  . Polymyalgia rheumatica ([place])   . Shortness of breath on exertion    "just related to angina >1 yr ago" (09/23/2013)  . Sinus arrhythmia   . Type II diabetes mellitus ([place])    a. On oral hypoglycemic agents.    Review of Systems:   Per HPI  Physical Exam:  Vitals:   08/12/18 1052  BP: 115/62  Pulse: 100  Temp: 98 F (36.7 C)  TempSrc: Oral  SpO2: 100%  Weight: 127 lb 11.2 oz (57.9 kg)   GENERAL- alert, co-operative, appears as stated age, not in any distress. RESP- No increased work of breathing. SKIN- Warm, dry. PSYCH- flat affect  Assessment & Plan:   See Encounters Tab for problem based charting.   Patient discussed with Dr. Angelia Mould   Alphonzo Grieve, MD Internal Medicine PGY-3

## 2018-08-12 NOTE — Progress Notes (Signed)
  Medical Nutrition Therapy:  Appt start time: 1001 end time:  1045. Visit # 1  Assessment:  Primary concerns today: glycemic control Christine [name] is here for week 1 Continuous glucose monitoring.  She reports not feeling well and has pain most afternoons due to rectal prolapse just recently got her appetite back. She has troubles with a rectal prolapse and pain. Her weight appears to be rebounding from an acute 4# (3%) weight loss and BMI is low for her age. She has a history of severe protein calorie malnutrition in 2018 after an acute illness. Her food intake today appeared adequate.   Preferred Learning Style: No preference indicated  Learning Readiness: Ready  ANTHROPOMETRICS: Estimated body mass index is 21.92 kg/m as calculated from the following:   Height as of 08/05/18: 5\' 4"  (1.626 m).   Weight as of an earlier encounter on 08/12/18: 127 lb 11.2 oz (57.9 kg).  WEIGHT HISTORY:  Wt Readings from Last 5 Encounters:  08/12/18 127 lb 11.2 oz (57.9 kg)  08/05/18 126 lb 4.8 oz (57.3 kg)  07/30/18 126 lb 12.8 oz (57.5 kg)  07/28/18 128 lb (58.1 kg)  06/24/18 130 lb 3.2 oz (59.1 kg)   SLEEP:need to assess at future visit  MEDICATIONS: 1 mg amaryl daily, prednisone daily   BLOOD SUGAR: she wore the CGM for 8 days. The average reading was 133, % time in target was 86, % time below target was 2, and % time above target was. 15. The pattern showed higher blood sugar throughout most days and lower at night to around 70. Intervention will be to try to replace higher carb foods with lower carb foods.  DIETARY INTAKE: Usual eating pattern includes 3 meals and 1-2 snacks per day. Everyday foods include chicken greens, limas, cornbread, oatmeal, high fiber foods.     She drinks water  Usual physical activity: adls  Progress Towards Goal(s):  In progress.   Nutritional Diagnosis:  NI-5.8.4 Inconsistent carbohydrate intake As related to some days her blood sugars increased and others it did not.   As evidenced by her food records and cgm results.    Intervention:  Nutrition education about Continuous glucose monitoring results and how her food intake affected her blood sugars. Encouraged her to try to find lower carbs foods with healthy fats to replace some carb foods. Follow up with her to encourage bedtime snacks to prevent low blood sugar  overnight.  Coordination of care: discussed cgm results with Dr. Jari Favre.   Teaching Method Utilized: Visual,Auditory,Hands on Handouts given during visit include:avs, carb content of foods for review.  Barriers to learning/adherence to lifestyle change: competing values Demonstrated degree of understanding via:  Teach Back   Monitoring/Evaluation:  Dietary intake, exercise, glucometer, and body weight in 1 week(s)  Christine [name], RD 08/12/2018 4:36 PM. .

## 2018-08-13 ENCOUNTER — Telehealth: Payer: Self-pay | Admitting: Gastroenterology

## 2018-08-13 ENCOUNTER — Other Ambulatory Visit: Payer: Self-pay

## 2018-08-13 DIAGNOSIS — K622 Anal prolapse: Secondary | ICD-10-CM

## 2018-08-13 NOTE — Telephone Encounter (Signed)
Pt needs to speak with you regarding a referral that she needs.

## 2018-08-13 NOTE — Telephone Encounter (Signed)
Spoke to patient and she was unable to keep her appointments for the pelvic floor PT, referral then lapsed. She is requesting a new referral. I have sent in a new referral, they let patient know that they will contact her once that has been done.

## 2018-08-17 NOTE — Progress Notes (Signed)
Internal Medicine Clinic Attending  Case discussed with Dr. Svalina  at the time of the visit.  We reviewed the resident's history and exam and pertinent patient test results.  I agree with the assessment, diagnosis, and plan of care documented in the resident's note.  

## 2018-08-19 ENCOUNTER — Ambulatory Visit (INDEPENDENT_AMBULATORY_CARE_PROVIDER_SITE_OTHER): Payer: Medicare Other | Admitting: Internal Medicine

## 2018-08-19 DIAGNOSIS — E119 Type 2 diabetes mellitus without complications: Secondary | ICD-10-CM

## 2018-08-19 DIAGNOSIS — Z7984 Long term (current) use of oral hypoglycemic drugs: Secondary | ICD-10-CM | POA: Diagnosis not present

## 2018-08-19 DIAGNOSIS — I10 Essential (primary) hypertension: Secondary | ICD-10-CM | POA: Diagnosis not present

## 2018-08-19 DIAGNOSIS — Z7902 Long term (current) use of antithrombotics/antiplatelets: Secondary | ICD-10-CM

## 2018-08-19 NOTE — Progress Notes (Signed)
CC: CGM monitoring and treatment evaluation  HPI:[name] is a 84 y.o. female who presents today for evaluation of her CGM data and consideration of medication changes.   Diabetes: The patients current data appears near ideal. I am uncertain that we would be able to improve on this much. She has opted to defer treatment changes until she can meet with her PCP Dr. Daryll Drown at their next visit. She was advised that the recommendation from our clinical pharmacist, Dr. Maudie Mercury, was to discontinue the glimepiride and initiate either Januvia, Trulicity, or Jardiance. Although I feel that these recommendation are in line with recent data supporting their use preferentially, I agree with her decision to postpone a medication change until she can speak with her PCP as her currently recorded data is acceptable given the limitations surrounding diet.  The patient denied visual changes, confusion, sweating, weakness, rapid heart rate or fatigue.   Christine [name] wore the CGM for 7 days. The average reading was 138, % time in target was 80, % time below target was <1, and % time above target was. 19. Intervention will be to discuss treatment options with her PCP as she would like to defer this until then. The patient will be scheduled to see her PCP later this month.  HTN: Appears acceptable under 140/80 at 138/71. No medication changes recommended.    Past Medical History:  Diagnosis Date  . Anemia   . Anginal pain ([place])   . Arthritis    "back, arms, hips; hands" (11/30/2015)  . Benign hypertensive heart and kidney disease with diastolic CHF, NYHA class II and CKD stage III ([place])   . Blood transfusion 1972   "after daughter born, attempted to give me blood; couldn't give it cause my blood was cold" (09/23/2013)  . CAD (coronary artery disease)    a. LHC 08/23/11: dLM 40-50%, oRI 40%, oCFX 40%, oD1 70% (small and not amenable to PCI).  LM lesion did not appear to be flow limiting.  Medical rx was  recommended;  b. Echo 08/23/11: mild LVH, EF 60-65%, grade 1 diast dysfxn, mild BAE, PASP 24;  c. 02/2012  Cath: LM 50d, LAD 50p, D1 70ost, RI 70p, RCA ok->Med Rx;  d. 01/2013 Cardiolite: EF 88, no ischemia/infarct.  . Carotid stenosis    a. dopplers 10/12:  0-39% bilat ICA;  b. 10/2012 U/S: 0-39% bilat, f/u 1 yr (10/2013).  . Chronic bronchitis   . Depression   . Diverticulosis of colon (without mention of hemorrhage)   . GERD (gastroesophageal reflux disease)   . Heart murmur, systolic    2-D echo in December 2011 showed a normal EF with grade 1 diastolic dysfunction, trivial pulmonary regurgitation and mildly elevated PA pressure at 37 mmHg probably secondary to her COPD.dynamic obstruction-mid cavity obliteration;  b. 02/2012 Echo: EF 60-65%, mild LVH, PASP 25mmHg.  Marland Kitchen History of stomach ulcers 1970's  . Hyperlipidemia   . Hypertension   . Hypothyroidism   . Migraines    a. Next atypical symptoms in the past. Patient was started on Neurontin for possible neuropathic origin of her pain.  . Polymyalgia rheumatica ([place])   . Shortness of breath on exertion    "just related to angina >1 yr ago" (09/23/2013)  . Sinus arrhythmia   . Type II diabetes mellitus ([place])    a. On oral hypoglycemic agents.   Review of Systems:  ROS negative except as per HPI.  Physical Exam:  Vitals:   08/19/18 4098  BP: 138/71  Pulse: 86  Temp: 98.1 F (36.7 C)  TempSrc: Oral  SpO2: 100%  Weight: 126 lb 14.4 oz (57.6 kg)   General: A/Ox4, in no acute distress, afebrile, nondiaphoretic, appears comfortable at rest Cardio: RRR, no MRG's Pulm: CTA bilaterally  Assessment & Plan:   See Encounters Tab for problem based charting.  Patient discussed with Dr. Eppie Gibson

## 2018-08-19 NOTE — Patient Instructions (Addendum)
FOLLOW-UP INSTRUCTIONS When: With Dr. Daryll Drown on October 23rd.  For: Routine visit What to bring: All of your medications  Please be sure to bring your glucose meter with you to your appointment.  If you have any questions or concerns please feel free to let us know at any time.

## 2018-08-20 DIAGNOSIS — E119 Type 2 diabetes mellitus without complications: Secondary | ICD-10-CM | POA: Diagnosis not present

## 2018-08-20 DIAGNOSIS — M316 Other giant cell arteritis: Secondary | ICD-10-CM | POA: Diagnosis not present

## 2018-08-20 DIAGNOSIS — Z7984 Long term (current) use of oral hypoglycemic drugs: Secondary | ICD-10-CM | POA: Diagnosis not present

## 2018-08-20 LAB — HM DIABETES EYE EXAM

## 2018-08-20 NOTE — Assessment & Plan Note (Signed)
Diabetes: The patients current data appears near ideal. I am uncertain that we would be able to improve on this much. She has opted to defer treatment changes until she can meet with her PCP Dr. Daryll Drown at their next visit. She was advised that the recommendation from our clinical pharmacist, Dr. Maudie Mercury, was to discontinue the glimepiride and initiate either Januvia, Trulicity, or Jardiance. Although I feel that these recommendation are in line with recent data supporting their use preferentially, I agree with her decision to postpone a medication change until she can speak with her PCP as her currently recorded data is acceptable given the limitations surrounding diet.  The patient denied visual changes, confusion, sweating, weakness, rapid heart rate or fatigue.   [name] wore the CGM for 7 days. The average reading was 138, % time in target was 80, % time below target was <1, and % time above target was. 19. Intervention will be to discuss treatment options with her PCP as she would like to defer this until then. The patient will be scheduled to see her PCP later this month.

## 2018-08-20 NOTE — Progress Notes (Signed)
Case discussed with Dr. Berline Lopes at the time of the visit.  Together we reviewed the resident's history and continuous glucose monitoring data and formulated a plan.  I agree with the assessment, diagnosis and plan of care documented in the resident's note.

## 2018-08-20 NOTE — Assessment & Plan Note (Signed)
HTN: Appears acceptable under 140/80 at 138/71. No medication changes recommended.

## 2018-08-28 ENCOUNTER — Other Ambulatory Visit: Payer: Self-pay | Admitting: Pharmacist

## 2018-08-28 ENCOUNTER — Ambulatory Visit: Payer: Medicare Other | Admitting: Cardiovascular Disease

## 2018-08-28 DIAGNOSIS — E119 Type 2 diabetes mellitus without complications: Secondary | ICD-10-CM

## 2018-08-28 MED ORDER — SITAGLIPTIN PHOSPHATE 50 MG PO TABS: 50.0000 mg | ORAL_TABLET | Freq: Every day | ORAL | 0 refills | Status: DC

## 2018-08-28 NOTE — Progress Notes (Addendum)
Working with Dr. Daryll Drown to look into switching glimepiride to sitagliptin for patient. Patient prefers to wait until PCP appointment to make the change, education provided on sitagliptin. I also coordinated with pharmacy to ensure sitagliptin coverage, and pharmacist quoted $3 copay. Pharmacy was asked to place sitagliptin on hold for now.

## 2018-08-31 ENCOUNTER — Encounter: Payer: Self-pay | Admitting: *Deleted

## 2018-09-03 ENCOUNTER — Ambulatory Visit: Payer: Medicare Other | Attending: Gastroenterology | Admitting: Physical Therapy

## 2018-09-03 ENCOUNTER — Encounter: Payer: Self-pay | Admitting: Physical Therapy

## 2018-09-03 ENCOUNTER — Other Ambulatory Visit: Payer: Self-pay

## 2018-09-03 DIAGNOSIS — K623 Rectal prolapse: Secondary | ICD-10-CM | POA: Insufficient documentation

## 2018-09-03 DIAGNOSIS — M6281 Muscle weakness (generalized): Secondary | ICD-10-CM | POA: Diagnosis not present

## 2018-09-03 DIAGNOSIS — R279 Unspecified lack of coordination: Secondary | ICD-10-CM | POA: Diagnosis not present

## 2018-09-03 NOTE — Therapy (Signed)
[facility] [address] [place], [place], [address] Phone: [phone]   Fax:  [phone]  Physical Therapy Evaluation  Patient Details  Name: Christine [name] MRN: [id number] Date of Birth: 01/24/1934 Referring Provider (PT): Dr. Stormy Card   Encounter Date: 09/03/2018  PT End of Session - 09/03/18 0919    Visit Number  1    Date for PT Re-Evaluation  10/29/18    Authorization Type  Medicare/Medicaid    PT Start Time  0845    PT Stop Time  0920    PT Time Calculation (min)  35 min    Activity Tolerance  Patient tolerated treatment well    Behavior During Therapy  [facility] for tasks assessed/performed       Past Medical History:  Diagnosis Date  . Anemia   . Anginal pain ([place])   . Arthritis    "back, arms, hips; hands" (11/30/2015)  . Benign hypertensive heart and kidney disease with diastolic CHF, NYHA class II and CKD stage III ([place])   . Blood transfusion 1972   "after daughter born, attempted to give me blood; couldn't give it cause my blood was cold" (09/23/2013)  . CAD (coronary artery disease)    a. LHC 08/23/11: dLM 40-50%, oRI 40%, oCFX 40%, oD1 70% (small and not amenable to PCI).  LM lesion did not appear to be flow limiting.  Medical rx was recommended;  b. Echo 08/23/11: mild LVH, EF 60-65%, grade 1 diast dysfxn, mild BAE, PASP 24;  c. 02/2012  Cath: LM 50d, LAD 50p, D1 70ost, RI 70p, RCA ok->Med Rx;  d. 01/2013 Cardiolite: EF 88, no ischemia/infarct.  . Carotid stenosis    a. dopplers 10/12:  0-39% bilat ICA;  b. 10/2012 U/S: 0-39% bilat, f/u 1 yr (10/2013).  . Chronic bronchitis   . Depression   . Diverticulosis of colon (without mention of hemorrhage)   . GERD (gastroesophageal reflux disease)   . Heart murmur, systolic    2-D echo in December 2011 showed a normal EF with grade 1 diastolic dysfunction, trivial pulmonary regurgitation and mildly elevated PA pressure at 37 mmHg probably secondary to her  COPD.dynamic obstruction-mid cavity obliteration;  b. 02/2012 Echo: EF 60-65%, mild LVH, PASP 3mmHg.  Marland Kitchen History of stomach ulcers 1970's  . Hyperlipidemia   . Hypertension   . Hypothyroidism   . Migraines    a. Next atypical symptoms in the past. Patient was started on Neurontin for possible neuropathic origin of her pain.  . Polymyalgia rheumatica ([place])   . Shortness of breath on exertion    "just related to angina >1 yr ago" (09/23/2013)  . Sinus arrhythmia   . Type II diabetes mellitus ([place])    a. On oral hypoglycemic agents.    Past Surgical History:  Procedure Laterality Date  . CARDIAC CATHETERIZATION N/A 09/26/2015   Procedure: Right/Left Heart Cath and Coronary Angiography;  Surgeon: Larey Dresser, MD;  Location: [place] [facility];  Service: Cardiovascular;  Laterality: N/A;  . CATARACT EXTRACTION W/ INTRAOCULAR LENS  IMPLANT, BILATERAL Bilateral 1990's  . LEFT HEART CATHETERIZATION WITH CORONARY ANGIOGRAM N/A 03/16/2012   Procedure: LEFT HEART CATHETERIZATION WITH CORONARY ANGIOGRAM;  Surgeon: Hillary Bow, MD;  Location: [facility];  Service: Cardiovascular;  Laterality: N/A;  . RIGHT/LEFT HEART CATH AND CORONARY ANGIOGRAPHY N/A 08/12/2017   Procedure: RIGHT/LEFT HEART CATH AND CORONARY ANGIOGRAPHY;  Surgeon: Martinique, Peter M, MD;  Location: [place] [facility];  Service: Cardiovascular;  Laterality: N/A;  . VAGINAL HYSTERECTOMY  1976    There were no vitals filed for this visit.   Subjective Assessment - 09/03/18 0848    Subjective  Patient reports her prolapse started 05/2018.  Patient reports her prolapse still comes out but is not as large.  Patient reports with walking and being on her feet it comes out. Patient is having a discharge from the rectum.  Patient is taking a stool softner and not having to strain to have a bowel movement.     Patient Stated Goals  reduce the prolapse    Currently in Pain?  Yes    Pain Score  7     Pain Location  Rectum    Pain  Orientation  Mid    Pain Descriptors / Indicators  Aching;Pressure    Pain Type  Chronic pain    Pain Radiating Towards  radiates into the thighs    Pain Onset  More than a month ago    Pain Frequency  Intermittent    Aggravating Factors   when on her feet prolapse comes out    Pain Relieving Factors  sitting, lying down    Multiple Pain Sites  No         [facility] PT Assessment - 09/03/18 0001      Assessment   Medical Diagnosis  K62.2 Anal prolpase    Referring Provider (PT)  Dr. Remo Lipps Armruster    Onset Date/Surgical Date  05/18/18    Prior Therapy  2 sessions      Precautions   Precautions  None      Restrictions   Weight Bearing Restrictions  No      Balance Screen   Has the patient fallen in the past 6 months  No    Has the patient had a decrease in activity level because of a fear of falling?   No    Is the patient reluctant to leave their home because of a fear of falling?   No      Home Environment   Living Environment  Private residence    Living Arrangements  Children    Available Help at Discharge  Family;Available PRN/intermittently    Type of Home  House    Home Access  Stairs to enter    Entrance Stairs-Number of Steps  2    Entrance Stairs-Rails  None    Home Layout  Two level;Bed/bath upstairs      Prior Function   Level of Independence  Independent    Vocation  Retired      Associate Professor   Overall Cognitive Status  Within Functional Limits for tasks assessed      Posture/Postural Control   Posture/Postural Control  No significant limitations      ROM / Strength   AROM / PROM / Strength  AROM;PROM;Strength      AROM   Overall AROM   Within functional limits for tasks performed      PROM   Right Hip External Rotation   40    Left Hip External Rotation   40      Strength   Right Hip Extension  3/5    Right Hip External Rotation   4/5    Right Hip ABduction  3+/5    Left Hip Extension  3/5    Left Hip External Rotation  4/5    Left Hip  ABduction  3+/5      Standardized Balance  Assessment   Five times sit to stand comments   17 sec                Objective measurements completed on examination: See above findings.    Pelvic Floor Special Questions - 09/03/18 0001    Urinary Leakage  No    Fecal incontinence  Yes   sometimes feels and not; uses papertowel   Fluid intake  diet coke, ginger ale, water, coffee, orange juice    Falling out feeling (prolapse)  Yes    Activities that cause feeling of prolapse  walk, standing    External Palpation  decreased tone of the anus; anus is opened     Prolapse  Other    Prolapse other  rectal    Pelvic Floor Internal Exam  Patient comfirms identification and approves PT to assess pelvic floor and treatment    Exam Type  Rectal    Palpation  weakness on the posterior aspect of anus    Strength  weak squeeze, no lift    Strength # of seconds  5    Tone  10               PT Education - 09/03/18 0919    Education Details  pelvic floor contraction in sidely; how to manage her prolpase    Person(s) Educated  Patient    Methods  Explanation;Demonstration;Handout    Comprehension  Returned demonstration;Verbalized understanding       PT Short Term Goals - 09/03/18 0927      PT SHORT TERM GOAL #1   Title  independent with initial HEP with pelvic floor contraction    Time  4    Period  Weeks    Status  New    Target Date  10/01/18      PT SHORT TERM GOAL #2   Title  abitliy to contract the pelvic floor and lower abdominal to improve continence    Time  4    Period  Weeks    Status  New    Target Date  10/01/18      PT SHORT TERM GOAL #3   Title  understand correct toilet technique to fully evacuate his bowels    Time  4    Period  Weeks    Status  New    Target Date  10/01/18      PT SHORT TERM GOAL #4   Title  understand bowel health    Time  4    Period  Weeks    Status  New    Target Date  10/01/18        PT Long Term Goals - 09/03/18  0927      PT LONG TERM GOAL #1   Title  independent with HEP and understand how to progress herself    Time  8    Period  Weeks    Status  New    Target Date  10/29/18      PT LONG TERM GOAL #2   Title  improved fecal leakage >/= 50% due to improved strength    Time  8    Period  Weeks    Status  New    Target Date  10/29/18      PT LONG TERM GOAL #3   Title  rectal prolpase reduce 50% of the time due to imrpoved pelvic floor strength >/= 3/5 holding or 10 seconds    Time  8  Period  Weeks    Status  New    Target Date  10/29/18      PT LONG TERM GOAL #4   Title  understand ways to manage rectal prolpase     Time  8    Period  Weeks    Status  New    Target Date  10/29/18             Plan - 09/03/18 0920    Clinical Impression Statement  Patient is a 84 year old female with rectal prolpase and fecal leakage for several months.  Patient wears a pad daily and will use paper towel tucked into the anus.  Patient has pain at level 7/10 with standing and walking due to rectal prolapse. Sit to stand is 17 seconds which is an improvement since her therapy visits in the past.  Patient reports mucous leakage in the rectal area. Pelvic floor strength is 2/5 with posterior aspect of the rectum with less tone than the anterior.  Patient anus is gapping. Bilateral hip strength averages 3-4/5. Bilateral hip P/ROM is 40 degrees.  Patient will benefit from skilled therapy to improve pelvic floor strength and coordination to reduce fecal leakage.     History and Personal Factors relevant to plan of care:  Plymyalgia rhumatica; osteopenia; dierticulosis; hypothyroidism, diabetes; vaginal hysterectomy; cardiac catherization    Clinical Presentation  Evolving    Clinical Presentation due to:  soil her underwear throughout the day, difficulty with walking and standing due to pain from prolapse    Clinical Decision Making  Moderate    Rehab Potential  Excellent    Clinical Impairments  Affecting Rehab Potential  Plymyalgia rhumatica; osteopenia; dierticulosis; hypothyroidism, diabetes; vaginal hysterectomy; cardiac catherization    PT Frequency  1x / week    PT Duration  8 weeks    PT Treatment/Interventions  Biofeedback;Therapeutic exercise;Therapeutic activities;Neuromuscular re-education;Patient/family education;Manual techniques;Passive range of motion;Energy conservation    PT Next Visit Plan  toileting technique; hip stretches; pelvic floor contraction; bowel health    Consulted and Agree with Plan of Care  Patient       Patient will benefit from skilled therapeutic intervention in order to improve the following deficits and impairments:  Increased fascial restricitons, Pain, Decreased coordination, Increased muscle spasms, Decreased activity tolerance, Decreased endurance, Decreased range of motion, Decreased strength, Impaired flexibility  Visit Diagnosis: Muscle weakness (generalized) - Plan: PT plan of care cert/re-cert  Unspecified lack of coordination - Plan: PT plan of care cert/re-cert  Rectal prolapse - Plan: PT plan of care cert/re-cert     Problem List Patient Active Problem List   Diagnosis Date Noted  . Acute right eye pain 07/28/2018  . Rectal prolapse 06/24/2018  . Temporal arteritis ([place]) 04/06/2018  . Chronic cough 10/30/2017  . Right knee pain 08/20/2017  . MDD (major depressive disorder), recurrent, severe, with psychosis ([place]) 11/30/2016  . Protein-calorie malnutrition, severe 11/30/2016  . Elevated alkaline phosphatase level 09/18/2016  . Mixed incontinence urge and stress 05/09/2016  . Hypertensive retinopathy of both eyes, grade 2 04/20/2015  . Lumbar spondylosis 06/16/2014  . Carpal tunnel syndrome 02/18/2014  . Right shoulder pain 02/02/2014  . Osteopenia 01/11/2013  . Routine adult health maintenance 01/11/2013  . Diverticulosis 09/18/2012  . Dyspnea on exertion 03/24/2012  . Fatigue 02/26/2012  . Allergic rhinitis  02/13/2012  . Coronary artery disease 08/29/2011  . HEART MURMUR, SYSTOLIC 09/03/5101  . Enlargement of clavicle 03/30/2008  . Solitary pulmonary nodule 12/15/2006  .  Hyperlipidemia 11/28/2006  . Hypothyroidism 09/04/2006  . Type 2 diabetes mellitus, controlled ([place]) 09/04/2006  . Migraine 09/04/2006  . Essential hypertension 09/04/2006  . GERD 09/04/2006    Earlie Counts, PT 09/03/18 9:30 AM   [facility] [address] [place], [place], [address] Phone: [phone]   Fax:  [phone]  Name: Christine [name] MRN: [id number] Date of Birth: 01/24/1934

## 2018-09-03 NOTE — Patient Instructions (Addendum)
Slow Contraction: Gravity Eliminated (Side-Lying)    Lie on left side, hips and knees slightly bent. Slowly squeeze pelvic floor for _5__ seconds. Rest for _5_ seconds. Repeat _10__ times. Do __4_ times a day.   Copyright  VHI. All rights reserved.   No standing more than 5 minutes or walking more 10 minutes.   Must sit down to rest  If you feel the prolapse and pain in thighs lay down but if not able to lay down sit [facility] [address] [place], [place] [address] Phone # [phone] Fax [phone]

## 2018-09-09 ENCOUNTER — Ambulatory Visit (INDEPENDENT_AMBULATORY_CARE_PROVIDER_SITE_OTHER): Payer: Medicare Other | Admitting: Internal Medicine

## 2018-09-09 ENCOUNTER — Other Ambulatory Visit: Payer: Self-pay

## 2018-09-09 ENCOUNTER — Encounter: Payer: Self-pay | Admitting: Internal Medicine

## 2018-09-09 DIAGNOSIS — F333 Major depressive disorder, recurrent, severe with psychotic symptoms: Secondary | ICD-10-CM

## 2018-09-09 DIAGNOSIS — Z7952 Long term (current) use of systemic steroids: Secondary | ICD-10-CM

## 2018-09-09 DIAGNOSIS — Z7984 Long term (current) use of oral hypoglycemic drugs: Secondary | ICD-10-CM

## 2018-09-09 DIAGNOSIS — E119 Type 2 diabetes mellitus without complications: Secondary | ICD-10-CM

## 2018-09-09 DIAGNOSIS — M316 Other giant cell arteritis: Secondary | ICD-10-CM

## 2018-09-09 DIAGNOSIS — I10 Essential (primary) hypertension: Secondary | ICD-10-CM

## 2018-09-09 DIAGNOSIS — M353 Polymyalgia rheumatica: Secondary | ICD-10-CM

## 2018-09-09 DIAGNOSIS — F3342 Major depressive disorder, recurrent, in full remission: Secondary | ICD-10-CM

## 2018-09-09 DIAGNOSIS — Z7902 Long term (current) use of antithrombotics/antiplatelets: Secondary | ICD-10-CM

## 2018-09-09 DIAGNOSIS — Z79899 Other long term (current) drug therapy: Secondary | ICD-10-CM

## 2018-09-09 LAB — GLUCOSE, CAPILLARY: GLUCOSE-CAPILLARY: 129 mg/dL — AB (ref 70–99)

## 2018-09-09 MED ORDER — IMIPRAMINE HCL 50 MG PO TABS: 50.0000 mg | ORAL_TABLET | Freq: Two times a day (BID) | ORAL | 1 refills | Status: DC

## 2018-09-09 MED ORDER — AMLODIPINE BESYLATE 10 MG PO TABS: 10.0000 mg | ORAL_TABLET | Freq: Every day | ORAL | 3 refills | Status: DC

## 2018-09-09 MED ORDER — VALSARTAN 160 MG PO TABS: 240.0000 mg | ORAL_TABLET | Freq: Every day | ORAL | 11 refills | Status: DC

## 2018-09-09 MED ORDER — PREDNISONE 5 MG PO TABS: 5.0000 mg | ORAL_TABLET | Freq: Every day | ORAL | 0 refills | Status: DC

## 2018-09-09 MED ORDER — PREDNISONE 1 MG PO TABS: 1.0000 mg | ORAL_TABLET | Freq: Every day | ORAL | 0 refills | Status: DC

## 2018-09-09 MED ORDER — SITAGLIPTIN PHOSPHATE 50 MG PO TABS: 50.0000 mg | ORAL_TABLET | Freq: Every day | ORAL | 6 refills | Status: DC

## 2018-09-09 NOTE — Progress Notes (Signed)
   Subjective:    [name], female    DOB: 01/24/1934, 84 y.o.   MRN: [id number]  CC: 4 week follow up for PMR and DM  HPI   Ms. Sirianni is an 84yo woman with PMR, DM2, HTN who presents for 4 week follow up. We are doing a slow taper of her prednisone due to return of symptoms initially on a more aggressive taper.  She has been taking 7.5mg  of prednisone as she ran out of 1mg  tablets.  She has no pain or aching, no myalgias.   We discussed changing from glimepiride to [place] to reduce adverse effect possibilities and she is amenable.  I believe her blood sugars will also improve with continued wean of steroids.   Home BP log shows high systolic in the 726O - 035D.  She has had issues with different blood pressure medications causing dizziness.  We discussed her medications and she does seem to be taking them correctly.  We will try a higher dose of valsartan to get better control, monitoring closely for low diastolic.  Review of Systems  Constitutional: Negative for activity change and unexpected weight change.  Respiratory: Negative for cough and shortness of breath.   Cardiovascular: Negative for chest pain and leg swelling.  Genitourinary: Negative for difficulty urinating and dysuria.  Musculoskeletal: Negative for arthralgias and myalgias.  Neurological: Negative for dizziness, weakness, light-headedness and headaches.  Psychiatric/Behavioral: Negative for confusion, decreased concentration and dysphoric mood.       Objective:   Physical Exam  Constitutional: She is oriented to person, place, and time. She appears well-developed and well-nourished.  Elderly woman, thin  HENT:  Head: Normocephalic and atraumatic.  Soft voice, normal for her  Eyes: Conjunctivae are normal. No scleral icterus.  Cardiovascular: Normal rate, regular rhythm and intact distal pulses.  No murmur heard. Pulmonary/Chest: Effort normal and breath sounds normal. No stridor. No respiratory  distress.  Musculoskeletal: She exhibits no edema or tenderness.  Neurological: She is alert and oriented to person, place, and time.  Skin: Skin is warm and dry.  Psychiatric: She has a normal mood and affect. Her behavior is normal.  Vitals reviewed.   BMET and MAU/Cr today.      Assessment & Plan:  RTC in 4 weeks.

## 2018-09-09 NOTE — Patient Instructions (Addendum)
Christine [name] - -  For your PMR (pain) please DECREASE your prednisone to 6mg  (one 1mg  tablet and one 5mg  tablet).   For your blood pressure, please INCREASE your diovan to 240mg  (1.5 tablets of your current prescription)  Please STOP glimepiride  Please START Januvia.   Come back to see me in 4 weeks.

## 2018-09-10 ENCOUNTER — Encounter: Payer: Self-pay | Admitting: Physical Therapy

## 2018-09-10 ENCOUNTER — Ambulatory Visit: Payer: Medicare Other | Admitting: Physical Therapy

## 2018-09-10 DIAGNOSIS — M6281 Muscle weakness (generalized): Secondary | ICD-10-CM | POA: Diagnosis not present

## 2018-09-10 DIAGNOSIS — K623 Rectal prolapse: Secondary | ICD-10-CM | POA: Diagnosis not present

## 2018-09-10 DIAGNOSIS — R279 Unspecified lack of coordination: Secondary | ICD-10-CM

## 2018-09-10 LAB — BMP8+ANION GAP
ANION GAP: 17 mmol/L (ref 10.0–18.0)
BUN/Creatinine Ratio: 17 (ref 12–28)
BUN: 18 mg/dL (ref 8–27)
CALCIUM: 9.8 mg/dL (ref 8.7–10.3)
CHLORIDE: 100 mmol/L (ref 96–106)
CO2: 22 mmol/L (ref 20–29)
Creatinine, Ser: 1.08 mg/dL — ABNORMAL HIGH (ref 0.57–1.00)
GFR calc Af Amer: 55 mL/min/{1.73_m2} — ABNORMAL LOW (ref >59)
GFR, EST NON AFRICAN AMERICAN: 48 mL/min/{1.73_m2} — AB (ref >59)
Glucose: 122 mg/dL — ABNORMAL HIGH (ref 65–99)
POTASSIUM: 3.7 mmol/L (ref 3.5–5.2)
Sodium: 139 mmol/L (ref 134–144)

## 2018-09-10 LAB — MICROALBUMIN / CREATININE URINE RATIO: CREATININE, UR: 28.2 mg/dL

## 2018-09-10 NOTE — Assessment & Plan Note (Signed)
She is doing very well today.  She is only on her imipramine which she takes for migraine prophylaxis and has been on for years.

## 2018-09-10 NOTE — Therapy (Signed)
[facility] [address] [place], [place], [address] Phone: [phone]   Fax:  [phone]  Physical Therapy Treatment  Patient Details  Name: Christine [name] MRN: [id number] Date of Birth: 01/24/1934 Referring Provider (PT): Dr. Stormy Card   Encounter Date: 09/10/2018  PT End of Session - 09/10/18 0841    Visit Number  3    Date for PT Re-Evaluation  10/29/18    Authorization Type  Medicare/Medicaid    PT Start Time  0800    PT Stop Time  0840    PT Time Calculation (min)  40 min    Activity Tolerance  Patient tolerated treatment well    Behavior During Therapy  [facility] for tasks assessed/performed       Past Medical History:  Diagnosis Date  . Anemia   . Anginal pain ([place])   . Arthritis    "back, arms, hips; hands" (11/30/2015)  . Benign hypertensive heart and kidney disease with diastolic CHF, NYHA class II and CKD stage III ([place])   . Blood transfusion 1972   "after daughter born, attempted to give me blood; couldn't give it cause my blood was cold" (09/23/2013)  . CAD (coronary artery disease)    a. LHC 08/23/11: dLM 40-50%, oRI 40%, oCFX 40%, oD1 70% (small and not amenable to PCI).  LM lesion did not appear to be flow limiting.  Medical rx was recommended;  b. Echo 08/23/11: mild LVH, EF 60-65%, grade 1 diast dysfxn, mild BAE, PASP 24;  c. 02/2012  Cath: LM 50d, LAD 50p, D1 70ost, RI 70p, RCA ok->Med Rx;  d. 01/2013 Cardiolite: EF 88, no ischemia/infarct.  . Carotid stenosis    a. dopplers 10/12:  0-39% bilat ICA;  b. 10/2012 U/S: 0-39% bilat, f/u 1 yr (10/2013).  . Chronic bronchitis   . Depression   . Diverticulosis of colon (without mention of hemorrhage)   . GERD (gastroesophageal reflux disease)   . Heart murmur, systolic    2-D echo in December 2011 showed a normal EF with grade 1 diastolic dysfunction, trivial pulmonary regurgitation and mildly elevated PA pressure at 37 mmHg probably secondary to her  COPD.dynamic obstruction-mid cavity obliteration;  b. 02/2012 Echo: EF 60-65%, mild LVH, PASP 84mHg.  .Marland KitchenHistory of stomach ulcers 1970's  . Hyperlipidemia   . Hypertension   . Hypothyroidism   . Migraines    a. Next atypical symptoms in the past. Patient was started on Neurontin for possible neuropathic origin of her pain.  . [place]a ([place]   . Shortness of breath on exertion    "just related to angina >1 yr ago" (09/23/2013)  . Sinus arrhythmia   . Type II diabetes mellitus ([place]    a. On oral hypoglycemic agents.    Past Surgical History:  Procedure Laterality Date  . CARDIAC CATHETERIZATION N/A 09/26/2015   Procedure: Right/Left Heart Cath and Coronary Angiography;  Surgeon: DLarey Dresser MD;  [facility];  Service: Cardiovascular;  Laterality: N/A;  . CATARACT EXTRACTION W/ INTRAOCULAR LENS  IMPLANT, BILATERAL Bilateral 1990's  . LEFT HEART CATHETERIZATION WITH CORONARY ANGIOGRAM N/A 03/16/2012   Procedure: LEFT HEART CATHETERIZATION WITH CORONARY ANGIOGRAM;  Surgeon: THillary Bow MD;  [facility];  Service: Cardiovascular;  Laterality: N/A;  . RIGHT/LEFT HEART CATH AND CORONARY ANGIOGRAPHY N/A 08/12/2017   Procedure: RIGHT/LEFT HEART CATH AND CORONARY ANGIOGRAPHY;  Surgeon: JMartinique Peter M, MD;  [facility];  Service: Cardiovascular;  Laterality: N/A;  . VAGINAL HYSTERECTOMY  1976    There were no vitals filed for this visit.  Subjective Assessment - 09/10/18 0803    Subjective  I am feeling good today. No pain.     Patient Stated Goals  reduce the prolapse    Currently in Pain?  No/denies                       [facility] Adult PT Treatment/Exercise - 09/10/18 0001      Therapeutic Activites    Therapeutic Activities  Other Therapeutic Activities    Other Therapeutic Activities  instructed patient on how to toilet correctly to relax the pelvic floor and push out the stool      Exercises   Exercises   Lumbar      Lumbar Exercises: Aerobic   Nustep  5 min, level 1   PT assessing patient     Lumbar Exercises: Supine   Bridge  15 reps;1 second    Isometric Hip Flexion  10 reps;5 seconds    Other Supine Lumbar Exercises  diaphgramatic breathing to have the diaphgram and pelvic floor work together             PT Education - 09/10/18 0835    Education Details  Toileting technique; intro to bowel health, diaphgramatic breathing, pelvic floor exercise    Person(s) Educated  Patient    Methods  Explanation;Demonstration;Verbal cues;Handout    Comprehension  Returned demonstration;Verbalized understanding       PT Short Term Goals - 09/10/18 0843      PT SHORT TERM GOAL #1   Title  independent with initial HEP with pelvic floor contraction    Time  4    Period  Weeks    Status  On-going      PT SHORT TERM GOAL #2   Title  abitliy to contract the pelvic floor and lower abdominal to improve continence    Time  4    Period  Weeks    Status  On-going      PT SHORT TERM GOAL #3   Title  understand correct toilet technique to fully evacuate his bowels    Period  Weeks    Status  On-going      PT SHORT TERM GOAL #4   Title  understand bowel health    Period  Weeks    Status  On-going        PT Long Term Goals - 09/03/18 0927      PT LONG TERM GOAL #1   Title  independent with HEP and understand how to progress herself    Time  8    Period  Weeks    Status  New    Target Date  10/29/18      PT LONG TERM GOAL #2   Title  improved fecal leakage >/= 50% due to improved strength    Time  8    Period  Weeks    Status  New    Target Date  10/29/18      PT LONG TERM GOAL #3   Title  rectal prolpase reduce 50% of the time due to imrpoved pelvic floor strength >/= 3/5 holding or 10 seconds    Time  8    Period  Weeks    Status  New    Target Date  10/29/18      PT LONG TERM GOAL #4  Title  understand ways to manage rectal prolpase     Time  8    Period  Weeks     Status  New    Target Date  10/29/18            Plan - 09/10/18 0806    Clinical Impression Statement  Patient has not met goals due to her just starting therapy.  Patient needed verbal and tactile cues to contract the pelvic floor instead of the gluteals.  Patient legs were tired after doing the nustep. Patient was instructed to start her floor bike to build up her endurance.  Patient will benefit from skilled therapy to improve pelvic floor strength and coordination to reduce fecal leakage.     Rehab Potential  Excellent    Clinical Impairments Affecting Rehab Potential  Plymyalgia rhumatica; osteopenia; dierticulosis; hypothyroidism, diabetes; vaginal hysterectomy; cardiac catherization    PT Frequency  1x / week    PT Duration  8 weeks    PT Treatment/Interventions  Biofeedback;Therapeutic exercise;Therapeutic activities;Neuromuscular re-education;Patient/family education;Manual techniques;Passive range of motion;Energy conservation    PT Next Visit Plan   hip stretches; pelvic floor contraction;     Recommended Other Services  MD signed the initial     Consulted and Agree with Plan of Care  Patient       Patient will benefit from skilled therapeutic intervention in order to improve the following deficits and impairments:  Increased fascial restricitons, Pain, Decreased coordination, Increased muscle spasms, Decreased activity tolerance, Decreased endurance, Decreased range of motion, Decreased strength, Impaired flexibility  Visit Diagnosis: Muscle weakness (generalized)  Unspecified lack of coordination  Rectal prolapse     Problem List Patient Active Problem List   Diagnosis Date Noted  . Acute right eye pain 07/28/2018  . Rectal prolapse 06/24/2018  . Temporal arteritis ([place]) 04/06/2018  . Chronic cough 10/30/2017  . Right knee pain 08/20/2017  . MDD (major depressive disorder), recurrent, severe, with psychosis ([place]) 11/30/2016  . Protein-calorie malnutrition,  severe 11/30/2016  . Elevated alkaline phosphatase level 09/18/2016  . Mixed incontinence urge and stress 05/09/2016  . Hypertensive retinopathy of both eyes, grade 2 04/20/2015  . Lumbar spondylosis 06/16/2014  . Carpal tunnel syndrome 02/18/2014  . Right shoulder pain 02/02/2014  . Osteopenia 01/11/2013  . Routine adult health maintenance 01/11/2013  . Diverticulosis 09/18/2012  . Dyspnea on exertion 03/24/2012  . Fatigue 02/26/2012  . Allergic rhinitis 02/13/2012  . Coronary artery disease 08/29/2011  . HEART MURMUR, SYSTOLIC 19/91/4445  . Enlargement of clavicle 03/30/2008  . Solitary pulmonary nodule 12/15/2006  . Hyperlipidemia 11/28/2006  . Hypothyroidism 09/04/2006  . Type 2 diabetes mellitus, controlled ([place]) 09/04/2006  . Migraine 09/04/2006  . Essential hypertension 09/04/2006  . GERD 09/04/2006    Earlie Counts, PT 09/10/18 8:45 AM   [facility] [address] [place], [place], [address] Phone: [phone]   Fax:  [phone]  Name: Christine [name] MRN: [id number] Date of Birth: 01/24/1934

## 2018-09-10 NOTE — Assessment & Plan Note (Signed)
She has had no increase in her myalgias.  She reports taking 7.5mg  of prednisone.   Plan Decrease prednisone to 6mg  daily for 4 weeks Return in 4 weeks.

## 2018-09-10 NOTE — Assessment & Plan Note (Signed)
BP was well controlled on check in the clinic, but has been averaging higher at home.   Plan Continue amlodipine Increase valsartan to 240mg  daily

## 2018-09-10 NOTE — Patient Instructions (Addendum)
Toileting Techniques for Bowel Movements (Defecation) Using your belly (abdomen) and pelvic floor muscles to have a bowel movement is usually instinctive.  Sometimes people can have problems with these muscles and have to relearn proper defecation (emptying) techniques.  If you have weakness in your muscles, organs that are falling out, decreased sensation in your pelvis, or ignore your urge to go, you may find yourself straining to have a bowel movement.  You are straining if you are: . holding your breath or taking in a huge gulp of air and holding it  . keeping your lips and jaw tensed and closed tightly . turning red in the face because of excessive pushing or forcing . developing or worsening your  hemorrhoids . getting faint while pushing . not emptying completely and have to defecate many times a day  If you are straining, you are actually making it harder for yourself to have a bowel movement.  Many people find they are pulling up with the pelvic floor muscles and closing off instead of opening the anus. Due to lack pelvic floor relaxation and coordination the abdominal muscles, one has to work harder to push the feces out.  Many people have never been taught how to defecate efficiently and effectively.  Notice what happens to your body when you are having a bowel movement.  While you are sitting on the toilet pay attention to the following areas: . Jaw and mouth position . Angle of your hips   . Whether your feet touch the ground or not . Arm placement  . Spine position . Waist . Belly tension . Anus (opening of the anal canal)  An Evacuation/Defecation Plan   Here are the 4 basic points:  1. Lean forward enough for your elbows to rest on your knees 2. Support your feet on the floor or use a low stool if your feet don't touch the floor  3. Push out your belly as if you have swallowed a beach ball-you should feel a widening of your waist 4. Open and relax your pelvic floor muscles,  rather than tightening around the anus      The following conditions my require modifications to your toileting posture:  . If you have had surgery in the past that limits your back, hip, pelvic, knee or ankle flexibility . Constipation   Your healthcare practitioner may make the following additional suggestions and adjustments:  1) Sit on the toilet  a) Make sure your feet are supported. b) Notice your hip angle and spine position-most people find it effective to lean forward or raise their knees, which can help the muscles around the anus to relax  c) When you lean forward, place your forearms on your thighs for support  2) Relax suggestions a) Breath deeply in through your nose and out slowly through your mouth as if you are smelling the flowers and blowing out the candles. b) To become aware of how to relax your muscles, contracting and releasing muscles can be helpful.  Pull your pelvic floor muscles in tightly by using the image of holding back gas, or closing around the anus (visualize making a circle smaller) and lifting the anus up and in.  Then release the muscles and your anus should drop down and feel open. Repeat 5 times ending with the feeling of relaxation. c) Keep your pelvic floor muscles relaxed; let your belly bulge out. d) The digestive tract starts at the mouth and ends at the anal opening, so be   sure to relax both ends of the tube.  Place your tongue on the roof of your mouth with your teeth separated.  This helps relax your mouth and will help to relax the anus at the same time.  3) Empty (defecation) a) Keep your pelvic floor and sphincter relaxed, then bulge your anal muscles.  Make the anal opening wide.  b) Stick your belly out as if you have swallowed a beach ball. c) Make your belly wall hard using your belly muscles while continuing to breathe. Doing this makes it easier to open your anus. d) Breath out and give a grunt (or try using other sounds such as  ahhhh, shhhhh, ohhhh or grrrrrrr).  4) Finish a) As you finish your bowel movement, pull the pelvic floor muscles up and in.  This will leave your anus in the proper place rather than remaining pushed out and down. If you leave your anus pushed out and down, it will start to feel as though that is normal and give you incorrect signals about needing to have a bowel movement.    Earlie Counts, PT [facility] [place] [address] [place], [place] [address] Hook-Lying    Lie with hips and knees bent. Allow body's muscles to relax. Place hands on belly. Inhale slowly and deeply for _3__ seconds, so hands move up. Then take _3__ seconds to exhale. Repeat _5__ times. Do __2_ times a day.   Copyright  VHI. All rights reserved.    Introduction to Bowel Health Diet and daily habits can help you predict when your bowels will move on a regular basis.  The consistency and quantity of the stool is usually more important than the frequency.  The goal is to have a regular bowel movement that is soft but formed.   Tips on Emptying Regularly . Eat breakfast.  Usually the best time of day for a bowel movement will be a half hour to an hour after eating.  These times are best because the body uses the gastrocolic reflex, a stimulation of bowel motion that occurs with eating, to help produce a bowel movement.  For some people even a simple hot drink in the morning can help the reflex action begin. . Eat all your meals at a predictable time each day.  The bowel functions best when food is introduced at the same regular intervals. . The amount of food eaten at a given time of day should be about the same size from day to day.  The bowel functions best when food is introduced in similar quantities from day to day. It is fine to have a small breakfast and a large lunch, or vice versa, just be consistent. . Eat two servings of fruit or vegetables and at least one serving of a complex  carbohydrates (whole grains such as brown rice, bran, whole wheat bread, or oatmeal) at each meal. . Drink plenty of water-ideally eight glasses a day.  Be sure to increase your water intake if you are increasing fiber into your diet.  Maintain Healthy Habits . Exercise daily.  You may exercise at any time of day, but you may find that bowel function is helped most if the exercise is at a consistent time each day. . Make sure that you are not rushed and have convenient access to a bathroom at your selected time to empty your bowels. .  Use your exercise bike 15 minutes per day for 4 weeks then increase to 20 minutes  per day for 4 weeks then increase to 25 minutes for 4 weeks then increase to 30 minutes per day and continues.    Slow Contraction: Gravity Eliminated (Side-Lying)    Lie on left side, hips and knees slightly bent. Slowly squeeze pelvic floor for _5__ seconds. Rest for __5_ seconds. Repeat __10_ times. Do _4__ times a day.   Copyright  VHI. All rights reserved.  [facility] [address] [place], [place] [address] Phone # [phone] Fax [phone]

## 2018-09-10 NOTE — Assessment & Plan Note (Addendum)
She is doing well.  BS log appears well controlled without lows.  Glimepiride is rightly listed in the [place]ers Criteria as best avoided, due to risk of prolonged hypoglycemia.  Will switch her to Januvia.  We discussed risks and benefits.   Check A1C at next appropriate interval.  MAU/Cr today.

## 2018-09-15 ENCOUNTER — Encounter: Payer: Self-pay | Admitting: Physical Therapy

## 2018-09-15 ENCOUNTER — Ambulatory Visit: Payer: Medicare Other | Admitting: Physical Therapy

## 2018-09-15 DIAGNOSIS — R279 Unspecified lack of coordination: Secondary | ICD-10-CM | POA: Diagnosis not present

## 2018-09-15 DIAGNOSIS — K623 Rectal prolapse: Secondary | ICD-10-CM | POA: Diagnosis not present

## 2018-09-15 DIAGNOSIS — M6281 Muscle weakness (generalized): Secondary | ICD-10-CM | POA: Diagnosis not present

## 2018-09-15 NOTE — Therapy (Signed)
[facility] [address] [place], [place], [address] Phone: [phone]   Fax:  [phone]  Physical Therapy Treatment  Patient Details  Name: Christine [name] MRN: [id number] Date of Birth: 01/24/1934 Referring Provider (PT): Dr. Stormy Card   Encounter Date: 09/15/2018  PT End of Session - 09/15/18 0849    Visit Number  4    Date for PT Re-Evaluation  10/29/18    Authorization Type  Medicare/Medicaid    PT Start Time  0845    PT Stop Time  0925    PT Time Calculation (min)  40 min    Activity Tolerance  Patient tolerated treatment well    Behavior During Therapy  [facility] for tasks assessed/performed       Past Medical History:  Diagnosis Date  . Anemia   . Anginal pain ([place])   . Arthritis    "back, arms, hips; hands" (11/30/2015)  . Benign hypertensive heart and kidney disease with diastolic CHF, NYHA class II and CKD stage III ([place])   . Blood transfusion 1972   "after daughter born, attempted to give me blood; couldn't give it cause my blood was cold" (09/23/2013)  . CAD (coronary artery disease)    a. LHC 08/23/11: dLM 40-50%, oRI 40%, oCFX 40%, oD1 70% (small and not amenable to PCI).  LM lesion did not appear to be flow limiting.  Medical rx was recommended;  b. Echo 08/23/11: mild LVH, EF 60-65%, grade 1 diast dysfxn, mild BAE, PASP 24;  c. 02/2012  Cath: LM 50d, LAD 50p, D1 70ost, RI 70p, RCA ok->Med Rx;  d. 01/2013 Cardiolite: EF 88, no ischemia/infarct.  . Carotid stenosis    a. dopplers 10/12:  0-39% bilat ICA;  b. 10/2012 U/S: 0-39% bilat, f/u 1 yr (10/2013).  . Chronic bronchitis   . Depression   . Diverticulosis of colon (without mention of hemorrhage)   . GERD (gastroesophageal reflux disease)   . Heart murmur, systolic    2-D echo in December 2011 showed a normal EF with grade 1 diastolic dysfunction, trivial pulmonary regurgitation and mildly elevated PA pressure at 37 mmHg probably secondary to her  COPD.dynamic obstruction-mid cavity obliteration;  b. 02/2012 Echo: EF 60-65%, mild LVH, PASP 49mmHg.  Marland Kitchen History of stomach ulcers 1970's  . Hyperlipidemia   . Hypertension   . Hypothyroidism   . Migraines    a. Next atypical symptoms in the past. Patient was started on Neurontin for possible neuropathic origin of her pain.  . Polymyalgia rheumatica ([place])   . Shortness of breath on exertion    "just related to angina >1 yr ago" (09/23/2013)  . Sinus arrhythmia   . Type II diabetes mellitus ([place])    a. On oral hypoglycemic agents.    Past Surgical History:  Procedure Laterality Date  . CARDIAC CATHETERIZATION N/A 09/26/2015   Procedure: Right/Left Heart Cath and Coronary Angiography;  Surgeon: Larey Dresser, MD;  Location: [facility];  Service: Cardiovascular;  Laterality: N/A;  . CATARACT EXTRACTION W/ INTRAOCULAR LENS  IMPLANT, BILATERAL Bilateral 1990's  . LEFT HEART CATHETERIZATION WITH CORONARY ANGIOGRAM N/A 03/16/2012   Procedure: LEFT HEART CATHETERIZATION WITH CORONARY ANGIOGRAM;  Surgeon: Hillary Bow, MD;  Location[facility];  Service: Cardiovascular;  Laterality: N/A;  . RIGHT/LEFT HEART CATH AND CORONARY ANGIOGRAPHY N/A 08/12/2017   Procedure: RIGHT/LEFT HEART CATH AND CORONARY ANGIOGRAPHY;  Surgeon: Martinique, Peter M, MD;  Location: [place] [facility];  Service: Cardiovascular;  Laterality: N/A;  . VAGINAL HYSTERECTOMY  1976    There were no vitals filed for this visit.  Subjective Assessment - 09/15/18 0848    Subjective  I am using a fiber pill. No pain. I am feeling stronger. I have been doing my bike at home. Stool is consistent with mashed potatoes daily. May have little pieces of stool. Patient does not feel the urge to go to the bathroom.     Patient Stated Goals  reduce the prolapse    Currently in Pain?  No/denies    Multiple Pain Sites  No         [facility] PT Assessment - 09/15/18 0001      Strength   Right Hip External Rotation   4/5    Left Hip  External Rotation  4/5                   [facility] Adult PT Treatment/Exercise - 09/15/18 0001      Self-Care   Self-Care  Other Self-Care Comments    Other Self-Care Comments   information on bowel health, fiber intake, water intake      Exercises   Exercises  Knee/Hip;Lumbar      Lumbar Exercises: Aerobic   Nustep  6 min, level 2   PT assessing patient     Lumbar Exercises: Supine   Bridge  15 reps;5 seconds   pelvic floor squeeze   Isometric Hip Flexion  10 reps;5 seconds    Other Supine Lumbar Exercises  diaphgramatic breathing to have the diaphgram and pelvic floor work together    Other Supine Lumbar Exercises  gluteal squeezes hold 5 second      Knee/Hip Exercises: Standing   Heel Raises  Both;1 set;15 reps;1 second   squeeze gluteals   Hip Abduction  Right;Left;1 set;10 reps;Knee straight   foot on slider; pull up pelvic floor   Hip Extension  Stengthening;Right;Left;1 set;10 reps;Knee straight   pull up pelvic floor   Wall Squat  1 set;5 reps;5 seconds   breath out when stand; pelvic floor contraction            PT Education - 09/15/18 0927    Education Details  information on diet, bowel health, and fiber intake    Person(s) Educated  Patient    Methods  Explanation;Handout    Comprehension  Verbalized understanding       PT Short Term Goals - 09/15/18 0930      PT SHORT TERM GOAL #1   Title  independent with initial HEP with pelvic floor contraction    Time  4    Period  Weeks    Status  Achieved      PT SHORT TERM GOAL #3   Title  understand correct toilet technique to fully evacuate his bowels    Time  4    Period  Weeks    Status  Achieved      PT SHORT TERM GOAL #4   Title  understand bowel health    Time  4    Period  Weeks    Status  Achieved        PT Long Term Goals - 09/15/18 0915      PT LONG TERM GOAL #1   Title  independent with HEP and understand how to progress herself    Time  8    Period  Weeks    Status   On-going  PT LONG TERM GOAL #2   Title  improved fecal leakage >/= 50% due to improved strength    Time  8    Period  Weeks    Status  On-going      PT LONG TERM GOAL #3   Title  rectal prolpase reduce 50% of the time due to imrpoved pelvic floor strength >/= 3/5 holding or 10 seconds    Time  8    Period  Weeks    Status  On-going      PT LONG TERM GOAL #4   Title  understand ways to manage rectal prolpase     Time  8    Period  Weeks    Status  On-going            Plan - 09/15/18 0850    Clinical Impression Statement  Patient is able to perfrom her exercises correctly.  Patient will be increasing her soluble fiber intake. Patient does not have the feeling when she has stool leakage.  Patient prolapse will come out when standing too long. Patient was able to do 6 minutes on the nustep at level 2 with less fatique. Patient will benefit from skilled therapy to improve pelvic floor strength and coordination to reduce fecal leakage.     Rehab Potential  Excellent    Clinical Impairments Affecting Rehab Potential  Plymyalgia rhumatica; osteopenia; dierticulosis; hypothyroidism, diabetes; vaginal hysterectomy; cardiac catherization    PT Frequency  1x / week    PT Duration  8 weeks    PT Treatment/Interventions  Biofeedback;Therapeutic exercise;Therapeutic activities;Neuromuscular re-education;Patient/family education;Manual techniques;Passive range of motion;Energy conservation    PT Next Visit Plan  pelvic floor EMG    Consulted and Agree with Plan of Care  Patient       Patient will benefit from skilled therapeutic intervention in order to improve the following deficits and impairments:  Increased fascial restricitons, Pain, Decreased coordination, Increased muscle spasms, Decreased activity tolerance, Decreased endurance, Decreased range of motion, Decreased strength, Impaired flexibility  Visit Diagnosis: Muscle weakness (generalized)  Unspecified lack of  coordination  Rectal prolapse     Problem List Patient Active Problem List   Diagnosis Date Noted  . Acute right eye pain 07/28/2018  . Rectal prolapse 06/24/2018  . Temporal arteritis ([place]) 04/06/2018  . Chronic cough 10/30/2017  . Right knee pain 08/20/2017  . MDD (major depressive disorder), recurrent, in full remission ([place]) 11/30/2016  . Protein-calorie malnutrition, severe 11/30/2016  . Elevated alkaline phosphatase level 09/18/2016  . Mixed incontinence urge and stress 05/09/2016  . Hypertensive retinopathy of both eyes, grade 2 04/20/2015  . Lumbar spondylosis 06/16/2014  . Carpal tunnel syndrome 02/18/2014  . Right shoulder pain 02/02/2014  . Osteopenia 01/11/2013  . Routine adult health maintenance 01/11/2013  . Diverticulosis 09/18/2012  . Dyspnea on exertion 03/24/2012  . Fatigue 02/26/2012  . Allergic rhinitis 02/13/2012  . Coronary artery disease 08/29/2011  . HEART MURMUR, SYSTOLIC 93/26/7124  . Enlargement of clavicle 03/30/2008  . Solitary pulmonary nodule 12/15/2006  . Hyperlipidemia 11/28/2006  . Hypothyroidism 09/04/2006  . Type 2 diabetes mellitus, controlled ([place]) 09/04/2006  . Migraine 09/04/2006  . Essential hypertension 09/04/2006  . GERD 09/04/2006    Earlie Counts, PT 09/15/18 9:31 AM    [facility] [address], [place], [place], [address] Phone: [phone]   Fax:  [phone]  Name: ELSEY HOLTS MRN: [id number] Date of Birth: 01/24/1934

## 2018-09-15 NOTE — Patient Instructions (Addendum)
Introduction to Bowel Health Diet and daily habits can help you predict when your bowels will move on a regular basis.  The consistency and quantity of the stool is usually more important than the frequency.  The goal is to have a regular bowel movement that is soft but formed.   Tips on Emptying Regularly . Eat breakfast.  Usually the best time of day for a bowel movement will be a half hour to an hour after eating.  These times are best because the body uses the gastrocolic reflex, a stimulation of bowel motion that occurs with eating, to help produce a bowel movement.  For some people even a simple hot drink in the morning can help the reflex action begin. . Eat all your meals at a predictable time each day.  The bowel functions best when food is introduced at the same regular intervals. . The amount of food eaten at a given time of day should be about the same size from day to day.  The bowel functions best when food is introduced in similar quantities from day to day. It is fine to have a small breakfast and a large lunch, or vice versa, just be consistent. . Eat two servings of fruit or vegetables and at least one serving of a complex carbohydrates (whole grains such as brown rice, bran, whole wheat bread, or oatmeal) at each meal. . Drink plenty of water-ideally eight glasses a day.  Be sure to increase your water intake if you are increasing fiber into your diet.  Maintain Healthy Habits . Exercise daily.  You may exercise at any time of day, but you may find that bowel function is helped most if the exercise is at a consistent time each day. . Make sure that you are not rushed and have convenient access to a bathroom at your selected time to empty your bowels. .  Types of Fiber  There are two main types of fiber:  insoluble and soluble.  Both of these types can prevent and relieve constipation and diarrhea, although some people find one or the other to be more easily digested.  This handout  details information about both types of fiber.  Insoluble Fiber       Functions of Insoluble Fiber . moves bulk through the intestines  . controls and balances the pH (acidity) in the intestines       Benefits of Insoluble Fiber . promotes regular bowel movement and prevents constipation  . removes fecal waste through colon in less time  . keeps an optimal pH in intestines to prevent microbes from producing cancer substances, therefore preventing colon cancer        Food Sources of Insoluble Fiber . whole-wheat products  . wheat bran "miller's bran" . corn bran  . flax seed or other seeds . vegetables such as green beans, broccoli, cauliflower and potato skins  . fruit skins and root vegetable skins  . popcorn . brown rice  Soluble Fiber       Functions of Soluble Fiber  . holds water in the colon to bulk and soften the stool . prolongs stomach emptying time so that sugar is released and absorbed more slowly        Benefits of Soluble Fiber . lowers total cholesterol and LDL cholesterol (the bad cholesterol) therefore reducing the risk of heart disease  . regulates blood sugar for people with diabetes       Food Sources of Soluble Fiber . oat/oat bran .  dried beans and peas  . nuts  . barley  . flax seed or other seeds . fruits such as oranges, pears, peaches, and apples  . vegetables such as carrots  . psyllium husk  . prunes [facility] [address] [place], [place] [address] Phone # [phone] Fax [phone]

## 2018-09-16 ENCOUNTER — Encounter: Payer: Self-pay | Admitting: Internal Medicine

## 2018-09-21 ENCOUNTER — Encounter: Payer: Self-pay | Admitting: Physical Therapy

## 2018-09-21 ENCOUNTER — Ambulatory Visit: Payer: Medicare Other | Attending: Gastroenterology | Admitting: Physical Therapy

## 2018-09-21 VITALS — BP 130/60

## 2018-09-21 DIAGNOSIS — K623 Rectal prolapse: Secondary | ICD-10-CM | POA: Diagnosis not present

## 2018-09-21 DIAGNOSIS — M6281 Muscle weakness (generalized): Secondary | ICD-10-CM | POA: Insufficient documentation

## 2018-09-21 DIAGNOSIS — R279 Unspecified lack of coordination: Secondary | ICD-10-CM | POA: Diagnosis not present

## 2018-09-21 NOTE — Patient Instructions (Addendum)
Slow Contraction: Gravity Eliminated (Side-Lying)    Lie on left side, hips and knees slightly bent. Slowly squeeze pelvic floor for _10__ seconds. Rest for _10__ seconds. Repeat __5_ times. Do _2__ times a day. Then end with 5 quick contractions  Copyright  VHI. All rights reserved.  [place] [address] [place], [place] [address] Phone # [phone] Fax [phone]

## 2018-09-21 NOTE — Therapy (Signed)
[facility] [address], [place], [place], [address] Phone: [phone]   Fax:  [phone]  Physical Therapy Treatment  Patient Details  Name: Christine [name] MRN: [id number] Date of Birth: 01/24/1934 Referring Provider (PT): Dr. Stormy Card   Encounter Date: 09/21/2018  PT End of Session - 09/21/18 0845    Visit Number  5    Date for PT Re-Evaluation  10/29/18    Authorization Type  Medicare/Medicaid    PT Start Time  0845    PT Stop Time  0925    PT Time Calculation (min)  40 min    Activity Tolerance  Patient tolerated treatment well    Behavior During Therapy  [facility] for tasks assessed/performed       Past Medical History:  Diagnosis Date  . Anemia   . Anginal pain ([place])   . Arthritis    "back, arms, hips; hands" (11/30/2015)  . Benign hypertensive heart and kidney disease with diastolic CHF, NYHA class II and CKD stage III ([place])   . Blood transfusion 1972   "after daughter born, attempted to give me blood; couldn't give it cause my blood was cold" (09/23/2013)  . CAD (coronary artery disease)    a. LHC 08/23/11: dLM 40-50%, oRI 40%, oCFX 40%, oD1 70% (small and not amenable to PCI).  LM lesion did not appear to be flow limiting.  Medical rx was recommended;  b. Echo 08/23/11: mild LVH, EF 60-65%, grade 1 diast dysfxn, mild BAE, PASP 24;  c. 02/2012  Cath: LM 50d, LAD 50p, D1 70ost, RI 70p, RCA ok->Med Rx;  d. 01/2013 Cardiolite: EF 88, no ischemia/infarct.  . Carotid stenosis    a. dopplers 10/12:  0-39% bilat ICA;  b. 10/2012 U/S: 0-39% bilat, f/u 1 yr (10/2013).  . Chronic bronchitis   . Depression   . Diverticulosis of colon (without mention of hemorrhage)   . GERD (gastroesophageal reflux disease)   . Heart murmur, systolic    2-D echo in December 2011 showed a normal EF with grade 1 diastolic dysfunction, trivial pulmonary regurgitation and mildly elevated PA pressure at 37 mmHg probably secondary to her  COPD.dynamic obstruction-mid cavity obliteration;  b. 02/2012 Echo: EF 60-65%, mild LVH, PASP 74mmHg.  Marland Kitchen History of stomach ulcers 1970's  . Hyperlipidemia   . Hypertension   . Hypothyroidism   . Migraines    a. Next atypical symptoms in the past. Patient was started on Neurontin for possible neuropathic origin of her pain.  . Polymyalgia rheumatica ([place])   . Shortness of breath on exertion    "just related to angina >1 yr ago" (09/23/2013)  . Sinus arrhythmia   . Type II diabetes mellitus ([place])    a. On oral hypoglycemic agents.    Past Surgical History:  Procedure Laterality Date  . CARDIAC CATHETERIZATION N/A 09/26/2015   Procedure: Right/Left Heart Cath and Coronary Angiography;  Surgeon: Larey Dresser, MD;  Location: [facility];  Service: Cardiovascular;  Laterality: N/A;  . CATARACT EXTRACTION W/ INTRAOCULAR LENS  IMPLANT, BILATERAL Bilateral 1990's  . LEFT HEART CATHETERIZATION WITH CORONARY ANGIOGRAM N/A 03/16/2012   Procedure: LEFT HEART CATHETERIZATION WITH CORONARY ANGIOGRAM;  Surgeon: Hillary Bow, MD;  Location: [facility] CATH LAB;  Service: Cardiovascular;  Laterality: N/A;  . RIGHT/LEFT HEART CATH AND CORONARY ANGIOGRAPHY N/A 08/12/2017   Procedure: RIGHT/LEFT HEART CATH AND CORONARY ANGIOGRAPHY;  Surgeon: Martinique, Peter M, MD;  Location: [facility];  Service: Cardiovascular;  Laterality: N/A;  . VAGINAL HYSTERECTOMY  1976    Vitals:   09/21/18 0852  BP: 130/60    Subjective Assessment - 09/21/18 0845    Subjective  The prolapse came out a little yesterday and friday. The prolapse did not come out like it has in the past. This morning I am a little dizzy.     Patient Stated Goals  reduce the prolapse    Currently in Pain?  No/denies                    Pelvic Floor Special Questions - 09/21/18 0001    Biofeedback  sidely rest level is 2 uv; quick contraction 35 uv, contract 5 sec 25 uv but will fatique,       Biofeedback sensor type   Surface   rectal   Biofeedback Activity  Quick contraction;10 second hold                PT Education - 09/21/18 0921    Education Details  pelvic floor exercise in sidely    Person(s) Educated  Patient    Methods  Explanation;Demonstration;Verbal cues;Handout    Comprehension  Verbalized understanding;Returned demonstration       PT Short Term Goals - 09/15/18 0930      PT SHORT TERM GOAL #1   Title  independent with initial HEP with pelvic floor contraction    Time  4    Period  Weeks    Status  Achieved      PT SHORT TERM GOAL #3   Title  understand correct toilet technique to fully evacuate his bowels    Time  4    Period  Weeks    Status  Achieved      PT SHORT TERM GOAL #4   Title  understand bowel health    Time  4    Period  Weeks    Status  Achieved        PT Long Term Goals - 09/21/18 7846      PT LONG TERM GOAL #1   Title  independent with HEP and understand how to progress herself    Baseline  still learning    Time  8    Period  Weeks    Status  On-going      PT LONG TERM GOAL #2   Title  improved fecal leakage >/= 50% due to improved strength    Time  8    Period  Weeks    Status  On-going      PT LONG TERM GOAL #3   Title  rectal prolpase reduce 50% of the time due to imrpoved pelvic floor strength >/= 3/5 holding or 10 seconds    Baseline  60% improved but still has difficulty holding ofr 10 seconds    Time  8    Period  Weeks    Status  On-going      PT LONG TERM GOAL #4   Title  understand ways to manage rectal prolpase     Time  8    Period  Weeks    Status  Achieved            Plan - 09/21/18 9629    Clinical Impression Statement  Patient is able to quickly contract the pelvic floor to 35 uv and rest a 2 uv. Patient can contract to 25 uv with 5 second contraction.  Patient able to contract 10  seconds but the last 3 second shows fatique. Patient reports her prolapse does not come out as far.  Patient reports her  prolapse is 60% better. Patient will benefit from skilled therapy to improve pelvic floor strength and cooreidnation to redcue fecal leakage.     Rehab Potential  Excellent    Clinical Impairments Affecting Rehab Potential  Plymyalgia rhumatica; osteopenia; dierticulosis; hypothyroidism, diabetes; vaginal hysterectomy; cardiac catherization    PT Frequency  1x / week    PT Duration  8 weeks    PT Treatment/Interventions  Biofeedback;Therapeutic exercise;Therapeutic activities;Neuromuscular re-education;Patient/family education;Manual techniques;Passive range of motion;Energy conservation    PT Next Visit Plan  pelvic floor EMG in sidely with 10 second contraction working toward 20 seconds contraction    Consulted and Agree with Plan of Care  Patient       Patient will benefit from skilled therapeutic intervention in order to improve the following deficits and impairments:  Increased fascial restricitons, Pain, Decreased coordination, Increased muscle spasms, Decreased activity tolerance, Decreased endurance, Decreased range of motion, Decreased strength, Impaired flexibility  Visit Diagnosis: Muscle weakness (generalized)  Unspecified lack of coordination  Rectal prolapse     Problem List Patient Active Problem List   Diagnosis Date Noted  . Acute right eye pain 07/28/2018  . Rectal prolapse 06/24/2018  . Temporal arteritis ([place]) 04/06/2018  . Chronic cough 10/30/2017  . Right knee pain 08/20/2017  . MDD (major depressive disorder), recurrent, in full remission ([place]) 11/30/2016  . Protein-calorie malnutrition, severe 11/30/2016  . Elevated alkaline phosphatase level 09/18/2016  . Mixed incontinence urge and stress 05/09/2016  . Hypertensive retinopathy of both eyes, grade 2 04/20/2015  . Lumbar spondylosis 06/16/2014  . Carpal tunnel syndrome 02/18/2014  . Right shoulder pain 02/02/2014  . Osteopenia 01/11/2013  . Routine adult health maintenance 01/11/2013  . Diverticulosis  09/18/2012  . Dyspnea on exertion 03/24/2012  . Fatigue 02/26/2012  . Allergic rhinitis 02/13/2012  . Coronary artery disease 08/29/2011  . HEART MURMUR, SYSTOLIC 29/24/4628  . Enlargement of clavicle 03/30/2008  . Solitary pulmonary nodule 12/15/2006  . Hyperlipidemia 11/28/2006  . Hypothyroidism 09/04/2006  . Type 2 diabetes mellitus, controlled ([place]) 09/04/2006  . Migraine 09/04/2006  . Essential hypertension 09/04/2006  . GERD 09/04/2006    Earlie Counts, PT 09/21/18 9:26 AM   [facility] [address] [place], [place], [address] Phone: [phone]   Fax:  [phone]  Name: Christine [name] MRN: [id number] Date of Birth: 01/24/1934

## 2018-09-24 ENCOUNTER — Other Ambulatory Visit: Payer: Self-pay | Admitting: Gastroenterology

## 2018-09-28 ENCOUNTER — Ambulatory Visit: Payer: Medicare Other | Admitting: Physical Therapy

## 2018-09-28 ENCOUNTER — Encounter: Payer: Self-pay | Admitting: Physical Therapy

## 2018-09-28 DIAGNOSIS — R279 Unspecified lack of coordination: Secondary | ICD-10-CM

## 2018-09-28 DIAGNOSIS — K623 Rectal prolapse: Secondary | ICD-10-CM | POA: Diagnosis not present

## 2018-09-28 DIAGNOSIS — M6281 Muscle weakness (generalized): Secondary | ICD-10-CM | POA: Diagnosis not present

## 2018-09-28 NOTE — Therapy (Signed)
[facility] [address] [place], [place], [address] Phone: [phone]   Fax:  [phone]  Physical Therapy Treatment  Patient Details  Name: Christine [name] MRN: [id number] Date of Birth: 01/24/1934 Referring Provider (PT): Dr. Stormy Card   Encounter Date: 09/28/2018  PT End of Session - 09/28/18 1005    Visit Number  6    Date for PT Re-Evaluation  10/29/18    Authorization Type  Medicare/Medicaid    PT Start Time  0930    PT Stop Time  1010    PT Time Calculation (min)  40 min    Activity Tolerance  Patient tolerated treatment well    Behavior During Therapy  [facility] for tasks assessed/performed       Past Medical History:  Diagnosis Date  . Anemia   . Anginal pain ([place])   . Arthritis    "back, arms, hips; hands" (11/30/2015)  . Benign hypertensive heart and kidney disease with diastolic CHF, NYHA class II and CKD stage III ([place])   . Blood transfusion 1972   "after daughter born, attempted to give me blood; couldn't give it cause my blood was cold" (09/23/2013)  . CAD (coronary artery disease)    a. LHC 08/23/11: dLM 40-50%, oRI 40%, oCFX 40%, oD1 70% (small and not amenable to PCI).  LM lesion did not appear to be flow limiting.  Medical rx was recommended;  b. Echo 08/23/11: mild LVH, EF 60-65%, grade 1 diast dysfxn, mild BAE, PASP 24;  c. 02/2012  Cath: LM 50d, LAD 50p, D1 70ost, RI 70p, RCA ok->Med Rx;  d. 01/2013 Cardiolite: EF 88, no ischemia/infarct.  . Carotid stenosis    a. dopplers 10/12:  0-39% bilat ICA;  b. 10/2012 U/S: 0-39% bilat, f/u 1 yr (10/2013).  . Chronic bronchitis   . Depression   . Diverticulosis of colon (without mention of hemorrhage)   . GERD (gastroesophageal reflux disease)   . Heart murmur, systolic    2-D echo in December 2011 showed a normal EF with grade 1 diastolic dysfunction, trivial pulmonary regurgitation and mildly elevated PA pressure at 37 mmHg probably secondary to her  COPD.dynamic obstruction-mid cavity obliteration;  b. 02/2012 Echo: EF 60-65%, mild LVH, PASP 69mmHg.  Marland Kitchen History of stomach ulcers 1970's  . Hyperlipidemia   . Hypertension   . Hypothyroidism   . Migraines    a. Next atypical symptoms in the past. Patient was started on Neurontin for possible neuropathic origin of her pain.  . Polymyalgia rheumatica ([place])   . Shortness of breath on exertion    "just related to angina >1 yr ago" (09/23/2013)  . Sinus arrhythmia   . Type II diabetes mellitus ([place])    a. On oral hypoglycemic agents.    Past Surgical History:  Procedure Laterality Date  . CARDIAC CATHETERIZATION N/A 09/26/2015   Procedure: Right/Left Heart Cath and Coronary Angiography;  Surgeon: Larey Dresser, MD;  Location: [place] [facility];  Service: Cardiovascular;  Laterality: N/A;  . CATARACT EXTRACTION W/ INTRAOCULAR LENS  IMPLANT, BILATERAL Bilateral 1990's  . LEFT HEART CATHETERIZATION WITH CORONARY ANGIOGRAM N/A 03/16/2012   Procedure: LEFT HEART CATHETERIZATION WITH CORONARY ANGIOGRAM;  Surgeon: Hillary Bow, MD;  Location: [facility] CATH [facility];  Service: Cardiovascular;  Laterality: N/A;  . RIGHT/LEFT HEART CATH AND CORONARY ANGIOGRAPHY N/A 08/12/2017   Procedure: RIGHT/LEFT HEART CATH AND CORONARY ANGIOGRAPHY;  Surgeon: Martinique, Peter M, MD;  Location: [place] [facility];  Service: Cardiovascular;  Laterality: N/A;  . VAGINAL HYSTERECTOMY  1976    There were no vitals filed for this visit.  Subjective Assessment - 09/28/18 0932    Subjective  I still have some leakage. Some days are better than others. I tried the toileting technique and it worked.     Patient Stated Goals  reduce the prolapse    Currently in Pain?  No/denies                    Pelvic Floor Special Questions - 09/28/18 0001    External Perineal Exam  significant rectal prolapse that was reduced by the therapist.     Pelvic Floor Internal Exam  Patient comfirms identification and approves PT to  assess pelvic floor and treatment    Exam Type  Rectal    Biofeedback  sidely contract 30 uv for 10 sec but was holding her breath; contract 3 seconds with keeping mouth open and not substitute withcontracting the gluteals    Biofeedback sensor type  Surface   rectal   Biofeedback Activity  10 second hold   sidely       [facility] Adult PT Treatment/Exercise - 09/28/18 0001      Exercises   Exercises  Lumbar      Lumbar Exercises: Stretches   Active Hamstring Stretch  Right;Left;2 reps;30 seconds   sitting   Piriformis Stretch  Right;Left;2 reps;30 seconds   sitting     Lumbar Exercises: Aerobic   Nustep  6 min, level 2   PT assessing patient              PT Short Term Goals - 09/28/18 0935      PT SHORT TERM GOAL #1   Title  independent with initial HEP with pelvic floor contraction    Time  4    Period  Weeks    Status  Achieved      PT SHORT TERM GOAL #2   Title  abitliy to contract the pelvic floor and lower abdominal to improve continence    Time  4    Period  Weeks    Status  Achieved      PT SHORT TERM GOAL #3   Title  understand correct toilet technique to fully evacuate his bowels    Time  4    Period  Weeks    Status  Achieved      PT SHORT TERM GOAL #4   Title  understand bowel health    Time  4    Period  Weeks    Status  Achieved        PT Long Term Goals - 09/21/18 0454      PT LONG TERM GOAL #1   Title  independent with HEP and understand how to progress herself    Baseline  still learning    Time  8    Period  Weeks    Status  On-going      PT LONG TERM GOAL #2   Title  improved fecal leakage >/= 50% due to improved strength    Time  8    Period  Weeks    Status  On-going      PT LONG TERM GOAL #3   Title  rectal prolpase reduce 50% of the time due to imrpoved pelvic floor strength >/= 3/5 holding or 10 seconds    Baseline  60% improved but still has difficulty holding ofr 10 seconds  Time  8    Period  Weeks    Status   On-going      PT LONG TERM GOAL #4   Title  understand ways to manage rectal prolpase     Time  8    Period  Weeks    Status  Achieved            Plan - 09/28/18 1005    Clinical Impression Statement  Pateitn had a significant rectal prolapse today that the therapist was able to reduce. Patient still needs verbal cues to not hold her breath or squeeze her gluteals with pelvic floor contraction. Patient is able to hold 5 seconds with not holding her breath and isolating the muscle. Patient able to contract to 15 uv with 5 and 10 second contraction in sidely. AFter therapy there was not rectal prolapse. Patient will benefit from skilled therapy to improve pelvic floor strength and coordination to reduce fecal leakage and rectal prolapse.      Rehab Potential  Excellent    Clinical Impairments Affecting Rehab Potential  Plymyalgia rhumatica; osteopenia; dierticulosis; hypothyroidism, diabetes; vaginal hysterectomy; cardiac catherization    PT Frequency  1x / week    PT Duration  8 weeks    PT Treatment/Interventions  Biofeedback;Therapeutic exercise;Therapeutic activities;Neuromuscular re-education;Patient/family education;Manual techniques;Passive range of motion;Energy conservation    PT Next Visit Plan  pelvic floor EMG in sidely with 10 second contraction working toward 20 seconds contraction    Consulted and Agree with Plan of Care  Patient       Patient will benefit from skilled therapeutic intervention in order to improve the following deficits and impairments:  Increased fascial restricitons, Pain, Decreased coordination, Increased muscle spasms, Decreased activity tolerance, Decreased endurance, Decreased range of motion, Decreased strength, Impaired flexibility  Visit Diagnosis: Muscle weakness (generalized)  Unspecified lack of coordination  Rectal prolapse     Problem List Patient Active Problem List   Diagnosis Date Noted  . Acute right eye pain 07/28/2018  .  Rectal prolapse 06/24/2018  . Temporal arteritis ([place]) 04/06/2018  . Chronic cough 10/30/2017  . Right knee pain 08/20/2017  . MDD (major depressive disorder), recurrent, in full remission ([place]) 11/30/2016  . Protein-calorie malnutrition, severe 11/30/2016  . Elevated alkaline phosphatase level 09/18/2016  . Mixed incontinence urge and stress 05/09/2016  . Hypertensive retinopathy of both eyes, grade 2 04/20/2015  . Lumbar spondylosis 06/16/2014  . Carpal tunnel syndrome 02/18/2014  . Right shoulder pain 02/02/2014  . Osteopenia 01/11/2013  . Routine adult health maintenance 01/11/2013  . Diverticulosis 09/18/2012  . Dyspnea on exertion 03/24/2012  . Fatigue 02/26/2012  . Allergic rhinitis 02/13/2012  . Coronary artery disease 08/29/2011  . HEART MURMUR, SYSTOLIC 34/28/7681  . Enlargement of clavicle 03/30/2008  . Solitary pulmonary nodule 12/15/2006  . Hyperlipidemia 11/28/2006  . Hypothyroidism 09/04/2006  . Type 2 diabetes mellitus, controlled ([place]) 09/04/2006  . Migraine 09/04/2006  . Essential hypertension 09/04/2006  . GERD 09/04/2006    Earlie Counts, PT 09/28/18 10:14 AM   [facility] [address], [place], [place], [address] Phone: [phone]   Fax:  [phone]  Name: ALBIE BAZIN MRN: [id number] Date of Birth: 01/24/1934

## 2018-09-29 ENCOUNTER — Telehealth: Payer: Self-pay

## 2018-09-29 NOTE — Telephone Encounter (Signed)
Pt calls and states she is not able to take [place], she states her daughter looked up the side effects and it can cause cancer or death, triage ask her if she had problems taking it and she stated no, I havent started but im not going to I cant bring myself to take something with side effects like that. She would like her old medication and states she took the last of her old medication this am. Please advise

## 2018-09-29 NOTE — Telephone Encounter (Signed)
Requesting to speak with a nurse about med. Please call back.  

## 2018-09-30 NOTE — Telephone Encounter (Signed)
Dr Daryll Drown, this somehow was closed

## 2018-10-01 NOTE — Telephone Encounter (Signed)
Called pt and reassured her gave her dr [employer] message, she states she still does not want to take. Told her not to worry, that dr Daryll Drown will talk to her when she comes for appt.

## 2018-10-01 NOTE — Telephone Encounter (Signed)
Sitagliptin has significantly fewer side effects than glimepiride.  She is due to come in on 11/20.  Please let her know that we can discuss further at that time.   Thank you  Gilles Chiquito, MD

## 2018-10-05 ENCOUNTER — Ambulatory Visit: Payer: Medicare Other | Admitting: Physical Therapy

## 2018-10-05 ENCOUNTER — Encounter: Payer: Self-pay | Admitting: Physical Therapy

## 2018-10-05 DIAGNOSIS — R279 Unspecified lack of coordination: Secondary | ICD-10-CM | POA: Diagnosis not present

## 2018-10-05 DIAGNOSIS — K623 Rectal prolapse: Secondary | ICD-10-CM

## 2018-10-05 DIAGNOSIS — M6281 Muscle weakness (generalized): Secondary | ICD-10-CM | POA: Diagnosis not present

## 2018-10-05 NOTE — Therapy (Signed)
[facility] [address] [place], [place], [address] Phone: [phone]   Fax:  [phone]  Physical Therapy Treatment  Patient Details  Name: Christine [name] MRN: [id number] Date of Birth: 01/24/1934 Referring Provider (PT): Dr. Stormy Card   Encounter Date: 10/05/2018  PT End of Session - 10/05/18 0857    Visit Number  7    Date for PT Re-Evaluation  10/29/18    Authorization Type  Medicare/Medicaid    PT Start Time  0845    PT Stop Time  0925    PT Time Calculation (min)  40 min    Activity Tolerance  Patient tolerated treatment well    Behavior During Therapy  [facility] for tasks assessed/performed       Past Medical History:  Diagnosis Date  . Anemia   . Anginal pain ([place])   . Arthritis    "back, arms, hips; hands" (11/30/2015)  . Benign hypertensive heart and kidney disease with diastolic CHF, NYHA class II and CKD stage III ([place])   . Blood transfusion 1972   "after daughter born, attempted to give me blood; couldn't give it cause my blood was cold" (09/23/2013)  . CAD (coronary artery disease)    a. LHC 08/23/11: dLM 40-50%, oRI 40%, oCFX 40%, oD1 70% (small and not amenable to PCI).  LM lesion did not appear to be flow limiting.  Medical rx was recommended;  b. Echo 08/23/11: mild LVH, EF 60-65%, grade 1 diast dysfxn, mild BAE, PASP 24;  c. 02/2012  Cath: LM 50d, LAD 50p, D1 70ost, RI 70p, RCA ok->Med Rx;  d. 01/2013 Cardiolite: EF 88, no ischemia/infarct.  . Carotid stenosis    a. dopplers 10/12:  0-39% bilat ICA;  b. 10/2012 U/S: 0-39% bilat, f/u 1 yr (10/2013).  . Chronic bronchitis   . Depression   . Diverticulosis of colon (without mention of hemorrhage)   . GERD (gastroesophageal reflux disease)   . Heart murmur, systolic    2-D echo in December 2011 showed a normal EF with grade 1 diastolic dysfunction, trivial pulmonary regurgitation and mildly elevated PA pressure at 37 mmHg probably secondary to her  COPD.dynamic obstruction-mid cavity obliteration;  b. 02/2012 Echo: EF 60-65%, mild LVH, PASP 92mmHg.  Marland Kitchen History of stomach ulcers 1970's  . Hyperlipidemia   . Hypertension   . Hypothyroidism   . Migraines    a. Next atypical symptoms in the past. Patient was started on Neurontin for possible neuropathic origin of her pain.  . Polymyalgia rheumatica ([place])   . Shortness of breath on exertion    "just related to angina >1 yr ago" (09/23/2013)  . Sinus arrhythmia   . Type II diabetes mellitus ([place])    a. On oral hypoglycemic agents.    Past Surgical History:  Procedure Laterality Date  . CARDIAC CATHETERIZATION N/A 09/26/2015   Procedure: Right/Left Heart Cath and Coronary Angiography;  Surgeon: Larey Dresser, MD;  Location: [place] [facility];  Service: Cardiovascular;  Laterality: N/A;  . CATARACT EXTRACTION W/ INTRAOCULAR LENS  IMPLANT, BILATERAL Bilateral 1990's  . LEFT HEART CATHETERIZATION WITH CORONARY ANGIOGRAM N/A 03/16/2012   Procedure: LEFT HEART CATHETERIZATION WITH CORONARY ANGIOGRAM;  Surgeon: Hillary Bow, MD;  Location: [facility] CATH LAB;  Service: Cardiovascular;  Laterality: N/A;  . RIGHT/LEFT HEART CATH AND CORONARY ANGIOGRAPHY N/A 08/12/2017   Procedure: RIGHT/LEFT HEART CATH AND CORONARY ANGIOGRAPHY;  Surgeon: Martinique, Peter M, MD;  Location: [place] [facility];  Service: Cardiovascular;  Laterality: N/A;  . VAGINAL HYSTERECTOMY  1976    There were no vitals filed for this visit.  Subjective Assessment - 10/05/18 0852    Subjective  I feel like the prolapse does not come out as much and is getting smaller. I am still having fecal leakage. I feel the fecal leakage is getting less. Staying off my feet helps the leakage. Patient reports she is not having rectal pain and is not taking tylenol due ot feeling better.     Patient Stated Goals  reduce the prolapse    Currently in Pain?  No/denies    Multiple Pain Sites  No                    Pelvic Floor Special  Questions - 10/05/18 0001    Biofeedback  resting tone is 2 uv in sidely; contract 10 seconds holding above 15 uv,     Biofeedback sensor type  Surface   rectal   Biofeedback Activity  10 second hold;Quick contraction   sidely       [facility] Adult PT Treatment/Exercise - 10/05/18 0001      Lumbar Exercises: Aerobic   Nustep  6 min, level 2   PT assessing patient              PT Short Term Goals - 09/28/18 0935      PT SHORT TERM GOAL #1   Title  independent with initial HEP with pelvic floor contraction    Time  4    Period  Weeks    Status  Achieved      PT SHORT TERM GOAL #2   Title  abitliy to contract the pelvic floor and lower abdominal to improve continence    Time  4    Period  Weeks    Status  Achieved      PT SHORT TERM GOAL #3   Title  understand correct toilet technique to fully evacuate his bowels    Time  4    Period  Weeks    Status  Achieved      PT SHORT TERM GOAL #4   Title  understand bowel health    Time  4    Period  Weeks    Status  Achieved        PT Long Term Goals - 10/05/18 9323      PT LONG TERM GOAL #1   Title  independent with HEP and understand how to progress herself    Baseline  still learning    Time  8    Status  On-going      PT LONG TERM GOAL #2   Title  improved fecal leakage >/= 50% due to improved strength    Baseline  50% better    Time  8    Period  Weeks    Status  Achieved      PT LONG TERM GOAL #3   Title  rectal prolpase reduce 50% of the time due to imrpoved pelvic floor strength >/= 3/5 holding or 10 seconds    Baseline  60% improved but still has difficulty holding ofr 10 seconds    Time  8    Period  Weeks    Status  On-going      PT LONG TERM GOAL #4   Title  understand ways to manage rectal prolpase     Time  8    Period  Weeks  Status  Achieved            Plan - 10/05/18 0902    Clinical Impression Statement  Patient reports her rectal prolapse is 50% better. Today I did not see  any prolapse when placing the electrodes on the anal area. Patient resting level in sidely is 2 uv. Patient is able to contract for 10 seconds at 20 uv with good control. Patient has difficulty with 2 quick contraction followed by 2 second holds in [place]. Patient reports her fecal leakage is 50% better. Patient is not having rectal pain. Patient will benefit from skilled therapy to improve pelvic floor strength and coordination to reduce fecal leakage and rectal prolapse.     Rehab Potential  Excellent    Clinical Impairments Affecting Rehab Potential  Plymyalgia rhumatica; osteopenia; dierticulosis; hypothyroidism, diabetes; vaginal hysterectomy; cardiac catherization    PT Frequency  1x / week    PT Duration  8 weeks    PT Treatment/Interventions  Biofeedback;Therapeutic exercise;Therapeutic activities;Neuromuscular re-education;Patient/family education;Manual techniques;Passive range of motion;Energy conservation    PT Next Visit Plan  pelvic floor EMG in sidely with 10 second contraction working toward 20 seconds contraction; add quick contraction and hold 5 seconds in sidely    Consulted and Agree with Plan of Care  Patient       Patient will benefit from skilled therapeutic intervention in order to improve the following deficits and impairments:  Increased fascial restricitons, Pain, Decreased coordination, Increased muscle spasms, Decreased activity tolerance, Decreased endurance, Decreased range of motion, Decreased strength, Impaired flexibility  Visit Diagnosis: Muscle weakness (generalized)  Unspecified lack of coordination  Rectal prolapse     Problem List Patient Active Problem List   Diagnosis Date Noted  . Acute right eye pain 07/28/2018  . Rectal prolapse 06/24/2018  . Temporal arteritis ([place]) 04/06/2018  . Chronic cough 10/30/2017  . Right knee pain 08/20/2017  . MDD (major depressive disorder), recurrent, in full remission ([place]) 11/30/2016  . Protein-calorie  malnutrition, severe 11/30/2016  . Elevated alkaline phosphatase level 09/18/2016  . Mixed incontinence urge and stress 05/09/2016  . Hypertensive retinopathy of both eyes, grade 2 04/20/2015  . Lumbar spondylosis 06/16/2014  . Carpal tunnel syndrome 02/18/2014  . Right shoulder pain 02/02/2014  . Osteopenia 01/11/2013  . Routine adult health maintenance 01/11/2013  . Diverticulosis 09/18/2012  . Dyspnea on exertion 03/24/2012  . Fatigue 02/26/2012  . Allergic rhinitis 02/13/2012  . Coronary artery disease 08/29/2011  . HEART MURMUR, SYSTOLIC 48/18/5631  . Enlargement of clavicle 03/30/2008  . Solitary pulmonary nodule 12/15/2006  . Hyperlipidemia 11/28/2006  . Hypothyroidism 09/04/2006  . Type 2 diabetes mellitus, controlled ([place]) 09/04/2006  . Migraine 09/04/2006  . Essential hypertension 09/04/2006  . GERD 09/04/2006    Earlie Counts, PT 10/05/18 9:32 AM   [facility] [address], [place], [place], [address] Phone: [phone]   Fax:  [phone]  Name: MEIAH ZAMUDIO MRN: [id number] Date of Birth: 01/24/1934

## 2018-10-07 ENCOUNTER — Encounter: Payer: Self-pay | Admitting: Internal Medicine

## 2018-10-07 ENCOUNTER — Ambulatory Visit (INDEPENDENT_AMBULATORY_CARE_PROVIDER_SITE_OTHER): Payer: Medicare Other | Admitting: Internal Medicine

## 2018-10-07 ENCOUNTER — Other Ambulatory Visit: Payer: Self-pay | Admitting: Internal Medicine

## 2018-10-07 ENCOUNTER — Other Ambulatory Visit: Payer: Self-pay

## 2018-10-07 VITALS — BP 148/62 | HR 68 | Temp 98.0°F | Ht 64.0 in | Wt 127.9 lb

## 2018-10-07 DIAGNOSIS — E039 Hypothyroidism, unspecified: Secondary | ICD-10-CM

## 2018-10-07 DIAGNOSIS — Z7984 Long term (current) use of oral hypoglycemic drugs: Secondary | ICD-10-CM | POA: Diagnosis not present

## 2018-10-07 DIAGNOSIS — M315 Giant cell arteritis with polymyalgia rheumatica: Secondary | ICD-10-CM | POA: Diagnosis not present

## 2018-10-07 DIAGNOSIS — R5382 Chronic fatigue, unspecified: Secondary | ICD-10-CM

## 2018-10-07 DIAGNOSIS — J302 Other seasonal allergic rhinitis: Secondary | ICD-10-CM

## 2018-10-07 DIAGNOSIS — Z7902 Long term (current) use of antithrombotics/antiplatelets: Secondary | ICD-10-CM

## 2018-10-07 DIAGNOSIS — I10 Essential (primary) hypertension: Secondary | ICD-10-CM

## 2018-10-07 DIAGNOSIS — Z79899 Other long term (current) drug therapy: Secondary | ICD-10-CM

## 2018-10-07 DIAGNOSIS — E119 Type 2 diabetes mellitus without complications: Secondary | ICD-10-CM

## 2018-10-07 DIAGNOSIS — Z7952 Long term (current) use of systemic steroids: Secondary | ICD-10-CM

## 2018-10-07 DIAGNOSIS — M8580 Other specified disorders of bone density and structure, unspecified site: Secondary | ICD-10-CM | POA: Diagnosis not present

## 2018-10-07 DIAGNOSIS — Z79890 Hormone replacement therapy (postmenopausal): Secondary | ICD-10-CM | POA: Diagnosis not present

## 2018-10-07 DIAGNOSIS — M316 Other giant cell arteritis: Secondary | ICD-10-CM

## 2018-10-07 DIAGNOSIS — M8588 Other specified disorders of bone density and structure, other site: Secondary | ICD-10-CM

## 2018-10-07 LAB — GLUCOSE, CAPILLARY: Glucose-Capillary: 125 mg/dL — ABNORMAL HIGH (ref 70–99)

## 2018-10-07 MED ORDER — PREDNISONE 5 MG PO TABS: 5.0000 mg | ORAL_TABLET | Freq: Every day | ORAL | 0 refills | Status: DC

## 2018-10-07 MED ORDER — FLUTICASONE PROPIONATE 50 MCG/ACT NA SUSP: 1.0000 | Freq: Every day | NASAL | 2 refills | Status: DC

## 2018-10-07 MED ORDER — GLIPIZIDE ER 2.5 MG PO TB24: 2.5000 mg | ORAL_TABLET | Freq: Every day | ORAL | 6 refills | Status: DC

## 2018-10-07 MED ORDER — PREDNISONE 1 MG PO TABS: 4.0000 mg | ORAL_TABLET | Freq: Every day | ORAL | 0 refills | Status: DC

## 2018-10-07 NOTE — Assessment & Plan Note (Signed)
Would repeat DEXA scan once she is off steroids to evaluate for any accelerated bone loss.

## 2018-10-07 NOTE — Assessment & Plan Note (Signed)
She is doing very well on weaning from her prednisone.  She has mild headaches only and no return of mylagias or any other symptoms.   Plan Decrease prednisone to 48m for 4 weeks and then 460mfor 4 weeks.  She will call with any return or worsening of symptoms.  She will come back in in 8 weeks and we will do A1C, CRP, ESR and discuss further weaning.

## 2018-10-07 NOTE — Assessment & Plan Note (Signed)
She reports no new symptoms.  Her last TSH 1 year ago was at goal.   Plan TSH at next visit Continue synthroid.

## 2018-10-07 NOTE — Progress Notes (Signed)
   Subjective:    [name], female    DOB: 01/24/1934, 84 y.o.   MRN: [id number]  CC: 4 week follow up for PMR and HTN  HPI   Christine [name] is an 84yo woman with PMH of PMR/GCA on steroids, DM2, HTN who presents for follow up.  She is actively weaning her steroids for PMR.  She was recently changed off of her glimepiride which she was on for a very long time and changed to [place].  She would rather be back on her glimepiride at this time due to concern for side effects from Januvia (she does not have any, but reports reading the side effects and being concerned).  She would like to go back on glimepiride.  I discussed this with her and the recommendation to not be on a long acting sulfonylurea based on her age and we will try glipizide instead.   She notes no myalgias, weakness and only mild headaches with being on the 6mg  of prednisone.  She is interested in continuing to wean her prednisone.  We discussed reasons for her to call back to the clinic if she develops new symptoms as she weans off.    Otherwise, she is doing well.  Her BP at home is averaging around 341 systolic. She did increase her valsartan as requested.   Review of Systems  Constitutional: Negative for activity change.  Eyes: Negative for photophobia and visual disturbance.  Respiratory: Negative for cough and shortness of breath.   Cardiovascular: Negative for chest pain and leg swelling.  Musculoskeletal: Negative for arthralgias and myalgias.  Neurological: Positive for headaches (mild, not new, not as bad as when she started steroids). Negative for dizziness and light-headedness.  Psychiatric/Behavioral: Negative for decreased concentration and dysphoric mood.       Objective:   Physical Exam  Constitutional: She is oriented to person, place, and time. She appears well-developed and well-nourished. No distress.  Cardiovascular: Normal rate, regular rhythm and intact distal pulses.  No murmur  heard. Pulmonary/Chest: Effort normal. No respiratory distress.  Neurological: She is alert and oriented to person, place, and time. She exhibits normal muscle tone.  Gait normal  Psychiatric: She has a normal mood and affect. Her behavior is normal.  Vitals reviewed.   No labs needed today.       Assessment & Plan:  RTC in 8 weeks, sooner if needed.

## 2018-10-07 NOTE — Assessment & Plan Note (Signed)
Her DM is better controlled at last check at 7.4.  She was switched to [facility] for better side effect profile, but she is very concerned about the chance for pancreas issues and she self ceased the medication.  We discussed the dangers of glimepiride and I advised her to try short acting sulfonylurea glipizide to decrease risk of hypoglycemia.  She is hesitant, but accepting of this plan.   Plan Glipizide 2.5 mg with breakfast.  Glucose log reviewed, averaging around 130 with highest around 200 Return in 8 weeks for A1C.

## 2018-10-07 NOTE — Patient Instructions (Addendum)
Christine [name] - -  For your steroid (Prednisone) - -  Please Take 5mg  once daily starting tomorrow for 4 weeks.   Then take 4mg  once daily for 4 weeks.   Please come back to see me in about 8 weeks.   For your diabetes, please STOP Januvia and start taking Glipizide 2.5mg  daily.  Please continue to check your blood sugar as you have been.  Glipizide is a very similar (sister) medication to glimepiride which you were on before, but is safer for people > 34 years old.    Please call with any questions or concerns.   Thank you!  Happy Thanksgiving!  Glipizide tablets What is this medicine? GLIPIZIDE (GLIP i zide) helps to treat type 2 diabetes. Treatment is combined with diet and exercise. The medicine helps your body to use insulin better. This medicine may be used for other purposes; ask your health care provider or pharmacist if you have questions. COMMON BRAND NAME(S): Glucotrol What should I tell my health care provider before I take this medicine? They need to know if you have any of these conditions: -diabetic ketoacidosis -glucose-6-phosphate dehydrogenase deficiency -heart disease -kidney disease -liver disease -porphyria -severe infection or injury -thyroid disease -an unusual or allergic reaction to glipizide, sulfa drugs, other medicines, foods, dyes, or preservatives -pregnant or trying to get pregnant -breast-feeding How should I use this medicine? Take this medicine by mouth. Swallow with a drink of water. Do not take with food. Take it 30 minutes before a meal. Follow the directions on the prescription label. If you take this medicine once a day, take it 30 minutes before breakfast. Take your doses at the same time each day. Do not take more often than directed. Talk to your pediatrician regarding the use of this medicine in children. Special care may be needed. Elderly patients over 64 years old may have a stronger reaction and need a smaller dose. Overdosage: If you  think you have taken too much of this medicine contact a poison control center or emergency room at once. NOTE: This medicine is only for you. Do not share this medicine with others. What if I miss a dose? If you miss a dose, take it as soon as you can. If it is almost time for your next dose, take only that dose. Do not take double or extra doses. What may interact with this medicine? -bosentan -chloramphenicol -cisapride -clarithromycin -medicines for fungal or yeast infections -metoclopramide -probenecid -warfarin Many medications may cause an increase or decrease in blood sugar, these include: -alcohol containing beverages -aspirin and aspirin-like drugs -chloramphenicol -chromium -diuretics -female hormones, like estrogens or progestins and birth control pills -heart medicines -isoniazid -female hormones or anabolic steroids -medicines for weight loss -medicines for allergies, asthma, cold, or cough -medicines for mental problems -medicines called MAO Inhibitors like Nardil, Parnate, Marplan, Eldepryl -niacin -NSAIDs, medicines for pain and inflammation, like ibuprofen or naproxen -pentamidine -phenytoin -probenecid -quinolone antibiotics like ciprofloxacin, levofloxacin, ofloxacin -some herbal dietary supplements -steroid medicines like prednisone or cortisone -thyroid medicine This list may not describe all possible interactions. Give your health care provider a list of all the medicines, herbs, non-prescription drugs, or dietary supplements you use. Also tell them if you smoke, drink alcohol, or use illegal drugs. Some items may interact with your medicine. What should I watch for while using this medicine? Visit your doctor or health care professional for regular checks on your progress. A test called the HbA1C (A1C) will be monitored. This is  a simple blood test. It measures your blood sugar control over the last 2 to 3 months. You will receive this test every 3 to 6  months. Learn how to check your blood sugar. Learn the symptoms of low and high blood sugar and how to manage them. Always carry a quick-source of sugar with you in case you have symptoms of low blood sugar. Examples include hard sugar candy or glucose tablets. Make sure others know that you can choke if you eat or drink when you develop serious symptoms of low blood sugar, such as seizures or unconsciousness. They must get medical help at once. Tell your doctor or health care professional if you have high blood sugar. You might need to change the dose of your medicine. If you are sick or exercising more than usual, you might need to change the dose of your medicine. Do not skip meals. Ask your doctor or health care professional if you should avoid alcohol. Many nonprescription cough and cold products contain sugar or alcohol. These can affect blood sugar. This medicine can make you more sensitive to the sun. Keep out of the sun. If you cannot avoid being in the sun, wear protective clothing and use sunscreen. Do not use sun lamps or tanning beds/booths. Wear a medical ID bracelet or chain, and carry a card that describes your disease and details of your medicine and dosage times. What side effects may I notice from receiving this medicine? Side effects that you should report to your doctor or health care professional as soon as possible: -allergic reactions like skin rash, itching or hives, swelling of the face, lips, or tongue -breathing problems -dark urine -fever, chills, sore throat -signs and symptoms of low blood sugar such as feeling anxious, confusion, dizziness, increased hunger, unusually weak or tired, sweating, shakiness, cold, irritable, headache, blurred vision, fast heartbeat, loss of consciousness -unusual bleeding or bruising -yellowing of the eyes or skin Side effects that usually do not require medical attention (report to your doctor or health care professional if they continue  or are bothersome): -diarrhea -dizziness -headache -heartburn -nausea -stomach gas This list may not describe all possible side effects. Call your doctor for medical advice about side effects. You may report side effects to FDA at 1-800-FDA-1088. Where should I keep my medicine? Keep out of the reach of children. Store at room temperature below 30 degrees C (86 degrees F). Throw away any unused medicine after the expiration date. NOTE: This sheet is a summary. It may not cover all possible information. If you have questions about this medicine, talk to your doctor, pharmacist, or health care provider.  2018 Elsevier/Gold Standard (2013-02-17 14:42:46)

## 2018-10-07 NOTE — Assessment & Plan Note (Signed)
Improved on steroids and no issues today.

## 2018-10-07 NOTE — Assessment & Plan Note (Signed)
BP in clinic was a little elevated, but at home has been well controlled.  No dizziness noted.  She is taking valsartan without issue.   Plan Continue Valsartan 240mg  daily

## 2018-10-12 ENCOUNTER — Other Ambulatory Visit: Payer: Self-pay | Admitting: *Deleted

## 2018-10-12 ENCOUNTER — Telehealth: Payer: Self-pay

## 2018-10-12 DIAGNOSIS — E119 Type 2 diabetes mellitus without complications: Secondary | ICD-10-CM

## 2018-10-12 MED ORDER — ONETOUCH DELICA LANCETS FINE MISC: 1 refills | Status: DC

## 2018-10-12 NOTE — Telephone Encounter (Signed)
Requesting to speak with a nurse about meds. Please call back.  

## 2018-10-12 NOTE — Telephone Encounter (Signed)
Pt calls and states she awoke today with some heart pain and wonders if the [place] she denies short of breath, N&V, weakness she does state she had a little dizziness. She states ive been reading about it and it might be that diabetes medicine. Please advise

## 2018-10-12 NOTE — Telephone Encounter (Signed)
I reviewed Dr Doristine Section note from 5 days ago, she noted that Christine [name] had concerns about Januvia and had self stopped the medication and Dr Daryll Drown agreed with plan to go back to glipizde.  It is fine with me for her to hold off Januvia although it is not likely to cause chest pain, I would advise if she is having chest pain to be evaluated in the Emergency department.

## 2018-10-12 NOTE — Telephone Encounter (Signed)
Called pt back, had already ask her to go to ED if chest pain came back or short of breath, N&V, h/a, weakness, dizziness. Reinforced. States she is going to restart glipizide and she just knows "that new medicine is not good for me"

## 2018-10-20 ENCOUNTER — Ambulatory Visit: Payer: Medicare Other | Attending: Gastroenterology | Admitting: Physical Therapy

## 2018-10-20 ENCOUNTER — Encounter: Payer: Self-pay | Admitting: Physical Therapy

## 2018-10-20 DIAGNOSIS — M6281 Muscle weakness (generalized): Secondary | ICD-10-CM | POA: Insufficient documentation

## 2018-10-20 DIAGNOSIS — R279 Unspecified lack of coordination: Secondary | ICD-10-CM | POA: Diagnosis not present

## 2018-10-20 DIAGNOSIS — K623 Rectal prolapse: Secondary | ICD-10-CM | POA: Diagnosis not present

## 2018-10-20 NOTE — Patient Instructions (Addendum)
Bracing With Bridging (Hook-Lying)    With neutral spine, tighten pelvic floor and abdominals and hold. Lift bottom. Repeat _15__ times. Do _1__ times a day.   Copyright  VHI. All rights reserved.   Bracing With Knee Fallout (Hook-Lying)    With neutral spine, tighten pelvic floor and abdominals and hold. Alternating legs, drop knee out to side. Keep opposite hip still. Repeat 15__ times. Do _1__ times a day.   Copyright  VHI. All rights reserved.  Bracing With Leg March (Hook-Lying)    With neutral spine, tighten pelvic floor and abdominals and hold. Alternating legs, lift foot ___ inches and return to floor. Repeat _15__ times. Do __1_ times a day.   Copyright  VHI. All rights reserved.  [facility] [address], [place], [place] [address] Phone # [phone] Fax [phone]

## 2018-10-20 NOTE — Therapy (Signed)
[facility] [address] [place], [place], [address] Phone: [phone]   Fax:  [phone]  Physical Therapy Treatment  Patient Details  Name: Christine [name] MRN: [id number] Date of Birth: 01/24/1934 Referring Provider (PT): Dr. Stormy Card   Encounter Date: 10/20/2018  PT End of Session - 10/20/18 0859    Visit Number  8    Date for PT Re-Evaluation  12/24/18    Authorization Type  Medicare/Medicaid    PT Start Time  [id number]    PT Stop Time  0930    PT Time Calculation (min)  39 min    Activity Tolerance  Patient tolerated treatment well    Behavior During Therapy  [facility] for tasks assessed/performed       Past Medical History:  Diagnosis Date  . Anemia   . Anginal pain ([place])   . Arthritis    "back, arms, hips; hands" (11/30/2015)  . Benign hypertensive heart and kidney disease with diastolic CHF, NYHA class II and CKD stage III ([place])   . Blood transfusion 1972   "after daughter born, attempted to give me blood; couldn't give it cause my blood was cold" (09/23/2013)  . CAD (coronary artery disease)    a. LHC 08/23/11: dLM 40-50%, oRI 40%, oCFX 40%, oD1 70% (small and not amenable to PCI).  LM lesion did not appear to be flow limiting.  Medical rx was recommended;  b. Echo 08/23/11: mild LVH, EF 60-65%, grade 1 diast dysfxn, mild BAE, PASP 24;  c. 02/2012  Cath: LM 50d, LAD 50p, D1 70ost, RI 70p, RCA ok->Med Rx;  d. 01/2013 Cardiolite: EF 88, no ischemia/infarct.  . Carotid stenosis    a. dopplers 10/12:  0-39% bilat ICA;  b. 10/2012 U/S: 0-39% bilat, f/u 1 yr (10/2013).  . Chronic bronchitis   . Depression   . Diverticulosis of colon (without mention of hemorrhage)   . GERD (gastroesophageal reflux disease)   . Heart murmur, systolic    2-D echo in December 2011 showed a normal EF with grade 1 diastolic dysfunction, trivial pulmonary regurgitation and mildly elevated PA pressure at 37 mmHg probably secondary to her  COPD.dynamic obstruction-mid cavity obliteration;  b. 02/2012 Echo: EF 60-65%, mild LVH, PASP 82mmHg.  Marland Kitchen History of stomach ulcers 1970's  . Hyperlipidemia   . Hypertension   . Hypothyroidism   . Migraines    a. Next atypical symptoms in the past. Patient was started on Neurontin for possible neuropathic origin of her pain.  . Polymyalgia rheumatica ([place])   . Shortness of breath on exertion    "just related to angina >1 yr ago" (09/23/2013)  . Sinus arrhythmia   . Type II diabetes mellitus ([place])    a. On oral hypoglycemic agents.    Past Surgical History:  Procedure Laterality Date  . CARDIAC CATHETERIZATION N/A 09/26/2015   Procedure: Right/Left Heart Cath and Coronary Angiography;  Surgeon: Larey Dresser, MD;  Location: [facility];  Service: Cardiovascular;  Laterality: N/A;  . CATARACT EXTRACTION W/ INTRAOCULAR LENS  IMPLANT, BILATERAL Bilateral 1990's  . LEFT HEART CATHETERIZATION WITH CORONARY ANGIOGRAM N/A 03/16/2012   Procedure: LEFT HEART CATHETERIZATION WITH CORONARY ANGIOGRAM;  Surgeon: Hillary Bow, MD;  Location: [facility];  Service: Cardiovascular;  Laterality: N/A;  . RIGHT/LEFT HEART CATH AND CORONARY ANGIOGRAPHY N/A 08/12/2017   Procedure: RIGHT/LEFT HEART CATH AND CORONARY ANGIOGRAPHY;  Surgeon: Martinique, Peter M, MD;  Location: [facility];  Service: Cardiovascular;  Laterality: N/A;  . VAGINAL HYSTERECTOMY  1976    There were no vitals filed for this visit.  Subjective Assessment - 10/20/18 0858    Subjective  My legs bother me. No rectal pain since Saturday when I went to the bathroom. I am having alot more leakage.     Patient Stated Goals  reduce the prolapse    Currently in Pain?  No/denies         [facility] PT Assessment - 10/20/18 0001      Assessment   Medical Diagnosis  K62.2 Anal prolpase    Referring Provider (PT)  Dr. Remo Lipps Armruster    Onset Date/Surgical Date  05/18/18    Prior Therapy  2 sessions      Precautions    Precautions  None      Restrictions   Weight Bearing Restrictions  No      Home Environment   Living Environment  Private residence    Living Arrangements  Children    Available Help at Discharge  Family;Available PRN/intermittently    Type of Home  House    Home Access  Stairs to enter    Entrance Stairs-Number of Steps  2    Entrance Stairs-Rails  None    Home Layout  Two level;Bed/bath upstairs      Prior Function   Level of Independence  Independent    Vocation  Retired      Associate Professor   Overall Cognitive Status  Within Functional Limits for tasks assessed      Posture/Postural Control   Posture/Postural Control  No significant limitations      ROM / Strength   AROM / PROM / Strength  AROM;PROM;Strength      AROM   Overall AROM   Within functional limits for tasks performed      PROM   Right Hip External Rotation   45    Left Hip External Rotation   50      Strength   Right Hip Extension  4/5    Right Hip External Rotation   4+/5    Right Hip ABduction  5/5    Left Hip Extension  4/5    Left Hip External Rotation  4+/5    Left Hip ABduction  5/5                Pelvic Floor Special Questions - 10/20/18 0001    Strength  weak squeeze, no lift        [facility] Adult PT Treatment/Exercise - 10/20/18 0001      Exercises   Exercises  Lumbar      Lumbar Exercises: Aerobic   Nustep  9 min, level 2   PT assessing patient     Lumbar Exercises: Supine   Clam  20 reps;1 second   hips on pillow; pelvic floor contraction   Bent Knee Raise  20 reps;1 second   hips on pillow; pelvic floor contraction   Bridge  15 reps;5 seconds   pelvic floor contraction   Isometric Hip Flexion  10 reps;5 seconds   hips on pillow; pelvic floor contraction              PT Short Term Goals - 09/28/18 0935      PT SHORT TERM GOAL #1   Title  independent with initial HEP with pelvic floor contraction    Time  4    Period  Weeks    Status  Achieved  PT  SHORT TERM GOAL #2   Title  abitliy to contract the pelvic floor and lower abdominal to improve continence    Time  4    Period  Weeks    Status  Achieved      PT SHORT TERM GOAL #3   Title  understand correct toilet technique to fully evacuate his bowels    Time  4    Period  Weeks    Status  Achieved      PT SHORT TERM GOAL #4   Title  understand bowel health    Time  4    Period  Weeks    Status  Achieved        PT Long Term Goals - 10/20/18 7371      PT LONG TERM GOAL #1   Title  independent with HEP and understand how to progress herself    Baseline  still learning    Time  8    Period  Weeks    Status  On-going      PT LONG TERM GOAL #2   Title  improved fecal leakage >/= 50% due to improved strength    Baseline  60% better      PT LONG TERM GOAL #3   Title  rectal prolpase reduce 50% of the time due to imrpoved pelvic floor strength >/= 3/5 holding or 10 seconds    Baseline  60% improved but still has difficulty holding for 10 seconds    Time  8    Period  Weeks    Status  On-going      PT LONG TERM GOAL #4   Title  understand ways to manage rectal prolpase     Time  8    Period  Weeks    Status  Achieved            Plan - 10/20/18 0626    Clinical Impression Statement  Patient is able to contract the pelvic floor for 6 seconds but needs verbal cues to breath. Patient has increased in bilateral hip strength except for extension. Patient reports her rectal prlapase is 60% better and is different daily. Patient reports she had increased leakage last weekend and this morning after I had a intestinal bug. Patient reports the leakage can be significant at times. Patient reports her rectal pain is 70% better. Patient has is able to contract for 10 seconds at 20 uv with good control in sidely. Patietn has idfficulty with 2 quick contractions followed by 2 second holds in sidely. Patient will benefit from skilled therapy to improve pelvic floor strength to reduce  fecal leakage and rectal prolapse.     Rehab Potential  Excellent    Clinical Impairments Affecting Rehab Potential  Plymyalgia rhumatica; osteopenia; dierticulosis; hypothyroidism, diabetes; vaginal hysterectomy; cardiac catherization    PT Frequency  1x / week    PT Duration  8 weeks    PT Treatment/Interventions  Biofeedback;Therapeutic exercise;Therapeutic activities;Neuromuscular re-education;Patient/family education;Manual techniques;Passive range of motion;Energy conservation    PT Next Visit Plan  pelvic floor EMG in sidely with 10 second contraction working toward 20 seconds contraction; add quick contraction and hold 5 seconds in sidely    Recommended Other Services  sent MD renewal note 10/20/2018    Consulted and Agree with Plan of Care  Patient       Patient will benefit from skilled therapeutic intervention in order to improve the following deficits and impairments:  Increased fascial restricitons, Pain,  Decreased coordination, Increased muscle spasms, Decreased activity tolerance, Decreased endurance, Decreased range of motion, Decreased strength, Impaired flexibility  Visit Diagnosis: Muscle weakness (generalized) - Plan: PT plan of care cert/re-cert  Unspecified lack of coordination - Plan: PT plan of care cert/re-cert  Rectal prolapse - Plan: PT plan of care cert/re-cert     Problem List Patient Active Problem List   Diagnosis Date Noted  . Rectal prolapse 06/24/2018  . Temporal arteritis ([place]) 04/06/2018  . Chronic cough 10/30/2017  . Right knee pain 08/20/2017  . MDD (major depressive disorder), recurrent, in full remission ([place]) 11/30/2016  . Protein-calorie malnutrition, severe 11/30/2016  . Elevated alkaline phosphatase level 09/18/2016  . Mixed incontinence urge and stress 05/09/2016  . Hypertensive retinopathy of both eyes, grade 2 04/20/2015  . Lumbar spondylosis 06/16/2014  . Carpal tunnel syndrome 02/18/2014  . Right shoulder pain 02/02/2014  .  Osteopenia 01/11/2013  . Routine adult health maintenance 01/11/2013  . Diverticulosis 09/18/2012  . Dyspnea on exertion 03/24/2012  . Fatigue 02/26/2012  . Allergic rhinitis 02/13/2012  . Coronary artery disease 08/29/2011  . HEART MURMUR, SYSTOLIC 35/70/1779  . Enlargement of clavicle 03/30/2008  . Solitary pulmonary nodule 12/15/2006  . Hyperlipidemia 11/28/2006  . Hypothyroidism 09/04/2006  . Type 2 diabetes mellitus, controlled ([place]) 09/04/2006  . Migraine 09/04/2006  . Essential hypertension 09/04/2006  . GERD 09/04/2006    Earlie Counts, PT 10/20/18 10:43 AM   [facility] [address], [place], [place], [address] Phone: [phone]   Fax:  [phone]  Name: Christine [name] MRN: [id number] Date of Birth: 01/24/1934

## 2018-10-22 ENCOUNTER — Telehealth: Payer: Self-pay | Admitting: *Deleted

## 2018-10-22 ENCOUNTER — Telehealth: Payer: Self-pay | Admitting: Internal Medicine

## 2018-10-22 NOTE — Telephone Encounter (Signed)
Started new encounter

## 2018-10-22 NOTE — Telephone Encounter (Signed)
Pt is wanting a callback from a nurse [phone]

## 2018-10-22 NOTE — Telephone Encounter (Signed)
rtc to pt, she states she wants the glimepiride started back as she is sure the glipizide caused her chest pain and she has stopped it. She states she took her glimepiride for years and it did not

## 2018-10-22 NOTE — Telephone Encounter (Signed)
Was interrupted during note, pt states she had no problem with glimepiride and wants to take that, she is quite adamant and states she is sorry but she cannot take the other 2 meds dr Daryll Drown talked to her about because she knows they are not good for her

## 2018-10-23 NOTE — Telephone Encounter (Signed)
I am contemplating next steps.  Will call patient next week.  Thank you for the information.

## 2018-10-26 ENCOUNTER — Ambulatory Visit: Payer: Medicare Other | Admitting: Physical Therapy

## 2018-10-26 ENCOUNTER — Encounter: Payer: Self-pay | Admitting: Physical Therapy

## 2018-10-26 DIAGNOSIS — M6281 Muscle weakness (generalized): Secondary | ICD-10-CM | POA: Diagnosis not present

## 2018-10-26 DIAGNOSIS — R279 Unspecified lack of coordination: Secondary | ICD-10-CM | POA: Diagnosis not present

## 2018-10-26 DIAGNOSIS — K623 Rectal prolapse: Secondary | ICD-10-CM

## 2018-10-26 NOTE — Patient Instructions (Addendum)
Bracing With Bridging (Hook-Lying)    With neutral spine, tighten pelvic floor and abdominals and hold. Lift bottom. Repeat _15__ times. Do _1__ times a day.   Copyright  VHI. All rights reserved.   Bracing With Knee Fallout (Hook-Lying)    With neutral spine, tighten pelvic floor and abdominals and hold. Alternating legs, drop knee out to side. Keep opposite hip still. Repeat 15__ times. Do _1__ times a day.   Copyright  VHI. All rights reserved.  Bracing With Leg March (Hook-Lying)    With neutral spine, tighten pelvic floor and abdominals and hold. Alternating legs, lift foot ___ inches and return to floor. Repeat _15__ times. Do __1_ times a day.   Copyright  VHI. All rights reserved.   Slow Contraction: Gravity Eliminated (Side-Lying)    Lie on left side, hips and knees slightly bent. Slowly squeeze pelvic floor for _15__ seconds. Rest for _5__ seconds. Repeat _10__ times. Do _2__ times a day.   Copyright  VHI. All rights reserved.   Quick Contraction: Gravity Eliminated (Side-Lying)    Lie on left side, hips and knees slightly bent. Quickly squeeze then fully relax pelvic floor. Perform __1_ sets of __5_. Rest for _1__ seconds between sets. Do _2__ times a day.   Copyright  VHI. All rights reserved.  External Rotation: Hip - Knees Apart With Pelvic Floor (Hook-Lying)    Lie with hips and knees bent, band tied just above knees. Squeeze pelvic floor while pulling knees apart. Hold for _1__ seconds. Rest for __1_ seconds. Repeat _10__ times. Do _1__ times a day.   Copyright  VHI. All rights reserved.  [facility] [address] [place], [place] [address] Phone # [phone] Fax [phone]

## 2018-10-26 NOTE — Therapy (Addendum)
[facility] [address], [place], [place], [address] Phone: [phone]   Fax:  [phone]  Physical Therapy Treatment  Patient Details  Name: Christine [name] MRN: [id number] Date of Birth: 01/24/1934 Referring Provider (PT): Dr. Stormy Card   Encounter Date: 10/26/2018  PT End of Session - 10/26/18 0857    Visit Number  9    Date for PT Re-Evaluation  12/24/18    Authorization Type  Medicare/Medicaid    PT Start Time  [phone]   had wrong treatment time so started late   PT Stop Time  0930    PT Time Calculation (min)  35 min    Activity Tolerance  Patient tolerated treatment well    Behavior During Therapy  [facility] for tasks assessed/performed       Past Medical History:  Diagnosis Date  . Anemia   . Anginal pain ([place])   . Arthritis    "back, arms, hips; hands" (11/30/2015)  . Benign hypertensive heart and kidney disease with diastolic CHF, NYHA class II and CKD stage III ([place])   . Blood transfusion 1972   "after daughter born, attempted to give me blood; couldn't give it cause my blood was cold" (09/23/2013)  . CAD (coronary artery disease)    a. LHC 08/23/11: dLM 40-50%, oRI 40%, oCFX 40%, oD1 70% (small and not amenable to PCI).  LM lesion did not appear to be flow limiting.  Medical rx was recommended;  b. Echo 08/23/11: mild LVH, EF 60-65%, grade 1 diast dysfxn, mild BAE, PASP 24;  c. 02/2012  Cath: LM 50d, LAD 50p, D1 70ost, RI 70p, RCA ok->Med Rx;  d. 01/2013 Cardiolite: EF 88, no ischemia/infarct.  . Carotid stenosis    a. dopplers 10/12:  0-39% bilat ICA;  b. 10/2012 U/S: 0-39% bilat, f/u 1 yr (10/2013).  . Chronic bronchitis   . Depression   . Diverticulosis of colon (without mention of hemorrhage)   . GERD (gastroesophageal reflux disease)   . Heart murmur, systolic    2-D echo in December 2011 showed a normal EF with grade 1 diastolic dysfunction, trivial pulmonary regurgitation and mildly elevated PA  pressure at 37 mmHg probably secondary to her COPD.dynamic obstruction-mid cavity obliteration;  b. 02/2012 Echo: EF 60-65%, mild LVH, PASP 35mHg.  .Marland KitchenHistory of stomach ulcers 1970's  . Hyperlipidemia   . Hypertension   . Hypothyroidism   . Migraines    a. Next atypical symptoms in the past. Patient was started on Neurontin for possible neuropathic origin of her pain.  . Pol[place]algia rheumatica ([place]   . Shortness of breath on exertion    "just related to angina >1 yr ago" (09/23/2013)  . Sinus arrhythmia   . Type II diabetes mellitus ([place]    a. On oral hypoglycemic agents.    Past Surgical History:  Procedure Laterality Date  . CARDIAC CATHETERIZATION N/A 09/26/2015   Procedure: Right/Left Heart Cath and Coronary Angiography;  Surgeon: DLarey Dresser MD;  [facility];  Service: Cardiovascular;  Laterality: N/A;  . CATARACT EXTRACTION W/ INTRAOCULAR LENS  IMPLANT, BILATERAL Bilateral 1990's  . LEFT HEART CATHETERIZATION WITH CORONARY ANGIOGRAM N/A 03/16/2012   Procedure: LEFT HEART CATHETERIZATION WITH CORONARY ANGIOGRAM;  Surgeon: THillary Bow MD;  [facility];  Service: Cardiovascular;  Laterality: N/A;  . RIGHT/LEFT HEART CATH AND CORONARY ANGIOGRAPHY N/A 08/12/2017   Procedure: RIGHT/LEFT HEART CATH AND CORONARY ANGIOGRAPHY;  Surgeon: JMartinique Peter M,  MD;  Location: [place] [facility];  Service: Cardiovascular;  Laterality: N/A;  . VAGINAL HYSTERECTOMY  1976    There were no vitals filed for this visit.  Subjective Assessment - 10/26/18 0858    Subjective  I have not had pain. I had to go to the bathroom alot today. My prolpase has been out 3 times. It has been better. Some days are better than others for leakage. I use a pad  daily. I do not take stool softners anymore just use my diet.     Patient Stated Goals  reduce the prolapse    Currently in Pain?  No/denies                       [facility] Adult PT Treatment/Exercise - 10/26/18  0001      Lumbar Exercises: Aerobic   Nustep  6 min, level 2   PT assessing patient     Lumbar Exercises: Supine   Clam  20 reps;1 second   hips on pillow; pelvic floor contraction   Bent Knee Raise  20 reps;1 second   hips on pillow; pelvic floor contraction   Bridge  15 reps;5 seconds   pelvic floor contraction   Isometric Hip Flexion  10 reps;5 seconds   hips on pillow; pelvic floor contraction   Other Supine Lumbar Exercises  pelvic floor contraction 15 seconds 10 times in sidely             PT Education - 10/26/18 0920    Education Details  pelvic floor contraction in sidely, supine pelvic floor contraction with abdominal contraction    Person(s) Educated  Patient    Methods  Explanation;Demonstration;Verbal cues;Handout    Comprehension  Returned demonstration;Verbalized understanding       PT Short Term Goals - 09/28/18 0935      PT SHORT TERM GOAL #1   Title  independent with initial HEP with pelvic floor contraction    Time  4    Period  Weeks    Status  Achieved      PT SHORT TERM GOAL #2   Title  abitliy to contract the pelvic floor and lower abdominal to improve continence    Time  4    Period  Weeks    Status  Achieved      PT SHORT TERM GOAL #3   Title  understand correct toilet technique to fully evacuate his bowels    Time  4    Period  Weeks    Status  Achieved      PT SHORT TERM GOAL #4   Title  understand bowel health    Time  4    Period  Weeks    Status  Achieved        PT Long Term Goals - 10/26/18 6629      PT LONG TERM GOAL #1   Title  independent with HEP and understand how to progress herself    Baseline  still learning    Time  8    Period  Weeks    Status  On-going      PT LONG TERM GOAL #2   Title  improved fecal leakage >/= 50% due to improved strength    Baseline  60% better    Time  8    Period  Weeks    Status  Achieved      PT LONG TERM GOAL #3   Title  rectal  prolpase reduce 50% of the time due to  imrpoved pelvic floor strength >/= 3/5 holding or 10 seconds    Baseline  60% improved but still has difficulty holding for 10 seconds    Time  8    Period  Weeks    Status  On-going      PT LONG TERM GOAL #4   Title  understand ways to manage rectal prolpase     Time  8    Period  Weeks    Status  Achieved            Plan - 10/26/18 0909    Clinical Impression Statement  Patient is not havign pain in rectum or legs. Patient has to change the tissue she uses in the rectal area everytime sh goes to the bathroom due to leakage. Patient had to place the rectal prolapse back in 3 times since last visit. Patient is not taking stool softners due to diet and exercise. Patient will benefit from skilled therapy to imporve pelvic floor strength to reduce fecal leakage and rectal prolapse.     Rehab Potential  Excellent    Clinical Impairments Affecting Rehab Potential  Plymyalgia rhumatica; osteopenia; dierticulosis; hypothyroidism, diabetes; vaginal hysterectomy; cardiac catherization    PT Frequency  1x / week    PT Duration  8 weeks    PT Treatment/Interventions  Biofeedback;Therapeutic exercise;Therapeutic activities;Neuromuscular re-education;Patient/family education;Manual techniques;Passive range of motion;Energy conservation    PT Next Visit Plan  write 10th visit progress note; Continues  with exercise for pelvic floor in supine and sidely    Recommended Other Services  MD signed renewal and initial note    Consulted and Agree with Plan of Care  Patient       Patient will benefit from skilled therapeutic intervention in order to improve the following deficits and impairments:  Increased fascial restricitons, Pain, Decreased coordination, Increased muscle spasms, Decreased activity tolerance, Decreased endurance, Decreased range of motion, Decreased strength, Impaired flexibility  Visit Diagnosis: Muscle weakness (generalized)  Unspecified lack of coordination  Rectal  prolapse     Problem List Patient Active Problem List   Diagnosis Date Noted  . Rectal prolapse 06/24/2018  . Temporal arteritis ([place]) 04/06/2018  . Chronic cough 10/30/2017  . Right knee pain 08/20/2017  . MDD (major depressive disorder), recurrent, in full remission ([place]) 11/30/2016  . Protein-calorie malnutrition, severe 11/30/2016  . Elevated alkaline phosphatase level 09/18/2016  . Mixed incontinence urge and stress 05/09/2016  . Hypertensive retinopathy of both eyes, grade 2 04/20/2015  . Lumbar spondylosis 06/16/2014  . Carpal tunnel syndrome 02/18/2014  . Right shoulder pain 02/02/2014  . Osteopenia 01/11/2013  . Routine adult health maintenance 01/11/2013  . Diverticulosis 09/18/2012  . Dyspnea on exertion 03/24/2012  . Fatigue 02/26/2012  . Allergic rhinitis 02/13/2012  . Coronary artery disease 08/29/2011  . HEART MURMUR, SYSTOLIC 19/62/2297  . Enlargement of clavicle 03/30/2008  . Solitary pulmonary nodule 12/15/2006  . Hyperlipidemia 11/28/2006  . Hypothyroidism 09/04/2006  . Type 2 diabetes mellitus, controlled ([place]) 09/04/2006  . Migraine 09/04/2006  . Essential hypertension 09/04/2006  . GERD 09/04/2006    Earlie Counts, PT 10/26/18 9:29 AM   [facility] [address], [place], [place], [address] Phone: [phone]   Fax:  [phone]  Name: Christine [name] MRN: [id number] Date of Birth: 01/24/1934  PHYSICAL THERAPY DISCHARGE SUMMARY  Visits from Start of Care: 9  Current functional level related to goals /  functional outcomes: See above. Patient called today to cancel her appointment due to taking care of other health issues. Patient decided to be discharged so she can focus on her other health issues.    Remaining deficits: See above.    Education / Equipment: HEP Plan: Patient agrees to discharge.  Patient goals were not met. Patient is being discharged due to the patient's  request. Thank you for the referral. Earlie Counts, PT 12/22/18 8:35 AM   ?????

## 2018-10-27 MED ORDER — GLIMEPIRIDE 1 MG PO TABS: ORAL_TABLET | ORAL | 0 refills | Status: DC

## 2018-10-27 NOTE — Telephone Encounter (Signed)
Pt has called today and states her CBG has been up over 200 several times lately and she is worried, she states she had 3 of her old med tablets- glimepiride left and she has taken those and they made a big difference and she feels better when she takes it. Triage told her dr Daryll Drown would be in touch with her but was trying to make a plan that pt and doctor could agree on.

## 2018-10-27 NOTE — Telephone Encounter (Signed)
I have an appointment with her in January and we can discuss further at that time.   For now, I have sent in a refill for glimepiride 1mg  tablets to take daily before breakfast.   Please call patient and ask her to HOLD this medication if her AM blood sugar is < 200.  She is at high risk for low blood sugars with this medication.    Further discussion in January.    Thank you  Gilles Chiquito, MD

## 2018-10-28 ENCOUNTER — Telehealth: Payer: Self-pay | Admitting: Physical Therapy

## 2018-10-28 ENCOUNTER — Ambulatory Visit: Payer: Medicare Other | Admitting: Physical Therapy

## 2018-10-28 ENCOUNTER — Telehealth: Payer: Self-pay | Admitting: *Deleted

## 2018-10-28 NOTE — Telephone Encounter (Signed)
Called patient about her 8:45 AM to let her know she already had her visit this week.  Earlie Counts, PT @12 /09/2018@ 9:01 AM

## 2018-10-28 NOTE — Telephone Encounter (Signed)
Called pt - no answer; left message to call the office about her medication.

## 2018-10-28 NOTE — Telephone Encounter (Signed)
Pt rtc from this am, gave her dr [employer] instructions, we both repeated 3 times and she states she understands and to tell everyone Merry Christmas

## 2018-11-02 ENCOUNTER — Encounter: Payer: Self-pay | Admitting: Physical Therapy

## 2018-11-03 ENCOUNTER — Other Ambulatory Visit: Payer: Self-pay

## 2018-11-03 DIAGNOSIS — E119 Type 2 diabetes mellitus without complications: Secondary | ICD-10-CM

## 2018-11-03 MED ORDER — ONETOUCH ULTRA BLUE VI STRP: 1.0000 | ORAL_STRIP | Freq: Two times a day (BID) | 1 refills | Status: DC

## 2018-11-03 NOTE — Telephone Encounter (Signed)
Requesting test strip to be filled @  [facility] [phone] [address], [place] AT [facility] [phone] (Phone) [phone] (Fax)

## 2018-11-16 ENCOUNTER — Ambulatory Visit: Payer: Medicare Other | Admitting: Physical Therapy

## 2018-11-23 ENCOUNTER — Ambulatory Visit: Payer: Medicare Other | Admitting: Physical Therapy

## 2018-12-02 ENCOUNTER — Other Ambulatory Visit: Payer: Self-pay

## 2018-12-02 ENCOUNTER — Ambulatory Visit (INDEPENDENT_AMBULATORY_CARE_PROVIDER_SITE_OTHER): Payer: Medicare Other | Admitting: Internal Medicine

## 2018-12-02 ENCOUNTER — Encounter: Payer: Self-pay | Admitting: Internal Medicine

## 2018-12-02 VITALS — BP 151/65 | HR 94 | Temp 98.2°F | Ht 64.0 in | Wt 125.1 lb

## 2018-12-02 DIAGNOSIS — Z7952 Long term (current) use of systemic steroids: Secondary | ICD-10-CM | POA: Diagnosis not present

## 2018-12-02 DIAGNOSIS — E119 Type 2 diabetes mellitus without complications: Secondary | ICD-10-CM | POA: Diagnosis not present

## 2018-12-02 DIAGNOSIS — Z79890 Hormone replacement therapy (postmenopausal): Secondary | ICD-10-CM | POA: Diagnosis not present

## 2018-12-02 DIAGNOSIS — Z7984 Long term (current) use of oral hypoglycemic drugs: Secondary | ICD-10-CM

## 2018-12-02 DIAGNOSIS — Z7902 Long term (current) use of antithrombotics/antiplatelets: Secondary | ICD-10-CM | POA: Diagnosis not present

## 2018-12-02 DIAGNOSIS — Z79899 Other long term (current) drug therapy: Secondary | ICD-10-CM | POA: Diagnosis not present

## 2018-12-02 DIAGNOSIS — I10 Essential (primary) hypertension: Secondary | ICD-10-CM

## 2018-12-02 DIAGNOSIS — M316 Other giant cell arteritis: Secondary | ICD-10-CM | POA: Diagnosis not present

## 2018-12-02 DIAGNOSIS — E039 Hypothyroidism, unspecified: Secondary | ICD-10-CM | POA: Diagnosis not present

## 2018-12-02 LAB — GLUCOSE, CAPILLARY: Glucose-Capillary: 128 mg/dL — ABNORMAL HIGH (ref 70–99)

## 2018-12-02 LAB — POCT GLYCOSYLATED HEMOGLOBIN (HGB A1C): HEMOGLOBIN A1C: 8.7 % — AB (ref 4.0–5.6)

## 2018-12-02 MED ORDER — LEVOTHYROXINE SODIUM 25 MCG PO TABS: 25.0000 ug | ORAL_TABLET | Freq: Every day | ORAL | 1 refills | Status: DC

## 2018-12-02 MED ORDER — VALSARTAN 160 MG PO TABS: 320.0000 mg | ORAL_TABLET | Freq: Every day | ORAL | 11 refills | Status: DC

## 2018-12-02 MED ORDER — ONETOUCH ULTRASOFT LANCETS MISC: 12 refills | Status: DC

## 2018-12-02 MED ORDER — GLIMEPIRIDE 1 MG PO TABS: ORAL_TABLET | ORAL | 0 refills | Status: DC

## 2018-12-02 MED ORDER — PREDNISONE 1 MG PO TABS: 4.0000 mg | ORAL_TABLET | Freq: Every day | ORAL | 0 refills | Status: DC

## 2018-12-02 NOTE — Patient Instructions (Addendum)
Christine [name] - -  For your diabetes - Please start taking glimepiride 1mg  daily again.  Please continue to check your blood sugars  For your blood pressure - please increase your valsartan to 2 tablets per day (320mg ).  If you find that you are getting lightheaded or low blood pressures, go back to 1.5 tablets per day.   For your steroid dose - stay on the 4 mg/day for now given your symptoms have started to come back.  We will check a sed rate and CRP to see if you need to go back up on the steroids.   Thank you!  Come back to see me in 4 weeks.

## 2018-12-02 NOTE — Progress Notes (Signed)
   Subjective:    [name], female    DOB: 01/24/1934, 85 y.o.   MRN: [id number]  CC: 8 week follow up for DM and temporal arteritis  HPI   Christine [name] is an 85 year old woman with PMH of DM2, HTN, hypothyroidism, temporal arteritis and others who presents for follow up.   We have been weaning her steroids and she is currently on '4mg'$ /day.  She notes that about 5 days ago her symptoms of leg pain, difficulty climbing the stairs and fatigue have started to return. They are milder than previous and not associated with a headache or temporal pain.  She is due to go down to '3mg'$ /day on the 20th.  She has no unilateral weakness or falls per report.  She has had no signs/symtpoms of infection, but she did recover well from a cold without needing to seek care.   She has had increase in her blood sugars since being off medication for diabetes and her BP is also running high.  She has most BP in the 150s/80s per her documentation that she brought in.  She has no chest pain, headache, change in vision, dizziness, lightheadedness.    She is requesting refills on her levothyroxine and lancets.   Review of Systems  Constitutional: Positive for activity change and fatigue. Negative for chills and fever.  Respiratory: Negative for cough and shortness of breath.   Cardiovascular: Negative for chest pain and leg swelling.  Neurological: Positive for weakness. Negative for dizziness, light-headedness and headaches.       Objective:   Physical Exam Vitals signs and nursing note reviewed.  Constitutional:      General: She is not in acute distress.    Appearance: Normal appearance. She is not toxic-appearing.  HENT:     Head: Normocephalic and atraumatic.     Comments: She has no tenderness of the temples bilaterally Eyes:     General: No scleral icterus.       Right eye: No discharge.        Left eye: No discharge.  Cardiovascular:     Rate and Rhythm: Normal rate and regular rhythm.       Heart sounds: No murmur.  Pulmonary:     Effort: Pulmonary effort is normal. No respiratory distress.     Breath sounds: Normal breath sounds. No wheezing.  Musculoskeletal:        General: No swelling, tenderness or deformity.  Skin:    General: Skin is warm and dry.  Neurological:     General: No focal deficit present.     Mental Status: She is alert and oriented to person, place, and time. Mental status is at baseline.     Motor: No weakness.     Gait: Gait normal.  Psychiatric:        Mood and Affect: Mood normal.    CRP, ESR, TSH today  A1C 8.7.       Assessment & Plan:  RTC in 4 weeks, sooner if needed.

## 2018-12-03 LAB — SEDIMENTATION RATE: SED RATE: 19 mm/h (ref 0–40)

## 2018-12-03 LAB — TSH: TSH: 2.82 u[IU]/mL (ref 0.450–4.500)

## 2018-12-03 LAB — C-REACTIVE PROTEIN: CRP: 5 mg/L (ref 0–10)

## 2018-12-04 ENCOUNTER — Telehealth: Payer: Self-pay | Admitting: Internal Medicine

## 2018-12-04 NOTE — Assessment & Plan Note (Signed)
She is a little worse today, in the last week.  She notes that her muscle aches and fatigue are back, but better than before.   Plan Will check ESR and CRP (not elevated) Continue Prednisone at 51m for 1 more month Return in 1 month to decide if we can continue to wean her steroids.

## 2018-12-04 NOTE — Assessment & Plan Note (Signed)
Fatigue and aching could be explained by worsening thyroid disease.  I checked her TSH today and it was well controlled.   Plan Continue synthroid at current dose.

## 2018-12-04 NOTE — Assessment & Plan Note (Signed)
Less controlled today given she has not been taking any medications and she is on steroids.  A1C is 8.7.  She is very insistent on being on glimepiride.  I discussed with her the risk of long acting sulfonylurea again today and at last visit.  She feels that she has been on it for a long time and had no issue, so she doesn't believe she will have an issue going forward.   Plan Start back glimepiride at 1mg  only.  I would not titrate this medication aggressively given age.  Refill lancets Follow up in 1 month with BS log

## 2018-12-04 NOTE — Assessment & Plan Note (Signed)
BP is running high today.  Possibly due to continued steroid dosing.  She reports taking her medication as prescribed.  She brought in her BP log and she has some levels in the 827M systolic, but most are 786 - 754 systolic.  We discussed increasing her valsartan.  She is hesitant but willing to give it a try.   Plan Continue amlodipine Increase Valsartan to 320mg  Advise and information re: Side effects of this medication discussed.

## 2018-12-04 NOTE — Telephone Encounter (Signed)
Called patient, confirmed identity with name and DOB.   Informed her that inflammatory markers were WNL and TSH was at goal.  No change to plan which we discussed at her appointment.   Gilles Chiquito, MD

## 2018-12-09 ENCOUNTER — Encounter: Payer: Self-pay | Admitting: *Deleted

## 2018-12-15 ENCOUNTER — Other Ambulatory Visit: Payer: Self-pay | Admitting: Internal Medicine

## 2018-12-15 DIAGNOSIS — I10 Essential (primary) hypertension: Secondary | ICD-10-CM

## 2018-12-22 ENCOUNTER — Ambulatory Visit: Payer: Medicare Other | Admitting: Physical Therapy

## 2018-12-25 ENCOUNTER — Encounter: Payer: Self-pay | Admitting: Gastroenterology

## 2018-12-25 ENCOUNTER — Ambulatory Visit (INDEPENDENT_AMBULATORY_CARE_PROVIDER_SITE_OTHER): Payer: Medicare Other | Admitting: Gastroenterology

## 2018-12-25 VITALS — BP 116/50 | HR 80 | Ht 64.0 in | Wt 125.1 lb

## 2018-12-25 DIAGNOSIS — K623 Rectal prolapse: Secondary | ICD-10-CM | POA: Diagnosis not present

## 2018-12-25 DIAGNOSIS — R159 Full incontinence of feces: Secondary | ICD-10-CM | POA: Diagnosis not present

## 2018-12-25 MED ORDER — LOPERAMIDE HCL 2 MG PO TABS: 2.0000 mg | ORAL_TABLET | Freq: Every day | ORAL | 1 refills | Status: DC

## 2018-12-25 NOTE — Progress Notes (Signed)
HPI :  85 year old female here for follow-up visit. She was previously seen in the office for intermittent rectal bleeding with occasional fecal incontinence and rectal prolapse.  Patient underwent colonoscopy with me on 05/2018. She had one adenoma removed however prep was poor. No large polyps or masses seen. Changes c/w rectal prolapse. Biopsies negative for microscopic colitis. She was referred to pelvic floor PT.  She reports when she participated in pelvic floor PT this definitely helped her symptoms. She had less prolapse and less leakage. She reports she had some other illnesses that came up and she canceled the rest of her pelvic floor PT. She has not been doing this and reports that her symptoms have since recurred. She has 3 bowel movements are more per day. Stools are usually loose and she does have leakage at times and continues to have prolapse with bowel movements. She has some scant blood noted on the toilet paper from the prolapse but no blood in the stools. She has been taking some stool softeners or Benefiber sporadically. She has not tried Imodium.  Colonoscopy 05/27/2018 - The digital rectal exam findings include decreased sphincter tone. - The terminal ileum appeared normal. 20mm cecal polyp, 40mm ascending colon polyp, path shows one polyp adenomatous.  Multiple medium-mouthed diverticula were found in the sigmoid colon. The colon was tortuous. Changes in the rectum due to prolapse. Biopsies showed no microscopic colitis. Residual stool in colon     Past Medical History:  Diagnosis Date  . Anemia   . Anginal pain ([place])   . Arthritis    "back, arms, hips; hands" (11/30/2015)  . Benign hypertensive heart and kidney disease with diastolic CHF, NYHA class II and CKD stage III ([place])   . Blood transfusion 1972   "after daughter born, attempted to give me blood; couldn't give it cause my blood was cold" (09/23/2013)  . CAD (coronary artery disease)    a. LHC 08/23/11: dLM 40-50%,  oRI 40%, oCFX 40%, oD1 70% (small and not amenable to PCI).  LM lesion did not appear to be flow limiting.  Medical rx was recommended;  b. Echo 08/23/11: mild LVH, EF 60-65%, grade 1 diast dysfxn, mild BAE, PASP 24;  c. 02/2012  Cath: LM 50d, LAD 50p, D1 70ost, RI 70p, RCA ok->Med Rx;  d. 01/2013 Cardiolite: EF 88, no ischemia/infarct.  . Carotid stenosis    a. dopplers 10/12:  0-39% bilat ICA;  b. 10/2012 U/S: 0-39% bilat, f/u 1 yr (10/2013).  . Chronic bronchitis   . Depression   . Diverticulosis of colon (without mention of hemorrhage)   . GERD (gastroesophageal reflux disease)   . Heart murmur, systolic    2-D echo in December 2011 showed a normal EF with grade 1 diastolic dysfunction, trivial pulmonary regurgitation and mildly elevated PA pressure at 37 mmHg probably secondary to her COPD.dynamic obstruction-mid cavity obliteration;  b. 02/2012 Echo: EF 60-65%, mild LVH, PASP 55mmHg.  Marland Kitchen History of stomach ulcers 1970's  . Hyperlipidemia   . Hypertension   . Hypothyroidism   . Migraines    a. Next atypical symptoms in the past. Patient was started on Neurontin for possible neuropathic origin of her pain.  . Polymyalgia rheumatica ([place])   . Shortness of breath on exertion    "just related to angina >1 yr ago" (09/23/2013)  . Sinus arrhythmia   . Type II diabetes mellitus ([place])    a. On oral hypoglycemic agents.     Past Surgical History:  Procedure Laterality Date  . CARDIAC CATHETERIZATION N/A 09/26/2015   Procedure: Right/Left Heart Cath and Coronary Angiography;  Surgeon: Larey Dresser, MD;  Location: [facility];  Service: Cardiovascular;  Laterality: N/A;  . CATARACT EXTRACTION W/ INTRAOCULAR LENS  IMPLANT, BILATERAL Bilateral 1990's  . LEFT HEART CATHETERIZATION WITH CORONARY ANGIOGRAM N/A 03/16/2012   Procedure: LEFT HEART CATHETERIZATION WITH CORONARY ANGIOGRAM;  Surgeon: Hillary Bow, MD;  Location: [facility];  Service: Cardiovascular;  Laterality: N/A;  .  RIGHT/LEFT HEART CATH AND CORONARY ANGIOGRAPHY N/A 08/12/2017   Procedure: RIGHT/LEFT HEART CATH AND CORONARY ANGIOGRAPHY;  Surgeon: Martinique, Peter M, MD;  Location: [facility];  Service: Cardiovascular;  Laterality: N/A;  . VAGINAL HYSTERECTOMY  1976   Family History  Problem Relation Age of Onset  . Colon cancer Maternal Grandmother   . Heart attack Maternal Grandmother   . COPD Father   . Other Mother        died of unknown causes in her 56's. Pt raised by grandmother.  . Pulmonary Hypertension Daughter    Social History   Tobacco Use  . Smoking status: Never Smoker  . Smokeless tobacco: Never Used  Substance Use Topics  . Alcohol use: No    Alcohol/week: 0.0 standard drinks    Comment: 11/29/2016 "Last drink 1967"  . Drug use: No   Current Outpatient Medications  Medication Sig Dispense Refill  . acetaminophen (TYLENOL) 500 MG tablet Take 1,000 mg by mouth every 6 (six) hours as needed for headache. For pain    . ADVAIR DISKUS 250-50 MCG/DOSE AEPB INHALE 1 DOSE BY MOUTH EVERY 12 HOURS 60 each 3  . amLODipine (NORVASC) 10 MG tablet TAKE 1 TABLET BY MOUTH ONCE DAILY 90 tablet 1  . Ascorbic Acid (VITAMIN C PO) Take 2 tablets by mouth daily.     Marland Kitchen atorvastatin (LIPITOR) 40 MG tablet Take 0.5 tablets (20 mg total) by mouth every evening. 90 tablet 1  . clopidogrel (PLAVIX) 75 MG tablet TAKE 1 TABLET BY MOUTH EVERY EVENING 90 tablet 3  . esomeprazole (NEXIUM) 20 MG capsule TAKE 1 CAPSULE BY MOUTH ONCE DAILY OR EVERY OTHER DAY AS DIRECTED 90 capsule 0  . fluticasone (FLONASE) 50 MCG/ACT nasal spray Place 1 spray into both nostrils daily. (Patient taking differently: Place 1 spray into both nostrils as needed. ) 16 g 2  . glimepiride (AMARYL) 1 MG tablet take 1 tablet by mouth once daily BEFORE BREAKFAST. 60 tablet 0  . imipramine (TOFRANIL) 50 MG tablet Take 1 tablet (50 mg total) by mouth 2 (two) times daily. 180 tablet 1  . Lancets (ONETOUCH ULTRASOFT) lancets Use as  instructed 100 each 12  . levothyroxine (SYNTHROID, LEVOTHROID) 25 MCG tablet Take 1 tablet (25 mcg total) by mouth daily. 90 tablet 1  . Multiple Vitamin (MULTIVITAMIN WITH MINERALS) TABS tablet Take 1 tablet by mouth daily. (Patient taking differently: Take 2 tablets by mouth daily. ) 30 tablet 2  . ONE TOUCH ULTRA TEST test strip 1 each by Other route 2 (two) times daily. diag code E11.9. non insulin dependent 200 each 1  . polycarbophil (FIBERCON) 625 MG tablet Take 1 tablet (625 mg total) by mouth daily. 30 tablet 2  . predniSONE (DELTASONE) 1 MG tablet Take 4 tablets (4 mg total) by mouth daily with breakfast. For 4 weeks then return to clinic. 120 tablet 0  . valsartan (DIOVAN) 160 MG tablet Take 2 tablets (320 mg total) by mouth daily.  60 tablet 11  . loperamide (LOPERAMIDE A-D) 2 MG tablet Take 1 tablet (2 mg total) by mouth daily before breakfast. You can increase or decrease as needed 90 tablet 1   No current facility-administered medications for this visit.    Allergies  Allergen Reactions  . Aspirin Swelling and Other (See Comments)    Angioedema  . Atarax [Hydroxyzine] Other (See Comments)    psychosis  . Nsaids Other (See Comments)    "interacts with heart medications"  . Codeine Nausea And Vomiting and Other (See Comments)    "makes me deathly sick" - can take with nausea medicine   . Haldol [Haloperidol] Other (See Comments)    EPS - rigidity and dystonic reaction, very sensitive  . Other Other (See Comments)    MSG  . Penicillins Diarrhea    "real bad" Has patient had a PCN reaction causing immediate rash, facial/tongue/throat swelling, SOB or lightheadedness with hypotension: Yes Has patient had a PCN reaction causing severe rash involving mucus membranes or skin necrosis: No Has patient had a PCN reaction that required hospitalization No Has patient had a PCN reaction occurring within the last 10 years: Yes If all of the above answers are "NO", then may proceed with  Cephalosporin use.   . Metoprolol     Chronotropic incompetence     Review of Systems: All systems reviewed and negative except where noted in HPI.   Lab Results  Component Value Date   CREATININE 1.08 (H) 09/09/2018   BUN 18 09/09/2018   NA 139 09/09/2018   K 3.7 09/09/2018   CL 100 09/09/2018   CO2 22 09/09/2018    Lab Results  Component Value Date   WBC 8.1 03/07/2018   HGB 12.0 03/07/2018   HCT 37.7 03/07/2018   MCV 88.7 03/07/2018   PLT 373 03/07/2018  ]  Lab Results  Component Value Date   ALT 14 11/26/2017   AST 16 11/26/2017   GGT 33 09/17/2016   ALKPHOS 175 (H) 11/26/2017   BILITOT 0.2 11/26/2017     Physical Exam: BP (!) 116/50   Pulse 80   Ht 5\' 4"  (1.626 m)   Wt 125 lb 1 oz (56.7 kg)   LMP 02/11/1975 (Approximate)   BMI 21.47 kg/m  Constitutional: Pleasant, female in no acute distress. HEENT: Normocephalic and atraumatic. Conjunctivae are normal. No scleral icterus. Neck supple.  Cardiovascular: Normal rate, regular rhythm.  Pulmonary/chest: Effort normal and breath sounds normal. No wheezing, rales or rhonchi. Abdominal: Soft, nondistended, nontender. . There are no masses palpable. No hepatomegaly. Extremities: no edema Lymphadenopathy: No cervical adenopathy noted. Neurological: Alert and oriented to person place and time. Skin: Skin is warm and dry. No rashes noted. Psychiatric: Normal mood and affect. Behavior is normal.   ASSESSMENT AND PLAN: 85 y/o female here for reassessment of the following issues:  Rectal prolapse / fecal incontinence - the patient has intermittent rectal prolapse with weakened pelvic floor muscles in association with her loose stools the spleen to occasional incontinence. She initially had a nice response to pelvic floor PT but has since stopped it due to other illnesses over the past few months. I discussed options with her. I would hope to avoid surgery for this if at all possible, I think that would be  difficult for her to deal with. I recommend she go back to pelvic floor PT for further therapy as this helped before, and she should continue with it. Also recommend she try loperamide 1  tab in a.m. and titrate up or down as needed for goal of one formed bowel movement per day possible. If this constipates her she was instructed to lower the dose or stop it. She had one adenoma on her colonoscopy, otherwise I didn't see any other large polyps or mass lesions however her prep was not too good. Given her age and other comorbidities I don't feel that we need to repeat her colonoscopy, she agreed with that. Will await her course with conservative therapy, she can follow up PRN if symptoms persist despite this.  Milnor Cellar, MD [facility]

## 2018-12-25 NOTE — Patient Instructions (Addendum)
If you are age 83 or older, your body mass index should be between 23-30. Your Body mass index is 21.47 kg/m. If this is out of the aforementioned range listed, please consider follow up with your Primary Care Provider.  If you are age 53 or younger, your body mass index should be between 19-25. Your Body mass index is 21.47 kg/m. If this is out of the aformentioned range listed, please consider follow up with your Primary Care Provider.   We have sent the following medications to your pharmacy for you to pick up at your convenience: Loperamide 2mg : Take every morning.  You can increase or decrease as needed  We will refer you back to Physical Therapy for Pelvic Floor therapy. They will contact you to schedule.   Thank you for entrusting me with your care and for choosing [facility], Dr. Austin Cellar

## 2018-12-30 ENCOUNTER — Ambulatory Visit: Payer: Self-pay | Admitting: Internal Medicine

## 2018-12-30 ENCOUNTER — Other Ambulatory Visit: Payer: Self-pay | Admitting: *Deleted

## 2018-12-30 MED ORDER — ONETOUCH DELICA LANCETS 30G MISC: 1.0000 [IU] | Freq: Every day | 3 refills | Status: DC

## 2018-12-30 NOTE — Telephone Encounter (Signed)
Pt was here at the clinic - stated the wrong lancets were ordered. Onetouch Ultra soft lancets were refilled but she uses [employer]. Please send new rx. I called [facility] - stated she uses Delica 30 G. Thanks

## 2019-01-04 ENCOUNTER — Ambulatory Visit: Payer: Medicare Other | Admitting: Physical Therapy

## 2019-01-13 ENCOUNTER — Ambulatory Visit (INDEPENDENT_AMBULATORY_CARE_PROVIDER_SITE_OTHER): Payer: Medicare Other | Admitting: Internal Medicine

## 2019-01-13 ENCOUNTER — Other Ambulatory Visit: Payer: Self-pay

## 2019-01-13 ENCOUNTER — Encounter: Payer: Self-pay | Admitting: Internal Medicine

## 2019-01-13 DIAGNOSIS — Z888 Allergy status to other drugs, medicaments and biological substances status: Secondary | ICD-10-CM

## 2019-01-13 DIAGNOSIS — I10 Essential (primary) hypertension: Secondary | ICD-10-CM

## 2019-01-13 DIAGNOSIS — I4719 Other supraventricular tachycardia: Secondary | ICD-10-CM | POA: Insufficient documentation

## 2019-01-13 DIAGNOSIS — E119 Type 2 diabetes mellitus without complications: Secondary | ICD-10-CM

## 2019-01-13 DIAGNOSIS — Z79899 Other long term (current) drug therapy: Secondary | ICD-10-CM | POA: Diagnosis not present

## 2019-01-13 DIAGNOSIS — I5032 Chronic diastolic (congestive) heart failure: Secondary | ICD-10-CM | POA: Insufficient documentation

## 2019-01-13 DIAGNOSIS — K623 Rectal prolapse: Secondary | ICD-10-CM

## 2019-01-13 DIAGNOSIS — M316 Other giant cell arteritis: Secondary | ICD-10-CM

## 2019-01-13 DIAGNOSIS — I120 Hypertensive chronic kidney disease with stage 5 chronic kidney disease or end stage renal disease: Secondary | ICD-10-CM

## 2019-01-13 DIAGNOSIS — Z7984 Long term (current) use of oral hypoglycemic drugs: Secondary | ICD-10-CM | POA: Diagnosis not present

## 2019-01-13 DIAGNOSIS — Z7902 Long term (current) use of antithrombotics/antiplatelets: Secondary | ICD-10-CM | POA: Diagnosis not present

## 2019-01-13 DIAGNOSIS — I471 Supraventricular tachycardia: Secondary | ICD-10-CM | POA: Insufficient documentation

## 2019-01-13 DIAGNOSIS — N183 Chronic kidney disease, stage 3 unspecified: Secondary | ICD-10-CM | POA: Insufficient documentation

## 2019-01-13 DIAGNOSIS — E1122 Type 2 diabetes mellitus with diabetic chronic kidney disease: Secondary | ICD-10-CM | POA: Insufficient documentation

## 2019-01-13 LAB — GLUCOSE, CAPILLARY: Glucose-Capillary: 126 mg/dL — ABNORMAL HIGH (ref 70–99)

## 2019-01-13 MED ORDER — PREDNISONE 1 MG PO TABS: 3.0000 mg | ORAL_TABLET | Freq: Every day | ORAL | 0 refills | Status: DC

## 2019-01-13 MED ORDER — GLIMEPIRIDE 1 MG PO TABS: ORAL_TABLET | ORAL | 3 refills | Status: DC

## 2019-01-13 MED ORDER — ONETOUCH DELICA LANCETS 30G MISC: 1.0000 [IU] | Freq: Every day | 3 refills | Status: DC

## 2019-01-13 NOTE — Progress Notes (Signed)
Subjective:    [name], female    DOB: 01/24/1934, 85 y.o.   MRN: [id number]  4 week follow up for PMR/GCA and blood pressure.   HPI  Christine [name] is an 85 year old woman with PMH of PMR/GCA (declined temporal artery biopsy previously), HTN, DM2 who presents for follow up.   Christine [name] reports that she has been having pain in her thighs, but mainly when she tries to have a bowel movement.  She has rectal prolapse for which she is following with pelvic floor PT and GI.  She will have a BM and then have bottom and thigh pain.  She feels that this is different than the achiness she was having with her PMR pain.  She has had no headaches.  She brought in a BS and BP log and she continues to have paroxysmal high systolic BP to the 182X- 937J but this is a mild bit better on the higher dose of valsartan.  She notes in her log, that the high BPs were always on days when she had more pain.  She notes that her BS are better.  She is not due for an A1C.  She would like to know if she can drink ensure to stave off early morning low blood sugars and for nutrition, which I think is okay.  Previously, she has insisted on being on glimepiride, though I discussed the risks and benefits with her.  I am concerned of her having low blood sugars, and she seems to be managing with sugary foods in the evening.    She reported some intermittent numbness of the right hand. She is not having it today, no weakness, no grip strength issues.  She notes that it seems worse on days she sleeps on that shoulder.  She will rub it and it will improve.  Not occurring daily.  She similarly, had a burning sensation on the skin of her left arm yesterday, which has since improved.  She has no history of cervical stenosis that she is aware of.  Review of Systems  Constitutional: Negative for activity change and unexpected weight change.  Cardiovascular: Negative for chest pain.  Gastrointestinal: Positive for rectal pain.  Negative for constipation.  Genitourinary: Negative for difficulty urinating and dysuria.  Musculoskeletal: Positive for arthralgias. Negative for gait problem.  Neurological: Positive for numbness (right palm). Negative for dizziness, weakness, light-headedness and headaches.  Psychiatric/Behavioral: Negative for decreased concentration and dysphoric mood.       Objective:   Physical Exam Vitals signs and nursing note reviewed.  Constitutional:      General: She is not in acute distress.    Appearance: She is not toxic-appearing.  HENT:     Head: Normocephalic and atraumatic.  Pulmonary:     Effort: Pulmonary effort is normal. No respiratory distress.  Skin:    General: Skin is warm and dry.  Neurological:     General: No focal deficit present.     Mental Status: She is alert. Mental status is at baseline.     Motor: No weakness.     Gait: Gait normal.     Comments: She has excellent grip strength bilaterally, good ROM of the right shoulder without pain, good strength of biceps and forearm.  No apparent deficit.   Psychiatric:        Mood and Affect: Mood normal.        Behavior: Behavior normal.   No labs today.  Assessment & Plan:  RTC in 4 weeks, sooner if needed.

## 2019-01-13 NOTE — Assessment & Plan Note (Addendum)
BP is slightly better today with the increased valsartan.  She has many allergies and has not tolerated other medications in the past.  Will plan to continue current therapy and have her to continue checking her BP at home along with her blood sugar  Plan Continue amlodipine and valsartan 320mg 

## 2019-01-13 NOTE — Assessment & Plan Note (Signed)
She reports doing well today.  She is amenable to decreasing her steroid dose.  Will plan to decrease to 62m daily and to follow up in 4 weeks.   Plan Decrease prednisone to 334mdaily Return in 4 weeks Consider ESR at next visit if pain persistent in her thighs.

## 2019-01-13 NOTE — Assessment & Plan Note (Signed)
- 

## 2019-01-13 NOTE — Assessment & Plan Note (Signed)
BS are better controlled based on her log that she brought in today.  She is having morning lows, for which she is eating candy at night.  She is going to change over to ensure at night for better nutrition.  She is very adamant about staying on glimepiride (have tried no therapy, and glipizde and januvia in the past) and we will continue this for now.  If she continues to have low blood sugars, will restart discussion concerning the dangers of this medication.    Plan Continue glimepiride 1mg  daily

## 2019-01-13 NOTE — Patient Instructions (Addendum)
Christine [name] -   Thank you for coming in to see me today.   For your GCA (giant cell arteritis/PMR) please DECREASE your prednisone to 3mg  per day.  (3 tablets)  I have refilled your glimepiride and your One Touch Delica lancets.   Thank you!  Come back to see me in 4 weeks.

## 2019-01-14 ENCOUNTER — Ambulatory Visit: Payer: Medicare Other | Admitting: Physical Therapy

## 2019-01-18 ENCOUNTER — Encounter: Payer: Self-pay | Admitting: Physical Therapy

## 2019-01-18 ENCOUNTER — Ambulatory Visit: Payer: Medicare Other | Attending: Gastroenterology | Admitting: Physical Therapy

## 2019-01-18 ENCOUNTER — Other Ambulatory Visit: Payer: Self-pay

## 2019-01-18 DIAGNOSIS — M6281 Muscle weakness (generalized): Secondary | ICD-10-CM | POA: Diagnosis not present

## 2019-01-18 DIAGNOSIS — R279 Unspecified lack of coordination: Secondary | ICD-10-CM

## 2019-01-18 DIAGNOSIS — K623 Rectal prolapse: Secondary | ICD-10-CM

## 2019-01-18 NOTE — Therapy (Signed)
[facility] [address], [place], [place], [address] Phone: [phone]   Fax:  [phone]  Physical Therapy Evaluation  Patient Details  Name: Christine [name] MRN: [id number] Date of Birth: 01/24/1934 Referring Provider (PT): Dr. Barren Cellar   Encounter Date: 01/18/2019  PT End of Session - 01/18/19 0903    Visit Number  1    Date for PT Re-Evaluation  03/15/19    Authorization Type  Medicare/Medicaid    PT Start Time  0845    PT Stop Time  0925    PT Time Calculation (min)  40 min    Activity Tolerance  Patient tolerated treatment well    Behavior During Therapy  [facility] for tasks assessed/performed       Past Medical History:  Diagnosis Date  . Anemia   . Anginal pain ([place])   . Arthritis    "back, arms, hips; hands" (11/30/2015)  . Benign hypertensive heart and kidney disease with diastolic CHF, NYHA class II and CKD stage III ([place])   . Blood transfusion 1972   "after daughter born, attempted to give me blood; couldn't give it cause my blood was cold" (09/23/2013)  . CAD (coronary artery disease)    a. LHC 08/23/11: dLM 40-50%, oRI 40%, oCFX 40%, oD1 70% (small and not amenable to PCI).  LM lesion did not appear to be flow limiting.  Medical rx was recommended;  b. Echo 08/23/11: mild LVH, EF 60-65%, grade 1 diast dysfxn, mild BAE, PASP 24;  c. 02/2012  Cath: LM 50d, LAD 50p, D1 70ost, RI 70p, RCA ok->Med Rx;  d. 01/2013 Cardiolite: EF 88, no ischemia/infarct.  . Carotid stenosis    a. dopplers 10/12:  0-39% bilat ICA;  b. 10/2012 U/S: 0-39% bilat, f/u 1 yr (10/2013).  . Chronic bronchitis   . Depression   . Diverticulosis of colon (without mention of hemorrhage)   . GERD (gastroesophageal reflux disease)   . Heart murmur, systolic    2-D echo in December 2011 showed a normal EF with grade 1 diastolic dysfunction, trivial pulmonary regurgitation and mildly elevated PA pressure at 37 mmHg probably secondary to her  COPD.dynamic obstruction-mid cavity obliteration;  b. 02/2012 Echo: EF 60-65%, mild LVH, PASP 19mmHg.  Marland Kitchen History of stomach ulcers 1970's  . Hyperlipidemia   . Hypertension   . Hypothyroidism   . Migraines    a. Next atypical symptoms in the past. Patient was started on Neurontin for possible neuropathic origin of her pain.  . Polymyalgia rheumatica ([place])   . Shortness of breath on exertion    "just related to angina >1 yr ago" (09/23/2013)  . Sinus arrhythmia   . Type II diabetes mellitus ([place])    a. On oral hypoglycemic agents.    Past Surgical History:  Procedure Laterality Date  . CARDIAC CATHETERIZATION N/A 09/26/2015   Procedure: Right/Left Heart Cath and Coronary Angiography;  Surgeon: Larey Dresser, MD;  Location: [facility];  Service: Cardiovascular;  Laterality: N/A;  . CATARACT EXTRACTION W/ INTRAOCULAR LENS  IMPLANT, BILATERAL Bilateral 1990's  . LEFT HEART CATHETERIZATION WITH CORONARY ANGIOGRAM N/A 03/16/2012   Procedure: LEFT HEART CATHETERIZATION WITH CORONARY ANGIOGRAM;  Surgeon: Hillary Bow, MD;  Location: [facility] CATH LAB;  Service: Cardiovascular;  Laterality: N/A;  . RIGHT/LEFT HEART CATH AND CORONARY ANGIOGRAPHY N/A 08/12/2017   Procedure: RIGHT/LEFT HEART CATH AND CORONARY ANGIOGRAPHY;  Surgeon: Martinique, Peter M, MD;  Location: [facility];  Service: Cardiovascular;  Laterality: N/A;  . VAGINAL HYSTERECTOMY  1976    There were no vitals filed for this visit.   Subjective Assessment - 01/18/19 0850    Subjective  The doctor has given me medicine to help me from going to the bathroom often. I have been doing the exercises for my pelvic floor. I have been doing the bike for 20 minutes.     Patient Stated Goals  reduce the prolapse    Currently in Pain?  Yes    Pain Score  9    low 3/10   Pain Location  Rectum    Pain Orientation  Mid    Pain Descriptors / Indicators  Aching    Pain Type  Acute pain    Pain Radiating Towards  radiates in thighs  and knees    Pain Onset  More than a month ago    Pain Frequency  Intermittent    Aggravating Factors   leak stool several times per day    Pain Relieving Factors  tylenol, pelvic floor exercises    Multiple Pain Sites  No         [facility] PT Assessment - 01/18/19 0001      Assessment   Medical Diagnosis  K62.3 Rectal prolapse; R15.9 incontinence of feces, unspecified fecal incontinence type    Referring Provider (PT)  Dr. Ghent Cellar    Onset Date/Surgical Date  05/18/18    Prior Therapy  yes      Precautions   Precautions  None      Restrictions   Weight Bearing Restrictions  No      Balance Screen   Has the patient fallen in the past 6 months  No    Has the patient had a decrease in activity level because of a fear of falling?   No    Is the patient reluctant to leave their home because of a fear of falling?   No      Home Environment   Living Environment  Private residence    Living Arrangements  Children    Available Help at Discharge  Family;Available PRN/intermittently    Type of Home  House    Home Access  Stairs to enter    Entrance Stairs-Number of Steps  2    Entrance Stairs-Rails  None    Home Layout  Two level;Bed/bath upstairs      Prior Function   Level of Independence  Independent    Vocation  Retired      Associate Professor   Overall Cognitive Status  Within Functional Limits for tasks assessed      Posture/Postural Control   Posture/Postural Control  No significant limitations      ROM / Strength   AROM / PROM / Strength  AROM;PROM;Strength      Strength   Right Hip Extension  4/5    Right Hip External Rotation   5/5    Right Hip ABduction  4/5    Left Hip Extension  4/5    Left Hip External Rotation  5/5    Left Hip ABduction  4/5                Objective measurements completed on examination: See above findings.    Pelvic Floor Special Questions - 01/18/19 0001    Urinary Leakage  No    Fecal incontinence  Yes   fecal leakage  2-4 times per day then has pain   External Perineal  Exam  significant rectal prolapse that was reduced by the therapist. in sidely    Skin Integrity  Intact    Pelvic Floor Internal Exam  Patient comfirms identification and approves PT to assess pelvic floor and treatment    Exam Type  Rectal    Palpation  decreased external sphinter strength post. and on the left side, able to produce a lift for contraction    Strength  fair squeeze, definite lift    Strength # of reps  4    Strength # of seconds  5               PT Education - 01/18/19 0920    Education Details  pelvic floor contraction in sidely; resting 2 times per day to reduce prolapse    Person(s) Educated  Patient    Methods  Explanation;Demonstration;Verbal cues;Handout    Comprehension  Verbalized understanding;Returned demonstration       PT Short Term Goals - 01/18/19 0928      PT SHORT TERM GOAL #1   Title  independent with initial HEP with pelvic floor contraction    Time  4    Period  Weeks    Status  New    Target Date  02/15/19      PT SHORT TERM GOAL #2   Title  abitliy to contract the pelvic floor and lower abdominal to improve continence    Time  4    Period  Weeks    Status  New    Target Date  02/15/19      PT SHORT TERM GOAL #3   Title  understand correct toilet technique to fully evacuate his bowels    Time  4    Period  Weeks    Status  New    Target Date  02/15/19        PT Long Term Goals - 01/18/19 0929      PT LONG TERM GOAL #1   Title  independent with HEP and understand how to progress herself    Time  8    Period  Weeks    Status  New    Target Date  03/15/19      PT LONG TERM GOAL #2   Title  improved fecal leakage >/= 50% due to improved strength    Time  8    Period  Weeks    Status  New    Target Date  03/15/19      PT LONG TERM GOAL #3   Title  rectal prolpase reduce 50% of the time due to imrpoved pelvic floor strength >/= 3/5 holding or 10 seconds    Period   Weeks    Status  New    Target Date  03/15/19      PT LONG TERM GOAL #4   Title  understand ways to manage rectal prolpase     Time  8    Period  Weeks    Status  New    Target Date  03/15/19             Plan - 01/18/19 6629    Clinical Impression Statement  Patient is a 85 year old with a significant rectal prolapse that is able to be reduced. Patient rectal pain is 3-9/10 when she has had to go to the bathroom several times per day. Patient has fecal leakage 3-4 times per day. Patient will have pain in the thighs when she has  rectal pain. Patient pelvic floor strength is 3/5 with decreased firmness on the posterior and left side of external sphincter. Puborectalis pulls anteriorly with contraction. Patient is able to contract the pelvic floor for 5 seconds 5 times. Patient will benefit from skilled therapy to improve pelvic floor strength, educate on how to manage rectal prolpase and toileting techniques.     Personal Factors and Comorbidities  Age;Comorbidity 2    Comorbidities  diabetes, polymyalgia rheumatica; vaginal hysterectomy    Examination-Activity Limitations  Continence    Stability/Clinical Decision Making  Evolving/Moderate complexity    Clinical Decision Making  Moderate    Clinical Presentation due to:  soil her underwear throughout the day, difficulty with walking and standing due to pain from prolapse    Rehab Potential  Good    Clinical Impairments Affecting Rehab Potential  Plymyalgia rhumatica; osteopenia; dierticulosis; hypothyroidism, diabetes; vaginal hysterectomy; cardiac catherization    PT Frequency  1x / week    PT Duration  8 weeks    PT Treatment/Interventions  Biofeedback;Therapeutic exercise;Therapeutic activities;Neuromuscular re-education;Patient/family education;Manual techniques;Passive range of motion;Energy conservation    PT Next Visit Plan  pelvic floor strength in sidely with tactile cues for cueing muscles for circular contraction, toileting  technique, hip strength    Consulted and Agree with Plan of Care  Patient       Patient will benefit from skilled therapeutic intervention in order to improve the following deficits and impairments:  Increased fascial restricitons, Pain, Decreased coordination, Increased muscle spasms, Decreased activity tolerance, Decreased endurance, Decreased range of motion, Decreased strength, Impaired flexibility  Visit Diagnosis: Muscle weakness (generalized) - Plan: PT plan of care cert/re-cert  Unspecified lack of coordination - Plan: PT plan of care cert/re-cert  Rectal prolapse - Plan: PT plan of care cert/re-cert     Problem List Patient Active Problem List   Diagnosis Date Noted  . Chronic diastolic (congestive) heart failure ([place]) 01/13/2019  . PAT (paroxysmal atrial tachycardia) ([place]) 01/13/2019  . Hypertensive chronic kidney disease with stage 5 chronic kidney disease or end stage renal disease ([place]) 01/13/2019  . Rectal prolapse 06/24/2018  . Temporal arteritis ([place]) 04/06/2018  . Chronic cough 10/30/2017  . Right knee pain 08/20/2017  . MDD (major depressive disorder), recurrent, in full remission ([place]) 11/30/2016  . Protein-calorie malnutrition, severe 11/30/2016  . Elevated alkaline phosphatase level 09/18/2016  . Mixed incontinence urge and stress 05/09/2016  . Hypertensive retinopathy of both eyes, grade 2 04/20/2015  . Lumbar spondylosis 06/16/2014  . Carpal tunnel syndrome 02/18/2014  . Right shoulder pain 02/02/2014  . Osteopenia 01/11/2013  . Routine adult health maintenance 01/11/2013  . Diverticulosis 09/18/2012  . Dyspnea on exertion 03/24/2012  . Fatigue 02/26/2012  . Allergic rhinitis 02/13/2012  . Coronary artery disease 08/29/2011  . HEART MURMUR, SYSTOLIC 61/60/7371  . Enlargement of clavicle 03/30/2008  . Solitary pulmonary nodule 12/15/2006  . Hyperlipidemia 11/28/2006  . Hypothyroidism 09/04/2006  . Type 2 diabetes mellitus, controlled ([place])  09/04/2006  . Migraine 09/04/2006  . Essential hypertension 09/04/2006  . GERD 09/04/2006    Earlie Counts, PT 01/18/19 9:32 AM   [facility] [address] [place], [place], [address] Phone: [phone]   Fax:  [phone]  Name: Christine [name] MRN: [id number] Date of Birth: 01/24/1934

## 2019-01-18 NOTE — Patient Instructions (Addendum)
Lay on your side 2 times per day for 15 min and make sure the prolapse is tucked back in  Quick Contraction: Gravity Eliminated (Side-Lying)    Lie on left side, hips and knees slightly bent. Quickly squeeze then fully relax pelvic floor. Perform _1__ sets of __5_. Rest for _1__ seconds between sets. Do _4__ times a day.   Copyright  VHI. All rights reserved.  Slow Contraction: Gravity Eliminated (Side-Lying)    Lie on left side, hips and knees slightly bent. Slowly squeeze pelvic floor for _5__ seconds. Rest for __5_ seconds. Repeat _10__ times. Do _4__ times a day.   Copyright  VHI. All rights reserved.  [facility] [address] [place], [place] [address] Phone # [phone] Fax [phone]

## 2019-02-03 ENCOUNTER — Ambulatory Visit: Payer: Medicare Other | Admitting: Physical Therapy

## 2019-02-05 ENCOUNTER — Other Ambulatory Visit: Payer: Self-pay | Admitting: Internal Medicine

## 2019-02-05 DIAGNOSIS — E785 Hyperlipidemia, unspecified: Secondary | ICD-10-CM

## 2019-02-08 ENCOUNTER — Encounter: Payer: Self-pay | Admitting: Physical Therapy

## 2019-02-10 ENCOUNTER — Ambulatory Visit: Payer: Self-pay | Admitting: Internal Medicine

## 2019-02-11 ENCOUNTER — Telehealth: Payer: Self-pay

## 2019-02-11 DIAGNOSIS — E119 Type 2 diabetes mellitus without complications: Secondary | ICD-10-CM

## 2019-02-11 NOTE — Telephone Encounter (Signed)
Requesting a refill on test strips. Please call pt back.

## 2019-02-11 NOTE — Telephone Encounter (Signed)
Call made to pharmacy-pt has refills on rx, but insurance is requiring MD to complete a certificate of medical necessity (CMN). Form placed in pcp's box for signature, pt unable to get test strips until form is completed.Regenia Skeeter, Lacrecia Delval Cassady3/26/202010:03 AM

## 2019-02-12 NOTE — Telephone Encounter (Signed)
Filled out.  Thank you!

## 2019-02-15 ENCOUNTER — Encounter: Payer: Self-pay | Admitting: Physical Therapy

## 2019-02-17 ENCOUNTER — Other Ambulatory Visit: Payer: Self-pay | Admitting: Internal Medicine

## 2019-02-17 DIAGNOSIS — E119 Type 2 diabetes mellitus without complications: Secondary | ICD-10-CM

## 2019-02-17 DIAGNOSIS — E785 Hyperlipidemia, unspecified: Secondary | ICD-10-CM

## 2019-02-17 MED ORDER — ATORVASTATIN CALCIUM 40 MG PO TABS: 20.0000 mg | ORAL_TABLET | Freq: Every evening | ORAL | 1 refills | Status: DC

## 2019-02-17 MED ORDER — FLUTICASONE-SALMETEROL 250-50 MCG/DOSE IN AEPB: INHALATION_SPRAY | RESPIRATORY_TRACT | 3 refills | Status: DC

## 2019-02-17 MED ORDER — ONETOUCH ULTRA BLUE VI STRP: 1.0000 | ORAL_STRIP | Freq: Two times a day (BID) | 1 refills | Status: DC

## 2019-02-17 NOTE — Telephone Encounter (Signed)
Refill Request- Per pt the CMN was not rec'd on yesterday for her test strips   ONE TOUCH ULTRA TEST test strip  atorvastatin (LIPITOR) 40 MG tablet  ADVAIR DISKUS 250-50 MCG/DOSE AEPB  [id number] - [place], [place]

## 2019-02-17 NOTE — Telephone Encounter (Signed)
Call received from Iva at [facility] stating they have not received CMN completed by PCP for test strips. Patient is completely out and is very upset. Will forward to front office to refax form to [phone]. Hubbard Hartshorn, RN, BSN

## 2019-02-17 NOTE — Telephone Encounter (Signed)
Form faxed to [facility] at [phone]. Received confirmation page that fax went through successfully. Hubbard Hartshorn, RN, BSN

## 2019-02-18 ENCOUNTER — Other Ambulatory Visit: Payer: Self-pay | Admitting: *Deleted

## 2019-02-18 DIAGNOSIS — J302 Other seasonal allergic rhinitis: Secondary | ICD-10-CM

## 2019-02-18 MED ORDER — FLUTICASONE PROPIONATE 50 MCG/ACT NA SUSP: 1.0000 | Freq: Every day | NASAL | 2 refills | Status: DC

## 2019-02-18 NOTE — Telephone Encounter (Signed)
Thank you!  I think I have filled that form out about 10 times this year.  Thank you for following through!!

## 2019-03-01 ENCOUNTER — Encounter: Payer: Self-pay | Admitting: Physical Therapy

## 2019-03-08 ENCOUNTER — Encounter: Payer: Self-pay | Admitting: Physical Therapy

## 2019-03-24 ENCOUNTER — Ambulatory Visit (INDEPENDENT_AMBULATORY_CARE_PROVIDER_SITE_OTHER): Payer: Medicare Other | Admitting: Internal Medicine

## 2019-03-24 ENCOUNTER — Other Ambulatory Visit: Payer: Self-pay

## 2019-03-24 DIAGNOSIS — M316 Other giant cell arteritis: Secondary | ICD-10-CM

## 2019-03-24 DIAGNOSIS — J309 Allergic rhinitis, unspecified: Secondary | ICD-10-CM | POA: Diagnosis not present

## 2019-03-24 DIAGNOSIS — Z7984 Long term (current) use of oral hypoglycemic drugs: Secondary | ICD-10-CM | POA: Diagnosis not present

## 2019-03-24 DIAGNOSIS — Z79899 Other long term (current) drug therapy: Secondary | ICD-10-CM | POA: Diagnosis not present

## 2019-03-24 DIAGNOSIS — E1122 Type 2 diabetes mellitus with diabetic chronic kidney disease: Secondary | ICD-10-CM

## 2019-03-24 DIAGNOSIS — Z7952 Long term (current) use of systemic steroids: Secondary | ICD-10-CM | POA: Diagnosis not present

## 2019-03-24 DIAGNOSIS — N189 Chronic kidney disease, unspecified: Secondary | ICD-10-CM

## 2019-03-24 DIAGNOSIS — K623 Rectal prolapse: Secondary | ICD-10-CM

## 2019-03-24 DIAGNOSIS — I129 Hypertensive chronic kidney disease with stage 1 through stage 4 chronic kidney disease, or unspecified chronic kidney disease: Secondary | ICD-10-CM | POA: Diagnosis not present

## 2019-03-24 DIAGNOSIS — M79606 Pain in leg, unspecified: Secondary | ICD-10-CM

## 2019-03-24 DIAGNOSIS — Z7902 Long term (current) use of antithrombotics/antiplatelets: Secondary | ICD-10-CM

## 2019-03-24 DIAGNOSIS — J302 Other seasonal allergic rhinitis: Secondary | ICD-10-CM

## 2019-03-24 DIAGNOSIS — I10 Essential (primary) hypertension: Secondary | ICD-10-CM

## 2019-03-24 DIAGNOSIS — E119 Type 2 diabetes mellitus without complications: Secondary | ICD-10-CM

## 2019-03-24 NOTE — Assessment & Plan Note (Signed)
She notes that this has been bothering her a bit since the seasons changed.  She has started taking flonase at night and is also taking loratadine and her symptoms have improved.

## 2019-03-24 NOTE — Assessment & Plan Note (Signed)
BP is doing well.  She is only having highs when she is in pain.  She has otherwise been controlled at her current doses of medications.  She is having no issues with her medications.  She does not need refills today.    Continue valsartan and amlodipine.

## 2019-03-24 NOTE — Progress Notes (Signed)
[facility] Telephone Encounter Continuity Care Appointment  HPI:   This telephone encounter was created for [name] on 03/24/2019 for the following purpose/cc follow up of DM2, temporal arteritis.  [name] reports that she is doing very well.  Her only issues are 2, she is having rectal pain from rectal prolapse.  She notes that this is better and she continues to do her physical therapy exercises, even though she cannot go in to the clinic right now.  This pain issue is improved, but still gives her a bit of an issue.  She notes that her BP ranges from the 595G systolic up to the 387F systolic when she is in pain.  She has had no issues with incontinence.  She is down to 1mg  of prednisone and feels like her leg pain is very mild and she has had no headaches.  Her BS have been ranging from the 110s-120s and she has had no low blood sugars, no shakiness, no dizziness.     Past Medical History:  Past Medical History:  Diagnosis Date  . Anemia   . Anginal pain ([place])   . Arthritis    "back, arms, hips; hands" (11/30/2015)  . Benign hypertensive heart and kidney disease with diastolic CHF, NYHA class II and CKD stage III ([place])   . Blood transfusion 1972   "after daughter born, attempted to give me blood; couldn't give it cause my blood was cold" (09/23/2013)  . CAD (coronary artery disease)    a. LHC 08/23/11: dLM 40-50%, oRI 40%, oCFX 40%, oD1 70% (small and not amenable to PCI).  LM lesion did not appear to be flow limiting.  Medical rx was recommended;  b. Echo 08/23/11: mild LVH, EF 60-65%, grade 1 diast dysfxn, mild BAE, PASP 24;  c. 02/2012  Cath: LM 50d, LAD 50p, D1 70ost, RI 70p, RCA ok->Med Rx;  d. 01/2013 Cardiolite: EF 88, no ischemia/infarct.  . Carotid stenosis    a. dopplers 10/12:  0-39% bilat ICA;  b. 10/2012 U/S: 0-39% bilat, f/u 1 yr (10/2013).  . Chronic bronchitis   . Depression   . Diverticulosis of colon (without mention of hemorrhage)   .  GERD (gastroesophageal reflux disease)   . Heart murmur, systolic    2-D echo in December 2011 showed a normal EF with grade 1 diastolic dysfunction, trivial pulmonary regurgitation and mildly elevated PA pressure at 37 mmHg probably secondary to her COPD.dynamic obstruction-mid cavity obliteration;  b. 02/2012 Echo: EF 60-65%, mild LVH, PASP 87mmHg.  Marland Kitchen History of stomach ulcers 1970's  . Hyperlipidemia   . Hypertension   . Hypothyroidism   . Migraines    a. Next atypical symptoms in the past. Patient was started on Neurontin for possible neuropathic origin of her pain.  . Polymyalgia rheumatica ([place])   . Shortness of breath on exertion    "just related to angina >1 yr ago" (09/23/2013)  . Sinus arrhythmia   . Type II diabetes mellitus ([place])    a. On oral hypoglycemic agents.      ROS:   + rectal pain.  Otherwise negative except for noted in HPI   Assessment / Plan / Recommendations:   Please see A&P under problem oriented charting for assessment of the patient's acute and chronic medical conditions.   As always, pt is advised that if symptoms worsen or new symptoms arise, they should go to an urgent care facility or to to ER for further evaluation.  Consent and Medical Decision Making:   This is a telephone encounter between [employer] and Gilles Chiquito on 03/24/2019 for follow up of temporal arteritis and DM2. The visit was conducted with the patient located at home and Gilles Chiquito at [employer]. The patient's identity was confirmed using their DOB and current address. The patient has consented to being evaluated through a telephone encounter and understands the associated risks (an examination cannot be done and the patient may need to come in for an appointment) / benefits (allows the patient to remain at home, decreasing exposure to coronavirus). I personally spent 15 minutes on medical discussion.    Follow up in 1 month telehealth, 3 months in person if possible.

## 2019-03-24 NOTE — Assessment & Plan Note (Signed)
Previously well controlled.  Worsened while on steroids.  She notes no low blood sugars.  She checks her sugar once a day and the range has been 110-120s.  She has no complaints today.  She is on a sulfonylurea.  We have discussed the risks of this type of medication with her CKD and age and she prefers to remain on this medication.  Plan A1C at next visit Continue glimepiride.

## 2019-03-24 NOTE — Patient Instructions (Signed)
Instructions given over phone.  

## 2019-03-24 NOTE — Assessment & Plan Note (Signed)
This is the main issue which bothers her today.  She notes that she continues to do the exercises she learned in physical therapy and this helps a lot.  She would like to get back in to physical therapy when she can.

## 2019-03-24 NOTE — Assessment & Plan Note (Signed)
She is doing very well.  She has been weaning her prednisone down.  She is now on 1mg /day.  She is due to be done on 5/15.  She has had only mild leg pain which she thinks is more related to straining from the rectal prolapse.  She has had no headaches.    Plan Continue to wean steroids.  Follow up in 1 month to see how she is doing.  Reminded her to call if she has return of symptoms.  No reported symptoms of AI.

## 2019-04-02 ENCOUNTER — Telehealth: Payer: Self-pay

## 2019-04-02 ENCOUNTER — Telehealth: Payer: Self-pay | Admitting: *Deleted

## 2019-04-02 MED ORDER — ONDANSETRON HCL 4 MG PO TABS: 4.0000 mg | ORAL_TABLET | Freq: Three times a day (TID) | ORAL | 0 refills | Status: DC | PRN

## 2019-04-02 MED ORDER — HYDROCODONE-ACETAMINOPHEN 5-325 MG PO TABS: 1.0000 | ORAL_TABLET | Freq: Four times a day (QID) | ORAL | 0 refills | Status: DC | PRN

## 2019-04-02 NOTE — Telephone Encounter (Signed)
Call from pt - requesting a refill on Hydrocodone and Ondansetron which she takes w/Hydrocodone to prevent  Nausea.  They are not on current med list. Thanks

## 2019-04-02 NOTE — Telephone Encounter (Signed)
Last Rx was in 2018 for knee pain.  She also has chronic intermittent hand pain for which she see Orthopedic surgery Dr. Caralyn Guile.  She is going to see him in 2 weeks.  I called her and she notes that her symptoms are acting up.  She has not needed pain medication for a while, but she feels the hydrocodone helps her the most.  She has an allergy to NSAIDs.    I will place an Rx for 20 tablets of hydrocodone-apap and zofran for her.    Thanks.   Gilles Chiquito, MD

## 2019-04-09 ENCOUNTER — Other Ambulatory Visit: Payer: Self-pay

## 2019-04-09 DIAGNOSIS — E119 Type 2 diabetes mellitus without complications: Secondary | ICD-10-CM

## 2019-04-09 MED ORDER — ONETOUCH ULTRASOFT LANCETS MISC: 12 refills | Status: DC

## 2019-04-09 NOTE — Telephone Encounter (Signed)
Please include how often pt to check he BS. Thanks

## 2019-04-09 NOTE — Telephone Encounter (Signed)
    Lancets (ONETOUCH ULTRASOFT) lancets   Refill request @  [facility] [phone] [address], [place] AT [place] [phone] (Phone) [phone] (Fax)

## 2019-04-15 DIAGNOSIS — M13842 Other specified arthritis, left hand: Secondary | ICD-10-CM | POA: Diagnosis not present

## 2019-04-15 DIAGNOSIS — G5601 Carpal tunnel syndrome, right upper limb: Secondary | ICD-10-CM | POA: Diagnosis not present

## 2019-04-19 ENCOUNTER — Other Ambulatory Visit: Payer: Self-pay | Admitting: Internal Medicine

## 2019-04-19 NOTE — Telephone Encounter (Signed)
Return call to patient, pt states she does not want the the One Touch Ultra Lancets which were sent into the pharmacy and is requesting the [place].   Pharmacy called and the pharmacy tech states pt has already picked up the ultra lancets and paid 28 cents for the lancets.  If she wants the delica lancets she will have to pay $15 because it is too early for a refill now.  Pharmacy tech states she will call patient and inform pt of price.  Pharmacy tech also states she has refills available for delica lancets. SChaplin, RN,BSN

## 2019-04-19 NOTE — Telephone Encounter (Signed)
Refill Request-  Pt states she does not use the One Touch Ultra that was called in and uses the one listed below:   ONETOUCH DELICA LANCETS [address], [place]

## 2019-05-04 ENCOUNTER — Encounter: Payer: Self-pay | Admitting: Internal Medicine

## 2019-05-05 ENCOUNTER — Telehealth: Payer: Self-pay | Admitting: *Deleted

## 2019-05-05 ENCOUNTER — Ambulatory Visit: Payer: Self-pay | Admitting: Internal Medicine

## 2019-05-05 NOTE — Telephone Encounter (Signed)
Thank you :)

## 2019-05-05 NOTE — Telephone Encounter (Signed)
Called pt to re-schedule her appt - no answer; message left to call the office.

## 2019-05-05 NOTE — Telephone Encounter (Signed)
-----   Message from [name], MD sent at 05/05/2019 11:38 AM EDT ----- Can you get Christine [name] rescheduled?  Would prefer in person if possible, or she can get labs and we can get her a telehealth visit.   Thanks!

## 2019-05-17 ENCOUNTER — Other Ambulatory Visit: Payer: Self-pay | Admitting: Internal Medicine

## 2019-05-17 NOTE — Telephone Encounter (Signed)
Stated her daughter was sick so she had to cancel her last appt and she had tried calling. Appt re-scheduled for first available- Aug 21 @ 0915 AM.

## 2019-05-18 ENCOUNTER — Other Ambulatory Visit: Payer: Self-pay | Admitting: Internal Medicine

## 2019-05-18 NOTE — Telephone Encounter (Signed)
Thank you :)

## 2019-05-24 ENCOUNTER — Telehealth: Payer: Self-pay | Admitting: *Deleted

## 2019-05-24 MED ORDER — VALSARTAN 320 MG PO TABS: 320.0000 mg | ORAL_TABLET | Freq: Every day | ORAL | 3 refills | Status: DC

## 2019-05-24 NOTE — Telephone Encounter (Signed)
Received fax from [facility] stating valsartan 160 mg is on backorder. Requesting permission to give patient 320 mg tabs, 1 tab daily. If so, please send new Rx for 320 mg tabs. Thank you. Hubbard Hartshorn, RN, BSN

## 2019-05-24 NOTE — Telephone Encounter (Signed)
That is fine, new Rx sent in.

## 2019-05-26 ENCOUNTER — Other Ambulatory Visit: Payer: Self-pay

## 2019-05-26 MED ORDER — CLOPIDOGREL BISULFATE 75 MG PO TABS: 75.0000 mg | ORAL_TABLET | Freq: Every evening | ORAL | 3 refills | Status: DC

## 2019-05-26 NOTE — Telephone Encounter (Signed)
clopidogrel (PLAVIX) 75 MG tablet, refill request @  [facility] [phone] [address], [place] [phone] (Phone) [phone] (Fax)

## 2019-06-08 ENCOUNTER — Other Ambulatory Visit: Payer: Self-pay | Admitting: Internal Medicine

## 2019-06-08 DIAGNOSIS — E039 Hypothyroidism, unspecified: Secondary | ICD-10-CM

## 2019-06-09 ENCOUNTER — Other Ambulatory Visit: Payer: Self-pay | Admitting: *Deleted

## 2019-06-09 ENCOUNTER — Other Ambulatory Visit: Payer: Self-pay

## 2019-06-09 DIAGNOSIS — E119 Type 2 diabetes mellitus without complications: Secondary | ICD-10-CM

## 2019-06-09 DIAGNOSIS — E039 Hypothyroidism, unspecified: Secondary | ICD-10-CM

## 2019-06-09 MED ORDER — LEVOTHYROXINE SODIUM 25 MCG PO TABS: 25.0000 ug | ORAL_TABLET | Freq: Every day | ORAL | 1 refills | Status: DC

## 2019-06-09 MED ORDER — ONETOUCH ULTRASOFT LANCETS MISC: 1 refills | Status: DC

## 2019-06-09 NOTE — Telephone Encounter (Signed)
Requesting to speak with a nurse about test strips. Please call pt back.

## 2019-06-09 NOTE — Telephone Encounter (Signed)
Addressed in new encounter, has been sent, was lancets not strips

## 2019-06-15 ENCOUNTER — Other Ambulatory Visit: Payer: Self-pay | Admitting: Internal Medicine

## 2019-06-15 NOTE — Telephone Encounter (Signed)
Pt needs refill on Lancets (ONETOUCH ULTRASOFT) lancets  ;pt contact [facility] [phone] - [place], [place]  Pt was checking sugar twice a day, pls write the prescription for twice a day

## 2019-06-15 NOTE — Telephone Encounter (Signed)
Pt needs refill on Lancets (ONETOUCH ULTRASOFT) lancets  ;pt contact [phone]

## 2019-06-15 NOTE — Telephone Encounter (Signed)
Script sent 7/22, called pharm, tech stated too soon because directions "left pharmacy to assume she only needed to test 1x daily" I ask that they should call md office if ever any question, tech was agreeable. States she cannot obtain strips for 20+ days, ask for pharmacist, pharmacist will override and pt may pick up today, most recent script furnished all correct information, the last 4 did not.

## 2019-06-28 ENCOUNTER — Telehealth: Payer: Self-pay

## 2019-06-28 NOTE — Telephone Encounter (Signed)
Pt's daughter requesting a letter for her job stating she cannot be around a lots of people due to her mother health. Please call back.

## 2019-06-30 ENCOUNTER — Encounter: Payer: Self-pay | Admitting: Internal Medicine

## 2019-06-30 NOTE — Telephone Encounter (Signed)
Letter written. Will leave for Ms. [name]

## 2019-06-30 NOTE — Telephone Encounter (Signed)
I called Christine [name] and will get a letter for her today.  She plans to pick it up tomorrow.

## 2019-06-30 NOTE — Telephone Encounter (Signed)
Pt's daughter requesting to speak with a nurse, stating she needs the letter for her job by Friday. Please call back.

## 2019-07-09 ENCOUNTER — Other Ambulatory Visit: Payer: Self-pay

## 2019-07-09 ENCOUNTER — Ambulatory Visit (INDEPENDENT_AMBULATORY_CARE_PROVIDER_SITE_OTHER): Payer: Medicare Other | Admitting: Internal Medicine

## 2019-07-09 ENCOUNTER — Encounter: Payer: Self-pay | Admitting: Internal Medicine

## 2019-07-09 VITALS — BP 140/51 | HR 63 | Temp 98.1°F | Ht 64.0 in | Wt 133.7 lb

## 2019-07-09 DIAGNOSIS — Z7902 Long term (current) use of antithrombotics/antiplatelets: Secondary | ICD-10-CM

## 2019-07-09 DIAGNOSIS — Z79899 Other long term (current) drug therapy: Secondary | ICD-10-CM | POA: Diagnosis not present

## 2019-07-09 DIAGNOSIS — G43809 Other migraine, not intractable, without status migrainosus: Secondary | ICD-10-CM

## 2019-07-09 DIAGNOSIS — E1122 Type 2 diabetes mellitus with diabetic chronic kidney disease: Secondary | ICD-10-CM

## 2019-07-09 DIAGNOSIS — E785 Hyperlipidemia, unspecified: Secondary | ICD-10-CM | POA: Diagnosis not present

## 2019-07-09 DIAGNOSIS — G43909 Migraine, unspecified, not intractable, without status migrainosus: Secondary | ICD-10-CM

## 2019-07-09 DIAGNOSIS — K219 Gastro-esophageal reflux disease without esophagitis: Secondary | ICD-10-CM

## 2019-07-09 DIAGNOSIS — I130 Hypertensive heart and chronic kidney disease with heart failure and stage 1 through stage 4 chronic kidney disease, or unspecified chronic kidney disease: Secondary | ICD-10-CM

## 2019-07-09 DIAGNOSIS — Z7984 Long term (current) use of oral hypoglycemic drugs: Secondary | ICD-10-CM | POA: Diagnosis not present

## 2019-07-09 DIAGNOSIS — I2510 Atherosclerotic heart disease of native coronary artery without angina pectoris: Secondary | ICD-10-CM

## 2019-07-09 DIAGNOSIS — R5382 Chronic fatigue, unspecified: Secondary | ICD-10-CM

## 2019-07-09 DIAGNOSIS — N183 Chronic kidney disease, stage 3 unspecified: Secondary | ICD-10-CM

## 2019-07-09 DIAGNOSIS — Z6822 Body mass index (BMI) 22.0-22.9, adult: Secondary | ICD-10-CM

## 2019-07-09 DIAGNOSIS — K623 Rectal prolapse: Secondary | ICD-10-CM

## 2019-07-09 DIAGNOSIS — E119 Type 2 diabetes mellitus without complications: Secondary | ICD-10-CM

## 2019-07-09 DIAGNOSIS — E43 Unspecified severe protein-calorie malnutrition: Secondary | ICD-10-CM

## 2019-07-09 DIAGNOSIS — M316 Other giant cell arteritis: Secondary | ICD-10-CM

## 2019-07-09 DIAGNOSIS — E039 Hypothyroidism, unspecified: Secondary | ICD-10-CM

## 2019-07-09 DIAGNOSIS — I25119 Atherosclerotic heart disease of native coronary artery with unspecified angina pectoris: Secondary | ICD-10-CM | POA: Diagnosis not present

## 2019-07-09 DIAGNOSIS — I5032 Chronic diastolic (congestive) heart failure: Secondary | ICD-10-CM

## 2019-07-09 DIAGNOSIS — I10 Essential (primary) hypertension: Secondary | ICD-10-CM

## 2019-07-09 LAB — POCT GLYCOSYLATED HEMOGLOBIN (HGB A1C): Hemoglobin A1C: 7.4 % — AB (ref 4.0–5.6)

## 2019-07-09 LAB — GLUCOSE, CAPILLARY: Glucose-Capillary: 125 mg/dL — ABNORMAL HIGH (ref 70–99)

## 2019-07-09 NOTE — Assessment & Plan Note (Signed)
She has no changes in urinary habits.  BP and DM are better controlled today.   Check BMET for updated Cr Follow closely, good BP and DM control are a must

## 2019-07-09 NOTE — Assessment & Plan Note (Signed)
She has used daily imipramine for a long period of time and has not tolerated weaning this.  She notes that she has not had a headache since last visit.   Plan Continue imipramine.

## 2019-07-09 NOTE — Progress Notes (Signed)
Subjective:    [name], female    DOB: 01/24/1934, 85 y.o.   MRN: [id number]  CC: 1 month follow up for temporal arteritis, diabetes  HPI  Christine [name] is an 85 year old woman with PMH of DM2, HTN, Temporal arteritis, hypothyroidism, HLD, CAD, GERD, CKD who presents for follow up.   Last visit was telehealth and she noted increased BP occasionally.  Today she brings in her BP report and she is doing very well.  Most blood pressures are < 140/80 and many are < 130/80.  She has occasional highs.  She is taking her medication as prescribed.  She denies any headache, chest pain, dizziness.  Her BP was initially 160/50 and on recheck was 140/61.  Her A1C today was 7.4 which is much improved from previous while she was on steroids.  She notes that she is having morning blood sugars ranging from the 70s - 160s, we reviewed her BS log.  She is not having any lows, shakes or issues.   She does not return of fatigue, which is only on some days.  She has no pain, no headaches, no jaw claudication.  She would like to check her Sed Rate since coming off the steroids which I think is reasonable.   Finally, she noted the rectal prolapse is problematic for her on some days. She has not been able to go to pelvic floor therapy due to the Pandemic.  She is frustrated but understands that surgery is likely not a good option either.  She is not taking loperamide and not having too frequent bowel movements.  Review of Systems  Constitutional: Positive for activity change and fatigue. Negative for chills, diaphoresis, fever and unexpected weight change.  Eyes: Negative for visual disturbance.  Respiratory: Positive for shortness of breath (occasional with walking).   Cardiovascular: Negative for chest pain and palpitations.  Gastrointestinal: Negative for abdominal distention and abdominal pain.  Genitourinary: Negative for difficulty urinating, dysuria and frequency.  Musculoskeletal: Negative  for arthralgias, back pain and gait problem.  Neurological: Negative for dizziness, weakness, light-headedness and headaches.  Psychiatric/Behavioral: Negative for decreased concentration and dysphoric mood.       Objective:   Physical Exam Constitutional:      General: She is not in acute distress.    Appearance: Normal appearance. She is not toxic-appearing.  HENT:     Head: Normocephalic and atraumatic.     Comments: No pain with jaw clenching.  Eyes:     Extraocular Movements: Extraocular movements intact.     Conjunctiva/sclera: Conjunctivae normal.     Pupils: Pupils are equal, round, and reactive to light.  Neck:     Musculoskeletal: Neck supple. No muscular tenderness.  Cardiovascular:     Rate and Rhythm: Normal rate and regular rhythm.     Pulses: Normal pulses.     Heart sounds: No murmur.  Pulmonary:     Effort: Pulmonary effort is normal. No respiratory distress.     Breath sounds: No wheezing.  Abdominal:     General: Bowel sounds are normal. There is no distension.     Palpations: Abdomen is soft.     Tenderness: There is no abdominal tenderness.  Musculoskeletal:        General: No deformity or signs of injury.  Skin:    General: Skin is warm and dry.     Coloration: Skin is not jaundiced.  Neurological:     Mental Status: She is alert  and oriented to person, place, and time. Mental status is at baseline.  Psychiatric:        Mood and Affect: Mood normal.        Thought Content: Thought content normal.    CMET, ESR today.   A1C 7.4    Assessment & Plan:  Return to clinic in 3 months, sooner if needed.

## 2019-07-09 NOTE — Assessment & Plan Note (Signed)
She continues to struggle with this issue.  Her GI doctor does not think she is a good surgical candidate because of recovery time.  Continue current therapy, restart pelvic floor PT when able.

## 2019-07-09 NOTE — Assessment & Plan Note (Signed)
BP was initially high, but improved on recheck.  Her home blood pressures were mostly at goal and under control.  She has no complaints of headache, chest pain, dizziness.   Plan Continue amlodipine, valsartan She has a history of hypertensive retinopathy so good control is paramount Check BMET for kidney function.

## 2019-07-09 NOTE — Assessment & Plan Note (Signed)
She is doing well off of prednisone.  She does not complain of any pain in her legs or head.  She does note some waxing and waning fatigue and is curious about her ESR.    Plan Continue to hold further steroids Check ESR

## 2019-07-09 NOTE — Patient Instructions (Signed)
Christine [name] - -  You are doing very well!  Please continue your medications as prescribed.   Please come back to see me in 3 months.  I will call you with the results of your blood work.   Thank you!

## 2019-07-09 NOTE — Assessment & Plan Note (Signed)
Her weight has stabilized.  Continue to monitor.  Check Albumin at next visit.

## 2019-07-09 NOTE — Assessment & Plan Note (Signed)
Much improved since being off of steroids.  A1C is now 7.4 which is at goal at her age.  Her LDL was last checked at 46, she is on a statin.  We did a foot exam today and she had no sign of neuropathy.  She has no retinopathy at last eye exam related to DM.  She has had no low blood sugars.  We reviewed her BS log.    Plan Continue glimepiride at current dose Check BMET for renal function, she has CKD stage 3

## 2019-07-09 NOTE — Assessment & Plan Note (Signed)
Grade 1, seen on TTE in 2018.  She has no symptoms, no swelling, no chest pain.  Continue good BP control and monitor.

## 2019-07-09 NOTE — Assessment & Plan Note (Signed)
She has no chest pain.  She is taking atorvastatin and plavix chronically.   Plan Continue atorvastatin and plavix LDL at last check was 46.

## 2019-07-10 LAB — CMP14 + ANION GAP
ALT: 15 IU/L (ref 0–32)
AST: 20 IU/L (ref 0–40)
Albumin/Globulin Ratio: 2 (ref 1.2–2.2)
Albumin: 4.5 g/dL (ref 3.6–4.6)
Alkaline Phosphatase: 192 IU/L — ABNORMAL HIGH (ref 39–117)
Anion Gap: 17 mmol/L (ref 10.0–18.0)
BUN/Creatinine Ratio: 16 (ref 12–28)
BUN: 17 mg/dL (ref 8–27)
Bilirubin Total: 0.2 mg/dL (ref 0.0–1.2)
CO2: 19 mmol/L — ABNORMAL LOW (ref 20–29)
Calcium: 9.5 mg/dL (ref 8.7–10.3)
Chloride: 100 mmol/L (ref 96–106)
Creatinine, Ser: 1.07 mg/dL — ABNORMAL HIGH (ref 0.57–1.00)
GFR calc Af Amer: 55 mL/min/{1.73_m2} — ABNORMAL LOW (ref >59)
GFR calc non Af Amer: 48 mL/min/{1.73_m2} — ABNORMAL LOW (ref >59)
Globulin, Total: 2.3 g/dL (ref 1.5–4.5)
Glucose: 134 mg/dL — ABNORMAL HIGH (ref 65–99)
Potassium: 4.7 mmol/L (ref 3.5–5.2)
Sodium: 136 mmol/L (ref 134–144)
Total Protein: 6.8 g/dL (ref 6.0–8.5)

## 2019-07-10 LAB — SEDIMENTATION RATE: Sed Rate: 37 mm/hr (ref 0–40)

## 2019-07-12 ENCOUNTER — Telehealth: Payer: Self-pay | Admitting: Internal Medicine

## 2019-07-12 NOTE — Telephone Encounter (Signed)
I called Christine [name] to discuss her lab work.  Confirmed identity with name and DOB.    She notes continued fatigue and not exercising as much.  We discussed her ESR which was within the normal range.  She would like to keep checking it semi-regularly, which I think is reasonable.  Since her leg pain, she has had decreased exercise tolerance and now has issues with walking.  I advised her to increase her exercise slowly.  I wonder if she might have PVD in the setting of h/o CAD as well.   She previously had ABIs in 2015 which were normal per report.  Consider rechecking in future if pain persists.   Follow up as scheduled, or sooner if needed for leg pain.   Gilles Chiquito, MD

## 2019-07-22 DIAGNOSIS — M13842 Other specified arthritis, left hand: Secondary | ICD-10-CM | POA: Diagnosis not present

## 2019-07-22 DIAGNOSIS — G5601 Carpal tunnel syndrome, right upper limb: Secondary | ICD-10-CM | POA: Diagnosis not present

## 2019-08-17 ENCOUNTER — Other Ambulatory Visit: Payer: Self-pay | Admitting: Internal Medicine

## 2019-08-17 DIAGNOSIS — F3342 Major depressive disorder, recurrent, in full remission: Secondary | ICD-10-CM

## 2019-08-19 ENCOUNTER — Other Ambulatory Visit: Payer: Self-pay | Admitting: Internal Medicine

## 2019-08-23 ENCOUNTER — Other Ambulatory Visit: Payer: Self-pay | Admitting: *Deleted

## 2019-08-23 DIAGNOSIS — J302 Other seasonal allergic rhinitis: Secondary | ICD-10-CM

## 2019-08-23 MED ORDER — FLUTICASONE PROPIONATE 50 MCG/ACT NA SUSP: 1.0000 | Freq: Every day | NASAL | 2 refills | Status: DC

## 2019-08-25 ENCOUNTER — Telehealth: Payer: Self-pay | Admitting: *Deleted

## 2019-08-25 ENCOUNTER — Telehealth: Payer: Self-pay

## 2019-08-25 NOTE — Telephone Encounter (Signed)
Call to [employer] for Insurance Coverage at [phone] for PA for One Touch Ultra Blue Test Strips.  To test twice daily.  Patient has an approval for testing  once daily that is goo until 05/14/2020.  Will need to call [facility] to get an updated file for the twice daily.  Spoke to the pharmacist at patient's pharmacy said that a new CMN Form will need to be completed for the change and asked if it could be written for 1 year of refills instead of 1. Pharmacy agreed to send over form to be completed for the change.  Sander Nephew, RN 08/25/2019 9:24 AM.

## 2019-08-25 NOTE — Telephone Encounter (Signed)
Thank you!  I will sign it once I get it!

## 2019-08-25 NOTE — Telephone Encounter (Signed)
"  The following medication records are no longer available for ordering. Place a new order with a different medication record. ONE TOUCH ULTRA TEST test strips"  I called [facility] - stated they will have their corporate office to send another Diabetic Detailed Written Order form.

## 2019-08-25 NOTE — Telephone Encounter (Signed)
ONE TOUCH ULTRA TEST test strip, refill request @  [facility] [phone] [address], [place] [phone] (Phone) [phone] (Fax)   Pt states the doctor needs to filled out a form for test strips. Please call pt back.

## 2019-08-27 ENCOUNTER — Other Ambulatory Visit: Payer: Self-pay | Admitting: Gastroenterology

## 2019-08-27 ENCOUNTER — Other Ambulatory Visit: Payer: Self-pay

## 2019-08-27 MED ORDER — ESOMEPRAZOLE MAGNESIUM 20 MG PO PACK: 20.0000 mg | PACK | Freq: Every day | ORAL | 0 refills | Status: DC

## 2019-08-27 NOTE — Telephone Encounter (Signed)
Patient has not discuss GERD in over 2 years with Dr Havery Moros. In addition, she has not had Nexium since 12/2017. I think this may require triaging. Note indicates patient wants nurse call.

## 2019-08-27 NOTE — Telephone Encounter (Signed)
Yes can see an APP, that is fine, thanks

## 2019-08-27 NOTE — Telephone Encounter (Signed)
Thanks for the update. She has had rectal prolapse in the past managed by pelvic flloor PT. If she has had overt prolapse recently it could be the cause. I would use the pelvic floor PT exercises she has been given in the past and monitor for now if this is new. If she wants to be seen we can try to get her an appointment next week (currently Friday afternoon). If over the weekend her symptoms worsen or she has significant bleeding she may need to go to the ED.

## 2019-08-27 NOTE — Telephone Encounter (Signed)
Called patient back and she states she had blood on her tissue last night after a BM and this morning she had a fair amount of blood on her pad. She is concerned about her rectal prolapse

## 2019-08-27 NOTE — Telephone Encounter (Signed)
Called patient back and she says she has still been doing the pelvic floor exercises some. She does not have protruding now. She is interested in an office visit with you, but next Friday you are not here. Were you wanting me to schedule her with an app.? She has not had anymore bleeding, but asked her to go to the ED if she has significant bleeding or symptoms worsen

## 2019-08-30 ENCOUNTER — Encounter: Payer: Self-pay | Admitting: Internal Medicine

## 2019-08-30 NOTE — Telephone Encounter (Signed)
Called patient and offered office visit with a PA for this Friday 09/03/19. Patient said she is feeling better and no more bleeding and she wants to wait and see Dr. Havery [name]. Scheduled on 10/05/19

## 2019-08-31 MED ORDER — FLUTICASONE-SALMETEROL 250-50 MCG/DOSE IN AEPB: INHALATION_SPRAY | RESPIRATORY_TRACT | 3 refills | Status: DC

## 2019-09-01 ENCOUNTER — Telehealth: Payer: Self-pay

## 2019-09-01 NOTE — Telephone Encounter (Signed)
Prior Auth for esomeprazole 20mg  capsules sent via CoverMyMeds. Patient has tried and failed pantoprazole only. May need to try another PPI if it is denied. Called and informed pt.  She indicated she will try what Dr. Havery Moros recommends.

## 2019-09-01 NOTE — Telephone Encounter (Signed)
Note: Patient's date of birth is [birth year] but it is listed as [id number] on certain documents such as insurance card.  Pt also has Medicare D - ID # [id number].

## 2019-09-02 NOTE — Telephone Encounter (Signed)
Prior auth approved for esomeprazole through 08-31-2020.

## 2019-09-02 NOTE — Addendum Note (Signed)
Addended by: Roetta Sessions on: 09/02/2019 02:10 PM   Modules accepted: Orders

## 2019-09-02 NOTE — Telephone Encounter (Signed)
Called and informed pt of PA approval for nexium generic

## 2019-09-11 DIAGNOSIS — Z23 Encounter for immunization: Secondary | ICD-10-CM | POA: Diagnosis not present

## 2019-09-14 ENCOUNTER — Other Ambulatory Visit: Payer: Self-pay | Admitting: Internal Medicine

## 2019-09-14 ENCOUNTER — Telehealth: Payer: Self-pay | Admitting: *Deleted

## 2019-09-14 DIAGNOSIS — I10 Essential (primary) hypertension: Secondary | ICD-10-CM

## 2019-09-14 NOTE — Telephone Encounter (Signed)
Pt calls worried stating that she was told by pharm that the diab info form had not been returned to the pharm. Triage called pharm, they have no record of rec'ing the form back, assured them that it was sent back. Pharmacist will call 3rd party and try to trace form, she will call triage back with f/u Assured pt it will be resolved today

## 2019-09-15 NOTE — Telephone Encounter (Signed)
Next appt scheduled 11/20 with PCP  

## 2019-09-30 DIAGNOSIS — M13842 Other specified arthritis, left hand: Secondary | ICD-10-CM | POA: Diagnosis not present

## 2019-09-30 DIAGNOSIS — G5601 Carpal tunnel syndrome, right upper limb: Secondary | ICD-10-CM | POA: Diagnosis not present

## 2019-10-05 ENCOUNTER — Ambulatory Visit: Payer: Medicare Other | Admitting: Gastroenterology

## 2019-10-07 ENCOUNTER — Telehealth: Payer: Self-pay | Admitting: Internal Medicine

## 2019-10-07 NOTE — Telephone Encounter (Signed)
Pt requested a callback [phone]

## 2019-10-07 NOTE — Telephone Encounter (Signed)
Just called patient to confirm her appt for tomorrow.  Informed me that she is waiting for a call back to see if she should come to her appt or stay home due to exposure to COVID-19?  Forwarding back to triage.

## 2019-10-07 NOTE — Telephone Encounter (Signed)
Spoke to dr Daryll Drown, pt rescheduled appt w/ dr Daryll Drown to 12/18 at 0945. If pt has s/s of COVID will suggest testing but pt may go to community testing if she is worried. Pt is agreeable with all.

## 2019-10-07 NOTE — Telephone Encounter (Signed)
Pt states states her grdaughter has been exposed to [place] by a student- sm child- pt has been around her grdaughter and grdaughter is afraid for pt, wants to know should she come to her appt in am? Can she be tested here if she comes for appt? She has no s/s of COVID

## 2019-10-08 ENCOUNTER — Ambulatory Visit: Payer: Medicare Other | Admitting: Internal Medicine

## 2019-11-05 ENCOUNTER — Other Ambulatory Visit: Payer: Self-pay

## 2019-11-05 ENCOUNTER — Encounter: Payer: Self-pay | Admitting: Internal Medicine

## 2019-11-05 ENCOUNTER — Ambulatory Visit (INDEPENDENT_AMBULATORY_CARE_PROVIDER_SITE_OTHER): Payer: Medicare Other | Admitting: Internal Medicine

## 2019-11-05 VITALS — BP 123/60 | HR 86 | Temp 98.3°F | Ht 64.0 in | Wt 131.6 lb

## 2019-11-05 DIAGNOSIS — Z885 Allergy status to narcotic agent status: Secondary | ICD-10-CM | POA: Diagnosis not present

## 2019-11-05 DIAGNOSIS — M19041 Primary osteoarthritis, right hand: Secondary | ICD-10-CM

## 2019-11-05 DIAGNOSIS — N183 Chronic kidney disease, stage 3 unspecified: Secondary | ICD-10-CM | POA: Diagnosis not present

## 2019-11-05 DIAGNOSIS — Z79899 Other long term (current) drug therapy: Secondary | ICD-10-CM | POA: Diagnosis not present

## 2019-11-05 DIAGNOSIS — I2510 Atherosclerotic heart disease of native coronary artery without angina pectoris: Secondary | ICD-10-CM

## 2019-11-05 DIAGNOSIS — E785 Hyperlipidemia, unspecified: Secondary | ICD-10-CM | POA: Diagnosis not present

## 2019-11-05 DIAGNOSIS — M316 Other giant cell arteritis: Secondary | ICD-10-CM

## 2019-11-05 DIAGNOSIS — E1122 Type 2 diabetes mellitus with diabetic chronic kidney disease: Secondary | ICD-10-CM | POA: Diagnosis present

## 2019-11-05 DIAGNOSIS — H35033 Hypertensive retinopathy, bilateral: Secondary | ICD-10-CM | POA: Diagnosis not present

## 2019-11-05 DIAGNOSIS — I471 Supraventricular tachycardia: Secondary | ICD-10-CM

## 2019-11-05 DIAGNOSIS — G5603 Carpal tunnel syndrome, bilateral upper limbs: Secondary | ICD-10-CM | POA: Diagnosis not present

## 2019-11-05 DIAGNOSIS — Z0000 Encounter for general adult medical examination without abnormal findings: Secondary | ICD-10-CM

## 2019-11-05 DIAGNOSIS — I10 Essential (primary) hypertension: Secondary | ICD-10-CM

## 2019-11-05 DIAGNOSIS — K219 Gastro-esophageal reflux disease without esophagitis: Secondary | ICD-10-CM

## 2019-11-05 DIAGNOSIS — I129 Hypertensive chronic kidney disease with stage 1 through stage 4 chronic kidney disease, or unspecified chronic kidney disease: Secondary | ICD-10-CM

## 2019-11-05 DIAGNOSIS — M8580 Other specified disorders of bone density and structure, unspecified site: Secondary | ICD-10-CM | POA: Diagnosis not present

## 2019-11-05 DIAGNOSIS — R011 Cardiac murmur, unspecified: Secondary | ICD-10-CM | POA: Diagnosis not present

## 2019-11-05 DIAGNOSIS — E119 Type 2 diabetes mellitus without complications: Secondary | ICD-10-CM

## 2019-11-05 DIAGNOSIS — E782 Mixed hyperlipidemia: Secondary | ICD-10-CM

## 2019-11-05 DIAGNOSIS — I25119 Atherosclerotic heart disease of native coronary artery with unspecified angina pectoris: Secondary | ICD-10-CM

## 2019-11-05 DIAGNOSIS — R911 Solitary pulmonary nodule: Secondary | ICD-10-CM

## 2019-11-05 DIAGNOSIS — M19042 Primary osteoarthritis, left hand: Secondary | ICD-10-CM

## 2019-11-05 DIAGNOSIS — Z7902 Long term (current) use of antithrombotics/antiplatelets: Secondary | ICD-10-CM | POA: Diagnosis not present

## 2019-11-05 DIAGNOSIS — E039 Hypothyroidism, unspecified: Secondary | ICD-10-CM

## 2019-11-05 DIAGNOSIS — M8589 Other specified disorders of bone density and structure, multiple sites: Secondary | ICD-10-CM

## 2019-11-05 LAB — GLUCOSE, CAPILLARY: Glucose-Capillary: 152 mg/dL — ABNORMAL HIGH (ref 70–99)

## 2019-11-05 LAB — POCT GLYCOSYLATED HEMOGLOBIN (HGB A1C): Hemoglobin A1C: 7.2 % — AB (ref 4.0–5.6)

## 2019-11-05 NOTE — Assessment & Plan Note (Signed)
Recommended that she follow up with Dr. Coral Else office.  Her BP is well controlled today.

## 2019-11-05 NOTE — Assessment & Plan Note (Signed)
Seen on DEXA in 2017.  Continue to take a multivitamin.  Check ca level on BMET today.

## 2019-11-05 NOTE — Assessment & Plan Note (Signed)
She has had no palpitations, no chest pain, no other issues today.  She is not on a rate controller.  We will continue to monitor.

## 2019-11-05 NOTE — Patient Instructions (Signed)
Christine [name] - -  Thank you for coming in to see me today.    For your chest pain, if this gets worse or comes on more often, please contact the clinic so that we can help you.   Please continue taking your medications as you are.   Please come back in 4-6 months.   Thank you!

## 2019-11-05 NOTE — Assessment & Plan Note (Signed)
No further follow up needed.  She is a never smoker.  No new symptoms today.

## 2019-11-05 NOTE — Assessment & Plan Note (Signed)
BP today is well controlled at 123/60.  She brought in her BP log to review and her BP is mostly well controlled with occasional spikes of systolic pressure to the [id number].  She is asymptomatic for these.  We will continue her current therapy and check a BMET today.   Plan Continue amlodipine and valsartan Check BMET

## 2019-11-05 NOTE — Progress Notes (Signed)
Subjective:    [name], female    DOB: 01/24/1934, 85 y.o.   MRN: [id number]  CC: 4 month follow up for DM and HTN  HPI  Christine [name] is an 85 year old woman with PMH of hypothyroidism, DM2 on glimepiride, HLD, HTN, H/O CAD, allergies, temporal arteritis, paroxysmal atrial tachycardia, CKD 3, GERD, osteopenia who presents for follow up.   Christine [name] has chronic arthritis in her hands which is related to CTS as well.  She has gotten injections in the past with her orthopedist, but she reports they don't seem to be lasting as long as previously.  She is wearing braces and using topical medicine and tylenol.  She has deformities associated with OA.   Otherwise, Christine [name] reports that she is doing well.  She is checking her CBG and BP and brings in a log for me to review.  Her BP is mostly controlled.  Her CBG highest in the mid 100s and her lowest was 85.  When low, she does not have any dizziness, shakiness or other symptoms.    She has a history of temporal arteritis and she has no complaints today.  She has some neck pain, but thinks this is arthritis.  She has no severe fatigue and no headaches.  She is off prednisone.  A1C today was 7.2 which is at goal for this woman.   Review of Systems  Constitutional: Positive for fatigue (occasional, but not severe). Negative for activity change and appetite change.  Respiratory: Negative for cough, choking, shortness of breath and wheezing.   Cardiovascular: Positive for chest pain (one episode, came on at rest, not tightness, not sharp, resolved on it's own in minutes). Negative for palpitations and leg swelling.  Gastrointestinal: Negative for abdominal pain, constipation and diarrhea.  Genitourinary: Negative for difficulty urinating, dysuria and hematuria.       She is up 2-3 times a night to urinate.  She was previously receiving pelvic floor therapy, but this has ceased due to the pandemic.   Musculoskeletal: Positive for  arthralgias, joint swelling and neck stiffness (neck arthritis per her). Negative for myalgias.  Neurological: Negative for dizziness, weakness, light-headedness and headaches.  Psychiatric/Behavioral: Positive for decreased concentration. Negative for dysphoric mood and hallucinations.       Objective:   Physical Exam Vitals and nursing note reviewed.  Constitutional:      General: She is not in acute distress.    Appearance: Normal appearance. She is normal weight. She is not toxic-appearing.  HENT:     Head: Normocephalic and atraumatic.  Eyes:     General:        Right eye: No discharge.        Left eye: No discharge.     Conjunctiva/sclera: Conjunctivae normal.  Cardiovascular:     Rate and Rhythm: Normal rate and regular rhythm.     Pulses: Normal pulses.     Heart sounds: Murmur present.  Pulmonary:     Effort: Pulmonary effort is normal. No respiratory distress.  Abdominal:     General: Abdomen is flat.     Palpations: Abdomen is soft.  Musculoskeletal:        General: Swelling (She has joint swelling in the hands, PIP and MCP.  She has tenderness to palpation at the [facility] bilaterally) and tenderness present. No signs of injury.     Cervical back: Neck supple. No rigidity or tenderness.     Right lower leg: No  edema.     Left lower leg: No edema.  Skin:    General: Skin is warm and dry.     Coloration: Skin is not pale.     Findings: No erythema.  Neurological:     General: No focal deficit present.     Mental Status: She is alert and oriented to person, place, and time. Mental status is at baseline.  Psychiatric:        Mood and Affect: Mood normal.        Behavior: Behavior normal.     Lipid panel, BMET, ESR today.       Assessment & Plan:  RTC in 4-6 months, sooner if needed.

## 2019-11-05 NOTE — Assessment & Plan Note (Signed)
She notes one episode of chest pain since last being seen.  Lasted a few minutes, came on during rest.  She is taking her plavix without issue.  She will monitor her symptoms and get in touch with [place] if symptoms become more common and if they become more severe.   Plan Continue good BP control Monitor for worsening symptoms.

## 2019-11-05 NOTE — Assessment & Plan Note (Signed)
Continues to cause issues for her.  We discussed her current regimen and I would not recommend NSAIDs given renal disease and would not recommend sedating medications.  Possibly could try tramadol in the future.  Advised continued use of bracing and topical medications.  She is also allergic to Codeine.

## 2019-11-05 NOTE — Assessment & Plan Note (Signed)
She is doing well on every other day esomeprazole.  She has no breakthrough symptoms, however, her recent episode of chest pain could feasibly be related to this issue.   Continue to monitor Continue every other day esomeprazole.

## 2019-11-05 NOTE — Assessment & Plan Note (Signed)
She is taking atorvastatin.  Check lipid profile today.

## 2019-11-05 NOTE — Assessment & Plan Note (Signed)
Christine [name] is doing well.  She is off of steroids and has had no recurrence of headache or achy thigh, shoulder pain.  Her last ESR was 37.  We will check an ESR today and may do so yearly.   Plan ESR.

## 2019-11-05 NOTE — Assessment & Plan Note (Signed)
She is 18 years olf  DEXA in 2017 showed osteopenia MMG in 2016 - has stopped screening Colonoscopy in 2019 - follows with GI for rectal prolapse Flu shot - completed this year Lung - never smoker

## 2019-11-05 NOTE — Assessment & Plan Note (Signed)
She has well controlled diabetes.  Her A1C goal is 7.5 - 8.0 given age and co-morbidities. Her A1C today is 7.2.  She is due for a lipid profile.  She takes atorvastatin 40mg  daily.  Her last LDL was 47.  She is due for an eye exam, she sees Dr. [place].  She has delayed this due to the pandemic.  She reports no blurry vision, no diplopia and no other issues with her eyes.  Checking BMET today to monitor her kidney function.  She has stage 3 CKD.  Continue good BP control.   Continue glimepiride (she has tried other medications, but prefers to remain on this.  We have discussed the risk of sulfonylureas).   Continue monitoring CBG BMET and Lipid profile today Encouraged to schedule for an eye exam.

## 2019-11-06 LAB — LIPID PANEL
Chol/HDL Ratio: 3.3 ratio (ref 0.0–4.4)
Cholesterol, Total: 145 mg/dL (ref 100–199)
HDL: 44 mg/dL (ref >39)
LDL Chol Calc (NIH): 76 mg/dL (ref 0–99)
Triglycerides: 142 mg/dL (ref 0–149)
VLDL Cholesterol Cal: 25 mg/dL (ref 5–40)

## 2019-11-06 LAB — BMP8+ANION GAP
Anion Gap: 17 mmol/L (ref 10.0–18.0)
BUN/Creatinine Ratio: 21 (ref 12–28)
BUN: 22 mg/dL (ref 8–27)
CO2: 22 mmol/L (ref 20–29)
Calcium: 9.8 mg/dL (ref 8.7–10.3)
Chloride: 100 mmol/L (ref 96–106)
Creatinine, Ser: 1.05 mg/dL — ABNORMAL HIGH (ref 0.57–1.00)
GFR calc Af Amer: 57 mL/min/{1.73_m2} — ABNORMAL LOW (ref >59)
GFR calc non Af Amer: 49 mL/min/{1.73_m2} — ABNORMAL LOW (ref >59)
Glucose: 113 mg/dL — ABNORMAL HIGH (ref 65–99)
Potassium: 4.5 mmol/L (ref 3.5–5.2)
Sodium: 139 mmol/L (ref 134–144)

## 2019-11-06 LAB — SEDIMENTATION RATE: Sed Rate: 50 mm/hr — ABNORMAL HIGH (ref 0–40)

## 2019-11-08 ENCOUNTER — Telehealth: Payer: Self-pay | Admitting: Dietician

## 2019-11-08 NOTE — Telephone Encounter (Signed)
I was asked to clarify the eye exam report received from Dr. Irven Shelling office. It read in one place that DFE (dilated fundus exam)  was performed and in another that DFE was not performed. The office informed me that dilation was preformed but that pictures were not taken ( their camera was not working) . It was a diabetes eye exam.

## 2019-11-09 ENCOUNTER — Encounter: Payer: Self-pay | Admitting: Internal Medicine

## 2019-11-15 ENCOUNTER — Telehealth: Payer: Self-pay | Admitting: Internal Medicine

## 2019-11-15 DIAGNOSIS — G43909 Migraine, unspecified, not intractable, without status migrainosus: Secondary | ICD-10-CM

## 2019-11-15 NOTE — Telephone Encounter (Signed)
RTC, pt is requesting pain medication for bilateral arthritis pain in her hands.  States Dr. Daryll Drown has given her Hydrocodone in the past (not on current med list).  Also wants refill on Zofran with the pain medicine if it is approved.  She is getting steroid injections with an orthopedic physician, but it is too soon for the next injection. Pt also requesting a referral to rheumatology.  She was referred last year to Dr. Trudie Reed on [facility], but doesn't like it there. Thank you, SChaplin, RN,BSN

## 2019-11-15 NOTE — Telephone Encounter (Signed)
Pt is requesting a nurse to callback [phone]

## 2019-11-16 MED ORDER — HYDROCODONE-ACETAMINOPHEN 5-325 MG PO TABS: 1.0000 | ORAL_TABLET | ORAL | 0 refills | Status: DC | PRN

## 2019-11-16 MED ORDER — ONDANSETRON HCL 4 MG PO TABS: 4.0000 mg | ORAL_TABLET | Freq: Three times a day (TID) | ORAL | 0 refills | Status: DC | PRN

## 2019-11-16 NOTE — Telephone Encounter (Signed)
Reviewed PDMP and previous notes.  She has gotten very intermittent dosing of hydrocodone.  Rx sent.  Refilled zofran.   Gilles Chiquito, MD

## 2019-11-17 ENCOUNTER — Other Ambulatory Visit: Payer: Self-pay | Admitting: *Deleted

## 2019-11-17 ENCOUNTER — Telehealth: Payer: Self-pay | Admitting: Internal Medicine

## 2019-11-17 DIAGNOSIS — G43909 Migraine, unspecified, not intractable, without status migrainosus: Secondary | ICD-10-CM

## 2019-11-17 MED ORDER — HYDROCODONE-ACETAMINOPHEN 5-325 MG PO TABS: 1.0000 | ORAL_TABLET | ORAL | 0 refills | Status: AC | PRN

## 2019-11-17 NOTE — Telephone Encounter (Signed)
Pt is requesting a call back [phone]

## 2019-11-17 NOTE — Telephone Encounter (Signed)
New encounter

## 2019-11-17 NOTE — Telephone Encounter (Signed)
Pt calls and states she did not get her pain med, reviewed script was set on call in not normal, sent refill request and set to normal

## 2019-11-30 ENCOUNTER — Other Ambulatory Visit: Payer: Self-pay | Admitting: Internal Medicine

## 2019-11-30 DIAGNOSIS — E039 Hypothyroidism, unspecified: Secondary | ICD-10-CM

## 2019-12-01 ENCOUNTER — Ambulatory Visit: Payer: Medicare Other | Attending: Internal Medicine

## 2019-12-01 DIAGNOSIS — Z23 Encounter for immunization: Secondary | ICD-10-CM

## 2019-12-01 NOTE — Progress Notes (Signed)
   Covid-19 Vaccination Clinic  Name:  Christine [name]    MRN: [id number] DOB: 01/24/1934  12/01/2019  [name] was observed post Covid-19 immunization for 30 minutes based on pre-vaccination screening without incidence. She was provided with Vaccine Information Sheet and instruction to access the V-Safe system.   [name] was instructed to call 911 with any severe reactions post vaccine: Marland Kitchen Difficulty breathing  . Swelling of your face and throat  . A fast heartbeat  . A bad rash all over your body  . Dizziness and weakness    Immunizations Administered    Name Date Dose VIS Date Route   Pfizer COVID-19 Vaccine 12/01/2019 10:19 AM 0.3 mL 10/29/2019 Intramuscular   Manufacturer: [employer], [employer]   Lot: [id number]   [place]: [id number]

## 2019-12-02 ENCOUNTER — Encounter: Payer: Self-pay | Admitting: *Deleted

## 2019-12-17 ENCOUNTER — Other Ambulatory Visit: Payer: Self-pay | Admitting: Internal Medicine

## 2019-12-20 ENCOUNTER — Telehealth: Payer: Self-pay | Admitting: *Deleted

## 2019-12-20 DIAGNOSIS — E119 Type 2 diabetes mellitus without complications: Secondary | ICD-10-CM

## 2019-12-20 MED ORDER — ONETOUCH ULTRA VI STRP: ORAL_STRIP | 3 refills | Status: DC

## 2019-12-20 NOTE — Telephone Encounter (Signed)
Ordered.   Gilles Chiquito, MD

## 2019-12-20 NOTE — Telephone Encounter (Signed)
Requesting a refill on ONE TOUCH ULTRA TEST test strip per [facility].

## 2019-12-21 ENCOUNTER — Ambulatory Visit: Payer: Medicare Other | Attending: Internal Medicine

## 2019-12-21 DIAGNOSIS — Z23 Encounter for immunization: Secondary | ICD-10-CM

## 2019-12-21 NOTE — Progress Notes (Signed)
   Covid-19 Vaccination Clinic  Name:  Christine [name]    MRN: [id number] DOB: 01/24/1934  12/21/2019  Christine [name] was observed post Covid-19 immunization for 15 minutes without incidence. She was provided with Vaccine Information Sheet and instruction to access the V-Safe system.   Christine [name] was instructed to call 911 with any severe reactions post vaccine: Marland Kitchen Difficulty breathing  . Swelling of your face and throat  . A fast heartbeat  . A bad rash all over your body  . Dizziness and weakness    Immunizations Administered    Name Date Dose VIS Date Route   Pfizer COVID-19 Vaccine 12/21/2019  9:08 AM 0.3 mL 10/29/2019 Intramuscular   Manufacturer: [place]   Lot: [id number]   [place]: [id number]

## 2019-12-25 ENCOUNTER — Encounter: Payer: Self-pay | Admitting: Internal Medicine

## 2019-12-25 DIAGNOSIS — M316 Other giant cell arteritis: Secondary | ICD-10-CM

## 2020-01-14 ENCOUNTER — Other Ambulatory Visit: Payer: Self-pay | Admitting: Internal Medicine

## 2020-01-14 ENCOUNTER — Other Ambulatory Visit: Payer: Self-pay | Admitting: *Deleted

## 2020-01-14 MED ORDER — FLUTICASONE-SALMETEROL 250-50 MCG/DOSE IN AEPB: INHALATION_SPRAY | RESPIRATORY_TRACT | 3 refills | Status: DC

## 2020-01-18 ENCOUNTER — Encounter: Payer: Self-pay | Admitting: Internal Medicine

## 2020-01-21 ENCOUNTER — Telehealth: Payer: Self-pay | Admitting: Internal Medicine

## 2020-01-21 NOTE — Telephone Encounter (Signed)
Patient is requesting call back at [phone] in reference to medicine, Advair.

## 2020-01-21 NOTE — Telephone Encounter (Signed)
Received TC from patient, states she went to pick up her advair and was told the doctor didn't want her to have it anymore.  Per med list, RX was approved on 01/14/20. TC to pharmacy, spoke with pharmacy tech, states they have to order the RX and it will be in on Monday. TC to patient and she was notified of above. SChaplin, RN,BSN

## 2020-01-25 ENCOUNTER — Other Ambulatory Visit: Payer: Self-pay | Admitting: *Deleted

## 2020-01-26 ENCOUNTER — Encounter: Payer: Self-pay | Admitting: *Deleted

## 2020-02-01 ENCOUNTER — Other Ambulatory Visit: Payer: Self-pay | Admitting: Internal Medicine

## 2020-02-01 DIAGNOSIS — E119 Type 2 diabetes mellitus without complications: Secondary | ICD-10-CM

## 2020-02-01 MED ORDER — GLIMEPIRIDE 1 MG PO TABS: ORAL_TABLET | ORAL | 3 refills | Status: DC

## 2020-02-01 NOTE — Telephone Encounter (Signed)
Pt states she took last pill this am. Also states she's in a lot pain (hands). Continues to wait on rheum appt. Thanks

## 2020-02-01 NOTE — Telephone Encounter (Signed)
Need refill on diabetes  the patient contact [place]   pls contact [phone]

## 2020-02-08 ENCOUNTER — Other Ambulatory Visit: Payer: Self-pay | Admitting: Internal Medicine

## 2020-02-08 DIAGNOSIS — E119 Type 2 diabetes mellitus without complications: Secondary | ICD-10-CM

## 2020-02-10 ENCOUNTER — Ambulatory Visit: Payer: Medicare Other | Admitting: Cardiovascular Disease

## 2020-02-14 ENCOUNTER — Other Ambulatory Visit: Payer: Self-pay | Admitting: Internal Medicine

## 2020-02-14 DIAGNOSIS — E785 Hyperlipidemia, unspecified: Secondary | ICD-10-CM

## 2020-02-15 NOTE — Telephone Encounter (Signed)
Atorva reduced from 40 to 20 12/13/2016 D/C. No explanation given. Will correct # dispensed to reflect 20mg  dosing

## 2020-02-15 NOTE — Telephone Encounter (Signed)
Next appt scheduled 5/21 with PCP.

## 2020-02-21 DIAGNOSIS — M19041 Primary osteoarthritis, right hand: Secondary | ICD-10-CM | POA: Diagnosis not present

## 2020-02-21 DIAGNOSIS — M5412 Radiculopathy, cervical region: Secondary | ICD-10-CM | POA: Diagnosis not present

## 2020-02-21 DIAGNOSIS — M50322 Other cervical disc degeneration at C5-C6 level: Secondary | ICD-10-CM | POA: Diagnosis not present

## 2020-02-21 DIAGNOSIS — M25541 Pain in joints of right hand: Secondary | ICD-10-CM | POA: Diagnosis not present

## 2020-02-21 DIAGNOSIS — M19042 Primary osteoarthritis, left hand: Secondary | ICD-10-CM | POA: Diagnosis not present

## 2020-02-21 DIAGNOSIS — M25542 Pain in joints of left hand: Secondary | ICD-10-CM | POA: Diagnosis not present

## 2020-02-23 ENCOUNTER — Telehealth (INDEPENDENT_AMBULATORY_CARE_PROVIDER_SITE_OTHER): Payer: Medicare Other | Admitting: Cardiology

## 2020-02-23 ENCOUNTER — Encounter: Payer: Self-pay | Admitting: Cardiology

## 2020-02-23 VITALS — BP 130/57 | HR 86 | Wt 129.0 lb

## 2020-02-23 DIAGNOSIS — E785 Hyperlipidemia, unspecified: Secondary | ICD-10-CM | POA: Diagnosis not present

## 2020-02-23 DIAGNOSIS — N183 Chronic kidney disease, stage 3 unspecified: Secondary | ICD-10-CM

## 2020-02-23 DIAGNOSIS — I2510 Atherosclerotic heart disease of native coronary artery without angina pectoris: Secondary | ICD-10-CM | POA: Diagnosis not present

## 2020-02-23 DIAGNOSIS — E119 Type 2 diabetes mellitus without complications: Secondary | ICD-10-CM

## 2020-02-23 DIAGNOSIS — I10 Essential (primary) hypertension: Secondary | ICD-10-CM

## 2020-02-23 DIAGNOSIS — E1122 Type 2 diabetes mellitus with diabetic chronic kidney disease: Secondary | ICD-10-CM

## 2020-02-23 DIAGNOSIS — M353 Polymyalgia rheumatica: Secondary | ICD-10-CM

## 2020-02-23 NOTE — Patient Instructions (Signed)
Medication Instructions:  Your physician recommends that you continue on your current medications as directed. Please refer to the Current Medication list given to you today.  *If you need a refill on your cardiac medications before your next appointment, please call your pharmacy*   Lab Work: none If you have labs (blood work) drawn today and your tests are completely normal, you will receive your results only by: Marland Kitchen MyChart Message (if you have MyChart) OR . A paper copy in the mail If you have any lab test that is abnormal or we need to change your treatment, we will call you to review the results.   Testing/Procedures: none   Follow-Up: At [facility], you and your health needs are our priority.  As part of our continuing mission to provide you with exceptional heart care, we have created designated Provider Care Teams.  These Care Teams include your primary Cardiologist (physician) and Advanced Practice Providers (APPs -  Physician Assistants and Nurse Practitioners) who all work together to provide you with the care you need, when you need it.  We recommend signing up for the patient portal called "MyChart".  Sign up information is provided on this After Visit Summary.  MyChart is used to connect with patients for Virtual Visits (Telemedicine).  Patients are able to view lab/test results, encounter notes, upcoming appointments, etc.  Non-urgent messages can be sent to your provider as well.   To learn more about what you can do with MyChart, go to [email or web address].    Your next appointment:   1 year(s)  The format for your next appointment:   In Person  Provider:   Skeet Latch, MD   Other Instructions

## 2020-02-23 NOTE — Progress Notes (Signed)
Virtual Visit via Telephone Note   This visit type was conducted due to national recommendations for restrictions regarding the COVID-19 Pandemic (e.g. social distancing) in an effort to limit this patient's exposure and mitigate transmission in our community.  Due to her co-morbid illnesses, this patient is at least at moderate risk for complications without adequate follow up.  This format is felt to be most appropriate for this patient at this time.  The patient did not have access to video technology/had technical difficulties with video requiring transitioning to audio format only (telephone).  All issues noted in this document were discussed and addressed.  No physical exam could be performed with this format.  Please refer to the patient's chart for her  consent to telehealth for [facility].   The patient was identified using 2 identifiers.  Date:  02/23/2020   ID:  Christine [name], DOB September 03, 1933, MRN [id number]  Patient Location: Home Provider Location: Home  PCP:  [name], MD  Cardiologist:  Skeet Latch, MD  Electrophysiologist:  None   Evaluation Performed:  Follow-Up Visit  Chief Complaint:  none  History of Present Illness:    Christine [name] is a 86 y.o. female with a history of coronary disease.  She has had prior catheterizations that have showed 40% left main, 70% diagonal.  Her last catheterization was in 2018.  She is not had significant chest pain.  Her last office visit with Dr. Oval Linsey was in September 2019.  Other medical issues include hypertension with a tendency towards orthostatic hypotension, chronotropic incompetence with beta-blocker intolerance, treated dyslipidemia, non-insulin-dependent diabetes, and PMR.  She has been followed by her primary care provider who sees her regularly.  The patient was contacted today for routine follow-up.  She is done well since she last saw [place].  Recent lab work showed an LDL of 76.  She has not had chest pain.  The  call today was somewhat difficult, the patient is hard of hearing, I would suggest in the future we do an office visit as opposed to a virtual visit.  The patient does not have symptoms concerning for COVID-19 infection (fever, chills, cough, or new shortness of breath).    Past Medical History:  Diagnosis Date  . Anemia   . Anginal pain ([place])   . Arthritis    "back, arms, hips; hands" (11/30/2015)  . Benign hypertensive heart and kidney disease with diastolic CHF, NYHA class II and CKD stage III ([place])   . Blood transfusion 1972   "after daughter born, attempted to give me blood; couldn't give it cause my blood was cold" (09/23/2013)  . CAD (coronary artery disease)    a. LHC 08/23/11: dLM 40-50%, oRI 40%, oCFX 40%, oD1 70% (small and not amenable to PCI).  LM lesion did not appear to be flow limiting.  Medical rx was recommended;  b. Echo 08/23/11: mild LVH, EF 60-65%, grade 1 diast dysfxn, mild BAE, PASP 24;  c. 02/2012  Cath: LM 50d, LAD 50p, D1 70ost, RI 70p, RCA ok->Med Rx;  d. 01/2013 Cardiolite: EF 88, no ischemia/infarct.  . Carotid stenosis    a. dopplers 10/12:  0-39% bilat ICA;  b. 10/2012 U/S: 0-39% bilat, f/u 1 yr (10/2013).  . Chronic bronchitis   . Depression   . Diverticulosis of colon (without mention of hemorrhage)   . GERD (gastroesophageal reflux disease)   . Heart murmur, systolic    2-D echo in December 2011 showed a normal EF  with grade 1 diastolic dysfunction, trivial pulmonary regurgitation and mildly elevated PA pressure at 37 mmHg probably secondary to her COPD.dynamic obstruction-mid cavity obliteration;  b. 02/2012 Echo: EF 60-65%, mild LVH, PASP 72mmHg.  Marland Kitchen History of stomach ulcers 1970's  . Hyperlipidemia   . Hypertension   . Hypothyroidism   . Migraines    a. Next atypical symptoms in the past. Patient was started on Neurontin for possible neuropathic origin of her pain.  . Polymyalgia rheumatica ([place])   . Shortness of breath on exertion    "just related to  angina >1 yr ago" (09/23/2013)  . Sinus arrhythmia   . Type II diabetes mellitus ([place])    a. On oral hypoglycemic agents.   Past Surgical History:  Procedure Laterality Date  . CARDIAC CATHETERIZATION N/A 09/26/2015   Procedure: Right/Left Heart Cath and Coronary Angiography;  Surgeon: Larey Dresser, MD;  Location: [facility];  Service: Cardiovascular;  Laterality: N/A;  . CATARACT EXTRACTION W/ INTRAOCULAR LENS  IMPLANT, BILATERAL Bilateral 1990's  . LEFT HEART CATHETERIZATION WITH CORONARY ANGIOGRAM N/A 03/16/2012   Procedure: LEFT HEART CATHETERIZATION WITH CORONARY ANGIOGRAM;  Surgeon: Hillary Bow, MD;  Location: [facility];  Service: Cardiovascular;  Laterality: N/A;  . RIGHT/LEFT HEART CATH AND CORONARY ANGIOGRAPHY N/A 08/12/2017   Procedure: RIGHT/LEFT HEART CATH AND CORONARY ANGIOGRAPHY;  Surgeon: Martinique, Peter M, MD;  Location: [facility];  Service: Cardiovascular;  Laterality: N/A;  . VAGINAL HYSTERECTOMY  1976     Current Meds  Medication Sig  . acetaminophen (TYLENOL) 500 MG tablet Take 1,000 mg by mouth every 6 (six) hours as needed for headache. For pain  . amLODipine (NORVASC) 10 MG tablet TAKE 1 TABLET BY MOUTH EVERY DAY  . Ascorbic Acid (VITAMIN C PO) Take 2 tablets by mouth daily.   Marland Kitchen atorvastatin (LIPITOR) 40 MG tablet TAKE 0.5 TABLETS BY MOUTH EVERY EVENING  . clopidogrel (PLAVIX) 75 MG tablet Take 1 tablet (75 mg total) by mouth every evening.  Marland Kitchen esomeprazole (NEXIUM) 20 MG packet Take 20 mg by mouth daily before breakfast. Take 1 capsule daily or every other day as needed  . fluticasone (FLONASE) 50 MCG/ACT nasal spray Place 1 spray into both nostrils daily.  . Fluticasone-Salmeterol (ADVAIR DISKUS) 250-50 MCG/DOSE AEPB INHALE 1 DOSE BY MOUTH EVERY 12 HOURS  . glimepiride (AMARYL) 1 MG tablet take 1 tablet by mouth once daily BEFORE BREAKFAST.  Marland Kitchen glucose blood (ONETOUCH ULTRA) test strip Use as instructed  . imipramine (TOFRANIL) 50 MG tablet  TAKE 1 TABLET BY MOUTH TWICE DAILY  . Lancets (ONETOUCH DELICA PLUS Q000111Q) MISC TEST TWICE DAILY TO CHECK BLOOD SUGAR AS DIRECTED  . levothyroxine (SYNTHROID) 25 MCG tablet TAKE 1 TABLET BY MOUTH EVERY DAY  . Multiple Vitamin (MULTIVITAMIN WITH MINERALS) TABS tablet Take 1 tablet by mouth daily. (Patient taking differently: Take 2 tablets by mouth daily. )  . polycarbophil (FIBERCON) 625 MG tablet Take 1 tablet (625 mg total) by mouth daily.  . valsartan (DIOVAN) 320 MG tablet Take 1 tablet (320 mg total) by mouth daily.  . [DISCONTINUED] ondansetron (ZOFRAN) 4 MG tablet Take 1 tablet (4 mg total) by mouth every 8 (eight) hours as needed for nausea or vomiting.     Allergies:   Aspirin, Atarax [hydroxyzine], Nsaids, Codeine, Haldol [haloperidol], Other, Penicillins, and Metoprolol   Social History   Tobacco Use  . Smoking status: Never Smoker  . Smokeless tobacco: Never Used  Substance Use Topics  .  Alcohol use: No    Alcohol/week: 0.0 standard drinks    Comment: 11/29/2016 "Last drink 1967"  . Drug use: No     Family Hx: The patient's family history includes COPD in her father; Colon cancer in her maternal grandmother; Heart attack in her maternal grandmother; Other in her mother; Pulmonary Hypertension in her daughter.  ROS:   Please see the history of present illness.    All other systems reviewed and are negative.   Prior CV studies:   The following studies were reviewed today: Cath sept 2018  Labs/Other Tests and Data Reviewed:    EKG:  An ECG dated 08/08/2018 was personally reviewed today and demonstrated:  NSR-HR 78, septal Qs  Recent Labs: 07/09/2019: ALT 15 11/05/2019: BUN 22; Creatinine, Ser 1.05; Potassium 4.5; Sodium 139   Recent Lipid Panel Lab Results  Component Value Date/Time   CHOL 145 11/05/2019 10:36 AM   TRIG 142 11/05/2019 10:36 AM   HDL 44 11/05/2019 10:36 AM   CHOLHDL 3.3 11/05/2019 10:36 AM   CHOLHDL 4 08/05/2014 09:33 AM   LDLCALC 76  11/05/2019 10:36 AM    Wt Readings from Last 3 Encounters:  02/23/20 129 lb (58.5 kg)  11/05/19 131 lb 9.6 oz (59.7 kg)  07/09/19 133 lb 11.2 oz (60.6 kg)     Objective:    Vital Signs:  BP (!) 130/57 (BP Location: Left Arm, Patient Position: Sitting, Cuff Size: Normal)   Pulse 86   Wt 129 lb (58.5 kg)   LMP 02/11/1975 (Approximate)   BMI 22.14 kg/m    VITAL SIGNS:  reviewed  ASSESSMENT & PLAN:    CAD- 40% LM, 75% Dx-last cath 2018- doing well on medical Rx.  HTN- With past orthostatic hypotension- currently doing well on present medications  Chronotropic incompetence Intolerant to beta blocker  HLD- LDL 76 Dec 2020 on statin rx  NIDDM- Per PCP  PMR- Sed rate 50 in Dec 2020  Plan- office visit one year  COVID-19 Education: The signs and symptoms of COVID-19 were discussed with the patient and how to seek care for testing (follow up with PCP or arrange E-visit).  The importance of social distancing was discussed today.  Time:   Today, I have spent 10 minutes with the patient with telehealth technology discussing the above problems.     Medication Adjustments/Labs and Tests Ordered: Current medicines are reviewed at length with the patient today.  Concerns regarding medicines are outlined above.   Tests Ordered: No orders of the defined types were placed in this encounter.   Medication Changes: No orders of the defined types were placed in this encounter.   Follow Up:  In Person Dr Oval Linsey in one year  Signed, Kerin Ransom, Hershal Coria  02/23/2020 [place]

## 2020-02-24 ENCOUNTER — Telehealth: Payer: Self-pay | Admitting: Internal Medicine

## 2020-02-24 MED ORDER — ONDANSETRON HCL 4 MG PO TABS: 4.0000 mg | ORAL_TABLET | Freq: Three times a day (TID) | ORAL | 0 refills | Status: DC | PRN

## 2020-02-24 NOTE — Telephone Encounter (Signed)
Pt requesting a call back .  Refill Request- Pt requesting a Nausea Medication be sent in to her pharmacy.   [facility] [id number] - [place], [place]

## 2020-02-24 NOTE — Telephone Encounter (Signed)
RTC to patient, states she went to her Rheumatologist on Monday and received a RX for Tylenol #3 which makes her nauseated.  She asked for a RX for Zofran, but was told she needed to request this from her PCP.  This medication has fallen off of her active med list and is listed un RX hx. Thank you, SChaplin, RN,BSN

## 2020-02-24 NOTE — Telephone Encounter (Signed)
Refill of Zofran sent.   Gilles Chiquito, MD

## 2020-03-06 DIAGNOSIS — M25531 Pain in right wrist: Secondary | ICD-10-CM | POA: Diagnosis not present

## 2020-03-06 DIAGNOSIS — R531 Weakness: Secondary | ICD-10-CM | POA: Diagnosis not present

## 2020-03-06 DIAGNOSIS — M79641 Pain in right hand: Secondary | ICD-10-CM | POA: Diagnosis not present

## 2020-03-06 DIAGNOSIS — Z7409 Other reduced mobility: Secondary | ICD-10-CM | POA: Diagnosis not present

## 2020-03-06 DIAGNOSIS — Z789 Other specified health status: Secondary | ICD-10-CM | POA: Diagnosis not present

## 2020-03-06 DIAGNOSIS — M5412 Radiculopathy, cervical region: Secondary | ICD-10-CM | POA: Diagnosis not present

## 2020-03-07 ENCOUNTER — Other Ambulatory Visit: Payer: Self-pay | Admitting: Internal Medicine

## 2020-03-07 DIAGNOSIS — F3342 Major depressive disorder, recurrent, in full remission: Secondary | ICD-10-CM

## 2020-03-08 ENCOUNTER — Other Ambulatory Visit: Payer: Self-pay

## 2020-03-08 MED ORDER — ESOMEPRAZOLE MAGNESIUM 20 MG PO PACK: 20.0000 mg | PACK | Freq: Every day | ORAL | 0 refills | Status: DC

## 2020-03-08 NOTE — Progress Notes (Signed)
Refill sent [facility]. Pt needs an appt

## 2020-03-15 DIAGNOSIS — R531 Weakness: Secondary | ICD-10-CM | POA: Diagnosis not present

## 2020-03-15 DIAGNOSIS — M25531 Pain in right wrist: Secondary | ICD-10-CM | POA: Diagnosis not present

## 2020-03-15 DIAGNOSIS — Z7409 Other reduced mobility: Secondary | ICD-10-CM | POA: Diagnosis not present

## 2020-03-15 DIAGNOSIS — Z789 Other specified health status: Secondary | ICD-10-CM | POA: Diagnosis not present

## 2020-03-15 DIAGNOSIS — M79641 Pain in right hand: Secondary | ICD-10-CM | POA: Diagnosis not present

## 2020-03-15 DIAGNOSIS — M5412 Radiculopathy, cervical region: Secondary | ICD-10-CM | POA: Diagnosis not present

## 2020-03-24 ENCOUNTER — Other Ambulatory Visit: Payer: Self-pay | Admitting: Internal Medicine

## 2020-03-24 ENCOUNTER — Other Ambulatory Visit: Payer: Self-pay

## 2020-03-24 DIAGNOSIS — E119 Type 2 diabetes mellitus without complications: Secondary | ICD-10-CM

## 2020-03-24 DIAGNOSIS — I10 Essential (primary) hypertension: Secondary | ICD-10-CM

## 2020-03-24 MED ORDER — AMLODIPINE BESYLATE 10 MG PO TABS: 10.0000 mg | ORAL_TABLET | Freq: Every day | ORAL | 3 refills | Status: DC

## 2020-03-24 MED ORDER — ONETOUCH ULTRA VI STRP: ORAL_STRIP | 3 refills | Status: DC

## 2020-03-24 NOTE — Telephone Encounter (Signed)
REFILL REQUEST   glucose blood (ONETOUCH ULTRA) test strip    [facility] [phone] - [place], [place] AT [place] [phone] (Phone) [phone] (Fax

## 2020-03-27 ENCOUNTER — Telehealth: Payer: Self-pay | Admitting: Gastroenterology

## 2020-03-27 MED ORDER — ESOMEPRAZOLE MAGNESIUM 20 MG PO PACK: 20.0000 mg | PACK | Freq: Every day | ORAL | 1 refills | Status: DC

## 2020-03-27 NOTE — Telephone Encounter (Signed)
Refill of Nexium 20 mg sent to pharmacy - #30 with 1 refill to get to June appt

## 2020-03-28 ENCOUNTER — Telehealth: Payer: Self-pay | Admitting: *Deleted

## 2020-03-28 NOTE — Telephone Encounter (Signed)
Call to [facility] concerning PA for One Touch Ultra Blue Tests Strips. Information was given concerning frequency.  Form to mailed to Dr Daryll Drown to sign and to return with changes.   Sander Nephew, RN 03/28/2020.  2:30 PM. \

## 2020-03-31 NOTE — Telephone Encounter (Signed)
Returned call to patient. States her daughter bought her some test strips so she is not out at present. Form is in PCP's box for signature. Hubbard Hartshorn, BSN, RN-BC

## 2020-03-31 NOTE — Telephone Encounter (Signed)
Christine [name] does not require checking more than once per day, have informed her multiple times of this.  Will fill out form on Monday.  Thank you

## 2020-03-31 NOTE — Telephone Encounter (Signed)
Pt is calling regarding test strips; pls contact [phone]

## 2020-04-03 ENCOUNTER — Other Ambulatory Visit: Payer: Self-pay

## 2020-04-03 MED ORDER — ESOMEPRAZOLE MAGNESIUM 20 MG PO CPDR: 20.0000 mg | DELAYED_RELEASE_CAPSULE | Freq: Every day | ORAL | 1 refills | Status: DC

## 2020-04-07 ENCOUNTER — Other Ambulatory Visit: Payer: Self-pay | Admitting: Internal Medicine

## 2020-04-07 ENCOUNTER — Ambulatory Visit: Payer: Medicare Other | Admitting: Internal Medicine

## 2020-04-10 ENCOUNTER — Other Ambulatory Visit: Payer: Self-pay | Admitting: Internal Medicine

## 2020-04-10 DIAGNOSIS — E119 Type 2 diabetes mellitus without complications: Secondary | ICD-10-CM

## 2020-04-10 NOTE — Telephone Encounter (Signed)
Refill  Request  glucose blood (ONETOUCH ULTRA) test strip  [id number] - [place], [place]

## 2020-04-11 MED ORDER — ONETOUCH ULTRA VI STRP: ORAL_STRIP | 3 refills | Status: DC

## 2020-04-14 ENCOUNTER — Telehealth: Payer: Self-pay | Admitting: *Deleted

## 2020-04-14 DIAGNOSIS — E119 Type 2 diabetes mellitus without complications: Secondary | ICD-10-CM

## 2020-04-14 MED ORDER — ONETOUCH ULTRA VI STRP: ORAL_STRIP | 3 refills | Status: DC

## 2020-04-14 MED ORDER — ONETOUCH DELICA PLUS LANCET30G MISC: 1.0000 [IU] | Freq: Every day | 11 refills | Status: DC

## 2020-04-14 NOTE — Telephone Encounter (Signed)
Orders sent.  Thank you!

## 2020-04-14 NOTE — Telephone Encounter (Signed)
Patient called stating she has been trying for 3 months to get her test strips.  Drugstore is telling her we are not sending over the authorization.  Please call with an answer as she is very concerned about this long delay.

## 2020-04-14 NOTE — Telephone Encounter (Addendum)
Test strips #200 with 3 refills sent 04/11/2020. Verified that [facility] has this Rx, however, it will not go through [facility] as it exceeds allowed amount. It will go through for #100 with once daily testing. Once daily testing is all that's required per PCP. Roselyn Reef at [facility] requesting new Rx with this new quantity/testing frequency and new Rx for lancets. Notified patient's daughter, [place] that PCP only recommends once daily testing AM fasting. Dembski will ensure her mom is aware of this. Hubbard Hartshorn, BSN, RN-BC

## 2020-04-18 ENCOUNTER — Telehealth: Payer: Self-pay | Admitting: *Deleted

## 2020-04-18 NOTE — Telephone Encounter (Signed)
Call to [facility] about need for further information about Glucose testing.  Form to be faxed for Dr. Daryll Drown to complete to be faxed back for Medicare.  Sander Nephew, RN 04/18/2020 9:26 AM.

## 2020-04-24 DIAGNOSIS — G5601 Carpal tunnel syndrome, right upper limb: Secondary | ICD-10-CM | POA: Diagnosis not present

## 2020-04-24 DIAGNOSIS — M542 Cervicalgia: Secondary | ICD-10-CM | POA: Diagnosis not present

## 2020-04-24 DIAGNOSIS — M19041 Primary osteoarthritis, right hand: Secondary | ICD-10-CM | POA: Diagnosis not present

## 2020-04-24 DIAGNOSIS — M19042 Primary osteoarthritis, left hand: Secondary | ICD-10-CM | POA: Diagnosis not present

## 2020-04-24 DIAGNOSIS — M4722 Other spondylosis with radiculopathy, cervical region: Secondary | ICD-10-CM | POA: Diagnosis not present

## 2020-04-24 DIAGNOSIS — E1159 Type 2 diabetes mellitus with other circulatory complications: Secondary | ICD-10-CM | POA: Diagnosis not present

## 2020-05-03 ENCOUNTER — Other Ambulatory Visit (INDEPENDENT_AMBULATORY_CARE_PROVIDER_SITE_OTHER): Payer: Medicare Other

## 2020-05-03 ENCOUNTER — Encounter: Payer: Self-pay | Admitting: Gastroenterology

## 2020-05-03 ENCOUNTER — Ambulatory Visit (INDEPENDENT_AMBULATORY_CARE_PROVIDER_SITE_OTHER): Payer: Medicare Other | Admitting: Gastroenterology

## 2020-05-03 VITALS — BP 122/60 | HR 76 | Ht 64.0 in | Wt 126.0 lb

## 2020-05-03 DIAGNOSIS — R7989 Other specified abnormal findings of blood chemistry: Secondary | ICD-10-CM

## 2020-05-03 DIAGNOSIS — R748 Abnormal levels of other serum enzymes: Secondary | ICD-10-CM | POA: Diagnosis not present

## 2020-05-03 DIAGNOSIS — K623 Rectal prolapse: Secondary | ICD-10-CM

## 2020-05-03 DIAGNOSIS — I2510 Atherosclerotic heart disease of native coronary artery without angina pectoris: Secondary | ICD-10-CM | POA: Diagnosis not present

## 2020-05-03 DIAGNOSIS — K219 Gastro-esophageal reflux disease without esophagitis: Secondary | ICD-10-CM | POA: Diagnosis not present

## 2020-05-03 LAB — HEPATIC FUNCTION PANEL
ALT: 13 U/L (ref 0–35)
AST: 19 U/L (ref 0–37)
Albumin: 4.2 g/dL (ref 3.5–5.2)
Alkaline Phosphatase: 154 U/L — ABNORMAL HIGH (ref 39–117)
Bilirubin, Direct: 0.2 mg/dL (ref 0.0–0.3)
Total Bilirubin: 0.4 mg/dL (ref 0.2–1.2)
Total Protein: 7 g/dL (ref 6.0–8.3)

## 2020-05-03 LAB — GAMMA GT: GGT: 27 U/L (ref 7–51)

## 2020-05-03 MED ORDER — FAMOTIDINE 20 MG PO TABS
20.0000 mg | ORAL_TABLET | Freq: Every day | ORAL | 3 refills | Status: DC | PRN
Start: 2020-05-03 — End: 2021-05-25

## 2020-05-03 NOTE — Progress Notes (Signed)
HPI :  86 year old female here for a follow-up visit for rectal prolapse and GERD.  She has had ongoing issues with rectal prolapse that have bothered her for a few years.  She had a colonoscopy with me in July 2019 which showed changes consistent with rectal prolapse.  She had one adenoma removed however prep was fair.  Biopsies negative for microscopic colitis.  She has been referred for pelvic floor PT in the past which she states has definitely helped her symptoms.  She has been doing better with this then when she was diagnosed with a prolapse however continues to have some occasional prolapse that bothers her.  It occurs with most bowel movements, but not every bowel movement.  She was taking some Tylenol 3 for joint pains which caused some constipation perhaps that made her symptoms bit worse.  Currently she is going every other day and using fiber Gummies daily which are working fairly well.  She previously had some incontinence and loose stools with this however has not had that issue lately.  Stools have been more formed.  She had followed up with pelvic floor PT at her last visit however had only a few sessions and then it was canceled due to COVID-19.  She is not followed up with them since then.  We discussed options.  She has otherwise been on Nexium a long time for her reflux symptoms.  She states she takes 20 mg every other day and that really controls her symptoms well.  She has no dysphagia.  No breakthrough.  She has a remote history of peptic ulcer disease in the 1970s.  She denies any NSAID use at all, no aspirin use.  She does take Plavix daily, no blood in her stools or bleeding symptoms.  She has normal renal function.  Discussed long-term options for management of her reflux.  Of note on review of labs historically she has had elevation in alkaline phosphatase with normal ALT and AST, last checked in 2020.  I have not seen follow-up for that since then.  Colonoscopy 05/27/2018 -  The digital rectal exam findings include decreased sphincter tone. - The terminal ileum appeared normal. 33m cecal polyp, 887mascending colon polyp, path shows one polyp adenomatous.  Multiple medium-mouthed diverticula were found in the sigmoid colon. The colon was tortuous. Changes in the rectum due to prolapse. Biopsies showed no microscopic colitis. 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 LHC 08/23/11: dLM 40-50%, oRI 40%, oCFX 40%, oD1 70% (small and not amenable to PCI).  LM lesion did not appear to be flow limiting.  Medical rx was recommended;  b. Echo 08/23/11: mild LVH, EF 60-65%, grade 1 diast dysfxn, mild BAE, PASP 24;  c. 02/2012  Cath: LM 50d, LAD 50p, D1 70ost, RI 70p, RCA ok->Med Rx;  d. 01/2013 Cardiolite: EF 88, no ischemia/infarct.   Carotid stenosis    a. dopplers 10/12:  0-39% bilat ICA;  b. 10/2012 U/S: 0-39% bilat, f/u 1 yr (10/2013).   Chronic bronchitis  Depression    Diverticulosis of colon (without mention of hemorrhage)    GERD (gastroesophageal reflux disease)    Heart murmur, systolic    2-D echo in December 2011 showed a normal EF with grade 1 diastolic dysfunction, trivial pulmonary regurgitation and mildly elevated PA pressure at 37 mmHg probably secondary to her COPD.dynamic obstruction-mid cavity obliteration;  b. 02/2012 Echo: EF 60-65%, mild LVH, PASP 52mHg.   History of stomach ulcers 1970's   Hyperlipidemia    Hypertension    Hypothyroidism    Migraines    a. Next atypical symptoms in the past. Patient was started on Neurontin for possible neuropathic  origin of her pain.   Polymyalgia rheumatica (HCC)    Shortness of breath on exertion    "just related to angina >1 yr ago" (09/23/2013)   Sinus arrhythmia    Type II diabetes mellitus ([facility]    a. On oral hypoglycemic agents.     Past Surgical History:  Procedure Laterality Date   CARDIAC CATHETERIZATION N/A 09/26/2015   Procedure: Right/Left Heart Cath and Coronary Angiography;  Surgeon: DLarey Dresser MD;  [facility];  Service: Cardiovascular;  Laterality: N/A;   CATARACT EXTRACTION W/ INTRAOCULAR LENS  IMPLANT, BILATERAL Bilateral 1990's   LEFT HEART CATHETERIZATION WITH CORONARY ANGIOGRAM N/A 03/16/2012   Procedure: LEFT HEART CATHETERIZATION WITH CORONARY ANGIOGRAM;  Surgeon: THillary Bow MD;  [facility];  Service: Cardiovascular;  Laterality: N/A;   RIGHT/LEFT HEART CATH AND CORONARY ANGIOGRAPHY N/A 08/12/2017   Procedure: RIGHT/LEFT HEART CATH AND CORONARY ANGIOGRAPHY;  Surgeon: JMartinique Peter M, MD;  [facility];  Service: Cardiovascular;  Laterality: N/A;   VAGINAL HYSTERECTOMY  1976   Family History  Problem Relation Age of Onset   Colon cancer Maternal Grandmother    Heart attack Maternal Grandmother    COPD Father    Other Mother        died of unknown causes in her 256's Pt raised by grandmother.   Pulmonary Hypertension Daughter    Social History   Tobacco Use   Smoking status: Never Smoker   Smokeless tobacco: Never Used  Vaping Use   Vaping Use: Never used  Substance Use Topics   Alcohol use: No    Alcohol/week: 0.0 standard drinks    Comment: 11/29/2016 "Last drink 1967"   Drug use: No   Current Outpatient Medications  Medication Sig Dispense Refill   acetaminophen (TYLENOL) 500 MG tablet Take 1,000 mg by mouth every 6 (six) hours as needed for headache. For pain     acetaminophen-codeine (TYLENOL #3) 300-30 MG tablet SMARTSIG:1 Tablet(s) By Mouth Every 12 Hours PRN     amLODipine  (NORVASC) 10 MG tablet Take 1 tablet (10 mg total) by mouth daily. 90 tablet 3   Ascorbic Acid (VITAMIN C PO) Take 2 tablets by mouth daily.      atorvastatin (LIPITOR) 40 MG tablet TAKE 0.5 TABLETS BY MOUTH EVERY EVENING 45 tablet 3   clopidogrel (PLAVIX) 75 MG tablet Take 1 tablet (75 mg total) by mouth every evening. 90 tablet 3   esomeprazole (NEXIUM) 20 MG capsule Take 1 capsule (20 mg total) by mouth daily before breakfast. 30 capsule 1   fluticasone (FLONASE) 50 MCG/ACT nasal spray Place 1 spray into both nostrils daily. 16 g 2   Fluticasone-Salmeterol (ADVAIR DISKUS) 250-50 MCG/DOSE AEPB INHALE 1 DOSE BY MOUTH EVERY 12 HOURS 60 each 3   glimepiride (AMARYL) 1 MG tablet take  1 tablet by mouth once daily BEFORE BREAKFAST. 90 tablet 3   glucose blood (ONETOUCH ULTRA) test strip Use once daily to check blood sugar before breakfast. 100 each 3   imipramine (TOFRANIL) 50 MG tablet TAKE 1 TABLET BY MOUTH TWICE DAILY 180 tablet 1   Lancets (ONETOUCH DELICA PLUS JOINOM76H) MISC Inject 1 Units into the skin daily. 100 each 11   levothyroxine (SYNTHROID) 25 MCG tablet TAKE 1 TABLET BY MOUTH EVERY DAY 90 tablet 1   Multiple Vitamin (MULTIVITAMIN WITH MINERALS) TABS tablet Take 1 tablet by mouth daily. (Patient taking differently: Take 2 tablets by mouth daily. ) 30 tablet 2   ondansetron (ZOFRAN) 4 MG tablet Take 1 tablet (4 mg total) by mouth every 8 (eight) hours as needed for nausea or vomiting. 20 tablet 0   valsartan (DIOVAN) 320 MG tablet Take 1 tablet (320 mg total) by mouth daily. 90 tablet 3   No current facility-administered medications for this visit.   Allergies  Allergen Reactions   Aspirin Swelling and Other (See Comments)    Angioedema   Atarax [Hydroxyzine] Other (See Comments)    psychosis   Nsaids Other (See Comments)    "interacts with heart medications"   Codeine Nausea And Vomiting and Other (See Comments)    "makes me deathly sick" - can take with nausea  medicine    Haldol [Haloperidol] Other (See Comments)    EPS - rigidity and dystonic reaction, very sensitive   Other Other (See Comments)    MSG   Penicillins Diarrhea    "real bad" Has patient had a PCN reaction causing immediate rash, facial/tongue/throat swelling, SOB or lightheadedness with hypotension: Yes Has patient had a PCN reaction causing severe rash involving mucus membranes or skin necrosis: No Has patient had a PCN reaction that required hospitalization No Has patient had a PCN reaction occurring within the last 10 years: Yes If all of the above answers are "NO", then may proceed with Cephalosporin use.    Metoprolol     Chronotropic incompetence     Review of Systems: All systems reviewed and negative except where noted in HPI.   Lab Results  Component Value Date   WBC 8.1 03/07/2018   HGB 12.0 03/07/2018   HCT 37.7 03/07/2018   MCV 88.7 03/07/2018   PLT 373 03/07/2018    Lab Results  Component Value Date   CREATININE 1.05 (H) 11/05/2019   BUN 22 11/05/2019   NA 139 11/05/2019   K 4.5 11/05/2019   CL 100 11/05/2019   CO2 22 11/05/2019    Lab Results  Component Value Date   ALT 15 07/09/2019   AST 20 07/09/2019   GGT 33 09/17/2016   ALKPHOS 192 (H) 07/09/2019   BILITOT 0.2 07/09/2019     Physical Exam: BP 122/60    Pulse 76    Ht 5' 4"  (1.626 m)    Wt 126 lb (57.2 kg)    LMP 02/11/1975 (Approximate)    BMI 21.63 kg/m  Constitutional: Pleasant,well-developed, female in no acute distress. Neurological: Alert and oriented to person place and time. Skin: Skin is warm and dry. No rashes noted. Psychiatric: Normal mood and affect. Behavior is normal.   ASSESSMENT AND PLAN: 86 year old female here for reassessment the following:  Rectal prolapse - has benefited from pelvic floor PT in the past however last year when referred back she had her therapy cut short due to COVID-19.  She has been taking a fiber supplement and  bowels have been moving  fairly regularly.  She continues to have intermittent prolapse but is definitely better than it has been when first diagnosed.  We discussed options moving forward.  Definitive therapy would be surgical and we want avoid that if at all possible given her age.  I do think she may have some continued benefit with additional pelvic floor PT and will refer her back to them to see if any additional gain can can be achieved.  Patient agreed with this plan and can follow-up with me as needed.  GERD - longstanding Nexium use every other day which is working really well but we discussed options moving forward in light of long-term risks of PPI use.  She has a remote history of peptic ulcer disease in 1970s per her report, she is not using any NSAIDs at this time, on Plavix monotherapy.  We discussed long-term risks / benefits of Nexium versus H2 blockers.  She wants to try Pepcid 20 mg a day and see how that goes if she stops the Nexium.  If we need to go back to Nexium at some point that is fine if she fails Pepcid.  We will see how she does and she can contact me with questions moving forward.  Elevated alk phos - noted on review of labs last year with normal AST and ALT, will repeat LFTs to see where this is trending and obtain a GGT as well to clarify source, further work-up if needed.  [place] Cellar, MD [facility]

## 2020-05-03 NOTE — Patient Instructions (Addendum)
If you are age 84 or older, your body mass index should be between 23-30. Your Body mass index is 21.63 kg/m. If this is out of the aforementioned range listed, please consider follow up with your Primary Care Provider.  If you are age 33 or younger, your body mass index should be between 19-25. Your Body mass index is 21.63 kg/m. If this is out of the aformentioned range listed, please consider follow up with your Primary Care Provider.   Please go to the lab in the basement of our building to have lab work done as you leave today. Hit "B" for basement when you get on the elevator.  When the doors open the lab is on your left.  We will call you with the results. Thank you.   We will refer you to Pelvic Floor Physical Therapy. They will call you to schedule an appointment.  Discontinue Nexium.  We have sent the following medications to your pharmacy for you to pick up at your convenience: Pepcid (famotidine) 20 mg: Take once daily as needed  Continue a daily fiber supplement.  Thank you for entrusting me with your care and for choosing [facility], Dr. [place] Cellar

## 2020-05-08 ENCOUNTER — Telehealth: Payer: Self-pay | Admitting: *Deleted

## 2020-05-08 NOTE — Telephone Encounter (Signed)
Received fax from Dr Doyne Keel office (GI) - labs done 05/03/20. Elevated alkaline phosphatase- 154. Lab results  are in Epic. Thanks

## 2020-05-09 NOTE — Telephone Encounter (Signed)
Thank you :)

## 2020-05-11 ENCOUNTER — Telehealth: Payer: Self-pay

## 2020-05-11 NOTE — Telephone Encounter (Signed)
I spoke with Christine [name], she denies any recent chest pain or worsening dyspnea. Main issue is significant back pain which limit her functional ability. Once hear back from Dr. Oval Linsey, we will forward final clearance to the surgeon's office

## 2020-05-11 NOTE — Telephone Encounter (Signed)
   [facility] Pre-operative Risk Assessment    HEARTCARE STAFF: - Please ensure there is not already an duplicate clearance open for this procedure. - Under Visit Info/Reason for Call, type in Other and utilize the format Clearance MM/DD/YY or Clearance TBD. Do not use dashes or single digits. - If request is for dental extraction, please clarify the # of teeth to be extracted.  Request for surgical clearance:  1. What type of surgery is being performed? Epidural steroid injection   2. When is this surgery scheduled? TBD   3. What type of clearance is required (medical clearance vs. Pharmacy clearance to hold med vs. Both)? pharmacy  4. Are there any medications that need to be held prior to surgery and how long?Plavix-7 days prior   5. Practice name and name of physician performing surgery? [facility]   6. What is the office phone number? [phone]   7.   What is the office fax number? [phone]  8.   Anesthesia type (None, local, MAC, general) ? Not specified   Meryl Crutch 05/11/2020, 10:10 AM  _________________________________________________________________   (provider comments below)

## 2020-05-11 NOTE — Telephone Encounter (Signed)
Patient returning call.

## 2020-05-11 NOTE — Telephone Encounter (Signed)
Left message for the patient to call back and speak to the preop APP for clearance. Last cath was in 2018 which showed 40% LM and 70% diagonal. Medical therapy was recommended. Dr. Oval Linsey to review, ok to hold plavix from your perspective as long as she is chest pain free?  Please send your response to P CV DIV PREOP

## 2020-05-12 ENCOUNTER — Ambulatory Visit: Payer: Medicare Other | Admitting: Internal Medicine

## 2020-05-17 NOTE — Telephone Encounter (Signed)
OK to hold Plavix for surgery 

## 2020-05-18 ENCOUNTER — Other Ambulatory Visit: Payer: Self-pay | Admitting: Internal Medicine

## 2020-05-19 NOTE — Telephone Encounter (Signed)
   Primary Cardiologist: Skeet Latch, MD  Chart reviewed as part of pre-operative protocol coverage. Given past medical history and time since last visit, based on ACC/AHA guidelines, Christine [name] would be at acceptable risk for the planned procedure without further cardiovascular testing.   She may hold Plavix for 5 days prior to her surgery.  Please resume as soon as hemostasis is achieved.  I will route this recommendation to the requesting party via Epic fax function and remove from pre-op pool.  Please call with questions.  Jossie Ng. Shatana Saxton NP-C    05/19/2020, 9:57 AM [place] [address] Office [phone] Fax [phone]

## 2020-05-30 ENCOUNTER — Other Ambulatory Visit: Payer: Self-pay | Admitting: Internal Medicine

## 2020-05-30 DIAGNOSIS — J302 Other seasonal allergic rhinitis: Secondary | ICD-10-CM

## 2020-05-30 DIAGNOSIS — E039 Hypothyroidism, unspecified: Secondary | ICD-10-CM

## 2020-06-05 ENCOUNTER — Encounter: Payer: Self-pay | Admitting: Physical Therapy

## 2020-06-05 ENCOUNTER — Ambulatory Visit: Payer: Medicare Other | Attending: Gastroenterology | Admitting: Physical Therapy

## 2020-06-05 ENCOUNTER — Other Ambulatory Visit: Payer: Self-pay

## 2020-06-05 DIAGNOSIS — K623 Rectal prolapse: Secondary | ICD-10-CM

## 2020-06-05 DIAGNOSIS — M6281 Muscle weakness (generalized): Secondary | ICD-10-CM

## 2020-06-05 DIAGNOSIS — R279 Unspecified lack of coordination: Secondary | ICD-10-CM | POA: Insufficient documentation

## 2020-06-05 NOTE — Therapy (Signed)
[facility] [address] [place], [place], [address] Phone: [phone]   Fax:  [phone]  Physical Therapy Evaluation  Patient Details  Name: Christine [name] MRN: [id number] Date of Birth: 01/24/1934 Referring Provider (PT): Dr. Bassett Cellar   Encounter Date: 06/05/2020   PT End of Session - 06/05/20 0909    Visit Number 1    Date for PT Re-Evaluation 08/28/20    Authorization Type Medicare/Medicaid    PT Start Time 0850    PT Stop Time 0925    PT Time Calculation (min) 35 min    Activity Tolerance Patient tolerated treatment well    Behavior During Therapy [facility] for tasks assessed/performed           Past Medical History:  Diagnosis Date  . Anemia   . Anginal pain ([place])   . Arthritis    "back, arms, hips; hands" (11/30/2015)  . Benign hypertensive heart and kidney disease with diastolic CHF, NYHA class II and CKD stage III ([place])   . Blood transfusion 1972   "after daughter born, attempted to give me blood; couldn't give it cause my blood was cold" (09/23/2013)  . CAD (coronary artery disease)    a. LHC 08/23/11: dLM 40-50%, oRI 40%, oCFX 40%, oD1 70% (small and not amenable to PCI).  LM lesion did not appear to be flow limiting.  Medical rx was recommended;  b. Echo 08/23/11: mild LVH, EF 60-65%, grade 1 diast dysfxn, mild BAE, PASP 24;  c. 02/2012  Cath: LM 50d, LAD 50p, D1 70ost, RI 70p, RCA ok->Med Rx;  d. 01/2013 Cardiolite: EF 88, no ischemia/infarct.  . Carotid stenosis    a. dopplers 10/12:  0-39% bilat ICA;  b. 10/2012 U/S: 0-39% bilat, f/u 1 yr (10/2013).  . Chronic bronchitis   . Depression   . Diverticulosis of colon (without mention of hemorrhage)   . GERD (gastroesophageal reflux disease)   . Heart murmur, systolic    2-D echo in December 2011 showed a normal EF with grade 1 diastolic dysfunction, trivial pulmonary regurgitation and mildly elevated PA pressure at 37 mmHg probably secondary to her  COPD.dynamic obstruction-mid cavity obliteration;  b. 02/2012 Echo: EF 60-65%, mild LVH, PASP 46mmHg.  Marland Kitchen History of stomach ulcers 1970's  . Hyperlipidemia   . Hypertension   . Hypothyroidism   . Migraines    a. Next atypical symptoms in the past. Patient was started on Neurontin for possible neuropathic origin of her pain.  . Polymyalgia rheumatica ([place])   . Shortness of breath on exertion    "just related to angina >1 yr ago" (09/23/2013)  . Sinus arrhythmia   . Type II diabetes mellitus ([place])    a. On oral hypoglycemic agents.    Past Surgical History:  Procedure Laterality Date  . CARDIAC CATHETERIZATION N/A 09/26/2015   Procedure: Right/Left Heart Cath and Coronary Angiography;  Surgeon: Larey Dresser, MD;  Location: [place] [facility];  Service: Cardiovascular;  Laterality: N/A;  . CATARACT EXTRACTION W/ INTRAOCULAR LENS  IMPLANT, BILATERAL Bilateral 1990's  . LEFT HEART CATHETERIZATION WITH CORONARY ANGIOGRAM N/A 03/16/2012   Procedure: LEFT HEART CATHETERIZATION WITH CORONARY ANGIOGRAM;  Surgeon: Hillary Bow, MD;  Location: [facility];  Service: Cardiovascular;  Laterality: N/A;  . RIGHT/LEFT HEART CATH AND CORONARY ANGIOGRAPHY N/A 08/12/2017   Procedure: RIGHT/LEFT HEART CATH AND CORONARY ANGIOGRAPHY;  Surgeon: Martinique, Peter M, MD;  Location: [place] [facility];  Service: Cardiovascular;  Laterality: N/A;  . VAGINAL HYSTERECTOMY  1976    There were no vitals filed for this visit.    Subjective Assessment - 06/05/20 0853    Subjective The prolapse is not better or worse. I have not been able to do the exercises consistent due to the arthritis all over. I had 2 days and 1 night the prolapse stayed in.    Patient Stated Goals reduce her prolapse    Currently in Pain? Yes    Pain Score 3     Pain Location Rectum    Pain Orientation Mid    Pain Descriptors / Indicators Discomfort    Pain Type Chronic pain    Pain Onset More than a month ago    Pain Frequency  Intermittent    Aggravating Factors  after a shower, shen walk alot    Pain Relieving Factors get the prolapse in and lay down, pain medication    Multiple Pain Sites No              [facility] PT Assessment - 06/05/20 0001      Assessment   Medical Diagnosis K62.3 REctal prolapse; K21.9 Gatroesphageal reflux disease, unspedfied whether  esphagitis present    Referring Provider (PT) Dr. [place] Cellar    Onset Date/Surgical Date 05/18/18    Prior Therapy yes      Precautions   Precautions None      Restrictions   Weight Bearing Restrictions No      Balance Screen   Has the patient fallen in the past 6 months No    Has the patient had a decrease in activity level because of a fear of falling?  No    Is the patient reluctant to leave their home because of a fear of falling?  No      Home Environment   Living Environment Private residence    Living Arrangements Children    Available Help at Discharge Family;Available PRN/intermittently    Type of Home House    Home Access Stairs to enter    Entrance Stairs-Number of Steps 2    Entrance Stairs-Rails None    Home Layout Two level;Bed/bath upstairs      Prior Function   Level of Independence Independent    Vocation Retired      Associate Professor   Overall Cognitive Status Within Functional Limits for tasks assessed      Posture/Postural Control   Posture/Postural Control No significant limitations      ROM / Strength   AROM / PROM / Strength PROM;AROM;Strength      PROM   Right Hip External Rotation  50    Left Hip External Rotation  40      Strength   Right Hip Extension 4/5    Right Hip External Rotation  5/5    Right Hip ABduction 3/5    Left Hip Extension 3/5    Left Hip External Rotation 5/5    Left Hip ABduction 4/5    Right Knee Flexion 4/5    Right Knee Extension 4/5    Left Knee Flexion 4/5    Left Knee Extension 4/5                      Objective measurements completed on examination: See  above findings.     Pelvic Floor Special Questions - 06/05/20 0001    Urinary Leakage No    Urinary frequency 2-3 pads per day, none at night, does  not always feels the leakage,     Fecal incontinence Yes   strain,    Prolapse Other    Prolapse other rectal, significant, 3 inches out    Pelvic Floor Internal Exam Patient comfirms identification and approves PT to assess pelvic floor and treatment    Exam Type Rectal    Palpation most of contraction is the puborectalis, compensate with the gluteal, anterior of EAS and IAS has no contraction, tightness in the perineal body, patient will hold her breath     Strength weak squeeze, no lift                    PT Education - 06/05/20 0927    Education Details Pelvic floor contraction in sidely    Person(s) Educated Patient    Methods Explanation;Demonstration;Verbal cues;Handout    Comprehension Returned demonstration;Verbalized understanding            PT Short Term Goals - 06/05/20 0910      PT SHORT TERM GOAL #1   Title independent with initial HEP with pelvic floor contraction    Time 4    Period Weeks    Status New    Target Date 07/03/20      PT SHORT TERM GOAL #2   Title abitliy to contract the pelvic floor and lower abdominal to improve continence    Time 4    Period Weeks    Status New    Target Date 07/03/20      PT SHORT TERM GOAL #3   Title understand correct toilet technique to fully evacuate his bowels    Time 4    Period Weeks    Status New    Target Date 07/03/20      PT SHORT TERM GOAL #4   Title understand bowel health    Time 4    Period Weeks    Status New    Target Date 07/03/20             PT Long Term Goals - 06/05/20 0911      PT LONG TERM GOAL #1   Title independent with HEP and understand how to progress herself    Baseline --    Time 12    Period Weeks    Status New    Target Date 08/28/20      PT LONG TERM GOAL #2   Title improved fecal leakage >/= 50% due to  improved strength    Baseline --    Time 8    Period Weeks    Status New    Target Date 08/28/20      PT LONG TERM GOAL #3   Title rectal prolpase reduce 50% of the time due to imrpoved pelvic floor strength >/= 3/5 holding or 10 seconds    Baseline --    Time 12    Period Weeks    Status New    Target Date 08/28/20      PT LONG TERM GOAL #4   Title understand ways to manage rectal prolpase     Time 12    Period Weeks    Status New    Target Date 08/28/20                  Plan - 06/05/20 0912    Clinical Impression Statement Patient is a 86 year old female with significant rectal prolapse that is able to be reduced in therapy. Patient reports her intermittent  rectal pain is 3/10. Patient reports she has fecal leakage 2-3 times per day and wears 2-3 pads per day but none at night. Pelvic floor strength is 2/5 with no contraction on the anterior portion of the anal sphincter muscle. Puborectalis is able to come forward with contraction. As patient contracts the spincter muscles she will contract the gluteal and  hold her breath. She is only able to contraction 2-3 seconds. She will feel the prolapse when sitting, laying on back, and walking. Patient will benefit from skilled therapy to improve pelvic floor strength, educate on how to manage rectal prolapse adn toileting techniques.    Personal Factors and Comorbidities Comorbidity 3+;Age;Fitness;Time since onset of injury/illness/exacerbation    Comorbidities diabetes, polymyalgia rheumatica; vaginal hysterectomy; heart murmur; Hypothyroidism    Examination-Activity Limitations Continence;Sit;Locomotion Level;Transfers    Examination-Participation Restrictions Community Activity    Stability/Clinical Decision Making Evolving/Moderate complexity    Clinical Decision Making Moderate    Clinical Impairments Affecting Rehab Potential Polymyalgia rhumatica; osteopenia; diverticulosis; hypothyroidism, diabetes; vaginal hysterectomy;  cardiac catherization    PT Frequency 1x / week    PT Duration 12 weeks    PT Treatment/Interventions Biofeedback;Therapeutic exercise;Therapeutic activities;Neuromuscular re-education;Patient/family education;Manual techniques;Passive range of motion;Energy conservation    PT Next Visit Plan pelvic floor strength in sidely with tactile cues for cueing muscles for circular contraction, toileting technique, bowel health    Consulted and Agree with Plan of Care Patient           Patient will benefit from skilled therapeutic intervention in order to improve the following deficits and impairments:  Increased fascial restricitons, Pain, Decreased coordination, Increased muscle spasms, Decreased activity tolerance, Decreased endurance, Decreased strength, Impaired flexibility  Visit Diagnosis: Muscle weakness (generalized) - Plan: PT plan of care cert/re-cert  Unspecified lack of coordination - Plan: PT plan of care cert/re-cert  Rectal prolapse - Plan: PT plan of care cert/re-cert     Problem List Patient Active Problem List   Diagnosis Date Noted  . Chronic diastolic (congestive) heart failure ([place]) 01/13/2019  . PAT (paroxysmal atrial tachycardia) ([place]) 01/13/2019  . CKD stage 3 due to type 2 diabetes mellitus ([place]) 01/13/2019  . Rectal prolapse 06/24/2018  . Temporal arteritis ([place]) 04/06/2018  . Chronic cough 10/30/2017  . MDD (major depressive disorder), recurrent, in full remission ([place]) 11/30/2016  . Protein-calorie malnutrition, severe 11/30/2016  . Elevated alkaline phosphatase level 09/18/2016  . Hypertensive retinopathy of both eyes, grade 2 04/20/2015  . Lumbar spondylosis 06/16/2014  . Carpal tunnel syndrome 02/18/2014  . Osteopenia 01/11/2013  . Routine adult health maintenance 01/11/2013  . Diverticulosis 09/18/2012  . Allergic rhinitis 02/13/2012  . Coronary artery disease 08/29/2011  . HEART MURMUR, SYSTOLIC 63/84/6659  . Enlargement of clavicle 03/30/2008  .  Solitary pulmonary nodule 12/15/2006  . Hyperlipidemia 11/28/2006  . Hypothyroidism 09/04/2006  . Type 2 diabetes mellitus, controlled ([place]) 09/04/2006  . Migraine 09/04/2006  . Essential hypertension 09/04/2006  . GERD 09/04/2006  . PMR (polymyalgia rheumatica) ([facility]) 09/04/2006    Earlie Counts, PT 06/05/20 10:48 AM   [facility] [address] [place], [place], [address] Phone: [phone]   Fax:  [phone]  Name: Christine [name] MRN: [id number] Date of Birth: 01/24/1934

## 2020-06-05 NOTE — Patient Instructions (Addendum)
Slow Contraction: Gravity Eliminated (Side-Lying)    Lie on left side, hips and knees slightly bent. Slowly squeeze pelvic floor for _3__ seconds. Rest for _3__ seconds. Repeat _5__ times. Do _4__ times a day.   Copyright  VHI. All rights reserved.  [facility] [address], [place], [place] [address] Phone # [phone] Fax [phone]

## 2020-06-06 DIAGNOSIS — M5412 Radiculopathy, cervical region: Secondary | ICD-10-CM | POA: Diagnosis not present

## 2020-06-07 ENCOUNTER — Other Ambulatory Visit: Payer: Self-pay | Admitting: Internal Medicine

## 2020-06-08 ENCOUNTER — Other Ambulatory Visit: Payer: Self-pay | Admitting: Internal Medicine

## 2020-06-12 ENCOUNTER — Other Ambulatory Visit: Payer: Self-pay

## 2020-06-12 ENCOUNTER — Ambulatory Visit: Payer: Medicare Other | Admitting: Physical Therapy

## 2020-06-12 ENCOUNTER — Encounter: Payer: Self-pay | Admitting: Physical Therapy

## 2020-06-12 DIAGNOSIS — R279 Unspecified lack of coordination: Secondary | ICD-10-CM | POA: Diagnosis not present

## 2020-06-12 DIAGNOSIS — K623 Rectal prolapse: Secondary | ICD-10-CM | POA: Diagnosis not present

## 2020-06-12 DIAGNOSIS — M6281 Muscle weakness (generalized): Secondary | ICD-10-CM | POA: Diagnosis not present

## 2020-06-12 NOTE — Patient Instructions (Addendum)
Introduction to Bowel Health Diet and daily habits can help you predict when your bowels will move on a regular basis.  The consistency and quantity of the stool is usually more important than the frequency.  The goal is to have a regular bowel movement that is soft but formed.   Tips on Emptying Regularly . Eat breakfast.  Usually the best time of day for a bowel movement will be a half hour to an hour after eating.  These times are best because the body uses the gastrocolic reflex, a stimulation of bowel motion that occurs with eating, to help produce a bowel movement.  For some people even a simple hot drink in the morning can help the reflex action begin. . Eat all your meals at a predictable time each day.  The bowel functions best when food is introduced at the same regular intervals. . The amount of food eaten at a given time of day should be about the same size from day to day.  The bowel functions best when food is introduced in similar quantities from day to day. It is fine to have a small breakfast and a large lunch, or vice versa, just be consistent. . Eat two servings of fruit or vegetables and at least one serving of a complex carbohydrates (whole grains such as brown rice, bran, whole wheat bread, or oatmeal) at each meal. . Drink plenty of water--ideally eight glasses a day.  Be sure to increase your water intake if you are increasing fiber into your diet.  Maintain Healthy Habits . Exercise daily.  You may exercise at any time of day, but you may find that bowel function is helped most if the exercise is at a consistent time each day. . Make sure that you are not rushed and have convenient access to a bathroom at your selected time to empty your bowels. .   Toileting Techniques for Bowel Movements    An Evacuation/Defecation Plan   Here are the 4 basic points:  1. Lean forward enough for your elbows to rest on your knees 2. Support your feet on the floor or use a low stool if  your feet don't touch the floor  3. Push out your belly as if you have swallowed a beach ball--you should feel a widening of your waist. "Belly Big, Belly Hard" 4. Open and relax your pelvic floor muscles, rather than tightening around the anus  While you are sitting on the toilet pay attention to the following areas: . Jaw and mouth position- relaxed not clenched . Angle of your hips - leaning slightly forward . Whether your feet touch the ground or not - should be flat and supported . Arm placement - rest against your thighs . Spine position - flat back . Waist . Breathing - exhale as you push (like blowing up a balloon or try using other sounds such as ahhhh, shhhhh, ohhhh or grrrrrrr) . Belly - hard and tight as you push . Anus (opening of the anal canal) - relaxed and open as you push . Anus - Tighten and lift pulling the muscle back in after you are done or if taking a break  If you are not successful after 10-15 minutes, try again later.  Avoid negative self-talk about your toileting experience.   Read this for more details and ask your PT if you need suggestions for adjustments or limitations:  1) Sitting on the toilet  a) Make sure your feet are supported -  flat on the floor or step stool b) Many people find it effective to lean forward or raise their knees.  Propping your feet on a step stool (squatty potty is a brand name) can help the muscles around the anus to relax  c) When you lean forward, place your forearms on your thighs for support  2) Relaxing a) Breathe deeply and slowly in through your nose and out through your mouth. b) To become aware of how to relax your muscles, contracting and releasing muscles can be helpful.  Pull your pelvic floor muscles in tightly by using the image of holding back gas, or closing around the anus (visualize making a circle smaller) and lifting the anus up and in.  Then release the muscles and your anus should drop down and feel open.  Repeat 5 times ending with the feeling of relaxation. c) Keep your pelvic floor muscles relaxed; let your belly bulge out. d) The digestive tract starts at the mouth and ends at the anal opening, so be sure to relax both ends of the tube.  Place your tongue on the roof of your mouth with your teeth separated.  This helps relax your mouth and will help to relax the anus at the same time.  3) Emptying (defecation) a) Keep your pelvic floor and sphincter relaxed, then bulge your anal muscles.  Make the anal opening wide.  b) Stick your belly out as if you have swallowed a beach ball. c) Make your belly wall hard using your belly muscles while continuing to breathe. Doing this makes it easier to open your anus. d) Breath out and give a grunt (or try using other sounds such as ahhhh, shhhhh, ohhhh or grrrrrrr). e)  Can also try to act as if you are blowing up a balloon as you push  4) Finishing a) As you finish your bowel movement, pull the pelvic floor muscles up and in.  This will leave your anus in the proper place rather than remaining pushed out and down. If you leave your anus pushed out and down, it will start to feel as though that is normal and give you incorrect signals about needing to have a bowel movement.   Lay down with a pillow under knees and hips to relax the pelvic floor 2 times per day for 5-10 minutes.   Adduction: Hip - Knees Together With Pelvic Floor (Side-Lying)    Lie on left side, hips and knees slightly bent, towel roll between knees. Squeeze pelvic floor while pushing knees together 50%. Hold for _5__ seconds. Rest for _10__ seconds. Repeat _10__ times. Do __3_ times a day.   Copyright  VHI. All rights reserved.  Adduction: Hip - Knees Together With Pelvic Floor (Side-Lying)    Lay on your right side with feet on the headboard. Knees and hips at 90 degrees.  Draw your stomach up and in while pushing feet gently into the headboard.  Hold 5 sec, 10x , 1x per  day  Copyright  VHI. All rights reserved.  [facility] [address] [place], [place] [address] Phone # [phone] Fax [phone]

## 2020-06-12 NOTE — Therapy (Signed)
[facility] [address] [place], [place], [address] Phone: [phone]   Fax:  [phone]  Physical Therapy Treatment  Patient Details  Name: Christine [name] MRN: [id number] Date of Birth: 01/24/1934 Referring Provider (PT): Dr. Milford Cellar   Encounter Date: 06/12/2020   PT End of Session - 06/12/20 1058    Visit Number 2    Date for PT Re-Evaluation 08/28/20    Authorization Type Medicare/Medicaid    Authorization - Visit Number 1    Authorization - Number of Visits 10    Activity Tolerance Patient tolerated treatment well    Behavior During Therapy [facility] for tasks assessed/performed           Past Medical History:  Diagnosis Date  . Anemia   . Anginal pain ([place])   . Arthritis    "back, arms, hips; hands" (11/30/2015)  . Benign hypertensive heart and kidney disease with diastolic CHF, NYHA class II and CKD stage III ([place])   . Blood transfusion 1972   "after daughter born, attempted to give me blood; couldn't give it cause my blood was cold" (09/23/2013)  . CAD (coronary artery disease)    a. LHC 08/23/11: dLM 40-50%, oRI 40%, oCFX 40%, oD1 70% (small and not amenable to PCI).  LM lesion did not appear to be flow limiting.  Medical rx was recommended;  b. Echo 08/23/11: mild LVH, EF 60-65%, grade 1 diast dysfxn, mild BAE, PASP 24;  c. 02/2012  Cath: LM 50d, LAD 50p, D1 70ost, RI 70p, RCA ok->Med Rx;  d. 01/2013 Cardiolite: EF 88, no ischemia/infarct.  . Carotid stenosis    a. dopplers 10/12:  0-39% bilat ICA;  b. 10/2012 U/S: 0-39% bilat, f/u 1 yr (10/2013).  . Chronic bronchitis   . Depression   . Diverticulosis of colon (without mention of hemorrhage)   . GERD (gastroesophageal reflux disease)   . Heart murmur, systolic    2-D echo in December 2011 showed a normal EF with grade 1 diastolic dysfunction, trivial pulmonary regurgitation and mildly elevated PA pressure at 37 mmHg probably secondary to her  COPD.dynamic obstruction-mid cavity obliteration;  b. 02/2012 Echo: EF 60-65%, mild LVH, PASP 59mmHg.  Marland Kitchen History of stomach ulcers 1970's  . Hyperlipidemia   . Hypertension   . Hypothyroidism   . Migraines    a. Next atypical symptoms in the past. Patient was started on Neurontin for possible neuropathic origin of her pain.  . Polymyalgia rheumatica ([place])   . Shortness of breath on exertion    "just related to angina >1 yr ago" (09/23/2013)  . Sinus arrhythmia   . Type II diabetes mellitus ([place])    a. On oral hypoglycemic agents.    Past Surgical History:  Procedure Laterality Date  . CARDIAC CATHETERIZATION N/A 09/26/2015   Procedure: Right/Left Heart Cath and Coronary Angiography;  Surgeon: Larey Dresser, MD;  Location: [place] [facility];  Service: Cardiovascular;  Laterality: N/A;  . CATARACT EXTRACTION W/ INTRAOCULAR LENS  IMPLANT, BILATERAL Bilateral 1990's  . LEFT HEART CATHETERIZATION WITH CORONARY ANGIOGRAM N/A 03/16/2012   Procedure: LEFT HEART CATHETERIZATION WITH CORONARY ANGIOGRAM;  Surgeon: Hillary Bow, MD;  Location: [facility] CATH LAB;  Service: Cardiovascular;  Laterality: N/A;  . RIGHT/LEFT HEART CATH AND CORONARY ANGIOGRAPHY N/A 08/12/2017   Procedure: RIGHT/LEFT HEART CATH AND CORONARY ANGIOGRAPHY;  Surgeon: Martinique, Peter M, MD;  Location: [place] [facility];  Service: Cardiovascular;  Laterality: N/A;  . VAGINAL HYSTERECTOMY  1976    There were no vitals filed for this visit.   Subjective Assessment - 06/12/20 1019    Subjective NO changes since the last visit. Has Type 4 BM when she is on the commode and in the pad it is smooshed into the pad.    Patient Stated Goals reduce her prolapse    Currently in Pain? Yes    Pain Score 3     Pain Location Rectum    Pain Orientation Mid    Pain Descriptors / Indicators Discomfort    Pain Type Chronic pain    Pain Onset More than a month ago    Pain Frequency Intermittent    Aggravating Factors  after a shower, when she  walks alot    Pain Relieving Factors get the prolapse in and lay down, pain medication    Multiple Pain Sites No                             [facility] Adult PT Treatment/Exercise - 06/12/20 0001      Self-Care   Self-Care Other Self-Care Comments    Other Self-Care Comments  education on bowel health, education on laying on back with hips elevated to relax the pelvic floor and reduce the prolapse      Therapeutic Activites    Therapeutic Activities Other Therapeutic Activities    Other Therapeutic Activities education on toileting technique to use a squatty potty, correct breathing technique to not strain the pelvic floor and diaphragmatic breathing      Lumbar Exercises: Sidelying   Other Sidelying Lumbar Exercises right sidely with squeeze the pillow and contract the pelvic floor for 5 sec and rest for 10 sec, 10x    Other Sidelying Lumbar Exercises right sidely with hips at 90/90 and pressing feet into wall and TA contraction holding 5 sec 10x                  PT Education - 06/12/20 1056    Education Details Bowel health, toileting techinique; pelvic floor contraction in sidely, laying supine with hips elevated to reduce prolapse; gluteal contraction with TA in sidely    Person(s) Educated Patient    Methods Explanation;Demonstration;Verbal cues;Handout    Comprehension Returned demonstration;Verbalized understanding            PT Short Term Goals - 06/12/20 1105      PT SHORT TERM GOAL #1   Title independent with initial HEP with pelvic floor contraction    Time 4    Period Weeks    Status On-going      PT SHORT TERM GOAL #2   Title abitliy to contract the pelvic floor and lower abdominal to improve continence    Time 4    Period Weeks    Status On-going    Target Date 07/03/20      PT SHORT TERM GOAL #3   Title understand correct toilet technique to fully evacuate his bowels    Time 4    Period Weeks    Status On-going    Target Date  07/03/20      PT SHORT TERM GOAL #4   Title understand bowel health    Time 4    Period Weeks    Status Achieved             PT Long Term Goals - 06/05/20 0911      PT LONG TERM GOAL #1  Title independent with HEP and understand how to progress herself    Baseline --    Time 12    Period Weeks    Status New    Target Date 08/28/20      PT LONG TERM GOAL #2   Title improved fecal leakage >/= 50% due to improved strength    Baseline --    Time 8    Period Weeks    Status New    Target Date 08/28/20      PT LONG TERM GOAL #3   Title rectal prolpase reduce 50% of the time due to imrpoved pelvic floor strength >/= 3/5 holding or 10 seconds    Baseline --    Time 12    Period Weeks    Status New    Target Date 08/28/20      PT LONG TERM GOAL #4   Title understand ways to manage rectal prolpase     Time 12    Period Weeks    Status New    Target Date 08/28/20                 Plan - 06/12/20 1059    Clinical Impression Statement Patient has learned about bowel health to improve her bowel movements. She has Type 4 bowel movements. The stool on her pads are smooshed into the pads. Patient has learned how to contract her pelvic floor with hip adduction. Patient has learned how to lay with hips elevated to reduce the prolapse. Patient will benefit from skilled therapy to improve pelvic floor strength, educate on how to manage rectal prolapse and toileting techniques.    Personal Factors and Comorbidities Comorbidity 3+;Age;Fitness;Time since onset of injury/illness/exacerbation    Comorbidities diabetes, polymyalgia rheumatica; vaginal hysterectomy; heart murmur; Hypothyroidism    Examination-Activity Limitations Continence;Sit;Locomotion Level;Transfers    Examination-Participation Restrictions Community Activity    Stability/Clinical Decision Making Evolving/Moderate complexity    Rehab Potential Good    Clinical Impairments Affecting Rehab Potential Polymyalgia  rhumatica; osteopenia; diverticulosis; hypothyroidism, diabetes; vaginal hysterectomy; cardiac catherization    PT Frequency 1x / week    PT Treatment/Interventions Biofeedback;Therapeutic exercise;Therapeutic activities;Neuromuscular re-education;Patient/family education;Manual techniques;Passive range of motion;Energy conservation    PT Next Visit Plan abdominal massage, daily tasks without holding breath, nustep, sidely pelvic floor contraction    Recommended Other Services MD signed initial eval    Consulted and Agree with Plan of Care Patient           Patient will benefit from skilled therapeutic intervention in order to improve the following deficits and impairments:  Increased fascial restricitons, Pain, Decreased coordination, Increased muscle spasms, Decreased activity tolerance, Decreased endurance, Decreased strength, Impaired flexibility  Visit Diagnosis: Muscle weakness (generalized)  Unspecified lack of coordination  Rectal prolapse     Problem List Patient Active Problem List   Diagnosis Date Noted  . Chronic diastolic (congestive) heart failure ([place]) 01/13/2019  . PAT (paroxysmal atrial tachycardia) ([place]) 01/13/2019  . CKD stage 3 due to type 2 diabetes mellitus ([place]) 01/13/2019  . Rectal prolapse 06/24/2018  . Temporal arteritis ([place]) 04/06/2018  . Chronic cough 10/30/2017  . MDD (major depressive disorder), recurrent, in full remission ([place]) 11/30/2016  . Protein-calorie malnutrition, severe 11/30/2016  . Elevated alkaline phosphatase level 09/18/2016  . Hypertensive retinopathy of both eyes, grade 2 04/20/2015  . Lumbar spondylosis 06/16/2014  . Carpal tunnel syndrome 02/18/2014  . Osteopenia 01/11/2013  . Routine adult health maintenance 01/11/2013  . Diverticulosis 09/18/2012  .  Allergic rhinitis 02/13/2012  . Coronary artery disease 08/29/2011  . HEART MURMUR, SYSTOLIC 50/35/4656  . Enlargement of clavicle 03/30/2008  . Solitary pulmonary nodule  12/15/2006  . Hyperlipidemia 11/28/2006  . Hypothyroidism 09/04/2006  . Type 2 diabetes mellitus, controlled ([place]) 09/04/2006  . Migraine 09/04/2006  . Essential hypertension 09/04/2006  . GERD 09/04/2006  . PMR (polymyalgia rheumatica) ([facility]) 09/04/2006    Earlie Counts, PT 06/12/20 11:07 AM   [facility] [address], [place], [place], [address] Phone: [phone]   Fax:  [phone]  Name: PERI KREFT MRN: [id number] Date of Birth: 01/24/1934

## 2020-06-19 ENCOUNTER — Other Ambulatory Visit: Payer: Self-pay

## 2020-06-19 ENCOUNTER — Ambulatory Visit: Payer: Medicare Other | Attending: Gastroenterology | Admitting: Physical Therapy

## 2020-06-19 ENCOUNTER — Encounter: Payer: Self-pay | Admitting: Physical Therapy

## 2020-06-19 DIAGNOSIS — R279 Unspecified lack of coordination: Secondary | ICD-10-CM | POA: Insufficient documentation

## 2020-06-19 DIAGNOSIS — K623 Rectal prolapse: Secondary | ICD-10-CM | POA: Insufficient documentation

## 2020-06-19 DIAGNOSIS — M6281 Muscle weakness (generalized): Secondary | ICD-10-CM | POA: Diagnosis not present

## 2020-06-19 NOTE — Patient Instructions (Addendum)
About Abdominal Massage  Abdominal massage, also called external colon massage, is a self-treatment circular massage technique that can reduce and eliminate gas and ease constipation. The colon naturally contracts in waves in a clockwise direction starting from inside the right hip, moving up toward the ribs, across the belly, and down inside the left hip.  When you perform circular abdominal massage, you help stimulate your colons normal wave pattern of movement called peristalsis.  It is most beneficial when done after eating.  Positioning You can practice abdominal massage with oil while lying down, or in the shower with soap.  Some people find that it is just as effective to do the massage through clothing while sitting or standing.  How to Massage Start by placing your finger tips or knuckles on your right side, just inside your hip bone.   Make small circular movements while you move upward toward your rib cage.    Once you reach the bottom right side of your rib cage, take your circular movements across to the left side of the bottom of your rib cage.   Next, move downward until you reach the inside of your left hip bone.  This is the path your feces travel in your colon.  Continue to perform your abdominal massage in this pattern for 10 minutes each day.     You can apply as much pressure as is comfortable in your massage.  Start gently and build pressure as you continue to practice.  Notice any areas of pain as you massage; areas of slight pain may be relieved as you massage, but if you have areas of significant or intense pain, consult with your healthcare provider.  Other Considerations  General physical activity including bending and stretching can have a beneficial massage-like effect on the colon.  Deep breathing can also stimulate the colon because breathing deeply activates the same nervous system that supplies the colon.    Abdominal massage should always be used in  combination with a bowel-conscious diet that is high in the proper type of fiber for you, fluids (primarily water), and a regular exercise program.  Adduction: Hip - Knees Together With Pelvic Floor (Hook-Lying)    Lie with hips and knees bent, towel roll between knees. Squeeze pelvic floor while pushing knees together. Hold for __5_ seconds. Rest for _5__ seconds. Repeat __10_ times. Do 2___ times a day.   Copyright  VHI. All rights reserved.  [facility] [address] [place], [place] [address] Phone # [phone] Fax [phone]

## 2020-06-19 NOTE — Therapy (Signed)
[facility] [address] [place], [place], [address] Phone: [phone]   Fax:  [phone]  Physical Therapy Treatment  Patient Details  Name: Christine [name] MRN: [id number] Date of Birth: 01/24/1934 Referring Provider (PT): Dr. Dedham Cellar   Encounter Date: 06/19/2020   PT End of Session - 06/19/20 0926    Visit Number 3    Date for PT Re-Evaluation 08/28/20    Authorization Type Medicare/Medicaid    Authorization - Visit Number 3    Authorization - Number of Visits 10    PT Start Time 6283   came late   PT Stop Time 0925    PT Time Calculation (min) 30 min    Activity Tolerance Patient tolerated treatment well    Behavior During Therapy [facility] for tasks assessed/performed           Past Medical History:  Diagnosis Date  . Anemia   . Anginal pain ([place])   . Arthritis    "back, arms, hips; hands" (11/30/2015)  . Benign hypertensive heart and kidney disease with diastolic CHF, NYHA class II and CKD stage III ([place])   . Blood transfusion 1972   "after daughter born, attempted to give me blood; couldn't give it cause my blood was cold" (09/23/2013)  . CAD (coronary artery disease)    a. LHC 08/23/11: dLM 40-50%, oRI 40%, oCFX 40%, oD1 70% (small and not amenable to PCI).  LM lesion did not appear to be flow limiting.  Medical rx was recommended;  b. Echo 08/23/11: mild LVH, EF 60-65%, grade 1 diast dysfxn, mild BAE, PASP 24;  c. 02/2012  Cath: LM 50d, LAD 50p, D1 70ost, RI 70p, RCA ok->Med Rx;  d. 01/2013 Cardiolite: EF 88, no ischemia/infarct.  . Carotid stenosis    a. dopplers 10/12:  0-39% bilat ICA;  b. 10/2012 U/S: 0-39% bilat, f/u 1 yr (10/2013).  . Chronic bronchitis   . Depression   . Diverticulosis of colon (without mention of hemorrhage)   . GERD (gastroesophageal reflux disease)   . Heart murmur, systolic    2-D echo in December 2011 showed a normal EF with grade 1 diastolic dysfunction, trivial pulmonary  regurgitation and mildly elevated PA pressure at 37 mmHg probably secondary to her COPD.dynamic obstruction-mid cavity obliteration;  b. 02/2012 Echo: EF 60-65%, mild LVH, PASP 55mmHg.  Marland Kitchen History of stomach ulcers 1970's  . Hyperlipidemia   . Hypertension   . Hypothyroidism   . Migraines    a. Next atypical symptoms in the past. Patient was started on Neurontin for possible neuropathic origin of her pain.  . Polymyalgia rheumatica ([place])   . Shortness of breath on exertion    "just related to angina >1 yr ago" (09/23/2013)  . Sinus arrhythmia   . Type II diabetes mellitus ([place])    a. On oral hypoglycemic agents.    Past Surgical History:  Procedure Laterality Date  . CARDIAC CATHETERIZATION N/A 09/26/2015   Procedure: Right/Left Heart Cath and Coronary Angiography;  Surgeon: Larey Dresser, MD;  Location: [place] [facility];  Service: Cardiovascular;  Laterality: N/A;  . CATARACT EXTRACTION W/ INTRAOCULAR LENS  IMPLANT, BILATERAL Bilateral 1990's  . LEFT HEART CATHETERIZATION WITH CORONARY ANGIOGRAM N/A 03/16/2012   Procedure: LEFT HEART CATHETERIZATION WITH CORONARY ANGIOGRAM;  Surgeon: Hillary Bow, MD;  Location: [facility] CATH LAB;  Service: Cardiovascular;  Laterality: N/A;  . RIGHT/LEFT HEART CATH AND CORONARY ANGIOGRAPHY N/A 08/12/2017   Procedure: RIGHT/LEFT HEART  CATH AND CORONARY ANGIOGRAPHY;  Surgeon: Martinique, Peter M, MD;  Location: [place] [facility];  Service: Cardiovascular;  Laterality: N/A;  . VAGINAL HYSTERECTOMY  1976    There were no vitals filed for this visit.   Subjective Assessment - 06/19/20 0855    Subjective I have been feeling good. I have done the exercise. The prolapse still comes out. The prolapse is not coming out as much. No change in rectal pain.    Patient Stated Goals reduce her prolapse    Currently in Pain? No/denies              [facility] PT Assessment - 06/19/20 0001      Strength   Right Hip Extension 4/5    Right Hip External Rotation  5/5     Right Hip ABduction 3/5    Left Hip Extension 3/5    Left Hip External Rotation 5/5    Left Hip ABduction 4/5    Right Knee Flexion 4/5    Right Knee Extension 4/5    Left Knee Flexion 4/5    Left Knee Extension 4/5                         [facility] Adult PT Treatment/Exercise - 06/19/20 0001      Self-Care   Self-Care Other Self-Care Comments    Other Self-Care Comments  reviewed ways to manage prolapse      Lumbar Exercises: Supine   Ab Set 10 reps;5 seconds    AB Set Limitations with pelvic floor contraction and ball squeeze    Bridge with Ball Squeeze 10 reps;1 second      Lumbar Exercises: Sidelying   Other Sidelying Lumbar Exercises right sidely with squeeze the pillow and contract the pelvic floor for 5 sec and rest for 10 sec, 10x    Other Sidelying Lumbar Exercises sidely with pelvic floor contraction with arm movement      Knee/Hip Exercises: Standing   Other Standing Knee Exercises going up and down staris with pelvic floro contraction and not holding breath      Manual Therapy   Manual Therapy Soft tissue mobilization    Soft tissue mobilization circular massage to promote peristalic motion of the intestines                   PT Education - 06/19/20 0926    Education Details abdominal massage; pelvic floor contraction with ball squeeze    Methods Explanation;Demonstration;Handout    Comprehension Verbalized understanding;Returned demonstration            PT Short Term Goals - 06/19/20 0930      PT SHORT TERM GOAL #1   Title independent with initial HEP with pelvic floor contraction    Time 4    Period Weeks    Status Achieved      PT SHORT TERM GOAL #2   Title abitliy to contract the pelvic floor and lower abdominal to improve continence    Time 4    Period Weeks    Status On-going      PT SHORT TERM GOAL #3   Title understand correct toilet technique to fully evacuate his bowels    Time 4    Period Weeks    Status On-going        PT SHORT TERM GOAL #4   Title understand bowel health    Time 4    Period Weeks  Status Achieved             PT Long Term Goals - 06/05/20 0911      PT LONG TERM GOAL #1   Title independent with HEP and understand how to progress herself    Baseline --    Time 12    Period Weeks    Status New    Target Date 08/28/20      PT LONG TERM GOAL #2   Title improved fecal leakage >/= 50% due to improved strength    Baseline --    Time 8    Period Weeks    Status New    Target Date 08/28/20      PT LONG TERM GOAL #3   Title rectal prolpase reduce 50% of the time due to imrpoved pelvic floor strength >/= 3/5 holding or 10 seconds    Baseline --    Time 12    Period Weeks    Status New    Target Date 08/28/20      PT LONG TERM GOAL #4   Title understand ways to manage rectal prolpase     Time 12    Period Weeks    Status New    Target Date 08/28/20                 Plan - 06/19/20 6270    Clinical Impression Statement Patient reports her prolapse is not coming out as much. Her rectal pain is the same. When patient goes up and down stairs, her prolapse comes out at times. Patient needs tactile cues to contract the pelvic floor correctly and tighten her abdominals. Patient has no change in hip strength at this time. Patient will benefit from skilled therapy to improve pelvic floor strength, education on prolapse management, and toileting technque.    Personal Factors and Comorbidities Comorbidity 3+;Age;Fitness;Time since onset of injury/illness/exacerbation    Comorbidities diabetes, polymyalgia rheumatica; vaginal hysterectomy; heart murmur; Hypothyroidism    Examination-Activity Limitations Continence;Sit;Locomotion Level;Transfers    Examination-Participation Restrictions Community Activity    Stability/Clinical Decision Making Evolving/Moderate complexity    Rehab Potential Good    Clinical Impairments Affecting Rehab Potential Polymyalgia rhumatica;  osteopenia; diverticulosis; hypothyroidism, diabetes; vaginal hysterectomy; cardiac catherization    PT Frequency 1x / week    PT Duration 12 weeks    PT Treatment/Interventions Biofeedback;Therapeutic exercise;Therapeutic activities;Neuromuscular re-education;Patient/family education;Manual techniques;Passive range of motion;Energy conservation    PT Next Visit Plan daily tasks without holding breath, nustep, pelvic floor contraction in supine with leg movement           Patient will benefit from skilled therapeutic intervention in order to improve the following deficits and impairments:  Increased fascial restricitons, Pain, Decreased coordination, Increased muscle spasms, Decreased activity tolerance, Decreased endurance, Decreased strength, Impaired flexibility  Visit Diagnosis: Muscle weakness (generalized)  Unspecified lack of coordination  Rectal prolapse     Problem List Patient Active Problem List   Diagnosis Date Noted  . Chronic diastolic (congestive) heart failure ([place]) 01/13/2019  . PAT (paroxysmal atrial tachycardia) ([place]) 01/13/2019  . CKD stage 3 due to type 2 diabetes mellitus ([place]) 01/13/2019  . Rectal prolapse 06/24/2018  . Temporal arteritis ([place]) 04/06/2018  . Chronic cough 10/30/2017  . MDD (major depressive disorder), recurrent, in full remission ([place]) 11/30/2016  . Protein-calorie malnutrition, severe 11/30/2016  . Elevated alkaline phosphatase level 09/18/2016  . Hypertensive retinopathy of both eyes, grade 2 04/20/2015  . Lumbar spondylosis 06/16/2014  . Carpal tunnel  syndrome 02/18/2014  . Osteopenia 01/11/2013  . Routine adult health maintenance 01/11/2013  . Diverticulosis 09/18/2012  . Allergic rhinitis 02/13/2012  . Coronary artery disease 08/29/2011  . HEART MURMUR, SYSTOLIC 55/83/1674  . Enlargement of clavicle 03/30/2008  . Solitary pulmonary nodule 12/15/2006  . Hyperlipidemia 11/28/2006  . Hypothyroidism 09/04/2006  . Type 2 diabetes  mellitus, controlled ([place]) 09/04/2006  . Migraine 09/04/2006  . Essential hypertension 09/04/2006  . GERD 09/04/2006  . PMR (polymyalgia rheumatica) ([facility]) 09/04/2006    Earlie Counts, PT 06/19/20 9:31 AM   [facility] [address], [place], [place], [address] Phone: [phone]   Fax:  [phone]  Name: Christine [name] MRN: [id number] Date of Birth: 01/24/1934

## 2020-06-22 ENCOUNTER — Other Ambulatory Visit: Payer: Self-pay | Admitting: Internal Medicine

## 2020-06-23 ENCOUNTER — Other Ambulatory Visit: Payer: Self-pay

## 2020-06-23 ENCOUNTER — Ambulatory Visit (INDEPENDENT_AMBULATORY_CARE_PROVIDER_SITE_OTHER): Payer: Medicare Other | Admitting: Internal Medicine

## 2020-06-23 ENCOUNTER — Encounter: Payer: Self-pay | Admitting: Internal Medicine

## 2020-06-23 VITALS — BP 118/47 | HR 78 | Temp 97.9°F | Ht 64.0 in | Wt 127.1 lb

## 2020-06-23 DIAGNOSIS — N183 Chronic kidney disease, stage 3 unspecified: Secondary | ICD-10-CM

## 2020-06-23 DIAGNOSIS — I130 Hypertensive heart and chronic kidney disease with heart failure and stage 1 through stage 4 chronic kidney disease, or unspecified chronic kidney disease: Secondary | ICD-10-CM

## 2020-06-23 DIAGNOSIS — I10 Essential (primary) hypertension: Secondary | ICD-10-CM

## 2020-06-23 DIAGNOSIS — G43809 Other migraine, not intractable, without status migrainosus: Secondary | ICD-10-CM | POA: Diagnosis not present

## 2020-06-23 DIAGNOSIS — I5032 Chronic diastolic (congestive) heart failure: Secondary | ICD-10-CM

## 2020-06-23 DIAGNOSIS — E039 Hypothyroidism, unspecified: Secondary | ICD-10-CM

## 2020-06-23 DIAGNOSIS — I2510 Atherosclerotic heart disease of native coronary artery without angina pectoris: Secondary | ICD-10-CM | POA: Insufficient documentation

## 2020-06-23 DIAGNOSIS — I25119 Atherosclerotic heart disease of native coronary artery with unspecified angina pectoris: Secondary | ICD-10-CM

## 2020-06-23 DIAGNOSIS — K623 Rectal prolapse: Secondary | ICD-10-CM | POA: Diagnosis not present

## 2020-06-23 DIAGNOSIS — K219 Gastro-esophageal reflux disease without esophagitis: Secondary | ICD-10-CM | POA: Diagnosis not present

## 2020-06-23 DIAGNOSIS — J309 Allergic rhinitis, unspecified: Secondary | ICD-10-CM | POA: Diagnosis not present

## 2020-06-23 DIAGNOSIS — J302 Other seasonal allergic rhinitis: Secondary | ICD-10-CM

## 2020-06-23 DIAGNOSIS — E1122 Type 2 diabetes mellitus with diabetic chronic kidney disease: Secondary | ICD-10-CM | POA: Diagnosis not present

## 2020-06-23 DIAGNOSIS — I471 Supraventricular tachycardia: Secondary | ICD-10-CM | POA: Diagnosis not present

## 2020-06-23 DIAGNOSIS — E785 Hyperlipidemia, unspecified: Secondary | ICD-10-CM | POA: Diagnosis not present

## 2020-06-23 DIAGNOSIS — M5412 Radiculopathy, cervical region: Secondary | ICD-10-CM | POA: Diagnosis not present

## 2020-06-23 DIAGNOSIS — F3342 Major depressive disorder, recurrent, in full remission: Secondary | ICD-10-CM

## 2020-06-23 DIAGNOSIS — E119 Type 2 diabetes mellitus without complications: Secondary | ICD-10-CM

## 2020-06-23 DIAGNOSIS — I4719 Other supraventricular tachycardia: Secondary | ICD-10-CM

## 2020-06-23 LAB — POCT GLYCOSYLATED HEMOGLOBIN (HGB A1C): Hemoglobin A1C: 6.8 % — AB (ref 4.0–5.6)

## 2020-06-23 LAB — GLUCOSE, CAPILLARY: Glucose-Capillary: 159 mg/dL — ABNORMAL HIGH (ref 70–99)

## 2020-06-23 NOTE — Assessment & Plan Note (Signed)
She denies any symptoms at this time.  She is on imipramine only.    She did not fill out the PHQ-9 today.  We will have her fill this out at next visit.   Plan Continue to monitor.

## 2020-06-23 NOTE — Assessment & Plan Note (Signed)
She has no complaints today.  She is taking famotidine without issue.   Plan Continue famotidine

## 2020-06-23 NOTE — Assessment & Plan Note (Signed)
She seemed to have a breakthrough migraine recently, this has not recurred and was very short lived.  She also has a history of GCA.  Pain was not classic for this and was not temporal in location.  I asked her to monitor for any worsening.  Increased headaches, movement of headache, persistent headaches.  She will call if this recurs or any red flag symptoms and we will plan to do further work up.  (ESR and brain imaging)  She is on imipramine for control.  She has been on this medication for > 50 years.  She had a very severe withdrawal reaction when this was stopped without taper previously.  I would maintain her on this medication unless the risk outweighs the benefit at this time.  She is not having any symptoms on increased sedation.

## 2020-06-23 NOTE — Assessment & Plan Note (Signed)
Pulse is 78 today.  She denies any recent palpitations and notes that she feels well.  She is on amlodipine which will control HR a bit.  Continue to monitor.  Last EKG was 2 years ago.  Consider updating this at next visit.

## 2020-06-23 NOTE — Assessment & Plan Note (Signed)
Well controlled with flonase.   Continue flonase

## 2020-06-23 NOTE — Assessment & Plan Note (Signed)
This is the main issue bothering her today.  She was not able to continue pelvic floor therapy through the pandemic. She has recently started this service back in the last 3 weeks and is seeing some improvement.  She has discussed surgery with her GI physician and he does not think she would be a good candidate.  This would only be considered for severe worsening or lifestyle changes.    Continue pelvic floor therapy.

## 2020-06-23 NOTE — Assessment & Plan Note (Signed)
LDL at last check was 76.  She denies chest pain today.   Plan Continue atorvastatin

## 2020-06-23 NOTE — Assessment & Plan Note (Signed)
She current has no chest pain.  She is not complaining of palpitations or any other symptoms at this time.  She has occasional stable angina.  She is on plavix.   Plan Continue plavix, statin Continue good BP control Continue good DM control

## 2020-06-23 NOTE — Progress Notes (Signed)
° °  Subjective:    [name], female    DOB: 01/24/1934, 86 y.o.   MRN: [id number]  CC: 3 month follow up for DM2, HTN  HPI  Christine [name] is an 86 year old woman with PMH of allergies, cervical radiculopathy, HFpEF, CAD, HTN, GERD, migraine, MDD, hypothyroidism, H/O GCA, DM2 and HLD who presents for routine follow up.   Christine [name] reports that she is doing very well.  She has no new complaints today.  She received a steroid injection at the C7-T1 level on 06/06/20 and she notes that this has helped her shoulder and hand pain significantly.  She reports about 95% improvement.  I advised her to keep following up with her provider for further injections as needed.   She is checking her blood sugar daily and her blood sugars are ranging in the 100s to 130s.  She is off of steroids.   She had one episode of a headache earlier this month that started in the left forehead and radiated to the right, sharp, 10/10, but only lasted for a few minutes.  She felt that a migraine was trying to break through.  She has not had any further symptoms since that time.  No shoulder pain, thigh pain or other issues.  She has been on imipramine for a long time for migraines  She also notes persistent rectal prolapse.  She is back in pelvic floor rehab for this and feels it is getting slowly better.  She is not a good surgical candidate at this time.   Review of Systems  Constitutional: Negative for activity change, appetite change and fever.  Respiratory: Negative for cough, choking and shortness of breath.   Cardiovascular: Negative for chest pain and palpitations.  Musculoskeletal: Negative for arthralgias and back pain.  Neurological: Negative for dizziness and weakness.  Psychiatric/Behavioral: Negative for decreased concentration and dysphoric mood.       Objective:   Physical Exam Vitals and nursing note reviewed.  Constitutional:      Appearance: Normal appearance. She is normal weight.    HENT:     Head: Normocephalic and atraumatic.  Cardiovascular:     Rate and Rhythm: Normal rate and regular rhythm.     Pulses: Normal pulses.     Heart sounds: No murmur heard.   Pulmonary:     Effort: Pulmonary effort is normal. No respiratory distress.     Breath sounds: Normal breath sounds. No wheezing.  Abdominal:     General: Abdomen is flat.     Palpations: Abdomen is soft.  Musculoskeletal:        General: Deformity (She has bilateral bunions, some thickened toenails on feet) present. No swelling, tenderness or signs of injury.     Right lower leg: No edema.     Left lower leg: No edema.  Skin:    General: Skin is warm and dry.     Findings: No bruising, erythema, lesion or rash.  Neurological:     Mental Status: She is alert and oriented to person, place, and time. Mental status is at baseline.  Psychiatric:        Mood and Affect: Mood normal.        Behavior: Behavior normal.     TSH and BMET today.       Assessment & Plan:  Return in 3 months for routine follow up.

## 2020-06-23 NOTE — Assessment & Plan Note (Signed)
This was seen on TTE.  She has no swelling or SOB today.  She has no complaints.   Continue to monitor.

## 2020-06-23 NOTE — Assessment & Plan Note (Signed)
This is a chronic and stable issue.  She has no change in urination, dysuria or abdominal pain.  Her DM and BP are well controlled.  She has no complaints at this time. MAU/Cr at last check was < 30.   Plan Continue good control of DM and BP Check BMET today.

## 2020-06-23 NOTE — Assessment & Plan Note (Signed)
BP today was 118/47.  She has not had any dizziness or weakness.  She has not tolerated beta blockers in the past due to low diastolic pressure.    Plan Continue amlodipine and valsartan BMET today.

## 2020-06-23 NOTE — Assessment & Plan Note (Signed)
She has very well controlled DM2.  Her last A1C was 7.2 and her current A1C is 6.8.  We have discussed coming off her medications in the past, but she would prefer to remain on glimepiride as she has been on it for so long.  She brought in her blood sugar monitor which showed AM blood sugars ranging from 110-130s.  She has an LDL of 76 at last check.  MAU/Cr is < 30.  She is on an ARB.  She is due for an eye exam, we will remind her at next visit to schedule.  We did a foot exam today.  She has bunions and some thickened toenails.  She has no wounds or lesions.  She has good pulses and good sensation.  She is able to care for her feet.  Will consider podiatry if she becomes unable to care for feet and needs assistance with bunions or toenails.    Plan Continue glimepiride Return to clinic in 3 months for follow up.  Continue statin.

## 2020-06-23 NOTE — Patient Instructions (Signed)
Ms. Baglio  - -  I am glad to see you doing so well!    Please continue all of your medications as prescribed.   Please come back to see me in 3-4 months.   Thank you!

## 2020-06-23 NOTE — Assessment & Plan Note (Signed)
She had a radiology directed steroid injection at C7-T1 on 06/06/20 and her wrist, hand and arm pain has resolved.  Continue to monitor.  No adverse effects from injection noted today.

## 2020-06-23 NOTE — Assessment & Plan Note (Signed)
Last TSH was 2.8.  Will recheck today.  She has no signs/symptoms of hyper or hypothyroidism at this time.   Continue synthroid 25 mcg daily.

## 2020-06-24 LAB — BMP8+ANION GAP
Anion Gap: 15 mmol/L (ref 10.0–18.0)
BUN/Creatinine Ratio: 19 (ref 12–28)
BUN: 19 mg/dL (ref 8–27)
CO2: 20 mmol/L (ref 20–29)
Calcium: 9 mg/dL (ref 8.7–10.3)
Chloride: 104 mmol/L (ref 96–106)
Creatinine, Ser: 0.98 mg/dL (ref 0.57–1.00)
GFR calc Af Amer: 61 mL/min/{1.73_m2} (ref >59)
GFR calc non Af Amer: 53 mL/min/{1.73_m2} — ABNORMAL LOW (ref >59)
Glucose: 101 mg/dL — ABNORMAL HIGH (ref 65–99)
Potassium: 4.4 mmol/L (ref 3.5–5.2)
Sodium: 139 mmol/L (ref 134–144)

## 2020-06-24 LAB — TSH: TSH: 2.38 u[IU]/mL (ref 0.450–4.500)

## 2020-06-27 ENCOUNTER — Telehealth: Payer: Self-pay

## 2020-06-27 ENCOUNTER — Encounter: Payer: Self-pay | Admitting: Internal Medicine

## 2020-06-27 ENCOUNTER — Telehealth: Payer: Self-pay | Admitting: Internal Medicine

## 2020-06-27 NOTE — Telephone Encounter (Signed)
Requesting to speak with a nurse about meds, please call pt back.  

## 2020-06-27 NOTE — Telephone Encounter (Signed)
Attempted to call patient.  No answer.  Non identified VM so did not leave a message.    All blood work is stable.  No changes to medications.  No concerning findings.  If patient calls back, can someone please let her know this?   Will also send a letter.   Gilles Chiquito, MD

## 2020-06-27 NOTE — Telephone Encounter (Signed)
rtc to ph# left by family, left genneral message to rtc

## 2020-07-10 ENCOUNTER — Encounter: Payer: Medicare Other | Admitting: Physical Therapy

## 2020-07-17 ENCOUNTER — Ambulatory Visit: Payer: Medicare Other | Admitting: Physical Therapy

## 2020-08-04 ENCOUNTER — Telehealth: Payer: Self-pay | Admitting: Dietician

## 2020-08-04 NOTE — Telephone Encounter (Signed)
[facility] will need a CMN form signed every 6 months. They are sending one now

## 2020-08-07 ENCOUNTER — Other Ambulatory Visit: Payer: Self-pay

## 2020-08-07 ENCOUNTER — Encounter: Payer: Self-pay | Admitting: Physical Therapy

## 2020-08-07 ENCOUNTER — Ambulatory Visit: Payer: Medicare Other | Attending: Gastroenterology | Admitting: Physical Therapy

## 2020-08-07 DIAGNOSIS — M6281 Muscle weakness (generalized): Secondary | ICD-10-CM | POA: Diagnosis not present

## 2020-08-07 DIAGNOSIS — K623 Rectal prolapse: Secondary | ICD-10-CM

## 2020-08-07 DIAGNOSIS — R279 Unspecified lack of coordination: Secondary | ICD-10-CM | POA: Diagnosis not present

## 2020-08-07 NOTE — Patient Instructions (Signed)
Access Code: [email or web address] URL: [email or web address] Date: 08/07/2020 Prepared by: Earlie Counts  Exercises Supine Bridge with [employer] Between Knees - 1 x daily - 4 x weekly - 1 sets - 15 reps Hooklying Isometric Hip Flexion - 1 x daily - 4 x weekly - 1 sets - 5 reps - 5 sec hold Supine Hip Adduction Isometric with Ball - 1 x daily - 4 x weekly - 2 sets - 5 reps - 5 sec hold Clamshell - 1 x daily - 4 x weekly - 1 sets - 10 reps [facility] [address] [place], [place] [address] Phone # [phone] Fax [phone]

## 2020-08-07 NOTE — Therapy (Signed)
[facility] [address] [place], [place], [address] Phone: [phone]   Fax:  [phone]  Physical Therapy Treatment  Patient Details  Name: Christine [name] MRN: [id number] Date of Birth: 01/24/1934 Referring Provider (PT): Dr. [place] Cellar   Encounter Date: 08/07/2020   PT End of Session - 08/07/20 0849    Visit Number 4    Date for PT Re-Evaluation 08/28/20    Authorization Type Medicare/Medicaid    Authorization - Visit Number 4    Authorization - Number of Visits 10    PT Start Time 0845    PT Stop Time 0923    PT Time Calculation (min) 38 min    Activity Tolerance Patient tolerated treatment well    Behavior During Therapy [facility] for tasks assessed/performed           Past Medical History:  Diagnosis Date  . Anemia   . Anginal pain ([place])   . Arthritis    "back, arms, hips; hands" (11/30/2015)  . Benign hypertensive heart and kidney disease with diastolic CHF, NYHA class II and CKD stage III ([place])   . Blood transfusion 1972   "after daughter born, attempted to give me blood; couldn't give it cause my blood was cold" (09/23/2013)  . CAD (coronary artery disease)    a. LHC 08/23/11: dLM 40-50%, oRI 40%, oCFX 40%, oD1 70% (small and not amenable to PCI).  LM lesion did not appear to be flow limiting.  Medical rx was recommended;  b. Echo 08/23/11: mild LVH, EF 60-65%, grade 1 diast dysfxn, mild BAE, PASP 24;  c. 02/2012  Cath: LM 50d, LAD 50p, D1 70ost, RI 70p, RCA ok->Med Rx;  d. 01/2013 Cardiolite: EF 88, no ischemia/infarct.  . Carotid stenosis    a. dopplers 10/12:  0-39% bilat ICA;  b. 10/2012 U/S: 0-39% bilat, f/u 1 yr (10/2013).  . Chronic bronchitis   . Depression   . Diverticulosis of colon (without mention of hemorrhage)   . GERD (gastroesophageal reflux disease)   . Heart murmur, systolic    2-D echo in December 2011 showed a normal EF with grade 1 diastolic dysfunction, trivial pulmonary  regurgitation and mildly elevated PA pressure at 37 mmHg probably secondary to her COPD.dynamic obstruction-mid cavity obliteration;  b. 02/2012 Echo: EF 60-65%, mild LVH, PASP 71mmHg.  Marland Kitchen History of stomach ulcers 1970's  . Hyperlipidemia   . Hypertension   . Hypothyroidism   . Migraines    a. Next atypical symptoms in the past. Patient was started on Neurontin for possible neuropathic origin of her pain.  . Polymyalgia rheumatica ([place])   . Shortness of breath on exertion    "just related to angina >1 yr ago" (09/23/2013)  . Sinus arrhythmia   . Type II diabetes mellitus ([place])    a. On oral hypoglycemic agents.    Past Surgical History:  Procedure Laterality Date  . CARDIAC CATHETERIZATION N/A 09/26/2015   Procedure: Right/Left Heart Cath and Coronary Angiography;  Surgeon: Larey Dresser, MD;  Location: [facility];  Service: Cardiovascular;  Laterality: N/A;  . CATARACT EXTRACTION W/ INTRAOCULAR LENS  IMPLANT, BILATERAL Bilateral 1990's  . LEFT HEART CATHETERIZATION WITH CORONARY ANGIOGRAM N/A 03/16/2012   Procedure: LEFT HEART CATHETERIZATION WITH CORONARY ANGIOGRAM;  Surgeon: Hillary Bow, MD;  Location: [facility];  Service: Cardiovascular;  Laterality: N/A;  . RIGHT/LEFT HEART CATH AND CORONARY ANGIOGRAPHY N/A 08/12/2017   Procedure: RIGHT/LEFT HEART CATH AND CORONARY  ANGIOGRAPHY;  Surgeon: Martinique, Peter M, MD;  Location: [facility];  Service: Cardiovascular;  Laterality: N/A;  . VAGINAL HYSTERECTOMY  1976    There were no vitals filed for this visit.   Subjective Assessment - 08/07/20 0851    Subjective I still have a prolapse that is more or less depending of the day. Sometimes I have bleeding.    Patient Stated Goals reduce her prolapse    Currently in Pain? Yes    Pain Score 7     Pain Location Rectum    Pain Orientation Mid    Pain Descriptors / Indicators Discomfort    Pain Type Chronic pain    Pain Onset More than a month ago    Pain Frequency  Intermittent    Aggravating Factors  after she has a big bowel movement, after a shower    Pain Relieving Factors get the prolapse in and lay down, pain medication    Multiple Pain Sites No                             [facility] Adult PT Treatment/Exercise - 08/07/20 0001      Self-Care   Self-Care Other Self-Care Comments    Other Self-Care Comments  educated patient on rectocele, showed pictures and educated on using a coconut oil to keep the area moisturized      Lumbar Exercises: Supine   Bridge with [employer] 15 reps;1 second   VC to squeeze the anus   Isometric Hip Flexion 10 reps;5 seconds    Isometric Hip Flexion Limitations each leg with counting out loud to reduce chance of prolapse, squeeze the anus    Other Supine Lumbar Exercises supine ball squeeze holding for 5 sec, squeeze the anus and count out loud for 5 sec      Lumbar Exercises: Sidelying   Clam Right;Left;10 reps;1 second    Clam Limitations anus contraction                  PT Education - 08/07/20 0939    Education Details Access Code: [email or web address]; instruction on using coconut oil on vaginal and anal area    Person(s) Educated Patient    Methods Explanation;Demonstration;Verbal cues;Handout    Comprehension Returned demonstration;Verbalized understanding            PT Short Term Goals - 08/07/20 0944      PT SHORT TERM GOAL #1   Title independent with initial HEP with pelvic floor contraction    Time 4    Period Weeks    Status Achieved      PT SHORT TERM GOAL #2   Title abitliy to contract the pelvic floor and lower abdominal to improve continence    Time 4    Period Weeks    Status Achieved      PT SHORT TERM GOAL #3   Title understand correct toilet technique to fully evacuate his bowels    Time 4    Period Weeks    Status On-going      PT SHORT TERM GOAL #4   Title understand bowel health    Time 4    Period Weeks    Status Achieved             PT Long  Term Goals - 06/05/20 0911      PT LONG TERM GOAL #1   Title independent with HEP and understand how  to progress herself    Baseline --    Time 12    Period Weeks    Status New    Target Date 08/28/20      PT LONG TERM GOAL #2   Title improved fecal leakage >/= 50% due to improved strength    Baseline --    Time 8    Period Weeks    Status New    Target Date 08/28/20      PT LONG TERM GOAL #3   Title rectal prolpase reduce 50% of the time due to imrpoved pelvic floor strength >/= 3/5 holding or 10 seconds    Baseline --    Time 12    Period Weeks    Status New    Target Date 08/28/20      PT LONG TERM GOAL #4   Title understand ways to manage rectal prolpase     Time 12    Period Weeks    Status New    Target Date 08/28/20                 Plan - 08/07/20 0858    Clinical Impression Statement Patient is able to contract the pelvic floor and abdominals at the same time. Patient is able to feel the urge for a bowel movement better. Patient continues to have rectal prolapse which is better some days. She is doing her HEP and today it was updated. Patient was instructed today to use coconut oil on the rectal area to reduce irritation and some blood. Patient still needs to be educated on the toileting technique. Patient will benefit from skilled therapy to improve pelvic floor strength, educateion on prolapse management and toieting technique.    Personal Factors and Comorbidities Comorbidity 3+;Age;Fitness;Time since onset of injury/illness/exacerbation    Comorbidities diabetes, polymyalgia rheumatica; vaginal hysterectomy; heart murmur; Hypothyroidism    Examination-Activity Limitations Continence;Sit;Locomotion Level;Transfers    Examination-Participation Restrictions Community Activity    Stability/Clinical Decision Making Evolving/Moderate complexity    Rehab Potential Good    Clinical Impairments Affecting Rehab Potential Polymyalgia rhumatica; osteopenia;  diverticulosis; hypothyroidism, diabetes; vaginal hysterectomy; cardiac catherization    PT Frequency 1x / week    PT Duration 12 weeks    PT Treatment/Interventions Biofeedback;Therapeutic exercise;Therapeutic activities;Neuromuscular re-education;Patient/family education;Manual techniques;Passive range of motion;Energy conservation    PT Next Visit Plan go over toileting, use pelvic floor EMG for  contraction in supine and sidely, daily activitie with decreased holding breath    PT Home Exercise Plan Access Code: [email or web address]    Consulted and Agree with Plan of Care Patient           Patient will benefit from skilled therapeutic intervention in order to improve the following deficits and impairments:  Increased fascial restricitons, Pain, Decreased coordination, Increased muscle spasms, Decreased activity tolerance, Decreased endurance, Decreased strength, Impaired flexibility  Visit Diagnosis: Muscle weakness (generalized)  Unspecified lack of coordination  Rectal prolapse     Problem List Patient Active Problem List   Diagnosis Date Noted  . Coronary artery disease involving native coronary artery of native heart with angina pectoris ([place]) 06/23/2020  . Cervical radiculopathy at C7 06/23/2020  . Chronic diastolic (congestive) heart failure ([place]) 01/13/2019  . PAT (paroxysmal atrial tachycardia) ([place]) 01/13/2019  . CKD stage 3 due to type 2 diabetes mellitus ([place]) 01/13/2019  . Rectal prolapse 06/24/2018  . Chronic cough 10/30/2017  . MDD (major depressive disorder), recurrent, in full remission ([place]) 11/30/2016  . Elevated alkaline phosphatase  level 09/18/2016  . Hypertensive retinopathy of both eyes, grade 2 04/20/2015  . Lumbar spondylosis 06/16/2014  . Carpal tunnel syndrome 02/18/2014  . Osteopenia 01/11/2013  . Routine adult health maintenance 01/11/2013  . Diverticulosis 09/18/2012  . Allergic rhinitis 02/13/2012  . HEART MURMUR, SYSTOLIC 73/71/0626  . Solitary  pulmonary nodule 12/15/2006  . Hyperlipidemia 11/28/2006  . Hypothyroidism 09/04/2006  . Type 2 diabetes mellitus, controlled ([place]) 09/04/2006  . Migraine 09/04/2006  . Essential hypertension 09/04/2006  . GERD 09/04/2006    Earlie Counts, PT 08/07/20 9:46 AM   [facility] [address], [place], [place], [address] Phone: [phone]   Fax:  [phone]  Name: Christine [name] MRN: [id number] Date of Birth: 01/24/1934

## 2020-08-14 ENCOUNTER — Ambulatory Visit: Payer: Medicare Other | Admitting: Physical Therapy

## 2020-08-17 ENCOUNTER — Encounter: Payer: Self-pay | Admitting: Gastroenterology

## 2020-08-17 ENCOUNTER — Telehealth: Payer: Self-pay

## 2020-08-17 NOTE — Telephone Encounter (Signed)
error 

## 2020-08-17 NOTE — Telephone Encounter (Signed)
Returned call to patient. Requesting manual w/c. Sched with PCP for telehealth visit on 08/23/2020 at [facility] to discuss need. Hubbard Hartshorn, BSN, RN-BC

## 2020-08-17 NOTE — Telephone Encounter (Signed)
Requesting to speak with a nurse about getting wheelchair, please call pt back.

## 2020-08-21 ENCOUNTER — Ambulatory Visit: Payer: Medicare Other | Attending: Gastroenterology | Admitting: Physical Therapy

## 2020-08-21 ENCOUNTER — Encounter: Payer: Self-pay | Admitting: Physical Therapy

## 2020-08-21 ENCOUNTER — Other Ambulatory Visit: Payer: Self-pay

## 2020-08-21 DIAGNOSIS — M6281 Muscle weakness (generalized): Secondary | ICD-10-CM | POA: Insufficient documentation

## 2020-08-21 DIAGNOSIS — K623 Rectal prolapse: Secondary | ICD-10-CM | POA: Diagnosis not present

## 2020-08-21 DIAGNOSIS — R279 Unspecified lack of coordination: Secondary | ICD-10-CM | POA: Insufficient documentation

## 2020-08-21 NOTE — Telephone Encounter (Signed)
Thank you :)

## 2020-08-21 NOTE — Addendum Note (Signed)
Addended by: Earlie Counts F on: 08/21/2020 10:28 AM   Modules accepted: Orders

## 2020-08-21 NOTE — Therapy (Signed)
[facility] [address], [place], [place], [address] Phone: [phone]   Fax:  [phone]  Physical Therapy Treatment  Patient Details  Name: Christine [name] MRN: [id number] Date of Birth: 01/24/1934 Referring Provider (PT): Dr. Holt Cellar   Encounter Date: 08/21/2020   PT End of Session - 08/21/20 1011    Visit Number 5    Date for PT Re-Evaluation 10/23/20    Authorization Type Medicare/Medicaid    Authorization - Visit Number 5    Authorization - Number of Visits 10    PT Start Time 0930    PT Stop Time 1010    PT Time Calculation (min) 40 min    Activity Tolerance Patient tolerated treatment well    Behavior During Therapy [facility] for tasks assessed/performed           Past Medical History:  Diagnosis Date  . Anemia   . Anginal pain ([place])   . Arthritis    "back, arms, hips; hands" (11/30/2015)  . Benign hypertensive heart and kidney disease with diastolic CHF, NYHA class II and CKD stage III ([place])   . Blood transfusion 1972   "after daughter born, attempted to give me blood; couldn't give it cause my blood was cold" (09/23/2013)  . CAD (coronary artery disease)    a. LHC 08/23/11: dLM 40-50%, oRI 40%, oCFX 40%, oD1 70% (small and not amenable to PCI).  LM lesion did not appear to be flow limiting.  Medical rx was recommended;  b. Echo 08/23/11: mild LVH, EF 60-65%, grade 1 diast dysfxn, mild BAE, PASP 24;  c. 02/2012  Cath: LM 50d, LAD 50p, D1 70ost, RI 70p, RCA ok->Med Rx;  d. 01/2013 Cardiolite: EF 88, no ischemia/infarct.  . Carotid stenosis    a. dopplers 10/12:  0-39% bilat ICA;  b. 10/2012 U/S: 0-39% bilat, f/u 1 yr (10/2013).  . Chronic bronchitis   . Depression   . Diverticulosis of colon (without mention of hemorrhage)   . GERD (gastroesophageal reflux disease)   . Heart murmur, systolic    2-D echo in December 2011 showed a normal EF with grade 1 diastolic dysfunction, trivial pulmonary  regurgitation and mildly elevated PA pressure at 37 mmHg probably secondary to her COPD.dynamic obstruction-mid cavity obliteration;  b. 02/2012 Echo: EF 60-65%, mild LVH, PASP 73mmHg.  Marland Kitchen History of stomach ulcers 1970's  . Hyperlipidemia   . Hypertension   . Hypothyroidism   . Migraines    a. Next atypical symptoms in the past. Patient was started on Neurontin for possible neuropathic origin of her pain.  . Polymyalgia rheumatica ([place])   . Shortness of breath on exertion    "just related to angina >1 yr ago" (09/23/2013)  . Sinus arrhythmia   . Type II diabetes mellitus ([place])    a. On oral hypoglycemic agents.    Past Surgical History:  Procedure Laterality Date  . CARDIAC CATHETERIZATION N/A 09/26/2015   Procedure: Right/Left Heart Cath and Coronary Angiography;  Surgeon: Larey Dresser, MD;  Location: [facility];  Service: Cardiovascular;  Laterality: N/A;  . CATARACT EXTRACTION W/ INTRAOCULAR LENS  IMPLANT, BILATERAL Bilateral 1990's  . LEFT HEART CATHETERIZATION WITH CORONARY ANGIOGRAM N/A 03/16/2012   Procedure: LEFT HEART CATHETERIZATION WITH CORONARY ANGIOGRAM;  Surgeon: Hillary Bow, MD;  Location: [facility] CATH LAB;  Service: Cardiovascular;  Laterality: N/A;  . RIGHT/LEFT HEART CATH AND CORONARY ANGIOGRAPHY N/A 08/12/2017   Procedure: RIGHT/LEFT HEART CATH AND CORONARY  ANGIOGRAPHY;  Surgeon: Martinique, Peter M, MD;  Location: [facility];  Service: Cardiovascular;  Laterality: N/A;  . VAGINAL HYSTERECTOMY  1976    There were no vitals filed for this visit.   Subjective Assessment - 08/21/20 0933    Subjective Stool is type 3. She does not strain. I was not able to come last week due to pain in my right knee. No changes with the prolapse. I have not been able to do the exercises as much due to being in pain. I do the exercises in sidely better. I have not had alot of pain but I have had some bleeding.    Patient Stated Goals reduce her prolapse    Currently in Pain?  Yes    Pain Score 7     Pain Location Rectum    Pain Orientation Mid    Pain Descriptors / Indicators Discomfort    Pain Type Chronic pain    Pain Onset More than a month ago    Pain Frequency Intermittent    Aggravating Factors  after she has a big bowel movement, after a shower    Pain Relieving Factors get the prolapse in and lay down, pain medication              [facility] PT Assessment - 08/21/20 0001      Assessment   Medical Diagnosis K62.3 REctal prolapse; K21.9 Gatroesphageal reflux disease, unspedfied whether  esphagitis present    Referring Provider (PT) Dr. [place] Cellar    Onset Date/Surgical Date 05/18/18    Hand Dominance Right    Prior Therapy yes      Precautions   Precautions None      Restrictions   Weight Bearing Restrictions No      Prior Function   Level of Independence Independent    Vocation Retired      Associate Professor   Overall Cognitive Status Within Functional Limits for tasks assessed      PROM   Right Hip External Rotation  50    Left Hip External Rotation  50      Strength   Right Hip Extension 4+/5    Right Hip External Rotation  5/5    Right Hip ABduction 3+/5    Left Hip Extension 4+/5    Left Hip External Rotation 5/5    Left Hip ABduction 4/5    Right Knee Flexion 4+/5    Right Knee Extension 4+/5    Left Knee Flexion 4+/5    Left Knee Extension 4+/5                      Pelvic Floor Special Questions - 08/21/20 0001    Urinary frequency 1 pad during day with paper towel and toilet paper;  and same at night    Fecal incontinence Yes   strain,             [facility] Adult PT Treatment/Exercise - 08/21/20 0001      Self-Care   Self-Care Other Self-Care Comments    Other Self-Care Comments  discussed with patient her diet, shape of stool, drinking water       Therapeutic Activites    Therapeutic Activities Other Therapeutic Activities    Other Therapeutic Activities instruction on correct toileting, using show  boxes under feet, using a deep grr sound to push the stool out, discussed with patient on contracting the anus after a bowel movement      Lumbar  Exercises: Sidelying   Other Sidelying Lumbar Exercises right sidely with squeeze the pillow and contract the pelvic floor for 5 sec and rest for 10 sec, 10x                  PT Education - 08/21/20 1011    Education Details Access Code: [email or web address]    Person(s) Educated Patient    Methods Explanation;Demonstration;Handout    Comprehension Returned demonstration;Verbalized understanding            PT Short Term Goals - 08/21/20 1015      PT SHORT TERM GOAL #1   Title independent with initial HEP with pelvic floor contraction    Time 4    Period Weeks    Status Achieved    Target Date 07/03/20      PT SHORT TERM GOAL #2   Title abitliy to contract the pelvic floor and lower abdominal to improve continence    Time 4    Status Achieved      PT SHORT TERM GOAL #3   Title understand correct toilet technique to fully evacuate his bowels    Time 4    Period Weeks    Status On-going      PT SHORT TERM GOAL #4   Title understand bowel health    Time 4    Period Weeks    Status Achieved    Target Date 07/03/20             PT Long Term Goals - 08/21/20 1001      PT LONG TERM GOAL #1   Title independent with HEP and understand how to progress herself    Time 12    Period Weeks    Status On-going      PT LONG TERM GOAL #2   Title improved fecal leakage >/= 50% due to improved strength    Baseline reports it is 60% better    Time 8    Period Weeks    Status On-going      PT LONG TERM GOAL #3   Title rectal prolpase reduce 50% of the time due to imrpoved pelvic floor strength >/= 3/5 holding or 10 seconds    Time 12    Period Weeks    Status On-going      PT LONG TERM GOAL #4   Title understand ways to manage rectal prolpase     Time 12    Period Weeks    Status On-going                 Plan -  08/21/20 1012    Clinical Impression Statement Patient reports her fecal leakage is 60% better. She is now wearing 1 pad per day and night with extra toilet paper. Patient is doing the sidely exercises more due to decreased strain on her joints. Patient reports increased anal sensation when she is having leakage. Rectal pain still gets to a 7/10. Left hip ER increased from 40 degrees to 50 degrees. Bilateral hip strength has increased. Patient has difficulty with isolating her pelvic floor contraction and needs verbal cues ot not contract her gluteals. Patient still needs review o toileting. Patient will benefit from skilled therapy to improve pelvic floor strength, education on prolapse management and toileting technique.    Personal Factors and Comorbidities Comorbidity 3+;Age;Fitness;Time since onset of injury/illness/exacerbation    Comorbidities diabetes, polymyalgia rheumatica; vaginal hysterectomy; heart murmur; Hypothyroidism    Examination-Activity Limitations Continence;Sit;Locomotion Level;Transfers  Examination-Participation Restrictions Community Activity    Stability/Clinical Decision Making Evolving/Moderate complexity    Rehab Potential Good    Clinical Impairments Affecting Rehab Potential Polymyalgia rhumatica; osteopenia; diverticulosis; hypothyroidism, diabetes; vaginal hysterectomy; cardiac catherization    PT Frequency 1x / week    PT Duration 12 weeks    PT Treatment/Interventions Biofeedback;Therapeutic exercise;Therapeutic activities;Neuromuscular re-education;Patient/family education;Manual techniques;Passive range of motion;Energy conservation    PT Next Visit Plan go over toileting, use pelvic floor EMG for  contraction in sidely and sidely, daily activitie with decreased holding breath    PT Home Exercise Plan Access Code: [email or web address]    Consulted and Agree with Plan of Care Patient           Patient will benefit from skilled therapeutic intervention in order to  improve the following deficits and impairments:  Increased fascial restricitons, Pain, Decreased coordination, Increased muscle spasms, Decreased activity tolerance, Decreased endurance, Decreased strength, Impaired flexibility  Visit Diagnosis: Muscle weakness (generalized)  Unspecified lack of coordination  Rectal prolapse     Problem List Patient Active Problem List   Diagnosis Date Noted  . Coronary artery disease involving native coronary artery of native heart with angina pectoris ([place]) 06/23/2020  . Cervical radiculopathy at C7 06/23/2020  . Chronic diastolic (congestive) heart failure ([place]) 01/13/2019  . PAT (paroxysmal atrial tachycardia) ([place]) 01/13/2019  . CKD stage 3 due to type 2 diabetes mellitus ([place]) 01/13/2019  . Rectal prolapse 06/24/2018  . Chronic cough 10/30/2017  . MDD (major depressive disorder), recurrent, in full remission ([place]) 11/30/2016  . Elevated alkaline phosphatase level 09/18/2016  . Hypertensive retinopathy of both eyes, grade 2 04/20/2015  . Lumbar spondylosis 06/16/2014  . Carpal tunnel syndrome 02/18/2014  . Osteopenia 01/11/2013  . Routine adult health maintenance 01/11/2013  . Diverticulosis 09/18/2012  . Allergic rhinitis 02/13/2012  . HEART MURMUR, SYSTOLIC 05/69/7948  . Solitary pulmonary nodule 12/15/2006  . Hyperlipidemia 11/28/2006  . Hypothyroidism 09/04/2006  . Type 2 diabetes mellitus, controlled ([place]) 09/04/2006  . Migraine 09/04/2006  . Essential hypertension 09/04/2006  . GERD 09/04/2006    Earlie Counts, PT 08/21/20 10:17 AM   [facility] [address], [place], [place], [address] Phone: [phone]   Fax:  [phone]  Name: Christine [name] MRN: [id number] Date of Birth: 01/24/1934

## 2020-08-21 NOTE — Patient Instructions (Signed)
Access Code: [email or web address] URL: [email or web address] Date: 08/21/2020 Prepared by: Earlie Counts  Exercises Sidelying Pelvic Floor Contraction with Self-Palpation - 3 x daily - 7 x weekly - 1 sets - 10 reps - 5 sec hold [facility] [address] [place], [place] [address] Phone # [phone] Fax [phone]

## 2020-08-22 DIAGNOSIS — Z79899 Other long term (current) drug therapy: Secondary | ICD-10-CM | POA: Diagnosis not present

## 2020-08-22 DIAGNOSIS — M25541 Pain in joints of right hand: Secondary | ICD-10-CM | POA: Diagnosis not present

## 2020-08-22 DIAGNOSIS — M5412 Radiculopathy, cervical region: Secondary | ICD-10-CM | POA: Diagnosis not present

## 2020-08-22 DIAGNOSIS — Z23 Encounter for immunization: Secondary | ICD-10-CM | POA: Diagnosis not present

## 2020-08-22 DIAGNOSIS — M25542 Pain in joints of left hand: Secondary | ICD-10-CM | POA: Diagnosis not present

## 2020-08-22 DIAGNOSIS — M79645 Pain in left finger(s): Secondary | ICD-10-CM | POA: Diagnosis not present

## 2020-08-23 ENCOUNTER — Ambulatory Visit (INDEPENDENT_AMBULATORY_CARE_PROVIDER_SITE_OTHER): Payer: Medicare Other | Admitting: Internal Medicine

## 2020-08-23 ENCOUNTER — Other Ambulatory Visit: Payer: Self-pay

## 2020-08-23 DIAGNOSIS — K623 Rectal prolapse: Secondary | ICD-10-CM

## 2020-08-23 DIAGNOSIS — I10 Essential (primary) hypertension: Secondary | ICD-10-CM | POA: Diagnosis not present

## 2020-08-23 DIAGNOSIS — F3342 Major depressive disorder, recurrent, in full remission: Secondary | ICD-10-CM | POA: Diagnosis not present

## 2020-08-23 DIAGNOSIS — M47816 Spondylosis without myelopathy or radiculopathy, lumbar region: Secondary | ICD-10-CM

## 2020-08-23 DIAGNOSIS — I25119 Atherosclerotic heart disease of native coronary artery with unspecified angina pectoris: Secondary | ICD-10-CM | POA: Diagnosis not present

## 2020-08-23 DIAGNOSIS — E119 Type 2 diabetes mellitus without complications: Secondary | ICD-10-CM

## 2020-08-23 MED ORDER — IMIPRAMINE HCL 50 MG PO TABS: 50.0000 mg | ORAL_TABLET | Freq: Two times a day (BID) | ORAL | 3 refills | Status: DC

## 2020-08-23 NOTE — Assessment & Plan Note (Signed)
BP is well controlled at home.  She will continue her current therapy.

## 2020-08-23 NOTE — Telephone Encounter (Signed)
CM sent to Skeet Latch at [facility] for manual w/c. F2F was done today. Hubbard Hartshorn, BSN, RN-BC

## 2020-08-23 NOTE — Progress Notes (Signed)
[facility] Telephone Encounter Continuity Care Appointment  HPI:   This telephone encounter was created for [name] on 08/23/2020 for the following purpose/cc Follow up and discussion of need for wheelchair.  Christine [name] is an 86 year old woman with PMH of rectal prolapse, PMR, joint pain who presented on the phone to discuss need for manual wheelchair.  She notes that when she is out and about, she has trouble walking due to joint pain and also because her prolapse will sometimes come out, causing her significant discomfort.  She further has chronic thigh pain related to her rheumatological disease.  She is following with a rheumatologist.    She has HTN and DM as well and her last A1C was very well controlled and her BP has been doing well at home.  She notes at her last clinic visit her BP was 110/70 and her BP at home has been ranging from 494-496 systolic.  She has no dizziness, falls, headaches, chest pain  This patient suffers from polymyalgia rheumatica, chronic arthritis, rectal prolapse which impairs their ability to perform activities such as grocery shopping, seeing a physician and other options of getting out of the house.  A walker will not resolve this issue at this time.  A wheelchair will allow the patient to safely perform the above activities.  Patient can safely propel a wheelchair and also has family as caregiver who can help her.    Past Medical History:  Past Medical History:  Diagnosis Date  . Anemia   . Anginal pain ([place])   . Arthritis    "back, arms, hips; hands" (11/30/2015)  . Benign hypertensive heart and kidney disease with diastolic CHF, NYHA class II and CKD stage III ([place])   . Blood transfusion 1972   "after daughter born, attempted to give me blood; couldn't give it cause my blood was cold" (09/23/2013)  . CAD (coronary artery disease)    a. LHC 08/23/11: dLM 40-50%, oRI 40%, oCFX 40%, oD1 70% (small and not amenable to  PCI).  LM lesion did not appear to be flow limiting.  Medical rx was recommended;  b. Echo 08/23/11: mild LVH, EF 60-65%, grade 1 diast dysfxn, mild BAE, PASP 24;  c. 02/2012  Cath: LM 50d, LAD 50p, D1 70ost, RI 70p, RCA ok->Med Rx;  d. 01/2013 Cardiolite: EF 88, no ischemia/infarct.  . Carotid stenosis    a. dopplers 10/12:  0-39% bilat ICA;  b. 10/2012 U/S: 0-39% bilat, f/u 1 yr (10/2013).  . Chronic bronchitis   . Depression   . Diverticulosis of colon (without mention of hemorrhage)   . GERD (gastroesophageal reflux disease)   . Heart murmur, systolic    2-D echo in December 2011 showed a normal EF with grade 1 diastolic dysfunction, trivial pulmonary regurgitation and mildly elevated PA pressure at 37 mmHg probably secondary to her COPD.dynamic obstruction-mid cavity obliteration;  b. 02/2012 Echo: EF 60-65%, mild LVH, PASP 15mmHg.  Marland Kitchen History of stomach ulcers 1970's  . Hyperlipidemia   . Hypertension   . Hypothyroidism   . Migraines    a. Next atypical symptoms in the past. Patient was started on Neurontin for possible neuropathic origin of her pain.  . Polymyalgia rheumatica ([place])   . Shortness of breath on exertion    "just related to angina >1 yr ago" (09/23/2013)  . Sinus arrhythmia   . Type II diabetes mellitus ([place])    a. On oral hypoglycemic agents.  ROS:   Continued intermittent rectal prolapse that improves with exercises, thigh pain.  Otherwise negative.    Assessment / Plan / Recommendations:   Please see A&P under problem oriented charting for assessment of the patient's acute and chronic medical conditions.   As always, pt is advised that if symptoms worsen or new symptoms arise, they should go to an urgent care facility or to to ER for further evaluation.   Consent and Medical Decision Making:   This is a telephone encounter between Christine [name] and Gilles Chiquito on 08/23/2020 for assessment of wheelchair need. The visit was conducted with the patient located at  home and Gilles Chiquito at [facility]. The patient's identity was confirmed using their DOB and current address. The patient has consented to being evaluated through a telephone encounter and understands the associated risks (an examination cannot be done and the patient may need to come in for an appointment) / benefits (allows the patient to remain at home, decreasing exposure to coronavirus). I personally spent 20 minutes on medical discussion.    Return to clinic in 2-3 months.

## 2020-08-23 NOTE — Telephone Encounter (Signed)
[name], Leah  Linden Mikes L, RN; [name], Leah; Stenson, Melissa; Gilliam, Pamela; New, Bradley; 1 other ° ° °received, thanks  ° °

## 2020-08-23 NOTE — Assessment & Plan Note (Signed)
CBGs are doing well at home.  Christine [name] is very good at keeping track of her blood sugars!Marland Kitchen  She will continue on glimepiride.  She has no lows to report, no falls or dizziness.

## 2020-08-23 NOTE — Assessment & Plan Note (Signed)
She will need a wheelchair as noted above for outside of the home activities.  Plan will be to assess strength and need for further physical therapy at next visit.

## 2020-09-08 ENCOUNTER — Telehealth: Payer: Self-pay

## 2020-09-08 NOTE — Telephone Encounter (Signed)
Returned call to patient. She has not yet received her manual w/c. CM sent to Skeet Latch at [facility] to look into this. Hubbard Hartshorn, BSN, RN-BC

## 2020-09-08 NOTE — Telephone Encounter (Signed)
Theophilus Bones, RN; Sandi Raveling, Charlotte Sanes; 1 other   Cloretta Ned!   Our logistics team called the patient on 10/18 @ 10:01am and left a message for the patient letting her know we are currently out of stock. The logistics team is due to call the patient again today to give her another update.   Thanks!

## 2020-09-08 NOTE — Telephone Encounter (Signed)
Patient called in saying she has not received her manual w/c. CM sent to Skeet Latch at [facility] to look into this for [place]. Hubbard Hartshorn, BSN, RN-BC

## 2020-09-08 NOTE — Telephone Encounter (Signed)
Pt is requesting a nurse or Dr Daryll Drown [phone]

## 2020-09-15 ENCOUNTER — Other Ambulatory Visit: Payer: Self-pay | Admitting: Internal Medicine

## 2020-09-15 NOTE — Telephone Encounter (Signed)
Pt was calling about a refill then asked about  Her manual w/c which she has not received.Lauren, can you f/u? Thanks

## 2020-09-18 ENCOUNTER — Other Ambulatory Visit: Payer: Self-pay

## 2020-09-18 ENCOUNTER — Encounter: Payer: Self-pay | Admitting: Physical Therapy

## 2020-09-18 ENCOUNTER — Ambulatory Visit: Payer: Medicare Other | Attending: Gastroenterology | Admitting: Physical Therapy

## 2020-09-18 DIAGNOSIS — M6281 Muscle weakness (generalized): Secondary | ICD-10-CM | POA: Insufficient documentation

## 2020-09-18 DIAGNOSIS — K623 Rectal prolapse: Secondary | ICD-10-CM

## 2020-09-18 DIAGNOSIS — R279 Unspecified lack of coordination: Secondary | ICD-10-CM | POA: Insufficient documentation

## 2020-09-18 NOTE — Therapy (Signed)
[facility] [address] [place], [place], [address] Phone: [phone]   Fax:  [phone]  Physical Therapy Treatment  Patient Details  Name: Christine [name] MRN: [id number] Date of Birth: 01/24/1934 Referring Provider (PT): Dr. Elmira Cellar   Encounter Date: 09/18/2020   PT End of Session - 09/18/20 1236    Visit Number 6    Date for PT Re-Evaluation 10/23/20    Authorization Type Medicare/Medicaid    Authorization - Visit Number 6    Authorization - Number of Visits 10    PT Start Time 1230    PT Stop Time 1308    PT Time Calculation (min) 38 min    Activity Tolerance Patient tolerated treatment well    Behavior During Therapy [facility] for tasks assessed/performed           Past Medical History:  Diagnosis Date  . Anemia   . Anginal pain ([place])   . Arthritis    "back, arms, hips; hands" (11/30/2015)  . Benign hypertensive heart and kidney disease with diastolic CHF, NYHA class II and CKD stage III ([place])   . Blood transfusion 1972   "after daughter born, attempted to give me blood; couldn't give it cause my blood was cold" (09/23/2013)  . CAD (coronary artery disease)    a. LHC 08/23/11: dLM 40-50%, oRI 40%, oCFX 40%, oD1 70% (small and not amenable to PCI).  LM lesion did not appear to be flow limiting.  Medical rx was recommended;  b. Echo 08/23/11: mild LVH, EF 60-65%, grade 1 diast dysfxn, mild BAE, PASP 24;  c. 02/2012  Cath: LM 50d, LAD 50p, D1 70ost, RI 70p, RCA ok->Med Rx;  d. 01/2013 Cardiolite: EF 88, no ischemia/infarct.  . Carotid stenosis    a. dopplers 10/12:  0-39% bilat ICA;  b. 10/2012 U/S: 0-39% bilat, f/u 1 yr (10/2013).  . Chronic bronchitis   . Depression   . Diverticulosis of colon (without mention of hemorrhage)   . GERD (gastroesophageal reflux disease)   . Heart murmur, systolic    2-D echo in December 2011 showed a normal EF with grade 1 diastolic dysfunction, trivial pulmonary  regurgitation and mildly elevated PA pressure at 37 mmHg probably secondary to her COPD.dynamic obstruction-mid cavity obliteration;  b. 02/2012 Echo: EF 60-65%, mild LVH, PASP 22mmHg.  Marland Kitchen History of stomach ulcers 1970's  . Hyperlipidemia   . Hypertension   . Hypothyroidism   . Migraines    a. Next atypical symptoms in the past. Patient was started on Neurontin for possible neuropathic origin of her pain.  . Polymyalgia rheumatica ([place])   . Shortness of breath on exertion    "just related to angina >1 yr ago" (09/23/2013)  . Sinus arrhythmia   . Type II diabetes mellitus ([place])    a. On oral hypoglycemic agents.    Past Surgical History:  Procedure Laterality Date  . CARDIAC CATHETERIZATION N/A 09/26/2015   Procedure: Right/Left Heart Cath and Coronary Angiography;  Surgeon: Larey Dresser, MD;  Location: [place] [facility];  Service: Cardiovascular;  Laterality: N/A;  . CATARACT EXTRACTION W/ INTRAOCULAR LENS  IMPLANT, BILATERAL Bilateral 1990's  . LEFT HEART CATHETERIZATION WITH CORONARY ANGIOGRAM N/A 03/16/2012   Procedure: LEFT HEART CATHETERIZATION WITH CORONARY ANGIOGRAM;  Surgeon: Hillary Bow, MD;  Location: [facility];  Service: Cardiovascular;  Laterality: N/A;  . RIGHT/LEFT HEART CATH AND CORONARY ANGIOGRAPHY N/A 08/12/2017   Procedure: RIGHT/LEFT HEART CATH AND CORONARY  ANGIOGRAPHY;  Surgeon: Martinique, Peter M, MD;  Location: [place] [facility];  Service: Cardiovascular;  Laterality: N/A;  . VAGINAL HYSTERECTOMY  1976    There were no vitals filed for this visit.   Subjective Assessment - 09/18/20 1236    Subjective I had back pain for 4 days making it hard to do the exercise. Paitent had one normal bowel movement this week. Sometimes  the prolapse is doing better and sometimes it is not.Rectal pain is not that bad.    Patient Stated Goals reduce her prolapse    Currently in Pain? No/denies                          Pelvic Floor Special Questions -  09/18/20 0001    Biofeedback sidely quick onctraction to 16 uv, contract 5 sec 10 uv, 20 sec in sidely 8 sec, sitting 5 sec 7 uv, sitting quick contraction to 10 uv; standing quick contraction 25 uv, hold 3 sec 18 uv,     Biofeedback sensor type Surface   rectal    Biofeedback Activity Obturator/adductor assist                       PT Short Term Goals - 08/21/20 1015      PT SHORT TERM GOAL #1   Title independent with initial HEP with pelvic floor contraction    Time 4    Period Weeks    Status Achieved    Target Date 07/03/20      PT SHORT TERM GOAL #2   Title abitliy to contract the pelvic floor and lower abdominal to improve continence    Time 4    Status Achieved      PT SHORT TERM GOAL #3   Title understand correct toilet technique to fully evacuate his bowels    Time 4    Period Weeks    Status On-going      PT SHORT TERM GOAL #4   Title understand bowel health    Time 4    Period Weeks    Status Achieved    Target Date 07/03/20             PT Long Term Goals - 09/18/20 1317      PT LONG TERM GOAL #1   Title independent with HEP and understand how to progress herself    Time 12    Period Weeks    Status On-going      PT LONG TERM GOAL #2   Title improved fecal leakage >/= 50% due to improved strength    Time 8    Status Achieved      PT LONG TERM GOAL #3   Title rectal prolpase reduce 50% of the time due to imrpoved pelvic floor strength >/= 3/5 holding or 10 seconds    Time 12    Period Weeks      PT LONG TERM GOAL #4   Title understand ways to manage rectal prolpase     Status Achieved                 Plan - 09/18/20 1306    Clinical Impression Statement Patient reports her fecal leakage is 70% improved. Patient will have a bowel movement every 1 - 5 days. She is taking her fiber and eating lots fo fruit and vegetables. Patient anus is still gapping. She is able to contract the anus for  20 seconds in sidely at 18 uv, sitting  for 5 seconds at 18 uv and standing at 7 uv. Patient does not hold her breath with pelvic floor contraction. Patient reports improvement of her prolapse. Patient is still working on her anal sensation and will improve as the muscle strength increases. Patient will benefit from skilled therapy to improve pelvic floor strength and toileting technique.    Personal Factors and Comorbidities Comorbidity 3+;Age;Fitness;Time since onset of injury/illness/exacerbation    Comorbidities diabetes, polymyalgia rheumatica; vaginal hysterectomy; heart murmur; Hypothyroidism    Examination-Activity Limitations Continence;Sit;Locomotion Level;Transfers    Examination-Participation Restrictions Community Activity    Stability/Clinical Decision Making Evolving/Moderate complexity    Rehab Potential Good    Clinical Impairments Affecting Rehab Potential Polymyalgia rhumatica; osteopenia; diverticulosis; hypothyroidism, diabetes; vaginal hysterectomy; cardiac catherization    PT Frequency 1x / week    PT Duration 12 weeks    PT Treatment/Interventions Biofeedback;Therapeutic exercise;Therapeutic activities;Neuromuscular re-education;Patient/family education;Manual techniques;Passive range of motion;Energy conservation    PT Next Visit Plan continue using the pelvic floor EMG with some core activation and standing with movement    PT Home Exercise Plan Access Code: [email or web address]    Consulted and Agree with Plan of Care Patient           Patient will benefit from skilled therapeutic intervention in order to improve the following deficits and impairments:  Increased fascial restricitons, Pain, Decreased coordination, Increased muscle spasms, Decreased activity tolerance, Decreased endurance, Decreased strength, Impaired flexibility  Visit Diagnosis: Muscle weakness (generalized)  Unspecified lack of coordination  Rectal prolapse     Problem List Patient Active Problem List   Diagnosis Date Noted  . Coronary  artery disease involving native coronary artery of native heart with angina pectoris ([place]) 06/23/2020  . Cervical radiculopathy at C7 06/23/2020  . Chronic diastolic (congestive) heart failure ([place]) 01/13/2019  . PAT (paroxysmal atrial tachycardia) ([place]) 01/13/2019  . CKD stage 3 due to type 2 diabetes mellitus ([place]) 01/13/2019  . Rectal prolapse 06/24/2018  . Chronic cough 10/30/2017  . MDD (major depressive disorder), recurrent, in full remission ([place]) 11/30/2016  . Elevated alkaline phosphatase level 09/18/2016  . Hypertensive retinopathy of both eyes, grade 2 04/20/2015  . Lumbar spondylosis 06/16/2014  . Carpal tunnel syndrome 02/18/2014  . Osteopenia 01/11/2013  . Routine adult health maintenance 01/11/2013  . Diverticulosis 09/18/2012  . Allergic rhinitis 02/13/2012  . HEART MURMUR, SYSTOLIC 16/08/9603  . Solitary pulmonary nodule 12/15/2006  . Hyperlipidemia 11/28/2006  . Hypothyroidism 09/04/2006  . Type 2 diabetes mellitus, controlled ([place]) 09/04/2006  . Migraine 09/04/2006  . Essential hypertension 09/04/2006  . GERD 09/04/2006    Earlie Counts, PT 09/18/20 1:18 PM   [facility] [address] [place], [place], [address] Phone: [phone]   Fax:  [phone]  Name: Christine [name] MRN: [id number] Date of Birth: 01/24/1934

## 2020-09-21 ENCOUNTER — Ambulatory Visit: Payer: Medicare Other | Attending: Internal Medicine

## 2020-09-21 DIAGNOSIS — Z23 Encounter for immunization: Secondary | ICD-10-CM

## 2020-09-21 NOTE — Progress Notes (Signed)
   Covid-19 Vaccination Clinic  Name:  Christine [name]    MRN: [id number] DOB: 01/24/1934  09/21/2020  Christine [name] was observed post Covid-19 immunization for 15 minutes without incident. She was provided with Vaccine Information Sheet and instruction to access the V-Safe system.   Christine [name] was instructed to call 911 with any severe reactions post vaccine: Marland Kitchen Difficulty breathing  . Swelling of face and throat  . A fast heartbeat  . A bad rash all over body  . Dizziness and weakness

## 2020-09-22 ENCOUNTER — Ambulatory Visit (INDEPENDENT_AMBULATORY_CARE_PROVIDER_SITE_OTHER): Payer: Medicare Other | Admitting: Internal Medicine

## 2020-09-22 ENCOUNTER — Encounter: Payer: Self-pay | Admitting: Internal Medicine

## 2020-09-22 VITALS — BP 143/53 | HR 96 | Temp 98.3°F | Ht 64.0 in | Wt 126.7 lb

## 2020-09-22 DIAGNOSIS — I10 Essential (primary) hypertension: Secondary | ICD-10-CM

## 2020-09-22 DIAGNOSIS — I25119 Atherosclerotic heart disease of native coronary artery with unspecified angina pectoris: Secondary | ICD-10-CM | POA: Diagnosis not present

## 2020-09-22 DIAGNOSIS — K623 Rectal prolapse: Secondary | ICD-10-CM | POA: Diagnosis not present

## 2020-09-22 DIAGNOSIS — G5603 Carpal tunnel syndrome, bilateral upper limbs: Secondary | ICD-10-CM

## 2020-09-22 DIAGNOSIS — E119 Type 2 diabetes mellitus without complications: Secondary | ICD-10-CM

## 2020-09-22 DIAGNOSIS — R2681 Unsteadiness on feet: Secondary | ICD-10-CM | POA: Diagnosis not present

## 2020-09-22 DIAGNOSIS — M47816 Spondylosis without myelopathy or radiculopathy, lumbar region: Secondary | ICD-10-CM

## 2020-09-22 DIAGNOSIS — H35033 Hypertensive retinopathy, bilateral: Secondary | ICD-10-CM | POA: Diagnosis not present

## 2020-09-22 LAB — POCT GLYCOSYLATED HEMOGLOBIN (HGB A1C): Hemoglobin A1C: 6.7 % — AB (ref 4.0–5.6)

## 2020-09-22 LAB — GLUCOSE, CAPILLARY: Glucose-Capillary: 166 mg/dL — ABNORMAL HIGH (ref 70–99)

## 2020-09-22 MED ORDER — ONETOUCH ULTRA VI STRP: ORAL_STRIP | 3 refills | Status: DC

## 2020-09-22 NOTE — Progress Notes (Signed)
   Subjective:    [name], female    DOB: 01/24/1934, 86 y.o.   MRN: [id number]  CC: 3 month follow up for DM2  HPI   Christine [name] is an 86 year old woman with PMH of DM2, HTN and gait abnormality who presents for follow up.  Christine [name] is requesting help with a rollator walker to use while she is out and about.  I will order this today.  She has polymyalgia rheumatica, thigh pain and rectal prolapse which make it difficult for her to walk long distances.  The rollator will help her stay active.   The main thing we discussed today was her rectal prolapse.  She has prolapse with almost every bowel movement.  She is back in physical therapy and feels that this has helped a little bit.  She is concerned that she will not have complete recovery from this modality.  She notes that she is keeping her stools loose, but she continues to have bleeding with wiping and occasionally what seems like "a lot of blood."  She notes occasional dizziness, but none current.  She reported to me that Dr. Havery Moros did not think she would do well with the surgery.  However, she feels that she would like to get an opinion from a surgeon and if she could tolerate the surgery, she would prefer this route.    She brought in her BP and BS logs which I reviewed.  She is well controlled.  Her BP is sometimes higher for a systolic reading, but she has a low diastolic for the most part so further titration of her medications could cause worsening symptoms.  She has an eye appointment on 12/10.  She is due for refill of her testing strips.     Review of Systems  Constitutional: Positive for activity change. Negative for fatigue and fever.  Respiratory: Negative for cough and shortness of breath.   Cardiovascular: Negative for chest pain and leg swelling.  Gastrointestinal: Positive for anal bleeding and blood in stool. Negative for abdominal distention, abdominal pain, constipation and rectal pain.    Musculoskeletal: Positive for arthralgias and gait problem.  Skin: Negative for color change and pallor.  Neurological: Positive for dizziness and weakness (gen with excessive walking).  Psychiatric/Behavioral: Negative for decreased concentration and dysphoric mood.       Objective:   Physical Exam Vitals and nursing note reviewed.  Constitutional:      General: She is not in acute distress.    Appearance: Normal appearance. She is not toxic-appearing.  HENT:     Head: Normocephalic and atraumatic.  Pulmonary:     Effort: Pulmonary effort is normal. No respiratory distress.  Skin:    General: Skin is warm and dry.  Neurological:     General: No focal deficit present.     Mental Status: She is alert and oriented to person, place, and time. Mental status is at baseline.  Psychiatric:        Mood and Affect: Mood normal.        Behavior: Behavior normal.     CBC today.      Assessment & Plan:  Return in  54months, sooner if needed.

## 2020-09-22 NOTE — Patient Instructions (Signed)
Christine [name] - -  Thank you for coming in to see me today.    You do an excellent job keeping track of your blood pressure and blood sugar.  Please continue this trend.   For your rectal prolapse, I have placed a consult to see Surgery who can discuss with you the risks and benefits of a surgical correction.   We will also check your blood counts today.   I have refilled your test strips.   Thank you  Come back to see me in 3 months.

## 2020-09-23 LAB — CBC
Hematocrit: 33.1 % — ABNORMAL LOW (ref 34.0–46.6)
Hemoglobin: 11 g/dL — ABNORMAL LOW (ref 11.1–15.9)
MCH: 28.9 pg (ref 26.6–33.0)
MCHC: 33.2 g/dL (ref 31.5–35.7)
MCV: 87 fL (ref 79–97)
Platelets: 326 10*3/uL (ref 150–450)
RBC: 3.8 x10E6/uL (ref 3.77–5.28)
RDW: 13.2 % (ref 11.7–15.4)
WBC: 6.9 10*3/uL (ref 3.4–10.8)

## 2020-09-24 DIAGNOSIS — R2681 Unsteadiness on feet: Secondary | ICD-10-CM | POA: Insufficient documentation

## 2020-09-24 NOTE — Assessment & Plan Note (Signed)
She has an eye exam scheduled for 12/10.

## 2020-09-24 NOTE — Assessment & Plan Note (Signed)
A1C today is well controlled at 6.8.  Refill for test strips placed.

## 2020-09-24 NOTE — Assessment & Plan Note (Signed)
Blood pressure is mildly elevated today.  She has a wide pulse pressure and has had difficulty with lower blood pressures in the past.  We reviewed her daily BP from January which she brought for me to view.  Overall, I would continue her current therapy.   Continue amlodipine, valsartan

## 2020-09-24 NOTE — Assessment & Plan Note (Signed)
This continues to be a major problem for her, making ambulation difficult for her.  She notes that the physical therapy has helped a tad, but she would like to meet with a surgeon to discuss definitive therapy.  She further has some bleeding related to this.  Will check CBC today and place a referral for general surgery.

## 2020-09-24 NOTE — Assessment & Plan Note (Signed)
She has PMR, rectal prolapse, chronic pain.  She requires a rollator walker for gait instability outside of the hospital.  Will place DME order today.

## 2020-09-24 NOTE — Assessment & Plan Note (Signed)
She has recently gotten injections for this issue and her pain is well controlled.

## 2020-09-25 ENCOUNTER — Ambulatory Visit: Payer: Medicare Other | Admitting: Physical Therapy

## 2020-09-25 ENCOUNTER — Other Ambulatory Visit: Payer: Self-pay

## 2020-09-25 ENCOUNTER — Encounter: Payer: Self-pay | Admitting: Physical Therapy

## 2020-09-25 DIAGNOSIS — M6281 Muscle weakness (generalized): Secondary | ICD-10-CM | POA: Diagnosis not present

## 2020-09-25 DIAGNOSIS — R279 Unspecified lack of coordination: Secondary | ICD-10-CM | POA: Diagnosis not present

## 2020-09-25 DIAGNOSIS — K623 Rectal prolapse: Secondary | ICD-10-CM

## 2020-09-25 NOTE — Patient Instructions (Signed)
Access Code: [email or web address] URL: [email or web address] Date: 09/25/2020 Prepared by: Earlie Counts  Exercises Seated Pelvic Floor Contraction - 1 x daily - 7 x weekly - 1 sets - 10 reps - 10 sec hold Sit to Stand with Pelvic Floor Contraction - 1 x daily - 7 x weekly - 1 sets - 10 reps [facility] [address], [place], [place] [address] Phone # [phone] Fax [phone]

## 2020-09-25 NOTE — Therapy (Signed)
[facility] [address], [place], [place], [address] Phone: [phone]   Fax:  [phone]  Physical Therapy Treatment  Patient Details  Name: Christine [name] MRN: [id number] Date of Birth: 01/24/1934 Referring Provider (PT): Dr. Larkspur Cellar   Encounter Date: 09/25/2020   PT End of Session - 09/25/20 1058    Visit Number 7    Date for PT Re-Evaluation 10/23/20    Authorization Type Medicare/Medicaid    Authorization - Visit Number 7    Authorization - Number of Visits 10    PT Start Time 8921    PT Stop Time 1055    PT Time Calculation (min) 40 min    Activity Tolerance Patient tolerated treatment well    Behavior During Therapy [facility] for tasks assessed/performed           Past Medical History:  Diagnosis Date   Anemia    Anginal pain ([place])    Arthritis    "back, arms, hips; hands" (11/30/2015)   Benign hypertensive heart and kidney disease with diastolic CHF, NYHA class II and CKD stage III ([place])    Blood transfusion 1972   "after daughter born, attempted to give me blood; couldn't give it cause my blood was cold" (09/23/2013)   CAD (coronary artery disease)    a. LHC 08/23/11: dLM 40-50%, oRI 40%, oCFX 40%, oD1 70% (small and not amenable to PCI).  LM lesion did not appear to be flow limiting.  Medical rx was recommended;  b. Echo 08/23/11: mild LVH, EF 60-65%, grade 1 diast dysfxn, mild BAE, PASP 24;  c. 02/2012  Cath: LM 50d, LAD 50p, D1 70ost, RI 70p, RCA ok->Med Rx;  d. 01/2013 Cardiolite: EF 88, no ischemia/infarct.   Carotid stenosis    a. dopplers 10/12:  0-39% bilat ICA;  b. 10/2012 U/S: 0-39% bilat, f/u 1 yr (10/2013).   Chronic bronchitis    Depression    Diverticulosis of colon (without mention of hemorrhage)    GERD (gastroesophageal reflux disease)    Heart murmur, systolic    2-D echo in December 2011 showed a normal EF with grade 1 diastolic dysfunction, trivial pulmonary  regurgitation and mildly elevated PA pressure at 37 mmHg probably secondary to her COPD.dynamic obstruction-mid cavity obliteration;  b. 02/2012 Echo: EF 60-65%, mild LVH, PASP 94mmHg.   History of stomach ulcers 1970's   Hyperlipidemia    Hypertension    Hypothyroidism    Migraines    a. Next atypical symptoms in the past. Patient was started on Neurontin for possible neuropathic origin of her pain.   Polymyalgia rheumatica (HCC)    Shortness of breath on exertion    "just related to angina >1 yr ago" (09/23/2013)   Sinus arrhythmia    Type II diabetes mellitus ([place])    a. On oral hypoglycemic agents.    Past Surgical History:  Procedure Laterality Date   CARDIAC CATHETERIZATION N/A 09/26/2015   Procedure: Right/Left Heart Cath and Coronary Angiography;  Surgeon: Larey Dresser, MD;  Location: [place] [facility];  Service: Cardiovascular;  Laterality: N/A;   CATARACT EXTRACTION W/ INTRAOCULAR LENS  IMPLANT, BILATERAL Bilateral 1990's   LEFT HEART CATHETERIZATION WITH CORONARY ANGIOGRAM N/A 03/16/2012   Procedure: LEFT HEART CATHETERIZATION WITH CORONARY ANGIOGRAM;  Surgeon: Hillary Bow, MD;  Location: [facility] CATH LAB;  Service: Cardiovascular;  Laterality: N/A;   RIGHT/LEFT HEART CATH AND CORONARY ANGIOGRAPHY N/A 08/12/2017   Procedure: RIGHT/LEFT HEART CATH AND CORONARY  ANGIOGRAPHY;  Surgeon: Martinique, Peter M, MD;  Location: [place] [facility];  Service: Cardiovascular;  Laterality: N/A;   VAGINAL HYSTERECTOMY  1976    There were no vitals filed for this visit.   Subjective Assessment - 09/25/20 1019    Subjective The prolapse is the same. Patient is not having rectal pain. Understands correct toileting technique. Not able to fully evacuate the stool. Patient reports a littel stool leakage. Sometimes the stool leakage is less.    Patient Stated Goals reduce her prolapse    Currently in Pain? No/denies                          Pelvic Floor Special  Questions - 09/25/20 0001    Biofeedback sicely resting tone 2.5 uv, contract to quick contration to 14 uv, contract 10 seconds at 12 uv, 15 sec 12 uv, 20 sec at 10 uv,  holding breath so had her count out loud, hold 20 sec with counting out loud went to 8 uv, sitting  contract 10 sec 14 uv, contract the pelvic floor when going from sit to stand     Biofeedback sensor type Surface   rectal                     PT Education - 09/25/20 1056    Education Details Access Code: [email or web address]    Person(s) Educated Patient    Methods Explanation;Demonstration;Handout    Comprehension Returned demonstration;Verbalized understanding            PT Short Term Goals - 09/25/20 1020      PT SHORT TERM GOAL #1   Title independent with initial HEP with pelvic floor contraction    Time 4    Status Achieved      PT SHORT TERM GOAL #2   Title abitliy to contract the pelvic floor and lower abdominal to improve continence    Time 4    Period Weeks    Status Achieved    Target Date 07/03/20      PT SHORT TERM GOAL #3   Title understand correct toilet technique to fully evacuate his bowels    Time 4    Period Weeks    Status Achieved    Target Date 07/03/20      PT SHORT TERM GOAL #4   Title understand bowel health    Time 4    Period Weeks    Status Achieved             PT Long Term Goals - 09/25/20 1022      PT LONG TERM GOAL #1   Title independent with HEP and understand how to progress herself    Time 12    Period Weeks    Status On-going      PT LONG TERM GOAL #2   Title improved fecal leakage >/= 50% due to improved strength    Time 8    Period Weeks    Status Achieved      PT LONG TERM GOAL #3   Title rectal prolpase reduce 50% of the time due to imrpoved pelvic floor strength >/= 3/5 holding or 10 seconds    Time 12    Period Weeks    Status On-going      PT LONG TERM GOAL #4   Title understand ways to manage rectal prolpase     Time 12    Period Weeks  Plan - 09/25/20 1053    Clinical Impression Statement Patient had a significant rectal prolapse when the therapist was placing the surface electrode around the anus. The therapist was able to reduce the prolapse. Patient is able to hold a pelvic floor contraction for 20 seconds at 10 uv in sidely. She is able ot contract pelvic floro with sit to stand and rolling on the mat. Patient does need verbal cues to not hold her breath. Patient has tried to toilet with her feet on a stool but still has difficulty with fully emptying her bowels. Patient will benefit from skilled therapy to improve pelvic floor strength and toileting technique.    Personal Factors and Comorbidities Comorbidity 3+;Age;Fitness;Time since onset of injury/illness/exacerbation    Comorbidities diabetes, polymyalgia rheumatica; vaginal hysterectomy; heart murmur; Hypothyroidism    Examination-Activity Limitations Continence;Sit;Locomotion Level;Transfers    Examination-Participation Restrictions Community Activity    Stability/Clinical Decision Making Evolving/Moderate complexity    Clinical Impairments Affecting Rehab Potential Polymyalgia rhumatica; osteopenia; diverticulosis; hypothyroidism, diabetes; vaginal hysterectomy; cardiac catherization    PT Frequency 1x / week    PT Duration 12 weeks    PT Treatment/Interventions Biofeedback;Therapeutic exercise;Therapeutic activities;Neuromuscular re-education;Patient/family education;Manual techniques;Passive range of motion;Energy conservation    PT Next Visit Plan continue using the pelvic floor EMG with some core activation and standing with movement    PT Home Exercise Plan Access Code: [email or web address]    Consulted and Agree with Plan of Care Patient           Patient will benefit from skilled therapeutic intervention in order to improve the following deficits and impairments:  Increased fascial restricitons, Pain, Decreased coordination, Increased muscle spasms,  Decreased activity tolerance, Decreased endurance, Decreased strength, Impaired flexibility  Visit Diagnosis: Muscle weakness (generalized)  Unspecified lack of coordination  Rectal prolapse     Problem List Patient Active Problem List   Diagnosis Date Noted   Gait instability 09/24/2020   Coronary artery disease involving native coronary artery of native heart with angina pectoris ([place]) 06/23/2020   Cervical radiculopathy at C7 06/23/2020   Chronic diastolic (congestive) heart failure ([place]) 01/13/2019   PAT (paroxysmal atrial tachycardia) ([place]) 01/13/2019   CKD stage 3 due to type 2 diabetes mellitus ([place]) 01/13/2019   Rectal prolapse 06/24/2018   MDD (major depressive disorder), recurrent, in full remission ([place]) 11/30/2016   Elevated alkaline phosphatase level 09/18/2016   Hypertensive retinopathy of both eyes, grade 2 04/20/2015   Lumbar spondylosis 06/16/2014   Carpal tunnel syndrome 02/18/2014   Osteopenia 01/11/2013   Routine adult health maintenance 01/11/2013   Diverticulosis 09/18/2012   Allergic rhinitis 02/13/2012   HEART MURMUR, SYSTOLIC 37/08/6268   Solitary pulmonary nodule 12/15/2006   Hyperlipidemia 11/28/2006   Hypothyroidism 09/04/2006   Type 2 diabetes mellitus, controlled ([place]) 09/04/2006   Migraine 09/04/2006   Essential hypertension 09/04/2006   GERD 09/04/2006    Earlie Counts, PT 09/25/20 11:03 AM   [facility] [address], [place], [place], [address] Phone: [phone]   Fax:  [phone]  Name: Christine [name] MRN: [id number] Date of Birth: 01/24/1934

## 2020-09-27 ENCOUNTER — Encounter: Payer: Self-pay | Admitting: Internal Medicine

## 2020-09-27 ENCOUNTER — Other Ambulatory Visit: Payer: Self-pay | Admitting: Internal Medicine

## 2020-09-27 DIAGNOSIS — K623 Rectal prolapse: Secondary | ICD-10-CM

## 2020-09-27 DIAGNOSIS — E119 Type 2 diabetes mellitus without complications: Secondary | ICD-10-CM | POA: Diagnosis not present

## 2020-09-27 DIAGNOSIS — Z0000 Encounter for general adult medical examination without abnormal findings: Secondary | ICD-10-CM

## 2020-09-28 NOTE — Telephone Encounter (Signed)
-----   Message from [name], MD sent at 09/27/2020  2:00 PM EST ----- Hey all - -  I need Christine [name] to come in for a lab only appointment the week of 11/29.    Holley Raring - I sent her a mychart message, but could you call her to make sure she is aware.  I would like to recheck her blood counts.   Thank you!  I have put in a future order.

## 2020-09-28 NOTE — Telephone Encounter (Signed)
Called pt - lab appt scheduled Tuesday Nov 30 @ 0900 AM.

## 2020-10-02 ENCOUNTER — Other Ambulatory Visit: Payer: Self-pay

## 2020-10-02 ENCOUNTER — Encounter: Payer: Self-pay | Admitting: Physical Therapy

## 2020-10-02 ENCOUNTER — Ambulatory Visit: Payer: Medicare Other | Admitting: Physical Therapy

## 2020-10-02 DIAGNOSIS — R279 Unspecified lack of coordination: Secondary | ICD-10-CM | POA: Diagnosis not present

## 2020-10-02 DIAGNOSIS — K623 Rectal prolapse: Secondary | ICD-10-CM | POA: Diagnosis not present

## 2020-10-02 DIAGNOSIS — M6281 Muscle weakness (generalized): Secondary | ICD-10-CM | POA: Diagnosis not present

## 2020-10-02 NOTE — Therapy (Signed)
[facility] [address], [place], [place], [address] Phone: [phone]   Fax:  [phone]  Physical Therapy Treatment  Patient Details  Name: Christine [name] MRN: [id number] Date of Birth: 01/24/1934 Referring Provider (PT): Dr. Gleed Cellar   Encounter Date: 10/02/2020   PT End of Session - 10/02/20 1027    Visit Number 8    Date for PT Re-Evaluation 10/23/20    Authorization Type Medicare/Medicaid    Authorization - Visit Number 8    Authorization - Number of Visits 10    PT Start Time 7741   came late   PT Stop Time 1055    PT Time Calculation (min) 33 min    Activity Tolerance Patient tolerated treatment well    Behavior During Therapy [facility] for tasks assessed/performed           Past Medical History:  Diagnosis Date  . Anemia   . Anginal pain ([place])   . Arthritis    "back, arms, hips; hands" (11/30/2015)  . Benign hypertensive heart and kidney disease with diastolic CHF, NYHA class II and CKD stage III ([place])   . Blood transfusion 1972   "after daughter born, attempted to give me blood; couldn't give it cause my blood was cold" (09/23/2013)  . CAD (coronary artery disease)    a. LHC 08/23/11: dLM 40-50%, oRI 40%, oCFX 40%, oD1 70% (small and not amenable to PCI).  LM lesion did not appear to be flow limiting.  Medical rx was recommended;  b. Echo 08/23/11: mild LVH, EF 60-65%, grade 1 diast dysfxn, mild BAE, PASP 24;  c. 02/2012  Cath: LM 50d, LAD 50p, D1 70ost, RI 70p, RCA ok->Med Rx;  d. 01/2013 Cardiolite: EF 88, no ischemia/infarct.  . Carotid stenosis    a. dopplers 10/12:  0-39% bilat ICA;  b. 10/2012 U/S: 0-39% bilat, f/u 1 yr (10/2013).  . Chronic bronchitis   . Depression   . Diverticulosis of colon (without mention of hemorrhage)   . GERD (gastroesophageal reflux disease)   . Heart murmur, systolic    2-D echo in December 2011 showed a normal EF with grade 1 diastolic dysfunction, trivial  pulmonary regurgitation and mildly elevated PA pressure at 37 mmHg probably secondary to her COPD.dynamic obstruction-mid cavity obliteration;  b. 02/2012 Echo: EF 60-65%, mild LVH, PASP 37mHg.  .[place] KitchenHistory of stomach ulcers 1970's  . Hyperlipidemia   . Hypertension   . Hypothyroidism   . Migraines    a. Next atypical symptoms in the past. Patient was started on Neurontin for possible neuropathic origin of her pain.  . Pol[place]algia rheumatica ([place]   . Shortness of breath on exertion    "just related to angina >1 yr ago" (09/23/2013)  . Sinus arrhythmia   . Type II diabetes mellitus ([place]    a. On oral hypoglycemic agents.    Past Surgical History:  Procedure Laterality Date  . CARDIAC CATHETERIZATION N/A 09/26/2015   Procedure: Right/Left Heart Cath and Coronary Angiography;  Surgeon: DLarey Dresser MD;  [facility];  Service: Cardiovascular;  Laterality: N/A;  . CATARACT EXTRACTION W/ INTRAOCULAR LENS  IMPLANT, BILATERAL Bilateral 1990's  . LEFT HEART CATHETERIZATION WITH CORONARY ANGIOGRAM N/A 03/16/2012   Procedure: LEFT HEART CATHETERIZATION WITH CORONARY ANGIOGRAM;  Surgeon: THillary Bow MD;  [facility];  Service: Cardiovascular;  Laterality: N/A;  . RIGHT/LEFT HEART CATH AND CORONARY ANGIOGRAPHY N/A 08/12/2017   Procedure: RIGHT/LEFT HEART  CATH AND CORONARY ANGIOGRAPHY;  Surgeon: Martinique, Peter M, MD;  Location: [place] [facility];  Service: Cardiovascular;  Laterality: N/A;  . VAGINAL HYSTERECTOMY  1976    There were no vitals filed for this visit.   Subjective Assessment - 10/02/20 1026    Subjective I am seeing a surgeon for the prolapse in December. No changes.    Patient Stated Goals reduce her prolapse    Currently in Pain? No/denies              [facility] PT Assessment - 10/02/20 0001      Assessment   Medical Diagnosis K62.3 REctal prolapse; K21.9 Gatroesphageal reflux disease, unspedfied whether  esphagitis present    Referring  Provider (PT) Dr. [place] Cellar    Onset Date/Surgical Date 05/18/18    Hand Dominance Right    Prior Therapy yes      Precautions   Precautions None      Restrictions   Weight Bearing Restrictions No      Prior Function   Level of Independence Independent    Vocation Retired      Associate Professor   Overall Cognitive Status Within Functional Limits for tasks assessed      PROM   Right Hip External Rotation  50    Left Hip External Rotation  50      Strength   Right Hip Extension 4+/5    Right Hip External Rotation  5/5    Right Hip ABduction 3+/5    Left Hip Extension 4+/5    Left Hip External Rotation 5/5    Left Hip ABduction 4+/5    Right Knee Flexion 5/5                      Pelvic Floor Special Questions - 10/02/20 0001    Urinary Leakage No    Fecal incontinence Yes   wears one per day            [facility] Adult PT Treatment/Exercise - 10/02/20 0001      Lumbar Exercises: Seated   Other Seated Lumbar Exercises pelvic floor contraction holding for 5 sec 10x      Lumbar Exercises: Supine   Bridge with Ball Squeeze 15 reps;1 second    Bridge with [employer] Limitations with anal contraction    Isometric Hip Flexion 10 reps    Isometric Hip Flexion Limitations each leg with pelvic floor contraction    Other Supine Lumbar Exercises supine ball squeeze holding for 5 sec, squeeze the anus and count out loud for 5 sec      Lumbar Exercises: Sidelying   Clam Right;Left;10 reps;1 second    Clam Limitations with pelvic floor contraction      Knee/Hip Exercises: Standing   Other Standing Knee Exercises sit to stand with pelvic floor contraction 10x, then sit to stand holding for 5 sed 5 times                  PT Education - 10/02/20 1052    Education Details Access Code: [email or web address]    Person(s) Educated Patient    Methods Explanation;Demonstration;Handout    Comprehension Verbalized understanding;Returned demonstration            PT Short  Term Goals - 09/25/20 1020      PT SHORT TERM GOAL #1   Title independent with initial HEP with pelvic floor contraction    Time 4    Status Achieved  PT SHORT TERM GOAL #2   Title abitliy to contract the pelvic floor and lower abdominal to improve continence    Time 4    Period Weeks    Status Achieved    Target Date 07/03/20      PT SHORT TERM GOAL #3   Title understand correct toilet technique to fully evacuate his bowels    Time 4    Period Weeks    Status Achieved    Target Date 07/03/20      PT SHORT TERM GOAL #4   Title understand bowel health    Time 4    Period Weeks    Status Achieved             PT Long Term Goals - 10/02/20 1028      PT LONG TERM GOAL #1   Title independent with HEP and understand how to progress herself    Time 12    Period Weeks    Status Achieved      PT LONG TERM GOAL #2   Title improved fecal leakage >/= 50% due to improved strength    Baseline reports it is 60% better    Time 8    Period Weeks    Status Achieved      PT LONG TERM GOAL #3   Title rectal prolpase reduce 50% of the time due to imrpoved pelvic floor strength >/= 3/5 holding or 10 seconds    Baseline no change, going to see a surgeon    Time 12    Period Weeks    Status Not Met      PT LONG TERM GOAL #4   Title understand ways to manage rectal prolpase     Time 12    Period Weeks    Status Achieved                 Plan - 10/02/20 1027    Clinical Impression Statement Patient reports she has not change with her prolapse. Therapist has seen the prolapse rectally and is is reducible. Her fecal leakage is 50% better. Patient is doing her HEP and feels stronger. Patient will be seeing a surgeon about her rectal prolapse. Patient has improved strength of the pelvic floor due to less leakage. Patient understands how to manage the prolapse.    Personal Factors and Comorbidities Comorbidity 3+;Age;Fitness;Time since onset of injury/illness/exacerbation      Comorbidities diabetes, polymyalgia rheumatica; vaginal hysterectomy; heart murmur; Hypothyroidism    Examination-Activity Limitations Continence;Sit;Locomotion Level;Transfers    Examination-Participation Restrictions Community Activity    Stability/Clinical Decision Making Evolving/Moderate complexity    Rehab Potential Good    Clinical Impairments Affecting Rehab Potential Polymyalgia rhumatica; osteopenia; diverticulosis; hypothyroidism, diabetes; vaginal hysterectomy; cardiac catherization    PT Treatment/Interventions Biofeedback;Therapeutic exercise;Therapeutic activities;Neuromuscular re-education;Patient/family education;Manual techniques;Passive range of motion;Energy conservation    PT Next Visit Plan discharge to HEP    PT Home Exercise Plan Access Code: [email or web address]    Consulted and Agree with Plan of Care Patient           Patient will benefit from skilled therapeutic intervention in order to improve the following deficits and impairments:  Increased fascial restricitons, Pain, Decreased coordination, Increased muscle spasms, Decreased activity tolerance, Decreased endurance, Decreased strength, Impaired flexibility  Visit Diagnosis: Muscle weakness (generalized)  Unspecified lack of coordination  Rectal prolapse     Problem List Patient Active Problem List   Diagnosis Date Noted  . Gait instability 09/24/2020  . Coronary  artery disease involving native coronary artery of native heart with angina pectoris ([place]) 06/23/2020  . Cervical radiculopathy at C7 06/23/2020  . Chronic diastolic (congestive) heart failure ([place]) 01/13/2019  . PAT (paroxysmal atrial tachycardia) ([place]) 01/13/2019  . CKD stage 3 due to type 2 diabetes mellitus ([place]) 01/13/2019  . Rectal prolapse 06/24/2018  . MDD (major depressive disorder), recurrent, in full remission ([place]) 11/30/2016  . Elevated alkaline phosphatase level 09/18/2016  . Hypertensive retinopathy of both eyes, grade 2 04/20/2015   . Lumbar spondylosis 06/16/2014  . Carpal tunnel syndrome 02/18/2014  . Osteopenia 01/11/2013  . Routine adult health maintenance 01/11/2013  . Diverticulosis 09/18/2012  . Allergic rhinitis 02/13/2012  . HEART MURMUR, SYSTOLIC 88/89/1694  . Solitary pulmonary nodule 12/15/2006  . Hyperlipidemia 11/28/2006  . Hypothyroidism 09/04/2006  . Type 2 diabetes mellitus, controlled ([place]) 09/04/2006  . Migraine 09/04/2006  . Essential hypertension 09/04/2006  . GERD 09/04/2006    Earlie Counts, PT 10/02/20 11:00 AM   [facility] [address] [place], [place], [address] Phone: [phone]   Fax:  [phone]  Name: Christine [name] MRN: [id number] Date of Birth: 01/24/1934  PHYSICAL THERAPY DISCHARGE SUMMARY  Visits from Start of Care: 8  Current functional level related to goals / functional outcomes: See above.    Remaining deficits: See above.. Patient continues to have the rectal prolapse and will be seeing a surgeon for it.    Education / Equipment: HEP Plan: Patient agrees to discharge.  Patient goals were partially met. Patient is being discharged due to being pleased with the current functional level. Thank you for the referral. Earlie Counts, PT 10/02/20 11:01 AM   ?????

## 2020-10-02 NOTE — Patient Instructions (Signed)
Access Code: [email or web address] URL: [email or web address] Date: 10/02/2020 Prepared by: Earlie Counts  Exercises Supine Bridge with [employer] Between Knees - 1 x daily - 4 x weekly - 1 sets - 15 reps Hooklying Isometric Hip Flexion - 1 x daily - 4 x weekly - 1 sets - 5 reps - 5 sec hold Supine Hip Adduction Isometric with Ball - 1 x daily - 4 x weekly - 2 sets - 5 reps - 5 sec hold Clamshell - 1 x daily - 4 x weekly - 1 sets - 10 reps Seated Pelvic Floor Contraction - 1 x daily - 7 x weekly - 1 sets - 10 reps - 5 sec hold Sit to Stand with Pelvic Floor Contraction - 1 x daily - 7 x weekly - 1 sets - 10 reps - 5 sec hold [facility] [address], [place], [place] [address] Phone # [phone] Fax [phone]

## 2020-10-09 ENCOUNTER — Encounter: Payer: Medicare Other | Admitting: Physical Therapy

## 2020-10-16 ENCOUNTER — Encounter: Payer: Medicare Other | Admitting: Physical Therapy

## 2020-10-17 ENCOUNTER — Other Ambulatory Visit (INDEPENDENT_AMBULATORY_CARE_PROVIDER_SITE_OTHER): Payer: Medicare Other

## 2020-10-17 DIAGNOSIS — K623 Rectal prolapse: Secondary | ICD-10-CM | POA: Diagnosis not present

## 2020-10-18 LAB — CBC
Hematocrit: 36.7 % (ref 34.0–46.6)
Hemoglobin: 11.7 g/dL (ref 11.1–15.9)
MCH: 28.3 pg (ref 26.6–33.0)
MCHC: 31.9 g/dL (ref 31.5–35.7)
MCV: 89 fL (ref 79–97)
Platelets: 377 10*3/uL (ref 150–450)
RBC: 4.13 x10E6/uL (ref 3.77–5.28)
RDW: 12.8 % (ref 11.7–15.4)
WBC: 7.5 10*3/uL (ref 3.4–10.8)

## 2020-10-27 ENCOUNTER — Encounter: Payer: Self-pay | Admitting: Internal Medicine

## 2020-11-01 ENCOUNTER — Telehealth: Payer: Self-pay

## 2020-11-01 NOTE — Telephone Encounter (Signed)
Pls contact pt regarding picking up the wrong wheelchair [phone]

## 2020-11-02 DIAGNOSIS — K623 Rectal prolapse: Secondary | ICD-10-CM | POA: Diagnosis not present

## 2020-11-03 NOTE — Telephone Encounter (Signed)
Returned call to patient. States [employer] delivered a w/c but she wanted a rollator. States she received the rollator but needs someone from [employer] to p/u the w/c as she does not want it. CM sent to [facility] at [place]. Hubbard Hartshorn, BSN, RN-BC

## 2020-11-08 ENCOUNTER — Ambulatory Visit
Admission: RE | Admit: 2020-11-08 | Discharge: 2020-11-08 | Disposition: A | Discharge status: Home / Self Care | Payer: Medicare Other | Source: Ambulatory Visit | Attending: Internal Medicine | Admitting: Internal Medicine

## 2020-11-08 ENCOUNTER — Other Ambulatory Visit: Payer: Self-pay

## 2020-11-08 DIAGNOSIS — Z1231 Encounter for screening mammogram for malignant neoplasm of breast: Secondary | ICD-10-CM | POA: Diagnosis not present

## 2020-11-08 DIAGNOSIS — Z0000 Encounter for general adult medical examination without abnormal findings: Secondary | ICD-10-CM

## 2020-11-08 NOTE — Telephone Encounter (Signed)
-----   Message -----  From: Skeet Latch  Sent: 11/03/2020  2:42 PM EST  To: Darlina Guys, Kem Kays, *  Subject: RE: Rolator walker                  I will create a pick up ticket for the patient

## 2020-11-24 ENCOUNTER — Encounter: Payer: Medicare Other | Admitting: Internal Medicine

## 2020-11-27 ENCOUNTER — Encounter: Payer: Medicare Other | Admitting: Student

## 2020-11-28 ENCOUNTER — Other Ambulatory Visit: Payer: Self-pay | Admitting: Internal Medicine

## 2020-11-28 DIAGNOSIS — E039 Hypothyroidism, unspecified: Secondary | ICD-10-CM

## 2020-12-25 ENCOUNTER — Other Ambulatory Visit: Payer: Self-pay | Admitting: *Deleted

## 2020-12-25 DIAGNOSIS — E785 Hyperlipidemia, unspecified: Secondary | ICD-10-CM

## 2020-12-25 MED ORDER — ATORVASTATIN CALCIUM 40 MG PO TABS: ORAL_TABLET | ORAL | 3 refills | Status: DC

## 2021-01-15 ENCOUNTER — Other Ambulatory Visit: Payer: Self-pay | Admitting: *Deleted

## 2021-01-15 ENCOUNTER — Other Ambulatory Visit: Payer: Self-pay | Admitting: Internal Medicine

## 2021-01-16 MED ORDER — FLUTICASONE-SALMETEROL 250-50 MCG/DOSE IN AEPB: INHALATION_SPRAY | RESPIRATORY_TRACT | 3 refills | Status: DC

## 2021-01-22 NOTE — Telephone Encounter (Signed)
Called pt to set up appointment. Able to make appointment for pt on May 01, 2021 at 11:40pm. Pt repeats date/time back and verbalizes understanding.

## 2021-01-24 ENCOUNTER — Other Ambulatory Visit: Payer: Self-pay | Admitting: Internal Medicine

## 2021-02-02 ENCOUNTER — Ambulatory Visit (INDEPENDENT_AMBULATORY_CARE_PROVIDER_SITE_OTHER): Payer: Medicare Other | Admitting: Internal Medicine

## 2021-02-02 ENCOUNTER — Encounter: Payer: Self-pay | Admitting: Internal Medicine

## 2021-02-02 ENCOUNTER — Other Ambulatory Visit: Payer: Self-pay

## 2021-02-02 VITALS — BP 126/46 | HR 79 | Temp 97.6°F | Ht 64.0 in

## 2021-02-02 DIAGNOSIS — I10 Essential (primary) hypertension: Secondary | ICD-10-CM

## 2021-02-02 DIAGNOSIS — I25119 Atherosclerotic heart disease of native coronary artery with unspecified angina pectoris: Secondary | ICD-10-CM

## 2021-02-02 DIAGNOSIS — E785 Hyperlipidemia, unspecified: Secondary | ICD-10-CM

## 2021-02-02 DIAGNOSIS — K623 Rectal prolapse: Secondary | ICD-10-CM

## 2021-02-02 DIAGNOSIS — E119 Type 2 diabetes mellitus without complications: Secondary | ICD-10-CM

## 2021-02-02 DIAGNOSIS — I5032 Chronic diastolic (congestive) heart failure: Secondary | ICD-10-CM

## 2021-02-02 DIAGNOSIS — E039 Hypothyroidism, unspecified: Secondary | ICD-10-CM

## 2021-02-02 DIAGNOSIS — I471 Supraventricular tachycardia: Secondary | ICD-10-CM

## 2021-02-02 DIAGNOSIS — G43809 Other migraine, not intractable, without status migrainosus: Secondary | ICD-10-CM | POA: Diagnosis not present

## 2021-02-02 DIAGNOSIS — E1122 Type 2 diabetes mellitus with diabetic chronic kidney disease: Secondary | ICD-10-CM | POA: Diagnosis not present

## 2021-02-02 DIAGNOSIS — I4719 Other supraventricular tachycardia: Secondary | ICD-10-CM

## 2021-02-02 DIAGNOSIS — M316 Other giant cell arteritis: Secondary | ICD-10-CM | POA: Diagnosis not present

## 2021-02-02 DIAGNOSIS — N183 Chronic kidney disease, stage 3 unspecified: Secondary | ICD-10-CM | POA: Diagnosis not present

## 2021-02-02 LAB — POCT GLYCOSYLATED HEMOGLOBIN (HGB A1C): Hemoglobin A1C: 7.2 % — AB (ref 4.0–5.6)

## 2021-02-02 LAB — GLUCOSE, CAPILLARY: Glucose-Capillary: 163 mg/dL — ABNORMAL HIGH (ref 70–99)

## 2021-02-02 MED ORDER — GLIMEPIRIDE 1 MG PO TABS
ORAL_TABLET | ORAL | 3 refills | Status: DC
Start: 2021-02-02 — End: 2022-01-29

## 2021-02-02 MED ORDER — ATORVASTATIN CALCIUM 40 MG PO TABS: ORAL_TABLET | ORAL | 3 refills | Status: DC

## 2021-02-02 NOTE — Progress Notes (Signed)
 Subjective:    [name], female    DOB: 01/24/1934, 87 y.o.   MRN: [id number]  4 month follow up for DM2 and CAD  HPI  Christine [name] is an 87 year old woman with PMH of HFpEF, CKD 3, PAT, CAD, HTN, GERD, HLD, hypothyroidism who presents for follow up.   Christine [name] has a history of rectal prolapse.  At last visit, she had requested a referral to surgery for evaluation.  The surgeon did not feel that surgery was a good option given age and other medical conditions.  She will continue to do kegel exercises and making sure she does not get constipated.  She noted good stools, no straining.  She does have thigh pain when doing her exercises.    She further notes a headache today which started while in the waiting room.  The pain is frontal, no photophobia or eye pain.  She has no blurry vision.  She notes that she thinks it is allergies.  She will call if it worsens.    Otherwise, doing well.  We reviewed her blood sugar and blood pressure logs.  She has higher systolic pressures, but this is in the setting of lower diastolic pressures and history of orthostatic hypotension in the past.  She is due to see her Cardiologist Dr.  in May.  She denies chest pain, palpitations.  Her systolic pressure was 126 at this visit in the setting of the headache, so I do not think this is the cause.   We also reviewed her medications and I will send in requested refills for glimepiride and lipitor.    Review of Systems  Constitutional: Negative for activity change, appetite change and fever.  HENT: Positive for congestion and sinus pressure.   Eyes: Negative for discharge, itching and visual disturbance.  Respiratory: Negative for cough and choking.   Cardiovascular: Negative for chest pain and leg swelling.  Gastrointestinal: Negative for abdominal distention and constipation.  Musculoskeletal: Positive for arthralgias, back pain and myalgias (thigh pain bilaterally).  Neurological:  Positive for headaches. Negative for dizziness and weakness.  Psychiatric/Behavioral: Negative for confusion and decreased concentration.       Objective:   Physical Exam Vitals and nursing note reviewed.  Constitutional:      General: She is not in acute distress.    Appearance: She is well-developed. She is not toxic-appearing.  HENT:     Head: Normocephalic and atraumatic.  Eyes:     General: No scleral icterus.    Pupils: Pupils are equal.  Cardiovascular:     Rate and Rhythm: Normal rate and regular rhythm.  Pulmonary:     Effort: Pulmonary effort is normal.     Breath sounds: Normal breath sounds.  Musculoskeletal:     Cervical back: Neck supple.  Skin:    General: Skin is warm and dry.     Coloration: Skin is not cyanotic or pale.  Neurological:     Mental Status: She is alert and oriented to person, place, and time. Mental status is at baseline.     GCS: GCS eye subscore is 4. GCS verbal subscore is 5. GCS motor subscore is 6.     Cranial Nerves: No dysarthria or facial asymmetry.     Gait: Gait normal.  Psychiatric:        Mood and Affect: Mood normal.        Behavior: Behavior normal.     Lipid panel, CMET, CK and ESR   today.        Assessment & Plan:  Return to clinic 3 months.  Will call with results.   History of GCA/PMR - Recurrent thigh pain and headache today (though not classic) - Given on a statin will check a CK - Check ESR and discuss any labs with patient - May need to restart steroids vs. Refer to rheumatology

## 2021-02-02 NOTE — Patient Instructions (Signed)
Christine [name] - -  Thank you for coming in today.   I have refilled your glimepiride and lipitor.   Please come back to see me in 3 months.   I will call you if your blood work is abnormal.   Thank you!

## 2021-02-03 LAB — CMP14 + ANION GAP
ALT: 17 IU/L (ref 0–32)
AST: 29 IU/L (ref 0–40)
Albumin/Globulin Ratio: 1.7 (ref 1.2–2.2)
Albumin: 4.5 g/dL (ref 3.6–4.6)
Alkaline Phosphatase: 182 IU/L — ABNORMAL HIGH (ref 44–121)
Anion Gap: 20 mmol/L — ABNORMAL HIGH (ref 10.0–18.0)
BUN/Creatinine Ratio: 23 (ref 12–28)
BUN: 27 mg/dL (ref 8–27)
Bilirubin Total: 0.3 mg/dL (ref 0.0–1.2)
CO2: 18 mmol/L — ABNORMAL LOW (ref 20–29)
Calcium: 9.7 mg/dL (ref 8.7–10.3)
Chloride: 100 mmol/L (ref 96–106)
Creatinine, Ser: 1.19 mg/dL — ABNORMAL HIGH (ref 0.57–1.00)
Globulin, Total: 2.6 g/dL (ref 1.5–4.5)
Glucose: 150 mg/dL — ABNORMAL HIGH (ref 65–99)
Potassium: 4.5 mmol/L (ref 3.5–5.2)
Sodium: 138 mmol/L (ref 134–144)
Total Protein: 7.1 g/dL (ref 6.0–8.5)
eGFR: 45 mL/min/{1.73_m2} — ABNORMAL LOW (ref >59)

## 2021-02-03 LAB — CK: Total CK: 152 U/L (ref 26–161)

## 2021-02-03 LAB — LIPID PANEL
Chol/HDL Ratio: 3.3 ratio (ref 0.0–4.4)
Cholesterol, Total: 143 mg/dL (ref 100–199)
HDL: 43 mg/dL (ref >39)
LDL Chol Calc (NIH): 77 mg/dL (ref 0–99)
Triglycerides: 131 mg/dL (ref 0–149)
VLDL Cholesterol Cal: 23 mg/dL (ref 5–40)

## 2021-02-03 LAB — SEDIMENTATION RATE: Sed Rate: 66 mm/hr — ABNORMAL HIGH (ref 0–40)

## 2021-02-05 NOTE — Assessment & Plan Note (Addendum)
Currently HR is 79 with a normal rhythm.  She has not been able to tolerate beta blockers in the past due to dizziness. She is due for a repeat EKG, she will wait until she sees her Cardiologist in May to have this done.   Continue to monitor.

## 2021-02-05 NOTE — Assessment & Plan Note (Signed)
Last TSH in August was at goal for her.  We will recheck blood work in the summer.  She has no new complaints of fatigue or cold intolerance.  She is tolerating her synthroid.   Plan Continue synthroid.

## 2021-02-05 NOTE — Assessment & Plan Note (Signed)
Volume status is well controlled at this time.  She is no requiring a diuretic. Last TTE was in 2018 and showed grade 1 diastolic dysfunction.  Monitor.  Follow up with Cardiology

## 2021-02-05 NOTE — Assessment & Plan Note (Signed)
She denies any current chest pain or other changes.  No palpitations.  She is due to see her Cardiologist in May.  Continue to monitor.

## 2021-02-05 NOTE — Assessment & Plan Note (Signed)
Modestly well controlled at this time with exercises alone.  Given her thigh pain with exercise, will plan to check an ESR.

## 2021-02-05 NOTE — Assessment & Plan Note (Signed)
BP today is 126/46.  She has higher systolic pressures at home which she reports is due to dietary indiscretions.  We reviewed her BP log today.  I advised her to decrease salt intake.   Plan CMET today Continue amlodipine and valsartan.

## 2021-02-05 NOTE — Assessment & Plan Note (Signed)
She has a history of migraines.  Has been on imipramine since she was in her 49s and did not tolerate attempts to wean off this medication.   Possible migraine today, though more consistent with congestion and sinuses.   She will call if pain does not resolve with her pain medication and or sinus medication.

## 2021-02-05 NOTE — Assessment & Plan Note (Signed)
Will plan to check a CMET today in order to evaluate for worsening of her renal function.  She has no issues with urine being decreased or dysuria.   She is on an ARB Monitor

## 2021-02-05 NOTE — Assessment & Plan Note (Signed)
A1C is slightly higher today at 7.2.  She has been out of her glimepiride for a few days.  She is currently not on steroids.   Plan Check CMET for renal function Check Lipid profile Continue glimepiride Advised improved diet

## 2021-02-05 NOTE — Assessment & Plan Note (Signed)
LDL was 46 in 2018.  She is on lipitor without issue.   Plan Check lipid panel today.

## 2021-02-12 NOTE — Addendum Note (Signed)
Addended by: Gilles Chiquito B on: 02/12/2021 10:27 AM   Modules accepted: Orders

## 2021-02-15 ENCOUNTER — Encounter: Payer: Self-pay | Admitting: Internal Medicine

## 2021-02-15 DIAGNOSIS — M353 Polymyalgia rheumatica: Secondary | ICD-10-CM

## 2021-02-20 NOTE — Addendum Note (Signed)
Addended by: Gilles Chiquito B on: 02/20/2021 03:59 PM   Modules accepted: Orders

## 2021-02-22 NOTE — Addendum Note (Signed)
Addended by: Truddie Crumble on: 02/22/2021 11:10 AM   Modules accepted: Orders

## 2021-02-26 ENCOUNTER — Other Ambulatory Visit: Payer: Self-pay | Admitting: Internal Medicine

## 2021-03-02 DIAGNOSIS — K449 Diaphragmatic hernia without obstruction or gangrene: Secondary | ICD-10-CM | POA: Diagnosis not present

## 2021-03-02 DIAGNOSIS — J019 Acute sinusitis, unspecified: Secondary | ICD-10-CM | POA: Diagnosis not present

## 2021-03-02 DIAGNOSIS — R0600 Dyspnea, unspecified: Secondary | ICD-10-CM | POA: Diagnosis not present

## 2021-03-06 ENCOUNTER — Other Ambulatory Visit: Payer: Self-pay | Admitting: Internal Medicine

## 2021-03-06 ENCOUNTER — Telehealth: Payer: Self-pay

## 2021-03-06 NOTE — Telephone Encounter (Signed)
  Fluticasone-Salmeterol (ADVAIR) 250-50 MCG/DOSE AEPB, refill request @  [facility] [place], [place] [facility] Phone:  [phone]  Fax:  [phone]

## 2021-03-06 NOTE — Telephone Encounter (Signed)
Return pt's call to let her know she has refills on Advair. She stated the pharmacy told her our office had discontinued this medication.  I called [facility] who stated it had been closed so no more refills. I asked her to re-check pt's DOB b/c we have 1935 and they have Apr 10, 1933 ( I had mentioned this to them before also to pt). She stated her insurance has dob as Apr 08, 1933. And ins co will pay for Advair not fluticasone. They are out of stock of Advair; stated should arrive by tomorrow.  Pt called / informed of the above.

## 2021-03-14 ENCOUNTER — Other Ambulatory Visit: Payer: Self-pay | Admitting: Internal Medicine

## 2021-03-14 DIAGNOSIS — I10 Essential (primary) hypertension: Secondary | ICD-10-CM

## 2021-03-16 ENCOUNTER — Ambulatory Visit (INDEPENDENT_AMBULATORY_CARE_PROVIDER_SITE_OTHER): Payer: Medicare Other | Admitting: Student

## 2021-03-16 ENCOUNTER — Other Ambulatory Visit: Payer: Self-pay

## 2021-03-16 ENCOUNTER — Encounter: Payer: Self-pay | Admitting: Student

## 2021-03-16 DIAGNOSIS — R059 Cough, unspecified: Secondary | ICD-10-CM

## 2021-03-16 MED ORDER — AZITHROMYCIN 250 MG PO TABS: ORAL_TABLET | ORAL | 0 refills | Status: DC

## 2021-03-16 NOTE — Patient Instructions (Signed)
It was a pleasure seeing you in clinic. Today we discussed:   Cough Please take azithromycin for 5 days for you symptoms and continue drinking plenty of water. You can continue robitussin for congestion. I anticipate you will slowly start to feel better. Please follow up if your symptoms worsen or do no improve or you develop fever or severe shortness of breath.   If you have any questions or concerns, please call our clinic at [phone] between 9am-5pm and after hours call [phone] and ask for the internal medicine resident on call. If you feel you are having a medical emergency please call 911.   Thank you, we look forward to helping you remain healthy!

## 2021-03-17 NOTE — Progress Notes (Signed)
   CC: cough  HPI:  [name] is a 87 y.o. female with history listed below presents for cough for the past month. Please refer to problem based charting for further details and assessment and plan of current problem and chronic medical conditions.   Past Medical History:  Diagnosis Date  . Anemia   . Anginal pain ([place])   . Arthritis    "back, arms, hips; hands" (11/30/2015)  . Benign hypertensive heart and kidney disease with diastolic CHF, NYHA class II and CKD stage III ([place])   . Blood transfusion 1972   "after daughter born, attempted to give me blood; couldn't give it cause my blood was cold" (09/23/2013)  . CAD (coronary artery disease)    a. LHC 08/23/11: dLM 40-50%, oRI 40%, oCFX 40%, oD1 70% (small and not amenable to PCI).  LM lesion did not appear to be flow limiting.  Medical rx was recommended;  b. Echo 08/23/11: mild LVH, EF 60-65%, grade 1 diast dysfxn, mild BAE, PASP 24;  c. 02/2012  Cath: LM 50d, LAD 50p, D1 70ost, RI 70p, RCA ok->Med Rx;  d. 01/2013 Cardiolite: EF 88, no ischemia/infarct.  . Carotid stenosis    a. dopplers 10/12:  0-39% bilat ICA;  b. 10/2012 U/S: 0-39% bilat, f/u 1 yr (10/2013).  . Chronic bronchitis   . Depression   . Diverticulosis of colon (without mention of hemorrhage)   . GERD (gastroesophageal reflux disease)   . Heart murmur, systolic    2-D echo in December 2011 showed a normal EF with grade 1 diastolic dysfunction, trivial pulmonary regurgitation and mildly elevated PA pressure at 37 mmHg probably secondary to her COPD.dynamic obstruction-mid cavity obliteration;  b. 02/2012 Echo: EF 60-65%, mild LVH, PASP 31mmHg.  Marland Kitchen History of stomach ulcers 1970's  . Hyperlipidemia   . Hypertension   . Hypothyroidism   . Migraines    a. Next atypical symptoms in the past. Patient was started on Neurontin for possible neuropathic origin of her pain.  . Polymyalgia rheumatica ([place])   . Shortness of breath on exertion    "just related to angina >1 yr  ago" (09/23/2013)  . Sinus arrhythmia   . Type II diabetes mellitus ([place])    a. On oral hypoglycemic agents.   Review of Systems:  Negative as per HPI  Physical Exam:  Vitals:   03/16/21 1112  BP: (!) 131/50  Pulse: 77  Temp: 97.9 F (36.6 C)  TempSrc: Oral  SpO2: 100%  Weight: 129 lb 3.2 oz (58.6 kg)  Height: 5\' 6"  (1.676 m)   Constitutional: Appears tired. No acute distress HENT: Normocephalic and atraumatic, EOMI, conjunctiva normal, moist mucous membranes Cardiovascular: Normal rate, regular rhythm, S1 and S2 present, no murmurs, rubs, gallops.  Distal pulses intact Respiratory: No respiratory distress, no accessory muscle use.  Effort is normal.  Lungs are clear to auscultation bilaterally. No wheezing or rhonchi.  GI: Nondistended, soft, nontender to palpation, normal active bowel sounds Musculoskeletal: Normal bulk and tone.  No peripheral edema noted. Neurological: Is alert and oriented x4, no apparent focal deficits noted. Skin: Warm and dry.  No rash, erythema, lesions noted. Psychiatric: Normal mood and affect.   Assessment & Plan:   See Encounters Tab for problem based charting.  Patient discussed with Dr. Jimmye Norman

## 2021-03-17 NOTE — Assessment & Plan Note (Signed)
Patient presenting with 1 month of cough and runny nose. Notes she started having nasal congestion and runny nose about a month ago shortly after daughter developed similar symptoms. She went to urgent care 2 weeks ago and was prescribed 10 days of doxycycline. She was COVID negative at that time. Is up to date with COVID vaccine and flu. Due for pneumonia vaccine. States she took this for seven days but notes she felt strange on the medications and stopped. Her cough and nasal congestion improved with the medication. She then went to church the next day and has subsequently had worsening cough and fatigue. Notes she is coughing up large amounts of yellow sputum. She is coughing throughout exam. Denies fever, chills, or dyspnea or wheezing. Vitals are stable and saturating 100% on room air. Lungs are clear to ascultation bilaterally, no respiratory distress.  Daughter is very concerned due to her mother's continued symptoms and fatigue. Discussed that her course of doxycyline would likely have covered most bacterial infections. Discussed her symptoms will likely resolved with time and she likely has a post infectious cough however will start her on 5 day course of azithromycin given worsening symptoms. Encouraged patient to drink plenty of water and continue with decongetant and tylenol as needed for symptoms.

## 2021-03-20 ENCOUNTER — Telehealth: Payer: Self-pay | Admitting: *Deleted

## 2021-03-20 NOTE — Telephone Encounter (Signed)
Call from pt's daughter, Annamary Carolin, who stated pt is not doing any better. She was seen on Friday 4/29 by Dr Lisabeth Devoid. She was prescribed a Z-pak which pt finished yesterday. But pt is coughing up dark gray mucous; sometime thick which causes pt to gag and she does not want to eat. [name] stated she has been trying to get her mother to drink ensure.No available appts today;suggested UC - she stated pt had already been there; appt scheduled tomorrow morning @ 0945 AM with Dr Court Joy.

## 2021-03-20 NOTE — Telephone Encounter (Signed)
Agree with plan 

## 2021-03-21 ENCOUNTER — Other Ambulatory Visit: Payer: Self-pay | Admitting: *Deleted

## 2021-03-21 ENCOUNTER — Ambulatory Visit (HOSPITAL_COMMUNITY)
Admission: RE | Admit: 2021-03-21 | Discharge: 2021-03-21 | Disposition: A | Discharge status: Home / Self Care | Payer: Medicare Other | Source: Ambulatory Visit | Attending: Student in an Organized Health Care Education/Training Program | Admitting: Student in an Organized Health Care Education/Training Program

## 2021-03-21 ENCOUNTER — Ambulatory Visit (INDEPENDENT_AMBULATORY_CARE_PROVIDER_SITE_OTHER): Payer: Medicare Other | Admitting: Student

## 2021-03-21 ENCOUNTER — Encounter: Payer: Self-pay | Admitting: Student

## 2021-03-21 ENCOUNTER — Other Ambulatory Visit: Payer: Self-pay

## 2021-03-21 VITALS — BP 121/64 | HR 86 | Temp 98.5°F | Wt 126.9 lb

## 2021-03-21 DIAGNOSIS — R053 Chronic cough: Secondary | ICD-10-CM | POA: Diagnosis not present

## 2021-03-21 DIAGNOSIS — E1122 Type 2 diabetes mellitus with diabetic chronic kidney disease: Secondary | ICD-10-CM

## 2021-03-21 DIAGNOSIS — R5383 Other fatigue: Secondary | ICD-10-CM | POA: Diagnosis not present

## 2021-03-21 DIAGNOSIS — I25119 Atherosclerotic heart disease of native coronary artery with unspecified angina pectoris: Secondary | ICD-10-CM | POA: Diagnosis not present

## 2021-03-21 DIAGNOSIS — R059 Cough, unspecified: Secondary | ICD-10-CM | POA: Diagnosis not present

## 2021-03-21 DIAGNOSIS — R911 Solitary pulmonary nodule: Secondary | ICD-10-CM | POA: Diagnosis not present

## 2021-03-21 DIAGNOSIS — M353 Polymyalgia rheumatica: Secondary | ICD-10-CM

## 2021-03-21 LAB — CBC WITH DIFFERENTIAL/PLATELET
Abs Immature Granulocytes: 0.04 10*3/uL (ref 0.00–0.07)
Basophils Absolute: 0 10*3/uL (ref 0.0–0.1)
Basophils Relative: 0 %
Eosinophils Absolute: 0.3 10*3/uL (ref 0.0–0.5)
Eosinophils Relative: 3 %
HCT: 32.7 % — ABNORMAL LOW (ref 36.0–46.0)
Hemoglobin: 10.5 g/dL — ABNORMAL LOW (ref 12.0–15.0)
Immature Granulocytes: 0 %
Lymphocytes Relative: 19 %
Lymphs Abs: 2 10*3/uL (ref 0.7–4.0)
MCH: 28.5 pg (ref 26.0–34.0)
MCHC: 32.1 g/dL (ref 30.0–36.0)
MCV: 88.6 fL (ref 80.0–100.0)
Monocytes Absolute: 0.9 10*3/uL (ref 0.1–1.0)
Monocytes Relative: 9 %
Neutro Abs: 6.8 10*3/uL (ref 1.7–7.7)
Neutrophils Relative %: 69 %
Platelets: 343 10*3/uL (ref 150–400)
RBC: 3.69 MIL/uL — ABNORMAL LOW (ref 3.87–5.11)
RDW: 14.3 % (ref 11.5–15.5)
WBC: 10.1 10*3/uL (ref 4.0–10.5)
nRBC: 0 % (ref 0.0–0.2)

## 2021-03-21 LAB — COMPREHENSIVE METABOLIC PANEL
ALT: 30 U/L (ref 0–44)
AST: 32 U/L (ref 15–41)
Albumin: 3.2 g/dL — ABNORMAL LOW (ref 3.5–5.0)
Alkaline Phosphatase: 140 U/L — ABNORMAL HIGH (ref 38–126)
Anion gap: 8 (ref 5–15)
BUN: 8 mg/dL (ref 8–23)
CO2: 26 mmol/L (ref 22–32)
Calcium: 9.2 mg/dL (ref 8.9–10.3)
Chloride: 99 mmol/L (ref 98–111)
Creatinine, Ser: 0.93 mg/dL (ref 0.44–1.00)
GFR, Estimated: 60 mL/min — ABNORMAL LOW (ref >60)
Glucose, Bld: 133 mg/dL — ABNORMAL HIGH (ref 70–99)
Potassium: 3.7 mmol/L (ref 3.5–5.1)
Sodium: 133 mmol/L — ABNORMAL LOW (ref 135–145)
Total Bilirubin: 0.6 mg/dL (ref 0.3–1.2)
Total Protein: 7.1 g/dL (ref 6.5–8.1)

## 2021-03-21 LAB — URINALYSIS, ROUTINE W REFLEX MICROSCOPIC
Bilirubin Urine: NEGATIVE
Glucose, UA: NEGATIVE mg/dL
Hgb urine dipstick: NEGATIVE
Ketones, ur: NEGATIVE mg/dL
Leukocytes,Ua: NEGATIVE
Nitrite: NEGATIVE
Protein, ur: NEGATIVE mg/dL
Specific Gravity, Urine: 1.013 (ref 1.005–1.030)
pH: 5 (ref 5.0–8.0)

## 2021-03-21 LAB — SEDIMENTATION RATE: Sed Rate: 105 mm/hr — ABNORMAL HIGH (ref 0–22)

## 2021-03-21 LAB — C-REACTIVE PROTEIN: CRP: 29.7 mg/dL — ABNORMAL HIGH (ref <1.0)

## 2021-03-21 LAB — GLUCOSE, CAPILLARY: Glucose-Capillary: 119 mg/dL — ABNORMAL HIGH (ref 70–99)

## 2021-03-21 MED ORDER — AMOXICILLIN-POT CLAVULANATE 875-125 MG PO TABS: 1.0000 | ORAL_TABLET | Freq: Two times a day (BID) | ORAL | 0 refills | Status: AC

## 2021-03-21 NOTE — Progress Notes (Signed)
   CC: Cough  HPI:  Christine [name] is a 87 y.o. female with history as below significant for CKD 3, CHF, CAD, HTN, HLD, GERD, polymyalgia rheumatica, DM presenting for cough ongoing for about 6 weeks. Please refer to problem based charting for further details of assessment and plan of current problem and chronic medical conditions.  Past Medical History:  Diagnosis Date  . Anemia   . Anginal pain ([place])   . Arthritis    "back, arms, hips; hands" (11/30/2015)  . Benign hypertensive heart and kidney disease with diastolic CHF, NYHA class II and CKD stage III ([place])   . Blood transfusion 1972   "after daughter born, attempted to give me blood; couldn't give it cause my blood was cold" (09/23/2013)  . CAD (coronary artery disease)    a. LHC 08/23/11: dLM 40-50%, oRI 40%, oCFX 40%, oD1 70% (small and not amenable to PCI).  LM lesion did not appear to be flow limiting.  Medical rx was recommended;  b. Echo 08/23/11: mild LVH, EF 60-65%, grade 1 diast dysfxn, mild BAE, PASP 24;  c. 02/2012  Cath: LM 50d, LAD 50p, D1 70ost, RI 70p, RCA ok->Med Rx;  d. 01/2013 Cardiolite: EF 88, no ischemia/infarct.  . Carotid stenosis    a. dopplers 10/12:  0-39% bilat ICA;  b. 10/2012 U/S: 0-39% bilat, f/u 1 yr (10/2013).  . Chronic bronchitis   . Depression   . Diverticulosis of colon (without mention of hemorrhage)   . GERD (gastroesophageal reflux disease)   . Heart murmur, systolic    2-D echo in December 2011 showed a normal EF with grade 1 diastolic dysfunction, trivial pulmonary regurgitation and mildly elevated PA pressure at 37 mmHg probably secondary to her COPD.dynamic obstruction-mid cavity obliteration;  b. 02/2012 Echo: EF 60-65%, mild LVH, PASP 28mmHg.  Marland Kitchen History of stomach ulcers 1970's  . Hyperlipidemia   . Hypertension   . Hypothyroidism   . Migraines    a. Next atypical symptoms in the past. Patient was started on Neurontin for possible neuropathic origin of her pain.  . Polymyalgia rheumatica  ([place])   . Shortness of breath on exertion    "just related to angina >1 yr ago" (09/23/2013)  . Sinus arrhythmia   . Type II diabetes mellitus ([place])    a. On oral hypoglycemic agents.   Review of Systems:   Review of Systems  Constitutional: Positive for fever and malaise/fatigue.  HENT: Negative for congestion and sore throat.   Respiratory: Positive for cough, sputum production and shortness of breath. Negative for wheezing.   Gastrointestinal: Negative for nausea and vomiting.  Musculoskeletal: Negative for myalgias.  Neurological: Negative for loss of consciousness.  All other systems reviewed and are negative.    Physical Exam: Vitals:   03/21/21 1018 03/21/21 1037  BP: 121/64   Pulse: 86   Temp: 100.2 F (37.9 C) 98.5 F (36.9 C)  TempSrc: Oral Oral  SpO2: 99%   Weight: 126 lb 14.4 oz (57.6 kg)    Constitutional: no acute distress Head: atraumatic ENT: external ears normal, no lymphadenopathy Cardiovascular: regular rate and rhythm, normal heart sounds Pulmonary: effort normal, mild Rales in the left lower lung base, no wheezing Abdominal: flat, nontender, no rebound tenderness, bowel sounds normal Skin: warm and dry Neurological: alert, no focal deficit Psychiatric: normal mood and affect  Assessment & Plan:   See Encounters Tab for problem based charting.  Patient discussed with Dr. Evette Doffing

## 2021-03-21 NOTE — Addendum Note (Signed)
Addended by: Truddie Crumble on: 03/21/2021 11:20 AM   Modules accepted: Orders

## 2021-03-21 NOTE — Addendum Note (Signed)
Addended by: Andrew Au on: 03/21/2021 03:05 PM   Modules accepted: Orders

## 2021-03-21 NOTE — Patient Instructions (Signed)
Thank you for allowing [place] to be a part of your care today, it was a pleasure seeing you. We discussed your ongoing cough  I am checking these labs: CMP, CBC, ESR, CRP, urinalysis  I am ordering chest x-ray, please go upstairs and have this done after leaving the clinic.  I will call you with results of your x-ray and labs this afternoon and will prescribe medication based on that.  If you are getting worse, especially if you having a really hard time catching your breath or become confused, please come to the emergency room for further treatment.   Thank you, and please call the Internal Medicine Clinic at [phone] if you have any questions.  Best, Dr. Bridgett Larsson

## 2021-03-21 NOTE — Assessment & Plan Note (Addendum)
Patient presenting with cough now ongoing for close to 5 weeks.  She was seen 3 weeks ago at an urgent care and had negative COVID test, started on doxycycline which she took 7 days of.  She was seen in our clinic 4 days ago for continued cough and started on azithromycin.  Her cough is mildly improved since then and the sputum is now gray rather than green/yellow.  She looks fatigued on exam and has rhonchi in the left lung base.  She is still feeling very fatigued, she reports a fever of 102 last night.  No nausea, vomiting, diarrhea, confusion, syncope.  Given her longstanding cough status post treatment with 2 antibiotic regimens without resolution, we are widening the differential. Has a history of polymyalgia rheumatica but has not had steroids in some time, uses them only for flares.  History of GCA as well which was treated with steroids in 2019.  No personal history of cancer.  She has COPD.  She had a colonoscopy in 2019 without evidence of malignancy, mammogram in 2021 was negative.  She appears fairly ill and with the fever yesterday, I discussed with the patient and her son if she would be amenable to admission for further management.  They declined and would like to continue treating at home for now.  Given return precautions, they will return to the clinic or go to ED for further care if symptoms worsen.  - Check CMP, CBC, ESR, CRP - Check UA - Check chest x-ray, consider CT if this is unrevealing  Addendum: Lab work with no leukocytosis, elevated ESR and CRP above her baseline.  Patient was unable to void to provide sample for urinalysis.  Chest x-ray without obvious infiltrate.  As she appeared quite ill on exam, will broaden her antibiotic coverage for potential infection.  Also concern for malignancy or other cause of her chronic cough, will order further imaging.  - Start Augmentin.  Discussed risk for C. difficile with patient's son, they understand.  Given return precautions -  Ordered CT scan of chest

## 2021-03-22 ENCOUNTER — Ambulatory Visit (HOSPITAL_BASED_OUTPATIENT_CLINIC_OR_DEPARTMENT_OTHER)
Admission: RE | Admit: 2021-03-22 | Discharge: 2021-03-22 | Disposition: A | Discharge status: Home / Self Care | Payer: Medicare Other | Source: Ambulatory Visit | Attending: Student in an Organized Health Care Education/Training Program | Admitting: Student in an Organized Health Care Education/Training Program

## 2021-03-22 ENCOUNTER — Other Ambulatory Visit: Payer: Self-pay | Admitting: Student

## 2021-03-22 ENCOUNTER — Encounter (HOSPITAL_BASED_OUTPATIENT_CLINIC_OR_DEPARTMENT_OTHER): Payer: Self-pay

## 2021-03-22 DIAGNOSIS — R911 Solitary pulmonary nodule: Secondary | ICD-10-CM | POA: Diagnosis not present

## 2021-03-22 DIAGNOSIS — R059 Cough, unspecified: Secondary | ICD-10-CM | POA: Diagnosis not present

## 2021-03-22 DIAGNOSIS — M316 Other giant cell arteritis: Secondary | ICD-10-CM

## 2021-03-22 DIAGNOSIS — R053 Chronic cough: Secondary | ICD-10-CM | POA: Diagnosis not present

## 2021-03-22 DIAGNOSIS — R509 Fever, unspecified: Secondary | ICD-10-CM

## 2021-03-22 DIAGNOSIS — J439 Emphysema, unspecified: Secondary | ICD-10-CM | POA: Diagnosis not present

## 2021-03-22 DIAGNOSIS — Z114 Encounter for screening for human immunodeficiency virus [HIV]: Secondary | ICD-10-CM

## 2021-03-22 MED ORDER — IOHEXOL 300 MG/ML  SOLN
75.0000 mL | Freq: Once | INTRAMUSCULAR | Status: AC | PRN
  Administered 2021-03-22: 75 mL via INTRAVENOUS

## 2021-03-22 MED ORDER — PREDNISONE 20 MG PO TABS: 60.0000 mg | ORAL_TABLET | Freq: Every day | ORAL | 0 refills | Status: DC

## 2021-03-22 NOTE — Progress Notes (Signed)
Internal Medicine Clinic Attending  Case discussed with Dr. Bridgett Larsson  At the time of the visit.  We reviewed the resident's history and exam and pertinent patient test results.  I agree with the assessment, diagnosis, and plan of care documented in the resident's note.   Elderly patient with history of GCA not on treatment, now with over 3 weeks of fevers, fatigue, myalgias, and cough that has not improved with antibiotic courses so far. Her chest xray is without infiltrates. She has structural lung disease, so is at risk for pneumonia, but I would expect that to be resolved after this much time and with antibiotics. Her ESR is very elevated over 100, which raises possibility this is non-infectious, and rather is related to vasculitis. Could be recurring GCA, perhaps Adult-onset Stills Disease. At this point, I think we should restart prednisone and monitor her response closely. Malignancy also a possibility, she is at risk for lung cancer, I agree with CT imaging to rule that out.

## 2021-03-22 NOTE — Assessment & Plan Note (Signed)
Called patient and her son, Jenny Reichmann, to discuss her lab results which are significant for elevated ESR and CRP but normal leukocytosis.  Chest x-ray with no sign of infiltrate.  We obtained a CT chest which also did not show any infiltrate, there are pulmonary nodules which are stable from prior.  Overall reassuring CT chest with no signs of active malignancy.  At this point, we are leaning toward an inflammatory/rheumatic etiology rather than infectious as she is status post 2 courses of antibiotics, doxycycline and azithromycin.  With her history of chest arteritis, suspect she has high risk of rheumatic or vasculitic etiology.  - Start prednisone 60 mg daily, continue this for at least 2 weeks before tapering dose - Further rheumatic work-up ordered, patient is able to come on Monday to collect these labs

## 2021-03-22 NOTE — Assessment & Plan Note (Signed)
Stable on CT chest from 03/21/21, no need for further follow up per radiology.

## 2021-03-22 NOTE — Progress Notes (Signed)
See problem based charting for documentation.

## 2021-03-23 ENCOUNTER — Encounter: Payer: Self-pay | Admitting: *Deleted

## 2021-03-23 NOTE — Progress Notes (Signed)
Internal Medicine Clinic Attending ? ?Case discussed with Dr. Liang  At the time of the visit.  We reviewed the resident?s history and exam and pertinent patient test results.  I agree with the assessment, diagnosis, and plan of care documented in the resident?s note. ? ?

## 2021-03-23 NOTE — Progress Notes (Signed)

## 2021-03-26 ENCOUNTER — Other Ambulatory Visit (INDEPENDENT_AMBULATORY_CARE_PROVIDER_SITE_OTHER): Payer: Medicare Other

## 2021-03-26 DIAGNOSIS — M316 Other giant cell arteritis: Secondary | ICD-10-CM | POA: Diagnosis not present

## 2021-03-26 DIAGNOSIS — R509 Fever, unspecified: Secondary | ICD-10-CM | POA: Diagnosis not present

## 2021-03-26 NOTE — Addendum Note (Signed)
Addended by: Andrew Au on: 03/26/2021 10:01 AM   Modules accepted: Orders

## 2021-03-27 LAB — URINALYSIS, COMPLETE
Bilirubin, UA: NEGATIVE
Glucose, UA: NEGATIVE
Ketones, UA: NEGATIVE
Leukocytes,UA: NEGATIVE
Nitrite, UA: NEGATIVE
Protein,UA: NEGATIVE
RBC, UA: NEGATIVE
Specific Gravity, UA: 1.014 (ref 1.005–1.030)
Urobilinogen, Ur: 0.2 mg/dL (ref 0.2–1.0)
pH, UA: 6 (ref 5.0–7.5)

## 2021-03-27 LAB — MICROSCOPIC EXAMINATION
Bacteria, UA: NONE SEEN
Casts: NONE SEEN /lpf
Epithelial Cells (non renal): NONE SEEN /hpf (ref 0–10)
RBC, Urine: NONE SEEN /hpf (ref 0–2)
WBC, UA: NONE SEEN /hpf (ref 0–5)

## 2021-03-29 LAB — RNP ANTIBODIES: ENA RNP Ab: <0.2 AI (ref 0.0–0.9)

## 2021-03-29 LAB — SJOGREN'S SYNDROME ANTIBODS(SSA + SSB)
ENA SSA (RO) Ab: <0.2 AI (ref 0.0–0.9)
ENA SSB (LA) Ab: <0.2 AI (ref 0.0–0.9)

## 2021-03-29 LAB — HEPATITIS B SURFACE ANTIBODY,QUALITATIVE: Hep B Surface Ab, Qual: NONREACTIVE

## 2021-03-29 LAB — MPO/PR-3 (ANCA) ANTIBODIES
ANCA Proteinase 3: <3.5 U/mL (ref 0.0–3.5)
Myeloperoxidase Ab: <9 U/mL (ref 0.0–9.0)

## 2021-03-29 LAB — ANTI-DNA ANTIBODY, DOUBLE-STRANDED: dsDNA Ab: <1 IU/mL (ref 0–9)

## 2021-03-29 LAB — HEPATITIS B CORE ANTIBODY, TOTAL: Hep B Core Total Ab: NEGATIVE

## 2021-03-29 LAB — CENTROMERE ANTIBODIES: Centromere Ab Screen: <0.2 AI (ref 0.0–0.9)

## 2021-03-29 LAB — CK: Total CK: 134 U/L (ref 26–161)

## 2021-03-29 LAB — HEPATITIS B SURFACE ANTIGEN: Hepatitis B Surface Ag: NEGATIVE

## 2021-03-29 LAB — ANTI-SCLERODERMA ANTIBODY: Scleroderma (Scl-70) (ENA) Antibody, IgG: <0.2 AI (ref 0.0–0.9)

## 2021-03-29 LAB — ANTI-SMITH ANTIBODY: ENA SM Ab Ser-aCnc: <0.2 AI (ref 0.0–0.9)

## 2021-03-29 LAB — ANTINUCLEAR ANTIBODIES, IFA: ANA Titer 1: NEGATIVE

## 2021-04-06 NOTE — Progress Notes (Signed)
CC: Cough, fever, fatigue  HPI:  [name] is a 87 y.o. female with history as below presenting for follow-up on the above, likely rheumatologic illness. Please refer to problem based charting for further details of assessment and plan of current problem and chronic medical conditions.   Past Medical History:  Diagnosis Date  . Anemia   . Anginal pain ([place])   . Arthritis    "back, arms, hips; hands" (11/30/2015)  . Benign hypertensive heart and kidney disease with diastolic CHF, NYHA class II and CKD stage III ([place])   . Blood transfusion 1972   "after daughter born, attempted to give me blood; couldn't give it cause my blood was cold" (09/23/2013)  . CAD (coronary artery disease)    a. LHC 08/23/11: dLM 40-50%, oRI 40%, oCFX 40%, oD1 70% (small and not amenable to PCI).  LM lesion did not appear to be flow limiting.  Medical rx was recommended;  b. Echo 08/23/11: mild LVH, EF 60-65%, grade 1 diast dysfxn, mild BAE, PASP 24;  c. 02/2012  Cath: LM 50d, LAD 50p, D1 70ost, RI 70p, RCA ok->Med Rx;  d. 01/2013 Cardiolite: EF 88, no ischemia/infarct.  . Carotid stenosis    a. dopplers 10/12:  0-39% bilat ICA;  b. 10/2012 U/S: 0-39% bilat, f/u 1 yr (10/2013).  . Chronic bronchitis   . Depression   . Diverticulosis of colon (without mention of hemorrhage)   . GERD (gastroesophageal reflux disease)   . Heart murmur, systolic    2-D echo in December 2011 showed a normal EF with grade 1 diastolic dysfunction, trivial pulmonary regurgitation and mildly elevated PA pressure at 37 mmHg probably secondary to her COPD.dynamic obstruction-mid cavity obliteration;  b. 02/2012 Echo: EF 60-65%, mild LVH, PASP 10mmHg.  Marland Kitchen History of stomach ulcers 1970's  . Hyperlipidemia   . Hypertension   . Hypothyroidism   . Migraines    a. Next atypical symptoms in the past. Patient was started on Neurontin for possible neuropathic origin of her pain.  . Polymyalgia rheumatica ([place])   . Shortness of breath on  exertion    "just related to angina >1 yr ago" (09/23/2013)  . Sinus arrhythmia   . Solitary pulmonary nodule 12/15/2006   CT Chest in January 2008  IMPRESSION:  1. Stable CT of the chest with mild biapical scarring and scattered nodularity, most likely postinflammatory in the absence of a history of malignancy.  2. No acute chest findings are demonstrated.  3. Unless the patient has a history of malignancy or risk factors for lung cancer, the chest findings do not necessarily require any specific followup. If follow   . Type II diabetes mellitus ([place])    a. On oral hypoglycemic agents.   Review of Systems:   Review of Systems  Constitutional: Negative for chills, fever, malaise/fatigue and weight loss.  Respiratory: Positive for cough (Resolving). Negative for shortness of breath.   Musculoskeletal: Negative for joint pain and myalgias.  All other systems reviewed and are negative.    Physical Exam: Vitals:   04/09/21 0920  BP: (!) 129/53  Pulse: 83  Temp: 98.4 F (36.9 C)  TempSrc: Oral  SpO2: 100%   Constitutional: no acute distress Head: atraumatic ENT: external ears normal Cardiovascular: regular rate and rhythm, normal heart sounds Pulmonary: effort normal, normal breath sounds bilaterally Abdominal: flat Skin: warm and dry Neurological: alert, no focal deficit Psychiatric: normal mood and affect  Assessment & Plan:   See Encounters Tab for  problem based charting.  Patient discussed with Dr. Philipp Ovens

## 2021-04-09 ENCOUNTER — Encounter: Payer: Self-pay | Admitting: Student

## 2021-04-09 ENCOUNTER — Ambulatory Visit (INDEPENDENT_AMBULATORY_CARE_PROVIDER_SITE_OTHER): Payer: Medicare Other | Admitting: Student

## 2021-04-09 DIAGNOSIS — M790 Rheumatism, unspecified: Secondary | ICD-10-CM | POA: Diagnosis not present

## 2021-04-09 DIAGNOSIS — I25119 Atherosclerotic heart disease of native coronary artery with unspecified angina pectoris: Secondary | ICD-10-CM

## 2021-04-09 DIAGNOSIS — R911 Solitary pulmonary nodule: Secondary | ICD-10-CM

## 2021-04-09 NOTE — Addendum Note (Signed)
Addended by: Andrew Au on: 04/09/2021 09:54 AM   Modules accepted: Orders

## 2021-04-09 NOTE — Progress Notes (Signed)
Internal Medicine Clinic Attending  Case discussed with Dr. Chen  At the time of the visit.  We reviewed the resident's history and exam and pertinent patient test results.  I agree with the assessment, diagnosis, and plan of care documented in the resident's note. 

## 2021-04-09 NOTE — Patient Instructions (Signed)
Thank you for allowing [place] to be a part of your care today, it was a pleasure seeing you. We discussed your rheumatologic illness from which you are doing much better.   I am glad that you are feeling much better. In the future, please call our office to discuss stopping steroids.  Please follow up in 1 month for your regular follow up   Thank you, and please call the Internal Medicine Clinic at [phone] if you have any questions.  Best, Dr. Bridgett Larsson

## 2021-04-09 NOTE — Assessment & Plan Note (Signed)
Stable over 3.5 years, no follow up necessary

## 2021-04-09 NOTE — Assessment & Plan Note (Addendum)
Patient was evaluated 2 weeks ago for cough and fevers ongoing for 5 weeks. No joint pain. Had failed doxycycline and azithromycin outpatient. CXR and CT chest without acute findings, did note pulmonary nodule stable from 3.5 years ago. Noted history of polymyalgia rheumatica and GCA, last course of steroids in 2019 and improved with this. No personal hx of cancer. Workup significant for CRP 29.7, ESR 105. Started on Augmentin and prednisone 76m daily. Benign UA, Hepatitis B and C testing, CK, ANCA, anti-scleroderma Ab, Sjogren's Ab, centromere Ab, anti-smith, ANA, anti-DNA, RNP.  Does not meet YKathrine Haddockcriteria for diagnosis of adult onset stills disease.  Of note the rheumatologic workup was collected after steroids were started.  Today, she feels much improved.  Her fatigue, loss of appetite, and fevers have resolved.  Her cough is resolving as well.  On exam, she appears well.  Normal lung sounds.  She never had any joint pain. She started her prescribed steroids and then stopped them after 5 days because her daughter thought it was too high dose.  She has not been off steroids for about 1.5 weeks.  She denies having any rebound symptoms, and there are currently no signs of adrenal insufficiency.  Noted to have a wide pulse pressure which is baseline for her.  Given rapid improvement with steroids and severe elevations in ESR and CRP, suspect this was some type of rheumatic disease, though testing as above was not diagnostic.   - Discontinue steroids - Discussed with patient to coordinate with u[place]for steroid taper in the future

## 2021-04-30 ENCOUNTER — Other Ambulatory Visit: Payer: Self-pay | Admitting: Gastroenterology

## 2021-04-30 DIAGNOSIS — R748 Abnormal levels of other serum enzymes: Secondary | ICD-10-CM

## 2021-04-30 DIAGNOSIS — R7989 Other specified abnormal findings of blood chemistry: Secondary | ICD-10-CM

## 2021-04-30 NOTE — Progress Notes (Signed)
Cardiology Office Note   Date:  05/01/2021   ID:  Christine [name], DOB [birth year], MRN [id number]  PCP:  [name], MD  Cardiologist:   Skeet Latch, MD   Chief Complaint  Patient presents with   Follow-up      History of Present Illness: Christine [name] is an 87 y.o. female with CAD, carotid stenosis, hypertension, hyperlipidemia, hypothyroidism, temporal arteritis, and diabetes who presents for follow up.  Christine [name] was previously a patient of Dr. Aundra Dubin.  She initially established care 08/2011 when she presented with chest pain. She had a left heart catheterization that revealed 40-50% left main stenosis and a 75% diagonal lesion that were medically managed.  She had a repeat cath 09/2015 that was unchanged.  CPX 10/2015 revealed mild obstructive pulmonary disease and severe chronotropic incompetence.  Metoprolol was decreased.  There was also concern for possible exercise-induced pulmonary hypertension or functional MR.  Christine [name] had a 48 hour Holter 03/2016 that showed occasional PACs and PVCs.  In 2017 she had orthostatic hypotension.  She was started on compression stockings and pyridostigmine. She did not tolerate this due to dizziness.  She saw Rosaria Ferries, PA-C, on 07/2017 and reported exertional dyspnea.  She also noted mild chest pain.  She was referred for heart catheterization that revealed no significant changes from prior.  She had 40% left main, 70% ostial D1, and 10% proximal RCA disease.  LVEF was 55-65%.  LVEDP was normal.  Echo 11/07/17 revealed LVEF 65-70% and grade 1 diastolic dysfunction.  PASP was 40 mmHg.  She had aortic valve sclerosis without stenosis.  Christine [name] followed up with Dr. Vaughan Browner on 09/15/17 and continued to report shortness of breath and fatigue.  She was referred for a CPX 09/26/17 that again revealed chronotropic incompetence.  Her heart rate increased from 61 bpm at rest to 87 at max.  Between seeing her pulmonologist and actually having the  CPX her metoprolol was increased to 25 mg daily.  She continued to have significant fatigue with exertion. Metoprolol was discontinued.  Christine [name] was diagnosed with temporal arteritis and her fatigue also improved after treatment.  However she continued to have some dizziness.  Christine [name] has struggled with fecal incontinence.  She was evaluated by GI and had a colonoscopy that revealed rectal prolapse.  She continues to do pelvic floor PT.  She has been struggling with pain in her thighs.  Her ESR has been elevated and she was diagnosed with PMR.  Irbesartan was reduced due to dizziness.  She is accompanied by her son. Today, she is feeling good overall. She notes recovering from her sinus infection at this time but continues to struggle with discomfort attributable to her rectal prolapse. For exercise, her walking is significantly limited by bilateral thigh pain. Also she has therapeutic exercises she does daily, but this also causes her thigh pain. She has a recumbent bike she will try to use. Sometimes she will feel mildly lightheaded, but this does not occur often. At home, her blood pressure typically ranges from the 120s-140s/50s-60s. She denies any chest pain, shortness of breath, or palpitations. No headaches, or syncope to report. Also has no lower extremity edema, orthopnea or PND.   Past Medical History:  Diagnosis Date   Anemia    Anginal pain ([place])    Arthritis    "back, arms, hips; hands" (11/30/2015)   Benign hypertensive heart and kidney disease with diastolic CHF, NYHA class II and CKD  stage III ([place])    Blood transfusion 1972   "after daughter born, attempted to give me blood; couldn't give it cause my blood was cold" (09/23/2013)   CAD (coronary artery disease)    a. LHC 08/23/11: dLM 40-50%, oRI 40%, oCFX 40%, oD1 70% (small and not amenable to PCI).  LM lesion did not appear to be flow limiting.  Medical rx was recommended;  b. Echo 08/23/11: mild LVH, EF 60-65%, grade 1 diast  dysfxn, mild BAE, PASP 24;  c. 02/2012  Cath: LM 50d, LAD 50p, D1 70ost, RI 70p, RCA ok->Med Rx;  d. 01/2013 Cardiolite: EF 88, no ischemia/infarct.   Carotid stenosis    a. dopplers 10/12:  0-39% bilat ICA;  b. 10/2012 U/S: 0-39% bilat, f/u 1 yr (10/2013).   Chronic bronchitis    Chronotropic incompetence 05/01/2021   Depression    Diverticulosis of colon (without mention of hemorrhage)    GERD (gastroesophageal reflux disease)    Heart murmur, systolic    2-D echo in December 2011 showed a normal EF with grade 1 diastolic dysfunction, trivial pulmonary regurgitation and mildly elevated PA pressure at 37 mmHg probably secondary to her COPD.dynamic obstruction-mid cavity obliteration;  b. 02/2012 Echo: EF 60-65%, mild LVH, PASP 53mHg.   History of stomach ulcers 1970's   Hyperlipidemia    Hypertension    Hypothyroidism    Migraines    a. Next atypical symptoms in the past. Patient was started on Neurontin for possible neuropathic origin of her pain.   Polymyalgia rheumatica ([facility])    Premature atrial contraction 05/01/2021   She remains asymptomatic.  No medication indicated.   Shortness of breath on exertion    "just related to angina >1 yr ago" (09/23/2013)   Sinus arrhythmia    Solitary pulmonary nodule 12/15/2006   CT Chest in January 2008  IMPRESSION:  1. Stable CT of the chest with mild biapical scarring and scattered nodularity, most likely postinflammatory in the absence of a history of malignancy.  2. No acute chest findings are demonstrated.  3. Unless the patient has a history of malignancy or risk factors for lung cancer, the chest findings do not necessarily require any specific followup. If follow    Type II diabetes mellitus ([place]    a. On oral hypoglycemic agents.    Past Surgical History:  Procedure Laterality Date   CARDIAC CATHETERIZATION N/A 09/26/2015   Procedure: Right/Left Heart Cath and Coronary Angiography;  Surgeon: DLarey Dresser MD;  [facility];   Service: Cardiovascular;  Laterality: N/A;   CATARACT EXTRACTION W/ INTRAOCULAR LENS  IMPLANT, BILATERAL Bilateral 1990's   LEFT HEART CATHETERIZATION WITH CORONARY ANGIOGRAM N/A 03/16/2012   Procedure: LEFT HEART CATHETERIZATION WITH CORONARY ANGIOGRAM;  Surgeon: THillary Bow MD;  [facility];  Service: Cardiovascular;  Laterality: N/A;   RIGHT/LEFT HEART CATH AND CORONARY ANGIOGRAPHY N/A 08/12/2017   Procedure: RIGHT/LEFT HEART CATH AND CORONARY ANGIOGRAPHY;  Surgeon: JMartinique Peter M, MD;  [facility];  Service: Cardiovascular;  Laterality: N/A;   VAGINAL HYSTERECTOMY  1976     Current Outpatient Medications  Medication Sig Dispense Refill   acetaminophen (TYLENOL) 500 MG tablet Take 1,000 mg by mouth every 6 (six) hours as needed for headache. For pain     acetaminophen-codeine (TYLENOL #3) 300-30 MG tablet SMARTSIG:1 Tablet(s) By Mouth Every 12 Hours PRN     amLODipine (NORVASC) 10 MG tablet TAKE 1 TABLET(10 MG) BY MOUTH DAILY 90  tablet 3   Ascorbic Acid (VITAMIN C PO) Take 2 tablets by mouth daily.      atorvastatin (LIPITOR) 40 MG tablet TAKE 0.5 TABLETS BY MOUTH EVERY EVENING 45 tablet 3   clopidogrel (PLAVIX) 75 MG tablet TAKE 1 TABLET BY MOUTH EVERY EVENING 90 tablet 3   famotidine (PEPCID) 20 MG tablet Take 1 tablet (20 mg total) by mouth daily as needed for heartburn or indigestion. 90 tablet 3   fluticasone (FLONASE) 50 MCG/ACT nasal spray PLACE 1 SPRAY IN EACH NOSTRIL EVERY DAY 16 g 2   Fluticasone-Salmeterol (ADVAIR) 250-50 MCG/DOSE AEPB INHALE 1 PUFF BY MOUTH EVERY 12 HOURS 60 each 3   glimepiride (AMARYL) 1 MG tablet take 1 tablet by mouth once daily BEFORE BREAKFAST. 90 tablet 3   glucose blood (ONETOUCH ULTRA) test strip Use once daily to check blood sugar before breakfast. 100 each 3   imipramine (TOFRANIL) 50 MG tablet Take 1 tablet (50 mg total) by mouth 2 (two) times daily. 180 tablet 3   Lancets (ONETOUCH DELICA PLUS [id number]) MISC Inject  1 Units into the skin daily. 100 each 11   levothyroxine (SYNTHROID) 25 MCG tablet TAKE 1 TABLET BY MOUTH EVERY DAY 90 tablet 1   Multiple Vitamin (MULTIVITAMIN WITH MINERALS) TABS tablet Take 1 tablet by mouth daily. (Patient taking differently: Take 2 tablets by mouth daily.) 30 tablet 2   ondansetron (ZOFRAN) 4 MG tablet Take 1 tablet (4 mg total) by mouth every 8 (eight) hours as needed for nausea or vomiting. 20 tablet 0   valsartan (DIOVAN) 320 MG tablet TAKE 1 TABLET(320 MG) BY MOUTH DAILY 90 tablet 3   No current facility-administered medications for this visit.    Allergies:   Aspirin, Atarax [hydroxyzine], Nsaids, Codeine, Haldol [haloperidol], Other, Penicillins, and Metoprolol    Social History:  The patient  reports that she has never smoked. She has never used smokeless tobacco. She reports that she does not drink alcohol and does not use drugs.   Family History:  The patient's family history includes COPD in her father; Colon cancer in her maternal grandmother; Heart attack in her maternal grandmother; Other in her mother; Pulmonary Hypertension in her daughter.    ROS:   Please see the history of present illness. (+) Bilateral LE pain, thigh (+) Lightheadedness All other systems are reviewed and negative.    PHYSICAL EXAM: VS:  BP (!) 124/50 (BP Location: Left Arm, Patient Position: Sitting, Cuff Size: Normal)   Pulse 94   Ht _0  (1.676 m)   Wt 125 lb (56.7 kg)   LMP 02/11/1975 (Approximate)   BMI 20.18 kg/m  , BMI Body mass index is 20.18 kg/m. GENERAL:  Well appearing HEENT: Pupils equal round and reactive, fundi not visualized, oral mucosa unremarkable NECK:  No jugular venous distention, waveform within normal limits, carotid upstroke brisk and symmetric, no bruits LUNGS:  Clear to auscultation bilaterally HEART:  RRR.  PMI not displaced or sustained,S1 and S2 within normal limits, no S3, no S4, no clicks, no rubs, no murmurs ABD:  Flat, positive bowel  sounds normal in frequency in pitch, no bruits, no rebound, no guarding, no midline pulsatile mass, no hepatomegaly, no splenomegaly EXT:  2 plus pulses throughout, no edema, no cyanosis no clubbing SKIN:  No rashes no nodules NEURO:  Cranial nerves II through XII grossly intact, motor grossly intact throughout PSYCH:  Cognitively intact, oriented to person place and time   EKG:   05/01/2021: Sinus rhythm.  Rate 94 bpm. Frequent PAC's. Prior anteroseptal infarct 07/30/2018: Sinus rhythm.  Cannot rule out prior septal infarct. 10/22/17: Sinus rhythm.  Rate 72 bpm.  Prior septal infarct.  LAD.   48 hour Holter 04/12/16: Occasional PACs and PVCs, no worrisome arrhythmias.  CPX 09/26/17: Exercise testing with gas exchange demonstrates moderately reduced functional impairment when compared to matched sedentary norms. There is mild indication for circulatory limitation given elevated VE/VCO2 slope. There was significant chronotropic incompetence. Despite patient's symptoms, results have mildly increased and improved with similar limitations as CPX in 2016.  Heart rate increased from 61 bpm at baseline to 87 bpm at peak stress.  RHC/LHC 08/12/17: Ost LM lesion, 40 %stenosed. LM lesion, 40 %stenosed. Ost 1st Diag to 1st Diag lesion, 70 %stenosed. Prox RCA to Mid RCA lesion, 10 %stenosed. The left ventricular systolic function is normal. LV end diastolic pressure is normal. The left ventricular ejection fraction is 55-65% by visual estimate. LV end diastolic pressure is normal.   1. Nonobstructive CAD. Unchanged from 2013 and 2016.  2. Normal LV function 3. Normal LV filling pressures 4. Normal right heart pressures  Echo 09/19/15:  Study Conclusions   - Left ventricle: The cavity size was normal. Wall thickness was   increased in a pattern of moderate LVH. Systolic function was   normal. The estimated ejection fraction was in the range of 60%   to 65%. Wall motion was normal; there were no  regional wall   motion abnormalities. Left ventricular diastolic function   parameters were normal. - Atrial septum: No defect or patent foramen ovale was identified.  48 Hour Holter Monitor 12/01/17:   Quality: Fair.  Baseline artifact. Predominant rhythm: sinus rhythm Average heart rate: 83 bpm Max heart rate: 68 bpm Min heart rate: 109 bpm   PACs and PVCs noted <1% PVCs Atrial bigeminy  Recent Labs: 06/23/2020: TSH 2.380 03/21/2021: ALT 30; BUN 8; Creatinine, Ser 0.93; Hemoglobin 10.5; Platelets 343; Potassium 3.7; Sodium 133   03/07/2018: Potassium 4.7, creatinine 1.19 Hemoglobin 12  Lipid Panel    Component Value Date/Time   CHOL 143 02/02/2021 1136   TRIG 131 02/02/2021 1136   HDL 43 02/02/2021 1136   CHOLHDL 3.3 02/02/2021 1136   CHOLHDL 4 08/05/2014 0933   VLDL 33.8 08/05/2014 0933   LDLCALC 77 02/02/2021 1136      Wt Readings from Last 3 Encounters:  05/01/21 125 lb (56.7 kg)  04/09/21 126 lb 4.8 oz (57.3 kg)  03/21/21 126 lb 14.4 oz (57.6 kg)      ASSESSMENT AND PLAN: Essential hypertension Blood pressures well have been controlled on amlodipine and losartan.  Coronary artery disease involving native coronary artery of native heart with angina pectoris [facility]) She has non-obstructive CAD.  Continue current management with clopidogrel and atorvastatin.  Encouraged her to increase her exercise as tolerated.  Hyperlipidemia Continue atorvastatin  Chronic diastolic (congestive) heart failure ([place]) She is euvolemic and doing well.  Continue with blood pressure control.  Chronotropic incompetence Symptoms improved since stopping metoprolol.   Current medicines are reviewed at length with the patient today.  The patient does not have concerns regarding medicines.  The following changes have been made:   Labs/ tests ordered today include:   No orders of the defined types were placed in this encounter.   Disposition:   FU with Tonnia Bardin C. Oval Linsey, MD,  [facility] in 1 year.   I,Mathew Stumpf,acting as a Education administrator for Skeet Latch, MD.,have documented all relevant documentation on the  behalf of Skeet Latch, MD,as directed by  Skeet Latch, MD while in the presence of Skeet Latch, MD.  I, [place] Oval Linsey, MD have reviewed all documentation for this visit.  The documentation of the exam, diagnosis, procedures, and orders on 05/01/2021 are all accurate and complete.   Signed, Eustacio Ellen C. Oval Linsey, MD, [facility]  05/01/2021 1:02 PM    [facility]

## 2021-05-01 ENCOUNTER — Ambulatory Visit (INDEPENDENT_AMBULATORY_CARE_PROVIDER_SITE_OTHER): Payer: Medicare Other | Admitting: Cardiovascular Disease

## 2021-05-01 ENCOUNTER — Other Ambulatory Visit: Payer: Self-pay

## 2021-05-01 ENCOUNTER — Encounter: Payer: Self-pay | Admitting: Cardiovascular Disease

## 2021-05-01 DIAGNOSIS — I25119 Atherosclerotic heart disease of native coronary artery with unspecified angina pectoris: Secondary | ICD-10-CM

## 2021-05-01 DIAGNOSIS — I10 Essential (primary) hypertension: Secondary | ICD-10-CM

## 2021-05-01 DIAGNOSIS — I5032 Chronic diastolic (congestive) heart failure: Secondary | ICD-10-CM | POA: Diagnosis not present

## 2021-05-01 DIAGNOSIS — E785 Hyperlipidemia, unspecified: Secondary | ICD-10-CM

## 2021-05-01 DIAGNOSIS — I491 Atrial premature depolarization: Secondary | ICD-10-CM | POA: Diagnosis not present

## 2021-05-01 DIAGNOSIS — I4589 Other specified conduction disorders: Secondary | ICD-10-CM | POA: Insufficient documentation

## 2021-05-01 HISTORY — DX: Other specified conduction disorders: I45.89

## 2021-05-01 HISTORY — DX: Atrial premature depolarization: I49.1

## 2021-05-01 NOTE — Patient Instructions (Signed)
Medication Instructions:  Your physician recommends that you continue on your current medications as directed. Please refer to the Current Medication list given to you today.   *If you need a refill on your cardiac medications before your next appointment, please call your pharmacy*  Lab Work: NONE  Testing/Procedures: NONE  Follow-Up: At [facility], you and your health needs are our priority.  As part of our continuing mission to provide you with exceptional heart care, we have created designated Provider Care Teams.  These Care Teams include your primary Cardiologist (physician) and Advanced Practice Providers (APPs -  Physician Assistants and Nurse Practitioners) who all work together to provide you with the care you need, when you need it.  We recommend signing up for the patient portal called "MyChart".  Sign up information is provided on this After Visit Summary.  MyChart is used to connect with patients for Virtual Visits (Telemedicine).  Patients are able to view lab/test results, encounter notes, upcoming appointments, etc.  Non-urgent messages can be sent to your provider as well.   To learn more about what you can do with MyChart, go to [email or web address].    Your next appointment:   12 MONTHS   The format for your next appointment:   In Person  Provider:   DR [place] NP AT [place]

## 2021-05-01 NOTE — Assessment & Plan Note (Signed)
Continue atorvastatin

## 2021-05-01 NOTE — Assessment & Plan Note (Addendum)
She has non-obstructive CAD.  Continue current management with clopidogrel and atorvastatin.  Encouraged her to increase her exercise as tolerated.

## 2021-05-01 NOTE — Assessment & Plan Note (Signed)
She is euvolemic and doing well.  Continue with blood pressure control.

## 2021-05-01 NOTE — Assessment & Plan Note (Signed)
Blood pressures well have been controlled on amlodipine and losartan.

## 2021-05-01 NOTE — Assessment & Plan Note (Addendum)
Symptoms improved since stopping metoprolol.

## 2021-05-15 NOTE — Progress Notes (Signed)
Things That May Be Affecting Your Health:  Alcohol  Hearing loss  Pain    Depression  Home Safety  Sexual Health   Diabetes  Lack of physical activity  Stress   Difficulty with daily activities  Loneliness  Tiredness   Drug use X Medicines  Tobacco use  X Falls  Motor Vehicle Safety  Weight   Food choices  Oral Health  Other    YOUR PERSONALIZED HEALTH PLAN : 1. Schedule your next subsequent Medicare Wellness visit in one year 2. Attend all of your regular appointments to address your medical issues 3. Complete the preventative screenings and services   Annual Wellness Visit   Medicare Covered Preventative Screenings and [place] Men and Women Who How Often Need? Date of Last Service Action  Abdominal Aortic Aneurysm Adults with AAA risk factors Once      Alcohol Misuse and Counseling All Adults Screening once a year if no alcohol misuse. Counseling up to 4 face to face sessions.     Bone Density Measurement  Adults at risk for osteoporosis Once every 2 yrs Yes 2017 Has been on long course of steroids, needs updating.   Lipid Panel Z13.6 All adults without CV disease Once every 5 yrs No 2022    Colorectal Cancer  Stool sample or Colonoscopy All adults 51 and older  Once every year Every 10 years No 2019  No further screenings per Dr. Havery Moros  Depression All Adults Once a year  Today   Diabetes Screening Blood glucose, post glucose load, or GTT Z13.1 All adults at risk Pre-diabetics Once per year Twice per year No Diabetic    Diabetes  Self-Management Training All adults Diabetics 10 hrs first year; 2 hours subsequent years. Requires Copay     Glaucoma Diabetics Family history of glaucoma African Americans 46 yrs + Hispanic Americans 5 yrs + Annually - requires coppay Yes 2019  Eye exam due for DM and Glaucoma  Hepatitis C Z72.89 or F19.20 High Risk for HCV Born between 1945 and 1965 Annually Once No 2022 Neg    HIV Z11.4 All adults based  on risk Annually btw ages 47 & 58 regardless of risk Annually > 65 yrs if at increased risk No 2018  Neg  Lung Cancer Screening Asymptomatic adults aged 15-77 with 30 pack yr history and current smoker OR quit within the last 15 yrs Annually Must have counseling and shared decision making documentation before first screen No   Never smoker  Medical Nutrition Therapy Adults with  Diabetes Renal disease Kidney transplant within past 3 yrs 3 hours first year; 2 hours subsequent years     Obesity and Counseling All adults Screening once a year Counseling if BMI 30 or higher  Today   Tobacco Use Counseling Adults who use tobacco  Up to 8 visits in one year     Vaccines Z23 Hepatitis B Influenza  Pneumonia  Adults  Once Once every flu season Two different vaccines separated by one year Yes  Due for Td, PNA 23 and Zoster  Next Annual Wellness Visit People with Medicare Every year  Today     Services & Screenings Women Who How Often Need  Date of Last Service Action  Mammogram  Z12.31 Women over 31 One baseline ages 79-39. Annually ager 32 yrs+ Yes 2021  Due this year  Pap tests All women Annually if high risk. Every 2 yrs for normal risk women No   Graduated  Screening  for cervical cancer with  Pap (Z01.419 nl or Z01.411abnl) & HPV Z11.51 Women aged 21 to 76 Once every 5 yrs     Screening pelvic and breast exams All women Annually if high risk. Every 2 yrs for normal risk women     Sexually Transmitted Diseases Chlamydia Gonorrhea Syphilis All at risk adults Annually for non pregnant females at increased risk         [place] Men Who How Ofter Need  Date of Last Service Action  Prostate Cancer - DRE & PSA Men over 50 Annually.  DRE might require a copay.        Sexually Transmitted Diseases Syphilis All at risk adults Annually for men at increased risk      Health Maintenance List Health Maintenance  Topic Date Due   Zoster Vaccines- Shingrix (1 of 2)  Never done   PNA vac Low Risk Adult (2 of 2 - PCV13) 08/27/2012   OPHTHALMOLOGY EXAM  08/21/2019   TETANUS/TDAP  09/19/2019   COVID-19 Vaccine (4 - Booster for Pfizer series) 12/22/2020   INFLUENZA VACCINE  06/18/2021   FOOT EXAM  06/23/2021   HEMOGLOBIN A1C  08/05/2021   LIPID PANEL  02/02/2022   DEXA SCAN  Completed   HPV VACCINES  Aged Out

## 2021-05-16 ENCOUNTER — Encounter: Payer: Medicare Other | Admitting: Internal Medicine

## 2021-05-22 ENCOUNTER — Encounter: Payer: Self-pay | Admitting: *Deleted

## 2021-05-25 ENCOUNTER — Other Ambulatory Visit: Payer: Self-pay

## 2021-05-25 ENCOUNTER — Other Ambulatory Visit: Payer: Self-pay | Admitting: Internal Medicine

## 2021-05-25 MED ORDER — FAMOTIDINE 20 MG PO TABS: 20.0000 mg | ORAL_TABLET | Freq: Every day | ORAL | 1 refills | Status: DC | PRN

## 2021-06-11 ENCOUNTER — Other Ambulatory Visit: Payer: Self-pay | Admitting: Internal Medicine

## 2021-06-11 DIAGNOSIS — E039 Hypothyroidism, unspecified: Secondary | ICD-10-CM

## 2021-06-13 ENCOUNTER — Other Ambulatory Visit: Payer: Self-pay | Admitting: Internal Medicine

## 2021-06-13 DIAGNOSIS — E119 Type 2 diabetes mellitus without complications: Secondary | ICD-10-CM

## 2021-07-09 ENCOUNTER — Other Ambulatory Visit: Payer: Self-pay | Admitting: Internal Medicine

## 2021-07-10 ENCOUNTER — Telehealth: Payer: Self-pay

## 2021-07-10 NOTE — Telephone Encounter (Signed)
Received a TC from patient who is c/o lower back pain X 1 week.  States pain is intermittent and with activity and tylenol only helps a little.  States she is going out of town to a funeral next week and wanted some pain medication for it.  RN informed patient she would need to be evaluated.  Appt made for 07/12/21 @ 1045 with Dr. Alfonse Spruce for evaluation.   SChaplin, RN,BSN

## 2021-07-12 ENCOUNTER — Encounter: Payer: Medicare Other | Admitting: Student

## 2021-07-20 ENCOUNTER — Encounter: Payer: Self-pay | Admitting: Internal Medicine

## 2021-07-20 ENCOUNTER — Other Ambulatory Visit: Payer: Self-pay

## 2021-07-20 ENCOUNTER — Ambulatory Visit (INDEPENDENT_AMBULATORY_CARE_PROVIDER_SITE_OTHER): Payer: Medicare Other | Admitting: Internal Medicine

## 2021-07-20 VITALS — BP 154/57 | HR 62 | Temp 98.2°F | Ht 64.0 in | Wt 128.3 lb

## 2021-07-20 DIAGNOSIS — M8588 Other specified disorders of bone density and structure, other site: Secondary | ICD-10-CM | POA: Diagnosis not present

## 2021-07-20 DIAGNOSIS — M47816 Spondylosis without myelopathy or radiculopathy, lumbar region: Secondary | ICD-10-CM

## 2021-07-20 DIAGNOSIS — E785 Hyperlipidemia, unspecified: Secondary | ICD-10-CM | POA: Diagnosis not present

## 2021-07-20 DIAGNOSIS — M790 Rheumatism, unspecified: Secondary | ICD-10-CM | POA: Diagnosis not present

## 2021-07-20 DIAGNOSIS — I25119 Atherosclerotic heart disease of native coronary artery with unspecified angina pectoris: Secondary | ICD-10-CM | POA: Diagnosis not present

## 2021-07-20 DIAGNOSIS — E119 Type 2 diabetes mellitus without complications: Secondary | ICD-10-CM | POA: Diagnosis not present

## 2021-07-20 DIAGNOSIS — M316 Other giant cell arteritis: Secondary | ICD-10-CM

## 2021-07-20 LAB — POCT GLYCOSYLATED HEMOGLOBIN (HGB A1C): Hemoglobin A1C: 6.6 % — AB (ref 4.0–5.6)

## 2021-07-20 LAB — GLUCOSE, CAPILLARY: Glucose-Capillary: 118 mg/dL — ABNORMAL HIGH (ref 70–99)

## 2021-07-20 MED ORDER — PREDNISONE 5 MG PO TABS: ORAL_TABLET | ORAL | 0 refills | Status: AC

## 2021-07-20 MED ORDER — ACETAMINOPHEN-CODEINE #3 300-30 MG PO TABS
1.0000 | ORAL_TABLET | Freq: Four times a day (QID) | ORAL | 0 refills | Status: DC | PRN
Start: 2021-07-20 — End: 2022-03-14

## 2021-07-20 NOTE — Progress Notes (Signed)
   CC: I'm here for my follow up appointment.  HPI: [name] is a 87 y.o. female with a history of HTN, HLD, type 2 DM, polymyalgia rheumatica, GERD, MDD, hypothyroidism, diastolic CHF, CKD 3, and COPD who presents for routine follow-up. She also reports 2 months of intermittent lower back pain and 2 weeks of occasional headache and right-sided jaw pain.   Please refer to problem based charting for further details of assessment and plan of current problem and chronic medical conditions.  Review of Systems  Constitutional:  Negative for fever and malaise/fatigue.  HENT:  Positive for hearing loss.   Eyes:  Negative for blurred vision.  Respiratory:  Negative for cough.   Cardiovascular:  Negative for chest pain and leg swelling.  Gastrointestinal:  Negative for abdominal pain, constipation, diarrhea and nausea.  Genitourinary:  Negative for dysuria.  Musculoskeletal:  Positive for back pain and myalgias. Negative for falls.  Neurological:  Positive for headaches. Negative for dizziness and tingling.  Psychiatric/Behavioral:  Negative for depression and hallucinations.    Physical Exam:  Vitals:   07/20/21 0928  BP: (!) 154/57  Pulse: 62  Temp: 98.2 F (36.8 C)  TempSrc: Oral  SpO2: 100%  Height: '5\' 4"'$  (1.626 m)   Physical Exam Vitals reviewed.  Constitutional:      Appearance: Normal appearance.  HENT:     Head: Normocephalic and atraumatic.  Eyes:     Extraocular Movements: Extraocular movements intact.     Pupils: Pupils are equal, round, and reactive to light.  Cardiovascular:     Rate and Rhythm: Normal rate and regular rhythm.  Pulmonary:     Effort: Pulmonary effort is normal.     Breath sounds: Normal breath sounds.  Abdominal:     General: Abdomen is flat.     Palpations: Abdomen is soft.  Musculoskeletal:     Lumbar back: Tenderness present. No swelling or signs of trauma.     Right upper leg: Tenderness present. No swelling or lacerations.      Left upper leg: Tenderness present. No swelling or lacerations.  Skin:    General: Skin is warm and dry.  Neurological:     General: No focal deficit present.     Mental Status: She is alert and oriented to person, place, and time.     Cranial Nerves: Cranial nerves are intact.  Psychiatric:        Mood and Affect: Mood normal.        Behavior: Behavior normal.    Assessment & Plan:   See Encounters Tab for problem based charting.  Patient seen with Dr. Daryll Drown

## 2021-07-20 NOTE — Assessment & Plan Note (Signed)
She takes a daily multivitamin. We will obtain updated DEXA scan given her multiple courses of steroids to assess for further bone loss.   Plan: DEXA ordered

## 2021-07-20 NOTE — Assessment & Plan Note (Signed)
Last lipid panel from 02/02/2021 showed LDL of 77. Cholesterol appears well controlled on current regimen. We will plan to recheck lipids today and continue atorvastatin.   Plan:  Lipid panel results pending Continue atorvastatin '20mg'$  daily

## 2021-07-20 NOTE — Assessment & Plan Note (Addendum)
Ms. Harshbarger describes occasional right-sided jaw pain when she chews which she noticed 2 weeks ago. She has also reports a headache that occurred once last week and another headache that lasted all day yesterday and is still present today. The headache is a dull pain across her forehead just superior to her eyebrows. She took Tylenol 3 times for the headache but it did not help much. She has a history of migraine headaches but states this feels different and that she has not had a migraine in 25 years. She denies any changes in her vision and today her neurological exam appears normal. She has declined temporal artery biopsy in the past but given her history of polymyalgia rheumatica and recent ESR of 105 in May as well as rapid improvement with steroids, I am concerned about a temporal arteritis flare. We will re-check ESR today and start a 2 week course of steroids. She has rheumatology follow-up in early November.  Plan:  -ESR results pending  -Start 2 week course of prednisone (20mg daily week 1, 10mg daily week 2) -Follow-up in 3 months -Rheum follow-up early Nov  

## 2021-07-20 NOTE — Assessment & Plan Note (Signed)
Patient describes 2 months of intermittent back pain. It is most uncomfortable when she stands up or when she tries to do Kegels as part of her pelvic floor physical therapy. She denies any trauma, fever, urinary incontinence, numbness, weakness, or saddle anesthesia. She does not have a personal history of cancer. This seems most consistent with her known lumbar spondylosis seen on imaging in 2015. She has taken Tylenol at home which has provided some relief. We will start Tylenol #3 PRN for pain, and we discussed only taking the Tylenol when the pain is bad and not taking more than '3000mg'$  in one day.    Plan:  Start Tylenol #3  Follow-up in 3 months

## 2021-07-20 NOTE — Assessment & Plan Note (Signed)
Christine [name] describes occasional right-sided jaw pain when she chews which she noticed 2 weeks ago. She has also reports a headache that occurred once last week and another headache that lasted all day yesterday and is still present today. The headache is a dull pain across her forehead just superior to her eyebrows. She took Tylenol 3 times for the headache but it did not help much. She has a history of migraine headaches but states this feels different and that she has not had a migraine in 25 years. She denies any changes in her vision and today her neurological exam appears normal. She has declined temporal artery biopsy in the past but given her history of polymyalgia rheumatica and recent ESR of 105 in May as well as rapid improvement with steroids, I am concerned about a temporal arteritis flare. We will re-check ESR today and start a 2 week course of steroids. She has rheumatology follow-up in early November.  Plan:  -ESR results pending  -Start 2 week course of prednisone (29m daily week 1, 150mdaily week 2) -Follow-up in 3 months -Rheum follow-up early Nov

## 2021-07-20 NOTE — Patient Instructions (Signed)
Thank you, Ms.Ticara R Thom for allowing [place] to provide your care today. Today we discussed your back pain and headache.    Headache - We are going to start a 2-week course of steroids because we think your headaches and jaw pain when chewing could be caused by inflammation of your temporal artery. Please take 80m (4 tablets) of prednisone every day with breakfast for 1 week then decrease to 148m(2 tablets) of prednisone for the next week. We are also checking blood work to see if there are signs of inflammation.   2. Back pain - We have sent in a prescription for a pain medicine with Tylenol and codeine to help with your back pain. Please do not take more than 300028mer day.    I have ordered the following labs for you:  Lab Orders         Glucose, capillary         Sed Rate (ESR)         Lipid Profile         BMP8+Anion Gap         POC Hbg A1C      Tests ordered today:  DEXA Bone Density scan    I have ordered the following medication/changed the following medications:   Start the following medications: Meds ordered this encounter  Medications   predniSONE (DELTASONE) 5 MG tablet    Sig: Take 4 tablets (20 mg total) by mouth daily with breakfast for 7 days, THEN 2 tablets (10 mg total) daily with breakfast for 7 days.    Dispense:  42 tablet    Refill:  0   acetaminophen-codeine (TYLENOL #3) 300-30 MG tablet    Sig: Take 1 tablet by mouth every 6 (six) hours as needed for severe pain.    Dispense:  30 tablet    Refill:  0      Follow up: 3 months    Remember: Please schedule your eye doctor appointment for November.   Should you have any questions or concerns please call the internal medicine clinic at [phone]   EmiGilles Chiquito.D. JesSabino DickedGreenwood

## 2021-07-20 NOTE — Assessment & Plan Note (Signed)
A1c today of 6.2%, at goal of <7%. She denies any episodes of hypoglycemia.   Plan:  Continue Glimepiride  Check BMP to assess renal function, results pending

## 2021-07-21 LAB — BMP8+ANION GAP
Anion Gap: 19 mmol/L — ABNORMAL HIGH (ref 10.0–18.0)
BUN/Creatinine Ratio: 24 (ref 12–28)
BUN: 27 mg/dL (ref 8–27)
CO2: 21 mmol/L (ref 20–29)
Calcium: 10.3 mg/dL (ref 8.7–10.3)
Chloride: 99 mmol/L (ref 96–106)
Creatinine, Ser: 1.12 mg/dL — ABNORMAL HIGH (ref 0.57–1.00)
Glucose: 108 mg/dL — ABNORMAL HIGH (ref 65–99)
Potassium: 4.5 mmol/L (ref 3.5–5.2)
Sodium: 139 mmol/L (ref 134–144)
eGFR: 48 mL/min/{1.73_m2} — ABNORMAL LOW (ref >59)

## 2021-07-21 LAB — SEDIMENTATION RATE: Sed Rate: 88 mm/hr — ABNORMAL HIGH (ref 0–40)

## 2021-07-21 LAB — LIPID PANEL
Chol/HDL Ratio: 3.4 ratio (ref 0.0–4.4)
Cholesterol, Total: 155 mg/dL (ref 100–199)
HDL: 46 mg/dL (ref >39)
LDL Chol Calc (NIH): 86 mg/dL (ref 0–99)
Triglycerides: 127 mg/dL (ref 0–149)
VLDL Cholesterol Cal: 23 mg/dL (ref 5–40)

## 2021-08-06 ENCOUNTER — Ambulatory Visit (INDEPENDENT_AMBULATORY_CARE_PROVIDER_SITE_OTHER): Payer: Medicare Other | Admitting: Internal Medicine

## 2021-08-06 ENCOUNTER — Other Ambulatory Visit: Payer: Self-pay

## 2021-08-06 DIAGNOSIS — M316 Other giant cell arteritis: Secondary | ICD-10-CM

## 2021-08-06 DIAGNOSIS — R519 Headache, unspecified: Secondary | ICD-10-CM | POA: Diagnosis not present

## 2021-08-06 DIAGNOSIS — E119 Type 2 diabetes mellitus without complications: Secondary | ICD-10-CM

## 2021-08-06 MED ORDER — PREDNISONE 5 MG PO TABS: ORAL_TABLET | ORAL | 0 refills | Status: AC

## 2021-08-06 MED ORDER — PREDNISONE 20 MG PO TABS: 20.0000 mg | ORAL_TABLET | Freq: Every day | ORAL | 0 refills | Status: AC

## 2021-08-06 MED ORDER — ONETOUCH ULTRA VI STRP: ORAL_STRIP | 3 refills | Status: DC

## 2021-08-06 NOTE — Telephone Encounter (Signed)
Please call pt back for meds.

## 2021-08-06 NOTE — Assessment & Plan Note (Addendum)
Patient presents for telehealth appointment concerning recurrent headache.  She states that she was seen approximately 2 weeks ago for headaches that included exacerbation on chewing, especially on the right side, and pain on the right side of her head.  She was prescribed prednisone 20 mg for 2 weeks, which he said immediately relieved her pain.  She states that she ran out of this medication last Thursday, with her headache returning.  She rates the headache as 7 out of 10, denies any visual disturbances, or loss of sight.  She states that she did try her Tylenol 3, prescribed for back pain, which provided little relief.  Similarly, extra Tylenol did not alleviate her headache.  Assessment: Patient presents with returning headache after being prescribed prednisone for possible GCA.  On her last office visit her sed rate was 88, and her symptoms immediately resolved upon starting prednisone.  She does not endorse vision loss, or vision issues at this time. She likely requires a prolonged taper for her GCA, sudden cessation can induce a flare.  She does have a rheumatology appointment in early November.  We will prescribe prednisone taper. - Start prednisone 20 mg daily for 14 days, followed by 50 mg for 14 days, followed by 10 mg 14 days, followed by 5 mg for 14 days, followed by 2.5 mg for 14 days. - Follow-up with rheumatology

## 2021-08-06 NOTE — Telephone Encounter (Signed)
Returned call to patient. States she completed prednisone taper on 9/15. H/A (frontal and right temporal) returned 9/16. States she mostly takes extra strength tylenol but today she took the T#3. Rated H/A 7/10 prior to T#3 and 5/10 3.5 hours later. She states, "my Sed rate is pretty high." (88) Wonders if PCP will put her back on prednisone. Given tele appt today with Red Team.  Also, requesting refill on One Touch test strips.

## 2021-08-06 NOTE — Progress Notes (Signed)
[facility] Internal Medicine Residency Telephone Encounter Continuity Care Appointment  HPI:  This telephone encounter was created for [name] on 08/06/2021 for the following purpose/cc headache.   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 LHC 08/23/11: dLM 40-50%, oRI 40%, oCFX 40%, oD1 70% (small and not amenable to PCI).  LM lesion did not appear to be flow limiting.  Medical rx was recommended;  b. Echo 08/23/11: mild LVH, EF 60-65%, grade 1 diast dysfxn, mild BAE, PASP 24;  c. 02/2012  Cath: LM 50d, LAD 50p, D1 70ost, RI 70p, RCA ok->Med Rx;  d. 01/2013 Cardiolite: EF 88, no ischemia/infarct.   Carotid stenosis    a. dopplers 10/12:  0-39% bilat ICA;  b. 10/2012 U/S: 0-39% bilat, f/u 1 yr (10/2013).   Chronic bronchitis    Chronotropic incompetence 05/01/2021   Depression    Diverticulosis of colon (without mention of hemorrhage)    GERD (gastroesophageal reflux disease)    Heart murmur, systolic    2-D echo in December 2011 showed a normal EF with grade 1 diastolic dysfunction, trivial pulmonary regurgitation and mildly elevated PA pressure at 37 mmHg probably secondary to her COPD.dynamic obstruction-mid cavity obliteration;  b. 02/2012 Echo: EF 60-65%, mild LVH, PASP 24mHg.   History of stomach ulcers 1970's   Hyperlipidemia    Hypertension    Hypothyroidism    Migraines    a. Next atypical symptoms in the past. Patient was started on Neurontin for possible neuropathic origin of her pain.   Polymyalgia rheumatica (HCC)    Premature atrial contraction 05/01/2021   She remains asymptomatic.  No  medication indicated.   Shortness of breath on exertion    "just related to angina >1 yr ago" (09/23/2013)   Sinus arrhythmia    Solitary pulmonary nodule 12/15/2006   CT Chest in January 2008  IMPRESSION:  1. Stable CT of the chest with mild biapical scarring and scattered nodularity, most likely postinflammatory in the absence of a history of malignancy.  2. No acute chest findings are demonstrated.  3. Unless the patient has a history of malignancy or risk factors for lung cancer, the chest findings do not necessarily require any specific followup. If follow    Type II diabetes mellitus [place]    a. On oral hypoglycemic agents.     ROS:  Review of Systems  Constitutional:  Negative for chills, diaphoresis, fever, malaise/fatigue and weight loss.  Eyes:  Negative for blurred vision, double vision, photophobia, pain and discharge.  Gastrointestinal:  Negative for abdominal pain, nausea and vomiting.  Neurological:  Positive for headaches. Negative for dizziness, tingling, tremors, sensory change and speech change.     Assessment / Plan / Recommendations:  Please see A&P under problem oriented charting for assessment of the patient's acute and chronic medical conditions.  As always, pt is advised that if symptoms worsen or new symptoms arise, they should go to an urgent care facility or to to ER for further evaluation.   Consent and Medical Decision Making:  Patient  discussed with Dr. Jimmye Norman This is a telephone encounter between Christine [name] and Christine [name] on 08/06/2021 for persistent headaches. The visit was conducted with the patient located at home and Christine [name] at [facility]. The patient's identity was confirmed using their DOB and current address. The patient has consented to being evaluated through a telephone encounter and understands the associated risks (an examination cannot be done and the patient may need to come in for an appointment) / benefits (allows the patient to remain at  home, decreasing exposure to coronavirus). I personally spent 11 minutes on medical discussion.

## 2021-08-07 ENCOUNTER — Telehealth: Payer: Self-pay

## 2021-08-07 NOTE — Telephone Encounter (Signed)
Pa  came through for pt ONE touch ultra blue  pt has  medicaid  and on the [place] [employer] if you change the device to ACCU_CHEK guide  retail care kit or the accu-chek guide me tail  care kit .Marland Kitchen it will be covered under the outpatient  pharmacy program ....  So can you please change and send to a out patient pharmacy and she should be good to go ... Thank you

## 2021-08-10 DIAGNOSIS — Z23 Encounter for immunization: Secondary | ICD-10-CM | POA: Diagnosis not present

## 2021-08-22 ENCOUNTER — Other Ambulatory Visit: Payer: Self-pay | Admitting: Internal Medicine

## 2021-08-22 DIAGNOSIS — F3342 Major depressive disorder, recurrent, in full remission: Secondary | ICD-10-CM

## 2021-08-23 NOTE — Telephone Encounter (Signed)
Next appt scheduled 10/19/21 with PCP.

## 2021-09-10 DIAGNOSIS — E119 Type 2 diabetes mellitus without complications: Secondary | ICD-10-CM | POA: Diagnosis not present

## 2021-09-10 LAB — HM DIABETES EYE EXAM

## 2021-09-24 DIAGNOSIS — Z961 Presence of intraocular lens: Secondary | ICD-10-CM | POA: Diagnosis not present

## 2021-09-24 DIAGNOSIS — H26491 Other secondary cataract, right eye: Secondary | ICD-10-CM | POA: Diagnosis not present

## 2021-10-01 DIAGNOSIS — Z961 Presence of intraocular lens: Secondary | ICD-10-CM | POA: Diagnosis not present

## 2021-10-19 ENCOUNTER — Encounter: Payer: Self-pay | Admitting: Internal Medicine

## 2021-10-19 ENCOUNTER — Ambulatory Visit (INDEPENDENT_AMBULATORY_CARE_PROVIDER_SITE_OTHER): Payer: Medicare Other | Admitting: Internal Medicine

## 2021-10-19 VITALS — BP 160/60 | HR 87 | Temp 98.2°F | Ht 64.0 in | Wt 131.6 lb

## 2021-10-19 DIAGNOSIS — M316 Other giant cell arteritis: Secondary | ICD-10-CM | POA: Diagnosis not present

## 2021-10-19 DIAGNOSIS — I25119 Atherosclerotic heart disease of native coronary artery with unspecified angina pectoris: Secondary | ICD-10-CM

## 2021-10-19 DIAGNOSIS — J302 Other seasonal allergic rhinitis: Secondary | ICD-10-CM

## 2021-10-19 DIAGNOSIS — M5412 Radiculopathy, cervical region: Secondary | ICD-10-CM

## 2021-10-19 DIAGNOSIS — E119 Type 2 diabetes mellitus without complications: Secondary | ICD-10-CM

## 2021-10-19 DIAGNOSIS — E1122 Type 2 diabetes mellitus with diabetic chronic kidney disease: Secondary | ICD-10-CM | POA: Diagnosis not present

## 2021-10-19 DIAGNOSIS — I129 Hypertensive chronic kidney disease with stage 1 through stage 4 chronic kidney disease, or unspecified chronic kidney disease: Secondary | ICD-10-CM | POA: Diagnosis not present

## 2021-10-19 DIAGNOSIS — I491 Atrial premature depolarization: Secondary | ICD-10-CM | POA: Diagnosis not present

## 2021-10-19 DIAGNOSIS — F3342 Major depressive disorder, recurrent, in full remission: Secondary | ICD-10-CM

## 2021-10-19 DIAGNOSIS — N183 Chronic kidney disease, stage 3 unspecified: Secondary | ICD-10-CM

## 2021-10-19 DIAGNOSIS — E039 Hypothyroidism, unspecified: Secondary | ICD-10-CM

## 2021-10-19 DIAGNOSIS — I10 Essential (primary) hypertension: Secondary | ICD-10-CM

## 2021-10-19 DIAGNOSIS — K219 Gastro-esophageal reflux disease without esophagitis: Secondary | ICD-10-CM | POA: Diagnosis not present

## 2021-10-19 LAB — POCT GLYCOSYLATED HEMOGLOBIN (HGB A1C): Hemoglobin A1C: 7.5 % — AB (ref 4.0–5.6)

## 2021-10-19 LAB — GLUCOSE, CAPILLARY: Glucose-Capillary: 131 mg/dL — ABNORMAL HIGH (ref 70–99)

## 2021-10-19 MED ORDER — ZOSTER VAC RECOMB ADJUVANTED 50 MCG/0.5ML IM SUSR: 0.5000 mL | Freq: Once | INTRAMUSCULAR | 0 refills | Status: AC

## 2021-10-19 MED ORDER — FLUTICASONE-SALMETEROL 250-50 MCG/ACT IN AEPB: INHALATION_SPRAY | RESPIRATORY_TRACT | 3 refills | Status: DC

## 2021-10-19 NOTE — Patient Instructions (Signed)
Ms. Rabel -   It was lovely to see you!   Please come back to see me in 3 months.   Please continue all of your medications as prescribed, I have refilled your Advair.    I will make sure that your lancets and test strips get filled.    Thank you!

## 2021-10-19 NOTE — Progress Notes (Signed)
   Subjective:    [name], female    DOB: 10-22-1933, 87 y.o.   MRN: [id number]  CC:   HPI  Christine [name] is an 87 year old woman with PMH of GCA, allergies, radiculopathy, CAD, HFpEF, CKD3, HTN, GERD, LDL, hypothyroidism, DM2 who presents for follow up.   Doing well, no complaints.  Pain improved.  Last steroid today and I have advised her to call the clinic if pain recurs.    Overall, she notes no complaints.  She has had difficulty getting her test strips, but otherwise feels that she is getting all of her medications.   Asking about Shingrix vaccination.  She brought in her flu shot and COVID booster information.   Review of Systems  Constitutional:  Negative for activity change, appetite change, diaphoresis and fatigue.  HENT:         Hard of hearing.   Eyes:  Negative for pain and redness.  Respiratory:  Negative for cough and shortness of breath.   Cardiovascular:  Negative for chest pain, palpitations and leg swelling.  Gastrointestinal:  Negative for abdominal pain and nausea.  Musculoskeletal:  Positive for arthralgias (mild, hands, chronic) and joint swelling (chronic). Negative for neck pain.  Skin:  Negative for color change, pallor and rash.  Neurological:  Negative for dizziness, speech difficulty, weakness and headaches.  Psychiatric/Behavioral:  Negative for decreased concentration and dysphoric mood.       Objective:   Physical Exam Vitals and nursing note reviewed.  Constitutional:      General: She is not in acute distress.    Appearance: Normal appearance. She is normal weight. She is not toxic-appearing.  HENT:     Head: Normocephalic and atraumatic.  Cardiovascular:     Rate and Rhythm: Normal rate.  Pulmonary:     Effort: Pulmonary effort is normal. No respiratory distress.  Abdominal:     General: Abdomen is flat. There is no distension.  Musculoskeletal:        General: No swelling or tenderness.     Right lower leg: No edema.      Left lower leg: No edema.     Comments: She has chronic changes to the small joints of her hands which are non tender today.  Skin:    Coloration: Skin is not jaundiced or pale.  Neurological:     Mental Status: She is alert.    A1C: 7.5 today, just finished steroid course BMET and TSH today.       Assessment & Plan:  REturn in 3 months to see me, call the clinic if headache/thigh pain recur

## 2021-10-20 LAB — BMP8+ANION GAP
Anion Gap: 18 mmol/L (ref 10.0–18.0)
BUN/Creatinine Ratio: 16 (ref 12–28)
BUN: 18 mg/dL (ref 8–27)
CO2: 21 mmol/L (ref 20–29)
Calcium: 9.8 mg/dL (ref 8.7–10.3)
Chloride: 100 mmol/L (ref 96–106)
Creatinine, Ser: 1.14 mg/dL — ABNORMAL HIGH (ref 0.57–1.00)
Glucose: 138 mg/dL — ABNORMAL HIGH (ref 70–99)
Potassium: 4.2 mmol/L (ref 3.5–5.2)
Sodium: 139 mmol/L (ref 134–144)
eGFR: 47 mL/min/{1.73_m2} — ABNORMAL LOW (ref >59)

## 2021-10-20 LAB — TSH: TSH: 4.52 u[IU]/mL — ABNORMAL HIGH (ref 0.450–4.500)

## 2021-10-22 ENCOUNTER — Telehealth: Payer: Self-pay | Admitting: *Deleted

## 2021-10-22 DIAGNOSIS — E119 Type 2 diabetes mellitus without complications: Secondary | ICD-10-CM

## 2021-10-22 NOTE — Assessment & Plan Note (Signed)
She continues with her glimepiride.  She has a mildly higher A1C today given her recent steroid use.  Now that she is off the steroids, I would opt to continue her current dose of medication given age and previous A1C < 7 on current dose.  She needs refills on her lancets and test strips which I will fill out today.  LDL at last check was 86.  BP is elevated today, see HTN problem.  I reminded her to get an eye exam.   Plan Continue glimepiride Return in 3 months for A1C check.

## 2021-10-22 NOTE — Assessment & Plan Note (Signed)
No complaints today.  She will let me know if this issue worsens or recurs.

## 2021-10-22 NOTE — Assessment & Plan Note (Signed)
We will check a BMET today to ensure stability of her renal function.  Continue good DM and HTN control.

## 2021-10-22 NOTE — Assessment & Plan Note (Signed)
She notes continued improvement in the pain in her arms and hands.  She has not needed further injections.   Plan Continue to monitor Return for repeat injectable therapy if needed.

## 2021-10-22 NOTE — Assessment & Plan Note (Signed)
Asymptomatic at this time.  She has not tolerated beta blockers in the past.  Continue to monitor.

## 2021-10-22 NOTE — Telephone Encounter (Signed)
-----   Message from [name], MD sent at 10/22/2021 10:49 AM EST ----- Ollen Gross - -  I need paper work from [facility]. [facility] pharmacy to get her strips/lancets.  I thought I had some in my box but do not.  Can you request they fax it?  Thanks!!  Gilles Chiquito, MD

## 2021-10-22 NOTE — Assessment & Plan Note (Signed)
Her mood continues to be stable.  She has had no recurrence of symptoms.

## 2021-10-22 NOTE — Assessment & Plan Note (Signed)
She is on a very low dose of synthroid.  Doing well, no complaints, no worsening symptoms.   Check TSH today Continue synthroid.

## 2021-10-22 NOTE — Telephone Encounter (Signed)
I called [facility] for the Diabetic Detailed Written Order form. Stated they will call Medicare to fax the form to [place]. [facility] also requesting rx for test strips be re-send.

## 2021-10-22 NOTE — Assessment & Plan Note (Signed)
Blood pressure is 160/60. She has wide pulse pressure and has not tolerated beta blockade or increased medication in the past.  She brought in her log which showed most systolic pressures around 140 and at times as low as 96.  I would continue her on her medications as is today.    Plan Continue amlodipine and valsartan.

## 2021-10-22 NOTE — Assessment & Plan Note (Signed)
She notes that her headaches and body aches have improved significantly.  She took her last dose of prednisone today from the taper pack.  She has had post steroid flares in the past, so I asked her to call for any recurrence.  She is not interested in the biopsy for confirmation, but she has been treated for PMR/GCA for many years at this point.  No change in vision.   Plan Continue to monitor Treat as needed with course of steroids.

## 2021-10-26 ENCOUNTER — Other Ambulatory Visit: Payer: Self-pay | Admitting: *Deleted

## 2021-10-26 DIAGNOSIS — E119 Type 2 diabetes mellitus without complications: Secondary | ICD-10-CM

## 2021-10-26 MED ORDER — ONETOUCH ULTRA VI STRP: ORAL_STRIP | 3 refills | Status: DC

## 2021-10-26 NOTE — Telephone Encounter (Signed)
Call from patient and call tp Pharmacy problem with prescription for the OneTouch Test Strips.  Pharmacy requesting new prescription for.  Have run and gotten approval for the Lancets.  Will send message to PCP to rewrite prescription.

## 2021-10-31 ENCOUNTER — Telehealth: Payer: Self-pay | Admitting: *Deleted

## 2021-10-31 NOTE — Telephone Encounter (Signed)
Call from patient needs form filed out in order to get her prescription for Diabetic supplies .  Has enough for a couple days.  Form placed in Dr. Doristine Section box to complete.

## 2021-11-02 ENCOUNTER — Encounter: Payer: Self-pay | Admitting: Internal Medicine

## 2021-11-07 DIAGNOSIS — U071 COVID-19: Secondary | ICD-10-CM | POA: Diagnosis not present

## 2021-11-13 ENCOUNTER — Telehealth: Payer: Self-pay | Admitting: *Deleted

## 2021-11-13 NOTE — Telephone Encounter (Addendum)
Call from pt. Stated her daughter was feeling sick Wed of last week and went to UC, tested covid +.  She was also not feeling well so she went to UC also, tested covid + and was given rx for Lagevrio. Stated on Sunday she was talking incoherently and repeating herself, so her daughter called EMS; they came and stated it might be her BP but pt stated it was ok. Offered to take her to the hospital but pt refused.  Denies fever,sob. Stated her appetite is good. She also stated she had bleeding from her prolapse x 4 days ; stopped 2 days ago. I asked her how's she feeling - stated she was feeling ok until today. Requested an appt w/Dr Daryll Drown on Friday - informed the office will be closed. No available appt until Thurs - telehealth appt given with Dr Cheral Almas @ [phone]. I asked pt to do an at home covid test if she can to re-test herself - stated she will ask her daughter. Stated her daughter will be with her at the telehealth visit call. If pt needs an in person visit instead let me know.  Thanks

## 2021-11-15 ENCOUNTER — Ambulatory Visit (INDEPENDENT_AMBULATORY_CARE_PROVIDER_SITE_OTHER): Payer: Medicare Other | Admitting: Internal Medicine

## 2021-11-15 DIAGNOSIS — K623 Rectal prolapse: Secondary | ICD-10-CM | POA: Diagnosis not present

## 2021-11-15 DIAGNOSIS — U071 COVID-19: Secondary | ICD-10-CM | POA: Diagnosis not present

## 2021-11-15 MED ORDER — BENZONATATE 200 MG PO CAPS: 200.0000 mg | ORAL_CAPSULE | Freq: Three times a day (TID) | ORAL | 0 refills | Status: DC | PRN

## 2021-11-15 MED ORDER — PREDNISONE 20 MG PO TABS: 40.0000 mg | ORAL_TABLET | Freq: Every day | ORAL | 0 refills | Status: AC

## 2021-11-15 NOTE — Progress Notes (Signed)
[facility] Internal [facility] Residency Telephone Encounter Continuity Care Appointment  HPI:  This telephone encounter was created for 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 LHC 08/23/11: dLM 40-50%, oRI 40%, oCFX 40%, oD1 70% (small and not amenable to PCI).  LM lesion did not appear to be flow limiting.  Medical rx was recommended;  b. Echo 08/23/11: mild LVH, EF 60-65%, grade 1 diast dysfxn, mild BAE, PASP 24;  c. 02/2012  Cath: LM 50d, LAD 50p, D1 70ost, RI 70p, RCA ok->Med Rx;  d. 01/2013 Cardiolite: EF 88, no ischemia/infarct.   Carotid stenosis    a. dopplers 10/12:  0-39% bilat ICA;  b. 10/2012 U/S: 0-39% bilat, f/u 1 yr (10/2013).   Chronic bronchitis    Chronotropic incompetence 05/01/2021   Depression    Diverticulosis of colon (without mention of hemorrhage)    GERD (gastroesophageal reflux disease)    Heart murmur, systolic    2-D echo in December 2011 showed a normal EF with grade 1 diastolic dysfunction, trivial pulmonary regurgitation and mildly elevated PA pressure at 37 mmHg probably secondary to her COPD.dynamic obstruction-mid cavity obliteration;  b. 02/2012 Echo: EF 60-65%, mild LVH, PASP 20mmHg.   History of stomach ulcers 1970's   Hyperlipidemia    Hypertension    Hypothyroidism    Migraines    a. Next atypical symptoms in the past. Patient was started on Neurontin for possible neuropathic origin of her pain.   Polymyalgia rheumatica (HCC)    Premature atrial contraction 05/01/2021   She remains  asymptomatic.  No medication indicated.   Shortness of breath on exertion    "just related to angina >1 yr ago" (09/23/2013)   Sinus arrhythmia    Solitary pulmonary nodule 12/15/2006   CT Chest in January 2008  IMPRESSION:  1. Stable CT of the chest with mild biapical scarring and scattered nodularity, most likely postinflammatory in the absence of a history of malignancy.  2. No acute chest findings are demonstrated.  3. Unless the patient has a history of malignancy or risk factors for lung cancer, the chest findings do not necessarily require any specific followup. If follow    Type II diabetes mellitus ([place])    a. On oral hypoglycemic agents.     ROS:  + cough, increased sputum production - SOB    Assessment / Plan / Recommendations:  Please see A&P under problem oriented charting for assessment of the patient's acute and chronic medical conditions.  As always, pt is advised that if symptoms worsen or new symptoms arise, they should go to an urgent care facility or to to ER for further evaluation.   Consent and Medical Decision Making:  Patient discussed with Dr. Angelia Mould This is a telephone encounter between Christine [name] and Jose Persia on 11/15/2021 for COVID-19 infection. The visit was conducted with the patient located at home and Jose Persia at [facility]. The patient's identity was confirmed using their DOB and current  address. The patient has consented to being evaluated through a telephone encounter and understands the associated risks (an examination cannot be done and the patient may need to come in for an appointment) / benefits (allows the patient to remain at home, decreasing exposure to coronavirus). I personally spent 15 minutes on medical discussion.

## 2021-11-20 NOTE — Assessment & Plan Note (Signed)
Telehealth appointment today with Christine [name] and her daughter Christine [name], with whom Christine [name] lives with.  Christine [name] states that she first became ill on Tuesday and tested positive for COVID-19 on Wednesday, December 21.  That same day, Christine [name] began to feel ill and tested positive for COVID-19 as well.  Christine [name] endorses cough with sputum production, nausea, and congestion.  Overall, her symptoms of cough have not began to improve and seems somewhat worsened.  She has been taking Robitussin-DM without any improvement.  Christine [name] is requesting a prescription for prednisone for which Christine [name] has received in the past for COPD exacerbations.  Christine [name] denies any fever, chills, shortness of breath or chest pain.  10 minutes prior to her telephone appointment, Christine [name] took patient's vitals: O2 saturation: 97% BP: 117/62 Temperature: 98.4   Assessment/Plan:  Per chart review, I do not see any evidence of COPD, however previous imaging has noted diffuse emphysematous changes consistent with COPD.  Due to this, will treat as a COPD exacerbation secondary to COVID-19 infection.  Discussed extensively return precautions including shortness of breath, chest pain, or decreases in oxygen saturation; Christine [name] expressed understanding.  - Prednisone 40 mg daily for 5 days - Tessalon Perles 200 mg 3 times daily as needed for cough - Follow-up in 7 to 10 days

## 2021-11-20 NOTE — Assessment & Plan Note (Signed)
Christine [name] states that her rectal prolapse began to bleed several days ago but stopped approximately 4 days ago. She is requesting a follow-up with her PCP Dr. Daryll Drown to discuss this further  - Message sent to front desk to schedule a follow-up with Dr. Daryll Drown at her next available appointment.

## 2021-11-23 ENCOUNTER — Other Ambulatory Visit: Payer: Self-pay

## 2021-11-23 ENCOUNTER — Encounter: Payer: Self-pay | Admitting: Internal Medicine

## 2021-11-23 ENCOUNTER — Ambulatory Visit (INDEPENDENT_AMBULATORY_CARE_PROVIDER_SITE_OTHER): Payer: Medicare Other | Admitting: Internal Medicine

## 2021-11-23 VITALS — BP 133/64 | HR 94 | Temp 98.3°F | Resp 32 | Ht 64.0 in | Wt 130.0 lb

## 2021-11-23 DIAGNOSIS — U071 COVID-19: Secondary | ICD-10-CM

## 2021-11-23 DIAGNOSIS — E119 Type 2 diabetes mellitus without complications: Secondary | ICD-10-CM | POA: Diagnosis not present

## 2021-11-23 DIAGNOSIS — I10 Essential (primary) hypertension: Secondary | ICD-10-CM | POA: Diagnosis not present

## 2021-11-23 LAB — POCT GLYCOSYLATED HEMOGLOBIN (HGB A1C): Hemoglobin A1C: 7.5 % — AB (ref 4.0–5.6)

## 2021-11-23 LAB — GLUCOSE, CAPILLARY: Glucose-Capillary: 159 mg/dL — ABNORMAL HIGH (ref 70–99)

## 2021-11-23 MED ORDER — PREDNISONE 20 MG PO TABS: 40.0000 mg | ORAL_TABLET | Freq: Every day | ORAL | 0 refills | Status: AC

## 2021-11-23 MED ORDER — PREDNISONE 5 MG PO TABS: 20.0000 mg | ORAL_TABLET | Freq: Every day | ORAL | 0 refills | Status: DC

## 2021-11-23 MED ORDER — BENZONATATE 200 MG PO CAPS: 200.0000 mg | ORAL_CAPSULE | Freq: Three times a day (TID) | ORAL | 0 refills | Status: AC | PRN

## 2021-11-23 NOTE — Progress Notes (Signed)
CC: persistent cough after Covid infection  HPI:  [name] is a 88 y.o. female with a past medical history stated below and presents today for persistent cough after Covid infection. Please see problem based assessment and plan for additional details.  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 LHC 08/23/11: dLM 40-50%, oRI 40%, oCFX 40%, oD1 70% (small and not amenable to PCI).  LM lesion did not appear to be flow limiting.  Medical rx was recommended;  b. Echo 08/23/11: mild LVH, EF 60-65%, grade 1 diast dysfxn, mild BAE, PASP 24;  c. 02/2012  Cath: LM 50d, LAD 50p, D1 70ost, RI 70p, RCA ok->Med Rx;  d. 01/2013 Cardiolite: EF 88, no ischemia/infarct.   Carotid stenosis    a. dopplers 10/12:  0-39% bilat ICA;  b. 10/2012 U/S: 0-39% bilat, f/u 1 yr (10/2013).   Chronic bronchitis    Chronotropic incompetence 05/01/2021   Depression    Diverticulosis of colon (without mention of hemorrhage)    GERD (gastroesophageal reflux disease)    Heart murmur, systolic    2-D echo in December 2011 showed a normal EF with grade 1 diastolic dysfunction, trivial pulmonary regurgitation and mildly elevated PA pressure at 37 mmHg probably secondary to her COPD.dynamic obstruction-mid cavity obliteration;  b. 02/2012 Echo: EF 60-65%, mild LVH, PASP 66mmHg.   History of stomach ulcers 1970's   Hyperlipidemia    Hypertension    Hypothyroidism    Migraines    a. Next atypical symptoms in the past. Patient was started on Neurontin for possible neuropathic origin of her pain.   Polymyalgia rheumatica (HCC)    Premature atrial contraction 05/01/2021   She  remains asymptomatic.  No medication indicated.   Shortness of breath on exertion    "just related to angina >1 yr ago" (09/23/2013)   Sinus arrhythmia    Solitary pulmonary nodule 12/15/2006   CT Chest in January 2008  IMPRESSION:  1. Stable CT of the chest with mild biapical scarring and scattered nodularity, most likely postinflammatory in the absence of a history of malignancy.  2. No acute chest findings are demonstrated.  3. Unless the patient has a history of malignancy or risk factors for lung cancer, the chest findings do not necessarily require any specific followup. If follow    Type II diabetes mellitus ([place])    a. On oral hypoglycemic agents.    Current Outpatient Medications on File Prior to Visit  Medication Sig Dispense Refill   levothyroxine (SYNTHROID) 25 MCG tablet TAKE 1 TABLET BY MOUTH EVERY DAY 90 tablet 1   acetaminophen (TYLENOL) 500 MG tablet Take 1,000 mg by mouth every 6 (six) hours as needed for headache. For pain     acetaminophen-codeine (TYLENOL #3) 300-30 MG tablet Take 1 tablet by mouth every 6 (six) hours as needed for severe pain. 30 tablet 0   amLODipine (NORVASC) 10 MG tablet TAKE 1 TABLET(10 MG) BY MOUTH DAILY 90 tablet 3   Ascorbic Acid (VITAMIN C PO) Take 2 tablets by mouth daily.  atorvastatin (LIPITOR) 40 MG tablet TAKE 0.5 TABLETS BY MOUTH EVERY EVENING 45 tablet 3   benzonatate (TESSALON) 200 MG capsule Take 1 capsule (200 mg total) by mouth 3 (three) times daily as needed for cough. 20 capsule 0   clopidogrel (PLAVIX) 75 MG tablet TAKE 1 TABLET BY MOUTH EVERY EVENING 90 tablet 3   famotidine (PEPCID) 20 MG tablet Take 1 tablet (20 mg total) by mouth daily as needed for heartburn or indigestion. 90 tablet 1   fluticasone (FLONASE) 50 MCG/ACT nasal spray PLACE 1 SPRAY IN EACH NOSTRIL EVERY DAY 16 g 2   fluticasone-salmeterol (ADVAIR DISKUS) 250-50 MCG/ACT AEPB INHALE 1 PUFF BY MOUTH EVERY 12 HOURS 60 each 3   glimepiride (AMARYL) 1 MG tablet take 1  tablet by mouth once daily BEFORE BREAKFAST. 90 tablet 3   glucose blood (ONETOUCH ULTRA) test strip Use once daily to check blood sugar before breakfast. 100 each 3   imipramine (TOFRANIL) 50 MG tablet TAKE 1 TABLET(50 MG) BY MOUTH TWICE DAILY 180 tablet 3   Lancets (ONETOUCH DELICA PLUS IONGEX52W) MISC TEST ONCE DAILY 100 each 11   Multiple Vitamin (MULTIVITAMIN WITH MINERALS) TABS tablet Take 1 tablet by mouth daily. (Patient taking differently: Take 2 tablets by mouth daily.) 30 tablet 2   ondansetron (ZOFRAN) 4 MG tablet Take 1 tablet (4 mg total) by mouth every 8 (eight) hours as needed for nausea or vomiting. 20 tablet 0   valsartan (DIOVAN) 320 MG tablet TAKE 1 TABLET(320 MG) BY MOUTH DAILY 90 tablet 3   No current facility-administered medications on file prior to visit.    Family History  Problem Relation Age of Onset   Colon cancer Maternal Grandmother    Heart attack Maternal Grandmother    COPD Father    Other Mother        died of unknown causes in her 59's. Pt raised by grandmother.   Pulmonary Hypertension Daughter      Review of Systems: ROS negative except for what is noted on the assessment and plan.  Vitals:   11/23/21 1012 11/23/21 1020  BP: (!) 162/51 133/64  Pulse: 91 94  Resp: (!) 32   Temp: 98.3 F (36.8 C)   TempSrc: Oral   SpO2: 100%   Weight: 130 lb (59 kg)   Height: 5\' 4"  (1.626 m)      Physical Exam: General: Well appearing elderly African-American female, NAD HENT: normocephalic, atraumatic EYES: conjunctiva non-erythematous, no scleral icterus CV: Tachycardia, normal rhythm, no murmurs, rubs, gallops.  No lower extremity edema. Pulmonary: Mildly increased work of breathing on RA, lungs clear to auscultation, no rales, wheezes, rhonchi, mild transmitted upper airway sounds, coughing intermittently during exam Abdominal: non-distended, soft, non-tender to palpation, normal BS Skin: Warm and dry, no rashes or lesions Neurological: MS:  awake, alert and oriented x3, normal speech and fund of knowledge Motor: moves all extremities antigravity Psych: normal affect    Assessment & Plan:   See Encounters Tab for problem based charting.  Patient seen with Dr. Dewayne Shorter, M.D. [facility], PGY-1 Pager: [phone] Date 11/23/2021 Time 10:24 AM

## 2021-11-23 NOTE — Assessment & Plan Note (Signed)
Last hemoglobin A1c 6.6% 07/2021.  Hemoglobin A1c today 7.5%, in the setting of recent steroid use for COVID-19.  Patient's daughter present today.  Reports PCP needs to sign paperwork to obtain further glucose check strips.  Per daughter, nursing staff informed her paperwork sent to PCP for signature.   Today Ms. Goll reports symptoms of hypoglycemia in the setting of recent COVID infection, persistent fatigue, potentially eating less.  Discussed holding glimepiride if CBGs are low.  If a.m. CBGs start elevated in the setting of additional steroid course for COVID-19 symptoms, can restart glimepiride.  Plan: -Patient to hold glimepiride if CBGs are low -Next A1c in 3 months -Follow-up appointment with Dr. Daryll Drown 1/13

## 2021-11-23 NOTE — Patient Instructions (Signed)
Thank you, [name] for allowing [place] to provide your care today. Today we discussed:  Persistent cough after COVID-19 infection: COVID-19 symptoms including cough, fatigue, and chest congestion can persist for several weeks following infection.  Since a short course of prednisone improved the symptoms when they were prescribed at the end of December, we will prescribe another short course of prednisone and Tessalon Perles to get you through this phase of the COVID infection.  Many patients will have a slow improvement in symptoms.  If your symptoms persist longer than 4 weeks, this would be considered long COVID.  Low blood sugars: Given the low blood sugars you reported while on the glimepiride in the setting of feeling unwell, eating less due to COVID infection, we will have you intermittently hold your glimepiride if you are having low blood sugars.  If you notice your morning blood sugars are starting to increase, resume taking the glimepiride.  Prednisone can increase your blood sugars, it will be important to monitor these closely.   You have a follow-up appointment with Dr. Daryll Drown next week on Friday.  Please call the office sooner if you have any concerns that, prior to her next appointment.  Please make sure to arrive 15 minutes prior to your next appointment. If you arrive late, you may be asked to reschedule.    We look forward to seeing you next time. Please call our clinic at [phone] if you have any questions or concerns. The best time to call is Monday-Friday from 9am-4pm, but there is someone available 24/7. If after hours or the weekend, call the main hospital number and ask for the Internal Medicine Resident On-Call. If you need medication refills, please notify your pharmacy one week in advance and they will send [place] a request.   Thank you for letting us take part in your care. Wishing you the best!  [name], MD 11/23/2021, 11:19 AM IM Resident, PGY-1

## 2021-11-23 NOTE — Progress Notes (Signed)
Internal Medicine Clinic Attending  Case discussed with Dr. Basaraba  At the time of the visit.  We reviewed the resident's history and exam and pertinent patient test results.  I agree with the assessment, diagnosis, and plan of care documented in the resident's note.  

## 2021-11-23 NOTE — Assessment & Plan Note (Addendum)
Patient presents for OV for persistent cough following COVID-19 infection.  Patient seen today with her daughter Annamary Carolin.  Patient tested positive for COVID-19 11/07/22.  Patient became symptomatic a few days following positive test.  Patient's symptoms included a cough, sputum production, nasal congestion, fatigue.  Her symptoms improved following short course of prednisone and Tessalon prescribed by Dr. Charleen Kirks 12/29.  Unfortunately her symptoms worsened 24 to 48 hours following end of prednisone course.  Today she reports persistent cough with increased sputum production and change in sputum to thicker yellow color.  She endorses persistent fatigue and nasal congestion.  She has continued to take allergy medication, Flonase, Advair.   On exam, patient afebrile, mildly tachycardic to 90s, otherwise hemodynamically stable.  Lungs clear to auscultation, no wheezes, mild transmitted upper respiratory sounds.  Mild tachypnea.  On assessment, patient likely has persistence of COVID-19 symptoms which can include cough, sputum production, symptoms of tracheitis.  Differential diagnosis included HFpEF exacerbation, COPD exacerbation, bacterial pneumonia, though given exam less likely. Discussed with patient and daughter that this is likely part of the natural course of patient's COVID-19 infection.  Symptoms can persist for 4 weeks, can even persist beyond 4 weeks and at this time this would be considered long COVID. Discussed signs of secondary bacterial infection and other symptoms for which she would call the office, report to urgent care, or present to the emergency room.  Plan: -Prednisone 40 mg once daily for 5 days -Tessalon tablets 3 times daily as needed for 5 days -Continue supportive management -Follow-up with Dr. Daryll Drown on 1/13

## 2021-11-23 NOTE — Assessment & Plan Note (Signed)
Blood pressure on arrival 162/51, though repeat 133/64.  Patient's blood pressure currently well controlled on current regimen.  Plan: -Continue amlodipine 10 mg daily -Continue valsartan 320 mg daily

## 2021-11-27 NOTE — Progress Notes (Signed)
Internal Medicine Clinic Attending ° °I saw and evaluated the patient.  I personally confirmed the key portions of the history and exam documented by Dr. Zinoviev and I reviewed pertinent patient test results.  The assessment, diagnosis, and plan were formulated together and I agree with the documentation in the resident’s note.  °

## 2021-11-30 ENCOUNTER — Encounter: Payer: Self-pay | Admitting: Internal Medicine

## 2021-11-30 ENCOUNTER — Ambulatory Visit (INDEPENDENT_AMBULATORY_CARE_PROVIDER_SITE_OTHER): Payer: Medicare Other | Admitting: Internal Medicine

## 2021-11-30 ENCOUNTER — Other Ambulatory Visit: Payer: Self-pay

## 2021-11-30 VITALS — BP 135/57 | HR 81 | Temp 98.1°F | Ht 64.0 in | Wt 128.9 lb

## 2021-11-30 DIAGNOSIS — K623 Rectal prolapse: Secondary | ICD-10-CM

## 2021-11-30 DIAGNOSIS — I10 Essential (primary) hypertension: Secondary | ICD-10-CM

## 2021-11-30 DIAGNOSIS — U071 COVID-19: Secondary | ICD-10-CM

## 2021-11-30 DIAGNOSIS — J302 Other seasonal allergic rhinitis: Secondary | ICD-10-CM

## 2021-11-30 MED ORDER — FLUTICASONE PROPIONATE 50 MCG/ACT NA SUSP: NASAL | 2 refills | Status: DC

## 2021-11-30 MED ORDER — BENZONATATE 100 MG PO CAPS: 100.0000 mg | ORAL_CAPSULE | Freq: Three times a day (TID) | ORAL | 1 refills | Status: AC | PRN

## 2021-11-30 NOTE — Assessment & Plan Note (Signed)
Today she is concerned about potential blood loss from rectal prolapse bleeding when she was sick with COVID. She reports coughing worsens her prolapse which led her to bleed. Today she reports some pain this morning that resolved with Tylenol, but there was no pain present yesterday. Additionally, she reports leg pain that arises whenever her prolapse worsens. She feels her prolapse is okay today, but she is concerned about her continued cough making it worse. We will work to control her cough today and draw CBC to look for possible anemia. Notably, her capillary refill was delayed on physical exam. - Benzonatate 100mg  TID prn for cough - CBC

## 2021-11-30 NOTE — Assessment & Plan Note (Signed)
She reports her symptoms are gradually improving, and her cough is persistent despite slowly improving. Her main concern is that the cough exacerbates her rectal prolapse and causes pain. As her recovery continues, will try to ameliorate cough to prevent worsening of prolapse symptoms. She reports the benzonatate helped with symptoms and will plan to prescribe another course to take as needed.  - Benzonatate 100mg  TID prn

## 2021-11-30 NOTE — Progress Notes (Signed)
°  Subjective:     [name], female   DOB: 1933-02-14, 88 y.o.   MRN: [id number]  Christine [name] is an 88 y/o seen in clinic today for post-COVID follow-up. She is accompanied by her son. She tested positive for COVID on 12/21 and became sick around Christmas, and EMS was called by her daughter. She did not go with EMS to the hospital at this time. She was later found to have COVID and was seen via telemedicine, where she was prescribed medicine for her cough. She also began taking OTC Mucinex. Around this time her rectal prolapse was worsened by coughing and was accompanied by bleeding. She is concerned about blood loss and would like to check for possible anemia today. Additionally, when her prolapse worsens and causes pain her upper legs will become painful. She reports some rectal and leg pain this morning that resolved after taking Tylenol. She did not have any rectal pain yesterday.  She reports drinking fluids regularly, but has not been eating a lot of fiber since being sick. She notes fiber in her diet causes her to stool more frequently and exacerbates her pain. Currently she estimates she is stooling regularly every other day.  Finally, she inquires about obtaining authorization for diabetic test strips    Review of Systems  Respiratory:  Positive for cough.   Gastrointestinal:  Positive for rectal pain.      Objective:   Physical Exam Constitutional:      General: She is awake.     Appearance: Normal appearance. She is not ill-appearing.  HENT:     Mouth/Throat:     Lips: Pink.     Mouth: Mucous membranes are moist.     Comments: No pallor noted on mucous membranes. Cardiovascular:     Rate and Rhythm: Normal rate and regular rhythm.     Heart sounds: Murmur heard.     Comments: Systolic ejection murmur present Pulmonary:     Effort: Pulmonary effort is normal. No respiratory distress.     Breath sounds: Normal breath sounds.  Abdominal:     General: Bowel  sounds are normal. There is no distension.     Palpations: Abdomen is soft.     Tenderness: There is no abdominal tenderness.  Skin:    General: Skin is warm.     Capillary Refill: Capillary refill takes more than 3 seconds.  Neurological:     Mental Status: She is alert.       Assessment & Plan:     See problem-based charting

## 2021-11-30 NOTE — Assessment & Plan Note (Signed)
Blood pressure on arrival 150/61. On re-check 135/57 and patient brought blood pressure log from home with systolics regularly in the 120s/130s. Currently well-controlled.  - Continue amlodipine 10mg  daily - Continue valsartan 320mg  daily.

## 2021-11-30 NOTE — Assessment & Plan Note (Signed)
Requested flonase refill today.  - Flonase refilled.

## 2021-11-30 NOTE — Patient Instructions (Signed)
Thank you, Christine [name] for allowing [place] to provide your care today. Today we discussed:  Post-COVID: - We have prescribed some cough medication to reduce your cough as you continue to recover. You can also use regular cough medicine over-the-counter as needed for symptoms. Call us if your symptoms worsen or persist.  Prolapse: - We will check some bloodwork today to assess if you have anemia related to your prolapse. We will call you with the results.    I have ordered the following labs for you:  Lab Orders         CBC no Diff       I have ordered the following medication/changed the following medications:   Stop the following medications: There are no discontinued medications.   Start the following medications: Meds ordered this encounter  Medications   benzonatate (TESSALON PERLES) 100 MG capsule    Sig: Take 1 capsule (100 mg total) by mouth 3 (three) times daily as needed for cough.    Dispense:  30 capsule    Refill:  1     Follow up: 6 months   Remember: Should you have any questions or concerns please call the internal medicine clinic at [phone].

## 2021-12-01 LAB — CBC
Hematocrit: 33.3 % — ABNORMAL LOW (ref 34.0–46.6)
Hemoglobin: 10.7 g/dL — ABNORMAL LOW (ref 11.1–15.9)
MCH: 28.8 pg (ref 26.6–33.0)
MCHC: 32.1 g/dL (ref 31.5–35.7)
MCV: 90 fL (ref 79–97)
Platelets: 457 10*3/uL — ABNORMAL HIGH (ref 150–450)
RBC: 3.72 x10E6/uL — ABNORMAL LOW (ref 3.77–5.28)
RDW: 13.5 % (ref 11.7–15.4)
WBC: 11.5 10*3/uL — ABNORMAL HIGH (ref 3.4–10.8)

## 2021-12-05 NOTE — Progress Notes (Signed)
Attestation for Student Documentation:  I personally was present and performed or re-performed the history, physical exam and medical decision-making activities of this service and have verified that the service and findings are accurately documented in the students note.  [name], MD 12/05/2021, 2:54 PM

## 2021-12-12 ENCOUNTER — Other Ambulatory Visit: Payer: Self-pay | Admitting: Internal Medicine

## 2021-12-12 DIAGNOSIS — E039 Hypothyroidism, unspecified: Secondary | ICD-10-CM

## 2021-12-13 ENCOUNTER — Other Ambulatory Visit: Payer: Self-pay

## 2021-12-13 MED ORDER — FAMOTIDINE 20 MG PO TABS: 20.0000 mg | ORAL_TABLET | Freq: Every day | ORAL | 0 refills | Status: DC | PRN

## 2021-12-13 NOTE — Progress Notes (Signed)
Refill of famotidine qd prn sent to [facility]

## 2021-12-27 ENCOUNTER — Encounter: Payer: Self-pay | Admitting: Internal Medicine

## 2022-01-14 ENCOUNTER — Other Ambulatory Visit: Payer: Medicare Other

## 2022-01-18 ENCOUNTER — Ambulatory Visit: Payer: Self-pay

## 2022-01-18 ENCOUNTER — Telehealth: Payer: Medicare Other

## 2022-01-18 ENCOUNTER — Encounter: Payer: Medicare Other | Admitting: Internal Medicine

## 2022-01-18 DIAGNOSIS — I5032 Chronic diastolic (congestive) heart failure: Secondary | ICD-10-CM

## 2022-01-18 DIAGNOSIS — K623 Rectal prolapse: Secondary | ICD-10-CM

## 2022-01-18 DIAGNOSIS — E119 Type 2 diabetes mellitus without complications: Secondary | ICD-10-CM

## 2022-01-18 DIAGNOSIS — I10 Essential (primary) hypertension: Secondary | ICD-10-CM

## 2022-01-18 NOTE — Patient Instructions (Signed)
Visit Information ? ?Thank you for taking time to visit with me today. Please don't hesitate to contact me if I can be of assistance to you before our next scheduled telephone appointment. ? ?Our next appointment is by telephone on 03/06/22 at 1PM ? ?Please call the care guide team at [phone] if you need to cancel or reschedule your appointment.  ? ?If you are experiencing a Mental Health or [address] or need someone to talk to, please call the [place] National Suicide Prevention Lifeline: 510-501-3786 or TTY: 832-685-5353 TTY [phone]) to talk to a trained counselor  ? ?Patient verbalizes understanding of instructions and care plan provided today and agrees to view in [place]. Active MyChart status confirmed with patient.   ? ?The patient has been provided with contact information for the care management team and has been advised to call with any health related questions or concerns.  ? ?Christine Killian, RN, BSN, CCM ?Care Management Coordinator ?[facility] ?Phone: [phone]/JQD: [phone]  ?

## 2022-01-18 NOTE — Chronic Care Management (AMB) (Signed)
Care Management    RN Visit Note  01/18/2022 Name: Christine [name] MRN: [id number] DOB: 08-07-1933  Subjective: Christine [name] is a 88 y.o. year old female who is a primary care patient of [name], MD. The care management team was consulted for assistance with disease management and care coordination needs.    Engaged with patient by telephone for follow up visit in response to provider referral for case management and/or care coordination services.   Consent to Services:   Christine [name] was given information about Care Management services today including:  Care Management services includes personalized support from designated clinical staff supervised by her physician, including individualized plan of care and coordination with other care providers 24/7 contact phone numbers for assistance for urgent and routine care needs. The patient may stop case management services at any time by phone call to the office staff.  Patient agreed to services and consent obtained.   Assessment: Review of patient past medical history, allergies, medications, health status, including review of consultants reports, laboratory and other test data, was performed as part of comprehensive evaluation and provision of chronic care management services.   SDOH (Social Determinants of Health) assessments and interventions performed:  SDOH Interventions    Flowsheet Row Most Recent Value  SDOH Interventions   Financial Strain Interventions Intervention Not Indicated  Housing Interventions Intervention Not Indicated        Care Plan  Allergies  Allergen Reactions   Aspirin Swelling and Other (See Comments)    Angioedema   Atarax [Hydroxyzine] Other (See Comments)    psychosis   Nsaids Other (See Comments)    "interacts with heart medications"   Codeine Nausea And Vomiting and Other (See Comments)    "makes me deathly sick" - can take with nausea medicine    Haldol [Haloperidol] Other (See Comments)     EPS - rigidity and dystonic reaction, very sensitive   Other Other (See Comments)    MSG   Penicillins Diarrhea    "real bad" Has patient had a PCN reaction causing immediate rash, facial/tongue/throat swelling, SOB or lightheadedness with hypotension: Yes Has patient had a PCN reaction causing severe rash involving mucus membranes or skin necrosis: No Has patient had a PCN reaction that required hospitalization No Has patient had a PCN reaction occurring within the last 10 years: Yes If all of the above answers are "NO", then may proceed with Cephalosporin use.    Metoprolol     Chronotropic incompetence    Outpatient Encounter Medications as of 01/18/2022  Medication Sig   levothyroxine (SYNTHROID) 25 MCG tablet TAKE 1 TABLET BY MOUTH EVERY DAY   acetaminophen (TYLENOL) 500 MG tablet Take 1,000 mg by mouth every 6 (six) hours as needed for headache. For pain   acetaminophen-codeine (TYLENOL #3) 300-30 MG tablet Take 1 tablet by mouth every 6 (six) hours as needed for severe pain.   amLODipine (NORVASC) 10 MG tablet TAKE 1 TABLET(10 MG) BY MOUTH DAILY   Ascorbic Acid (VITAMIN C PO) Take 2 tablets by mouth daily.    atorvastatin (LIPITOR) 40 MG tablet TAKE 0.5 TABLETS BY MOUTH EVERY EVENING   benzonatate (TESSALON PERLES) 100 MG capsule Take 1 capsule (100 mg total) by mouth 3 (three) times daily as needed for cough.   clopidogrel (PLAVIX) 75 MG tablet TAKE 1 TABLET BY MOUTH EVERY EVENING   famotidine (PEPCID) 20 MG tablet Take 1 tablet (20 mg total) by mouth daily as needed. Please schedule  a yearly follow up for further refills.  Thank you   fluticasone (FLONASE) 50 MCG/ACT nasal spray PLACE 1 SPRAY IN EACH NOSTRIL EVERY DAY   fluticasone-salmeterol (ADVAIR DISKUS) 250-50 MCG/ACT AEPB INHALE 1 PUFF BY MOUTH EVERY 12 HOURS   glimepiride (AMARYL) 1 MG tablet take 1 tablet by mouth once daily BEFORE BREAKFAST.   glucose blood (ONETOUCH ULTRA) test strip Use once daily to check blood  sugar before breakfast.   imipramine (TOFRANIL) 50 MG tablet TAKE 1 TABLET(50 MG) BY MOUTH TWICE DAILY   Lancets (ONETOUCH DELICA PLUS [id number]) MISC TEST ONCE DAILY   Multiple Vitamin (MULTIVITAMIN WITH MINERALS) TABS tablet Take 1 tablet by mouth daily. (Patient taking differently: Take 2 tablets by mouth daily.)   ondansetron (ZOFRAN) 4 MG tablet Take 1 tablet (4 mg total) by mouth every 8 (eight) hours as needed for nausea or vomiting.   valsartan (DIOVAN) 320 MG tablet TAKE 1 TABLET(320 MG) BY MOUTH DAILY   No facility-administered encounter medications on file as of 01/18/2022.    Patient Active Problem List   Diagnosis Date Noted   COVID-19 virus infection 11/15/2021   Premature atrial contraction 05/01/2021   Chronotropic incompetence 05/01/2021   Gait instability 09/24/2020   Coronary artery disease involving native coronary artery of native heart with angina pectoris ([place]) 06/23/2020   Cervical radiculopathy at C7 06/23/2020   Chronic diastolic (congestive) heart failure ([place]) 01/13/2019   PAT (paroxysmal atrial tachycardia) ([place]) 01/13/2019   CKD stage 3 due to type 2 diabetes mellitus ([place]) 01/13/2019   Rectal prolapse 06/24/2018   Temporal arteritis ([place]) 04/06/2018   Rheumatic disease 07/29/2017   MDD (major depressive disorder), recurrent, in full remission ([place]) 11/30/2016   Elevated alkaline phosphatase level 09/18/2016   Hypertensive retinopathy of both eyes, grade 2 04/20/2015   Lumbar spondylosis 06/16/2014   Carpal tunnel syndrome 02/18/2014   Osteopenia 01/11/2013   Routine adult health maintenance 01/11/2013   Diverticulosis 09/18/2012   Allergic rhinitis 02/13/2012   HEART MURMUR, SYSTOLIC 83/15/1761   Hyperlipidemia 11/28/2006   Hypothyroidism 09/04/2006   Type 2 diabetes mellitus, controlled ([place]) 09/04/2006   Migraine 09/04/2006   Essential hypertension 09/04/2006   GERD 09/04/2006    Conditions to be addressed/monitored: CHF, HTN, and DMII  Care  Plan : RN Care Coordinator Plan of Care  Updates made by Johnney Killian, RN since 01/18/2022 12:00 AM     Problem: Health Promotion or Disease Self-Management (General Plan of Care)      Long-Range Goal: Chronic Disease Management and Care Coordination Needs (HTN, CHF, DM2)   Start Date: 01/18/2022  Priority: High  Note:   Current Barriers: Initial out reach to patient from high risk list of [facility] patients. Verified patient with 2 identifiers and obtained permission from patient to call her for care coordination.  Patient notes that she is feeling better and her cough from Covid 19 has subsided.  Patient continues to express concern about her rectal prolapse.  She has very little bleeding from the rectum.  Patient stated that her CBG reading this morning was 132 and averages between 130 and 140.  She has not had any hypoglycemic episodes, incidents of low CBG readings.  Patient was provided this RNCM direct phone number for any issues/concerns she may have and plan to follow up with her in 4-6 weeks. Knowledge Deficits related to plan of care for management of CHF, HTN, and DMII  Chronic Disease Management support and education needs related to CHF, HTN, and DMII  RNCM Clinical Goal(s):  Patient will verbalize basic understanding of  CHF, HTN, and DMII disease process and self health management plan as evidenced by discussions with provider and RNCM. demonstrate Ongoing health management independence as evidenced by A1C below 8 continue to work with RN Care Manager to address care management and care coordination needs related to  CHF, HTN, and DMII as evidenced by adherence to CM Team Scheduled appointments through collaboration with RN Care manager, provider, and care team.  Interventions: 1:1 collaboration with primary care provider regarding development and update of comprehensive plan of care as evidenced by provider attestation and co-signature Inter-disciplinary care team collaboration (see  longitudinal plan of care) Evaluation of current treatment plan related to  self management and patient's adherence to plan as established by provider Heart Failure Interventions:  (Status:  New goal.) Long Term Goal Assessed need for readable accurate scales in home Discussed the importance of keeping all appointments with provider Diabetes Interventions:  (Status:  Goal on track:  NO.) Long Term Goal Assessed patient's understanding of A1c goal: <7% Reviewed medications with patient and discussed importance of medication adherence Counseled on importance of regular laboratory monitoring as prescribed Discussed plans with patient for ongoing care management follow up and provided patient with direct contact information for care management team Review of patient status, including review of consultants reports, relevant laboratory and other test results, and medications completed Assessed social determinant of health barriers Lab Results  Component Value Date   HGBA1C 7.5 (A) 11/23/2021   Hypertension Interventions:  (Status:  Goal on track:  Yes.) Long Term Goal Last practice recorded BP readings:  BP Readings from Last 3 Encounters:  11/30/21 (!) 135/57  11/23/21 133/64  10/19/21 (!) 160/60  Most recent eGFR/CrCl:  Lab Results  Component Value Date   EGFR 47 (L) 10/19/2021    No components found for: CRCL  Reviewed medications with patient and discussed importance of compliance Discussed plans with patient for ongoing care management follow up and provided patient with direct contact information for care management team Reviewed scheduled/upcoming provider appointments including:  Assessed social determinant of health barriers  Patient Goals/Self-Care Activities: Take all medications as prescribed Attend all scheduled provider appointments Call pharmacy for medication refills 3-7 days in advance of running out of medications Perform all self care activities independently  Call  provider office for new concerns or questions  keep legs up while sitting track weight in diary use salt in moderation follow rescue plan if symptoms flare-up check feet daily for cuts, sores or redness enter blood sugar readings and medication or insulin into daily log trim toenails straight across wash and dry feet carefully every day wear comfortable, cotton socks wear comfortable, well-fitting shoes learn about high blood pressure take medications for blood pressure exactly as prescribed  Follow Up Plan:  The patient has been provided with contact information for the care management team and has been advised to call with any health related questions or concerns.    Evidence-based guidance:  Review biopsychosocial determinants of health screens.  Determine level of modifiable health risk.  Assess level of patient activation, level of readiness, importance and confidence to make changes.  Evoke change talk using open-ended questions, pros and cons, as well as looking forward.  Identify areas where behavior change may lead to improved health.  Partner with patient to develop a robust self-management plan that includes lifestyle factors, such as weight loss, exercise and healthy nutrition, as well as goals specific to disease  risks.  Support patient and family/caregiver active participation in decision-making and self-management plan.  Implement additional goals and interventions based on identified risk factors to reduce health risk.  Facilitate advance care planning.  Review need for preventive screening based on age, sex, family history and health history.   Notes:     Plan: The patient has been provided with contact information for the care management team and has been advised to call with any health related questions or concerns.   Johnney Killian, RN, BSN, CCM Care Management Coordinator [facility] Phone: [phone]: [phone]

## 2022-01-28 ENCOUNTER — Ambulatory Visit: Payer: Self-pay

## 2022-01-28 NOTE — Chronic Care Management (AMB) (Signed)
? ?  01/28/2022 ? ?Christine [name] ?September 01, 1933 ?[id number] ? ?Received incoming call from patient requesting refills for her Amaryl '1mg'$  and Famotidine '20mg'$ .  Patient verified she continues to use [employer].  Message sent to [facility] team to request refills.  Pending response. ?Johnney Killian, RN, BSN, CCM ?Care Management Coordinator ?[facility] ?Phone: [phone]/ENM: [phone]  ? ?

## 2022-01-29 ENCOUNTER — Other Ambulatory Visit: Payer: Self-pay | Admitting: Student

## 2022-01-29 DIAGNOSIS — E119 Type 2 diabetes mellitus without complications: Secondary | ICD-10-CM

## 2022-01-29 DIAGNOSIS — K219 Gastro-esophageal reflux disease without esophagitis: Secondary | ICD-10-CM

## 2022-01-29 MED ORDER — GLIMEPIRIDE 1 MG PO TABS: ORAL_TABLET | ORAL | 3 refills | Status: DC

## 2022-01-29 MED ORDER — FAMOTIDINE 20 MG PO TABS: 20.0000 mg | ORAL_TABLET | Freq: Every day | ORAL | 0 refills | Status: DC | PRN

## 2022-02-11 ENCOUNTER — Other Ambulatory Visit: Payer: Self-pay | Admitting: Internal Medicine

## 2022-02-11 ENCOUNTER — Telehealth: Payer: Self-pay

## 2022-02-11 DIAGNOSIS — I10 Essential (primary) hypertension: Secondary | ICD-10-CM

## 2022-02-11 MED ORDER — AMLODIPINE BESYLATE 10 MG PO TABS: 10.0000 mg | ORAL_TABLET | Freq: Every day | ORAL | 3 refills | Status: DC

## 2022-02-11 NOTE — Telephone Encounter (Signed)
?  Chronic Care Management  ? ?Note ? ?02/11/2022 ?Name: Christine [name] MRN: [id number] DOB: 1933/01/10 ? ?Received call from patient requesting refill of her Norvasc '10mg'$  tablet.  Patient last prescription was on 03/15/21 with 3 refills.  Message sent to [facility] and Dr. Daryll Drown for review.  Confirmed patient uses [facility]. ? ?Johnney Killian, RN, BSN, CCM ?Care Management Coordinator ?[facility] ?Phone: [phone]/PHQ: [phone]   ?

## 2022-02-12 ENCOUNTER — Other Ambulatory Visit: Payer: Self-pay | Admitting: Internal Medicine

## 2022-02-12 DIAGNOSIS — E785 Hyperlipidemia, unspecified: Secondary | ICD-10-CM

## 2022-02-28 ENCOUNTER — Other Ambulatory Visit: Payer: Self-pay | Admitting: Internal Medicine

## 2022-02-28 DIAGNOSIS — J302 Other seasonal allergic rhinitis: Secondary | ICD-10-CM

## 2022-03-04 DIAGNOSIS — Z20822 Contact with and (suspected) exposure to covid-19: Secondary | ICD-10-CM | POA: Diagnosis not present

## 2022-03-06 ENCOUNTER — Ambulatory Visit: Payer: Medicare Other

## 2022-03-06 NOTE — Patient Instructions (Signed)
Visit Information ? ?Thank you for taking time to visit with me today. Please don't hesitate to contact me if I can be of assistance to you before our next scheduled telephone appointment. ? ?Our next appointment is by telephone on 04/10/22 at 1:00 PM. ? ?Please call the care guide team at [phone] if you need to cancel or reschedule your appointment.  ? ?If you are experiencing a Mental Health or [place] or need someone to talk to, please call the [place] National Suicide Prevention Lifeline: 905 244 7518 or TTY: 531-474-2628 TTY [phone]) to talk to a trained counselor  ? ?Patient verbalizes understanding of instructions and care plan provided today and agrees to view in [place]. Active MyChart status confirmed with patient.   ? ?The patient has been provided with contact information for the care management team and has been advised to call with any health related questions or concerns.  ? ?Johnney Killian, RN, BSN, CCM ?Care Management Coordinator ?[facility] ?Phone: [phone]/HDQ: [phone]  ?

## 2022-03-06 NOTE — Chronic Care Management (AMB) (Signed)
Care Management    RN Visit Note  03/06/2022 Name: Christine [name] MRN: [id number] DOB: Mar 16, 1933  Subjective: Christine [name] is a 88 y.o. year old female who is a primary care patient of Christine Catalina, MD. The care management team was consulted for assistance with disease management and care coordination needs.    Engaged with patient by telephone for follow up visit in response to provider referral for case management and/or care coordination services.   Consent to Services:   Christine [name] was given information about Care Management services today including:  Care Management services includes personalized support from designated clinical staff supervised by her physician, including individualized plan of care and coordination with other care providers 24/7 contact phone numbers for assistance for urgent and routine care needs. The patient may stop case management services at any time by phone call to the office staff.  Patient agreed to services and consent obtained.   Assessment: Review of patient past medical history, allergies, medications, health status, including review of consultants reports, laboratory and other test data, was performed as part of comprehensive evaluation and provision of chronic care management services.   SDOH (Social Determinants of Health) assessments and interventions performed:    Care Plan  Allergies  Allergen Reactions   Aspirin Swelling and Other (See Comments)    Angioedema   Atarax [Hydroxyzine] Other (See Comments)    psychosis   Nsaids Other (See Comments)    "interacts with heart medications"   Codeine Nausea And Vomiting and Other (See Comments)    "makes me deathly sick" - can take with nausea medicine    Haldol [Haloperidol] Other (See Comments)    EPS - rigidity and dystonic reaction, very sensitive   Other Other (See Comments)    MSG   Penicillins Diarrhea    "real bad" Has patient had a PCN reaction causing immediate rash,  facial/tongue/throat swelling, SOB or lightheadedness with hypotension: Yes Has patient had a PCN reaction causing severe rash involving mucus membranes or skin necrosis: No Has patient had a PCN reaction that required hospitalization No Has patient had a PCN reaction occurring within the last 10 years: Yes If all of the above answers are "NO", then may proceed with Cephalosporin use.    Metoprolol     Chronotropic incompetence    Outpatient Encounter Medications as of 03/06/2022  Medication Sig   acetaminophen (TYLENOL) 500 MG tablet Take 1,000 mg by mouth every 6 (six) hours as needed for headache. For pain   acetaminophen-codeine (TYLENOL #3) 300-30 MG tablet Take 1 tablet by mouth every 6 (six) hours as needed for severe pain.   ADVAIR DISKUS 250-50 MCG/ACT AEPB INHALE 1 PUFF BY MOUTH EVERY 12 HOURS   amLODipine (NORVASC) 10 MG tablet Take 1 tablet (10 mg total) by mouth daily.   Ascorbic Acid (VITAMIN C PO) Take 2 tablets by mouth daily.    atorvastatin (LIPITOR) 40 MG tablet TAKE 1/2 TABLET BY MOUTH EVERY EVENING   benzonatate (TESSALON PERLES) 100 MG capsule Take 1 capsule (100 mg total) by mouth 3 (three) times daily as needed for cough.   clopidogrel (PLAVIX) 75 MG tablet TAKE 1 TABLET BY MOUTH EVERY EVENING   famotidine (PEPCID) 20 MG tablet Take 1 tablet (20 mg total) by mouth daily as needed. Please schedule a yearly follow up for further refills.  Thank you   fluticasone (FLONASE) 50 MCG/ACT nasal spray PLACE 1 SPRAY IN EACH NOSTRIL EVERY DAY   glimepiride (  AMARYL) 1 MG tablet take 1 tablet by mouth once daily BEFORE BREAKFAST.   glucose blood (ONETOUCH ULTRA) test strip Use once daily to check blood sugar before breakfast.   imipramine (TOFRANIL) 50 MG tablet TAKE 1 TABLET(50 MG) BY MOUTH TWICE DAILY   Lancets (ONETOUCH DELICA PLUS LANCET30G) MISC TEST ONCE DAILY   levothyroxine (SYNTHROID) 25 MCG tablet TAKE 1 TABLET BY MOUTH EVERY DAY   Multiple Vitamin (MULTIVITAMIN WITH  MINERALS) TABS tablet Take 1 tablet by mouth daily. (Patient taking differently: Take 2 tablets by mouth daily.)   ondansetron (ZOFRAN) 4 MG tablet Take 1 tablet (4 mg total) by mouth every 8 (eight) hours as needed for nausea or vomiting.   valsartan (DIOVAN) 320 MG tablet TAKE 1 TABLET(320 MG) BY MOUTH DAILY   No facility-administered encounter medications on file as of 03/06/2022.    Patient Active Problem List   Diagnosis Date Noted   COVID-19 virus infection 11/15/2021   Premature atrial contraction 05/01/2021   Chronotropic incompetence 05/01/2021   Gait instability 09/24/2020   Coronary artery disease involving native coronary artery of native heart with angina pectoris (HCC) 06/23/2020   Cervical radiculopathy at C7 06/23/2020   Chronic diastolic (congestive) heart failure (HCC) 01/13/2019   PAT (paroxysmal atrial tachycardia) (HCC) 01/13/2019   CKD stage 3 due to type 2 diabetes mellitus (HCC) 01/13/2019   Rectal prolapse 06/24/2018   Temporal arteritis (HCC) 04/06/2018   Rheumatic disease 07/29/2017   MDD (major depressive disorder), recurrent, in full remission (HCC) 11/30/2016   Elevated alkaline phosphatase level 09/18/2016   Hypertensive retinopathy of both eyes, grade 2 04/20/2015   Lumbar spondylosis 06/16/2014   Carpal tunnel syndrome 02/18/2014   Osteopenia 01/11/2013   Routine adult health maintenance 01/11/2013   Diverticulosis 09/18/2012   Allergic rhinitis 02/13/2012   HEART MURMUR, SYSTOLIC 10/31/2010   Hyperlipidemia 11/28/2006   Hypothyroidism 09/04/2006   Type 2 diabetes mellitus, controlled (HCC) 09/04/2006   Migraine 09/04/2006   Essential hypertension 09/04/2006   GERD 09/04/2006    Conditions to be addressed/monitored: CHF, HTN, and DMII  Care Plan : RN Care Coordinator Plan of Care  Updates made by Christine Gross, RN since 03/06/2022 12:00 AM     Problem: Health Promotion or Disease Self-Management (General Plan of Care)      Long-Range  Goal: Chronic Disease Management and Care Coordination Needs (HTN, CHF, DM2)   Start Date: 01/18/2022  Priority: High  Note:   Current Barriers: Successful outreach to patient this afternoon.  She is doing well, she provided me her CBG reading for the past several days; 4/11-131 fasting in am: 4/12-146 in evening: 4/13-110 fasting am: 4/14- 95 in am and 128 PM: 4/15-106 fasting: 4/16-117  fasting am: 4-17-134 PM; 4/18-126@8 :55am. Patient is also concerned that her blood pressure is running lower than it has in the past-the readings she provided were 101/56 am, 141/55 PM reading; 111/52 0930, 141/55.  Patient notes she has not had any symptoms from low blood pressure and none of the readings appear excessively low.  Advised patient I would send a copy of the note to her PCP, Dr. Criselda Peaches and if she has any concerns I will get back to her. Knowledge Deficits related to plan of care for management of CHF, HTN, and DMII  Chronic Disease Management support and education needs related to CHF, HTN, and DMII  RNCM Clinical Goal(s):  Patient will verbalize basic understanding of  CHF, HTN, and DMII disease process and self health management plan  as evidenced by discussions with provider and RNCM. demonstrate Ongoing health management independence as evidenced by A1C below 8 continue to work with RN Care Manager to address care management and care coordination needs related to  CHF, HTN, and DMII as evidenced by adherence to CM Team Scheduled appointments through collaboration with RN Care manager, provider, and care team.  Interventions: 1:1 collaboration with primary care provider regarding development and update of comprehensive plan of care as evidenced by provider attestation and co-signature Inter-disciplinary care team collaboration (see longitudinal plan of care) Evaluation of current treatment plan related to  self management and patient's adherence to plan as established by provider Heart Failure  Interventions:  (Status:  New goal.) Long Term Goal Assessed need for readable accurate scales in home Discussed the importance of keeping all appointments with provider Diabetes Interventions:  (Status:  Goal on track:  NO.) Long Term Goal Assessed patient's understanding of A1c goal: <7% Reviewed medications with patient and discussed importance of medication adherence Counseled on importance of regular laboratory monitoring as prescribed Discussed plans with patient for ongoing care management follow up and provided patient with direct contact information for care management team Review of patient status, including review of consultants reports, relevant laboratory and other test results, and medications completed Assessed social determinant of health barriers Lab Results  Component Value Date   HGBA1C 7.5 (A) 11/23/2021   Hypertension Interventions:  (Status:  Goal on track:  Yes.) Long Term Goal Last practice recorded BP readings:  BP Readings from Last 3 Encounters:  11/30/21 (!) 135/57  11/23/21 133/64  10/19/21 (!) 160/60  Most recent eGFR/CrCl:  Lab Results  Component Value Date   EGFR 47 (L) 10/19/2021    No components found for: CRCL  Reviewed medications with patient and discussed importance of compliance Discussed plans with patient for ongoing care management follow up and provided patient with direct contact information for care management team Reviewed scheduled/upcoming provider appointments including:  Assessed social determinant of health barriers  Patient Goals/Self-Care Activities: Take all medications as prescribed Attend all scheduled provider appointments Call pharmacy for medication refills 3-7 days in advance of running out of medications Perform all self care activities independently  Call provider office for new concerns or questions  keep legs up while sitting track weight in diary use salt in moderation follow rescue plan if symptoms  flare-up check feet daily for cuts, sores or redness enter blood sugar readings and medication or insulin into daily log trim toenails straight across wash and dry feet carefully every day wear comfortable, cotton socks wear comfortable, well-fitting shoes learn about high blood pressure take medications for blood pressure exactly as prescribed  Follow Up Plan:  The patient has been provided with contact information for the care management team and has been advised to call with any health related questions or concerns.         Plan: The patient has been provided with contact information for the care management team and has been advised to call with any health related questions or concerns.  Christine Gross, RN, BSN, CCM Care Management Coordinator [facility] Phone: [phone]/Fax: [phone]

## 2022-03-12 ENCOUNTER — Telehealth: Payer: Self-pay

## 2022-03-12 DIAGNOSIS — M47816 Spondylosis without myelopathy or radiculopathy, lumbar region: Secondary | ICD-10-CM

## 2022-03-12 NOTE — Telephone Encounter (Signed)
Incoming call from patient requesting a refill on the Tylenol with Codeine (300-30) which was prescribed to her on 07/20/21.  Patient notes her back is hurting her and she got good relief from this medication in the past. ?

## 2022-03-12 NOTE — Telephone Encounter (Signed)
error 

## 2022-03-14 ENCOUNTER — Telehealth: Payer: Self-pay

## 2022-03-14 MED ORDER — ACETAMINOPHEN-CODEINE #3 300-30 MG PO TABS: 1.0000 | ORAL_TABLET | Freq: Four times a day (QID) | ORAL | 0 refills | Status: DC | PRN

## 2022-03-14 NOTE — Telephone Encounter (Signed)
Refilled

## 2022-03-14 NOTE — Telephone Encounter (Signed)
?  Chronic Care Management  ? ?Note ? ?03/14/2022 ?Name: Christine [name] MRN: [id number] DOB: Dec 29, 1932 ? ?Successful outreach to patient to notify her the refill request for Tylenol w/Codeine was sent in to [facility].  Requested she notify clinic if her symptoms do not improve. ?Johnney Killian, RN, BSN, CCM ?Care Management Coordinator ?[facility] ?Phone: [phone]/QJF: [phone]  ? ? ?

## 2022-03-16 DIAGNOSIS — Z20822 Contact with and (suspected) exposure to covid-19: Secondary | ICD-10-CM | POA: Diagnosis not present

## 2022-03-19 DIAGNOSIS — Z20822 Contact with and (suspected) exposure to covid-19: Secondary | ICD-10-CM | POA: Diagnosis not present

## 2022-04-08 ENCOUNTER — Encounter: Payer: Self-pay | Admitting: *Deleted

## 2022-04-10 ENCOUNTER — Telehealth: Payer: Medicare Other

## 2022-04-29 ENCOUNTER — Ambulatory Visit (INDEPENDENT_AMBULATORY_CARE_PROVIDER_SITE_OTHER): Payer: Medicare Other | Admitting: Internal Medicine

## 2022-04-29 ENCOUNTER — Other Ambulatory Visit: Payer: Self-pay

## 2022-04-29 VITALS — BP 131/60 | HR 85 | Temp 98.2°F | Ht 64.0 in | Wt 131.1 lb

## 2022-04-29 DIAGNOSIS — I25119 Atherosclerotic heart disease of native coronary artery with unspecified angina pectoris: Secondary | ICD-10-CM

## 2022-04-29 DIAGNOSIS — N183 Chronic kidney disease, stage 3 unspecified: Secondary | ICD-10-CM | POA: Diagnosis not present

## 2022-04-29 DIAGNOSIS — E1122 Type 2 diabetes mellitus with diabetic chronic kidney disease: Secondary | ICD-10-CM

## 2022-04-29 DIAGNOSIS — Z7984 Long term (current) use of oral hypoglycemic drugs: Secondary | ICD-10-CM | POA: Diagnosis not present

## 2022-04-29 DIAGNOSIS — E039 Hypothyroidism, unspecified: Secondary | ICD-10-CM

## 2022-04-29 DIAGNOSIS — Z23 Encounter for immunization: Secondary | ICD-10-CM | POA: Diagnosis not present

## 2022-04-29 DIAGNOSIS — E11319 Type 2 diabetes mellitus with unspecified diabetic retinopathy without macular edema: Secondary | ICD-10-CM

## 2022-04-29 DIAGNOSIS — M316 Other giant cell arteritis: Secondary | ICD-10-CM | POA: Diagnosis not present

## 2022-04-29 DIAGNOSIS — E119 Type 2 diabetes mellitus without complications: Secondary | ICD-10-CM

## 2022-04-29 DIAGNOSIS — I10 Essential (primary) hypertension: Secondary | ICD-10-CM | POA: Diagnosis not present

## 2022-04-29 DIAGNOSIS — E785 Hyperlipidemia, unspecified: Secondary | ICD-10-CM | POA: Diagnosis not present

## 2022-04-29 DIAGNOSIS — I4719 Other supraventricular tachycardia: Secondary | ICD-10-CM

## 2022-04-29 DIAGNOSIS — Z0000 Encounter for general adult medical examination without abnormal findings: Secondary | ICD-10-CM

## 2022-04-29 DIAGNOSIS — I491 Atrial premature depolarization: Secondary | ICD-10-CM

## 2022-04-29 DIAGNOSIS — I129 Hypertensive chronic kidney disease with stage 1 through stage 4 chronic kidney disease, or unspecified chronic kidney disease: Secondary | ICD-10-CM | POA: Diagnosis not present

## 2022-04-29 DIAGNOSIS — G43809 Other migraine, not intractable, without status migrainosus: Secondary | ICD-10-CM

## 2022-04-29 DIAGNOSIS — H35033 Hypertensive retinopathy, bilateral: Secondary | ICD-10-CM | POA: Diagnosis not present

## 2022-04-29 DIAGNOSIS — I471 Supraventricular tachycardia: Secondary | ICD-10-CM

## 2022-04-29 LAB — POCT GLYCOSYLATED HEMOGLOBIN (HGB A1C): Hemoglobin A1C: 7.2 % — AB (ref 4.0–5.6)

## 2022-04-29 LAB — GLUCOSE, CAPILLARY: Glucose-Capillary: 103 mg/dL — ABNORMAL HIGH (ref 70–99)

## 2022-04-29 NOTE — Progress Notes (Signed)
Established Patient Office Visit  Subjective   [name], female    DOB: [birth year]  Age: 88 y.o. MRN: [id number]  Chief Complaint  Patient presents with   check-up Visit    Dizziness sometimes even when sitting     Christine [name] presents for a 5 month follow up visit.  She reports doing well.  She brought in her blood sugar and blood pressure log for me to review.  She has labile blood pressures, but has not tolerated increased blood pressure doses in the past.  She notes occasional pains, including headache, ear pain and temporal pain on the right side.  She has a history of temporal arteritis and PMR for which she has left over prednisone.  She has taken this twice and the pain immediately resolved.   She also noted some episodes of dizziness.  She notes feeling suddenly lightheaded.  She has no focal weakness or LOC.  She checks her BS and BP and they have not been abnormal.  On exam today, the symptoms were somewhat reproducible with eye movements.  She denies chest pain, SOB, swelling in the legs.  She did have 1 fall in April where she tripped over a cord that she did not know was there.   She has been taking her medications without issue.     Review of Systems  Constitutional:  Negative for chills, diaphoresis, fever, malaise/fatigue and weight loss.  HENT:  Positive for ear pain (one time, resolved) and hearing loss. Negative for tinnitus.   Eyes:  Negative for blurred vision, double vision and photophobia.  Respiratory:  Negative for cough and shortness of breath.   Cardiovascular:  Negative for chest pain, palpitations, orthopnea, claudication and leg swelling.  Musculoskeletal:  Positive for joint pain (occasional). Negative for back pain, myalgias and neck pain.  Neurological:  Positive for dizziness (see HPI) and headaches (occasional). Negative for sensory change, loss of consciousness and weakness.      Objective:     BP 131/60 (BP Location: Right Arm,  Patient Position: Sitting, Cuff Size: Normal)   Pulse 85   Temp 98.2 F (36.8 C) (Oral)   Ht 5' 4"  (1.626 m)   Wt 131 lb 1.6 oz (59.5 kg)   LMP 02/11/1975 (Approximate)   SpO2 98% Comment: RA  BMI 22.50 kg/m    Physical Exam Vitals and nursing note reviewed.  Constitutional:      General: She is not in acute distress.    Appearance: Normal appearance. She is normal weight. She is not toxic-appearing.  HENT:     Head: Normocephalic and atraumatic.  Eyes:     General: No scleral icterus.       Right eye: No discharge.        Left eye: No discharge.     Extraocular Movements: Extraocular movements intact.  Cardiovascular:     Rate and Rhythm: Normal rate and regular rhythm.     Pulses: Normal pulses.     Heart sounds: No murmur heard. Pulmonary:     Effort: Pulmonary effort is normal. No respiratory distress.     Breath sounds: Normal breath sounds. No wheezing.  Abdominal:     General: Abdomen is flat.     Palpations: Abdomen is soft.  Musculoskeletal:        General: No swelling or tenderness.     Right lower leg: No edema.     Left lower leg: No edema.  Skin:    General:  Skin is warm and dry.  Neurological:     General: No focal deficit present.     Mental Status: She is alert and oriented to person, place, and time. Mental status is at baseline.     Motor: No weakness.  Psychiatric:        Mood and Affect: Mood normal.        Behavior: Behavior normal.    BMET, TSH, CBC, A1C, lipid, ESR today  Results for orders placed or performed in visit on 04/29/22  Glucose, capillary  Result Value Ref Range   Glucose-Capillary 103 (H) 70 - 99 mg/dL  BMP8+Anion Gap  Result Value Ref Range   Glucose 119 (H) 70 - 99 mg/dL   BUN 21 8 - 27 mg/dL   Creatinine, Ser 0.99 0.57 - 1.00 mg/dL   eGFR 55 (L) >59 mL/min/1.73   BUN/Creatinine Ratio 21 12 - 28   Sodium 138 134 - 144 mmol/L   Potassium 4.3 3.5 - 5.2 mmol/L   Chloride 102 96 - 106 mmol/L   CO2 21 20 - 29 mmol/L    Anion Gap 15.0 10.0 - 18.0 mmol/L   Calcium 9.5 8.7 - 10.3 mg/dL  CBC no Diff  Result Value Ref Range   WBC 5.9 3.4 - 10.8 x10E3/uL   RBC 3.81 3.77 - 5.28 x10E6/uL   Hemoglobin 10.9 (L) 11.1 - 15.9 g/dL   Hematocrit 33.2 (L) 34.0 - 46.6 %   MCV 87 79 - 97 fL   MCH 28.6 26.6 - 33.0 pg   MCHC 32.8 31.5 - 35.7 g/dL   RDW 13.8 11.7 - 15.4 %   Platelets 334 150 - 450 x10E3/uL  Lipid Profile  Result Value Ref Range   Cholesterol, Total 130 100 - 199 mg/dL   Triglycerides 156 (H) 0 - 149 mg/dL   HDL 42 >39 mg/dL   VLDL Cholesterol Cal 27 5 - 40 mg/dL   LDL Chol Calc (NIH) 61 0 - 99 mg/dL   Chol/HDL Ratio 3.1 0.0 - 4.4 ratio  TSH  Result Value Ref Range   TSH 2.210 0.450 - 4.500 uIU/mL  Sed Rate (ESR)  Result Value Ref Range   Sed Rate 35 0 - 40 mm/hr  POC Hbg A1C  Result Value Ref Range   Hemoglobin A1C 7.2 (A) 4.0 - 5.6 %   HbA1c POC (<> result, manual entry)     HbA1c, POC (prediabetic range)     HbA1c, POC (controlled diabetic range)         Assessment & Plan:   Problem List Items Addressed This Visit       Unprioritized   Hypothyroidism (Chronic)    We will plan to check TSH today for follow up.   Plan Continue synthroid.       Relevant Orders   TSH (Completed)   Type 2 diabetes mellitus, controlled ([place]) - Primary (Chronic)    A1C today is well controlled.  She will continue glimepiride. Foot exam performed today.  Pulses 2+ in DP bilaterally.       Relevant Orders   POC Hbg A1C (Completed)   Hyperlipidemia (Chronic)    LDL is 46 at last check, will recheck lipids today.        Relevant Orders   Lipid Profile (Completed)   Migraine (Chronic)    She has been treated for many years with imipramine and did not tolerate sudden cessation of this medication.  She has also been treated in the past  for temporal arteritis.  She reports taking prednisone for a headache since I have last seen her.   Plan Continue imipramine.       Essential hypertension  (Chronic)    BP has been labile in the past.  She brings in her blood pressure logs and her systolic ranges from the 44W - 150s.  She has lower diastolic pressures.  She has not tolerated further titration of her blood pressure medications in the past.  She reports occasional lightheadedness, dizziness which sometimes comes on suddenly, sometimes when she is walking.  She has not lost consciousness.  We discussed orthostasis and vertigo and how to respond to this issue in the future.  She notes worsening of symptoms today on exam with moving of her eyes.  She has no focal weakness.       Relevant Orders   BMP8+Anion Gap (Completed)   Hypertensive retinopathy of both eyes, grade 2 (Chronic)    She is following with [facility].  She has had her visit this last year.  We will try to get records.       Temporal arteritis (HCC) (Chronic)    Ms. Wernli reports intermittent headaches, eye pain, muscle pain and temporal pain.  She has taken extra prednisone 8m twice in the past few months which completely resolved the pain.  She notes no persistent issues, no fatigue.    Plan Check ESR Continue to monitor for recurrence      Relevant Orders   Sed Rate (ESR) (Completed)   PAT (paroxysmal atrial tachycardia) (HCC) (Chronic)    Currently without any issues with tachycardia.  Pulses was well controlled today.  She has not tolerated beta blocker in the past due to chronotropic intolerance.   Continue to monitor.       CKD stage 3 due to type 2 diabetes mellitus (HCC) (Chronic)    Baseline Cr around 1, this is stable today. Continue to monitor.       Relevant Orders   BMP8+Anion Gap (Completed)   CBC no Diff (Completed)   Routine adult health maintenance   Coronary artery disease involving native coronary artery of native heart with angina pectoris ([place]    Denies chest pain or any other symptoms today.   Continue good blood pressure and DM control.  Continue follow up with Cardiology.   Continue plavix.       Premature atrial contraction    Currently without any issues with tachycardia.  Pulses was well controlled today.  She has not tolerated beta blocker in the past due to chronotropic intolerance.   Continue to monitor.       Other Visit Diagnoses     Need for prophylactic vaccination against Streptococcus pneumoniae (pneumococcus)       Relevant Orders   Pneumococcal conjugate vaccine 20-valent (Prevnar 20) (Completed)   Atherosclerosis of native coronary artery of native heart with angina pectoris ([place]   (Chronic)         Return in about 3 months (around 07/30/2022).    EGilles Chiquito MD

## 2022-04-29 NOTE — Patient Instructions (Signed)
Christine [name] - -  Thank you for coming in to see me today.   Please keep taking all of your medications as you are.   For your dizziness, please keep track of when these happen, if they are getting worse or are associated with passing out, please call the clinic immediately.    We will update your blood work today.  I will call you with any abnormal results.   Thank you!  Come back to see me in 3-4 months.

## 2022-04-30 ENCOUNTER — Encounter: Payer: Self-pay | Admitting: Dietician

## 2022-04-30 LAB — BMP8+ANION GAP
Anion Gap: 15 mmol/L (ref 10.0–18.0)
BUN/Creatinine Ratio: 21 (ref 12–28)
BUN: 21 mg/dL (ref 8–27)
CO2: 21 mmol/L (ref 20–29)
Calcium: 9.5 mg/dL (ref 8.7–10.3)
Chloride: 102 mmol/L (ref 96–106)
Creatinine, Ser: 0.99 mg/dL (ref 0.57–1.00)
Glucose: 119 mg/dL — ABNORMAL HIGH (ref 70–99)
Potassium: 4.3 mmol/L (ref 3.5–5.2)
Sodium: 138 mmol/L (ref 134–144)
eGFR: 55 mL/min/{1.73_m2} — ABNORMAL LOW (ref >59)

## 2022-04-30 LAB — CBC
Hematocrit: 33.2 % — ABNORMAL LOW (ref 34.0–46.6)
Hemoglobin: 10.9 g/dL — ABNORMAL LOW (ref 11.1–15.9)
MCH: 28.6 pg (ref 26.6–33.0)
MCHC: 32.8 g/dL (ref 31.5–35.7)
MCV: 87 fL (ref 79–97)
Platelets: 334 10*3/uL (ref 150–450)
RBC: 3.81 x10E6/uL (ref 3.77–5.28)
RDW: 13.8 % (ref 11.7–15.4)
WBC: 5.9 10*3/uL (ref 3.4–10.8)

## 2022-04-30 LAB — LIPID PANEL
Chol/HDL Ratio: 3.1 ratio (ref 0.0–4.4)
Cholesterol, Total: 130 mg/dL (ref 100–199)
HDL: 42 mg/dL (ref >39)
LDL Chol Calc (NIH): 61 mg/dL (ref 0–99)
Triglycerides: 156 mg/dL — ABNORMAL HIGH (ref 0–149)
VLDL Cholesterol Cal: 27 mg/dL (ref 5–40)

## 2022-04-30 LAB — TSH: TSH: 2.21 u[IU]/mL (ref 0.450–4.500)

## 2022-04-30 LAB — SEDIMENTATION RATE: Sed Rate: 35 mm/hr (ref 0–40)

## 2022-04-30 NOTE — Assessment & Plan Note (Signed)
Christine [name] reports intermittent headaches, eye pain, muscle pain and temporal pain.  She has taken extra prednisone 34m twice in the past few months which completely resolved the pain.  She notes no persistent issues, no fatigue.    Plan Check ESR Continue to monitor for recurrence

## 2022-04-30 NOTE — Assessment & Plan Note (Signed)
Currently without any issues with tachycardia.  Pulses was well controlled today.  She has not tolerated beta blocker in the past due to chronotropic intolerance.   Continue to monitor.

## 2022-04-30 NOTE — Assessment & Plan Note (Signed)
She is following with [facility].  She has had her visit this last year.  We will try to get records.

## 2022-04-30 NOTE — Assessment & Plan Note (Signed)
She has been treated for many years with imipramine and did not tolerate sudden cessation of this medication.  She has also been treated in the past for temporal arteritis.  She reports taking prednisone for a headache since I have last seen her.   Plan Continue imipramine.

## 2022-04-30 NOTE — Assessment & Plan Note (Signed)
LDL is 46 at last check, will recheck lipids today.

## 2022-04-30 NOTE — Assessment & Plan Note (Addendum)
BP has been labile in the past.  She brings in her blood pressure logs and her systolic ranges from the 94R - 150s.  She has lower diastolic pressures.  She has not tolerated further titration of her blood pressure medications in the past.  She reports occasional lightheadedness, dizziness which sometimes comes on suddenly, sometimes when she is walking.  She has not lost consciousness.  We discussed orthostasis and vertigo and how to respond to this issue in the future.  She notes worsening of symptoms today on exam with moving of her eyes.  She has no focal weakness.

## 2022-04-30 NOTE — Assessment & Plan Note (Addendum)
Denies chest pain or any other symptoms today.   Continue good blood pressure and DM control.  Continue follow up with Cardiology.  Continue plavix.

## 2022-04-30 NOTE — Assessment & Plan Note (Signed)
We will plan to check TSH today for follow up.   Plan Continue synthroid.

## 2022-04-30 NOTE — Assessment & Plan Note (Addendum)
A1C today is well controlled.  She will continue glimepiride. Foot exam performed today.  Pulses 2+ in DP bilaterally.

## 2022-04-30 NOTE — Assessment & Plan Note (Signed)
Baseline Cr around 1, this is stable today. Continue to monitor.

## 2022-05-01 ENCOUNTER — Encounter: Payer: Medicare Other | Admitting: Internal Medicine

## 2022-05-02 ENCOUNTER — Telehealth: Payer: Self-pay

## 2022-05-02 ENCOUNTER — Telehealth: Payer: Medicare Other

## 2022-05-02 NOTE — Telephone Encounter (Signed)
  Care Management   Outreach Note  05/02/2022 Name: Christine [name] MRN: [id number] DOB: 1933/03/16  Referred by: [name], MD Reason for referral : Chronic Care Management   An unsuccessful telephone outreach was attempted today. The patient was referred to the case management team for assistance with care management and care coordination.   Follow Up Plan: The care management team will reach out to the patient again over the next 14 days.   Johnney Killian, RN, BSN, CCM Care Management Coordinator [facility] Phone: [phone]: [phone]

## 2022-05-03 ENCOUNTER — Encounter: Payer: Medicare Other | Admitting: Internal Medicine

## 2022-05-06 ENCOUNTER — Ambulatory Visit: Payer: Medicare Other

## 2022-05-06 NOTE — Chronic Care Management (AMB) (Signed)
Care Management    RN Visit Note  05/06/2022 Name: Christine [name] MRN: [id number] DOB: 08/26/1933  Subjective: Christine [name] is a 88 y.o. year old female who is a primary care patient of [name], MD. The care management team was consulted for assistance with disease management and care coordination needs.    Engaged with patient by telephone for follow up visit in response to provider referral for case management and/or care coordination services.   Consent to Services:   [name] was given information about Care Management services today including:  Care Management services includes personalized support from designated clinical staff supervised by her physician, including individualized plan of care and coordination with other care providers 24/7 contact phone numbers for assistance for urgent and routine care needs. The patient may stop case management services at any time by phone call to the office staff.  Patient agreed to services and consent obtained.   Assessment: Review of patient past medical history, allergies, medications, health status, including review of consultants reports, laboratory and other test data, was performed as part of comprehensive evaluation and provision of chronic care management services.   SDOH (Social Determinants of Health) assessments and interventions performed:    Care Plan  Allergies  Allergen Reactions   Aspirin Swelling and Other (See Comments)    Angioedema   Atarax [Hydroxyzine] Other (See Comments)    psychosis   Nsaids Other (See Comments)    "interacts with heart medications"   Codeine Nausea And Vomiting and Other (See Comments)    "makes me deathly sick" - can take with nausea medicine    Haldol [Haloperidol] Other (See Comments)    EPS - rigidity and dystonic reaction, very sensitive   Other Other (See Comments)    MSG   Penicillins Diarrhea    "real bad" Has patient had a PCN reaction causing immediate rash,  facial/tongue/throat swelling, SOB or lightheadedness with hypotension: Yes Has patient had a PCN reaction causing severe rash involving mucus membranes or skin necrosis: No Has patient had a PCN reaction that required hospitalization No Has patient had a PCN reaction occurring within the last 10 years: Yes If all of the above answers are "NO", then may proceed with Cephalosporin use.    Metoprolol     Chronotropic incompetence    Outpatient Encounter Medications as of 05/06/2022  Medication Sig   acetaminophen (TYLENOL) 500 MG tablet Take 1,000 mg by mouth every 6 (six) hours as needed for headache. For pain   acetaminophen-codeine (TYLENOL #3) 300-30 MG tablet Take 1 tablet by mouth every 6 (six) hours as needed for severe pain.   ADVAIR DISKUS 250-50 MCG/ACT AEPB INHALE 1 PUFF BY MOUTH EVERY 12 HOURS   amLODipine (NORVASC) 10 MG tablet Take 1 tablet (10 mg total) by mouth daily.   Ascorbic Acid (VITAMIN C PO) Take 2 tablets by mouth daily.    atorvastatin (LIPITOR) 40 MG tablet TAKE 1/2 TABLET BY MOUTH EVERY EVENING   benzonatate (TESSALON PERLES) 100 MG capsule Take 1 capsule (100 mg total) by mouth 3 (three) times daily as needed for cough.   clopidogrel (PLAVIX) 75 MG tablet TAKE 1 TABLET BY MOUTH EVERY EVENING   famotidine (PEPCID) 20 MG tablet Take 1 tablet (20 mg total) by mouth daily as needed. Please schedule a yearly follow up for further refills.  Thank you   fluticasone (FLONASE) 50 MCG/ACT nasal spray PLACE 1 SPRAY IN EACH NOSTRIL EVERY DAY   glimepiride (  AMARYL) 1 MG tablet take 1 tablet by mouth once daily BEFORE BREAKFAST.   glucose blood (ONETOUCH ULTRA) test strip Use once daily to check blood sugar before breakfast.   imipramine (TOFRANIL) 50 MG tablet TAKE 1 TABLET(50 MG) BY MOUTH TWICE DAILY   Lancets (ONETOUCH DELICA PLUS [id number]) MISC TEST ONCE DAILY   levothyroxine (SYNTHROID) 25 MCG tablet TAKE 1 TABLET BY MOUTH EVERY DAY   Multiple Vitamin (MULTIVITAMIN WITH  MINERALS) TABS tablet Take 1 tablet by mouth daily. (Patient taking differently: Take 2 tablets by mouth daily.)   ondansetron (ZOFRAN) 4 MG tablet Take 1 tablet (4 mg total) by mouth every 8 (eight) hours as needed for nausea or vomiting.   valsartan (DIOVAN) 320 MG tablet TAKE 1 TABLET(320 MG) BY MOUTH DAILY   No facility-administered encounter medications on file as of 05/06/2022.    Patient Active Problem List   Diagnosis Date Noted   Premature atrial contraction 05/01/2021   Chronotropic incompetence 05/01/2021   Gait instability 09/24/2020   Coronary artery disease involving native coronary artery of native heart with angina pectoris ([place]) 06/23/2020   Cervical radiculopathy at C7 06/23/2020   Chronic diastolic (congestive) heart failure ([place]) 01/13/2019   PAT (paroxysmal atrial tachycardia) ([place]) 01/13/2019   CKD stage 3 due to type 2 diabetes mellitus ([place]) 01/13/2019   Rectal prolapse 06/24/2018   Temporal arteritis ([place]) 04/06/2018   Rheumatic disease 07/29/2017   MDD (major depressive disorder), recurrent, in full remission ([place]) 11/30/2016   Elevated alkaline phosphatase level 09/18/2016   Hypertensive retinopathy of both eyes, grade 2 04/20/2015   Lumbar spondylosis 06/16/2014   Carpal tunnel syndrome 02/18/2014   Osteopenia 01/11/2013   Routine adult health maintenance 01/11/2013   Diverticulosis 09/18/2012   Allergic rhinitis 02/13/2012   HEART MURMUR, SYSTOLIC 81/59/4707   Hyperlipidemia 11/28/2006   Hypothyroidism 09/04/2006   Type 2 diabetes mellitus, controlled ([place]) 09/04/2006   Migraine 09/04/2006   Essential hypertension 09/04/2006   GERD 09/04/2006    Conditions to be addressed/monitored: CHF, HTN, and DMII  Care Plan : RN Care Coordinator Plan of Care  Updates made by Johnney Killian, RN since 05/06/2022 12:00 AM     Problem: Health Promotion or Disease Self-Management (General Plan of Care)      Long-Range Goal: Chronic Disease Management and  Care Coordination Needs (HTN, CHF, DM2)   Start Date: 01/18/2022  Priority: High  Note:   Current Barriers: Successful outreach to patient this morning after she left a voicemail on this RNCM's voicemail.  Patient notes she is having pain in her arms from carpal tunnel and the pain in her left eye and left temple seems to have resolved.  She is worried about it coming back and requested this RNCM send her in a prescription for prednisone so she can take when/if the headache returns.  Explained that I would send a note to Dr. Daryll Drown to see if she was willing to prescribe her some.  Plan to follow up after response from Dr. Daryll Drown. Knowledge Deficits related to plan of care for management of CHF, HTN, and DMII  Chronic Disease Management support and education needs related to CHF, HTN, and DMII  RNCM Clinical Goal(s):  Patient will verbalize basic understanding of  CHF, HTN, and DMII disease process and self health management plan as evidenced by discussions with provider and RNCM. demonstrate Ongoing health management independence as evidenced by A1C below 8 continue to work with RN Care Manager to address care management and care  coordination needs related to  CHF, HTN, and DMII as evidenced by adherence to CM Team Scheduled appointments through collaboration with RN Care manager, provider, and care team.  Interventions: 1:1 collaboration with primary care provider regarding development and update of comprehensive plan of care as evidenced by provider attestation and co-signature Inter-disciplinary care team collaboration (see longitudinal plan of care) Evaluation of current treatment plan related to  self management and patient's adherence to plan as established by provider Heart Failure Interventions:  (Status:  New goal.) Long Term Goal Assessed need for readable accurate scales in home Discussed the importance of keeping all appointments with provider Diabetes Interventions:  (Status:  Goal on  track:  NO.) Long Term Goal Assessed patient's understanding of A1c goal: <7% Reviewed medications with patient and discussed importance of medication adherence Counseled on importance of regular laboratory monitoring as prescribed Discussed plans with patient for ongoing care management follow up and provided patient with direct contact information for care management team Review of patient status, including review of consultants reports, relevant laboratory and other test results, and medications completed Assessed social determinant of health barriers Lab Results  Component Value Date   HGBA1C 7.5 (A) 11/23/2021   Hypertension Interventions:  (Status:  Goal on track:  Yes.) Long Term Goal Last practice recorded BP readings:  BP Readings from Last 3 Encounters:  11/30/21 (!) 135/57  11/23/21 133/64  10/19/21 (!) 160/60  Most recent eGFR/CrCl:  Lab Results  Component Value Date   EGFR 47 (L) 10/19/2021    No components found for: CRCL  Reviewed medications with patient and discussed importance of compliance Discussed plans with patient for ongoing care management follow up and provided patient with direct contact information for care management team Reviewed scheduled/upcoming provider appointments including:  Assessed social determinant of health barriers  Patient Goals/Self-Care Activities: Take all medications as prescribed Attend all scheduled provider appointments Call pharmacy for medication refills 3-7 days in advance of running out of medications Perform all self care activities independently  Call provider office for new concerns or questions  keep legs up while sitting track weight in diary use salt in moderation follow rescue plan if symptoms flare-up check feet daily for cuts, sores or redness enter blood sugar readings and medication or insulin into daily log trim toenails straight across wash and dry feet carefully every day wear comfortable, cotton  socks wear comfortable, well-fitting shoes learn about high blood pressure take medications for blood pressure exactly as prescribed  Follow Up Plan:  The patient has been provided with contact information for the care management team and has been advised to call with any health related questions or concerns.         Plan: The patient has been provided with contact information for the care management team and has been advised to call with any health related questions or concerns.  The care management team will reach out to the patient again over the next 5 days.-Pending response from PCP.  Johnney Killian, RN, BSN, CCM Care Management Coordinator [facility] Phone: [phone]: [phone]

## 2022-05-06 NOTE — Patient Instructions (Signed)
Visit Information  Thank you for taking time to visit with me today. Please don't hesitate to contact me if I can be of assistance to you before our next scheduled telephone appointment. If you are experiencing a Mental Health or [place] or need someone to talk to, please call the [place] National Suicide Prevention Lifeline: (424)869-0243 or TTY: 820-723-1204 TTY (684) 602-5181) to talk to a trained counselor   Patient verbalizes understanding of instructions and care plan provided today and agrees to view in [place]. Active MyChart status and patient understanding of how to access instructions and care plan via MyChart confirmed with patient.     The patient has been provided with contact information for the care management team and has been advised to call with any health related questions or concerns.   Johnney Killian, RN, BSN, CCM Care Management Coordinator [facility] Phone: [phone]: [phone]

## 2022-05-08 DIAGNOSIS — M19041 Primary osteoarthritis, right hand: Secondary | ICD-10-CM | POA: Diagnosis not present

## 2022-05-08 DIAGNOSIS — M19042 Primary osteoarthritis, left hand: Secondary | ICD-10-CM | POA: Diagnosis not present

## 2022-05-16 ENCOUNTER — Telehealth: Payer: Medicare Other

## 2022-05-17 ENCOUNTER — Other Ambulatory Visit: Payer: Self-pay | Admitting: Internal Medicine

## 2022-05-17 DIAGNOSIS — J302 Other seasonal allergic rhinitis: Secondary | ICD-10-CM

## 2022-05-18 DIAGNOSIS — J019 Acute sinusitis, unspecified: Secondary | ICD-10-CM | POA: Diagnosis not present

## 2022-05-22 ENCOUNTER — Other Ambulatory Visit: Payer: Self-pay | Admitting: Internal Medicine

## 2022-05-22 ENCOUNTER — Encounter: Payer: Self-pay | Admitting: Dietician

## 2022-05-22 DIAGNOSIS — E039 Hypothyroidism, unspecified: Secondary | ICD-10-CM

## 2022-06-03 ENCOUNTER — Telehealth: Payer: Self-pay

## 2022-06-03 NOTE — Telephone Encounter (Signed)
  Chronic Care Management   Note  06/03/2022 Name: Christine [name] MRN: [id number] DOB: [birth year]  Patient left message on this RNCM voicemail requesting I ask Debera Lat, RD to make her a 2000 calorie a day diet as she says her CBG readings are high.  Message sent to Dr. Daryll Drown and Butch Penny Plyler to see if patient needs a new referral is needed for diabetes education/nutrition.  Johnney Killian, RN, BSN, CCM Care Management Coordinator [place] Phone: [phone]: [phone]

## 2022-06-06 ENCOUNTER — Other Ambulatory Visit: Payer: Self-pay | Admitting: Dietician

## 2022-06-06 DIAGNOSIS — E119 Type 2 diabetes mellitus without complications: Secondary | ICD-10-CM

## 2022-06-06 NOTE — Progress Notes (Signed)
Referral request

## 2022-06-11 ENCOUNTER — Telehealth: Payer: Self-pay | Admitting: Dietician

## 2022-06-11 NOTE — Telephone Encounter (Signed)
Contacted Christine [name] about an appointment for diabetes meal planning  and professional CGM. She said she'd call tomorrow as soon as she has arranged transportation to th office.

## 2022-06-11 NOTE — Progress Notes (Signed)
I think that's a great idea!

## 2022-06-17 ENCOUNTER — Other Ambulatory Visit: Payer: Medicare Other

## 2022-06-18 ENCOUNTER — Ambulatory Visit
Admission: RE | Admit: 2022-06-18 | Discharge: 2022-06-18 | Disposition: A | Discharge status: Home / Self Care | Payer: Medicare Other | Source: Ambulatory Visit | Attending: Internal Medicine | Admitting: Internal Medicine

## 2022-06-18 ENCOUNTER — Encounter: Payer: Medicare Other | Admitting: Dietician

## 2022-06-18 DIAGNOSIS — Z780 Asymptomatic menopausal state: Secondary | ICD-10-CM | POA: Diagnosis not present

## 2022-06-18 DIAGNOSIS — M8588 Other specified disorders of bone density and structure, other site: Secondary | ICD-10-CM

## 2022-06-18 DIAGNOSIS — M8589 Other specified disorders of bone density and structure, multiple sites: Secondary | ICD-10-CM | POA: Diagnosis not present

## 2022-07-15 ENCOUNTER — Telehealth: Payer: Self-pay | Admitting: *Deleted

## 2022-07-15 ENCOUNTER — Ambulatory Visit: Payer: Self-pay

## 2022-07-15 NOTE — Telephone Encounter (Signed)
Called pt who stated she has been having abd pain x 3 weeks. "It just hurts". Stated the pain comes and goes but her stomach hurts everyday. No appts this afternoon - appt scheduled tomorrow 8/29 with Dr Dema Severin @ 0845 AM.

## 2022-07-15 NOTE — Telephone Encounter (Signed)
-----   Message from Johnney Killian, RN sent at 07/15/2022 10:46 AM EDT ----- Good morning, I received a voicemail from [name] this morning stating that her stomach has been hurting her for a few weeks now and that she wants to come in to have an xray.  Can you please call her? Thank you, Deb Johnney Killian, RN, BSN, CCM Care Management Coordinator [facility] Phone: [phone]: [phone]

## 2022-07-15 NOTE — Patient Outreach (Addendum)
[facility]) Care Management  07/15/2022  Christine [name] Apr 01, 1933 [id number]  Incoming call from patient noting she is having some stomach issues for a few weeks and she wanted to know if she could come into the office for an Xray.  Advised patient I would send message to [facility] Triage nurse team and someone would call her as I soon as they could to address her concerns and get her in to see a doctor.  Patient agreed with plan and expressed appreciation. Message sent to IMP Triage nurse pool for review. Johnney Killian, RN, BSN, CCM Care Management Coordinator [place] Phone: [phone]: [phone]

## 2022-07-16 ENCOUNTER — Ambulatory Visit (INDEPENDENT_AMBULATORY_CARE_PROVIDER_SITE_OTHER): Payer: Medicare Other

## 2022-07-16 DIAGNOSIS — I471 Supraventricular tachycardia: Secondary | ICD-10-CM | POA: Diagnosis not present

## 2022-07-16 DIAGNOSIS — K219 Gastro-esophageal reflux disease without esophagitis: Secondary | ICD-10-CM

## 2022-07-16 DIAGNOSIS — R109 Unspecified abdominal pain: Secondary | ICD-10-CM | POA: Diagnosis not present

## 2022-07-16 NOTE — Progress Notes (Signed)
CC: abdominal pain  HPI:  [name] is a 88 y.o. with medical history as below who presents with 3 weeks of mild, intermittent, non-localized abdominal pain.  Past Medical History:  Diagnosis Date   Anemia    Anginal pain ([place])    Arthritis    "back, arms, hips; hands" (11/30/2015)   Benign hypertensive heart and kidney disease with diastolic CHF, NYHA class II and CKD stage III ([place])    Blood transfusion 1972   "after daughter born, attempted to give me blood; couldn't give it cause my blood was cold" (09/23/2013)   CAD (coronary artery disease)    a. LHC 08/23/11: dLM 40-50%, oRI 40%, oCFX 40%, oD1 70% (small and not amenable to PCI).  LM lesion did not appear to be flow limiting.  Medical rx was recommended;  b. Echo 08/23/11: mild LVH, EF 60-65%, grade 1 diast dysfxn, mild BAE, PASP 24;  c. 02/2012  Cath: LM 50d, LAD 50p, D1 70ost, RI 70p, RCA ok->Med Rx;  d. 01/2013 Cardiolite: EF 88, no ischemia/infarct.   Carotid stenosis    a. dopplers 10/12:  0-39% bilat ICA;  b. 10/2012 U/S: 0-39% bilat, f/u 1 yr (10/2013).   Chronic bronchitis    Chronotropic incompetence 05/01/2021   Depression    Diverticulosis of colon (without mention of hemorrhage)    GERD (gastroesophageal reflux disease)    Heart murmur, systolic    2-D echo in December 2011 showed a normal EF with grade 1 diastolic dysfunction, trivial pulmonary regurgitation and mildly elevated PA pressure at 37 mmHg probably secondary to her COPD.dynamic obstruction-mid cavity obliteration;  b. 02/2012 Echo: EF 60-65%, mild LVH, PASP 39mHg.   History of stomach ulcers 1970's   Hyperlipidemia    Hypertension    Hypothyroidism    Migraines    a. Next atypical symptoms in the past. Patient was started on Neurontin for possible neuropathic origin of her pain.   Polymyalgia rheumatica (HCC)    Premature atrial contraction 05/01/2021   She remains asymptomatic.  No medication indicated.   Shortness of breath on exertion    "just  related to angina >1 yr ago" (09/23/2013)   Sinus arrhythmia    Solitary pulmonary nodule 12/15/2006   CT Chest in January 2008  IMPRESSION:  1. Stable CT of the chest with mild biapical scarring and scattered nodularity, most likely postinflammatory in the absence of a history of malignancy.  2. No acute chest findings are demonstrated.  3. Unless the patient has a history of malignancy or risk factors for lung cancer, the chest findings do not necessarily require any specific followup. If follow    Type II diabetes mellitus ([place]    a. On oral hypoglycemic agents.   Review of Systems:  See detailed assessment and plan for pertinent ROS.  Physical Exam:  Vitals:   07/16/22 0911  BP: 132/61  Pulse: (!) 102  Temp: 98.3 F (36.8 C)  TempSrc: Oral  SpO2: 100%  Weight: 131 lb 12.8 oz (59.8 kg)  Height: '5\' 4"'$  (1.626 m)   Physical Exam Constitutional:      General: She is not in acute distress. HENT:     Head: Normocephalic and atraumatic.  Eyes:     Extraocular Movements: Extraocular movements intact.  Cardiovascular:     Rate and Rhythm: Regular rhythm. Tachycardia present.     Comments: 2/6 systolic murmur heard best over RUSB Pulmonary:     Effort: Pulmonary effort is normal.  Breath sounds: Normal breath sounds. No wheezing or rhonchi.  Abdominal:     General: There is no distension.     Palpations: Abdomen is soft.     Tenderness: There is abdominal tenderness.     Comments: Mild tenderness to palpation diffusely.  Skin:    General: Skin is warm and dry.  Neurological:     Mental Status: She is alert.  Psychiatric:        Mood and Affect: Mood normal.        Behavior: Behavior normal.      Assessment & Plan:   See Encounters Tab for problem based charting.  PAT (paroxysmal atrial tachycardia) (HCC) Patient with pulse of 102. She is asymptomatic. Unable to tolerate BB in past due to chronotropic incompetence.   -No medication indicated. -Continue to  monitor.   Nonspecific abdominal pain Patient presents with 3 weeks of mild, intermittent, non-localized abdominal pain. She has no associated symptoms and denies fever. Denies any diarrhea or constipation and endorses good glucose control and Synthroid adherence. Diverticulosis on colonoscopy in 2009. Her presentation does not favor diverticulitis. However, if present, this is mild and should resolve. Unsure of etiology but symptoms not concerning for serious abdominal processes. Vitals stable.  -Reassure that medications are unlikely contributing to symptoms -Encouraged to call back/return if symptoms worsen -Scheduled for 3 month follow up with Dr. Daryll Drown in September  GERD Patient endorses an episode of symptoms suspicious for GERD yesterday but only momentarily. She denies burning currently and denies other symptoms. Endorses taking famotidine. Remote history of gastric ulcer in 1976. Denies vomiting or signs of melena.  -Encouraged use of Pepcid if this helps her symptoms -Could consider PPI trial at follow up if this is still an issue for her. Will not start today given only one episode which was mild and short-lived.    Patient seen with Dr. Philipp Ovens

## 2022-07-16 NOTE — Addendum Note (Signed)
Addended by: Jodean Lima on: 07/16/2022 11:06 AM   Modules accepted: Level of Service

## 2022-07-16 NOTE — Assessment & Plan Note (Signed)
Patient presents with 3 weeks of mild, intermittent, non-localized abdominal pain. She has no associated symptoms and denies fever. Denies any diarrhea or constipation and endorses good glucose control and Synthroid adherence. Diverticulosis on colonoscopy in 2009. Her presentation does not favor diverticulitis. However, if present, this is mild and should resolve. Unsure of etiology but symptoms not concerning for serious abdominal processes. Vitals stable.  -Reassure that medications are unlikely contributing to symptoms -Encouraged to call back/return if symptoms worsen -Scheduled for 3 month follow up with Dr. Daryll Drown in September

## 2022-07-16 NOTE — Assessment & Plan Note (Signed)
Patient endorses an episode of symptoms suspicious for GERD yesterday but only momentarily. She denies burning currently and denies other symptoms. Endorses taking famotidine. Remote history of gastric ulcer in 1976. Denies vomiting or signs of melena.  -Encouraged use of Pepcid if this helps her symptoms -Could consider PPI trial at follow up if this is still an issue for her. Will not start today given only one episode which was mild and short-lived.

## 2022-07-16 NOTE — Assessment & Plan Note (Signed)
Patient with pulse of 102. She is asymptomatic. Unable to tolerate BB in past due to chronotropic incompetence.   -No medication indicated. -Continue to monitor.

## 2022-07-16 NOTE — Progress Notes (Signed)
Internal Medicine Clinic Attending   I saw and evaluated the patient.  I personally confirmed the key portions of the history and exam documented by Dr. White and I reviewed pertinent patient test results.  The assessment, diagnosis, and plan were formulated together and I agree with the documentation in the resident's note.  

## 2022-07-16 NOTE — Patient Instructions (Addendum)
Ms.Riva R Wernert, it was a pleasure seeing you today!  Today we discussed: Abdominal pain: Your abdominal pain does not seem to be attributable to any concerning conditions. Your pain also is not likely a result of medications or worsening of other chronic conditions. We are reassured by the mild severity, good vitals, and that you are otherwise feeling well. We expect your symptoms will resolve but please let us know if they worsen.  I have ordered the following labs today:  Lab Orders  No laboratory test(s) ordered today     Tests ordered today:  none  Referrals ordered today:   Referral Orders  No referral(s) requested today     I have ordered the following medication/changed the following medications:   Stop the following medications: There are no discontinued medications.   Start the following medications: No orders of the defined types were placed in this encounter.    Follow-up:  You will come in to see Dr. Daryll Drown in October as previously scheduled.    Please make sure to arrive 15 minutes prior to your next appointment. If you arrive late, you may be asked to reschedule.   We look forward to seeing you next time. Please call our clinic at [phone] if you have any questions or concerns. The best time to call is Monday-Friday from 9am-4pm, but there is someone available 24/7. If after hours or the weekend, call the main hospital number and ask for the Internal Medicine Resident On-Call. If you need medication refills, please notify your pharmacy one week in advance and they will send [place] a request.  Thank you for letting us take part in your care. Wishing you the best!  Thank you, Sanjuan Dame, MD

## 2022-08-08 ENCOUNTER — Telehealth: Payer: Self-pay

## 2022-08-08 DIAGNOSIS — K219 Gastro-esophageal reflux disease without esophagitis: Secondary | ICD-10-CM

## 2022-08-08 NOTE — Patient Outreach (Signed)
  Care Coordination   Follow Up Visit Note   08/08/2022 Name: Christine [name] MRN: [id number] DOB: 1933-09-26  Christine [name] is a 88 y.o. year old female who sees [name], MD for primary care. I  received a voicemail from patient on 07/2022 at 4:32.  What matters to the patients health and wellness today?  Patient is concerned her pharmacy has not been able to get her a refill for Famotidine and she requests assistance.  Message sent to Dr. Collene Gobble requesting refill or letting this RNCM know if I need to advise patient she needs to come to clinic.    Goals Addressed   None     SDOH assessments and interventions completed:  Yes     Care Coordination Interventions Activated:  Yes  Care Coordination Interventions:  Yes, provided   Follow up plan:  Pending response from MD    Encounter Outcome:  Pt. Visit Completed

## 2022-08-09 ENCOUNTER — Telehealth: Payer: Self-pay

## 2022-08-09 MED ORDER — FAMOTIDINE 20 MG PO TABS: 20.0000 mg | ORAL_TABLET | Freq: Every day | ORAL | 2 refills | Status: DC | PRN

## 2022-08-09 NOTE — Patient Outreach (Signed)
  Care Coordination   08/09/2022 Name: Christine [name] MRN: [id number] DOB: 09-28-1933   Successful call to patient to let her know Dr. Collene Gobble has sent in her prescription for Famotidine with 2 refills.  Patient asked this RNCM to send a message to Dr. Daryll Drown and let her know that her tablet is not working and she cannot access my chart.  Johnney Killian, RN, BSN, CCM Care Management Coordinator [place] Phone: [phone]: [phone]

## 2022-08-09 NOTE — Addendum Note (Signed)
Addended bySanjuan Dame on: 08/09/2022 08:21 AM   Modules accepted: Orders

## 2022-08-22 ENCOUNTER — Other Ambulatory Visit: Payer: Self-pay | Admitting: Internal Medicine

## 2022-08-22 DIAGNOSIS — F3342 Major depressive disorder, recurrent, in full remission: Secondary | ICD-10-CM

## 2022-09-04 ENCOUNTER — Other Ambulatory Visit: Payer: Self-pay | Admitting: Internal Medicine

## 2022-09-04 DIAGNOSIS — E119 Type 2 diabetes mellitus without complications: Secondary | ICD-10-CM

## 2022-09-06 ENCOUNTER — Other Ambulatory Visit: Payer: Self-pay

## 2022-09-06 ENCOUNTER — Encounter: Payer: Self-pay | Admitting: Internal Medicine

## 2022-09-06 ENCOUNTER — Ambulatory Visit (INDEPENDENT_AMBULATORY_CARE_PROVIDER_SITE_OTHER): Payer: Medicare Other | Admitting: Internal Medicine

## 2022-09-06 VITALS — BP 140/63 | HR 75 | Temp 97.8°F | Ht 64.0 in | Wt 132.5 lb

## 2022-09-06 DIAGNOSIS — I25119 Atherosclerotic heart disease of native coronary artery with unspecified angina pectoris: Secondary | ICD-10-CM | POA: Diagnosis not present

## 2022-09-06 DIAGNOSIS — N183 Chronic kidney disease, stage 3 unspecified: Secondary | ICD-10-CM

## 2022-09-06 DIAGNOSIS — G43809 Other migraine, not intractable, without status migrainosus: Secondary | ICD-10-CM | POA: Diagnosis not present

## 2022-09-06 DIAGNOSIS — R748 Abnormal levels of other serum enzymes: Secondary | ICD-10-CM | POA: Diagnosis not present

## 2022-09-06 DIAGNOSIS — I10 Essential (primary) hypertension: Secondary | ICD-10-CM | POA: Diagnosis not present

## 2022-09-06 DIAGNOSIS — I5032 Chronic diastolic (congestive) heart failure: Secondary | ICD-10-CM | POA: Diagnosis not present

## 2022-09-06 DIAGNOSIS — M19041 Primary osteoarthritis, right hand: Secondary | ICD-10-CM | POA: Diagnosis not present

## 2022-09-06 DIAGNOSIS — Z23 Encounter for immunization: Secondary | ICD-10-CM

## 2022-09-06 DIAGNOSIS — I130 Hypertensive heart and chronic kidney disease with heart failure and stage 1 through stage 4 chronic kidney disease, or unspecified chronic kidney disease: Secondary | ICD-10-CM

## 2022-09-06 DIAGNOSIS — K219 Gastro-esophageal reflux disease without esophagitis: Secondary | ICD-10-CM

## 2022-09-06 DIAGNOSIS — M19042 Primary osteoarthritis, left hand: Secondary | ICD-10-CM | POA: Diagnosis not present

## 2022-09-06 DIAGNOSIS — M790 Rheumatism, unspecified: Secondary | ICD-10-CM

## 2022-09-06 DIAGNOSIS — E1122 Type 2 diabetes mellitus with diabetic chronic kidney disease: Secondary | ICD-10-CM | POA: Diagnosis not present

## 2022-09-06 DIAGNOSIS — E119 Type 2 diabetes mellitus without complications: Secondary | ICD-10-CM

## 2022-09-06 LAB — POCT GLYCOSYLATED HEMOGLOBIN (HGB A1C): Hemoglobin A1C: 7.8 % — AB (ref 4.0–5.6)

## 2022-09-06 LAB — GLUCOSE, CAPILLARY: Glucose-Capillary: 138 mg/dL — ABNORMAL HIGH (ref 70–99)

## 2022-09-06 MED ORDER — ONETOUCH ULTRA VI STRP: ORAL_STRIP | 11 refills | Status: DC

## 2022-09-06 MED ORDER — FAMOTIDINE 20 MG PO TABS: 20.0000 mg | ORAL_TABLET | Freq: Two times a day (BID) | ORAL | 2 refills | Status: DC

## 2022-09-06 MED ORDER — ONETOUCH DELICA PLUS LANCET33G MISC: 11 refills | Status: DC

## 2022-09-06 MED ORDER — ACETAMINOPHEN-CODEINE 300-30 MG PO TABS: 1.0000 | ORAL_TABLET | ORAL | 0 refills | Status: DC | PRN

## 2022-09-06 NOTE — Patient Instructions (Addendum)
Ms. Fait - -  For your heartburn, I would plan to increase your pepcid (famotidine) to twice daily.  You can take the second dose 30 minutes before dinner.    I have refilled your pain medication today.    Come back to see me in 3 months, call in earlier if needed.   Thank you!

## 2022-09-06 NOTE — Progress Notes (Unsigned)
Established Patient Office Visit  Subjective   [name], female    DOB: 1933-09-27  Age: 88 y.o. MRN: [id number]  Chief Complaint  Patient presents with   Check-up Visit    Back and hand pain.   Medication Refill    Pain med.   Flu Vaccine    [name] is an 62 years woman with PMH Of GCA (intermittent headaches and pains), HTN, PACs and PAT (not tolerating a BB due to chronotropic incompetence), CAD (follows with Cardiology), CKD, rectal prolapse, migraine, hypothyroidism, osteopenia who presents for follow up.   [name] has been having intermittent abdominal pain based on chart review, however this seems to have improved.  She notes continued pain in the thighs when having bowel movements.  She has continued issues with rectal prolapse that has not improved or worsened.  She has chronic hand pain, worse on the right today as she had injections on the left.  She has been taking her famotidine without issue.  She notes having dyspepsia, epigastric abdominal pain this morning that is currently better.  She feels that she might have eaten late last night.   She notes increased blood sugars.  She has been eating a lot of sweets and has noticed weight gain.  Her A1C is also elevated today.  She brought up her breasts getting bigger.  I asked for clarification and she noted them getting bigger due to gaining weight.  She does not feel they are changed in texture, size, or other findings today.  She has not had discharge or skin changes.  We opted not to do an exam today.     Patient Active Problem List   Diagnosis Date Noted   Nonspecific abdominal pain 07/16/2022   Primary osteoarthritis of both hands 05/08/2022   Premature atrial contraction 05/01/2021   Chronotropic incompetence 05/01/2021   Gait instability 09/24/2020   Coronary artery disease involving native coronary artery of native heart with angina pectoris ([place]) 06/23/2020   Cervical radiculopathy at C7  06/23/2020   Chronic diastolic (congestive) heart failure ([place]) 01/13/2019   PAT (paroxysmal atrial tachycardia) 01/13/2019   CKD stage 3 due to type 2 diabetes mellitus ([place]) 01/13/2019   Rectal prolapse 06/24/2018   Temporal arteritis ([place]) 04/06/2018   Rheumatic disease 07/29/2017   MDD (major depressive disorder), recurrent, in full remission ([place]) 11/30/2016   Elevated alkaline phosphatase level 09/18/2016   Hypertensive retinopathy of both eyes, grade 2 04/20/2015   Lumbar spondylosis 06/16/2014   Carpal tunnel syndrome 02/18/2014   Osteopenia 01/11/2013   Routine adult health maintenance 01/11/2013   Diverticulosis 09/18/2012   Allergic rhinitis 02/13/2012   HEART MURMUR, SYSTOLIC 74/82/7078   Hyperlipidemia 11/28/2006   Hypothyroidism 09/04/2006   Type 2 diabetes mellitus, controlled ([place]) 09/04/2006   Migraine 09/04/2006   Essential hypertension 09/04/2006   GERD 09/04/2006      Review of Systems  Constitutional:  Negative for chills, fever, malaise/fatigue and weight loss.  HENT:  Positive for hearing loss.   Respiratory:  Negative for cough.   Cardiovascular:  Negative for chest pain and palpitations.  Gastrointestinal:  Positive for abdominal pain and heartburn. Negative for constipation, diarrhea and nausea.  Genitourinary:  Negative for dysuria.  Musculoskeletal:  Positive for myalgias.  Neurological:  Negative for dizziness and weakness.  Psychiatric/Behavioral:  Negative for depression. The patient is not nervous/anxious.       Objective:     BP (!) 140/63 (BP Location: Right Arm,  Patient Position: Sitting, Cuff Size: Normal)   Pulse 75   Temp 97.8 F (36.6 C) (Oral)   Ht 5' 4"  (1.626 m)   Wt 132 lb 8 oz (60.1 kg)   LMP 02/11/1975 (Approximate)   SpO2 99% Comment: RA  BMI 22.74 kg/m  BP Readings from Last 3 Encounters:  09/06/22 (!) 140/63  07/16/22 132/61  04/29/22 131/60   Wt Readings from Last 3 Encounters:  09/06/22 132 lb 8 oz (60.1 kg)   07/16/22 131 lb 12.8 oz (59.8 kg)  04/29/22 131 lb 1.6 oz (59.5 kg)      Physical Exam Vitals and nursing note reviewed.  Constitutional:      General: She is not in acute distress.    Appearance: Normal appearance. She is normal weight. She is not toxic-appearing.  HENT:     Head: Normocephalic and atraumatic.  Cardiovascular:     Rate and Rhythm: Normal rate and regular rhythm.     Pulses: Normal pulses.     Heart sounds: No murmur heard. Pulmonary:     Effort: Pulmonary effort is normal. No respiratory distress.     Breath sounds: Normal breath sounds.  Abdominal:     General: Abdomen is flat.     Palpations: Abdomen is soft. There is no mass.     Tenderness: There is no abdominal tenderness.  Musculoskeletal:        General: No swelling, tenderness or signs of injury.  Skin:    General: Skin is warm.  Neurological:     Mental Status: She is alert and oriented to person, place, and time. Mental status is at baseline.  Psychiatric:        Mood and Affect: Mood normal.        Behavior: Behavior normal.      Results for orders placed or performed in visit on 09/06/22  Glucose, capillary  Result Value Ref Range   Glucose-Capillary 138 (H) 70 - 99 mg/dL  CMP14 + Anion Gap  Result Value Ref Range   Glucose 142 (H) 70 - 99 mg/dL   BUN 20 8 - 27 mg/dL   Creatinine, Ser 0.99 0.57 - 1.00 mg/dL   eGFR 55 (L) >59 mL/min/1.73   BUN/Creatinine Ratio 20 12 - 28   Sodium 141 134 - 144 mmol/L   Potassium 4.2 3.5 - 5.2 mmol/L   Chloride 105 96 - 106 mmol/L   CO2 18 (L) 20 - 29 mmol/L   Anion Gap 18.0 10.0 - 18.0 mmol/L   Calcium 9.6 8.7 - 10.3 mg/dL   Total Protein 7.3 6.0 - 8.5 g/dL   Albumin 4.6 3.7 - 4.7 g/dL   Globulin, Total 2.7 1.5 - 4.5 g/dL   Albumin/Globulin Ratio 1.7 1.2 - 2.2   Bilirubin Total <0.2 0.0 - 1.2 mg/dL   Alkaline Phosphatase 185 (H) 44 - 121 IU/L   AST 23 0 - 40 IU/L   ALT 19 0 - 32 IU/L  POC Hbg A1C  Result Value Ref Range   Hemoglobin A1C 7.8  (A) 4.0 - 5.6 %   HbA1c POC (<> result, manual entry)     HbA1c, POC (prediabetic range)     HbA1c, POC (controlled diabetic range)      CMET today as well.      Assessment & Plan:   Problem List Items Addressed This Visit       Unprioritized   Type 2 diabetes mellitus, controlled ([place]) - Primary (Chronic)    A1C  is increased today.  She notes dietary indiscretion and weight gain.  We discussed increasing her glipizide, but she would prefer to change her diet for the next 3 months and see if this helps.   Plan Dietary changes Follow up in 3 months CMET today.       Relevant Medications   glucose blood (ONETOUCH ULTRA) test strip   Lancets (ONETOUCH DELICA PLUS [id number]) MISC   Other Relevant Orders   POC Hbg A1C (Completed)   Migraine (Chronic)    She notes having a headache recently, but that has resolved without issue with her normal medicaitons.   Continue imipramine.       Relevant Medications   acetaminophen-codeine (TYLENOL #3) 300-30 MG tablet   Essential hypertension (Chronic)    BP today is 154/71, she did not take her medications today.  She notes having variable blood pressures at home, and sometimes forgetting to write them down.  She has not been able to tolerate up titration or additional medications due to chronotropic intolerance and dizziness.   Plan Continue amlodipine, valsartan today Check CMET      Relevant Orders   CMP14 + Anion Gap (Completed)   GERD (Chronic)    She is having breakthrough symptoms.  She reports taking the medication appropriately.  I advised her to INCREASE her famotidine to BID dosing and see if this helps. Also, avoid late night snacking.       Relevant Medications   famotidine (PEPCID) 20 MG tablet   Chronic diastolic (congestive) heart failure (HCC) (Chronic)    She has no sign of volume overload.   Continue ARB.  She has not tolerated BB in the past.   Follow up with Cardiology as planned.       CKD stage 3  due to type 2 diabetes mellitus (HCC) (Chronic)    Will follow up with CMET today, also evaluating elevated ALP      Relevant Orders   CMP14 + Anion Gap (Completed)   Elevated alkaline phosphatase level    CMET today for further evaluation.       Relevant Orders   CMP14 + Anion Gap (Completed)   Rheumatic disease    She has chronic disease of the hands and PMR.  She is on chronic tylenol #3 which helps her pain.   Plan Refill tylenol #3      Relevant Medications   acetaminophen-codeine (TYLENOL #3) 300-30 MG tablet   Primary osteoarthritis of both hands    She has chronic disease of the hands and PMR.  She is on chronic tylenol #3 which helps her pain.   Plan Refill tylenol #3 Follow up with practitioner for injections as needed.       Relevant Medications   acetaminophen-codeine (TYLENOL #3) 300-30 MG tablet   Other Visit Diagnoses     Need for immunization against influenza       Relevant Orders   Flu Vaccine QUAD High Dose(Fluad) (Completed)       Return in about 3 months (around 12/07/2022).    Gilles Chiquito, MD

## 2022-09-07 LAB — CMP14 + ANION GAP
ALT: 19 IU/L (ref 0–32)
AST: 23 IU/L (ref 0–40)
Albumin/Globulin Ratio: 1.7 (ref 1.2–2.2)
Albumin: 4.6 g/dL (ref 3.7–4.7)
Alkaline Phosphatase: 185 IU/L — ABNORMAL HIGH (ref 44–121)
Anion Gap: 18 mmol/L (ref 10.0–18.0)
BUN/Creatinine Ratio: 20 (ref 12–28)
BUN: 20 mg/dL (ref 8–27)
Bilirubin Total: <0.2 mg/dL (ref 0.0–1.2)
CO2: 18 mmol/L — ABNORMAL LOW (ref 20–29)
Calcium: 9.6 mg/dL (ref 8.7–10.3)
Chloride: 105 mmol/L (ref 96–106)
Creatinine, Ser: 0.99 mg/dL (ref 0.57–1.00)
Globulin, Total: 2.7 g/dL (ref 1.5–4.5)
Glucose: 142 mg/dL — ABNORMAL HIGH (ref 70–99)
Potassium: 4.2 mmol/L (ref 3.5–5.2)
Sodium: 141 mmol/L (ref 134–144)
Total Protein: 7.3 g/dL (ref 6.0–8.5)
eGFR: 55 mL/min/{1.73_m2} — ABNORMAL LOW (ref >59)

## 2022-09-08 NOTE — Assessment & Plan Note (Signed)
She is having breakthrough symptoms.  She reports taking the medication appropriately.  I advised her to INCREASE her famotidine to BID dosing and see if this helps. Also, avoid late night snacking.

## 2022-09-08 NOTE — Assessment & Plan Note (Signed)
Will follow up with CMET today, also evaluating elevated ALP

## 2022-09-08 NOTE — Assessment & Plan Note (Signed)
BP today is 154/71, she did not take her medications today.  She notes having variable blood pressures at home, and sometimes forgetting to write them down.  She has not been able to tolerate up titration or additional medications due to chronotropic intolerance and dizziness.   Plan Continue amlodipine, valsartan today Check CMET

## 2022-09-08 NOTE — Assessment & Plan Note (Signed)
She has chronic disease of the hands and PMR.  She is on chronic tylenol #3 which helps her pain.   Plan Refill tylenol #3 Follow up with practitioner for injections as needed.

## 2022-09-08 NOTE — Assessment & Plan Note (Signed)
She notes having a headache recently, but that has resolved without issue with her normal medicaitons.   Continue imipramine.

## 2022-09-08 NOTE — Assessment & Plan Note (Signed)
She has no sign of volume overload.   Continue ARB.  She has not tolerated BB in the past.   Follow up with Cardiology as planned.

## 2022-09-08 NOTE — Assessment & Plan Note (Signed)
CMET today for further evaluation.

## 2022-09-08 NOTE — Assessment & Plan Note (Signed)
A1C is increased today.  She notes dietary indiscretion and weight gain.  We discussed increasing her glipizide, but she would prefer to change her diet for the next 3 months and see if this helps.   Plan Dietary changes Follow up in 3 months CMET today.

## 2022-09-08 NOTE — Assessment & Plan Note (Signed)
She has chronic disease of the hands and PMR.  She is on chronic tylenol #3 which helps her pain.   Plan Refill tylenol #3

## 2022-09-10 NOTE — Telephone Encounter (Signed)
OPEN ERROR

## 2022-10-07 DIAGNOSIS — Z23 Encounter for immunization: Secondary | ICD-10-CM | POA: Diagnosis not present

## 2022-10-14 ENCOUNTER — Telehealth: Payer: Self-pay

## 2022-10-14 NOTE — Patient Outreach (Signed)
  Care Coordination   Collaborated  Visit Note   10/14/2022 Name: Christine [name] MRN: [id number] DOB: [birth year]  Christine [name] is a 88 y.o. year old female who sees Christine Falcon, MD for primary care. I spoke with  Christine [name] by phone today.  What matters to the patients health and wellness today?  Prednisone Prescription    Goals Addressed               This Visit's Progress     COMPLETED: "I need some more Prednisone for my Temporal Arteritis (pt-stated)        Care Coordination Interventions: Evaluation of current treatment plan related to patients arthritis and patient's adherence to plan as established by provider Collaborated with Christine [name] regarding patients request for Prednisone. Assessed social determinant of health barriers        SDOH assessments and interventions completed:  Yes  SDOH Interventions Today    Flowsheet Row Most Recent Value  SDOH Interventions   Food Insecurity Interventions Intervention Not Indicated  Housing Interventions Intervention Not Indicated        Care Coordination Interventions:  Yes, provided   Follow up plan:  Contact patient after MD review of request for prescription    Encounter Outcome:  Pt. Visit Completed

## 2022-10-15 ENCOUNTER — Encounter: Payer: Self-pay | Admitting: Internal Medicine

## 2022-10-16 ENCOUNTER — Telehealth: Payer: Self-pay

## 2022-10-16 MED ORDER — PREDNISONE 20 MG PO TABS: 20.0000 mg | ORAL_TABLET | Freq: Every day | ORAL | 0 refills | Status: DC

## 2022-10-16 NOTE — Patient Outreach (Signed)
  Care Coordination   Follow Up Visit Note   10/16/2022 Name: Christine [name] MRN: [id number] DOB: 11/11/1933  Christine [name] is a 88 y.o. year old female who sees Christine Falcon, MD for primary care. I spoke with  Christine [name] by phone today.  What matters to the patients health and wellness today?  I need to get my prescription for prednisone for Temporal Arteritis.    Goals Addressed               This Visit's Progress     COMPLETED: "I need some more Prednisone for my Temporal Arteritis (pt-stated)        Care Coordination Interventions: Evaluation of current treatment plan related to patients arthritis and patient's adherence to plan as established by provider Collaborated with Dr. Daryll Drown regarding patients request for Prednisone. Assessed social determinant of health barriers 10/16/22 patient called to inquire if Dr. Daryll Drown had responded to her request for prednisone.  Informed her this Probation officer has not heard from MD and will call her when I hear from Dr. Daryll Drown.        SDOH assessments and interventions completed:  No  SDOH Interventions Today    Flowsheet Row Most Recent Value  SDOH Interventions   Transportation Interventions Intervention Not Indicated  Utilities Interventions Intervention Not Indicated        Care Coordination Interventions:  Yes, provided   Follow up plan:  Plan to contact patient after response from Dr. Daryll Drown.    Encounter Outcome:  Pt. Visit Completed

## 2022-10-16 NOTE — Addendum Note (Signed)
Addended by: Gilles Chiquito B on: 10/16/2022 02:53 PM   Modules accepted: Orders

## 2022-10-21 ENCOUNTER — Telehealth: Payer: Self-pay

## 2022-10-21 NOTE — Patient Outreach (Signed)
  Care Coordination   Collaboration  Visit Note   10/21/2022 Name: TOM RAGSDALE MRN: [id number] DOB: 1933-06-30  KATURAH KARAPETIAN is a 88 y.o. year old female who sees [name], MD for primary care. I  collaborate with Dr. Daryll Drown on prednisone.  What matters to the patients health and wellness today?  Prednisone for Temporal Enteritis.    Goals Addressed               This Visit's Progress     COMPLETED: "I need some more Prednisone for my Temporal Arteritis (pt-stated)        Care Coordination Interventions: Evaluation of current treatment plan related to patients arthritis and patient's adherence to plan as established by provider Collaborated with Dr. Daryll Drown regarding patients request for Prednisone. Assessed social determinant of health barriers 10/16/22 patient called to inquire if Dr. Daryll Drown had responded to her request for prednisone.  Informed her this Probation officer has not heard from MD and will call her when I hear from Dr. Daryll Drown. 10/21/22-Collaborated with Dr. Daryll Drown and she informed this writer she has prescribed the prednisone for Ms. Markus.        SDOH assessments and interventions completed:  Yes  SDOH Interventions Today    Flowsheet Row Most Recent Value  SDOH Interventions   Food Insecurity Interventions Intervention Not Indicated  Housing Interventions Intervention Not Indicated        Care Coordination Interventions:  Yes, provided   Follow up plan:  Patient to call as needed.    Encounter Outcome:  Pt. Visit Completed

## 2022-10-29 ENCOUNTER — Encounter: Payer: Self-pay | Admitting: Internal Medicine

## 2022-10-30 NOTE — Telephone Encounter (Signed)
Called pt - no answer; left message on self-identified vm to call the office.

## 2022-11-14 ENCOUNTER — Other Ambulatory Visit: Payer: Self-pay | Admitting: Internal Medicine

## 2022-11-14 DIAGNOSIS — J302 Other seasonal allergic rhinitis: Secondary | ICD-10-CM

## 2022-11-20 ENCOUNTER — Other Ambulatory Visit: Payer: Self-pay | Admitting: Student

## 2022-11-20 ENCOUNTER — Other Ambulatory Visit: Payer: Self-pay | Admitting: Internal Medicine

## 2022-11-20 DIAGNOSIS — E785 Hyperlipidemia, unspecified: Secondary | ICD-10-CM

## 2022-11-20 DIAGNOSIS — E119 Type 2 diabetes mellitus without complications: Secondary | ICD-10-CM

## 2022-11-20 DIAGNOSIS — E039 Hypothyroidism, unspecified: Secondary | ICD-10-CM

## 2022-11-22 ENCOUNTER — Encounter: Payer: Medicare Other | Admitting: Internal Medicine

## 2022-12-09 ENCOUNTER — Ambulatory Visit: Payer: Medicare Other

## 2022-12-09 ENCOUNTER — Encounter: Payer: Medicare Other | Admitting: Internal Medicine

## 2022-12-09 ENCOUNTER — Ambulatory Visit (INDEPENDENT_AMBULATORY_CARE_PROVIDER_SITE_OTHER): Payer: Medicare Other | Admitting: Internal Medicine

## 2022-12-09 ENCOUNTER — Other Ambulatory Visit: Payer: Self-pay

## 2022-12-09 ENCOUNTER — Encounter: Payer: Self-pay | Admitting: Internal Medicine

## 2022-12-09 VITALS — BP 130/55 | HR 72 | Temp 97.9°F | Ht 64.0 in | Wt 130.0 lb

## 2022-12-09 DIAGNOSIS — I10 Essential (primary) hypertension: Secondary | ICD-10-CM

## 2022-12-09 DIAGNOSIS — K219 Gastro-esophageal reflux disease without esophagitis: Secondary | ICD-10-CM | POA: Diagnosis not present

## 2022-12-09 DIAGNOSIS — E119 Type 2 diabetes mellitus without complications: Secondary | ICD-10-CM | POA: Diagnosis not present

## 2022-12-09 DIAGNOSIS — G43809 Other migraine, not intractable, without status migrainosus: Secondary | ICD-10-CM

## 2022-12-09 DIAGNOSIS — M316 Other giant cell arteritis: Secondary | ICD-10-CM

## 2022-12-09 LAB — POCT GLYCOSYLATED HEMOGLOBIN (HGB A1C): Hemoglobin A1C: 7.2 % — AB (ref 4.0–5.6)

## 2022-12-09 LAB — GLUCOSE, CAPILLARY: Glucose-Capillary: 116 mg/dL — ABNORMAL HIGH (ref 70–99)

## 2022-12-09 NOTE — Assessment & Plan Note (Addendum)
Patient was most recently given 20 tablets of Pred '20mg'$  11/29.  She did not complete whole course due to intolerance. Felt jittery. Has not taken steroids since. The prednisone did resolve headache. Headache has not returned.   Not having any temporal tenderness.   Patient has been receiving variable doses of prednisone since at least 2014 with doses as low as '1mg'$ . The duration of prednisone treatment does suggest more chronic manifestation of this disease vs alternative etiology since typical duration 1-2 years. Patient has declined temporal artery biopsy in the past so we do not have tissue to confirm diagnosis, however, she does have hx of PMR and elevated ESR in the past which does make her risk of this disease higher. She does have a history of migraines and PMR so I do wonder what component of this is migraines in the setting of PMR. Since she has not been taking steroids for over 1 month and not having any recurrent symptoms, would like to avoid restarting taper at this point. Furthermore, patient has hx of osteopenia and is at risk of worsening of this condition with steroid use.

## 2022-12-09 NOTE — Assessment & Plan Note (Addendum)
A1c was noted to be elevated at last OV due to dietary indiscretion. She also reported some weight gain. Dietary modifications vs glimepiride increase were discussed. Patient opted for diet modification. Today, patient reports that she was able to make some dietary modifications. She has been checking her CBG at home as well with AM fasting typically in the 110's. Has not had any symptoms of hypoglycemia.   Glucose seems to be better controlled today and A1c has improved to 7.2, down from 7.8. Interestingly, this is in the setting of approximately 16 days use of prednisone for a GCA flare. This does make me question how well patient has been doing with PO intake in the setting of sulfonylurea use, but fortunately her AM fasting sugars are wnl. If she does begin to have any symptomatic hypoglycemia, she is at risk for falls and fractures given hx of osteopenia. Additionally, given her advanced age, strict glucose control in this patient unlikely to confer many long-term benefits. Furthermore, she has been stable on this medication for many years so we will not make any adjustments at this time. Will plan to follow up in 3 months for re-evaluation.

## 2022-12-09 NOTE — Progress Notes (Signed)
CC: DM  HPI:Christine [name] is a 89 y.o. female who presents for evaluation of DM. Please see individual problem based A/P for details.  Pmh GCA, HTN, PACs and PAT (does not tolerate BB due to chronotropic incompetence), CAD (follows with cardiology), CKD, rectal prolapse, migraine, hypothyroidism, osteopenia,   Depression, PHQ-9: Based on the patients  [facility] Visit from 11/30/2021 in [place]  PHQ-9 Total Score 5      score we have .  Past Medical History:  Diagnosis Date   Anemia    Anginal pain ([place])    Arthritis    "back, arms, hips; hands" (11/30/2015)   Benign hypertensive heart and kidney disease with diastolic CHF, NYHA class II and CKD stage III ([place])    Blood transfusion 1972   "after daughter born, attempted to give me blood; couldn't give it cause my blood was cold" (09/23/2013)   CAD (coronary artery disease)    a. LHC 08/23/11: dLM 40-50%, oRI 40%, oCFX 40%, oD1 70% (small and not amenable to PCI).  LM lesion did not appear to be flow limiting.  Medical rx was recommended;  b. Echo 08/23/11: mild LVH, EF 60-65%, grade 1 diast dysfxn, mild BAE, PASP 24;  c. 02/2012  Cath: LM 50d, LAD 50p, D1 70ost, RI 70p, RCA ok->Med Rx;  d. 01/2013 Cardiolite: EF 88, no ischemia/infarct.   Carotid stenosis    a. dopplers 10/12:  0-39% bilat ICA;  b. 10/2012 U/S: 0-39% bilat, f/u 1 yr (10/2013).   Chronic bronchitis    Chronotropic incompetence 05/01/2021   Depression    Diverticulosis of colon (without mention of hemorrhage)    GERD (gastroesophageal reflux disease)    Heart murmur, systolic    2-D echo in December 2011 showed a normal EF with grade 1 diastolic dysfunction, trivial pulmonary regurgitation and mildly elevated PA pressure at 37 mmHg probably secondary to her COPD.dynamic obstruction-mid cavity obliteration;  b. 02/2012 Echo: EF 60-65%, mild LVH, PASP 34mHg.   History of stomach ulcers 1970's   Hyperlipidemia    Hypertension     Hypothyroidism    Migraines    a. Next atypical symptoms in the past. Patient was started on Neurontin for possible neuropathic origin of her pain.   Polymyalgia rheumatica (HCC)    Premature atrial contraction 05/01/2021   She remains asymptomatic.  No medication indicated.   Shortness of breath on exertion    "just related to angina >1 yr ago" (09/23/2013)   Sinus arrhythmia    Solitary pulmonary nodule 12/15/2006   CT Chest in January 2008  IMPRESSION:  1. Stable CT of the chest with mild biapical scarring and scattered nodularity, most likely postinflammatory in the absence of a history of malignancy.  2. No acute chest findings are demonstrated.  3. Unless the patient has a history of malignancy or risk factors for lung cancer, the chest findings do not necessarily require any specific followup. If follow    Type II diabetes mellitus ([place]    a. On oral hypoglycemic agents.   Review of Systems:   See HPI  Physical Exam: Vitals:   12/09/22 0931 12/09/22 0932  BP:  (!) 130/55  Pulse:  72  Temp:  97.9 F (36.6 C)  TempSrc:  Oral  SpO2:  100%  Weight: 130 lb (59 kg)   Height: '5\' 4"'$  (1.626 m)    General: nad HEENT: Conjunctiva nl , antiicteric sclerae, moist mucous membranes, no exudate or erythema  Cardiovascular: Normal rate, regular rhythm.  No murmurs, rubs, or gallops Pulmonary : Equal breath sounds, No wheezes, rales, or rhonchi Abdominal: soft, nontender,  bowel sounds present Ext: No edema in lower extremities, no tenderness to palpation of lower extremities.   Assessment & Plan:   See Encounters Tab for problem based charting.  Patient seen with Dr. Jimmye Norman

## 2022-12-09 NOTE — Assessment & Plan Note (Addendum)
Blood pressure typically above goal. Today it is 130/55. She has been doing well.   Would not pursue aggressive BP control in this patient given advanced age. She does have CKD stage 3 and is on ARB so will need to monitor K periodically. Can consider checking this at next visit.  Continue amlodipine and valsartan

## 2022-12-09 NOTE — Assessment & Plan Note (Addendum)
Patient has not had headache in several weeks. Doing well today.  Patient has been on imipramine for many years. She has tolerated medication well. It is listed on BEERS list so alternative medication could be considered. No changes to regimen at this time.  Continue imipramine

## 2022-12-09 NOTE — Assessment & Plan Note (Signed)
Patient reported increased breakthrough episodes at last visit. She forgot to increase her Pepcid to BID dosing. She has been using Tums instead which control symptoms.   Patient can continue using tums if they are controlling symptoms, although she was instructed to double her pepcid at last visit. Encourage patient to stick with one treatment path.

## 2022-12-09 NOTE — Patient Instructions (Signed)
Dear Mrs. Rathje,  Thank you for trusting [place] with your care today.   We would like for you to continue taking your medicines as you have been. Keep checking your blood sugars. Please return in 3 months.

## 2022-12-11 ENCOUNTER — Other Ambulatory Visit: Payer: Self-pay | Admitting: Dietician

## 2022-12-11 DIAGNOSIS — E119 Type 2 diabetes mellitus without complications: Secondary | ICD-10-CM

## 2022-12-11 NOTE — Telephone Encounter (Signed)
Needs new meter.

## 2022-12-13 MED ORDER — ONETOUCH VERIO REFLECT W/DEVICE KIT: PACK | 1 refills | Status: AC

## 2022-12-13 MED ORDER — ONETOUCH VERIO VI STRP: ORAL_STRIP | 5 refills | Status: DC

## 2022-12-13 MED ORDER — ONETOUCH DELICA PLUS LANCING MISC: 5 refills | Status: DC

## 2022-12-18 NOTE — Telephone Encounter (Signed)
She called, because she is having problems obtaining her from the pharmacy. Same meter was left at the front office for her to pick it up. She was informed that she to get th strips from her pharmacy.

## 2022-12-18 NOTE — Progress Notes (Signed)
Internal Medicine Clinic Attending  I saw and evaluated the patient.  I personally confirmed the key portions of the history and exam documented by Dr. Elliot Gurney and I reviewed pertinent patient test results.  The assessment, diagnosis, and plan were formulated together and I agree with the documentation in the resident's note. Medications were reviewed and will be discussed further.

## 2023-01-07 DIAGNOSIS — H6123 Impacted cerumen, bilateral: Secondary | ICD-10-CM | POA: Diagnosis not present

## 2023-01-13 ENCOUNTER — Telehealth: Payer: Self-pay

## 2023-01-13 NOTE — Telephone Encounter (Signed)
Prior Authorization for patient (Fluticasone-Salmeterol) came through on cover my meds was submitted with last office notes awaiting approval or denial

## 2023-01-13 NOTE — Telephone Encounter (Signed)
Decision:Denied Denied. This drug used for OTHER SEASONAL ALLERGIC RHINITIS is not an approved use. Medicare Part D rules states the drug must be used for a "medically-accepted indication". A "medically-accepted indication" means a use that is approved by the Food and Drug Administration (FDA), or a use supported by specific resources. These are the [facility] Formulary Service Drug Information and [employer]. Therefore, this drug cannot be covered under your Medicare Part D benefit.

## 2023-01-14 ENCOUNTER — Other Ambulatory Visit: Payer: Self-pay | Admitting: Internal Medicine

## 2023-01-22 ENCOUNTER — Telehealth: Payer: Self-pay

## 2023-01-22 NOTE — Patient Outreach (Signed)
  Care Coordination   Follow Up Visit Note   01/22/2023 Name: Christine [name] MRN: [id number] DOB: 07/10/1933  Christine [name] is a 89 y.o. year old female who sees [name], MD for primary care. I spoke with  [name] by phone today.  What matters to the patients health and wellness today?  Patient concerned about her inhaler being denied by Medicare Part D.    Goals Addressed               This Visit's Progress     "My insurance denied my inhaler" (pt-stated)        Care Coordination Interventions: Reviewed medications with patient and discussed patients medication denial (Fluticasone-Salmeterol Aero Pow Br. Act 250). Collaborated with Kerin Perna, [place] regarding the letter received from [facility].   Message sent to Dr. Peyton Najjar for instruction if she wants to change Rx or if we should appeal.          SDOH assessments and interventions completed:  Yes  SDOH Interventions Today    Flowsheet Row Most Recent Value  SDOH Interventions   Food Insecurity Interventions Intervention Not Indicated  Housing Interventions Intervention Not Indicated        Care Coordination Interventions:  Yes, provided   Follow up plan:  Await response from PCP and contact patient.    Encounter Outcome:  Pt. Visit Completed

## 2023-01-28 ENCOUNTER — Telehealth: Payer: Self-pay

## 2023-01-28 NOTE — Patient Outreach (Signed)
  Care Coordination   Follow Up Visit Note   01/28/2023 Name: Christine [name] MRN: [id number] DOB: 1933-03-12  Christine [name] is a 89 y.o. year old female who sees [name], MD for primary care. I spoke with  [name] by phone today.  What matters to the patients health and wellness today?  I have not heard about my inhaler from Dr. Daryll Drown.  I also need refill on my T#3    Goals Addressed               This Visit's Progress     "My insurance denied my inhaler" (pt-stated)        Care Coordination Interventions: Reviewed medications with patient and discussed patients medication denial (Fluticasone-Salmeterol Aero Pow Br. Act 250). Collaborated with Kerin Perna, [place] regarding the letter received from [facility].   Message sent to Dr. Daryll Drown for instruction if she wants to change Rx or if we should appeal. 01/28/23- Incoming call from patient regarding her inhaler and she also wants a refill on her Tylenol with codeine.  PCP has not responded on inhaler so message sent to Triage Nurse Pool to see if they can assist with patient request.          SDOH assessments and interventions completed:  Yes  SDOH Interventions Today    Flowsheet Row Most Recent Value  SDOH Interventions   Food Insecurity Interventions Intervention Not Indicated  Housing Interventions Intervention Not Indicated        Care Coordination Interventions:  Yes, provided   Follow up plan:  Follow up on inhaler denial    Encounter Outcome:  Pt. Visit Completed

## 2023-01-30 ENCOUNTER — Telehealth: Payer: Self-pay

## 2023-01-30 NOTE — Patient Outreach (Signed)
  Care Coordination   Collaboration  Visit Note   01/30/2023 Name: Christine [name] MRN: [id number] DOB: 1933-08-28  Christine [name] is a 88 y.o. year old female who sees [name], MD for primary care. I spoke with  Christine [name] by phone today.  What matters to the patients health and wellness today?  Inhaler and pain medicine    Goals Addressed               This Visit's Progress     COMPLETED: "My insurance denied my inhaler" (pt-stated)        Care Coordination Interventions: Reviewed medications with patient and discussed patients medication denial (Fluticasone-Salmeterol Aero Pow Br. Act 250). Collaborated with Kerin Perna, [place] regarding the letter received from [facility].   Message sent to Dr. Daryll Drown for instruction if she wants to change Rx or if we should appeal. 01/28/23- Incoming call from patient regarding her inhaler and she also wants a refill on her Tylenol with codeine.  PCP has not responded on inhaler so message sent to Triage Nurse Pool to see if they can assist with patient request. Collaborated with Dr. Daryll Drown who requested patient make an appointment to come in and see her or a resident about her inhaler.  Dr. Daryll Drown also approved patient's refill of her Tylenol #3.  Called and notified patient.          SDOH assessments and interventions completed:  Yes  SDOH Interventions Today    Flowsheet Row Most Recent Value  SDOH Interventions   Food Insecurity Interventions Intervention Not Indicated  Housing Interventions Intervention Not Indicated        Care Coordination Interventions:  Yes, provided   Follow up plan: No further intervention required.   Encounter Outcome:  Pt. Visit Completed

## 2023-02-06 ENCOUNTER — Encounter: Payer: Self-pay | Admitting: Internal Medicine

## 2023-02-10 ENCOUNTER — Telehealth: Payer: Self-pay | Admitting: *Deleted

## 2023-02-10 NOTE — Progress Notes (Signed)
  Care Coordination   Note   02/10/2023 Name: Christine [name] MRN: [id number] DOB: 06/07/1933  Christine [name] is a 89 y.o. year old female who sees [name], MD for primary care. I reached out to [name] by phone today to offer care coordination services.  [name] was given information about Care Coordination services today including:   The Care Coordination services include support from the care team which includes your Nurse Coordinator, Clinical Social Worker, or Pharmacist.  The Care Coordination team is here to help remove barriers to the health concerns and goals most important to you. Care Coordination services are voluntary, and the patient may decline or stop services at any time by request to their care team member.   Care Coordination Consent Status: Patient agreed to services and verbal consent obtained.   Follow up plan:  Telephone appointment with care coordination team member scheduled for:  02/13/23  Encounter Outcome:  Pt. Scheduled  [facility]  Direct Dial: [phone]

## 2023-02-11 ENCOUNTER — Ambulatory Visit (INDEPENDENT_AMBULATORY_CARE_PROVIDER_SITE_OTHER): Payer: Medicare Other | Admitting: Internal Medicine

## 2023-02-11 ENCOUNTER — Encounter: Payer: Self-pay | Admitting: Internal Medicine

## 2023-02-11 ENCOUNTER — Other Ambulatory Visit: Payer: Self-pay

## 2023-02-11 VITALS — BP 125/52 | HR 83 | Temp 98.0°F | Ht 64.0 in | Wt 128.8 lb

## 2023-02-11 DIAGNOSIS — J302 Other seasonal allergic rhinitis: Secondary | ICD-10-CM

## 2023-02-11 DIAGNOSIS — M19042 Primary osteoarthritis, left hand: Secondary | ICD-10-CM

## 2023-02-11 DIAGNOSIS — M790 Rheumatism, unspecified: Secondary | ICD-10-CM | POA: Diagnosis not present

## 2023-02-11 DIAGNOSIS — M19041 Primary osteoarthritis, right hand: Secondary | ICD-10-CM | POA: Diagnosis not present

## 2023-02-11 DIAGNOSIS — J984 Other disorders of lung: Secondary | ICD-10-CM | POA: Diagnosis not present

## 2023-02-11 DIAGNOSIS — S76111A Strain of right quadriceps muscle, fascia and tendon, initial encounter: Secondary | ICD-10-CM | POA: Diagnosis not present

## 2023-02-11 MED ORDER — ACETAMINOPHEN-CODEINE 300-30 MG PO TABS: 0.5000 | ORAL_TABLET | Freq: Three times a day (TID) | ORAL | 0 refills | Status: DC | PRN

## 2023-02-11 MED ORDER — FLUTICASONE-SALMETEROL 250-50 MCG/ACT IN AEPB: INHALATION_SPRAY | RESPIRATORY_TRACT | 3 refills | Status: DC

## 2023-02-11 NOTE — Patient Instructions (Addendum)
Christine [name] -   For your thigh pain, we will check an ESR.  I am going to attach exercises for you to try.  You can try to go to [employer] at the [facility] as well.   I have refilled your tylenol #3.  Try to alternate with the naproxen.   I have refilled your Advair.   Please schedule your CT scan of the lungs.   Thank you!

## 2023-02-11 NOTE — Progress Notes (Unsigned)
Established Patient Office Visit  Subjective   [name], female    DOB: 1933/11/03  Age: 89 y.o. MRN: [id number]  Chief Complaint  Patient presents with   Right thigh/leg pain    Christine [name] presents today for thigh pain.  She notes 2 episodes of acute thigh pain, sharp, felt like tensing and throbbing, in the anterior compartment of the thigh both lateral during the first episode and medial during the second, always on the right leg.  Pain was about 5/10.  These did occur while she was straining to go to the bathroom.  Her daughter was concerned that this was a recurrence of her PMR.  Christine [name] notes that it felt different than when she has her PMR flares.  She has not been doing her exercises for rectal prolapse and she has not been having an easy time going to the bathroom.    Recently, her inhaler was declined by her insurance.  I reviewed her PFTs and it was noted that she had a low DLCO consistent with interstitial lung disease, possibly related to her rheumatic disease (PMR)?Marland Kitchen        Review of Systems  Constitutional:  Negative for chills, fever, malaise/fatigue and weight loss.  Cardiovascular:  Negative for chest pain.  Gastrointestinal:  Positive for constipation. Negative for heartburn and nausea.  Genitourinary:  Negative for dysuria.  Musculoskeletal:  Positive for myalgias. Negative for back pain, joint pain and neck pain.  Neurological:  Negative for weakness and headaches.      Objective:     BP (!) 125/52 (BP Location: Left Arm, Patient Position: Sitting, Cuff Size: Normal)   Pulse 83   Temp 98 F (36.7 C) (Oral)   Ht 5\' 4"  (1.626 m)   Wt 128 lb 12.8 oz (58.4 kg)   LMP 02/11/1975 (Approximate)   SpO2 100% Comment: RA  BMI 22.11 kg/m    Physical Exam Vitals and nursing note reviewed.  Constitutional:      Appearance: Normal appearance.  HENT:     Head: Normocephalic and atraumatic.  Cardiovascular:     Rate and Rhythm: Normal rate.      Heart sounds: No murmur heard. Pulmonary:     Effort: Pulmonary effort is normal. No respiratory distress.  Abdominal:     General: Abdomen is flat.     Palpations: Abdomen is soft.  Musculoskeletal:        General: No swelling, tenderness (no tenderness on quadriceps, no swelling) or signs of injury.  Skin:    General: Skin is warm and dry.     Findings: No lesion or rash.  Neurological:     General: No focal deficit present.     Mental Status: She is alert and oriented to person, place, and time.  Psychiatric:        Mood and Affect: Mood normal.        Behavior: Behavior normal.      Results for orders placed or performed in visit on 02/11/23  Sed Rate (ESR)  Result Value Ref Range   Sed Rate 27 0 - 40 mm/hr        Assessment & Plan:   Problem List Items Addressed This Visit       Unprioritized   Allergic rhinitis (Chronic)   Rheumatic disease - Primary    I am concerned, looking at her PFTs, that she might have ILD related to her rheumatic disease.  One option for her breathing is  to continue the ICS, so I sent in a new Rx for this.  I also discussed getting a HRCT of the lungs, and they were amenable to this plan  HRCT of the lung.       Relevant Medications   fluticasone-salmeterol (ADVAIR DISKUS) 250-50 MCG/ACT AEPB   acetaminophen-codeine (TYLENOL #3) 300-30 MG tablet   Other Relevant Orders   Sed Rate (ESR) (Completed)   CT Chest High Resolution   Primary osteoarthritis of both hands   Relevant Medications   acetaminophen-codeine (TYLENOL #3) 300-30 MG tablet   Quadriceps muscle strain    I think her findings may be related to straining to use the restroom and muscle weakness.  She has a normal ESR, so no steroids are needed at this time.  Gave her some stretching/strengthening exercises to try.       Other Visit Diagnoses     Restrictive lung disease       Relevant Medications   fluticasone-salmeterol (ADVAIR DISKUS) 250-50 MCG/ACT AEPB    Other Relevant Orders   CT Chest High Resolution       Return in about 3 months (around 05/14/2023).    Gilles Chiquito, MD

## 2023-02-12 ENCOUNTER — Telehealth: Payer: Self-pay

## 2023-02-12 LAB — SEDIMENTATION RATE: Sed Rate: 27 mm/hr (ref 0–40)

## 2023-02-12 NOTE — Telephone Encounter (Signed)
Prior Authorization for patient (Wixela inhub) came through on cover my meds was submitted with last office notes awaiting approval or denial

## 2023-02-12 NOTE — Telephone Encounter (Addendum)
Decision:Approved Joely Lahens (Key: [id number]) PA Case ID #: [id number] Rx #: [id number] Need Help? Call us at [phone] Outcome Approved today Approved. This drug has been approved under the Member's Medicare Part D benefit. Approved quantity: 60 units per 30 day(s). You may fill up to a 90 day supply except for those on Specialty Tier 5, which can be filled up to a 30 day supply. Please call the pharmacy to process the prescription claim. Authorization Expiration Date: 11/17/2098 Drug Wixela Inhub 250-50MCG/ACT aerosol powder ePA cloud logo Form [facility] Medicare Electronic Prior Authorization Request Form [id number] NCPDP) [place] aware of approval

## 2023-02-13 ENCOUNTER — Ambulatory Visit: Payer: Self-pay | Admitting: *Deleted

## 2023-02-13 ENCOUNTER — Encounter: Payer: Self-pay | Admitting: Internal Medicine

## 2023-02-13 DIAGNOSIS — S76119A Strain of unspecified quadriceps muscle, fascia and tendon, initial encounter: Secondary | ICD-10-CM | POA: Insufficient documentation

## 2023-02-13 NOTE — Assessment & Plan Note (Signed)
I am concerned, looking at her PFTs, that she might have ILD related to her rheumatic disease.  One option for her breathing is to continue the ICS, so I sent in a new Rx for this.  I also discussed getting a HRCT of the lungs, and they were amenable to this plan  HRCT of the lung.

## 2023-02-13 NOTE — Patient Outreach (Signed)
  Care Coordination   Initial Visit Note Potential Health Equity Plan Participant   02/13/2023 Name: Christine [name] MRN: [id number] DOB: 1933/06/09  Christine [name] is a 89 y.o. year old female who sees Christine Falcon, MD for primary care. NP called pt home to ensure she was expecting me. Pt daughter, Christine [name], answered the phone and she said she did not realize this was a scheduled home visit. Her doctors office told her we are not making home visits. NP explained the [place] and advised of the priorities of the program. Christine [name] consented to a home visit to her mother or a phone call at another date.   What matters to the patients health and wellness today?   None discussed today   Goals Addressed   None     SDOH assessments and interventions completed:  Yes    Care Coordination Interventions:  Yes, provided   Follow up plan: A home visit or telephone assessment will be scheduled per daughter's request and a convenient time.  Encounter Outcome:  Pt. Visit Completed   Christine Memos C. Myrtie Neither, MSN, [facility] Gerontological Nurse Practitioner [facility] Care Management [phone]

## 2023-02-13 NOTE — Assessment & Plan Note (Signed)
I think her findings may be related to straining to use the restroom and muscle weakness.  She has a normal ESR, so no steroids are needed at this time.  Gave her some stretching/strengthening exercises to try.

## 2023-02-18 ENCOUNTER — Other Ambulatory Visit: Payer: Self-pay | Admitting: Student

## 2023-02-18 DIAGNOSIS — I10 Essential (primary) hypertension: Secondary | ICD-10-CM

## 2023-02-28 ENCOUNTER — Telehealth: Payer: Self-pay | Admitting: *Deleted

## 2023-02-28 ENCOUNTER — Encounter: Payer: Self-pay | Admitting: *Deleted

## 2023-02-28 NOTE — Patient Outreach (Signed)
  Care Coordination   Initial Visit Note POTENTIAL HEALTH EQUITY PLAN PARTICIPANT   02/28/2023 Name: Christine [name] MRN: [id number] DOB: 1933-07-04  Christine [name] is a 89 y.o. year old female who sees Christine Catalina, MD for primary care. I spoke with  Christine [name] by phone today. SHE WOULD LIKE ME TO SPEAK WITH HER DAUGHTER Christine [name] BEFORE SHE OFFICIALLY ENROLLS IN THE PROGRAM.  What matters to the patients health and wellness today?  STAYING AS HEALTHY AS I CAN BE AT 89!    Goals Addressed   None     SDOH assessments and interventions completed:  Yes{THN Tip this will not be part of the note when signed-REQUIRED REPORT FIELD DO NOT DELETE (Optional):27901}  SDOH Interventions Today    Flowsheet Row Most Recent Value  SDOH Interventions   Food Insecurity Interventions Intervention Not Indicated  Housing Interventions Intervention Not Indicated  Transportation Interventions Intervention Not Indicated  Utilities Interventions Intervention Not Indicated  Alcohol Usage Interventions Intervention Not Indicated (Score <7)  Financial Strain Interventions Intervention Not Indicated  Physical Activity Interventions Intervention Not Indicated  [ENCOURAGED DAILY PEDALING WITH HER FAVORITE TV PROGRAM]  Stress Interventions Intervention Not Indicated        Care Coordination Interventions:  Yes, provided {THN Tip this will not be part of the note when signed-REQUIRED REPORT FIELD DO NOT DELETE (Optional):27901}  Follow up plan: {CCFOLLOWUP:27768}   Encounter Outcome:  Pt. Visit Completed {THN Tip this will not be part of the note when signed-REQUIRED REPORT FIELD DO NOT DELETE (Optional):27901}  Christine [name] C. Burgess Estelle, MSN, [facility] Gerontological Nurse Practitioner [facility] Care Management [phone]

## 2023-03-03 NOTE — Patient Outreach (Deleted)
  Care Coordination  Potential Health Equity Plan Patient  03/03/2023 Name: Christine [name] MRN: [id number] DOB: [birth year]   Care Coordination Outreach Attempts:  An unsuccessful telephone outreach was attempted today to offer the patient information about available care coordination services as a benefit of their health plan.   Follow Up Plan:  Additional outreach attempts will be made to offer the patient care coordination information and services.   Encounter Outcome:  No Answer   Care Coordination Interventions:  Yes - need to call pt daughter for approval of pt to participate.    SIG Jareli Highland C. Burgess Estelle, MSN, [facility] Gerontological Nurse Practitioner [facility] Care Management [phone]

## 2023-03-03 NOTE — Patient Outreach (Signed)
  Care Coordination  Potential [facility] Health Equity Plan Patient  03/03/2023 Name: Christine [name] MRN: [id number] DOB: [birth year]   Care Coordination Outreach Attempts:  An unsuccessful telephone outreach was attempted today to offer the patient information about available care coordination services as a benefit of their health plan.   Follow Up Plan:  Additional outreach attempts will be made to offer the patient care coordination information and services.   Encounter Outcome:  No Answer   Care Coordination Interventions:  No, not indicated    Margurette Brener C. Burgess Estelle, MSN, [facility] Gerontological Nurse Practitioner [facility] Care Management [phone]

## 2023-03-07 ENCOUNTER — Encounter: Payer: Medicare Other | Admitting: Internal Medicine

## 2023-03-10 ENCOUNTER — Other Ambulatory Visit: Payer: Self-pay | Admitting: Dietician

## 2023-03-10 ENCOUNTER — Encounter: Payer: Self-pay | Admitting: Internal Medicine

## 2023-03-10 ENCOUNTER — Ambulatory Visit: Payer: Self-pay | Admitting: *Deleted

## 2023-03-10 DIAGNOSIS — E119 Type 2 diabetes mellitus without complications: Secondary | ICD-10-CM

## 2023-03-10 NOTE — Patient Outreach (Signed)
  Care Coordination   Follow Up Visit Note   03/10/2023 Name: Christine [name] MRN: [id number] DOB: Apr 08, 1933  Christine [name] is a 89 y.o. year old female who sees Christine Catalina, MD for primary care. I spoke with [facility] daughter of Christine [name] by phone today.  What matters to the patients health and wellness today?  Per daughter, patient has been having headaches, message sent in MyChart to PCP, she is waiting on follow up call. Advised per chart, office will call to discuss advice.  Daughter state she does not have time for this RNCM assessment today, request all back.     SDOH assessments and interventions completed:  No     Care Coordination Interventions:  No, not indicated   Follow up plan: Follow up call scheduled for 4/26    Encounter Outcome:  Pt. Visit Completed   Christine Durie, RN, MSN, [facility] Care Management Care Management Coordinator [phone]

## 2023-03-11 ENCOUNTER — Encounter: Payer: Medicare Other | Admitting: Internal Medicine

## 2023-03-11 NOTE — Telephone Encounter (Signed)
Needs rx for lancets 

## 2023-03-12 ENCOUNTER — Encounter: Payer: Medicare Other | Admitting: Internal Medicine

## 2023-03-12 MED ORDER — ONETOUCH DELICA PLUS LANCET33G MISC: 11 refills | Status: DC

## 2023-03-14 ENCOUNTER — Encounter: Payer: Medicare Other | Admitting: Internal Medicine

## 2023-03-14 ENCOUNTER — Ambulatory Visit: Payer: Self-pay | Admitting: *Deleted

## 2023-03-14 NOTE — Patient Instructions (Signed)
Visit Information  Thank you for taking time to visit with me today. Please don't hesitate to contact me if I can be of assistance to you before our next scheduled telephone appointment.  Following are the goals we discussed today:  Remember virtual appointment with PCP on 5/7 and in person visit on 6/25. Continue monitoring blood sugars and watching sugar intake.   Our next appointment is by telephone on 7/3  Please call the care guide team at [phone] if you need to cancel or reschedule your appointment.   Please call the Suicide and Crisis Lifeline: 988 call the [place] National Suicide Prevention Lifeline: 608-745-4000 or TTY: 970-481-3273 TTY (859)010-0363) to talk to a trained counselor call 1-800-273-TALK (toll free, 24 hour hotline) call 911 if you are experiencing a Mental Health or Behavioral Health Crisis or need someone to talk to.  Patient verbalizes understanding of instructions and care plan provided today and agrees to view in MyChart. Active MyChart status and patient understanding of how to access instructions and care plan via MyChart confirmed with patient.     The patient has been provided with contact information for the care management team and has been advised to call with any health related questions or concerns.   Kemper Durie, RN, MSN, [facility] Care Management Care Management Coordinator [phone]

## 2023-03-14 NOTE — Patient Outreach (Signed)
Care Coordination   Follow Up Visit Note ACO Reach HEP   03/14/2023 Name: Christine [name] MRN: [id number] DOB: [birth year]  Christine [name] is a 89 y.o. year old female who sees Christine Catalina, MD for primary care. I spoke with Christine [name], daughter of Christine [name] by phone today.  What matters to the patients health and wellness today?  Keep blood sugars controlled.     Goals Addressed             This Visit's Progress    Effective management of DM       Care Coordination Interventions: Provided education to patient about basic DM disease process Reviewed medications with patient and discussed importance of medication adherence Counseled on importance of regular laboratory monitoring as prescribed Discussed plans with patient for ongoing care management follow up and provided patient with direct contact information for care management team Provided patient with written educational materials related to hypo and hyperglycemia and importance of correct treatment         SDOH assessments and interventions completed:  Yes  SDOH Interventions    Flowsheet Row Telephone from 02/28/2023 in [facility] [employer] [facility] Care Coordination Telephone from 01/30/2023 in [facility] [employer] Care Coordination Telephone from 01/28/2023 in [facility] [employer] [facility] Care Coordination Telephone from 01/22/2023 in [facility] [employer] [facility] Care Coordination Telephone from 10/21/2022 in [facility] [employer] [facility] Care Coordination Telephone from 10/16/2022 in [facility] [employer] [facility] Care Coordination  SDOH Interventions        Food Insecurity Interventions Intervention Not Indicated Intervention Not Indicated Intervention Not Indicated Intervention Not Indicated Intervention Not Indicated --  Housing Interventions Intervention Not Indicated Intervention Not Indicated Intervention Not Indicated Intervention Not Indicated Intervention Not Indicated --   Transportation Interventions Intervention Not Indicated -- -- -- -- Intervention Not Indicated  Utilities Interventions Intervention Not Indicated -- -- -- -- Intervention Not Indicated  Alcohol Usage Interventions Intervention Not Indicated (Score <7) -- -- -- -- --  Financial Strain Interventions Intervention Not Indicated -- -- -- -- --  Physical Activity Interventions Intervention Not Indicated  [ENCOURAGED DAILY PEDALING WITH HER FAVORITE TV PROGRAM] -- -- -- -- --  Stress Interventions Intervention Not Indicated -- -- -- -- --           Care Coordination Interventions:  Yes, provided   Interventions Today    Flowsheet Row Most Recent Value  Chronic Disease   Chronic disease during today's visit Diabetes  General Interventions   General Interventions Discussed/Reviewed General Interventions Reviewed, Annual Eye Exam, Labs, Annual Foot Exam, Doctor Visits  Labs Hgb A1c every 3 months  [Current A1C is 7.2, will have repeat in June]  Doctor Visits Discussed/Reviewed Doctor Visits Discussed, PCP, Annual Wellness Visits  [AWV done 1/22, virtual PCP on 5/7, office PCP on 6/25]  PCP/Specialist Visits Compliance with follow-up visit  Exercise Interventions   Exercise Discussed/Reviewed Exercise Reviewed  Eather Colas that exercise helps to decrease blood sugar]  Education Interventions   Education Provided Provided Education  Provided Verbal Education On Nutrition, Foot Care, Eye Care, Labs, Blood Sugar Monitoring, When to see the doctor, Medication, Exercise  [blood sugars usually less then 150 unless on prednisone]  Labs Reviewed Hgb A1c  Nutrition Interventions   Nutrition Discussed/Reviewed Nutrition Reviewed, Decreasing sugar intake, Adding fruits and vegetables  Jorje Guild with dietician at PCP office]  Pharmacy Interventions   Pharmacy Dicussed/Reviewed Pharmacy Topics Reviewed, Medications and their functions, Medication Adherence, Affording Medications  Follow up plan:  Follow up call scheduled for 7/8   Daughter declines offer for home visit.  Encounter Outcome:  Pt. Visit Completed   Christine Durie, RN, MSN, [facility] Care Management Care Management Coordinator [phone]

## 2023-03-17 NOTE — Telephone Encounter (Signed)
Patient notified that her Demetra Shiner are already at her pharmacy.

## 2023-03-20 ENCOUNTER — Ambulatory Visit (HOSPITAL_COMMUNITY): Payer: Medicare Other | Attending: Internal Medicine

## 2023-03-25 ENCOUNTER — Ambulatory Visit (INDEPENDENT_AMBULATORY_CARE_PROVIDER_SITE_OTHER): Payer: Medicare Other | Admitting: Internal Medicine

## 2023-03-25 DIAGNOSIS — R519 Headache, unspecified: Secondary | ICD-10-CM

## 2023-03-25 NOTE — Progress Notes (Signed)
[facility] Telephone Encounter Continuity Care Appointment  HPI:  This telephone encounter was created for [name] on 03/25/2023 for the following purpose/cc headaches and sinus pain. No headache since last week.  She was having them everyday, she only took tylenol and they improved.  She feels like she is doing okay, she has no complaints today.  She is aware of her appointment in June.    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 LHC 08/23/11: dLM 40-50%, oRI 40%, oCFX 40%, oD1 70% (small and not amenable to PCI).  LM lesion did not appear to be flow limiting.  Medical rx was recommended;  b. Echo 08/23/11: mild LVH, EF 60-65%, grade 1 diast dysfxn, mild BAE, PASP 24;  c. 02/2012  Cath: LM 50d, LAD 50p, D1 70ost, RI 70p, RCA ok->Med Rx;  d. 01/2013 Cardiolite: EF 88, no ischemia/infarct.   Carotid stenosis    a. dopplers 10/12:  0-39% bilat ICA;  b. 10/2012 U/S: 0-39% bilat, f/u 1 yr (10/2013).   Chronic bronchitis    Chronotropic incompetence 05/01/2021   Depression    Diverticulosis of colon (without mention of hemorrhage)    GERD (gastroesophageal reflux disease)    Heart murmur, systolic    2-D echo in December 2011 showed a normal EF with grade 1 diastolic dysfunction, trivial pulmonary regurgitation and mildly elevated PA pressure at 37 mmHg probably secondary to her COPD.dynamic obstruction-mid cavity obliteration;  b. 02/2012 Echo: EF 60-65%, mild LVH, PASP .   History of stomach ulcers 1970's   Hyperlipidemia    Hypertension    Hypothyroidism    Migraines     a. Next atypical symptoms in the past. Patient was started on Neurontin for possible neuropathic origin of her pain.   Polymyalgia rheumatica (HCC)    Premature atrial contraction 05/01/2021   She remains asymptomatic.  No medication indicated.   Shortness of breath on exertion    "just related to angina >1 yr ago" (09/23/2013)   Sinus arrhythmia    Solitary pulmonary nodule 12/15/2006   CT Chest in January 2008  IMPRESSION:  1. Stable CT of the chest with mild biapical scarring and scattered nodularity, most likely postinflammatory in the absence of a history of malignancy.  2. No acute chest findings are demonstrated.  3. Unless the patient has a history of malignancy or risk factors for lung cancer, the chest findings do not necessarily require any specific followup. If follow    Type II diabetes mellitus (HCC)    a. On oral hypoglycemic agents.     ROS:  No complaints, negative HPI   Assessment / Plan / Recommendations:  Please see A&P under problem oriented charting for assessment of the patient's acute and chronic medical conditions.  As always, pt is advised that if symptoms worsen or new symptoms arise, they should go to an urgent care facility or to to ER for further evaluation.   Consent and Medical Decision Making:  This is a telephone encounter between [employer]  and Debe Coder on 03/25/2023 for Headaches. The visit was conducted with the patient located at home and Debe Coder at [facility]. The patient's identity was confirmed using their DOB and current address. The patient has consented to being evaluated through a telephone encounter and understands the associated risks (an examination cannot be done and the patient may need to come in for an appointment) / benefits (allows the patient to remain at home, decreasing exposure to coronavirus). I personally spent 2 minutes on medical discussion.    No clinical needs identified.  Reassurance given.  Will see in clinic next month.

## 2023-03-31 ENCOUNTER — Telehealth: Payer: Self-pay

## 2023-03-31 NOTE — Telephone Encounter (Signed)
Incoming fax from pharmacy Medication: One Touch Ultra Blue Message to prescriber: "Supporting SWO Documentation Required"

## 2023-04-01 ENCOUNTER — Other Ambulatory Visit: Payer: Self-pay

## 2023-04-01 DIAGNOSIS — E119 Type 2 diabetes mellitus without complications: Secondary | ICD-10-CM

## 2023-04-01 MED ORDER — ONETOUCH VERIO VI STRP: ORAL_STRIP | 5 refills | Status: DC

## 2023-04-01 NOTE — Telephone Encounter (Signed)
Hey Dr.Mullen, there wasn't any further detail that's all the pharmacy faxed over.

## 2023-04-01 NOTE — Telephone Encounter (Signed)
Medicare is requiring a DWO for One Touch Verio Reflect test strips. Call placed to [facility] to request form be faxed to [facility]. States they will work on getting this faxed to [place] as soon as possible.

## 2023-04-01 NOTE — Telephone Encounter (Signed)
What type of documentation needed?  In box?

## 2023-04-07 ENCOUNTER — Ambulatory Visit (HOSPITAL_COMMUNITY)
Admission: RE | Admit: 2023-04-07 | Discharge: 2023-04-07 | Disposition: A | Discharge status: Home / Self Care | Payer: Medicare Other | Source: Ambulatory Visit | Attending: Internal Medicine | Admitting: Internal Medicine

## 2023-04-07 DIAGNOSIS — M790 Rheumatism, unspecified: Secondary | ICD-10-CM

## 2023-04-07 DIAGNOSIS — J984 Other disorders of lung: Secondary | ICD-10-CM | POA: Insufficient documentation

## 2023-04-07 DIAGNOSIS — R918 Other nonspecific abnormal finding of lung field: Secondary | ICD-10-CM | POA: Diagnosis not present

## 2023-04-08 ENCOUNTER — Telehealth: Payer: Self-pay

## 2023-04-08 NOTE — Telephone Encounter (Signed)
Pt stated that her pharmacy said that they sent over a form from [facility]  to fill for her test strips ... But it has not  been sent back to them and she has not gotten her strips yet even though they was sent to the pharmacy by PCP

## 2023-04-16 ENCOUNTER — Other Ambulatory Visit: Payer: Self-pay | Admitting: Internal Medicine

## 2023-04-16 DIAGNOSIS — J302 Other seasonal allergic rhinitis: Secondary | ICD-10-CM

## 2023-04-18 ENCOUNTER — Telehealth: Payer: Medicare Other | Admitting: Internal Medicine

## 2023-05-13 ENCOUNTER — Encounter: Payer: Medicare Other | Admitting: Internal Medicine

## 2023-05-14 ENCOUNTER — Other Ambulatory Visit: Payer: Self-pay | Admitting: Internal Medicine

## 2023-05-14 DIAGNOSIS — E039 Hypothyroidism, unspecified: Secondary | ICD-10-CM

## 2023-05-16 ENCOUNTER — Ambulatory Visit (INDEPENDENT_AMBULATORY_CARE_PROVIDER_SITE_OTHER): Payer: Medicare Other | Admitting: Internal Medicine

## 2023-05-16 ENCOUNTER — Ambulatory Visit (INDEPENDENT_AMBULATORY_CARE_PROVIDER_SITE_OTHER): Payer: Medicare Other

## 2023-05-16 ENCOUNTER — Other Ambulatory Visit: Payer: Self-pay

## 2023-05-16 ENCOUNTER — Encounter: Payer: Self-pay | Admitting: Internal Medicine

## 2023-05-16 VITALS — BP 129/70 | HR 80 | Temp 98.2°F | Ht 64.0 in | Wt 129.3 lb

## 2023-05-16 DIAGNOSIS — I10 Essential (primary) hypertension: Secondary | ICD-10-CM

## 2023-05-16 DIAGNOSIS — G43809 Other migraine, not intractable, without status migrainosus: Secondary | ICD-10-CM | POA: Diagnosis not present

## 2023-05-16 DIAGNOSIS — E119 Type 2 diabetes mellitus without complications: Secondary | ICD-10-CM | POA: Diagnosis not present

## 2023-05-16 DIAGNOSIS — K623 Rectal prolapse: Secondary | ICD-10-CM | POA: Diagnosis not present

## 2023-05-16 DIAGNOSIS — E039 Hypothyroidism, unspecified: Secondary | ICD-10-CM

## 2023-05-16 DIAGNOSIS — M316 Other giant cell arteritis: Secondary | ICD-10-CM | POA: Diagnosis not present

## 2023-05-16 DIAGNOSIS — E1122 Type 2 diabetes mellitus with diabetic chronic kidney disease: Secondary | ICD-10-CM | POA: Diagnosis not present

## 2023-05-16 DIAGNOSIS — N183 Chronic kidney disease, stage 3 unspecified: Secondary | ICD-10-CM

## 2023-05-16 DIAGNOSIS — Z0000 Encounter for general adult medical examination without abnormal findings: Secondary | ICD-10-CM | POA: Diagnosis not present

## 2023-05-16 DIAGNOSIS — I130 Hypertensive heart and chronic kidney disease with heart failure and stage 1 through stage 4 chronic kidney disease, or unspecified chronic kidney disease: Secondary | ICD-10-CM | POA: Diagnosis not present

## 2023-05-16 DIAGNOSIS — E785 Hyperlipidemia, unspecified: Secondary | ICD-10-CM

## 2023-05-16 DIAGNOSIS — Z7984 Long term (current) use of oral hypoglycemic drugs: Secondary | ICD-10-CM | POA: Diagnosis not present

## 2023-05-16 DIAGNOSIS — I5032 Chronic diastolic (congestive) heart failure: Secondary | ICD-10-CM | POA: Diagnosis not present

## 2023-05-16 LAB — POCT GLYCOSYLATED HEMOGLOBIN (HGB A1C): Hemoglobin A1C: 6.8 % — AB (ref 4.0–5.6)

## 2023-05-16 LAB — GLUCOSE, CAPILLARY: Glucose-Capillary: 116 mg/dL — ABNORMAL HIGH (ref 70–99)

## 2023-05-16 NOTE — Progress Notes (Signed)
   Established Patient Office Visit  Subjective   Patient ID: MYKAYLA HENSHALL, female    DOB: 06/26/1933  Age: 89 y.o. MRN: [id number]  Chief Complaint  Patient presents with  . Check-up Visit    Back pain - intermittent.  . Medication Refill    Ms. Nellum is an 89 year old woman with PMH of Hypothyroidism, DM2, CAD, HLD, migraine (on long term imipramine), HTN, PAT and chronotropic intolerance to beta blockers), CKD stage 2 who presents for follow up.   Ms. Weidenbach reports intermittent back pain. This comes and goes, lower back, cramping, no radiation.     Patient Active Problem List   Diagnosis Date Noted  . Primary osteoarthritis of both hands 05/08/2022  . Premature atrial contraction 05/01/2021  . Chronotropic incompetence 05/01/2021  . Gait instability 09/24/2020  . Cervical radiculopathy at C7 06/23/2020  . Chronic diastolic (congestive) heart failure (HCC) 01/13/2019  . PAT (paroxysmal atrial tachycardia) 01/13/2019  . CKD stage 3 due to type 2 diabetes mellitus (HCC) 01/13/2019  . Rectal prolapse 06/24/2018  . Temporal arteritis (HCC) 04/06/2018  . Rheumatic disease 07/29/2017  . MDD (major depressive disorder), recurrent, in full remission (HCC) 11/30/2016  . Elevated alkaline phosphatase level 09/18/2016  . Hypertensive retinopathy of both eyes, grade 2 04/20/2015  . Lumbar spondylosis 06/16/2014  . Carpal tunnel syndrome 02/18/2014  . Osteopenia 01/11/2013  . Routine adult health maintenance 01/11/2013  . Diverticulosis 09/18/2012  . Allergic rhinitis 02/13/2012  . Hyperlipidemia 11/28/2006  . Hypothyroidism 09/04/2006  . Type 2 diabetes mellitus, controlled (HCC) 09/04/2006  . Migraine 09/04/2006  . Essential hypertension 09/04/2006  . GERD 09/04/2006      ROS    Objective:     Ht 5\' 4"  (1.626 m)   Wt 129 lb 4.8 oz (58.7 kg)   LMP 02/11/1975 (Approximate)   BMI 22.19 kg/m  {Vitals History (Optional):23777}  Physical Exam   Results for orders  placed or performed in visit on 05/16/23  Glucose, capillary  Result Value Ref Range   Glucose-Capillary 116 (H) 70 - 99 mg/dL    {Labs (ONGEXBMW):41324}  The ASCVD Risk score ([name] DK, et al., 2019) failed to calculate for the following reasons:   The 2019 ASCVD risk score is only valid for ages 30 to 30    Assessment & Plan:   Problem List Items Addressed This Visit       Unprioritized   Hypothyroidism (Chronic)   Relevant Orders   TSH   Type 2 diabetes mellitus, controlled (HCC) - Primary (Chronic)   Relevant Orders   POC Hbg A1C   BMP8+Anion Gap   Hyperlipidemia (Chronic)   Relevant Orders   Lipid Profile   BMP8+Anion Gap   Temporal arteritis (HCC) (Chronic)   Rectal prolapse (Chronic)   Relevant Orders   CBC no Diff   Chronic diastolic (congestive) heart failure (HCC) (Chronic)   CKD stage 3 due to type 2 diabetes mellitus (HCC) (Chronic)   Relevant Orders   BMP8+Anion Gap   CBC no Diff    No follow-ups on file.    Debe Coder, MD

## 2023-05-16 NOTE — Patient Instructions (Signed)
Your A1C is perfect!  6.8.   We will check some blood work today.   Your rectal prolapse appears healthy, please keep taking care of yourself as you have been!  Come back in 3 months, sooner if needed.

## 2023-05-16 NOTE — Progress Notes (Signed)
Subjective:   Christine [name] is a 89 y.o. female who presents for an Initial Medicare Annual Wellness Visit.  Visit Complete: In person  Patient Medicare AWV questionnaire was completed by the patient on 05/16/2023; I have confirmed that all information answered by patient is correct and no changes since this date.  Review of Systems    Defer to PCP       Objective:    Today's Vitals   05/16/23 1131  BP: 129/70  Pulse: 80  Temp: 98.2 F (36.8 C)  TempSrc: Oral  Weight: 129 lb 4.8 oz (58.7 kg)  Height: 5\' 4"  (1.626 m)   Body mass index is 22.19 kg/m.     05/16/2023   11:32 AM 05/16/2023    9:41 AM 09/06/2022    9:35 AM 07/16/2022    9:08 AM 04/29/2022    2:08 PM 11/30/2021   12:17 PM 11/23/2021   10:12 AM  Advanced Directives  Does Patient Have a Medical Advance Directive? No No No No No No No  Would patient like information on creating a medical advance directive? No - Patient declined No - Patient declined No - Patient declined No - Patient declined No - Patient declined No - Patient declined No - Patient declined    Current Medications (verified) Outpatient Encounter Medications as of 05/16/2023  Medication Sig   acetaminophen (TYLENOL) 500 MG tablet Take 1,000 mg by mouth every 6 (six) hours as needed for headache. For pain   acetaminophen-codeine (TYLENOL #3) 300-30 MG tablet Take 0.5-1 tablets by mouth every 8 (eight) hours as needed for moderate pain.   amLODipine (NORVASC) 10 MG tablet TAKE 1 TABLET(10 MG) BY MOUTH DAILY   Ascorbic Acid (VITAMIN C PO) Take 2 tablets by mouth daily.    atorvastatin (LIPITOR) 40 MG tablet TAKE 1/2 TABLET BY MOUTH EVERY EVENING   Blood Glucose Monitoring Suppl (ONETOUCH VERIO REFLECT) w/Device KIT Check blood sugar one time daily before breakfast   cholecalciferol 25 MCG (1000 UT) tablet Take 1,000 Units by mouth daily.   clopidogrel (PLAVIX) 75 MG tablet TAKE 1 TABLET BY MOUTH EVERY EVENING   famotidine (PEPCID) 20 MG tablet Take  1 tablet (20 mg total) by mouth 2 (two) times daily. Please schedule a yearly follow up for further refills.  Thank you   fluticasone (FLONASE) 50 MCG/ACT nasal spray SHAKE LIQUID AND USE 1 SPRAY IN EACH NOSTRIL EVERY DAY   fluticasone-salmeterol (ADVAIR DISKUS) 250-50 MCG/ACT AEPB INHALE 1 PUFF BY MOUTH EVERY 12 HOURS   glimepiride (AMARYL) 1 MG tablet TAKE 1 TABLET BY MOUTH EVERY DAY BEFORE BREAKFAST   glucose blood (ONETOUCH VERIO) test strip Check blood sugar one time daily before breakfast   imipramine (TOFRANIL) 50 MG tablet TAKE 1 TABLET(50 MG) BY MOUTH TWICE DAILY   Lancet Devices (ONETOUCH DELICA PLUS LANCING) MISC Check blood sugar one time daily before breakfast   Lancets (ONETOUCH DELICA PLUS LANCET33G) MISC USE TO TEST ONCE DAILY   levothyroxine (SYNTHROID) 25 MCG tablet TAKE 1 TABLET BY MOUTH EVERY DAY   Multiple Vitamin (MULTIVITAMIN WITH MINERALS) TABS tablet Take 1 tablet by mouth daily. (Patient taking differently: Take 2 tablets by mouth daily.)   valsartan (DIOVAN) 320 MG tablet TAKE 1 TABLET(320 MG) BY MOUTH DAILY   No facility-administered encounter medications on file as of 05/16/2023.    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 LHC 08/23/11: dLM 40-50%, oRI 40%, oCFX 40%, oD1 70% (small and not amenable to PCI).  LM lesion did not appear to be flow limiting.  Medical rx was recommended;  b. Echo 08/23/11: mild LVH, EF 60-65%, grade 1 diast dysfxn, mild BAE, PASP 24;  c. 02/2012   Cath: LM 50d, LAD 50p, D1 70ost, RI 70p, RCA ok->Med Rx;  d. 01/2013 Cardiolite: EF 88, no ischemia/infarct.   Carotid stenosis    a. dopplers 10/12:  0-39% bilat ICA;  b. 10/2012 U/S: 0-39% bilat, f/u 1 yr (10/2013).   Chronic bronchitis    Chronotropic incompetence 05/01/2021   Depression    Diverticulosis of colon (without mention of hemorrhage)    GERD (gastroesophageal reflux disease)    Heart murmur, systolic    2-D echo in December 2011 showed a normal EF with grade 1 diastolic dysfunction, trivial pulmonary regurgitation and mildly elevated PA pressure at 37 mmHg probably secondary to her COPD.dynamic obstruction-mid cavity obliteration;  b. 02/2012 Echo: EF 60-65%, mild LVH, PASP .   History of stomach ulcers 1970's   Hyperlipidemia    Hypertension    Hypothyroidism    Migraines    a. Next atypical symptoms in the past. Patient was started on Neurontin for possible neuropathic origin of her pain.   Polymyalgia rheumatica (HCC)    Premature atrial contraction 05/01/2021   She remains asymptomatic.  No medication indicated.   Shortness of breath on exertion    "just related to angina >1 yr ago" (09/23/2013)   Sinus arrhythmia    Solitary pulmonary nodule 12/15/2006   CT Chest in January 2008  IMPRESSION:  1. Stable CT of the chest with mild biapical scarring and scattered nodularity, most likely postinflammatory in the absence of a history of malignancy.  2. No acute chest findings are demonstrated.  3. Unless the patient has a history of malignancy or risk factors for lung cancer, the chest findings do not necessarily require any specific followup. If follow    Type II diabetes mellitus (HCC)    a. On oral hypoglycemic agents.   Past Surgical History:  Procedure Laterality Date   CARDIAC CATHETERIZATION N/A 09/26/2015   Procedure: Right/Left Heart Cath and Coronary Angiography;  Surgeon: Laurey Morale, MD;  Location: [facility] INVASIVE CV LAB;  Service: Cardiovascular;  Laterality:  N/A;   CATARACT EXTRACTION W/ INTRAOCULAR LENS  IMPLANT, BILATERAL Bilateral 1990's   LEFT HEART CATHETERIZATION WITH CORONARY ANGIOGRAM N/A 03/16/2012   Procedure: LEFT HEART CATHETERIZATION WITH CORONARY ANGIOGRAM;  Surgeon: Herby Abraham, MD;  Location: [facility] CATH LAB;  Service: Cardiovascular;  Laterality: N/A;   RIGHT/LEFT HEART CATH AND CORONARY ANGIOGRAPHY N/A 08/12/2017   Procedure: RIGHT/LEFT HEART CATH AND CORONARY ANGIOGRAPHY;  Surgeon: [place], Peter M, MD;  Location: [facility] INVASIVE CV LAB;  Service: Cardiovascular;  Laterality: N/A;   VAGINAL HYSTERECTOMY  1976   Family History  Problem Relation Age of Onset   Colon cancer Maternal Grandmother    Heart attack Maternal Grandmother    COPD Father    Other Mother  died of unknown causes in her 34's. Pt raised by grandmother.   Pulmonary Hypertension Daughter    Social History   Socioeconomic History   Marital status: Widowed    Spouse name: Not on file   Number of children: 7   Years of education: Not on file   Highest education level: Not on file  Occupational History   Occupation: retired  Tobacco Use   Smoking status: Never   Smokeless tobacco: Never  Vaping Use   Vaping Use: Never used  Substance and Sexual Activity   Alcohol use: No    Comment: 11/29/2016 "Last drink 1967"   Drug use: No   Sexual activity: Not Currently  Other Topics Concern   Not on file  Social History Narrative   Never smoked. Lives in [place] with her dtr.  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   Social Determinants of Health   Financial Resource Strain: Low Risk  (05/16/2023)   Overall Financial Resource Strain (CARDIA)    Difficulty of Paying Living Expenses: Not very hard  Food Insecurity: No Food Insecurity (05/16/2023)   Hunger Vital Sign    Worried About Running Out of Food in the Last Year: Never true    Ran Out of Food in the Last Year: Never true  Transportation Needs: No Transportation  Needs (05/16/2023)   PRAPARE - Administrator, Civil Service (Medical): No    Lack of Transportation (Non-Medical): No  Physical Activity: Inactive (05/16/2023)   Exercise Vital Sign    Days of Exercise per Week: 0 days    Minutes of Exercise per Session: 0 min  Stress: No Stress Concern Present (05/16/2023)   [employer] of Occupational Health - Occupational Stress Questionnaire    Feeling of Stress : Only a little  Social Connections: Moderately Isolated (05/16/2023)   Social Connection and Isolation Panel [NHANES]    Frequency of Communication with Friends and Family: More than three times a week    Frequency of Social Gatherings with Friends and Family: More than three times a week    Attends Religious Services: 1 to 4 times per year    Active Member of [employer] or Organizations: No    Attends Banker Meetings: Never    Marital Status: Widowed    Tobacco Counseling Counseling given: Not Answered   Clinical Intake:  Pre-visit preparation completed: Yes  Pain : No/denies pain     Nutritional Risks: None Diabetes: Yes CBG done?: Yes CBG resulted in Enter/ Edit results?: Yes Did pt. bring in CBG monitor from home?: Yes Glucose Meter Downloaded?: Yes  How often do you need to have someone help you when you read instructions, pamphlets, or other written materials from your doctor or pharmacy?: 2 - Rarely What is the last grade level you completed in school?: 5th grade  Interpreter Needed?: No  Information entered by :: Amare Kontos,cma   Activities of Daily Living    05/16/2023   11:32 AM 05/16/2023    9:39 AM  In your present state of health, do you have any difficulty performing the following activities:  Hearing? 1 1  Vision? 0 0  Difficulty concentrating or making decisions? 1 1  Walking or climbing stairs? 1 1  Dressing or bathing? 0 0  Doing errands, shopping? 1 1    Patient Care Team: Inez Catalina, MD as PCP - General  (Internal Medicine) Chilton Si, MD as PCP - Cardiology (  Cardiology) Nelson Chimes, MD as Consulting Physician (Ophthalmology) Kemper Durie, RN as [facility] Care Management  Indicate any recent Medical Services you may have received from other than [facility] providers in the past year (date may be approximate).     Assessment:   This is a routine wellness examination for [facility].  Hearing/Vision screen No results found.  Dietary issues and exercise activities discussed:     Goals Addressed   None   Depression Screen    05/16/2023   11:32 AM 05/16/2023    9:39 AM 02/28/2023   10:47 AM 02/11/2023    4:03 PM 12/09/2022    9:46 AM 09/06/2022    9:34 AM 07/16/2022    9:09 AM  PHQ 2/9 Scores  PHQ - 2 Score 1 1 0 1 0 0 0  PHQ- 9 Score 3 3         Fall Risk    05/16/2023   11:32 AM 05/16/2023    9:39 AM 02/28/2023   10:46 AM 02/11/2023    4:02 PM 12/09/2022    9:38 AM  Fall Risk   Falls in the past year? 0 0 0 0 0  Number falls in past yr: 0 0 0 0 0  Injury with Fall? 0 0 1 0   Risk for fall due to : No Fall Risks Impaired mobility Medication side effect No Fall Risks Impaired balance/gait;Impaired mobility  Follow up Falls evaluation completed;Falls prevention discussed Falls evaluation completed;Falls prevention discussed Falls evaluation completed;Education provided;Falls prevention discussed Falls evaluation completed;Falls prevention discussed Falls evaluation completed    MEDICARE RISK AT HOME:   TIMED UP AND GO:  Was the test performed? No    Cognitive Function:        Immunizations Immunization History  Administered Date(s) Administered   Fluad Quad(high Dose 65+) 09/06/2022   Influenza Split 08/13/2011   Influenza Whole 09/16/2007, 08/16/2008, 09/18/2009, 08/20/2010   Influenza,inj,Quad PF,6+ Mos 09/24/2013, 07/27/2014, 11/08/2015, 08/20/2017, 08/05/2018   Influenza-Unspecified 10/06/2019   PFIZER(Purple Top)SARS-COV-2 Vaccination 12/01/2019,  12/21/2019, 09/21/2020   PNEUMOCOCCAL CONJUGATE-20 04/29/2022   Pneumococcal-Unspecified 08/28/2011   Td 09/18/2009    TDAP status: Due, Education has been provided regarding the importance of this vaccine. Advised may receive this vaccine at local pharmacy or Health Dept. Aware to provide a copy of the vaccination record if obtained from local pharmacy or Health Dept. Verbalized acceptance and understanding.  Flu Vaccine status: Up to date  Pneumococcal vaccine status: Up to date  Covid-19 vaccine status: Completed vaccines  Qualifies for Shingles Vaccine? No   Zostavax completed No   Shingrix Completed?: No.    Education has been provided regarding the importance of this vaccine. Patient has been advised to call insurance company to determine out of pocket expense if they have not yet received this vaccine. Advised may also receive vaccine at local pharmacy or Health Dept. Verbalized acceptance and understanding.  Screening Tests Health Maintenance  Topic Date Due   Zoster Vaccines- Shingrix (1 of 2) Never done   DTaP/Tdap/Td (2 - Tdap) 09/19/2019   COVID-19 Vaccine (4 - 2023-24 season) 07/19/2022   OPHTHALMOLOGY EXAM  09/10/2022   LIPID PANEL  04/30/2023   FOOT EXAM  07/11/2023 (Originally 04/30/2023)   INFLUENZA VACCINE  06/19/2023   HEMOGLOBIN A1C  11/15/2023   Medicare Annual Wellness (AWV)  05/15/2024   Pneumonia Vaccine 60+ Years old  Completed   DEXA SCAN  Completed   HPV VACCINES  Aged Out    Health Maintenance  Health Maintenance Due  Topic Date Due   Zoster Vaccines- Shingrix (1 of 2) Never done   DTaP/Tdap/Td (2 - Tdap) 09/19/2019   COVID-19 Vaccine (4 - 2023-24 season) 07/19/2022   OPHTHALMOLOGY EXAM  09/10/2022   LIPID PANEL  04/30/2023       Bone Density status: Completed 06/18/2022. Results reflect: Bone density results: OSTEOPENIA. Repeat every 0 years.  Lung Cancer Screening: (Low Dose CT Chest recommended if Age 80-80 years, 20 pack-year currently  smoking OR have quit w/in 15years.) does not qualify.   Lung Cancer Screening Referral: N/A  Additional Screening:  Hepatitis C Screening: does not qualify; Completed N/A  Vision Screening: Recommended annual ophthalmology exams for early detection of glaucoma and other disorders of the eye. Is the patient up to date with their annual eye exam?  Yes  Who is the provider or what is the name of the office in which the patient attends annual eye exams? [facility] If pt is not established with a provider, would they like to be referred to a provider to establish care? No .   Dental Screening: Recommended annual dental exams for proper oral hygiene  Diabetic Foot Exam: Diabetic Foot Exam: Overdue, Pt has been advised about the importance in completing this exam. Pt is scheduled for diabetic foot exam on N/A.  Community Resource Referral / Chronic Care Management: CRR required this visit?  No   CCM required this visit?  No     Plan:     I have personally reviewed and noted the following in the patient's chart:   Medical and social history Use of alcohol, tobacco or illicit drugs  Current medications and supplements including opioid prescriptions. Patient is not currently taking opioid prescriptions. Functional ability and status Nutritional status Physical activity Advanced directives List of other physicians Hospitalizations, surgeries, and ER visits in previous 12 months Vitals Screenings to include cognitive, depression, and falls Referrals and appointments  In addition, I have reviewed and discussed with patient certain preventive protocols, quality metrics, and best practice recommendations. A written personalized care plan for preventive services as well as general preventive health recommendations were provided to patient.     Cala Bradford, CMA   05/16/2023   After Visit Summary: (MyChart) Due to this being a telephonic visit, the after visit summary with patients  personalized plan was offered to patient via MyChart   Nurse Notes: Face-To-Face Visit  Ms. Harmes , Thank you for taking time to come for your Medicare Wellness Visit. I appreciate your ongoing commitment to your health goals. Please review the following plan we discussed and let me know if I can assist you in the future.   These are the goals we discussed:  Goals      Effective management of DM     Care Coordination Interventions: Provided education to patient about basic DM disease process Reviewed medications with patient and discussed importance of medication adherence Counseled on importance of regular laboratory monitoring as prescribed Discussed plans with patient for ongoing care management follow up and provided patient with direct contact information for care management team Provided patient with written educational materials related to hypo and hyperglycemia and importance of correct treatment         This is a list of the screening recommended for you and due dates:  Health Maintenance  Topic Date Due   Zoster (Shingles) Vaccine (1 of 2) Never done   DTaP/Tdap/Td vaccine (2 - Tdap) 09/19/2019   COVID-19 Vaccine (  4 - 2023-24 season) 07/19/2022   Eye exam for diabetics  09/10/2022   Lipid (cholesterol) test  04/30/2023   Complete foot exam   07/11/2023*   Flu Shot  06/19/2023   Hemoglobin A1C  11/15/2023   Medicare Annual Wellness Visit  05/15/2024   Pneumonia Vaccine  Completed   DEXA scan (bone density measurement)  Completed   HPV Vaccine  Aged Out  *Topic was postponed. The date shown is not the original due date.

## 2023-05-17 ENCOUNTER — Other Ambulatory Visit: Payer: Self-pay | Admitting: Internal Medicine

## 2023-05-17 LAB — CBC
Hematocrit: 35.4 % (ref 34.0–46.6)
Platelets: 299 10*3/uL (ref 150–450)
RDW: 13.6 % (ref 11.7–15.4)
WBC: 6.6 10*3/uL (ref 3.4–10.8)

## 2023-05-17 LAB — BMP8+ANION GAP

## 2023-05-18 LAB — BMP8+ANION GAP
BUN: 21 mg/dL (ref 8–27)
Calcium: 9.7 mg/dL (ref 8.7–10.3)
Sodium: 141 mmol/L (ref 134–144)

## 2023-05-18 LAB — CBC
Hemoglobin: 11.8 g/dL (ref 11.1–15.9)
MCH: 29 pg (ref 26.6–33.0)
MCHC: 33.3 g/dL (ref 31.5–35.7)
MCV: 87 fL (ref 79–97)
RBC: 4.07 x10E6/uL (ref 3.77–5.28)

## 2023-05-18 LAB — LIPID PANEL
Chol/HDL Ratio: 3.2 ratio (ref 0.0–4.4)
Cholesterol, Total: 143 mg/dL (ref 100–199)
HDL: 45 mg/dL (ref >39)
LDL Chol Calc (NIH): 77 mg/dL (ref 0–99)
Triglycerides: 115 mg/dL (ref 0–149)
VLDL Cholesterol Cal: 21 mg/dL (ref 5–40)

## 2023-05-18 LAB — TSH: TSH: 4.83 u[IU]/mL — ABNORMAL HIGH (ref 0.450–4.500)

## 2023-05-19 ENCOUNTER — Other Ambulatory Visit: Payer: Self-pay | Admitting: Pediatrics

## 2023-05-19 DIAGNOSIS — K219 Gastro-esophageal reflux disease without esophagitis: Secondary | ICD-10-CM

## 2023-05-19 DIAGNOSIS — H524 Presbyopia: Secondary | ICD-10-CM | POA: Diagnosis not present

## 2023-05-19 DIAGNOSIS — E119 Type 2 diabetes mellitus without complications: Secondary | ICD-10-CM | POA: Diagnosis not present

## 2023-05-20 NOTE — Assessment & Plan Note (Signed)
Currently asymptomatic.  Off of steroids.  No need for ESR check today.

## 2023-05-20 NOTE — Assessment & Plan Note (Signed)
She is doing well on imipramine, no side effects, no headaches.

## 2023-05-20 NOTE — Assessment & Plan Note (Addendum)
She is due for lipid panel.  Her last LDL was 61.  She is on atorvastatin and plavix.  These medications will be continued.

## 2023-05-20 NOTE — Assessment & Plan Note (Signed)
We will discuss vaccines at next visit.

## 2023-05-20 NOTE — Assessment & Plan Note (Signed)
She is on a low dose of synthroid.  Check TSH today.

## 2023-05-20 NOTE — Assessment & Plan Note (Signed)
This is a recurrent and problematic issue for her.  She does not have surgical options.  She noted rectal bleeding, one episode today.  CBC was reassuring.  She had no wounds or lesions on visual exam today.  Continue to monitor.

## 2023-05-20 NOTE — Assessment & Plan Note (Signed)
She is euvolemic on exam today, no apparent shortness of breath.

## 2023-05-20 NOTE — Assessment & Plan Note (Signed)
BP today is 129/70.  She has no complaints, no headaches or vision changes.  She is taking valsartan and amlodipine without issue.

## 2023-05-20 NOTE — Assessment & Plan Note (Signed)
Recent checks have looked stable, BMET today for renal function.  She has no symptoms of dysuria, low urine output or uremia.

## 2023-05-20 NOTE — Assessment & Plan Note (Signed)
She is doing well on low dose glimepiride.  She brought in her meter readings which showed 74% in range.  She had an A1C today which was 6.8.  She is due to get her eye exam on Monday of next week. Her LDL was previously 61 and her renal function is relatively stable.  She is on an ARB.  We will plan to check renal function toady and Lipid panel.

## 2023-05-26 ENCOUNTER — Ambulatory Visit: Payer: Self-pay | Admitting: *Deleted

## 2023-05-26 NOTE — Patient Outreach (Signed)
  Care Coordination   05/26/2023 Name: Christine [name] MRN: [id number] DOB: [birth year]   Care Coordination Outreach Attempts:  An unsuccessful telephone outreach was attempted for a scheduled appointment today.  Follow Up Plan:  Additional outreach attempts will be made to offer the patient care coordination information and services.   Encounter Outcome:  No Answer   Care Coordination Interventions:  No, not indicated    Kemper Durie, RN, MSN, [facility] Care Management Care Management Coordinator [phone]

## 2023-06-03 ENCOUNTER — Other Ambulatory Visit: Payer: Self-pay | Admitting: Internal Medicine

## 2023-06-03 DIAGNOSIS — M790 Rheumatism, unspecified: Secondary | ICD-10-CM

## 2023-06-03 DIAGNOSIS — J984 Other disorders of lung: Secondary | ICD-10-CM

## 2023-06-17 ENCOUNTER — Ambulatory Visit: Payer: Self-pay | Admitting: *Deleted

## 2023-06-17 NOTE — Patient Outreach (Signed)
  Care Coordination   06/17/2023 Name: Christine [name] MRN: [id number] DOB: 1933/03/22   Care Coordination Outreach Attempts:  An unsuccessful telephone outreach was attempted for a scheduled appointment today.  Follow Up Plan:  Additional outreach attempts will be made to offer the patient care coordination information and services.   Encounter Outcome:  No Answer   Care Coordination Interventions:  No, not indicated    Kemper Durie, RN, MSN, [facility] Care Management Care Management Coordinator [phone]

## 2023-06-17 NOTE — Patient Outreach (Signed)
  Care Coordination   Follow Up Visit Note   06/17/2023 Name: Christine [name] MRN: [id number] DOB: April 19, 1933  Christine [name] is a 89 y.o. year old female who sees Inez Catalina, MD for primary care. I spoke with  [name] by phone today.  What matters to the patients health and wellness today?  Keep blood pressure down    Goals Addressed             This Visit's Progress    Effective management of DM   On track    Care Coordination Interventions: Provided education to patient about basic DM disease process Reviewed medications with patient and discussed importance of medication adherence Counseled on importance of regular laboratory monitoring as prescribed Discussed plans with patient for ongoing care management follow up and provided patient with direct contact information for care management team Provided patient with written educational materials related to hypo and hyperglycemia and importance of correct treatment      Management of HTN       Interventions Today    Flowsheet Row Most Recent Value  Chronic Disease   Chronic disease during today's visit Diabetes, Hypertension (HTN)  General Interventions   General Interventions Discussed/Reviewed General Interventions Reviewed, Doctor Visits, Durable Medical Equipment (DME)  Doctor Visits Discussed/Reviewed Doctor Visits Reviewed, PCP, Specialist  Eather Colas of need to call PCP office to schedule 3 month follow up for September]  Durable Medical Equipment (DME) BP Cuff, Glucomoter  [need new BP monitor, daughter will purchase one]  PCP/Specialist Visits Compliance with follow-up visit  Education Interventions   Education Provided Provided Education  Provided Verbal Education On Nutrition, Labs, When to see the doctor, Medication, Eye Care, Foot Care  Labs Reviewed Hgb A1c  [recent A1C 6.8]  Nutrition Interventions   Nutrition Discussed/Reviewed Nutrition Reviewed, Adding fruits and vegetables, Decreasing salt,  Decreasing sugar intake              SDOH assessments and interventions completed:  No     Care Coordination Interventions:  Yes, provided   Follow up plan: Follow up call scheduled for 8/27    Encounter Outcome:  Pt. Visit Completed   Kemper Durie, RN, MSN, [facility] Care Management Care Management Coordinator [phone]

## 2023-07-03 ENCOUNTER — Encounter: Payer: Self-pay | Admitting: Internal Medicine

## 2023-07-09 ENCOUNTER — Encounter: Payer: Medicare Other | Admitting: Internal Medicine

## 2023-07-15 ENCOUNTER — Ambulatory Visit: Payer: Self-pay | Admitting: *Deleted

## 2023-07-15 NOTE — Patient Outreach (Signed)
  Care Coordination   Follow Up Visit Note   07/15/2023 Name: Christine [name] MRN: [id number] DOB: [birth year]  Christine [name] is a 89 y.o. year old female who sees Christine Catalina, MD for primary care. I spoke with  Christine [name] and [place], daughter by phone today.  What matters to the patients health and wellness today?  Have patient seen for back pain    Goals Addressed             This Visit's Progress    Effective management of DM   On track    Care Coordination Interventions: Provided education to patient about basic DM disease process Reviewed medications with patient and discussed importance of medication adherence Counseled on importance of regular laboratory monitoring as prescribed Discussed plans with patient for ongoing care management follow up and provided patient with direct contact information for care management team Provided patient with written educational materials related to hypo and hyperglycemia and importance of correct treatment      Management of HTN   On track     Interventions Today    Flowsheet Row Most Recent Value  Chronic Disease   Chronic disease during today's visit Diabetes, Hypertension (HTN), Other  [back pain]  General Interventions   General Interventions Discussed/Reviewed General Interventions Reviewed, Doctor Visits, Communication with, Durable Medical Equipment (DME)  Doctor Visits Discussed/Reviewed Doctor Visits Reviewed, PCP  [PCP on 9/18]  Durable Medical Equipment (DME) BP Cuff  PCP/Specialist Visits Compliance with follow-up visit  Communication with PCP/Specialists  [Call placed to PCP office to inquire about sooner appt for back pain, notified that patient verbalized only wanting to see her regular provider.  No sooner appts available]  Education Interventions   Education Provided Provided Education  Provided Verbal Education On Medication, When to see the doctor, Nutrition, Blood Sugar Monitoring  [Report blood pressure  170s/70s, daughter agrees it could be caused by pain.  Will continue to monitor]  Mental Health Interventions   Mental Health Discussed/Reviewed Mental Health Discussed, Coping Strategies  Nutrition Interventions   Nutrition Discussed/Reviewed Nutrition Reviewed, Adding fruits and vegetables  Safety Interventions   Safety Discussed/Reviewed Safety Discussed, Home Safety  Home Safety Assistive Devices  Advanced Directive Interventions   Advanced Directives Discussed/Reviewed Advanced Directives Discussed              SDOH assessments and interventions completed:  No     Care Coordination Interventions:  Yes, provided   Follow up plan: Follow up call scheduled for 9/24    Encounter Outcome:  Pt. Visit Completed   Kemper Durie, RN, MSN, [facility] Care Management Care Management Coordinator [phone]

## 2023-07-15 NOTE — Patient Instructions (Signed)
Visit Information  Thank you for taking time to visit with me today. Please don't hesitate to contact me if I can be of assistance to you before our next scheduled telephone appointment.  Following are the goals we discussed today:  Keep appointment with PCP on 9/18.   Consider seeing an alternate provider the week of 9/11 if back pain and/or blood pressure has increased.   Our next appointment is by telephone on 9/24  Please call the care guide team at [phone] if you need to cancel or reschedule your appointment.   Please call the Suicide and Crisis Lifeline: 988 call the [place] National Suicide Prevention Lifeline: 503-517-1127 or TTY: (580) 850-4477 TTY 973-858-3455) to talk to a trained counselor call 1-800-273-TALK (toll free, 24 hour hotline) call 911 if you are experiencing a Mental Health or Behavioral Health Crisis or need someone to talk to.  Patient verbalizes understanding of instructions and care plan provided today and agrees to view in MyChart. Active MyChart status and patient understanding of how to access instructions and care plan via MyChart confirmed with patient.     The patient has been provided with contact information for the care management team and has been advised to call with any health related questions or concerns.   Kemper Durie [facility] Care Management Care Management Coordinator [phone]

## 2023-07-30 ENCOUNTER — Other Ambulatory Visit: Payer: Self-pay

## 2023-07-30 ENCOUNTER — Encounter: Payer: Self-pay | Admitting: Internal Medicine

## 2023-07-30 ENCOUNTER — Ambulatory Visit (INDEPENDENT_AMBULATORY_CARE_PROVIDER_SITE_OTHER): Payer: Medicare Other | Admitting: Internal Medicine

## 2023-07-30 VITALS — BP 139/60 | HR 72 | Temp 98.2°F | Wt 133.6 lb

## 2023-07-30 DIAGNOSIS — J302 Other seasonal allergic rhinitis: Secondary | ICD-10-CM

## 2023-07-30 DIAGNOSIS — I4719 Other supraventricular tachycardia: Secondary | ICD-10-CM

## 2023-07-30 DIAGNOSIS — M790 Rheumatism, unspecified: Secondary | ICD-10-CM

## 2023-07-30 DIAGNOSIS — Z23 Encounter for immunization: Secondary | ICD-10-CM | POA: Diagnosis not present

## 2023-07-30 DIAGNOSIS — I10 Essential (primary) hypertension: Secondary | ICD-10-CM | POA: Diagnosis not present

## 2023-07-30 DIAGNOSIS — Z0000 Encounter for general adult medical examination without abnormal findings: Secondary | ICD-10-CM

## 2023-07-30 DIAGNOSIS — R2681 Unsteadiness on feet: Secondary | ICD-10-CM

## 2023-07-30 DIAGNOSIS — F3342 Major depressive disorder, recurrent, in full remission: Secondary | ICD-10-CM | POA: Diagnosis not present

## 2023-07-30 DIAGNOSIS — E119 Type 2 diabetes mellitus without complications: Secondary | ICD-10-CM

## 2023-07-30 DIAGNOSIS — I4589 Other specified conduction disorders: Secondary | ICD-10-CM

## 2023-07-30 DIAGNOSIS — M47816 Spondylosis without myelopathy or radiculopathy, lumbar region: Secondary | ICD-10-CM | POA: Diagnosis not present

## 2023-07-30 DIAGNOSIS — K5790 Diverticulosis of intestine, part unspecified, without perforation or abscess without bleeding: Secondary | ICD-10-CM

## 2023-07-30 DIAGNOSIS — I479 Paroxysmal tachycardia, unspecified: Secondary | ICD-10-CM

## 2023-07-30 DIAGNOSIS — M19041 Primary osteoarthritis, right hand: Secondary | ICD-10-CM

## 2023-07-30 DIAGNOSIS — M5412 Radiculopathy, cervical region: Secondary | ICD-10-CM | POA: Diagnosis not present

## 2023-07-30 DIAGNOSIS — J984 Other disorders of lung: Secondary | ICD-10-CM

## 2023-07-30 LAB — GLUCOSE, CAPILLARY: Glucose-Capillary: 104 mg/dL — ABNORMAL HIGH (ref 70–99)

## 2023-07-30 LAB — POCT GLYCOSYLATED HEMOGLOBIN (HGB A1C): Hemoglobin A1C: 7.5 % — AB (ref 4.0–5.6)

## 2023-07-30 MED ORDER — FLUTICASONE-SALMETEROL 250-50 MCG/ACT IN AEPB
1.0000 | INHALATION_SPRAY | Freq: Two times a day (BID) | RESPIRATORY_TRACT | 3 refills | Status: DC
Start: 2023-07-30 — End: 2024-02-17

## 2023-07-30 MED ORDER — ACETAMINOPHEN-CODEINE 300-30 MG PO TABS
0.5000 | ORAL_TABLET | Freq: Three times a day (TID) | ORAL | 0 refills | Status: DC | PRN
Start: 2023-07-30 — End: 2023-10-27

## 2023-07-30 NOTE — Patient Instructions (Signed)
Christine [name] - -  Please try these stretches.  You can do them day and night.  Please take your medications as you have been.   Please get an updated blood pressure cuff to see if your blood pressure is high at home.  Continue taking your 2 medications as you have been which has controlled your blood pressure in the past.    Thank you!  Come back to see Dr. Mayford Knife in 4 months, sooner if needed.   Christine [name] - - Please send me Blood pressure readings at home.    Thank you!

## 2023-07-30 NOTE — Progress Notes (Signed)
Established Patient Office Visit  Subjective   [name], female    DOB: [birth year]  Age: 89 y.o. MRN: [id number]  Chief Complaint  Patient presents with   Back Pain    Ms. Vindiola is an 89 year old woman with PMH of PMR, on intermittent steroids, HTN, DM2 who presents for follow up.   She notes intermittent back pain, in the lower back, worse after more activity.  She has improvement with rest, heat and her tylenol #3 which she takes sparingly.  She has no spasms.  No sharp or shooting pains.  She has chronic thigh pain which she relates to her rectal prolapse and her PMR and these are okay right now.  She has limited ROM and the pain can come on anytime.   A1C 7.5, which is a little higher than she likes.  She is on a sulfonylurea and checking her blood sugar.  We reviewed her blood sugar log.   Her BP is fine in the clinic today.  She is on therapy and taking her medications.  She reports that her BP cuff at home has not been working and either has been reading high or "new cuff."  She has been well controlled previously on this regimen and it is reassuring that her pressure is good in the clinic.   Her daughter is requesting FMLA paperwork to help with her mom at home, particularly when she has bad days.  I will fill this out.  [name] will send via mychart.       Review of Systems  Constitutional:  Negative for chills, fever, malaise/fatigue and weight loss.  Respiratory:  Negative for cough and shortness of breath.   Cardiovascular:  Negative for chest pain and leg swelling.  Musculoskeletal:  Positive for back pain, joint pain and neck pain. Negative for falls.  Neurological:  Negative for dizziness and weakness.      Objective:     BP 139/60 (BP Location: Left Arm, Patient Position: Sitting, Cuff Size: Normal)   Pulse 72   Temp 98.2 F (36.8 C) (Oral)   Wt 133 lb 9.6 oz (60.6 kg)   LMP 02/11/1975 (Approximate)   SpO2 100% Comment: RA  BMI 22.93 kg/m   BP Readings from Last 3 Encounters:  07/30/23 139/60  05/16/23 129/70  05/16/23 129/70   Wt Readings from Last 3 Encounters:  07/30/23 133 lb 9.6 oz (60.6 kg)  05/16/23 129 lb 4.8 oz (58.7 kg)  05/16/23 129 lb 4.8 oz (58.7 kg)      Physical Exam Vitals and nursing note reviewed.  Constitutional:      General: She is not in acute distress.    Appearance: Normal appearance. She is not toxic-appearing.  HENT:     Head: Normocephalic and atraumatic.  Musculoskeletal:     Right shoulder: Bony tenderness present.     Left shoulder: No bony tenderness.     Lumbar back: No swelling, edema, signs of trauma, spasms, tenderness or bony tenderness. Decreased range of motion.  Neurological:     Mental Status: She is alert.      Results for orders placed or performed in visit on 07/30/23  Glucose, capillary  Result Value Ref Range   Glucose-Capillary 104 (H) 70 - 99 mg/dL  POC Hbg N8G  Result Value Ref Range   Hemoglobin A1C 7.5 (A) 4.0 - 5.6 %   HbA1c POC (<> result, manual entry)     HbA1c, POC (prediabetic range)  HbA1c, POC (controlled diabetic range)        The ASCVD Risk score ([name] DK, et al., 2019) failed to calculate for the following reasons:   The 2019 ASCVD risk score is only valid for ages 5 to 49    Assessment & Plan:   Problem List Items Addressed This Visit       Unprioritized   Type 2 diabetes mellitus, controlled (HCC) - Primary (Chronic)    Well controlled, A1C is 7.5.  She will continue her glimepiride.  She is due for an eye exam, which I reminded her of.       Relevant Orders   POC Hbg A1C (Completed)   Essential hypertension (Chronic)    BP higher today and previously controlling regimen.  Her home BP cuff has been malfunctioning.  Daughter, Carmie Kanner, who is with her will plan to get a new cuff and send results to me.       Allergic rhinitis (Chronic)    She takes intermittent flonase with good results.  No complaints today.        Lumbar spondylosis (Chronic)    Discussed today.  She notes some days are bad, improvement with rest, on good days able to walk easily.  We discussed the nature of her arthritis and her limited ROM.  She is using tylenol #3, 1/2 a tablet on some days and a whole tablet on bad days.  She has good relief with this.  I advised that if she was not having difficulty with this dose, that it would be reasonable to continue.  I also provided her a list of chair exercises/stretches she could do to help.   Her daughter, Carmie Kanner, would like FMLA paperwork filled out so that she can be with her mom on bad days and she will fax that paperwork over to the clinic.       Relevant Medications   acetaminophen-codeine (TYLENOL #3) 300-30 MG tablet   MDD (major depressive disorder), recurrent, in full remission (HCC) (Chronic)    Severe depression after stopping TCA, with psychosis.  Since being back on TCA, has no issues.       Rheumatic disease (Chronic)    Chronic, PMR, GCA and OA.  On therapy as noted in other conditions.       Relevant Medications   acetaminophen-codeine (TYLENOL #3) 300-30 MG tablet   fluticasone-salmeterol (WIXELA INHUB) 250-50 MCG/ACT AEPB   PAT (paroxysmal atrial tachycardia) (Chronic)    Intermittent, has not been able to tolerate beta blockade due to chronotropic incompetence. Last saw Cardiology in 2022.       Cervical radiculopathy at C7 (Chronic)   Primary osteoarthritis of both hands (Chronic)    Stable with current therapy.  Monitor.       Relevant Medications   acetaminophen-codeine (TYLENOL #3) 300-30 MG tablet   Diverticulosis   Routine adult health maintenance    Flu vaccine given today.       Gait instability    Using walker and wheelchair as needed.       Chronotropic incompetence    Avoid beta blockade.       Other Visit Diagnoses     Restrictive lung disease       Relevant Medications   fluticasone-salmeterol (WIXELA INHUB) 250-50 MCG/ACT AEPB    Encounter for immunization       Relevant Orders   Flu Vaccine Trivalent High Dose (Fluad) (Completed)       Return in about 4 months (around  11/29/2023).    Debe Coder, MD

## 2023-08-04 NOTE — Assessment & Plan Note (Signed)
Stable with current therapy.  Monitor.

## 2023-08-04 NOTE — Assessment & Plan Note (Signed)
Avoid beta blockade.

## 2023-08-04 NOTE — Assessment & Plan Note (Signed)
Flu vaccine given today. 

## 2023-08-04 NOTE — Assessment & Plan Note (Signed)
Chronic, PMR, GCA and OA.  On therapy as noted in other conditions.

## 2023-08-04 NOTE — Assessment & Plan Note (Signed)
Initially diagnosed in 2019, she and family declined formal biopsy.  She has occasional flares, but has not been on steroids for a while now.  Monitoring.

## 2023-08-04 NOTE — Assessment & Plan Note (Signed)
Severe depression after stopping TCA, with psychosis.  Since being back on TCA, has no issues.

## 2023-08-04 NOTE — Assessment & Plan Note (Signed)
Using walker and wheelchair as needed.

## 2023-08-04 NOTE — Assessment & Plan Note (Signed)
Discussed today.  She notes some days are bad, improvement with rest, on good days able to walk easily.  We discussed the nature of her arthritis and her limited ROM.  She is using tylenol #3, 1/2 a tablet on some days and a whole tablet on bad days.  She has good relief with this.  I advised that if she was not having difficulty with this dose, that it would be reasonable to continue.  I also provided her a list of chair exercises/stretches she could do to help.   Her daughter, Carmie Kanner, would like FMLA paperwork filled out so that she can be with her mom on bad days and she will fax that paperwork over to the clinic.

## 2023-08-04 NOTE — Assessment & Plan Note (Signed)
She takes intermittent flonase with good results.  No complaints today.

## 2023-08-04 NOTE — Assessment & Plan Note (Signed)
Intermittent, has not been able to tolerate beta blockade due to chronotropic incompetence. Last saw Cardiology in 2022.

## 2023-08-04 NOTE — Assessment & Plan Note (Signed)
BP higher today and previously controlling regimen.  Her home BP cuff has been malfunctioning.  Daughter, Carmie Kanner, who is with her will plan to get a new cuff and send results to me.

## 2023-08-04 NOTE — Assessment & Plan Note (Signed)
Well controlled, A1C is 7.5.  She will continue her glimepiride.  She is due for an eye exam, which I reminded her of.

## 2023-08-06 ENCOUNTER — Encounter: Payer: Medicare Other | Admitting: Internal Medicine

## 2023-08-09 ENCOUNTER — Other Ambulatory Visit: Payer: Self-pay | Admitting: Internal Medicine

## 2023-08-09 DIAGNOSIS — F3342 Major depressive disorder, recurrent, in full remission: Secondary | ICD-10-CM

## 2023-08-12 ENCOUNTER — Ambulatory Visit: Payer: Self-pay | Admitting: *Deleted

## 2023-08-12 NOTE — Patient Outreach (Signed)
Care Coordination   Follow Up Visit Note   08/14/2023 Name: KHANIYA KUST MRN: [id number] DOB: January 11, 1933  MAYLE BECKWITH is a 89 y.o. year old female who sees Inez Catalina, MD for primary care. I spoke with  [name] by phone today.  What matters to the patients health and wellness today?  Patient report blood pressure has been high lately, today 179/90, but concerned that she does not have the right cuff size.  She will consider nurse visit at PCP office to compare.  Denies any urgent concerns, encouraged to contact this care manager with questions.      Goals Addressed             This Visit's Progress    Effective management of DM   On track    Care Coordination Interventions: Provided education to patient about basic DM disease process Reviewed medications with patient and discussed importance of medication adherence Counseled on importance of regular laboratory monitoring as prescribed Discussed plans with patient for ongoing care management follow up and provided patient with direct contact information for care management team Provided patient with written educational materials related to hypo and hyperglycemia and importance of correct treatment      Management of HTN   On track      Interventions Today    Flowsheet Row Most Recent Value  Chronic Disease   Chronic disease during today's visit Diabetes, Hypertension (HTN)  General Interventions   General Interventions Discussed/Reviewed General Interventions Reviewed, Doctor Visits, Labs, Vaccines, Durable Medical Equipment (DME)  Labs Hgb A1c every 3 months  [7.5]  Vaccines Flu  Doctor Visits Discussed/Reviewed Doctor Visits Reviewed, PCP  [last visit on 9/11, follow up in 4 months]  Durable Medical Equipment (DME) Glucomoter, BP Cuff  [Encouraged to take BP monitor to PCP or pharmacy to compare readings and confirm she has the right size cuff]  PCP/Specialist Visits Compliance with follow-up visit   Education Interventions   Education Provided Provided Education  Provided Verbal Education On Labs, Blood Sugar Monitoring, Medication, When to see the doctor  [Reminded to check BP and record readings]  Labs Reviewed Hgb A1c              SDOH assessments and interventions completed:  No     Care Coordination Interventions:  Yes, provided   Follow up plan: Follow up call scheduled for 10/24    Encounter Outcome:  Patient Visit Completed   Kemper Durie, RN, MSN, [facility] Care Management Care Management Coordinator [phone]

## 2023-08-14 NOTE — Patient Instructions (Signed)
Visit Information  Thank you for taking time to visit with me today. Please don't hesitate to contact me if I can be of assistance to you before our next scheduled telephone appointment.  Following are the goals we discussed today:  Monitor blood pressure and blood sugars daily. Consider nurse visit at PCP office to compare BP readings.  Our next appointment is by telephone on 10/24  Please call the care guide team at [phone] if you need to cancel or reschedule your appointment.   Please call the Suicide and Crisis Lifeline: 988 call the [place] National Suicide Prevention Lifeline: (915) 630-7302 or TTY: (302)432-9067 TTY (828)659-0083) to talk to a trained counselor call 1-800-273-TALK (toll free, 24 hour hotline) call 911 if you are experiencing a Mental Health or Behavioral Health Crisis or need someone to talk to.  Patient verbalizes understanding of instructions and care plan provided today and agrees to view in MyChart. Active MyChart status and patient understanding of how to access instructions and care plan via MyChart confirmed with patient.     We have been unable to make contact with the patient for follow up. The care management team is available to follow up with the patient after provider conversation with the patient regarding recommendation for care management engagement and subsequent re-referral to the care management team.   Kemper Durie [facility] Care Management Care Management Coordinator [phone]

## 2023-08-15 ENCOUNTER — Encounter: Payer: Medicare Other | Admitting: Internal Medicine

## 2023-09-11 ENCOUNTER — Ambulatory Visit: Payer: Self-pay | Admitting: *Deleted

## 2023-09-11 NOTE — Patient Outreach (Signed)
Care Coordination   09/11/2023 Name: Christine [name] MRN: [id number] DOB: 02-19-1933   Care Coordination Outreach Attempts:  An unsuccessful telephone outreach was attempted for a scheduled appointment today.  Follow Up Plan:  Additional outreach attempts will be made to offer the patient care coordination information and services.   Encounter Outcome:  No Answer   Care Coordination Interventions:  No, not indicated    Kemper Durie RN, MSN, CCM [facility], [facility] RN Care Coordinator Direct Dial: [phone] / Main [phone] Fax [phone] Email: [email or web address]: [email or web address]

## 2023-09-11 NOTE — Patient Outreach (Signed)
Care Coordination  ACO Reach  Follow Up Visit Note   09/11/2023 Name: Christine [name] MRN: [id number] DOB: 30-Dec-1932  Christine [name] is a 89 y.o. year old female who sees Inez Catalina, MD for primary care. I spoke with  [name] by phone today.  What matters to the patients health and wellness today?  Working to decrease BP and keep DM managed.     Goals Addressed             This Visit's Progress    Effective management of DM   On track    Care Coordination Interventions: Provided education to patient about basic DM disease process Reviewed medications with patient and discussed importance of medication adherence Counseled on importance of regular laboratory monitoring as prescribed Discussed plans with patient for ongoing care management follow up and provided patient with direct contact information for care management team Provided patient with written educational materials related to hypo and hyperglycemia and importance of correct treatment      Management of HTN   On track      Interventions Today    Flowsheet Row Most Recent Value  Chronic Disease   Chronic disease during today's visit Diabetes, Hypertension (HTN)  General Interventions   General Interventions Discussed/Reviewed General Interventions Reviewed, Labs, Doctor Visits, Vaccines, Durable Medical Equipment (DME)  Labs Hgb A1c every 3 months  [7.5 recently]  Vaccines COVID-19, Flu  Doctor Visits Discussed/Reviewed Doctor Visits Reviewed, PCP  [PCP follow up in December]  Durable Medical Equipment (DME) BP Cuff  [report having new BP monitor]  PCP/Specialist Visits Compliance with follow-up visit  [last office visit with PCP on 9/11]  Education Interventions   Education Provided Provided Education  Provided Verbal Education On Nutrition, Blood Sugar Monitoring, Medication, When to see the doctor  [Report blood sugars ranged 130-190 in the last week, BP remains slightly elevated (158/70).]   Nutrition Interventions   Nutrition Discussed/Reviewed Nutrition Reviewed, Decreasing fats, Decreasing salt, Increasing proteins, Adding fruits and vegetables, Carbohydrate meal planning, Decreasing sugar intake, Supplemental nutrition  [Drinking ensure, advised to use Glucerna instead.  Still working on proper diet management]              SDOH assessments and interventions completed:  No     Care Coordination Interventions:  Yes, provided   Follow up plan: Follow up call scheduled for 11/22    Encounter Outcome:  Patient Visit Completed   Kemper Durie RN, MSN, CCM [facility]  [facility], [facility] RN Care Coordinator Direct Dial: [phone] / Main [phone] Fax [phone] Email: [email or web address] Website: [email or web address]

## 2023-09-14 ENCOUNTER — Encounter: Payer: Self-pay | Admitting: Internal Medicine

## 2023-09-15 ENCOUNTER — Other Ambulatory Visit: Payer: Self-pay

## 2023-09-15 ENCOUNTER — Ambulatory Visit (INDEPENDENT_AMBULATORY_CARE_PROVIDER_SITE_OTHER): Payer: Medicare Other | Admitting: Student

## 2023-09-15 ENCOUNTER — Encounter: Payer: Self-pay | Admitting: Student

## 2023-09-15 ENCOUNTER — Ambulatory Visit (HOSPITAL_COMMUNITY)
Admission: RE | Admit: 2023-09-15 | Discharge: 2023-09-15 | Disposition: A | Discharge status: Home / Self Care | Payer: Medicare Other | Source: Ambulatory Visit | Attending: Internal Medicine | Admitting: Internal Medicine

## 2023-09-15 VITALS — BP 137/52 | HR 91 | Temp 98.2°F | Ht 64.0 in | Wt 132.3 lb

## 2023-09-15 DIAGNOSIS — R5383 Other fatigue: Secondary | ICD-10-CM

## 2023-09-15 LAB — GLUCOSE, CAPILLARY: Glucose-Capillary: 125 mg/dL — ABNORMAL HIGH (ref 70–99)

## 2023-09-15 NOTE — Patient Instructions (Signed)
Thank you so much for coming to the clinic today!   It's difficult to tell exactly what the cause of your symptoms are, but we will get some labs to further evaluate. I will call you with the results as soon as they are available.   If you have any questions please feel free to the call the clinic at anytime at [phone]. It was a pleasure seeing you!  Best, Dr. Thomasene Ripple

## 2023-09-16 LAB — CBC WITH DIFFERENTIAL/PLATELET
Basophils Absolute: 0 10*3/uL (ref 0.0–0.2)
Basos: 0 %
EOS (ABSOLUTE): 0 10*3/uL (ref 0.0–0.4)
Eos: 0 %
Hematocrit: 36 % (ref 34.0–46.6)
Hemoglobin: 11.9 g/dL (ref 11.1–15.9)
Immature Grans (Abs): 0 10*3/uL (ref 0.0–0.1)
Immature Granulocytes: 0 %
Lymphocytes Absolute: 1.8 10*3/uL (ref 0.7–3.1)
Lymphs: 24 %
MCH: 28.7 pg (ref 26.6–33.0)
MCHC: 33.1 g/dL (ref 31.5–35.7)
MCV: 87 fL (ref 79–97)
Monocytes Absolute: 0.6 10*3/uL (ref 0.1–0.9)
Monocytes: 7 %
Neutrophils Absolute: 5.1 10*3/uL (ref 1.4–7.0)
Neutrophils: 69 %
Platelets: 343 10*3/uL (ref 150–450)
RBC: 4.15 x10E6/uL (ref 3.77–5.28)
RDW: 12.7 % (ref 11.7–15.4)
WBC: 7.5 10*3/uL (ref 3.4–10.8)

## 2023-09-16 LAB — BMP8+ANION GAP
Anion Gap: 18 mmol/L (ref 10.0–18.0)
BUN/Creatinine Ratio: 25 (ref 12–28)
BUN: 27 mg/dL (ref 8–27)
CO2: 19 mmol/L — ABNORMAL LOW (ref 20–29)
Calcium: 10 mg/dL (ref 8.7–10.3)
Chloride: 102 mmol/L (ref 96–106)
Creatinine, Ser: 1.1 mg/dL — ABNORMAL HIGH (ref 0.57–1.00)
Glucose: 121 mg/dL — ABNORMAL HIGH (ref 70–99)
Potassium: 4.2 mmol/L (ref 3.5–5.2)
Sodium: 139 mmol/L (ref 134–144)
eGFR: 48 mL/min/{1.73_m2} — ABNORMAL LOW (ref >59)

## 2023-09-16 LAB — IRON,TIBC AND FERRITIN PANEL
Ferritin: 81 ng/mL (ref 15–150)
Iron Saturation: 29 % (ref 15–55)
Iron: 102 ug/dL (ref 27–139)
Total Iron Binding Capacity: 356 ug/dL (ref 250–450)
UIBC: 254 ug/dL (ref 118–369)

## 2023-09-16 NOTE — Progress Notes (Signed)
Internal Medicine Clinic Attending  Case discussed with the resident at the time of the visit.  We reviewed the resident's history and exam and pertinent patient test results.  I agree with the assessment, diagnosis, and plan of care documented in the resident's note.    I am reassured by today's EKG (no Afib, no ischemic changes), normal glucose, and normal basic labs. She is not have infectious symptoms.    I am not sure what the etiology of this patient's bouts of fatigue was.  Since her daughter reports change in pulse rate, I have a low threshold for cardiac monitor if this persists.

## 2023-09-16 NOTE — Progress Notes (Signed)
CC: Fatigue  HPI:  Christine [name] is a 89 y.o. female living with a history stated below and presents today for fatigue. Please see problem based assessment and plan for additional details.  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 LHC 08/23/11: dLM 40-50%, oRI 40%, oCFX 40%, oD1 70% (small and not amenable to PCI).  LM lesion did not appear to be flow limiting.  Medical rx was recommended;  b. Echo 08/23/11: mild LVH, EF 60-65%, grade 1 diast dysfxn, mild BAE, PASP 24;  c. 02/2012  Cath: LM 50d, LAD 50p, D1 70ost, RI 70p, RCA ok->Med Rx;  d. 01/2013 Cardiolite: EF 88, no ischemia/infarct.   Carotid stenosis    a. dopplers 10/12:  0-39% bilat ICA;  b. 10/2012 U/S: 0-39% bilat, f/u 1 yr (10/2013).   Chronic bronchitis    Chronotropic incompetence 05/01/2021   Depression    Diverticulosis of colon (without mention of hemorrhage)    GERD (gastroesophageal reflux disease)    Heart murmur, systolic    2-D echo in December 2011 showed a normal EF with grade 1 diastolic dysfunction, trivial pulmonary regurgitation and mildly elevated PA pressure at 37 mmHg probably secondary to her COPD.dynamic obstruction-mid cavity obliteration;  b. 02/2012 Echo: EF 60-65%, mild LVH, PASP .   History of stomach ulcers 1970's   Hyperlipidemia    Hypertension    Hypothyroidism    Migraines    a. Next atypical symptoms in the past. Patient was started on Neurontin for possible neuropathic origin of her pain.   Polymyalgia rheumatica (HCC)    Premature atrial contraction 05/01/2021   She remains asymptomatic.  No medication indicated.   Shortness of  breath on exertion    "just related to angina >1 yr ago" (09/23/2013)   Sinus arrhythmia    Solitary pulmonary nodule 12/15/2006   CT Chest in January 2008  IMPRESSION:  1. Stable CT of the chest with mild biapical scarring and scattered nodularity, most likely postinflammatory in the absence of a history of malignancy.  2. No acute chest findings are demonstrated.  3. Unless the patient has a history of malignancy or risk factors for lung cancer, the chest findings do not necessarily require any specific followup. If follow    Type II diabetes mellitus (HCC)    a. On oral hypoglycemic agents.    Current Outpatient Medications on File Prior to Visit  Medication Sig Dispense Refill   acetaminophen (TYLENOL) 500 MG tablet Take 1,000 mg by mouth every 6 (six) hours as needed for headache. For pain     acetaminophen-codeine (TYLENOL #3) 300-30 MG tablet Take 0.5-1 tablets by mouth every 8 (eight) hours as needed for moderate pain. 30 tablet 0   amLODipine (NORVASC) 10 MG tablet TAKE 1 TABLET(10 MG) BY MOUTH DAILY 90 tablet 3   Ascorbic Acid (VITAMIN C PO) Take 2 tablets by mouth daily.      atorvastatin (LIPITOR) 40 MG tablet TAKE 1/2 TABLET BY MOUTH EVERY EVENING 45 tablet 3   Blood Glucose Monitoring Suppl (ONETOUCH VERIO REFLECT) w/Device  KIT Check blood sugar one time daily before breakfast 1 kit 1   cholecalciferol 25 MCG (1000 UT) tablet Take 1,000 Units by mouth daily.     clopidogrel (PLAVIX) 75 MG tablet TAKE 1 TABLET BY MOUTH EVERY EVENING 90 tablet 3   famotidine (PEPCID) 20 MG tablet Take 1 tablet (20 mg total) by mouth 2 (two) times daily. Please schedule a yearly follow up for further refills.  Thank you 180 tablet 2   fluticasone (FLONASE) 50 MCG/ACT nasal spray SHAKE LIQUID AND USE 1 SPRAY IN EACH NOSTRIL EVERY DAY 16 g 2   fluticasone-salmeterol (WIXELA INHUB) 250-50 MCG/ACT AEPB Inhale 1 puff into the lungs in the morning and at bedtime. 60 each 3   glimepiride (AMARYL) 1 MG tablet  TAKE 1 TABLET BY MOUTH EVERY DAY BEFORE BREAKFAST 90 tablet 3   glucose blood (ONETOUCH VERIO) test strip Check blood sugar one time daily before breakfast 100 each 5   imipramine (TOFRANIL) 50 MG tablet TAKE 1 TABLET(50 MG) BY MOUTH TWICE DAILY 180 tablet 3   Lancet Devices (ONETOUCH DELICA PLUS LANCING) MISC Check blood sugar one time daily before breakfast 100 each 5   Lancets (ONETOUCH DELICA PLUS LANCET33G) MISC USE TO TEST ONCE DAILY 100 each 11   levothyroxine (SYNTHROID) 25 MCG tablet TAKE 1 TABLET BY MOUTH EVERY DAY 90 tablet 1   Multiple Vitamin (MULTIVITAMIN WITH MINERALS) TABS tablet Take 1 tablet by mouth daily. (Patient taking differently: Take 2 tablets by mouth daily.) 30 tablet 2   valsartan (DIOVAN) 320 MG tablet TAKE 1 TABLET(320 MG) BY MOUTH DAILY 90 tablet 3   No current facility-administered medications on file prior to visit.    Family History  Problem Relation Age of Onset   Colon cancer Maternal Grandmother    Heart attack Maternal Grandmother    COPD Father    Other Mother        died of unknown causes in her 74's. Pt raised by grandmother.   Pulmonary Hypertension Daughter     Social History   Socioeconomic History   Marital status: Widowed    Spouse name: Not on file   Number of children: 7   Years of education: Not on file   Highest education level: Not on file  Occupational History   Occupation: retired  Tobacco Use   Smoking status: Never   Smokeless tobacco: Never  Vaping Use   Vaping status: Never Used  Substance and Sexual Activity   Alcohol use: No    Comment: 11/29/2016 "Last drink 1967"   Drug use: No   Sexual activity: Not Currently  Other Topics Concern   Not on file  Social History Narrative   Never smoked. Lives in [place] with her dtr.  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   Social Determinants of Health   Financial Resource Strain: Low Risk  (05/16/2023)   Overall Financial Resource Strain  (CARDIA)    Difficulty of Paying Living Expenses: Not very hard  Food Insecurity: No Food Insecurity (05/16/2023)   Hunger Vital Sign    Worried About Running Out of Food in the Last Year: Never true    Ran Out of Food in the Last Year: Never true  Transportation Needs: No Transportation Needs (05/16/2023)   PRAPARE - Administrator, Civil Service (Medical): No    Lack of Transportation (Non-Medical): No  Physical Activity: Inactive (05/16/2023)   Exercise Vital Sign  Days of Exercise per Week: 0 days    Minutes of Exercise per Session: 0 min  Stress: No Stress Concern Present (05/16/2023)   [employer] of Occupational Health - Occupational Stress Questionnaire    Feeling of Stress : Only a little  Social Connections: Moderately Isolated (05/16/2023)   Social Connection and Isolation Panel [NHANES]    Frequency of Communication with Friends and Family: More than three times a week    Frequency of Social Gatherings with Friends and Family: More than three times a week    Attends Religious Services: 1 to 4 times per year    Active Member of [employer] or Organizations: No    Attends Banker Meetings: Never    Marital Status: Widowed  Intimate Partner Violence: Not At Risk (05/16/2023)   Humiliation, Afraid, Rape, and Kick questionnaire    Fear of Current or Ex-Partner: No    Emotionally Abused: No    Physically Abused: No    Sexually Abused: No    Review of Systems: ROS negative except for what is noted on the assessment and plan.  Vitals:   09/15/23 1428  BP: (!) 137/52  Pulse: 91  Temp: 98.2 F (36.8 C)  TempSrc: Oral  SpO2: 99%  Weight: 132 lb 4.8 oz (60 kg)  Height: 5\' 4"  (1.626 m)    Physical Exam: Constitutional: well-appearing female in no acute distress HENT: normocephalic atraumatic, mucous membranes moist Eyes: conjunctiva non-erythematous Neck: supple Cardiovascular: regular rate and rhythm, no m/r/g Pulmonary/Chest: normal work of  breathing on room air, lungs clear to auscultation bilaterally Abdominal: soft, non-tender, non-distended MSK: normal bulk and tone Neurological: alert & oriented x 3, 5/5 strength in bilateral upper and lower extremities, normal gait Skin: Decreased skin turgor   Assessment & Plan:   Fatigue Patient presents for an acute visit for fatigue.  This started on Thursday where she states that she was "just not feeling well and really tired".  Daughter is also in the room and confirms this as well.  The fatigue would come on when she would try to get up and go to the bathroom, and on the way back from the bathroom she felt very weak.  The fatigue would go away when she would get back in bed.  Her symptoms resolved on Saturday, and she was eating well and drinking well.  Then, on Sunday morning around 3:30 AM she felt the same fatigue symptoms as well.  Her daughter states that she had taken her vitals at that time which showed blood pressure was 151/90, and a labile pulse ranging from 50-100 and back to 60 and then up again to 100.  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  Etiology currently unclear.  She does have a history of type 2 diabetes for which she takes a sulfonylurea, which could be responsible for the fatigue in the setting of hypoglycemia.  Also ranging pulses may signify an irregular heart rhythm.  She does have a history of iron deficiency anemia, however she denies any dizziness and on physical exam no signs of anemia such as conjunctival pallor.  Her last TSH was 4.8, for which she takes Synthroid 25 mcg for.  EKG performed in clinic showed sinus rhythm, with normal intervals and no signs of irregularities.  Patient also denies any sick contacts  or recent fevers, chills, or shortness of breath.  Will start workup with CBC, BMP, and iron  studies.  Plan: - CBC, BMP, and iron studies ordered - Encourage patient and daughter to check blood sugar if the symptoms recur - Encouraged patient to keep up with water intake    Patient discussed with Dr. Dierdre Forth Sebron Mcmahill, M.D. [facility] Internal Medicine, PGY-2 Pager: [phone] Date 09/16/2023 Time 2:07 PM

## 2023-09-16 NOTE — Assessment & Plan Note (Addendum)
Patient presents for an acute visit for fatigue.  This started on Thursday where she states that she was "just not feeling well and really tired".  Daughter is also in the room and confirms this as well.  The fatigue would come on when she would try to get up and go to the bathroom, and on the way back from the bathroom she felt very weak.  The fatigue would go away when she would get back in bed.  Her symptoms resolved on Saturday, and she was eating well and drinking well.  Then, on Sunday morning around 3:30 AM she felt the same fatigue symptoms as well.  Her daughter states that she had taken her vitals at that time which showed blood pressure was 151/90, and a labile pulse ranging from 50-100 and back to 60 and then up again to 100.  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  Etiology currently unclear.  She does have a history of type 2 diabetes for which she takes a sulfonylurea, which could be responsible for the fatigue in the setting of hypoglycemia.  Also ranging pulses may signify an irregular heart rhythm.  She does have a history of iron deficiency anemia, however she denies any dizziness and on physical exam no signs of anemia such as conjunctival pallor.  Her last TSH was 4.8, for which she takes Synthroid 25 mcg for.  EKG performed in clinic showed sinus rhythm, with normal intervals and no signs of irregularities.  Patient also denies any sick contacts or recent fevers, chills, or shortness of breath.  Will start workup with CBC, BMP, and iron studies.  Plan: - CBC, BMP, and iron studies ordered - Encourage patient and daughter to check blood sugar if the symptoms recur - Encouraged patient to keep up with water intake

## 2023-09-16 NOTE — Addendum Note (Signed)
Addended by: Derrek Monaco on: 09/16/2023 02:51 PM   Modules accepted: Level of Service

## 2023-09-23 ENCOUNTER — Encounter: Payer: Self-pay | Admitting: Internal Medicine

## 2023-10-10 ENCOUNTER — Ambulatory Visit: Payer: Self-pay | Admitting: *Deleted

## 2023-10-10 NOTE — Patient Outreach (Addendum)
  Care Coordination   10/10/2023 Name: Christine [name] MRN: [id number] DOB: 06/17/1933   Care Coordination Outreach Attempts:  An unsuccessful telephone outreach was attempted for a scheduled appointment today.  Call attempted twice, unable to leave voice message for daughter.   Follow Up Plan:  Additional outreach attempts will be made to offer the patient care coordination information and services.   Encounter Outcome:  No Answer   Care Coordination Interventions:  No, not indicated    Rodney Langton, RN, MSN, CCM [place]  [facility], [facility] RN Care Coordinator Direct Dial: [phone] / Main [phone] Fax [phone] Email: [email or web address]

## 2023-10-27 ENCOUNTER — Encounter (HOSPITAL_COMMUNITY): Payer: Self-pay

## 2023-10-27 ENCOUNTER — Other Ambulatory Visit: Payer: Self-pay

## 2023-10-27 ENCOUNTER — Ambulatory Visit: Payer: Medicare Other | Admitting: Internal Medicine

## 2023-10-27 ENCOUNTER — Inpatient Hospital Stay (HOSPITAL_COMMUNITY)
Admission: AD | Admit: 2023-10-27 | Discharge: 2023-10-29 | DRG: 312 | Disposition: A | Discharge status: Home / Self Care | Payer: Medicare Other | Source: Ambulatory Visit | Attending: Internal Medicine | Admitting: Internal Medicine

## 2023-10-27 ENCOUNTER — Telehealth: Payer: Self-pay | Admitting: *Deleted

## 2023-10-27 VITALS — BP 169/153 | HR 114 | Temp 98.7°F | Ht 64.0 in | Wt 132.0 lb

## 2023-10-27 DIAGNOSIS — Z79899 Other long term (current) drug therapy: Secondary | ICD-10-CM

## 2023-10-27 DIAGNOSIS — K219 Gastro-esophageal reflux disease without esophagitis: Secondary | ICD-10-CM | POA: Diagnosis present

## 2023-10-27 DIAGNOSIS — E785 Hyperlipidemia, unspecified: Secondary | ICD-10-CM | POA: Diagnosis not present

## 2023-10-27 DIAGNOSIS — F329 Major depressive disorder, single episode, unspecified: Secondary | ICD-10-CM | POA: Diagnosis present

## 2023-10-27 DIAGNOSIS — I951 Orthostatic hypotension: Secondary | ICD-10-CM | POA: Diagnosis not present

## 2023-10-27 DIAGNOSIS — Z7984 Long term (current) use of oral hypoglycemic drugs: Secondary | ICD-10-CM | POA: Diagnosis not present

## 2023-10-27 DIAGNOSIS — J449 Chronic obstructive pulmonary disease, unspecified: Secondary | ICD-10-CM | POA: Diagnosis present

## 2023-10-27 DIAGNOSIS — I2510 Atherosclerotic heart disease of native coronary artery without angina pectoris: Secondary | ICD-10-CM | POA: Diagnosis present

## 2023-10-27 DIAGNOSIS — I4892 Unspecified atrial flutter: Secondary | ICD-10-CM | POA: Diagnosis not present

## 2023-10-27 DIAGNOSIS — M353 Polymyalgia rheumatica: Secondary | ICD-10-CM | POA: Diagnosis not present

## 2023-10-27 DIAGNOSIS — Z7902 Long term (current) use of antithrombotics/antiplatelets: Secondary | ICD-10-CM

## 2023-10-27 DIAGNOSIS — Z880 Allergy status to penicillin: Secondary | ICD-10-CM | POA: Diagnosis not present

## 2023-10-27 DIAGNOSIS — I483 Typical atrial flutter: Principal | ICD-10-CM | POA: Diagnosis present

## 2023-10-27 DIAGNOSIS — Z92241 Personal history of systemic steroid therapy: Secondary | ICD-10-CM

## 2023-10-27 DIAGNOSIS — I5032 Chronic diastolic (congestive) heart failure: Secondary | ICD-10-CM | POA: Diagnosis present

## 2023-10-27 DIAGNOSIS — E876 Hypokalemia: Secondary | ICD-10-CM | POA: Diagnosis present

## 2023-10-27 DIAGNOSIS — E039 Hypothyroidism, unspecified: Secondary | ICD-10-CM | POA: Diagnosis present

## 2023-10-27 DIAGNOSIS — Z885 Allergy status to narcotic agent status: Secondary | ICD-10-CM | POA: Diagnosis not present

## 2023-10-27 DIAGNOSIS — Z800 Family history of malignant neoplasm of digestive organs: Secondary | ICD-10-CM

## 2023-10-27 DIAGNOSIS — Z886 Allergy status to analgesic agent status: Secondary | ICD-10-CM

## 2023-10-27 DIAGNOSIS — E2740 Unspecified adrenocortical insufficiency: Secondary | ICD-10-CM | POA: Diagnosis present

## 2023-10-27 DIAGNOSIS — Z888 Allergy status to other drugs, medicaments and biological substances status: Secondary | ICD-10-CM

## 2023-10-27 DIAGNOSIS — Z79890 Hormone replacement therapy (postmenopausal): Secondary | ICD-10-CM | POA: Diagnosis not present

## 2023-10-27 DIAGNOSIS — Z7951 Long term (current) use of inhaled steroids: Secondary | ICD-10-CM

## 2023-10-27 DIAGNOSIS — I130 Hypertensive heart and chronic kidney disease with heart failure and stage 1 through stage 4 chronic kidney disease, or unspecified chronic kidney disease: Secondary | ICD-10-CM | POA: Diagnosis not present

## 2023-10-27 DIAGNOSIS — Z8489 Family history of other specified conditions: Secondary | ICD-10-CM

## 2023-10-27 DIAGNOSIS — N183 Chronic kidney disease, stage 3 unspecified: Secondary | ICD-10-CM | POA: Diagnosis present

## 2023-10-27 DIAGNOSIS — M316 Other giant cell arteritis: Secondary | ICD-10-CM | POA: Diagnosis not present

## 2023-10-27 DIAGNOSIS — Z7901 Long term (current) use of anticoagulants: Secondary | ICD-10-CM

## 2023-10-27 DIAGNOSIS — Z825 Family history of asthma and other chronic lower respiratory diseases: Secondary | ICD-10-CM

## 2023-10-27 DIAGNOSIS — Z8679 Personal history of other diseases of the circulatory system: Secondary | ICD-10-CM

## 2023-10-27 DIAGNOSIS — Z8249 Family history of ischemic heart disease and other diseases of the circulatory system: Secondary | ICD-10-CM

## 2023-10-27 LAB — BASIC METABOLIC PANEL
Anion gap: 9 (ref 5–15)
BUN: 27 mg/dL — ABNORMAL HIGH (ref 8–23)
CO2: 21 mmol/L — ABNORMAL LOW (ref 22–32)
Calcium: 9.1 mg/dL (ref 8.9–10.3)
Chloride: 109 mmol/L (ref 98–111)
Creatinine, Ser: 1.22 mg/dL — ABNORMAL HIGH (ref 0.44–1.00)
GFR, Estimated: 43 mL/min — ABNORMAL LOW (ref >60)
Glucose, Bld: 91 mg/dL (ref 70–99)
Potassium: 3.4 mmol/L — ABNORMAL LOW (ref 3.5–5.1)
Sodium: 139 mmol/L (ref 135–145)

## 2023-10-27 LAB — CBC
HCT: 35.4 % — ABNORMAL LOW (ref 36.0–46.0)
Hemoglobin: 11.8 g/dL — ABNORMAL LOW (ref 12.0–15.0)
MCH: 29.6 pg (ref 26.0–34.0)
MCHC: 33.3 g/dL (ref 30.0–36.0)
MCV: 88.9 fL (ref 80.0–100.0)
Platelets: 315 10*3/uL (ref 150–400)
RBC: 3.98 MIL/uL (ref 3.87–5.11)
RDW: 14.7 % (ref 11.5–15.5)
WBC: 8 10*3/uL (ref 4.0–10.5)
nRBC: 0 % (ref 0.0–0.2)

## 2023-10-27 LAB — GLUCOSE, CAPILLARY
Glucose-Capillary: 179 mg/dL — ABNORMAL HIGH (ref 70–99)
Glucose-Capillary: 205 mg/dL — ABNORMAL HIGH (ref 70–99)

## 2023-10-27 LAB — MAGNESIUM: Magnesium: 2.3 mg/dL (ref 1.7–2.4)

## 2023-10-27 MED ORDER — LEVOTHYROXINE SODIUM 25 MCG PO TABS
25.0000 ug | ORAL_TABLET | Freq: Every day | ORAL | Status: DC
  Administered 2023-10-28 – 2023-10-29 (×2): 25 ug via ORAL
  Filled 2023-10-27 (×2): qty 1

## 2023-10-27 MED ORDER — ACETAMINOPHEN 650 MG RE SUPP: 650.0000 mg | Freq: Four times a day (QID) | RECTAL | Status: DC | PRN

## 2023-10-27 MED ORDER — ENOXAPARIN SODIUM 40 MG/0.4ML IJ SOSY
40.0000 mg | PREFILLED_SYRINGE | INTRAMUSCULAR | Status: DC
  Administered 2023-10-27: 40 mg via SUBCUTANEOUS
  Filled 2023-10-27: qty 0.4

## 2023-10-27 MED ORDER — LACTATED RINGERS IV BOLUS
500.0000 mL | Freq: Once | INTRAVENOUS | Status: AC
  Administered 2023-10-27: 500 mL via INTRAVENOUS

## 2023-10-27 MED ORDER — ATORVASTATIN CALCIUM 10 MG PO TABS
20.0000 mg | ORAL_TABLET | Freq: Every evening | ORAL | Status: DC
  Administered 2023-10-27 – 2023-10-28 (×2): 20 mg via ORAL
  Filled 2023-10-27 (×2): qty 2

## 2023-10-27 MED ORDER — FAMOTIDINE 20 MG PO TABS
20.0000 mg | ORAL_TABLET | Freq: Two times a day (BID) | ORAL | Status: DC
  Administered 2023-10-27: 20 mg via ORAL
  Filled 2023-10-27: qty 1

## 2023-10-27 MED ORDER — POTASSIUM CHLORIDE CRYS ER 20 MEQ PO TBCR
20.0000 meq | EXTENDED_RELEASE_TABLET | Freq: Once | ORAL | Status: AC
  Administered 2023-10-27: 20 meq via ORAL
  Filled 2023-10-27: qty 1

## 2023-10-27 MED ORDER — CLOPIDOGREL BISULFATE 75 MG PO TABS
75.0000 mg | ORAL_TABLET | Freq: Every evening | ORAL | Status: DC
  Administered 2023-10-27: 75 mg via ORAL
  Filled 2023-10-27: qty 1

## 2023-10-27 MED ORDER — IMIPRAMINE HCL 25 MG PO TABS
50.0000 mg | ORAL_TABLET | Freq: Two times a day (BID) | ORAL | Status: DC
  Administered 2023-10-27 – 2023-10-29 (×4): 50 mg via ORAL
  Filled 2023-10-27 (×5): qty 2

## 2023-10-27 MED ORDER — SENNOSIDES-DOCUSATE SODIUM 8.6-50 MG PO TABS: 1.0000 | ORAL_TABLET | Freq: Every evening | ORAL | Status: DC | PRN

## 2023-10-27 MED ORDER — MOMETASONE FURO-FORMOTEROL FUM 200-5 MCG/ACT IN AERO
2.0000 | INHALATION_SPRAY | Freq: Two times a day (BID) | RESPIRATORY_TRACT | Status: DC
  Administered 2023-10-28 – 2023-10-29 (×3): 2 via RESPIRATORY_TRACT
  Filled 2023-10-27: qty 8.8

## 2023-10-27 MED ORDER — ACETAMINOPHEN 325 MG PO TABS: 650.0000 mg | ORAL_TABLET | Freq: Four times a day (QID) | ORAL | Status: DC | PRN

## 2023-10-27 NOTE — Assessment & Plan Note (Addendum)
The patient presents for an acute visit today after not feeling well for the last few days. She states that since late last week she has felt dizzy when she gets up or walks around. She denies any fevers, chills, vision changes, syncope, falls, chest pain, SOB, abd pain, vomiting, diarrhea, or urinary symptoms. She does state that she has some dyspnea on exertion, with very little exertion triggering some shortness of breath. She also is nauseous during her dizzy spells, but denies any changes to her appetite. She states she has been eating and drinking well. She also denies any weakness, changes in speech, or other focal neurologic deficits. She took her amlodipine this morning but has not taken her valsartan (usually takes this in the evening).   BP today: 123/54 lying down 118/80 sitting 95/55 standing 121/53 standing after 3 minutes  The patient is experiencing orthostatic hypotension and is symptomatic (dizziness). She would benefit from being admitted to the hospital for IV fluids and further workup. I wonder if she has a component of adrenal insufficiency given her history of GCA and PMR and history of being on steroids for long periods of times. I advised the patient to hold her valsartan today and additionally, if she feels worse before she gets a call from the hospital, to report to the ED. The patient, her daughter, and her son understand and are in agreement.

## 2023-10-27 NOTE — Progress Notes (Signed)
CC: not feeling well  HPI:  [name] is a 89 y.o. female living with a history stated below and presents today for an acute visit, as she has not been feeling well for the last few days. Please see problem based assessment and plan for additional details.  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 LHC 08/23/11: dLM 40-50%, oRI 40%, oCFX 40%, oD1 70% (small and not amenable to PCI).  LM lesion did not appear to be flow limiting.  Medical rx was recommended;  b. Echo 08/23/11: mild LVH, EF 60-65%, grade 1 diast dysfxn, mild BAE, PASP 24;  c. 02/2012  Cath: LM 50d, LAD 50p, D1 70ost, RI 70p, RCA ok->Med Rx;  d. 01/2013 Cardiolite: EF 88, no ischemia/infarct.   Carotid stenosis    a. dopplers 10/12:  0-39% bilat ICA;  b. 10/2012 U/S: 0-39% bilat, f/u 1 yr (10/2013).   Chronic bronchitis    Chronotropic incompetence 05/01/2021   Depression    Diverticulosis of colon (without mention of hemorrhage)    GERD (gastroesophageal reflux disease)    Heart murmur, systolic    2-D echo in December 2011 showed a normal EF with grade 1 diastolic dysfunction, trivial pulmonary regurgitation and mildly elevated PA pressure at 37 mmHg probably secondary to her COPD.dynamic obstruction-mid cavity obliteration;  b. 02/2012 Echo: EF 60-65%, mild LVH, PASP .   History of stomach ulcers 1970's   Hyperlipidemia    Hypertension    Hypothyroidism    Migraines    a. Next atypical symptoms in the past. Patient was started on Neurontin for possible neuropathic origin of her pain.   Polymyalgia rheumatica (HCC)    Premature atrial contraction 05/01/2021    She remains asymptomatic.  No medication indicated.   Shortness of breath on exertion    "just related to angina >1 yr ago" (09/23/2013)   Sinus arrhythmia    Solitary pulmonary nodule 12/15/2006   CT Chest in January 2008  IMPRESSION:  1. Stable CT of the chest with mild biapical scarring and scattered nodularity, most likely postinflammatory in the absence of a history of malignancy.  2. No acute chest findings are demonstrated.  3. Unless the patient has a history of malignancy or risk factors for lung cancer, the chest findings do not necessarily require any specific followup. If follow    Type II diabetes mellitus (HCC)    a. On oral hypoglycemic agents.    Current Outpatient Medications on File Prior to Visit  Medication Sig Dispense Refill   acetaminophen (TYLENOL) 500 MG tablet Take 1,000 mg by mouth every 6 (six) hours as needed for headache. For pain     acetaminophen-codeine (TYLENOL #3) 300-30 MG tablet Take 0.5-1 tablets by mouth every 8 (eight) hours as needed for moderate pain. 30 tablet 0   amLODipine (NORVASC) 10 MG tablet TAKE 1 TABLET(10 MG) BY MOUTH DAILY 90 tablet 3   Ascorbic Acid (VITAMIN C PO) Take 2 tablets by mouth daily.      atorvastatin (LIPITOR) 40 MG tablet TAKE 1/2 TABLET BY  MOUTH EVERY EVENING 45 tablet 3   Blood Glucose Monitoring Suppl (ONETOUCH VERIO REFLECT) w/Device KIT Check blood sugar one time daily before breakfast 1 kit 1   cholecalciferol 25 MCG (1000 UT) tablet Take 1,000 Units by mouth daily.     clopidogrel (PLAVIX) 75 MG tablet TAKE 1 TABLET BY MOUTH EVERY EVENING 90 tablet 3   famotidine (PEPCID) 20 MG tablet Take 1 tablet (20 mg total) by mouth 2 (two) times daily. Please schedule a yearly follow up for further refills.  Thank you 180 tablet 2   fluticasone (FLONASE) 50 MCG/ACT nasal spray SHAKE LIQUID AND USE 1 SPRAY IN EACH NOSTRIL EVERY DAY 16 g 2   fluticasone-salmeterol (WIXELA INHUB) 250-50 MCG/ACT AEPB Inhale 1 puff into the lungs in the  morning and at bedtime. 60 each 3   glimepiride (AMARYL) 1 MG tablet TAKE 1 TABLET BY MOUTH EVERY DAY BEFORE BREAKFAST 90 tablet 3   glucose blood (ONETOUCH VERIO) test strip Check blood sugar one time daily before breakfast 100 each 5   imipramine (TOFRANIL) 50 MG tablet TAKE 1 TABLET(50 MG) BY MOUTH TWICE DAILY 180 tablet 3   Lancet Devices (ONETOUCH DELICA PLUS LANCING) MISC Check blood sugar one time daily before breakfast 100 each 5   Lancets (ONETOUCH DELICA PLUS LANCET33G) MISC USE TO TEST ONCE DAILY 100 each 11   levothyroxine (SYNTHROID) 25 MCG tablet TAKE 1 TABLET BY MOUTH EVERY DAY 90 tablet 1   Multiple Vitamin (MULTIVITAMIN WITH MINERALS) TABS tablet Take 1 tablet by mouth daily. (Patient taking differently: Take 2 tablets by mouth daily.) 30 tablet 2   valsartan (DIOVAN) 320 MG tablet TAKE 1 TABLET(320 MG) BY MOUTH DAILY 90 tablet 3   No current facility-administered medications on file prior to visit.    Family History  Problem Relation Age of Onset   Colon cancer Maternal Grandmother    Heart attack Maternal Grandmother    COPD Father    Other Mother        died of unknown causes in her 76's. Pt raised by grandmother.   Pulmonary Hypertension Daughter     Social History   Socioeconomic History   Marital status: Widowed    Spouse name: Not on file   Number of children: 7   Years of education: Not on file   Highest education level: Not on file  Occupational History   Occupation: retired  Tobacco Use   Smoking status: Never   Smokeless tobacco: Never  Vaping Use   Vaping status: Never Used  Substance and Sexual Activity   Alcohol use: No    Comment: 11/29/2016 "Last drink 1967"   Drug use: No   Sexual activity: Not Currently  Other Topics Concern   Not on file  Social History Narrative   Never smoked. Lives in [place] with her dtr.  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   Social Determinants of Health   Financial  Resource Strain: Low Risk  (05/16/2023)   Overall Financial Resource Strain (CARDIA)    Difficulty of Paying Living Expenses: Not very hard  Food Insecurity: No Food Insecurity (05/16/2023)   Hunger Vital Sign    Worried About Running Out of Food in the Last Year: Never true    Ran Out of Food in the Last Year: Never true  Transportation Needs: No Transportation Needs (05/16/2023)   PRAPARE - Transportation    Lack of Transportation (Medical): No    Lack  of Transportation (Non-Medical): No  Physical Activity: Inactive (05/16/2023)   Exercise Vital Sign    Days of Exercise per Week: 0 days    Minutes of Exercise per Session: 0 min  Stress: No Stress Concern Present (05/16/2023)   [employer] of Occupational Health - Occupational Stress Questionnaire    Feeling of Stress : Only a little  Social Connections: Moderately Isolated (05/16/2023)   Social Connection and Isolation Panel [NHANES]    Frequency of Communication with Friends and Family: More than three times a week    Frequency of Social Gatherings with Friends and Family: More than three times a week    Attends Religious Services: 1 to 4 times per year    Active Member of [employer] or Organizations: No    Attends Banker Meetings: Never    Marital Status: Widowed  Intimate Partner Violence: Not At Risk (05/16/2023)   Humiliation, Afraid, Rape, and Kick questionnaire    Fear of Current or Ex-Partner: No    Emotionally Abused: No    Physically Abused: No    Sexually Abused: No    Review of Systems: ROS negative except for what is noted on the assessment and plan.  Vitals:   10/27/23 1037  BP: (!) 169/153  Pulse: (!) 114  Temp: 98.7 F (37.1 C)  TempSrc: Oral  SpO2: 100%  Weight: 132 lb (59.9 kg)  Height: 5\' 4"  (1.626 m)    Physical Exam: Constitutional: ill appearing elderly female Cardiovascular: tachycardic rate and regular rhythm, systolic murmur appreciated Pulmonary/Chest: normal work of breathing on  room air, lungs clear to auscultation bilaterally MSK: normal bulk and tone Neurological: alert & oriented x 3, CN II-XII intact. 4/5 strength bilateral upper and lower extremities. Speech is normal.  Skin: warm and dry Psych: normal mood and behavior  Assessment & Plan:    Patient discussed with Dr. Antony Contras  Orthostatic hypotension The patient presents for an acute visit today after not feeling well for the last few days. She states that since late last week she has felt dizzy when she gets up or walks around. She denies any fevers, chills, vision changes, syncope, falls, chest pain, SOB, abd pain, vomiting, diarrhea, or urinary symptoms. She does state that she has some dyspnea on exertion, with very little exertion triggering some shortness of breath. She also is nauseous during her dizzy spells, but denies any changes to her appetite. She states she has been eating and drinking well. She also denies any weakness, changes in speech, or other focal neurologic deficits. She took her amlodipine this morning but has not taken her valsartan (usually takes this in the evening).   BP today: 123/54 lying down 118/80 sitting 95/55 standing 121/53 standing after 3 minutes  The patient is experiencing orthostatic hypotension and is symptomatic (dizziness). She would benefit from being admitted to the hospital for IV fluids and further workup. I wonder if she has a component of adrenal insufficiency given her history of GCA and PMR and history of being on steroids for long periods of times. I advised the patient to hold her valsartan today and additionally, if she feels worse before she gets a call from the hospital, to report to the ED. The patient, her daughter, and her son understand and are in agreement.    Christine [name], D.O. [facility] Internal Medicine, PGY-3 Phone: [phone] Date 10/27/2023 Time 11:43 AM

## 2023-10-27 NOTE — Telephone Encounter (Signed)
Call from patient's daughter states patient is not feeling well.  Gets dizzy when she gets up.  Has been going on all weekend.  Just feels sick.  No fevers.  Given an appointment for 10:15 AM today.. Daughter to bring patient.

## 2023-10-27 NOTE — Plan of Care (Signed)

## 2023-10-27 NOTE — Patient Instructions (Signed)
Thank you, Ms.Bliss R Masella for allowing [place] to provide your care today. Today we discussed:  Orthostatic hypotension Your blood pressure dropped when you changed positions and this is why you are feeling dizzy We are going to admit you to the hospital to run more tests and to get IV fluids The hospital will call you when they have a bed available for you - this may be around lunch time   I have ordered the following labs for you:  Lab Orders  No laboratory test(s) ordered today      Referrals ordered today:   Referral Orders  No referral(s) requested today     I have ordered the following medication/changed the following medications:   Stop the following medications: There are no discontinued medications.   Start the following medications: No orders of the defined types were placed in this encounter.    Should you have any questions or concerns please call the internal medicine clinic at [phone].     Elza Rafter, D.O. [facility]

## 2023-10-27 NOTE — H&P (Cosign Needed Addendum)
Date: 10/27/2023               Patient Name:  Christine [name] MRN: [id number]  DOB: 1933/03/10 Age / Sex: 89 y.o., female   PCP: Default, Provider, MD         Medical Service: Internal Medicine Teaching Service         Attending Physician: Dr. Gust Rung, DO      First Contact: Dr. Jeral Pinch, DO Pager [phone]    Second Contact: Dr. Rana Snare, DO Pager [phone]         After Hours (After 5p/  First Contact Pager: [phone]  weekends / holidays): Second Contact Pager: [phone]   SUBJECTIVE   Chief Complaint: "Dizziness"  History of Present Illness: Christine [name] is a 89 y.o. female with past medical history of hypertension, COPD, GERD, CAD, hyperlipidemia, hypothyroidism, giant cell arteritis, PMR was seen at the [facility] today with concerns of orthostatic vitals admitted directly for further work up.   Patient reported symptoms of dizziness and lightheadedness since last week, they have been progressively worsening.  She does endorse shortness of breath with exertion and episode of dizziness. Denies any room spinning sensation, reports feeling more lightheaded with changes in position and feeling weak.  She denies any fall or loss of conscious.  Denies any headaches, chest pain, nausea, vomiting, abdominal pain.  Denies any diarrhea.  Denies any recent illness or sick contacts.  Denies any urinary symptoms.  States that she has chronic lightheadedness and dizziness however it has been progressively getting worse for the past week, with daily episodes.  She reports adequate fluid intake and good appetite.  Denies any changes in medications.  She is compliant on her medications.   ED Course: Direct admission from [facility]  Meds:  -amlodipine 10 mg  -atorvastatin 20 mg  -vitamin D daily -Plavix 75 mg daily -Pepcid 20 mg in AM -Flonase daily -Wixela BID -glimepiride 1 mg daily -imipramine 50 mg BID -levothyroxine 25 mcg in AM -claritin 10 mg at bedtime -valsartan 320 mg at  night -prednisone 20 mg (took one yesterday, not consistently taking it and only during flare ups)  Past Medical History -HTN -COPD -GERD -CAD -HLD -Hypothyroidism -PMR -giant cell arteritis   Past Medical History:  Diagnosis Date   Anemia    Anginal pain (HCC)    Arthritis    "back, arms, hips; hands" (11/30/2015)   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 LHC 08/23/11: dLM 40-50%, oRI 40%, oCFX 40%, oD1 70% (small and not amenable to PCI).  LM lesion did not appear to be flow limiting.  Medical rx was recommended;  b. Echo 08/23/11: mild LVH, EF 60-65%, grade 1 diast dysfxn, mild BAE, PASP 24;  c. 02/2012  Cath: LM 50d, LAD 50p, D1 70ost, RI 70p, RCA ok->Med Rx;  d. 01/2013 Cardiolite: EF 88, no ischemia/infarct.   Carotid stenosis    a. dopplers 10/12:  0-39% bilat ICA;  b. 10/2012 U/S: 0-39% bilat, f/u 1 yr (10/2013).   Chronic bronchitis    Chronotropic incompetence 05/01/2021   Depression    Diverticulosis of colon (without mention of hemorrhage)    GERD (gastroesophageal reflux disease)    Heart murmur, systolic  2-D echo in December 2011 showed a normal EF with grade 1 diastolic dysfunction, trivial pulmonary regurgitation and mildly elevated PA pressure at 37 mmHg probably secondary to her COPD.dynamic obstruction-mid cavity obliteration;  b. 02/2012 Echo: EF 60-65%, mild LVH, PASP .   History of stomach ulcers 1970's   Hyperlipidemia    Hypertension    Hypothyroidism    Migraines    a. Next atypical symptoms in the past. Patient was started on Neurontin for possible neuropathic origin of her pain.   Polymyalgia rheumatica (HCC)    Premature atrial contraction 05/01/2021   She remains asymptomatic.  No medication indicated.   Shortness of  breath on exertion    "just related to angina >1 yr ago" (09/23/2013)   Sinus arrhythmia    Solitary pulmonary nodule 12/15/2006   CT Chest in January 2008  IMPRESSION:  1. Stable CT of the chest with mild biapical scarring and scattered nodularity, most likely postinflammatory in the absence of a history of malignancy.  2. No acute chest findings are demonstrated.  3. Unless the patient has a history of malignancy or risk factors for lung cancer, the chest findings do not necessarily require any specific followup. If follow    Type II diabetes mellitus (HCC)    a. On oral hypoglycemic agents.    Past Surgical History:  Procedure Laterality Date   CARDIAC CATHETERIZATION N/A 09/26/2015   Procedure: Right/Left Heart Cath and Coronary Angiography;  Surgeon: Laurey Morale, MD;  Location: [facility] INVASIVE CV LAB;  Service: Cardiovascular;  Laterality: N/A;   CATARACT EXTRACTION W/ INTRAOCULAR LENS  IMPLANT, BILATERAL Bilateral 1990's   LEFT HEART CATHETERIZATION WITH CORONARY ANGIOGRAM N/A 03/16/2012   Procedure: LEFT HEART CATHETERIZATION WITH CORONARY ANGIOGRAM;  Surgeon: Herby Abraham, MD;  Location: [facility] CATH LAB;  Service: Cardiovascular;  Laterality: N/A;   RIGHT/LEFT HEART CATH AND CORONARY ANGIOGRAPHY N/A 08/12/2017   Procedure: RIGHT/LEFT HEART CATH AND CORONARY ANGIOGRAPHY;  Surgeon: [place], Peter M, MD;  Location: [facility] INVASIVE CV LAB;  Service: Cardiovascular;  Laterality: N/A;   VAGINAL HYSTERECTOMY  1976    Social:  Lives With: daughter  Support: children  Level of Function: uses walker for past week, otherwise independent on ADLs.  PCP: Dr. Mayford Knife (previously Dr. Criselda Peaches) Substances: -Tobacco: denies -Alcohol: denies -Recreational Drug: denies   Family History:  Family History  Problem Relation Age of Onset   Colon cancer Maternal Grandmother    Heart attack Maternal Grandmother    COPD Father    Other Mother        died of unknown causes in her 60's. Pt raised by  grandmother.   Pulmonary Hypertension Daughter    Allergies: Allergies as of 10/27/2023 - Review Complete 10/27/2023  Allergen Reaction Noted   Aspirin Swelling and Other (See Comments)    Atarax [hydroxyzine] Other (See Comments) 12/18/2016   Metoprolol Other (See Comments) 08/04/2018   Nsaids Other (See Comments) 11/25/2016   Codeine Nausea And Vomiting    Haldol [haloperidol] Other (See Comments) 12/06/2016   Penicillins Diarrhea 12/29/2011   Monosodium glutamate Nausea Only and Other (See Comments) 10/27/2023    Review of Systems: A complete ROS was negative except as per HPI.   OBJECTIVE:   Physical Exam: Blood pressure 139/65, pulse (!) 102, temperature 98.1 F (36.7 C), temperature source Oral, resp. rate 18, height 5\' 4"  (1.626 m), weight 58.8 kg, last menstrual period 02/11/1975, SpO2 100%.   Constitutional: Well-appearing, laying in bed comfortably, no acute distress HENT:  normocephalic atraumatic, mucous membranes moist Eyes: conjunctiva non-erythematous Cardiovascular: regular rate and rhythm, no m/r/g, no lower extremity edema bilaterally, 2+ DP pulses intact bilaterally. Pulmonary/Chest: normal work of breathing on room air, lungs clear to auscultation bilaterally Abdominal: soft, non-tender, non-distended, bowel sounds present MSK: normal bulk and tone Neurological: alert & oriented x 3, 5/5 strength in bilateral upper and lower extremities Skin: warm and dry Psych: Normal mood and affect  Labs: CBC    Component Value Date/Time   WBC 8.0 10/27/2023 1806   RBC 3.98 10/27/2023 1806   HGB 11.8 (L) 10/27/2023 1806   HGB 11.9 09/15/2023 1602   HCT 35.4 (L) 10/27/2023 1806   HCT 36.0 09/15/2023 1602   PLT 315 10/27/2023 1806   PLT 343 09/15/2023 1602   MCV 88.9 10/27/2023 1806   MCV 87 09/15/2023 1602   MCH 29.6 10/27/2023 1806   MCHC 33.3 10/27/2023 1806   RDW 14.7 10/27/2023 1806   RDW 12.7 09/15/2023 1602   LYMPHSABS 1.8 09/15/2023 1602   MONOABS 0.9  03/21/2021 1118   EOSABS 0.0 09/15/2023 1602   BASOSABS 0.0 09/15/2023 1602     CMP     Component Value Date/Time   NA 139 09/15/2023 1602   K 4.2 09/15/2023 1602   CL 102 09/15/2023 1602   CO2 19 (L) 09/15/2023 1602   GLUCOSE 121 (H) 09/15/2023 1602   GLUCOSE 133 (H) 03/21/2021 1118   BUN 27 09/15/2023 1602   CREATININE 1.10 (H) 09/15/2023 1602   CREATININE 1.18 (H) 09/21/2015 1434   CALCIUM 10.0 09/15/2023 1602   PROT 7.3 09/06/2022 1038   ALBUMIN 4.6 09/06/2022 1038   AST 23 09/06/2022 1038   ALT 19 09/06/2022 1038   ALKPHOS 185 (H) 09/06/2022 1038   BILITOT <0.2 09/06/2022 1038   GFRNONAA 60 (L) 03/21/2021 1118   GFRNONAA 50 (L) 12/21/2014 1058   GFRAA 61 06/23/2020 1138   GFRAA 57 (L) 12/21/2014 1058    Imaging: none  EKG: None  ASSESSMENT & PLAN:   Assessment & Plan by Problem: Principal Problem:   Orthostatic hypotension   Christine [name] is a 89 y.o. person living with a history of hypertension, COPD, GERD, CAD, hyperlipidemia, hypothyroidism, giant cell arteritis, PMR was seen at the [facility] today with concern of orthostatic vitals telemetry admitted for further workup.   #Orthostatic hypotension  #Elevated Cr  Presented with 1 week worsening of dizziness and lightheadedness, worsened with positional changes and weakness. No syncope or fall. No room spinning sensation. No headaches, nausea, vomiting, diarrhea. No changes in medications. Reports adequate po intake. No recent sickness. Orthostatic vitals in clinic noted as 123/54 lying down, 118/80 sitting, 95/55 standing at 0 min. Labs showed K 3.4, Cr 1.22 [1.10], normal WBC, hgb 11.8 [11.9]. She has a history of GCA and PMR for which she takes prednisone as needed for flare up. She took prednisone 20 mg last night, did take couple of doses in October, otherwise, is not on chronic prednisone therapy since last year. She does endorse chronic lightheadedness for about a year, though worsening this past couple of days.  Will do a workup for adrenal insufficiency. She is s/p 500 cc LR; etiology currently unclear. She is currently doing well.   Plan:  - Recheck Orthostatic vitals tomorrow - F/u AM cortisol - F/u TSH -Will currently hold BP medications  #Hypokalemia Presented with K 3.4, no GI losses noted. Reports adequate PO intake. Will check Mg and replete.   Chronic conditions COPD:  Continue home medication Dulera Hyperlipidemia: Continue home medication Lipitor 20 mg daily Hypothyroidism: Continue home medication Synthroid 25 mcg daily GERD: Continue home medication Pepcid 20 mg twice daily MDD: Per chart review, stopping TCA resulted psychosis.  Will continue TCA  Diet: Heart Healthy VTE:  Lovebox IVF: None,None Code: Full  Prior to Admission Living Arrangement: Home, living daughter Anticipated Discharge Location: Home Barriers to Discharge: medical management   Dispo: Admit patient to Observation with expected length of stay less than 2 midnights.  Signed: Rana Snare, DO Internal Medicine Resident PGY-2 Pager: [phone] 10/27/2023, 6:53 PM   Please contact on-call pager, weekdays after 5pm and weekends after 1pm, at [phone].

## 2023-10-28 ENCOUNTER — Other Ambulatory Visit (HOSPITAL_COMMUNITY): Payer: Self-pay

## 2023-10-28 ENCOUNTER — Telehealth (HOSPITAL_COMMUNITY): Payer: Self-pay | Admitting: Pharmacy Technician

## 2023-10-28 ENCOUNTER — Observation Stay (HOSPITAL_COMMUNITY): Payer: Medicare Other

## 2023-10-28 DIAGNOSIS — Z92241 Personal history of systemic steroid therapy: Secondary | ICD-10-CM | POA: Diagnosis not present

## 2023-10-28 DIAGNOSIS — Z8679 Personal history of other diseases of the circulatory system: Secondary | ICD-10-CM

## 2023-10-28 DIAGNOSIS — M316 Other giant cell arteritis: Secondary | ICD-10-CM | POA: Diagnosis present

## 2023-10-28 DIAGNOSIS — Z79890 Hormone replacement therapy (postmenopausal): Secondary | ICD-10-CM | POA: Diagnosis not present

## 2023-10-28 DIAGNOSIS — I130 Hypertensive heart and chronic kidney disease with heart failure and stage 1 through stage 4 chronic kidney disease, or unspecified chronic kidney disease: Secondary | ICD-10-CM | POA: Diagnosis present

## 2023-10-28 DIAGNOSIS — I4892 Unspecified atrial flutter: Secondary | ICD-10-CM | POA: Diagnosis not present

## 2023-10-28 DIAGNOSIS — I2510 Atherosclerotic heart disease of native coronary artery without angina pectoris: Secondary | ICD-10-CM | POA: Diagnosis present

## 2023-10-28 DIAGNOSIS — E039 Hypothyroidism, unspecified: Secondary | ICD-10-CM | POA: Diagnosis present

## 2023-10-28 DIAGNOSIS — E2740 Unspecified adrenocortical insufficiency: Secondary | ICD-10-CM | POA: Diagnosis present

## 2023-10-28 DIAGNOSIS — Z888 Allergy status to other drugs, medicaments and biological substances status: Secondary | ICD-10-CM | POA: Diagnosis not present

## 2023-10-28 DIAGNOSIS — Z880 Allergy status to penicillin: Secondary | ICD-10-CM | POA: Diagnosis not present

## 2023-10-28 DIAGNOSIS — E785 Hyperlipidemia, unspecified: Secondary | ICD-10-CM | POA: Diagnosis present

## 2023-10-28 DIAGNOSIS — Z7902 Long term (current) use of antithrombotics/antiplatelets: Secondary | ICD-10-CM | POA: Diagnosis not present

## 2023-10-28 DIAGNOSIS — Z886 Allergy status to analgesic agent status: Secondary | ICD-10-CM | POA: Diagnosis not present

## 2023-10-28 DIAGNOSIS — K219 Gastro-esophageal reflux disease without esophagitis: Secondary | ICD-10-CM | POA: Diagnosis present

## 2023-10-28 DIAGNOSIS — J449 Chronic obstructive pulmonary disease, unspecified: Secondary | ICD-10-CM | POA: Diagnosis present

## 2023-10-28 DIAGNOSIS — I483 Typical atrial flutter: Secondary | ICD-10-CM

## 2023-10-28 DIAGNOSIS — I951 Orthostatic hypotension: Principal | ICD-10-CM

## 2023-10-28 DIAGNOSIS — Z7901 Long term (current) use of anticoagulants: Secondary | ICD-10-CM | POA: Diagnosis not present

## 2023-10-28 DIAGNOSIS — Z79899 Other long term (current) drug therapy: Secondary | ICD-10-CM | POA: Diagnosis not present

## 2023-10-28 DIAGNOSIS — Z7984 Long term (current) use of oral hypoglycemic drugs: Secondary | ICD-10-CM | POA: Diagnosis not present

## 2023-10-28 DIAGNOSIS — F329 Major depressive disorder, single episode, unspecified: Secondary | ICD-10-CM | POA: Diagnosis present

## 2023-10-28 DIAGNOSIS — I5032 Chronic diastolic (congestive) heart failure: Secondary | ICD-10-CM | POA: Diagnosis present

## 2023-10-28 DIAGNOSIS — E876 Hypokalemia: Secondary | ICD-10-CM | POA: Diagnosis present

## 2023-10-28 DIAGNOSIS — Z885 Allergy status to narcotic agent status: Secondary | ICD-10-CM | POA: Diagnosis not present

## 2023-10-28 DIAGNOSIS — M353 Polymyalgia rheumatica: Secondary | ICD-10-CM | POA: Diagnosis present

## 2023-10-28 DIAGNOSIS — N183 Chronic kidney disease, stage 3 unspecified: Secondary | ICD-10-CM | POA: Diagnosis present

## 2023-10-28 HISTORY — DX: Personal history of other diseases of the circulatory system: Z86.79

## 2023-10-28 LAB — ECHOCARDIOGRAM COMPLETE
Height: 64 in
S' Lateral: 1.6 cm
Weight: 2074.09 [oz_av]

## 2023-10-28 LAB — BASIC METABOLIC PANEL
Anion gap: 8 (ref 5–15)
BUN: 23 mg/dL (ref 8–23)
CO2: 21 mmol/L — ABNORMAL LOW (ref 22–32)
Calcium: 8.9 mg/dL (ref 8.9–10.3)
Chloride: 110 mmol/L (ref 98–111)
Creatinine, Ser: 1.17 mg/dL — ABNORMAL HIGH (ref 0.44–1.00)
GFR, Estimated: 45 mL/min — ABNORMAL LOW (ref >60)
Glucose, Bld: 103 mg/dL — ABNORMAL HIGH (ref 70–99)
Potassium: 4.1 mmol/L (ref 3.5–5.1)
Sodium: 139 mmol/L (ref 135–145)

## 2023-10-28 LAB — CBC
HCT: 36.2 % (ref 36.0–46.0)
Hemoglobin: 11.6 g/dL — ABNORMAL LOW (ref 12.0–15.0)
MCH: 28.9 pg (ref 26.0–34.0)
MCHC: 32 g/dL (ref 30.0–36.0)
MCV: 90.3 fL (ref 80.0–100.0)
Platelets: 311 10*3/uL (ref 150–400)
RBC: 4.01 MIL/uL (ref 3.87–5.11)
RDW: 15 % (ref 11.5–15.5)
WBC: 6.6 10*3/uL (ref 4.0–10.5)
nRBC: 0 % (ref 0.0–0.2)

## 2023-10-28 LAB — CORTISOL-AM, BLOOD
Cortisol - AM: 2.3 ug/dL — ABNORMAL LOW (ref 6.7–22.6)
Cortisol - AM: 6.6 ug/dL — ABNORMAL LOW (ref 6.7–22.6)

## 2023-10-28 LAB — GLUCOSE, CAPILLARY
Glucose-Capillary: 106 mg/dL — ABNORMAL HIGH (ref 70–99)
Glucose-Capillary: 119 mg/dL — ABNORMAL HIGH (ref 70–99)
Glucose-Capillary: 117 mg/dL — ABNORMAL HIGH (ref 70–99)
Glucose-Capillary: 159 mg/dL — ABNORMAL HIGH (ref 70–99)

## 2023-10-28 LAB — TSH: TSH: 2.696 u[IU]/mL (ref 0.350–4.500)

## 2023-10-28 MED ORDER — APIXABAN 2.5 MG PO TABS
2.5000 mg | ORAL_TABLET | Freq: Two times a day (BID) | ORAL | Status: DC
  Administered 2023-10-28 – 2023-10-29 (×3): 2.5 mg via ORAL
  Filled 2023-10-28 (×3): qty 1

## 2023-10-28 MED ORDER — HEPARIN (PORCINE) 25000 UT/250ML-% IV SOLN
800.0000 [IU]/h | INTRAVENOUS | Status: DC
  Administered 2023-10-28: 800 [IU]/h via INTRAVENOUS
  Filled 2023-10-28: qty 250

## 2023-10-28 MED ORDER — FAMOTIDINE 20 MG PO TABS
20.0000 mg | ORAL_TABLET | Freq: Every day | ORAL | Status: DC
  Administered 2023-10-28 – 2023-10-29 (×2): 20 mg via ORAL
  Filled 2023-10-28 (×2): qty 1

## 2023-10-28 MED ORDER — COSYNTROPIN 0.25 MG IJ SOLR
0.2500 mg | Freq: Once | INTRAMUSCULAR | Status: AC
  Administered 2023-10-29: 0.25 mg via INTRAVENOUS
  Filled 2023-10-28 (×2): qty 0.25

## 2023-10-28 NOTE — Discharge Instructions (Addendum)

## 2023-10-28 NOTE — Progress Notes (Signed)
Echocardiogram 2D Echocardiogram has been performed.  Warren Lacy Fayrene Towner RDCS 10/28/2023, 1:49 PM

## 2023-10-28 NOTE — Consult Note (Signed)
Cardiology Consultation:   [name] MRN: [id number]; DOB: [birth year]  Admit date: 10/27/2023 Date of Consult: 10/28/2023  Primary Care Provider: Default, Provider, MD [facility] Cardiologist: Chilton Si, MD  [facility] Electrophysiologist:  None    Patient Profile:   Christine [name] is a 89 y.o. female with a hx of hypertension, COPD, GERD, CAD, hyperlipidemia, hypothyroidism, giant cell arteritis, PMR  who is being seen today for the evaluation of syncope at the request of internal medicine.  History of Present Illness:   Christine [name] for the past few days has been feeling dizzy when walking around. She has had some mild DOE. Presented to urgent resident clinic, where was found to be orthostatic (123/54 lying down, 118/80 sitting, 95/55 standing), and thus was referred for admission.  Upon admission, ECG was obtained which showed new onset atrial flutter. Pt denies history of afib or flutter. No prior strokes. She sad she had an ECG in the spring for which she was told was normal.  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 LHC 08/23/11: dLM 40-50%, oRI 40%, oCFX 40%, oD1 70% (small and not amenable to PCI).  LM lesion did not appear to be flow limiting.  Medical rx was recommended;  b. Echo 08/23/11: mild LVH, EF 60-65%, grade 1 diast dysfxn, mild BAE, PASP 24;  c. 02/2012  Cath: LM 50d, LAD 50p, D1 70ost, RI 70p, RCA ok->Med Rx;  d. 01/2013 Cardiolite: EF 88, no ischemia/infarct.   Carotid stenosis    a. dopplers 10/12:  0-39% bilat ICA;  b. 10/2012 U/S: 0-39% bilat, f/u 1 yr (10/2013).   Chronic bronchitis     Chronotropic incompetence 05/01/2021   Depression    Diverticulosis of colon (without mention of hemorrhage)    GERD (gastroesophageal reflux disease)    Heart murmur, systolic    2-D echo in December 2011 showed a normal EF with grade 1 diastolic dysfunction, trivial pulmonary regurgitation and mildly elevated PA pressure at 37 mmHg probably secondary to her COPD.dynamic obstruction-mid cavity obliteration;  b. 02/2012 Echo: EF 60-65%, mild LVH, PASP .   History of stomach ulcers 1970's   Hyperlipidemia    Hypertension    Hypothyroidism    Migraines    a. Next atypical symptoms in the past. Patient was started on Neurontin for possible neuropathic origin of her pain.   Polymyalgia rheumatica (HCC)    Premature atrial contraction 05/01/2021   She remains asymptomatic.  No medication indicated.   Shortness of breath on exertion    "just related to angina >1 yr ago" (09/23/2013)   Sinus arrhythmia    Solitary pulmonary nodule 12/15/2006   CT Chest in January 2008  IMPRESSION:  1. Stable CT of the chest with mild biapical scarring and scattered nodularity, most likely postinflammatory in the absence of a history of malignancy.  2. No acute chest findings are demonstrated.  3. Unless the patient has a history of malignancy or risk factors for lung cancer, the chest findings do not necessarily require any specific followup. If follow  Type II diabetes mellitus (HCC)    a. On oral hypoglycemic agents.    Past Surgical History:  Procedure Laterality Date   CARDIAC CATHETERIZATION N/A 09/26/2015   Procedure: Right/Left Heart Cath and Coronary Angiography;  Surgeon: Laurey Morale, MD;  Location: [facility] INVASIVE CV LAB;  Service: Cardiovascular;  Laterality: N/A;   CATARACT EXTRACTION W/ INTRAOCULAR LENS  IMPLANT, BILATERAL Bilateral 1990's   LEFT HEART CATHETERIZATION WITH CORONARY ANGIOGRAM N/A 03/16/2012   Procedure: LEFT HEART CATHETERIZATION WITH CORONARY ANGIOGRAM;  Surgeon: Herby Abraham, MD;  Location: [facility] CATH LAB;  Service: Cardiovascular;  Laterality: N/A;   RIGHT/LEFT HEART CATH AND CORONARY ANGIOGRAPHY N/A 08/12/2017   Procedure: RIGHT/LEFT HEART CATH AND CORONARY ANGIOGRAPHY;  Surgeon: [place], Peter M, MD;  Location[facility] INVASIVE CV LAB;  Service: Cardiovascular;  Laterality: N/A;   VAGINAL HYSTERECTOMY  1976     Home Medications:  Prior to Admission medications   Medication Sig Start Date End Date Taking? Authorizing Provider  acetaminophen (TYLENOL) 500 MG tablet Take 1,000 mg by mouth every 6 (six) hours as needed for headache. For pain   Yes [provider]  amLODipine (NORVASC) 10 MG tablet TAKE 1 TABLET(10 MG) BY MOUTH DAILY Patient taking differently: Take 10 mg by mouth every evening. 02/20/23  Yes Inez Catalina, MD  Ascorbic Acid (VITAMIN C PO) Take 2 tablets by mouth daily.    Yes [provider]  atorvastatin (LIPITOR) 40 MG tablet TAKE 1/2 TABLET BY MOUTH EVERY EVENING Patient taking differently: Take 20 mg by mouth every evening. 11/21/22  Yes Inez Catalina, MD  cholecalciferol 25 MCG (1000 UT) tablet Take 1,000 Units by mouth daily.   Yes [provider]  clopidogrel (PLAVIX) 75 MG tablet TAKE 1 TABLET BY MOUTH EVERY EVENING Patient taking differently: Take 75 mg by mouth every evening. 05/19/23  Yes Inez Catalina, MD  famotidine (PEPCID) 20 MG tablet Take 1 tablet (20 mg total) by mouth 2 (two) times daily. Please schedule a yearly follow up for further refills.  Thank you 09/06/22  Yes Inez Catalina, MD  fluticasone (FLONASE) 50 MCG/ACT nasal spray SHAKE LIQUID AND USE 1 SPRAY IN EACH NOSTRIL EVERY DAY 04/18/23  Yes Inez Catalina, MD  fluticasone-salmeterol [facility] INHUB) 250-50 MCG/ACT AEPB Inhale 1 puff into the lungs in the morning and at bedtime. 07/30/23  Yes Inez Catalina, MD  glimepiride (AMARYL) 1 MG tablet TAKE 1 TABLET BY MOUTH EVERY DAY BEFORE BREAKFAST Patient taking differently: Take 1 mg by mouth daily  with breakfast. 11/21/22  Yes Inez Catalina, MD  imipramine (TOFRANIL) 50 MG tablet TAKE 1 TABLET(50 MG) BY MOUTH TWICE DAILY Patient taking differently: Take 50 mg by mouth 2 (two) times daily. 08/11/23  Yes Inez Catalina, MD  levothyroxine (SYNTHROID) 25 MCG tablet TAKE 1 TABLET BY MOUTH EVERY DAY Patient taking differently: Take 25 mcg by mouth daily before breakfast. 05/16/23  Yes Inez Catalina, MD  loratadine (CLARITIN) 10 MG tablet Take 10 mg by mouth every evening.   Yes [provider]  Multiple Vitamin (MULTIVITAMIN WITH MINERALS) TABS tablet Take 1 tablet by mouth daily. 11/29/16  Yes Althia Forts, MD  valsartan (DIOVAN) 320 MG tablet TAKE 1 TABLET(320 MG) BY MOUTH DAILY Patient taking differently: Take 320 mg by mouth every evening. 05/19/23  Yes Inez Catalina, MD  Blood Glucose Monitoring Suppl (ONETOUCH VERIO REFLECT) w/Device KIT Check blood sugar one time daily before breakfast 12/13/22  Inez Catalina, MD  glucose blood (ONETOUCH VERIO) test strip Check blood sugar one time daily before breakfast 04/01/23   Inez Catalina, MD  Lancet Devices [facility] PLUS LANCING) MISC Check blood sugar one time daily before breakfast 12/13/22   Inez Catalina, MD  Lancets [facility] DELICA PLUS [place]) MISC USE TO TEST ONCE DAILY 03/12/23   Inez Catalina, MD    Inpatient Medications: Scheduled Meds:  atorvastatin  20 mg Oral QPM   clopidogrel  75 mg Oral QPM   famotidine  20 mg Oral BID   imipramine  50 mg Oral BID   levothyroxine  25 mcg Oral QAC breakfast   mometasone-formoterol  2 puff Inhalation BID   Continuous Infusions:  heparin 800 Units/hr (10/28/23 0119)   PRN Meds: acetaminophen **OR** acetaminophen, senna-docusate  Allergies:    Allergies  Allergen Reactions   Aspirin Swelling and Other (See Comments)    Angioedema   Atarax [Hydroxyzine] Other (See Comments)    psychosis   Metoprolol Other (See Comments)    Chronotropic incompetence   Nsaids  Other (See Comments)    "interacts with heart medications"   Codeine Nausea And Vomiting    Can take if premedicated with nausea medications   Haldol [Haloperidol] Other (See Comments)    EPS - rigidity and dystonic reaction, very sensitive   Penicillins Diarrhea    "real bad" Has patient had a PCN reaction causing immediate rash, facial/tongue/throat swelling, SOB or lightheadedness with hypotension: Yes Has patient had a PCN reaction causing severe rash involving mucus membranes or skin necrosis: No Has patient had a PCN reaction that required hospitalization No Has patient had a PCN reaction occurring within the last 10 years: Yes If all of the above answers are "NO", then may proceed with Cephalosporin use.    Monosodium Glutamate Nausea Only and Other (See Comments)    Headaches    Social History:   Social History   Socioeconomic History   Marital status: Widowed    Spouse name: Not on file   Number of children: 7   Years of education: Not on file   Highest education level: Not on file  Occupational History   Occupation: retired  Tobacco Use   Smoking status: Never   Smokeless tobacco: Never  Vaping Use   Vaping status: Never Used  Substance and Sexual Activity   Alcohol use: No    Comment: 11/29/2016 "Last drink 1967"   Drug use: No   Sexual activity: Not Currently  Other Topics Concern   Not on file  Social History Narrative   Never smoked. Lives in [place] with her dtr.  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   Social Determinants of Health   Financial Resource Strain: Low Risk  (05/16/2023)   Overall Financial Resource Strain (CARDIA)    Difficulty of Paying Living Expenses: Not very hard  Food Insecurity: No Food Insecurity (10/27/2023)   Hunger Vital Sign    Worried About Running Out of Food in the Last Year: Never true    Ran Out of Food in the Last Year: Never true  Transportation Needs: No Transportation Needs  (10/27/2023)   PRAPARE - Administrator, Civil Service (Medical): No    Lack of Transportation (Non-Medical): No  Physical Activity: Inactive (05/16/2023)   Exercise Vital Sign    Days of Exercise per Week: 0 days    Minutes of Exercise per Session: 0  min  Stress: No Stress Concern Present (05/16/2023)   [employer] of Occupational Health - Occupational Stress Questionnaire    Feeling of Stress : Only a little  Social Connections: Moderately Isolated (05/16/2023)   Social Connection and Isolation Panel [NHANES]    Frequency of Communication with Friends and Family: More than three times a week    Frequency of Social Gatherings with Friends and Family: More than three times a week    Attends Religious Services: 1 to 4 times per year    Active Member of [employer] or Organizations: No    Attends Banker Meetings: Never    Marital Status: Widowed  Intimate Partner Violence: Not At Risk (10/27/2023)   Humiliation, Afraid, Rape, and Kick questionnaire    Fear of Current or Ex-Partner: No    Emotionally Abused: No    Physically Abused: No    Sexually Abused: No    Family History:   Family History  Problem Relation Age of Onset   Colon cancer Maternal Grandmother    Heart attack Maternal Grandmother    COPD Father    Other Mother        died of unknown causes in her 25's. Pt raised by grandmother.   Pulmonary Hypertension Daughter      ROS:  Review of Systems: [y] = yes, [ ]  = no      General: Weight gain [ ] ; Weight loss [ ] ; Anorexia [ ] ; Fatigue [ ] ; Fever [ ] ; Chills [ ] ; Weakness [ ]    Cardiac: Chest pain/pressure [ ] ; Resting SOB [ ] ; Exertional SOB [place] ]; Orthopnea [ ] ; Pedal Edema [ ] ; Palpitations [ y]; Syncope [ ] ; Presyncope [ y]; Paroxysmal nocturnal dyspnea [ ]    Pulmonary: Cough [ ] ; Wheezing [ ] ; Hemoptysis [ ] ; Sputum [ ] ; Snoring [ ]    GI: Vomiting [ ] ; Dysphagia [ ] ; Melena [ ] ; Hematochezia [ ] ; Heartburn [ ] ; Abdominal pain [ ] ; Constipation  [ ] ; Diarrhea [ ] ; BRBPR [ ]    GU: Hematuria [ ] ; Dysuria [ ] ; Nocturia [ ]  Vascular: Pain in legs with walking [ ] ; Pain in feet with lying flat [ ] ; Non-healing sores [ ] ; Stroke [ ] ; TIA [ ] ; Slurred speech [ ] ;   Neuro: Headaches [ ] ; Vertigo [ ] ; Seizures [ ] ; Paresthesias [ ] ;Blurred vision [ ] ; Diplopia [ ] ; Vision changes [ ]    Ortho/Skin: Arthritis [ ] ; Joint pain [ ] ; Muscle pain [ ] ; Joint swelling [ ] ; Back Pain [ ] ; Rash [ ]    Psych: Depression [ ] ; Anxiety [ ]    Heme: Bleeding problems [ ] ; Clotting disorders [ ] ; Anemia [ ]    Endocrine: Diabetes [ ] ; Thyroid dysfunction [ ]    Physical Exam/Data:   Vitals:   10/27/23 1611 10/27/23 2148  BP: 139/65 106/66  Pulse: (!) 102 95  Resp: 18 17  Temp: 98.1 F (36.7 C)   TempSrc: Oral   SpO2: 100% 100%  Weight: 58.8 kg   Height: 5\' 4"  (1.626 m)    No intake or output data in the 24 hours ending 10/28/23 0139    10/27/2023    4:11 PM 10/27/2023   10:37 AM 09/15/2023    2:28 PM  Last 3 Weights  Weight (lbs) 129 lb 10.1 oz 132 lb 132 lb 4.8 oz  Weight (kg) 58.8 kg 59.875 kg 60.011 kg     Body mass index is 22.25 kg/m.  General:  Well nourished, well developed, in no acute distress HEENT: normal Lymph: no adenopathy Neck: no JVD Endocrine:  No thryomegaly Vascular: No carotid bruits; FA pulses 2+ bilaterally without bruits  Cardiac:  normal S1, S2; irregularly irregular rhythm; no murmur  Lungs:  clear to auscultation bilaterally, no wheezing, rhonchi or rales  Abd: soft, nontender, no hepatomegaly  Ext: no edema Musculoskeletal:  No deformities, BUE and BLE strength normal and equal Skin: warm and dry  Neuro:  CNs 2-12 intact, no focal abnormalities noted Psych:  Normal affect   EKG:  The EKG was personally reviewed and demonstrates:  atrial flutter with variable response, HR 93 bpm Telemetry:  Telemetry was personally reviewed and demonstrates: NOT ON TELE   Laboratory Data:  High Sensitivity Troponin:  No  results for input(s): "TROPONINIHS" in the last 720 hours.   Chemistry Recent Labs  Lab 10/27/23 1806  NA 139  K 3.4*  CL 109  CO2 21*  GLUCOSE 91  BUN 27*  CREATININE 1.22*  CALCIUM 9.1  GFRNONAA 43*  ANIONGAP 9    No results for input(s): "PROT", "ALBUMIN", "AST", "ALT", "ALKPHOS", "BILITOT" in the last 168 hours. Hematology Recent Labs  Lab 10/27/23 1806  WBC 8.0  RBC 3.98  HGB 11.8*  HCT 35.4*  MCV 88.9  MCH 29.6  MCHC 33.3  RDW 14.7  PLT 315   BNPNo results for input(s): "BNP", "PROBNP" in the last 168 hours.  DDimer No results for input(s): "DDIMER" in the last 168 hours.  Radiology/Studies:  No results found.  Assessment and Plan:   New onset Atrial Flutter Need for Chronic Anticoagulation CAD (40% LM and 70% diagonal) Presented to outpatient clinic for new DOE and dizziness, found to be orthostatic, and thus referred for admission. ECG upon arrival shows new onset atrial flutter that is rate controlled. It is highly likely that her atrial flutter is contributing to her presenting symptoms of DOE and dizziness, but would not alone be a cause for orthostatic hypotension. Her primary risk factor for flutter is her age. Last echo in 2018. Last cath in 2018 reportedly showed 40% LM and 70% diagonal, which is why she has been on plavix, however this should not be continued if starting on DOAC. CHA?DS?-VASc Score of 7, high risk for CVA. -heparin gtt for now, likely transition to DOAC in upcoming days -Rate Control: none as rate controlled on vitals -Rhythm Control: if does not convert out in 24 hours, can consider TEE/DCCV while inpatient OR discharge with conservative management +/- DCCV after 30 days of anticoagulation -Start telemetry -TTE -update TSH -After starting DOAC, likely permanently discontinue clopidogrel -Continue home statin    For questions or updates, please contact [facility] Please consult [email or web address] for contact info under      Signed, Freddy Finner, MD  10/28/2023 1:39 AM

## 2023-10-28 NOTE — Plan of Care (Signed)

## 2023-10-28 NOTE — Evaluation (Signed)
Physical Therapy Evaluation Patient Details Name: Christine [name] MRN: [id number] DOB: 1933-05-23 Today's Date: 10/28/2023  History of Present Illness  89 y.o. female presents with worsening dizziness over the last few days, dx with orthostatic hypotension and new onset of atrial flutter. PMH significant for anemia, arthritis, CHF, CKD, CAD, GERD, HTN, Migraines, DMII.  Clinical Impression  Pt admitted with above diagnosis. At baseline, pt is independent.  Today, pt was able to ambulate 81' with close supervision.  She reports mild lightheadedness but improved.  Had drop in BP but < 20 mmHg and improved from yesterday (see below).  Educated on safety and techniques to manage orthostatic hypotension (see comments).  Expect pt to progress well and no therapy needs at d/c.  Has family support and necessary DME.  Pt currently with functional limitations due to the deficits listed below (see PT Problem List). Pt will benefit from acute skilled PT to increase their independence and safety with mobility to allow discharge.       BP as follows: Supine: 126/75, MAP 92, HR 76 Sitting: 130/72, MAP 86 , HR 96 Standing: 126/66, MAP 85, HR 108 Standing post walk 118/66, MAP 81, HR 130    If plan is discharge home, recommend the following: A little help with walking and/or transfers;A little help with bathing/dressing/bathroom;Assistance with cooking/housework;Help with stairs or ramp for entrance   Can travel by private vehicle        Equipment Recommendations None recommended by PT  Recommendations for Other Services       Functional Status Assessment Patient has had a recent decline in their functional status and demonstrates the ability to make significant improvements in function in a reasonable and predictable amount of time.     Precautions / Restrictions Precautions Precautions: Fall Precaution Comments: orthostatic hypotension Restrictions Weight Bearing Restrictions: No       Mobility  Bed Mobility Overal bed mobility: Independent                  Transfers Overall transfer level: Needs assistance Equipment used: None Transfers: Sit to/from Stand Sit to Stand: Supervision           General transfer comment: close supervision for safety due to recent dizziness    Ambulation/Gait Ambulation/Gait assistance: Contact guard assist, Supervision Gait Distance (Feet): 80 Feet Assistive device: None Gait Pattern/deviations: Step-through pattern Gait velocity: decreased but functional     General Gait Details: Slowed pattern and fatigued easier than baseline but overall steady progressed from CGA to close supervision  Stairs            Wheelchair Mobility     Tilt Bed    Modified Rankin (Stroke Patients Only)       Balance Overall balance assessment: Needs assistance Sitting-balance support: No upper extremity supported Sitting balance-Leahy Scale: Good     Standing balance support: No upper extremity supported Standing balance-Leahy Scale: Good                               Pertinent Vitals/Pain Pain Assessment Pain Assessment: No/denies pain    Home Living Family/patient expects to be discharged to:: Private residence Living Arrangements: Children Available Help at Discharge: Family;Available PRN/intermittently Type of Home: House Home Access: Stairs to enter Entrance Stairs-Rails: None Entrance Stairs-Number of Steps: 4 Alternate Level Stairs-Number of Steps: 16 Home Layout: Two level;Bed/bath upstairs Home Equipment: Pharmacist, hospital (2 wheels);Wheelchair - Physiological scientist (4 wheels);BSC/3in1 Additional  Comments: Pt lives with daughter who works during the day. Her other daughter can assist at other times as well.    Prior Function Prior Level of Function : Independent/Modified Independent             Mobility Comments: no AD, no falls; could ambulate in community ADLs Comments:  help with cooking/cleaning; likes to stay busy during day     Extremity/Trunk Assessment   Upper Extremity Assessment Upper Extremity Assessment: Defer to OT evaluation (overall WFL) RUE Deficits / Details: mild stiffness in fingers/MCPs, great strength, does not affect day to day function LUE Deficits / Details: mild stiffness in fingers/MCPs, great strength, does not affect day to day function    Lower Extremity Assessment Lower Extremity Assessment: LLE deficits/detail;RLE deficits/detail RLE Deficits / Details: ROM WFL; MMT 5/5 LLE Deficits / Details: ROM WFL; MMT 5/5    Cervical / Trunk Assessment Cervical / Trunk Assessment: Normal  Communication   Communication Communication: No apparent difficulties  Cognition Arousal: Alert Behavior During Therapy: WFL for tasks assessed/performed Overall Cognitive Status: Within Functional Limits for tasks assessed                                 General Comments: multiple family members present        General Comments General comments (skin integrity, edema, etc.):  Educated pt on safety with orthostatic hypotensions including slow transitions with AROM exercises in each position prior to progressing, monitoring symptoms with return to sit or supine if lightheaded, compression stockings as needed.    Exercises     Assessment/Plan    PT Assessment Patient needs continued PT services  PT Problem List Cardiopulmonary status limiting activity;Decreased range of motion;Decreased activity tolerance;Decreased balance;Decreased mobility;Decreased knowledge of precautions;Decreased knowledge of use of DME       PT Treatment Interventions DME instruction;Therapeutic exercise;Gait training;Balance training;Stair training;Functional mobility training;Therapeutic activities;Patient/family education;Modalities;Neuromuscular re-education    PT Goals (Current goals can be found in the Care Plan section)  Acute Rehab PT  Goals Patient Stated Goal: return home PT Goal Formulation: With patient/family Time For Goal Achievement: 11/11/23 Potential to Achieve Goals: Good    Frequency Min 1X/week     Co-evaluation               AM-PAC PT "6 Clicks" Mobility  Outcome Measure Help needed turning from your back to your side while in a flat bed without using bedrails?: None Help needed moving from lying on your back to sitting on the side of a flat bed without using bedrails?: None Help needed moving to and from a bed to a chair (including a wheelchair)?: A Little Help needed standing up from a chair using your arms (e.g., wheelchair or bedside chair)?: A Little Help needed to walk in hospital room?: A Little Help needed climbing 3-5 steps with a railing? : A Little 6 Click Score: 20    End of Session Equipment Utilized During Treatment: Gait belt Activity Tolerance: Patient tolerated treatment well Patient left: in bed;with call bell/phone within reach;with family/visitor present Nurse Communication: Mobility status PT Visit Diagnosis: Other abnormalities of gait and mobility (R26.89)    Time: 8416-6063 PT Time Calculation (min) (ACUTE ONLY): 24 min   Charges:   PT Evaluation $PT Eval Low Complexity: 1 Low PT Treatments $Therapeutic Activity: 8-22 mins PT General Charges $$ ACUTE PT VISIT: 1 Visit         Macklyn Glandon,  PT Acute Rehab Services [facility] [phone]   Rayetta Humphrey 10/28/2023, 12:12 PM

## 2023-10-28 NOTE — Evaluation (Signed)
Occupational Therapy Evaluation Patient Details Name: Christine [name] MRN: [id number] DOB: [birth year] Today's Date: 10/28/2023   History of Present Illness 89 y.o. female presents with worsening dizziness over the last few days, dx with orthostatic hypotension and new onset of atrial flutter. PMH significant for anemia, arthritis, CHF, CKD, CAD, GERD, HTN, Migraines, DMII.   Clinical Impression   Pt feeling better overall, states she has been up to bathroom with no dizziness today. Pt lives with daughter, independent at baseline for ADLs, no AD with ambulation, daughter assists with cooking/cleaning. Pt currently at baseline, had some dizziness today with ambulating down hall. BP recorded after activities 166/85 sitting, 151/82 lying, and 123/73 standing. Pt feeling good overall. Pt instructed on safety awareness with using AD for ambulation at home if having frequent dizziness, performing lying to sitting, sitting to standing slowly to ensure safety. Pt and family state they have all DME and support at home needed to remain safe/independent. No acute OT or follow up needs.       If plan is discharge home, recommend the following: A little help with walking and/or transfers;Help with stairs or ramp for entrance;Assist for transportation    Functional Status Assessment  Patient has had a recent decline in their functional status and demonstrates the ability to make significant improvements in function in a reasonable and predictable amount of time.  Equipment Recommendations  None recommended by OT    Recommendations for Other Services       Precautions / Restrictions Precautions Precautions: Fall Restrictions Weight Bearing Restrictions: No      Mobility Bed Mobility Overal bed mobility: Independent                  Transfers Overall transfer level: Needs assistance Equipment used: None Transfers: Sit to/from Stand, Bed to chair/wheelchair/BSC Sit to Stand: Supervision            General transfer comment: supervision for safety due to recent dizziness      Balance Overall balance assessment: Mild deficits observed, not formally tested                                         ADL either performed or assessed with clinical judgement   ADL Overall ADL's : At baseline;Independent                                       General ADL Comments: over than slight dizziness, Pt at baseline.     Vision Baseline Vision/History: 0 No visual deficits Ability to See in Adequate Light: 0 Adequate Patient Visual Report: No change from baseline       Perception         Praxis         Pertinent Vitals/Pain Pain Assessment Pain Assessment: No/denies pain     Extremity/Trunk Assessment Upper Extremity Assessment Upper Extremity Assessment: RUE deficits/detail;LUE deficits/detail RUE Deficits / Details: mild stiffness in fingers/MCPs, great strength, does not affect day to day function LUE Deficits / Details: mild stiffness in fingers/MCPs, great strength, does not affect day to day function           Communication Communication Communication: No apparent difficulties   Cognition Arousal: Alert Behavior During Therapy: WFL for tasks assessed/performed Overall Cognitive Status: Within Functional Limits for tasks assessed  General Comments  BP 151/82 lying, 166/85 sitting, 123/73 standing.    Exercises     Shoulder Instructions      Home Living Family/patient expects to be discharged to:: Private residence Living Arrangements: Children Available Help at Discharge: Family;Available PRN/intermittently Type of Home: House Home Access: Stairs to enter [employer] of Steps: 4 Entrance Stairs-Rails: None Home Layout: Two level;Bed/bath upstairs Alternate Level Stairs-Number of Steps: 16 Alternate Level Stairs-Rails: Can reach both Bathroom  Shower/Tub: Walk-in shower         Home Equipment: Pharmacist, hospital (2 wheels);Wheelchair - Physiological scientist (4 wheels);BSC/3in1   Additional Comments: Pt lives with daughter who works during the day. Her other daughter can assist at other times as well.      Prior Functioning/Environment Prior Level of Function : Independent/Modified Independent             Mobility Comments: no AD, no falls ADLs Comments: help with cooking/cleaning        OT Problem List: Impaired balance (sitting and/or standing);Decreased activity tolerance      OT Treatment/Interventions:      OT Goals(Current goals can be found in the care plan section) Acute Rehab OT Goals Patient Stated Goal: to return home OT Goal Formulation: With patient/family Time For Goal Achievement: 11/11/23 Potential to Achieve Goals: Good  OT Frequency:      Co-evaluation              AM-PAC OT "6 Clicks" Daily Activity     Outcome Measure Help from another person eating meals?: None Help from another person taking care of personal grooming?: None Help from another person toileting, which includes using toliet, bedpan, or urinal?: None Help from another person bathing (including washing, rinsing, drying)?: None Help from another person to put on and taking off regular upper body clothing?: None Help from another person to put on and taking off regular lower body clothing?: None 6 Click Score: 24   End of Session Equipment Utilized During Treatment: Gait belt Nurse Communication: Mobility status  Activity Tolerance: Patient tolerated treatment well Patient left: in bed;with call bell/phone within reach;with family/visitor present  OT Visit Diagnosis: Dizziness and giddiness (R42)                Time: 1018-1050 OT Time Calculation (min): 32 min Charges:  OT General Charges $OT Visit: 1 Visit OT Evaluation $OT Eval Low Complexity: 1 Low OT Treatments $Self Care/Home Management : 8-22  mins  [place], OTR/L   Alexis Goodell 10/28/2023, 10:55 AM

## 2023-10-28 NOTE — Telephone Encounter (Signed)
Patient Product/process development scientist completed.    The patient is insured through [employer]. Patient has Medicare and is not eligible for a copay card, but may be able to apply for patient assistance, if available.    Ran test claim for Eliquis 2.5 mg and the current 30 day co-pay is $4.60.   This test claim was processed through [facility]- copay amounts may vary at other pharmacies due to pharmacy/plan contracts, or as the patient moves through the different stages of their insurance plan.     Roland Earl, CPHT Pharmacy Technician III Certified Patient Advocate [facility] Patient Advocate Team Direct Number: [phone]  Fax: [phone]

## 2023-10-28 NOTE — Progress Notes (Signed)
Patient seen and examined.  Agree with plan per Dr. Hulan Saas.  Christine [name] is an 89 year old female with a history of COPD, CAD, hypothyroidism, giant cell arteritis, PMR who we are consulted for evaluation of syncope.  She was found to have orthostatic hypotension on presentation.  ECG showed atrial flutter, which is a new diagnosis.  Agree with transitioning from heparin gtt to Eliquis 2.5 mg twice daily, reduced dose appropriate given age/weight.  Can stop Plavix with starting Eliquis, as she was taking for medical management of her CAD.  Will follow-up echocardiogram.  If rates remain controlled and no systolic dysfunction on echo, will likely plan outpatient DCCV once completes 3 weeks of anticoagulation.  If having difficulty with rate control or EF is down on echo, would plan for inpatient TEE/DCCV.  [name], MD

## 2023-10-28 NOTE — Progress Notes (Signed)
HD#0 SUBJECTIVE:  Patient Summary: Christine [name] is a 89 y.o. female with past medical history of hypertension, COPD, GERD, CAD, hyperlipidemia, hypothyroidism, giant cell arteritis, PMR was seen at the [facility] today with concerns of orthostatic vitals admitted directly for further work up, was found to have new onset A flutter.   Overnight Events: EKG showed new onset a flutter.  Cardiology consulted.  Interim History: Patient evaluated at bedside this morning, she denies any dizziness or lightheadedness.  She denies any headaches, chest pain, shortness of breath.  She denies any abdominal pain nausea or vomiting.  She denies any pain anywhere else.  All questions were answered.  Family at bedside.  OBJECTIVE:  Vital Signs: Vitals:   10/27/23 2148 10/28/23 0634 10/28/23 0813 10/28/23 0936  BP: 106/66 125/84 128/72   Pulse: 95 86 92 100  Resp: 17 17 17 18   Temp:  97.9 F (36.6 C) 97.8 F (36.6 C)   TempSrc:  Oral Oral   SpO2: 100% 100% 100% 98%  Weight:      Height:       Supplemental O2: Room Air SpO2: 98 % O2 Flow Rate (L/min): 0 L/min  Filed Weights   10/27/23 1611  Weight: 58.8 kg     Intake/Output Summary (Last 24 hours) at 10/28/2023 1454 Last data filed at 10/28/2023 0600 Gross per 24 hour  Intake 37.32 ml  Output --  Net 37.32 ml   Net IO Since Admission: 37.32 mL [10/28/23 1454]  Physical Exam: Physical Exam  Constitutional: Well-appearing, laying in bed comfortably, no acute distress HENT: normocephalic atraumatic, mucous membranes moist Eyes: conjunctiva non-erythematous Cardiovascular: regular rate, irregular rhythm. no lower extremity edema bilaterally, 2+ DP pulses intact bilaterally. Pulmonary/Chest: normal work of breathing on room air, lungs clear to auscultation bilaterally Abdominal: soft, non-tender, non-distended, bowel sounds present MSK: normal bulk and tone Neurological: alert & oriented x 3, 5/5 strength in bilateral upper and lower  extremities Skin: warm and dry Psych: Normal mood and affect  Patient Lines/Drains/Airways Status     Active Line/Drains/Airways     Name Placement date Placement time Site Days   Peripheral IV 10/27/23 20 G Anterior;Left Forearm 10/27/23  1637  Forearm  1             ASSESSMENT/PLAN:  Assessment: Principal Problem:   Orthostatic hypotension   Plan: # New onset a flutter # Orthostatic hypotension Presented with 1 week worsening of dizziness and lightheadedness, worsened with positional changes and weakness. No syncope or fall. No room spinning sensation. No headaches, nausea, vomiting, diarrhea. No changes in medications. Reports adequate po intake. No recent sickness. Orthostatic vitals in clinic noted as 123/54 lying down, 118/80 sitting, 95/55 standing at 0 min. Labs showed K 3.4, Cr 1.22 [1.10], normal WBC, hgb 11.8 [11.9]. EKG showed new onset aflutter, with rate controlled. Cardiology consulted and appreciate recommendations. She was started on heparin gtt overnight, transitioned to Eliquis 2.5 mg BID (age appropriate dose). TTE today to evaluate for systolic dysfunction, if no concern, would likely plan outpatient DCCV once patient completes 3 weeks of anticoagulation.  Orthostatic vitals checked today, normal.  Plan: -F/u Echo -PT/OT: No recommendation -Continue Eliquis 2.5 mg twice daily -Cardiac monitoring  #Hx of GCA #Hx of PMR She has a history of GCA and PMR for which she takes prednisone as needed for flare up. She took prednisone 20 mg last night, did take couple of doses in October, otherwise, is not on chronic prednisone therapy since last  year. She does endorse chronic lightheadedness for about a year, though worsening this past couple of days. There is some concern for secondary adrenal insufficiency due to chronic steroid use, a.m. cortisol checked 6.6, we will do cosyntropin stimulation test. -Follow-up on cosyntropin stimulation test.  #Chronic  conditions CAD: Stopped Plavix, transition to Eliquis.  COPD: Continue home medication Dulera Hyperlipidemia: Continue home medication Lipitor 20 mg daily Hypothyroidism: Continue home medication Synthroid 25 mcg daily GERD: Continue home medication Pepcid 20 mg twice daily MDD: Per chart review, stopping TCA resulted psychosis.  Will continue TCA  Best Practice: Diet: Cardiac diet IVF: Fluids: None, Rate: None VTE: apixaban (ELIQUIS) tablet 2.5 mg Start: 10/28/23 0930 Code: Full AB: None Therapy Recs: None, DME: none Family Contact: at bedside. DISPO: Anticipated discharge  pending  to Home pending  ECHO .  Signature: Jeral Pinch, D.O.  Internal Medicine Resident, PGY-1 [facility]  Pager: [phone] 2:54 PM, 10/28/2023   Please contact the on call pager after 5 pm and on weekends at [phone].

## 2023-10-28 NOTE — Progress Notes (Addendum)
PHARMACY - ANTICOAGULATION CONSULT NOTE  Pharmacy Consult for heparin transition to apixaban Indication: atrial fibrillation  Allergies  Allergen Reactions   Aspirin Swelling and Other (See Comments)    Angioedema   Atarax [Hydroxyzine] Other (See Comments)    psychosis   Metoprolol Other (See Comments)    Chronotropic incompetence   Nsaids Other (See Comments)    "interacts with heart medications"   Codeine Nausea And Vomiting    Can take if premedicated with nausea medications   Haldol [Haloperidol] Other (See Comments)    EPS - rigidity and dystonic reaction, very sensitive   Penicillins Diarrhea    "real bad" Has patient had a PCN reaction causing immediate rash, facial/tongue/throat swelling, SOB or lightheadedness with hypotension: Yes Has patient had a PCN reaction causing severe rash involving mucus membranes or skin necrosis: No Has patient had a PCN reaction that required hospitalization No Has patient had a PCN reaction occurring within the last 10 years: Yes If all of the above answers are "NO", then may proceed with Cephalosporin use.    Monosodium Glutamate Nausea Only and Other (See Comments)    Headaches    Patient Measurements: Height: 5\' 4"  (162.6 cm) Weight: 58.8 kg (129 lb 10.1 oz) IBW/kg (Calculated) : 54.7  Vital Signs: Temp: 97.8 F (36.6 C) (12/10 0813) Temp Source: Oral (12/10 0813) BP: 128/72 (12/10 0813) Pulse Rate: 92 (12/10 0813)  Labs: Recent Labs    10/27/23 1806 10/28/23 0527  HGB 11.8* 11.6*  HCT 35.4* 36.2  PLT 315 311  CREATININE 1.22* 1.17*    Estimated Creatinine Clearance: 28.7 mL/min (A) (by C-G formula based on SCr of 1.17 mg/dL (H)).   Medical History: Past Medical History:  Diagnosis Date   Anemia    Anginal pain (HCC)    Arthritis    "back, arms, hips; hands" (11/30/2015)   Benign hypertensive heart and kidney disease with diastolic CHF, NYHA class II and CKD stage III (HCC)    Blood transfusion 1972   "after  daughter born, attempted to give me blood; couldn't give it cause my blood was cold" (09/23/2013)   CAD (coronary artery disease)    a. LHC 08/23/11: dLM 40-50%, oRI 40%, oCFX 40%, oD1 70% (small and not amenable to PCI).  LM lesion did not appear to be flow limiting.  Medical rx was recommended;  b. Echo 08/23/11: mild LVH, EF 60-65%, grade 1 diast dysfxn, mild BAE, PASP 24;  c. 02/2012  Cath: LM 50d, LAD 50p, D1 70ost, RI 70p, RCA ok->Med Rx;  d. 01/2013 Cardiolite: EF 88, no ischemia/infarct.   Carotid stenosis    a. dopplers 10/12:  0-39% bilat ICA;  b. 10/2012 U/S: 0-39% bilat, f/u 1 yr (10/2013).   Chronic bronchitis    Chronotropic incompetence 05/01/2021   Depression    Diverticulosis of colon (without mention of hemorrhage)    GERD (gastroesophageal reflux disease)    Heart murmur, systolic    2-D echo in December 2011 showed a normal EF with grade 1 diastolic dysfunction, trivial pulmonary regurgitation and mildly elevated PA pressure at 37 mmHg probably secondary to her COPD.dynamic obstruction-mid cavity obliteration;  b. 02/2012 Echo: EF 60-65%, mild LVH, PASP .   History of stomach ulcers 1970's   Hyperlipidemia    Hypertension    Hypothyroidism    Migraines    a. Next atypical symptoms in the past. Patient was started on Neurontin for possible neuropathic origin of her pain.   Polymyalgia rheumatica (HCC)  Premature atrial contraction 05/01/2021   She remains asymptomatic.  No medication indicated.   Shortness of breath on exertion    "just related to angina >1 yr ago" (09/23/2013)   Sinus arrhythmia    Solitary pulmonary nodule 12/15/2006   CT Chest in January 2008  IMPRESSION:  1. Stable CT of the chest with mild biapical scarring and scattered nodularity, most likely postinflammatory in the absence of a history of malignancy.  2. No acute chest findings are demonstrated.  3. Unless the patient has a history of malignancy or risk factors for lung cancer, the chest findings do  not necessarily require any specific followup. If follow    Type II diabetes mellitus (HCC)    a. On oral hypoglycemic agents.    Medications:  Medications Prior to Admission  Medication Sig Dispense Refill Last Dose   acetaminophen (TYLENOL) 500 MG tablet Take 1,000 mg by mouth every 6 (six) hours as needed for headache. For pain   Past Week   amLODipine (NORVASC) 10 MG tablet TAKE 1 TABLET(10 MG) BY MOUTH DAILY (Patient taking differently: Take 10 mg by mouth every evening.) 90 tablet 3 10/26/2023   Ascorbic Acid (VITAMIN C PO) Take 2 tablets by mouth daily.    10/27/2023   atorvastatin (LIPITOR) 40 MG tablet TAKE 1/2 TABLET BY MOUTH EVERY EVENING (Patient taking differently: Take 20 mg by mouth every evening.) 45 tablet 3 10/26/2023   cholecalciferol 25 MCG (1000 UT) tablet Take 1,000 Units by mouth daily.   10/26/2023   clopidogrel (PLAVIX) 75 MG tablet TAKE 1 TABLET BY MOUTH EVERY EVENING (Patient taking differently: Take 75 mg by mouth every evening.) 90 tablet 3 10/26/2023   famotidine (PEPCID) 20 MG tablet Take 1 tablet (20 mg total) by mouth 2 (two) times daily. Please schedule a yearly follow up for further refills.  Thank you 180 tablet 2 10/27/2023   fluticasone (FLONASE) 50 MCG/ACT nasal spray SHAKE LIQUID AND USE 1 SPRAY IN EACH NOSTRIL EVERY DAY 16 g 2 Past Month   fluticasone-salmeterol (WIXELA INHUB) 250-50 MCG/ACT AEPB Inhale 1 puff into the lungs in the morning and at bedtime. 60 each 3 10/27/2023   glimepiride (AMARYL) 1 MG tablet TAKE 1 TABLET BY MOUTH EVERY DAY BEFORE BREAKFAST (Patient taking differently: Take 1 mg by mouth daily with breakfast.) 90 tablet 3 10/27/2023   imipramine (TOFRANIL) 50 MG tablet TAKE 1 TABLET(50 MG) BY MOUTH TWICE DAILY (Patient taking differently: Take 50 mg by mouth 2 (two) times daily.) 180 tablet 3 10/27/2023   levothyroxine (SYNTHROID) 25 MCG tablet TAKE 1 TABLET BY MOUTH EVERY DAY (Patient taking differently: Take 25 mcg by mouth daily before  breakfast.) 90 tablet 1 10/27/2023   loratadine (CLARITIN) 10 MG tablet Take 10 mg by mouth every evening.   10/26/2023   Multiple Vitamin (MULTIVITAMIN WITH MINERALS) TABS tablet Take 1 tablet by mouth daily. 30 tablet 2 10/27/2023   valsartan (DIOVAN) 320 MG tablet TAKE 1 TABLET(320 MG) BY MOUTH DAILY (Patient taking differently: Take 320 mg by mouth every evening.) 90 tablet 3 10/26/2023   Blood Glucose Monitoring Suppl (ONETOUCH VERIO REFLECT) w/Device KIT Check blood sugar one time daily before breakfast 1 kit 1    glucose blood (ONETOUCH VERIO) test strip Check blood sugar one time daily before breakfast 100 each 5    Lancet Devices (ONETOUCH DELICA PLUS LANCING) MISC Check blood sugar one time daily before breakfast 100 each 5    Lancets (ONETOUCH DELICA PLUS LANCET33G) MISC  USE TO TEST ONCE DAILY 100 each 11    Scheduled:   apixaban  2.5 mg Oral BID   atorvastatin  20 mg Oral QPM   clopidogrel  75 mg Oral QPM   famotidine  20 mg Oral Daily   imipramine  50 mg Oral BID   levothyroxine  25 mcg Oral QAC breakfast   mometasone-formoterol  2 puff Inhalation BID   Assessment: 89yo female admitted from PCP OV for further w/u of orthostatic hypotension, went into Afib overnight, no known h/o Afib >> to begin heparin.  Goal of Therapy:  Monitor platelets by anticoagulation protocol: Yes   Plan:  DC heparin infusion DC Plavix Start apixaban 2.5 mg po bid Monitor for signs of bleeding Pharmacy will sign off consult but continue to monitor making recs prn Thank you  Greta Doom BS, PharmD, BCPS Clinical Pharmacist 10/28/2023 8:37 AM  Contact: [phone] after 3 PM  "Be curious, not judgmental..." -Debbora Dus

## 2023-10-28 NOTE — Progress Notes (Signed)
PHARMACY - ANTICOAGULATION CONSULT NOTE  Pharmacy Consult for heparin Indication: atrial fibrillation  Allergies  Allergen Reactions   Aspirin Swelling and Other (See Comments)    Angioedema   Atarax [Hydroxyzine] Other (See Comments)    psychosis   Metoprolol Other (See Comments)    Chronotropic incompetence   Nsaids Other (See Comments)    "interacts with heart medications"   Codeine Nausea And Vomiting    Can take if premedicated with nausea medications   Haldol [Haloperidol] Other (See Comments)    EPS - rigidity and dystonic reaction, very sensitive   Penicillins Diarrhea    "real bad" Has patient had a PCN reaction causing immediate rash, facial/tongue/throat swelling, SOB or lightheadedness with hypotension: Yes Has patient had a PCN reaction causing severe rash involving mucus membranes or skin necrosis: No Has patient had a PCN reaction that required hospitalization No Has patient had a PCN reaction occurring within the last 10 years: Yes If all of the above answers are "NO", then may proceed with Cephalosporin use.    Monosodium Glutamate Nausea Only and Other (See Comments)    Headaches    Patient Measurements: Height: 5\' 4"  (162.6 cm) Weight: 58.8 kg (129 lb 10.1 oz) IBW/kg (Calculated) : 54.7  Vital Signs: Temp: 98.1 F (36.7 C) (12/09 1611) Temp Source: Oral (12/09 1611) BP: 106/66 (12/09 2148) Pulse Rate: 95 (12/09 2148)  Labs: Recent Labs    10/27/23 1806  HGB 11.8*  HCT 35.4*  PLT 315  CREATININE 1.22*    Estimated Creatinine Clearance: 27.5 mL/min (A) (by C-G formula based on SCr of 1.22 mg/dL (H)).   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 LHC 08/23/11: dLM 40-50%, oRI 40%, oCFX 40%, oD1 70% (small and not amenable to PCI).  LM lesion did not appear to be flow limiting.  Medical rx was recommended;  b. Echo 08/23/11: mild LVH, EF 60-65%, grade 1 diast dysfxn, mild BAE, PASP 24;  c. 02/2012  Cath: LM 50d, LAD 50p, D1 70ost, RI 70p, RCA ok->Med Rx;  d. 01/2013 Cardiolite: EF 88, no ischemia/infarct.   Carotid stenosis    a. dopplers 10/12:  0-39% bilat ICA;  b. 10/2012 U/S: 0-39% bilat, f/u 1 yr (10/2013).   Chronic bronchitis    Chronotropic incompetence 05/01/2021   Depression    Diverticulosis of colon (without mention of hemorrhage)    GERD (gastroesophageal reflux disease)    Heart murmur, systolic    2-D echo in December 2011 showed a normal EF with grade 1 diastolic dysfunction, trivial pulmonary regurgitation and mildly elevated PA pressure at 37 mmHg probably secondary to her COPD.dynamic obstruction-mid cavity obliteration;  b. 02/2012 Echo: EF 60-65%, mild LVH, PASP .   History of stomach ulcers 1970's   Hyperlipidemia    Hypertension    Hypothyroidism    Migraines    a. Next atypical symptoms in the past. Patient was started on Neurontin for possible neuropathic origin of her pain.   Polymyalgia rheumatica (HCC)    Premature atrial contraction 05/01/2021   She  remains asymptomatic.  No medication indicated.   Shortness of breath on exertion    "just related to angina >1 yr ago" (09/23/2013)   Sinus arrhythmia    Solitary pulmonary nodule 12/15/2006   CT Chest in January 2008  IMPRESSION:  1. Stable CT of the chest with mild biapical scarring and scattered nodularity, most likely postinflammatory in the absence of a history of malignancy.  2. No acute chest findings are demonstrated.  3. Unless the patient has a history of malignancy or risk factors for lung cancer, the chest findings do not necessarily require any specific followup. If follow     Type II diabetes mellitus (HCC)    a. On oral hypoglycemic agents.    Medications:  Medications Prior to Admission  Medication Sig Dispense Refill Last Dose   acetaminophen (TYLENOL) 500 MG tablet Take 1,000 mg by mouth every 6 (six) hours as needed for headache. For pain   Past Week   amLODipine (NORVASC) 10 MG tablet TAKE 1 TABLET(10 MG) BY MOUTH DAILY (Patient taking differently: Take 10 mg by mouth every evening.) 90 tablet 3 10/26/2023   Ascorbic Acid (VITAMIN C PO) Take 2 tablets by mouth daily.    10/27/2023   atorvastatin (LIPITOR) 40 MG tablet TAKE 1/2 TABLET BY MOUTH EVERY EVENING (Patient taking differently: Take 20 mg by mouth every evening.) 45 tablet 3 10/26/2023   cholecalciferol 25 MCG (1000 UT) tablet Take 1,000 Units by mouth daily.   10/26/2023   clopidogrel (PLAVIX) 75 MG tablet TAKE 1 TABLET BY MOUTH EVERY EVENING (Patient taking differently: Take 75 mg by mouth every evening.) 90 tablet 3 10/26/2023   famotidine (PEPCID) 20 MG tablet Take 1 tablet (20 mg total) by mouth 2 (two) times daily. Please schedule a yearly follow up for further refills.  Thank you 180 tablet 2 10/27/2023   fluticasone (FLONASE) 50 MCG/ACT nasal spray SHAKE LIQUID AND USE 1 SPRAY IN EACH NOSTRIL EVERY DAY 16 g 2 Past Month   fluticasone-salmeterol (WIXELA INHUB) 250-50 MCG/ACT AEPB Inhale 1 puff into the lungs in the morning and at bedtime. 60 each 3 10/27/2023   glimepiride (AMARYL) 1 MG tablet TAKE 1 TABLET BY MOUTH EVERY DAY BEFORE BREAKFAST (Patient taking differently: Take 1 mg by mouth daily with breakfast.) 90 tablet 3 10/27/2023   imipramine (TOFRANIL) 50 MG tablet TAKE 1 TABLET(50 MG) BY MOUTH TWICE DAILY (Patient taking differently: Take 50 mg by mouth 2 (two) times daily.) 180 tablet 3 10/27/2023   levothyroxine (SYNTHROID) 25 MCG tablet TAKE 1 TABLET BY MOUTH EVERY DAY (Patient taking differently: Take 25 mcg by mouth daily before breakfast.) 90 tablet 1 10/27/2023   loratadine (CLARITIN) 10 MG  tablet Take 10 mg by mouth every evening.   10/26/2023   Multiple Vitamin (MULTIVITAMIN WITH MINERALS) TABS tablet Take 1 tablet by mouth daily. 30 tablet 2 10/27/2023   valsartan (DIOVAN) 320 MG tablet TAKE 1 TABLET(320 MG) BY MOUTH DAILY (Patient taking differently: Take 320 mg by mouth every evening.) 90 tablet 3 10/26/2023   Blood Glucose Monitoring Suppl (ONETOUCH VERIO REFLECT) w/Device KIT Check blood sugar one time daily before breakfast 1 kit 1    glucose blood (ONETOUCH VERIO) test strip Check blood sugar one time daily before breakfast 100 each 5    Lancet Devices (ONETOUCH DELICA PLUS LANCING) MISC Check blood sugar one time daily before breakfast 100 each 5    Lancets (ONETOUCH DELICA PLUS LANCET33G) MISC USE TO TEST ONCE DAILY 100 each  11    Scheduled:   atorvastatin  20 mg Oral QPM   clopidogrel  75 mg Oral QPM   famotidine  20 mg Oral BID   imipramine  50 mg Oral BID   levothyroxine  25 mcg Oral QAC breakfast   mometasone-formoterol  2 puff Inhalation BID   Assessment: 89yo female admitted from PCP OV for further w/u of orthostatic hypotension, went into Afib overnight, no known h/o Afib >> to begin heparin.  Goal of Therapy:  Heparin level 0.3-0.7 units/ml Monitor platelets by anticoagulation protocol: Yes   Plan:  Rec'd Lovenox 40mg  this pm so will forego heparin bolus. Start heparin infusion at 800 units/hr. Monitor heparin level and CBC.  Vernard Gambles, PharmD, BCPS  10/28/2023,12:13 AM

## 2023-10-29 ENCOUNTER — Other Ambulatory Visit (HOSPITAL_COMMUNITY): Payer: Self-pay

## 2023-10-29 DIAGNOSIS — I2510 Atherosclerotic heart disease of native coronary artery without angina pectoris: Secondary | ICD-10-CM | POA: Diagnosis not present

## 2023-10-29 DIAGNOSIS — I951 Orthostatic hypotension: Secondary | ICD-10-CM | POA: Diagnosis not present

## 2023-10-29 DIAGNOSIS — I4892 Unspecified atrial flutter: Secondary | ICD-10-CM

## 2023-10-29 LAB — GLUCOSE, CAPILLARY
Glucose-Capillary: 172 mg/dL — ABNORMAL HIGH (ref 70–99)
Glucose-Capillary: 206 mg/dL — ABNORMAL HIGH (ref 70–99)

## 2023-10-29 LAB — CBC
HCT: 40.7 % (ref 36.0–46.0)
Hemoglobin: 13 g/dL (ref 12.0–15.0)
MCH: 29.1 pg (ref 26.0–34.0)
MCHC: 31.9 g/dL (ref 30.0–36.0)
MCV: 91.1 fL (ref 80.0–100.0)
Platelets: 344 10*3/uL (ref 150–400)
RBC: 4.47 MIL/uL (ref 3.87–5.11)
RDW: 15 % (ref 11.5–15.5)
WBC: 6.6 10*3/uL (ref 4.0–10.5)
nRBC: 0 % (ref 0.0–0.2)

## 2023-10-29 LAB — BASIC METABOLIC PANEL
Anion gap: 5 (ref 5–15)
BUN: 20 mg/dL (ref 8–23)
CO2: 22 mmol/L (ref 22–32)
Calcium: 8.9 mg/dL (ref 8.9–10.3)
Chloride: 108 mmol/L (ref 98–111)
Creatinine, Ser: 1.1 mg/dL — ABNORMAL HIGH (ref 0.44–1.00)
GFR, Estimated: 48 mL/min — ABNORMAL LOW (ref >60)
Glucose, Bld: 141 mg/dL — ABNORMAL HIGH (ref 70–99)
Potassium: 3.9 mmol/L (ref 3.5–5.1)
Sodium: 135 mmol/L (ref 135–145)

## 2023-10-29 LAB — ACTH STIMULATION, 3 TIME POINTS
Cortisol, 30 Min: 20.3 ug/dL
Cortisol, Base: 5.3 ug/dL

## 2023-10-29 MED ORDER — APIXABAN 2.5 MG PO TABS
2.5000 mg | ORAL_TABLET | Freq: Two times a day (BID) | ORAL | 1 refills | Status: DC
  Filled 2023-10-29: qty 60, 30d supply, fill #0

## 2023-10-29 MED ORDER — METOPROLOL TARTRATE 25 MG PO TABS
25.0000 mg | ORAL_TABLET | Freq: Two times a day (BID) | ORAL | Status: DC
Start: 2023-10-29 — End: 2023-10-29
  Administered 2023-10-29: 25 mg via ORAL
  Filled 2023-10-29: qty 1

## 2023-10-29 MED ORDER — METOPROLOL TARTRATE 25 MG PO TABS
25.0000 mg | ORAL_TABLET | Freq: Two times a day (BID) | ORAL | 0 refills | Status: DC
  Filled 2023-10-29: qty 30, 15d supply, fill #0

## 2023-10-29 NOTE — Progress Notes (Signed)
Rounding Note    Patient Name: Christine [name] Date of Encounter: 10/29/2023  [facility] Cardiologist: Chilton Si, MD   Subjective   Denies any chest pain or dyspnea  Inpatient Medications    Scheduled Meds:  apixaban  2.5 mg Oral BID   atorvastatin  20 mg Oral QPM   famotidine  20 mg Oral Daily   imipramine  50 mg Oral BID   levothyroxine  25 mcg Oral QAC breakfast   mometasone-formoterol  2 puff Inhalation BID   Continuous Infusions:  PRN Meds: acetaminophen **OR** acetaminophen, senna-docusate   Vital Signs    Vitals:   10/28/23 1545 10/28/23 2120 10/29/23 0439 10/29/23 0743  BP: 121/70 98/64 122/76 (!) 155/97  Pulse: (!) 106 87 72 91  Resp: 18 18 18 18   Temp: 98.2 F (36.8 C) 97.8 F (36.6 C) 97.9 F (36.6 C) (!) 97.2 F (36.2 C)  TempSrc: Oral Oral Oral Oral  SpO2: 100% 99% 100% 100%  Weight:      Height:        Intake/Output Summary (Last 24 hours) at 10/29/2023 0820 Last data filed at 10/28/2023 1600 Gross per 24 hour  Intake 360 ml  Output --  Net 360 ml      10/27/2023    4:11 PM 10/27/2023   10:37 AM 09/15/2023    2:28 PM  Last 3 Weights  Weight (lbs) 129 lb 10.1 oz 132 lb 132 lb 4.8 oz  Weight (kg) 58.8 kg 59.875 kg 60.011 kg      Telemetry    Afib rates 100-110s - Personally Reviewed  ECG    NO new ECG - Personally Reviewed  Physical Exam   GEN: No acute distress.   Neck: No JVD Cardiac: irregular, normal rate, no murmurs, rubs, or gallops.  Respiratory: Clear to auscultation bilaterally. GI: Soft, nontender, non-distended  MS: No edema; No deformity. Neuro:  Nonfocal  Psych: Normal affect   Labs    High Sensitivity Troponin:  No results for input(s): "TROPONINIHS" in the last 720 hours.   Chemistry Recent Labs  Lab 10/27/23 1804 10/27/23 1806 10/28/23 0527 10/29/23 0651  NA  --  139 139 135  K  --  3.4* 4.1 3.9  CL  --  109 110 108  CO2  --  21* 21* 22  GLUCOSE  --  91 103* 141*  BUN  --   27* 23 20  CREATININE  --  1.22* 1.17* 1.10*  CALCIUM  --  9.1 8.9 8.9  MG 2.3  --   --   --   GFRNONAA  --  43* 45* 48*  ANIONGAP  --  9 8 5     Lipids No results for input(s): "CHOL", "TRIG", "HDL", "LABVLDL", "LDLCALC", "CHOLHDL" in the last 168 hours.  Hematology Recent Labs  Lab 10/27/23 1806 10/28/23 0527  WBC 8.0 6.6  RBC 3.98 4.01  HGB 11.8* 11.6*  HCT 35.4* 36.2  MCV 88.9 90.3  MCH 29.6 28.9  MCHC 33.3 32.0  RDW 14.7 15.0  PLT 315 311   Thyroid  Recent Labs  Lab 10/28/23 0527  TSH 2.696    BNPNo results for input(s): "BNP", "PROBNP" in the last 168 hours.  DDimer No results for input(s): "DDIMER" in the last 168 hours.   Radiology    ECHOCARDIOGRAM COMPLETE  Result Date: 10/28/2023    ECHOCARDIOGRAM REPORT   Patient Name:   Christine [name] Date of Exam: 10/28/2023 Medical Rec #:  [id number]      Height:       64.0 in Accession #:    [id number]     Weight:       129.6 lb Date of Birth:  09/26/1933       BSA:          1.627 m Patient Age:    89 years       BP:           128/72 mmHg Patient Gender: F              HR:           102 bpm. Exam Location:  Inpatient Procedure: 2D Echo, Color Doppler and Cardiac Doppler Indications:    I48.92* Unspecified atrial flutter  History:        Patient has prior history of Echocardiogram examinations, most                 recent 11/07/2017. Risk Factors:Hypertension, Diabetes and                 Dyslipidemia.  Sonographer:    Irving Burton Senior RDCS Referring Phys: [id number] ERIK C HOFFMAN  Sonographer Comments: Some windows limited by thin body habitus IMPRESSIONS  1. Left ventricular ejection fraction, by estimation, is 70 to 75%. Left ventricular ejection fraction by PLAX is 73 %. The left ventricle has hyperdynamic function. The left ventricle has no regional wall motion abnormalities. There is mild concentric left ventricular hypertrophy. Left ventricular diastolic function could not be evaluated.  2. Right ventricular systolic function is normal.  The right ventricular size is not well visualized. There is normal pulmonary artery systolic pressure. The estimated right ventricular systolic pressure is 24.5 mmHg.  3. Left atrial size was mildly dilated.  4. Right atrial size was mildly dilated.  5. The mitral valve is abnormal. Trivial mitral valve regurgitation.  6. The aortic valve has an indeterminant number of cusps. Aortic valve regurgitation is not visualized. Aortic valve sclerosis is present, with no evidence of aortic valve stenosis.  7. The inferior vena cava is normal in size with greater than 50% respiratory variability, suggesting right atrial pressure of 3 mmHg. FINDINGS  Left Ventricle: Left ventricular ejection fraction, by estimation, is 70 to 75%. Left ventricular ejection fraction by PLAX is 73 %. The left ventricle has hyperdynamic function. The left ventricle has no regional wall motion abnormalities. The left ventricular internal cavity size was normal in size. There is mild concentric left ventricular hypertrophy. Left ventricular diastolic function could not be evaluated due to atrial fibrillation. Left ventricular diastolic function could not be evaluated. Right Ventricle: The right ventricular size is not well visualized. No increase in right ventricular wall thickness. Right ventricular systolic function is normal. There is normal pulmonary artery systolic pressure. The tricuspid regurgitant velocity is 2.32 m/s, and with an assumed right atrial pressure of 3 mmHg, the estimated right ventricular systolic pressure is 24.5 mmHg. Left Atrium: Left atrial size was mildly dilated. Right Atrium: Right atrial size was mildly dilated. Pericardium: There is no evidence of pericardial effusion. Presence of epicardial fat layer. Mitral Valve: The mitral valve is abnormal. There is mild thickening of the mitral valve leaflet(s). There is mild calcification of the mitral valve leaflet(s). Trivial mitral valve regurgitation. Tricuspid Valve: The  tricuspid valve is normal in structure. Tricuspid valve regurgitation is trivial. Aortic Valve: The aortic valve has an indeterminant number of cusps. Aortic valve regurgitation is not visualized. Aortic valve sclerosis  is present, with no evidence of aortic valve stenosis. Pulmonic Valve: The pulmonic valve was normal in structure. Pulmonic valve regurgitation is not visualized. Aorta: The aortic root and ascending aorta are structurally normal, with no evidence of dilitation. Venous: The inferior vena cava is normal in size with greater than 50% respiratory variability, suggesting right atrial pressure of 3 mmHg. IAS/Shunts: The atrial septum is grossly normal.  LEFT VENTRICLE PLAX 2D LV EF:         Left ventricular ejection fraction by PLAX is 73 %. LVIDd:         2.70 cm LVIDs:         1.60 cm LV PW:         1.10 cm LV IVS:        1.10 cm LVOT diam:     1.80 cm LV SV:         38 LV SV Index:   23 LVOT Area:     2.54 cm  RIGHT VENTRICLE RV S prime:     13.40 cm/s TAPSE (M-mode): 1.7 cm LEFT ATRIUM             Index        RIGHT ATRIUM           Index LA diam:        2.90 cm 1.78 cm/m   RA Area:     19.50 cm LA Vol (A2C):   68.3 ml 41.98 ml/m  RA Volume:   51.40 ml  31.59 ml/m LA Vol (A4C):   49.2 ml 30.27 ml/m LA Biplane Vol: 62.7 ml 38.53 ml/m  AORTIC VALVE LVOT Vmax:   89.44 cm/s LVOT Vmean:  65.400 cm/s LVOT VTI:    0.148 m  AORTA Ao Root diam: 2.20 cm Ao Asc diam:  2.30 cm TRICUSPID VALVE TR Peak grad:   21.5 mmHg TR Vmax:        232.00 cm/s  SHUNTS Systemic VTI:  0.15 m Systemic Diam: 1.80 cm Rachelle Hora Croitoru MD Electronically signed by Thurmon Fair MD Signature Date/Time: 10/28/2023/3:42:42 PM    Final     Cardiac Studies     Patient Profile     89 y.o. female with a history of COPD, CAD, hypothyroidism, giant cell arteritis, PMR who we are consulted for evaluation of lightheadedness  Assessment & Plan    Atrial flutter: New diagnosis.  CHA2DS2-VASc score 6 (hypertension, age x 2,  diabetes, CAD, female).  Echo shows EF 70 to 75%, normal RV function, mild biatrial enlargement, no significant valvular disease -Continue Eliquis 2.5 mg twice daily (reduced dose given age/weight) -Rates elevated this morning, will add metoprolol 25 mg BID -If rates controlled on metoprolol, can plan for discharge with Afib clinic f/u in 2 weeks, and if remains in Afib/flutter at that time would plan for DCCV once completes 3 weeks of anticoagulation  Orthostatic hypotension: Likely secondary to antihypertensive use in setting of new onset atrial flutter.  Home amlodipine and valsartan were held.  Orthostatics improved yesterday  CAD: Cath in 2018 with 40% left main stenosis, 70% ostial D1.  Medical management recommended.  Can stop Plavix and starting Eliquis.  Continue atorvastatin  Hypertension: On amlodipine 10 mg and valsartan 320 mg daily at home.  Presented with orthostatic hypotension as above and antihypertensive have been held.  BP now elevated, adding metoprolol as above  For questions or updates, please contact [facility] Please consult [email or web address] for contact info under  Signed, [name], MD  10/29/2023, 8:20 AM

## 2023-10-29 NOTE — Plan of Care (Signed)

## 2023-10-29 NOTE — Hospital Course (Addendum)
#   New onset a flutter # Orthostatic hypotension Presented with 1 week worsening of dizziness and lightheadedness, worsened with positional changes and weakness. No syncope or fall. No room spinning sensation. No headaches, nausea, vomiting, diarrhea. No changes in medications. Reports adequate po intake. No recent sickness. Orthostatic vitals in clinic noted as 123/54 lying down, 118/80 sitting, 95/55 standing at 0 min. Labs showed K 3.4, Cr 1.22 [1.10], normal WBC, hgb 11.8 [11.9]. EKG showed new onset aflutter, with rate controlled. Cardiology consulted and appreciate recommendations. She was started on heparin gtt overnight, transitioned to Eliquis 2.5 mg BID (age appropriate dose).  Echo showed EF 70 to 75%, no regional wall motion abnormalities.  PT OT evaluate the patient without any recommendations at this time.  We held her home medications for hypertension during this visit due to orthostatic hypotension.  Appreciate cardiology following patient will likely go home with 3 weeks of outpatient Eliquis 2.5 mg twice daily then DCCV.  #Hx of GCA #Hx of PMR She has a history of GCA and PMR for which she takes prednisone as needed for flare up. She took prednisone 20 mg last night, did take couple of doses in October, otherwise, is not on chronic prednisone therapy since last year. She does endorse chronic lightheadedness for about a year, though worsening this past couple of days. There is some concern for secondary adrenal insufficiency due to chronic steroid use, a.m. cortisol checked 6.6.  Cosyntropin stimulation test is done during this visit, that showed *   #Chronic conditions CAD: Stopped Plavix, transition to Eliquis.  COPD: Continue home medication Dulera Hyperlipidemia: Continue home medication Lipitor 20 mg daily Hypothyroidism: Continue home medication Synthroid 25 mcg daily GERD: Continue home medication Pepcid 20 mg twice daily MDD: Per chart review, stopping TCA resulted psychosis.   Will continue TCA  ------------------------------------------------------------------  Ms. Tresa Endo,  You came to the hospital for worsening of dizziness and lightheadedness and shortness of breath upon exertion.  You were found to have a new onset a flutter.  We treated you with blood thinners and gave you fluids.  For your new onset a flutter -Eliquis 2.5 mg, take 1 tablet by mouth 2 times daily -Metoprolol tartrate 25 mg, take 1 tablet by mouth 2 times daily -Please follow-up with cardiology  For your blood pressure -Please stop amlodipine 10 mg and losartan to 20 mg for now until you are seen in the clinic to get evaluated for your blood pressure.  You did come in with low blood pressure reading and you are having these dizziness and lightheadedness.   For your coronary artery disease -Please stop Plavix/clopidogrel, we placed you on Eliquis.  Otherwise you can continue taking your medications as prescribed.  No other changes were made.  If you have any of these following symptoms, please call us or seek care at an emergency department: -Chest Pain -Difficulty Breathing -Worsening abdominal pain -Syncope (passing out) -Drooping of face -Slurred speech -Sudden weakness in your leg or arm -Fever -Chills -blood in the stool -dark black, sticky stool  If you have any questions or concerns, call our clinic at [phone] or after hours call [phone] and ask for the internal medicine resident on call.  I am glad you are feeling better. It was a pleasure taking care for you. I wish a good recovery and good health!   Dr. Jeral Pinch

## 2023-10-29 NOTE — Progress Notes (Signed)
Mobility Specialist Progress Note:    10/29/23 1038  Orthostatic Lying   BP- Lying 109/56 ((71))  Pulse- Lying 100  Orthostatic Sitting  BP- Sitting 118/80 ((91))  Pulse- Sitting 102  Orthostatic Standing at 0 minutes  BP- Standing at 0 minutes 108/70 ((83))  Pulse- Standing at 0 minutes 106  Orthostatic Standing at 3 minutes  BP- Standing at 3 minutes 130/78 (95)  Pulse- Standing at 3 minutes 108  Mobility  Activity Stood at bedside;Dangled on edge of bed;Ambulated with assistance in hallway;Ambulated with assistance in room  Level of Assistance Contact guard assist, steadying assist  Assistive Device Other (Comment) (HHA)  Distance Ambulated (ft) 100 ft  Activity Response Tolerated well  Mobility Referral Yes  Mobility visit 1 Mobility  Mobility Specialist Start Time (ACUTE ONLY) 1023  Mobility Specialist Stop Time (ACUTE ONLY) 1035  Mobility Specialist Time Calculation (min) (ACUTE ONLY) 12 min   Pt received in bed, family member at bedside. Agreeable to mobility session. Ambulated in hallway and took orthostatics throughout. C/o dizziness when first sitting EOB and first standing, but would resolve. Returned pt to room, all needs met, call bell in reach.   Post Mobility BP: 96/83 (89), HR 134 bpm (Standing) 132/57 (77), HR 108 bpm (Sitting EOB)   Feliciana Rossetti Mobility Specialist Please contact via Special educational needs teacher or  Rehab office at [phone]

## 2023-10-29 NOTE — Discharge Summary (Addendum)
Name: Christine [name] MRN: [id number] DOB: [birth year] 89 y.o. PCP: Default, Provider, MD  Date of Admission: 10/27/2023  3:40 PM Date of Discharge:  10/29/2023  Attending Physician: Dr. Cleda Daub  DISCHARGE DIAGNOSIS:  Primary Problem: Orthostatic hypotension   Hospital Problems: Principal Problem:   Orthostatic hypotension Active Problems:   Coronary artery disease involving native coronary artery of native heart   History of recent steroid use   Typical atrial flutter (HCC)   Atrial flutter (HCC)    DISCHARGE MEDICATIONS:   Allergies as of 10/29/2023       Reactions   Aspirin Swelling, Other (See Comments)   Angioedema   Atarax [hydroxyzine] Other (See Comments)   psychosis   Metoprolol Other (See Comments)   Chronotropic incompetence   Nsaids Other (See Comments)   "interacts with heart medications"   Codeine Nausea And Vomiting   Can take if premedicated with nausea medications   Haldol [haloperidol] Other (See Comments)   EPS - rigidity and dystonic reaction, very sensitive   Penicillins Diarrhea   "real bad" Has patient had a PCN reaction causing immediate rash, facial/tongue/throat swelling, SOB or lightheadedness with hypotension: Yes Has patient had a PCN reaction causing severe rash involving mucus membranes or skin necrosis: No Has patient had a PCN reaction that required hospitalization No Has patient had a PCN reaction occurring within the last 10 years: Yes If all of the above answers are "NO", then may proceed with Cephalosporin use.   Monosodium Glutamate Nausea Only, Other (See Comments)   Headaches        Medication List     STOP taking these medications    amLODipine 10 MG tablet Commonly known as: NORVASC   clopidogrel 75 MG tablet Commonly known as: PLAVIX   valsartan 320 MG tablet Commonly known as: DIOVAN       TAKE these medications    acetaminophen 500 MG tablet Commonly known as: TYLENOL Take 1,000 mg by mouth  every 6 (six) hours as needed for headache. For pain   atorvastatin 40 MG tablet Commonly known as: LIPITOR TAKE 1/2 TABLET BY MOUTH EVERY EVENING   cholecalciferol 25 MCG (1000 UNIT) tablet Commonly known as: VITAMIN D3 Take 1,000 Units by mouth daily.   Eliquis 2.5 MG Tabs tablet Generic drug: apixaban Take 1 tablet (2.5 mg total) by mouth 2 (two) times daily.   famotidine 20 MG tablet Commonly known as: Pepcid Take 1 tablet (20 mg total) by mouth 2 (two) times daily. Please schedule a yearly follow up for further refills.  Thank you   fluticasone 50 MCG/ACT nasal spray Commonly known as: FLONASE SHAKE LIQUID AND USE 1 SPRAY IN EACH NOSTRIL EVERY DAY   fluticasone-salmeterol 250-50 MCG/ACT Aepb Commonly known as: Wixela Inhub Inhale 1 puff into the lungs in the morning and at bedtime.   glimepiride 1 MG tablet Commonly known as: AMARYL TAKE 1 TABLET BY MOUTH EVERY DAY BEFORE BREAKFAST What changed:  how much to take how to take this when to take this additional instructions   imipramine 50 MG tablet Commonly known as: TOFRANIL TAKE 1 TABLET(50 MG) BY MOUTH TWICE DAILY What changed: See the new instructions.   levothyroxine 25 MCG tablet Commonly known as: SYNTHROID TAKE 1 TABLET BY MOUTH EVERY DAY What changed: when to take this   loratadine 10 MG tablet Commonly known as: CLARITIN Take 10 mg by mouth every evening.   metoprolol tartrate 25 MG tablet Commonly  known as: LOPRESSOR Take 1 tablet (25 mg total) by mouth 2 (two) times daily.   multivitamin with minerals Tabs tablet Take 1 tablet by mouth daily.   OneTouch Delica Plus Lancet33G Misc USE TO TEST ONCE DAILY   OneTouch Delica Plus Lancing Misc Check blood sugar one time daily before breakfast   OneTouch Verio Reflect w/Device Kit Check blood sugar one time daily before breakfast   OneTouch Verio test strip Generic drug: glucose blood Check blood sugar one time daily before breakfast    VITAMIN C PO Take 2 tablets by mouth daily.        DISPOSITION AND FOLLOW-UP:  Christine [name] was discharged from [facility] in Good condition. At the hospital follow up visit please address:  New onset Aflutter: Patient is discharged with Eliquis 2.5 mg twice daily and metoprolol tartrate 25 mg twice daily.  She is plan to follow-up with cardiology in 2 weeks, if she does not convert back to normal rhythm, they will plan on outpatient cardioversion.  Cardiology is okay with metoprolol tart 25 mg twice daily even though it is listed on as one of her allergy medications. History of GCA, PMR: During this inpatient, there is a concern for secondary adrenal insufficiency with her long-term steroid use that may be causing orthostatic hypotension, we checked her a.m. cortisol, it was 6.6, cosyntropin stimulation test was ordered during this visit, showed baseline cortisol at 5.3, cortisol at 30-minute is 20.3.  They could not perform the cortisol 60-minute.  However with the baseline cortisol at 20-minute is above 18, that shows normal adrenal function and rules out adrenal insufficency Orthostatic hypotension: We stopped her blood pressure medications, amlodipine 10 mg and valsartan 320 mg.  Patient is notified to not take these medications.  Please restart these medications as her blood pressure tolerates. CAD: We stopped her Plavix, she is transition to Eliquis 2.5 mg twice daily.  Follow-up Recommendations: Consults: None Labs: Basic Metabolic Profile and CBC Studies: None  Medications: Stop amlodipine and losartan and Plavix.  New medications consist of Eliquis 2.5 mg twice daily and metoprolol tart to 25 mg twice daily.  No other changes to medications were made.  Follow-up Appointments:  Follow-up Information     Eustace Pen, PA-C Follow up on 11/06/2023.   Specialty: Cardiology Contact information: [address] [place] [address] [phone]          Rocky Morel, DO Follow up on 11/07/2023.   Specialty: Internal Medicine Why: Hospital follow up Contact information: [address] [place] [address] [phone]                 HOSPITAL COURSE:  Patient Summary: # New onset a flutter # Orthostatic hypotension Presented with 1 week worsening of dizziness and lightheadedness, worsened with positional changes and weakness. No syncope or fall. No room spinning sensation. No headaches, nausea, vomiting, diarrhea. No changes in medications. Reports adequate po intake. No recent sickness. Orthostatic vitals in clinic noted as 123/54 lying down, 118/80 sitting, 95/55 standing at 0 min. Labs showed K 3.4, Cr 1.22 [1.10], normal WBC, hgb 11.8 [11.9]. EKG showed new onset aflutter, with rate controlled. Cardiology consulted and appreciate recommendations. She was started on heparin gtt overnight, transitioned to Eliquis 2.5 mg BID (age appropriate dose).  Echo showed EF 70 to 75%, no regional wall motion abnormalities, no RV disfunction.  PT OT evaluate the patient without any recommendations at this time.  We held her  home medications for hypertension during this visit due to orthostatic hypotension.  Appreciate cardiology recommendations, patient is started on metoprolol to tartrate 25 mg twice daily for rate control.  Cardiology will follow-up in 2 weeks, if she does not revert back to normal rhythm, they will to outpatient conversion.  Otherwise patient is discharged with Eliquis 2.5 mg twice daily and metoprolol 25 mg twice daily.  #Hx of GCA #Hx of PMR She has a history of GCA and PMR for which she takes prednisone as needed for flare up. She took prednisone 20 mg last night, did take couple of doses in October, otherwise, is not on chronic prednisone therapy since last year. She does endorse chronic lightheadedness for about a year, though worsening this past couple of days. There is some concern for secondary adrenal insufficiency  due to chronic steroid use, a.m. cortisol checked 6.6.  Cosyntropin stimulation test is done during this visit, that showed baseline cortisol at 5.3, cortisol at 30-minute is 20.3.  They could not perform the cortisol 60-minute because the lab did not collect it.  However with the baseline cortisol at 20-minute is above 18, that shows normal adrenal function at this time.     #Chronic conditions CAD: Stopped Plavix, transition to Eliquis.  COPD: Continue home medication Dulera Hyperlipidemia: Continue home medication Lipitor 20 mg daily Hypothyroidism: Continue home medication Synthroid 25 mcg daily GERD: Continue home medication Pepcid 20 mg twice daily MDD: Per chart review, stopping TCA resulted psychosis.  Will continue TCA  Discharge Instructions     Diet - low sodium heart healthy   Complete by: As directed    Discharge instructions   Complete by: As directed    Ms. Erspamer,  You came to the hospital for worsening of dizziness and lightheadedness and shortness of breath upon exertion.  You were found to have a new onset a flutter.  We treated you with blood thinners and gave you fluids.  For your new onset a flutter -Eliquis 2.5 mg, take 1 tablet by mouth 2 times daily -Metoprolol tartrate 25 mg, take 1 tablet by mouth 2 times daily -Please follow-up with cardiology  For your blood pressure -Please stop amlodipine 10 mg and losartan to 20 mg for now until you are seen in the clinic to get evaluated for your blood pressure.  You did come in with low blood pressure reading and you are having these dizziness and lightheadedness.   For your coronary artery disease -Please stop Plavix/clopidogrel, we placed you on Eliquis.  Otherwise you can continue taking your medications as prescribed.  No other changes were made.  If you have any of these following symptoms, please call us or seek care at an emergency department: -Chest Pain -Difficulty Breathing -Worsening abdominal  pain -Syncope (passing out) -Drooping of face -Slurred speech -Sudden weakness in your leg or arm -Fever -Chills -blood in the stool -dark black, sticky stool  If you have any questions or concerns, call our clinic at [phone] or after hours call [phone] and ask for the internal medicine resident on call.  I am glad you are feeling better. It was a pleasure taking care for you. I wish a good recovery and good health!   Dr. Jeral Pinch   Increase activity slowly   Complete by: As directed       SUBJECTIVE:  Patient is evaluated bedside today, family at bedside, denies any questions or concerns at this time.  Patient denies any headaches or lightheadedness or dizziness,  no chest pain shortness of breath.  No abdominal pain.  No nausea or vomiting.  She is tolerating p.o. intake well.  No other concerns at this time.  All questions were answered.  Discharge Vitals:   BP 117/63 (BP Location: Right Arm)   Pulse 91   Temp 97.9 F (36.6 C) (Oral)   Resp 19   Ht 5\' 4"  (1.626 m)   Wt 58.8 kg   LMP 02/11/1975 (Approximate)   SpO2 100%   BMI 22.25 kg/m   OBJECTIVE:  Physical Exam   Constitutional: Well-appearing, laying in bed comfortably, no acute distress HENT: normocephalic atraumatic, mucous membranes moist Eyes: conjunctiva non-erythematous Cardiovascular: regular rate, irregular rhythm. no lower extremity edema bilaterally. Pulmonary/Chest: normal work of breathing on room air, lungs clear to auscultation bilaterally Abdominal: soft, non-tender, non-distended, bowel sounds present MSK: normal bulk and tone Neurological: alert & oriented x 3, 5/5 strength in bilateral upper and lower extremities Skin: warm and dry Psych: Normal mood and affect  Pertinent Labs, Studies, and Procedures:     Latest Ref Rng & Units 10/29/2023    8:39 AM 10/28/2023    5:27 AM 10/27/2023    6:06 PM  CBC  WBC 4.0 - 10.5 K/uL 6.6  6.6  8.0   Hemoglobin 12.0 - 15.0 g/dL 16.1   09.6  04.5   Hematocrit 36.0 - 46.0 % 40.7  36.2  35.4   Platelets 150 - 400 K/uL 344  311  315        Latest Ref Rng & Units 10/29/2023    6:51 AM 10/28/2023    5:27 AM 10/27/2023    6:06 PM  CMP  Glucose 70 - 99 mg/dL 409  811  91   BUN 8 - 23 mg/dL 20  23  27    Creatinine 0.44 - 1.00 mg/dL 9.14  7.82  9.56   Sodium 135 - 145 mmol/L 135  139  139   Potassium 3.5 - 5.1 mmol/L 3.9  4.1  3.4   Chloride 98 - 111 mmol/L 108  110  109   CO2 22 - 32 mmol/L 22  21  21    Calcium 8.9 - 10.3 mg/dL 8.9  8.9  9.1     ECHOCARDIOGRAM COMPLETE  Result Date: 10/28/2023    ECHOCARDIOGRAM REPORT   Patient Name:   SHERAY ERLING Date of Exam: 10/28/2023 Medical Rec #:  [id number]      Height:       64.0 in Accession #:    [id number]     Weight:       129.6 lb Date of Birth:  14-Sep-1933       BSA:          1.627 m Patient Age:    89 years       BP:           128/72 mmHg Patient Gender: F              HR:           102 bpm. Exam Location:  Inpatient Procedure: 2D Echo, Color Doppler and Cardiac Doppler Indications:    I48.92* Unspecified atrial flutter  History:        Patient has prior history of Echocardiogram examinations, most                 recent 11/07/2017. Risk Factors:Hypertension, Diabetes and  Dyslipidemia.  Sonographer:    Irving Burton Senior RDCS Referring Phys: [id number] ERIK C HOFFMAN  Sonographer Comments: Some windows limited by thin body habitus IMPRESSIONS  1. Left ventricular ejection fraction, by estimation, is 70 to 75%. Left ventricular ejection fraction by PLAX is 73 %. The left ventricle has hyperdynamic function. The left ventricle has no regional wall motion abnormalities. There is mild concentric left ventricular hypertrophy. Left ventricular diastolic function could not be evaluated.  2. Right ventricular systolic function is normal. The right ventricular size is not well visualized. There is normal pulmonary artery systolic pressure. The estimated right ventricular systolic  pressure is 24.5 mmHg.  3. Left atrial size was mildly dilated.  4. Right atrial size was mildly dilated.  5. The mitral valve is abnormal. Trivial mitral valve regurgitation.  6. The aortic valve has an indeterminant number of cusps. Aortic valve regurgitation is not visualized. Aortic valve sclerosis is present, with no evidence of aortic valve stenosis.  7. The inferior vena cava is normal in size with greater than 50% respiratory variability, suggesting right atrial pressure of 3 mmHg. FINDINGS  Left Ventricle: Left ventricular ejection fraction, by estimation, is 70 to 75%. Left ventricular ejection fraction by PLAX is 73 %. The left ventricle has hyperdynamic function. The left ventricle has no regional wall motion abnormalities. The left ventricular internal cavity size was normal in size. There is mild concentric left ventricular hypertrophy. Left ventricular diastolic function could not be evaluated due to atrial fibrillation. Left ventricular diastolic function could not be evaluated. Right Ventricle: The right ventricular size is not well visualized. No increase in right ventricular wall thickness. Right ventricular systolic function is normal. There is normal pulmonary artery systolic pressure. The tricuspid regurgitant velocity is 2.32 m/s, and with an assumed right atrial pressure of 3 mmHg, the estimated right ventricular systolic pressure is 24.5 mmHg. Left Atrium: Left atrial size was mildly dilated. Right Atrium: Right atrial size was mildly dilated. Pericardium: There is no evidence of pericardial effusion. Presence of epicardial fat layer. Mitral Valve: The mitral valve is abnormal. There is mild thickening of the mitral valve leaflet(s). There is mild calcification of the mitral valve leaflet(s). Trivial mitral valve regurgitation. Tricuspid Valve: The tricuspid valve is normal in structure. Tricuspid valve regurgitation is trivial. Aortic Valve: The aortic valve has an indeterminant number of  cusps. Aortic valve regurgitation is not visualized. Aortic valve sclerosis is present, with no evidence of aortic valve stenosis. Pulmonic Valve: The pulmonic valve was normal in structure. Pulmonic valve regurgitation is not visualized. Aorta: The aortic root and ascending aorta are structurally normal, with no evidence of dilitation. Venous: The inferior vena cava is normal in size with greater than 50% respiratory variability, suggesting right atrial pressure of 3 mmHg. IAS/Shunts: The atrial septum is grossly normal.  LEFT VENTRICLE PLAX 2D LV EF:         Left ventricular ejection fraction by PLAX is 73 %. LVIDd:         2.70 cm LVIDs:         1.60 cm LV PW:         1.10 cm LV IVS:        1.10 cm LVOT diam:     1.80 cm LV SV:         38 LV SV Index:   23 LVOT Area:     2.54 cm  RIGHT VENTRICLE RV S prime:     13.40 cm/s TAPSE (M-mode): 1.7 cm LEFT  ATRIUM             Index        RIGHT ATRIUM           Index LA diam:        2.90 cm 1.78 cm/m   RA Area:     19.50 cm LA Vol (A2C):   68.3 ml 41.98 ml/m  RA Volume:   51.40 ml  31.59 ml/m LA Vol (A4C):   49.2 ml 30.27 ml/m LA Biplane Vol: 62.7 ml 38.53 ml/m  AORTIC VALVE LVOT Vmax:   89.44 cm/s LVOT Vmean:  65.400 cm/s LVOT VTI:    0.148 m  AORTA Ao Root diam: 2.20 cm Ao Asc diam:  2.30 cm TRICUSPID VALVE TR Peak grad:   21.5 mmHg TR Vmax:        232.00 cm/s  SHUNTS Systemic VTI:  0.15 m Systemic Diam: 1.80 cm Rachelle Hora Croitoru MD Electronically signed by Thurmon Fair MD Signature Date/Time: 10/28/2023/3:42:42 PM    Final      Signed: Jeral Pinch, D.O.  Internal Medicine Resident, PGY-1 [facility]  Pager: [phone] 2:13 PM, 10/29/2023

## 2023-10-30 ENCOUNTER — Telehealth: Payer: Self-pay

## 2023-10-30 NOTE — Transitions of Care (Post Inpatient/ED Visit) (Signed)
   10/30/2023  Name: Christine [name] MRN: [id number] DOB: Dec 12, 1932  Today's TOC FU Call Status: Today's TOC FU Call Status:: Unsuccessful Call (1st Attempt) Unsuccessful Call (1st Attempt) Date: 10/30/23  Attempted to reach the patient regarding the most recent Inpatient/ED visit.  Follow Up Plan: Additional outreach attempts will be made to reach the patient to complete the Transitions of Care (Post Inpatient/ED visit) call.   Jodelle Gross RN, BSN, CCM RN Care Manager  Transitions of Care  [facility]  [phone]

## 2023-10-31 ENCOUNTER — Telehealth: Payer: Self-pay

## 2023-10-31 NOTE — Progress Notes (Signed)
Internal Medicine Clinic Attending  Case discussed with the resident at the time of the visit.  We reviewed the resident's history and exam and pertinent patient test results.  I agree with the assessment, diagnosis, and plan of care documented in the resident's note.  

## 2023-10-31 NOTE — Transitions of Care (Post Inpatient/ED Visit) (Signed)
10/31/2023  Name: Christine [name] MRN: [id number] DOB: 04-May-1933  Today's TOC FU Call Status: Today's TOC FU Call Status:: Successful TOC FU Call Completed TOC FU Call Complete Date: 10/31/23 Patient's Name and Date of Birth confirmed.  Transition Care Management Follow-up Telephone Call How have you been since you were released from the hospital?: Same (daughter reports that patient has been confused about medications. Daughter reports BP is good.) Any questions or concerns?: No  Items Reviewed: Did you receive and understand the discharge instructions provided?: Yes Medications obtained,verified, and reconciled?: Yes (Medications Reviewed) Any new allergies since your discharge?: No Dietary orders reviewed?: Yes Type of Diet Ordered:: low salt heart healthy diet Do you have support at home?: Yes People in Home: child(ren), adult Name of Support/Comfort Primary Source: Rene Kocher  Medications Reviewed Today: Medications Reviewed Today     Reviewed by Earlie Server, RN (Registered Nurse) on 10/31/23 at [phone]  Med List Status: <None>   Medication Order Taking? Sig Documenting Provider Last Dose Status Informant  acetaminophen (TYLENOL) 500 MG tablet [id number] Yes Take 1,000 mg by mouth every 6 (six) hours as needed for headache. For pain [provider] Taking Active Child, Self  apixaban (ELIQUIS) 2.5 MG TABS tablet [id number] Yes Take 1 tablet (2.5 mg total) by mouth 2 (two) times daily. Jeral Pinch, DO Taking Active   Ascorbic Acid (VITAMIN C PO) [id number] Yes Take 2 tablets by mouth daily.  [provider] Taking Active Child, Self  atorvastatin (LIPITOR) 40 MG tablet [id number] Yes TAKE 1/2 TABLET BY MOUTH EVERY EVENING  Patient taking differently: Take 20 mg by mouth every evening.   Inez Catalina, MD Taking Active Child, Self  Blood Glucose Monitoring Suppl [facility] VERIO REFLECT) w/Device Andria Rhein [id number] Yes Check blood sugar one time daily before  breakfast Inez Catalina, MD Taking Active Child, Self  cholecalciferol 25 MCG (1000 UT) tablet [id number] Yes Take 1,000 Units by mouth daily. [provider] Taking Active Self, Child  famotidine (PEPCID) 20 MG tablet [id number] Yes Take 1 tablet (20 mg total) by mouth 2 (two) times daily. Please schedule a yearly follow up for further refills.  Thank you  Patient taking differently: Take 20 mg by mouth daily. Please schedule a yearly follow up for further refills.  Thank you   Inez Catalina, MD Taking Active Child, Self  fluticasone (FLONASE) 50 MCG/ACT nasal spray [id number] Yes SHAKE LIQUID AND USE 1 SPRAY IN EACH NOSTRIL EVERY DAY Inez Catalina, MD Taking Active Child, Self  fluticasone-salmeterol [facility] INHUB) 250-50 MCG/ACT AEPB [id number] Yes Inhale 1 puff into the lungs in the morning and at bedtime. Inez Catalina, MD Taking Active Child, Self  glimepiride (AMARYL) 1 MG tablet [id number] Yes TAKE 1 TABLET BY MOUTH EVERY DAY BEFORE BREAKFAST  Patient taking differently: Take 1 mg by mouth daily with breakfast.   Inez Catalina, MD Taking Active Child, Self  glucose blood (ONETOUCH VERIO) test strip [id number] Yes Check blood sugar one time daily before breakfast Inez Catalina, MD Taking Active Child, Self  imipramine (TOFRANIL) 50 MG tablet [id number] Yes TAKE 1 TABLET(50 MG) BY MOUTH TWICE DAILY  Patient taking differently: Take 50 mg by mouth 2 (two) times daily.   Inez Catalina, MD Taking Active Child, Self  Lancet Devices [facility] PLUS LANCING) MISC [id number] Yes Check blood sugar one time daily before breakfast Inez Catalina, MD Taking Active Child, Self  Lancets [facility] DELICA PLUS [place]) MISC  [id number] Yes USE TO TEST ONCE DAILY Inez Catalina, MD Taking Active Child, Self  levothyroxine (SYNTHROID) 25 MCG tablet [id number] Yes TAKE 1 TABLET BY MOUTH EVERY DAY  Patient taking differently: Take 25 mcg by mouth daily before breakfast.   Inez Catalina,  MD Taking Active Child, Self  loratadine (CLARITIN) 10 MG tablet [id number] Yes Take 10 mg by mouth every evening. [provider] Taking Active Child, Self  metoprolol tartrate (LOPRESSOR) 25 MG tablet [id number] Yes Take 1 tablet (25 mg total) by mouth 2 (two) times daily. Jeral Pinch, DO Taking Active   Multiple Vitamin (MULTIVITAMIN WITH MINERALS) TABS tablet [id number] Yes Take 1 tablet by mouth daily. Althia Forts, MD Taking Active Child, Self  Med List Note Heloise Beecham, Wynelle Cleveland, MD 05/03/13 [phone]):               Home Care and Equipment/Supplies: Were Home Health Services Ordered?: No Any new equipment or medical supplies ordered?: No  Functional Questionnaire: Do you need assistance with bathing/showering or dressing?: Yes (daughter assist) Do you need assistance with meal preparation?: Yes (family manages) Do you need assistance with eating?: No Do you have difficulty maintaining continence: No (uses walker to go to the bathroom.) Do you need assistance with getting out of bed/getting out of a chair/moving?: No Do you have difficulty managing or taking your medications?: Yes (daughter is manage her medications)  Follow up appointments reviewed: PCP Follow-up appointment confirmed?: Yes Date of PCP follow-up appointment?: 11/07/23 Follow-up Provider: Dr. Geraldo Pitter [facility] Follow-up appointment confirmed?: Yes Date of Specialist follow-up appointment?: 11/06/23 Follow-Up Specialty Provider:: Cardiology Do you need transportation to your follow-up appointment?: No Do you understand care options if your condition(s) worsen?: Yes-patient verbalized understanding  SDOH Interventions Today    Flowsheet Row Most Recent Value  SDOH Interventions   Food Insecurity Interventions Intervention Not Indicated  Housing Interventions Intervention Not Indicated  Transportation Interventions Intervention Not Indicated  Utilities Interventions Intervention Not  Indicated      Daughter is DPR.  Spoke with daughter for [facility] call. Daughter reports patient is slightly confused about her medications and daughter is assisting. Daughter reports patient is doing much better than prior to admission. Daughter reports no low BP at home.  Daughter states that she and her family manages all of patients care.   Reviewed 30 day TOC program and that patient is active with [facility] management team. Daughter prefers to follow up with assigned case manager, Lacretia Nicks.   I scheduled patient for follow up call next week.  Provided my contact information if daughter needs to call me back. Reviewed signs and symptoms of bleeding and when to call 911. Daughter voiced understanding.   Lonia Chimera, RN, BSN, CEN [employer]- Transition of Care Team.  [facility] [phone]

## 2023-11-05 ENCOUNTER — Ambulatory Visit: Payer: Self-pay | Admitting: *Deleted

## 2023-11-05 NOTE — Patient Outreach (Addendum)
  Care Coordination   Follow Up Visit Note   11/05/2023 Name: Christine [name] MRN: [id number] DOB: 1933/08/30  Christine [name] is a 89 y.o. year old female who sees Rocky Morel, DO for primary care. I spoke with Carmie Kanner, daughter of Christine [name] by phone today.  What matters to the patients health and wellness today?  Patient admitted to hospital 12/9-12/11 for hypotension, diagnosed with new onset A-flutter.  Doing better since discharge.  Denies any urgent concerns, encouraged to contact this care manager with questions.      Goals Addressed             This Visit's Progress    COMPLETED: Effective management of DM   On track    Care Coordination Interventions: Provided education to patient about basic DM disease process Reviewed medications with patient and discussed importance of medication adherence Counseled on importance of regular laboratory monitoring as prescribed Discussed plans with patient for ongoing care management follow up and provided patient with direct contact information for care management team Provided patient with written educational materials related to hypo and hyperglycemia and importance of correct treatment      COMPLETED: Management of HTN   On track    Interventions Today    Flowsheet Row Most Recent Value  Chronic Disease   Chronic disease during today's visit Atrial Fibrillation (AFib), Diabetes, Congestive Heart Failure (CHF), Hypertension (HTN)  [New onset A-flutter]  General Interventions   General Interventions Discussed/Reviewed General Interventions Reviewed, Labs, Doctor Visits  [Fatigue and shortness of breath has decreased]  Labs Hgb A1c every 3 months  [Currently 7.5, due for new reading]  Doctor Visits Discussed/Reviewed Doctor Visits Reviewed, PCP, Specialist  [Upcoming with A-fib clinic 12/19 and PCP 12/20]  PCP/Specialist Visits Compliance with follow-up visit  Education Interventions   Education Provided Provided Education   Provided Verbal Education On Blood Sugar Monitoring, Medication, When to see the doctor  [Discussed medication changes, stopped Diovan and Plavix, now taking Eliquis and Metoprolol.  Encouraged to monitor BP/HR as well as glucose. BP has been stable, range 130s/60s]  Pharmacy Interventions   Pharmacy Dicussed/Reviewed Pharmacy Topics Reviewed, Affording Medications  [Advised daughter to notify  this care manager if Eliquis is too expensive.  Received initial prescription from the hospital]                SDOH assessments and interventions completed:  No     Care Coordination Interventions:  Yes, provided   Follow up plan: Follow up call scheduled for 12/31    Encounter Outcome:  Patient Visit Completed   Rodney Langton, RN, MSN, CCM [place]  [facility], [facility] RN Care Coordinator Direct Dial: [phone] / Main [phone] Fax [phone] Email: [email or web address]: [email or web address]

## 2023-11-06 ENCOUNTER — Other Ambulatory Visit: Payer: Self-pay

## 2023-11-06 ENCOUNTER — Ambulatory Visit (HOSPITAL_COMMUNITY)
Admit: 2023-11-06 | Discharge: 2023-11-06 | Disposition: A | Discharge status: Home / Self Care | Payer: Medicare Other | Source: Ambulatory Visit | Attending: Internal Medicine | Admitting: Internal Medicine

## 2023-11-06 VITALS — BP 160/62 | HR 60 | Ht 64.0 in | Wt 133.0 lb

## 2023-11-06 DIAGNOSIS — I4892 Unspecified atrial flutter: Secondary | ICD-10-CM

## 2023-11-06 DIAGNOSIS — I10 Essential (primary) hypertension: Secondary | ICD-10-CM | POA: Diagnosis not present

## 2023-11-06 DIAGNOSIS — Z7901 Long term (current) use of anticoagulants: Secondary | ICD-10-CM | POA: Insufficient documentation

## 2023-11-06 DIAGNOSIS — E039 Hypothyroidism, unspecified: Secondary | ICD-10-CM | POA: Insufficient documentation

## 2023-11-06 DIAGNOSIS — I4891 Unspecified atrial fibrillation: Secondary | ICD-10-CM | POA: Insufficient documentation

## 2023-11-06 DIAGNOSIS — M315 Giant cell arteritis with polymyalgia rheumatica: Secondary | ICD-10-CM | POA: Insufficient documentation

## 2023-11-06 DIAGNOSIS — I484 Atypical atrial flutter: Secondary | ICD-10-CM | POA: Insufficient documentation

## 2023-11-06 DIAGNOSIS — D6869 Other thrombophilia: Secondary | ICD-10-CM | POA: Diagnosis not present

## 2023-11-06 DIAGNOSIS — I2510 Atherosclerotic heart disease of native coronary artery without angina pectoris: Secondary | ICD-10-CM | POA: Diagnosis not present

## 2023-11-06 DIAGNOSIS — R9431 Abnormal electrocardiogram [ECG] [EKG]: Secondary | ICD-10-CM | POA: Diagnosis not present

## 2023-11-06 DIAGNOSIS — J449 Chronic obstructive pulmonary disease, unspecified: Secondary | ICD-10-CM | POA: Insufficient documentation

## 2023-11-06 DIAGNOSIS — E119 Type 2 diabetes mellitus without complications: Secondary | ICD-10-CM | POA: Diagnosis not present

## 2023-11-06 DIAGNOSIS — I480 Paroxysmal atrial fibrillation: Secondary | ICD-10-CM

## 2023-11-06 DIAGNOSIS — Z7984 Long term (current) use of oral hypoglycemic drugs: Secondary | ICD-10-CM | POA: Diagnosis not present

## 2023-11-06 MED ORDER — APIXABAN 2.5 MG PO TABS: 2.5000 mg | ORAL_TABLET | Freq: Two times a day (BID) | ORAL | 6 refills | Status: DC

## 2023-11-06 MED ORDER — METOPROLOL TARTRATE 25 MG PO TABS: 25.0000 mg | ORAL_TABLET | Freq: Two times a day (BID) | ORAL | 6 refills | Status: DC

## 2023-11-06 NOTE — Progress Notes (Signed)
Primary Care Physician: Rocky Morel, DO Primary Cardiologist: Chilton Si, MD Electrophysiologist: None     Referring Physician: Dr. Luellen Christine [name] is a 89 y.o. female with a history of COPD, CAD, HTN, T2DM, hypothyroidism, giant cell arteritis, PMR, and paroxysmal atrial flutter who presents for consultation in the [facility]. Hospital admission 12/9-11/24 for lightheadedness found to be in new onset atrial flutter. Discharged on Lopressor 25 mg BID. Plavix stopped. Patient is on Eliquis 2.5 mg BID for a CHADS2VASC score of 6.  On evaluation today, she is currently in NSR. She feels better since hospital discharge and no longer feels lightheaded. No bleeding issues on Eliquis.    Today, she denies symptoms of palpitations, chest pain, shortness of breath, orthopnea, PND, lower extremity edema, dizziness, presyncope, syncope, snoring, daytime somnolence, bleeding, or neurologic sequela. The patient is tolerating medications without difficulties and is otherwise without complaint today.   she has a BMI of Body mass index is 22.83 kg/m.Marland Kitchen Filed Weights   11/06/23 1506  Weight: 60.3 kg    Current Outpatient Medications  Medication Sig Dispense Refill   acetaminophen (TYLENOL) 500 MG tablet Take 1,000 mg by mouth as needed for headache. For pain     Ascorbic Acid (VITAMIN C PO) Take 2 tablets by mouth daily.      atorvastatin (LIPITOR) 40 MG tablet TAKE 1/2 TABLET BY MOUTH EVERY EVENING (Patient taking differently: Take 20 mg by mouth every evening.) 45 tablet 3   Blood Glucose Monitoring Suppl (ONETOUCH VERIO REFLECT) w/Device KIT Check blood sugar one time daily before breakfast 1 kit 1   cholecalciferol 25 MCG (1000 UT) tablet Take 1,000 Units by mouth daily.     famotidine (PEPCID) 20 MG tablet Take 1 tablet (20 mg total) by mouth 2 (two) times daily. Please schedule a yearly follow up for further refills.  Thank you (Patient taking  differently: Take 20 mg by mouth daily. Please schedule a yearly follow up for further refills.  Thank you) 180 tablet 2   fluticasone (FLONASE) 50 MCG/ACT nasal spray SHAKE LIQUID AND USE 1 SPRAY IN EACH NOSTRIL EVERY DAY 16 g 2   fluticasone-salmeterol (WIXELA INHUB) 250-50 MCG/ACT AEPB Inhale 1 puff into the lungs in the morning and at bedtime. 60 each 3   glimepiride (AMARYL) 1 MG tablet TAKE 1 TABLET BY MOUTH EVERY DAY BEFORE BREAKFAST (Patient taking differently: Take 1 mg by mouth daily with breakfast.) 90 tablet 3   glucose blood (ONETOUCH VERIO) test strip Check blood sugar one time daily before breakfast 100 each 5   imipramine (TOFRANIL) 50 MG tablet TAKE 1 TABLET(50 MG) BY MOUTH TWICE DAILY (Patient taking differently: Take 50 mg by mouth 2 (two) times daily.) 180 tablet 3   Lancet Devices (ONETOUCH DELICA PLUS LANCING) MISC Check blood sugar one time daily before breakfast 100 each 5   Lancets (ONETOUCH DELICA PLUS LANCET33G) MISC USE TO TEST ONCE DAILY 100 each 11   levothyroxine (SYNTHROID) 25 MCG tablet TAKE 1 TABLET BY MOUTH EVERY DAY (Patient taking differently: Take 25 mcg by mouth daily before breakfast.) 90 tablet 1   loratadine (CLARITIN) 10 MG tablet Take 10 mg by mouth every evening.     Multiple Vitamin (MULTIVITAMIN WITH MINERALS) TABS tablet Take 1 tablet by mouth daily. 30 tablet 2   apixaban (ELIQUIS) 2.5 MG TABS tablet Take 1 tablet (2.5 mg total) by mouth 2 (two) times daily. 60 tablet 6  metoprolol tartrate (LOPRESSOR) 25 MG tablet Take 1 tablet (25 mg total) by mouth 2 (two) times daily. 60 tablet 6   No current facility-administered medications for this encounter.    Atrial Fibrillation Management history:  Previous antiarrhythmic drugs: none Previous cardioversions: none Previous ablations: none Anticoagulation history: Eliquis 2.5 mg BID   ROS- All systems are reviewed and negative except as per the HPI above.  Physical Exam: BP (!) 160/62   Pulse 60    Ht 5\' 4"  (1.626 m)   Wt 60.3 kg   LMP 02/11/1975 (Approximate)   BMI 22.83 kg/m   GEN: Well nourished, well developed in no acute distress NECK: No JVD; No carotid bruits CARDIAC: Regular rate and rhythm, no murmurs, rubs, gallops RESPIRATORY:  Clear to auscultation without rales, wheezing or rhonchi  ABDOMEN: Soft, non-tender, non-distended EXTREMITIES:  No edema; No deformity   EKG today demonstrates  Vent. rate 60 BPM PR interval 198 ms QRS duration 86 ms QT/QTcB 438/438 ms P-R-T axes 100 -54 60 Sinus rhythm with Premature atrial complexes Left anterior fascicular block Anteroseptal infarct , age undetermined Abnormal ECG When compared with ECG of 28-Oct-2023 06:39, PREVIOUS ECG IS PRESENT  Echo 10/28/23 demonstrated  1. Left ventricular ejection fraction, by estimation, is 70 to 75%. Left  ventricular ejection fraction by PLAX is 73 %. The left ventricle has  hyperdynamic function. The left ventricle has no regional wall motion  abnormalities. There is mild concentric  left ventricular hypertrophy. Left ventricular diastolic function could  not be evaluated.   2. Right ventricular systolic function is normal. The right ventricular  size is not well visualized. There is normal pulmonary artery systolic  pressure. The estimated right ventricular systolic pressure is 24.5 mmHg.   3. Left atrial size was mildly dilated.   4. Right atrial size was mildly dilated.   5. The mitral valve is abnormal. Trivial mitral valve regurgitation.   6. The aortic valve has an indeterminant number of cusps. Aortic valve  regurgitation is not visualized. Aortic valve sclerosis is present, with  no evidence of aortic valve stenosis.   7. The inferior vena cava is normal in size with greater than 50%  respiratory variability, suggesting right atrial pressure of 3 mmHg.   ASSESSMENT & PLAN CHA2DS2-VASc Score = 6  The patient's score is based upon: CHF History: 0 HTN History: 1 Diabetes  History: 1 Stroke History: 0 Vascular Disease History: 1 Age Score: 2 Gender Score: 1       ASSESSMENT AND PLAN: Paroxysmal Atrial Flutter The patient's CHA2DS2-VASc score is 6, indicating a 9.7% annual risk of stroke.    She is currently in NSR. Education provided about Afib. Discussion about medication treatments if increased burden is noted. After discussion, we will proceed with conservative observation at this time. Rhythm monitoring device recommended.    Secondary Hypercoagulable State (ICD10:  D68.69) The patient is at significant risk for stroke/thromboembolism based upon her CHA2DS2-VASc Score of 6.  Continue Apixaban (Eliquis).  Recommend CBC at upcoming cardiology appointment.  Continue without interruption.    Follow up as scheduled with Cardiology.   Lake Bells, PA-C  Afib Clinic [facility] [address] [place], [place] [address] [phone]

## 2023-11-07 ENCOUNTER — Other Ambulatory Visit: Payer: Self-pay | Admitting: Internal Medicine

## 2023-11-07 ENCOUNTER — Ambulatory Visit: Payer: Medicare Other | Admitting: Student

## 2023-11-07 VITALS — BP 148/63 | HR 59 | Temp 98.4°F | Ht 64.0 in | Wt 133.8 lb

## 2023-11-07 DIAGNOSIS — E119 Type 2 diabetes mellitus without complications: Secondary | ICD-10-CM

## 2023-11-07 DIAGNOSIS — E785 Hyperlipidemia, unspecified: Secondary | ICD-10-CM

## 2023-11-07 DIAGNOSIS — F3342 Major depressive disorder, recurrent, in full remission: Secondary | ICD-10-CM

## 2023-11-07 DIAGNOSIS — I4892 Unspecified atrial flutter: Secondary | ICD-10-CM | POA: Diagnosis not present

## 2023-11-07 DIAGNOSIS — E039 Hypothyroidism, unspecified: Secondary | ICD-10-CM

## 2023-11-07 DIAGNOSIS — Z7901 Long term (current) use of anticoagulants: Secondary | ICD-10-CM | POA: Diagnosis not present

## 2023-11-07 DIAGNOSIS — I10 Essential (primary) hypertension: Secondary | ICD-10-CM | POA: Diagnosis not present

## 2023-11-07 DIAGNOSIS — Z7984 Long term (current) use of oral hypoglycemic drugs: Secondary | ICD-10-CM

## 2023-11-07 DIAGNOSIS — M316 Other giant cell arteritis: Secondary | ICD-10-CM

## 2023-11-07 DIAGNOSIS — I951 Orthostatic hypotension: Secondary | ICD-10-CM

## 2023-11-07 DIAGNOSIS — K219 Gastro-esophageal reflux disease without esophagitis: Secondary | ICD-10-CM

## 2023-11-07 LAB — POCT GLYCOSYLATED HEMOGLOBIN (HGB A1C): Hemoglobin A1C: 8 % — AB (ref 4.0–5.6)

## 2023-11-07 LAB — GLUCOSE, CAPILLARY: Glucose-Capillary: 129 mg/dL — ABNORMAL HIGH (ref 70–99)

## 2023-11-07 MED ORDER — PREDNISONE 20 MG PO TABS: 20.0000 mg | ORAL_TABLET | Freq: Every day | ORAL | 0 refills | Status: DC

## 2023-11-07 MED ORDER — AMLODIPINE BESYLATE 5 MG PO TABS: 5.0000 mg | ORAL_TABLET | Freq: Every day | ORAL | 2 refills | Status: DC

## 2023-11-07 NOTE — Progress Notes (Unsigned)
CC: Hospital Follow Up   HPI:  Christine [name] is a 89 y.o. female with pertinent PMH of HTN, COPD, CAD, hypothyroidism, giant cell arteritis/PMR, and new onset a flutter who was hospitalized from 12/9 - 12/11 for orthostatic hypotension and new onset a flutter. Please see assessment and plan below for further details.  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 LHC 08/23/11: dLM 40-50%, oRI 40%, oCFX 40%, oD1 70% (small and not amenable to PCI).  LM lesion did not appear to be flow limiting.  Medical rx was recommended;  b. Echo 08/23/11: mild LVH, EF 60-65%, grade 1 diast dysfxn, mild BAE, PASP 24;  c. 02/2012  Cath: LM 50d, LAD 50p, D1 70ost, RI 70p, RCA ok->Med Rx;  d. 01/2013 Cardiolite: EF 88, no ischemia/infarct.   Carotid stenosis    a. dopplers 10/12:  0-39% bilat ICA;  b. 10/2012 U/S: 0-39% bilat, f/u 1 yr (10/2013).   Chronic bronchitis    Chronotropic incompetence 05/01/2021   Depression    Diverticulosis of colon (without mention of hemorrhage)    GERD (gastroesophageal reflux disease)    Heart murmur, systolic    2-D echo in December 2011 showed a normal EF with grade 1 diastolic dysfunction, trivial pulmonary regurgitation and mildly elevated PA pressure at 37 mmHg probably secondary to her COPD.dynamic obstruction-mid cavity obliteration;  b. 02/2012 Echo: EF 60-65%, mild LVH, PASP .   History of stomach ulcers 1970's   Hyperlipidemia    Hypertension    Hypothyroidism    Migraines    a. Next atypical symptoms in the past. Patient was started on Neurontin for possible neuropathic origin of her pain.   Polymyalgia  rheumatica (HCC)    Premature atrial contraction 05/01/2021   She remains asymptomatic.  No medication indicated.   Shortness of breath on exertion    "just related to angina >1 yr ago" (09/23/2013)   Sinus arrhythmia    Solitary pulmonary nodule 12/15/2006   CT Chest in January 2008  IMPRESSION:  1. Stable CT of the chest with mild biapical scarring and scattered nodularity, most likely postinflammatory in the absence of a history of malignancy.  2. No acute chest findings are demonstrated.  3. Unless the patient has a history of malignancy or risk factors for lung cancer, the chest findings do not necessarily require any specific followup. If follow    Type II diabetes mellitus (HCC)    a. On oral hypoglycemic agents.    Current Outpatient Medications  Medication Instructions   acetaminophen (TYLENOL) 1,000 mg, As needed   amLODipine (NORVASC) 5 mg, Oral, Daily   apixaban (ELIQUIS) 2.5 mg, Oral, 2 times daily   Ascorbic Acid (VITAMIN C PO) 2 tablets, Daily   atorvastatin (LIPITOR) 20 mg, Oral, Every evening   Blood Glucose Monitoring Suppl (ONETOUCH VERIO REFLECT) w/Device KIT Check blood sugar one time daily before breakfast   cholecalciferol (VITAMIN D3) 1,000 Units, Daily   famotidine (PEPCID) 20 MG tablet TAKE 1 TABLET(20 MG) BY MOUTH TWICE DAILY. FOLLOW UP FOR FURTHER REFILLS. THANK YOU  fluticasone (FLONASE) 50 MCG/ACT nasal spray SHAKE LIQUID AND USE 1 SPRAY IN EACH NOSTRIL EVERY DAY   fluticasone-salmeterol (WIXELA INHUB) 250-50 MCG/ACT AEPB 1 puff, Inhalation, 2 times daily   glimepiride (AMARYL) 1 MG tablet TAKE 1 TABLET BY MOUTH EVERY DAY BEFORE BREAKFAST   glucose blood (ONETOUCH VERIO) test strip Check blood sugar one time daily before breakfast   imipramine (TOFRANIL) 50 MG tablet TAKE 1 TABLET(50 MG) BY MOUTH TWICE DAILY   Lancet Devices (ONETOUCH DELICA PLUS LANCING) MISC Check blood sugar one time daily before breakfast   Lancets (ONETOUCH DELICA PLUS LANCET33G) MISC USE  TO TEST ONCE DAILY   levothyroxine (SYNTHROID) 25 mcg, Oral, Daily   loratadine (CLARITIN) 10 mg, Every evening   metoprolol tartrate (LOPRESSOR) 25 mg, Oral, 2 times daily   Multiple Vitamin (MULTIVITAMIN WITH MINERALS) TABS tablet 1 tablet, Oral, Daily   predniSONE (DELTASONE) 20 mg, Oral, Daily with breakfast     Review of Systems:   Pertinent items noted in HPI and/or A&P.  Physical Exam:  Vitals:   11/07/23 1032 11/07/23 1100 11/07/23 1102 11/07/23 1105  BP: (!) 161/51 (!) 147/65 (!) 145/66 (!) 148/63  Pulse: 66 66 64 (!) 59  Temp: 98.4 F (36.9 C)     SpO2: 100%     Weight: 133 lb 12.8 oz (60.7 kg)     Height: 5\' 4"  (1.626 m)       Constitutional: Well-appearing elderly female. In no acute distress. HEENT: Normocephalic, atraumatic, Sclera non-icteric, PERRL, EOM intact Cardio:Regular rate and rhythm. 2+ bilateral radial pulses. Pulm:Clear to auscultation bilaterally. Normal work of breathing on room air. Abdomen: Soft, non-tender, non-distended, positive bowel sounds. UJW:[id number] for extremity edema. Skin:Warm and dry. Neuro:Alert and oriented x3. No focal deficit noted. Psych:Pleasant mood and affect.   Assessment & Plan:   Essential hypertension Patient presents for follow-up after new onset atrial flutter in the hospital and orthostatic hypotension.  During hospitalization she was started on metoprolol titrate 25 mg twice daily and due to her orthostatic hypotension amlodipine and valsartan were stopped.  Since discharge she has not had any orthostatic dizziness until this morning but attributes it to being more active this morning than most.  Orthostatic vital signs here in the office were negative.  Blood pressure here mildly elevated at 148/63. - Restart amlodipine 5 mg daily and continue metoprolol tartrate 25 mg twice daily  Atrial flutter (HCC) Today patient is in normal sinus rhythm on physical exam.  She has tolerated anticoagulation and rate control.   Followed up with cardiology on 11/06/2023.  They do recommend rhythm monitoring at some point. - Continue Eliquis 2.5 mg twice daily and metoprolol titrate 25 mg twice daily  MDD (major depressive disorder), recurrent, in full remission (HCC) PHQ-9 scores history of been stable and she is doing well on imipramine 50 mg twice daily.  She will continue this medication.  Type 2 diabetes mellitus, controlled (HCC) A1c today 8.0 and largely stable from prior.  She has seen ophthalmologist in the past year and will try to bring in or fax over records. - Continue glimepiride 1 mg daily and CBG checks    Patient discussed with Dr. Theodosia Paling, DO [facility] Internal Medicine Resident PGY-2 Clinic Phone: [phone] Pager: [phone]

## 2023-11-07 NOTE — Patient Instructions (Addendum)
  Thank you, [name], for allowing [place] to provide your care today.  I am glad that you have been doing well since leaving the hospital.  Your blood pressures today in the office are a little bit elevated but there is no significant drop when we checked your orthostatic blood pressures.  I am going to restart one of the medications we stopped during your hospitalization.  I am going to start amlodipine at 5 mg daily which is lower than what you were taking before.  If you have more dizziness whenever you restart this please stop taking it and call our clinic.  Please continue to take your blood pressure measurements at home and write these down and book.  We will plan for a telehealth visit in about 4 weeks to review your blood pressures and see if we need to increase the amlodipine or maybe start the valsartan again.  I am also sending in a refill for your prednisone that he can pick up whenever you need it.  If you do have to use this medication please let our clinic know.   Follow up: 2-3 months    Remember:     Should you have any questions or concerns please call the internal medicine clinic at [phone].     Rocky Morel, DO [facility]

## 2023-11-12 MED ORDER — AMLODIPINE BESYLATE 5 MG PO TABS: 5.0000 mg | ORAL_TABLET | Freq: Every day | ORAL | Status: DC

## 2023-11-12 NOTE — Assessment & Plan Note (Signed)
Today patient is in normal sinus rhythm on physical exam.  She has tolerated anticoagulation and rate control.  Followed up with cardiology on 11/06/2023.  They do recommend rhythm monitoring at some point. - Continue Eliquis 2.5 mg twice daily and metoprolol titrate 25 mg twice daily

## 2023-11-12 NOTE — Assessment & Plan Note (Signed)
PHQ-9 scores history of been stable and she is doing well on imipramine 50 mg twice daily.  She will continue this medication.

## 2023-11-12 NOTE — Assessment & Plan Note (Signed)
Patient presents for follow-up after new onset atrial flutter in the hospital and orthostatic hypotension.  During hospitalization she was started on metoprolol titrate 25 mg twice daily and due to her orthostatic hypotension amlodipine and valsartan were stopped.  Since discharge she has not had any orthostatic dizziness until this morning but attributes it to being more active this morning than most.  Orthostatic vital signs here in the office were negative.  Blood pressure here mildly elevated at 148/63. - Restart amlodipine 5 mg daily and continue metoprolol tartrate 25 mg twice daily

## 2023-11-12 NOTE — Assessment & Plan Note (Signed)
A1c today 8.0 and largely stable from prior.  She has seen ophthalmologist in the past year and will try to bring in or fax over records. - Continue glimepiride 1 mg daily and CBG checks

## 2023-11-14 NOTE — Progress Notes (Signed)
Internal Medicine Clinic Attending  Case discussed with the resident at the time of the visit.  We reviewed the resident's history and exam and pertinent patient test results.  I agree with the assessment, diagnosis, and plan of care documented in the resident's note.  

## 2023-11-15 ENCOUNTER — Other Ambulatory Visit: Payer: Self-pay | Admitting: Internal Medicine

## 2023-11-15 DIAGNOSIS — J302 Other seasonal allergic rhinitis: Secondary | ICD-10-CM

## 2023-11-17 NOTE — Telephone Encounter (Signed)
Medication sent to pharmacy  

## 2023-11-18 ENCOUNTER — Ambulatory Visit: Payer: Self-pay | Admitting: *Deleted

## 2023-11-18 NOTE — Patient Outreach (Signed)
  Care Coordination   11/18/2023 Name: Christine [name] MRN: [id number] DOB: September 01, 1933   Care Coordination Outreach Attempts:  An unsuccessful outreach was attempted for an appointment today.  Follow Up Plan:  Additional outreach attempts will be made to offer the patient complex care management information and services.   Encounter Outcome:  No Answer   Care Coordination Interventions:  No, not indicated    [name], RN, MSN, CCM [place]  [facility], [facility] RN Care Coordinator Direct Dial: [phone] / Main [phone] Fax [phone] Email: [email or web address] Website: [email or web address]

## 2023-12-01 ENCOUNTER — Ambulatory Visit: Payer: Self-pay | Admitting: *Deleted

## 2023-12-01 NOTE — Patient Instructions (Signed)
 Visit Information  Thank you for taking time to visit with me today. Please don't hesitate to contact me if I can be of assistance to you before our next scheduled telephone appointment.  Following are the goals we discussed today:  Monitor and record BP, HR, and glucose. Drink Glucerna instead of Ensure.   Our next appointment is by telephone on 2/11  Please call the care guide team at [phone] if you need to cancel or reschedule your appointment.   Please call the Suicide and Crisis Lifeline: 988 call the [place]  National Suicide Prevention Lifeline: 651-664-6026 or TTY: 616-795-7770 TTY 724-320-6364) to talk to a trained counselor call 1-800-273-TALK (toll free, 24 hour hotline) call 911 if you are experiencing a Mental Health or Behavioral Health Crisis or need someone to talk to.  Patient verbalizes understanding of instructions and care plan provided today and agrees to view in MyChart. Active MyChart status and patient understanding of how to access instructions and care plan via MyChart confirmed with patient.     The patient has been provided with contact information for the care management team and has been advised to call with any health related questions or concerns.   [name], RN, MSN, CCM [facility], [facility] RN Care Coordinator Direct Dial: [phone] / Main [phone] Fax [phone] Email: [email or web address] [email or web address]

## 2023-12-01 NOTE — Patient Outreach (Signed)
  Care Coordination   Follow Up Visit Note   12/01/2023 Name: Christine [name] MRN: [id number] DOB: 1933/10/21  Christine [name] is a 89 y.o. year old female who sees Trudy, Mliss Dragon, MD for primary care. I spoke with  [name] and daughter by phone today.  What matters to the patients health and wellness today?  Per daughter, patient was doing very well post discharge and medication changes, but was not feeling her best today.  Blood pressure elevated, will continue to monitor for potential upcoming changes after follow up appointments.  Denies any urgent concerns, encouraged to contact this care manager with questions.      Goals Addressed             This Visit's Progress    Effective management of chronic medical conditions (DM, HTH, A-fib)       Interventions Today    Flowsheet Row Most Recent Value  Chronic Disease   Chronic disease during today's visit Diabetes, Hypertension (HTN), Atrial Fibrillation (AFib)  General Interventions   General Interventions Discussed/Reviewed General Interventions Reviewed, Labs, Doctor Visits, Communication with, Durable Medical Equipment (DME)  Labs Hgb A1c every 3 months  [A1C increased to 8]  Doctor Visits Discussed/Reviewed Doctor Visits Reviewed, PCP, Specialist  [Daughter reminded of appointment with Cardiology on 2/10.  Also has telephone visit for BP follow up on 1/17, had to reschedule for 1/27]  Durable Medical Equipment (DME) Glucomoter, BP Cuff  [report blood sugar today was 128, blood pressure 167/77]  PCP/Specialist Visits Compliance with follow-up visit  Communication with PCP/Specialists  [Called PCP office to reschedule telephone visit as daughter will be out of town on Friday]  Education Interventions   Education Provided Provided Education, Provided Web-based Education  Provided Verbal Education On Blood Sugar Monitoring, Nutrition, Labs, Medication, When to see the doctor  [Off Diovan , Norvasc  decreased to 5mg , Plavix   replaced with Eliquis , BP becoming more elevated post discharge with these changes.  Daughter will discuss with MD for potential change in meds]  Labs Reviewed Hgb A1c  [elevated to 8, aware of goal less than 7]  Nutrition Interventions   Nutrition Discussed/Reviewed Nutrition Reviewed, Decreasing sugar intake, Portion sizes, Adding fruits and vegetables  [daughter does most of the cooking, will monitor sugars and starches better.  Drinking Ensure, encouraged to change to Glucerna]  Pharmacy Interventions   Pharmacy Dicussed/Reviewed Affording Medications, Pharmacy Topics Reviewed  Fredna need for patient assistance for Eliquis , daughter denies needing at this time]              SDOH assessments and interventions completed:  No     Care Coordination Interventions:  Yes, provided   Follow up plan: Follow up call scheduled for 2/11    Encounter Outcome:  Patient Visit Completed   Christine Ku, RN, MSN, CCM [place]  [facility], [facility] RN Care Coordinator Direct Dial: [phone] / Main [phone] Fax [phone] Email: [email or web address]: [email or web address]

## 2023-12-05 ENCOUNTER — Telehealth: Payer: Medicare Other | Admitting: Student

## 2023-12-09 ENCOUNTER — Other Ambulatory Visit (HOSPITAL_BASED_OUTPATIENT_CLINIC_OR_DEPARTMENT_OTHER): Payer: Self-pay | Admitting: Internal Medicine

## 2023-12-09 DIAGNOSIS — Z1231 Encounter for screening mammogram for malignant neoplasm of breast: Secondary | ICD-10-CM

## 2023-12-15 ENCOUNTER — Ambulatory Visit: Payer: Medicare Other | Admitting: Student

## 2023-12-15 ENCOUNTER — Inpatient Hospital Stay (HOSPITAL_BASED_OUTPATIENT_CLINIC_OR_DEPARTMENT_OTHER): Admission: RE | Admit: 2023-12-15 | Payer: Medicare Other | Source: Ambulatory Visit | Admitting: Radiology

## 2023-12-15 DIAGNOSIS — I4892 Unspecified atrial flutter: Secondary | ICD-10-CM | POA: Diagnosis not present

## 2023-12-15 DIAGNOSIS — I10 Essential (primary) hypertension: Secondary | ICD-10-CM

## 2023-12-15 DIAGNOSIS — Z79899 Other long term (current) drug therapy: Secondary | ICD-10-CM | POA: Diagnosis not present

## 2023-12-15 MED ORDER — AMLODIPINE BESYLATE 10 MG PO TABS: 10.0000 mg | ORAL_TABLET | Freq: Every day | ORAL | 3 refills | Status: AC

## 2023-12-15 NOTE — Assessment & Plan Note (Addendum)
Patient denies lightheadedness, dizziness, palpitations.  She does provide me a log of her home vitals, multiple bradycardic pulses noted ranging from 51-57.  Appears to be asymptomatic at this time.  Current regimen metoprolol 25 mg twice daily, Eliquis 2.5 mg twice daily.

## 2023-12-15 NOTE — Assessment & Plan Note (Addendum)
Patient previously hospitalized for new onset a flutter leading to weakness and dizziness.  At time of hospital discharge she was started on metoprolol 25 mg twice daily and her calcium channel blocker and ARB were discontinued.  Previously she was taking amlodipine 10 mg daily as well as valsartan 320 mg daily.  At last office visit amlodipine 5 mg daily was initiated. Today she states that her blood pressures at home have been running high.  Blood pressures ranging from 151/82 - 172/72.    Plan: Increase amlodipine to 10 mg daily,  Follow-up with Dr. Mayford Knife on February 11 at 9:15 AM.

## 2023-12-15 NOTE — Progress Notes (Unsigned)
I connected with  Christine [name] on 12/15/23 by telephone and verified that I am speaking with the correct person using two identifiers.   I discussed the limitations of evaluation and management by telemedicine. The patient expressed understanding and agreed to proceed.  CC: Hypertension follow-up  This is an 89 year old female with pertinent past medical history of hypertension, a flutter, type 2 diabetes, CHF, temporal arteritis, polymyalgia rheumatica, CKD 3A, and MDD presenting for telephone visit regarding blood pressure.  She was hospitalized at the beginning of December 2024 with new onset a flutter leading to weakness and dizziness.  At time of discharge blood pressure medications were held.  She had 1 hospital follow-up visit in late December where her amlodipine was resumed at 5 mg.  She is presenting today for additional blood pressure medication titrations.    This is a telephone encounter between Christine [name] and Christine [name] on 12/15/2023 for hypertension follow-up. The visit was conducted with the patient located at home and Christine [name] at [facility]. The patient's identity was confirmed using their DOB and current address. The patient has consented to being evaluated through a telephone encounter and understands the associated risks (an examination cannot be done and the patient may need to come in for an appointment) / benefits (allows the patient to remain at home, decreasing exposure to coronavirus). I personally spent 15 minutes on medical discussion.   HPI:  Christine [name] is a 89 y.o. with PMH as below.   Please see A&P for assessment of the patient's acute and chronic medical conditions.   Past Medical History:  Diagnosis Date   Anemia    Anginal pain (HCC)    Arthritis    "back, arms, hips; hands" (11/30/2015)   Benign hypertensive heart and kidney disease with diastolic CHF, NYHA class II and CKD stage III (HCC)    Blood transfusion 1972   "after  daughter born, attempted to give me blood; couldn't give it cause my blood was cold" (09/23/2013)   CAD (coronary artery disease)    a. LHC 08/23/11: dLM 40-50%, oRI 40%, oCFX 40%, oD1 70% (small and not amenable to PCI).  LM lesion did not appear to be flow limiting.  Medical rx was recommended;  b. Echo 08/23/11: mild LVH, EF 60-65%, grade 1 diast dysfxn, mild BAE, PASP 24;  c. 02/2012  Cath: LM 50d, LAD 50p, D1 70ost, RI 70p, RCA ok->Med Rx;  d. 01/2013 Cardiolite: EF 88, no ischemia/infarct.   Carotid stenosis    a. dopplers 10/12:  0-39% bilat ICA;  b. 10/2012 U/S: 0-39% bilat, f/u 1 yr (10/2013).   Chronic bronchitis    Chronotropic incompetence 05/01/2021   Depression    Diverticulosis of colon (without mention of hemorrhage)    GERD (gastroesophageal reflux disease)    Heart murmur, systolic    2-D echo in December 2011 showed a normal EF with grade 1 diastolic dysfunction, trivial pulmonary regurgitation and mildly elevated PA pressure at 37 mmHg probably secondary to her COPD.dynamic obstruction-mid cavity obliteration;  b. 02/2012 Echo: EF 60-65%, mild LVH, PASP .   History of stomach ulcers 1970's   Hyperlipidemia    Hypertension    Hypothyroidism    Migraines    a. Next atypical symptoms in the past. Patient was started on Neurontin for possible neuropathic origin of her pain.   Polymyalgia rheumatica (HCC)    Premature atrial contraction 05/01/2021   She remains asymptomatic.  No medication indicated.   Shortness  of breath on exertion    "just related to angina >1 yr ago" (09/23/2013)   Sinus arrhythmia    Solitary pulmonary nodule 12/15/2006   CT Chest in January 2008  IMPRESSION:  1. Stable CT of the chest with mild biapical scarring and scattered nodularity, most likely postinflammatory in the absence of a history of malignancy.  2. No acute chest findings are demonstrated.  3. Unless the patient has a history of malignancy or risk factors for lung cancer, the chest findings do  not necessarily require any specific followup. If follow    Type II diabetes mellitus (HCC)    a. On oral hypoglycemic agents.    Assessment & Plan:   Atrial flutter (HCC) Patient denies lightheadedness, dizziness, palpitations.  She does provide me a log of her home vitals, multiple bradycardic pulses noted ranging from 51-57.  Appears to be asymptomatic at this time.  Current regimen metoprolol 25 mg twice daily, Eliquis 2.5 mg twice daily.  Essential hypertension Patient previously hospitalized for new onset a flutter leading to weakness and dizziness.  At time of hospital discharge she was started on metoprolol 25 mg twice daily and her calcium channel blocker and ARB were discontinued.  Previously she was taking amlodipine 10 mg daily as well as valsartan 320 mg daily.  At last office visit amlodipine 5 mg daily was initiated. Today she states that her blood pressures at home have been running high.  Blood pressures ranging from 151/82 - 172/72.    Plan: Increase amlodipine to 10 mg daily,  Follow-up with Dr. Mayford [name] on February 11 at 9:15 AM.   Polypharmacy Patient has a history of polymyalgia rheumatica and temporal arteritis for which she takes prednisone as needed.  She states she has not taken this medication since leaving hospital.  She is also on a TCA for MDD.  Patient lives at home with her daughter, who helps her.  Patient states she is independent with her medications and many of her ADLs.  She states aside her medications at the beginning of the month using a pill organizer and takes them as needed.  She feels that she is diligent with her medications.  Plan: Patient to see Dr. Mayford [name] in the geriatrics clinic, appointment made Discontinue prednisone in meantime Will continue to simplify/update medicine regiment    Patient discussed with Dr. Willow Ora MD [facility] Internal Medicine  PGY-1 Pager: [phone]  Phone: [phone] Date  12/15/2023  Time 11:19 PM

## 2023-12-15 NOTE — Assessment & Plan Note (Signed)
Patient has a history of polymyalgia rheumatica and temporal arteritis for which she takes prednisone as needed.  She states she has not taken this medication since leaving hospital.  She is also on a TCA for MDD.  Patient lives at home with her daughter, who helps her.  Patient states she is independent with her medications and many of her ADLs.  She states aside her medications at the beginning of the month using a pill organizer and takes them as needed.  She feels that she is diligent with her medications.  Plan: Patient to see Dr. Mayford Knife in the geriatrics clinic, appointment made Discontinue prednisone in meantime Will continue to simplify/update medicine regiment

## 2023-12-20 NOTE — Progress Notes (Signed)
 Internal Medicine Clinic Attending  Case discussed with the resident at the time of the visit.  We reviewed the resident's history and exam and pertinent patient test results.  I agree with the assessment, diagnosis, and plan of care documented in the resident's note.

## 2023-12-22 ENCOUNTER — Ambulatory Visit (HOSPITAL_BASED_OUTPATIENT_CLINIC_OR_DEPARTMENT_OTHER): Payer: Medicare Other | Admitting: Radiology

## 2023-12-29 ENCOUNTER — Encounter (HOSPITAL_BASED_OUTPATIENT_CLINIC_OR_DEPARTMENT_OTHER): Payer: Self-pay | Admitting: Family

## 2023-12-29 ENCOUNTER — Ambulatory Visit (INDEPENDENT_AMBULATORY_CARE_PROVIDER_SITE_OTHER): Payer: Medicare Other | Admitting: Family

## 2023-12-29 VITALS — BP 134/66 | HR 88 | Ht 64.0 in | Wt 131.0 lb

## 2023-12-29 DIAGNOSIS — E785 Hyperlipidemia, unspecified: Secondary | ICD-10-CM | POA: Diagnosis not present

## 2023-12-29 DIAGNOSIS — I10 Essential (primary) hypertension: Secondary | ICD-10-CM | POA: Diagnosis not present

## 2023-12-29 DIAGNOSIS — D6859 Other primary thrombophilia: Secondary | ICD-10-CM

## 2023-12-29 DIAGNOSIS — I4892 Unspecified atrial flutter: Secondary | ICD-10-CM

## 2023-12-29 DIAGNOSIS — I25118 Atherosclerotic heart disease of native coronary artery with other forms of angina pectoris: Secondary | ICD-10-CM | POA: Diagnosis not present

## 2023-12-29 NOTE — Patient Instructions (Signed)
 Medication Instructions:  Your physician recommends that you continue on your current medications as directed. Please refer to the Current Medication list given to you today.  Follow-Up: At [facility], you and your health needs are our priority.  As part of our continuing mission to provide you with exceptional heart care, we have created designated Provider Care Teams.  These Care Teams include your primary Cardiologist (physician) and Advanced Practice Providers (APPs -  Physician Assistants and Nurse Practitioners) who all work together to provide you with the care you need, when you need it.  We recommend signing up for the patient portal called "MyChart".  Sign up information is provided on this After Visit Summary.  MyChart is used to connect with patients for Virtual Visits (Telemedicine).  Patients are able to view lab/test results, encounter notes, upcoming appointments, etc.  Non-urgent messages can be sent to your provider as well.   To learn more about what you can do with MyChart, go to [email or web address].    Your next appointment:   Follow up in Sept or Oct with Dr. Theodis Fiscal or Neomi Banks, NP   Other Instructions BP goal is less than 140/90

## 2023-12-29 NOTE — Progress Notes (Signed)
 Cardiology Office Note:  .   Date:  12/29/2023  ID:  Christine [name], DOB 05-14-1933, MRN [id number] PCP: Sherol Dixie, MD  [facility] Providers Cardiologist:  Maudine Sos, MD    History of Present Illness: Christine [name]   Christine [name] is a 89 y.o. female with history of atrial flutter, orthostatic hypotension, hypertension, GCA, PMR, CAD, COPD, hyperlipidemia, hypothyroidism, GERD, MDD.  Prior LHC 20 1840% left main stenosis, 70% ostial D1 stenosis.  Recommended for medical management.  Admitted 12/9 - 10/29/2023 with new onset atrial flutter, orthostatic hypotension.  Started on Eliquis  2.5 mg twice daily.  Echo LVEF 70 to 75%, no RWMA, no RV dysfunction.  She was started metoprolol  titrate 25 mg twice daily for rate control.  Due to chronic steroid use cortisol checked, normal adrenal function.  Amlodipine  and losartan were stopped.  Plavix  was stopped as started on Eliquis .  She was seen by A-fib clinic 11/06/2023 and found to be in NSR.  She was encouraged to purchase Kardia. Amlodipine  was later resumed.   Presents today for follow up with her daughter. Notes one missed dose of medication Saturday as she was uncertain if she had taken her medications that day. Has since put her Metoprolol  and Eliquis  in pill box so it does not recur. She did feel a bit dizzy after missed dose but has not recurred. BP at home 140-160s. Physical activity limited by back pain.   ROS: Please see the history of present illness.    All other systems reviewed and are negative.   Studies Reviewed: .        Cardiac Studies & Procedures   CARDIAC CATHETERIZATION  CARDIAC CATHETERIZATION 08/12/2017  Narrative  Ost LM lesion, 40 %stenosed.  LM lesion, 40 %stenosed.  Ost 1st Diag to 1st Diag lesion, 70 %stenosed.  Prox RCA to Mid RCA lesion, 10 %stenosed.  The left ventricular systolic function is normal.  LV end diastolic pressure is normal.  The left ventricular ejection fraction is  55-65% by visual estimate.  LV end diastolic pressure is normal.  1. Nonobstructive CAD. Unchanged from 2013 and 2016. 2. Normal LV function 3. Normal LV filling pressures 4. Normal right heart pressures  Plan: continue medical therapy. Based on these results I don't think her symptoms of fatigue and dyspnea are cardiac related.  Findings Coronary Findings Diagnostic  Dominance: Right  Left Main  Left Anterior Descending  First Diagonal Branch Vessel is small in size.  Right Coronary Artery  Intervention  No interventions have been documented.   CARDIAC CATHETERIZATION  CARDIAC CATHETERIZATION 09/26/2015  Narrative 1. Low filling pressures, no evidence for CHF as cause of her dyspnea. 2. Stable coronary disease.  The 40-50% left main stenosis and the 70-80% D1 stenosis do not appear to have progressed compared to 2013 angiography. 3. I cannot explain her exertional symptoms on the basis of this procedure.  Would consider CPX.  Findings Coronary Findings Diagnostic  Dominance: Right  Left Main 40-50% distal LM stenosis.  Left Anterior Descending Luminal irregularities.  70-80% ostial D1(small to moderate vessel).  Left Circumflex 30% ostial LCx.  Right Coronary Artery Luminal irregularities.  Intervention  No interventions have been documented.   STRESS TESTS  MYOCARDIAL PERFUSION IMAGING 09/05/2015  Narrative  Nuclear stress EF: 82%.  There was no ST segment deviation noted during stress.  The study is normal.  This is a low risk study.  The left ventricular ejection fraction is hyperdynamic (>65%).  Normal pharmacologic  stress test with no infarct and no ischemia.  ECHOCARDIOGRAM  ECHOCARDIOGRAM COMPLETE 10/28/2023  Narrative ECHOCARDIOGRAM REPORT    Patient Name:   Christine [name] Date of Exam: 10/28/2023 Medical Rec #:  [id number]      Height:       64.0 in Accession #:    [id number]     Weight:       129.6 lb Date of Birth:   1933/03/24       BSA:          1.627 m Patient Age:    89 years       BP:           128/72 mmHg Patient Gender: F              HR:           102 bpm. Exam Location:  Inpatient  Procedure: 2D Echo, Color Doppler and Cardiac Doppler  Indications:    I48.92* Unspecified atrial flutter  History:        Patient has prior history of Echocardiogram examinations, most recent 11/07/2017. Risk Factors:Hypertension, Diabetes and Dyslipidemia.  Sonographer:    Sherline Distel Senior RDCS Referring Phys: [id number] ERIK C HOFFMAN   Sonographer Comments: Some windows limited by thin body habitus IMPRESSIONS   1. Left ventricular ejection fraction, by estimation, is 70 to 75%. Left ventricular ejection fraction by PLAX is 73 %. The left ventricle has hyperdynamic function. The left ventricle has no regional wall motion abnormalities. There is mild concentric left ventricular hypertrophy. Left ventricular diastolic function could not be evaluated. 2. Right ventricular systolic function is normal. The right ventricular size is not well visualized. There is normal pulmonary artery systolic pressure. The estimated right ventricular systolic pressure is 24.5 mmHg. 3. Left atrial size was mildly dilated. 4. Right atrial size was mildly dilated. 5. The mitral valve is abnormal. Trivial mitral valve regurgitation. 6. The aortic valve has an indeterminant number of cusps. Aortic valve regurgitation is not visualized. Aortic valve sclerosis is present, with no evidence of aortic valve stenosis. 7. The inferior vena cava is normal in size with greater than 50% respiratory variability, suggesting right atrial pressure of 3 mmHg.  FINDINGS Left Ventricle: Left ventricular ejection fraction, by estimation, is 70 to 75%. Left ventricular ejection fraction by PLAX is 73 %. The left ventricle has hyperdynamic function. The left ventricle has no regional wall motion abnormalities. The left ventricular internal cavity size was normal  in size. There is mild concentric left ventricular hypertrophy. Left ventricular diastolic function could not be evaluated due to atrial fibrillation. Left ventricular diastolic function could not be evaluated.  Right Ventricle: The right ventricular size is not well visualized. No increase in right ventricular wall thickness. Right ventricular systolic function is normal. There is normal pulmonary artery systolic pressure. The tricuspid regurgitant velocity is 2.32 m/s, and with an assumed right atrial pressure of 3 mmHg, the estimated right ventricular systolic pressure is 24.5 mmHg.  Left Atrium: Left atrial size was mildly dilated.  Right Atrium: Right atrial size was mildly dilated.  Pericardium: There is no evidence of pericardial effusion. Presence of epicardial fat layer.  Mitral Valve: The mitral valve is abnormal. There is mild thickening of the mitral valve leaflet(s). There is mild calcification of the mitral valve leaflet(s). Trivial mitral valve regurgitation.  Tricuspid Valve: The tricuspid valve is normal in structure. Tricuspid valve regurgitation is trivial.  Aortic Valve: The aortic valve has an indeterminant number of cusps.  Aortic valve regurgitation is not visualized. Aortic valve sclerosis is present, with no evidence of aortic valve stenosis.  Pulmonic Valve: The pulmonic valve was normal in structure. Pulmonic valve regurgitation is not visualized.  Aorta: The aortic root and ascending aorta are structurally normal, with no evidence of dilitation.  Venous: The inferior vena cava is normal in size with greater than 50% respiratory variability, suggesting right atrial pressure of 3 mmHg.  IAS/Shunts: The atrial septum is grossly normal.   LEFT VENTRICLE PLAX 2D LV EF:         Left ventricular ejection fraction by PLAX is 73 %. LVIDd:         2.70 cm LVIDs:         1.60 cm LV PW:         1.10 cm LV IVS:        1.10 cm LVOT diam:     1.80 cm LV SV:          38 LV SV Index:   23 LVOT Area:     2.54 cm   RIGHT VENTRICLE RV S prime:     13.40 cm/s TAPSE (M-mode): 1.7 cm  LEFT ATRIUM             Index        RIGHT ATRIUM           Index LA diam:        2.90 cm 1.78 cm/m   RA Area:     19.50 cm LA Vol (A2C):   68.3 ml 41.98 ml/m  RA Volume:   51.40 ml  31.59 ml/m LA Vol (A4C):   49.2 ml 30.27 ml/m LA Biplane Vol: 62.7 ml 38.53 ml/m AORTIC VALVE LVOT Vmax:   89.44 cm/s LVOT Vmean:  65.400 cm/s LVOT VTI:    0.148 m  AORTA Ao Root diam: 2.20 cm Ao Asc diam:  2.30 cm  TRICUSPID VALVE TR Peak grad:   21.5 mmHg TR Vmax:        232.00 cm/s  SHUNTS Systemic VTI:  0.15 m Systemic Diam: 1.80 cm  Karyl Paget Croitoru MD Electronically signed by Luana Rumple MD Signature Date/Time: 10/28/2023/3:42:42 PM    Final             Risk Assessment/Calculations:    CHA2DS2-VASc Score = 6   This indicates a 9.7% annual risk of stroke. The patient's score is based upon: CHF History: 0 HTN History: 1 Diabetes History: 1 Stroke History: 0 Vascular Disease History: 1 Age Score: 2 Gender Score: 1            Physical Exam:   VS:  BP 134/66   Pulse 88   Ht 5\' 4"  (1.626 m)   Wt 131 lb (59.4 kg)   LMP 02/11/1975 (Approximate)   SpO2 98%   BMI 22.49 kg/m    Wt Readings from Last 3 Encounters:  12/29/23 131 lb (59.4 kg)  11/07/23 133 lb 12.8 oz (60.7 kg)  11/06/23 133 lb (60.3 kg)    GEN: Well nourished, well developed in no acute distress NECK: No JVD; No carotid bruits CARDIAC:RRR, no murmurs, rubs, gallops RESPIRATORY:  Clear to auscultation without rales, wheezing or rhonchi  ABDOMEN: Soft, non-tender, non-distended EXTREMITIES:  No edema; No deformity   ASSESSMENT AND PLAN: .    Atrial flutter/hypercoagulable state- RRR by auscultation. Denies palpitations. Continue Lopressor  25mg  BID, Eliquis  2.5mg  BID (reduced dose due to age, body habitus). Denies bleeding complications. Follows with AFib Clinic.  CHA2DS2-VASc Score =  6 [CHF History: 0, HTN History: 1, Diabetes History: 1, Stroke History: 0, Vascular Disease History: 1, Age Score: 2, Gender Score: 1].  Therefore, the patient's annual risk of stroke is 9.7 %.      Orthostatic hypotension/hypertension- No recurrent orthostasis symptoms. BP controlled in clinic and elevated at home. Reasonable BP goal <140/90. Continue Amlodipine  10mg  daily, Lopressor  25mg  BID. Discussed checking at home at least an hour after medications and after sitting and resting for 5-10 minutes.   CAD/HLD, LDL goal less than 70- Stable with no anginal symptoms. No indication for ischemic evaluation.  GDMT Lopressor  25mg  BID, Atorvastatin . No ASA due to OAC.       Dispo: follow up 07/2024 or 08/2024 with Dr. Theodis Fiscal  Signed, Clearnce Curia, NP

## 2023-12-30 ENCOUNTER — Ambulatory Visit: Payer: Self-pay | Admitting: *Deleted

## 2023-12-30 ENCOUNTER — Other Ambulatory Visit: Payer: Self-pay

## 2023-12-30 ENCOUNTER — Ambulatory Visit: Payer: Medicare Other | Admitting: Internal Medicine

## 2023-12-30 VITALS — BP 132/66 | HR 75 | Temp 98.0°F | Resp 28 | Ht 64.0 in | Wt 130.6 lb

## 2023-12-30 DIAGNOSIS — F3342 Major depressive disorder, recurrent, in full remission: Secondary | ICD-10-CM | POA: Diagnosis not present

## 2023-12-30 DIAGNOSIS — M8580 Other specified disorders of bone density and structure, unspecified site: Secondary | ICD-10-CM | POA: Diagnosis not present

## 2023-12-30 DIAGNOSIS — E119 Type 2 diabetes mellitus without complications: Secondary | ICD-10-CM | POA: Diagnosis not present

## 2023-12-30 DIAGNOSIS — I10 Essential (primary) hypertension: Secondary | ICD-10-CM

## 2023-12-30 DIAGNOSIS — I483 Typical atrial flutter: Secondary | ICD-10-CM | POA: Diagnosis not present

## 2023-12-30 DIAGNOSIS — M316 Other giant cell arteritis: Secondary | ICD-10-CM

## 2023-12-30 DIAGNOSIS — G43809 Other migraine, not intractable, without status migrainosus: Secondary | ICD-10-CM | POA: Diagnosis not present

## 2023-12-30 DIAGNOSIS — R4189 Other symptoms and signs involving cognitive functions and awareness: Secondary | ICD-10-CM

## 2023-12-30 MED ORDER — METOPROLOL TARTRATE 25 MG PO TABS: 25.0000 mg | ORAL_TABLET | Freq: Two times a day (BID) | ORAL | 6 refills | Status: DC

## 2023-12-30 NOTE — Patient Instructions (Addendum)
Thank you, Ms.Agata R Canevari for allowing [place] to provide your care today. Today we discussed your medical history, your current functional status at home, and your current medical conditions.  We will not make changes to your medications today.   Important: - Ear impaction; please follow up with your ear specialist. It will need manual disimpaction - Blood work for diabetes, kidney function, and bone health next appointment - We will make suggestions to optimize your safety and keep you strong   Please follow-up in: 6-8 weeks     We look forward to seeing you next time. Please call our clinic at [phone] if you have any questions or concerns. The best time to call is Monday-Friday from 9am-4pm, but there is someone available 24/7. If after hours or the weekend, call the main hospital number and ask for the Internal Medicine Resident On-Call. If you need medication refills, please notify your pharmacy one week in advance and they will send [place] a request.   Thank you for letting us take part in your care. Wishing you the best!  Drs. Mayford Knife and [facility]

## 2023-12-30 NOTE — Patient Instructions (Signed)
Visit Information  Thank you for taking time to visit with me today. Please don't hesitate to contact me if I can be of assistance to you before our next scheduled telephone appointment.  Following are the goals we discussed today:  Follow up with specialist regarding ear impaction.  Continue current medication regime. Continue monitoring HR, BP and blood sugar daily.  Our next appointment is by telephone on 3/11  Please call the care guide team at [phone] if you need to cancel or reschedule your appointment.   Please call the Suicide and Crisis Lifeline: 988 call the [place] National Suicide Prevention Lifeline: (216)493-3215 or TTY: 808-071-2935 TTY 220-773-8011) to talk to a trained counselor call 1-800-273-TALK (toll free, 24 hour hotline) call 911 if you are experiencing a Mental Health or Behavioral Health Crisis or need someone to talk to.  Patient verbalizes understanding of instructions and care plan provided today and agrees to view in MyChart. Active MyChart status and patient understanding of how to access instructions and care plan via MyChart confirmed with patient.     The patient has been provided with contact information for the care management team and has been advised to call with any health related questions or concerns.   Rodney Langton, RN, MSN, CCM [facility], [facility] RN Care Coordinator Direct Dial: [phone] / Main [phone] Fax [phone] Email: [email or web address]: [email or web address]

## 2023-12-30 NOTE — Patient Outreach (Signed)
  Care Coordination   Follow Up Visit Note   12/30/2023 Name: TANISE RUSSMAN MRN: [id number] DOB: 1933-10-15  KEAMBER MACFADDEN is a 89 y.o. year old female who sees Mayford Knife, Dorene Ar, MD for primary care. I engaged with [name] and daughter on phone today.   What matters to the patients health and wellness today?  Patient report she is doing well, per daughter patient was having a hard time hearing even with hearing aide.  Was seen by PCP office today, per note, will need intervention for ear impaction.  Otherwise, patient doing well.  Denies any urgent concerns, encouraged to contact this care manager with questions.      Goals Addressed             This Visit's Progress    Effective management of chronic medical conditions (DM, HTH, A-fib)   On track    Interventions Today    Flowsheet Row Most Recent Value  Chronic Disease   Chronic disease during today's visit Diabetes, Hypertension (HTN), Other  [Ear impaction]  General Interventions   General Interventions Discussed/Reviewed General Interventions Reviewed, Doctor Visits, Durable Medical Equipment (DME)  Doctor Visits Discussed/Reviewed Doctor Visits Reviewed, PCP, Specialist  [Upcoming reviewed: PCP follow up on 4/3]  Durable Medical Equipment (DME) BP Cuff, Glucomoter  PCP/Specialist Visits Compliance with follow-up visit  [PCP visit today for ear/hearing issues]  Education Interventions   Education Provided Provided Education  Provided Verbal Education On Blood Sugar Monitoring, Medication, When to see the doctor, Eye Care, Foot Care  [facility] reviewed, daughter monitoring BP and blood sugar daily, report numbers are much better and more stable]              SDOH assessments and interventions completed:  No     Care Coordination Interventions:  Yes, provided   Follow up plan: Follow up call scheduled for 3/11    Encounter Outcome:  Patient Visit Completed   Rodney Langton, RN, MSN, CCM [place]   [facility], [facility] RN Care Coordinator Direct Dial: [phone] / Main [phone] Fax [phone] Email: [email or web address]

## 2024-01-26 ENCOUNTER — Other Ambulatory Visit: Payer: Self-pay | Admitting: Internal Medicine

## 2024-01-26 DIAGNOSIS — E119 Type 2 diabetes mellitus without complications: Secondary | ICD-10-CM

## 2024-01-26 NOTE — Telephone Encounter (Signed)
 Medication sent to pharmacy

## 2024-01-27 ENCOUNTER — Ambulatory Visit: Payer: Self-pay | Admitting: *Deleted

## 2024-01-27 NOTE — Patient Outreach (Signed)
 Care Coordination   01/27/2024 Name: Christine [name] MRN: [id number] DOB: Sep 06, 1933   Care Coordination Outreach Attempts:  An unsuccessful outreach was attempted for an appointment today.  Follow Up Plan:  Additional outreach attempts will be made to offer the patient complex care management information and services.   Encounter Outcome:  No Answer   Care Coordination Interventions:  No, not indicated    Rodney Langton, RN, MSN, CCM [place]  [facility], [facility] RN Care Coordinator Direct Dial: [phone] / Main [phone] Fax [phone] Email: [email or web address]: [email or web address]

## 2024-02-01 ENCOUNTER — Encounter: Payer: Self-pay | Admitting: *Deleted

## 2024-02-01 DIAGNOSIS — R4189 Other symptoms and signs involving cognitive functions and awareness: Secondary | ICD-10-CM | POA: Insufficient documentation

## 2024-02-01 MED ORDER — METOPROLOL TARTRATE 25 MG PO TABS: 25.0000 mg | ORAL_TABLET | Freq: Two times a day (BID) | ORAL | 3 refills | Status: AC

## 2024-02-01 NOTE — Assessment & Plan Note (Signed)
 Abnormal screening mini-cog - will follow with [facility] and further evaluate at next visit.

## 2024-02-01 NOTE — Assessment & Plan Note (Signed)
 132/66 on amlodipine 10 mg, metoprolol 25 mg bid. Remains OFF valsartan. No changes needed today.

## 2024-02-01 NOTE — Assessment & Plan Note (Signed)
 See entry for depression.

## 2024-02-01 NOTE — Progress Notes (Signed)
 89 y.o. Christine [name] is here for routine follow-up of HTN, COPD, CAD, hypothyroidism, giant cell arteritis/PMR, and new onset a flutter who was hospitalized from 12/9-09/2023 for orthostatic hypotension and new onset atrial flutter. She has had a hospital f/u visit here in [facility] [address] since then, and today I am meeting her for the first time as her new PCP (former PCP has departed the practice) to review her history.   Patient Active Problem List   Diagnosis Date Noted   Polypharmacy 12/15/2023   Hypercoagulable state due to paroxysmal atrial fibrillation (HCC) 11/06/2023   Typical atrial flutter (HCC) 10/28/2023   Atrial flutter (HCC) 10/28/2023   Primary osteoarthritis of both hands 05/08/2022   Gait instability 09/24/2020   Coronary artery disease involving native coronary artery of native heart 06/23/2020   Cervical radiculopathy at C7 06/23/2020   Chronic diastolic (congestive) heart failure (HCC) 01/13/2019   CKD stage 3 due to type 2 diabetes mellitus (HCC) 01/13/2019   Rectal prolapse 06/24/2018   Temporal arteritis (HCC) 04/06/2018   Rheumatic disease 07/29/2017   MDD (major depressive disorder), recurrent, in full remission (HCC) 11/30/2016   Elevated alkaline phosphatase level 09/18/2016   Hypertensive retinopathy of both eyes, grade 2 04/20/2015   Lumbar spondylosis 06/16/2014   Carpal tunnel syndrome 02/18/2014   Osteopenia 01/11/2013   Routine adult health maintenance 01/11/2013   Diverticulosis 09/18/2012   Fatigue 02/26/2012   Allergic rhinitis 02/13/2012   Hyperlipidemia 11/28/2006   Hypothyroidism 09/04/2006   Type 2 diabetes mellitus, controlled (HCC) 09/04/2006   Migraine 09/04/2006   Essential hypertension 09/04/2006   GERD 09/04/2006    Current Outpatient Medications:    acetaminophen (TYLENOL) 500 MG tablet, Take 1,000 mg by mouth as needed for headache. For pain, Disp: , Rfl:    amLODipine (NORVASC) 10 MG tablet, Take 1 tablet (10 mg total) by mouth daily.,  Disp: 90 tablet, Rfl: 3   apixaban (ELIQUIS) 2.5 MG TABS tablet, Take 1 tablet (2.5 mg total) by mouth 2 (two) times daily., Disp: 60 tablet, Rfl: 6   Ascorbic Acid (VITAMIN C PO), Take 2 tablets by mouth daily. , Disp: , Rfl:    atorvastatin (LIPITOR) 40 MG tablet, TAKE 1/2 TABLET BY MOUTH EVERY EVENING, Disp: 45 tablet, Rfl: 3   Blood Glucose Monitoring Suppl (ONETOUCH VERIO REFLECT) w/Device KIT, Check blood sugar one time daily before breakfast, Disp: 1 kit, Rfl: 1   cholecalciferol 25 MCG (1000 UT) tablet, Take 1,000 Units by mouth daily., Disp: , Rfl:    famotidine (PEPCID) 20 MG tablet, TAKE 1 TABLET(20 MG) BY MOUTH TWICE DAILY. FOLLOW UP FOR FURTHER REFILLS. THANK YOU, Disp: 180 tablet, Rfl: 2   fluticasone (FLONASE) 50 MCG/ACT nasal spray, SHAKE LIQUID AND USE 1 SPRAY IN EACH NOSTRIL EVERY DAY, Disp: 16 g, Rfl: 2   fluticasone-salmeterol (WIXELA INHUB) 250-50 MCG/ACT AEPB, Inhale 1 puff into the lungs in the morning and at bedtime., Disp: 60 each, Rfl: 3   glimepiride (AMARYL) 1 MG tablet, TAKE 1 TABLET BY MOUTH EVERY DAY BEFORE BREAKFAST, Disp: 90 tablet, Rfl: 3   glucose blood (ONETOUCH VERIO) test strip, USE TO CHECK BLOOD SUGAR ONCE DAILY BEFORE BREAKFAST, Disp: 100 strip, Rfl: 2   imipramine (TOFRANIL) 50 MG tablet, TAKE 1 TABLET(50 MG) BY MOUTH TWICE DAILY (Patient taking differently: Take 50 mg by mouth 2 (two) times daily.), Disp: 180 tablet, Rfl: 3   Lancet Devices (ONETOUCH DELICA PLUS LANCING) MISC, Check blood sugar one time daily before breakfast,  Disp: 100 each, Rfl: 5   Lancets (ONETOUCH DELICA PLUS LANCET33G) MISC, USE TO TEST ONCE DAILY, Disp: 100 each, Rfl: 11   levothyroxine (SYNTHROID) 25 MCG tablet, TAKE 1 TABLET BY MOUTH EVERY DAY, Disp: 90 tablet, Rfl: 1   loratadine (CLARITIN) 10 MG tablet, Take 10 mg by mouth every evening., Disp: , Rfl:    metoprolol tartrate (LOPRESSOR) 25 MG tablet, Take 1 tablet (25 mg total) by mouth 2 (two) times daily., Disp: 180 tablet, Rfl:  3   Multiple Vitamin (MULTIVITAMIN WITH MINERALS) TABS tablet, Take 1 tablet by mouth daily., Disp: 30 tablet, Rfl: 2  Objective  BP 132/66 (BP Location: Right Arm, Patient Position: Sitting, Cuff Size: Normal)   Pulse 75   Temp 98 F (36.7 C) (Oral)   Resp (!) 28   Ht 5\' 4"  (1.626 m)   Wt 130 lb 9.6 oz (59.2 kg)   LMP 02/11/1975 (Approximate)   SpO2 99% Comment: room air  BMI 22.42 kg/m   Exam: cerumen-impacted ear canals.  MiniCog screen:   abnormal  Assessment and Plan:  Controlled type 2 diabetes mellitus without complication, without long-term current use of insulin (HCC) Assessment & Plan: A1c 8 is certainly acceptable in this 89 yo lady (no benefit for tighter control) though glimepiride isn't safe in this age group given risk of hypoglycemia.  She has taken this for a long time; it, along with other medication changes with respect to safety, will be changed at next visit after we've had a chance to further discuss recommendations. Will review chart to determine whether her DM is steroid-associated.   Essential hypertension Assessment & Plan: 132/66 on amlodipine 10 mg, metoprolol 25 mg bid. Remains OFF valsartan. No changes needed today. Orders: -     Metoprolol Tartrate; Take 1 tablet (25 mg total) by mouth 2 (two) times daily.  Dispense: 180 tablet; Refill: 3  Typical atrial flutter (HCC) Assessment & Plan: Auscultates regular today.  Placed on apixaban 2.5 mg bid for stroke risk reduction, though her risk of mortality due to advanced age is greater than that contributed to by possible stroke.  No falls fortunately. Orders: -     Metoprolol Tartrate; Take 1 tablet (25 mg total) by mouth 2 (two) times daily.  Dispense: 180 tablet; Refill: 3  Temporal arteritis (HCC) Assessment & Plan: No formal diagnosis; she was recently taking steroids prescribed empirically for headache.  We'll explore this in more detail should headache recur.    MDD (major depressive disorder),  recurrent, in full remission (HCC) Assessment & Plan: While mood has been stable, TCA is suboptimal therapy due to risk of adverse effects (cognitive impairment, poor balance and falls, dry mouth, constipation) in older adults.  This has been a long standing medication and any changes will need to occur following discussion of safer options and shared decision making.   Other migraine without status migrainosus, not intractable Assessment & Plan: See entry for depression.    Osteopenia, unspecified location Assessment & Plan: DEXA 2018; overdue for vit D level. Will catch at next visit.       No follow-ups on file.

## 2024-02-01 NOTE — Assessment & Plan Note (Signed)
 Auscultates regular today.  Placed on apixaban 2.5 mg bid for stroke risk reduction, though her risk of mortality due to advanced age is greater than that contributed to by possible stroke.  No falls fortunately.

## 2024-02-01 NOTE — Assessment & Plan Note (Signed)
>>  ASSESSMENT AND PLAN FOR TYPICAL ATRIAL FLUTTER (HCC) WRITTEN ON 02/01/2024  8:26 PM BY Miguel Aschoff, MD  Auscultates regular today.  Placed on apixaban 2.5 mg bid for stroke risk reduction, though her risk of mortality due to advanced age is greater than that contributed to by possible stroke.  No falls fortunately.

## 2024-02-01 NOTE — Assessment & Plan Note (Signed)
 While mood has been stable, TCA is suboptimal therapy due to risk of adverse effects (cognitive impairment, poor balance and falls, dry mouth, constipation) in older adults.  This has been a long standing medication and any changes will need to occur following discussion of safer options and shared decision making.

## 2024-02-01 NOTE — Assessment & Plan Note (Addendum)
 A1c 8 is certainly acceptable in this 89 yo lady (no benefit for tighter control) though glimepiride isn't safe in this age group given risk of hypoglycemia.  She has taken this for a long time; it, along with other medication changes with respect to safety, will be changed at next visit after we've had a chance to further discuss recommendations. Will review chart to determine whether her DM is steroid-associated.

## 2024-02-01 NOTE — Assessment & Plan Note (Signed)
 No formal diagnosis; she was recently taking steroids prescribed empirically for headache.  We'll explore this in more detail should headache recur.

## 2024-02-01 NOTE — Assessment & Plan Note (Signed)
 DEXA 2018; overdue for vit D level. Will catch at next visit.

## 2024-02-17 ENCOUNTER — Ambulatory Visit: Admitting: Internal Medicine

## 2024-02-17 ENCOUNTER — Encounter: Payer: Self-pay | Admitting: Internal Medicine

## 2024-02-17 VITALS — BP 131/62 | HR 59 | Temp 98.0°F | Ht 64.0 in | Wt 132.7 lb

## 2024-02-17 DIAGNOSIS — N183 Chronic kidney disease, stage 3 unspecified: Secondary | ICD-10-CM

## 2024-02-17 DIAGNOSIS — R748 Abnormal levels of other serum enzymes: Secondary | ICD-10-CM | POA: Diagnosis not present

## 2024-02-17 DIAGNOSIS — M8580 Other specified disorders of bone density and structure, unspecified site: Secondary | ICD-10-CM | POA: Diagnosis not present

## 2024-02-17 DIAGNOSIS — E782 Mixed hyperlipidemia: Secondary | ICD-10-CM

## 2024-02-17 DIAGNOSIS — I483 Typical atrial flutter: Secondary | ICD-10-CM

## 2024-02-17 DIAGNOSIS — I10 Essential (primary) hypertension: Secondary | ICD-10-CM

## 2024-02-17 DIAGNOSIS — M19042 Primary osteoarthritis, left hand: Secondary | ICD-10-CM | POA: Diagnosis not present

## 2024-02-17 DIAGNOSIS — T43205A Adverse effect of unspecified antidepressants, initial encounter: Secondary | ICD-10-CM

## 2024-02-17 DIAGNOSIS — K623 Rectal prolapse: Secondary | ICD-10-CM

## 2024-02-17 DIAGNOSIS — M19041 Primary osteoarthritis, right hand: Secondary | ICD-10-CM | POA: Diagnosis not present

## 2024-02-17 DIAGNOSIS — E1122 Type 2 diabetes mellitus with diabetic chronic kidney disease: Secondary | ICD-10-CM

## 2024-02-17 DIAGNOSIS — T380X5A Adverse effect of glucocorticoids and synthetic analogues, initial encounter: Secondary | ICD-10-CM | POA: Diagnosis not present

## 2024-02-17 DIAGNOSIS — Z0000 Encounter for general adult medical examination without abnormal findings: Secondary | ICD-10-CM

## 2024-02-17 DIAGNOSIS — M818 Other osteoporosis without current pathological fracture: Secondary | ICD-10-CM | POA: Diagnosis not present

## 2024-02-17 DIAGNOSIS — E119 Type 2 diabetes mellitus without complications: Secondary | ICD-10-CM

## 2024-02-17 DIAGNOSIS — Z7984 Long term (current) use of oral hypoglycemic drugs: Secondary | ICD-10-CM | POA: Diagnosis not present

## 2024-02-17 DIAGNOSIS — T43205S Adverse effect of unspecified antidepressants, sequela: Secondary | ICD-10-CM

## 2024-02-17 DIAGNOSIS — I4892 Unspecified atrial flutter: Secondary | ICD-10-CM | POA: Diagnosis not present

## 2024-02-17 DIAGNOSIS — R4189 Other symptoms and signs involving cognitive functions and awareness: Secondary | ICD-10-CM

## 2024-02-17 DIAGNOSIS — E034 Atrophy of thyroid (acquired): Secondary | ICD-10-CM

## 2024-02-17 DIAGNOSIS — Z79899 Other long term (current) drug therapy: Secondary | ICD-10-CM

## 2024-02-17 HISTORY — DX: Adverse effect of unspecified antidepressants, initial encounter: T43.205A

## 2024-02-17 LAB — GLUCOSE, CAPILLARY: Glucose-Capillary: 125 mg/dL — ABNORMAL HIGH (ref 70–99)

## 2024-02-17 LAB — POCT GLYCOSYLATED HEMOGLOBIN (HGB A1C): Hemoglobin A1C: 7.5 % — AB (ref 4.0–5.6)

## 2024-02-17 NOTE — Assessment & Plan Note (Signed)
 Long term prednisone is a significant risk factor.  DXA completed 2023.  Check vit D level today (she does take OTC supplement).  Her life expectancy is such that she would probably benefit from a bisphosphonate.  I haven't discussed this with her yet.  She does have fall risk associated with TCA.

## 2024-02-17 NOTE — Assessment & Plan Note (Signed)
 Last checked 1 1/2 years ago.  I did not catch this before labs were ordered today-will see if I can add a CMP to her BMP sample.

## 2024-02-17 NOTE — Assessment & Plan Note (Signed)
 131/62 on amlodipine 10 mg, metoprolol 25 mg bid, no positional dizziness.  ARB stopped during hospitalization (no longer needed).

## 2024-02-17 NOTE — Assessment & Plan Note (Signed)
 Christine [name] admits that occasionally she experiences difficulty with memory though this has not bothered her more interfered with her ability to care for herself or interact with her family.  We did not have time to further proceed with a MoCA today.  She does not endorse significant anxious or depressed symptoms.  TSH 3 months ago was 2.69; B12 elevated at 1000 three years ago (she takes an MVI), vitamin D to be checked today.  She has had head imaging in the past to evaluate headaches, most recently in 2019 showed mild small vessel ischemic changes of the white matter.  She has had no strokes.  Will monitor for now.

## 2024-02-17 NOTE — Assessment & Plan Note (Signed)
 Medication concerns include long term prednisone initially prescribed for GCA/RA (subsequently used prn for headache of any type; patient has osteopenia at the very least); TCA imipramine 50 mg bid; and glimepiride 1 mg daily.

## 2024-02-17 NOTE — Assessment & Plan Note (Signed)
 Remains symptomatic and prevents her from standing/walking for prolonged periods of time.  Prior documentation indicates surgical evaluations have rendered it inoperable.  I haven't yet examined her.

## 2024-02-17 NOTE — Assessment & Plan Note (Signed)
>>  ASSESSMENT AND PLAN FOR TYPICAL ATRIAL FLUTTER (HCC) WRITTEN ON 02/17/2024  3:36 PM BY Miguel Aschoff, MD  Auscultates RRR today. Continues on new apixaban, no bleeding complications or falls, though she is at risk.

## 2024-02-17 NOTE — Assessment & Plan Note (Signed)
 ARB stopped during hospitalization (no longer needed). Most recent BMP 10/2023: BUN 20, CR 1.10, EGFR 48.  She has been CKD 3 range since 2018.  No longer on renal protective medications low given her stability and well-controlled risk factors she is unlikely to receive benefit as likelihood of developing ESRD is very low

## 2024-02-17 NOTE — Assessment & Plan Note (Signed)
 Auscultates RRR today. Continues on new apixaban, no bleeding complications or falls, though she is at risk.

## 2024-02-17 NOTE — Assessment & Plan Note (Signed)
 A1c today 7.5, decreased from 8 three months ago; on glimepiride 1 mg every morning.  She checks her blood glucose every morning; no lows (these were reviewed) and she has had no hx of hypoglycemia on this medicine.  I discussed with her my concerns about risk of hypoglycemia with these types of meds (sulfonylureas) which she and her daughter acknowledged, and that there were safer options available.  She has been on this for so long and because she has not had adverse events chooses to continue.  She is very adamant about this.

## 2024-02-17 NOTE — Patient Instructions (Signed)
 Christine [name],  Thank you for coming in today to continue our conversation about your former and present health.  I am so pleased that you are doing so well, and hope to have the pleasure of seeing you continue to thrive!.  I appreciate you sharing the story of your experience with the imipramine.  This helps me weigh the risks and benefits of the medicine and I understand that it has provided you many more benefits than risks at this point, and that you and your family have had multiple conversations about it, choosing to continue at this point in your life.  As long as you are not having problems with low blood sugar, we can continue the glimepiride for your diabetes.  It will be necessary to check your fasting blood sugar every morning as you are doing.  I will be in touch to discuss the results of today's blood tests (routine vitamin D level, kidney function, diabetes average A1c) and whether any adjustments in your therapy would be recommended.  Take care and stay well, and I look forward to seeing you in about 3 months!  Dr. Mayford Knife

## 2024-02-17 NOTE — Progress Notes (Signed)
 89 year old Christine [name] is here for an expedited 6-week follow-up, our second visit together with me as her new PCP, followed here in [facility] for HTN, COPD, CKD3, hypothyroidism, hx of giant cell arteritis/PMR, and a recent dx of A flutter-who had been hospitalized in 10/2023 for orthostatic hypotension and new onset atrial flutter.  At her last visit, we identified some potential medicine changes to discuss in the interest of safety.  She was also noted to have an abnormal screening mini cog and a MoCA was to be considered for further investigation.   She is accompanied by her daughter [name] with whom she lives.  They shared a video of her recent birthday party, attending by many family members.    Today she reports that her right hand arthritis has been particularly symptomatic; she has recently had the caps changed on her medication bottles to something more easy to manage, but does have problem opening her glucose test strip container.  In 2021 she was referred to rheumatologist with the [facility] in [facility] where she was diagnosed with right cervical radiculopathy and arthralgia of both hands.  At follow-up 08/2020 she was noted to have nodular OA in the finger joints; the radiculopathy symptoms were noted to improved after she had received a cervical epidural injection.  She was being treated with Tylenol  3 for pain.  She received a steroid injection of the left carpal metacarpal.  In 04/2022 she received a steroid injection of the right carpometacarpal.  In 06/2023 she had requested an injection of her back though the [facility] practice did not provide these and she was directed elsewhere.  Patient Active Problem List   Diagnosis Date Noted   Hx of Antidepressant discontinuation syndrome (TCA) 2018 02/17/2024   Cognitive impairment 02/01/2024   High risk medication use 12/15/2023   Hypercoagulable state due to paroxysmal atrial fibrillation (HCC) 11/06/2023   Atrial flutter (HCC)  10/28/2023   Primary osteoarthritis of both hands 05/08/2022   Gait instability 09/24/2020   Coronary artery disease involving native coronary artery of native heart 06/23/2020   Cervical radiculopathy at C7 06/23/2020   CKD stage 3 due to type 2 diabetes mellitus (HCC) 01/13/2019   Rectal prolapse 06/24/2018   Temporal arteritis (HCC) 04/06/2018   Rheumatic disease 07/29/2017   MDD (major depressive disorder), recurrent, in full remission (HCC) 11/30/2016   Elevated alkaline phosphatase level 09/18/2016   Hypertensive retinopathy of both eyes, grade 2 04/20/2015   Lumbar spondylosis 06/16/2014   Carpal tunnel syndrome 02/18/2014   Osteopenia 01/11/2013   Allergic rhinitis 02/13/2012   Hyperlipidemia 11/28/2006   Hypothyroidism 09/04/2006   Type 2 diabetes mellitus, controlled (HCC) 09/04/2006   History of migraine headaches 09/04/2006   Essential hypertension 09/04/2006   GERD 09/04/2006    Current Outpatient Medications:    acetaminophen  (TYLENOL ) 500 MG tablet, Take 1,000 mg by mouth as needed for headache. For pain, Disp: , Rfl:    amLODipine  (NORVASC ) 10 MG tablet, Take 1 tablet (10 mg total) by mouth daily., Disp: 90 tablet, Rfl: 3   apixaban  (ELIQUIS ) 2.5 MG TABS tablet, Take 1 tablet (2.5 mg total) by mouth 2 (two) times daily., Disp: 60 tablet, Rfl: 6   Ascorbic Acid  (VITAMIN C  PO), Take 2 tablets by mouth daily. , Disp: , Rfl:    atorvastatin  (LIPITOR) 40 MG tablet, TAKE 1/2 TABLET BY MOUTH EVERY EVENING, Disp: 45 tablet, Rfl: 3   Blood Glucose Monitoring Suppl (ONETOUCH VERIO REFLECT) w/Device KIT, Check  blood sugar one time daily before breakfast, Disp: 1 kit, Rfl: 1   cholecalciferol 25 MCG (1000 UT) tablet, Take 1,000 Units by mouth daily., Disp: , Rfl:    famotidine  (PEPCID ) 20 MG tablet, TAKE 1 TABLET(20 MG) BY MOUTH TWICE DAILY. FOLLOW UP FOR FURTHER REFILLS. THANK YOU, Disp: 180 tablet, Rfl: 2   fluticasone  (FLONASE ) 50 MCG/ACT nasal spray, SHAKE LIQUID AND USE 1  SPRAY IN EACH NOSTRIL EVERY DAY, Disp: 16 g, Rfl: 2   glimepiride  (AMARYL ) 1 MG tablet, TAKE 1 TABLET BY MOUTH EVERY DAY BEFORE BREAKFAST, Disp: 90 tablet, Rfl: 3   glucose blood (ONETOUCH VERIO) test strip, USE TO CHECK BLOOD SUGAR ONCE DAILY BEFORE BREAKFAST, Disp: 100 strip, Rfl: 2   imipramine  (TOFRANIL ) 50 MG tablet, TAKE 1 TABLET(50 MG) BY MOUTH TWICE DAILY (Patient taking differently: Take 50 mg by mouth 2 (two) times daily.), Disp: 180 tablet, Rfl: 3   Lancet Devices (ONETOUCH DELICA PLUS LANCING) MISC, Check blood sugar one time daily before breakfast, Disp: 1 each, Rfl: 5   Lancets (ONETOUCH DELICA PLUS LANCET33G) MISC, USE TO TEST ONCE DAILY, Disp: 100 each, Rfl: 11   levothyroxine  (SYNTHROID ) 25 MCG tablet, TAKE 1 TABLET BY MOUTH EVERY DAY, Disp: 90 tablet, Rfl: 1   loratadine  (CLARITIN ) 10 MG tablet, Take 10 mg by mouth every evening., Disp: , Rfl:    metoprolol  tartrate (LOPRESSOR ) 25 MG tablet, Take 1 tablet (25 mg total) by mouth 2 (two) times daily., Disp: 180 tablet, Rfl: 3   Multiple Vitamin (MULTIVITAMIN WITH MINERALS) TABS tablet, Take 1 tablet by mouth daily., Disp: 30 tablet, Rfl: 2  Functional Status: Ms. Hardiman lives with her daughter, upstairs in an area complete with bathroom with walk-in shower and shower stool, microwave and mini fridge.  She manages her own ADLs, though sometimes needs assistance if her hand arthritis is problematic.  Her daughter assists her with making her bed, primarily due to problems gripping bed linens associated with arthritis.  She has a rollator which she does not use regularly.  She has had no sense of instability or fear of falling, and has not had no falls since prior to her hospitalization a few months ago.  She rarely ventures down the stairs on excursion primarily for doctor visits or going shopping together-she enjoys shopping at [employer].  Her daughter manages grocery shopping.  Ms. Mullet checks her own blood sugar faithfully every morning and  writes down recordings of her vital signs.  Objective BP 131/62 (BP Location: Left Arm, Patient Position: Sitting)   Pulse (!) 59   Temp 98 F (36.7 C) (Oral)   Ht 5\' 4"  (1.626 m)   Wt 132 lb 11.2 oz (60.2 kg)   LMP 02/11/1975 (Approximate)   SpO2 100%   BMI 22.78 kg/m  Exam: Beautifully dressed, poised and  attractive lady, articulate in conversation.  Eyes are clear.  No JVD or carotid bruits.  Heart auscultates regular today, 2/6 systolic murmur at the base without radiation.  Periphery is well-perfused.  Radial pulses 2+, right DP 2+ left DP 1+.  There is no lower extremity edema.  Knees in excellent condition.  Tenderness in the right base of thumb.  Able to make grips though limited due to finger stiffness.  Wrist is tender.     Problems addressed today:  Atrial flutter (HCC) Auscultates RRR today. Continues on new apixaban , no bleeding complications or falls, though she is at risk.    High risk medication use Medication concerns  include long term prednisone  initially prescribed for GCA/RA (subsequently used prn for headache of any type; patient has osteopenia at the very least); TCA imipramine  50 mg bid; and glimepiride  1 mg daily.    Type 2 diabetes mellitus, controlled (HCC) A1c today 7.5, decreased from 8 three months ago; on glimepiride  1 mg every morning.  She checks her blood glucose every morning; no lows (these were reviewed) and she has had no hx of hypoglycemia on this medicine.  I discussed with her my concerns about risk of hypoglycemia with these types of meds (sulfonylureas) which she and her daughter acknowledged, and that there were safer options available.  She has been on this for so long and because she has not had adverse events chooses to continue.  She is very adamant about this.   Rectal prolapse Remains symptomatic and prevents her from standing/walking for prolonged periods of time.  Prior documentation indicates surgical evaluations have rendered it  inoperable.  I haven't yet examined her.   Primary osteoarthritis of both hands Base of R thumb and her wrist are most symptomatic currently.  Very mild swelling and no increased warmth, though the areas are quite tender and limit her ADLs.  Advised topical voltaren gel.  Her dtr is available to help with ADLs and tidying her home.   Osteopenia Long term prednisone  is a significant risk factor.  DXA completed 2023.  Check vit D level today (she does take OTC supplement).  Her life expectancy is such that she would probably benefit from a bisphosphonate.  I haven't discussed this with her yet.  She does have fall risk associated with TCA.  Essential hypertension 131/62 on amlodipine  10 mg, metoprolol  25 mg bid, no positional dizziness.  ARB stopped during hospitalization (no longer needed).   CKD stage 3 due to type 2 diabetes mellitus (HCC) ARB stopped during hospitalization (no longer needed). Most recent BMP 10/2023: BUN 20, CR 1.10, EGFR 48.  She has been CKD 3 range since 2018.  No longer on renal protective medications low given her stability and well-controlled risk factors she is unlikely to receive benefit as likelihood of developing ESRD is very low  Cognitive impairment Ms. Wrisley admits that occasionally she experiences difficulty with memory though this has not bothered her more interfered with her ability to care for herself or interact with her family.  We did not have time to further proceed with a MoCA today.  She does not endorse significant anxious or depressed symptoms.  TSH 3 months ago was 2.69; B12 elevated at 1000 three years ago (she takes an MVI), vitamin D  to be checked today.  She has had head imaging in the past to evaluate headaches, most recently in 2019 showed mild small vessel ischemic changes of the white matter.  She has had no strokes.  Will monitor for now.  Elevated alkaline phosphatase level Last checked 1 1/2 years ago.  I did not catch this before labs were  ordered today-will see if I can add a CMP to her BMP sample.    Return in about 3 months (around 05/18/2024) for chronic condition monitoring, med review.

## 2024-02-17 NOTE — Assessment & Plan Note (Signed)
 Base of R thumb and her wrist are most symptomatic currently.  Very mild swelling and no increased warmth, though the areas are quite tender and limit her ADLs.  Advised topical voltaren gel.  Her dtr is available to help with ADLs and tidying her home.

## 2024-02-18 LAB — BMP8+ANION GAP
Anion Gap: 15 mmol/L (ref 10.0–18.0)
BUN/Creatinine Ratio: 23 (ref 12–28)
BUN: 22 mg/dL (ref 8–27)
CO2: 21 mmol/L (ref 20–29)
Calcium: 9.6 mg/dL (ref 8.7–10.3)
Chloride: 103 mmol/L (ref 96–106)
Creatinine, Ser: 0.94 mg/dL (ref 0.57–1.00)
Glucose: 135 mg/dL — ABNORMAL HIGH (ref 70–99)
Potassium: 4.4 mmol/L (ref 3.5–5.2)
Sodium: 139 mmol/L (ref 134–144)
eGFR: 58 mL/min/{1.73_m2} — ABNORMAL LOW (ref >59)

## 2024-02-18 LAB — VITAMIN D 25 HYDROXY (VIT D DEFICIENCY, FRACTURES): Vit D, 25-Hydroxy: 42.5 ng/mL (ref 30.0–100.0)

## 2024-02-18 MED ORDER — ONETOUCH DELICA PLUS LANCING MISC: 5 refills | Status: AC

## 2024-02-19 ENCOUNTER — Encounter: Payer: Medicare Other | Admitting: Internal Medicine

## 2024-03-02 ENCOUNTER — Telehealth: Payer: Self-pay | Admitting: *Deleted

## 2024-03-02 NOTE — Progress Notes (Signed)
 Complex Care Management Care Guide Note  03/02/2024 Name: Christine [name] MRN: [id number] DOB: Aug 15, 1933  Christine [name] is a 89 y.o. year old female who is a primary care patient of Sherol Dixie, MD and is actively engaged with the care management team. I reached out to Deborra Falter by phone today to assist with re-scheduling  with the RN Case Manager.  Follow up plan: Unsuccessful telephone outreach attempt made. A HIPAA compliant phone message was left for the patient providing contact information and requesting a return call. No further outreaches will be made due to inability to maintain patient contact.   [name], CMA [place]  [facility], [facility] Guide Direct Dial: [phone]  Fax: [phone] Website: [email or web address]

## 2024-03-07 NOTE — Assessment & Plan Note (Signed)
 Hospitalized 2018; prolonged delirium, followed for a period of time by geriatric psychiatrist Dr. Aloha [name] in [place]; all sxs resolved with resumption of imipramine . Patient and family plan to continue this life-long unless she develops an adverse event. She is aware of risks of falls, cognitive decline, dry mouth, constipation. It has been prescribed for decades (initially for headache).

## 2024-03-18 ENCOUNTER — Other Ambulatory Visit: Payer: Self-pay | Admitting: Internal Medicine

## 2024-03-18 DIAGNOSIS — E119 Type 2 diabetes mellitus without complications: Secondary | ICD-10-CM

## 2024-03-29 NOTE — Telephone Encounter (Signed)
 Unable to reach the Christine.  Schedule for Dr Broadus Canes has not yet been posted.  The Christine will need to call back.  Copied from CRM [id number]. Topic: Appointments - Appointment Scheduling >> Mar 26, 2024  3:48 PM Christine [name] wrote: Please call Christine [name] to set up a July 1st appt. It would not let me schedule past June 12th, with any of the doctors.

## 2024-03-30 ENCOUNTER — Telehealth: Payer: Self-pay

## 2024-03-30 DIAGNOSIS — E119 Type 2 diabetes mellitus without complications: Secondary | ICD-10-CM

## 2024-03-30 NOTE — Telephone Encounter (Signed)
 Received a fax from the pharmacy, per pharmacy "Supporting SWO documentation required"

## 2024-04-06 ENCOUNTER — Telehealth: Payer: Self-pay | Admitting: Internal Medicine

## 2024-04-06 NOTE — Telephone Encounter (Signed)
 [name] is an 89 year old with PMH of afib, well controlled diabetes, depression and hypothyroidism who follows with Dr. Broadus Canes, last seen in clinic 4/25.   Her daughter called after hours clinic line as they traveled to [place] today and once arriving realized that patient did not have several medications. She does have eliquis  and metoprolol .  Other medications include amlodpine 10 mg, atorvastatin , glimepiride , imipramine , levothyroxine  and claritin .  Her blood pressure was well controlled at April visit on 2 meds. A1c at 7.5%.   They will be coming back from [facility] Wednesday evening.   A/P: With short duration of trip and fact that she has eliquis  and metoprolol  with her, I think it is safe for her to miss her other medications for one day.  I asked her daughter to return call to clinic if trip had to be extended for some reason and we could make another plan to get rest of medications to her.

## 2024-04-08 ENCOUNTER — Other Ambulatory Visit (HOSPITAL_COMMUNITY): Payer: Self-pay

## 2024-04-08 NOTE — Telephone Encounter (Signed)
 I'm not sure.. that's the note the pharmacists left.

## 2024-04-08 NOTE — Telephone Encounter (Signed)
 I called the pharmacy and the pharmacist stated that the patients insurance requires a standard written order form. I asked the pharmacist if they were able to fax us  the form she stated that medicare would have to send us  the form. Aron Lard, are you able to help assist with this?

## 2024-04-08 NOTE — Telephone Encounter (Signed)
 Lancets (ONETOUCH DELICA PLUS LANCET33G) MISC

## 2024-04-13 ENCOUNTER — Telehealth: Payer: Self-pay | Admitting: Internal Medicine

## 2024-04-13 DIAGNOSIS — E119 Type 2 diabetes mellitus without complications: Secondary | ICD-10-CM

## 2024-04-13 NOTE — Telephone Encounter (Unsigned)
 Copied from CRM [id number]. Topic: Clinical - Medication Refill >> Apr 13, 2024 11:00 AM Retta Caster wrote: Medication: Lancets [employer] DELICA PLUS [place]) MISC   Has the patient contacted their pharmacy? Yes (Agent: If no, request that the patient contact the pharmacy for the refill. If patient does not wish to contact the pharmacy document the reason why and proceed with request.) (Agent: If yes, when and what did the pharmacy advise?)  This is the patient's preferred pharmacy:  [facility] STORE #[id number] Jonette Nestle, [place] - [address] AT [facility] [address] [place] [address] Phone: [phone] Fax: [phone]   Is this the correct pharmacy for this prescription? Yes If no, delete pharmacy and type the correct one.   Has the prescription been filled recently? Yes  Is the patient out of the medication? Yes  Has the patient been seen for an appointment in the last year OR does the patient have an upcoming appointment? Yes  Can we respond through MyChart? Yes  Agent: Please be advised that Rx refills may take up to 3 business days. We ask that you follow-up with your pharmacy.

## 2024-04-16 ENCOUNTER — Ambulatory Visit: Payer: Self-pay

## 2024-04-16 NOTE — Telephone Encounter (Signed)
 Copied from CRM [id number]. Topic: Clinical - Red Word Triage >> Apr 16, 2024  2:29 PM Tisa Forester wrote: Red Word that prompted transfer to Nurse Triage: last 2 weeks blood sugar been running high, patient stated this morning her blood sugar is  200  Patient call back number [phone]   Called to make an appointment but has to call daughter for a ride.  Will call back to schedule

## 2024-04-22 ENCOUNTER — Encounter: Payer: Self-pay | Admitting: Student

## 2024-04-22 ENCOUNTER — Ambulatory Visit: Payer: Self-pay | Admitting: Student

## 2024-04-22 VITALS — BP 127/56 | HR 60 | Temp 98.4°F | Ht 64.0 in | Wt 130.0 lb

## 2024-04-22 DIAGNOSIS — Z7984 Long term (current) use of oral hypoglycemic drugs: Secondary | ICD-10-CM

## 2024-04-22 DIAGNOSIS — E119 Type 2 diabetes mellitus without complications: Secondary | ICD-10-CM

## 2024-04-22 DIAGNOSIS — E039 Hypothyroidism, unspecified: Secondary | ICD-10-CM | POA: Diagnosis not present

## 2024-04-22 DIAGNOSIS — I10 Essential (primary) hypertension: Secondary | ICD-10-CM | POA: Diagnosis not present

## 2024-04-22 NOTE — Assessment & Plan Note (Signed)
 BP on repeat 127/56. Taking amlodipine  10 mg daily and metop 25 mg BID. Took medications this morning. No acute concerns or symptoms at this time.   Plan -Continue amlodipine  and metoprolol 

## 2024-04-22 NOTE — Progress Notes (Signed)
 CC: Hyperglycemia  HPI:  [name] is a 89 y.o. female living with a history stated below and presents today for hyperglcyemia. Please see problem based assessment and plan for additional details.  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 LHC 08/23/11: dLM 40-50%, oRI 40%, oCFX 40%, oD1 70% (small and not amenable to PCI).  LM lesion did not appear to be flow limiting.  Medical rx was recommended;  b. Echo 08/23/11: mild LVH, EF 60-65%, grade 1 diast dysfxn, mild BAE, PASP 24;  c. 02/2012  Cath: LM 50d, LAD 50p, D1 70ost, RI 70p, RCA ok->Med Rx;  d. 01/2013 Cardiolite : EF 88, no ischemia/infarct.   Carotid stenosis    a. dopplers 10/12:  0-39% bilat ICA;  b. 10/2012 U/S: 0-39% bilat, f/u 1 yr (10/2013).   Chronic bronchitis    Chronotropic incompetence 05/01/2021   Depression    Diverticulosis 09/18/2012   Noted scope 2009 and 2019 in the sigmoid.  No history of diverticulitis.      Diverticulosis of colon (without mention of hemorrhage)    GERD (gastroesophageal reflux disease)    Heart murmur, systolic    2-D echo in December 2011 showed a normal EF with grade 1 diastolic dysfunction, trivial pulmonary regurgitation and mildly elevated PA pressure at 37 mmHg probably secondary to her COPD.dynamic obstruction-mid cavity obliteration;  b. 02/2012 Echo: EF 60-65%, mild LVH, PASP .   History of migraine headaches 09/04/2006   Migraine history since early life.   Long term imipramine  for suppression (> 40 years).  Developed depression with psychotic features and delirium when attempted to wean.  Sxs resolved when imipramine   resumed.  Plan is for continuation.  She identifies any headache as indicative of migraine.         History of stomach ulcers 1970's   Hx of Antidepressant discontinuation syndrome (TCA) 2018 02/17/2024   Hyperlipidemia    Hypertension    Hypothyroidism    Migraines    a. Next atypical symptoms in the past. Patient was started on Neurontin for possible neuropathic origin of her pain.   Polymyalgia rheumatica (HCC)    Premature atrial contraction 05/01/2021   She remains asymptomatic.  No medication indicated.   Shortness of breath on exertion    "just related to angina >1 yr ago" (09/23/2013)   Sinus arrhythmia    Solitary pulmonary nodule 12/15/2006   CT Chest in January 2008  IMPRESSION:  1. Stable CT of the chest with mild biapical scarring and scattered nodularity, most likely postinflammatory in the absence of a history of malignancy.  2. No acute chest findings are demonstrated.  3. Unless the patient has a history of malignancy or risk factors for lung cancer, the chest findings do not necessarily require any specific followup. If follow    Type II diabetes mellitus (HCC)    a. On oral hypoglycemic agents.    Current Outpatient Medications on File Prior to Visit  Medication Sig Dispense Refill   acetaminophen  (TYLENOL ) 500 MG tablet Take 1,000 mg by mouth as needed for  headache. For pain     amLODipine  (NORVASC ) 10 MG tablet Take 1 tablet (10 mg total) by mouth daily. 90 tablet 3   apixaban  (ELIQUIS ) 2.5 MG TABS tablet Take 1 tablet (2.5 mg total) by mouth 2 (two) times daily. 60 tablet 6   Ascorbic Acid  (VITAMIN C  PO) Take 2 tablets by mouth daily.      atorvastatin  (LIPITOR) 40 MG tablet TAKE 1/2 TABLET BY MOUTH EVERY EVENING 45 tablet 3   Blood Glucose Monitoring Suppl (ONETOUCH VERIO REFLECT) w/Device KIT Check blood sugar one time daily before breakfast 1 kit 1   cholecalciferol 25 MCG (1000 UT) tablet Take 1,000 Units by mouth daily.     famotidine  (PEPCID ) 20 MG tablet TAKE  1 TABLET(20 MG) BY MOUTH TWICE DAILY. FOLLOW UP FOR FURTHER REFILLS. THANK YOU 180 tablet 2   fluticasone  (FLONASE ) 50 MCG/ACT nasal spray SHAKE LIQUID AND USE 1 SPRAY IN EACH NOSTRIL EVERY DAY 16 g 2   glimepiride  (AMARYL ) 1 MG tablet TAKE 1 TABLET BY MOUTH EVERY DAY BEFORE BREAKFAST 90 tablet 3   glucose blood (ONETOUCH VERIO) test strip USE TO CHECK BLOOD SUGAR ONCE DAILY BEFORE BREAKFAST 100 strip 2   imipramine  (TOFRANIL ) 50 MG tablet TAKE 1 TABLET(50 MG) BY MOUTH TWICE DAILY (Patient taking differently: Take 50 mg by mouth 2 (two) times daily.) 180 tablet 3   Lancet Devices (ONETOUCH DELICA PLUS LANCING) MISC Check blood sugar one time daily before breakfast 1 each 5   Lancets (ONETOUCH DELICA PLUS LANCET33G) MISC USE TO TEST DAILY 100 each 5   levothyroxine  (SYNTHROID ) 25 MCG tablet TAKE 1 TABLET BY MOUTH EVERY DAY 90 tablet 1   loratadine  (CLARITIN ) 10 MG tablet Take 10 mg by mouth every evening.     metoprolol  tartrate (LOPRESSOR ) 25 MG tablet Take 1 tablet (25 mg total) by mouth 2 (two) times daily. 180 tablet 3   Multiple Vitamin (MULTIVITAMIN WITH MINERALS) TABS tablet Take 1 tablet by mouth daily. 30 tablet 2   No current facility-administered medications on file prior to visit.    Family History  Problem Relation Age of Onset   Colon cancer Maternal Grandmother    Heart attack Maternal Grandmother    COPD Father    Other Mother        died of unknown causes in her 23's. Pt raised by grandmother.   Pulmonary Hypertension Daughter     Social History   Socioeconomic History   Marital status: Widowed    Spouse name: Not on file   Number of children: 7   Years of education: Not on file   Highest education level: Not on file  Occupational History   Occupation: retired  Tobacco Use   Smoking status: Never   Smokeless tobacco: Never  Vaping Use   Vaping status: Never Used  Substance and Sexual Activity   Alcohol  use: No    Comment: 11/29/2016 "Last drink 1967"   Drug  use: No   Sexual activity: Not Currently  Other Topics Concern   Not on file  Social History Narrative   Never smoked. Lives in [place] with her dtr.  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   Social Drivers of Health   Financial Resource Strain: Low Risk  (05/16/2023)   Overall Financial Resource Strain (CARDIA)    Difficulty of Paying Living Expenses: Not very hard  Food Insecurity: No Food Insecurity (10/31/2023)   Hunger Vital Sign  Worried About Programme researcher, broadcasting/film/video in the Last Year: Never true    Ran Out of Food in the Last Year: Never true  Transportation Needs: No Transportation Needs (10/31/2023)   PRAPARE - Administrator, Civil Service (Medical): No    Lack of Transportation (Non-Medical): No  Physical Activity: Inactive (05/16/2023)   Exercise Vital Sign    Days of Exercise per Week: 0 days    Minutes of Exercise per Session: 0 min  Stress: No Stress Concern Present (05/16/2023)   [employer] of Occupational Health - Occupational Stress Questionnaire    Feeling of Stress : Only a little  Social Connections: Moderately Isolated (05/16/2023)   Social Connection and Isolation Panel [NHANES]    Frequency of Communication with Friends and Family: More than three times a week    Frequency of Social Gatherings with Friends and Family: More than three times a week    Attends Religious Services: 1 to 4 times per year    Active Member of [employer] or Organizations: No    Attends Banker Meetings: Never    Marital Status: Widowed  Intimate Partner Violence: Patient Unable To Answer (10/31/2023)   Humiliation, Afraid, Rape, and Kick questionnaire    Fear of Current or Ex-Partner: Patient unable to answer    Emotionally Abused: Patient unable to answer    Physically Abused: Patient unable to answer    Sexually Abused: Patient unable to answer    Review of Systems: ROS negative except for what is noted on the  assessment and plan.  Vitals:   04/22/24 1503 04/22/24 1558  BP: (!) 141/62 (!) 127/56  Pulse: 62 60  Temp: 98.4 F (36.9 C)   TempSrc: Oral   SpO2: 95%   Weight: 130 lb (59 kg)   Height: 5\' 4"  (1.626 m)    Physical Exam: Constitutional: alert, sitting in wheelchair comfortably, in no acute distress Cardiovascular: regular rate Pulmonary/Chest: normal work of breathing on room air Neurological: alert & oriented x 3 Psych: pleasant mood  Assessment & Plan:   Type 2 diabetes mellitus, controlled (HCC) Acute visit due to concerns about hyperglycemia. Patient provided glucose log for past 2 months. Fasting glucose ranging from 120-190 with past month more 170-190. Denies hypoglycemia. Taking glimepiride  1 mg daily but over past 1 week has self-increased to 2 mg daily. Glucose for past week has been stable, no lows, around 100-120. Discussed T2DM well controlled with last A1c in April being 7.5% form 8. Not candidate for GLP-1 (BMI and age) or SGLT-2 (hx of UTI and yeast infections). Not tolerate metformin in past due to GI effects and declined starting it at low dose. Discussed with patient and patient's daughter about caution with glimepiride  use and self titrating it.   Plan -Continue glimepiride  2 mg daily but advised to stop/reduce if poor appetite or sick, advised pt to reduce back to 1 mg but patient declined and rather continue with 2 mg despite extensive discussion  -Continue glucose monitoring, patient/pt's daughter aware of contacting office if hypoglycemia  -Repeat A1c at next OV   Essential hypertension BP on repeat 127/56. Taking amlodipine  10 mg daily and metop 25 mg BID. Took medications this morning. No acute concerns or symptoms at this time.   Plan -Continue amlodipine  and metoprolol    Hypothyroidism TSH 2.696 6 months ago. Patient has been inconsistently taking synthroid  25 mcg. Repeat TSH level today.    Patient discussed with Dr. Juanna Norman,  D.[facility], PGY-2 Phone: [phone] Date 04/22/2024 Time 5:40 PM

## 2024-04-22 NOTE — Patient Instructions (Addendum)
 Thank you, [name] for allowing us  to provide your care today. Today we discussed   -Your diabetes is already doing well. Your A1c is at the goal we want so no big changes since diabetes under control!  -Please be cautious with taking glimepiride  2 mg daily. Check your blood sugar daily and if you feel sick or symptoms check blood sugar as well.  -If you have poor appetite or not eating or sick, do NOT take the glimepiride .   -Blood work today to check thyroid , likely will continue taking levothyroxine    Follow up: 1 month    Should you have any questions or concerns please call the internal medicine clinic at [phone].    Christine [name], D.O. [facility]

## 2024-04-22 NOTE — Assessment & Plan Note (Addendum)
 Acute visit due to concerns about hyperglycemia. Patient provided glucose log for past 2 months. Fasting glucose ranging from 120-190 with past month more 170-190. Denies hypoglycemia. Taking glimepiride  1 mg daily but over past 1 week has self-increased to 2 mg daily. Glucose for past week has been stable, no lows, around 100-120. Discussed T2DM well controlled with last A1c in April being 7.5% form 8. Not candidate for GLP-1 (BMI and age) or SGLT-2 (hx of UTI and yeast infections). Not tolerate metformin in past due to GI effects and declined starting it at low dose. Discussed with patient and patient's daughter about caution with glimepiride  use and self titrating it.   Plan -Continue glimepiride  2 mg daily but advised to stop/reduce if poor appetite or sick, advised pt to reduce back to 1 mg but patient declined and rather continue with 2 mg despite extensive discussion  -Continue glucose monitoring, patient/pt's daughter aware of contacting office if hypoglycemia  -Repeat A1c at next OV

## 2024-04-22 NOTE — Assessment & Plan Note (Addendum)
 TSH 2.696 6 months ago. Patient has been inconsistently taking synthroid  25 mcg. Repeat TSH level today.

## 2024-04-23 LAB — TSH: TSH: 3 u[IU]/mL (ref 0.450–4.500)

## 2024-04-26 ENCOUNTER — Ambulatory Visit: Payer: Self-pay | Admitting: Student

## 2024-04-26 NOTE — Telephone Encounter (Signed)
 Pharmacy says the SWO form comes from [facility] department and they should fax it to us  to complete. She also said from the pharmacy, all went through and without problems. Lancets went through for 0.28$, Test strips went through $3.33  Will ask Hme about if the form has been received and I would suspect any of our doctors can sign it once competed.

## 2024-04-26 NOTE — Progress Notes (Signed)
 Internal Medicine Clinic Attending  Case discussed with the resident at the time of the visit.  We reviewed the resident's history and exam and pertinent patient test results.  I agree with the assessment, diagnosis, and plan of care documented in the resident's note.

## 2024-04-28 NOTE — Telephone Encounter (Signed)
 Hi Christine [name], Forms has already been completed by Dr. Broadus Canes  and faxed back to [facility] on 04/19/2024. Forms is scanned in pt chart under media.

## 2024-05-04 ENCOUNTER — Other Ambulatory Visit: Payer: Self-pay | Admitting: Student

## 2024-05-04 ENCOUNTER — Other Ambulatory Visit: Payer: Self-pay | Admitting: Internal Medicine

## 2024-05-04 DIAGNOSIS — E039 Hypothyroidism, unspecified: Secondary | ICD-10-CM

## 2024-05-04 NOTE — Telephone Encounter (Signed)
 Medication discontinued 10/29/23

## 2024-05-04 NOTE — Telephone Encounter (Signed)
 Medication sent to pharmacy

## 2024-05-06 ENCOUNTER — Ambulatory Visit (HOSPITAL_COMMUNITY): Payer: Medicare Other | Admitting: Internal Medicine

## 2024-05-11 ENCOUNTER — Ambulatory Visit (HOSPITAL_COMMUNITY)
Admission: RE | Admit: 2024-05-11 | Discharge: 2024-05-11 | Disposition: A | Discharge status: Home / Self Care | Source: Ambulatory Visit | Attending: Internal Medicine | Admitting: Internal Medicine

## 2024-05-11 VITALS — BP 122/62 | HR 63 | Ht 64.0 in | Wt 126.6 lb

## 2024-05-11 DIAGNOSIS — I4892 Unspecified atrial flutter: Secondary | ICD-10-CM | POA: Diagnosis not present

## 2024-05-11 DIAGNOSIS — D6869 Other thrombophilia: Secondary | ICD-10-CM | POA: Diagnosis not present

## 2024-05-11 DIAGNOSIS — Z7901 Long term (current) use of anticoagulants: Secondary | ICD-10-CM | POA: Insufficient documentation

## 2024-05-11 DIAGNOSIS — J449 Chronic obstructive pulmonary disease, unspecified: Secondary | ICD-10-CM | POA: Diagnosis not present

## 2024-05-11 DIAGNOSIS — I480 Paroxysmal atrial fibrillation: Secondary | ICD-10-CM

## 2024-05-11 DIAGNOSIS — I10 Essential (primary) hypertension: Secondary | ICD-10-CM | POA: Diagnosis not present

## 2024-05-11 DIAGNOSIS — I4891 Unspecified atrial fibrillation: Secondary | ICD-10-CM | POA: Insufficient documentation

## 2024-05-11 DIAGNOSIS — E039 Hypothyroidism, unspecified: Secondary | ICD-10-CM | POA: Diagnosis not present

## 2024-05-11 DIAGNOSIS — Z79899 Other long term (current) drug therapy: Secondary | ICD-10-CM | POA: Diagnosis not present

## 2024-05-11 DIAGNOSIS — E119 Type 2 diabetes mellitus without complications: Secondary | ICD-10-CM | POA: Insufficient documentation

## 2024-05-11 DIAGNOSIS — M315 Giant cell arteritis with polymyalgia rheumatica: Secondary | ICD-10-CM | POA: Insufficient documentation

## 2024-05-11 DIAGNOSIS — Z7984 Long term (current) use of oral hypoglycemic drugs: Secondary | ICD-10-CM | POA: Diagnosis not present

## 2024-05-11 NOTE — Progress Notes (Signed)
 Primary Care Physician: [name], MD Primary Cardiologist: Annabella Scarce, MD Electrophysiologist: None     Referring Physician: Dr. Kate Marcos Christine [name] is a 89 y.o. female with a history of COPD, CAD, HTN, T2DM, hypothyroidism, giant cell arteritis, PMR, and paroxysmal atrial flutter who presents for consultation in the [facility]. Hospital admission 12/9-11/24 for lightheadedness found to be in new onset atrial flutter. Discharged on Lopressor  25 mg BID. Plavix  stopped. Patient is on Eliquis  2.5 mg BID for a CHADS2VASC score of 6.  On follow up 05/11/24, she is currently in NSR. She has felt overall well since last office visit and does not have any new concerns. No bleeding issues on Eliquis  2.5 mg BID.   Today, she denies symptoms of palpitations, chest pain, shortness of breath, orthopnea, PND, lower extremity edema, dizziness, presyncope, syncope, snoring, daytime somnolence, bleeding, or neurologic sequela. The patient is tolerating medications without difficulties and is otherwise without complaint today.   she has a BMI of Body mass index is 21.73 kg/m.SABRA Filed Weights   05/11/24 1003  Weight: 57.4 kg     Current Outpatient Medications  Medication Sig Dispense Refill   acetaminophen  (TYLENOL ) 500 MG tablet Take 1,000 mg by mouth as needed for headache. For pain     amLODipine  (NORVASC ) 10 MG tablet Take 1 tablet (10 mg total) by mouth daily. 90 tablet 3   apixaban  (ELIQUIS ) 2.5 MG TABS tablet Take 1 tablet (2.5 mg total) by mouth 2 (two) times daily. 60 tablet 6   Ascorbic Acid  (VITAMIN C  PO) Take 2 tablets by mouth daily.      atorvastatin  (LIPITOR) 40 MG tablet TAKE 1/2 TABLET BY MOUTH EVERY EVENING 45 tablet 3   Blood Glucose Monitoring Suppl (ONETOUCH VERIO REFLECT) w/Device KIT Check blood sugar one time daily before breakfast 1 kit 1   cholecalciferol 25 MCG (1000 UT) tablet Take 1,000 Units by mouth daily.      famotidine  (PEPCID ) 20 MG tablet TAKE 1 TABLET(20 MG) BY MOUTH TWICE DAILY. FOLLOW UP FOR FURTHER REFILLS. THANK YOU (Patient taking differently: Take 20 mg by mouth daily.) 180 tablet 2   fluticasone  (FLONASE ) 50 MCG/ACT nasal spray SHAKE LIQUID AND USE 1 SPRAY IN EACH NOSTRIL EVERY DAY 16 g 2   glimepiride  (AMARYL ) 1 MG tablet TAKE 1 TABLET BY MOUTH EVERY DAY BEFORE BREAKFAST 90 tablet 3   glucose blood (ONETOUCH VERIO) test strip USE TO CHECK BLOOD SUGAR ONCE DAILY BEFORE BREAKFAST 100 strip 2   imipramine  (TOFRANIL ) 50 MG tablet TAKE 1 TABLET(50 MG) BY MOUTH TWICE DAILY 180 tablet 3   Lancet Devices (ONETOUCH DELICA PLUS LANCING) MISC Check blood sugar one time daily before breakfast 1 each 5   Lancets (ONETOUCH DELICA PLUS LANCET33G) MISC USE TO TEST DAILY 100 each 5   levothyroxine  (SYNTHROID ) 25 MCG tablet TAKE 1 TABLET BY MOUTH EVERY DAY 90 tablet 1   loratadine  (CLARITIN ) 10 MG tablet Take 10 mg by mouth every evening.     metoprolol  tartrate (LOPRESSOR ) 25 MG tablet Take 1 tablet (25 mg total) by mouth 2 (two) times daily. 180 tablet 3   Multiple Vitamin (MULTIVITAMIN WITH MINERALS) TABS tablet Take 1 tablet by mouth daily. 30 tablet 2   No current facility-administered medications for this encounter.    Atrial Fibrillation Management history:  Previous antiarrhythmic drugs: none Previous cardioversions: none Previous ablations: none Anticoagulation history: Eliquis  2.5 mg BID   ROS-  All systems are reviewed and negative except as per the HPI above.  Physical Exam: BP 122/62   Pulse 63   Ht 5' 4 (1.626 m)   Wt 57.4 kg   LMP 02/11/1975 (Approximate)   BMI 21.73 kg/m   GEN- The patient is well appearing, alert and oriented x 3 today.   Neck - no JVD or carotid bruit noted Lungs- Clear to ausculation bilaterally, normal work of breathing Heart- Regular rate and rhythm, no murmurs, rubs or gallops, PMI not laterally displaced Extremities- no clubbing, cyanosis, or  edema Skin - no rash or ecchymosis noted   EKG today demonstrates  Vent. rate 63 BPM PR interval * ms QRS duration 86 ms QT/QTcB 418/427 ms P-R-T axes * -51 36 Sinus rhythm with premature atrial complexes Possible Anteroseptal infarct (cited on or before 15-Sep-2023) Abnormal ECG When compared with ECG of 06-Nov-2023 15:13, No significant change was found Confirmed by Terra Pac ([id number]) on 05/11/2024 10:27:30 AM  Echo 10/28/23 demonstrated  1. Left ventricular ejection fraction, by estimation, is 70 to 75%. Left  ventricular ejection fraction by PLAX is 73 %. The left ventricle has  hyperdynamic function. The left ventricle has no regional wall motion  abnormalities. There is mild concentric  left ventricular hypertrophy. Left ventricular diastolic function could  not be evaluated.   2. Right ventricular systolic function is normal. The right ventricular  size is not well visualized. There is normal pulmonary artery systolic  pressure. The estimated right ventricular systolic pressure is 24.5 mmHg.   3. Left atrial size was mildly dilated.   4. Right atrial size was mildly dilated.   5. The mitral valve is abnormal. Trivial mitral valve regurgitation.   6. The aortic valve has an indeterminant number of cusps. Aortic valve  regurgitation is not visualized. Aortic valve sclerosis is present, with  no evidence of aortic valve stenosis.   7. The inferior vena cava is normal in size with greater than 50%  respiratory variability, suggesting right atrial pressure of 3 mmHg.   ASSESSMENT & PLAN CHA2DS2-VASc Score = 6  The patient's score is based upon: CHF History: 0 HTN History: 1 Diabetes History: 1 Stroke History: 0 Vascular Disease History: 1 Age Score: 2 Gender Score: 1       ASSESSMENT AND PLAN: Paroxysmal Atrial Flutter The patient's CHA2DS2-VASc score is 6, indicating a 9.7% annual risk of stroke.    She is currently in NSR. Continue Lopressor  25 mg BID.    Secondary Hypercoagulable State (ICD10:  D68.69) The patient is at significant risk for stroke/thromboembolism based upon her CHA2DS2-VASc Score of 6.  Continue Apixaban  (Eliquis ).  Continue Eliquis  2.5 mg BID. Dosage is appropriate based on age and weight < 60 kg.    Follow up 6 months Afib clinic.    Terra Pac, PA-C  Af[facility] [address] [place], [place] [address] [phone]

## 2024-05-20 ENCOUNTER — Ambulatory Visit: Admitting: Internal Medicine

## 2024-05-20 VITALS — BP 142/54 | HR 76 | Temp 97.6°F | Ht 64.0 in | Wt 126.6 lb

## 2024-05-20 DIAGNOSIS — I10 Essential (primary) hypertension: Secondary | ICD-10-CM | POA: Diagnosis not present

## 2024-05-20 DIAGNOSIS — Z7984 Long term (current) use of oral hypoglycemic drugs: Secondary | ICD-10-CM

## 2024-05-20 DIAGNOSIS — E119 Type 2 diabetes mellitus without complications: Secondary | ICD-10-CM

## 2024-05-20 LAB — POCT GLYCOSYLATED HEMOGLOBIN (HGB A1C): Hemoglobin A1C: 6.6 % — AB (ref 4.0–5.6)

## 2024-05-20 LAB — GLUCOSE, CAPILLARY: Glucose-Capillary: 134 mg/dL — ABNORMAL HIGH (ref 70–99)

## 2024-05-20 NOTE — Progress Notes (Signed)
 89 year old Christine [name] is here routine 30M follow-up of chronic conditions including HTN, COPD, CKD3, hypothyroidism, hx of giant cell arteritis/PMR, and a recent dx of A flutter-who had been hospitalized in 10/2023 for orthostatic hypotension and new onset atrial flutter.  She is by herself today, brought by her grandson (dtr Pomeroy working today)  My last visit with her was on 02/17/2024 at which time her A1c had improved from 8 to 7.5, and a BMP was updated revealing EGFR of 58.  We addressed her hand arthritis stiffness and pain.  She reports doing well with no concerns except for some elevated blood sugars. She had recently (6/26) started again taking glimeperide 2 x 1 mg daily to address some higher morning readings.  Changed diet to more salads to control blood sugar, got it down, then reduced glimepiride  back to one pill a day on the 29th.  Checks blood sugars fasting daily, then prn later in the day.  Lowest has been 88, in the am, asymptomatic, which was when she dropped dose back down.  She records meticulous records on daily CBGs, Bps, and weight.  Sleeping ok, good appetite, stable weight, bowels and bladder functioning well.  She continues to maintain interest in staying as healthy as she can.   Patient Active Problem List   Diagnosis Date Noted   Hx of Antidepressant discontinuation syndrome (TCA) 2018 02/17/2024   Cognitive impairment 02/01/2024   High risk medication use 12/15/2023   Hypercoagulable state due to paroxysmal atrial fibrillation (HCC) 11/06/2023   Atrial flutter (HCC) 10/28/2023   Primary osteoarthritis of both hands 05/08/2022   Gait instability 09/24/2020   Coronary artery disease involving native coronary artery of native heart 06/23/2020   Cervical radiculopathy at C7 06/23/2020   CKD stage 3 due to type 2 diabetes mellitus (HCC) 01/13/2019   Rectal prolapse 06/24/2018   Temporal arteritis (HCC) 04/06/2018   Rheumatic disease 07/29/2017   MDD (major depressive  disorder), recurrent, in full remission (HCC) 11/30/2016   Elevated alkaline phosphatase level 09/18/2016   Hypertensive retinopathy of both eyes, grade 2 04/20/2015   Lumbar spondylosis 06/16/2014   Carpal tunnel syndrome 02/18/2014   Osteopenia 01/11/2013   Allergic rhinitis 02/13/2012   Hyperlipidemia 11/28/2006   Hypothyroidism 09/04/2006   Type 2 diabetes mellitus, controlled (HCC) 09/04/2006   History of migraine headaches 09/04/2006   Essential hypertension 09/04/2006   GERD 09/04/2006    Current Outpatient Medications:    acetaminophen  (TYLENOL ) 500 MG tablet, Take 1,000 mg by mouth as needed for headache. For pain, Disp: , Rfl:    amLODipine  (NORVASC ) 10 MG tablet, Take 1 tablet (10 mg total) by mouth daily., Disp: 90 tablet, Rfl: 3   apixaban  (ELIQUIS ) 2.5 MG TABS tablet, Take 1 tablet (2.5 mg total) by mouth 2 (two) times daily., Disp: 60 tablet, Rfl: 6   Ascorbic Acid  (VITAMIN C  PO), Take 2 tablets by mouth daily. , Disp: , Rfl:    atorvastatin  (LIPITOR) 40 MG tablet, TAKE 1/2 TABLET BY MOUTH EVERY EVENING, Disp: 45 tablet, Rfl: 3   Blood Glucose Monitoring Suppl (ONETOUCH VERIO REFLECT) w/Device KIT, Check blood sugar one time daily before breakfast, Disp: 1 kit, Rfl: 1   cholecalciferol 25 MCG (1000 UT) tablet, Take 1,000 Units by mouth daily., Disp: , Rfl:    famotidine  (PEPCID ) 20 MG tablet, TAKE 1 TABLET(20 MG) BY MOUTH TWICE DAILY. FOLLOW UP FOR FURTHER REFILLS. THANK YOU (Patient taking differently: Take 20 mg by mouth daily.), Disp: 180 tablet,  Rfl: 2   fluticasone  (FLONASE ) 50 MCG/ACT nasal spray, SHAKE LIQUID AND USE 1 SPRAY IN EACH NOSTRIL EVERY DAY, Disp: 16 g, Rfl: 2   glimepiride  (AMARYL ) 1 MG tablet, TAKE 1 TABLET BY MOUTH EVERY DAY BEFORE BREAKFAST, Disp: 90 tablet, Rfl: 3   glucose blood (ONETOUCH VERIO) test strip, USE TO CHECK BLOOD SUGAR ONCE DAILY BEFORE BREAKFAST, Disp: 100 strip, Rfl: 2   imipramine  (TOFRANIL ) 50 MG tablet, TAKE 1 TABLET(50 MG) BY MOUTH  TWICE DAILY, Disp: 180 tablet, Rfl: 3   Lancet Devices (ONETOUCH DELICA PLUS LANCING) MISC, Check blood sugar one time daily before breakfast, Disp: 1 each, Rfl: 5   Lancets (ONETOUCH DELICA PLUS LANCET33G) MISC, USE TO TEST DAILY, Disp: 100 each, Rfl: 5   levothyroxine  (SYNTHROID ) 25 MCG tablet, TAKE 1 TABLET BY MOUTH EVERY DAY, Disp: 90 tablet, Rfl: 1   loratadine  (CLARITIN ) 10 MG tablet, Take 10 mg by mouth every evening., Disp: , Rfl:    metoprolol  tartrate (LOPRESSOR ) 25 MG tablet, Take 1 tablet (25 mg total) by mouth 2 (two) times daily., Disp: 180 tablet, Rfl: 3   Multiple Vitamin (MULTIVITAMIN WITH MINERALS) TABS tablet, Take 1 tablet by mouth daily., Disp: 30 tablet, Rfl: 2  Functional Status: Christine [name] lives with her daughter, upstairs in an area complete with bathroom with walk-in shower and shower stool, microwave and mini fridge.  She manages her own ADLs, though sometimes needs assistance if her hand arthritis is problematic.  Her daughter assists her with making her bed, primarily due to problems gripping bed linens associated with arthritis.  She has a rollator which she does not use regularly.  She has had no sense of instability or fear of falling, and has not had no falls since prior to her hospitalization a few months ago.  She rarely ventures down the stairs on excursion primarily for doctor visits or going shopping together-she enjoys shopping at [employer].  Her daughter manages grocery shopping.  Christine [name] checks her own blood sugar faithfully every morning and writes down recordings of her vital signs.   Objective BP (!) 142/54 (BP Location: Right Arm, Patient Position: Sitting, Cuff Size: Small)   Pulse 76   Temp 97.6 F (36.4 C) (Oral)   Ht 5' 4 (1.626 m)   Wt 126 lb 9.6 oz (57.4 kg)   LMP 02/11/1975 (Approximate)   SpO2 100%   BMI 21.73 kg/m   Exam: Well-appearing woman, nicely dressed and groomed.   Addressed today:  Controlled type 2 diabetes mellitus without  complication, without long-term current use of insulin  (HCC) Assessment & Plan: A1c today 6.6, decreased from 7.5 44M ago without any change in therapy except for the brief self-increased glimepiride  from 1 mg to 2 mg daily (now reduced back to 1 mg). She has no diabetic complications and is at low risk of developing any.  I did my best to reassure her though she is quite concerned with high readings.  Ideally she would not be on this long acting sulfonylurea due to risk of hypoglycemia in older adults.  It helps that she checks her fasting blood sugars daily.  Orders: -     POCT glycosylated hemoglobin (Hb A1C)  Essential hypertension Assessment & Plan: BP 142/54 on unchanged regimen amlodipine  10 mg and metoprolol  25 mg bid. At a recent [facility] visit SBP was in 120s; no changes needed.    Other orders -     Glucose, capillary   Recheck 44M

## 2024-05-20 NOTE — Patient Instructions (Addendum)
 Ms. Verner, Kopischke to see you doing so well today!  Your diabetes control is very tight, meaning even though you've been having some higher blood sugar readings, your A1C was 6.6, the lowest it's been in awhile!  Your goal A1C is less than 8.0.  This is an average of your blood glucose control over three months.  It would be safest to keep your glimepiride  at 1 tab a day.  The risk of higher dose is that your blood sugar could drop too low, and you might not feel it.    We won't make any changes in your treatment today, and you don't need any blood work at this visit. :)  Let's get together in three months (or earlier if needed - we are here)  Take care and stay well!  Dr. Trudy

## 2024-05-25 NOTE — Assessment & Plan Note (Signed)
 A1c today 6.6, decreased from 7.5 41M ago without any change in therapy except for the brief self-increased glimepiride  from 1 mg to 2 mg daily (now reduced back to 1 mg). She has no diabetic complications and is at low risk of developing any.  I did my best to reassure her though she is quite concerned with high readings.  Ideally she would not be on this long acting sulfonylurea due to risk of hypoglycemia in older adults.  It helps that she checks her fasting blood sugars daily.

## 2024-05-25 NOTE — Assessment & Plan Note (Addendum)
 BP 142/54 on unchanged regimen amlodipine  10 mg and metoprolol  25 mg bid. At a recent [facility] visit SBP was in 120s; no changes needed.

## 2024-06-02 DIAGNOSIS — E119 Type 2 diabetes mellitus without complications: Secondary | ICD-10-CM | POA: Diagnosis not present

## 2024-06-21 ENCOUNTER — Other Ambulatory Visit (HOSPITAL_COMMUNITY): Payer: Self-pay

## 2024-06-21 MED ORDER — APIXABAN 2.5 MG PO TABS: 2.5000 mg | ORAL_TABLET | Freq: Two times a day (BID) | ORAL | 11 refills | Status: AC

## 2024-07-20 ENCOUNTER — Encounter: Payer: Self-pay | Admitting: Internal Medicine

## 2024-07-20 ENCOUNTER — Encounter: Admitting: Student

## 2024-08-03 ENCOUNTER — Other Ambulatory Visit: Payer: Self-pay | Admitting: Internal Medicine

## 2024-08-03 DIAGNOSIS — F3342 Major depressive disorder, recurrent, in full remission: Secondary | ICD-10-CM

## 2024-08-03 NOTE — Telephone Encounter (Signed)
 Long term use chronic medication; has not tolerated taper off.

## 2024-09-08 NOTE — Progress Notes (Unsigned)
 89 year old Christine [name] is here routine follow-up of chronic conditions including HTN, COPD, CKD3, hypothyroidism, hx of giant cell arteritis/PMR, and a recent dx of A flutter-who had been hospitalized in 10/2023 for orthostatic hypotension and new onset atrial flutter.  She is by herself today, brought by her grandson (dtr Strandquist working today)  My last visit with her was in 05/2024.  Since last visit patient has seen her eye professional, Dr. Marty Ill. Her daughter contacted us  in [facility] last month to report that Christine [name] had been experiencing constipation; an appt was made for later that afternoon but it doesn't appaer to have materialized.  She increased fiber in diet and took fiber gummies and drank prune juice, and all resolved without incident.  L hand is causing pain; can't get to rheumatologist until 12/2024.  Sleeping well, good appetite, nocturia x 2, no UI, no balance concerns and no falls/near falls.    She brings her carefully documented daily measurements of weight, BP, and glucose.  Patient Active Problem List   Diagnosis Date Noted   Hx of Antidepressant discontinuation syndrome (TCA) 2018 02/17/2024   Cognitive impairment 02/01/2024   High risk medication use 12/15/2023   Hypercoagulable state due to paroxysmal atrial fibrillation (HCC) 11/06/2023   Atrial flutter (HCC) 10/28/2023   Primary osteoarthritis of both hands 05/08/2022   Gait instability 09/24/2020   Coronary artery disease involving native coronary artery of native heart 06/23/2020   Cervical radiculopathy at C7 06/23/2020   CKD stage 3 due to type 2 diabetes mellitus (HCC) 01/13/2019   Rectal prolapse 06/24/2018   Temporal arteritis (HCC) 04/06/2018   Rheumatic disease 07/29/2017   MDD (major depressive disorder), recurrent, in full remission 11/30/2016   Elevated alkaline phosphatase level 09/18/2016   Hypertensive retinopathy of both eyes, grade 2 04/20/2015   Lumbar spondylosis 06/16/2014   Carpal  tunnel syndrome 02/18/2014   Osteopenia 01/11/2013   Allergic rhinitis 02/13/2012   Hyperlipidemia 11/28/2006   Hypothyroidism 09/04/2006   Type 2 diabetes mellitus, controlled (HCC) 09/04/2006   History of migraine headaches 09/04/2006   Essential hypertension 09/04/2006   GERD 09/04/2006    Current Outpatient Medications:    acetaminophen  (TYLENOL ) 500 MG tablet, Take 1,000 mg by mouth as needed for headache. For pain, Disp: , Rfl:    amLODipine  (NORVASC ) 10 MG tablet, Take 1 tablet (10 mg total) by mouth daily., Disp: 90 tablet, Rfl: 3   apixaban  (ELIQUIS ) 2.5 MG TABS tablet, Take 1 tablet (2.5 mg total) by mouth 2 (two) times daily., Disp: 60 tablet, Rfl: 11   Ascorbic Acid  (VITAMIN C  PO), Take 2 tablets by mouth daily. , Disp: , Rfl:    atorvastatin  (LIPITOR) 40 MG tablet, TAKE 1/2 TABLET BY MOUTH EVERY EVENING, Disp: 45 tablet, Rfl: 3   Blood Glucose Monitoring Suppl (ONETOUCH VERIO REFLECT) w/Device KIT, Check blood sugar one time daily before breakfast, Disp: 1 kit, Rfl: 1   cholecalciferol 25 MCG (1000 UT) tablet, Take 1,000 Units by mouth daily., Disp: , Rfl:    famotidine  (PEPCID ) 20 MG tablet, TAKE 1 TABLET(20 MG) BY MOUTH TWICE DAILY. FOLLOW UP FOR FURTHER REFILLS. THANK YOU (Patient taking differently: Take 20 mg by mouth daily.), Disp: 180 tablet, Rfl: 2   fluticasone  (FLONASE ) 50 MCG/ACT nasal spray, SHAKE LIQUID AND USE 1 SPRAY IN EACH NOSTRIL EVERY DAY, Disp: 16 g, Rfl: 2   glimepiride  (AMARYL ) 1 MG tablet, TAKE 1 TABLET BY MOUTH EVERY DAY BEFORE BREAKFAST, Disp: 90 tablet, Rfl:  3   glucose blood (ONETOUCH VERIO) test strip, USE TO CHECK BLOOD SUGAR ONCE DAILY BEFORE BREAKFAST, Disp: 100 strip, Rfl: 2   imipramine  (TOFRANIL ) 50 MG tablet, TAKE 1 TABLET(50 MG) BY MOUTH TWICE DAILY, Disp: 180 tablet, Rfl: 3   Lancet Devices (ONETOUCH DELICA PLUS LANCING) MISC, Check blood sugar one time daily before breakfast, Disp: 1 each, Rfl: 5   Lancets (ONETOUCH DELICA PLUS LANCET33G)  MISC, USE TO TEST DAILY, Disp: 100 each, Rfl: 5   levothyroxine  (SYNTHROID ) 25 MCG tablet, TAKE 1 TABLET BY MOUTH EVERY DAY, Disp: 90 tablet, Rfl: 1   loratadine  (CLARITIN ) 10 MG tablet, Take 10 mg by mouth every evening., Disp: , Rfl:    metoprolol  tartrate (LOPRESSOR ) 25 MG tablet, Take 1 tablet (25 mg total) by mouth 2 (two) times daily., Disp: 180 tablet, Rfl: 3   Multiple Vitamin (MULTIVITAMIN WITH MINERALS) TABS tablet, Take 1 tablet by mouth daily., Disp: 30 tablet, Rfl: 2  Functional Status:  Ms. Franzen lives with her daughter, upstairs in an area complete with bathroom with walk-in shower and shower stool, microwave and mini fridge.  She manages her own ADLs, though sometimes needs assistance if her hand arthritis is problematic.  Her daughter assists her with making her bed, primarily due to problems gripping bed linens associated with arthritis.  She has a rollator which she does not use regularly.  She has had no sense of instability or fear of falling, and has not had no falls since prior to her hospitalization a few months ago.  She rarely ventures down the stairs on excursion primarily for doctor visits or going shopping together-she enjoys shopping at [employer].  Her daughter manages grocery shopping.  Ms. Stemmler checks her own blood sugar faithfully every morning and writes down recordings of her vital signs.   Objective BP (!) 142/52 (BP Location: Right Arm, Patient Position: Sitting, Cuff Size: Small)   Pulse (!) 55   Temp 97.6 F (36.4 C) (Oral)   Wt 124 lb 12.8 oz (56.6 kg)   LMP 02/11/1975 (Approximate)   SpO2 100%   BMI 21.42 kg/m   Exam: Lovely, well appearance. Walks w/o assistive device.  Some restriction of motion of neck, chronic.  No carotid bruits.  Heart RRR today (hx AFl) with 2/6 systolic murmurf base w/o radiation.  Lungs clear.  No LE edema.  Feet are warm and well kempt.  Reduced DP pulses bilaterally.  Skin turgor fair. Hands are cool and slightly pale, but radial  pulses strong.  Incomplete grips and finger extension due to arthritis.  Tingling in medial nerve distribution with percussion.  She isn't able to Phalens. No acute joint swelling, wrmth, or tenderness, though all finger joints are chronically thickened.    Assessment and Plan:   CKD stage 3 due to type 2 diabetes mellitus [facility]) Assessment & Plan: Lab today.   Orders: -     Comprehensive metabolic panel with GFR (also will be looking at liver enzymes; hx of past elevated alk phos)  Type 2 diabetes mellitus, controlled (HCC) Assessment & Plan: Continuing to check her fasting CBGs daily at home, with no low readings, reassuring in that she maintains treatment with glimeperide.  Very few readings above 150, all < 200.  A1c=6.9.  No change in treatment per patient preference.   Orders: -     POCT glycosylated hemoglobin (Hb A1C)  Elevated alkaline phosphatase level Assessment & Plan: Recheck today Orders: -     Comprehensive metabolic panel  with GFR  Carpal tunnel syndrome of left wrist -     Diclofenac Sodium; Apply 4 g topically 4 (four) times daily. Hand and wrist pain  Dispense: 100 g; Refill: 5  Gait instability Assessment & Plan: No falls or near falls.    Hypertension associated with type 2 diabetes mellitus (HCC) Assessment & Plan: BP 142/52 on unchanged regimen amlodipine  10 mg and metoprolol  25 mg bid. HR 50s. Asymptomatic. Diastolic is low, and increase in control not appropriate.  If bradycardia becomes symptomatic, BB will be reduced or stopped.    Coronary artery disease involving native coronary artery of native heart without angina pectoris Assessment & Plan: Asymptomatic; on statin, no antiplatelets due to bleeding risk on apixaban .  Rectal prolapse Assessment & Plan: She is managing ok.  Experienced a period of constipation last month which fortunately resolved with dietary/fiber/fluid interventions and no prolapse exacerbation.   Left hand pain Assessment &  Plan: Hx of carpal tunnel dxed 10 years ago, she's not sure if this feels the same.  Discomfort is primarily the palm and all palmar fingers.  No tenderness, increased warmth, or swelling on examination of wrist, MCPs, PIPs, DIPs, no thumb extensor pain.  NO soft tissue tenderness.  Could not get hands in position for Tinels test.  Phalens test positive.  With sxs limited to hand, and given history of CP, will treat as such; advised that she obtain a wrist splint for night use, prescribed Voltaren gel. I'll look into her chart to determine what other remedies have been successful, since its been many years.  No vascular or acute joint problems identified today. She has made an appt with rheumatologist for 12/2024.   Encounter for immunization -     Flu vaccine HIGH DOSE PF(Fluzone Trivalent)

## 2024-09-09 ENCOUNTER — Encounter: Payer: Self-pay | Admitting: Internal Medicine

## 2024-09-09 ENCOUNTER — Ambulatory Visit (INDEPENDENT_AMBULATORY_CARE_PROVIDER_SITE_OTHER): Payer: Self-pay | Admitting: Internal Medicine

## 2024-09-09 VITALS — BP 142/52 | HR 55 | Temp 97.6°F | Wt 124.8 lb

## 2024-09-09 DIAGNOSIS — R4189 Other symptoms and signs involving cognitive functions and awareness: Secondary | ICD-10-CM | POA: Diagnosis not present

## 2024-09-09 DIAGNOSIS — Z8679 Personal history of other diseases of the circulatory system: Secondary | ICD-10-CM

## 2024-09-09 DIAGNOSIS — G5602 Carpal tunnel syndrome, left upper limb: Secondary | ICD-10-CM | POA: Diagnosis not present

## 2024-09-09 DIAGNOSIS — R2681 Unsteadiness on feet: Secondary | ICD-10-CM | POA: Diagnosis not present

## 2024-09-09 DIAGNOSIS — E1159 Type 2 diabetes mellitus with other circulatory complications: Secondary | ICD-10-CM | POA: Diagnosis not present

## 2024-09-09 DIAGNOSIS — M79642 Pain in left hand: Secondary | ICD-10-CM | POA: Insufficient documentation

## 2024-09-09 DIAGNOSIS — Z23 Encounter for immunization: Secondary | ICD-10-CM | POA: Diagnosis not present

## 2024-09-09 DIAGNOSIS — R748 Abnormal levels of other serum enzymes: Secondary | ICD-10-CM

## 2024-09-09 DIAGNOSIS — E1122 Type 2 diabetes mellitus with diabetic chronic kidney disease: Secondary | ICD-10-CM

## 2024-09-09 DIAGNOSIS — I152 Hypertension secondary to endocrine disorders: Secondary | ICD-10-CM | POA: Diagnosis not present

## 2024-09-09 DIAGNOSIS — K623 Rectal prolapse: Secondary | ICD-10-CM

## 2024-09-09 DIAGNOSIS — E119 Type 2 diabetes mellitus without complications: Secondary | ICD-10-CM | POA: Diagnosis not present

## 2024-09-09 DIAGNOSIS — N183 Chronic kidney disease, stage 3 unspecified: Secondary | ICD-10-CM | POA: Diagnosis not present

## 2024-09-09 DIAGNOSIS — I2510 Atherosclerotic heart disease of native coronary artery without angina pectoris: Secondary | ICD-10-CM | POA: Diagnosis not present

## 2024-09-09 LAB — POCT GLYCOSYLATED HEMOGLOBIN (HGB A1C): HbA1c, POC (controlled diabetic range): 6.9 % (ref 0.0–7.0)

## 2024-09-09 LAB — GLUCOSE, CAPILLARY: Glucose-Capillary: 145 mg/dL — ABNORMAL HIGH (ref 70–99)

## 2024-09-09 MED ORDER — DICLOFENAC SODIUM 1 % EX GEL: 4.0000 g | Freq: Four times a day (QID) | CUTANEOUS | 5 refills | Status: AC

## 2024-09-09 NOTE — Assessment & Plan Note (Signed)
 She is managing ok.  Experienced a period of constipation last month which fortunately resolved with dietary/fiber/fluid interventions and no prolapse exacerbation.

## 2024-09-09 NOTE — Assessment & Plan Note (Signed)
 BP 142/52 on unchanged regimen amlodipine  10 mg and metoprolol  25 mg bid. HR 50s. Asymptomatic. Diastolic is low, and increase in control not appropriate.  If bradycardia becomes symptomatic, BB will be reduced or stopped.

## 2024-09-09 NOTE — Assessment & Plan Note (Signed)
 BMP today

## 2024-09-09 NOTE — Assessment & Plan Note (Signed)
 Continuing to check her fasting CBGs daily at home, with no low readings, reassuring in that she maintains treatment with glimeperide.  Very few readings above 150, all < 200.  A1c=6.9.  No change in treatment per patient preference.

## 2024-09-09 NOTE — Assessment & Plan Note (Signed)
 Recheck today

## 2024-09-09 NOTE — Patient Instructions (Signed)
 Wonderful to see you today, Christine [name]! Thank you for getting your flu shot.  Your diabetes control is wonderful, and I'm happy with your blood pressures.  Thank you for recording such detailed notes.  I know you're very careful about staying healthy!  I'll take a look at your medicines to make sure you don't run out before our next visit.  For your L hand pain, please look into finding a left wrist splint to wear at night to help reduce your carpal tunnel symptoms.  You may also use the Voltaren gel as often as four times a day to help with discomfort.  I wouldn't advise anything stronger than tylenol  for the pain.  I'll look into options for injection (as you've had in the past).  Take care and stay well!  I'll see you in 3 months.  Dr. Trudy

## 2024-09-09 NOTE — Assessment & Plan Note (Signed)
 Asymptomatic; on statin, no antiplatelets due to bleeding risk on apixaban .

## 2024-09-09 NOTE — Assessment & Plan Note (Signed)
 Hx of carpal tunnel dxed 10 years ago, she's not sure if this feels the same.  Discomfort is primarily the palm and all palmar fingers.  No tenderness, increased warmth, or swelling on examination of wrist, MCPs, PIPs, DIPs, no thumb extensor pain.  NO soft tissue tenderness.  Could not get hands in position for Tinels test.  Phalens test positive.  With sxs limited to hand, and given history of CP, will treat as such; advised that she obtain a wrist splint for night use, prescribed Voltaren gel. I'll look into her chart to determine what other remedies have been successful, since its been many years.  No vascular or acute joint problems identified today. She has made an appt with rheumatologist for 12/2024.

## 2024-09-09 NOTE — Assessment & Plan Note (Signed)
No falls or near-falls.

## 2024-09-11 LAB — COMPREHENSIVE METABOLIC PANEL WITH GFR
ALT: 24 IU/L (ref 0–32)
AST: 32 IU/L (ref 0–40)
Albumin: 4.1 g/dL (ref 3.7–4.7)
Alkaline Phosphatase: 176 IU/L — ABNORMAL HIGH (ref 48–129)
BUN/Creatinine Ratio: 26 (ref 12–28)
BUN: 24 mg/dL (ref 8–27)
Bilirubin Total: 0.4 mg/dL (ref 0.0–1.2)
CO2: 18 mmol/L — ABNORMAL LOW (ref 20–29)
Calcium: 9.8 mg/dL (ref 8.7–10.3)
Chloride: 101 mmol/L (ref 96–106)
Creatinine, Ser: 0.93 mg/dL (ref 0.57–1.00)
Globulin, Total: 3.5 g/dL (ref 1.5–4.5)
Glucose: 121 mg/dL — ABNORMAL HIGH (ref 70–99)
Potassium: 4.7 mmol/L (ref 3.5–5.2)
Sodium: 137 mmol/L (ref 134–144)
Total Protein: 7.6 g/dL (ref 6.0–8.5)
eGFR: 59 mL/min/1.73 — ABNORMAL LOW (ref >59)

## 2024-09-12 NOTE — Progress Notes (Unsigned)
 Cardiology Office Note:    Date:  09/13/2024   ID:  Christine [name], DOB 16-Dec-1932, MRN [id number]  PCP:  [name], MD   [facility] Providers Cardiologist:  Annabella Scarce, MD     Referring MD: [name], MD   Chief complaint: 55-month follow-up CAD     History of Present Illness:   Christine [name] is a 89 y.o. female with a hx of CAD, atrial flutter, hypertension with orthostatic hypotension, HLD, T2DM, PMR/GCA, COPD, GERD.  Established cardiac care 08/2011 following complain of chest pain.  LHC in 2013 and 2016 revealed nonobstructive CAD.  CPX 10/2015 revealed mild obstructive pulmonary disease and severe chronotropic incompetence.  48-hour Holter 03/2016 showing occasional PACs and PVCs.  Reported fatigue and exertional dyspnea 07/2017, referred for [facility].  R/LHC 08/12/2017: Nonobstructive CAD, unchanged from 2013 and 2016.  LM lesion 40% stenosed, ostial D1 70% stenosed, proximal RCA 10% stenosed, normal LV systolic function, normal LVEDP, EF 55-65% by visual estimate, normal right heart pressures.  Medical therapy recommended, symptoms of fatigue and dyspnea were not believed to be cardiac related at that time. Echo 11/07/2017: LVEF 65-70%, G1 DD  Diagnosed with temporal arteritis in 2019, following treatment for this for fatigue also improved, however continued to have dizziness.  She was doing well from a cardiovascular standpoint at her office visit 04/2021.  She was admitted 10/27/2023 for 2 days following orthostatic hypotension and new onset atrial flutter.  Started on the reduced dose of Eliquis  and restarted metoprolol .  Continue to take prednisone  as needed for her GCA/PMR flareups, noted to have secondary adrenal insufficiency due to chronic steroid use.  Echo 10/28/2023: LVEF 70-75%, LV with hyperdynamic function, no RWMA, mild concentric LVH, normal RV, LA mildly dilated, RA mildly dilated, trivial MV regurg, AV with indeterminate number of  cusps, AV sclerosis present without evidence of stenosis, RA pressure 3 mmHg.  Most recently evaluated in the A-fib clinic on 04/2024, patient was tolerating medications without difficulties and was otherwise without cardiac complaints during this visit.  Presents to the clinic today alone, very pleasant, doing well from a cardiovascular standpoint. She denies chest pain, palpitations, dyspnea, orthopnea, n, v, dark/tarry/bloody stools, hematuria, dizziness, syncope, edema, weight gain.  She lives with her daughter, walks unassisted, tries to exercise occasionally with her exercise pedals.  BPs average 120s-140s systolic.  Believes she has not had any recurrence of her atrial flutter.  No complaints at today's visit.  ROS:   Please see the history of present illness.    All other systems reviewed and are negative.     Past Medical History:  Diagnosis Date   Anemia    Anginal pain    Arthritis    back, arms, hips; hands (11/30/2015)   Benign hypertensive heart and kidney disease with diastolic CHF, NYHA class II and CKD stage III (HCC)    Blood transfusion 1972   after daughter born, attempted to give me blood; couldn't give it cause my blood was cold (09/23/2013)   CAD (coronary artery disease)    a. LHC 08/23/11: dLM 40-50%, oRI 40%, oCFX 40%, oD1 70% (small and not amenable to PCI).  LM lesion did not appear to be flow limiting.  Medical rx was recommended;  b. Echo 08/23/11: mild LVH, EF 60-65%, grade 1 diast dysfxn, mild BAE, PASP 24;  c. 02/2012  Cath: LM 50d, LAD 50p, D1 70ost, RI 70p, RCA ok->Med Rx;  d. 01/2013 Cardiolite : EF 88, no ischemia/infarct.  Carotid stenosis    a. dopplers 10/12:  0-39% bilat ICA;  b. 10/2012 U/S: 0-39% bilat, f/u 1 yr (10/2013).   Chronic bronchitis    Chronotropic incompetence 05/01/2021   Depression    Diverticulosis 09/18/2012   Noted scope 2009 and 2019 in the sigmoid.  No history of diverticulitis.      Diverticulosis of colon (without mention of  hemorrhage)    GERD (gastroesophageal reflux disease)    Heart murmur, systolic    2-D echo in December 2011 showed a normal EF with grade 1 diastolic dysfunction, trivial pulmonary regurgitation and mildly elevated PA pressure at 37 mmHg probably secondary to her COPD.dynamic obstruction-mid cavity obliteration;  b. 02/2012 Echo: EF 60-65%, mild LVH, PASP .   History of atrial flutter 10/28/2023   History of migraine headaches 09/04/2006   Migraine history since early life.   Long term imipramine  for suppression (> 40 years).  Developed depression with psychotic features and delirium when attempted to wean.  Sxs resolved when imipramine  resumed.  Plan is for continuation.  She identifies any headache as indicative of migraine.         History of stomach ulcers 1970's   History of temporal arteritis 04/06/2018   Hx of Antidepressant discontinuation syndrome (TCA) 2018 02/17/2024   Hyperlipidemia    Hypertension    Hypertension associated with type 2 diabetes mellitus (HCC) 09/04/2006   Hypothyroidism    Migraines    a. Next atypical symptoms in the past. Patient was started on Neurontin for possible neuropathic origin of her pain.   Polymyalgia rheumatica    Premature atrial contraction 05/01/2021   She remains asymptomatic.  No medication indicated.   Shortness of breath on exertion    just related to angina >1 yr ago (09/23/2013)   Sinus arrhythmia    Solitary pulmonary nodule 12/15/2006   CT Chest in January 2008  IMPRESSION:  1. Stable CT of the chest with mild biapical scarring and scattered nodularity, most likely postinflammatory in the absence of a history of malignancy.  2. No acute chest findings are demonstrated.  3. Unless the patient has a history of malignancy or risk factors for lung cancer, the chest findings do not necessarily require any specific followup. If follow    Type II diabetes mellitus (HCC)    a. On oral hypoglycemic agents.    Past Surgical History:   Procedure Laterality Date   CARDIAC CATHETERIZATION N/A 09/26/2015   Procedure: Right/Left Heart Cath and Coronary Angiography;  Surgeon: Ezra GORMAN Shuck, MD;  Location: [facility] INVASIVE CV LAB;  Service: Cardiovascular;  Laterality: N/A;   CATARACT EXTRACTION W/ INTRAOCULAR LENS  IMPLANT, BILATERAL Bilateral 1990's   LEFT HEART CATHETERIZATION WITH CORONARY ANGIOGRAM N/A 03/16/2012   Procedure: LEFT HEART CATHETERIZATION WITH CORONARY ANGIOGRAM;  Surgeon: Debby JONETTA Como, MD;  Location: [facility] CATH LAB;  Service: Cardiovascular;  Laterality: N/A;   RIGHT/LEFT HEART CATH AND CORONARY ANGIOGRAPHY N/A 08/12/2017   Procedure: RIGHT/LEFT HEART CATH AND CORONARY ANGIOGRAPHY;  Surgeon: Jordan, Peter M, MD;  Location: [facility] INVASIVE CV LAB;  Service: Cardiovascular;  Laterality: N/A;   VAGINAL HYSTERECTOMY  1976    Current Medications: Current Meds  Medication Sig   acetaminophen  (TYLENOL ) 500 MG tablet Take 1,000 mg by mouth as needed for headache. For pain   amLODipine  (NORVASC ) 10 MG tablet Take 1 tablet (10 mg total) by mouth daily.   apixaban  (ELIQUIS ) 2.5 MG TABS tablet Take 1 tablet (2.5 mg total) by mouth 2 (two)  times daily.   Ascorbic Acid  (VITAMIN C  PO) Take 2 tablets by mouth daily.    atorvastatin  (LIPITOR) 40 MG tablet TAKE 1/2 TABLET BY MOUTH EVERY EVENING (Patient taking differently: Take 40 mg by mouth every evening.)   Blood Glucose Monitoring Suppl (ONETOUCH VERIO REFLECT) w/Device KIT Check blood sugar one time daily before breakfast   cholecalciferol 25 MCG (1000 UT) tablet Take 1,000 Units by mouth daily.   diclofenac Sodium (VOLTAREN ARTHRITIS PAIN) 1 % GEL Apply 4 g topically 4 (four) times daily. Hand and wrist pain   famotidine  (PEPCID ) 20 MG tablet TAKE 1 TABLET(20 MG) BY MOUTH TWICE DAILY. FOLLOW UP FOR FURTHER REFILLS. THANK YOU (Patient taking differently: Take 20 mg by mouth daily.)   fluticasone  (FLONASE ) 50 MCG/ACT nasal spray SHAKE LIQUID AND USE 1 SPRAY IN EACH NOSTRIL EVERY  DAY   glimepiride  (AMARYL ) 1 MG tablet TAKE 1 TABLET BY MOUTH EVERY DAY BEFORE BREAKFAST   glucose blood (ONETOUCH VERIO) test strip USE TO CHECK BLOOD SUGAR ONCE DAILY BEFORE BREAKFAST   imipramine  (TOFRANIL ) 50 MG tablet TAKE 1 TABLET(50 MG) BY MOUTH TWICE DAILY   Lancet Devices (ONETOUCH DELICA PLUS LANCING) MISC Check blood sugar one time daily before breakfast   Lancets (ONETOUCH DELICA PLUS LANCET33G) MISC USE TO TEST DAILY   levothyroxine  (SYNTHROID ) 25 MCG tablet TAKE 1 TABLET BY MOUTH EVERY DAY   loratadine  (CLARITIN ) 10 MG tablet Take 10 mg by mouth every evening.   metoprolol  tartrate (LOPRESSOR ) 25 MG tablet Take 1 tablet (25 mg total) by mouth 2 (two) times daily.   Multiple Vitamin (MULTIVITAMIN WITH MINERALS) TABS tablet Take 1 tablet by mouth daily.     Allergies:   Aspirin , Atarax  [hydroxyzine ], Metoprolol , Nsaids, Codeine , Haldol  [haloperidol ], Penicillins, and Monosodium glutamate   Social History   Socioeconomic History   Marital status: Widowed    Spouse name: Not on file   Number of children: 7   Years of education: Not on file   Highest education level: Not on file  Occupational History   Occupation: retired  Tobacco Use   Smoking status: Never    Passive exposure: Past   Smokeless tobacco: Never  Vaping Use   Vaping status: Never Used  Substance and Sexual Activity   Alcohol  use: No    Comment: 11/29/2016 Last drink 1967   Drug use: No   Sexual activity: Not Currently  Other Topics Concern   Not on file  Social History Narrative   Never smoked. Lives in [place] with her dtr.  Care for her daughter who has had a lung transplant. Retired ambulance person.   Social Drivers of Corporate Investment Banker Strain: Low Risk  (05/16/2023)   Overall Financial Resource Strain (CARDIA)    Difficulty of Paying Living Expenses: Not very hard  Food Insecurity: No Food Insecurity (10/31/2023)   Hunger Vital Sign    Worried About Running Out of Food in the Last Year:  Never true    Ran Out of Food in the Last Year: Never true  Transportation Needs: No Transportation Needs (10/31/2023)   PRAPARE - Administrator, Civil Service (Medical): No    Lack of Transportation (Non-Medical): No  Physical Activity: Inactive (05/16/2023)   Exercise Vital Sign    Days of Exercise per Week: 0 days    Minutes of Exercise per Session: 0 min  Stress: No Stress Concern Present (05/16/2023)   [employer] of Occupational Health - Occupational Stress Questionnaire  Feeling of Stress : Only a little  Social Connections: Moderately Isolated (05/16/2023)   Social Connection and Isolation Panel    Frequency of Communication with Friends and Family: More than three times a week    Frequency of Social Gatherings with Friends and Family: More than three times a week    Attends Religious Services: 1 to 4 times per year    Active Member of [employer] or Organizations: No    Attends Banker Meetings: Never    Marital Status: Widowed     Family History: The patient's family history includes COPD in her father; Colon cancer in her maternal grandmother; Heart attack in her maternal grandmother; Other in her mother; Pulmonary Hypertension in her daughter.  EKGs/Labs/Other Studies Reviewed:    The following studies were reviewed today:  EKG Interpretation Date/Time:  Monday September 13 2024 10:16:40 EDT Ventricular Rate:  69 PR Interval:  180 QRS Duration:  82 QT Interval:  418 QTC Calculation: 447 R Axis:   -49  Text Interpretation: Sinus rhythm with Premature atrial complexes Pulmonary disease pattern Left anterior fascicular block Septal infarct (cited on or before 15-Sep-2023) No significant change since prior studies Confirmed by Elaine Moloney [phone]) on 09/13/2024 10:19:21 AM    Recent Labs: 10/27/2023: Magnesium  2.3 10/29/2023: Hemoglobin 13.0; Platelets 344 04/22/2024: TSH 3.000 09/09/2024: ALT 24; BUN 24; Creatinine, Ser 0.93; Potassium 4.7;  Sodium 137  Recent Lipid Panel    Component Value Date/Time   CHOL 143 05/16/2023 1026   TRIG 115 05/16/2023 1026   HDL 45 05/16/2023 1026   CHOLHDL 3.2 05/16/2023 1026   CHOLHDL 4 08/05/2014 0933   VLDL 33.8 08/05/2014 0933   LDLCALC 77 05/16/2023 1026     Risk Assessment/Calculations:    CHA2DS2-VASc Score = 6   This indicates a 9.7% annual risk of stroke. The patient's score is based upon: CHF History: 0 HTN History: 1 Diabetes History: 1 Stroke History: 0 Vascular Disease History: 1 Age Score: 2 Gender Score: 1         Physical Exam:    VS:  BP (!) 128/58 (BP Location: Left Arm, Patient Position: Sitting, Cuff Size: Normal)   Pulse (!) 54   Ht 5' 4 (1.626 m)   Wt 126 lb 3.2 oz (57.2 kg)   LMP 02/11/1975 (Approximate)   SpO2 97%   BMI 21.66 kg/m        Wt Readings from Last 3 Encounters:  09/13/24 126 lb 3.2 oz (57.2 kg)  09/09/24 124 lb 12.8 oz (56.6 kg)  05/20/24 126 lb 9.6 oz (57.4 kg)     GEN:  Well nourished, well developed in no acute distress HEENT: Normal NECK:  No carotid bruits CARDIAC:  S1-S2 normal, RRR, Systolic murmur 2nd IC space right of sternum, no rubs, gallops RESPIRATORY:  Clear to auscultation without rales, wheezing or rhonchi  MUSCULOSKELETAL:  No edema; No deformity  SKIN: Warm and dry NEUROLOGIC:  Alert and oriented x 3 PSYCHIATRIC:  Normal affect       Assessment & Plan Coronary artery disease of native artery of native heart with stable angina pectoris R/LHC 08/12/2017: Nonobstructive CAD, unchanged from 2013 and 2016.  LM lesion 40% stenosed, ostial D1 70% stenosed, proximal RCA 10% stenosed, normal right heart pressures.  Echo 10/28/2023: LVEF 70-75%, LV with hyperdynamic function, no RWMA, mild concentric LVH EKG: Sinus rhythm with premature atrial complexes, 69 bpm, pulmonary disease pattern, left anterior fascicular block, septal infarct (cited previously), no significant change since  prior studies Denies chest  pain, SOB, palpitations, fatigue, near-syncope, leg swelling Continue atorvastatin  20 mg daily Continue metoprolol  tartrate 25 mg twice daily No ASA considering OAC Atrial flutter, unspecified type (HCC) EKG: Sinus rhythm with PACs, as above Last evaluated by atrial fib clinic 05/11/2024, in NSR at that time CHADS2VASC score of 6  Denies missing any doses of her OAC  Continue Eliquis  2.5 mg twice daily, as this is the correct dose for patient's age (89), weight (56.6 kg), and creatinine (0.93) Continue metoprolol  to tartrate 25 mg twice daily Will order CBC for medication monitoring Primary hypertension BPs reported well-controlled at home  Averaging 120s-140s systolic Denies headaches, blurred vision, near-syncope, dizziness, leg swelling, chest pain.  Instructed to follow-up if any of these symptoms occur. Will allow for mild permissive hypertension to avoid orthostatic changes/dizziness given her age/OAC status Continue amlodipine  10 mg daily Continue metoprolol  tartrate 25 mg twice daily Hyperlipidemia LDL goal <70 Lipid panel 05/16/2023: Cholesterol 143, triglycerides 115, HDL 45, LDL 77 LFTs 09/09/2024: AST 32, ALT 24 Patient ate eggs for breakfast prior to coming Continue atorvastatin  20 mg daily Will order direct LDL for patient's convenience, so as not to return on a separate day for full fasting lipid panel.  Follow-up in 5-6 months with Dr. Raford, as patient misses seeing her          Medication Adjustments/Labs and Tests Ordered: Current medicines are reviewed at length with the patient today.  Concerns regarding medicines are outlined above.  Orders Placed This Encounter  Procedures   CBC   LDL cholesterol, direct   EKG 12-Lead   No orders of the defined types were placed in this encounter.   Patient Instructions  Medication Instructions:   Your physician recommends that you continue on your current medications as directed. Please refer to the Current  Medication list given to you today.  *If you need a refill on your cardiac medications before your next appointment, please call your pharmacy*  Lab Work:  TODAY ON THE 3RD FLOOR [address]--LDL DIRECT AND CBC  If you have labs (blood work) drawn today and your tests are completely normal, you will receive your results only by: MyChart Message (if you have MyChart) OR A paper copy in the mail If you have any lab test that is abnormal or we need to change your treatment, we will call you to review the results.    Follow-Up:  5 MONTHS WITH DR. Pastura           Signed, Miriam FORBES Shams, NP  09/13/2024 10:23 AM    [facility]

## 2024-09-13 ENCOUNTER — Ambulatory Visit (INDEPENDENT_AMBULATORY_CARE_PROVIDER_SITE_OTHER): Admitting: Emergency Medicine

## 2024-09-13 ENCOUNTER — Encounter (HOSPITAL_BASED_OUTPATIENT_CLINIC_OR_DEPARTMENT_OTHER): Payer: Self-pay | Admitting: Emergency Medicine

## 2024-09-13 VITALS — BP 128/58 | HR 54 | Ht 64.0 in | Wt 126.2 lb

## 2024-09-13 DIAGNOSIS — I25118 Atherosclerotic heart disease of native coronary artery with other forms of angina pectoris: Secondary | ICD-10-CM | POA: Diagnosis not present

## 2024-09-13 DIAGNOSIS — E785 Hyperlipidemia, unspecified: Secondary | ICD-10-CM

## 2024-09-13 DIAGNOSIS — I10 Essential (primary) hypertension: Secondary | ICD-10-CM

## 2024-09-13 DIAGNOSIS — I4892 Unspecified atrial flutter: Secondary | ICD-10-CM

## 2024-09-13 LAB — CBC
Hematocrit: 38.4 % (ref 34.0–46.6)
Hemoglobin: 12.2 g/dL (ref 11.1–15.9)
MCH: 29.1 pg (ref 26.6–33.0)
MCHC: 31.8 g/dL (ref 31.5–35.7)
MCV: 92 fL (ref 79–97)
Platelets: 269 x10E3/uL (ref 150–450)
RBC: 4.19 x10E6/uL (ref 3.77–5.28)
RDW: 13.7 % (ref 11.7–15.4)
WBC: 4.7 x10E3/uL (ref 3.4–10.8)

## 2024-09-13 NOTE — Patient Instructions (Signed)
 Medication Instructions:   Your physician recommends that you continue on your current medications as directed. Please refer to the Current Medication list given to you today.  *If you need a refill on your cardiac medications before your next appointment, please call your pharmacy*  Lab Work:  TODAY ON THE 3RD FLOOR [address]--LDL DIRECT AND CBC  If you have labs (blood work) drawn today and your tests are completely normal, you will receive your results only by: MyChart Message (if you have MyChart) OR A paper copy in the mail If you have any lab test that is abnormal or we need to change your treatment, we will call you to review the results.    Follow-Up:  5 MONTHS WITH DR. [place]

## 2024-09-13 NOTE — Assessment & Plan Note (Addendum)
 R/LHC 08/12/2017: Nonobstructive CAD, unchanged from 2013 and 2016.  LM lesion 40% stenosed, ostial D1 70% stenosed, proximal RCA 10% stenosed, normal right heart pressures.  Echo 10/28/2023: LVEF 70-75%, LV with hyperdynamic function, no RWMA, mild concentric LVH EKG: Sinus rhythm with premature atrial complexes, 69 bpm, pulmonary disease pattern, left anterior fascicular block, septal infarct (cited previously), no significant change since prior studies Denies chest pain, SOB, palpitations, fatigue, near-syncope, leg swelling Continue atorvastatin  20 mg daily Continue metoprolol  tartrate 25 mg twice daily No ASA considering OAC

## 2024-09-14 ENCOUNTER — Ambulatory Visit: Payer: Self-pay | Admitting: Emergency Medicine

## 2024-09-14 LAB — LDL CHOLESTEROL, DIRECT: LDL Direct: 78 mg/dL (ref 0–99)

## 2024-09-14 NOTE — Progress Notes (Signed)
 Spoke with patient regarding results. No questions at this time. Will continue current medication regimen.

## 2024-10-06 ENCOUNTER — Ambulatory Visit

## 2024-10-06 VITALS — BP 126/60 | HR 80 | Temp 97.6°F | Ht 64.0 in | Wt 126.8 lb

## 2024-10-06 DIAGNOSIS — Z0000 Encounter for general adult medical examination without abnormal findings: Secondary | ICD-10-CM | POA: Diagnosis not present

## 2024-10-06 NOTE — Patient Instructions (Signed)
 [name],  Thank you for taking the time for your Medicare Wellness Visit. I appreciate your continued commitment to your health goals. Please review the care plan we discussed, and feel free to reach out if I can assist you further.  Please note that Annual Wellness Visits do not include a physical exam. Some assessments may be limited, especially if the visit was conducted virtually. If needed, we may recommend an in-person follow-up with your provider.  Ongoing Care Seeing your primary care provider every 3 to 6 months helps us  monitor your health and provide consistent, personalized care.   Referrals If a referral was made during today's visit and you haven't received any updates within two weeks, please contact the referred provider directly to check on the status.  Recommended Screenings:  Health Maintenance  Topic Date Due   Zoster (Shingles) Vaccine (1 of 2) Never done   DTaP/Tdap/Td vaccine (2 - Tdap) 09/19/2019   Medicare Annual Wellness Visit  05/15/2024   COVID-19 Vaccine (4 - 2025-26 season) 07/19/2024   Complete foot exam   07/29/2024   Hemoglobin A1C  03/10/2025   Eye exam for diabetics  06/02/2025   Lipid (cholesterol) test  09/13/2025   Pneumococcal Vaccine for age over 69  Completed   Flu Shot  Completed   DEXA scan (bone density measurement)  Completed   Meningitis B Vaccine  Aged Out       04/22/2024    3:06 PM  Advanced Directives  Does Patient Have a Medical Advance Directive? No  Would patient like information on creating a medical advance directive? No - Patient declined    Vision: Annual vision screenings are recommended for early detection of glaucoma, cataracts, and diabetic retinopathy. These exams can also reveal signs of chronic conditions such as diabetes and high blood pressure.  Dental: Annual dental screenings help detect early signs of oral cancer, gum disease, and other conditions linked to overall health, including heart disease and  diabetes.  Please see the attached documents for additional preventive care recommendations.

## 2024-10-06 NOTE — Progress Notes (Signed)
 Chief Complaint  Patient presents with   Medicare Wellness    SUBSEQUENT     Subjective:   Christine [name] is a 89 y.o. female who presents for a Medicare Annual Wellness Visit.  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 LHC 08/23/11: dLM 40-50%, oRI 40%, oCFX 40%, oD1 70% (small and not amenable to PCI).  LM lesion did not appear to be flow limiting.  Medical rx was recommended;  b. Echo 08/23/11: mild LVH, EF 60-65%, grade 1 diast dysfxn, mild BAE, PASP 24;  c. 02/2012  Cath: LM 50d, LAD 50p, D1 70ost, RI 70p, RCA ok->Med Rx;  d. 01/2013 Cardiolite : EF 88, no ischemia/infarct.   Carotid stenosis    a. dopplers 10/12:  0-39% bilat ICA;  b. 10/2012 U/S: 0-39% bilat, f/u 1 yr (10/2013).   Chronic bronchitis    Chronotropic incompetence 05/01/2021   Depression    Diverticulosis 09/18/2012   Noted scope 2009 and 2019 in the sigmoid.  No history of diverticulitis.      Diverticulosis of colon (without mention of hemorrhage)    GERD (gastroesophageal reflux disease)    Heart murmur, systolic    2-D echo in December 2011 showed a normal EF with grade 1 diastolic dysfunction, trivial pulmonary regurgitation and mildly elevated PA pressure at 37 mmHg probably secondary to her COPD.dynamic obstruction-mid cavity obliteration;  b. 02/2012 Echo: EF 60-65%, mild LVH, PASP .   History of atrial flutter 10/28/2023   History of migraine headaches 09/04/2006   Migraine history since early life.    Long term imipramine  for suppression (> 40 years).  Developed depression with psychotic features and delirium when attempted to wean.  Sxs resolved when imipramine  resumed.  Plan is for continuation.  She identifies any headache as indicative of migraine.         History of stomach ulcers 1970's   History of temporal arteritis 04/06/2018   Hx of Antidepressant discontinuation syndrome (TCA) 2018 02/17/2024   Hyperlipidemia    Hypertension    Hypertension associated with type 2 diabetes mellitus (HCC) 09/04/2006   Hypothyroidism    Migraines    a. Next atypical symptoms in the past. Patient was started on Neurontin for possible neuropathic origin of her pain.   Polymyalgia rheumatica    Premature atrial contraction 05/01/2021   She remains asymptomatic.  No medication indicated.   Shortness of breath on exertion    just related to angina >1 yr ago (09/23/2013)   Sinus arrhythmia    Solitary pulmonary nodule 12/15/2006   CT Chest in January 2008  IMPRESSION:  1. Stable CT of the chest with mild biapical scarring and scattered nodularity, most likely postinflammatory in the absence of a history of malignancy.  2. No acute chest findings are demonstrated.  3. Unless the patient has a history of malignancy or risk factors for lung cancer, the chest findings do not necessarily require any specific followup. If follow    Type  II diabetes mellitus (HCC)    a. On oral hypoglycemic agents.   Past Surgical History:  Procedure Laterality Date   CARDIAC CATHETERIZATION N/A 09/26/2015   Procedure: Right/Left Heart Cath and Coronary Angiography;  Surgeon: Ezra GORMAN Shuck, MD;  Location: [facility] INVASIVE CV LAB;  Service: Cardiovascular;  Laterality: N/A;   CATARACT EXTRACTION W/ INTRAOCULAR LENS  IMPLANT, BILATERAL Bilateral 1990's   LEFT HEART CATHETERIZATION WITH CORONARY ANGIOGRAM N/A 03/16/2012   Procedure: LEFT HEART CATHETERIZATION WITH CORONARY ANGIOGRAM;  Surgeon: Debby JONETTA Como, MD;  Location: [facility]  CATH LAB;  Service: Cardiovascular;  Laterality: N/A;   RIGHT/LEFT HEART CATH AND CORONARY ANGIOGRAPHY N/A 08/12/2017   Procedure: RIGHT/LEFT HEART CATH AND CORONARY ANGIOGRAPHY;  Surgeon: Jordan, Peter M, MD;  Location: [facility] INVASIVE CV LAB;  Service: Cardiovascular;  Laterality: N/A;   VAGINAL HYSTERECTOMY  1976   Family History  Problem Relation Age of Onset   Colon cancer Maternal Grandmother    Heart attack Maternal Grandmother    COPD Father    Other Mother        died of unknown causes in her 1's. Pt raised by grandmother.   Pulmonary Hypertension Daughter    Social History   Occupational History   Occupation: retired  Tobacco Use   Smoking status: Never    Passive exposure: Past   Smokeless tobacco: Never  Vaping Use   Vaping status: Never Used  Substance and Sexual Activity   Alcohol  use: No    Comment: 11/29/2016 Last drink 1967   Drug use: No   Sexual activity: Not Currently   Tobacco Counseling Counseling given: Not Answered  SDOH Screenings   Food Insecurity: No Food Insecurity (10/06/2024)  Housing: Low Risk  (10/06/2024)  Transportation Needs: No Transportation Needs (10/06/2024)  Utilities: Not At Risk (10/06/2024)  Alcohol  Screen: Low Risk  (05/16/2023)  Depression (PHQ2-9): Low Risk  (10/06/2024)  Financial Resource Strain: Low Risk  (05/16/2023)  Physical Activity: Insufficiently Active (10/06/2024)  Social Connections: Moderately Isolated (10/06/2024)  Stress: No Stress Concern Present (10/06/2024)  Tobacco Use: Low Risk  (10/06/2024)  Health Literacy: Adequate Health Literacy (10/06/2024)   See flowsheets for full screening details  Depression Screen PHQ 2 & 9 Depression Scale- Over the past 2 weeks, how often have you been bothered by any of the following problems? Little interest or pleasure in doing things: 0 Feeling down, depressed, or hopeless (PHQ Adolescent also includes...irritable): 0 PHQ-2 Total Score: 0 Trouble falling or staying  asleep, or sleeping too much: 0 (SLEEP WELL, TAKES NAPS DURING THE DAY, VOID 1-2 AT NIGHT) Feeling tired or having little energy: 0 Poor appetite or overeating (PHQ Adolescent also includes...weight loss): 0 Feeling bad about yourself - or that you are a failure or have let yourself or your family down: 0 Trouble concentrating on things, such as reading the newspaper or watching television (PHQ Adolescent also includes...like school work): 0 Moving or speaking so slowly that other people could have noticed. Or the opposite - being so fidgety or restless that you have been moving around a lot more than usual: 0 Thoughts that you would be better off dead, or of hurting yourself in some way: 0 PHQ-9 Total Score: 0 If you checked off any problems, how difficult have these problems made it for you to do your work, take care of things at home, or get along with other people?: Not difficult at all  Depression Treatment Depression Interventions/Treatment : EYV7-0 Score <4 Follow-up Not Indicated  Goals Addressed             This Visit's Progress    10/06/2024: To maintain the best I can.         Visit info / Clinical Intake: Medicare Wellness Visit Type:: Subsequent Annual Wellness Visit Persons participating in visit:: patient Medicare Wellness Visit Mode:: In-person (required for WTM) Information given by:: patient Interpreter Needed?: No Pre-visit prep was completed: yes AWV questionnaire completed by patient prior to visit?: no Living arrangements:: with family/others (LIVES WITH DAUGHTER) Patient's Overall Health Status Rating: good Typical amount of pain: none Does pain affect daily life?: no Are you currently prescribed opioids?: no  Dietary Habits and Nutritional Risks How many meals a day?: 3 (SNACKS) Eats fruit and vegetables daily?: yes Most meals are obtained by: having others provide food (DAUGHTER COOKS) In the last 2 weeks, have you had any of the following?:  none Diabetic:: (!) yes Any non-healing wounds?: no How often do you check your BS?: 1 Would you like to be referred to a Nutritionist or for Diabetic Management? : no  Functional Status Activities of Daily Living (to include ambulation/medication): (!) Needs Assist Feeding: Independent Dressing/Grooming: Independent Bathing: Independent Toileting: Independent Transfer: Independent Ambulation: Independent with device- listed below Home Assistive Devices/Equipment: Eyeglasses; Elevated toliet seat; Shower/tub chair Medication Administration: Independent Home Management: Independent Manage your own finances?: (!) no Primary transportation is: family/friends Concerns about vision?: no *vision screening is required for WTM* Concerns about hearing?: no  Fall Screening Falls in the past year?: 0 Number of falls in past year: 0 Was there an injury with Fall?: 0 Fall Risk Category Calculator: 0 Patient Fall Risk Level: Low Fall Risk  Fall Risk Patient at Risk for Falls Due to: No Fall Risks Fall risk Follow up: Falls evaluation completed; Education provided  Home and Transportation Safety: All rugs have non-skid backing?: N/A, no rugs All stairs or steps have railings?: yes Grab bars in the bathtub or shower?: yes Have non-skid surface in bathtub or shower?: yes Good home lighting?: yes Regular seat belt use?: yes Hospital stays in the last year:: (!) yes How many hospital stays:: 3 Reason: A FIB  Cognitive Assessment Difficulty concentrating, remembering, or making decisions? : yes (COGNITIVE IMPAIRMENT) Will 6CIT or Mini Cog be Completed: no 6CIT or Mini Cog Declined: patient has a diagnosis of dementia or cognitive impairment  Advance Directives (For Healthcare) Does Patient Have a Medical Advance Directive?: No Would patient like information on creating a medical advance directive?: No - Patient declined  Reviewed/Updated  Reviewed/Updated: Reviewed All (Medical,  Surgical, Family, Medications, Allergies, Care Teams, Patient Goals)        Objective:    Today's Vitals   10/06/24 0913  BP: 126/60  Pulse: 80  Temp: 97.6 F (36.4 C)  TempSrc: Temporal  SpO2: 91%  Weight: 126 lb 12.8 oz (57.5 kg)  Height: 5' 4 (1.626 m)  PainSc: 0-No pain   Body mass index is 21.77 kg/m.  Current Medications (verified) Outpatient Encounter Medications as of 10/06/2024  Medication Sig   acetaminophen  (TYLENOL ) 500 MG tablet Take 1,000 mg by mouth as needed for headache. For pain   amLODipine  (NORVASC ) 10 MG tablet Take 1 tablet (10 mg total) by mouth daily.   apixaban  (ELIQUIS ) 2.5 MG TABS tablet Take 1 tablet (2.5 mg total) by mouth 2 (two) times daily.   Ascorbic Acid  (VITAMIN C  PO) Take 2 tablets by mouth daily.    atorvastatin  (LIPITOR) 40 MG tablet TAKE  1/2 TABLET BY MOUTH EVERY EVENING (Patient taking differently: Take 40 mg by mouth every evening.)   Blood Glucose Monitoring Suppl (ONETOUCH VERIO REFLECT) w/Device KIT Check blood sugar one time daily before breakfast   cholecalciferol 25 MCG (1000 UT) tablet Take 1,000 Units by mouth daily.   diclofenac Sodium (VOLTAREN ARTHRITIS PAIN) 1 % GEL Apply 4 g topically 4 (four) times daily. Hand and wrist pain   famotidine  (PEPCID ) 20 MG tablet TAKE 1 TABLET(20 MG) BY MOUTH TWICE DAILY. FOLLOW UP FOR FURTHER REFILLS. THANK YOU (Patient taking differently: Take 20 mg by mouth daily.)   fluticasone  (FLONASE ) 50 MCG/ACT nasal spray SHAKE LIQUID AND USE 1 SPRAY IN EACH NOSTRIL EVERY DAY   glimepiride  (AMARYL ) 1 MG tablet TAKE 1 TABLET BY MOUTH EVERY DAY BEFORE BREAKFAST   glucose blood (ONETOUCH VERIO) test strip USE TO CHECK BLOOD SUGAR ONCE DAILY BEFORE BREAKFAST   imipramine  (TOFRANIL ) 50 MG tablet TAKE 1 TABLET(50 MG) BY MOUTH TWICE DAILY   Lancet Devices (ONETOUCH DELICA PLUS LANCING) MISC Check blood sugar one time daily before breakfast   Lancets (ONETOUCH DELICA PLUS LANCET33G) MISC USE TO TEST DAILY    levothyroxine  (SYNTHROID ) 25 MCG tablet TAKE 1 TABLET BY MOUTH EVERY DAY   loratadine  (CLARITIN ) 10 MG tablet Take 10 mg by mouth every evening.   metoprolol  tartrate (LOPRESSOR ) 25 MG tablet Take 1 tablet (25 mg total) by mouth 2 (two) times daily.   Multiple Vitamin (MULTIVITAMIN WITH MINERALS) TABS tablet Take 1 tablet by mouth daily.   No facility-administered encounter medications on file as of 10/06/2024.   Hearing/Vision screen Hearing Screening - Comments:: Patient wears hearing aids. Vision Screening - Comments:: Patient wears reading glasses. Patient goes to [facility]. Immunizations and Health Maintenance Health Maintenance  Topic Date Due   Zoster Vaccines- Shingrix (1 of 2) Never done   DTaP/Tdap/Td (2 - Tdap) 09/19/2019   COVID-19 Vaccine (4 - 2025-26 season) 07/19/2024   FOOT EXAM  07/29/2024   HEMOGLOBIN A1C  03/10/2025   OPHTHALMOLOGY EXAM  06/02/2025   LIPID PANEL  09/13/2025   Medicare Annual Wellness (AWV)  10/06/2025   Pneumococcal Vaccine: 50+ Years  Completed   Influenza Vaccine  Completed   DEXA SCAN  Completed   Meningococcal B Vaccine  Aged Out        Assessment/Plan:  This is a routine wellness examination for [facility].  Patient Care Team: [name], MD as PCP - General (Internal Medicine) Raford Riggs, MD as PCP - Cardiology (Cardiology) Oman, Heather, OD as Consulting Physician (Optometry)  I have personally reviewed and noted the following in the patient's chart:   Medical and social history Use of alcohol , tobacco or illicit drugs  Current medications and supplements including opioid prescriptions. Functional ability and status Nutritional status Physical activity Advanced directives List of other physicians Hospitalizations, surgeries, and ER visits in previous 12 months Vitals Screenings to include cognitive, depression, and falls Referrals and appointments  No orders of the defined types were placed in this  encounter.  In addition, I have reviewed and discussed with patient certain preventive protocols, quality metrics, and best practice recommendations. A written personalized care plan for preventive services as well as general preventive health recommendations were provided to patient.   Roz LOISE Fuller, LPN   [id number]   Return in about 1 year (around 10/06/2025) for Medicare wellness.  After Visit Summary: (MyChart) Due to this being a telephonic visit, the after visit summary with patients personalized  plan was offered to patient via MyChart   Nurse Notes: Patient is due for the following care gaps-Dtap, Foot Exam, Shingrix and Covid vaccines.

## 2024-10-20 ENCOUNTER — Other Ambulatory Visit: Payer: Self-pay

## 2024-10-20 DIAGNOSIS — E119 Type 2 diabetes mellitus without complications: Secondary | ICD-10-CM

## 2024-10-20 MED ORDER — ONETOUCH VERIO VI STRP: ORAL_STRIP | 2 refills | Status: AC

## 2024-10-20 NOTE — Telephone Encounter (Signed)
 Medication sent to pharmacy

## 2024-10-25 ENCOUNTER — Other Ambulatory Visit: Payer: Self-pay | Admitting: Student

## 2024-10-25 DIAGNOSIS — E119 Type 2 diabetes mellitus without complications: Secondary | ICD-10-CM

## 2024-10-26 NOTE — Telephone Encounter (Signed)
 Medication sent to pharmacy

## 2024-11-01 ENCOUNTER — Other Ambulatory Visit: Payer: Self-pay

## 2024-11-01 DIAGNOSIS — E039 Hypothyroidism, unspecified: Secondary | ICD-10-CM

## 2024-11-01 MED ORDER — LEVOTHYROXINE SODIUM 25 MCG PO TABS: 25.0000 ug | ORAL_TABLET | Freq: Every day | ORAL | 1 refills | Status: AC

## 2024-11-02 ENCOUNTER — Other Ambulatory Visit: Payer: Self-pay | Admitting: Student

## 2024-11-02 ENCOUNTER — Telehealth: Payer: Self-pay | Admitting: *Deleted

## 2024-11-02 DIAGNOSIS — K219 Gastro-esophageal reflux disease without esophagitis: Secondary | ICD-10-CM

## 2024-11-07 ENCOUNTER — Encounter: Payer: Self-pay | Admitting: Internal Medicine

## 2024-11-07 DIAGNOSIS — E785 Hyperlipidemia, unspecified: Secondary | ICD-10-CM

## 2024-11-08 ENCOUNTER — Other Ambulatory Visit: Payer: Self-pay | Admitting: Internal Medicine

## 2024-11-08 ENCOUNTER — Ambulatory Visit (HOSPITAL_COMMUNITY)
Admission: RE | Admit: 2024-11-08 | Discharge: 2024-11-08 | Disposition: A | Discharge status: Home / Self Care | Source: Ambulatory Visit | Attending: Internal Medicine | Admitting: Internal Medicine

## 2024-11-08 ENCOUNTER — Encounter (HOSPITAL_COMMUNITY): Payer: Self-pay | Admitting: Internal Medicine

## 2024-11-08 VITALS — BP 122/60 | HR 57 | Ht 64.0 in | Wt 126.4 lb

## 2024-11-08 DIAGNOSIS — I480 Paroxysmal atrial fibrillation: Secondary | ICD-10-CM | POA: Diagnosis not present

## 2024-11-08 DIAGNOSIS — D6869 Other thrombophilia: Secondary | ICD-10-CM | POA: Diagnosis not present

## 2024-11-08 DIAGNOSIS — I4892 Unspecified atrial flutter: Secondary | ICD-10-CM | POA: Insufficient documentation

## 2024-11-08 DIAGNOSIS — E782 Mixed hyperlipidemia: Secondary | ICD-10-CM

## 2024-11-08 DIAGNOSIS — I4891 Unspecified atrial fibrillation: Secondary | ICD-10-CM | POA: Diagnosis not present

## 2024-11-08 MED ORDER — ATORVASTATIN CALCIUM 20 MG PO TABS: 20.0000 mg | ORAL_TABLET | Freq: Every day | ORAL | 3 refills | Status: AC

## 2024-11-08 NOTE — Progress Notes (Signed)
 "   Primary Care Physician: [name], MD Primary Cardiologist: Annabella Scarce, MD Electrophysiologist: None     Referring Physician: Dr. Kate Marcos Christine [name] is a 89 y.o. female with a history of COPD, CAD, HTN, T2DM, hypothyroidism, giant cell arteritis, PMR, and paroxysmal atrial flutter who presents for consultation in the [facility]. Hospital admission 12/9-11/24 for lightheadedness found to be in new onset atrial flutter. Discharged on Lopressor  25 mg BID. Plavix  stopped. Patient is on Eliquis  2.5 mg BID for a CHADS2VASC score of 6.  Follow-up 11/08/2024, patient is currently in NSR.  She has had overall very low A-fib burden since last office visit. She missed a dose of metoprolol  and Eliquis  yesterday evening by accident. No bleeding issues on Eliquis .  Today, she denies symptoms of palpitations, chest pain, shortness of breath, orthopnea, PND, lower extremity edema, dizziness, presyncope, syncope, snoring, daytime somnolence, bleeding, or neurologic sequela. The patient is tolerating medications without difficulties and is otherwise without complaint today.   she has a BMI of Body mass index is 21.7 kg/m.SABRA Filed Weights   11/08/24 1043  Weight: 57.3 kg    Current Outpatient Medications  Medication Sig Dispense Refill   acetaminophen  (TYLENOL ) 500 MG tablet Take 1,000 mg by mouth as needed for headache. For pain     amLODipine  (NORVASC ) 10 MG tablet Take 1 tablet (10 mg total) by mouth daily. 90 tablet 3   apixaban  (ELIQUIS ) 2.5 MG TABS tablet Take 1 tablet (2.5 mg total) by mouth 2 (two) times daily. 60 tablet 11   Ascorbic Acid  (VITAMIN C  PO) Take 2 tablets by mouth daily.      atorvastatin  (LIPITOR) 20 MG tablet Take 1 tablet (20 mg total) by mouth daily. 90 tablet 3   Blood Glucose Monitoring Suppl (ONETOUCH VERIO REFLECT) w/Device KIT Check blood sugar one time daily before breakfast 1 kit 1   cholecalciferol 25 MCG (1000  UT) tablet Take 1,000 Units by mouth daily.     diclofenac  Sodium (VOLTAREN  ARTHRITIS PAIN) 1 % GEL Apply 4 g topically 4 (four) times daily. Hand and wrist pain 100 g 5   famotidine  (PEPCID ) 20 MG tablet Take 1 tablet (20 mg total) by mouth daily. 90 tablet 3   fluticasone  (FLONASE ) 50 MCG/ACT nasal spray SHAKE LIQUID AND USE 1 SPRAY IN EACH NOSTRIL EVERY DAY 16 g 2   glimepiride  (AMARYL ) 1 MG tablet TAKE 1 TABLET BY MOUTH EVERY DAY BEFORE BREAKFAST 90 tablet 3   glucose blood (ONETOUCH VERIO) test strip USE TO CHECK BLOOD SUGAR ONCE DAILY BEFORE BREAKFAST 100 strip 2   imipramine  (TOFRANIL ) 50 MG tablet TAKE 1 TABLET(50 MG) BY MOUTH TWICE DAILY 180 tablet 3   Lancet Devices (ONETOUCH DELICA PLUS LANCING) MISC Check blood sugar one time daily before breakfast 1 each 5   Lancets (ONETOUCH DELICA PLUS LANCET33G) MISC USE TO TEST DAILY 100 each 5   levothyroxine  (SYNTHROID ) 25 MCG tablet Take 1 tablet (25 mcg total) by mouth daily. 90 tablet 1   loratadine  (CLARITIN ) 10 MG tablet Take 10 mg by mouth every evening.     metoprolol  tartrate (LOPRESSOR ) 25 MG tablet Take 1 tablet (25 mg total) by mouth 2 (two) times daily. 180 tablet 3   Multiple Vitamin (MULTIVITAMIN WITH MINERALS) TABS tablet Take 1 tablet by mouth daily. 30 tablet 2   No current facility-administered medications for this encounter.    Atrial Fibrillation Management history:  Previous antiarrhythmic  drugs: none Previous cardioversions: none Previous ablations: none Anticoagulation history: Eliquis  2.5 mg BID   ROS- All systems are reviewed and negative except as per the HPI above.  Physical Exam: BP 122/60   Pulse (!) 57   Ht 5' 4 (1.626 m)   Wt 57.3 kg   LMP 02/11/1975   BMI 21.70 kg/m   GEN- The patient is well appearing, alert and oriented x 3 today.   Neck - no JVD or carotid bruit noted Lungs- Clear to ausculation bilaterally, normal work of breathing Heart- Regular rate and rhythm, no murmurs, rubs or  gallops, PMI not laterally displaced Extremities- no clubbing, cyanosis, or edema Skin - no rash or ecchymosis noted   EKG today demonstrates  EKG Interpretation Date/Time:  Monday November 08 2024 10:46:47 EST Ventricular Rate:  57 PR Interval:  202 QRS Duration:  92 QT Interval:  446 QTC Calculation: 434 R Axis:   -27  Text Interpretation: Sinus bradycardia Left anterior fascicular block Anteroseptal infarct (cited on or before 15-Sep-2023) Abnormal ECG When compared with ECG of 13-Sep-2024 10:16, Premature atrial complexes are no longer Present Confirmed by Terra Pac ([id number]) on 11/08/2024 11:03:26 AM    Echo 10/28/23 demonstrated  1. Left ventricular ejection fraction, by estimation, is 70 to 75%. Left  ventricular ejection fraction by PLAX is 73 %. The left ventricle has  hyperdynamic function. The left ventricle has no regional wall motion  abnormalities. There is mild concentric  left ventricular hypertrophy. Left ventricular diastolic function could  not be evaluated.   2. Right ventricular systolic function is normal. The right ventricular  size is not well visualized. There is normal pulmonary artery systolic  pressure. The estimated right ventricular systolic pressure is 24.5 mmHg.   3. Left atrial size was mildly dilated.   4. Right atrial size was mildly dilated.   5. The mitral valve is abnormal. Trivial mitral valve regurgitation.   6. The aortic valve has an indeterminant number of cusps. Aortic valve  regurgitation is not visualized. Aortic valve sclerosis is present, with  no evidence of aortic valve stenosis.   7. The inferior vena cava is normal in size with greater than 50%  respiratory variability, suggesting right atrial pressure of 3 mmHg.   CHA2DS2-VASc Score = 6  The patient's score is based upon: CHF History: 0 HTN History: 1 Diabetes History: 1 Stroke History: 0 Vascular Disease History: 1 Age Score: 2 Gender Score: 1       ASSESSMENT AND  PLAN: Paroxysmal Atrial Flutter The patient's CHA2DS2-VASc score is 6, indicating a 9.7% annual risk of stroke.    Patient is currently in NSR.  Continue Lopressor  25 mg twice daily.  She has had overall low A-fib burden and would like to proceed with conservative observation.  Secondary Hypercoagulable State (ICD10:  D68.69) The patient is at significant risk for stroke/thromboembolism based upon her CHA2DS2-VASc Score of 6.  Continue Apixaban  (Eliquis ).  Continue Eliquis  2.5 mg twice daily.  Dosage is correct based on age greater than 51 years old and weight less than 60 kg. Emphasis placed on compliance. Daughter in room will help go over medication adherence with patient.     Follow up as scheduled with cardiologist. Follow up September Afib clinic.   Terra Pac, PA-C  Af[facility] [address] [place], [place] [address] [phone]  "

## 2024-11-08 NOTE — Patient Instructions (Signed)
 Follow up in September 2026 - will send reminder letter when time to call and schedule the appointment

## 2024-12-15 ENCOUNTER — Encounter: Payer: Self-pay | Admitting: Internal Medicine

## 2024-12-16 ENCOUNTER — Ambulatory Visit: Admitting: Internal Medicine

## 2025-02-03 ENCOUNTER — Ambulatory Visit: Admitting: Internal Medicine

## 2025-02-10 ENCOUNTER — Ambulatory Visit (HOSPITAL_BASED_OUTPATIENT_CLINIC_OR_DEPARTMENT_OTHER): Admitting: Cardiovascular Disease
